# Patient Record
Sex: Male | Born: 1965 | Race: White | Hispanic: No | Marital: Married | Smoking: Former smoker
Health system: Southern US, Community
[De-identification: ages and names within clinical notes are randomized; demographics above are authoritative.]

## PROBLEM LIST (undated history)

## (undated) DIAGNOSIS — I209 Angina pectoris, unspecified: Secondary | ICD-10-CM

## (undated) DIAGNOSIS — I219 Acute myocardial infarction, unspecified: Secondary | ICD-10-CM

## (undated) DIAGNOSIS — I420 Dilated cardiomyopathy: Secondary | ICD-10-CM

## (undated) DIAGNOSIS — E785 Hyperlipidemia, unspecified: Secondary | ICD-10-CM

## (undated) DIAGNOSIS — I10 Essential (primary) hypertension: Secondary | ICD-10-CM

## (undated) DIAGNOSIS — I25119 Atherosclerotic heart disease of native coronary artery with unspecified angina pectoris: Secondary | ICD-10-CM

## (undated) DIAGNOSIS — I2121 ST elevation (STEMI) myocardial infarction involving left circumflex coronary artery: Secondary | ICD-10-CM

## (undated) DIAGNOSIS — Z95810 Presence of automatic (implantable) cardiac defibrillator: Secondary | ICD-10-CM

## (undated) DIAGNOSIS — I739 Peripheral vascular disease, unspecified: Secondary | ICD-10-CM

## (undated) DIAGNOSIS — I469 Cardiac arrest, cause unspecified: Secondary | ICD-10-CM

## (undated) DIAGNOSIS — Z955 Presence of coronary angioplasty implant and graft: Secondary | ICD-10-CM

## (undated) DIAGNOSIS — F419 Anxiety disorder, unspecified: Secondary | ICD-10-CM

## (undated) DIAGNOSIS — M7750 Other enthesopathy of unspecified foot: Secondary | ICD-10-CM

## (undated) DIAGNOSIS — I5042 Chronic combined systolic (congestive) and diastolic (congestive) heart failure: Secondary | ICD-10-CM

## (undated) DIAGNOSIS — M1990 Unspecified osteoarthritis, unspecified site: Secondary | ICD-10-CM

## (undated) HISTORY — PX: CARDIAC CATHETERIZATION: SHX172

## (undated) HISTORY — PX: APPENDECTOMY: SHX54

---

## 1994-05-05 DIAGNOSIS — I219 Acute myocardial infarction, unspecified: Secondary | ICD-10-CM

## 1994-05-05 HISTORY — DX: Acute myocardial infarction, unspecified: I21.9

## 2003-11-11 ENCOUNTER — Emergency Department (HOSPITAL_COMMUNITY): Admission: EM | Admit: 2003-11-11 | Discharge: 2003-11-12 | Payer: Self-pay | Admitting: Emergency Medicine

## 2008-06-15 ENCOUNTER — Inpatient Hospital Stay (HOSPITAL_COMMUNITY): Admission: EM | Admit: 2008-06-15 | Discharge: 2008-06-20 | Payer: Self-pay | Admitting: Emergency Medicine

## 2008-06-16 ENCOUNTER — Encounter (INDEPENDENT_AMBULATORY_CARE_PROVIDER_SITE_OTHER): Payer: Self-pay | Admitting: Emergency Medicine

## 2008-06-22 ENCOUNTER — Encounter: Admission: RE | Admit: 2008-06-22 | Discharge: 2008-06-22 | Payer: Self-pay | Admitting: Internal Medicine

## 2008-06-29 ENCOUNTER — Encounter (HOSPITAL_COMMUNITY): Admission: RE | Admit: 2008-06-29 | Discharge: 2008-07-18 | Payer: Self-pay | Admitting: Cardiology

## 2008-08-16 ENCOUNTER — Encounter: Admission: RE | Admit: 2008-08-16 | Discharge: 2008-08-16 | Payer: Self-pay | Admitting: Cardiology

## 2008-08-23 ENCOUNTER — Ambulatory Visit (HOSPITAL_COMMUNITY): Admission: RE | Admit: 2008-08-23 | Discharge: 2008-08-23 | Payer: Self-pay | Admitting: Cardiology

## 2008-09-02 HISTORY — PX: CAROTID ENDARTERECTOMY: SUR193

## 2008-09-05 ENCOUNTER — Ambulatory Visit: Payer: Self-pay | Admitting: Vascular Surgery

## 2008-09-08 ENCOUNTER — Ambulatory Visit: Payer: Self-pay | Admitting: Vascular Surgery

## 2008-09-08 ENCOUNTER — Inpatient Hospital Stay (HOSPITAL_COMMUNITY): Admission: RE | Admit: 2008-09-08 | Discharge: 2008-09-09 | Payer: Self-pay | Admitting: Vascular Surgery

## 2008-09-08 ENCOUNTER — Encounter: Payer: Self-pay | Admitting: Vascular Surgery

## 2008-10-03 ENCOUNTER — Ambulatory Visit: Payer: Self-pay | Admitting: Vascular Surgery

## 2010-08-13 LAB — COMPREHENSIVE METABOLIC PANEL
Albumin: 4.4 g/dL (ref 3.5–5.2)
BUN: 15 mg/dL (ref 6–23)
CO2: 27 mEq/L (ref 19–32)
Calcium: 9.2 mg/dL (ref 8.4–10.5)
Creatinine, Ser: 0.8 mg/dL (ref 0.4–1.5)
GFR calc Af Amer: >60 mL/min (ref >60)
GFR calc non Af Amer: >60 mL/min (ref >60)
Glucose, Bld: 99 mg/dL (ref 70–99)
Total Bilirubin: 0.4 mg/dL (ref 0.3–1.2)

## 2010-08-13 LAB — CBC
HCT: 45.3 % (ref 39.0–52.0)
Hemoglobin: 15.6 g/dL (ref 13.0–17.0)
MCHC: 34.4 g/dL (ref 30.0–36.0)
MCV: 87.4 fL (ref 78.0–100.0)
RBC: 4.25 MIL/uL (ref 4.22–5.81)
RBC: 5.18 MIL/uL (ref 4.22–5.81)
WBC: 9.5 10*3/uL (ref 4.0–10.5)

## 2010-08-13 LAB — CROSSMATCH: ABO/RH(D): AB POS

## 2010-08-13 LAB — GLUCOSE, CAPILLARY
Glucose-Capillary: 153 mg/dL — ABNORMAL HIGH (ref 70–99)
Glucose-Capillary: 162 mg/dL — ABNORMAL HIGH (ref 70–99)
Glucose-Capillary: 165 mg/dL — ABNORMAL HIGH (ref 70–99)
Glucose-Capillary: 169 mg/dL — ABNORMAL HIGH (ref 70–99)
Glucose-Capillary: 170 mg/dL — ABNORMAL HIGH (ref 70–99)
Glucose-Capillary: 175 mg/dL — ABNORMAL HIGH (ref 70–99)

## 2010-08-13 LAB — URINE MICROSCOPIC-ADD ON

## 2010-08-13 LAB — BASIC METABOLIC PANEL
BUN: 8 mg/dL (ref 6–23)
Calcium: 8.5 mg/dL (ref 8.4–10.5)
GFR calc non Af Amer: >60 mL/min (ref >60)
Glucose, Bld: 189 mg/dL — ABNORMAL HIGH (ref 70–99)
Potassium: 4.1 mEq/L (ref 3.5–5.1)
Sodium: 138 mEq/L (ref 135–145)

## 2010-08-13 LAB — URINALYSIS, ROUTINE W REFLEX MICROSCOPIC
Hgb urine dipstick: NEGATIVE
Ketones, ur: NEGATIVE mg/dL
Nitrite: NEGATIVE
Protein, ur: NEGATIVE mg/dL
Urobilinogen, UA: 1 mg/dL (ref 0.0–1.0)

## 2010-08-13 LAB — ABO/RH: ABO/RH(D): AB POS

## 2010-08-14 LAB — GLUCOSE, CAPILLARY: Glucose-Capillary: 150 mg/dL — ABNORMAL HIGH (ref 70–99)

## 2010-08-15 LAB — GLUCOSE, CAPILLARY
Glucose-Capillary: 162 mg/dL — ABNORMAL HIGH (ref 70–99)
Glucose-Capillary: 166 mg/dL — ABNORMAL HIGH (ref 70–99)
Glucose-Capillary: 173 mg/dL — ABNORMAL HIGH (ref 70–99)

## 2010-08-20 LAB — CBC
HCT: 47.3 % (ref 39.0–52.0)
HCT: 38.9 % — ABNORMAL LOW (ref 39.0–52.0)
Hemoglobin: 16.6 g/dL (ref 13.0–17.0)
Hemoglobin: 13.6 g/dL (ref 13.0–17.0)
MCHC: 35.1 g/dL (ref 30.0–36.0)
MCHC: 35.4 g/dL (ref 30.0–36.0)
MCV: 84.4 fL (ref 78.0–100.0)
MCV: 84.8 fL (ref 78.0–100.0)
MCV: 85 fL (ref 78.0–100.0)
Platelets: 133 10*3/uL — ABNORMAL LOW (ref 150–400)
Platelets: 142 10*3/uL — ABNORMAL LOW (ref 150–400)
Platelets: 160 K/uL (ref 150–400)
RBC: 4.51 MIL/uL (ref 4.22–5.81)
RBC: 4.67 MIL/uL (ref 4.22–5.81)
RBC: 4.78 MIL/uL (ref 4.22–5.81)
RBC: 5.58 MIL/uL (ref 4.22–5.81)
RDW: 12.6 % (ref 11.5–15.5)
RDW: 12.2 % (ref 11.5–15.5)
WBC: 10.9 10*3/uL — ABNORMAL HIGH (ref 4.0–10.5)
WBC: 8.1 10*3/uL (ref 4.0–10.5)
WBC: 9.2 K/uL (ref 4.0–10.5)

## 2010-08-20 LAB — GLUCOSE, CAPILLARY
Glucose-Capillary: 140 mg/dL — ABNORMAL HIGH (ref 70–99)
Glucose-Capillary: 160 mg/dL — ABNORMAL HIGH (ref 70–99)
Glucose-Capillary: 181 mg/dL — ABNORMAL HIGH (ref 70–99)
Glucose-Capillary: 185 mg/dL — ABNORMAL HIGH (ref 70–99)
Glucose-Capillary: 193 mg/dL — ABNORMAL HIGH (ref 70–99)
Glucose-Capillary: 257 mg/dL — ABNORMAL HIGH (ref 70–99)
Glucose-Capillary: 258 mg/dL — ABNORMAL HIGH (ref 70–99)
Glucose-Capillary: 283 mg/dL — ABNORMAL HIGH (ref 70–99)
Glucose-Capillary: 287 mg/dL — ABNORMAL HIGH (ref 70–99)
Glucose-Capillary: 301 mg/dL — ABNORMAL HIGH (ref 70–99)
Glucose-Capillary: 304 mg/dL — ABNORMAL HIGH (ref 70–99)
Glucose-Capillary: 348 mg/dL — ABNORMAL HIGH (ref 70–99)
Glucose-Capillary: 239 mg/dL — ABNORMAL HIGH (ref 70–99)

## 2010-08-20 LAB — POCT I-STAT, CHEM 8
Glucose, Bld: 414 mg/dL — ABNORMAL HIGH (ref 70–99)
HCT: 52 % (ref 39.0–52.0)
Hemoglobin: 17.7 g/dL — ABNORMAL HIGH (ref 13.0–17.0)
Potassium: 4.1 mEq/L (ref 3.5–5.1)
Sodium: 137 mEq/L (ref 135–145)

## 2010-08-20 LAB — COMPREHENSIVE METABOLIC PANEL
ALT: 21 U/L (ref 0–53)
ALT: 28 U/L (ref 0–53)
AST: 14 U/L (ref 0–37)
AST: 17 U/L (ref 0–37)
Albumin: 3 g/dL — ABNORMAL LOW (ref 3.5–5.2)
Alkaline Phosphatase: 55 U/L (ref 39–117)
Alkaline Phosphatase: 49 U/L (ref 39–117)
BUN: 11 mg/dL (ref 6–23)
BUN: 10 mg/dL (ref 6–23)
CO2: 24 mEq/L (ref 19–32)
CO2: 25 mEq/L (ref 19–32)
CO2: 27 mEq/L (ref 19–32)
CO2: 30 mEq/L (ref 19–32)
Calcium: 8.1 mg/dL — ABNORMAL LOW (ref 8.4–10.5)
Chloride: 103 mEq/L (ref 96–112)
Chloride: 104 mEq/L (ref 96–112)
Chloride: 108 mEq/L (ref 96–112)
Chloride: 103 mEq/L (ref 96–112)
Creatinine, Ser: 0.62 mg/dL (ref 0.4–1.5)
GFR calc Af Amer: >60 mL/min (ref >60)
GFR calc Af Amer: >60 mL/min (ref >60)
GFR calc non Af Amer: >60 mL/min (ref >60)
GFR calc non Af Amer: >60 mL/min (ref >60)
GFR calc non Af Amer: >60 mL/min (ref >60)
GFR calc non Af Amer: >60 mL/min (ref >60)
Glucose, Bld: 227 mg/dL — ABNORMAL HIGH (ref 70–99)
Potassium: 4 mEq/L (ref 3.5–5.1)
Sodium: 134 mEq/L — ABNORMAL LOW (ref 135–145)
Sodium: 136 mEq/L (ref 135–145)
Total Bilirubin: 0.4 mg/dL (ref 0.3–1.2)
Total Bilirubin: 0.5 mg/dL (ref 0.3–1.2)
Total Bilirubin: 0.5 mg/dL (ref 0.3–1.2)
Total Bilirubin: 0.6 mg/dL (ref 0.3–1.2)
Total Protein: 5.5 g/dL — ABNORMAL LOW (ref 6.0–8.3)

## 2010-08-20 LAB — BASIC METABOLIC PANEL
BUN: 9 mg/dL (ref 6–23)
CO2: 26 mEq/L (ref 19–32)
Chloride: 105 mEq/L (ref 96–112)
Creatinine, Ser: 0.74 mg/dL (ref 0.4–1.5)
Glucose, Bld: 282 mg/dL — ABNORMAL HIGH (ref 70–99)

## 2010-08-20 LAB — LIPID PANEL
Cholesterol: 282 mg/dL — ABNORMAL HIGH (ref 0–200)
Total CHOL/HDL Ratio: 8.5 RATIO

## 2010-08-20 LAB — HEMOGLOBIN A1C: Hgb A1c MFr Bld: 13.1 % — ABNORMAL HIGH (ref 4.6–6.1)

## 2010-08-20 LAB — D-DIMER, QUANTITATIVE: D-Dimer, Quant: 0.26 ug/mL-FEU (ref 0.00–0.48)

## 2010-08-20 LAB — BRAIN NATRIURETIC PEPTIDE: Pro B Natriuretic peptide (BNP): <30 pg/mL (ref 0.0–100.0)

## 2010-08-20 LAB — CARDIAC PANEL(CRET KIN+CKTOT+MB+TROPI)
CK, MB: 0.8 ng/mL (ref 0.3–4.0)
Relative Index: INVALID (ref 0.0–2.5)
Relative Index: INVALID (ref 0.0–2.5)
Total CK: 59 U/L (ref 7–232)
Total CK: 87 U/L (ref 7–232)

## 2010-08-20 LAB — PROTIME-INR: Prothrombin Time: 12.7 seconds (ref 11.6–15.2)

## 2010-08-20 LAB — RAPID URINE DRUG SCREEN, HOSP PERFORMED
Benzodiazepines: NOT DETECTED
Cocaine: NOT DETECTED
Tetrahydrocannabinol: NOT DETECTED

## 2010-08-20 LAB — POCT CARDIAC MARKERS: CKMB, poc: <1 ng/mL — ABNORMAL LOW (ref 1.0–8.0)

## 2010-09-17 NOTE — Cardiovascular Report (Signed)
Douglas Carr, Douglas Carr           ACCOUNT NO.:  [id number]   MEDICAL RECORD NO.:  [id number]          PATIENT TYPE:  AMB   LOCATION:  SDS                          FACILITY:  [facility]   PHYSICIAN:  Vonna Kotyk R. Jacinto Halim, MD       DATE OF BIRTH:  04-18-1966   DATE OF PROCEDURE:  DATE OF DISCHARGE:  08/23/2008                            CARDIAC CATHETERIZATION   PROCEDURE PERFORMED:  1. Arch aortogram.  2. Selective internal carotid arteriogram, both intracranial and      extracranial.  3. Right vertebral arteriogram and left subclavian arteriogram.   Procedure performed by Dr. Yates Decamp, assisted by Dr. Nanetta Batty.   INDICATIONS:  Douglas Carr is a 45 year old gentleman with  known coronary artery disease.  He has triple severe small vessel  coronary artery disease which are diabetic vessels by cardiac  catheterization in February 2010 for unstable angina.  He is on medical  therapy only.  He continues to have mild exertional angina pectoris, and  he is on aggressive medical therapy.  Because of carotid bruit, he  underwent carotid Dopplers which had revealed a high-grade stenosis of  the right internal carotid artery and a moderate-sized left internal  carotid artery and an occluded left vertebral artery.  He is now brought  to the catheterization lab for cerebral arteriography to confirm the  severity of stenoses.   Arch aortogram:  Arch aortogram revealed a type A arch.   Right carotid artery:  The right external carotid artery was widely  patent with mild luminal irregularity.  The right internal carotid  artery showed a focal 95% high-grade stenoses.   Left carotid artery:  The left common carotid artery was widely patent  with mild luminal irregularity.  Left internal carotid artery at its  origin was widely patent with mild luminal irregularity; however, above  the angle of the jaw there was a napkin-ring-like 70% stenoses.   Intracerebral arteriogram of the carotid  artery.  The intracerebral  arteriogram of the carotid artery showed no evidence of any thrombotic  occlusions or aneurysms.  All the vessels appeared normal.   Right vertebral arteriogram:  Right vertebral arteriogram revealed  widely patent right vertebral artery.   The intracranial portion in the posterior circulation was not adequately  visualized because of nonselective engagement of the right vertebral  artery and hence cannot be completely evaluated.   Left vertebral arteriogram:  Left vertebral artery is occluded.  Left  subclavian arteriogram revealed retrograde filling of the left vertebral  artery.   IMPRESSION:  1. High-grade stenosis of the right internal carotid artery.  2. Napkin-ring-like 70% or higher percent stenosis in the left      internal carotid artery above the angle of the jaw, not amenable      for surgical revascularization.  3. Severe small vessel coronary artery disease in a diabetic patient.   RECOMMENDATIONS:  Perform outpatient stress Myoview.  If he has got  significant lateral wall ischemia because of inability to revascularize  his coronary because of small vessels, he will be considered a high risk  for carotid endarterectomy and may  be a carotid stenting candidate into  the right.  I will evaluate the cerebral anatomy with a Dr. Nanetta Batty and also Dr. Liliane Bade.  The left internal carotid artery may  need to be stented because of high-grade stenosis although albeit he is  asymptomatic.   As far as the vertebral artery is concerned, it appears to be widely  patent.  The right vertebral artery is widely patent.  Left vertebral  artery is occluded.  Medical therapy only is advised, and he is  presently asymptomatic from that aspect.   Continued aggressive risk modification is indicated.   TECHNIQUE OF PROCEDURE:  Under usual sterile precautions using a 5-  French right femoral artery access, a 5-French pigtail catheter was  advanced to  the arch of the aorta and arch aortogram was performed in  the LAO projection.   Using [employer] as a wire, I was able to advance a JB-1 catheter to  perform selective 4-vessel cerebral arteriography.  The catheter was  then pulled out of the body in the usual fashion.  The patient tolerated  the procedure.  No immediate complications were noted.      Cristy Hilts. Jacinto Halim, MD  Electronically Signed     Cristy Hilts. Jacinto Halim, MD  Electronically Signed    JRG/MEDQ  D:  08/23/2008  T:  08/24/2008  Job:  [id number]   cc:   [facility].

## 2010-09-17 NOTE — Consult Note (Signed)
NAMEALBERTA, Douglas Carr           ACCOUNT NO.:  [id number]   MEDICAL RECORD NO.:  [id number]          PATIENT TYPE:  AMB   LOCATION:  SDS                          FACILITY:  [facility]   PHYSICIAN:  Vonna Kotyk R. Jacinto Halim, MD       DATE OF BIRTH:  16-Mar-1966   DATE OF CONSULTATION:  08/22/2008  DATE OF DISCHARGE:                                 CONSULTATION   REASON FOR OFFICE VISIT:  Follow up of coronary artery disease and  follow-up of carotid duplex.   HISTORY:  Douglas Carr is a pleasant 45 year old gentleman with history  of posterior wall myocardial infarction in July 1996.  He has recently  undergone cardiac catheterization for unstable angina on June 15, 2008 and was found to have diffuse severe coronary artery disease.  He  was recommended for medical therapy only given severe diffuse nature of  his coronary disease.   He now denies any chest pain, shortness of breath, paroxysmal nocturnal  dyspnea or orthopnea.   REVIEW OF SYSTEMS:  He has diabetes which is uncontrolled but getting  better.  He does have erectile dysfunction.  He has no significant bowel  or bladder dysfunction.  There is no neurologic weakness.  No TIA or  other symptoms.   PRESENT MEDICATIONS:  Lantus 30 units subcu q.h.s., NovoLog  7 units  with meals, Imdur 60 mg p.o. daily, Crestor 20 mg p.o. daily, Ranexa  1000 grams p.o. b.i.d., aspirin 325 mg p.o. daily, metoprolol 25 mg p.o.  b.i.d., metformin 500 mg 2 tabs p.o. q.h.s., Zoloft 20 mg p.o. daily,  lisinopril 20 mg p.o. daily.   ALLERGIES:  NO KNOWN DRUG ALLERGIES.   PHYSICAL EXAMINATION:  He is well-built and obese.  He appears to be in  no acute distress.  His weight is 224 pounds with a BMI of 32.5, heart  rate is 72 beats per minute, respirations 14, blood pressure is 120/74  mmHg.  CARDIAC:  S1-S2 was normal without any gallops or murmur.  CHEST:  Clear.  ABDOMEN:  Soft.  EXTREMITIES:  No edema.  PERIPHERAL ARTERIAL EXAM:  He has 3+ carotids  with a very prominent  right carotid bruit more than left, 3+ femoral pulses, popliteal and  DP/PT were 2+ bilaterally.   Carotid duplex done on July 18, 2008 revealed a high-grade 70-99%  stenosis across the right internal carotid artery.  Mild disease of the  left system.  The peak velocity was 277 with a diastolic velocity of 117  cm/sec.   IMPRESSION:  1. Asymptomatic a high-grade right carotid artery stenosis by duplex      evaluation.  2. Coronary artery disease with diffuse disease which are diabetic      vessels.  He has a long segment 80% diagonal 1, 70% diagonal 2, 80%      obtuse marginal 1, and a 60% long obtuse marginal 2 disease with      mild disease in the right coronary artery with normal ejection      fraction.  3. Hyperlipidemia, controlled.  4. Erectile dysfunction.  5. Diabetes mellitus type 2, uncontrolled with  his getting better.   RECOMMENDATIONS:  I had a lengthy discussion with the patient.  Given  the fact that he has high-grade stenosis of the right internal carotid  artery, to confirm the severity of the stenosis, I have recommended that  we proceed with four-vessel cerebral angiography.  He understand there  is a percent risk of embolic complications, especially stroke with this  but not limited to these.  Infection, bleeding, and death have also been  explained to the patient.   The alternatives of CT angiography were discussed.  As the high grade  stenosis could old or underestimated, we have decided to proceed with  peripheral angiography directly.  This will be set up in a few days and  further recommendations to follow.  At this time, no changes in  medications have been done.      Douglas Hilts. Jacinto Halim, MD  Electronically Signed     JRG/MEDQ  D:  08/22/2008  T:  08/22/2008  Job:  [id number]

## 2010-09-17 NOTE — Cardiovascular Report (Signed)
Douglas Carr, HIPPE NO.:  [id number]   MEDICAL RECORD NO.:  [id number]          PATIENT TYPE:  INP   LOCATION:  2003                         FACILITY:  [facility]   PHYSICIAN:  Antonieta Iba, MD   DATE OF BIRTH:  1965/10/11   DATE OF PROCEDURE:  06/19/2008  DATE OF DISCHARGE:                            CARDIAC CATHETERIZATION   REASON FOR PROCEDURE:  Mr. Fedie is a very pleasant 45 year old  gentleman with a history of poorly controlled diabetes, long history of  smoking, hypertension, coronary artery disease with remote history of  myocardial infarction at age 41 (details uncertain), who presents with  stuttering chest pain.  It has been getting worse over the past several  weeks.  He is brought to the Cardiac Catheterization Lab for further  evaluation.   PROCEDURE IN DETAILS:  risks and benefits of the procedure were  discussed with Mr. Advincula and consent was obtained.  He was brought to  the Cardiac Catheterization Lab and prepped and draped in the usual  sterile fashion.  The modified Seldinger technique was used to engage  the right femoral artery.  A 6-French Judkins left #4 and Judkins right  #4 catheter were used to engage the left main and the RCA ostium  respectively.  Hand injections were used and cinematography recorded in  various positions.  The pigtail catheter was used to cross the aortic  valve into the LV and LV gram was recorded.  Pullback recorded gradients  cross the aortic valve.  The sheath was removed at the end of the case  and manual pressure given and hemostasis obtained.  No complications  were reported.   CORONARY ANATOMY:  Left main; Left main is a moderate-to-large size  vessel with no significant disease.  It bifurcates into the LAD and left  circumflex.   LAD; the left anterior descending is a moderate-to-large size vessel  that extends distally to the apex.  There are mild luminal  irregularities noted.  There are 3-4  small diagonal vessels noted.  Each  diagonal has moderate-to-severe disease diffusely, most notably in the  D1 and D2 vessel.   Left circumflex; the left circumflex is a moderate-sized vessel with at  least 2 obtuse marginal branches.  The OM-1 and OM-2 have moderate-to-  severe disease throughout the proximal to mid region that is diffuse in  nature.  Otherwise, the left circumflex has no notable focal significant  lesions.   Right coronary artery; it has mild disease in its proximal to mid region  estimated at 20-30%.  There is also some small disease, estimated at 40-  50% in its distal PD branches where it tapers.   LV gram shows normal LV systolic function, estimated greater than 55%.  No MR and no significant AS.   FINAL IMPRESSION:  There is severe diffuse disease of the marginal  branches off the circumflex as well as the diagonal vessels.  The  marginals and diagonals are small in caliber, likely secondary to long  history of poorly controlled diabetes.  Medical management is  recommended given that there is no 1 focal lesion to  fix.  His chest  pain is likely secondary to severe small vessel disease.  We have  strongly recommended him that he work diligently controlling his  diabetes, stay strictly on his medical regimens including a cholesterol  medication and follow up with a primary care physician as well as a  cardiologist and possibly an endocrinologist.  He will be started on  isosorbide mononitrate 60 mg daily in the hospital and be given  nitroglycerins p.r.n. to take for chest pain.  Other treatment options  include advancing his beta-blockers, even starting Ranexa though he has  commented that many of these medications are expensive to him and  difficult to afford.  He should work with his cardiologist to see if he  can  obtain some co-pay assistance cards, so he can stay on his medications.  He will follow up with one of the cardiologists at [facility] in [place] or in [place] in the next week for a  groin check following his catheterization.      Antonieta Iba, MD  Electronically Signed     TJG/MEDQ  D:  06/19/2008  T:  06/20/2008  Job:  [id number]

## 2010-09-17 NOTE — H&P (Signed)
Douglas Carr, Douglas NO.:  [id number]   MEDICAL RECORD NO.:  [id number]          PATIENT TYPE:  INP   LOCATION:  2003                         FACILITY:  [facility]   PHYSICIAN:  Maryla Morrow, MD        DATE OF BIRTH:  [birth year]   DATE OF ADMISSION:  06/15/2008  DATE OF DISCHARGE:                              HISTORY & PHYSICAL   PRIMARY CARE PHYSICIAN:  Unassigned.   CHIEF COMPLAINT:  Chest pain.   HISTORY OF PRESENT ILLNESS:  [name] is a 45 year old very pleasant  gentleman with remote history of myocardial infarction, CAD,  hypertension, diabetes, and medical noncompliance with tobacco abuse,  who presents to the ED today with sudden onset of chest pain before  suppertime while at rest.  The patient described the pain as substernal  in origin around 10/10 in intensity.  It is localized to the area  without any radiation and associated diaphoresis, but no nausea.  This  prompted him to come to the ED for further evaluation as the pain eased  up after taking his mother-in-law's nitroglycerin, aspirin.  The patient  otherwise claims that he denies any shortness of breath, cough,  congestion, lower extremity edema.  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  He currently  is not taking any medication and fortunately has quit smoking since  May 05, 2008.   CURRENT MEDICATIONS:  None.   ALLERGIES:  NO KNOWN DRUG ALLERGIES.   PAST MEDICAL HISTORY:  Significant for hypertension, diabetes in mother  side, and heart problems in the fathers side.   SOCIAL HISTORY:  The patient is a former smoker.  Quit May 05, 2008.  Denies any tobacco, alcohol, drug abuse.  He is married.  Lives with his  wife.  He is currently a Armed forces operational officer for [employer].   REVIEW OF SYSTEMS:  All pertinent positive and negative as noted in the  HPI, rest was negative for patient, complete 12-point review of systems  performed.   PAST SURGICAL HISTORY:  Negative.   FAMILY MEDICAL HISTORY:  negative for CAD or SCD.   PHYSICAL EXAMINATION:  VITAL SIGNS:  Temperature 98.6, blood pressure  206/119 which is down to 137/71.  Pulse is 97 per minute.  Respirations  16 per minute.  Blood pressure saturation 100% on room air.  HEENT:  Pupils equally round and reactive to light.  Extraocular  movements intact.  NECK:  No JVD.  No lymphadenopathy.  HEAD:  Atraumatic, normocephalic.  Oropharyngeal is clear.  CHEST:  Clear to auscultation bilaterally.  Equal expansion bilaterally.  Bilateral, there is evidence of clear lung sounds, unlabored breathing.  HEART:  Regular rate and rhythm.  S1 and S2 normal.  No murmur or  gallop.  ABDOMEN:  Soft and nontender, nondistended.  EXTREMITIES:  No edema, cyanosis, or clubbing.  Peripheral pulses are 2+  bilaterally.  Strength is 5/5 throughout.  Sensation is intact.  Speech  is normal.  SKIN:  No evidence of rash, or ulceration.  PSYCHIATRY:  Mood and affect normal.  LABORATORY DATA:  Pertinent labs include negative urine tox screen as  well as unremarkable CBC, negative cardiac enzymes, and chest x-ray  reveal lung nodules particularly in the right base.  If possible get a  scan, consider CT chest for further assessment.  A 12-EKG was normal  sinus rhythm with normal PR, QT intervals, as well as no evidence of any  ST-T changes.   ASSESSMENT AND PLAN:  1. Unstable angina with history of myocardial infarction and coronary      artery disease in the past.  2. Uncontrolled hypertension.  This is uncontrolled.  3. Insulin dependent diabetes mellitus.  4. History of tobacco abuse.  5. Possible metabolic syndrome.  6. Lung nodules on chest x-ray.   RECOMMENDATION:  We will cycle cardiac enzymes x2 sets and place the  patient on full-dose Lovenox, beta-blocker, ACE inhibitor, aspirin and  nitroglycerin.  We will also do 2D echocardiogram morning and place the   patient on IV insulin, start statin.  We will consider counseling  Cardiology in the a.m. for left heart cath, as the patient is at risk  for worsening coronary artery disease.  The patient may also need a CT  chest for full workup of his lung nodules, should diagnose on chest x-  ray.      Maryla Morrow, MD  Electronically Signed     AP/MEDQ  D:  06/16/2008  T:  06/16/2008  Job:  [id number]

## 2010-09-17 NOTE — Op Note (Signed)
NAMEMARQUAN, VOKES NO.:  [id number]   MEDICAL RECORD NO.:  [id number]          PATIENT TYPE:  INP   LOCATION:  3303                         FACILITY:  [facility]   PHYSICIAN:  Quita Skye. Hart Rochester, M.D.  DATE OF BIRTH:  10-28-1965   DATE OF PROCEDURE:  09/08/2008  DATE OF DISCHARGE:                               OPERATIVE REPORT   PREOPERATIVE DIAGNOSIS:  Severe right internal carotid stenosis -  asymptomatic.   POSTOPERATIVE DIAGNOSIS:  Severe right internal carotid stenosis -  asymptomatic.   OPERATION:  Right carotid endarterectomy with Dacron patch angioplasty.   SURGEON:  Quita Skye. Hart Rochester, MD   FIRST ASSISTANT:  Nurse.   ANESTHESIA:  General endotracheal.   BRIEF HISTORY:  This patient with severe coronary artery disease having  suffered a myocardial infarction 12 years ago now presents with evidence  of severe right carotid occlusive disease confirmed by duplex scanning.  He has mild carotid disease on the contralateral left side scheduled for  a right carotid endarterectomy for this is asymptomatic stenosis.  He is  not an operative candidate for his coronary artery disease but has good  ventricular function.   PROCEDURE IN DETAIL:  The patient was taken to the operating room and  placed in a supine position at which time satisfactory general  endotracheal anesthesia was administered.  Right neck was prepped with  Betadine scrub and solution and draped in routine sterile manner.  Incision was made along the anterior border of the sternocleidomastoid  muscle and carried down through subcutaneous tissue and platysma using  Bovie.  Common facial vein and external jugular veins were ligated with  3-0 silk ties and divided exposing the common internal and external  carotid arteries.  Care was taken not to injure the vagus or hypoglossal  nerves both of which were exposed.  There was a calcified  atherosclerotic plaque at the carotid bifurcation extending up  the  internal carotid artery about 3 cm and distal vessel appeared normal.  A  #10 shunt was prepared and the patient was heparinized.  Carotid vessels  were occluded with vascular clamps.  A longitudinal opening made in the  common carotid with #15 blade and extended up the internal carotid with  Potts scissors to a point distal to the disease.  The plaque was at  least 90-95% stenotic in severity and distal vessel appeared normal.  A  #10 shunt was inserted without difficulty reestablishing the flow in  about 2 minutes.  Standard endarterectomy was then performed using the  elevator and Potts scissors with eversion endarterectomy of the external  carotid.  Plaque feathered off distal internal carotid artery nicely not  requiring any tacking sutures.  The lumen was thoroughly irrigated with  heparin saline.  All loose debris was carefully removed and arteriotomy  was closed with a patch using continuous 6-0 Prolene.  Prior to  completion of the closure, shunt was removed after about 30 minutes  shunt time following antegrade and retrograde flushing.  Closure was  completed reestablishing the flow initially up external and up the  internal branch.  Carotid was occluded for  less  than 2 minutes for removal of shunt.  Protamine was then given to  reverse the heparin.  Following adequate hemostasis, the wound was  irrigated with saline and closed in layers with Vicryl in subcuticular  fashion.  Sterile dressing was applied.  The patient was taken to the  recovery room in satisfactory condition.      Quita Skye Hart Rochester, M.D.  Electronically Signed     JDL/MEDQ  D:  09/08/2008  T:  09/09/2008  Job:  [id number]

## 2010-09-17 NOTE — Assessment & Plan Note (Signed)
OFFICE VISIT   Douglas Carr, Douglas Carr  DOB:  January 10, 1966                                       10/03/2008  BJYNW#:[id number]   The patient is status post right carotid endarterectomy on May 7 for a  severe but asymptomatic right internal carotid stenosis.  The patient  also has significant coronary artery disease and is not an operative  candidate for that but has good left ventricular function.  He has done  well since his carotid surgery with no neurologic complications or  specific complaints.  He is swallowing well and has had no hoarseness.  Denies any hemispheric or nonhemispheric TIAs.  He is taking his aspirin  daily (325 mg).   On exam today blood pressure is 132/66, heart rate is 96, respirations  14.  Right neck incision has healed nicely.  Carotid pulses are 3+ with  no audible bruits.  Neurological:  Normal.   I think he is doing quite well and will return in 6 months for a  followup carotid duplex exam.  He has a 50% left internal carotid  stenosis which we will follow.  If he has any neurologic symptoms in the  interim he will be in touch with [place].   Quita Skye Hart Rochester, M.D.  Electronically Signed   JDL/MEDQ  D:  10/03/2008  T:  10/04/2008  Job:  [id number]

## 2010-09-17 NOTE — Discharge Summary (Signed)
Douglas Carr, Douglas Carr NO.:  [id number]   MEDICAL RECORD NO.:  [id number]          PATIENT TYPE:  INP   LOCATION:  3303                         FACILITY:  [facility]   PHYSICIAN:  Quita Skye. Hart Rochester, M.D.  DATE OF BIRTH:  07/01/1965   DATE OF ADMISSION:  09/08/2008  DATE OF DISCHARGE:  09/09/2008                               DISCHARGE SUMMARY   FINAL DISCHARGE DIAGNOSES:  1. Severe right internal carotid artery stenosis, asymptomatic.  2. Noninsulin-dependent diabetes mellitus.  3. Dyslipidemia.  4. Hypertension.   PROCEDURE PERFORMED:  On Sep 08, 2008, right carotid endarterectomy with  Dacron patch angioplasty closure by Dr. Hart Rochester.   COMPLICATIONS:  None.   CONDITION AT DISCHARGE:  Stable, improving.   DISCHARGE MEDICATIONS:  He is instructed to resume all previous  prescribed medications consisting of:  1. Ranexa 1000 mg p.o. b.i.d.  2. Metformin 500 mg p.o. at bedtime.  3. Metoprolol 25 mg p.o. b.i.d.  4. Crestor 20 mg p.o. daily.  5. Lisinopril 20 mg p.o. daily.  6. Zoloft 100 mg p.o. daily.  7. Aspirin 325 mg p.o. daily.  8. Lantus insulin 30 units subcu at bedtime.  9. Novolin insulin 7 units subcu with meals.  10.He is given a prescription for Percocet 5/325 one p.o. q.4 h.      p.r.n. pain, total #20 were given.   DISPOSITION:  He is being discharged home in stable condition with his  wounds healing well.  He is given careful instructions regarding the  care of his wounds and activity level.  He is given a return appointment  with Dr. Hart Rochester in 2 weeks for continuing followup.   BRIEF IDENTIFYING STATEMENT:  For complete details, please refer the  typed history and physical.  Briefly, this very pleasant 45 year old  gentleman was referred to Dr. Hart Rochester for asymptomatic narrowing of his  right internal carotid artery.  Upon Dr. Candie Chroman evaluation, determined  this to be a rather severe narrowing and he recommended right carotid  endarterectomy for  his stroke prevention.  Mr. Noll was informed of  the risks and benefits of the procedure and after careful consideration,  he elected to proceed with surgery.   HOSPITAL COURSE:  Preoperative workup was completed as an outpatient.  He was brought in through same-day surgery and underwent the  aforementioned right carotid endarterectomy.  For complete details,  please refer the typed operative report.  The procedure was without  complications.  He was returned to the Post Anesthesia Care Unit  extubated.  Following stabilization, he was transferred to a bed on a  surgical step-down unit.  He was observed  overnight.  The following morning, he was found to be stable.  His  tongue was midline.  He was neurologically intact.  His wounds were  healing well.  He was desirous of discharge home and he was discharged  in stable condition.      Wilmon Arms, PA      Quita Skye Hart Rochester, M.D.  Electronically Signed    KEL/MEDQ  D:  09/09/2008  T:  09/10/2008  Job:  [id number]  cc:   Quita Skye. Hart Rochester, M.D.

## 2010-09-17 NOTE — H&P (Signed)
HISTORY AND PHYSICAL EXAMINATION   Sep 05, 2008   Re:  Douglas Carr, Douglas Carr                 DOB:  12/29/1965   CHIEF COMPLAINT:  Severe right internal carotid stenosis - asymptomatic.   HISTORY OF PRESENT ILLNESS:  This 45 year old male patient with a long  history of coronary artery disease suffered a myocardial infarction in  1996.  He underwent angiography and apparently was found to have diffuse  small vessel disease.  He was relatively stable for the next 12-14 years  but developed some chest pain in February of 2010 and was again admitted  for unstable angina pectoris.  Cardiac cath revealed severe distal  disease not amenable to intervention.  He apparently had a Cardiolite  study performed 2 weeks ago which revealed good ventricular function and  no evidence of ischemia.  He has stable angina pectoris.  He was also  found to have significant carotid occlusive disease and a right carotid  angiogram revealed 95+ percent focal right internal carotid stenosis.  He has no history stroke, TIAs, amaurosis fugax, diplopia, blurred  vision or syncope.   PAST MEDICAL HISTORY:  1. Diabetes now insulin dependent since February of 2010.  2. Hypertension.  3. Hyperlipidemia.  4. Coronary artery disease.  Previous myocardial infarction now      stable.  5. Negative for stroke.   PAST SURGICAL HISTORY:  Appendectomy.   FAMILY HISTORY:  Positive for diabetes in his mother.  Negative for  coronary artery disease except in grandparents.  Negative for stroke.   SOCIAL HISTORY:  He is married, has three children and is a Sales promotion account executive.  Smokes a pack of cigarettes per day and has done so for 25+  years.  Does not use alcohol.   REVIEW OF SYSTEMS:  Positive for occasional chest discomfort, dyspnea on  exertion, lower extremity discomfort with walking, headaches, muscle  pain.   ALLERGIES:  None known.   MEDICATIONS:  1. Ranexa 1000 mg one in the morning and one  at night.  2. Metformin 500 mg two at night.  3. Metoprolol 25 mg one in the morning and one at night.  4. Crestor 20 mg one in the morning.  5. Lisinopril 20 mg one in the morning.  6. Zoloft 100 mg one in the morning.  7. Aspirin 325 mg one in the morning.  8. Lantus 30 units at night.  9. Novolin 7 units with meals.   PHYSICAL EXAM:  Vital signs:  Blood pressure is 129/82, heart rate is  88, respirations 14.  General:  He is a middle-aged male in no apparent  distress, alert and oriented x3.  Neck:  Supple.  3+ carotid pulses.  Soft bruit on the right.  Neurologic:  Normal.  No palpable adenopathy  in the neck.  Chest:  Clear to auscultation.  Cardiovascular:  Regular  rhythm.  No murmurs.  Abdomen:  Soft, nontender with no masses.  He has  3+ femoral and popliteal pulses bilaterally and well-perfused lower  extremities.   IMPRESSION:  1. Severe 99% right internal carotid stenosis - asymptomatic.  2. Coronary artery disease with small distal disease not amenable to      intervention currently stable with negative Cardiolite.   PLAN:  Is to admit the patient on May 7 for an elective right carotid  endarterectomy.  Risks and benefits have been thoroughly discussed with  the patient and he would  like to proceed.   Quita Skye Hart Rochester, M.D.  Electronically Signed   JDL/MEDQ  D:  09/05/2008  T:  09/06/2008  Job:  [id number]   cc:   Cristy Hilts. Jacinto Halim, MD  Ernestina Penna, M.D.

## 2010-09-17 NOTE — Procedures (Signed)
CAROTID DUPLEX EXAM   INDICATION:  Follow up carotid artery disease.   HISTORY:  Diabetes:  Yes.  Cardiac:  MI.  Hypertension:  Yes.  Smoking:  Yes.  Previous Surgery:  No.  CV History:  No.  Amaurosis Fugax No, Paresthesias No, Hemiparesis No.                                       RIGHT             LEFT  Brachial systolic pressure:         138               132  Brachial Doppler waveforms:         WNL               WNL  Vertebral direction of flow:        Possible occlusion  DUPLEX VELOCITIES (cm/sec)  CCA peak systolic                   127  ECA peak systolic                   306  ICA peak systolic                   505  ICA end diastolic                   208  PLAQUE MORPHOLOGY:                  Mixed  PLAQUE AMOUNT:                      Severe  PLAQUE LOCATION:                    ICA/ECA   IMPRESSION:  1. Limited right side only study.  2. Right ICA shows evidence of 80% to 99% stenosis.  3. Right ECA stenosis.  4. Right vertebral artery possible occlusion.   ___________________________________________  Quita Skye Hart Rochester, M.D.   AS/MEDQ  D:  09/05/2008  T:  09/05/2008  Job:  [id number]

## 2010-09-17 NOTE — Discharge Summary (Signed)
[name], Douglas Carr NO.:  [id number]   MEDICAL RECORD NO.:  [id number]          PATIENT TYPE:  INP   LOCATION:  2003                         FACILITY:  [facility]   PHYSICIAN:  Richarda Overlie, MD       DATE OF BIRTH:  [birth year]   DATE OF ADMISSION:  06/15/2008  DATE OF DISCHARGE:  06/20/2008                               DISCHARGE SUMMARY   DISCHARGE DIAGNOSES:  1. Coronary artery disease with severe diffuse disease.  2. Uncontrolled diabetes.  3. Hypertension.  4. Dyslipidemia.  5. Pulmonary nodules, likely granulomatous lesions.   PROCEDURES:  1. Cardiac catheterization on June 19, 2008, that showed severe      diffuse disease of the marginal branches of the circumflex, as well      as the diagonal vessels.  The marginals and the diagonals are small      in caliber, likely secondary to long history of poorly controlled      diabetes.  Medical management recommended as there is no one focal      fixed lesion.  2. CT scan of the chest with contrast shows old granulomatous disease      with calcified right middle lobe nodules and calcified splenic      granulomas, segmental atelectasis in the lower lung zones.  3. Chest x-ray showed lung nodules particularly at the right lung      base.  4. CT scan of the head without contrast showed no evidence of any      intracranial hemorrhage or brain edema.   SUBJECTIVE:  This is a 45 year old male with a remote history of  myocardial infarction, coronary artery disease, hypertension, diabetes,  medical noncompliance and tobacco abuse who presented to the ER with a  chief complaint of chest pain that developed at rest.  The pain is  substernal in origin, 10/10 in intensity.  It was localized to the  substernal area without radiation, but associated with diaphoresis.  The  patient's pain eased up after taking sublingual nitroglycerin and  aspirin.  Because of the patient's multiple risk factors, the patient  was  admitted for possible unstable angina, uncontrolled hypertension and  insulin-dependent diabetes.  A 2-D echocardiogram was done that showed  an ejection fraction of normal systolic function, no wall motion  abnormalities were seen, and the patient was found to moderate  concentric hypertrophy.  Because of the patient's multiple risk factors,  [facility] was consulted and the patient was  thought to be a candidate for cardiac cath.  The patient's cardiac cath  showed severe diffuse disease, not amendable to intervention, but strong  medical management was recommended.   Risk factor stratification was done and the patient's lipid panel was  checked that showed a hemoglobin. A lipid panel was checked that showed  an HDL of 33, triglycerides of 358, LDL of 177.  The patient's  hemoglobin A1c was found to be 13.1.  He was initiated on Lantus and  regular insulin.  The patient will be enrolled in our drug assistance  program and case management was help him secure his  diabetic  medications.  He is to follow up with [facility] in 5-7 days.  Follow-  up concerns; repeat CMET, lipid panel and CK in 8-12 weeks.   DISCHARGE MEDICATIONS:  1. Metoprolol 25 p.o. q.12 h.  2. Aspirin 325 p.o. daily.  3. Lantus 15 units at bedtime, regular 7 units subcu q.a.c.  4. Imdur 60 mg p.o. daily.  5. Lisinopril 10 mg p.o. daily.  6. Zocor 80 mg p.o. daily.      Richarda Overlie, MD  Electronically Signed     NA/MEDQ  D:  06/20/2008  T:  06/20/2008  Job:  [id number]

## 2011-05-06 HISTORY — PX: LUNG BIOPSY: SHX232

## 2011-05-06 HISTORY — PX: OTHER SURGICAL HISTORY: SHX169

## 2011-08-19 ENCOUNTER — Encounter (HOSPITAL_COMMUNITY): Payer: Self-pay | Admitting: Emergency Medicine

## 2011-08-19 ENCOUNTER — Emergency Department (HOSPITAL_COMMUNITY)
Admission: EM | Admit: 2011-08-19 | Discharge: 2011-08-19 | Disposition: A | Discharge status: Home / Self Care | Payer: BC Managed Care – PPO | Source: Home / Self Care | Attending: Family Medicine | Admitting: Family Medicine

## 2011-08-19 DIAGNOSIS — E119 Type 2 diabetes mellitus without complications: Secondary | ICD-10-CM

## 2011-08-19 DIAGNOSIS — G609 Hereditary and idiopathic neuropathy, unspecified: Secondary | ICD-10-CM

## 2011-08-19 DIAGNOSIS — G629 Polyneuropathy, unspecified: Secondary | ICD-10-CM

## 2011-08-19 HISTORY — DX: Essential (primary) hypertension: I10

## 2011-08-19 LAB — POCT URINALYSIS DIP (DEVICE)
Glucose, UA: 500 mg/dL — AB
Leukocytes, UA: NEGATIVE
Nitrite: NEGATIVE
Urobilinogen, UA: 0.2 mg/dL (ref 0.0–1.0)

## 2011-08-19 LAB — POCT I-STAT, CHEM 8
HCT: 45 % (ref 39.0–52.0)
Hemoglobin: 15.3 g/dL (ref 13.0–17.0)
Potassium: 3.8 mEq/L (ref 3.5–5.1)
Sodium: 136 mEq/L (ref 135–145)
TCO2: 25 mmol/L (ref 0–100)

## 2011-08-19 MED ORDER — HYDROCODONE-ACETAMINOPHEN 5-500 MG PO TABS: 1.0000 | ORAL_TABLET | Freq: Four times a day (QID) | ORAL | Status: AC | PRN

## 2011-08-19 NOTE — ED Notes (Signed)
Pt. Stated, I started having both my legs hurting 3 days ago when I got up, denies any injury.

## 2011-08-19 NOTE — Discharge Instructions (Signed)
This is not well-controlled. You need to restart your 70/30 insulin today. Can use 10 units in the morning and your regular 30 units in the evening until you see your primary care provider. My impression is that the pain in your legs is also related to poorly controlled diabetes causing what is called peripheral neuropathy. Take the prescribed medications for pain on until to discuss further chronic pain management with your primary care provider. Be aware that Vicodin can cause drowsiness and she should not take before driving or operating machinery. Your blood sugar is high above 300 today. You need to restart her insulin tonight and will need to go to the hospital emergency department if dizziness, headache, nausea, vomiting or any new symptoms.  Followup with your primary care provider tomorrow as planned.

## 2011-08-20 NOTE — ED Provider Notes (Signed)
History     CSN: [id number]  Arrival date & time 08/19/11  1918   First MD Initiated Contact with Patient 08/19/11 1923      Chief Complaint  Patient presents with  . Leg Pain    (Consider location/radiation/quality/duration/timing/severity/associated sxs/prior treatment) HPI Comments: 46 y/o smoker male with h/o poorly controlled diabetes, HTN, CAD and poor medication compliance here c/o dull bilateral lower leg pain for about 1 week worse in last 3 days. Works in [employer] but denies any injury or falls. Has had discomfort like tingling and numbness in both hands as well. His PCP recently changed his diabetes regime to 70/30 insuline once a day 30 at evening and he is not using novolog to cover his meals. Patient no checking his sugar at home admits to not taking/using any of his medications including insuline in last 3 days. Reports he is "tired of injecting every day" and that "stopped taking everything as was not feeling well". Denies chest pain, shortness of breath, dizziness, abdominal pain, nausea, vomiting or diarrhea.    Past Medical History  Diagnosis Date  . Diabetes mellitus   . Hypertension   . Coronary artery disease     History reviewed. No pertinent past surgical history.  No family history on file.  History  Substance Use Topics  . Smoking status: Current Everyday Smoker -- 2.0 packs/day    Types: Cigarettes  . Smokeless tobacco: Not on file  . Alcohol Use: 0.6 oz/week    1 Cans of beer per week      Review of Systems  Constitutional: Negative for fever, chills, diaphoresis and appetite change.  HENT: Negative for congestion and sore throat.   Respiratory: Negative for cough and shortness of breath.   Cardiovascular: Negative for chest pain and palpitations.       Ankle swelling intermitently  Gastrointestinal: Negative for nausea, vomiting, abdominal pain, diarrhea and abdominal distention.  Musculoskeletal: Positive for arthralgias.  Skin:  Negative for rash.  Neurological: Negative for dizziness and headaches.  Psychiatric/Behavioral: Negative for suicidal ideas.    Allergies  Review of patient's allergies indicates no known allergies.  Home Medications   Current Outpatient Rx  Name Route Sig Dispense Refill  . ASPIRIN 325 MG PO TABS Oral Take 325 mg by mouth daily.    Marland Kitchen CLOPIDOGREL BISULFATE 75 MG PO TABS Oral Take 25 mg by mouth daily.    Marland Kitchen ESCITALOPRAM OXALATE 20 MG PO TABS Oral Take 20 mg by mouth daily.    . INSULIN ASPART 100 UNIT/ML [place] SOLN Subcutaneous Inject 30 Units into the skin 3 (three) times daily before meals.    Marland Kitchen LISINOPRIL 20 MG PO TABS Oral Take 20 mg by mouth daily.    Marland Kitchen METOPROLOL TARTRATE 25 MG PO TABS Oral Take 25 mg by mouth 2 (two) times daily.    Marland Kitchen RANOLAZINE ER 500 MG PO TB12 Oral Take 1,000 mg by mouth 2 (two) times daily.    Marland Kitchen ROSUVASTATIN CALCIUM 20 MG PO TABS Oral Take 20 mg by mouth daily.    Marland Kitchen HYDROCODONE-ACETAMINOPHEN 5-500 MG PO TABS Oral Take 1 tablet by mouth every 6 (six) hours as needed for pain. 15 tablet 0    BP 134/81  Pulse 94  Temp(Src) 98.6 F (37 C) (Oral)  Resp 20  SpO2 99%  Physical Exam  Nursing note and vitals reviewed. Constitutional: He is oriented to person, place, and time. He appears well-developed and well-nourished. No distress.  HENT:  Head: Normocephalic  and atraumatic.  Mouth/Throat: No oropharyngeal exudate.  Eyes: Conjunctivae are normal. Pupils are equal, round, and reactive to light. No scleral icterus.  Neck: Neck supple. No JVD present.  Cardiovascular: Normal rate, regular rhythm and normal heart sounds.  Exam reveals no gallop and no friction rub.   No murmur heard.      Trace bilateral ankle swelling, more right than left. Decreased but present dorsal pedial and tibial posterior pulses bilaterally. No distal cyanosis.  Pulmonary/Chest: Breath sounds normal. No respiratory distress. He has no wheezes. He exhibits no tenderness.  Abdominal:  Bowel sounds are normal. He exhibits no distension. There is no tenderness.  Musculoskeletal:       Lower extremities: diffuse tenderness. No bruising. There is mild swelling of right ankle more than left. No focal peri malleolar tenderness bilaterally. Patient is weight bearing. No calf tenderness and negative Homans sign bilaterally. Gross superficial sensation is concerved bilateral.   Lymphadenopathy:    He has no cervical adenopathy.  Neurological: He is alert and oriented to person, place, and time. He has normal reflexes.  Skin: No rash noted.  Psychiatric:       Flat affect, impress depressed mood. Here with wife. Denies suicidal ideation.    ED Course  Procedures (including critical care time)  Labs Reviewed  POCT URINALYSIS DIP (DEVICE) - Abnormal; Notable for the following:    Glucose, UA 500 (*)    Ketones, ur TRACE (*)    Hgb urine dipstick TRACE (*)    All other components within normal limits  POCT I-STAT, CHEM 8 - Abnormal; Notable for the following:    Glucose, Bld 367 (*)    All other components within normal limits  LAB REPORT - SCANNED   No results found.   1. Peripheral neuropathy   2. Diabetes mellitus       MDM  Poorly controlled DM, smoker. Impress neuropathic pain peripherally, likely related to diabetes. Hyperglycemic and non compliant with medication regime. electrolytes and vital signs stable. No vomiting or diarrhea. reccommended hydration with non sugary drinks and to restart his medications including 70/30 insuline 30 units this evening and add a 10 units in am until gets to be seen by his primary doctor. Wife states she will call PCP office in am for same day appointment. Prescribed Vicodin for pain.  Asked to go to the emergency department if new or worsening symptoms.         Sharin Grave, MD 08/20/11 [phone]

## 2011-10-07 ENCOUNTER — Encounter (HOSPITAL_COMMUNITY): Payer: Self-pay | Admitting: Pharmacy Technician

## 2011-10-09 ENCOUNTER — Other Ambulatory Visit: Payer: Self-pay | Admitting: Orthopedic Surgery

## 2011-10-13 ENCOUNTER — Encounter (HOSPITAL_COMMUNITY): Payer: Self-pay

## 2011-10-13 ENCOUNTER — Encounter (HOSPITAL_COMMUNITY)
Admission: RE | Admit: 2011-10-13 | Discharge: 2011-10-13 | Disposition: A | Discharge status: Home / Self Care | Payer: BC Managed Care – PPO | Source: Ambulatory Visit | Attending: Orthopedic Surgery | Admitting: Orthopedic Surgery

## 2011-10-13 ENCOUNTER — Ambulatory Visit (HOSPITAL_COMMUNITY)
Admission: RE | Admit: 2011-10-13 | Discharge: 2011-10-13 | Disposition: A | Discharge status: Home / Self Care | Payer: BC Managed Care – PPO | Source: Ambulatory Visit | Attending: Orthopedic Surgery | Admitting: Orthopedic Surgery

## 2011-10-13 DIAGNOSIS — Z01812 Encounter for preprocedural laboratory examination: Secondary | ICD-10-CM | POA: Insufficient documentation

## 2011-10-13 DIAGNOSIS — Z01810 Encounter for preprocedural cardiovascular examination: Secondary | ICD-10-CM | POA: Insufficient documentation

## 2011-10-13 DIAGNOSIS — J8410 Pulmonary fibrosis, unspecified: Secondary | ICD-10-CM | POA: Insufficient documentation

## 2011-10-13 DIAGNOSIS — R911 Solitary pulmonary nodule: Secondary | ICD-10-CM | POA: Insufficient documentation

## 2011-10-13 HISTORY — DX: Anxiety disorder, unspecified: F41.9

## 2011-10-13 HISTORY — DX: Unspecified osteoarthritis, unspecified site: M19.90

## 2011-10-13 HISTORY — DX: Angina pectoris, unspecified: I20.9

## 2011-10-13 HISTORY — DX: Peripheral vascular disease, unspecified: I73.9

## 2011-10-13 HISTORY — DX: Hyperlipidemia, unspecified: E78.5

## 2011-10-13 HISTORY — DX: Acute myocardial infarction, unspecified: I21.9

## 2011-10-13 LAB — BASIC METABOLIC PANEL
Chloride: 102 mEq/L (ref 96–112)
Creatinine, Ser: 0.67 mg/dL (ref 0.50–1.35)
GFR calc Af Amer: >90 mL/min (ref >90)
GFR calc non Af Amer: >90 mL/min (ref >90)
Potassium: 4.6 mEq/L (ref 3.5–5.1)

## 2011-10-13 LAB — CBC
MCHC: 33.6 g/dL (ref 30.0–36.0)
MCV: 83.9 fL (ref 78.0–100.0)
Platelets: 173 10*3/uL (ref 150–400)
RDW: 12.8 % (ref 11.5–15.5)
WBC: 8.5 10*3/uL (ref 4.0–10.5)

## 2011-10-13 LAB — SURGICAL PCR SCREEN: Staphylococcus aureus: NEGATIVE

## 2011-10-13 NOTE — Patient Instructions (Signed)
YOUR SURGERY IS SCHEDULED ON:  WED  6/12  AT 1:00 PM  REPORT TO [facility] AT  11:00 AM      PHONE # FOR SHORT STAY IS [phone]  DO NOT EAT ANYTHING AFTER MIDNIGHT THE NIGHT BEFORE YOUR SURGERY.   NO FOOD, NO CHEWING GUM, NO MINTS, NO CANDIES, NO CHEWING TOBACCO.  YOU MAY HAVE CLEAR LIQUIDS TO DRINK FROM MIDNIGHT UNTIL 7:00 AM THE DAY OF YOUR SURGERY--WATER, SODA.  NOTHING AFTER 7:00 AM.  PLEASE TAKE THE FOLLOWING MEDICATIONS THE AM OF YOUR SURGERY WITH A FEW SIPS OF WATER:  LEXAPRO, METOPROLOL, RANEXA    IF YOU USE INHALERS--USE YOUR INHALERS THE AM OF YOUR SURGERY AND BRING INHALERS TO THE HOSPITAL -TAKE TO SURGERY.    IF YOU ARE DIABETIC:  DO NOT TAKE ANY DIABETIC MEDICATIONS THE AM OF YOUR SURGERY.  IF YOU TAKE INSULIN IN THE EVENINGS--PLEASE ONLY TAKE 1/2 NORMAL EVENING DOSE THE NIGHT BEFORE YOUR SURGERY.  NO INSULIN THE AM OF YOUR SURGERY.  IF YOU HAVE SLEEP APNEA AND USE CPAP OR BIPAP--PLEASE BRING THE MASK --NOT THE MACHINE-NOT THE TUBING   -JUST THE MASK. DO NOT BRING VALUABLES, MONEY, CREDIT CARDS.  CONTACT LENS, DENTURES / PARTIALS, GLASSES SHOULD NOT BE WORN TO SURGERY AND IN MOST CASES-HEARING AIDS WILL NEED TO BE REMOVED.  BRING YOUR GLASSES CASE, ANY EQUIPMENT NEEDED FOR YOUR CONTACT LENS. FOR PATIENTS ADMITTED TO THE HOSPITAL--CHECK OUT TIME THE DAY OF DISCHARGE IS 11:00 AM.  ALL INPATIENT ROOMS ARE PRIVATE - WITH BATHROOM, TELEPHONE, TELEVISION AND WIFI INTERNET. IF YOU ARE BEING DISCHARGED THE SAME DAY OF YOUR SURGERY--YOU CAN NOT DRIVE YOURSELF HOME--AND SHOULD NOT GO HOME ALONE BY TAXI OR BUS.  NO DRIVING OR OPERATING MACHINERY FOR 24 HOURS FOLLOWING ANESTHESIA / PAIN MEDICATIONS.                            SPECIAL INSTRUCTIONS:  CHLORHEXIDINE SOAP SHOWER (other brand names are Betasept and Hibiclens ) PLEASE SHOWER WITH CHLORHEXIDINE THE NIGHT BEFORE YOUR SURGERY AND THE AM OF YOUR SURGERY. DO NOT USE CHLORHEXIDINE ON YOUR FACE OR PRIVATE  AREAS--YOU MAY USE YOUR NORMAL SOAP THOSE AREAS AND YOUR NORMAL SHAMPOO.  WOMEN SHOULD AVOID SHAVING UNDER ARMS AND SHAVING LEGS 48 HOURS BEFORE USING CHLORHEXIDINE TO AVOID SKIN IRRITATION.  DO NOT USE IF ALLERGIC TO CHLORHEXIDINE.  PLEASE READ OVER ANY  FACT SHEETS THAT YOU WERE GIVEN: MRSA INFORMATION

## 2011-10-13 NOTE — Pre-Procedure Instructions (Signed)
PT HAS CARDIAC CLEARANCE FROM DR. J. BERRY- NOTE OF CLEARANCE ON PT'S CHART -ALONG WITH PT'S LAST CARDIOLOGIST OFFICE VISIT 05/21/10, EKG, LAST ECHO REPORT 06/16/08, LAST NUCLEAR STRESS TEST REPORT 08/29/2008. EKG, CXR, CBC, BMET WERE DONE TODAY - PREOP - AT [facility]. PT STATES HE STOPPED HIS PLAVIX ABOUT 2 MONTHS AGO- PRESCRIPTION RAN OUT.  PT ENCOURAGED TO CHECK ON RESUMING PLAVIX AT SOME POINT AFTER HIS SURGERY.

## 2011-10-13 NOTE — Pre-Procedure Instructions (Signed)
PT'S PREOP CXR REPORT ABNORMAL - RADIOLOGY TO CALL RESULTS TO DR. APLINGTON'S OFFICE.  I FAXED A COPY OF THE CXR REPORT TO DR. APLINGTON'S OFFICE.

## 2011-10-14 ENCOUNTER — Ambulatory Visit
Admission: RE | Admit: 2011-10-14 | Discharge: 2011-10-14 | Disposition: A | Discharge status: Home / Self Care | Payer: BC Managed Care – PPO | Source: Ambulatory Visit | Attending: Orthopedic Surgery | Admitting: Orthopedic Surgery

## 2011-10-14 ENCOUNTER — Other Ambulatory Visit: Payer: Self-pay | Admitting: Orthopedic Surgery

## 2011-10-14 DIAGNOSIS — IMO0002 Reserved for concepts with insufficient information to code with codable children: Secondary | ICD-10-CM

## 2011-10-14 MED ORDER — IOHEXOL 300 MG/ML  SOLN
75.0000 mL | Freq: Once | INTRAMUSCULAR | Status: AC | PRN
  Administered 2011-10-14: 75 mL via INTRAVENOUS

## 2011-10-15 ENCOUNTER — Ambulatory Visit (HOSPITAL_COMMUNITY)
Admission: RE | Admit: 2011-10-15 | Payer: BC Managed Care – PPO | Source: Ambulatory Visit | Admitting: Orthopedic Surgery

## 2011-10-15 ENCOUNTER — Other Ambulatory Visit: Payer: BC Managed Care – PPO

## 2011-10-15 ENCOUNTER — Encounter (HOSPITAL_COMMUNITY): Admission: RE | Payer: Self-pay | Source: Ambulatory Visit

## 2011-10-15 SURGERY — ARTHROTOMY
Anesthesia: General | Site: Ankle | Laterality: Right

## 2011-10-16 ENCOUNTER — Encounter: Payer: Self-pay | Admitting: Emergency Medicine

## 2011-10-16 ENCOUNTER — Ambulatory Visit (INDEPENDENT_AMBULATORY_CARE_PROVIDER_SITE_OTHER): Payer: BC Managed Care – PPO | Admitting: Emergency Medicine

## 2011-10-16 VITALS — BP 128/88 | HR 97 | Temp 97.6°F | Ht 69.0 in | Wt 206.6 lb

## 2011-10-16 DIAGNOSIS — R918 Other nonspecific abnormal finding of lung field: Secondary | ICD-10-CM

## 2011-10-16 NOTE — Assessment & Plan Note (Signed)
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 The ground glass areas are less clear to me. Could be sarcoid, atypical infxn, unusual presentation of malignancy. I believe he needs PPD, EBUS + FOB (possibly w ENB) - will place PPD - will arrange for EBUS/ENB asap

## 2011-10-16 NOTE — Progress Notes (Signed)
Subjective:    Patient ID: Douglas Carr, male    DOB: 03/01/1966, 46 y.o.   MRN: [id number]  HPI 46 yo smoker with hx HTN, CAD, DM1. Referred by Dr  Appleton when a pre-op CXR showed pulm nodules. A CT scan was done that shows stable old calcified nodules RUL, but also B scattered ground glass opacities. No symptoms. Occasional CP but no cough, no fevers, chills, etc   Marines, travel to [place] and [place], [place] No black mold, no birds.   Past Medical History  Diagnosis Date  . Hypertension   . Coronary artery disease   . Diabetes mellitus     ON ORAL MEDICATION AND INSULIN  . Hyperlipidemia   . Myocardial infarction 1996  . Anginal pain   . Peripheral vascular disease     HAS LEFT CAROTID ARTERY STENOSIS   AND IS S/P RIGHT CAROTID ENDARTERECTOMY 2010  . Anxiety   . Arthritis     BIL KNEE PAIN AND BIL ANKLE PAIN     No family history on file.   History   Social History  . Marital Status: Married    Spouse Name: N/A    Number of Children: N/A  . Years of Education: N/A   Occupational History  . Not on file.   Social History Main Topics  . Smoking status: Current Everyday Smoker -- 2.0 packs/day for 22 years    Types: Cigarettes  . Smokeless tobacco: Never Used  . Alcohol Use: 0.0 oz/week    2-3 Cans of beer per week  . Drug Use: No  . Sexually Active:    Other Topics Concern  . Not on file   Social History Narrative  . No narrative on file     No Known Allergies   Outpatient Prescriptions Prior to Visit  Medication Sig Dispense Refill  . ALPRAZolam (XANAX PO) Take by mouth. PT TAKES 1/2 PILL UP TO 4 TIMES A DAY IF NEED FOR ANXIETY--BUT ALWAYS TAKES 1/2 PILL AT BEDTIME 0.5 MG PER [facility]      . aspirin 325 MG tablet Take 325 mg by mouth daily.      . escitalopram (LEXAPRO) 20 MG tablet Take 20 mg by mouth daily. IN AM FOR ANXIETY      . HYDROcodone-acetaminophen (NORCO) 5-325 MG per tablet Take 1 tablet by mouth every 6 (six) hours as needed.  pain      . ibuprofen (ADVIL,MOTRIN) 200 MG tablet Take 400 mg by mouth every 6 (six) hours as needed. pain      . insulin glargine (LANTUS) 100 UNIT/ML injection Inject 20-25 Units into the skin 2 (two) times daily. Takes 20 units in the morning and 25 units at night      . lisinopril (PRINIVIL,ZESTRIL) 20 MG tablet Take 20 mg by mouth daily. IN AM      . meloxicam (MOBIC) 15 MG tablet Take 15 mg by mouth daily. IN AM      . metFORMIN (GLUCOPHAGE) 1000 MG tablet Take 1,000 mg by mouth 2 (two) times daily with a meal.      . metoprolol tartrate (LOPRESSOR) 25 MG tablet Take 25 mg by mouth 2 (two) times daily.      . ranolazine (RANEXA) 1000 MG SR tablet Take 1,000 mg by mouth 2 (two) times daily.      . rosuvastatin (CRESTOR) 40 MG tablet Take 40 mg by mouth daily. AT BEDTIME      . clopidogrel (PLAVIX) 75 MG   tablet Take 75 mg by mouth daily. PRESCRIPTION RAN OUT ABOUT 2 MONTHS AGO  --? April 2013 AND PT DID NOT REFILL PRESCRIPTION          Review of Systems  Constitutional: Negative for fever and unexpected weight change.  HENT: Negative for ear pain, nosebleeds, congestion, sore throat, rhinorrhea, sneezing, trouble swallowing, dental problem, postnasal drip and sinus pressure.   Eyes: Negative for redness and itching.  Respiratory: Positive for chest tightness and shortness of breath. Negative for cough and wheezing.   Cardiovascular: Negative for palpitations and leg swelling.  Gastrointestinal: Negative for nausea and vomiting.  Genitourinary: Negative for dysuria.  Musculoskeletal: Negative for joint swelling.  Skin: Negative for rash.  Neurological: Negative for headaches.  Hematological: Does not bruise/bleed easily.  Psychiatric/Behavioral: Positive for dysphoric mood. The patient is nervous/anxious.        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 The ground glass areas are less clear to me. Could be sarcoid, atypical infxn, unusual presentation of malignancy. I believe he needs PPD, EBUS + FOB (possibly w ENB) - will place PPD - will arrange for EBUS/ENB asap    

## 2011-10-16 NOTE — Patient Instructions (Addendum)
We will place a TB test tomorrow, to be read on Monday We will arrange for a bronchoscopy at [facility] to biopsy your lymph nodes and pulmonary nodules.

## 2011-10-17 ENCOUNTER — Other Ambulatory Visit: Payer: Self-pay | Admitting: Emergency Medicine

## 2011-10-17 ENCOUNTER — Encounter (HOSPITAL_COMMUNITY): Payer: Self-pay | Admitting: Pharmacy Technician

## 2011-10-22 ENCOUNTER — Encounter (HOSPITAL_COMMUNITY): Payer: Self-pay | Admitting: *Deleted

## 2011-10-22 MED ORDER — POVIDONE-IODINE 7.5 % EX SOLN
Freq: Once | CUTANEOUS | Status: DC
  Filled 2011-10-22: qty 118

## 2011-10-23 ENCOUNTER — Encounter (HOSPITAL_COMMUNITY): Payer: Self-pay | Admitting: Emergency Medicine

## 2011-10-23 ENCOUNTER — Encounter (HOSPITAL_COMMUNITY): Payer: Self-pay | Admitting: Certified Registered Nurse Anesthetist

## 2011-10-23 ENCOUNTER — Ambulatory Visit (HOSPITAL_COMMUNITY): Payer: BC Managed Care – PPO

## 2011-10-23 ENCOUNTER — Encounter (HOSPITAL_COMMUNITY)
Admission: RE | Disposition: A | Discharge status: Home / Self Care | Payer: Self-pay | Source: Ambulatory Visit | Attending: Emergency Medicine

## 2011-10-23 ENCOUNTER — Ambulatory Visit (HOSPITAL_COMMUNITY)
Admission: RE | Admit: 2011-10-23 | Discharge: 2011-10-23 | Disposition: A | Discharge status: Home / Self Care | Payer: BC Managed Care – PPO | Source: Ambulatory Visit | Attending: Emergency Medicine | Admitting: Emergency Medicine

## 2011-10-23 ENCOUNTER — Ambulatory Visit (HOSPITAL_COMMUNITY): Payer: BC Managed Care – PPO | Admitting: Certified Registered Nurse Anesthetist

## 2011-10-23 ENCOUNTER — Encounter (HOSPITAL_COMMUNITY): Payer: Self-pay

## 2011-10-23 DIAGNOSIS — R599 Enlarged lymph nodes, unspecified: Secondary | ICD-10-CM

## 2011-10-23 DIAGNOSIS — E119 Type 2 diabetes mellitus without complications: Secondary | ICD-10-CM | POA: Insufficient documentation

## 2011-10-23 DIAGNOSIS — I252 Old myocardial infarction: Secondary | ICD-10-CM | POA: Insufficient documentation

## 2011-10-23 DIAGNOSIS — I2510 Atherosclerotic heart disease of native coronary artery without angina pectoris: Secondary | ICD-10-CM | POA: Insufficient documentation

## 2011-10-23 DIAGNOSIS — R918 Other nonspecific abnormal finding of lung field: Secondary | ICD-10-CM

## 2011-10-23 DIAGNOSIS — F411 Generalized anxiety disorder: Secondary | ICD-10-CM | POA: Insufficient documentation

## 2011-10-23 DIAGNOSIS — I10 Essential (primary) hypertension: Secondary | ICD-10-CM | POA: Insufficient documentation

## 2011-10-23 LAB — COMPREHENSIVE METABOLIC PANEL
AST: 12 U/L (ref 0–37)
Albumin: 3.8 g/dL (ref 3.5–5.2)
Alkaline Phosphatase: 74 U/L (ref 39–117)
BUN: 18 mg/dL (ref 6–23)
Chloride: 101 mEq/L (ref 96–112)
Potassium: 4.7 mEq/L (ref 3.5–5.1)
Sodium: 138 mEq/L (ref 135–145)
Total Bilirubin: 0.2 mg/dL — ABNORMAL LOW (ref 0.3–1.2)
Total Protein: 7 g/dL (ref 6.0–8.3)

## 2011-10-23 LAB — APTT: aPTT: 26 seconds (ref 24–37)

## 2011-10-23 LAB — CBC
HCT: 43.7 % (ref 39.0–52.0)
MCHC: 34.1 g/dL (ref 30.0–36.0)
Platelets: 162 10*3/uL (ref 150–400)
RDW: 12.9 % (ref 11.5–15.5)
WBC: 8.9 10*3/uL (ref 4.0–10.5)

## 2011-10-23 LAB — PROTIME-INR: Prothrombin Time: 12.7 seconds (ref 11.6–15.2)

## 2011-10-23 LAB — GLUCOSE, CAPILLARY: Glucose-Capillary: 200 mg/dL — ABNORMAL HIGH (ref 70–99)

## 2011-10-23 SURGERY — BRONCHOSCOPY, WITH EBUS
Anesthesia: General | Site: Mouth | Laterality: Bilateral | Wound class: Clean Contaminated

## 2011-10-23 SURGERY — ELECTROMAGNETIC NAVIGATION BRONCHOSCOPY
Anesthesia: General | Laterality: Bilateral

## 2011-10-23 MED ORDER — LACTATED RINGERS IV SOLN
INTRAVENOUS | Status: DC | PRN
  Administered 2011-10-23 (×2): via INTRAVENOUS

## 2011-10-23 MED ORDER — METOPROLOL TARTRATE 12.5 MG HALF TABLET
ORAL_TABLET | ORAL | Status: AC
  Filled 2011-10-23: qty 2

## 2011-10-23 MED ORDER — VECURONIUM BROMIDE 10 MG IV SOLR
INTRAVENOUS | Status: DC | PRN
  Administered 2011-10-23: 1 mg via INTRAVENOUS

## 2011-10-23 MED ORDER — ROCURONIUM BROMIDE 100 MG/10ML IV SOLN
INTRAVENOUS | Status: DC | PRN
  Administered 2011-10-23: 50 mg via INTRAVENOUS

## 2011-10-23 MED ORDER — MIDAZOLAM HCL 5 MG/5ML IJ SOLN
INTRAMUSCULAR | Status: DC | PRN
  Administered 2011-10-23: 2 mg via INTRAVENOUS

## 2011-10-23 MED ORDER — PROPOFOL 10 MG/ML IV EMUL
INTRAVENOUS | Status: DC | PRN
  Administered 2011-10-23: 200 mg via INTRAVENOUS

## 2011-10-23 MED ORDER — FENTANYL CITRATE 0.05 MG/ML IJ SOLN
INTRAMUSCULAR | Status: DC | PRN
  Administered 2011-10-23: 50 ug via INTRAVENOUS
  Administered 2011-10-23: 25 ug via INTRAVENOUS

## 2011-10-23 MED ORDER — 0.9 % SODIUM CHLORIDE (POUR BTL) OPTIME
TOPICAL | Status: DC | PRN
  Administered 2011-10-23: 1000 mL

## 2011-10-23 MED ORDER — METOPROLOL TARTRATE 25 MG PO TABS
25.0000 mg | ORAL_TABLET | Freq: Once | ORAL | Status: AC
  Administered 2011-10-23: 25 mg via ORAL

## 2011-10-23 MED ORDER — LIDOCAINE HCL (CARDIAC) 20 MG/ML IV SOLN
INTRAVENOUS | Status: DC | PRN
  Administered 2011-10-23: 100 mg via INTRAVENOUS

## 2011-10-23 MED ORDER — NEOSTIGMINE METHYLSULFATE 1 MG/ML IJ SOLN
INTRAMUSCULAR | Status: DC | PRN
  Administered 2011-10-23: 5 mg via INTRAVENOUS

## 2011-10-23 MED ORDER — EPHEDRINE SULFATE 50 MG/ML IJ SOLN
INTRAMUSCULAR | Status: DC | PRN
  Administered 2011-10-23: 5 mg via INTRAVENOUS
  Administered 2011-10-23: 10 mg via INTRAVENOUS
  Administered 2011-10-23 (×4): 5 mg via INTRAVENOUS

## 2011-10-23 MED ORDER — HYDROMORPHONE HCL PF 1 MG/ML IJ SOLN: 0.2500 mg | INTRAMUSCULAR | Status: DC | PRN

## 2011-10-23 MED ORDER — MIDAZOLAM HCL 2 MG/2ML IJ SOLN: 1.0000 mg | INTRAMUSCULAR | Status: DC | PRN

## 2011-10-23 MED ORDER — FENTANYL CITRATE 0.05 MG/ML IJ SOLN: 50.0000 ug | INTRAMUSCULAR | Status: DC | PRN

## 2011-10-23 MED ORDER — LORAZEPAM 2 MG/ML IJ SOLN: 1.0000 mg | Freq: Once | INTRAMUSCULAR | Status: DC | PRN

## 2011-10-23 MED ORDER — ONDANSETRON HCL 4 MG/2ML IJ SOLN
INTRAMUSCULAR | Status: DC | PRN
  Administered 2011-10-23: 4 mg via INTRAVENOUS

## 2011-10-23 MED ORDER — GLYCOPYRROLATE 0.2 MG/ML IJ SOLN
INTRAMUSCULAR | Status: DC | PRN
  Administered 2011-10-23: 0.1 mg via INTRAVENOUS
  Administered 2011-10-23: .9 mg via INTRAVENOUS
  Administered 2011-10-23 (×2): 0.1 mg via INTRAVENOUS

## 2011-10-23 MED ORDER — PHENYLEPHRINE HCL 10 MG/ML IJ SOLN
INTRAMUSCULAR | Status: DC | PRN
  Administered 2011-10-23 (×2): 40 ug via INTRAVENOUS

## 2011-10-23 SURGICAL SUPPLY — 37 items
BRUSH CYTOL CELLEBRITY 1.5X140 (MISCELLANEOUS) IMPLANT
BRUSH SUPERTRAX BIOPSY (INSTRUMENTS) ×1 IMPLANT
BRUSH SUPERTRAX NDL-TIP CYTO (INSTRUMENTS) ×1 IMPLANT
CANISTER SUCTION 2500CC (MISCELLANEOUS) ×2 IMPLANT
CHANNEL WORK EXTEND EDGE 180 (KITS) IMPLANT
CHANNEL WORK EXTEND EDGE 45 (KITS) IMPLANT
CHANNEL WORK EXTEND EDGE 90 (KITS) IMPLANT
CLOTH BEACON ORANGE TIMEOUT ST (SAFETY) ×2 IMPLANT
CONT SPEC 4OZ CLIKSEAL STRL BL (MISCELLANEOUS) ×5 IMPLANT
COVER TABLE BACK 60X90 (DRAPES) ×2 IMPLANT
FILTER STRAW FLUID ASPIR (MISCELLANEOUS) IMPLANT
FORCEPS BIOP RJ4 1.8 (CUTTING FORCEPS) IMPLANT
GLOVE BIO SURGEON STRL SZ7 (GLOVE) ×4 IMPLANT
GLOVE SURG SIGNA 7.5 PF LTX (GLOVE) ×2 IMPLANT
KIT LOCATABLE GUIDE (CANNULA) IMPLANT
KIT MARKER FIDUCIAL DELIVERY (KITS) IMPLANT
KIT PROCEDURE EDGE 180 (KITS) IMPLANT
KIT PROCEDURE EDGE 45 (KITS) IMPLANT
KIT PROCEDURE EDGE 90 (KITS) ×1 IMPLANT
KIT ROOM TURNOVER OR (KITS) ×2 IMPLANT
MARKER SKIN DUAL TIP RULER LAB (MISCELLANEOUS) ×2 IMPLANT
NDL BIOPSY TRANSBRONCH 21G (NEEDLE) IMPLANT
NDL SUPERTRX PREMARK BIOPSY (NEEDLE) IMPLANT
NEEDLE BIOPSY TRANSBRONCH 21G (NEEDLE) IMPLANT
NEEDLE SUPERTRX PREMARK BIOPSY (NEEDLE) ×2 IMPLANT
NEEDLE SYS SONOTIP II EBUSTBNA (NEEDLE) ×2 IMPLANT
NS IRRIG 1000ML POUR BTL (IV SOLUTION) ×2 IMPLANT
OIL SILICONE PENTAX (PARTS (SERVICE/REPAIRS)) ×2 IMPLANT
PAD ARMBOARD 7.5X6 YLW CONV (MISCELLANEOUS) ×4 IMPLANT
PATCHES PATIENT (LABEL) ×2 IMPLANT
SPONGE GAUZE 4X4 12PLY (GAUZE/BANDAGES/DRESSINGS) ×2 IMPLANT
SYR 20CC LL (SYRINGE) ×2 IMPLANT
SYR 20ML ECCENTRIC (SYRINGE) ×4 IMPLANT
SYR 5ML LUER SLIP (SYRINGE) ×2 IMPLANT
TOWEL OR 17X24 6PK STRL BLUE (TOWEL DISPOSABLE) ×2 IMPLANT
TRAP SPECIMEN MUCOUS 40CC (MISCELLANEOUS) ×2 IMPLANT
TUBE CONNECTING 12X1/4 (SUCTIONS) ×2 IMPLANT

## 2011-10-23 NOTE — Anesthesia Postprocedure Evaluation (Signed)
  Anesthesia Post-op Note  Patient: Douglas Carr  Procedure(s) Performed: Procedure(s) (LRB): VIDEO BRONCHOSCOPY WITH ENDOBRONCHIAL ULTRASOUND (Bilateral) VIDEO BRONCHOSCOPY WITH ENDOBRONCHIAL NAVIGATION (Bilateral)  Patient Location: PACU  Anesthesia Type: General  Level of Consciousness: awake and alert   Airway and Oxygen Therapy: Patient Spontanous Breathing  Post-op Pain: none  Post-op Assessment: Post-op Vital signs reviewed, Patient's Cardiovascular Status Stable, Respiratory Function Stable, Patent Airway, No signs of Nausea or vomiting and Pain level controlled  Post-op Vital Signs: stable  Complications: No apparent anesthesia complications

## 2011-10-23 NOTE — Interval H&P Note (Signed)
PCCM Interval Hx:   No clinical changes since consultation. He has been asymptomatic, CT scan abnormalities found by chance. Filed Vitals:   10/23/11 0630  BP: 131/77  Pulse: 70  Temp: 97.8 F (36.6 C)  Resp: 18   Plan:  Will proceed with FOB, nodal bx's, TBBx's and brushings with attention to possible fungal dz, possible early HSP (although CT scan appearance atypical).   Levy Pupa, MD, PhD 10/23/2011, 7:39 AM [facility] [phone] or if no answer [phone]

## 2011-10-23 NOTE — Anesthesia Procedure Notes (Signed)
Procedure Name: Intubation Date/Time: 10/23/2011 7:57 AM Performed by: Margaree Mackintosh Pre-anesthesia Checklist: Patient identified, Timeout performed, Emergency Drugs available, Suction available and Patient being monitored Patient Re-evaluated:Patient Re-evaluated prior to inductionOxygen Delivery Method: Circle system utilized Preoxygenation: Pre-oxygenation with 100% oxygen Intubation Type: IV induction Ventilation: Mask ventilation without difficulty Laryngoscope Size: Mac and 4 Tube type: Oral Tube size: 8.5 mm Number of attempts: 1 Airway Equipment and Method: Stylet and LTA kit utilized Placement Confirmation: ETT inserted through vocal cords under direct vision,  CO2 detector and breath sounds checked- equal and bilateral Secured at: 22 cm Tube secured with: Tape Dental Injury: Teeth and Oropharynx as per pre-operative assessment

## 2011-10-23 NOTE — H&P (View-Only) (Signed)
Subjective:    Patient ID: Douglas Carr, male    DOB: Jun 01, 1965, 46 y.o.   MRN: [id number]  HPI 46 yo smoker with hx HTN, CAD, DM1. Referred by Dr  Sedonia Small when a pre-op CXR showed pulm nodules. A CT scan was done that shows stable old calcified nodules RUL, but also B scattered ground glass opacities. No symptoms. 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   Past Medical History  Diagnosis Date  . Hypertension   . Coronary artery disease   . Diabetes mellitus     ON ORAL MEDICATION AND INSULIN  . Hyperlipidemia   . Myocardial infarction 1996  . Anginal pain   . Peripheral vascular disease     HAS LEFT CAROTID ARTERY STENOSIS   AND IS S/P RIGHT CAROTID ENDARTERECTOMY 2010  . Anxiety   . Arthritis     BIL KNEE PAIN AND BIL ANKLE PAIN     No family history on file.   History   Social History  . Marital Status: Married    Spouse Name: N/A    Number of Children: N/A  . Years of Education: N/A   Occupational History  . Not on file.   Social History Main Topics  . Smoking status: Current Everyday Smoker -- 2.0 packs/day for 22 years    Types: Cigarettes  . Smokeless tobacco: Never Used  . Alcohol Use: 0.0 oz/week    2-3 Cans of beer per week  . Drug Use: No  . Sexually Active:    Other Topics Concern  . Not on file   Social History Narrative  . No narrative on file     No Known Allergies   Outpatient Prescriptions Prior to Visit  Medication Sig Dispense Refill  . ALPRAZolam (XANAX PO) Take by mouth. PT TAKES 1/2 PILL UP TO 4 TIMES A DAY IF NEED FOR ANXIETY--BUT ALWAYS TAKES 1/2 PILL AT BEDTIME 0.5 MG PER [facility]      . aspirin 325 MG tablet Take 325 mg by mouth daily.      Marland Kitchen escitalopram (LEXAPRO) 20 MG tablet Take 20 mg by mouth daily. IN AM FOR ANXIETY      . HYDROcodone-acetaminophen (NORCO) 5-325 MG per tablet Take 1 tablet by mouth every 6 (six) hours as needed.  pain      . ibuprofen (ADVIL,MOTRIN) 200 MG tablet Take 400 mg by mouth every 6 (six) hours as needed. pain      . insulin glargine (LANTUS) 100 UNIT/ML injection Inject 20-25 Units into the skin 2 (two) times daily. Takes 20 units in the morning and 25 units at night      . lisinopril (PRINIVIL,ZESTRIL) 20 MG tablet Take 20 mg by mouth daily. IN AM      . meloxicam (MOBIC) 15 MG tablet Take 15 mg by mouth daily. IN AM      . metFORMIN (GLUCOPHAGE) 1000 MG tablet Take 1,000 mg by mouth 2 (two) times daily with a meal.      . metoprolol tartrate (LOPRESSOR) 25 MG tablet Take 25 mg by mouth 2 (two) times daily.      . ranolazine (RANEXA) 1000 MG SR tablet Take 1,000 mg by mouth 2 (two) times daily.      . rosuvastatin (CRESTOR) 40 MG tablet Take 40 mg by mouth daily. AT BEDTIME      . clopidogrel (PLAVIX) 75 MG  tablet Take 75 mg by mouth daily. PRESCRIPTION RAN OUT ABOUT 2 MONTHS AGO  --? April 2013 AND PT DID NOT REFILL PRESCRIPTION          Review of Systems  Constitutional: Negative for fever and unexpected weight change.  HENT: Negative for ear pain, nosebleeds, congestion, sore throat, rhinorrhea, sneezing, trouble swallowing, dental problem, postnasal drip and sinus pressure.   Eyes: Negative for redness and itching.  Respiratory: Positive for chest tightness and shortness of breath. Negative for cough and wheezing.   Cardiovascular: Negative for palpitations and leg swelling.  Gastrointestinal: Negative for nausea and vomiting.  Genitourinary: Negative for dysuria.  Musculoskeletal: Negative for joint swelling.  Skin: Negative for rash.  Neurological: Negative for headaches.  Hematological: Does not bruise/bleed easily.  Psychiatric/Behavioral: Positive for dysphoric mood. The patient is nervous/anxious.        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 The ground glass areas are less clear to me. Could be sarcoid, atypical infxn, unusual presentation of malignancy. I believe he needs PPD, EBUS + FOB (possibly w ENB) - will place PPD - will arrange for EBUS/ENB asap

## 2011-10-23 NOTE — Op Note (Signed)
Video Bronchoscopy with Endobronchial Ultrasound and Electromagnetic Navigation Procedure Note  Date of Operation: 10/23/2011  Pre-op Diagnosis: lymphadenopathy and bilateral ground glass infiltrates  Post-op Diagnosis: same, suspect granulomatous inflammation  Surgeon: Levy Pupa  Assistants: none  Anesthesia: General endotracheal anesthesia  Operation: Flexible video fiberoptic bronchoscopy with endobronchial ultrasound and biopsies.  Estimated Blood Loss: Minimal  Complications: none apparent  Indications and History: Douglas Carr is a 46 y.o. male under evaluation for an abnormal CT scan of the chest. He underwent screening chest x-ray prior to an orthopedic procedure that showed possible pulmonary nodules. He then underwent CT scan of the chest that confirmed mediastinal lymphadenopathy and scattered nodular groundglass infiltrates. The decision was made to pursue bronchoscopy to achieve a diagnosis.  The risks, benefits, complications, treatment options and expected outcomes were discussed with the patient.  The possibilities of pneumothorax, pneumonia, reaction to medication, pulmonary aspiration, perforation of a viscus, bleeding, failure to diagnose a condition and creating a complication requiring transfusion or operation were discussed with the patient who freely signed the consent.    Description of Procedure: The patient was examined in the preoperative area and history and data from the preprocedure consultation were reviewed. It was deemed appropriate to proceed.  The patient was taken to OR10, identified as Douglas Carr and the procedure verified as Flexible Video Fiberoptic Bronchoscopy.  A Time Out was held and the above information confirmed. After being taken to the operating room general anesthesia was initiated and the patient  was orally intubated. The video fiberoptic bronchoscope was introduced via the endotracheal tube and a general inspection was  performed which showed a normal airway exam. The standard scope was then withdrawn and the endobronchial ultrasound was used to identify and characterize the peritracheal, hilar and bronchial lymph nodes. Inspection showed a large subcarinal node as well as a pathologically enlarged 10R node. Using real-time ultrasound guidance Wang needle biopsies were take from Station 7 and 10R nodes and were sent for cytology. One of the station 7 needle biopsies was put on a clear slide to be sent for fungal smear.    Prior to the date of the procedure a high-resolution CT scan of the chest was performed. Utilizing superDimension software a virtual tracheobronchial tree was generated to allow the creation of distinct navigation pathways to the patient's parenchymal abnormalities. The standard video fiberoptic bronchoscope was re-introduced via the endotracheal tube. The extendable working channel and locator guide were introduced into the bronchoscope. The distinct navigation pathways prepared prior to this procedure were then utilized to navigate to within 1-1.5cm  of patient's RUL lesions and LLL lesion identified on CT scan. The extendable working channel was secured into place and the locator guide was withdrawn. Under fluoroscopic guidance transbronchial brushings and transbronchial forceps biopsies were performed to be sent for cytology and pathology. Finally, a bronchioalveolar lavage was performed in the RUL and sent for cytology and microbiology (bacterial, fungal, AFB smears and cultures).  The patient tolerated the procedure well without apparent complications. There was no significant blood loss. The bronchoscope was withdrawn. A post-procedural CXR is pending. Anesthesia was reversed and the patient was taken to the PACU for recovery.   Samples: 1. Wang needle biopsies from 7 node for cytology 2. Wang needle biopsies from 7 node for fungal smear 3. Wang needle biopsies from 10R node for cytology 4.  Transbronchial brushings from RUL anterior segment 5. Transbronchial brushings from RUL posterior segment 6. Transbronchial brushings from LLL superior segment 7. Transbronchial  forceps biopsies from RUL anterior segment 8. Transbronchial forceps biopsies from RUL posterior segment 9. Transbronchial forceps biopsies from LLL superior segment 10. BAL from the RUL  Plans:  The patient will be discharged from the PACU to home following CXR review and when recovered from anesthesia. We will review the cytology, pathology and microbiology results with the patient when they become available. Outpatient followup will be with Dr Delton Coombes.   Levy Pupa, MD, PhD 10/23/2011, 10:11 AM [facility] [phone] or if no answer [phone]

## 2011-10-23 NOTE — Transfer of Care (Signed)
Immediate Anesthesia Transfer of Care Note  Patient: Douglas Carr  Procedure(s) Performed: Procedure(s) (LRB): VIDEO BRONCHOSCOPY WITH ENDOBRONCHIAL ULTRASOUND (Bilateral) VIDEO BRONCHOSCOPY WITH ENDOBRONCHIAL NAVIGATION (Bilateral)  Patient Location: PACU  Anesthesia Type: General  Level of Consciousness: awake, alert  and oriented  Airway & Oxygen Therapy: Patient Spontanous Breathing and Patient connected to nasal cannula oxygen  Post-op Assessment: Report given to PACU RN and Post -op Vital signs reviewed and stable  Post vital signs: Reviewed and stable  Complications: No apparent anesthesia complications

## 2011-10-23 NOTE — Preoperative (Signed)
Beta Blockers   Reason not to administer Beta Blockers:Not Applicable, took Metorprolol this am

## 2011-10-23 NOTE — Discharge Instructions (Signed)
Bronchoscopy Care After These instructions give you information on caring for yourself after your procedure. Your doctor may also give you specific instructions. Call your doctor if you have any problems or questions after your procedure. HOME CARE  Do not eat or drink anything for 2 hours after your test. Your nose and throat was numbed by medicine. If you try to eat or drink before the medicine wears off, food or drink could go into your lungs.   For the rest of the first day, eat soft food and drink liquids slowly.   On the day after the test, you can go back to eating your usual food.   Do not drive or sign important papers the day of the test.   Take it easy for the next 2 days. Do not do any heavy work, exercise, or activities.   Only take medicine as told by your doctor. Do not take aspirin.   You may be drowsy for the next 24 hours.   You may see traces of blood in your spit for 1 to 2 days.  Finding out the results of your test Ask when your test results will be ready. Make sure you get your test results. GET HELP RIGHT AWAY IF:  You have breathing problems.   You have a bad sore throat for more than 1 week.   You see traces of blood in your spit for more than 3 days.   You start coughing up blood.   You have a temperature of 102 F (38.9 C) or higher.  MAKE SURE YOU:  Understand these instructions.   Will watch your condition.   Will get help right away if you are not doing well or get worse.  Document Released: 02/16/2009 Document Revised: 04/10/2011 Document Reviewed: 02/16/2009 Exit[employer] Patient Information 2012 [employer]. 

## 2011-10-23 NOTE — Anesthesia Preprocedure Evaluation (Signed)
Anesthesia Evaluation  Patient identified by MRN, date of birth, ID band Patient awake    Reviewed: Allergy & Precautions, H&P , NPO status , Patient's Chart, lab work & pertinent test results  Airway Mallampati: I TM Distance: >3 FB Neck ROM: Full    Dental   Pulmonary    Pulmonary exam normal       Cardiovascular hypertension, + CAD and + Past MI     Neuro/Psych Anxiety    GI/Hepatic   Endo/Other  Diabetes mellitus-  Renal/GU      Musculoskeletal   Abdominal (+) + obese,   Peds  Hematology   Anesthesia Other Findings   Reproductive/Obstetrics                           Anesthesia Physical Anesthesia Plan  ASA: III  Anesthesia Plan: General   Post-op Pain Management:    Induction: Intravenous  Airway Management Planned: Oral ETT  Additional Equipment:   Intra-op Plan:   Post-operative Plan: Extubation in OR  Informed Consent: I have reviewed the patients History and Physical, chart, labs and discussed the procedure including the risks, benefits and alternatives for the proposed anesthesia with the patient or authorized representative who has indicated his/her understanding and acceptance.     Plan Discussed with: CRNA and Surgeon  Anesthesia Plan Comments:         Anesthesia Quick Evaluation

## 2011-10-24 LAB — AFB STAIN: Acid Fast Smear: NONE SEEN

## 2011-10-24 LAB — FUNGAL STAIN
Fungal Smear: NONE SEEN
Fungal Smear: NONE SEEN

## 2011-10-26 LAB — CULTURE, RESPIRATORY W GRAM STAIN: Culture: NO GROWTH

## 2011-10-28 ENCOUNTER — Telehealth: Payer: Self-pay | Admitting: Emergency Medicine

## 2011-10-28 NOTE — Telephone Encounter (Signed)
Called, spoke with pt's spouse, Erskine Squibb.  Pt had bx done last Thursday.  Per Erskine Squibb, RB advised results would be available yesterday or today.  Would like to know if they are available yet.  Pls advise.  Thank you.

## 2011-10-29 NOTE — Telephone Encounter (Signed)
Called Dr Aplington's office, spoke with Toniann Fail and informed her that RB okayed pt for ankle surgery.  Phone note to be faxed to [place] at [phone], no cover sheet needed.    Called spoke with pt's wife Erskine Squibb, and informed her that Dr Aplington's office has been contacted with the surgery clearance.  Also, 1 month follow up with RB scheduled for 7.22.13 @ 0900.  Pt's wife okay with this date and time.   Phone note faxed to the number above.  Will sign and route to RB as FYI.

## 2011-10-29 NOTE — Telephone Encounter (Signed)
RB, please advise on bx results, thanks

## 2011-10-29 NOTE — Telephone Encounter (Signed)
Pt's wife called back wanting results Douglas Carr

## 2011-10-29 NOTE — Telephone Encounter (Signed)
Reviewed the results with jane by phone. There is granulomatous inflamation on Wang needle bx's in LAD, the distal bx's are all negative. The cx's are all pending, smears were negative. Etiology of his scattered GGI not yet cclear to me - I will wait for the final cx data to decide whether I should treat him with empiric steroids or possibly repeat CT or get open lung bx.   In meantime, he is cleared to get his ankle surgery with Dr Simonne Come at [facility]. Please call Dr Aplington's office and send a copy of this note so that he can get his SGY done (I routed to his office also).   Also, please make pt an OV with me in 1 month. Thanks.

## 2011-10-30 ENCOUNTER — Encounter (HOSPITAL_COMMUNITY): Payer: Self-pay | Admitting: *Deleted

## 2011-10-30 NOTE — Discharge Instructions (Signed)
20 Douglas Carr  10/30/2011   Your procedure is scheduled on:  11/05/11 AT 10:30AM  Report to SHORT STAY DEPT  at 8:00 AM.  Call this number if you have problems the morning of surgery: [phone]   Remember:   Do not eat food or drink liquids AFTER MIDNIGHT    Take these medicines the morning of surgery with A SIP OF WATER: XANAX/LEXAPRO/NORCO/METOPROLOL/RAMEXA/ THAKE 1/2 DOSE OF LANTUS INSULIN THE NIGHT BEFORE SURG   Do not wear jewelry, make-up or nail polish.  Do not wear lotions, powders, or perfumes.   Do not shave legs or underarms 48 hrs. before surgery (men may shave face)  Do not bring valuables to the hospital.  Contacts, dentures or bridgework may not be worn into surgery.  Leave suitcase in the car. After surgery it may be brought to your room.  For patients admitted to the hospital, checkout time is 11:00 AM the day of discharge.   Patients discharged the day of surgery will not be allowed to drive home.    Special Instructions:   Please read over the following fact sheets that you were given: MRSA  Information               SHOWER WITH BETASEPT THE NIGHT BEFORE SURGERY AND THE MORNING OF SURGERY

## 2011-10-31 ENCOUNTER — Encounter (HOSPITAL_COMMUNITY): Payer: Self-pay | Admitting: *Deleted

## 2011-11-03 ENCOUNTER — Other Ambulatory Visit: Payer: Self-pay | Admitting: Orthopedic Surgery

## 2011-11-03 ENCOUNTER — Encounter (HOSPITAL_COMMUNITY): Payer: Self-pay | Admitting: Pharmacy Technician

## 2011-11-05 ENCOUNTER — Encounter (HOSPITAL_COMMUNITY): Payer: Self-pay | Admitting: Anesthesiology

## 2011-11-05 ENCOUNTER — Encounter (HOSPITAL_COMMUNITY): Payer: Self-pay | Admitting: *Deleted

## 2011-11-05 ENCOUNTER — Encounter (HOSPITAL_COMMUNITY)
Admission: RE | Disposition: A | Discharge status: Home / Self Care | Payer: Self-pay | Source: Ambulatory Visit | Attending: Orthopedic Surgery

## 2011-11-05 ENCOUNTER — Ambulatory Visit (HOSPITAL_COMMUNITY): Payer: BC Managed Care – PPO

## 2011-11-05 ENCOUNTER — Ambulatory Visit (HOSPITAL_COMMUNITY): Payer: BC Managed Care – PPO | Admitting: Anesthesiology

## 2011-11-05 ENCOUNTER — Ambulatory Visit (HOSPITAL_COMMUNITY)
Admission: RE | Admit: 2011-11-05 | Discharge: 2011-11-05 | Disposition: A | Discharge status: Home / Self Care | Payer: BC Managed Care – PPO | Source: Ambulatory Visit | Attending: Orthopedic Surgery | Admitting: Orthopedic Surgery

## 2011-11-05 DIAGNOSIS — Z87891 Personal history of nicotine dependence: Secondary | ICD-10-CM | POA: Insufficient documentation

## 2011-11-05 DIAGNOSIS — E119 Type 2 diabetes mellitus without complications: Secondary | ICD-10-CM | POA: Insufficient documentation

## 2011-11-05 DIAGNOSIS — I2510 Atherosclerotic heart disease of native coronary artery without angina pectoris: Secondary | ICD-10-CM | POA: Insufficient documentation

## 2011-11-05 DIAGNOSIS — Z794 Long term (current) use of insulin: Secondary | ICD-10-CM | POA: Insufficient documentation

## 2011-11-05 DIAGNOSIS — Z7982 Long term (current) use of aspirin: Secondary | ICD-10-CM | POA: Insufficient documentation

## 2011-11-05 DIAGNOSIS — M898X9 Other specified disorders of bone, unspecified site: Secondary | ICD-10-CM | POA: Insufficient documentation

## 2011-11-05 DIAGNOSIS — Z79899 Other long term (current) drug therapy: Secondary | ICD-10-CM | POA: Insufficient documentation

## 2011-11-05 DIAGNOSIS — I10 Essential (primary) hypertension: Secondary | ICD-10-CM | POA: Insufficient documentation

## 2011-11-05 DIAGNOSIS — I252 Old myocardial infarction: Secondary | ICD-10-CM | POA: Insufficient documentation

## 2011-11-05 DIAGNOSIS — E785 Hyperlipidemia, unspecified: Secondary | ICD-10-CM | POA: Insufficient documentation

## 2011-11-05 DIAGNOSIS — I739 Peripheral vascular disease, unspecified: Secondary | ICD-10-CM | POA: Insufficient documentation

## 2011-11-05 LAB — GLUCOSE, CAPILLARY: Glucose-Capillary: 199 mg/dL — ABNORMAL HIGH (ref 70–99)

## 2011-11-05 LAB — SURGICAL PCR SCREEN: Staphylococcus aureus: NEGATIVE

## 2011-11-05 SURGERY — ANKLE FUSION
Anesthesia: General | Site: Ankle | Laterality: Right

## 2011-11-05 MED ORDER — ONDANSETRON HCL 4 MG PO TABS
4.0000 mg | ORAL_TABLET | Freq: Four times a day (QID) | ORAL | Status: DC | PRN
  Filled 2011-11-05: qty 1

## 2011-11-05 MED ORDER — CEFAZOLIN SODIUM-DEXTROSE 2-3 GM-% IV SOLR
INTRAVENOUS | Status: AC
  Filled 2011-11-05: qty 50

## 2011-11-05 MED ORDER — HYDROMORPHONE HCL PF 1 MG/ML IJ SOLN: 0.5000 mg | INTRAMUSCULAR | Status: DC | PRN

## 2011-11-05 MED ORDER — MEPERIDINE HCL 50 MG/ML IJ SOLN: 6.2500 mg | INTRAMUSCULAR | Status: DC | PRN

## 2011-11-05 MED ORDER — ROCURONIUM BROMIDE 100 MG/10ML IV SOLN
INTRAVENOUS | Status: DC | PRN
  Administered 2011-11-05: 20 mg via INTRAVENOUS
  Administered 2011-11-05: 10 mg via INTRAVENOUS

## 2011-11-05 MED ORDER — FENTANYL CITRATE 0.05 MG/ML IJ SOLN
INTRAMUSCULAR | Status: AC
  Filled 2011-11-05: qty 2

## 2011-11-05 MED ORDER — SODIUM CHLORIDE 0.9 % IV SOLN: INTRAVENOUS | Status: DC

## 2011-11-05 MED ORDER — ONDANSETRON HCL 4 MG/2ML IJ SOLN
INTRAMUSCULAR | Status: DC | PRN
  Administered 2011-11-05: 4 mg via INTRAVENOUS

## 2011-11-05 MED ORDER — BUPIVACAINE HCL (PF) 0.5 % IJ SOLN
INTRAMUSCULAR | Status: DC | PRN
  Administered 2011-11-05: 5 mL

## 2011-11-05 MED ORDER — NEOSTIGMINE METHYLSULFATE 1 MG/ML IJ SOLN
INTRAMUSCULAR | Status: DC | PRN
  Administered 2011-11-05: 4 mg via INTRAVENOUS

## 2011-11-05 MED ORDER — FENTANYL CITRATE 0.05 MG/ML IJ SOLN
INTRAMUSCULAR | Status: DC | PRN
Start: 2011-11-05 — End: 2011-11-05
  Administered 2011-11-05: 25 ug via INTRAVENOUS
  Administered 2011-11-05: 50 ug via INTRAVENOUS
  Administered 2011-11-05: 25 ug via INTRAVENOUS

## 2011-11-05 MED ORDER — ONDANSETRON HCL 4 MG/2ML IJ SOLN: 4.0000 mg | Freq: Four times a day (QID) | INTRAMUSCULAR | Status: DC | PRN

## 2011-11-05 MED ORDER — OXYCODONE-ACETAMINOPHEN 5-325 MG PO TABS
ORAL_TABLET | ORAL | Status: AC
  Filled 2011-11-05: qty 1

## 2011-11-05 MED ORDER — METHOCARBAMOL 100 MG/ML IJ SOLN
500.0000 mg | Freq: Four times a day (QID) | INTRAVENOUS | Status: DC | PRN
  Administered 2011-11-05: 500 mg via INTRAVENOUS
  Filled 2011-11-05: qty 5

## 2011-11-05 MED ORDER — METHOCARBAMOL 500 MG PO TABS: 500.0000 mg | ORAL_TABLET | Freq: Four times a day (QID) | ORAL | Status: AC | PRN

## 2011-11-05 MED ORDER — MUPIROCIN 2 % EX OINT
TOPICAL_OINTMENT | CUTANEOUS | Status: AC
  Filled 2011-11-05: qty 22

## 2011-11-05 MED ORDER — POVIDONE-IODINE 7.5 % EX SOLN: Freq: Once | CUTANEOUS | Status: DC

## 2011-11-05 MED ORDER — PROPOFOL 10 MG/ML IV EMUL
INTRAVENOUS | Status: DC | PRN
  Administered 2011-11-05: 200 mg via INTRAVENOUS

## 2011-11-05 MED ORDER — OXYCODONE-ACETAMINOPHEN 5-325 MG PO TABS
1.0000 | ORAL_TABLET | ORAL | Status: DC | PRN
  Administered 2011-11-05: 1 via ORAL

## 2011-11-05 MED ORDER — SUCCINYLCHOLINE CHLORIDE 20 MG/ML IJ SOLN
INTRAMUSCULAR | Status: DC | PRN
  Administered 2011-11-05: 100 mg via INTRAVENOUS

## 2011-11-05 MED ORDER — OXYCODONE-ACETAMINOPHEN 5-325 MG PO TABS: 1.0000 | ORAL_TABLET | ORAL | Status: AC | PRN

## 2011-11-05 MED ORDER — FENTANYL CITRATE 0.05 MG/ML IJ SOLN
25.0000 ug | INTRAMUSCULAR | Status: DC | PRN
  Administered 2011-11-05: 25 ug via INTRAVENOUS
  Administered 2011-11-05: 50 ug via INTRAVENOUS

## 2011-11-05 MED ORDER — METOCLOPRAMIDE HCL 5 MG PO TABS
5.0000 mg | ORAL_TABLET | Freq: Three times a day (TID) | ORAL | Status: DC | PRN
  Filled 2011-11-05: qty 2

## 2011-11-05 MED ORDER — CEFAZOLIN SODIUM-DEXTROSE 2-3 GM-% IV SOLR
2.0000 g | INTRAVENOUS | Status: AC
  Administered 2011-11-05: 2 g via INTRAVENOUS

## 2011-11-05 MED ORDER — ACETAMINOPHEN 10 MG/ML IV SOLN
INTRAVENOUS | Status: DC | PRN
  Administered 2011-11-05: 1000 mg via INTRAVENOUS

## 2011-11-05 MED ORDER — LACTATED RINGERS IV SOLN
INTRAVENOUS | Status: DC
  Administered 2011-11-05: 1000 mL via INTRAVENOUS
  Administered 2011-11-05: 11:00:00 via INTRAVENOUS

## 2011-11-05 MED ORDER — BUPIVACAINE HCL (PF) 0.5 % IJ SOLN
INTRAMUSCULAR | Status: AC
  Filled 2011-11-05: qty 30

## 2011-11-05 MED ORDER — LACTATED RINGERS IV SOLN: INTRAVENOUS | Status: DC

## 2011-11-05 MED ORDER — METOCLOPRAMIDE HCL 5 MG/ML IJ SOLN: 5.0000 mg | Freq: Three times a day (TID) | INTRAMUSCULAR | Status: DC | PRN

## 2011-11-05 MED ORDER — METHOCARBAMOL 500 MG PO TABS: 500.0000 mg | ORAL_TABLET | Freq: Four times a day (QID) | ORAL | Status: DC | PRN

## 2011-11-05 MED ORDER — LIDOCAINE HCL (CARDIAC) 20 MG/ML IV SOLN
INTRAVENOUS | Status: DC | PRN
  Administered 2011-11-05: 80 mg via INTRAVENOUS

## 2011-11-05 MED ORDER — HYDROCODONE-ACETAMINOPHEN 5-325 MG PO TABS: 1.0000 | ORAL_TABLET | Freq: Four times a day (QID) | ORAL | Status: DC | PRN

## 2011-11-05 MED ORDER — GLYCOPYRROLATE 0.2 MG/ML IJ SOLN
INTRAMUSCULAR | Status: DC | PRN
  Administered 2011-11-05: .6 mg via INTRAVENOUS

## 2011-11-05 MED ORDER — MIDAZOLAM HCL 5 MG/5ML IJ SOLN
INTRAMUSCULAR | Status: DC | PRN
  Administered 2011-11-05: 2 mg via INTRAVENOUS

## 2011-11-05 MED ORDER — ACETAMINOPHEN 10 MG/ML IV SOLN
INTRAVENOUS | Status: AC
  Filled 2011-11-05: qty 100

## 2011-11-05 MED ORDER — PROMETHAZINE HCL 25 MG/ML IJ SOLN: 6.2500 mg | INTRAMUSCULAR | Status: DC | PRN

## 2011-11-05 MED ORDER — EPHEDRINE SULFATE 50 MG/ML IJ SOLN
INTRAMUSCULAR | Status: DC | PRN
  Administered 2011-11-05: 5 mg via INTRAVENOUS

## 2011-11-05 MED ORDER — 0.9 % SODIUM CHLORIDE (POUR BTL) OPTIME
TOPICAL | Status: DC | PRN
  Administered 2011-11-05: 1000 mL

## 2011-11-05 SURGICAL SUPPLY — 31 items
BAG SPEC THK2 15X12 ZIP CLS (MISCELLANEOUS) ×1
BAG ZIPLOCK 12X15 (MISCELLANEOUS) ×2 IMPLANT
BANDAGE ELASTIC 6 VELCRO ST LF (GAUZE/BANDAGES/DRESSINGS) ×1 IMPLANT
CLOTH BEACON ORANGE TIMEOUT ST (SAFETY) ×2 IMPLANT
COVER SURGICAL LIGHT HANDLE (MISCELLANEOUS) ×2 IMPLANT
CUFF TOURN SGL QUICK 34 (TOURNIQUET CUFF) ×2
CUFF TRNQT CYL 34X4X40X1 (TOURNIQUET CUFF) ×1 IMPLANT
DECANTER SPIKE VIAL GLASS SM (MISCELLANEOUS) ×1 IMPLANT
DRAPE C-ARM 42X72 X-RAY (DRAPES) ×2 IMPLANT
DRAPE U-SHAPE 47X51 STRL (DRAPES) ×2 IMPLANT
DRSG ADAPTIC 3X8 NADH LF (GAUZE/BANDAGES/DRESSINGS) ×2 IMPLANT
DRSG PAD ABDOMINAL 8X10 ST (GAUZE/BANDAGES/DRESSINGS) ×2 IMPLANT
ELECT REM PT RETURN 9FT ADLT (ELECTROSURGICAL) ×2
ELECTRODE REM PT RTRN 9FT ADLT (ELECTROSURGICAL) ×1 IMPLANT
FACESHIELD LNG OPTICON STERILE (SAFETY) ×1 IMPLANT
GLOVE BIO SURGEON STRL SZ7.5 (GLOVE) ×2 IMPLANT
GOWN STRL REIN XL XLG (GOWN DISPOSABLE) ×2 IMPLANT
NEEDLE HYPO 22GX1.5 SAFETY (NEEDLE) ×2 IMPLANT
PACK LOWER EXTREMITY WL (CUSTOM PROCEDURE TRAY) ×2 IMPLANT
PAD CAST 4YDX4 CTTN HI CHSV (CAST SUPPLIES) ×2 IMPLANT
PADDING CAST COTTON 4X4 STRL (CAST SUPPLIES) ×8
POSITIONER SURGICAL ARM (MISCELLANEOUS) ×2 IMPLANT
SPLINT PLASTER CAST XFAST 5X30 (CAST SUPPLIES) IMPLANT
SPLINT PLASTER XFAST SET 5X30 (CAST SUPPLIES) ×2
SPONGE GAUZE 4X4 12PLY (GAUZE/BANDAGES/DRESSINGS) ×2 IMPLANT
SUT ETHILON 3 0 PS 1 (SUTURE) ×1 IMPLANT
SUT ETHILON 4 0 PS 2 18 (SUTURE) ×2 IMPLANT
SUT VIC AB 2-0 CT1 27 (SUTURE) ×4
SUT VIC AB 2-0 CT1 TAPERPNT 27 (SUTURE) ×1 IMPLANT
SYR CONTROL 10ML LL (SYRINGE) ×2 IMPLANT
TOWEL OR 17X26 10 PK STRL BLUE (TOWEL DISPOSABLE) ×4 IMPLANT

## 2011-11-05 NOTE — Anesthesia Preprocedure Evaluation (Addendum)
Anesthesia Evaluation  Patient identified by MRN, date of birth, ID band Patient awake    Reviewed: Allergy & Precautions, H&P , NPO status , Patient's Chart, lab work & pertinent test results  Airway Mallampati: I TM Distance: >3 FB Neck ROM: Full    Dental   Pulmonary Current Smoker,    Pulmonary exam normal       Cardiovascular hypertension, Pt. on medications + angina with exertion + CAD and + Past MI     Neuro/Psych Anxiety    GI/Hepatic   Endo/Other  Diabetes mellitus-, Type 2, Insulin Dependent and Oral Hypoglycemic Agents  Renal/GU      Musculoskeletal   Abdominal (+) + obese,   Peds  Hematology   Anesthesia Other Findings   Reproductive/Obstetrics                           Anesthesia Physical  Anesthesia Plan  ASA: III  Anesthesia Plan: General   Post-op Pain Management:    Induction: Intravenous  Airway Management Planned: Oral ETT  Additional Equipment:   Intra-op Plan:   Post-operative Plan: Extubation in OR  Informed Consent: I have reviewed the patients History and Physical, chart, labs and discussed the procedure including the risks, benefits and alternatives for the proposed anesthesia with the patient or authorized representative who has indicated his/her understanding and acceptance.   Dental advisory given  Plan Discussed with: CRNA and Surgeon  Anesthesia Plan Comments:         Anesthesia Quick Evaluation

## 2011-11-05 NOTE — Preoperative (Signed)
Beta Blockers   Reason not to administer Beta Blockers:Metoprolol 11-05-11 at 0700

## 2011-11-05 NOTE — H&P (Signed)
Douglas Carr is an 46 y.o. male.   Chief Complaintpainful rt ankle QMV:HQION demonstrate kissing osteophytes of talus and tibia  Past Medical History  Diagnosis Date  . Hypertension   . Coronary artery disease   . Diabetes mellitus     ON ORAL MEDICATION AND INSULIN  . Hyperlipidemia   . Myocardial infarction 1996  . Anginal pain   . Peripheral vascular disease     HAS LEFT CAROTID ARTERY STENOSIS   AND IS S/P RIGHT CAROTID ENDARTERECTOMY 2010  . Anxiety   . Arthritis     BIL KNEE PAIN AND BIL ANKLE PAIN    Past Surgical History  Procedure Date  . Vascular surgery 09/11/2008    RIGHT CAROTID ENDARTERECTOMY  . Cardiac catheterization     FEB 2010  . Appendectomy   . Carotid endarterectomy 2010  . Lung biopsy 2013    History reviewed. No pertinent family history. Social History:  reports that he quit smoking about 1 weeks ago. His smoking use included Cigarettes. He has a 44 pack-year smoking history. He has never used smokeless tobacco. He reports that he drinks alcohol. He reports that he does not use illicit drugs.  Allergies: No Known Allergies  Medications Prior to Admission  Medication Sig Dispense Refill  . ALPRAZolam (XANAX) 0.5 MG tablet Take 0.25-0.5 mg by mouth 4 (four) times daily as needed. For anxiety.  Take  tablet up to 3 times daily as needed, and take 1 tablet every night at bedtime.      Marland Kitchen escitalopram (LEXAPRO) 20 MG tablet Take 20 mg by mouth every morning.      Marland Kitchen HYDROcodone-acetaminophen (NORCO) 5-325 MG per tablet Take 1 tablet by mouth every 6 (six) hours as needed. For pain.      Marland Kitchen insulin glargine (LANTUS) 100 UNIT/ML injection Inject 20-25 Units into the skin 2 (two) times daily. Inject 20 units every morning and inject 25 units every night at bedtime.Took 12 units 11/04/11      . lisinopril (PRINIVIL,ZESTRIL) 20 MG tablet Take 20 mg by mouth every morning.       . metoprolol tartrate (LOPRESSOR) 25 MG tablet Take 25 mg by mouth 2 (two) times  daily.      . ranolazine (RANEXA) 1000 MG SR tablet Take 1,000 mg by mouth 2 (two) times daily.      Marland Kitchen aspirin 325 MG tablet Take 325 mg by mouth daily.      Marland Kitchen ibuprofen (ADVIL,MOTRIN) 200 MG tablet Take 400 mg by mouth every 6 (six) hours as needed. For pain.      . meloxicam (MOBIC) 15 MG tablet Take 15 mg by mouth every morning.       . metFORMIN (GLUCOPHAGE) 1000 MG tablet Take 1,000 mg by mouth 2 (two) times daily with a meal.      . naproxen (NAPROSYN) 500 MG tablet Take 500 mg by mouth daily.       . rosuvastatin (CRESTOR) 40 MG tablet Take 40 mg by mouth at bedtime.         Results for orders placed during the hospital encounter of 11/05/11 (from the past 48 hour(s))  GLUCOSE, CAPILLARY     Status: Abnormal   Collection Time   11/05/11  8:27 AM      Component Value Range Comment   Glucose-Capillary 199 (*) 70 - 99 mg/dL    No results found.  ROS  Blood pressure 121/75, pulse 84, temperature 97.5 F (36.4 C), temperature  source Oral, resp. rate 18, height 5\' 9"  (1.753 m), weight 91.23 kg (201 lb 2 oz), SpO2 100.00%. Physical Exam  Constitutional: He is oriented to person, place, and time. He appears well-developed and well-nourished.  HENT:  Head: Normocephalic and atraumatic.  Right Ear: External ear normal.  Left Ear: External ear normal.  Eyes: Conjunctivae and EOM are normal. Pupils are equal, round, and reactive to light.  Neck: Normal range of motion. Neck supple.  Cardiovascular: Normal rate, regular rhythm, normal heart sounds and intact distal pulses.   Respiratory: Effort normal and breath sounds normal.  GI: Soft. Bowel sounds are normal.  Musculoskeletal: He exhibits tenderness.       Tender anterior rt ankle with decreased rom  Neurological: He is alert and oriented to person, place, and time. He has normal reflexes.  Skin: Skin is warm and dry.  Psychiatric: He has a normal mood and affect. His behavior is normal. Judgment and thought content normal.      Assessment/Plan Painful rt ankle due to kissing osteophytes of tibia and talus Excision osteophytes of rt tibia and talus  Kizzy Olafson P 11/05/2011, 9:53 AM

## 2011-11-05 NOTE — Anesthesia Postprocedure Evaluation (Signed)
  Anesthesia Post-op Note  Patient: Douglas Carr  Procedure(s) Performed: Procedure(s) (LRB): ANKLE FUSION (Right)  Patient Location: PACU  Anesthesia Type: General  Level of Consciousness: awake and alert   Airway and Oxygen Therapy: Patient Spontanous Breathing  Post-op Pain: mild  Post-op Assessment: Post-op Vital signs reviewed, Patient's Cardiovascular Status Stable, Respiratory Function Stable, Patent Airway and No signs of Nausea or vomiting  Post-op Vital Signs: stable  Complications: No apparent anesthesia complications

## 2011-11-05 NOTE — Transfer of Care (Signed)
Immediate Anesthesia Transfer of Care Note  Patient: Douglas Carr  Procedure(s) Performed: Procedure(s) (LRB): ANKLE FUSION (Right)  Patient Location: PACU  Anesthesia Type: General  Level of Consciousness: awake, alert , oriented and patient cooperative  Airway & Oxygen Therapy: Patient Spontanous Breathing and Patient connected to face mask oxygen  Post-op Assessment: Report given to PACU RN, Post -op Vital signs reviewed and stable and Patient moving all extremities  Post vital signs: Reviewed and stable  Complications: No apparent anesthesia complications

## 2011-11-05 NOTE — Brief Op Note (Signed)
11/05/2011  11:32 AM  PATIENT:  [name]  46 y.o. male  PRE-OPERATIVE DIAGNOSIS:  right ankle painful osteophyte off tibia and talus  POST-OPERATIVE DIAGNOSIS:  right ankle painful osteophyte off tibia and talus  PROCEDURE:  Procedure(s) (LRB): Arthrotomy right ankle with excision osteophytes off tibia and talus  SURGEON:  Surgeon(s) and Role:    * Drucilla Schmidt, MD - Primary  PHYSICIAN ASSISTANT:  Mr Idolina Primer [facility]  ASSISTANTSnurse   ANESTHESIA:   general  EBL:  Total I/O In: 1200 [I.V.:1200] Out: -   BLOOD ADMINISTERED:none  DRAINS: none   LOCAL MEDICATIONS USED:  MARCAINE     SPECIMEN:  No Specimen  DISPOSITION OF SPECIMEN:  N/A  COUNTS:  YES  TOURNIQUET:   Total Tourniquet Time Documented: Thigh (Right) - 161096 minutes  DICTATION: .Other Dictation: Dictation Number [id number]  PLAN OF CARE: Admit for overnight observation  PATIENT DISPOSITION:  PACU - hemodynamically stable.   Delay start of Pharmacological VTE agent (>24hrs) due to surgical blood loss or risk of bleeding: yes

## 2011-11-06 NOTE — Op Note (Signed)
NAMEDEMETRIC, DUNNAWAY NO.:  [id number]  MEDICAL RECORD NO.:  [id number]  LOCATION:  [facility]                         FACILITY:  [facility]  PHYSICIAN:  Marlowe Kays, M.D.  DATE OF BIRTH:  01-04-1966  DATE OF PROCEDURE:  11/05/2011 DATE OF DISCHARGE:  11/05/2011                              OPERATIVE REPORT   PREOPERATIVE DIAGNOSIS:  Painful right ankle secondary to abutting osteophytes of the anterior tibia and talus.  POSTOPERATIVE DIAGNOSIS:  Painful right ankle secondary to abutting osteophytes of the anterior tibia and talus.  OPERATION:  Right ankle arthrotomy with excision of large osteophytes of the dorsum of the talus and trimming of osteophytes off the anterior tibial surface.  SURGEON:  Marlowe Kays, M.D..  ASSIST:  Druscilla Brownie. Cherlynn June.  ANESTHESIA:  General.  PATHOLOGY AND JUSTIFICATION FOR PROCEDURE:  Stated in diagnosis. Lateral x-ray in the office demonstrated abutment of these 2 areas.  Mr. Angie Fava assistance was essential for retraction, helping to manage holding the leg, and assistance with instrumentation.  PROCEDURE:  Prophylactic antibiotics.  Satisfactory general anesthesia, pneumatic tourniquet, right leg was Esmarch out, nonsterilely and tourniquet inflated to 300 mmHg.  The right leg was then prepped with DuraPrep from midcalf to toes and draped in sterile field.  Time-out performed.  Vertical midline incision carefully dissecting out the anterior tibial tendon medially and neurovascular bundle laterally working all the way down to the anterior joint where the osteophyte was easily palpable.  I opened the joint with a knife blade and then exposed both the osteophyte and the anterior tibia with Mr. Idolina Primer retracting to the side to expose the anterior tibial surface.  With osteotome rongeur and rasp, I removed a large osteophyte off the patella.  Then, with the combination of instruments including quarter-inch and  half-inch curved osteotome, small rongeur and curette, I removed the anterior abutting portions of the tibia.  I tried to preserve as much of the tibial articular surface as possible.  I took two lateral x-rays with C- arm to confirm that all offending bone had been removed.  The wound was then irrigated with sterile saline.  Soft tissue was infiltrated with 0.5% plain Marcaine.  I closed the synovium with interrupted 2-0 Vicryl as well as the thin fascia over the tendinous structures.  Skin and subcutaneous tissue were then closed with interrupted 4 nylon mattress sutures.  Betadine, Adaptic, dry sterile dressing which is well-padded and then short-leg splint cast was applied.  Tourniquet was released.  He tolerated the procedure well.  At the time of this dictation, he is on his way to the recovery room in satisfactory condition with no known complications.          ______________________________ Marlowe Kays, M.D.     JA/MEDQ  D:  11/05/2011  T:  11/05/2011  Job:  [id number]

## 2011-11-20 LAB — FUNGUS CULTURE W SMEAR: Fungal Smear: NONE SEEN

## 2011-11-24 ENCOUNTER — Ambulatory Visit: Payer: BC Managed Care – PPO | Admitting: Emergency Medicine

## 2011-11-26 ENCOUNTER — Other Ambulatory Visit: Payer: Self-pay | Admitting: Orthopedic Surgery

## 2011-11-28 ENCOUNTER — Encounter (HOSPITAL_COMMUNITY): Payer: Self-pay | Admitting: Pharmacy Technician

## 2011-12-05 LAB — AFB CULTURE WITH SMEAR (NOT AT ARMC)

## 2011-12-10 ENCOUNTER — Encounter: Payer: Self-pay | Admitting: Vascular Surgery

## 2011-12-15 ENCOUNTER — Encounter (HOSPITAL_COMMUNITY): Payer: Self-pay

## 2011-12-15 ENCOUNTER — Encounter (HOSPITAL_COMMUNITY)
Admission: RE | Admit: 2011-12-15 | Discharge: 2011-12-15 | Disposition: A | Discharge status: Home / Self Care | Payer: BC Managed Care – PPO | Source: Ambulatory Visit | Attending: Orthopedic Surgery | Admitting: Orthopedic Surgery

## 2011-12-15 HISTORY — DX: Other enthesopathy of unspecified foot and ankle: M77.50

## 2011-12-15 LAB — BASIC METABOLIC PANEL
GFR calc Af Amer: >90 mL/min (ref >90)
GFR calc non Af Amer: >90 mL/min (ref >90)
Glucose, Bld: 382 mg/dL — ABNORMAL HIGH (ref 70–99)
Potassium: 4.8 mEq/L (ref 3.5–5.1)
Sodium: 133 mEq/L — ABNORMAL LOW (ref 135–145)

## 2011-12-15 LAB — CBC
Hemoglobin: 15.4 g/dL (ref 13.0–17.0)
MCHC: 34.5 g/dL (ref 30.0–36.0)
WBC: 6.4 10*3/uL (ref 4.0–10.5)

## 2011-12-15 LAB — SURGICAL PCR SCREEN
MRSA, PCR: NEGATIVE
Staphylococcus aureus: NEGATIVE

## 2011-12-15 NOTE — Patient Instructions (Addendum)
[address]  12/15/2011   Your procedure is scheduled on:  12/19/11 AT 12:30 PM  Report to SHORT STAY DEPT  at 10:00 AM.  Call this number if you have problems the morning of surgery: [phone]   Remember:   Do not eat food  AFTER MIDNIGHT  May have clear liquids UNTIL 6 HOURS BEFORE SURGERY(6:30 AM)  Clear liquids include soda, tea, black coffee, apple or grape juice, broth.  Take these medicines the morning of surgery with A SIP OF WATER: LEXAPRO / METOPROLOL / RENEXA / ALPRAZOLAM IF NEEDED / TAKE 1/2 DOSE OF LANTUS INSULIN THE NIGHT BEFORE SURGERY   Do not wear jewelry, make-up or nail polish.  Do not wear lotions, powders, or perfumes.   Do not shave legs or underarms 48 hrs. before surgery (men may shave face)  Do not bring valuables to the hospital.  Contacts, dentures or bridgework may not be worn into surgery.  Leave suitcase in the car. After surgery it may be brought to your room.  For patients admitted to the hospital, checkout time is 11:00 AM the day of discharge.   Patients discharged the day of surgery will not be allowed to drive home.    Special Instructions:   Please read over the following fact sheets that you were given: MRSA  Information               SHOWER WITH BETASEPT THE NIGHT BEFORE SURGERY AND THE MORNING OF SURGERY

## 2011-12-19 ENCOUNTER — Encounter (HOSPITAL_COMMUNITY): Payer: Self-pay | Admitting: *Deleted

## 2011-12-19 ENCOUNTER — Encounter (HOSPITAL_COMMUNITY): Payer: Self-pay | Admitting: Anesthesiology

## 2011-12-19 ENCOUNTER — Ambulatory Visit (HOSPITAL_COMMUNITY)
Admission: RE | Admit: 2011-12-19 | Discharge: 2011-12-19 | Disposition: A | Discharge status: Home / Self Care | Payer: BC Managed Care – PPO | Source: Ambulatory Visit | Attending: Orthopedic Surgery | Admitting: Orthopedic Surgery

## 2011-12-19 ENCOUNTER — Encounter (HOSPITAL_COMMUNITY)
Admission: RE | Disposition: A | Discharge status: Home / Self Care | Payer: Self-pay | Source: Ambulatory Visit | Attending: Orthopedic Surgery

## 2011-12-19 ENCOUNTER — Ambulatory Visit (HOSPITAL_COMMUNITY): Payer: BC Managed Care – PPO | Admitting: Anesthesiology

## 2011-12-19 ENCOUNTER — Ambulatory Visit (HOSPITAL_COMMUNITY): Payer: BC Managed Care – PPO

## 2011-12-19 DIAGNOSIS — M898X9 Other specified disorders of bone, unspecified site: Secondary | ICD-10-CM | POA: Insufficient documentation

## 2011-12-19 DIAGNOSIS — I739 Peripheral vascular disease, unspecified: Secondary | ICD-10-CM | POA: Insufficient documentation

## 2011-12-19 DIAGNOSIS — E119 Type 2 diabetes mellitus without complications: Secondary | ICD-10-CM | POA: Insufficient documentation

## 2011-12-19 DIAGNOSIS — M259 Joint disorder, unspecified: Secondary | ICD-10-CM

## 2011-12-19 DIAGNOSIS — E785 Hyperlipidemia, unspecified: Secondary | ICD-10-CM | POA: Insufficient documentation

## 2011-12-19 DIAGNOSIS — I10 Essential (primary) hypertension: Secondary | ICD-10-CM | POA: Insufficient documentation

## 2011-12-19 DIAGNOSIS — Z01812 Encounter for preprocedural laboratory examination: Secondary | ICD-10-CM | POA: Insufficient documentation

## 2011-12-19 DIAGNOSIS — Z79899 Other long term (current) drug therapy: Secondary | ICD-10-CM | POA: Insufficient documentation

## 2011-12-19 DIAGNOSIS — M25579 Pain in unspecified ankle and joints of unspecified foot: Secondary | ICD-10-CM | POA: Insufficient documentation

## 2011-12-19 DIAGNOSIS — I252 Old myocardial infarction: Secondary | ICD-10-CM | POA: Insufficient documentation

## 2011-12-19 DIAGNOSIS — I2510 Atherosclerotic heart disease of native coronary artery without angina pectoris: Secondary | ICD-10-CM | POA: Insufficient documentation

## 2011-12-19 LAB — GLUCOSE, CAPILLARY
Glucose-Capillary: 299 mg/dL — ABNORMAL HIGH (ref 70–99)
Glucose-Capillary: 170 mg/dL — ABNORMAL HIGH (ref 70–99)
Glucose-Capillary: 121 mg/dL — ABNORMAL HIGH (ref 70–99)

## 2011-12-19 SURGERY — ANKLE FUSION
Anesthesia: General | Site: Ankle | Laterality: Left

## 2011-12-19 MED ORDER — BUPIVACAINE HCL (PF) 0.5 % IJ SOLN
INTRAMUSCULAR | Status: DC | PRN
  Administered 2011-12-19: 10 mL

## 2011-12-19 MED ORDER — METHOCARBAMOL 500 MG PO TABS: 500.0000 mg | ORAL_TABLET | Freq: Four times a day (QID) | ORAL | Status: AC

## 2011-12-19 MED ORDER — INSULIN ASPART 100 UNIT/ML ~~LOC~~ SOLN
SUBCUTANEOUS | Status: AC
  Filled 2011-12-19: qty 1

## 2011-12-19 MED ORDER — PROPOFOL 10 MG/ML IV BOLUS
INTRAVENOUS | Status: DC | PRN
  Administered 2011-12-19: 180 mg via INTRAVENOUS

## 2011-12-19 MED ORDER — HYDROMORPHONE HCL PF 1 MG/ML IJ SOLN
INTRAMUSCULAR | Status: AC
  Filled 2011-12-19: qty 1

## 2011-12-19 MED ORDER — POVIDONE-IODINE 7.5 % EX SOLN: Freq: Once | CUTANEOUS | Status: DC

## 2011-12-19 MED ORDER — CEFAZOLIN SODIUM-DEXTROSE 2-3 GM-% IV SOLR
2.0000 g | INTRAVENOUS | Status: AC
  Administered 2011-12-19: 2 g via INTRAVENOUS

## 2011-12-19 MED ORDER — PROMETHAZINE HCL 25 MG/ML IJ SOLN
6.2500 mg | INTRAMUSCULAR | Status: DC | PRN
Start: 2011-12-19 — End: 2011-12-19

## 2011-12-19 MED ORDER — LIDOCAINE HCL (CARDIAC) 20 MG/ML IV SOLN
INTRAVENOUS | Status: DC | PRN
  Administered 2011-12-19: 50 mg via INTRAVENOUS

## 2011-12-19 MED ORDER — SODIUM CHLORIDE 0.9 % IR SOLN
Status: DC | PRN
  Administered 2011-12-19: 14:00:00

## 2011-12-19 MED ORDER — BUPIVACAINE HCL (PF) 0.5 % IJ SOLN
INTRAMUSCULAR | Status: AC
  Filled 2011-12-19: qty 30

## 2011-12-19 MED ORDER — ACETAMINOPHEN 10 MG/ML IV SOLN
INTRAVENOUS | Status: DC | PRN
  Administered 2011-12-19: 1000 mg via INTRAVENOUS

## 2011-12-19 MED ORDER — ONDANSETRON HCL 4 MG/2ML IJ SOLN
INTRAMUSCULAR | Status: DC | PRN
  Administered 2011-12-19: 4 mg via INTRAVENOUS

## 2011-12-19 MED ORDER — MIDAZOLAM HCL 5 MG/5ML IJ SOLN
INTRAMUSCULAR | Status: DC | PRN
  Administered 2011-12-19: 2 mg via INTRAVENOUS

## 2011-12-19 MED ORDER — FENTANYL CITRATE 0.05 MG/ML IJ SOLN
INTRAMUSCULAR | Status: DC | PRN
  Administered 2011-12-19 (×4): 25 ug via INTRAVENOUS

## 2011-12-19 MED ORDER — HYDROMORPHONE HCL PF 1 MG/ML IJ SOLN
0.2500 mg | INTRAMUSCULAR | Status: DC | PRN
  Administered 2011-12-19 (×2): 0.5 mg via INTRAVENOUS

## 2011-12-19 MED ORDER — LACTATED RINGERS IV SOLN
INTRAVENOUS | Status: DC
  Administered 2011-12-19: 1000 mL via INTRAVENOUS
  Administered 2011-12-19: 15:00:00 via INTRAVENOUS

## 2011-12-19 MED ORDER — INSULIN ASPART 100 UNIT/ML ~~LOC~~ SOLN
5.0000 [IU] | Freq: Once | SUBCUTANEOUS | Status: AC
  Administered 2011-12-19: 5 [IU] via SUBCUTANEOUS

## 2011-12-19 MED ORDER — ACETAMINOPHEN 10 MG/ML IV SOLN
INTRAVENOUS | Status: AC
  Filled 2011-12-19: qty 100

## 2011-12-19 MED ORDER — INSULIN ASPART 100 UNIT/ML ~~LOC~~ SOLN
10.0000 [IU] | Freq: Once | SUBCUTANEOUS | Status: AC
  Administered 2011-12-19: 10 [IU] via SUBCUTANEOUS
  Filled 2011-12-19: qty 1

## 2011-12-19 MED ORDER — HYDROMORPHONE HCL 4 MG PO TABS: 4.0000 mg | ORAL_TABLET | ORAL | Status: AC | PRN

## 2011-12-19 MED ORDER — KETOROLAC TROMETHAMINE 30 MG/ML IJ SOLN: 15.0000 mg | Freq: Once | INTRAMUSCULAR | Status: DC | PRN

## 2011-12-19 MED ORDER — LACTATED RINGERS IV SOLN
INTRAVENOUS | Status: DC | PRN
  Administered 2011-12-19: 13:00:00 via INTRAVENOUS

## 2011-12-19 MED ORDER — INSULIN REGULAR HUMAN 100 UNIT/ML IJ SOLN
5.0000 [IU] | Freq: Once | INTRAMUSCULAR | Status: DC
  Filled 2011-12-19: qty 0.05

## 2011-12-19 MED ORDER — KETOROLAC TROMETHAMINE 30 MG/ML IJ SOLN
INTRAMUSCULAR | Status: DC | PRN
  Administered 2011-12-19: 30 mg via INTRAVENOUS

## 2011-12-19 MED ORDER — METOPROLOL TARTRATE 25 MG PO TABS
25.0000 mg | ORAL_TABLET | ORAL | Status: AC
  Administered 2011-12-19: 25 mg via ORAL
  Filled 2011-12-19: qty 1

## 2011-12-19 MED ORDER — CEFAZOLIN SODIUM-DEXTROSE 2-3 GM-% IV SOLR
INTRAVENOUS | Status: AC
  Filled 2011-12-19: qty 50

## 2011-12-19 MED ORDER — LACTATED RINGERS IV SOLN: INTRAVENOUS | Status: DC

## 2011-12-19 SURGICAL SUPPLY — 31 items
BAG SPEC THK2 15X12 ZIP CLS (MISCELLANEOUS) ×1
BAG ZIPLOCK 12X15 (MISCELLANEOUS) ×2 IMPLANT
BANDAGE ELASTIC 6 VELCRO ST LF (GAUZE/BANDAGES/DRESSINGS) ×1 IMPLANT
CLOTH BEACON ORANGE TIMEOUT ST (SAFETY) ×2 IMPLANT
COVER SURGICAL LIGHT HANDLE (MISCELLANEOUS) ×2 IMPLANT
CUFF TOURN SGL QUICK 34 (TOURNIQUET CUFF) ×2
CUFF TRNQT CYL 34X4X40X1 (TOURNIQUET CUFF) ×1 IMPLANT
DECANTER SPIKE VIAL GLASS SM (MISCELLANEOUS) ×2 IMPLANT
DRAPE C-ARM 42X72 X-RAY (DRAPES) ×2 IMPLANT
DRAPE U-SHAPE 47X51 STRL (DRAPES) ×2 IMPLANT
DRSG ADAPTIC 3X8 NADH LF (GAUZE/BANDAGES/DRESSINGS) ×2 IMPLANT
DRSG PAD ABDOMINAL 8X10 ST (GAUZE/BANDAGES/DRESSINGS) ×2 IMPLANT
ELECT REM PT RETURN 9FT ADLT (ELECTROSURGICAL) ×2
ELECTRODE REM PT RTRN 9FT ADLT (ELECTROSURGICAL) ×1 IMPLANT
GLOVE BIO SURGEON STRL SZ7.5 (GLOVE) ×3 IMPLANT
GOWN STRL REIN XL XLG (GOWN DISPOSABLE) ×3 IMPLANT
NEEDLE HYPO 22GX1.5 SAFETY (NEEDLE) ×2 IMPLANT
PACK LOWER EXTREMITY WL (CUSTOM PROCEDURE TRAY) ×2 IMPLANT
PAD CAST 4YDX4 CTTN HI CHSV (CAST SUPPLIES) ×2 IMPLANT
PADDING CAST COTTON 4X4 STRL (CAST SUPPLIES) ×4
PADDING CAST COTTON 6X4 STRL (CAST SUPPLIES) ×1 IMPLANT
POSITIONER SURGICAL ARM (MISCELLANEOUS) ×2 IMPLANT
SPLINT PLASTER CAST XFAST 5X30 (CAST SUPPLIES) IMPLANT
SPLINT PLASTER XFAST SET 5X30 (CAST SUPPLIES) ×1
SPONGE GAUZE 4X4 12PLY (GAUZE/BANDAGES/DRESSINGS) ×2 IMPLANT
SUT BONE WAX W31G (SUTURE) ×1 IMPLANT
SUT ETHILON 4 0 PS 2 18 (SUTURE) ×4 IMPLANT
SUT VIC AB 2-0 CT1 27 (SUTURE) ×2
SUT VIC AB 2-0 CT1 TAPERPNT 27 (SUTURE) ×1 IMPLANT
SYR CONTROL 10ML LL (SYRINGE) ×2 IMPLANT
TOWEL OR 17X26 10 PK STRL BLUE (TOWEL DISPOSABLE) ×4 IMPLANT

## 2011-12-19 NOTE — Progress Notes (Signed)
Novolog insulin given for CBG 299mg /dl per order.  Wrong Order entered prior to administration and accepted prior to administration.  Correct order entered and accepted.  Pharmacy notified, comment made on regular insulin order.

## 2011-12-19 NOTE — Anesthesia Postprocedure Evaluation (Signed)
  Anesthesia Post-op Note  Patient: Douglas Carr  Procedure(s) Performed: Procedure(s) (LRB): ANKLE FUSION (Left)  Patient Location: PACU  Anesthesia Type: General  Level of Consciousness: awake and alert   Airway and Oxygen Therapy: Patient Spontanous Breathing  Post-op Pain: mild  Post-op Assessment: Post-op Vital signs reviewed, Patient's Cardiovascular Status Stable, Respiratory Function Stable, Patent Airway and No signs of Nausea or vomiting  Post-op Vital Signs: stable  Complications: No apparent anesthesia complications

## 2011-12-19 NOTE — Brief Op Note (Signed)
12/19/2011  2:44 PM  PATIENT:  [name]  46 y.o. male  PRE-OPERATIVE DIAGNOSIS:  Pain osteophyte of the left ankle  POST-OPERATIVE DIAGNOSIS:  Pain osteophyte of the left ankle  PROCEDURE:  Procedure(s) (LRB): Excision of osteophytes distal lt tibia and anterior talus  SURGEON:  Surgeon(s) and Role:    * Drucilla Schmidt, MD - Primary  PHYSICIAN ASSISTANT:   ASSISTANTS:nurse  ANESTHESIA:   local and general  EBL:     BLOOD ADMINISTERED:none  DRAINS: none   LOCAL MEDICATIONS USED:  MARCAINE     SPECIMEN:  No Specimen  DISPOSITION OF SPECIMEN:  N/A  COUNTS:  YES  TOURNIQUET:  * Missing tourniquet times found for documented tourniquets in log:  16109 *  DICTATION: .Other Dictation: Dictation Number [id number]  PLAN OF CARE: Discharge to home after PACU  PATIENT DISPOSITION:  PACU - hemodynamically stable.   Delay start of Pharmacological VTE agent (>24hrs) due to surgical blood loss or risk of bleeding: yes

## 2011-12-19 NOTE — Transfer of Care (Signed)
Immediate Anesthesia Transfer of Care Note  Patient: Douglas Carr  Procedure(s) Performed: Procedure(s) (LRB): ANKLE FUSION (Left)  Patient Location: PACU  Anesthesia Type: General  Level of Consciousness: sedated, patient cooperative and responds to stimulaton  Airway & Oxygen Therapy: Patient Spontanous Breathing and Patient connected to face mask oxgen  Post-op Assessment: Report given to PACU RN and Post -op Vital signs reviewed and stable  Post vital signs: Reviewed and stable  Complications: No apparent anesthesia complications

## 2011-12-19 NOTE — H&P (Signed)
Douglas Carr is an 46 y.o. male.   Chief Complaint: painful lt ankle HPI:S/P excision osteophytes rt talus and tibia with significant pain relief.  Has similar problem in lt ankle  Past Medical History  Diagnosis Date  . Hypertension   . Coronary artery disease   . Diabetes mellitus     ON ORAL MEDICATION AND INSULIN  . Hyperlipidemia   . Myocardial infarction 1996  . Anginal pain   . Peripheral vascular disease     HAS LEFT CAROTID ARTERY STENOSIS   AND IS S/P RIGHT CAROTID ENDARTERECTOMY 2010  . Anxiety   . Arthritis     BIL KNEE PAIN AND BIL ANKLE PAIN  . Bone spur of ankle     Past Surgical History  Procedure Date  . Vascular surgery 09/11/2008    RIGHT CAROTID ENDARTERECTOMY  . Cardiac catheterization     FEB 2010  . Appendectomy   . Carotid endarterectomy 2010  . Lung biopsy 2013  . Rt ankle  2013    History reviewed. No pertinent family history. Social History:  reports that he quit smoking about 8 weeks ago. His smoking use included Cigarettes. He has a 44 pack-year smoking history. He has never used smokeless tobacco. He reports that he drinks alcohol. He reports that he does not use illicit drugs.  Allergies: No Known Allergies  Medications Prior to Admission  Medication Sig Dispense Refill  . ALPRAZolam (XANAX) 0.5 MG tablet Take 0.25-0.5 mg by mouth 4 (four) times daily as needed. For anxiety.  Take  tablet up to 3 times daily as needed, and take 1 tablet every night at bedtime.      Marland Kitchen HYDROcodone-acetaminophen (NORCO) 5-325 MG per tablet Take 1 tablet by mouth every 6 (six) hours as needed. For pain.      Marland Kitchen ibuprofen (ADVIL,MOTRIN) 200 MG tablet Take 400 mg by mouth every 6 (six) hours as needed. For pain.      Marland Kitchen insulin glargine (LANTUS) 100 UNIT/ML injection Inject 20-25 Units into the skin 2 (two) times daily. Inject 20 units every morning and inject 25 units every night at bedtime.Took 12 units 11/04/11      . lisinopril (PRINIVIL,ZESTRIL) 20 MG  tablet Take 20 mg by mouth every morning.       . meloxicam (MOBIC) 15 MG tablet Take 15 mg by mouth every morning.       . metFORMIN (GLUCOPHAGE) 1000 MG tablet Take 1,000 mg by mouth 2 (two) times daily with a meal.      . metoprolol tartrate (LOPRESSOR) 25 MG tablet Take 25 mg by mouth 2 (two) times daily.      . naproxen (NAPROSYN) 500 MG tablet Take 500 mg by mouth daily.       . ranolazine (RANEXA) 1000 MG SR tablet Take 1,000 mg by mouth 2 (two) times daily.      Marland Kitchen aspirin 325 MG tablet Take 325 mg by mouth daily.      Marland Kitchen escitalopram (LEXAPRO) 20 MG tablet Take 20 mg by mouth every morning.      . rosuvastatin (CRESTOR) 40 MG tablet Take 40 mg by mouth at bedtime.         Results for orders placed during the hospital encounter of 12/19/11 (from the past 48 hour(s))  GLUCOSE, CAPILLARY     Status: Abnormal   Collection Time   12/19/11 10:30 AM      Component Value Range Comment   Glucose-Capillary 311 (*) 70 -  99 mg/dL    Comment 1 Documented in Chart      No results found.  ROS  Blood pressure 142/79, pulse 90, temperature 97.6 F (36.4 C), temperature source Oral, resp. rate 18, SpO2 100.00%. Physical Exam  Constitutional: He is oriented to person, place, and time. He appears well-developed and well-nourished.  HENT:  Head: Normocephalic and atraumatic.  Right Ear: External ear normal.  Left Ear: External ear normal.  Nose: Nose normal.  Mouth/Throat: Oropharynx is clear and moist.  Eyes: Conjunctivae and EOM are normal. Pupils are equal, round, and reactive to light.  Neck: Normal range of motion. Neck supple.  Cardiovascular: Normal rate, regular rhythm, normal heart sounds and intact distal pulses.   Respiratory: Effort normal and breath sounds normal.  GI: Soft. Bowel sounds are normal.  Musculoskeletal: Normal range of motion. He exhibits tenderness.       Tender anterior lt ankle  Neurological: He is alert and oriented to person, place, and time. He has normal  reflexes.  Skin: Skin is warm and dry.  Psychiatric: He has a normal mood and affect. His behavior is normal. Judgment and thought content normal.     Assessment/Plan Painful osteophytes lt talus and tibia Lt ankle arthrotomy with excision osteophytes  Abigaelle Verley P 12/19/2011, 12:37 PM

## 2011-12-19 NOTE — Anesthesia Preprocedure Evaluation (Addendum)
Anesthesia Evaluation  Patient identified by MRN, date of birth, ID band Patient awake    Reviewed: Allergy & Precautions, H&P , NPO status , Patient's Chart, lab work & pertinent test results  Airway Mallampati: III TM Distance: <3 FB Neck ROM: Full    Dental No notable dental hx.    Pulmonary Current Smoker,  breath sounds clear to auscultation  Pulmonary exam normal       Cardiovascular hypertension, + angina with exertion + Past MI Rhythm:Regular Rate:Normal     Neuro/Psych negative neurological ROS  negative psych ROS   GI/Hepatic negative GI ROS, Neg liver ROS,   Endo/Other  Insulin Dependent  Renal/GU negative Renal ROS  negative genitourinary   Musculoskeletal negative musculoskeletal ROS (+)   Abdominal   Peds negative pediatric ROS (+)  Hematology negative hematology ROS (+)   Anesthesia Other Findings   Reproductive/Obstetrics negative OB ROS                          Anesthesia Physical Anesthesia Plan  ASA: III  Anesthesia Plan: General   Post-op Pain Management:    Induction: Intravenous  Airway Management Planned: LMA and Oral ETT  Additional Equipment:   Intra-op Plan:   Post-operative Plan: Extubation in OR  Informed Consent: I have reviewed the patients History and Physical, chart, labs and discussed the procedure including the risks, benefits and alternatives for the proposed anesthesia with the patient or authorized representative who has indicated his/her understanding and acceptance.   Dental advisory given  Plan Discussed with: CRNA and Surgeon  Anesthesia Plan Comments:         Anesthesia Quick Evaluation

## 2011-12-20 NOTE — Op Note (Signed)
NAMECUSTER, PIMENTA NO.:  [id number]  MEDICAL RECORD NO.:  [id number]  LOCATION:  [facility]                         FACILITY:  [facility]  PHYSICIAN:  Marlowe Kays, M.D.  DATE OF BIRTH:  27-Nov-1965  DATE OF PROCEDURE:  12/19/2011 DATE OF DISCHARGE:  12/19/2011                              OPERATIVE REPORT   PREOPERATIVE DIAGNOSIS:  Painful left ankle secondary due to impinging osteophytes from distal tibia and talus at ankle joint.  POSTOPERATIVE DIAGNOSIS:  Painful left ankle secondary due to impinging osteophytes from distal tibia and talus at ankle joint.  OPERATION:  Arthrotomy, left ankle with removal of osteophytes from distal tibia and anterior talus.  SURGEON:  Marlowe Kays, M.D.  ASSISTANT:  Nurse.  ANESTHESIA:  General.  PATHOLOGY AND JUSTIFICATION FOR PROCEDURE:  I performed a similar procedure on his right ankle several months ago, and he is very pleased with the results and consequently is here today to have his left ankle done.  PROCEDURE IN DETAIL:  Prophylactic antibiotics, satisfied general anesthesia, pneumatic tourniquet.  Left leg was Esmarch out nonsterilely and tourniquet inflated to 325 mmHg.  Left leg was then prepped with DuraPrep from midcalf to toes and draped in sterile field.  Time-out performed.  Vertical midline incision.  Anteriorly protecting the tendons and neurovascular structures, I carefully worked away down to the ankle joint, which was opened.  He had some small osteophytes off the superior portion as the anterior portion of the talus.  He had more in the way of osteophytic response on the anterior tibia at the ankle joint.  With the rongeur and osteotome, I removed the small osteophytes off the superior portion of the talus, and then with the same I removed the distal portion of the tibia at the ankle joint until there was no impingement.  I took a preoperative x-ray with the C-arm and also final x-ray documented  the removal.  I placed a bone wax over the raw bone and irrigated the ankle with sterile saline.  I then infiltrated soft tissues with 0.5% plain Marcaine.  He was given 30 mg of Toradol IV. The wound was closed in layers with interrupted #1 Vicryl and the capsule and the retinaculum over the extensor tendons, subcu tissue with 2- 0 Vicryl, staples in the skin.  Betadine, Adaptic, dry sterile dressing and well-padded short-leg splint cast were applied.  He tolerated the procedure well, was taken to the recovery room in satisfactory condition with no known complications.          ______________________________ Marlowe Kays, M.D.     JA/MEDQ  D:  12/19/2011  T:  12/20/2011  Job:  [id number]

## 2012-01-06 ENCOUNTER — Ambulatory Visit (INDEPENDENT_AMBULATORY_CARE_PROVIDER_SITE_OTHER): Payer: BC Managed Care – PPO | Admitting: Emergency Medicine

## 2012-01-06 ENCOUNTER — Encounter: Payer: Self-pay | Admitting: Emergency Medicine

## 2012-01-06 ENCOUNTER — Ambulatory Visit (INDEPENDENT_AMBULATORY_CARE_PROVIDER_SITE_OTHER)
Admission: RE | Admit: 2012-01-06 | Discharge: 2012-01-06 | Disposition: A | Discharge status: Home / Self Care | Payer: BC Managed Care – PPO | Source: Ambulatory Visit | Attending: Emergency Medicine | Admitting: Emergency Medicine

## 2012-01-06 VITALS — BP 158/72 | HR 92 | Temp 97.7°F | Ht 69.0 in | Wt 204.8 lb

## 2012-01-06 DIAGNOSIS — R918 Other nonspecific abnormal finding of lung field: Secondary | ICD-10-CM

## 2012-01-06 NOTE — Addendum Note (Signed)
Addended by: Orma Flaming D on: 01/06/2012 10:05 AM   Modules accepted: Orders

## 2012-01-06 NOTE — Progress Notes (Signed)
Quick Note:  Called and spoke with patient informed him of results/recs as listed below per Dr. Delton Coombes. Patient verbalized understanding and nothing further needed at this time. ______

## 2012-01-06 NOTE — Patient Instructions (Addendum)
We will perform a CXR today We will repeat your CT scan of the chest in 12/13.  If you develop symptoms, we may decide to continue the work-up sooner. Please call us if anything changes Follow in December or January, after the CT scan is done

## 2012-01-06 NOTE — Assessment & Plan Note (Signed)
In a micronodular, bronchovascular pattern. Bx's suggest granulomatous dz, micro is negative. He is asymptomatic.  - CXR today - repeat CT scan in 12/13 and decide at that time whether to treat with empiric steroids.  - if any increase in clinical suspicion, may decide to check PET or refer to [facility] for bx.

## 2012-01-06 NOTE — Progress Notes (Signed)
  Subjective:    Patient ID: Douglas Carr, male    DOB: [birth year], 46 y.o.   MRN: [id number]  HPI 46 yo smoker with hx HTN, CAD, DM1. Referred by Dr  Sedonia Small when a pre-op CXR showed pulm nodules. A CT scan was done that shows stable old calcified nodules RUL, but also B scattered ground glass opacities. No symptoms. 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   ROV 01/06/12 -- follows up for LAD and GGI's found on screening CXR and then CT scan. He is s/p EBUS/Bx's 10/23/11 >> no malignancy, granulomatous inflammation, all cx's now negative.      Objective:   Physical Exam Filed Vitals:   01/06/12 0919  BP: 158/72  Pulse: 92  Temp: 97.7 F (36.5 C)   Gen: Pleasant, well-nourished, in no distress,  normal affect  ENT: No lesions,  mouth clear,  oropharynx clear, no postnasal drip  Neck: No JVD, no TMG, no carotid bruits  Lungs: No use of accessory muscles, no dullness to percussion, clear without rales or rhonchi  Cardiovascular: RRR, heart sounds normal, no murmur or gallops, no peripheral edema  Musculoskeletal: No deformities, no cyanosis or clubbing  Neuro: alert, non focal  Skin: Warm, no lesions or rashes     Assessment & Plan:  Pulmonary nodules In a micronodular, bronchovascular pattern. Bx's suggest granulomatous dz, micro is negative. He is asymptomatic.  - CXR today - repeat CT scan in 12/13 and decide at that time whether to treat with empiric steroids.  - if any increase in clinical suspicion, may decide to check PET or refer to [facility] for bx.

## 2012-04-09 ENCOUNTER — Telehealth: Payer: Self-pay | Admitting: Emergency Medicine

## 2012-04-09 DIAGNOSIS — R918 Other nonspecific abnormal finding of lung field: Secondary | ICD-10-CM

## 2012-04-09 NOTE — Telephone Encounter (Signed)
Bmp placed as stat order for ct

## 2012-04-12 ENCOUNTER — Other Ambulatory Visit (INDEPENDENT_AMBULATORY_CARE_PROVIDER_SITE_OTHER): Payer: BC Managed Care – PPO

## 2012-04-12 ENCOUNTER — Ambulatory Visit (INDEPENDENT_AMBULATORY_CARE_PROVIDER_SITE_OTHER)
Admission: RE | Admit: 2012-04-12 | Discharge: 2012-04-12 | Disposition: A | Discharge status: Home / Self Care | Payer: BC Managed Care – PPO | Source: Ambulatory Visit | Attending: Emergency Medicine | Admitting: Emergency Medicine

## 2012-04-12 DIAGNOSIS — R918 Other nonspecific abnormal finding of lung field: Secondary | ICD-10-CM

## 2012-04-12 LAB — BASIC METABOLIC PANEL
BUN: 14 mg/dL (ref 6–23)
Creatinine, Ser: 0.9 mg/dL (ref 0.4–1.5)
GFR: 100.57 mL/min (ref >60.00)
Potassium: 4.4 mEq/L (ref 3.5–5.1)

## 2012-04-12 MED ORDER — IOHEXOL 300 MG/ML  SOLN
80.0000 mL | Freq: Once | INTRAMUSCULAR | Status: AC | PRN
  Administered 2012-04-12: 80 mL via INTRAVENOUS

## 2012-04-15 ENCOUNTER — Telehealth: Payer: Self-pay | Admitting: *Deleted

## 2012-04-15 NOTE — Telephone Encounter (Signed)
Spoke with Tonya and pt glucose was [phone]. I called the pt and advised him of the level and advised that he follow-up with his PCP to address this. Pt states he already has spoken to his PCP. Nothing further needed.Carron Curie, CMA

## 2012-04-26 ENCOUNTER — Encounter: Payer: Self-pay | Admitting: Emergency Medicine

## 2012-04-26 ENCOUNTER — Ambulatory Visit (INDEPENDENT_AMBULATORY_CARE_PROVIDER_SITE_OTHER): Payer: BC Managed Care – PPO | Admitting: Emergency Medicine

## 2012-04-26 VITALS — BP 138/70 | HR 86 | Temp 98.2°F | Ht 69.0 in | Wt 213.0 lb

## 2012-04-26 DIAGNOSIS — R918 Other nonspecific abnormal finding of lung field: Secondary | ICD-10-CM

## 2012-04-26 NOTE — Assessment & Plan Note (Signed)
Full resolution on CT scan 12/17. Suspect some inflammatory, possibly infectious process. Etiology unclear. Granulomatous disease a possibility  - for example sarcoidosis. At this point would recommend serial CXR surveillance. He will get this with his PCP Dr Modesto Charon, see me if something changes.

## 2012-04-26 NOTE — Progress Notes (Signed)
  Subjective:    Patient ID: Douglas Carr, male    DOB: March 20, 1966, 46 y.o.   MRN: [id number]  HPI 46 yo smoker with hx HTN, CAD, DM1. Referred by Dr  Sedonia Small when a pre-op CXR showed pulm nodules. A CT scan was done that shows stable old calcified nodules RUL, but also B scattered ground glass opacities. No symptoms. 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   ROV 01/06/12 -- follows up for LAD and GGI's found on screening CXR and then CT scan. He is s/p EBUS/Bx's 10/23/11 >> no malignancy, granulomatous inflammation, all cx's now negative.   ROV 04/26/12 -- returns to f/u CT scan for surveillance LAD and GGI as above (bx negative).  His scan shows full resolution of his GGI, decrease in size of LAD. His calcified granulomas are unchanged.      Objective:   Physical Exam Filed Vitals:   04/26/12 1637  BP: 138/70  Pulse: 86  Temp: 98.2 F (36.8 C)   Gen: Pleasant, well-nourished, in no distress,  normal affect  ENT: No lesions,  mouth clear,  oropharynx clear, no postnasal drip  Neck: No JVD, no TMG, no carotid bruits  Lungs: No use of accessory muscles, no dullness to percussion, clear without rales or rhonchi  Cardiovascular: RRR, heart sounds normal, no murmur or gallops, no peripheral edema  Musculoskeletal: No deformities, no cyanosis or clubbing  Neuro: alert, non focal  Skin: Warm, no lesions or rashes  Comparison: 10/14/2011   CT scan 04/12/12 --  Findings: Lungs/pleura: There is no pleural effusion identified. Large calcified granulomas are again noted in the right middle lobe.  Multifocal areas of clustered nodularity and ground-glass attenuation identified on the previous examination have resolved. These were likely the sequela of atypical infection. No new pulmonary abnormalities identified.  Heart/Mediastinum: The heart size is normal. No pericardial effusion. Resolution  of the previous mediastinal and hilar adenopathy. Calcified right hilar lymph nodes are identified.  Upper abdomen: Multiple calcified splenic granulomas are identified.  Bones/Musculoskeletal: Mild multilevel thoracic spondylosis. No worrisome bone lesions identified.  IMPRESSION:  1. No active cardiopulmonary abnormalities. Resolution of previously noted mediastinal and hilar adenopathy. There has also been resolution of multiple ground-glass and sub solid densities. 2. Prior granulomatous disease.       Assessment & Plan:  Pulmonary nodules Full resolution on CT scan 12/17. Suspect some inflammatory, possibly infectious process. Etiology unclear. Granulomatous disease a possibility  - for example sarcoidosis. At this point would recommend serial CXR surveillance. He will get this with his PCP Dr Modesto Charon, see me if something changes.

## 2012-04-26 NOTE — Patient Instructions (Addendum)
Your CT scan shows that your prior changes have resolved - these were probably inflammatory. You should probably have a CXR every 1-2 years to insure that nothing changes.

## 2012-07-01 ENCOUNTER — Encounter: Payer: Self-pay | Admitting: Cardiology

## 2012-07-29 ENCOUNTER — Encounter: Payer: Self-pay | Admitting: Cardiovascular Disease

## 2012-12-24 ENCOUNTER — Other Ambulatory Visit (HOSPITAL_COMMUNITY): Payer: Self-pay | Admitting: Cardiovascular Disease

## 2012-12-24 DIAGNOSIS — I739 Peripheral vascular disease, unspecified: Secondary | ICD-10-CM

## 2013-01-13 ENCOUNTER — Ambulatory Visit (HOSPITAL_COMMUNITY)
Admission: RE | Admit: 2013-01-13 | Discharge: 2013-01-13 | Disposition: A | Discharge status: Home / Self Care | Payer: BC Managed Care – PPO | Source: Ambulatory Visit | Attending: Cardiovascular Disease | Admitting: Cardiovascular Disease

## 2013-01-13 DIAGNOSIS — I6529 Occlusion and stenosis of unspecified carotid artery: Secondary | ICD-10-CM

## 2013-01-13 DIAGNOSIS — I739 Peripheral vascular disease, unspecified: Secondary | ICD-10-CM

## 2013-01-13 NOTE — Progress Notes (Signed)
Carotid Duplex Completed. °Brianna L Mazza,RVT °

## 2013-01-24 ENCOUNTER — Telehealth: Payer: Self-pay | Admitting: Family Medicine

## 2013-01-24 ENCOUNTER — Ambulatory Visit (INDEPENDENT_AMBULATORY_CARE_PROVIDER_SITE_OTHER): Payer: BC Managed Care – PPO | Admitting: Family Medicine

## 2013-01-24 ENCOUNTER — Encounter: Payer: Self-pay | Admitting: Family Medicine

## 2013-01-24 VITALS — BP 141/81 | HR 97 | Temp 99.3°F | Ht 70.0 in | Wt 215.4 lb

## 2013-01-24 DIAGNOSIS — K047 Periapical abscess without sinus: Secondary | ICD-10-CM

## 2013-01-24 DIAGNOSIS — K044 Acute apical periodontitis of pulpal origin: Secondary | ICD-10-CM

## 2013-01-24 MED ORDER — AMOXICILLIN 875 MG PO TABS: 875.0000 mg | ORAL_TABLET | Freq: Two times a day (BID) | ORAL | Status: DC

## 2013-01-24 NOTE — Progress Notes (Signed)
Patient ID: Douglas Carr, male   DOB: Dec 20, 1965, 47 y.o.   MRN: [id number] SUBJECTIVE: CC: Chief Complaint  Patient presents with  . 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     HPI: This came up over the last 2 days. painful gum swelling.right sided roof of the mouth. No fever. Used to chew tobacco and smoked but not for many years.  ROS: As above in the HPI. All other systems are stable or negative.  OBJECTIVE: APPEARANCE:  Patient in no acute distress.The patient appeared well nourished and normally developed. Acyanotic. Waist: VITAL SIGNS:BP 141/81  Pulse 97  Temp(Src) 99.3 F (37.4 C) (Oral)  Ht 5\' 10"  (1.778 m)  Wt 215 lb 6.4 oz (97.705 kg)  BMI 30.91 kg/m2 WM obese.  SKIN: warm and  Dry without overt rashes, tattoos and scars  HEAD and Neck: without JVD, Head and scalp: normal Eyes:No scleral icterus. Fundi normal, eye movements normal. Ears: Auricle normal, canal normal, Tympanic membranes normal, insufflation normal. Nose: normal Throat: swelling right side of the roof of the mouth.with a yellow and black spot.? Etiology. Tender to touch. No pus. Adjacent teeth are fine. Neck & thyroid: normal  CHEST & LUNGS: Chest wall: normal Lungs: Clear  CVS: Reveals the PMI to be normally located. Regular rhythm, First and Second Heart sounds are normal,  absence of murmurs, rubs or gallops. Peripheral vasculature: Radial pulses: normal Dorsal pedis pulses: normal Posterior pulses: normal  ABDOMEN:  Appearance: normal Benign, no organomegaly, no masses, no Abdominal Aortic enlargement. No Guarding , no rebound. No Bruits. Bowel sounds: normal  RECTAL: N/A GU: N/A  EXTREMETIES: nonedematous.  MUSCULOSKELETAL:  Spine: normal Joints: intact  NEUROLOGIC: oriented to time,place and person; nonfocal. Strength is normal Sensory is normal Reflexes are normal Cranial Nerves are  normal.  ASSESSMENT: Dental infection - Plan: amoxicillin (AMOXIL) 875 MG tablet  PLAN: Meds ordered this encounter  Medications  . amoxicillin (AMOXIL) 875 MG tablet    Sig: Take 1 tablet (875 mg total) by mouth 2 (two) times daily.    Dispense:  20 tablet    Refill:  0  Discussed with patient that if not resolved this will need further evaluation.  Return in about 11 days (around 02/04/2013).  Nitzia Perren P. Modesto Charon, M.D.

## 2013-01-24 NOTE — Telephone Encounter (Signed)
Appt given for today 

## 2013-01-25 ENCOUNTER — Telehealth: Payer: Self-pay | Admitting: *Deleted

## 2013-01-25 ENCOUNTER — Encounter: Payer: Self-pay | Admitting: *Deleted

## 2013-01-25 DIAGNOSIS — I6529 Occlusion and stenosis of unspecified carotid artery: Secondary | ICD-10-CM

## 2013-01-25 NOTE — Telephone Encounter (Signed)
Message copied by Marella Bile on Tue Jan 25, 2013  9:02 AM ------      Message from: Runell Gess      Created: Sun Jan 23, 2013 11:22 AM       No change from prior study. Repeat in 12 months. ------

## 2013-01-25 NOTE — Telephone Encounter (Signed)
Order placed for repeat carotid doppler in 1 year 

## 2013-02-04 ENCOUNTER — Ambulatory Visit: Payer: BC Managed Care – PPO | Admitting: Family Medicine

## 2013-06-16 ENCOUNTER — Telehealth: Payer: Self-pay | Admitting: Family Medicine

## 2013-06-16 MED ORDER — OSELTAMIVIR PHOSPHATE 75 MG PO CAPS: 75.0000 mg | ORAL_CAPSULE | Freq: Two times a day (BID) | ORAL | Status: DC

## 2013-06-16 NOTE — Telephone Encounter (Signed)
rX sent to pharmacy

## 2013-06-16 NOTE — Telephone Encounter (Signed)
Now he is having signs of the flu you told them to call back if someone had the symptoms please [facility]

## 2013-09-21 ENCOUNTER — Ambulatory Visit (INDEPENDENT_AMBULATORY_CARE_PROVIDER_SITE_OTHER): Payer: BC Managed Care – PPO | Admitting: Family

## 2013-09-21 ENCOUNTER — Encounter: Payer: Self-pay | Admitting: Family

## 2013-09-21 VITALS — BP 150/75 | HR 93 | Temp 98.1°F | Ht 70.0 in | Wt 215.8 lb

## 2013-09-21 DIAGNOSIS — E1159 Type 2 diabetes mellitus with other circulatory complications: Secondary | ICD-10-CM | POA: Insufficient documentation

## 2013-09-21 DIAGNOSIS — E785 Hyperlipidemia, unspecified: Secondary | ICD-10-CM

## 2013-09-21 DIAGNOSIS — E1169 Type 2 diabetes mellitus with other specified complication: Secondary | ICD-10-CM | POA: Insufficient documentation

## 2013-09-21 DIAGNOSIS — E782 Mixed hyperlipidemia: Secondary | ICD-10-CM

## 2013-09-21 DIAGNOSIS — E559 Vitamin D deficiency, unspecified: Secondary | ICD-10-CM

## 2013-09-21 DIAGNOSIS — E119 Type 2 diabetes mellitus without complications: Secondary | ICD-10-CM

## 2013-09-21 DIAGNOSIS — I10 Essential (primary) hypertension: Secondary | ICD-10-CM

## 2013-09-21 LAB — GLUCOSE, POCT (MANUAL RESULT ENTRY)
POC Glucose: 315 mg/dl — AB (ref 70–99)
POC Glucose: 280 mg/dl — AB (ref 70–99)

## 2013-09-21 LAB — POCT GLYCOSYLATED HEMOGLOBIN (HGB A1C): Hemoglobin A1C: >14

## 2013-09-21 MED ORDER — LISINOPRIL 20 MG PO TABS: 20.0000 mg | ORAL_TABLET | Freq: Every morning | ORAL | Status: DC

## 2013-09-21 MED ORDER — GLUCOSE BLOOD VI STRP: ORAL_STRIP | Status: DC

## 2013-09-21 MED ORDER — SITAGLIPTIN PHOS-METFORMIN HCL 50-1000 MG PO TABS: 1.0000 | ORAL_TABLET | Freq: Two times a day (BID) | ORAL | Status: DC

## 2013-09-21 NOTE — Patient Instructions (Signed)
Diabetes Meal Planning Guide The diabetes meal planning guide is a tool to help you plan your meals and snacks. It is important for people with diabetes to manage their blood glucose (sugar) levels. Choosing the right foods and the right amounts throughout your day will help control your blood glucose. Eating right can even help you improve your blood pressure and reach or maintain a healthy weight. CARBOHYDRATE COUNTING MADE EASY When you eat carbohydrates, they turn to sugar. This raises your blood glucose level. Counting carbohydrates can help you control this level so you feel better. When you plan your meals by counting carbohydrates, you can have more flexibility in what you eat and balance your medicine with your food intake. Carbohydrate counting simply means adding up the total amount of carbohydrate grams in your meals and snacks. Try to eat about the same amount at each meal. Foods with carbohydrates are listed below. Each portion below is 1 carbohydrate serving or 15 grams of carbohydrates. Ask your dietician how many grams of carbohydrates you should eat at each meal or snack. Grains and Starches  1 slice bread.   English muffin or hotdog/hamburger bun.   cup cold cereal (unsweetened).   cup cooked pasta or rice.   cup starchy vegetables (corn, potatoes, peas, beans, winter squash).  1 tortilla (6 inches).   bagel.  1 waffle or pancake (size of a CD).   cup cooked cereal.  4 to 6 small crackers. *Whole grain is recommended. Fruit  1 cup fresh unsweetened berries, melon, papaya, pineapple.  1 small fresh fruit.   banana or mango.   cup fruit juice (4 oz unsweetened).   cup canned fruit in natural juice or water.  2 tbs dried fruit.  12 to 15 grapes or cherries. Milk and Yogurt  1 cup fat-free or 1% milk.  1 cup soy milk.  6 oz light yogurt with sugar-free sweetener.  6 oz low-fat soy yogurt.  6 oz plain yogurt. Vegetables  1 cup raw or  cup  cooked is counted as 0 carbohydrates or a "free" food.  If you eat 3 or more servings at 1 meal, count them as 1 carbohydrate serving. Other Carbohydrates   oz chips or pretzels.   cup ice cream or frozen yogurt.   cup sherbet or sorbet.  2 inch square cake, no frosting.  1 tbs honey, sugar, jam, jelly, or syrup.  2 small cookies.  3 squares of graham crackers.  3 cups popcorn.  6 crackers.  1 cup broth-based soup.  Count 1 cup casserole or other mixed foods as 2 carbohydrate servings.  Foods with less than 20 calories in a serving may be counted as 0 carbohydrates or a "free" food. You may want to purchase a book or computer software that lists the carbohydrate gram counts of different foods. In addition, the nutrition facts panel on the labels of the foods you eat are a good source of this information. The label will tell you how big the serving size is and the total number of carbohydrate grams you will be eating per serving. Divide this number by 15 to obtain the number of carbohydrate servings in a portion. Remember, 1 carbohydrate serving equals 15 grams of carbohydrate. SERVING SIZES Measuring foods and serving sizes helps you make sure you are getting the right amount of food. The list below tells how big or small some common serving sizes are.  1 oz.........4 stacked dice.  3 oz.........Deck of cards.  1 tsp........Tip   of little finger.  1 tbs......Marland Kitchen.Marland Kitchen.Thumb.  2 tbs.......Marland Kitchen.Golf ball.   cup......Marland Kitchen.Half of a fist.  1 cup.......Marland Kitchen.A fist. SAMPLE DIABETES MEAL PLAN Below is a sample meal plan that includes foods from the grain and starches, dairy, vegetable, fruit, and meat groups. A dietician can individualize a meal plan to fit your calorie needs and tell you the number of servings needed from each food group. However, controlling the total amount of carbohydrates in your meal or snack is more important than making sure you include all of the food groups at every  meal. You may interchange carbohydrate containing foods (dairy, starches, and fruits). The meal plan below is an example of a 2000 calorie diet using carbohydrate counting. This meal plan has 17 carbohydrate servings. Breakfast  1 cup oatmeal (2 carb servings).   cup light yogurt (1 carb serving).  1 cup blueberries (1 carb serving).   cup almonds. Snack  1 large apple (2 carb servings).  1 low-fat string cheese stick. Lunch  Chicken breast salad.  1 cup spinach.   cup chopped tomatoes.  2 oz chicken breast, sliced.  2 tbs low-fat [place] dressing.  12 whole-wheat crackers (2 carb servings).  12 to 15 grapes (1 carb serving).  1 cup low-fat milk (1 carb serving). Snack  1 cup carrots.   cup hummus (1 carb serving). Dinner  3 oz broiled salmon.  1 cup brown rice (3 carb servings). Snack  1  cups steamed broccoli (1 carb serving) drizzled with 1 tsp olive oil and lemon juice.  1 cup light pudding (2 carb servings). DIABETES MEAL PLANNING WORKSHEET Your dietician can use this worksheet to help you decide how many servings of foods and what types of foods are right for you.  BREAKFAST Food Group and Servings / Carb Servings Grain/Starches __________________________________ Dairy __________________________________________ Vegetable ______________________________________ Fruit ___________________________________________ Meat __________________________________________ Fat ____________________________________________ LUNCH Food Group and Servings / Carb Servings Grain/Starches ___________________________________ Dairy ___________________________________________ Fruit ____________________________________________ Meat ___________________________________________ Fat _____________________________________________ Laural GoldenINNER Food Group and Servings / Carb Servings Grain/Starches ___________________________________ Dairy  ___________________________________________ Fruit ____________________________________________ Meat ___________________________________________ Fat _____________________________________________ SNACKS Food Group and Servings / Carb Servings Grain/Starches ___________________________________ Dairy ___________________________________________ Vegetable _______________________________________ Fruit ____________________________________________ Meat ___________________________________________ Fat _____________________________________________ DAILY TOTALS Starches _________________________ Vegetable ________________________ Fruit ____________________________ Dairy ____________________________ Meat ____________________________ Fat ______________________________ Document Released: 01/16/2005 Document Revised: 07/14/2011 Document Reviewed: 11/27/2008 [employer] Patient Information 2014 [employer]. Health Maintenance, Males A healthy lifestyle and preventative care can promote health and wellness.  Maintain regular health, dental, and eye exams.  Eat a healthy diet. Foods like vegetables, fruits, whole grains, low-fat dairy products, and lean protein foods contain the nutrients you need and are low in calories. Decrease your intake of foods high in solid fats, added sugars, and salt. Get information about a proper diet from your health care provider, if necessary.  Regular physical exercise is one of the most important things you can do for your health. Most adults should get at least 150 minutes of moderate-intensity exercise (any activity that increases your heart rate and causes you to sweat) each week. In addition, most adults need muscle-strengthening exercises on 2 or more days a week.   Maintain a healthy weight. The body mass index (BMI) is a screening tool to identify possible weight problems. It provides an estimate of body fat based on height and weight. Your health care provider  can find your BMI and can help you achieve or maintain a healthy weight. For males 20 years and older:  A BMI below  18.5 is considered underweight.  A BMI of 18.5 to 24.9 is normal.  A BMI of 25 to 29.9 is considered overweight.  A BMI of 30 and above is considered obese.  Maintain normal blood lipids and cholesterol by exercising and minimizing your intake of saturated fat. Eat a balanced diet with plenty of fruits and vegetables. Blood tests for lipids and cholesterol should begin at age 47 and be repeated every 5 years. If your lipid or cholesterol levels are high, you are over 50, or you are at high risk for heart disease, you may need your cholesterol levels checked more frequently.Ongoing high lipid and cholesterol levels should be treated with medicines, if diet and exercise are not working.  If you smoke, find out from your health care provider how to quit. If you do not use tobacco, do not start.  Lung cancer screening is recommended for adults aged 47 80 years who are at high risk for developing lung cancer because of a history of smoking. A yearly low-dose CT scan of the lungs is recommended for people who have at least a 30-pack-year history of smoking and are a current smoker or have quit within the past 15 years. A pack year of smoking is smoking an average of 1 pack of cigarettes a day for 1 year (for example, a 30-pack-year history of smoking could mean smoking 1 pack a day for 30 years or 2 packs a day for 15 years). Yearly screening should continue until the smoker has stopped smoking for at least 15 years. Yearly screening should be stopped for people who develop a health problem that would prevent them from having lung cancer treatment.  If you choose to drink alcohol, do not have more than 2 drinks per day. One drink is considered to be 12 oz (360 mL) of beer, 5 oz (150 mL) of wine, or 1.5 oz (45 mL) of liquor.  Avoid use of street drugs. Do not share needles with anyone. Ask  for help if you need support or instructions about stopping the use of drugs.  High blood pressure causes heart disease and increases the risk of stroke. Blood pressure should be checked at least every 1 2 years. Ongoing high blood pressure should be treated with medicines if weight loss and exercise are not effective.  If you are 2345 47 years old, ask your health care provider if you should take aspirin to prevent heart disease.  Diabetes screening involves taking a blood sample to check your fasting blood sugar level. This should be done once every 3 years after age 47, if you are at a normal weight and without risk factors for diabetes. Testing should be considered at a younger age or be carried out more frequently if you are overweight and have at least 1 risk factor for diabetes.  Colorectal cancer can be detected and often prevented. Most routine colorectal cancer screening begins at the age of 47 and continues through age 47. However, your health care provider may recommend screening at an earlier age if you have risk factors for colon cancer. On a yearly basis, your health care provider may provide home test kits to check for hidden blood in the stool. A small camera at the end of a tube may be used to directly examine the colon (sigmoidoscopy or colonoscopy) to detect the earliest forms of colorectal cancer. Talk to your health care provider about this at age 47, when routine screening begins. A direct exam of the colon  should be repeated every 5 10 years through age 77, unless early forms of pre-cancerous polyps or small growths are found.  People who are at an increased risk for hepatitis B should be screened for this virus. You are considered at high risk for hepatitis B if:  You were born in a country where hepatitis B occurs often. Talk with your health care provider about which countries are considered high-risk.  Your parents were born in a high-risk country and you have not received a  shot to protect against hepatitis B (hepatitis B vaccine).  You have HIV or AIDS.  You use needles to inject street drugs.  You live with, or have sex with, someone who has hepatitis B.  You are a man who has sex with other men (MSM).  You get hemodialysis treatment.  You take certain medicines for conditions like cancer, organ transplantation, and autoimmune conditions.  Hepatitis C blood testing is recommended for all people born from [birth year] through 1964 and any individual with known risk factors for hepatitis C.  Healthy men should no longer receive prostate-specific antigen (PSA) blood tests as part of routine cancer screening. Talk to your health care provider about prostate cancer screening.  Testicular cancer screening is not recommended for adolescents or adult males who have no symptoms. Screening includes self-exam, a health care provider exam, and other screening tests. Consult with your health care provider about any symptoms you have or any concerns you have about testicular cancer.  Practice safe sex. Use condoms and avoid high-risk sexual practices to reduce the spread of sexually transmitted infections (STIs).  Use sunscreen. Apply sunscreen liberally and repeatedly throughout the day. You should seek shade when your shadow is shorter than you. Protect yourself by wearing long sleeves, pants, a wide-brimmed hat, and sunglasses year round, whenever you are outdoors.  Tell your health care provider of new moles or changes in moles, especially if there is a change in shape or color. Also tell your provider if a mole is larger than the size of a pencil eraser.  A one-time screening for abdominal aortic aneurysm (AAA) and surgical repair of large AAAs by ultrasound is recommended for men aged 17 75 years who are current or former smokers.  Stay current with your vaccines (immunizations). Document Released: 10/18/2007 Document Revised: 02/09/2013 Document Reviewed:  09/16/2010 [facility] Patient Information 2014 [place], [place].

## 2013-09-21 NOTE — Progress Notes (Signed)
Subjective:    Patient ID: Douglas Carr, male    DOB: March 15, 1966, 48 y.o.   MRN: [id number]  Diabetes He presents for his follow-up diabetic visit. He has type 2 diabetes mellitus. His disease course has been stable. There are no hypoglycemic associated symptoms. Pertinent negatives for hypoglycemia include no confusion or dizziness. Pertinent negatives for diabetes include no blurred vision, no chest pain, no foot paresthesias, no foot ulcerations and no visual change. There are no hypoglycemic complications. Pertinent negatives for hypoglycemia complications include no blackouts and no hospitalization. Symptoms are stable. Risk factors for coronary artery disease include diabetes mellitus, dyslipidemia, hypertension, male sex and family history. Current diabetic treatment includes diet (Pt states he "ran out medications over a yar ago and has not taken any"). He is compliant with treatment none of the time. He is following a generally healthy diet. He rarely participates in exercise.  Hypertension This is a chronic problem. The current episode started more than 1 year ago. The problem has been waxing and waning since onset. The problem is uncontrolled. Pertinent negatives include no blurred vision or chest pain. There are no associated agents to hypertension. Risk factors for coronary artery disease include diabetes mellitus, dyslipidemia, family history and male gender. Past treatments include nothing ("ran out a year ago"). Compliance problems include exercise and diet.  There is no history of kidney disease or CAD/MI.  Hyperlipidemia This is a chronic problem. The current episode started more than 1 year ago. The problem is uncontrolled. Recent lipid tests were reviewed and are high. Exacerbating diseases include diabetes. Pertinent negatives include no chest pain or leg pain. The current treatment provides no improvement of lipids. Compliance problems include adherence to diet and adherence  to exercise.  Risk factors for coronary artery disease include diabetes mellitus, dyslipidemia, family history, male sex and hypertension.      Review of Systems  HENT: Negative.   Eyes: Negative for blurred vision.  Respiratory: Negative.   Cardiovascular: Negative for chest pain.  Genitourinary: Negative.   Musculoskeletal: Negative.   Neurological: Negative for dizziness.  Psychiatric/Behavioral: Negative for confusion.  All other systems reviewed and are negative.      Objective:   Physical Exam  Vitals reviewed. Constitutional: He is oriented to person, place, and time. He appears well-developed and well-nourished. No distress.  HENT:  Head: Normocephalic.  Right Ear: External ear normal.  Left Ear: External ear normal.  Mouth/Throat: Oropharynx is clear and moist.  Eyes: Pupils are equal, round, and reactive to light. Right eye exhibits no discharge. Left eye exhibits no discharge.  Cardiovascular: Normal rate, regular rhythm, normal heart sounds and intact distal pulses.   No murmur heard. Pulmonary/Chest: Effort normal and breath sounds normal. No respiratory distress. He has no wheezes.  Abdominal: Soft. Bowel sounds are normal. He exhibits no distension. There is no tenderness.  Musculoskeletal: Normal range of motion. He exhibits no edema and no tenderness.  Neurological: He is alert and oriented to person, place, and time. He has normal reflexes. No cranial nerve deficit.  Skin: Skin is warm and dry. No rash noted. No erythema.  Psychiatric: He has a normal mood and affect. His behavior is normal. Judgment and thought content normal.    Results for orders placed in visit on 09/21/13  POCT GLYCOSYLATED HEMOGLOBIN (HGB A1C)      Result Value Ref Range   Hemoglobin A1C >14.0    GLUCOSE, POCT (MANUAL RESULT ENTRY)      Result Value  Ref Range   POC Glucose 315 (*) 70 - 99 mg/dl     BP 150/75  Pulse 93  Temp(Src) 98.1 F (36.7 C) (Oral)  Ht 5' 10"  (1.778 m)   Wt 215 lb 12.8 oz (97.886 kg)  BMI 30.96 kg/m2     Assessment & Plan:  1. Hypertension - CMP14+EGFR  2. Diabetes mellitus -Need appointment with Tammy -Pt educated on importance of checking blood sugars and taking medications as prescribed - POCT glycosylated hemoglobin (Hb A1C) - POCT glucose (manual entry)  3. Hyperlipidemia -Pt noncompliant with meds-Has not taken any in last year -If cholesterol levels are high will restart crestor  - Lipid panel  4. Unspecified vitamin D deficiency - Vit D  25 hydroxy (rtn osteoporosis monitoring)  Meds ordered this encounter  Medications  . lisinopril (PRINIVIL,ZESTRIL) 20 MG tablet    Sig: Take 1 tablet (20 mg total) by mouth every morning.    Dispense:  30 tablet    Refill:  3    Order Specific Question:  Supervising Provider    Answer:  Chipper Herb [[id number]]  . sitaGLIPtin-metformin (JANUMET) 50-1000 MG per tablet    Sig: Take 1 tablet by mouth 2 (two) times daily with a meal.    Dispense:  30 tablet    Refill:  3    Order Specific Question:  Supervising Provider    Answer:  Chipper Herb [[id number]]  . glucose blood (ONETOUCH VERIO) test strip    Sig: Use as instructed    Dispense:  100 each    Refill:  12    Order Specific Question:  Supervising Provider    Answer:  Chipper Herb [[id number]]   Continue all meds Labs pending Health Maintenance reviewed Diet and exercise encouraged RTO to see Tammy asap  Evelina Dun, FNP

## 2013-09-22 LAB — CMP14+EGFR
ALBUMIN: 4.8 g/dL (ref 3.5–5.5)
ALT: 33 IU/L (ref 0–44)
AST: 17 IU/L (ref 0–40)
Albumin/Globulin Ratio: 1.9 (ref 1.1–2.5)
Alkaline Phosphatase: 69 IU/L (ref 39–117)
BUN/Creatinine Ratio: 16 (ref 9–20)
BUN: 15 mg/dL (ref 6–24)
CALCIUM: 9.8 mg/dL (ref 8.7–10.2)
CHLORIDE: 98 mmol/L (ref 97–108)
CO2: 23 mmol/L (ref 18–29)
Creatinine, Ser: 0.93 mg/dL (ref 0.76–1.27)
GFR calc non Af Amer: 97 mL/min/{1.73_m2} (ref >59)
GFR, EST AFRICAN AMERICAN: 113 mL/min/{1.73_m2} (ref >59)
GLUCOSE: 314 mg/dL — AB (ref 65–99)
Globulin, Total: 2.5 g/dL (ref 1.5–4.5)
Potassium: 5 mmol/L (ref 3.5–5.2)
Sodium: 137 mmol/L (ref 134–144)
TOTAL PROTEIN: 7.3 g/dL (ref 6.0–8.5)
Total Bilirubin: 0.3 mg/dL (ref 0.0–1.2)

## 2013-09-22 LAB — LIPID PANEL
CHOLESTEROL TOTAL: 305 mg/dL — AB (ref 100–199)
Chol/HDL Ratio: 10.2 ratio units — ABNORMAL HIGH (ref 0.0–5.0)
HDL: 30 mg/dL — AB (ref >39)
Triglycerides: 522 mg/dL — ABNORMAL HIGH (ref 0–149)

## 2013-09-22 LAB — VITAMIN D 25 HYDROXY (VIT D DEFICIENCY, FRACTURES): Vit D, 25-Hydroxy: 11.5 ng/mL — ABNORMAL LOW (ref 30.0–100.0)

## 2013-09-23 ENCOUNTER — Other Ambulatory Visit: Payer: Self-pay | Admitting: Family

## 2013-09-23 MED ORDER — ROSUVASTATIN CALCIUM 40 MG PO TABS: 40.0000 mg | ORAL_TABLET | Freq: Every day | ORAL | Status: DC

## 2013-10-10 ENCOUNTER — Ambulatory Visit: Payer: BC Managed Care – PPO

## 2013-10-19 ENCOUNTER — Telehealth: Payer: Self-pay | Admitting: Pharmacist

## 2013-10-19 ENCOUNTER — Ambulatory Visit (INDEPENDENT_AMBULATORY_CARE_PROVIDER_SITE_OTHER): Payer: BC Managed Care – PPO | Admitting: Pharmacist

## 2013-10-19 ENCOUNTER — Encounter: Payer: Self-pay | Admitting: Pharmacist

## 2013-10-19 VITALS — BP 158/90 | HR 80 | Ht 70.0 in | Wt 213.0 lb

## 2013-10-19 DIAGNOSIS — E785 Hyperlipidemia, unspecified: Secondary | ICD-10-CM

## 2013-10-19 DIAGNOSIS — E119 Type 2 diabetes mellitus without complications: Secondary | ICD-10-CM

## 2013-10-19 DIAGNOSIS — I10 Essential (primary) hypertension: Secondary | ICD-10-CM

## 2013-10-19 MED ORDER — INSULIN PEN NEEDLE 32G X 4 MM MISC: Status: DC

## 2013-10-19 MED ORDER — BUSPIRONE HCL 10 MG PO TABS: 10.0000 mg | ORAL_TABLET | Freq: Two times a day (BID) | ORAL | Status: DC

## 2013-10-19 MED ORDER — INSULIN DETEMIR 100 UNIT/ML FLEXPEN: PEN_INJECTOR | SUBCUTANEOUS | Status: DC

## 2013-10-19 NOTE — Patient Instructions (Signed)
Start Levemir 20 units daily in morning. Check blood glucose each morning.  If blood glucose if over 120 in the morning increase Levemir by 2 units (every 2 days).  Goal is to have blood glucose less than 120 in the morning.  Do not go over 60 units.  If you have low blood glucose (less than 70) then back down to the previous day dose.   Diabetes and Standards of Medical Care  Diabetes is complicated. You may find that your diabetes team includes a dietitian, nurse, diabetes educator, eye doctor, and more. To help everyone know what is going on and to help you get the care you deserve, the following schedule of care was developed to help keep you on track. Below are the tests, exams, vaccines, medicines, education, and plans you will need.  Blood Glucose Goals Prior to meals = 80 - 130 Within 2 hours of the start of a meal = less than 180  HbA1c test (goal is less than 7.0%) This test shows how well you have controlled your glucose over the past 2 3 months. It is used to see if your diabetes management plan needs to be adjusted.   It is performed at least 2 times a year if you are meeting treatment goals.  It is performed 4 times a year if therapy has changed or if you are not meeting treatment goals.   Blood pressure test  This test is performed at every routine medical visit. The goal is less than 140/90 mmHg for most people, but 130/80 mmHg in some cases. Ask your health care provider about your goal. Dental exam  Follow up with the dentist regularly. Eye exam  If you are diagnosed with type 1 diabetes as a child, get an exam upon reaching the age of 31 years or older and have had diabetes for 3 5 years. Yearly eye exams are recommended after that initial eye exam.  If you are diagnosed with type 1 diabetes as an adult, get an exam within 5 years of diagnosis and then yearly.  If you are diagnosed with type 2 diabetes, get an exam as soon as possible after the diagnosis and then  yearly. Foot care exam  Visual foot exams are performed at every routine medical visit. The exams check for cuts, injuries, or other problems with the feet.  A comprehensive foot exam should be done yearly. This includes visual inspection as well as assessing foot pulses and testing for loss of sensation.  Check your feet nightly for cuts, injuries, or other problems with your feet. Tell your health care provider if anything is not healing. Kidney function test (urine microalbumin)  This test is performed once a year.  Type 1 diabetes: The first test is performed 5 years after diagnosis.  Type 2 diabetes: The first test is performed at the time of diagnosis.  A serum creatinine and estimated glomerular filtration rate (eGFR) test is done once a year to assess the level of chronic kidney disease (CKD), if present. Lipid profile (cholesterol, HDL, LDL, triglycerides)  Performed every 5 years for most people.  The goal for LDL is less than 100 mg/dL. If you are at high risk, the goal is less than 70 mg/dL.  The goal for HDL is 40 mg/dL 50 mg/dL for men and 50 mg/dL 60 mg/dL for women. An HDL cholesterol of 60 mg/dL or higher gives some protection against heart disease.  The goal for triglycerides is less than 150 mg/dL. Influenza  vaccine, pneumococcal vaccine, and hepatitis B vaccine  The influenza vaccine is recommended yearly.  The pneumococcal vaccine is generally given once in a lifetime. However, there are some instances when another vaccination is recommended. Check with your health care provider.  The hepatitis B vaccine is also recommended for adults with diabetes. Diabetes self-management education  Education is recommended at diagnosis and ongoing as needed. Treatment plan  Your treatment plan is reviewed at every medical visit.

## 2013-10-19 NOTE — Telephone Encounter (Signed)
Patient can come in today at 2:30pm - will see him then.  Wife notified of appt

## 2013-10-19 NOTE — Progress Notes (Signed)
Subjective:    Douglas Carr is a 48 y.o. male who presents for an initial evaluation of Type 2 diabetes mellitus.  Current symptoms/problems include hyperglycemia and polyuria and have been worsening. Symptoms have been present for 3 months. Patient was initially diagnosed with type 2 DM in his 6030's.    He is currently taking Janumet 50/1000mg  1 tablet twice a day.  He has Novolog on his medication profile but he states he has not taken in over 1 month IN the past he has taken Lantus 20 units qam and 25 units qpm though he has not had any Lantus since late 2014.  Known diabetic complications: nephropathy, impotence and cardiovascular disease Cardiovascular risk factors: diabetes mellitus, dyslipidemia, hypertension and male gender  Eye exam current (within one year): no Weight trend: stable Prior visit with dietician: no Current diet: in general, an "unhealthy" diet Current exercise: none  Current monitoring regimen: home blood tests - does check but not consistantly - has not checked in last 3 days per patient Home blood sugar records: 200's to 400's Any episodes of hypoglycemia? no  Is He on ACE inhibitor or angiotensin II receptor blocker?  Yes  lisinopril (Prinivil)      Patient also states that he is "trying to decide if he should restart xanax."  He is having increased anxiety and mood swings at work.  He states that the xanax helped with anxiety but made him more moody.  He has not tried anything else for anxiety.  Objective:    BP 158/90  Pulse 80  Ht 5\' 10"  (1.778 m)  Wt 213 lb (96.616 kg)  BMI 30.56 kg/m2  Lab Review Glucose (mg/dL)  Date Value  4/09/81195/20/2015 314*     Glucose, Bld (mg/dL)  Date Value  14/7/829512/01/2012 513*  12/15/2011 382*  10/23/2011 240*     CO2 (mmol/L)  Date Value  09/21/2013 23   04/12/2012 26   12/15/2011 25      BUN (mg/dL)  Date Value  6/21/30865/20/2015 15   04/12/2012 14   12/15/2011 12   10/23/2011 18      Creatinine, Ser (mg/dL)  Date  Value  5/78/46965/20/2015 0.93   04/12/2012 0.9   12/15/2011 0.64    Assessment:    Diabetes Mellitus type II, under poor control Hypertension Hyperlipidemia - specifically elevated Tg related to uncontrolled DM Anxiety.    Plan:    1.  Rx changes: start Levemir 20 units daily - patient to increase by 2 units every 2 day until reaches fasting goal of less than 120.  (or if he has hypoglycemia)  Discussed anxiety with Jannifer Rodneyhristy Hawks and decided to start Buspar 10mg  1 tablet BID - called to [facility].  2.  Education: Reviewed 'ABCs' of diabetes management (respective goals in parentheses):  A1C (<7), blood pressure (<130/80), and cholesterol (LDL <100). 3.  Compliance at present is estimated to be poor. Efforts to improve compliance (if necessary) will be directed at dietary modifications: limiting high CHO containing foods to recommended serving sizes, regular blood sugar monitoring: three times daily and increased compliance with medication (gave discount cards to assist with cost of medication). 4. Follow up: 1 month   Total time spent with patient = 60 minutes.  Douglas Carr, PharmD, CPP

## 2013-10-27 ENCOUNTER — Telehealth: Payer: Self-pay | Admitting: Pharmacist

## 2013-10-27 MED ORDER — INSULIN DETEMIR 100 UNIT/ML FLEXPEN: PEN_INJECTOR | SUBCUTANEOUS | Status: DC

## 2013-10-27 NOTE — Telephone Encounter (Signed)
done

## 2013-11-14 ENCOUNTER — Ambulatory Visit (INDEPENDENT_AMBULATORY_CARE_PROVIDER_SITE_OTHER): Payer: BC Managed Care – PPO | Admitting: Family Medicine

## 2013-11-14 ENCOUNTER — Encounter: Payer: Self-pay | Admitting: Family Medicine

## 2013-11-14 VITALS — BP 170/78 | HR 97 | Temp 98.9°F | Ht 70.0 in | Wt 220.0 lb

## 2013-11-14 DIAGNOSIS — L723 Sebaceous cyst: Secondary | ICD-10-CM

## 2013-11-14 DIAGNOSIS — Z23 Encounter for immunization: Secondary | ICD-10-CM

## 2013-11-14 MED ORDER — HYDROCODONE-ACETAMINOPHEN 5-325 MG PO TABS: 1.0000 | ORAL_TABLET | Freq: Four times a day (QID) | ORAL | Status: DC | PRN

## 2013-11-14 MED ORDER — AMOXICILLIN 875 MG PO TABS: 875.0000 mg | ORAL_TABLET | Freq: Two times a day (BID) | ORAL | Status: DC

## 2013-11-14 NOTE — Progress Notes (Signed)
   Subjective:    Patient ID: Douglas Carr, male    DOB: October 31, 1965, 48 y.o.   MRN: [id number]  HPI  This 48 y.o. male presents for evaluation of cyst on back that is starting to hurt.  Review of Systems C/o cyst on back   No chest pain, SOB, HA, dizziness, vision change, N/V, diarrhea, constipation, dysuria, urinary urgency or frequency, myalgias, arthralgias or rash.  Objective:   Physical Exam  Vital signs noted  Well developed well nourished male.  HEENT - Head atraumatic Normocephalic                Eyes  Respiratory - Lungs CTA bilateral Cardiac - RRR S1 and S2 without murmur GI - Abdomen soft Nontender and bowel sounds active x 4 Extremities - No edema. Neuro - Grossly intact. Skin - Sebaceous cyst with erythema on upper back    procedure - Sebaceous cyst cleaned with betadine and then ETOH and then anesthetized with 5 cc's of lidocaine w/o epinephrine.  Once adequated anesthesia a horizontal incision is made and White waxy debrid and cyst removed along with serous sanguin drainage.  The wound is further explored for loculations with curved hemostats and then packing and dressing applied. Assessment & Plan:  Sebaceous cyst - Plan: amoxicillin (AMOXIL) 875 MG tablet, Tdap vaccine greater than or equal to 7yo IM, HYDROcodone-acetaminophen (NORCO/VICODIN) 5-325 MG per tablet  Follow up in 1 day for wound check  Deatra CanterWilliam J Oxford FNP

## 2013-11-15 ENCOUNTER — Encounter: Payer: Self-pay | Admitting: Family Medicine

## 2013-11-15 ENCOUNTER — Ambulatory Visit (INDEPENDENT_AMBULATORY_CARE_PROVIDER_SITE_OTHER): Payer: BC Managed Care – PPO | Admitting: Family Medicine

## 2013-11-15 VITALS — BP 135/71 | HR 82 | Temp 98.6°F | Ht 70.0 in | Wt 215.8 lb

## 2013-11-15 DIAGNOSIS — L723 Sebaceous cyst: Secondary | ICD-10-CM

## 2013-11-15 NOTE — Progress Notes (Signed)
   Subjective:    Patient ID: Margarita SermonsClifford M Pulliam, male    DOB: 1965/06/23, 48 y.o.   MRN: [id number]  HPI  Patient here for wound check.  He is taking abx's.  He is doing better and not hurting.  The wound was packed.  Review of Systems No chest pain, SOB, HA, dizziness, vision change, N/V, diarrhea, constipation, dysuria, urinary urgency or frequency, myalgias, arthralgias or rash.     Objective:   Physical Exam  Incision and drainage wound with intact packing.  Packing removed and the surrounding area around the wound is pressed and only serous sanguin drainage is removed.  Dressing is applied.        Assessment & Plan:  Sebaceous cyst Packing removed and bulky 4x4 dressing applied, continue abx's Follow up prn  Deatra CanterWilliam J Aadan Chenier FNP

## 2013-12-05 ENCOUNTER — Ambulatory Visit (INDEPENDENT_AMBULATORY_CARE_PROVIDER_SITE_OTHER): Payer: BC Managed Care – PPO | Admitting: Pharmacist

## 2013-12-05 ENCOUNTER — Encounter: Payer: Self-pay | Admitting: Pharmacist

## 2013-12-05 VITALS — BP 148/80 | HR 78 | Ht 70.0 in | Wt 216.0 lb

## 2013-12-05 DIAGNOSIS — E1165 Type 2 diabetes mellitus with hyperglycemia: Secondary | ICD-10-CM

## 2013-12-05 DIAGNOSIS — E119 Type 2 diabetes mellitus without complications: Secondary | ICD-10-CM

## 2013-12-05 DIAGNOSIS — E785 Hyperlipidemia, unspecified: Secondary | ICD-10-CM

## 2013-12-05 DIAGNOSIS — I10 Essential (primary) hypertension: Secondary | ICD-10-CM

## 2013-12-05 MED ORDER — INSULIN ASPART PROT & ASPART (70-30 MIX) 100 UNIT/ML ~~LOC~~ SUSP: SUBCUTANEOUS | Status: DC

## 2013-12-05 NOTE — Progress Notes (Signed)
Subjective:    Douglas Carr is a 48 y.o. male who presents for reevaluation of Type 2 diabetes mellitus.  Current symptoms/problems include hyperglycemia and polyuria and have been Improving over the last month. Patient was initially diagnosed with type 2 DM in his 5230's.    He is currently taking Janumet 50/1000mg  1 tablet twice a day and Lantus 60 units qam.  Known diabetic complications: nephropathy, impotence and cardiovascular disease Cardiovascular risk factors: diabetes mellitus, dyslipidemia, hypertension and male gender  Eye exam current (within one year): no Weight trend: stable Prior visit with dietician: no Current diet: in general, a "healthy" diet  , on average, 3 meals per day, trying to limit serving sizes of high CHO foods Current exercise: none  Current monitoring regimen: home blood tests - 2 times daily Home blood sugar records: fasting range: 240 to 250 and usually 300's at bedtime.  Although these readings are not at goal they are about 100 points better than last visit in June 2015. Any episodes of hypoglycemia? no  Is He on ACE inhibitor or angiotensin II receptor blocker?  Yes  lisinopril (Prinivil) 20mg  1 tablet daily     Regarding anxiety - patient states that buspurione started in June 2015 has worked well.  Decreased mood swings and anxiousness and he is sleeping better.  Objective:    BP 148/80  Pulse 78  Ht 5\' 10"  (1.778 m)  Wt 216 lb (97.977 kg)  BMI 30.99 kg/m2  Lab Review Glucose (mg/dL)  Date Value  9/52/84135/20/2015 314*     Glucose, Bld (mg/dL)  Date Value  24/4/010212/01/2012 513*  12/15/2011 382*  10/23/2011 240*     CO2 (mmol/L)  Date Value  09/21/2013 23   04/12/2012 26   12/15/2011 25      BUN (mg/dL)  Date Value  7/25/36645/20/2015 15   04/12/2012 14   12/15/2011 12   10/23/2011 18      Creatinine, Ser (mg/dL)  Date Value  4/03/47425/20/2015 0.93   04/12/2012 0.9   12/15/2011 0.64    Assessment:    Diabetes Mellitus type II, under poor  control Hypertension - elevated today but was at goal last visit when he saw Douglas Carr Hyperlipidemia - specifically elevated Tg related to uncontrolled DM Anxiety - improved.    Plan:    1.  Rx changes: Discontinue Lantus.  Start Novolog Mix 70/30 35 units BID prior to breakfast and supper  We discussed the pro and cons of Mix insulin (hypoglycemia, importance of meal scheduling, etc)  Due to patient's work (works for [employer]) he would not be able to keep insulin pen at room temp.  Continue Buspar 10mg  1 tablet BID  2.  Education: Reviewed 'ABCs' of diabetes management (respective goals in parentheses):  A1C (<7), blood pressure (<130/80), and cholesterol (LDL <100). 3.  Compliance at present is improving. Efforts to improve compliance (if necessary) will be directed at dietary modifications: limiting high CHO containing foods to recommended serving sizes, regular blood sugar monitoring: three times daily and continued increased compliance with medication 4. Follow up: 1 month - to see Douglas Carr to have labs checked  Total time spent with patient = 40 minutes.  Douglas Carr, PharmD, CPP, CDE

## 2013-12-05 NOTE — Patient Instructions (Signed)
Stop Lantus  Start Novolog Mix 70/30 - Inject 35 units each morning before breakfast and each evening before supper.     Hypoglycemia Hypoglycemia occurs when the glucose in your blood is too low. Glucose is a type of sugar that is your body's main energy source. Hormones, such as insulin and glucagon, control the level of glucose in the blood. Insulin lowers blood glucose and glucagon increases blood glucose. Having too much insulin in your blood stream, or not eating enough food containing sugar, can result in hypoglycemia. Hypoglycemia can happen to people with or without diabetes. It can develop quickly and can be a medical emergency.  CAUSES   Missing or delaying meals.  Not eating enough carbohydrates at meals.  Taking too much diabetes medicine.  Not timing your oral diabetes medicine or insulin doses with meals, snacks, and exercise.  Nausea and vomiting.  Certain medicines.  Severe illnesses, such as hepatitis, kidney disorders, and certain eating disorders.  Increased activity or exercise without eating something extra or adjusting medicines.  Drinking too much alcohol.  A nerve disorder that affects body functions like your heart rate, blood pressure, and digestion (autonomic neuropathy).  A condition where the stomach muscles do not function properly (gastroparesis). Therefore, medicines and food may not absorb properly.  Rarely, a tumor of the pancreas can produce too much insulin. SYMPTOMS   Hunger.  Sweating (diaphoresis).  Change in body temperature.  Shakiness.  Headache.  Anxiety.  Lightheadedness.  Irritability.  Difficulty concentrating.  Dry mouth.  Tingling or numbness in the hands or feet.  Restless sleep or sleep disturbances.  Altered speech and coordination.  Change in mental status.  Seizures or prolonged convulsions.  Combativeness.  Drowsiness (lethargic).  Weakness.  Increased heart rate or  palpitations.  Confusion.  Pale, gray skin color.  Blurred or double vision.  Fainting. DIAGNOSIS  A physical exam and medical history will be performed. Your caregiver may make a diagnosis based on your symptoms. Blood tests and other lab tests may be performed to confirm a diagnosis. Once the diagnosis is made, your caregiver will see if your signs and symptoms go away once your blood glucose is raised.  TREATMENT  Usually, you can easily treat your hypoglycemia when you notice symptoms.  Check your blood glucose. If it is less than 70 mg/dl, take one of the following:   3-4 glucose tablets.    cup juice.    cup regular soda.   1 cup skim milk.   -1 tube of glucose gel.   5-6 hard candies.   Avoid high-fat drinks or food that may delay a rise in blood glucose levels.  Do not take more than the recommended amount of sugary foods, drinks, gel, or tablets. Doing so will cause your blood glucose to go too high.   Wait 10-15 minutes and recheck your blood glucose. If it is still less than 70 mg/dl or below your target range, repeat treatment.   Eat a snack if it is more than 1 hour until your next meal.  There may be a time when your blood glucose may go so low that you are unable to treat yourself at home when you start to notice symptoms. You may need someone to help you. You may even faint or be unable to swallow. If you cannot treat yourself, someone will need to bring you to the hospital.  HOME CARE INSTRUCTIONS  If you have diabetes, follow your diabetes management plan by:  Taking your  medicines as directed.  Following your exercise plan.  Following your meal plan. Do not skip meals. Eat on time.  Testing your blood glucose regularly. Check your blood glucose before and after exercise. If you exercise longer or different than usual, be sure to check blood glucose more frequently.  Wearing your medical alert jewelry that says you have  diabetes.  Identify the cause of your hypoglycemia. Then, develop ways to prevent the recurrence of hypoglycemia.  Do not take a hot bath or shower right after an insulin shot.  Always carry treatment with you. Glucose tablets are the easiest to carry.  If you are going to drink alcohol, drink it only with meals.  Tell friends or family members ways to keep you safe during a seizure. This may include removing hard or sharp objects from the area or turning you on your side.  Maintain a healthy weight. SEEK MEDICAL CARE IF:   You are having problems keeping your blood glucose in your target range.  You are having frequent episodes of hypoglycemia.  You feel you might be having side effects from your medicines.  You are not sure why your blood glucose is dropping so low.  You notice a change in vision or a new problem with your vision. SEEK IMMEDIATE MEDICAL CARE IF:   Confusion develops.  A change in mental status occurs.  The inability to swallow develops.  Fainting occurs. Document Released: 04/21/2005 Document Revised: 04/26/2013 Document Reviewed: 08/18/2011 [facility] Patient Information 2015 [place], [place]. This information is not intended to replace advice given to you by your health care provider. Make sure you discuss any questions you have with your health care provider.

## 2013-12-12 ENCOUNTER — Other Ambulatory Visit: Payer: Self-pay | Admitting: Family

## 2014-01-03 ENCOUNTER — Ambulatory Visit: Payer: Self-pay | Admitting: Family

## 2014-03-27 ENCOUNTER — Other Ambulatory Visit: Payer: Self-pay | Admitting: Family

## 2014-03-27 NOTE — Telephone Encounter (Signed)
Refill one time; note needs to be seen in December, last visit was in Aug by Henrene Pastorammy Eckard.

## 2014-04-11 ENCOUNTER — Telehealth: Payer: Self-pay | Admitting: Family Medicine

## 2014-04-11 ENCOUNTER — Ambulatory Visit (INDEPENDENT_AMBULATORY_CARE_PROVIDER_SITE_OTHER): Payer: BC Managed Care – PPO | Admitting: Family Medicine

## 2014-04-11 ENCOUNTER — Encounter: Payer: Self-pay | Admitting: Family Medicine

## 2014-04-11 ENCOUNTER — Ambulatory Visit (INDEPENDENT_AMBULATORY_CARE_PROVIDER_SITE_OTHER): Payer: BC Managed Care – PPO | Admitting: *Deleted

## 2014-04-11 VITALS — BP 151/79 | HR 104 | Temp 97.4°F | Ht 70.0 in | Wt 220.2 lb

## 2014-04-11 DIAGNOSIS — E1136 Type 2 diabetes mellitus with diabetic cataract: Secondary | ICD-10-CM

## 2014-04-11 DIAGNOSIS — Z23 Encounter for immunization: Secondary | ICD-10-CM

## 2014-04-11 DIAGNOSIS — H269 Unspecified cataract: Secondary | ICD-10-CM

## 2014-04-11 MED ORDER — INSULIN ASPART PROT & ASPART (70-30 MIX) 100 UNIT/ML ~~LOC~~ SUSP: SUBCUTANEOUS | Status: DC

## 2014-04-11 NOTE — Progress Notes (Signed)
   Subjective:    Patient ID: Douglas Carr, male    DOB: 1965-09-04, [age] y.o.   MRN: [id number]  HPI 48 year old man with recent history of decreased vision in his left eye. He is a diabetic whose sugars are not well-controlled and I thought that perhaps his decreased vision is related but it is unilateral in the left eye only. There is been no history of trauma. He does work out in the sun a lot    Review of Systems  Constitutional: Negative.   HENT: Negative.   Eyes: Positive for visual disturbance.  Respiratory: Negative.  Negative for shortness of breath.   Cardiovascular: Negative.  Negative for chest pain and leg swelling.  Gastrointestinal: Negative.   Genitourinary: Negative.   Musculoskeletal: Negative.   Skin: Negative.   Neurological: Negative.   Psychiatric/Behavioral: Negative.   All other systems reviewed and are negative.      Objective:   Physical Exam  Eyes:  Funduscopic exam of the left eye reveals a cataract in the central complaint of vision and I suspect this is the cause for his change in vision    BP 151/79 mmHg  Pulse 104  Temp(Src) 97.4 F (36.3 C) (Oral)  Ht 5\' 10"  (1.778 m)  Wt 220 lb 3.2 oz (99.882 kg)  BMI 31.60 kg/m2      Assessment & Plan:  1. Cataract - Ambulatory referral to Ophthalmology   Frederica KusterStephen M Miller MD 2. Cataract associated with type 2 diabetes mellitus

## 2014-04-11 NOTE — Telephone Encounter (Signed)
Appointment given for today with Douglas Carr.

## 2014-04-11 NOTE — Patient Instructions (Signed)
Cataract °A cataract is a clouding of the lens of the eye. When a lens becomes cloudy, vision is reduced based on the degree and nature of the clouding. Many cataracts reduce vision to some degree. Some cataracts make people more near-sighted as they develop. Other cataracts increase glare. Cataracts that are ignored and become worse can sometimes look white. The white color can be seen through the pupil. °CAUSES  °· Aging. However, cataracts may occur at any age, even in newborns. °· Certain drugs. °· Trauma to the eye. °· Certain diseases such as diabetes. °· Specific eye diseases such as chronic inflammation inside the eye or a sudden attack of a rare form of glaucoma. °· Inherited or acquired medical problems. °SYMPTOMS  °· Gradual, progressive drop in vision in the affected eye. °· Severe, rapid visual loss. This most often happens when trauma is the cause. °DIAGNOSIS  °To detect a cataract, an eye doctor examines the lens. Cataracts are best diagnosed with an exam of the eyes with the pupils enlarged (dilated) by drops.  °TREATMENT  °For an early cataract, vision may improve by using different eyeglasses or stronger lighting. If that does not help your vision, surgery is the only effective treatment. A cataract needs to be surgically removed when vision loss interferes with your everyday activities, such as driving, reading, or watching TV. A cataract may also have to be removed if it prevents examination or treatment of another eye problem. Surgery removes the cloudy lens and usually replaces it with a substitute lens (intraocular lens, IOL).  °At a time when both you and your doctor agree, the cataract will be surgically removed. If you have cataracts in both eyes, only one is usually removed at a time. This allows the operated eye to heal and be out of danger from any possible problems after surgery (such as infection or poor wound healing). In rare cases, a cataract may be doing damage to your eye. In  these cases, your caregiver may advise surgical removal right away. The vast majority of people who have cataract surgery have better vision afterward. °HOME CARE INSTRUCTIONS  °If you are not planning surgery, you may be asked to do the following: °· Use different eyeglasses. °· Use stronger or brighter lighting. °· Ask your eye doctor about reducing your medicine dose or changing medicines if it is thought that a medicine caused your cataract. Changing medicines does not make the cataract go away on its own. °· Become familiar with your surroundings. Poor vision can lead to injury. Avoid bumping into things on the affected side. You are at a higher risk for tripping or falling. °· Exercise extreme care when driving or operating machinery. °· Wear sunglasses if you are sensitive to bright light or experiencing problems with glare. °SEEK IMMEDIATE MEDICAL CARE IF:  °· You have a worsening or sudden vision loss. °· You notice redness, swelling, or increasing pain in the eye. °· You have a fever. °Document Released: 04/21/2005 Document Revised: 07/14/2011 Document Reviewed: 12/13/2010 °ExitCare® Patient Information ©2015 [employer]. This information is not intended to replace advice given to you by your health care provider. Make sure you discuss any questions you have with your health care provider. ° °

## 2014-04-17 ENCOUNTER — Encounter: Payer: Self-pay | Admitting: Pharmacist

## 2014-04-17 ENCOUNTER — Ambulatory Visit (INDEPENDENT_AMBULATORY_CARE_PROVIDER_SITE_OTHER): Payer: BC Managed Care – PPO | Admitting: Pharmacist

## 2014-04-17 VITALS — BP 130/80 | HR 88 | Ht 70.0 in | Wt 220.0 lb

## 2014-04-17 DIAGNOSIS — E1169 Type 2 diabetes mellitus with other specified complication: Secondary | ICD-10-CM

## 2014-04-17 DIAGNOSIS — I10 Essential (primary) hypertension: Secondary | ICD-10-CM

## 2014-04-17 DIAGNOSIS — E785 Hyperlipidemia, unspecified: Secondary | ICD-10-CM

## 2014-04-17 DIAGNOSIS — Z794 Long term (current) use of insulin: Secondary | ICD-10-CM

## 2014-04-17 DIAGNOSIS — E119 Type 2 diabetes mellitus without complications: Secondary | ICD-10-CM

## 2014-04-17 MED ORDER — INSULIN DETEMIR 100 UNIT/ML FLEXPEN: 60.0000 [IU] | PEN_INJECTOR | Freq: Every day | SUBCUTANEOUS | Status: DC

## 2014-04-17 MED ORDER — ROSUVASTATIN CALCIUM 40 MG PO TABS: 40.0000 mg | ORAL_TABLET | Freq: Every day | ORAL | Status: DC

## 2014-04-17 MED ORDER — INSULIN ASPART 100 UNIT/ML FLEXPEN: PEN_INJECTOR | SUBCUTANEOUS | Status: DC

## 2014-04-17 MED ORDER — BUSPIRONE HCL 15 MG PO TABS: 15.0000 mg | ORAL_TABLET | Freq: Three times a day (TID) | ORAL | Status: DC

## 2014-04-17 NOTE — Progress Notes (Signed)
Subjective:    Douglas Carr is a 48 y.o. male who presents for reevaluation of Type 2 diabetes mellitus.  Current symptoms/problems include hyperglycemia and polyuria and have been worsening over the last month. Patient was initially diagnosed with type 2 DM in his 2330's.    He is currently taking Levemir 60 units qam.  He was suppose to take novolog 70/30 but patient states that he ran out and that it was too difficult to remember to take on time.   He also has been out of Janumet - he feels that this did not decrease BG.  Known diabetic complications: nephropathy, impotence and cardiovascular disease Cardiovascular risk factors: diabetes mellitus, dyslipidemia, hypertension and male gender  Eye exam current (within one year): no Weight trend: stable Prior visit with dietician: no Current diet: in general, a "healthy" diet  , on average, 3 meals per day, trying to limit serving sizes of high CHO foods Current exercise: none  Current monitoring regimen: home blood tests - 2 times daily Home blood sugar records: fasting range: 240 to 250 and usually 300's at bedtime.  Although these readings are not at goal lowest BG reading = 220;  Highest BG reading = 523 Any episodes of hypoglycemia? no  Is He on ACE inhibitor or angiotensin II receptor blocker?  Yes  lisinopril (Prinivil) 20mg  1 tablet daily     Regarding anxiety - patient states that buspurione started in June 2015 has worked ok but effects have been waning over last few months - has begun to have more sleeplessness nights.   Objective:    There were no vitals taken for this visit.  Lab Review GLUCOSE (mg/dL)  Date Value  47/82/956205/20/2015 314*   GLUCOSE, BLD (mg/dL)  Date Value  13/08/657812/01/2012 513*  12/15/2011 382*  10/23/2011 240*   CO2  Date Value  09/21/2013 23 mmol/L  04/12/2012 26 mEq/L  12/15/2011 25 mEq/L   BUN (mg/dL)  Date Value  46/96/295205/20/2015 15  04/12/2012 14  12/15/2011 12  10/23/2011 18   CREATININE,  SER (mg/dL)  Date Value  84/13/244005/20/2015 0.93  04/12/2012 0.9  12/15/2011 0.64   Assessment:    Diabetes Mellitus type II, under poor control Hypertension - elevated today but was at goal last visit when he saw Nils PyleWilliam [place] Hyperlipidemia - specifically elevated Tg related to uncontrolled DM Anxiety - increasing  Plan:    1.  Rx changes:   Continue Levemir 60 units once daily - can increase by 2 units every 2 days until blood glucose when check in the  morning is 120 or less.   Add Novolog 7 units prior to each meal.  May also use as needed for blood glucose over 200 to 300 = 7 units or if over   Increase Buspar to 15mg  1 tablet up to tid 2.  Education: Reviewed 'ABCs' of diabetes management (respective goals in parentheses):  A1C (<7), blood pressure (<130/80), and cholesterol (LDL <100). 3.  Compliance is poor - discussed need to follow up with providers regularly and to take medications as prescribed. 4.   dietary modifications: limiting high CHO containing foods to recommended serving sizes 5.  regular blood sugar monitoring: three times daily  6. Follow up: 1 month  with myself  Total time spent with patient = 40 minutes.  Henrene Pastorammy Drew Herman, PharmD, CPP, CDE

## 2014-04-17 NOTE — Patient Instructions (Signed)
Continue Levemir 60 units once daily - can increase by 2 units every 2 days until blood glucose when check in the morning is 120 or less.   Add Novolog 7 units prior to each meal.  May also use as needed for blood glucose over 200 to 300 = 7 units or if over 300 = 10 units.   Diabetes and Standards of Medical Care   Diabetes is complicated. You may find that your diabetes team includes a dietitian, nurse, diabetes educator, eye doctor, and more. To help everyone know what is going on and to help you get the care you deserve, the following schedule of care was developed to help keep you on track. Below are the tests, exams, vaccines, medicines, education, and plans you will need.  Blood Glucose Goals Prior to meals = 80 - 130 Within 2 hours of the start of a meal = less than 180  HbA1c test (goal is less than 7.0% - your last value was %) This test shows how well you have controlled your glucose over the past 2 to 3 months. It is used to see if your diabetes management plan needs to be adjusted.   It is performed at least 2 times a year if you are meeting treatment goals.  It is performed 4 times a year if therapy has changed or if you are not meeting treatment goals.  Blood pressure test  This test is performed at every routine medical visit. The goal is less than 140/90 mmHg for most people, but 130/80 mmHg in some cases. Ask your health care provider about your goal.  Dental exam  Follow up with the dentist regularly.  Eye exam  If you are diagnosed with type 1 diabetes as a child, get an exam upon reaching the age of 85 years or older and have had diabetes for 3 to 5 years. Yearly eye exams are recommended after that initial eye exam.  If you are diagnosed with type 1 diabetes as an adult, get an exam within 5 years of diagnosis and then yearly.  If you are diagnosed with type 2 diabetes, get an exam as soon as possible after the diagnosis and then yearly.  Foot care  exam  Visual foot exams are performed at every routine medical visit. The exams check for cuts, injuries, or other problems with the feet.  A comprehensive foot exam should be done yearly. This includes visual inspection as well as assessing foot pulses and testing for loss of sensation.  Check your feet nightly for cuts, injuries, or other problems with your feet. Tell your health care provider if anything is not healing.  Kidney function test (urine microalbumin)  This test is performed once a year.  Type 1 diabetes: The first test is performed 5 years after diagnosis.  Type 2 diabetes: The first test is performed at the time of diagnosis.  A serum creatinine and estimated glomerular filtration rate (eGFR) test is done once a year to assess the level of chronic kidney disease (CKD), if present.  Lipid profile (cholesterol, HDL, LDL, triglycerides)  Performed every 5 years for most people.  The goal for LDL is less than 100 mg/dL. If you are at high risk, the goal is less than 70 mg/dL.  The goal for HDL is 40 mg/dL to 50 mg/dL for men and 50 mg/dL to 60 mg/dL for women. An HDL cholesterol of 60 mg/dL or higher gives some protection against heart disease.  The goal for  triglycerides is less than 150 mg/dL.  Influenza vaccine, pneumococcal vaccine, and hepatitis B vaccine  The influenza vaccine is recommended yearly.  The pneumococcal vaccine is generally given once in a lifetime. However, there are some instances when another vaccination is recommended. Check with your health care provider.  The hepatitis B vaccine is also recommended for adults with diabetes.  Diabetes self-management education  Education is recommended at diagnosis and ongoing as needed.  Treatment plan  Your treatment plan is reviewed at every medical visit.  Document Released: 02/16/2009 Document Revised: 12/22/2012 Document Reviewed: 09/21/2012 [facility] Patient Information 2014 [place].

## 2014-05-07 ENCOUNTER — Other Ambulatory Visit: Payer: Self-pay | Admitting: Family

## 2014-05-19 ENCOUNTER — Ambulatory Visit: Payer: Self-pay

## 2014-07-01 ENCOUNTER — Other Ambulatory Visit: Payer: Self-pay | Admitting: Family

## 2014-07-01 ENCOUNTER — Telehealth: Payer: Self-pay | Admitting: Family Medicine

## 2014-07-01 MED ORDER — INSULIN DETEMIR 100 UNIT/ML FLEXPEN: 130.0000 [IU] | PEN_INJECTOR | Freq: Every day | SUBCUTANEOUS | Status: DC

## 2014-07-01 NOTE — Telephone Encounter (Signed)
Refill sent to pharmacy. Patient needs to follow up with clinical pharmacist about high insulin usage.

## 2014-07-03 NOTE — Telephone Encounter (Signed)
Refilled 1 mos will can make f/u appt w/ TBE in March

## 2014-07-26 ENCOUNTER — Other Ambulatory Visit: Payer: Self-pay | Admitting: *Deleted

## 2014-07-26 MED ORDER — BUSPIRONE HCL 15 MG PO TABS: 15.0000 mg | ORAL_TABLET | Freq: Three times a day (TID) | ORAL | Status: DC

## 2014-07-26 NOTE — Telephone Encounter (Signed)
Last seen 04/11/14  Dr Miller 

## 2014-07-27 ENCOUNTER — Other Ambulatory Visit: Payer: Self-pay | Admitting: Pharmacist

## 2014-08-03 ENCOUNTER — Other Ambulatory Visit: Payer: Self-pay | Admitting: Family Medicine

## 2014-09-14 ENCOUNTER — Other Ambulatory Visit: Payer: Self-pay | Admitting: Family Medicine

## 2014-10-05 ENCOUNTER — Ambulatory Visit: Payer: Self-pay | Admitting: Family Medicine

## 2014-10-05 ENCOUNTER — Encounter: Payer: Self-pay | Admitting: Family Medicine

## 2014-10-11 ENCOUNTER — Other Ambulatory Visit: Payer: Self-pay | Admitting: Family

## 2014-10-12 NOTE — Telephone Encounter (Signed)
Last seen 04/11/14 DR Hyacinth Meeker

## 2014-10-19 ENCOUNTER — Telehealth: Payer: Self-pay | Admitting: Family Medicine

## 2014-10-19 ENCOUNTER — Other Ambulatory Visit: Payer: Self-pay

## 2014-10-19 MED ORDER — INSULIN DETEMIR 100 UNIT/ML FLEXPEN: 130.0000 [IU] | PEN_INJECTOR | Freq: Every day | SUBCUTANEOUS | Status: DC

## 2014-10-20 ENCOUNTER — Ambulatory Visit (INDEPENDENT_AMBULATORY_CARE_PROVIDER_SITE_OTHER): Payer: BC Managed Care – PPO | Admitting: Family Medicine

## 2014-10-20 ENCOUNTER — Encounter: Payer: Self-pay | Admitting: Family Medicine

## 2014-10-20 VITALS — BP 177/85 | HR 86 | Temp 97.3°F | Ht 70.0 in | Wt 231.0 lb

## 2014-10-20 DIAGNOSIS — E1165 Type 2 diabetes mellitus with hyperglycemia: Secondary | ICD-10-CM

## 2014-10-20 DIAGNOSIS — E782 Mixed hyperlipidemia: Secondary | ICD-10-CM

## 2014-10-20 DIAGNOSIS — E1169 Type 2 diabetes mellitus with other specified complication: Secondary | ICD-10-CM

## 2014-10-20 DIAGNOSIS — I10 Essential (primary) hypertension: Secondary | ICD-10-CM

## 2014-10-20 LAB — POCT UA - MICROALBUMIN: MICROALBUMIN (UR) POC: NEGATIVE mg/L

## 2014-10-20 LAB — POCT GLYCOSYLATED HEMOGLOBIN (HGB A1C): Hemoglobin A1C: 8.8

## 2014-10-20 MED ORDER — GABAPENTIN 300 MG PO CAPS: 300.0000 mg | ORAL_CAPSULE | Freq: Three times a day (TID) | ORAL | Status: DC

## 2014-10-20 MED ORDER — ROSUVASTATIN CALCIUM 40 MG PO TABS: 40.0000 mg | ORAL_TABLET | Freq: Every day | ORAL | Status: DC

## 2014-10-20 MED ORDER — BUSPIRONE HCL 15 MG PO TABS: 15.0000 mg | ORAL_TABLET | Freq: Three times a day (TID) | ORAL | Status: DC

## 2014-10-20 MED ORDER — INSULIN ASPART 100 UNIT/ML FLEXPEN: PEN_INJECTOR | SUBCUTANEOUS | Status: DC

## 2014-10-20 MED ORDER — LISINOPRIL 20 MG PO TABS: ORAL_TABLET | ORAL | Status: DC

## 2014-10-20 NOTE — Progress Notes (Signed)
Subjective:    Patient ID: Douglas Carr, male    DOB: [birth year], 49 y.o.   MRN: [id number]  HPI 49 year old gentleman who doesn't come to the doctor very much but has insulin-dependent diabetes, hypertension, and hyperlipidemia, as well as some anxiety. The latter is controlled with BuSpar effectively. He takes both long and short acting insulin. He takes Crestor for lipids but I get the impression he does not take that on a regular basis. Lisinopril is given for blood pressure and renal protection. When he has monitored his pressure in the past is generally been in the 384Y systolic also today it's 659. A new complaint or symptom is pain in his legs and feet that he notices more at nighttime.  Patient Active Problem List   Diagnosis Date Noted  . Hypertension 09/21/2013  . Diabetes mellitus 09/21/2013  . Combined hyperlipidemia associated with type 2 diabetes mellitus 09/21/2013  . Pulmonary nodules 10/16/2011   Outpatient Encounter Prescriptions as of 10/20/2014  Medication Sig  . aspirin 325 MG tablet Take 325 mg by mouth daily.  . busPIRone (BUSPAR) 15 MG tablet Take 1 tablet (15 mg total) by mouth 3 (three) times daily.  Marland Kitchen glucose blood (ONETOUCH VERIO) test strip Test twice daily  . Insulin Detemir (LEVEMIR FLEXTOUCH) 100 UNIT/ML Pen Inject 130 Units into the skin daily at 10 pm. (Patient taking differently: Inject 140 Units into the skin daily at 10 pm. )  . Insulin Pen Needle (RELION PEN NEEDLES) 32G X 4 MM MISC Use to inject insulin once daily  . lisinopril (PRINIVIL,ZESTRIL) 20 MG tablet TAKE 1 TABLET (20 MG TOTAL) BY MOUTH EVERY MORNING.  Marland Kitchen NOVOLOG FLEXPEN 100 UNIT/ML FlexPen INJECT 7 TO 10 UNITS 3 TIMES A DAY PRIOR TO EACH MEAL AND AS NEEDED FOR BG GREATER THAN 200.  Marland Kitchen rosuvastatin (CRESTOR) 40 MG tablet Take 1 tablet (40 mg total) by mouth at bedtime.  . [DISCONTINUED] lisinopril (PRINIVIL,ZESTRIL) 20 MG tablet TAKE 1 TABLET (20 MG TOTAL) BY MOUTH EVERY MORNING.   No  facility-administered encounter medications on file as of 10/20/2014.      Review of Systems  Constitutional: Negative.   Respiratory: Negative.   Cardiovascular: Negative.   Neurological: Negative.   Psychiatric/Behavioral: Negative.        Objective:   Physical Exam  Constitutional: He is oriented to person, place, and time. He appears well-developed and well-nourished.  Cardiovascular: Normal rate, regular rhythm and intact distal pulses.   Pulmonary/Chest: Effort normal and breath sounds normal.  Neurological: He is alert and oriented to person, place, and time.  Psychiatric: He has a normal mood and affect.     BP 177/85 mmHg  Pulse 86  Temp(Src) 97.3 F (36.3 C) (Oral)  Ht _0  (1.778 m)  Wt 231 lb (104.781 kg)  BMI 33.15 kg/m2      Assessment & Plan:  1. Essential hypertension AP elevated today. Have asked patient to follow along and if is consistently above 135/85 would probably need to add another medicine to his lisinopril  2. Type 2 diabetes mellitus with hyperglycemia A1c has improved since last tested. It is still not at goal. I think patient probably needs to take his diabetes little more seriously. He admitted to some dietary indiscretion - POCT glycosylated hemoglobin (Hb A1C) - POCT UA - Microalbumin  3. Combined hyperlipidemia associated with type 2 diabetes mellitus Taking Crestor without side effects. Yearly monitoring today  Wardell Honour MD - CMP14+EGFR - Lipid panel

## 2014-10-21 LAB — CMP14+EGFR
A/G RATIO: 1.8 (ref 1.1–2.5)
ALT: 25 IU/L (ref 0–44)
AST: 12 IU/L (ref 0–40)
Albumin: 4.4 g/dL (ref 3.5–5.5)
Alkaline Phosphatase: 55 IU/L (ref 39–117)
BUN/Creatinine Ratio: 18 (ref 9–20)
BUN: 14 mg/dL (ref 6–24)
CO2: 27 mmol/L (ref 18–29)
Calcium: 9.6 mg/dL (ref 8.7–10.2)
Chloride: 99 mmol/L (ref 97–108)
Creatinine, Ser: 0.77 mg/dL (ref 0.76–1.27)
GFR calc Af Amer: 124 mL/min/{1.73_m2} (ref >59)
GFR, EST NON AFRICAN AMERICAN: 107 mL/min/{1.73_m2} (ref >59)
GLUCOSE: 214 mg/dL — AB (ref 65–99)
Globulin, Total: 2.4 g/dL (ref 1.5–4.5)
POTASSIUM: 4.8 mmol/L (ref 3.5–5.2)
SODIUM: 140 mmol/L (ref 134–144)
TOTAL PROTEIN: 6.8 g/dL (ref 6.0–8.5)

## 2014-10-21 LAB — LIPID PANEL
Chol/HDL Ratio: 9.4 ratio units — ABNORMAL HIGH (ref 0.0–5.0)
Cholesterol, Total: 253 mg/dL — ABNORMAL HIGH (ref 100–199)
HDL: 27 mg/dL — AB (ref >39)
LDL Calculated: 154 mg/dL — ABNORMAL HIGH (ref 0–99)
TRIGLYCERIDES: 362 mg/dL — AB (ref 0–149)
VLDL Cholesterol Cal: 72 mg/dL — ABNORMAL HIGH (ref 5–40)

## 2014-11-08 ENCOUNTER — Other Ambulatory Visit: Payer: Self-pay | Admitting: Pharmacist

## 2014-12-01 ENCOUNTER — Other Ambulatory Visit: Payer: Self-pay | Admitting: *Deleted

## 2014-12-01 MED ORDER — INSULIN DETEMIR 100 UNIT/ML FLEXPEN: 140.0000 [IU] | PEN_INJECTOR | Freq: Every day | SUBCUTANEOUS | Status: DC

## 2014-12-01 NOTE — Telephone Encounter (Signed)
Patient uses 140 units at bedtime. Last time it was ordered for 60 units. Prescription changed and sent into [facility] pharmacy per patient and wife's request.

## 2014-12-20 ENCOUNTER — Ambulatory Visit: Payer: BC Managed Care – PPO | Admitting: Family Medicine

## 2014-12-21 ENCOUNTER — Encounter: Payer: Self-pay | Admitting: Family Medicine

## 2015-02-21 ENCOUNTER — Other Ambulatory Visit: Payer: Self-pay | Admitting: Family Medicine

## 2015-03-07 ENCOUNTER — Telehealth: Payer: Self-pay | Admitting: Family Medicine

## 2015-03-21 ENCOUNTER — Other Ambulatory Visit: Payer: Self-pay | Admitting: Family Medicine

## 2015-04-26 ENCOUNTER — Other Ambulatory Visit: Payer: Self-pay | Admitting: Family Medicine

## 2015-04-26 NOTE — Telephone Encounter (Signed)
Last seen 10/20/14  Dr. Hyacinth MeekerMiller

## 2015-05-17 ENCOUNTER — Other Ambulatory Visit: Payer: Self-pay | Admitting: Pharmacist

## 2015-05-23 ENCOUNTER — Telehealth: Payer: Self-pay | Admitting: Family Medicine

## 2015-06-05 NOTE — Telephone Encounter (Signed)
Will call back.

## 2015-06-21 ENCOUNTER — Other Ambulatory Visit: Payer: Self-pay | Admitting: Family Medicine

## 2015-08-10 ENCOUNTER — Other Ambulatory Visit: Payer: Self-pay | Admitting: Family Medicine

## 2015-08-14 ENCOUNTER — Telehealth: Payer: Self-pay | Admitting: Family Medicine

## 2015-11-14 ENCOUNTER — Encounter: Payer: Self-pay | Admitting: Family Medicine

## 2015-11-14 ENCOUNTER — Ambulatory Visit (INDEPENDENT_AMBULATORY_CARE_PROVIDER_SITE_OTHER): Payer: BC Managed Care – PPO | Admitting: Family Medicine

## 2015-11-14 VITALS — BP 154/80 | HR 84 | Temp 97.2°F | Ht 70.0 in | Wt 226.0 lb

## 2015-11-14 DIAGNOSIS — E1165 Type 2 diabetes mellitus with hyperglycemia: Secondary | ICD-10-CM

## 2015-11-14 DIAGNOSIS — I10 Essential (primary) hypertension: Secondary | ICD-10-CM | POA: Diagnosis not present

## 2015-11-14 DIAGNOSIS — Z794 Long term (current) use of insulin: Secondary | ICD-10-CM

## 2015-11-14 MED ORDER — INSULIN DEGLUDEC 200 UNIT/ML ~~LOC~~ SOPN: 160.0000 [IU] | PEN_INJECTOR | Freq: Every day | SUBCUTANEOUS | Status: DC

## 2015-11-14 MED ORDER — ATORVASTATIN CALCIUM 20 MG PO TABS: ORAL_TABLET | ORAL | Status: DC

## 2015-11-14 MED ORDER — LISINOPRIL 20 MG PO TABS: ORAL_TABLET | ORAL | Status: DC

## 2015-11-14 MED ORDER — INSULIN ASPART 100 UNIT/ML FLEXPEN: PEN_INJECTOR | SUBCUTANEOUS | Status: DC

## 2015-11-14 NOTE — Progress Notes (Signed)
Subjective:    Patient ID: Douglas Carr, male    DOB: [birth year], 50 y.o.   MRN: [id number]  HPI  Patient is here today for a follow up on his hypertension and diabetes. 50 year old who doesn't come in very often but probably should given the fact that he had an heart attack at age 50 and has insulin-dependent diabetes, hypertension, hyperlipidemia. He is out of all medicines now He last took basal insulin, Levemir, 160 units daily as well as NovoLog 10 units in the morning but not other meals. Last A1c was 8.8 one year ago  Review of Systems  Constitutional: Negative.   HENT: Negative.   Eyes: Negative.   Respiratory: Negative.   Cardiovascular: Negative.   Gastrointestinal: Negative.   Endocrine: Negative.   Genitourinary: Negative.   Musculoskeletal: Negative.   Skin: Negative.   Allergic/Immunologic: Negative.   Neurological: Negative.   Hematological: Negative.   Psychiatric/Behavioral: Negative.    [facility] 11/14/2015 10/20/2014 11/14/2013  Decreased Interest 0 0 0  Down, Depressed, Hopeless 0 0 0  PHQ - 2 Score 0 0 0         Patient Active Problem List   Diagnosis Date Noted  . Hypertension 09/21/2013  . Diabetes mellitus (HCC) 09/21/2013  . Combined hyperlipidemia associated with type 2 diabetes mellitus 09/21/2013  . Pulmonary nodules 10/16/2011   Outpatient Encounter Prescriptions as of 11/14/2015  Medication Sig  . aspirin 325 MG tablet Take 325 mg by mouth daily.  . BD PEN NEEDLE NANO U/F 32G X 4 MM MISC USE TO INJECT INSULIN DAILY  . insulin aspart (NOVOLOG FLEXPEN) 100 UNIT/ML FlexPen Inject 7 to 10 units 3 times a day prior to each meal and as needed for blood sugar greater than 200  . lisinopril (PRINIVIL,ZESTRIL) 20 MG tablet TAKE 1 TABLET (20 MG TOTAL) BY MOUTH EVERY MORNING.  Letta Pate. ONETOUCH VERIO test strip TEST TWICE DAILY E11.9  . LEVEMIR FLEXTOUCH 100 UNIT/ML Pen INJECT 140 UNITS INTO THE SKIN DAILY AT 10 PM. (Patient taking  differently: INJECT 160 UNITS INTO THE SKIN DAILY AT 10 PM.)  . [DISCONTINUED] busPIRone (BUSPAR) 15 MG tablet Take 1 tablet (15 mg total) by mouth 3 (three) times daily. (Patient not taking: Reported on 11/14/2015)  . [DISCONTINUED] gabapentin (NEURONTIN) 300 MG capsule Take 1 capsule (300 mg total) by mouth 3 (three) times daily.  . [DISCONTINUED] LEVEMIR FLEXTOUCH 100 UNIT/ML Pen INJECT 140 UNITS INTO THE SKIN DAILY AT 10 PM.  . [DISCONTINUED] rosuvastatin (CRESTOR) 40 MG tablet Take 1 tablet (40 mg total) by mouth at bedtime.   No facility-administered encounter medications on file as of 11/14/2015.        Objective:   Physical Exam  Constitutional: He is oriented to person, place, and time. He appears well-developed and well-nourished.  Cardiovascular: Normal rate and regular rhythm.   Pulmonary/Chest: Effort normal and breath sounds normal.  Neurological: He is alert and oriented to person, place, and time.  Psychiatric: He has a normal mood and affect. His behavior is normal.    BP 154/80 mmHg  Pulse 84  Temp(Src) 97.2 F (36.2 C) (Oral)  Ht 5\' 10"  (1.778 m)  Wt 226 lb (102.513 kg)  BMI 32.43 kg/m2       Assessment & Plan:  1. Essential hypertension Blood pressure is fair today but he has been off lisinopril for a while. Will restart at 20 mg  2. Type 2 diabetes mellitus with hyperglycemia, with long-term current  use of insulin (HCC) Will not check A1c today since he has been off medicines. Blood sugar this morning was 320. I think that he probably does not take diabetes seriously tried to stress importance of good management at today's visit. Will restart insulins with proceed by and NovoLog and recheck in 3 months  [name] MD

## 2015-11-15 ENCOUNTER — Other Ambulatory Visit: Payer: Self-pay | Admitting: Pharmacist

## 2015-11-15 MED ORDER — INSULIN DEGLUDEC 200 UNIT/ML ~~LOC~~ SOPN: 160.0000 [IU] | PEN_INJECTOR | Freq: Every day | SUBCUTANEOUS | Status: DC

## 2015-11-20 ENCOUNTER — Other Ambulatory Visit: Payer: Self-pay | Admitting: Family Medicine

## 2016-02-19 ENCOUNTER — Ambulatory Visit (INDEPENDENT_AMBULATORY_CARE_PROVIDER_SITE_OTHER): Payer: BC Managed Care – PPO | Admitting: Family Medicine

## 2016-02-19 ENCOUNTER — Encounter: Payer: Self-pay | Admitting: Family Medicine

## 2016-02-19 VITALS — BP 150/79 | HR 89 | Temp 98.2°F | Ht 70.0 in | Wt 243.0 lb

## 2016-02-19 DIAGNOSIS — I10 Essential (primary) hypertension: Secondary | ICD-10-CM | POA: Diagnosis not present

## 2016-02-19 DIAGNOSIS — Z23 Encounter for immunization: Secondary | ICD-10-CM

## 2016-02-19 DIAGNOSIS — E782 Mixed hyperlipidemia: Secondary | ICD-10-CM | POA: Diagnosis not present

## 2016-02-19 DIAGNOSIS — E1169 Type 2 diabetes mellitus with other specified complication: Secondary | ICD-10-CM

## 2016-02-19 DIAGNOSIS — Z794 Long term (current) use of insulin: Secondary | ICD-10-CM | POA: Diagnosis not present

## 2016-02-19 DIAGNOSIS — E1165 Type 2 diabetes mellitus with hyperglycemia: Secondary | ICD-10-CM | POA: Diagnosis not present

## 2016-02-19 DIAGNOSIS — E785 Hyperlipidemia, unspecified: Secondary | ICD-10-CM

## 2016-02-19 LAB — BAYER DCA HB A1C WAIVED: HB A1C: 8.3 % — AB (ref <7.0)

## 2016-02-19 MED ORDER — LOSARTAN POTASSIUM-HCTZ 100-25 MG PO TABS: 1.0000 | ORAL_TABLET | Freq: Every day | ORAL | 3 refills | Status: DC

## 2016-02-19 NOTE — Progress Notes (Signed)
   Subjective:    Patient ID: Douglas Carr, male    DOB: 05-08-1965, 50 y.o.   MRN: [id number]  HPI Pt here for follow up and management of chronic medical problems which are diabetes, hyperlipidemia, and hypertension.  Patient has gotten back on his insulin and sugars are generally 120 over lower in the mornings on fasting specimen. He has gained significant weight since last visit here in July going from 226 pounds to 243 pounds. He denies any symptoms related to neuropathy. Blood pressure is a little elevated today at 150/79 on lisinopril. He does also complain of some dry cough  Patient Active Problem List   Diagnosis Date Noted  . Hypertension 09/21/2013  . Diabetes mellitus ([place]) 09/21/2013  . Combined hyperlipidemia associated with type 2 diabetes mellitus 09/21/2013  . Pulmonary nodules 10/16/2011   Outpatient Encounter Prescriptions as of 02/19/2016  Medication Sig  . aspirin 325 MG tablet Take 325 mg by mouth daily.  Marland Kitchen atorvastatin (LIPITOR) 20 MG tablet Take 1 tablet every other day  . B-D ULTRAFINE III SHORT PEN 31G X 8 MM MISC USE TO INJECT INSULIN DAILY  . insulin aspart (NOVOLOG FLEXPEN) 100 UNIT/ML FlexPen Inject 6 to 10 units 3 times a day prior to each meal and as needed for blood sugar greater than 200  . Insulin Degludec (TRESIBA FLEXTOUCH) 200 UNIT/ML SOPN Inject 160 Units into the skin daily.  Marland Kitchen lisinopril (PRINIVIL,ZESTRIL) 20 MG tablet TAKE 1 TABLET (20 MG TOTAL) BY MOUTH EVERY MORNING.  Glory Rosebush VERIO test strip TEST TWICE DAILY   No facility-administered encounter medications on file as of 02/19/2016.       Review of Systems  Constitutional: Negative.   HENT: Negative.   Eyes: Negative.   Respiratory: Negative.   Cardiovascular: Negative.   Gastrointestinal: Negative.   Endocrine: Negative.   Genitourinary: Negative.   Musculoskeletal: Negative.   Skin: Negative.   Allergic/Immunologic: Negative.   Neurological: Negative.   Hematological:  Negative.   Psychiatric/Behavioral: Negative.        Objective:   Physical Exam  Constitutional: He is oriented to person, place, and time. He appears well-developed and well-nourished.  Cardiovascular: Normal rate and regular rhythm.   Pulmonary/Chest: Effort normal and breath sounds normal.  Neurological: He is alert and oriented to person, place, and time.  Psychiatric: He has a normal mood and affect. His behavior is normal.     BP (!) 150/79 (BP Location: Left Arm)   Pulse 89   Temp 98.2 F (36.8 C) (Oral)   Ht '5\' 10"'$  (1.778 m)   Wt 243 lb (110.2 kg)   BMI 34.87 kg/m       Assessment & Plan:  1. Type 2 diabetes mellitus with hyperglycemia, with long-term current use of insulin (HCC) Last A1c was 8.8 that was over a year ago. Would expect it to be lower now. Is not likely that insulin is causing his weight gain since it should be weight neutral. Stressed importance of watching diet especially car - Bayer DCA Hb A1c Waived  2. Essential hypertension Discontinue lisinopril and begin losartan 100/25 - CMP14+EGFR  3. Hyperlipidemia associated with type 2 diabetes mellitus ([place]) Last risk assessment was 12%. Depending on lipid levels today may need to increase statin and an effort to get the risk below 7-1/2% - Lipid panel  Wardell Honour MD  4. Encounter for immunization  - Flu Vaccine QUAD 36+ mos IM

## 2016-02-19 NOTE — Patient Instructions (Signed)
Continue current medications. Continue good therapeutic lifestyle changes which include good diet and exercise. Fall precautions discussed with patient. If an FOBT was given today- please return it to our front desk. If you are over 50 years old - you may need Prevnar 13 or the adult Pneumonia vaccine.  **Flu shots are available--- please call and schedule a FLU-CLINIC appointment**  After your visit with us today you will receive a survey in the mail or online from [employer] regarding your care with us. Please take a moment to fill this out. Your feedback is very important to us as you can help us better understand your patient needs as well as improve your experience and satisfaction. WE CARE ABOUT YOU!!!    

## 2016-05-06 ENCOUNTER — Encounter (HOSPITAL_COMMUNITY): Payer: Self-pay | Admitting: Emergency Medicine

## 2016-05-06 DIAGNOSIS — Z6836 Body mass index (BMI) 36.0-36.9, adult: Secondary | ICD-10-CM

## 2016-05-06 DIAGNOSIS — E1151 Type 2 diabetes mellitus with diabetic peripheral angiopathy without gangrene: Secondary | ICD-10-CM | POA: Diagnosis present

## 2016-05-06 DIAGNOSIS — G4730 Sleep apnea, unspecified: Secondary | ICD-10-CM | POA: Diagnosis present

## 2016-05-06 DIAGNOSIS — I2582 Chronic total occlusion of coronary artery: Secondary | ICD-10-CM | POA: Diagnosis present

## 2016-05-06 DIAGNOSIS — I5023 Acute on chronic systolic (congestive) heart failure: Secondary | ICD-10-CM | POA: Diagnosis present

## 2016-05-06 DIAGNOSIS — E876 Hypokalemia: Secondary | ICD-10-CM | POA: Diagnosis not present

## 2016-05-06 DIAGNOSIS — I2129 ST elevation (STEMI) myocardial infarction involving other sites: Principal | ICD-10-CM | POA: Diagnosis present

## 2016-05-06 DIAGNOSIS — Z87891 Personal history of nicotine dependence: Secondary | ICD-10-CM

## 2016-05-06 DIAGNOSIS — R079 Chest pain, unspecified: Secondary | ICD-10-CM | POA: Diagnosis not present

## 2016-05-06 DIAGNOSIS — Z79899 Other long term (current) drug therapy: Secondary | ICD-10-CM

## 2016-05-06 DIAGNOSIS — Z794 Long term (current) use of insulin: Secondary | ICD-10-CM

## 2016-05-06 DIAGNOSIS — Z7982 Long term (current) use of aspirin: Secondary | ICD-10-CM

## 2016-05-06 DIAGNOSIS — E1165 Type 2 diabetes mellitus with hyperglycemia: Secondary | ICD-10-CM | POA: Diagnosis present

## 2016-05-06 DIAGNOSIS — E782 Mixed hyperlipidemia: Secondary | ICD-10-CM | POA: Diagnosis present

## 2016-05-06 DIAGNOSIS — I252 Old myocardial infarction: Secondary | ICD-10-CM

## 2016-05-06 DIAGNOSIS — I2510 Atherosclerotic heart disease of native coronary artery without angina pectoris: Secondary | ICD-10-CM | POA: Diagnosis present

## 2016-05-06 DIAGNOSIS — I110 Hypertensive heart disease with heart failure: Secondary | ICD-10-CM | POA: Diagnosis present

## 2016-05-06 DIAGNOSIS — Z9119 Patient's noncompliance with other medical treatment and regimen: Secondary | ICD-10-CM

## 2016-05-06 DIAGNOSIS — E669 Obesity, unspecified: Secondary | ICD-10-CM | POA: Diagnosis present

## 2016-05-06 DIAGNOSIS — Z8249 Family history of ischemic heart disease and other diseases of the circulatory system: Secondary | ICD-10-CM

## 2016-05-06 NOTE — ED Triage Notes (Signed)
Patient reports intermittent central chest pain with SOB and dry cough onset 2 days ago , denies nausea or diaphoresis .

## 2016-05-06 NOTE — ED Notes (Signed)
Patient stated his CP and SOB has been going on for 2 days.  NAD noted while getting registered

## 2016-05-07 ENCOUNTER — Encounter (HOSPITAL_COMMUNITY)
Admission: EM | Disposition: A | Discharge status: Home / Self Care | Payer: Self-pay | Source: Home / Self Care | Attending: Cardiology

## 2016-05-07 ENCOUNTER — Inpatient Hospital Stay (HOSPITAL_COMMUNITY)
Admission: EM | Admit: 2016-05-07 | Discharge: 2016-05-11 | DRG: 246 | Disposition: A | Discharge status: Home / Self Care | Payer: BC Managed Care – PPO | Attending: Cardiology | Admitting: Cardiology

## 2016-05-07 ENCOUNTER — Encounter (HOSPITAL_COMMUNITY): Payer: Self-pay | Admitting: Cardiology

## 2016-05-07 ENCOUNTER — Emergency Department (HOSPITAL_COMMUNITY): Payer: BC Managed Care – PPO

## 2016-05-07 DIAGNOSIS — E119 Type 2 diabetes mellitus without complications: Secondary | ICD-10-CM

## 2016-05-07 DIAGNOSIS — E669 Obesity, unspecified: Secondary | ICD-10-CM | POA: Diagnosis present

## 2016-05-07 DIAGNOSIS — I213 ST elevation (STEMI) myocardial infarction of unspecified site: Secondary | ICD-10-CM

## 2016-05-07 DIAGNOSIS — Z7982 Long term (current) use of aspirin: Secondary | ICD-10-CM | POA: Diagnosis not present

## 2016-05-07 DIAGNOSIS — E1151 Type 2 diabetes mellitus with diabetic peripheral angiopathy without gangrene: Secondary | ICD-10-CM | POA: Diagnosis present

## 2016-05-07 DIAGNOSIS — Z6836 Body mass index (BMI) 36.0-36.9, adult: Secondary | ICD-10-CM | POA: Diagnosis not present

## 2016-05-07 DIAGNOSIS — I10 Essential (primary) hypertension: Secondary | ICD-10-CM | POA: Diagnosis not present

## 2016-05-07 DIAGNOSIS — I2121 ST elevation (STEMI) myocardial infarction involving left circumflex coronary artery: Secondary | ICD-10-CM | POA: Diagnosis not present

## 2016-05-07 DIAGNOSIS — Z955 Presence of coronary angioplasty implant and graft: Secondary | ICD-10-CM

## 2016-05-07 DIAGNOSIS — E1159 Type 2 diabetes mellitus with other circulatory complications: Secondary | ICD-10-CM | POA: Diagnosis not present

## 2016-05-07 DIAGNOSIS — Z9119 Patient's noncompliance with other medical treatment and regimen: Secondary | ICD-10-CM | POA: Diagnosis not present

## 2016-05-07 DIAGNOSIS — E1165 Type 2 diabetes mellitus with hyperglycemia: Secondary | ICD-10-CM | POA: Diagnosis present

## 2016-05-07 DIAGNOSIS — Z794 Long term (current) use of insulin: Secondary | ICD-10-CM | POA: Diagnosis not present

## 2016-05-07 DIAGNOSIS — E782 Mixed hyperlipidemia: Secondary | ICD-10-CM | POA: Diagnosis present

## 2016-05-07 DIAGNOSIS — Z87891 Personal history of nicotine dependence: Secondary | ICD-10-CM | POA: Diagnosis not present

## 2016-05-07 DIAGNOSIS — E876 Hypokalemia: Secondary | ICD-10-CM | POA: Diagnosis not present

## 2016-05-07 DIAGNOSIS — I110 Hypertensive heart disease with heart failure: Secondary | ICD-10-CM | POA: Diagnosis present

## 2016-05-07 DIAGNOSIS — Z79899 Other long term (current) drug therapy: Secondary | ICD-10-CM | POA: Diagnosis not present

## 2016-05-07 DIAGNOSIS — E1169 Type 2 diabetes mellitus with other specified complication: Secondary | ICD-10-CM | POA: Diagnosis not present

## 2016-05-07 DIAGNOSIS — I5023 Acute on chronic systolic (congestive) heart failure: Secondary | ICD-10-CM

## 2016-05-07 DIAGNOSIS — I2510 Atherosclerotic heart disease of native coronary artery without angina pectoris: Secondary | ICD-10-CM | POA: Diagnosis present

## 2016-05-07 DIAGNOSIS — I2582 Chronic total occlusion of coronary artery: Secondary | ICD-10-CM | POA: Diagnosis present

## 2016-05-07 DIAGNOSIS — R079 Chest pain, unspecified: Secondary | ICD-10-CM | POA: Diagnosis present

## 2016-05-07 DIAGNOSIS — I2129 ST elevation (STEMI) myocardial infarction involving other sites: Secondary | ICD-10-CM | POA: Diagnosis present

## 2016-05-07 DIAGNOSIS — I252 Old myocardial infarction: Secondary | ICD-10-CM | POA: Diagnosis not present

## 2016-05-07 DIAGNOSIS — Z8249 Family history of ischemic heart disease and other diseases of the circulatory system: Secondary | ICD-10-CM | POA: Diagnosis not present

## 2016-05-07 DIAGNOSIS — I429 Cardiomyopathy, unspecified: Secondary | ICD-10-CM | POA: Diagnosis not present

## 2016-05-07 DIAGNOSIS — G4730 Sleep apnea, unspecified: Secondary | ICD-10-CM | POA: Diagnosis present

## 2016-05-07 HISTORY — PX: CARDIAC CATHETERIZATION: SHX172

## 2016-05-07 HISTORY — DX: ST elevation (STEMI) myocardial infarction involving left circumflex coronary artery: I21.21

## 2016-05-07 LAB — COMPREHENSIVE METABOLIC PANEL
ALBUMIN: 3.2 g/dL — AB (ref 3.5–5.0)
ALK PHOS: 48 U/L (ref 38–126)
ALT: 43 U/L (ref 17–63)
ALT: 44 U/L (ref 17–63)
ANION GAP: 13 (ref 5–15)
AST: 74 U/L — ABNORMAL HIGH (ref 15–41)
AST: 69 U/L — ABNORMAL HIGH (ref 15–41)
Albumin: 3.3 g/dL — ABNORMAL LOW (ref 3.5–5.0)
Alkaline Phosphatase: 42 U/L (ref 38–126)
Anion gap: 11 (ref 5–15)
BILIRUBIN TOTAL: 0.5 mg/dL (ref 0.3–1.2)
BUN: 29 mg/dL — ABNORMAL HIGH (ref 6–20)
BUN: 28 mg/dL — AB (ref 6–20)
CALCIUM: 8.9 mg/dL (ref 8.9–10.3)
CHLORIDE: 103 mmol/L (ref 101–111)
CO2: 23 mmol/L (ref 22–32)
CO2: 21 mmol/L — ABNORMAL LOW (ref 22–32)
CREATININE: 1.29 mg/dL — AB (ref 0.61–1.24)
Calcium: 8.7 mg/dL — ABNORMAL LOW (ref 8.9–10.3)
Chloride: 100 mmol/L — ABNORMAL LOW (ref 101–111)
Creatinine, Ser: 1.17 mg/dL (ref 0.61–1.24)
GFR calc Af Amer: >60 mL/min (ref >60)
GFR calc non Af Amer: >60 mL/min (ref >60)
GFR calc non Af Amer: >60 mL/min (ref >60)
GLUCOSE: 287 mg/dL — AB (ref 65–99)
GLUCOSE: 359 mg/dL — AB (ref 65–99)
Potassium: 4.5 mmol/L (ref 3.5–5.1)
Potassium: 4.2 mmol/L (ref 3.5–5.1)
Sodium: 136 mmol/L (ref 135–145)
Sodium: 135 mmol/L (ref 135–145)
TOTAL PROTEIN: 6.8 g/dL (ref 6.5–8.1)
Total Bilirubin: 0.7 mg/dL (ref 0.3–1.2)
Total Protein: 6.6 g/dL (ref 6.5–8.1)

## 2016-05-07 LAB — LIPID PANEL
CHOL/HDL RATIO: 6.6 ratio
Cholesterol: 244 mg/dL — ABNORMAL HIGH (ref 0–200)
Cholesterol: 245 mg/dL — ABNORMAL HIGH (ref 0–200)
HDL: 37 mg/dL — ABNORMAL LOW (ref >40)
HDL: 37 mg/dL — AB (ref >40)
LDL CALC: 170 mg/dL — AB (ref 0–99)
LDL Cholesterol: 171 mg/dL — ABNORMAL HIGH (ref 0–99)
TRIGLYCERIDES: 183 mg/dL — AB (ref <150)
TRIGLYCERIDES: 185 mg/dL — AB (ref <150)
Total CHOL/HDL Ratio: 6.6 RATIO
VLDL: 37 mg/dL (ref 0–40)
VLDL: 37 mg/dL (ref 0–40)

## 2016-05-07 LAB — TROPONIN I
TROPONIN I: 14.16 ng/mL — AB (ref <0.03)
TROPONIN I: 11.53 ng/mL — AB (ref <0.03)
Troponin I: 10.31 ng/mL (ref <0.03)
Troponin I: 12.53 ng/mL (ref <0.03)
Troponin I: 11.9 ng/mL (ref <0.03)

## 2016-05-07 LAB — DIFFERENTIAL
Basophils Absolute: 0 10*3/uL (ref 0.0–0.1)
Basophils Relative: 0 %
EOS PCT: 0 %
Eosinophils Absolute: 0 10*3/uL (ref 0.0–0.7)
Lymphocytes Relative: 23 %
Lymphs Abs: 3.1 10*3/uL (ref 0.7–4.0)
MONO ABS: 1.6 10*3/uL — AB (ref 0.1–1.0)
Monocytes Relative: 12 %
Neutro Abs: 9 10*3/uL — ABNORMAL HIGH (ref 1.7–7.7)
Neutrophils Relative %: 65 %

## 2016-05-07 LAB — APTT: aPTT: 28 seconds (ref 24–36)

## 2016-05-07 LAB — GLUCOSE, CAPILLARY
Glucose-Capillary: 294 mg/dL — ABNORMAL HIGH (ref 65–99)
Glucose-Capillary: 284 mg/dL — ABNORMAL HIGH (ref 65–99)
Glucose-Capillary: 230 mg/dL — ABNORMAL HIGH (ref 65–99)
Glucose-Capillary: 186 mg/dL — ABNORMAL HIGH (ref 65–99)

## 2016-05-07 LAB — CBC
HCT: 42 % (ref 39.0–52.0)
HCT: 42.1 % (ref 39.0–52.0)
Hemoglobin: 14.3 g/dL (ref 13.0–17.0)
Hemoglobin: 14.6 g/dL (ref 13.0–17.0)
MCH: 28.1 pg (ref 26.0–34.0)
MCH: 28.9 pg (ref 26.0–34.0)
MCHC: 34 g/dL (ref 30.0–36.0)
MCHC: 34.7 g/dL (ref 30.0–36.0)
MCV: 82.5 fL (ref 78.0–100.0)
MCV: 83.2 fL (ref 78.0–100.0)
PLATELETS: 157 10*3/uL (ref 150–400)
Platelets: 156 10*3/uL (ref 150–400)
RBC: 5.06 MIL/uL (ref 4.22–5.81)
RBC: 5.09 MIL/uL (ref 4.22–5.81)
RDW: 12.9 % (ref 11.5–15.5)
RDW: 13 % (ref 11.5–15.5)
WBC: 13.7 10*3/uL — ABNORMAL HIGH (ref 4.0–10.5)
WBC: 14.3 10*3/uL — AB (ref 4.0–10.5)

## 2016-05-07 LAB — PROTIME-INR
INR: 1.07
PROTHROMBIN TIME: 14 s (ref 11.4–15.2)

## 2016-05-07 LAB — I-STAT TROPONIN, ED: TROPONIN I, POC: 11.52 ng/mL — AB (ref 0.00–0.08)

## 2016-05-07 LAB — MRSA PCR SCREENING: MRSA BY PCR: NEGATIVE

## 2016-05-07 LAB — MAGNESIUM: Magnesium: 2.3 mg/dL (ref 1.7–2.4)

## 2016-05-07 LAB — TSH: TSH: 3.503 u[IU]/mL (ref 0.350–4.500)

## 2016-05-07 LAB — BRAIN NATRIURETIC PEPTIDE: B Natriuretic Peptide: 811.3 pg/mL — ABNORMAL HIGH (ref 0.0–100.0)

## 2016-05-07 SURGERY — LEFT HEART CATH AND CORONARY ANGIOGRAPHY

## 2016-05-07 MED ORDER — FENTANYL CITRATE (PF) 100 MCG/2ML IJ SOLN
INTRAMUSCULAR | Status: DC | PRN
  Administered 2016-05-07: 25 ug via INTRAVENOUS

## 2016-05-07 MED ORDER — FUROSEMIDE 10 MG/ML IJ SOLN
40.0000 mg | Freq: Two times a day (BID) | INTRAMUSCULAR | Status: DC
  Administered 2016-05-07 – 2016-05-08 (×3): 40 mg via INTRAVENOUS
  Filled 2016-05-07 (×3): qty 4

## 2016-05-07 MED ORDER — SODIUM CHLORIDE 0.9% FLUSH: 3.0000 mL | INTRAVENOUS | Status: DC | PRN

## 2016-05-07 MED ORDER — INSULIN ASPART 100 UNIT/ML ~~LOC~~ SOLN
0.0000 [IU] | Freq: Three times a day (TID) | SUBCUTANEOUS | Status: DC
  Administered 2016-05-07: 11 [IU] via SUBCUTANEOUS
  Administered 2016-05-07: 7 [IU] via SUBCUTANEOUS
  Administered 2016-05-07: 11 [IU] via SUBCUTANEOUS
  Administered 2016-05-08: 3 [IU] via SUBCUTANEOUS
  Administered 2016-05-08: 4 [IU] via SUBCUTANEOUS
  Administered 2016-05-08 – 2016-05-09 (×2): 11 [IU] via SUBCUTANEOUS
  Administered 2016-05-10: 15 [IU] via SUBCUTANEOUS
  Administered 2016-05-10 – 2016-05-11 (×2): 3 [IU] via SUBCUTANEOUS
  Administered 2016-05-11: 4 [IU] via SUBCUTANEOUS

## 2016-05-07 MED ORDER — SODIUM CHLORIDE 0.9 % IV SOLN
INTRAVENOUS | Status: DC | PRN
  Administered 2016-05-07: 1.75 mg/kg/h via INTRAVENOUS

## 2016-05-07 MED ORDER — GUAIFENESIN-DM 100-10 MG/5ML PO SYRP
5.0000 mL | ORAL_SOLUTION | ORAL | Status: DC | PRN
Start: 2016-05-07 — End: 2016-05-11
  Administered 2016-05-07 – 2016-05-11 (×5): 5 mL via ORAL
  Filled 2016-05-07 (×5): qty 5

## 2016-05-07 MED ORDER — IOPAMIDOL (ISOVUE-370) INJECTION 76%
INTRAVENOUS | Status: DC | PRN
  Administered 2016-05-07: 155 mL via INTRA_ARTERIAL

## 2016-05-07 MED ORDER — VERAPAMIL HCL 2.5 MG/ML IV SOLN
INTRAVENOUS | Status: AC
  Filled 2016-05-07: qty 2

## 2016-05-07 MED ORDER — NITROGLYCERIN 0.4 MG SL SUBL: 0.4000 mg | SUBLINGUAL_TABLET | SUBLINGUAL | Status: DC | PRN

## 2016-05-07 MED ORDER — ATROPINE SULFATE 1 MG/10ML IJ SOSY
PREFILLED_SYRINGE | INTRAMUSCULAR | Status: AC
  Filled 2016-05-07: qty 10

## 2016-05-07 MED ORDER — NITROGLYCERIN 0.4 MG SL SUBL
0.4000 mg | SUBLINGUAL_TABLET | SUBLINGUAL | Status: DC | PRN
  Administered 2016-05-07 (×2): 0.4 mg via SUBLINGUAL

## 2016-05-07 MED ORDER — SODIUM CHLORIDE 0.9% FLUSH
3.0000 mL | Freq: Two times a day (BID) | INTRAVENOUS | Status: DC
Start: 2016-05-07 — End: 2016-05-11
  Administered 2016-05-07 – 2016-05-11 (×8): 3 mL via INTRAVENOUS

## 2016-05-07 MED ORDER — CARVEDILOL 3.125 MG PO TABS
3.1250 mg | ORAL_TABLET | Freq: Two times a day (BID) | ORAL | Status: DC
  Administered 2016-05-07 – 2016-05-09 (×6): 3.125 mg via ORAL
  Filled 2016-05-07 (×6): qty 1

## 2016-05-07 MED ORDER — INSULIN GLARGINE 100 UNIT/ML ~~LOC~~ SOLN
80.0000 [IU] | Freq: Every day | SUBCUTANEOUS | Status: DC
  Administered 2016-05-07 – 2016-05-11 (×5): 80 [IU] via SUBCUTANEOUS
  Filled 2016-05-07 (×5): qty 0.8

## 2016-05-07 MED ORDER — ONDANSETRON HCL 4 MG/2ML IJ SOLN: 4.0000 mg | Freq: Four times a day (QID) | INTRAMUSCULAR | Status: DC | PRN

## 2016-05-07 MED ORDER — TICAGRELOR 90 MG PO TABS
ORAL_TABLET | ORAL | Status: AC
  Filled 2016-05-07: qty 2

## 2016-05-07 MED ORDER — LOSARTAN POTASSIUM 50 MG PO TABS
50.0000 mg | ORAL_TABLET | Freq: Every day | ORAL | Status: DC
  Administered 2016-05-07 – 2016-05-09 (×3): 50 mg via ORAL
  Filled 2016-05-07 (×3): qty 1

## 2016-05-07 MED ORDER — TICAGRELOR 90 MG PO TABS
ORAL_TABLET | ORAL | Status: DC | PRN
  Administered 2016-05-07: 180 mg via ORAL

## 2016-05-07 MED ORDER — MIDAZOLAM HCL 2 MG/2ML IJ SOLN
INTRAMUSCULAR | Status: AC
  Filled 2016-05-07: qty 2

## 2016-05-07 MED ORDER — SODIUM CHLORIDE 0.9% FLUSH: 3.0000 mL | Freq: Two times a day (BID) | INTRAVENOUS | Status: DC

## 2016-05-07 MED ORDER — SODIUM CHLORIDE 0.9 % IV SOLN: 250.0000 mL | INTRAVENOUS | Status: DC | PRN

## 2016-05-07 MED ORDER — IOPAMIDOL (ISOVUE-370) INJECTION 76%
INTRAVENOUS | Status: AC
  Filled 2016-05-07: qty 100

## 2016-05-07 MED ORDER — LIDOCAINE HCL (PF) 1 % IJ SOLN
INTRAMUSCULAR | Status: DC | PRN
  Administered 2016-05-07: 2 mL
  Administered 2016-05-07: 16 mL

## 2016-05-07 MED ORDER — HEPARIN (PORCINE) IN NACL 2-0.9 UNIT/ML-% IJ SOLN
INTRAMUSCULAR | Status: AC
  Filled 2016-05-07: qty 1000

## 2016-05-07 MED ORDER — HEPARIN SODIUM (PORCINE) 5000 UNIT/ML IJ SOLN: 5000.0000 [IU] | Freq: Three times a day (TID) | INTRAMUSCULAR | Status: DC

## 2016-05-07 MED ORDER — ATORVASTATIN CALCIUM 80 MG PO TABS
80.0000 mg | ORAL_TABLET | Freq: Every day | ORAL | Status: DC
  Administered 2016-05-07 – 2016-05-10 (×4): 80 mg via ORAL
  Filled 2016-05-07 (×4): qty 1

## 2016-05-07 MED ORDER — SODIUM CHLORIDE 0.9 % IV SOLN
10.0000 mL/h | INTRAVENOUS | Status: DC
  Administered 2016-05-07: 10 mL/h via INTRAVENOUS

## 2016-05-07 MED ORDER — HEPARIN SODIUM (PORCINE) 5000 UNIT/ML IJ SOLN
5000.0000 [IU] | Freq: Three times a day (TID) | INTRAMUSCULAR | Status: DC
  Administered 2016-05-07 – 2016-05-11 (×10): 5000 [IU] via SUBCUTANEOUS
  Filled 2016-05-07 (×10): qty 1

## 2016-05-07 MED ORDER — BIVALIRUDIN BOLUS VIA INFUSION - CUPID
INTRAVENOUS | Status: DC | PRN
Start: 2016-05-07 — End: 2016-05-07
  Administered 2016-05-07: 83.325 mg via INTRAVENOUS

## 2016-05-07 MED ORDER — FENTANYL CITRATE (PF) 100 MCG/2ML IJ SOLN
INTRAMUSCULAR | Status: AC
  Filled 2016-05-07: qty 2

## 2016-05-07 MED ORDER — ASPIRIN 81 MG PO CHEW
81.0000 mg | CHEWABLE_TABLET | Freq: Every day | ORAL | Status: DC
  Administered 2016-05-07 – 2016-05-11 (×5): 81 mg via ORAL
  Filled 2016-05-07 (×5): qty 1

## 2016-05-07 MED ORDER — NITROGLYCERIN 1 MG/10 ML FOR IR/CATH LAB
INTRA_ARTERIAL | Status: DC | PRN
  Administered 2016-05-07: 200 ug via INTRACORONARY

## 2016-05-07 MED ORDER — MIDAZOLAM HCL 2 MG/2ML IJ SOLN
INTRAMUSCULAR | Status: DC | PRN
  Administered 2016-05-07: 1 mg via INTRAVENOUS

## 2016-05-07 MED ORDER — LIDOCAINE HCL (PF) 1 % IJ SOLN
INTRAMUSCULAR | Status: AC
  Filled 2016-05-07: qty 30

## 2016-05-07 MED ORDER — ACETAMINOPHEN 325 MG PO TABS: 650.0000 mg | ORAL_TABLET | ORAL | Status: DC | PRN

## 2016-05-07 MED ORDER — ASPIRIN 81 MG PO CHEW: 324.0000 mg | CHEWABLE_TABLET | Freq: Once | ORAL | Status: DC

## 2016-05-07 MED ORDER — ASPIRIN 81 MG PO CHEW
324.0000 mg | CHEWABLE_TABLET | Freq: Once | ORAL | Status: AC
  Administered 2016-05-07: 324 mg via ORAL

## 2016-05-07 MED ORDER — NITROGLYCERIN 1 MG/10 ML FOR IR/CATH LAB
INTRA_ARTERIAL | Status: AC
  Filled 2016-05-07: qty 10

## 2016-05-07 MED ORDER — IOPAMIDOL (ISOVUE-370) INJECTION 76%
INTRAVENOUS | Status: AC
  Filled 2016-05-07: qty 125

## 2016-05-07 MED ORDER — HEPARIN (PORCINE) IN NACL 2-0.9 UNIT/ML-% IJ SOLN
INTRAMUSCULAR | Status: DC | PRN
  Administered 2016-05-07: 1000 mL

## 2016-05-07 MED ORDER — INSULIN GLARGINE 100 UNIT/ML ~~LOC~~ SOLN: 160.0000 [IU] | Freq: Every day | SUBCUTANEOUS | Status: DC

## 2016-05-07 MED ORDER — BIVALIRUDIN 250 MG IV SOLR
INTRAVENOUS | Status: AC
  Filled 2016-05-07: qty 250

## 2016-05-07 MED ORDER — ALPRAZOLAM 0.25 MG PO TABS
0.2500 mg | ORAL_TABLET | Freq: Every evening | ORAL | Status: DC | PRN
  Administered 2016-05-07 – 2016-05-10 (×4): 0.25 mg via ORAL
  Filled 2016-05-07 (×4): qty 1

## 2016-05-07 MED ORDER — HEPARIN SODIUM (PORCINE) 5000 UNIT/ML IJ SOLN
4000.0000 [IU] | INTRAMUSCULAR | Status: AC
  Administered 2016-05-07: 4000 [IU] via INTRAVENOUS

## 2016-05-07 MED ORDER — TICAGRELOR 90 MG PO TABS
90.0000 mg | ORAL_TABLET | Freq: Two times a day (BID) | ORAL | Status: DC
  Administered 2016-05-07 – 2016-05-11 (×9): 90 mg via ORAL
  Filled 2016-05-07 (×9): qty 1

## 2016-05-07 MED ORDER — SODIUM CHLORIDE 0.9% FLUSH
3.0000 mL | Freq: Two times a day (BID) | INTRAVENOUS | Status: DC
  Administered 2016-05-07 (×2): 3 mL via INTRAVENOUS

## 2016-05-07 SURGICAL SUPPLY — 17 items
BALLN MOZEC 2.0X12 (BALLOONS) ×2
BALLN ~~LOC~~ MOZEC 2.5X18 (BALLOONS) ×2
BALLOON MOZEC 2.0X12 (BALLOONS) IMPLANT
BALLOON ~~LOC~~ MOZEC 2.5X18 (BALLOONS) IMPLANT
CATH INFINITI 5FR MULTPACK ANG (CATHETERS) ×1 IMPLANT
CATH VISTA GUIDE 6FR JR4 (CATHETERS) ×1 IMPLANT
GUIDE CATH RUNWAY 6FR CLS4 (CATHETERS) ×1 IMPLANT
KIT ENCORE 26 ADVANTAGE (KITS) ×1 IMPLANT
KIT HEART LEFT (KITS) ×2 IMPLANT
PACK CARDIAC CATHETERIZATION (CUSTOM PROCEDURE TRAY) ×2 IMPLANT
SHEATH PINNACLE 6F 10CM (SHEATH) ×1 IMPLANT
STENT PROMUS PREM MR 2.5X24 (Permanent Stent) ×1 IMPLANT
SYR MEDRAD MARK V 150ML (SYRINGE) ×2 IMPLANT
TRANSDUCER W/STOPCOCK (MISCELLANEOUS) ×2 IMPLANT
TUBING CIL FLEX 10 FLL-RA (TUBING) ×2 IMPLANT
WIRE ASAHI PROWATER 180CM (WIRE) ×1 IMPLANT
WIRE EMERALD 3MM-J .035X150CM (WIRE) ×1 IMPLANT

## 2016-05-07 NOTE — Care Management Note (Addendum)
Case Management Note  Patient Details  Name: Margarita SermonsClifford M Tamblyn MRN: [id number] Date of Birth: [birth year]  Subjective/Objective:        Adm w mi            Action/Plan:lives w fam, bcbs ins   Expected Discharge Date:                  Expected Discharge Plan:  Home/Self Care  In-House Referral:     Discharge planning Services  CM Consult, Medication Assistance  Post Acute Care Choice:    Choice offered to:     DME Arranged:    DME Agency:     HH Arranged:    HH Agency:     Status of Service:  Completed, signed off  If discussed at [employer] Length of Stay Meetings, dates discussed:    Additional Comments: gave pt 30day free brilinta card and copay card.S/W JONATHON @ [phone]   BRILINTA 90 MG BID 30 /60 TAB   COVER- YES  CO-PAY- $ 30.00  TIER- 2 DRUG  PRIOR APPROVAL- NO  PHARMACY : [facility]  Hanley HaysDowell, Kaira Stringfield T, RN 05/07/2016, 10:53 AM

## 2016-05-07 NOTE — ED Provider Notes (Signed)
[facility]-EMERGENCY DEPT Provider Note   CSN: [id number] Arrival date & time: 05/06/16  2341  By signing my name below, I, Alyssa Grove, attest that this documentation has been prepared under the direction and in the presence of Shon Baton, MD. Electronically Signed: Alyssa Grove, ED Scribe. 05/07/16. 12:38 AM.   History   Chief Complaint Chief Complaint  Patient presents with  . Chest Pain   LEVEL 5 CAVEAT: HPI and ROS limited due to CODE STEMI  The history is provided by the patient. No language interpreter was used.   HPI Comments: Douglas Carr is a 51 y.o. male with PMHx of CAD, HTN, HLD and MI who presents to the Emergency Department complaining of gradual onset, intermittent 3/10 central chest pain for 2 days. At onset, pt describes pain as "12/10 and crushing" 2-3 days ago. Pt took nitroglycerin with moderate relief to pain. Pain is now 2/10. He reports associated shortness of breath on exertion and non productive cough. Last cardiac catheterization was 5 years ago. He is not currently taking any blood thinners. Denies recent travel. He denies nausea, diaphoresis, blood in stool, leg swelling. NKDA.  Last catherization 2012 with diffuse disease.  Past Medical History:  Diagnosis Date  . Anginal pain (HCC)    secondary to sm. vessel disease  . Anxiety   . Arthritis    BIL KNEE PAIN AND BIL ANKLE PAIN  . Bone spur of ankle   . Coronary artery disease    Remote MI at 51 years of age, Last cath 2010-diffuse non-obstructive disease; last echo 06/16/08 -normal LV function, moderate concentric hypertrophy; Last nuc 08/2008 no ischemia;  medical therapy  . Diabetes mellitus    ON ORAL MEDICATION AND INSULIN  . Hyperlipidemia   . Hypertension   . Myocardial infarction 1996   Post MI  . Peripheral vascular disease (HCC)    HAS LEFT CAROTID ARTERY STENOSIS   AND IS S/P RIGHT CAROTID ENDARTERECTOMY 2010 Last carotid dopplers 01/08/2012 wth patent endarterectomy site     Patient Active Problem List   Diagnosis Date Noted  . Hypertension 09/21/2013  . Diabetes mellitus (HCC) 09/21/2013  . Combined hyperlipidemia associated with type 2 diabetes mellitus 09/21/2013  . Pulmonary nodules 10/16/2011    Past Surgical History:  Procedure Laterality Date  . APPENDECTOMY    . CARDIAC CATHETERIZATION     FEB 2010, significant branch vessel disease wth diag, marginal, PDA & PLA, nml. LV function  . CAROTID ENDARTERECTOMY  09/2008   Rt CEA  . LUNG BIOPSY  2013   Bx's suggest granulomatous dz.  . RT ANKLE   2013    Home Medications    Prior to Admission medications   Medication Sig Start Date End Date Taking? Authorizing Provider  aspirin 325 MG tablet Take 325 mg by mouth daily.    Historical Provider, MD  atorvastatin (LIPITOR) 20 MG tablet Take 1 tablet every other day 11/14/15   [name], MD  B-D ULTRAFINE III SHORT PEN 31G X 8 MM MISC USE TO INJECT INSULIN DAILY 11/21/15   [name], MD  insulin aspart (NOVOLOG FLEXPEN) 100 UNIT/ML FlexPen Inject 6 to 10 units 3 times a day prior to each meal and as needed for blood sugar greater than 200 11/14/15   [name], MD  Insulin Degludec (TRESIBA FLEXTOUCH) 200 UNIT/ML SOPN Inject 160 Units into the skin daily. 11/15/15   [name], MD  losartan-hydrochlorothiazide (HYZAAR) 100-25 MG tablet Take 1  tablet by mouth daily. 02/19/16   [name], MD  [facility] VERIO test strip TEST TWICE DAILY 11/21/15   [name], MD    Family History Family History  Problem Relation Age of Onset  . Hypertension Mother   . Diabetes Mother   . Hypertension Maternal Grandfather   . Heart attack Paternal Grandfather     Social History Social History  Substance Use Topics  . Smoking status: Former Smoker    Packs/day: 2.00    Years: 22.00    Types: Cigarettes    Quit date: 10/23/2011  . Smokeless tobacco: Never Used  . Alcohol use 0.0 oz/week    2 - 3 Cans of beer per week      Comment: occasional     Allergies   Patient has no known allergies.   Review of Systems Review of Systems  Unable to perform ROS: Acuity of condition  Constitutional: Negative for diaphoresis.  Respiratory: Positive for cough and shortness of breath.   Cardiovascular: Positive for chest pain. Negative for leg swelling.  Gastrointestinal: Negative for blood in stool and nausea.  All other systems reviewed and are negative.   Physical Exam Updated Vital Signs BP (!) 141/101 (BP Location: Right Arm)   Pulse 118   Temp 98.4 F (36.9 C) (Oral)   Resp 18   Ht 5\' 9"  (1.753 m)   Wt 245 lb (111.1 kg)   SpO2 98%   BMI 36.18 kg/m   Physical Exam  Constitutional: He is oriented to person, place, and time. He appears well-developed and well-nourished. No distress.  Overweight  HENT:  Head: Normocephalic and atraumatic.  Cardiovascular: Normal rate, regular rhythm, normal heart sounds and intact distal pulses.   No murmur heard. Pulmonary/Chest: Effort normal and breath sounds normal. No respiratory distress. He has no wheezes.  Abdominal: Soft. There is no tenderness.  Musculoskeletal:  Trace lower extremity edema  Neurological: He is alert and oriented to person, place, and time.  Skin: Skin is warm and dry.  Psychiatric: He has a normal mood and affect.  Nursing note and vitals reviewed.    ED Treatments / Results  DIAGNOSTIC STUDIES: Oxygen Saturation is 98% on RA, normal by my interpretation.    COORDINATION OF CARE: 12:28 AM Discussed treatment plan with pt at bedside which includes Nitroglycerin and Aspirin and pt agreed to plan.  Labs (all labs ordered are listed, but only abnormal results are displayed) Labs Reviewed  I-STAT TROPOININ, ED - Abnormal; Notable for the following:       Result Value   Troponin i, poc 11.52 (*)    All other components within normal limits  CBC  DIFFERENTIAL  PROTIME-INR  APTT  COMPREHENSIVE METABOLIC PANEL  TROPONIN I  LIPID  PANEL    EKG  EKG Interpretation  Date/Time:  Wednesday May 07 2016 00:07:33 EST Ventricular Rate:  116 PR Interval:  164 QRS Duration: 92 QT Interval:  310 QTC Calculation: 430 R Axis:   18 Text Interpretation:  Sinus tachycardia Anteroseptal infarct , age undetermined Marked ST abnormality, possible inferolateral subendocardial injury Abnormal ECG Markedly abnormal when compared to prior Concerning for ischemia; STEMI Reconfirmed by HORTON  MD, COURTNEY ([id number]) on 05/07/2016 12:38:02 AM       Radiology Dg Chest 2 View  Result Date: 05/07/2016 CLINICAL DATA:  Chest pain and dyspnea for 2 days. EXAM: CHEST  2 VIEW COMPARISON:  01/06/2012, 04/12/2012. FINDINGS: Calcified granulomatous changes are again evident, stable. There is diffuse  interstitial thickening which may represent interstitial edema. This is new. No confluent airspace consolidation. No large effusions. Hilar and mediastinal contours are unremarkable and unchanged. IMPRESSION: New interstitial thickening. This may represent interstitial edema although a component of fibrosis is not excluded. Electronically Signed   By: Ellery Plunkaniel R Mitchell M.D.   On: 05/07/2016 00:24    Procedures Procedures (including critical care time)  CRITICAL CARE Performed by: Shon BatonHORTON, COURTNEY F   Total critical care time: 25 minutes  Critical care time was exclusive of separately billable procedures and treating other patients.  Critical care was necessary to treat or prevent imminent or life-threatening deterioration.  Critical care was time spent personally by me on the following activities: development of treatment plan with patient and/or surrogate as well as nursing, discussions with consultants, evaluation of patient's response to treatment, examination of patient, obtaining history from patient or surrogate, ordering and performing treatments and interventions, ordering and review of laboratory studies, ordering and review of  radiographic studies, pulse oximetry and re-evaluation of patient's condition.   Medications Ordered in ED Medications  nitroGLYCERIN (NITROSTAT) SL tablet 0.4 mg (0.4 mg Sublingual Given 05/07/16 0036)  0.9 %  sodium chloride infusion (10 mL/hr Intravenous New Bag/Given 05/07/16 0032)  aspirin chewable tablet 324 mg (0 mg Oral Hold 05/07/16 0031)  aspirin chewable tablet 324 mg (324 mg Oral Given 05/07/16 0027)  heparin injection 4,000 Units (4,000 Units Intravenous Given 05/07/16 0027)   Initial Impression / Assessment and Plan / ED Course  I have reviewed the triage vital signs and the nursing notes.  Pertinent labs & imaging results that were available during my care of the patient were reviewed by me and considered in my medical decision making (see chart for details).  Clinical Course     Patient presents with chest pain. I was brought EKG from triage obtained at 2351. Baseline wander but markedly different than prior and showed ischemic ST depressions. Repeat EKG requested. Persistent markedly abnormal depressions in the inferior leads. 1-1.5 mm ST elevation in V1 and V2.  Cardiology is in the department. Decision made to call code STEMI. Patient given heparin and aspirin. Will go to the cardiac cath lab emergently.    Final Clinical Impressions(s) / ED Diagnoses   Final diagnoses:  ST elevation myocardial infarction (STEMI), unspecified artery (HCC)    New Prescriptions New Prescriptions   No medications on file   I personally performed the services described in this documentation, which was scribed in my presence. The recorded information has been reviewed and is accurate.    Shon Batonourtney F Horton, MD 05/07/16 [phone]

## 2016-05-07 NOTE — Progress Notes (Addendum)
Inpatient Diabetes Program Recommendations  AACE/ADA: New Consensus Statement on Inpatient Glycemic Control (2015)  Target Ranges:  Prepandial:   less than 140 mg/dL      Peak postprandial:   less than 180 mg/dL (1-2 hours)      Critically ill patients:  140 - 180 mg/dL   Lab Results  Component Value Date   GLUCAP 294 (H) 05/07/2016   HGBA1C 8.8 10/20/2014    Review of Glycemic Control:  Results for Douglas Carr, Sandra M (MRN [id number]) as of 05/07/2016 11:02  Ref. Range 05/07/2016 08:35  Glucose-Capillary Latest Ref Range: 65 - 99 mg/dL 213294 (H)   Y8MA1C pending Diabetes history: Type 2 diabetes Outpatient Diabetes medications: Tresiba 160 units daily, Novolog 6-10 units tid with meals prn CBG>200 mg/dL Current orders for Inpatient glycemic control:  Lantus 160 units q HS, Novolog resistant tid with meals  Inpatient Diabetes Program Recommendations:   Please consider reduction of Lantus to 80 units daily. Called and discussed with PA.  Orders received.    Thanks, Beryl MeagerJenny Yeraldi Fidler, RN, BC-ADM Inpatient Diabetes Coordinator Pager [phone] (8a-5p)

## 2016-05-07 NOTE — H&P (Signed)
Physician History and Physical    Patient ID: Douglas SermonsClifford Carr Mallis MRN: [id number] DOB/AGE: Jun 28, 1965 51 y.o. Admit date: 05/07/2016  Primary Care Physician: Douglas KusterMILLER, Douglas M, MD Primary Cardiologist: Douglas BattyJonathan Berry MD  HPI: 51 yo WM with history of vasculopathy. Known diffuse CAD by cardiac cath in 2010. S/p right CEA in 2010. Presents to ED tonight with worsening chest pain and SOB. States pain began 2 days ago. Described as mid sternal chest pain radiating into his back. Associated with SOB. Last night also had diaphoresis. Tonight dyspnea became worse and he presented to ED. Ecg showed evidence of lateral STEMI. No prior history of MI. History of poorly controlled DM on insulin. History of HTN, mixed hyperlipidemia and family history of CAD.   Review of systems complete and found to be negative unless listed above  Past Medical History:  Diagnosis Date  . Anginal pain (HCC)    secondary to sm. vessel disease  . Anxiety   . Arthritis    BIL KNEE PAIN AND BIL ANKLE PAIN  . Bone spur of ankle   . Coronary artery disease    Remote MI at 51 years of age, Last cath 2010-diffuse non-obstructive disease; last echo 06/16/08 -normal LV function, moderate concentric hypertrophy; Last nuc 08/2008 no ischemia;  medical therapy  . Diabetes mellitus    ON ORAL MEDICATION AND INSULIN  . Hyperlipidemia   . Hypertension   . Myocardial infarction 1996   Post MI  . Peripheral vascular disease (HCC)    HAS LEFT CAROTID ARTERY STENOSIS   AND IS S/P RIGHT CAROTID ENDARTERECTOMY 2010 Last carotid dopplers 01/08/2012 wth patent endarterectomy site    Family History  Problem Relation Age of Onset  . Hypertension Mother   . Diabetes Mother   . Hypertension Maternal Grandfather   . Heart attack Paternal Grandfather   . Heart attack Father [id number]    Social History   Social History  . Marital status: Married    Spouse name: Douglas Carr  . Number of children: 3  . Years of education: N/A   Occupational History    . inspector for [employer]    Social History Main Topics  . Smoking status: Former Smoker    Packs/day: 2.00    Years: 22.00    Types: Cigarettes    Quit date: 10/23/2011  . Smokeless tobacco: Never Used  . Alcohol use 0.0 oz/week    2 - 3 Cans of beer per week     Comment: occasional  . Drug use: No  . Sexual activity: Not on file   Other Topics Concern  . Not on file   Social History Narrative  . No narrative on file    Past Surgical History:  Procedure Laterality Date  . APPENDECTOMY    . CARDIAC CATHETERIZATION     FEB 2010, significant branch vessel disease wth diag, marginal, PDA & PLA, nml. LV function  . CAROTID ENDARTERECTOMY  09/2008   Rt CEA  . LUNG BIOPSY  2013   Bx's suggest granulomatous dz.  . RT ANKLE   2013     Prescriptions Prior to Admission  Medication Sig Dispense Refill Last Dose  . aspirin 325 MG tablet Take 325 mg by mouth daily.   Taking  . atorvastatin (LIPITOR) 20 MG tablet Take 1 tablet every other day 90 tablet 3 Taking  . B-D ULTRAFINE III SHORT PEN 31G X 8 MM MISC USE TO INJECT INSULIN DAILY 100 each 11 Taking  .  insulin aspart (NOVOLOG FLEXPEN) 100 UNIT/ML FlexPen Inject 6 to 10 units 3 times a day prior to each meal and as needed for blood sugar greater than 200 15 pen 3 Taking  . Insulin Degludec (TRESIBA FLEXTOUCH) 200 UNIT/ML SOPN Inject 160 Units into the skin daily. 27 mL 5 Taking  . losartan-hydrochlorothiazide (HYZAAR) 100-25 MG tablet Take 1 tablet by mouth daily. 90 tablet 3   . ONETOUCH VERIO test strip TEST TWICE DAILY 100 each 11 Taking    Physical Exam: Blood pressure 139/84, pulse (!) 105, temperature 98.4 F (36.9 C), temperature source Oral, resp. rate 15, height 5\' 9"  (1.753 Carr), weight 245 lb (111.1 kg), SpO2 97 %.  Current Weight  05/06/16 245 lb (111.1 kg)  02/19/16 243 lb (110.2 kg)  11/14/15 226 lb (102.5 kg)  GENERAL:  Obese WM in mild distress.  HEENT:  PERRL, EOMI, sclera are clear. Oropharynx is  clear. NECK:  No jugular venous distention- difficult to assess with thick neck, carotid upstroke brisk and symmetric, no bruits, no thyromegaly or adenopathy. Old right CEA scar. LUNGS:  Clear to auscultation bilaterally CHEST:  Unremarkable HEART:  RRR,  PMI not displaced or sustained,S1 and S2 within normal limits, no S3, no S4: no clicks, no rubs, no murmurs ABD:  Soft, nontender. BS +, no masses or bruits. No hepatomegaly, no splenomegaly EXT:  2 + DP and PT pulses.absent right radial pulse, no edema, no cyanosis no clubbing SKIN:  Warm and dry.  No rashes NEURO:  Alert and oriented x 3. Cranial nerves II through XII intact. PSYCH:  Cognitively intact    Labs:   Lab Results  Component Value Date   WBC 13.7 (H) 05/07/2016   HGB 14.6 05/07/2016   HCT 42.1 05/07/2016   MCV 83.2 05/07/2016   PLT 157 05/07/2016     Recent Labs Lab 05/07/16 0022  NA 136  K 4.5  CL 100*  CO2 23  BUN 29*  CREATININE 1.29*  CALCIUM 8.9  PROT 6.8  BILITOT 0.5  ALKPHOS 48  ALT 44  AST 74*  GLUCOSE 359*   Lab Results  Component Value Date   CKMB 0.9 06/16/2008   CKMB 0.8 06/16/2008   CKMB 0.9 06/16/2008   TROPONINI 10.31 (HH) 05/07/2016   TROPONINI <0.01        NO INDICATION OF MYOCARDIAL INJURY. 06/16/2008   TROPONINI 0.01        NO INDICATION OF MYOCARDIAL INJURY. 06/16/2008    Lab Results  Component Value Date   CHOL 244 (H) 05/07/2016   CHOL 253 (H) 10/20/2014   CHOL 305 (H) 09/21/2013   Lab Results  Component Value Date   HDL 37 (L) 05/07/2016   HDL 27 (L) 10/20/2014   HDL 30 (L) 09/21/2013   Lab Results  Component Value Date   LDLCALC 170 (H) 05/07/2016   LDLCALC 154 (H) 10/20/2014   LDLCALC Comment 09/21/2013   Lab Results  Component Value Date   TRIG 185 (H) 05/07/2016   TRIG 362 (H) 10/20/2014   TRIG 522 (H) 09/21/2013   Lab Results  Component Value Date   CHOLHDL 6.6 05/07/2016   CHOLHDL 9.4 (H) 10/20/2014   CHOLHDL 10.2 (H) 09/21/2013   No  results found for: LDLDIRECT  Lab Results  Component Value Date   PROBNP <30.0 06/16/2008   Lab Results  Component Value Date   TSH 1.737 Test methodology is 3rd generation TSH 06/16/2008   Lab Results  Component Value Date  HGBA1C 8.8 10/20/2014    Radiology: Dg Chest 2 View  Result Date: 05/07/2016 CLINICAL DATA:  Chest pain and dyspnea for 2 days. EXAM: CHEST  2 VIEW COMPARISON:  01/06/2012, 04/12/2012. FINDINGS: Calcified granulomatous changes are again evident, stable. There is diffuse interstitial thickening which may represent interstitial edema. This is new. No confluent airspace consolidation. No large effusions. Hilar and mediastinal contours are unremarkable and unchanged. IMPRESSION: New interstitial thickening. This may represent interstitial edema although a component of fibrosis is not excluded. Electronically Signed   By: Ellery Plunk Carr.D.   On: 05/07/2016 00:24    EKG: sinus tachy with ST elevation in leads V1-3 and avl. Reciprocal ST depression in the inferior leads. Q waves in anterior precordial leads.   ASSESSMENT AND PLAN:  1. Acute lateral STEMI. Loaded with ASA and given IV heparin in ED. Plan to proceed with emergent cardiac cath and PCI if indicated. 2. Acute CHF. LV function is unknown. IV diuresis. Assess LV function by Echo 3. 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 Poorly controlled on very high dose insulin. Continue basal insulin with SSI. 4. Mixed hyperlipidemia. Start high dose statin. If lipids not at goal may need to consdier PCSK9 inhibitor. 5. HTN. Continue losartan. Add beta blocker.  6. Carotid arterial disease s/p right CEA. Follow up doppler studies.   Signed: Kamie Korber [place], MDFACC  05/07/2016, 2:23 AM

## 2016-05-07 NOTE — ED Notes (Signed)
Called [facility] for stemi

## 2016-05-07 NOTE — ED Notes (Signed)
Elevated I-stat trop of 11.52 reported to Dr. Wilkie AyeHorton

## 2016-05-07 NOTE — Progress Notes (Signed)
   05/07/16 0025  Clinical Encounter Type  Visited With Patient;Family  Visit Type ED  Spiritual Encounters  Spiritual Needs Emotional  Stress Factors  Patient Stress Factors Not reviewed  Family Stress Factors Family relationships  In ED during page. Escorted family to consult B for doctor to explain situation. Pt to Cath Lab and escorted family to Cath Lab waiting area.

## 2016-05-07 NOTE — ED Notes (Signed)
Escorted patient to cath lab with Dr. SwazilandJordan, bedside report given to cath lab team.

## 2016-05-07 NOTE — Progress Notes (Signed)
   Seen earlier today by Dr. SwazilandJordan within the history and physical was performed.  Digital images were reviewed. He has severe diffuse diabetic coronary disease.  Successful stenting of a subtotally occluded second obtuse marginal. No recurrence of angina.  Presenting symptoms that led to hospital was dyspnea not chest discomfort.  LVEDP was extremely elevated. 2-D Doppler echocardiogram to be performed. Medical therapy will be adjusted based upon findings.  Current therapy includes IV Lasix 40 mg every 12 hours, newly added carvedilol 3.125 mg twice a day. Losartan HCT is on hold.

## 2016-05-07 NOTE — Progress Notes (Signed)
CARDIAC REHAB PHASE I   PRE:  Rate/Rhythm103 ST  BP:  Sitting: 109/77        SaO2: 100 RA  MODE:  Ambulation: 470 ft   POST:  Rate/Rhythm: 113 ST  BP:  Sitting: 120/79         SaO2: 99 RA  Pt ambulated 470 ft on RA, handheld assist, steady gait, tolerated well with no complaints, other than general fatigue. Completed MI/stent education with pt and wife at bedside.  Reviewed risk factors, MI book, anti-platelet therapy, stent card, activity restrictions, ntg, exercise, heart healthy diet,and phase 2 cardiac rehab. Left diabetes diet handout for pt to review, will discuss in greater detail tomorrow as pt states he is tired. Pt verbalized understanding, receptive to education. Pt agrees to phase 2 cardiac rehab, [facility]. Pt to recliner after walk, call bell within reach. Will follow.  [phone] Joylene GrapesEmily C Jrue Yambao, RN, BSN 05/07/2016 2:46 PM

## 2016-05-07 NOTE — Progress Notes (Signed)
Right groin arterial sheath was removed at 0427 and pressure held for 17 minutes until hemostasis was achieved. Patient was educated about pre and post sheath pull complications. Patient knows when to call nurse. Patient's vital signs were stable during sheath pull . Palpable distal pulses post sheath pull.

## 2016-05-08 ENCOUNTER — Inpatient Hospital Stay (HOSPITAL_COMMUNITY): Payer: BC Managed Care – PPO

## 2016-05-08 DIAGNOSIS — I5023 Acute on chronic systolic (congestive) heart failure: Secondary | ICD-10-CM

## 2016-05-08 DIAGNOSIS — I429 Cardiomyopathy, unspecified: Secondary | ICD-10-CM

## 2016-05-08 DIAGNOSIS — I2129 ST elevation (STEMI) myocardial infarction involving other sites: Principal | ICD-10-CM

## 2016-05-08 DIAGNOSIS — Z955 Presence of coronary angioplasty implant and graft: Secondary | ICD-10-CM

## 2016-05-08 LAB — POCT I-STAT, CHEM 8
BUN: 31 mg/dL — ABNORMAL HIGH (ref 6–20)
CHLORIDE: 100 mmol/L — AB (ref 101–111)
Calcium, Ion: 1.21 mmol/L (ref 1.15–1.40)
Creatinine, Ser: 1 mg/dL (ref 0.61–1.24)
GLUCOSE: 340 mg/dL — AB (ref 65–99)
HEMATOCRIT: 39 % (ref 39.0–52.0)
Hemoglobin: 13.3 g/dL (ref 13.0–17.0)
POTASSIUM: 4.1 mmol/L (ref 3.5–5.1)
Sodium: 135 mmol/L (ref 135–145)
TCO2: 22 mmol/L (ref 0–100)

## 2016-05-08 LAB — GLUCOSE, CAPILLARY
GLUCOSE-CAPILLARY: 128 mg/dL — AB (ref 65–99)
Glucose-Capillary: 171 mg/dL — ABNORMAL HIGH (ref 65–99)
Glucose-Capillary: 281 mg/dL — ABNORMAL HIGH (ref 65–99)
Glucose-Capillary: 170 mg/dL — ABNORMAL HIGH (ref 65–99)

## 2016-05-08 LAB — ECHOCARDIOGRAM COMPLETE
Height: 69 in
Weight: 3777.6 oz

## 2016-05-08 LAB — TROPONIN I
TROPONIN I: 10.83 ng/mL — AB (ref <0.03)
TROPONIN I: 8.65 ng/mL — AB (ref <0.03)
Troponin I: 13.06 ng/mL (ref <0.03)
Troponin I: 8.1 ng/mL (ref <0.03)

## 2016-05-08 LAB — BRAIN NATRIURETIC PEPTIDE: B Natriuretic Peptide: 483.1 pg/mL — ABNORMAL HIGH (ref 0.0–100.0)

## 2016-05-08 LAB — HEMOGLOBIN A1C
HEMOGLOBIN A1C: 9.3 % — AB (ref 4.8–5.6)
MEAN PLASMA GLUCOSE: 220 mg/dL

## 2016-05-08 LAB — POCT ACTIVATED CLOTTING TIME: ACTIVATED CLOTTING TIME: 637 s

## 2016-05-08 MED ORDER — SPIRONOLACTONE 25 MG PO TABS
12.5000 mg | ORAL_TABLET | Freq: Every day | ORAL | Status: DC
  Administered 2016-05-08 – 2016-05-11 (×4): 12.5 mg via ORAL
  Filled 2016-05-08 (×4): qty 1

## 2016-05-08 MED ORDER — ISOSORBIDE MONONITRATE ER 30 MG PO TB24
30.0000 mg | ORAL_TABLET | Freq: Every day | ORAL | Status: DC
  Administered 2016-05-08 – 2016-05-11 (×4): 30 mg via ORAL
  Filled 2016-05-08 (×4): qty 1

## 2016-05-08 MED ORDER — FUROSEMIDE 40 MG PO TABS
40.0000 mg | ORAL_TABLET | Freq: Every day | ORAL | Status: DC
  Administered 2016-05-08 – 2016-05-09 (×2): 40 mg via ORAL
  Filled 2016-05-08 (×2): qty 1

## 2016-05-08 NOTE — Progress Notes (Signed)
CARDIAC REHAB PHASE I   PRE:  Rate/Rhythm: 101 ST  BP:  Supine:   Sitting: 102/58  Standing:    SaO2: 97%RA  MODE:  Ambulation: 940 ft   POST:  Rate/Rhythm: 119 ST  BP:  Supine:   Sitting: 143/82  Standing:    SaO2: 94%RA 1000-1022 Pt walked 940 ft on RA with steady gait. No c/o CP until sitting on side of bed after walk. Stated a tightness about 2 on scale on 1-10. "like you have been in the cold and your chest burns a little". Hit call bell to see if RN wanted to do EKG and started to put oxygen on when pt stated it was already gone. May have lasted 1 to 2 minutes. Reviewed carb counting with pt. He stated he had been to classes before. Has diabetic diet.   Luetta Nuttingharlene Ethelbert Thain, RN BSN  05/08/2016 10:19 AM

## 2016-05-08 NOTE — Progress Notes (Signed)
Inpatient Diabetes Program Recommendations  AACE/ADA: New Consensus Statement on Inpatient Glycemic Control (2015)  Target Ranges:  Prepandial:   less than 140 mg/dL      Peak postprandial:   less than 180 mg/dL (1-2 hours)      Critically ill patients:  140 - 180 mg/dL   Lab Results  Component Value Date   GLUCAP 171 (H) 05/08/2016   HGBA1C 9.3 (H) 05/07/2016    Review of Glycemic Control:  Results for Douglas Carr, Douglas M (MRN [id number]) as of 05/08/2016 11:21  Ref. Range 05/07/2016 08:35 05/07/2016 11:35 05/07/2016 15:29 05/07/2016 21:51 05/08/2016 08:41  Glucose-Capillary Latest Ref Range: 65 - 99 mg/dL 409294 (H) 811284 (H) 914230 (H) 186 (H) 171 (H)   Inpatient Diabetes Program Recommendations:    Blood sugars improved this morning.  May consider adding Novolog meal coverage 8 units tid with meals (hold if patient eats less than 50%).  Thanks, Beryl MeagerJenny Lashina Milles, RN, BC-ADM Inpatient Diabetes Coordinator Pager [phone] (8a-5p)

## 2016-05-08 NOTE — Progress Notes (Signed)
Patient Name: Douglas SermonsClifford M Klare Date of Encounter: 05/08/2016  Primary Cardiologist: Peter SwazilandJordan, M.D.  Hospital Problem List     Principal Problem:   Acute ST elevation myocardial infarction (STEMI) involving left circumflex coronary artery (HCC) Active Problems:   Hypertension   Diabetes mellitus (HCC)   Combined hyperlipidemia associated with type 2 diabetes mellitus   STEMI involving left circumflex coronary artery (HCC)     Subjective   Long-standing history of exertional dyspnea greater than chest discomfort. Both dyspnea and chest discomfort led to admission. He presented with "STEMI" and was treated with DES to the second obtuse marginal-oversized for the severely and diffusely diseased vessel  Had post ambulation chest burning/discomfort this morning. Breathing is somewhat improved compared to admission.  Inpatient Medications    Scheduled Meds: . aspirin  81 mg Oral Daily  . atorvastatin  80 mg Oral q1800  . carvedilol  3.125 mg Oral BID WC  . furosemide  40 mg Intravenous Q12H  . heparin  5,000 Units Subcutaneous Q8H  . insulin aspart  0-20 Units Subcutaneous TID WC  . insulin glargine  80 Units Subcutaneous Daily  . isosorbide mononitrate  30 mg Oral Daily  . losartan  50 mg Oral Daily  . sodium chloride flush  3 mL Intravenous Q12H  . sodium chloride flush  3 mL Intravenous Q12H  . ticagrelor  90 mg Oral BID   Continuous Infusions:  PRN Meds: sodium chloride, sodium chloride, acetaminophen, ALPRAZolam, guaiFENesin-dextromethorphan, nitroGLYCERIN, ondansetron (ZOFRAN) IV, sodium chloride flush, sodium chloride flush   Vital Signs    Vitals:   05/08/16 0840 05/08/16 0900 05/08/16 1000 05/08/16 1100  BP:   (!) 102/58   Pulse:  99 97 (!) 101  Resp:  19 (!) 22 (!) 22  Temp: 98.4 F (36.9 C)     TempSrc: Oral     SpO2:  98% 97% 100%  Weight:      Height:        Intake/Output Summary (Last 24 hours) at 05/08/16 1152 Last data filed at 05/08/16  0800  Gross per 24 hour  Intake              720 ml  Output             1215 ml  Net             -495 ml   Net intake and output less than 500 cc negative since admission.  Filed Weights   05/06/16 2359 05/07/16 0226 05/08/16 0300  Weight: 245 lb (111.1 kg) 238 lb 1.6 oz (108 kg) 236 lb 1.6 oz (107.1 kg)    Physical Exam  Obese, in no acute distress. GEN: Well nourished, well developed, in no acute distress.  HEENT: Grossly normal.  Neck: Supple, no JVD, carotid bruits, or masses. Cardiac: RRR, no murmurs, rubs, or gallops. No clubbing, cyanosis, edema.  Radials/DP/PT 2+ and equal bilaterally.  Respiratory:  Respirations regular and unlabored, clear to auscultation bilaterally. GI: Soft, nontender, nondistended, BS + x 4. MS: no deformity or atrophy. Skin: warm and dry, no rash. Neuro:  Strength and sensation are intact. Psych: AAOx3.  Normal affect.  Labs    CBC  Recent Labs  05/07/16 0022 05/07/16 0315  WBC 13.7* 14.3*  NEUTROABS 9.0*  --   HGB 14.6 14.3  HCT 42.1 42.0  MCV 83.2 82.5  PLT 157 156   Basic Metabolic Panel  Recent Labs  05/07/16 0022 05/07/16 0315  NA 136  135  K 4.5 4.2  CL 100* 103  CO2 23 21*  GLUCOSE 359* 287*  BUN 29* 28*  CREATININE 1.29* 1.17  CALCIUM 8.9 8.7*  MG  --  2.3   Liver Function Tests  Recent Labs  05/07/16 0022 05/07/16 0315  AST 74* 69*  ALT 44 43  ALKPHOS 48 42  BILITOT 0.5 0.7  PROT 6.8 6.6  ALBUMIN 3.3* 3.2*   No results for input(s): LIPASE, AMYLASE in the last 72 hours. Cardiac Enzymes  Recent Labs  05/07/16 2034 05/08/16 0208 05/08/16 0850  TROPONINI 11.90* 13.06* 10.83*   BNP Invalid input(s): POCBNP D-Dimer No results for input(s): DDIMER in the last 72 hours. Hemoglobin A1C  Recent Labs  05/07/16 0315  HGBA1C 9.3*   Fasting Lipid Panel  Recent Labs  05/07/16 0315  CHOL 245*  HDL 37*  LDLCALC 171*  TRIG 183*  CHOLHDL 6.6   Thyroid Function Tests  Recent Labs   05/07/16 0315  TSH 3.503    Telemetry    Normal sinus rhythm - Personally Reviewed  ECG    NSR with LAA and old anteroseptal MI with diffuse ST abnormality.- Personally Reviewed  Radiology    Dg Chest 2 View  Result Date: 05/07/2016 CLINICAL DATA:  Chest pain and dyspnea for 2 days. EXAM: CHEST  2 VIEW COMPARISON:  01/06/2012, 04/12/2012. FINDINGS: Calcified granulomatous changes are again evident, stable. There is diffuse interstitial thickening which may represent interstitial edema. This is new. No confluent airspace consolidation. No large effusions. Hilar and mediastinal contours are unremarkable and unchanged. IMPRESSION: New interstitial thickening. This may represent interstitial edema although a component of fibrosis is not excluded. Electronically Signed   By: Ellery Plunk M.D.   On: 05/07/2016 00:24    Cardiac Studies   Cardiac Catheterization and PCI results: 05/07/16 Diagnostic Diagram     Post-Intervention Diagram      LVEDP was 36 mmHg during the acute procedure.  Patient Profile     51 year old gentleman with a vasculopathic history, known diffuse coronary disease by cath 2010, right carotid endarterectomy 2010, diabetes type 2 poorly controlled, and who presented with "STEMI" on 05/07/16. Major complaint was dyspnea with some background mild chest discomfort. Other problems include hypertension, hyperlipidemia and significant family history of CAD. LVEF by echo 2010 was greater than 55%.  Underwent urgent cath with stenting of the second obtuse marginal. Severe diffuse native disease involving the proximal circumflex, LAD, and native right coronary. Targets may not be graftable.  Assessment & Plan    1. Native coronary artery disease, severe and diffuse in nature related to poorly controlled diabetes and genetic predisposition. 2. Acute coronary syndrome/STEMI treated with DES to the second obtuse marginal. Significant residual coronary disease remains. 3.  Suspect acute on chronic systolic heart failure with associated dyspnea on exertion, responding to diuretic therapy. Awaiting results of updated echocardiogram. Continue aggressive diuresis. Optimize therapy based upon LV function. Assumption is that beta blocker therapy and angiotensin receptor blockade will be needed/continued. Overall poor prognosis.  4. Obesity  5. Non-insulin-dependent diabetes mellitus, poorly compliant. Insulin-dependent  5. Suspected sleep apnea  Signed, Lesleigh Noe, MD  05/08/2016, 11:52 AM

## 2016-05-09 LAB — TROPONIN I
TROPONIN I: 10.17 ng/mL — AB (ref <0.03)
TROPONIN I: 9.06 ng/mL — AB (ref <0.03)
Troponin I: 7.82 ng/mL (ref <0.03)
Troponin I: 7.29 ng/mL (ref <0.03)

## 2016-05-09 LAB — GLUCOSE, CAPILLARY
GLUCOSE-CAPILLARY: 263 mg/dL — AB (ref 65–99)
Glucose-Capillary: 115 mg/dL — ABNORMAL HIGH (ref 65–99)
Glucose-Capillary: 108 mg/dL — ABNORMAL HIGH (ref 65–99)
Glucose-Capillary: 109 mg/dL — ABNORMAL HIGH (ref 65–99)
Glucose-Capillary: 120 mg/dL — ABNORMAL HIGH (ref 65–99)

## 2016-05-09 LAB — BASIC METABOLIC PANEL
Anion gap: 9 (ref 5–15)
BUN: 41 mg/dL — AB (ref 6–20)
CO2: 25 mmol/L (ref 22–32)
CREATININE: 1.31 mg/dL — AB (ref 0.61–1.24)
Calcium: 8.2 mg/dL — ABNORMAL LOW (ref 8.9–10.3)
Chloride: 99 mmol/L — ABNORMAL LOW (ref 101–111)
GFR calc Af Amer: >60 mL/min (ref >60)
Glucose, Bld: 116 mg/dL — ABNORMAL HIGH (ref 65–99)
Potassium: 3.6 mmol/L (ref 3.5–5.1)
SODIUM: 133 mmol/L — AB (ref 135–145)

## 2016-05-09 MED ORDER — FUROSEMIDE 80 MG PO TABS
80.0000 mg | ORAL_TABLET | Freq: Two times a day (BID) | ORAL | Status: AC
  Administered 2016-05-09 – 2016-05-10 (×2): 80 mg via ORAL
  Filled 2016-05-09 (×2): qty 1

## 2016-05-09 MED ORDER — LOSARTAN POTASSIUM 50 MG PO TABS
100.0000 mg | ORAL_TABLET | Freq: Every day | ORAL | Status: DC
  Administered 2016-05-10 – 2016-05-11 (×2): 100 mg via ORAL
  Filled 2016-05-09 (×2): qty 2

## 2016-05-09 MED ORDER — CARVEDILOL 3.125 MG PO TABS
4.6875 mg | ORAL_TABLET | Freq: Two times a day (BID) | ORAL | Status: DC
  Administered 2016-05-10: 4.6875 mg via ORAL
  Filled 2016-05-09: qty 2

## 2016-05-09 NOTE — Progress Notes (Addendum)
Patient Name: Douglas Carr Date of Encounter: 05/09/2016  Primary Cardiologist: Peter Swaziland, M.D.  Hospital Problem List     Principal Problem:   Acute ST elevation myocardial infarction (STEMI) involving left circumflex coronary artery (HCC) Active Problems:   Hypertension   Diabetes mellitus (HCC)   Combined hyperlipidemia associated with type 2 diabetes mellitus   Status post coronary artery stent placement   Acute on chronic systolic heart failure (HCC)   Acute on chronic systolic heart failure (HCC)     Subjective   Long-standing history of exertional dyspnea greater than chest discomfort. Both dyspnea and chest discomfort led to admission. He presented with "STEMI" and was treated with DES to the second obtuse marginal-oversized for the severely and diffusely diseased vessel  Had post ambulation chest burning/discomfort this morning. Breathing is somewhat improved compared to admission.  Inpatient Medications    Scheduled Meds: . aspirin  81 mg Oral Daily  . atorvastatin  80 mg Oral q1800  . carvedilol  3.125 mg Oral BID WC  . furosemide  40 mg Oral Daily  . heparin  5,000 Units Subcutaneous Q8H  . insulin aspart  0-20 Units Subcutaneous TID WC  . insulin glargine  80 Units Subcutaneous Daily  . isosorbide mononitrate  30 mg Oral Daily  . losartan  50 mg Oral Daily  . sodium chloride flush  3 mL Intravenous Q12H  . spironolactone  12.5 mg Oral Daily  . ticagrelor  90 mg Oral BID   Continuous Infusions:  PRN Meds: sodium chloride, acetaminophen, ALPRAZolam, guaiFENesin-dextromethorphan, nitroGLYCERIN, ondansetron (ZOFRAN) IV, sodium chloride flush   Vital Signs    Vitals:   05/08/16 2045 05/09/16 0522 05/09/16 0828 05/09/16 1354  BP: 113/63 (!) 131/57 126/68 126/73  Pulse: (!) 103 92 (!) 101 92  Resp: 20 18 20 20   Temp: 98.4 F (36.9 C) 97.8 F (36.6 C) 98.4 F (36.9 C) 98 F (36.7 C)  TempSrc: Oral Oral Oral Oral  SpO2: 96% 97% 98% 100%    Weight:  239 lb 12.8 oz (108.8 kg)    Height:        Intake/Output Summary (Last 24 hours) at 05/09/16 1453 Last data filed at 05/09/16 1230  Gross per 24 hour  Intake              480 ml  Output                0 ml  Net              480 ml   Net intake and output less than 500 cc negative since admission.  Filed Weights   05/07/16 0226 05/08/16 0300 05/09/16 0522  Weight: 238 lb 1.6 oz (108 kg) 236 lb 1.6 oz (107.1 kg) 239 lb 12.8 oz (108.8 kg)    Physical Exam  Obese, in no acute distress. GEN: Well nourished, well developed, in no acute distress.  HEENT: Grossly normal.  Neck: Supple, no JVD, carotid bruits, or masses. Cardiac: RRR, no murmurs, rubs, or gallops. No clubbing, cyanosis, edema.  Radials/DP/PT 2+ and equal bilaterally.  Respiratory:  Respirations regular and unlabored, clear to auscultation bilaterally. GI: Soft, nontender, nondistended, BS + x 4. MS: no deformity or atrophy. Skin: warm and dry, no rash. Neuro:  Strength and sensation are intact. Psych: AAOx3.  Normal affect.  Labs    CBC  Recent Labs  05/07/16 0022 05/07/16 0106 05/07/16 0315  WBC 13.7*  --  14.3*  NEUTROABS 9.0*  --   --  HGB 14.6 13.3 14.3  HCT 42.1 39.0 42.0  MCV 83.2  --  82.5  PLT 157  --  156   Basic Metabolic Panel  Recent Labs  05/07/16 0315 05/09/16 0221  NA 135 133*  K 4.2 3.6  CL 103 99*  CO2 21* 25  GLUCOSE 287* 116*  BUN 28* 41*  CREATININE 1.17 1.31*  CALCIUM 8.7* 8.2*  MG 2.3  --    Liver Function Tests  Recent Labs  05/07/16 0022 05/07/16 0315  AST 74* 69*  ALT 44 43  ALKPHOS 48 42  BILITOT 0.5 0.7  PROT 6.8 6.6  ALBUMIN 3.3* 3.2*   No results for input(s): LIPASE, AMYLASE in the last 72 hours. Cardiac Enzymes  Recent Labs  05/09/16 0221 05/09/16 0807 05/09/16 1345  TROPONINI 10.17* 9.06* 7.82*   BNP Invalid input(s): POCBNP D-Dimer No results for input(s): DDIMER in the last 72 hours. Hemoglobin A1C  Recent Labs   05/07/16 0315  HGBA1C 9.3*   Fasting Lipid Panel  Recent Labs  05/07/16 0315  CHOL 245*  HDL 37*  LDLCALC 171*  TRIG 183*  CHOLHDL 6.6   Thyroid Function Tests  Recent Labs  05/07/16 0315  TSH 3.503    Telemetry    Normal sinus rhythm - Personally Reviewed  ECG    NSR with LAA and old anteroseptal MI with diffuse ST abnormality.- Personally Reviewed  Radiology    No results found.  Cardiac Studies   Cardiac Catheterization and PCI results: 05/07/16 Diagnostic Diagram     Post-Intervention Diagram      LVEDP was 36 mmHg during the acute procedure.  ECHOCARDIOGRAM 05/08/16: Study Conclusions  - Left ventricle: The cavity size was normal. Wall thickness was   increased in a pattern of mild LVH. Systolic function was   moderately to severely reduced. The estimated ejection fraction   was in the range of 30% to 35%. Anterior, mid to distal   anteroseptal, apical and inferoapical akinesis suggestive of   ischemia/infarct. The study is not technically sufficient to   allow evaluation of LV diastolic function. Ejection fraction   (MOD, 2-plane): 31%. - Aortic valve: Sclerosis without stenosis. There was no   regurgitation. - Mitral valve: Mildly thickened leaflets . There was trivial   regurgitation. - Left atrium: The atrium was normal in size. - Right atrium: The atrium was normal in size. - Atrial septum: Aneurysmal IAS - cannot exclude a small PFO. - Inferior vena cava: The vessel was dilated. The respirophasic   diameter changes were blunted (< 50%), consistent with elevated   central venous pressure.  Impressions:  - LVEF 30-35%, anterior, mid to distal anteroseptal, apical and   inferoapical akinesis suggestive of ischemia/infarct, mild LVH,   normal biatrial size, aneurysmal interatrial septum - small PFO   cannot be excluded, dilated IVC.    Patient Profile     51 year old gentleman with a vasculopathic history, known diffuse coronary  disease by cath 2010, right carotid endarterectomy 2010, diabetes type 2 poorly controlled, and who presented with "STEMI" on 05/07/16. Major complaint was dyspnea with some background mild chest discomfort. Other problems include hypertension, hyperlipidemia and significant family history of CAD. LVEF by echo 2010 was greater than 55%.Repeat echo on this admission reveals an EF of 30-35% with anterior wall and lateral wall severe hypokinesis.  Underwent urgent cath with stenting of the second obtuse marginal. Severe diffuse native disease involving the proximal circumflex, LAD, and native right coronary. Targets may not  be graftable.  Assessment & Plan    1. Native coronary artery disease, severe and diffuse in nature related to poorly controlled diabetes and genetic predisposition.Severe diffuse disease in branches of the circumflex is probably non-graftable and not good targets for intervention. Moderate diffuse disease in the RCA, probably not critical at this time and currently probably no role for PCI or surgery. Distal left main/ostial LAD linear dissection that is non-flow-limiting.. Plan up titrate long-acting nitrates. 2. Acute coronary syndrome/STEMI treated with DES to the second obtuse marginal. Significant residual coronary disease remains. 3. Suspect acute on chronic systolic heart failure as anticipated, the left ventricular systolic function is severely impaired with EF 30-35%. Plan up titrate ARB therapy (losartan to 100 mg per day). Will slightly increased beta blocker therapy today (carvedilol 4.5 mg twice a day). Also need to be more aggressive with diuresis (will try and 80 mg dose of Lasix by mouth this p.m. and in a.m.). Kidney function needs to be monitored. Discussed the need for aggressive heart failure therapy and consideration for AICD if LV function does not improve  4. Obesity  5. Non-insulin-dependent diabetes mellitus, poorly compliant.  Insulin-dependent    Signed, Lesleigh Noe, MD  05/09/2016, 2:53 PM

## 2016-05-09 NOTE — Progress Notes (Signed)
CARDIAC REHAB PHASE I   PRE:  Rate/Rhythm: 100 ST  BP:  Supine:   Sitting: 128/74  Standing:    SaO2:   MODE:  Ambulation: 890 ft   POST:  Rate/Rhythm: 113 ST  BP:  Supine:   Sitting: 144/79  Standing:    SaO2: 100%RA 1030-1057 Pt walked 890 ft with steady gait and no CP. Tolerated well. With low EF, pt and wife educated on signs/symptoms of CHF. CHF booklet given, 2000 mg sodium restriction and 2LFR. Gave low sodium diets. Encouraged pt to weigh daily and reviewed when to call MD. Understanding voiced.   Luetta Nuttingharlene Tomi Grandpre, RN BSN  05/09/2016 10:53 AM

## 2016-05-09 NOTE — Progress Notes (Signed)
Confirmed with pt and wife that they have the Brilinta 30 day free card and copay assist card- info given on copay of $30/mo.

## 2016-05-10 LAB — BASIC METABOLIC PANEL
ANION GAP: 9 (ref 5–15)
BUN: 30 mg/dL — AB (ref 6–20)
CHLORIDE: 102 mmol/L (ref 101–111)
CO2: 26 mmol/L (ref 22–32)
Calcium: 8.2 mg/dL — ABNORMAL LOW (ref 8.9–10.3)
Creatinine, Ser: 1.22 mg/dL (ref 0.61–1.24)
GFR calc Af Amer: >60 mL/min (ref >60)
GFR calc non Af Amer: >60 mL/min (ref >60)
Glucose, Bld: 132 mg/dL — ABNORMAL HIGH (ref 65–99)
POTASSIUM: 3.4 mmol/L — AB (ref 3.5–5.1)
Sodium: 137 mmol/L (ref 135–145)

## 2016-05-10 LAB — GLUCOSE, CAPILLARY
GLUCOSE-CAPILLARY: 110 mg/dL — AB (ref 65–99)
GLUCOSE-CAPILLARY: 314 mg/dL — AB (ref 65–99)
Glucose-Capillary: 127 mg/dL — ABNORMAL HIGH (ref 65–99)
Glucose-Capillary: 158 mg/dL — ABNORMAL HIGH (ref 65–99)

## 2016-05-10 LAB — TROPONIN I
TROPONIN I: 7.06 ng/mL — AB (ref <0.03)
TROPONIN I: 5.93 ng/mL — AB (ref <0.03)
TROPONIN I: 5.73 ng/mL — AB (ref <0.03)
Troponin I: 6.28 ng/mL (ref <0.03)

## 2016-05-10 MED ORDER — CARVEDILOL 6.25 MG PO TABS
6.2500 mg | ORAL_TABLET | Freq: Two times a day (BID) | ORAL | Status: DC
  Administered 2016-05-10 – 2016-05-11 (×2): 6.25 mg via ORAL
  Filled 2016-05-10 (×2): qty 1

## 2016-05-10 MED ORDER — POTASSIUM CHLORIDE CRYS ER 20 MEQ PO TBCR
20.0000 meq | EXTENDED_RELEASE_TABLET | Freq: Every day | ORAL | Status: DC
  Administered 2016-05-10 – 2016-05-11 (×2): 20 meq via ORAL
  Filled 2016-05-10 (×2): qty 1

## 2016-05-10 NOTE — Progress Notes (Addendum)
Patient Name: Douglas Carr Date of Encounter: 05/10/2016  Primary Cardiologist: Peter Swaziland, M.D.  Hospital Problem List     Principal Problem:   Acute ST elevation myocardial infarction (STEMI) involving left circumflex coronary artery (HCC) Active Problems:   Hypertension   Diabetes mellitus (HCC)   Combined hyperlipidemia associated with type 2 diabetes mellitus   Status post coronary artery stent placement   Acute on chronic systolic heart failure (HCC)     Subjective   Long-standing history of exertional dyspnea greater than chest discomfort. Both dyspnea and chest discomfort led to admission. He presented with "STEMI" and was treated with DES to the second obtuse marginal-oversized for the severely and diffusely diseased vessel   Inpatient Medications    Scheduled Meds: . aspirin  81 mg Oral Daily  . atorvastatin  80 mg Oral q1800  . carvedilol  4.6875 mg Oral BID WC  . heparin  5,000 Units Subcutaneous Q8H  . insulin aspart  0-20 Units Subcutaneous TID WC  . insulin glargine  80 Units Subcutaneous Daily  . isosorbide mononitrate  30 mg Oral Daily  . losartan  100 mg Oral Daily  . sodium chloride flush  3 mL Intravenous Q12H  . spironolactone  12.5 mg Oral Daily  . ticagrelor  90 mg Oral BID   Continuous Infusions:  PRN Meds: sodium chloride, acetaminophen, ALPRAZolam, guaiFENesin-dextromethorphan, nitroGLYCERIN, ondansetron (ZOFRAN) IV, sodium chloride flush   Vital Signs    Vitals:   05/09/16 1354 05/09/16 1745 05/09/16 2031 05/10/16 0621  BP: 126/73 136/74 115/70 118/66  Pulse: 92 (!) 113 (!) 102 93  Resp: 20  18 18   Temp: 98 F (36.7 C)  98.1 F (36.7 C) 97.7 F (36.5 C)  TempSrc: Oral  Oral Oral  SpO2: 100%  96% 93%  Weight:    239 lb 9.6 oz (108.7 kg)  Height:        Intake/Output Summary (Last 24 hours) at 05/10/16 0953 Last data filed at 05/09/16 1800  Gross per 24 hour  Intake              600 ml  Output              100 ml    Net              500 ml   Net intake and output less than 500 cc negative since admission.  Filed Weights   05/08/16 0300 05/09/16 0522 05/10/16 0621  Weight: 236 lb 1.6 oz (107.1 kg) 239 lb 12.8 oz (108.8 kg) 239 lb 9.6 oz (108.7 kg)    Physical Exam  Obese, in no acute distress. GEN: Well nourished, well developed, in no acute distress.  HEENT: Grossly normal.  Neck: Supple, no JVD, carotid bruits, or masses. Cardiac: RRR, no murmurs, rubs, or gallops. No clubbing, cyanosis, edema.  Radials/DP/PT 2+ and equal bilaterally.  Respiratory:  Respirations regular and unlabored, clear to auscultation bilaterally. GI: Soft, nontender, nondistended, BS + x 4. MS: no deformity or atrophy. Skin: warm and dry, no rash. Neuro:  Strength and sensation are intact. Psych: AAOx3.  Normal affect.  Labs    CBC No results for input(s): WBC, NEUTROABS, HGB, HCT, MCV, PLT in the last 72 hours. Basic Metabolic Panel  Recent Labs  05/09/16 0221 05/10/16 0159  NA 133* 137  K 3.6 3.4*  CL 99* 102  CO2 25 26  GLUCOSE 116* 132*  BUN 41* 30*  CREATININE 1.31* 1.22  CALCIUM 8.2*  8.2*   Liver Function Tests No results for input(s): AST, ALT, ALKPHOS, BILITOT, PROT, ALBUMIN in the last 72 hours. No results for input(s): LIPASE, AMYLASE in the last 72 hours. Cardiac Enzymes  Recent Labs  05/09/16 1345 05/09/16 2031 05/10/16 0159  TROPONINI 7.82* 7.29* 7.06*   BNP Invalid input(s): POCBNP D-Dimer No results for input(s): DDIMER in the last 72 hours. Hemoglobin A1C No results for input(s): HGBA1C in the last 72 hours. Fasting Lipid Panel No results for input(s): CHOL, HDL, LDLCALC, TRIG, CHOLHDL, LDLDIRECT in the last 72 hours. Thyroid Function Tests No results for input(s): TSH, T4TOTAL, T3FREE, THYROIDAB in the last 72 hours.  Invalid input(s): FREET3  Telemetry    Normal sinus rhythm - Personally Reviewed  ECG    NSR with LAA and old anteroseptal MI with diffuse ST  abnormality.- Personally Reviewed  Radiology    No results found.  Cardiac Studies   Cardiac Catheterization and PCI results: 05/07/16 Diagnostic Diagram     Post-Intervention Diagram      LVEDP was 36 mmHg during the acute procedure.  ECHOCARDIOGRAM 05/08/16: Study Conclusions  - Left ventricle: The cavity size was normal. Wall thickness was   increased in a pattern of mild LVH. Systolic function was   moderately to severely reduced. The estimated ejection fraction   was in the range of 30% to 35%. Anterior, mid to distal   anteroseptal, apical and inferoapical akinesis suggestive of   ischemia/infarct. The study is not technically sufficient to   allow evaluation of LV diastolic function. Ejection fraction   (MOD, 2-plane): 31%. - Aortic valve: Sclerosis without stenosis. There was no   regurgitation. - Mitral valve: Mildly thickened leaflets . There was trivial   regurgitation. - Left atrium: The atrium was normal in size. - Right atrium: The atrium was normal in size. - Atrial septum: Aneurysmal IAS - cannot exclude a small PFO. - Inferior vena cava: The vessel was dilated. The respirophasic   diameter changes were blunted (< 50%), consistent with elevated   central venous pressure.  Impressions:  - LVEF 30-35%, anterior, mid to distal anteroseptal, apical and   inferoapical akinesis suggestive of ischemia/infarct, mild LVH,   normal biatrial size, aneurysmal interatrial septum - small PFO   cannot be excluded, dilated IVC.    Patient Profile     51 year old gentleman with a vasculopathic history, known diffuse coronary disease by cath 2010, right carotid endarterectomy 2010, diabetes type 2 poorly controlled, and who presented with "STEMI" on 05/07/16. Major complaint was dyspnea with some background mild chest discomfort. Other problems include hypertension, hyperlipidemia and significant family history of CAD. LVEF by echo 2010 was greater than 55%.Repeat echo  on this admission reveals an EF of 30-35% with anterior wall and lateral wall severe hypokinesis.  Underwent urgent cath with stenting of the second obtuse marginal. Severe diffuse native disease involving the proximal circumflex, LAD, and native right coronary. Targets may not be graftable.  Assessment & Plan    1. Native coronary artery disease, severe and diffuse in nature related to poorly controlled diabetes and genetic predisposition.Severe diffuse disease in branches of the circumflex is probably non-graftable and not good targets for intervention. Moderate diffuse disease in the RCA, probably not critical at this time and currently probably no role for PCI or surgery. Distal left main/ostial LAD linear dissection that is non-flow-limiting. Plan up titrate long-acting nitrates.  Continue ASA/statin/BB/Imdur and Brilinta.  2. Acute coronary syndrome/STEMI treated with DES to the second obtuse  marginal. Significant residual coronary disease remains.  3. Suspect acute on chronic systolic heart failure as anticipated, the left ventricular systolic function is severely impaired with EF 30-35%. Weight down 6 lbs (245>>239 lbs). I&O's appear inaccurate.  Continue Lasix 80mg  BID PO. Continue losartan to 100 mg daily. Tolerating carvedilol 4.5 mg twice a day so will increase to 6.25mg  BID.  Renal function and BP stable.  Discussed the need for aggressive heart failure therapy and consideration for AICD if LV function does not improve. Consider change to [facility] as outpt. Continue aldactone.  After he has been on [facility] for CHF for 2 months repeat EF and if still depressed will need AICD.  4. Obesity  5. Non-insulin-dependent diabetes mellitus, poorly compliant. Insulin-dependent  6.  Hypokalemia - replete  Probable d/c home in am   Signed, Armanda Magic, MD  05/10/2016, 9:53 AM

## 2016-05-10 NOTE — Progress Notes (Signed)
CARDIAC REHAB PHASE I   PRE:  Rate/Rhythm: 94  BP:  Sitting: 124/74     SaO2: 96  MODE:  Ambulation: 1000 ft   POST:  Rate/Rhythm: 109  BP:  Sitting: 152/87     SaO2: 98%  9:54am-10:10am Patient ambulated independently at a steady pace. Asked if had any questions regarding education-patient stated no. Placed in chair with telephone and call bell in reach. Friend at bedside.   Barnabas ListerMolly M Genevieve Ritzel, MS 05/10/2016 10:05 AM

## 2016-05-11 LAB — BASIC METABOLIC PANEL
Anion gap: 8 (ref 5–15)
BUN: 29 mg/dL — ABNORMAL HIGH (ref 6–20)
CO2: 26 mmol/L (ref 22–32)
CREATININE: 1.22 mg/dL (ref 0.61–1.24)
Calcium: 8.3 mg/dL — ABNORMAL LOW (ref 8.9–10.3)
Chloride: 99 mmol/L — ABNORMAL LOW (ref 101–111)
GFR calc Af Amer: >60 mL/min (ref >60)
Glucose, Bld: 237 mg/dL — ABNORMAL HIGH (ref 65–99)
Potassium: 3.8 mmol/L (ref 3.5–5.1)
Sodium: 133 mmol/L — ABNORMAL LOW (ref 135–145)

## 2016-05-11 LAB — GLUCOSE, CAPILLARY
Glucose-Capillary: 171 mg/dL — ABNORMAL HIGH (ref 65–99)
Glucose-Capillary: 139 mg/dL — ABNORMAL HIGH (ref 65–99)

## 2016-05-11 LAB — TROPONIN I
TROPONIN I: 4.93 ng/mL — AB (ref <0.03)
TROPONIN I: 3.52 ng/mL — AB (ref <0.03)

## 2016-05-11 MED ORDER — FUROSEMIDE 80 MG PO TABS: 80.0000 mg | ORAL_TABLET | Freq: Two times a day (BID) | ORAL | Status: DC

## 2016-05-11 MED ORDER — ISOSORBIDE MONONITRATE ER 30 MG PO TB24: 30.0000 mg | ORAL_TABLET | Freq: Every day | ORAL | 3 refills | Status: DC

## 2016-05-11 MED ORDER — FUROSEMIDE 40 MG PO TABS: 40.0000 mg | ORAL_TABLET | Freq: Two times a day (BID) | ORAL | 3 refills | Status: DC

## 2016-05-11 MED ORDER — TICAGRELOR 90 MG PO TABS: 90.0000 mg | ORAL_TABLET | Freq: Two times a day (BID) | ORAL | 3 refills | Status: DC

## 2016-05-11 MED ORDER — CARVEDILOL 6.25 MG PO TABS: 6.2500 mg | ORAL_TABLET | Freq: Two times a day (BID) | ORAL | 3 refills | Status: DC

## 2016-05-11 MED ORDER — FUROSEMIDE 40 MG PO TABS
40.0000 mg | ORAL_TABLET | Freq: Two times a day (BID) | ORAL | Status: DC
Start: 2016-05-11 — End: 2016-05-11
  Administered 2016-05-11: 40 mg via ORAL
  Filled 2016-05-11: qty 1

## 2016-05-11 MED ORDER — ATORVASTATIN CALCIUM 80 MG PO TABS: 80.0000 mg | ORAL_TABLET | Freq: Every day | ORAL | 3 refills | Status: DC

## 2016-05-11 MED ORDER — ASPIRIN 81 MG PO CHEW: 81.0000 mg | CHEWABLE_TABLET | Freq: Every day | ORAL | Status: DC

## 2016-05-11 MED ORDER — POTASSIUM CHLORIDE CRYS ER 20 MEQ PO TBCR: 20.0000 meq | EXTENDED_RELEASE_TABLET | Freq: Every day | ORAL | 3 refills | Status: DC

## 2016-05-11 MED ORDER — NITROGLYCERIN 0.4 MG SL SUBL: 0.4000 mg | SUBLINGUAL_TABLET | SUBLINGUAL | 2 refills | Status: DC | PRN

## 2016-05-11 MED ORDER — LOSARTAN POTASSIUM 100 MG PO TABS: 100.0000 mg | ORAL_TABLET | Freq: Every day | ORAL | 3 refills | Status: DC

## 2016-05-11 MED ORDER — SPIRONOLACTONE 25 MG PO TABS: 12.5000 mg | ORAL_TABLET | Freq: Every day | ORAL | 3 refills | Status: DC

## 2016-05-11 MED ORDER — ACETAMINOPHEN 325 MG PO TABS: 650.0000 mg | ORAL_TABLET | ORAL | Status: DC | PRN

## 2016-05-11 NOTE — Progress Notes (Addendum)
Patient Name: Margarita SermonsClifford M Shad Date of Encounter: 05/11/2016  Primary Cardiologist: Peter SwazilandJordan, M.D.  Hospital Problem List     Principal Problem:   Acute ST elevation myocardial infarction (STEMI) involving left circumflex coronary artery (HCC) Active Problems:   Hypertension   Diabetes mellitus (HCC)   Combined hyperlipidemia associated with type 2 diabetes mellitus   Status post coronary artery stent placement   Acute on chronic systolic heart failure (HCC)     Subjective   Long-standing history of exertional dyspnea greater than chest discomfort. Both dyspnea and chest discomfort led to admission. He presented with "STEMI" and was treated with DES to the second obtuse marginal-oversized for the severely and diffusely diseased vessel   Inpatient Medications    Scheduled Meds: . aspirin  81 mg Oral Daily  . atorvastatin  80 mg Oral q1800  . carvedilol  6.25 mg Oral BID WC  . heparin  5,000 Units Subcutaneous Q8H  . insulin aspart  0-20 Units Subcutaneous TID WC  . insulin glargine  80 Units Subcutaneous Daily  . isosorbide mononitrate  30 mg Oral Daily  . losartan  100 mg Oral Daily  . potassium chloride  20 mEq Oral Daily  . sodium chloride flush  3 mL Intravenous Q12H  . spironolactone  12.5 mg Oral Daily  . ticagrelor  90 mg Oral BID   Continuous Infusions:  PRN Meds: sodium chloride, acetaminophen, ALPRAZolam, guaiFENesin-dextromethorphan, nitroGLYCERIN, ondansetron (ZOFRAN) IV, sodium chloride flush   Vital Signs    Vitals:   05/10/16 0621 05/10/16 1255 05/10/16 2033 05/11/16 0524  BP: 118/66 113/61 117/66 121/70  Pulse: 93 79 93 92  Resp: 18 20 20 20   Temp: 97.7 F (36.5 C) 97.8 F (36.6 C) 98.1 F (36.7 C) 97.5 F (36.4 C)  TempSrc: Oral Oral Oral Oral  SpO2: 93% 96% 96% 98%  Weight: 239 lb 9.6 oz (108.7 kg)   239 lb 12.8 oz (108.8 kg)  Height:        Intake/Output Summary (Last 24 hours) at 05/11/16 0937 Last data filed at 05/10/16  1800  Gross per 24 hour  Intake              360 ml  Output                0 ml  Net              360 ml   Net intake and output less than 500 cc negative since admission.  Filed Weights   05/09/16 0522 05/10/16 0621 05/11/16 0524  Weight: 239 lb 12.8 oz (108.8 kg) 239 lb 9.6 oz (108.7 kg) 239 lb 12.8 oz (108.8 kg)    Physical Exam  Obese, in no acute distress. GEN: Well nourished, well developed, in no acute distress.  HEENT: Grossly normal.  Neck: Supple, no JVD, carotid bruits, or masses. Cardiac: RRR, no murmurs, rubs, or gallops. No clubbing, cyanosis, edema.  Radials/DP/PT 2+ and equal bilaterally.  Respiratory:  Respirations regular and unlabored, clear to auscultation bilaterally. GI: Soft, nontender, nondistended, BS + x 4. MS: no deformity or atrophy. Skin: warm and dry, no rash. Neuro:  Strength and sensation are intact. Psych: AAOx3.  Normal affect.  Labs    CBC No results for input(s): WBC, NEUTROABS, HGB, HCT, MCV, PLT in the last 72 hours. Basic Metabolic Panel  Recent Labs  05/10/16 0159 05/11/16 0210  NA 137 133*  K 3.4* 3.8  CL 102 99*  CO2 26  26  GLUCOSE 132* 237*  BUN 30* 29*  CREATININE 1.22 1.22  CALCIUM 8.2* 8.3*   Liver Function Tests No results for input(s): AST, ALT, ALKPHOS, BILITOT, PROT, ALBUMIN in the last 72 hours. No results for input(s): LIPASE, AMYLASE in the last 72 hours. Cardiac Enzymes  Recent Labs  05/10/16 1426 05/10/16 2000 05/11/16 0210  TROPONINI 5.93* 5.73* 4.93*   BNP Invalid input(s): POCBNP D-Dimer No results for input(s): DDIMER in the last 72 hours. Hemoglobin A1C No results for input(s): HGBA1C in the last 72 hours. Fasting Lipid Panel No results for input(s): CHOL, HDL, LDLCALC, TRIG, CHOLHDL, LDLDIRECT in the last 72 hours. Thyroid Function Tests No results for input(s): TSH, T4TOTAL, T3FREE, THYROIDAB in the last 72 hours.  Invalid input(s): FREET3  Telemetry    Normal sinus rhythm -  Personally Reviewed  ECG    NSR with LAA and old anteroseptal MI with diffuse ST abnormality.- Personally Reviewed  Radiology    No results found.  Cardiac Studies   Cardiac Catheterization and PCI results: 05/07/16 Diagnostic Diagram     Post-Intervention Diagram      LVEDP was 36 mmHg during the acute procedure.  ECHOCARDIOGRAM 05/08/16: Study Conclusions  - Left ventricle: The cavity size was normal. Wall thickness was   increased in a pattern of mild LVH. Systolic function was   moderately to severely reduced. The estimated ejection fraction   was in the range of 30% to 35%. Anterior, mid to distal   anteroseptal, apical and inferoapical akinesis suggestive of   ischemia/infarct. The study is not technically sufficient to   allow evaluation of LV diastolic function. Ejection fraction   (MOD, 2-plane): 31%. - Aortic valve: Sclerosis without stenosis. There was no   regurgitation. - Mitral valve: Mildly thickened leaflets . There was trivial   regurgitation. - Left atrium: The atrium was normal in size. - Right atrium: The atrium was normal in size. - Atrial septum: Aneurysmal IAS - cannot exclude a small PFO. - Inferior vena cava: The vessel was dilated. The respirophasic   diameter changes were blunted (< 50%), consistent with elevated   central venous pressure.  Impressions:  - LVEF 30-35%, anterior, mid to distal anteroseptal, apical and   inferoapical akinesis suggestive of ischemia/infarct, mild LVH,   normal biatrial size, aneurysmal interatrial septum - small PFO   cannot be excluded, dilated IVC.    Patient Profile     51 year old gentleman with a vasculopathic history, known diffuse coronary disease by cath 2010, right carotid endarterectomy 2010, diabetes type 2 poorly controlled, and who presented with "STEMI" on 05/07/16. Major complaint was dyspnea with some background mild chest discomfort. Other problems include hypertension, hyperlipidemia and  significant family history of CAD. LVEF by echo 2010 was greater than 55%.Repeat echo on this admission reveals an EF of 30-35% with anterior wall and lateral wall severe hypokinesis.  Underwent urgent cath with stenting of the second obtuse marginal. Severe diffuse native disease involving the proximal circumflex, LAD, and native right coronary. Targets may not be graftable.  Assessment & Plan    1. Native coronary artery disease, severe and diffuse in nature related to poorly controlled diabetes and genetic predisposition.Severe diffuse disease in branches of the circumflex is probably non-graftable and not good targets for intervention. Moderate diffuse disease in the RCA, probably not critical at this time and currently probably no role for PCI or surgery. Distal left main/ostial LAD linear dissection that is non-flow-limiting. Plan up titrate long-acting nitrates.  Continue ASA/statin/BB/Imdur and Brilinta.  2. Acute coronary syndrome/STEMI treated with DES to the second obtuse marginal. Significant residual coronary disease remains.  3. Suspect acute on chronic systolic heart failure as anticipated, the left ventricular systolic function is severely impaired with EF 30-35%. Weight down 6 lbs (245>>239 lbs). I&O's appear inaccurate.  Change Lasix to 40mg  BID PO. Continue losartan to 100 mg daily. Continue Coreg 6.25mg  BID and Aldactone.  Renal function and BP stable.  Discussed the need for aggressive heart failure therapy and consideration for AICD if LV function does not improve. Consider change to [facility] as outpt. After he has been on max medical Rx for CHF for 2 months repeat EF and if still depressed will need AICD.  4. Obesity  5. Non-insulin-dependent diabetes mellitus, poorly compliant. Insulin-dependent  6.  Hypokalemia - repleted  He is stable from a cardiac standpoint for discharge today.  Will need TOC OV late next week.  Repeat echo in 2 months.  Will send home on Lasix 40mg  BID  and needs BMET on Tuesday.   Signed, Armanda Magic, MD  05/11/2016, 9:37 AM

## 2016-05-11 NOTE — Discharge Instructions (Signed)
Heart Attack °A heart attack (myocardial infarction, MI) causes damage to your heart that cannot be fixed. A heart attack can happen when a heart (coronary) artery becomes blocked or narrowed. This cuts off the blood supply and oxygen to your heart. °When one or more of your coronary arteries becomes blocked, that area of your heart begins to die. This causes the pain you feel during a heart attack. Heart attack pain can also occur in one part of the body but be felt in another part of the body (referred pain). You may feel referred heart attack pain in your left arm, neck, or jaw. Pain may even be felt in the right arm. °What are the causes? °Many conditions can cause a heart attack. These include: °· Atherosclerosis. This is when a fatty substance (plaque) gradually builds up in the arteries. This buildup can block or reduce the blood supply to one or more of the heart arteries. °· A blood clot. A blood clot can develop suddenly when plaque breaks up (ruptures) within a heart artery. A blood clot can block the heart artery, which prevents blood flow to the heart. °· Severe tightening (spasm) of the heart artery. This cuts off blood flow through the artery. °What increases the risk? °  °People at risk for heart attack usually have one or more of the following risk factors: °· High blood pressure (hypertension). °· High cholesterol. °· Smoking. °· Being male. °· Being overweight or obese. °· Older aged. °· A family history of heart disease. °· Lack of exercise. °· Diabetes. °· Stress. °· Drinking too much alcohol. °· Using illegal street drugs, such as cocaine and methamphetamines. °What are the signs or symptoms? °Heart attack symptoms can vary from person to person. Symptoms depend on factors like gender and age. °· In both men and women, heart attack symptoms can include the following: °¨ Chest pain. This may feel like crushing, squeezing, or a feeling of pressure. °¨ Shortness of breath. °¨ Heartburn or  indigestion with or without vomiting, shortness of breath, or sweating. °¨ Sudden cold sweats. °¨ Sudden light-headedness. °¨ Upper back pain. °· Women can have unique heart attack symptoms, such as: °¨ Unexplained feelings of nervousness or anxiety. °¨ Discomfort between the shoulder blades or upper back. °¨ Tingling in the hands and arms. °· Older people (of both genders) can have subtle heart attack symptoms, such as: °¨ Sweating. °¨ Shortness of breath. °¨ General tiredness or not feeling well. °How is this diagnosed? °Diagnosing a heart attack involves several tests. They include: °· An assessment of your vital signs. This includes checking your: °¨ Heart rhythm. °¨ Blood pressure. °¨ Breathing rate. °¨ Oxygen level. °· An ECG (electrocardiogram) to measure the electrical activity of your heart. °· Blood tests called cardiac markers. In these tests, blood is drawn at scheduled times to check for the specific proteins or enzymes released by damaged heart muscle. °· A chest X-ray. °· An echocardiogram to evaluate heart motion and blood flow. °· Coronary angiography to look at the heart arteries. °How is this treated? °Treatment for a heart attack may include: °· Medicine that breaks up or dissolves blood clots in the heart artery. °· Angioplasty. °· Cardiac stent placement. °· Intra-aortic balloon pump therapy (IABP). °· Open heart surgery (coronary artery bypass graft, CABG). °Follow these instructions at home: °· Take medicines only as directed by your health care provider. You may need to take medicine after a heart attack to: °¨ Keep your blood from clotting   too easily. °¨ Control your blood pressure. °¨ Lower your cholesterol. °¨ Control abnormal heart rhythms. °· Do not take the following medicines unless your health care provider approves: °¨ Nonsteroidal anti-inflammatory drugs (NSAIDs), such as ibuprofen, naproxen, or celecoxib. °¨ Vitamin supplements that contain vitamin A, vitamin E, or  both. °¨ Hormone replacement therapy that contains estrogen with or without progestin. °· Make lifestyle changes as directed by your health care provider. These may include: °¨ Quitting smoking, if you smoke. °¨ Getting regular exercise. Ask your health care provider to suggest some activities that are safe for you. °¨ Eating a heart-healthy diet. A registered dietitian can help you learn healthy eating options. °¨ Maintaining a healthy weight. °¨ Managing diabetes, if necessary. °¨ Reducing stress. °¨ Limiting how much alcohol you drink. °Get help right away if: °· You have sudden, unexplained chest discomfort. °· You have sudden, unexplained discomfort in your arms, back, neck, or jaw. °· You have shortness of breath at any time. °· You suddenly start to sweat or your skin gets clammy. °· You feel nauseous or vomit. °· You suddenly feel light-headed or dizzy. °· Your heart begins to beat fast or feels like it is skipping beats. °These symptoms may represent a serious problem that is an emergency. Do not wait to see if the symptoms will go away. Get medical help right away. Call your local emergency services (911 in the [place] Do not drive yourself to the hospital.  °This information is not intended to replace advice given to you by your health care provider. Make sure you discuss any questions you have with your health care provider. °Document Released: 04/21/2005 Document Revised: 09/27/2015 Document Reviewed: 06/24/2013 °Elsevier Interactive Patient Education © 2017 [employer] °Coronary Angiogram With Stent, Care After °This sheet gives you information about how to care for yourself after your procedure. Your health care provider may also give you more specific instructions. If you have problems or questions, contact your health care provider. °What can I expect after the procedure? °After your procedure, it is common to have: °· Bruising in the area where a small, thin tube (catheter) was inserted. This  usually fades within 1-2 weeks. °· Blood collecting in the tissue (hematoma) that may be painful to the touch. It should usually decrease in size and tenderness within 1-2 weeks. °Follow these instructions at home: °Insertion area care °· Do not take baths, swim, or use a hot tub until your health care provider approves. °· You may shower 24-48 hours after the procedure or as directed by your health care provider. °· Follow instructions from your health care provider about how to take care of your incision. Make sure you: °¨ Wash your hands with soap and water before you change your bandage (dressing). If soap and water are not available, use hand sanitizer. °¨ Change your dressing as told by your health care provider. °¨ Leave stitches (sutures), skin glue, or adhesive strips in place. These skin closures may need to stay in place for 2 weeks or longer. If adhesive strip edges start to loosen and curl up, you may trim the loose edges. Do not remove adhesive strips completely unless your health care provider tells you to do that. °· Remove the bandage (dressing) and gently wash the catheter insertion site with plain soap and water. °· Pat the area dry with a clean towel. Do not rub the area, because that may cause bleeding. °· Do not apply powder or lotion to the incision area. °·   Check your incision area every day for signs of infection. Check for: °¨ More redness, swelling, or pain. °¨ More fluid or blood. °¨ Warmth. °¨ Pus or a bad smell. °Activity °· Do not drive for 24 hours if you were given a medicine to help you relax (sedative). °· Do not lift anything that is heavier than 10 lb (4.5 kg) for 5 days after your procedure or as directed by your health care provider. °· Ask your health care provider when it is okay for you: °¨ To return to work or school. °¨ To resume usual physical activities or sports. °¨ To resume sexual activity. °Eating and drinking °· Eat a heart-healthy diet. This should include plenty  of fresh fruits and vegetables. °· Avoid the following types of food: °¨ Food that is high in salt. °¨ Canned or highly processed food. °¨ Food that is high in saturated fat or sugar. °¨ Fried food. °· Limit alcohol intake to no more than 1 drink a day for non-pregnant women and 2 drinks a day for men. One drink equals 12 oz of beer, 5 oz of wine, or 1½ oz of hard liquor. °Lifestyle °· Do not use any products that contain nicotine or tobacco, such as cigarettes and e-cigarettes. If you need help quitting, ask your health care provider. °· Take steps to manage and control your weight. °· Get regular exercise. °· Manage your blood pressure. °· Manage other health problems, such as diabetes. °General instructions °· Take over-the-counter and prescription medicines only as told by your health care provider. Blood thinners may be prescribed after your procedure to improve blood flow through the stent. °· If you need an MRI after your heart stent has been placed, be sure to tell the health care provider who orders the MRI that you have a heart stent. °· Keep all follow-up visits as directed by your health care provider. This is important. °Contact a health care provider if: °· You have a fever. °· You have chills. °· You have increased bleeding from the catheter insertion area. Hold pressure on the area. °Get help right away if: °· You develop chest pain or shortness of breath. °· You feel faint or you pass out. °· You have unusual pain at the catheter insertion area. °· You have redness, warmth, or swelling at the catheter insertion area. °· You have drainage (other than a small amount of blood on the dressing) from the catheter insertion area. °· The catheter insertion area is bleeding, and the bleeding does not stop after 30 minutes of holding steady pressure on the area. °· You develop bleeding from any other place, such as from your rectum. There may be bright red blood in your urine or stool, or it may appear as  black, tarry stool. °This information is not intended to replace advice given to you by your health care provider. Make sure you discuss any questions you have with your health care provider. °Document Released: 11/08/2004 Document Revised: 01/17/2016 Document Reviewed: 01/17/2016 °Elsevier Interactive Patient Education © 2017 [employer] ° °

## 2016-05-11 NOTE — Discharge Summary (Signed)
Patient ID: Douglas SermonsClifford M Carr,  MRN: [id number], DOB/AGE: March 02, 1966 51 y.o.  Admit date: 05/07/2016 Discharge date: 05/11/2016  Primary Care Provider: Frederica KusterMILLER, Douglas M, MD Primary Cardiologist: Douglas Carr  Discharge Diagnoses Principal Problem:   Acute ST elevation myocardial infarction (STEMI) involving left circumflex coronary artery [facility](HCC) Active Problems:   Hypertension   Diabetes mellitus (HCC)   Combined hyperlipidemia associated with type 2 diabetes mellitus   Status post coronary artery stent placement   Acute on chronic systolic heart failure [facility](HCC)    Procedures: Urgent cath/ PCI 05/07/16   Hospital Course:  51 year old gentleman followed by Douglas Carr with a vasculopathic history with known diffuse coronary disease by cath 2010, right carotid endarterectomy 2010, diabetes type 2 (poorly controlled), who presented with "STEMI" on 05/07/16. Major complaint on admission was dyspnea with some background mild chest discomfort. Other problems include hypertension, hyperlipidemia and significant family history of CAD.   The pt underwent urgent cath 05/07/16 with stenting of the second obtuse marginal. He had residual severe diffuse native disease involving the proximal circumflex, LAD, and native right coronary, to be treated medically for now. His course was complicated by acute CHF. EF by echo was 30-35%. He was diuresed from 245 lbs- to 239 lbs. He'll need a repeat echo in 2-3 months on aggressive medical Rx to see if he may need an ICD. Consider adding Entresto as an OP.   The pt was seen by Douglas Carr the morning of the 7 th and felt to be stable for discharge with plans for an early TOC f/u next week with a BMP then.   Discharge Vitals:  Blood pressure 121/70, pulse 92, temperature 97.5 F (36.4 C), temperature source Oral, resp. rate 20, height 5\' 9"  (1.753 m), weight 239 lb 12.8 oz (108.8 kg), SpO2 98 %.   Cardiac Studies   Cardiac Catheterization and PCI results:  05/07/16 Diagnostic Diagram     Post-Intervention Diagram      LVEDP was 36 mmHg during the acute procedure.  ECHOCARDIOGRAM 05/08/16: Study Conclusions  - Left ventricle: The cavity size was normal. Wall thickness was increased in a pattern of mild LVH. Systolic function was moderately to severely reduced. The estimated ejection fraction was in the range of 30% to 35%. Anterior, mid to distal anteroseptal, apical and inferoapical akinesis suggestive of ischemia/infarct. The study is not technically sufficient to allow evaluation of LV diastolic function. Ejection fraction (MOD, 2-plane): 31%. - Aortic valve: Sclerosis without stenosis. There was no regurgitation. - Mitral valve: Mildly thickened leaflets . There was trivial regurgitation. - Left atrium: The atrium was normal in size. - Right atrium: The atrium was normal in size. - Atrial septum: Aneurysmal IAS - cannot exclude a small PFO. - Inferior vena cava: The vessel was dilated. The respirophasic diameter changes were blunted (<50%), consistent with elevated central venous pressure.  Impressions:  - LVEF 30-35%, anterior, mid to distal anteroseptal, apical and inferoapical akinesis suggestive of ischemia/infarct, mild LVH, normal biatrial size, aneurysmal interatrial septum - small PFO cannot be excluded, dilated IVC.    Labs: Results for orders placed or performed during the hospital encounter of 05/07/16 (from the past 24 hour(s))  Troponin I (serum)     Status: Abnormal   Collection Time: 05/10/16  2:26 PM  Result Value Ref Range   Troponin I 5.93 (HH) <0.03 ng/mL  Glucose, capillary     Status: Abnormal   Collection Time: 05/10/16  4:30 PM  Result Value Ref Range  Glucose-Capillary 127 (H) 65 - 99 mg/dL   Comment 1 Notify RN    Comment 2 Document in Chart   Troponin I (serum)     Status: Abnormal   Collection Time: 05/10/16  8:00 PM  Result Value Ref Range    Troponin I 5.73 (HH) <0.03 ng/mL  Glucose, capillary     Status: Abnormal   Collection Time: 05/10/16  8:55 PM  Result Value Ref Range   Glucose-Capillary 158 (H) 65 - 99 mg/dL  Troponin I (serum)     Status: Abnormal   Collection Time: 05/11/16  2:10 AM  Result Value Ref Range   Troponin I 4.93 (HH) <0.03 ng/mL  Basic metabolic panel     Status: Abnormal   Collection Time: 05/11/16  2:10 AM  Result Value Ref Range   Sodium 133 (L) 135 - 145 mmol/L   Potassium 3.8 3.5 - 5.1 mmol/L   Chloride 99 (L) 101 - 111 mmol/L   CO2 26 22 - 32 mmol/L   Glucose, Bld 237 (H) 65 - 99 mg/dL   BUN 29 (H) 6 - 20 mg/dL   Creatinine, Ser 1.61 0.61 - 1.24 mg/dL   Calcium 8.3 (L) 8.9 - 10.3 mg/dL   GFR calc non Af Amer >60 >60 mL/min   GFR calc Af Amer >60 >60 mL/min   Anion gap 8 5 - 15  Glucose, capillary     Status: Abnormal   Collection Time: 05/11/16  6:22 AM  Result Value Ref Range   Glucose-Capillary 171 (H) 65 - 99 mg/dL  Troponin I (serum)     Status: Abnormal   Collection Time: 05/11/16 10:46 AM  Result Value Ref Range   Troponin I 3.52 (HH) <0.03 ng/mL  Glucose, capillary     Status: Abnormal   Collection Time: 05/11/16 11:43 AM  Result Value Ref Range   Glucose-Capillary 139 (H) 65 - 99 mg/dL   Comment 1 Notify RN    Comment 2 Document in Chart     Disposition:  Follow-up Information    Nanetta Batty, MD Follow up.   Specialties:  Cardiology, Radiology Why:  Office will contact you next week for an appointment Contact information: [address] [place] [address] [phone]           Discharge Medications:  Allergies as of 05/11/2016   No Known Allergies     Medication List    STOP taking these medications   aspirin 325 MG tablet Replaced by:  aspirin 81 MG chewable tablet   B-D ULTRAFINE III SHORT PEN 31G X 8 MM Misc Generic drug:  Insulin Pen Needle   ibuprofen 200 MG tablet Commonly known as:  ADVIL,MOTRIN    losartan-hydrochlorothiazide 100-25 MG tablet Commonly known as:  HYZAAR   ONETOUCH VERIO test strip Generic drug:  glucose blood     TAKE these medications   acetaminophen 325 MG tablet Commonly known as:  TYLENOL Take 2 tablets (650 mg total) by mouth every 4 (four) hours as needed for headache or mild pain.   aspirin 81 MG chewable tablet Chew 1 tablet (81 mg total) by mouth daily. Start taking on:  05/12/2016 Replaces:  aspirin 325 MG tablet   atorvastatin 80 MG tablet Commonly known as:  LIPITOR Take 1 tablet (80 mg total) by mouth daily at 6 PM. What changed:  medication strength  how much to take  how to take this  when to take this  additional instructions   carvedilol  6.25 MG tablet Commonly known as:  COREG Take 1 tablet (6.25 mg total) by mouth 2 (two) times daily with a meal.   furosemide 40 MG tablet Commonly known as:  LASIX Take 1 tablet (40 mg total) by mouth 2 (two) times daily.   insulin aspart 100 UNIT/ML FlexPen Commonly known as:  NOVOLOG FLEXPEN Inject 6 to 10 units 3 times a day prior to each meal and as needed for blood sugar greater than 200   Insulin Degludec 200 UNIT/ML Sopn Commonly known as:  TRESIBA FLEXTOUCH Inject 160 Units into the skin daily.   isosorbide mononitrate 30 MG 24 hr tablet Commonly known as:  IMDUR Take 1 tablet (30 mg total) by mouth daily. Start taking on:  05/12/2016   losartan 100 MG tablet Commonly known as:  COZAAR Take 1 tablet (100 mg total) by mouth daily. Start taking on:  05/12/2016   nitroGLYCERIN 0.4 MG SL tablet Commonly known as:  NITROSTAT Place 1 tablet (0.4 mg total) under the tongue every 5 (five) minutes x 3 doses as needed for chest pain.   potassium chloride SA 20 MEQ tablet Commonly known as:  K-DUR,KLOR-CON Take 1 tablet (20 mEq total) by mouth daily. Start taking on:  05/12/2016   spironolactone 25 MG tablet Commonly known as:  ALDACTONE Take 0.5 tablets (12.5 mg total) by mouth  daily. Start taking on:  05/12/2016   ticagrelor 90 MG Tabs tablet Commonly known as:  BRILINTA Take 1 tablet (90 mg total) by mouth 2 (two) times daily.        Duration of Discharge Encounter: Greater than 30 minutes including physician time.  Jolene Provost PA-C 05/11/2016 11:53 AM

## 2016-05-12 ENCOUNTER — Inpatient Hospital Stay (HOSPITAL_COMMUNITY)
Admission: EM | Admit: 2016-05-12 | Discharge: 2016-05-15 | DRG: 293 | Disposition: A | Discharge status: Home Health | Payer: BC Managed Care – PPO | Attending: Internal Medicine | Admitting: Internal Medicine

## 2016-05-12 ENCOUNTER — Telehealth: Payer: Self-pay | Admitting: Cardiology

## 2016-05-12 ENCOUNTER — Emergency Department (HOSPITAL_COMMUNITY): Payer: BC Managed Care – PPO

## 2016-05-12 ENCOUNTER — Encounter (HOSPITAL_COMMUNITY): Payer: Self-pay | Admitting: *Deleted

## 2016-05-12 DIAGNOSIS — I2510 Atherosclerotic heart disease of native coronary artery without angina pectoris: Secondary | ICD-10-CM | POA: Diagnosis present

## 2016-05-12 DIAGNOSIS — Z79899 Other long term (current) drug therapy: Secondary | ICD-10-CM

## 2016-05-12 DIAGNOSIS — R0602 Shortness of breath: Secondary | ICD-10-CM | POA: Diagnosis not present

## 2016-05-12 DIAGNOSIS — Z87891 Personal history of nicotine dependence: Secondary | ICD-10-CM

## 2016-05-12 DIAGNOSIS — Z7902 Long term (current) use of antithrombotics/antiplatelets: Secondary | ICD-10-CM

## 2016-05-12 DIAGNOSIS — F419 Anxiety disorder, unspecified: Secondary | ICD-10-CM | POA: Diagnosis present

## 2016-05-12 DIAGNOSIS — I255 Ischemic cardiomyopathy: Secondary | ICD-10-CM | POA: Diagnosis present

## 2016-05-12 DIAGNOSIS — Z955 Presence of coronary angioplasty implant and graft: Secondary | ICD-10-CM

## 2016-05-12 DIAGNOSIS — I5043 Acute on chronic combined systolic (congestive) and diastolic (congestive) heart failure: Secondary | ICD-10-CM | POA: Diagnosis present

## 2016-05-12 DIAGNOSIS — I5042 Chronic combined systolic (congestive) and diastolic (congestive) heart failure: Secondary | ICD-10-CM | POA: Diagnosis present

## 2016-05-12 DIAGNOSIS — E1169 Type 2 diabetes mellitus with other specified complication: Secondary | ICD-10-CM | POA: Diagnosis present

## 2016-05-12 DIAGNOSIS — Z7982 Long term (current) use of aspirin: Secondary | ICD-10-CM

## 2016-05-12 DIAGNOSIS — Z794 Long term (current) use of insulin: Secondary | ICD-10-CM

## 2016-05-12 DIAGNOSIS — I5041 Acute combined systolic (congestive) and diastolic (congestive) heart failure: Secondary | ICD-10-CM

## 2016-05-12 DIAGNOSIS — I110 Hypertensive heart disease with heart failure: Secondary | ICD-10-CM | POA: Diagnosis not present

## 2016-05-12 DIAGNOSIS — E1151 Type 2 diabetes mellitus with diabetic peripheral angiopathy without gangrene: Secondary | ICD-10-CM | POA: Diagnosis present

## 2016-05-12 DIAGNOSIS — Z8249 Family history of ischemic heart disease and other diseases of the circulatory system: Secondary | ICD-10-CM

## 2016-05-12 DIAGNOSIS — E782 Mixed hyperlipidemia: Secondary | ICD-10-CM | POA: Diagnosis present

## 2016-05-12 DIAGNOSIS — Z833 Family history of diabetes mellitus: Secondary | ICD-10-CM

## 2016-05-12 DIAGNOSIS — I252 Old myocardial infarction: Secondary | ICD-10-CM

## 2016-05-12 LAB — BASIC METABOLIC PANEL
ANION GAP: 7 (ref 5–15)
BUN: 22 mg/dL — ABNORMAL HIGH (ref 6–20)
CALCIUM: 8.7 mg/dL — AB (ref 8.9–10.3)
CO2: 27 mmol/L (ref 22–32)
Chloride: 101 mmol/L (ref 101–111)
Creatinine, Ser: 1.11 mg/dL (ref 0.61–1.24)
GLUCOSE: 196 mg/dL — AB (ref 65–99)
Potassium: 4.8 mmol/L (ref 3.5–5.1)
Sodium: 135 mmol/L (ref 135–145)

## 2016-05-12 LAB — CBC
HCT: 36.4 % — ABNORMAL LOW (ref 39.0–52.0)
HEMOGLOBIN: 12.3 g/dL — AB (ref 13.0–17.0)
MCH: 28.5 pg (ref 26.0–34.0)
MCHC: 33.8 g/dL (ref 30.0–36.0)
MCV: 84.3 fL (ref 78.0–100.0)
Platelets: 212 10*3/uL (ref 150–400)
RBC: 4.32 MIL/uL (ref 4.22–5.81)
RDW: 12.6 % (ref 11.5–15.5)
WBC: 9.4 10*3/uL (ref 4.0–10.5)

## 2016-05-12 LAB — I-STAT TROPONIN, ED: TROPONIN I, POC: 2.25 ng/mL — AB (ref 0.00–0.08)

## 2016-05-12 LAB — BRAIN NATRIURETIC PEPTIDE: B Natriuretic Peptide: 610.1 pg/mL — ABNORMAL HIGH (ref 0.0–100.0)

## 2016-05-12 NOTE — ED Triage Notes (Addendum)
Pt recently discharged after being admitted for MI. Pt was told at discharge to return if unable to urinate or weight gain. Pt reports a 3lb weight gain with worsening shob. Pt denies chest pain, concerned for fluid overload. Pt's cardiologist is Dr.Berry, cards office referred pt to ED

## 2016-05-12 NOTE — Telephone Encounter (Signed)
Pt wife called reporting that he has had a weight gain of 8lbs over the past 24 hours. Also feels increasingly dyspneic with minimal activity. States he has not had much UOP at all day. EF was noted at 30-35% during recent admission. Concern for fluid overload. Advised that he to the ED for evaluation given progression of symptoms and little UOP.

## 2016-05-12 NOTE — ED Notes (Signed)
Brittany-RN [facility] and David-CN made aware of pt's elevated troponin

## 2016-05-13 ENCOUNTER — Encounter (HOSPITAL_COMMUNITY): Payer: Self-pay | Admitting: Nurse Practitioner

## 2016-05-13 ENCOUNTER — Telehealth: Payer: Self-pay | Admitting: Cardiology

## 2016-05-13 DIAGNOSIS — Z833 Family history of diabetes mellitus: Secondary | ICD-10-CM | POA: Diagnosis not present

## 2016-05-13 DIAGNOSIS — Z8249 Family history of ischemic heart disease and other diseases of the circulatory system: Secondary | ICD-10-CM | POA: Diagnosis not present

## 2016-05-13 DIAGNOSIS — I252 Old myocardial infarction: Secondary | ICD-10-CM | POA: Diagnosis not present

## 2016-05-13 DIAGNOSIS — Z7902 Long term (current) use of antithrombotics/antiplatelets: Secondary | ICD-10-CM | POA: Diagnosis not present

## 2016-05-13 DIAGNOSIS — F419 Anxiety disorder, unspecified: Secondary | ICD-10-CM | POA: Diagnosis present

## 2016-05-13 DIAGNOSIS — I110 Hypertensive heart disease with heart failure: Secondary | ICD-10-CM | POA: Diagnosis present

## 2016-05-13 DIAGNOSIS — I429 Cardiomyopathy, unspecified: Secondary | ICD-10-CM | POA: Diagnosis not present

## 2016-05-13 DIAGNOSIS — R0602 Shortness of breath: Secondary | ICD-10-CM | POA: Diagnosis present

## 2016-05-13 DIAGNOSIS — E782 Mixed hyperlipidemia: Secondary | ICD-10-CM | POA: Diagnosis present

## 2016-05-13 DIAGNOSIS — I255 Ischemic cardiomyopathy: Secondary | ICD-10-CM | POA: Diagnosis present

## 2016-05-13 DIAGNOSIS — I5042 Chronic combined systolic (congestive) and diastolic (congestive) heart failure: Secondary | ICD-10-CM | POA: Diagnosis present

## 2016-05-13 DIAGNOSIS — E1169 Type 2 diabetes mellitus with other specified complication: Secondary | ICD-10-CM | POA: Diagnosis present

## 2016-05-13 DIAGNOSIS — Z79899 Other long term (current) drug therapy: Secondary | ICD-10-CM | POA: Diagnosis not present

## 2016-05-13 DIAGNOSIS — Z87891 Personal history of nicotine dependence: Secondary | ICD-10-CM | POA: Diagnosis not present

## 2016-05-13 DIAGNOSIS — I5043 Acute on chronic combined systolic (congestive) and diastolic (congestive) heart failure: Secondary | ICD-10-CM

## 2016-05-13 DIAGNOSIS — Z955 Presence of coronary angioplasty implant and graft: Secondary | ICD-10-CM | POA: Diagnosis not present

## 2016-05-13 DIAGNOSIS — Z794 Long term (current) use of insulin: Secondary | ICD-10-CM | POA: Diagnosis not present

## 2016-05-13 DIAGNOSIS — E1151 Type 2 diabetes mellitus with diabetic peripheral angiopathy without gangrene: Secondary | ICD-10-CM | POA: Diagnosis present

## 2016-05-13 DIAGNOSIS — Z7982 Long term (current) use of aspirin: Secondary | ICD-10-CM | POA: Diagnosis not present

## 2016-05-13 DIAGNOSIS — I2510 Atherosclerotic heart disease of native coronary artery without angina pectoris: Secondary | ICD-10-CM | POA: Diagnosis not present

## 2016-05-13 HISTORY — DX: Chronic combined systolic (congestive) and diastolic (congestive) heart failure: I50.42

## 2016-05-13 LAB — COMPREHENSIVE METABOLIC PANEL
ALBUMIN: 2.9 g/dL — AB (ref 3.5–5.0)
ALT: 41 U/L (ref 17–63)
AST: 24 U/L (ref 15–41)
Alkaline Phosphatase: 46 U/L (ref 38–126)
Anion gap: 10 (ref 5–15)
BILIRUBIN TOTAL: 0.2 mg/dL — AB (ref 0.3–1.2)
BUN: 25 mg/dL — AB (ref 6–20)
CHLORIDE: 103 mmol/L (ref 101–111)
CO2: 23 mmol/L (ref 22–32)
CREATININE: 1.03 mg/dL (ref 0.61–1.24)
Calcium: 8.6 mg/dL — ABNORMAL LOW (ref 8.9–10.3)
GFR calc Af Amer: >60 mL/min (ref >60)
GFR calc non Af Amer: >60 mL/min (ref >60)
GLUCOSE: 176 mg/dL — AB (ref 65–99)
Potassium: 4.3 mmol/L (ref 3.5–5.1)
Sodium: 136 mmol/L (ref 135–145)
TOTAL PROTEIN: 6.2 g/dL — AB (ref 6.5–8.1)

## 2016-05-13 LAB — CBG MONITORING, ED
GLUCOSE-CAPILLARY: 176 mg/dL — AB (ref 65–99)
Glucose-Capillary: 156 mg/dL — ABNORMAL HIGH (ref 65–99)

## 2016-05-13 LAB — GLUCOSE, CAPILLARY
GLUCOSE-CAPILLARY: 175 mg/dL — AB (ref 65–99)
Glucose-Capillary: 189 mg/dL — ABNORMAL HIGH (ref 65–99)

## 2016-05-13 LAB — TROPONIN I
TROPONIN I: 2.22 ng/mL — AB (ref <0.03)
Troponin I: 2.43 ng/mL (ref <0.03)
Troponin I: 1.78 ng/mL (ref <0.03)

## 2016-05-13 LAB — I-STAT TROPONIN, ED: TROPONIN I, POC: 2.28 ng/mL — AB (ref 0.00–0.08)

## 2016-05-13 LAB — MAGNESIUM: Magnesium: 2 mg/dL (ref 1.7–2.4)

## 2016-05-13 MED ORDER — INSULIN DEGLUDEC 200 UNIT/ML ~~LOC~~ SOPN
160.0000 [IU] | PEN_INJECTOR | Freq: Every day | SUBCUTANEOUS | Status: DC
  Administered 2016-05-13 – 2016-05-15 (×3): 160 [IU] via SUBCUTANEOUS
  Filled 2016-05-13: qty 3

## 2016-05-13 MED ORDER — NITROGLYCERIN 0.4 MG SL SUBL: 0.4000 mg | SUBLINGUAL_TABLET | SUBLINGUAL | Status: DC | PRN

## 2016-05-13 MED ORDER — FUROSEMIDE 10 MG/ML IJ SOLN
40.0000 mg | Freq: Two times a day (BID) | INTRAMUSCULAR | Status: DC
  Administered 2016-05-13 – 2016-05-15 (×5): 40 mg via INTRAVENOUS
  Filled 2016-05-13 (×5): qty 4

## 2016-05-13 MED ORDER — INSULIN ASPART 100 UNIT/ML ~~LOC~~ SOLN
0.0000 [IU] | Freq: Three times a day (TID) | SUBCUTANEOUS | Status: DC
  Administered 2016-05-13 – 2016-05-14 (×4): 3 [IU] via SUBCUTANEOUS
  Administered 2016-05-14: 5 [IU] via SUBCUTANEOUS
  Administered 2016-05-14: 2 [IU] via SUBCUTANEOUS
  Filled 2016-05-13 (×2): qty 1

## 2016-05-13 MED ORDER — SODIUM CHLORIDE 0.9% FLUSH: 3.0000 mL | INTRAVENOUS | Status: DC | PRN

## 2016-05-13 MED ORDER — INSULIN ASPART 100 UNIT/ML FLEXPEN
0.0000 [IU] | PEN_INJECTOR | Freq: Three times a day (TID) | SUBCUTANEOUS | Status: DC | PRN
Start: 2016-05-13 — End: 2016-05-13

## 2016-05-13 MED ORDER — CARVEDILOL 6.25 MG PO TABS
6.2500 mg | ORAL_TABLET | Freq: Two times a day (BID) | ORAL | Status: DC
  Administered 2016-05-13 – 2016-05-15 (×5): 6.25 mg via ORAL
  Filled 2016-05-13 (×5): qty 1

## 2016-05-13 MED ORDER — SODIUM CHLORIDE 0.9% FLUSH
3.0000 mL | Freq: Two times a day (BID) | INTRAVENOUS | Status: DC
  Administered 2016-05-13 – 2016-05-15 (×5): 3 mL via INTRAVENOUS

## 2016-05-13 MED ORDER — ATORVASTATIN CALCIUM 80 MG PO TABS
80.0000 mg | ORAL_TABLET | Freq: Every day | ORAL | Status: DC
  Administered 2016-05-13 – 2016-05-14 (×2): 80 mg via ORAL
  Filled 2016-05-13 (×3): qty 1

## 2016-05-13 MED ORDER — GUAIFENESIN-DM 100-10 MG/5ML PO SYRP
5.0000 mL | ORAL_SOLUTION | ORAL | Status: DC | PRN
  Administered 2016-05-13 – 2016-05-14 (×3): 5 mL via ORAL
  Filled 2016-05-13 (×3): qty 5

## 2016-05-13 MED ORDER — SODIUM CHLORIDE 0.9 % IV SOLN: 250.0000 mL | INTRAVENOUS | Status: DC | PRN

## 2016-05-13 MED ORDER — ACETAMINOPHEN 325 MG PO TABS: 650.0000 mg | ORAL_TABLET | ORAL | Status: DC | PRN

## 2016-05-13 MED ORDER — ONDANSETRON HCL 4 MG/2ML IJ SOLN: 4.0000 mg | Freq: Four times a day (QID) | INTRAMUSCULAR | Status: DC | PRN

## 2016-05-13 MED ORDER — ASPIRIN 81 MG PO CHEW
81.0000 mg | CHEWABLE_TABLET | Freq: Every day | ORAL | Status: DC
  Administered 2016-05-13 – 2016-05-15 (×3): 81 mg via ORAL
  Filled 2016-05-13 (×3): qty 1

## 2016-05-13 MED ORDER — ISOSORBIDE MONONITRATE ER 30 MG PO TB24
30.0000 mg | ORAL_TABLET | Freq: Every day | ORAL | Status: DC
  Administered 2016-05-13 – 2016-05-15 (×3): 30 mg via ORAL
  Filled 2016-05-13 (×3): qty 1

## 2016-05-13 MED ORDER — TICAGRELOR 90 MG PO TABS
90.0000 mg | ORAL_TABLET | Freq: Two times a day (BID) | ORAL | Status: DC
  Administered 2016-05-13 – 2016-05-15 (×5): 90 mg via ORAL
  Filled 2016-05-13 (×5): qty 1

## 2016-05-13 MED ORDER — FUROSEMIDE 10 MG/ML IJ SOLN
40.0000 mg | INTRAMUSCULAR | Status: AC
  Administered 2016-05-13: 40 mg via INTRAVENOUS
  Filled 2016-05-13: qty 4

## 2016-05-13 MED ORDER — SPIRONOLACTONE 25 MG PO TABS
12.5000 mg | ORAL_TABLET | Freq: Every day | ORAL | Status: DC
  Administered 2016-05-13 – 2016-05-15 (×3): 12.5 mg via ORAL
  Filled 2016-05-13 (×3): qty 1

## 2016-05-13 MED ORDER — LOSARTAN POTASSIUM 50 MG PO TABS
100.0000 mg | ORAL_TABLET | Freq: Every day | ORAL | Status: DC
  Administered 2016-05-13 – 2016-05-15 (×3): 100 mg via ORAL
  Filled 2016-05-13 (×4): qty 2

## 2016-05-13 MED ORDER — POTASSIUM CHLORIDE CRYS ER 20 MEQ PO TBCR
20.0000 meq | EXTENDED_RELEASE_TABLET | Freq: Every day | ORAL | Status: DC
  Administered 2016-05-13 – 2016-05-15 (×3): 20 meq via ORAL
  Filled 2016-05-13 (×3): qty 1

## 2016-05-13 NOTE — ED Notes (Signed)
Low Sodium Diet was ordered for Lunch.

## 2016-05-13 NOTE — Care Management Note (Signed)
Case Management Note  Patient Details  Name: Douglas SermonsClifford M Carr MRN: [id number] Date of Birth: March 02, 1966  Subjective/Objective:                  From home with spouse. /50 yo pt recently discharged after being admitted for MI. Pt was told at discharge to return if unable to urinate or weight gain. Pt reports a 3lb weight gain with worsening shob. Pt denies chest pain, concerned for fluid overload. Pt's cardiologist is Dr.Berry, cards office referred pt to ED   Action/Plan: Follow for disposition needs. /Admit to OBSERVATION (acute on chronic systolic/diastolic) ; anticipate discharge HOME WITH HOME HEALTH.    Expected Discharge Date:  05/15/16               Expected Discharge Plan:  Home w Home Health Services  In-House Referral:  NA  Discharge planning Services  CM Consult  Post Acute Care Choice:    Choice offered to:     DME Arranged:    DME Agency:     HH Arranged:    HH Agency:     Status of Service:  In process, will continue to follow  If discussed at Long Length of Stay Meetings, dates discussed:    Additional Comments:  Oletta CohnWood, Deontray Hunnicutt, RN 05/13/2016, 9:36 AM

## 2016-05-13 NOTE — Progress Notes (Signed)
Patient Name: Douglas Carr Date of Encounter: 05/13/2016  Primary Cardiologist: Dr. Crista CurbBerry   Hospital Problem List     Active Problems:   Acute on chronic combined systolic and diastolic CHF (congestive heart failure) [facility](HCC)    Patient Profile     51 year old man with diffuse CAD (recently admitted from 1/3-1/7 for MI, had PCI of Om2), HFrEF (EF 35%), DMII (on insulin), HLD and carotid disease who presents with 8lb weight gain, worsening dyspnea and decreased UOP, despite full compliance with outpatient diuretics. Being admitted for acute on chronic systolic HF.   Subjective   Feels better. Breathing improved. His abdomen feels less distended. No chest pain.   Inpatient Medications    Scheduled Meds: . aspirin  81 mg Oral Daily  . atorvastatin  80 mg Oral q1800  . carvedilol  6.25 mg Oral BID WC  . insulin aspart  0-15 Units Subcutaneous TID WC  . Insulin Degludec  160 Units Subcutaneous Daily  . isosorbide mononitrate  30 mg Oral Daily  . losartan  100 mg Oral Daily  . potassium chloride SA  20 mEq Oral Daily  . sodium chloride flush  3 mL Intravenous Q12H  . spironolactone  12.5 mg Oral Daily  . ticagrelor  90 mg Oral BID   Continuous Infusions: . sodium chloride     PRN Meds: sodium chloride, acetaminophen, nitroGLYCERIN, ondansetron (ZOFRAN) IV, sodium chloride flush   Vital Signs    Vitals:   05/13/16 0500 05/13/16 0530 05/13/16 0657 05/13/16 0827  BP: 107/71 116/55 (!) 117/51 115/66  Pulse: 90 86 86 92  Resp: 18 15 13 15   Temp:    99.1 F (37.3 C)  TempSrc:    Oral  SpO2: 92% 97% 94% 98%  Weight:      Height:        Intake/Output Summary (Last 24 hours) at 05/13/16 1012 Last data filed at 05/13/16 0557  Gross per 24 hour  Intake                0 ml  Output             1275 ml  Net            -1275 ml   Filed Weights   05/12/16 2002  Weight: 246 lb (111.6 kg)    Physical Exam   GEN: Well nourished, well developed, in no acute distress.    HEENT: Grossly normal.  Neck: Supple, no JVD, carotid bruits, or masses. Cardiac: RRR, no murmurs, rubs, or gallops. No clubbing, cyanosis, edema.  Radials/DP/PT 2+ and equal bilaterally.  Respiratory:  Respirations regular and unlabored, clear to auscultation bilaterally. GI: Soft, nontender, nondistended, BS + x 4. MS: no deformity or atrophy. Skin: warm and dry, no rash. Neuro:  Strength and sensation are intact. Psych: AAOx3.  Normal affect.  Labs    CBC  Recent Labs  05/12/16 2003  WBC 9.4  HGB 12.3*  HCT 36.4*  MCV 84.3  PLT 212   Basic Metabolic Panel  Recent Labs  05/12/16 2003 05/13/16 0237  NA 135 136  K 4.8 4.3  CL 101 103  CO2 27 23  GLUCOSE 196* 176*  BUN 22* 25*  CREATININE 1.11 1.03  CALCIUM 8.7* 8.6*  MG  --  2.0   Liver Function Tests  Recent Labs  05/13/16 0237  AST 24  ALT 41  ALKPHOS 46  BILITOT 0.2*  PROT 6.2*  ALBUMIN 2.9*   No  results for input(s): LIPASE, AMYLASE in the last 72 hours. Cardiac Enzymes  Recent Labs  05/11/16 1046 05/13/16 0237 05/13/16 0906  TROPONINI 3.52* 2.43* 1.78*   BNP Invalid input(s): POCBNP D-Dimer No results for input(s): DDIMER in the last 72 hours. Hemoglobin A1C No results for input(s): HGBA1C in the last 72 hours. Fasting Lipid Panel No results for input(s): CHOL, HDL, LDLCALC, TRIG, CHOLHDL, LDLDIRECT in the last 72 hours. Thyroid Function Tests No results for input(s): TSH, T4TOTAL, T3FREE, THYROIDAB in the last 72 hours.  Invalid input(s): FREET3  Telemetry    NSR - Personally Reviewed  Radiology    Dg Chest 2 View  Result Date: 05/12/2016 CLINICAL DATA:  Recent myocardial infarction. 3 pound weight gain and worsening shortness of breath. Assess for fluid overload. EXAM: CHEST  2 VIEW COMPARISON:  Chest radiograph May 07, 2016 FINDINGS: Cardiomediastinal silhouette is normal. Diffuse interstitial prominence. Small pleural effusions and bibasilar strandy densities/atelectasis.  No pneumothorax. Calcified granulomas projecting RIGHT mid and lower lung zone. Soft tissue planes and included osseous structures are nonsuspicious. Calcifications superior to the bilateral humeral heads associated with calcific tendinopathy. IMPRESSION: Diffuse interstitial prominence which can be secondary to atypical infection or fluid overload with new small pleural effusions. Electronically Signed   By: Awilda Metro M.D.   On: 05/12/2016 20:32    Cardiac Studies   BNP    Component Value Date/Time   BNP 610.1 (H) 05/12/2016 2007    ProBNP    Component Value Date/Time   PROBNP <30.0 06/16/2008 0640   Cardiac Panel (last 3 results)  Recent Labs  05/11/16 1046 05/13/16 0237 05/13/16 0906  TROPONINI 3.52* 2.43* 1.78*   2D Echo - pending   Patient Profile     51 year old man with diffuse CAD (recently admitted from 1/3-1/7 for MI, had PCI of Om2), HFrEF (EF 35%), DMII (on insulin), HLD and carotid disease who presents with 8lb weight gain, worsening dyspnea and decreased UOP, despite full compliance with outpatient diuretics. Being admitted for acute on chronic systolic HF.   Assessment & Plan    1. acute on chronic systolic/diastolic: complaint with medication, no dietary changes or new chest pain.  - BNP on admit 610 (483 just 5 days ago). - Weight is up from 239 lb (recent discharge weight) to 246 lb today - Good UOP with initial dose of IV Lasix, -1.28 L out since fist dose of IV lasix early this am - SCr, K and BP all stable today - Continue IV Lasix for further diuresis back to previous dry weight of 239 lb. Continue 40 mg IV BID - Continue strict I/O and daily weights - low sodium diet - once euvolemic, consider changing PO diuretic from Lasix to Torsemide for better absorption (pt notes increase abdominal girth/distention with reduced response to PO Lasix at home). - continue BB as no evidence of cardiogenic shock/severe decompensate HF - repeat echo pending to  reassess LVEF  2.  CAD: s/p PCI to Om2. Residual severe diffuse native disease involving the proximal circumflex, LAD, and native right coronary. No chest pain. Troponin is abnormal at 1.78 but no increase from previous hospitalization. Suspect residual from recent MI. Trend: 4.9>>3.5>>2.43>>1.7 Do not suspect acute ISR. - continue Brilanta + ASA - continue Coreg 6.25mg  BID  - continue Spiro 12.5mg  and Losartan 100mg  and Imdur 30mg  - continue BB/ACEI/statin - f/u EKG  3.  DMII - SSI ordered  4. HLD  - on statin, Lipitor 80 mg nightly - recent lipid  panel 05/07/16 showed elevated LDL of 171 mg/dL. Goal given CAD and recent MI is < 70 mg/dL. - continue high intensity statin and plan on repeat FLP and HFTs in 6 weeks.   Signed, Robbie Lis, PA-C  05/13/2016, 10:12 AM   History and all data above reviewed.  Patient examined.  I agree with the findings as above. Breathing better.  No acute distress.   The patient exam reveals COR:RRR  ,  Lungs: Clear  ,  Abd: Positive bowel sounds, no rebound no guarding, Ext No edema.  All available labs, radiology testing, previous records reviewed. Agree with documented assessment and plan. Observe overnight for diuresis.  Probably home in AM.   Rollene Rotunda  12:23 PM  05/13/2016

## 2016-05-13 NOTE — Telephone Encounter (Signed)
TOC Phone call . Appt is on 05/15/16 w/ Corine ShelterLuke Kilroy . Thanks

## 2016-05-13 NOTE — ED Notes (Signed)
Breakfast tray ordered 

## 2016-05-13 NOTE — ED Notes (Signed)
Pt ambulating around nurses station. Steady gait.

## 2016-05-13 NOTE — ED Provider Notes (Signed)
[facility]-EMER[facility]ENCY DEPT Provider Note   CSN: [id number] Arrival date & time: 05/12/16  1949    History   Chief Complaint Chief Complaint  Patient presents with  . Shortness of Breath    HPI Douglas Carr is a 51 y.o. male.  The patient is a 51 y/o male with hx of CAD by cardiac cath in 2010. S/p right CEA in 2010. He is also s/p STEMI on 05/08/15 involving left circumflex coronary artery. He underwent stenting of the second obtuse marginal and had residual severe diffuse native disease involving the proximal circumflex, LAD, and native right coronary. This is currently being treated medically. LVEF during admission was 30-35%. During admission, patient mostly with c/o dyspnea. He presents tonight for the same. He reports decreased urinary output despite 40mg  Lasix BID. He correlates this with a 6-8 pound weight gain in 24 hours. He feels as though his dyspnea has worsened since discharge on 05/11/16. He has not noted any fevers or new/worsening leg swelling. No c/o chest pain. No abdominal pain, N/V. He was told at hospital discharge to monitor his weight and to be cautious for any gain over 3 pounds.  Cardiologist - Dr. Allyson SabalBerry. He was primarily followed by Dr. Katrinka BlazingSmith during his recent admission.   The history is provided by the patient.  Shortness of Breath     Past Medical History:  Diagnosis Date  . Anginal pain (HCC)    secondary to sm. vessel disease  . Anxiety   . Arthritis    BIL KNEE PAIN AND BIL ANKLE PAIN  . Bone spur of ankle   . Coronary artery disease    Remote MI at 51 years of age, Last cath 2010-diffuse non-obstructive disease; last echo 06/16/08 -normal LV function, moderate concentric hypertrophy; Last nuc 08/2008 no ischemia;  medical therapy  . Diabetes mellitus    ON ORAL MEDICATION AND INSULIN  . Hyperlipidemia   . Hypertension   . Myocardial infarction 1996   Post MI  . Peripheral vascular disease (HCC)    HAS LEFT CAROTID ARTERY STENOSIS   AND IS S/P RIGHT  CAROTID ENDARTERECTOMY 2010 Last carotid dopplers 01/08/2012 wth patent endarterectomy site    Patient Active Problem List   Diagnosis Date Noted  . Status post coronary artery stent placement   . Acute on chronic systolic heart failure (HCC)   . Acute ST elevation myocardial infarction (STEMI) involving left circumflex coronary artery (HCC) 05/07/2016  . Hypertension 09/21/2013  . Diabetes mellitus (HCC) 09/21/2013  . Combined hyperlipidemia associated with type 2 diabetes mellitus 09/21/2013  . Pulmonary nodules 10/16/2011    Past Surgical History:  Procedure Laterality Date  . APPENDECTOMY    . CARDIAC CATHETERIZATION     FEB 2010, significant branch vessel disease wth diag, marginal, PDA & PLA, nml. LV function  . CARDIAC CATHETERIZATION N/A 05/07/2016   Procedure: Left Heart Cath and Coronary Angiography;  Surgeon: Peter M SwazilandJordan, MD;  [facility];  Service: Cardiovascular;  Laterality: N/A;  . CARDIAC CATHETERIZATION N/A 05/07/2016   Procedure: Coronary Stent Intervention;  Surgeon: Peter M SwazilandJordan, MD;  [facility];  Service: Cardiovascular;  Laterality: N/A;  . CAROTID ENDARTERECTOMY  09/2008   Rt CEA  . LUNG BIOPSY  2013   Bx's suggest granulomatous dz.  . RT ANKLE   2013       Home Medications    Prior to Admission medications   Medication Sig Start Date End Date Taking? Authorizing  Provider  acetaminophen (TYLENOL) 325 MG tablet Take 2 tablets (650 mg total) by mouth every 4 (four) hours as needed for headache or mild pain. 05/11/16   Abelino Derrick, PA-C  aspirin 81 MG chewable tablet Chew 1 tablet (81 mg total) by mouth daily. 05/12/16   Abelino Derrick, PA-C  atorvastatin (LIPITOR) 80 MG tablet Take 1 tablet (80 mg total) by mouth daily at 6 PM. 05/11/16   Abelino Derrick, PA-C  carvedilol (COREG) 6.25 MG tablet Take 1 tablet (6.25 mg total) by mouth 2 (two) times daily with a meal. 05/11/16   Abelino Derrick, PA-C  furosemide (LASIX) 40 MG tablet  Take 1 tablet (40 mg total) by mouth 2 (two) times daily. 05/11/16   Abelino Derrick, PA-C  insulin aspart (NOVOLOG FLEXPEN) 100 UNIT/ML FlexPen Inject 6 to 10 units 3 times a day prior to each meal and as needed for blood sugar greater than 200 11/14/15   [name], MD  Insulin Degludec (TRESIBA FLEXTOUCH) 200 UNIT/ML SOPN Inject 160 Units into the skin daily. 11/15/15   [name], MD  isosorbide mononitrate (IMDUR) 30 MG 24 hr tablet Take 1 tablet (30 mg total) by mouth daily. 05/12/16   Abelino Derrick, PA-C  losartan (COZAAR) 100 MG tablet Take 1 tablet (100 mg total) by mouth daily. 05/12/16   Abelino Derrick, PA-C  nitroGLYCERIN (NITROSTAT) 0.4 MG SL tablet Place 1 tablet (0.4 mg total) under the tongue every 5 (five) minutes x 3 doses as needed for chest pain. 05/11/16   Abelino Derrick, PA-C  potassium chloride SA (K-DUR,KLOR-CON) 20 MEQ tablet Take 1 tablet (20 mEq total) by mouth daily. 05/12/16   Abelino Derrick, PA-C  spironolactone (ALDACTONE) 25 MG tablet Take 0.5 tablets (12.5 mg total) by mouth daily. 05/12/16   Abelino Derrick, PA-C  ticagrelor (BRILINTA) 90 MG TABS tablet Take 1 tablet (90 mg total) by mouth 2 (two) times daily. 05/11/16   Abelino Derrick, PA-C    Family History Family History  Problem Relation Age of Onset  . Hypertension Mother   . Diabetes Mother   . Hypertension Maternal Grandfather   . Heart attack Paternal Grandfather   . Heart attack Father 33    Social History Social History  Substance Use Topics  . Smoking status: Former Smoker    Packs/day: 2.00    Years: 22.00    Types: Cigarettes    Quit date: 10/23/2011  . Smokeless tobacco: Never Used  . Alcohol use 0.0 oz/week    2 - 3 Cans of beer per week     Comment: occasional     Allergies   Patient has no known allergies.   Review of Systems Review of Systems  Respiratory: Positive for shortness of breath.   Ten systems reviewed and are negative for acute change, except as noted in the HPI.      Physical Exam Updated Vital Signs BP 117/69   Pulse 96   Temp 98.3 F (36.8 C)   Resp (!) 27   Ht 5\' 9"  (1.753 m)   Wt 111.6 kg   SpO2 96%   BMI 36.33 kg/m   Physical Exam  Constitutional: He is oriented to person, place, and time. He appears well-developed and well-nourished. No distress.  Nontoxic and in NAD  HENT:  Head: Normocephalic and atraumatic.  Eyes: Conjunctivae and EOM are normal. No scleral icterus.  Neck: Normal range of motion.  Cardiovascular:  Normal rate, regular rhythm and intact distal pulses.   Pulmonary/Chest: Effort normal. No respiratory distress. He has no wheezes. He has no rales.  Decreased BS in b/l bases. No tachypnea or dyspnea on exam. Chest expansion symmetric.  Abdominal: Soft. He exhibits no mass. There is no tenderness. There is no guarding.  Soft, obese, nontender abdomen.  Musculoskeletal: Normal range of motion.  No BLE pitting edema.  Neurological: He is alert and oriented to person, place, and time. He exhibits normal muscle tone. Coordination normal.  GCS 15. Patient moving all extremities.  Skin: Skin is warm and dry. No rash noted. He is not diaphoretic. No erythema. No pallor.  Psychiatric: He has a normal mood and affect. His behavior is normal.  Nursing note and vitals reviewed.    ED Treatments / Results  Labs (all labs ordered are listed, but only abnormal results are displayed) Labs Reviewed  BASIC METABOLIC PANEL - Abnormal; Notable for the following:       Result Value   Glucose, Bld 196 (*)    BUN 22 (*)    Calcium 8.7 (*)    All other components within normal limits  CBC - Abnormal; Notable for the following:    Hemoglobin 12.3 (*)    HCT 36.4 (*)    All other components within normal limits  BRAIN NATRIURETIC PEPTIDE - Abnormal; Notable for the following:    B Natriuretic Peptide 610.1 (*)    All other components within normal limits  I-STAT TROPOININ, ED - Abnormal; Notable for the following:     Troponin i, poc 2.25 (*)    All other components within normal limits  I-STAT TROPOININ, ED    EKG  EKG Interpretation  Date/Time:  Monday May 12 2016 20:01:02 EST Ventricular Rate:  93 PR Interval:  172 QRS Duration: 104 QT Interval:  344 QTC Calculation: 427 R Axis:   60 Text Interpretation:  Normal sinus rhythm Septal infarct , age undetermined Marked ST abnormality, possible inferior subendocardial injury Abnormal ECG similar ST changes, more pronounced in inferior leads Confirmed by [facility] MD, JASON [id number]) on 05/12/2016 8:07:17 PM       Radiology Dg Chest 2 View  Result Date: 05/12/2016 CLINICAL DATA:  Recent myocardial infarction. 3 pound weight gain and worsening shortness of breath. Assess for fluid overload. EXAM: CHEST  2 VIEW COMPARISON:  Chest radiograph May 07, 2016 FINDINGS: Cardiomediastinal silhouette is normal. Diffuse interstitial prominence. Small pleural effusions and bibasilar strandy densities/atelectasis. No pneumothorax. Calcified granulomas projecting RIGHT mid and lower lung zone. Soft tissue planes and included osseous structures are nonsuspicious. Calcifications superior to the bilateral humeral heads associated with calcific tendinopathy. IMPRESSION: Diffuse interstitial prominence which can be secondary to atypical infection or fluid overload with new small pleural effusions. Electronically Signed   By: Awilda Metro M.D.   On: 05/12/2016 20:32    Procedures Procedures (including critical care time)  Medications Ordered in ED Medications  furosemide (LASIX) injection 40 mg (not administered)     Initial Impression / Assessment and Plan / ED Course  I have reviewed the triage vital signs and the nursing notes.  Pertinent labs & imaging results that were available during my care of the patient were reviewed by me and considered in my medical decision making (see chart for details).  Clinical Course     1:10 AM Case discussed with  cardiology who will evaluate the patient in the ED. Anticipate admission to the cardiology service.   Final Clinical  Impressions(s) / ED Diagnoses   Final diagnoses:  Acute combined systolic and diastolic congestive heart failure [facility])    New Prescriptions New Prescriptions   No medications on file     Antony Madura, PA-C 05/13/16 1610    Gilda Crease, MD 05/13/16 [phone]

## 2016-05-13 NOTE — H&P (Signed)
Patient ID: Douglas Carr MRN: [id number], DOB/AGE: 08/10/1965   Admit date: 05/12/2016   Primary Physician: [name], MD Primary Cardiologist: Dr. Allyson Sabal  Pt. Profile: 51 year old man with diffuse CAD (recently admitted from 1/3-1/7 for MI, had PCI of Om2), HFrEF (EF 35%), DMII (on insulin), HLD and carotid disease who presents with 8lb weight gain and worsening dyspnea.   Since discharge, he reports compliance with all medications including DAPT and diuretics (lasix/spiro). However, over the last day he has noticed increasing abdominal swelling, some increasing dyspnea and weight gain with decreased UOP.   Denies any chest pain. No sick contacts or dietary indiscretions.  Problem List  Past Medical History:  Diagnosis Date  . Anginal pain (HCC)    secondary to sm. vessel disease  . Anxiety   . Arthritis    BIL KNEE PAIN AND BIL ANKLE PAIN  . Bone spur of ankle   . Coronary artery disease    Remote MI at 51 years of age, Last cath 2010-diffuse non-obstructive disease; last echo 06/16/08 -normal LV function, moderate concentric hypertrophy; Last nuc 08/2008 no ischemia;  medical therapy  . Diabetes mellitus    ON ORAL MEDICATION AND INSULIN  . Hyperlipidemia   . Hypertension   . Myocardial infarction 1996   Post MI  . Peripheral vascular disease (HCC)    HAS LEFT CAROTID ARTERY STENOSIS   AND IS S/P RIGHT CAROTID ENDARTERECTOMY 2010 Last carotid dopplers 01/08/2012 wth patent endarterectomy site    Past Surgical History:  Procedure Laterality Date  . APPENDECTOMY    . CARDIAC CATHETERIZATION     FEB 2010, significant branch vessel disease wth diag, marginal, PDA & PLA, nml. LV function  . CARDIAC CATHETERIZATION N/A 05/07/2016   Procedure: Left Heart Cath and Coronary Angiography;  Surgeon: Peter M Swaziland, MD;  Location: [facility] INVASIVE CV LAB;  Service: Cardiovascular;  Laterality: N/A;  . CARDIAC CATHETERIZATION N/A 05/07/2016   Procedure: Coronary Stent  Intervention;  Surgeon: Peter M Swaziland, MD;  Location: [facility] INVASIVE CV LAB;  Service: Cardiovascular;  Laterality: N/A;  . CAROTID ENDARTERECTOMY  09/2008   Rt CEA  . LUNG BIOPSY  2013   Bx's suggest granulomatous dz.  . RT ANKLE   2013     Allergies  No Known Allergies  Home Medications  Prior to Admission medications   Medication Sig Start Date End Date Taking? Authorizing Provider  acetaminophen (TYLENOL) 325 MG tablet Take 2 tablets (650 mg total) by mouth every 4 (four) hours as needed for headache or mild pain. 05/11/16   Abelino Derrick, PA-C  aspirin 81 MG chewable tablet Chew 1 tablet (81 mg total) by mouth daily. 05/12/16   Abelino Derrick, PA-C  atorvastatin (LIPITOR) 80 MG tablet Take 1 tablet (80 mg total) by mouth daily at 6 PM. 05/11/16   Abelino Derrick, PA-C  carvedilol (COREG) 6.25 MG tablet Take 1 tablet (6.25 mg total) by mouth 2 (two) times daily with a meal. 05/11/16   Abelino Derrick, PA-C  furosemide (LASIX) 40 MG tablet Take 1 tablet (40 mg total) by mouth 2 (two) times daily. 05/11/16   Abelino Derrick, PA-C  insulin aspart (NOVOLOG FLEXPEN) 100 UNIT/ML FlexPen Inject 6 to 10 units 3 times a day prior to each meal and as needed for blood sugar greater than 200 11/14/15   [name], MD  Insulin Degludec (TRESIBA FLEXTOUCH) 200 UNIT/ML SOPN Inject 160 Units into the skin daily. 11/15/15  Frederica KusterStephen M Miller, MD  isosorbide mononitrate (IMDUR) 30 MG 24 hr tablet Take 1 tablet (30 mg total) by mouth daily. 05/12/16   Abelino DerrickLuke K Kilroy, PA-C  losartan (COZAAR) 100 MG tablet Take 1 tablet (100 mg total) by mouth daily. 05/12/16   Abelino DerrickLuke K Kilroy, PA-C  nitroGLYCERIN (NITROSTAT) 0.4 MG SL tablet Place 1 tablet (0.4 mg total) under the tongue every 5 (five) minutes x 3 doses as needed for chest pain. 05/11/16   Abelino DerrickLuke K Kilroy, PA-C  potassium chloride SA (K-DUR,KLOR-CON) 20 MEQ tablet Take 1 tablet (20 mEq total) by mouth daily. 05/12/16   Abelino DerrickLuke K Kilroy, PA-C  spironolactone (ALDACTONE) 25 MG tablet Take  0.5 tablets (12.5 mg total) by mouth daily. 05/12/16   Abelino DerrickLuke K Kilroy, PA-C  ticagrelor (BRILINTA) 90 MG TABS tablet Take 1 tablet (90 mg total) by mouth 2 (two) times daily. 05/11/16   Abelino DerrickLuke K Kilroy, PA-C    Family History  Family History  Problem Relation Age of Onset  . Hypertension Mother   . Diabetes Mother   . Hypertension Maternal Grandfather   . Heart attack Paternal Grandfather   . Heart attack Father [id number]    Social History  Social History   Social History  . Marital status: Married    Spouse name: [name]  . Number of children: 3  . Years of education: N/A   Occupational History  . 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 Smoking status: Former Smoker    Packs/day: 2.00    Years: 22.00    Types: Cigarettes    Quit date: 10/23/2011  . Smokeless tobacco: Never Used  . Alcohol use 0.0 oz/week    2 - 3 Cans of beer per week     Comment: occasional  . Drug use: No  . Sexual activity: Not on file   Other Topics Concern  . Not on file   Social History Narrative  . No narrative on file     Review of Systems General:  No chills, fever, night sweats or weight changes.  Cardiovascular:  No chest pain, +dyspnea on exertion, no edema, orthopnea, palpitations, paroxysmal nocturnal dyspnea. Dermatological: No rash, lesions/masses Respiratory: No cough, +dyspnea Urologic: No hematuria, dysuria Abdominal:   No nausea, vomiting, diarrhea, bright red blood per rectum, melena, or hematemesis Neurologic:  No visual changes, wkns, changes in mental status. All other systems reviewed and are otherwise negative except as noted above.  Physical Exam  Blood pressure 119/69, pulse 96, temperature 98.3 F (36.8 C), resp. rate 20, height 5\' 9"  (1.753 m), weight 111.6 kg (246 lb), SpO2 96 %.  General: Pleasant, NAD Psych: Normal affect. Neuro: Alert and oriented X 3. Moves all extremities spontaneously. HEENT: Normal  Neck: JVP to midneck Lungs:  Resp regular and  unlabored, CTA. Heart: RRR no s3, s4, or murmurs. Abdomen: Soft, non-tender, non-distended, BS + x 4.  Extremities: No clubbing, cyanosis or edema. DP/PT/Radials 2+ and equal bilaterally.  Labs  Troponin [facility](Point of Care Test)  Recent Labs  05/12/16 2035  TROPIPOC 2.25*    Recent Labs  05/10/16 1426 05/10/16 2000 05/11/16 0210 05/11/16 1046  TROPONINI 5.93* 5.73* 4.93* 3.52*   Lab Results  Component Value Date   WBC 9.4 05/12/2016   HGB 12.3 (L) 05/12/2016   HCT 36.4 (L) 05/12/2016   MCV 84.3 05/12/2016   PLT 212 05/12/2016    Recent Labs Lab 05/07/16 0315  05/12/16 2003  NA  135  < > 135  K 4.2  < > 4.8  CL 103  < > 101  CO2 21*  < > 27  BUN 28*  < > 22*  CREATININE 1.17  < > 1.11  CALCIUM 8.7*  < > 8.7*  PROT 6.6  --   --   BILITOT 0.7  --   --   ALKPHOS 42  --   --   ALT 43  --   --   AST 69*  --   --   GLUCOSE 287*  < > 196*  < > = values in this interval not displayed. Lab Results  Component Value Date   CHOL 245 (H) 05/07/2016   HDL 37 (L) 05/07/2016   LDLCALC 171 (H) 05/07/2016   TRIG 183 (H) 05/07/2016   Lab Results  Component Value Date   DDIMER  06/16/2008    0.26        AT THE INHOUSE ESTABLISHED CUTOFF VALUE OF 0.48 ug/mL FEU, THIS ASSAY HAS BEEN DOCUMENTED IN THE LITERATURE TO HAVE A SENSITIVITY AND NEGATIVE PREDICTIVE VALUE OF AT LEAST 98 TO 99%.  THE TEST RESULT SHOULD BE CORRELATED WITH AN ASSESSMENT OF THE CLINICAL PROBABILITY OF DVT / VTE.     Radiology/Studies  Dg Chest 2 View  Result Date: 05/12/2016 CLINICAL DATA:  Recent myocardial infarction. 3 pound weight gain and worsening shortness of breath. Assess for fluid overload. EXAM: CHEST  2 VIEW COMPARISON:  Chest radiograph May 07, 2016 FINDINGS: Cardiomediastinal silhouette is normal. Diffuse interstitial prominence. Small pleural effusions and bibasilar strandy densities/atelectasis. No pneumothorax. Calcified granulomas projecting RIGHT mid and lower lung zone. Soft  tissue planes and included osseous structures are nonsuspicious. Calcifications superior to the bilateral humeral heads associated with calcific tendinopathy. IMPRESSION: Diffuse interstitial prominence which can be secondary to atypical infection or fluid overload with new small pleural effusions. Electronically Signed   By: Awilda Metro M.D.   On: 05/12/2016 20:32   Dg Chest 2 View  Result Date: 05/07/2016 CLINICAL DATA:  Chest pain and dyspnea for 2 days. EXAM: CHEST  2 VIEW COMPARISON:  01/06/2012, 04/12/2012. FINDINGS: Calcified granulomatous changes are again evident, stable. There is diffuse interstitial thickening which may represent interstitial edema. This is new. No confluent airspace consolidation. No large effusions. Hilar and mediastinal contours are unremarkable and unchanged. IMPRESSION: New interstitial thickening. This may represent interstitial edema although a component of fibrosis is not excluded. Electronically Signed   By: Ellery Plunk M.D.   On: 05/07/2016 00:24    ECG Pending  Echocardiogram  05/08/2016 - LVEF 30-35%, anterior, mid to distal anteroseptal, apical and   inferoapical akinesis suggestive of ischemia/infarct, mild LVH,   normal biatrial size, aneurysmal interatrial septum - small PFO   cannot be excluded, dilated IVC.    =========================================== ASSESSMENT AND PLAN 51 year old man with diffuse CAD (recently admitted from 1/3-1/7 for MI, had PCI of Om2), HFrEF (EF 35%), DMII (on insulin), HLD and carotid disease who presents with 8lb weight gain and worsening dyspnea.    # acute on chronic systolic/diastolic: complaint with medication, no dietary changes or new chest pain.  - lasix 40mg  IV x 1 - -assess response, and likely will need 1-2 more IV doses - repeat TTE to assess EF - continue BB as no evidence of cardiogenic shock/severe decompensate HF - likely will need uptitration of home diuretics doses or if echo shows new WMA  perhaps may need repeat LHC especially if develops chest pain  #  CAD: s/p PCI to Om2. Residual severe diffuse native disease involving the proximal circumflex, LAD, and native right coronary. No chest pain and unlikely for a instent thrombosis. - continue Brilanta + ASA - continue Coreg 6.25mg  BID  - continue Spiro 12.5mg  and Losartan 100mg  and Imdur 30mg  - continue BB/ACEI/statin - f/u EKG  # DMII - ISS and home insulin  # HLD  - statin  FULL CODE  Signed, Yehuda [name], MD

## 2016-05-13 NOTE — Telephone Encounter (Signed)
TOC-[facility]

## 2016-05-14 ENCOUNTER — Inpatient Hospital Stay (HOSPITAL_COMMUNITY): Payer: BC Managed Care – PPO

## 2016-05-14 LAB — GLUCOSE, CAPILLARY
GLUCOSE-CAPILLARY: 243 mg/dL — AB (ref 65–99)
Glucose-Capillary: 141 mg/dL — ABNORMAL HIGH (ref 65–99)
Glucose-Capillary: 199 mg/dL — ABNORMAL HIGH (ref 65–99)
Glucose-Capillary: 238 mg/dL — ABNORMAL HIGH (ref 65–99)

## 2016-05-14 MED ORDER — ALPRAZOLAM 0.5 MG PO TABS
0.5000 mg | ORAL_TABLET | Freq: Every evening | ORAL | Status: DC | PRN
  Administered 2016-05-14: 0.5 mg via ORAL
  Filled 2016-05-14: qty 1

## 2016-05-14 MED ORDER — INSULIN ASPART 100 UNIT/ML ~~LOC~~ SOLN
0.0000 [IU] | Freq: Three times a day (TID) | SUBCUTANEOUS | Status: DC
  Administered 2016-05-14: 5 [IU] via SUBCUTANEOUS
  Administered 2016-05-15 (×2): 2 [IU] via SUBCUTANEOUS

## 2016-05-14 NOTE — Progress Notes (Signed)
Patient Name: Douglas Carr Date of Encounter: 05/14/2016  Primary Cardiologist: Dr. Crista Curb Problem List     Active Problems:   Acute on chronic combined systolic and diastolic CHF (congestive heart failure) Douglas Carr)     Subjective   Douglas Carr is not feeling much better than on admission. He felt better yesterday morning but reverted to his previous complaints. He denies chest pain but continues to have DOE, abdominal fullness, orthopnea and coughing while lying down. He does not think that he is ready to go back home.  Inpatient Medications    Scheduled Meds: . aspirin  81 mg Oral Daily  . atorvastatin  80 mg Oral q1800  . carvedilol  6.25 mg Oral BID WC  . furosemide  40 mg Intravenous BID  . insulin aspart  0-15 Units Subcutaneous TID WC  . Insulin Degludec  160 Units Subcutaneous Daily  . isosorbide mononitrate  30 mg Oral Daily  . losartan  100 mg Oral Daily  . potassium chloride SA  20 mEq Oral Daily  . sodium chloride flush  3 mL Intravenous Q12H  . spironolactone  12.5 mg Oral Daily  . ticagrelor  90 mg Oral BID   Continuous Infusions:  PRN Meds: sodium chloride, acetaminophen, guaiFENesin-dextromethorphan, nitroGLYCERIN, ondansetron (ZOFRAN) IV, sodium chloride flush   Vital Signs    Vitals:   05/13/16 2030 05/13/16 2050 05/14/16 0008 05/14/16 0537  BP:  (!) 107/52 121/61 107/64  Pulse: 91 91 86 88  Resp:  18 18 18   Temp:  97.7 F (36.5 C) 97.8 F (36.6 C) 97.9 F (36.6 C)  TempSrc:  Oral Oral Oral  SpO2: 97% 96% 96% 96%  Weight:    239 lb 4.8 oz (108.5 kg)  Height:        Intake/Output Summary (Last 24 hours) at 05/14/16 1158 Last data filed at 05/14/16 0539  Gross per 24 hour  Intake              363 ml  Output             1650 ml  Net            -1287 ml   Filed Weights   05/12/16 2002 05/13/16 1728 05/14/16 0537  Weight: 246 lb (111.6 kg) 241 lb 12.8 oz (109.7 kg) 239 lb 4.8 oz (108.5 kg)    Physical Exam    GEN:  Pleasant caucasian male, in no acute distress.  HEENT: Grossly normal.  Neck: Supple, no JVD, carotid bruits, or masses. Cardiac: RRR, no murmurs, rubs, or gallops. No clubbing, cyanosis, edema.  Radials/DP/PT 2+ and equal bilaterally.  Respiratory:  Respirations regular and unlabored, clear to auscultation bilaterally. GI: Soft, nontender, round, BS + x 4. MS: no deformity or atrophy. Skin: warm and dry, no rash. Neuro:  Strength and sensation are intact. Psych: AAOx3.  Normal affect.  Labs    CBC  Recent Labs  05/12/16 2003  WBC 9.4  HGB 12.3*  HCT 36.4*  MCV 84.3  PLT 212   Basic Metabolic Panel  Recent Labs  05/12/16 2003 05/13/16 0237  NA 135 136  K 4.8 4.3  CL 101 103  CO2 27 23  GLUCOSE 196* 176*  BUN 22* 25*  CREATININE 1.11 1.03  CALCIUM 8.7* 8.6*  MG  --  2.0   Liver Function Tests  Recent Labs  05/13/16 0237  AST 24  ALT 41  ALKPHOS 46  BILITOT 0.2*  PROT 6.2*  ALBUMIN 2.9*   No results for input(s): LIPASE, AMYLASE in the last 72 hours. Cardiac Enzymes  Recent Labs  05/13/16 0237 05/13/16 0906 05/13/16 1544  TROPONINI 2.43* 1.78* 2.22*   BNP Invalid input(s): POCBNP D-Dimer No results for input(s): DDIMER in the last 72 hours. Hemoglobin A1C No results for input(s): HGBA1C in the last 72 hours. Fasting Lipid Panel No results for input(s): CHOL, HDL, LDLCALC, TRIG, CHOLHDL, LDLDIRECT in the last 72 hours. Thyroid Function Tests No results for input(s): TSH, T4TOTAL, T3FREE, THYROIDAB in the last 72 hours.  Invalid input(s): FREET3  Telemetry    Sinus rhythm with rates in the 80s to 90s - Personally Reviewed  ECG    No new tracings  Radiology    Dg Chest 2 View  Result Date: 05/12/2016 CLINICAL DATA:  Recent myocardial infarction. 3 pound weight gain and worsening shortness of breath. Assess for fluid overload. EXAM: CHEST  2 VIEW COMPARISON:  Chest radiograph May 07, 2016 FINDINGS: Cardiomediastinal silhouette is  normal. Diffuse interstitial prominence. Small pleural effusions and bibasilar strandy densities/atelectasis. No pneumothorax. Calcified granulomas projecting RIGHT mid and lower lung zone. Soft tissue planes and included osseous structures are nonsuspicious. Calcifications superior to the bilateral humeral heads associated with calcific tendinopathy. IMPRESSION: Diffuse interstitial prominence which can be secondary to atypical infection or fluid overload with new small pleural effusions. Electronically Signed   By: Awilda Metro M.D.   On: 05/12/2016 20:32    Cardiac Studies   Echocardiogram pending  Patient Profile     51 year old man with diffuse CAD (recently admitted from 1/3-1/41for MI, had PCI of Om2), HFrEF (EF 35%), DMII (on insulin), HLD and carotid disease who presents with 8lb weight gain, worsening dyspnea and decreased UOP, despite full compliance with outpatient diuretics. Being admitted for acute on chronic systolic HF.   Assessment & Plan    1. Acute on chronic combined systolic and diastolic congestive heart failure -BNP on admit 610 (483 just 5 days ago). -Weight was up from 239 lb (recent discharge weight) to 246 lb at presentation 1/8 pm and back to 239 lbs today -Chest x-ray indicated diffuse interstitial prominence which can be secondary to atypical infection or fluid overload with new small pleural effusions - Good UOP with IV Lasix, complete output not documented -SCr, K and BP all stable on admission, will check BMP in a.m. -Home Lasix dosing was 40 mg by mouth twice a day has been receiving Lasix 40 mg IV twice a day and plan to continue for another day as patient still has symptoms of dyspnea on exertion, orthopnea and abdominal fullness. Continue spironolactone. -Blood pressure is stable with readings 106-121/60s -Restricted I/O and daily weights -Reviewed low-sodium diet with patient and his wife -Once euvolemic, Lantus discharge patient home on torsemide  instead of furosemide for better absorption. Discussed with patient and his wife. -Continue beta blocker is no evidence of cardiogenic shock/severe decompensated heart failure -Repeat echocardiogram is pending to reassess LV function   2. CAD -Status post PCI to OM 2. Residual severe diffuse native disease involving the proximal circumflex, LAD and right coronary artery. No chest pain. Troponin is abnormal at 1.78 but no increase from previous hospitalization suspect residual from recent MI- 4.9>>3.5>>2.43>>1.7.  Current trend 2.25, 2.28, 2.43, 1.78, 2.22 -Continue Brilinta and aspirin -continue Coreg 6.25 mg twice a day -continue spironolactone 12.5 mg and losartan 100 mg and Imdur 30 mg  3. Diabetes type 2  -CBGs and SSI ordered  4. Hyperlipidemia -On  statin, Lipitor 80 mg nightly -Recent lipid panel on 05/07/2016 showed elevated LDL of 1 71 mg/dL. Goal given CAD and recent MI is less than 70 mg/dL -Continue high intensity statin and plan on repeat fasting lipid panel and HFT's in 6 weeks   Signed, Berton BonJanine Hammond, NP  05/14/2016, 11:58 AM  Pager:  [phone]  History and all data above reviewed.  Patient examined.  I agree with the findings as above.  He has dyspnea that predates the admission on the 3rd (before Brilinta) .  Still not breathing at baseline.   The patient exam reveals COR:RRR  ,  Lungs: Clear  ,  Abd: Positive bowel sounds, no rebound no guarding, Ext:  Trace edema  .  All available labs, radiology testing, previous records reviewed. Agree with documented assessment and plan. Will give IV diuresis again tonight and change to Torsemide in the AM.   Rollene RotundaJames Tige Meas  3:38 PM  05/14/2016 +

## 2016-05-15 ENCOUNTER — Inpatient Hospital Stay (HOSPITAL_COMMUNITY): Payer: BC Managed Care – PPO

## 2016-05-15 ENCOUNTER — Ambulatory Visit: Payer: BC Managed Care – PPO | Admitting: Cardiology

## 2016-05-15 DIAGNOSIS — I429 Cardiomyopathy, unspecified: Secondary | ICD-10-CM

## 2016-05-15 LAB — BASIC METABOLIC PANEL
Anion gap: 9 (ref 5–15)
BUN: 23 mg/dL — AB (ref 6–20)
CHLORIDE: 101 mmol/L (ref 101–111)
CO2: 27 mmol/L (ref 22–32)
CREATININE: 1.07 mg/dL (ref 0.61–1.24)
Calcium: 8.5 mg/dL — ABNORMAL LOW (ref 8.9–10.3)
GFR calc Af Amer: >60 mL/min (ref >60)
GLUCOSE: 162 mg/dL — AB (ref 65–99)
Potassium: 4.1 mmol/L (ref 3.5–5.1)
Sodium: 137 mmol/L (ref 135–145)

## 2016-05-15 LAB — ECHOCARDIOGRAM COMPLETE
AOASC: 25 cm
CHL CUP DOP CALC LVOT VTI: 10.9 cm
CHL CUP STROKE VOLUME: 58 mL
E decel time: 204 msec
EERAT: 12.9
FS: 19 % — AB (ref 28–44)
Height: 69 in
IV/PV OW: 1.07
LA ID, A-P, ES: 40 mm
LA diam index: 1.71 cm/m2
LA vol A4C: 51.1 ml
LAVOL: 53.4 mL
LAVOLIN: 22.8 mL/m2
LDCA: 3.8 cm2
LEFT ATRIUM END SYS DIAM: 40 mm
LV E/e'average: 12.9
LV PW d: 10.5 mm — AB (ref 0.6–1.1)
LV SIMPSON'S DISK: 32
LV dias vol: 181 mL — AB (ref 62–150)
LV e' LATERAL: 6.31 cm/s
LV sys vol index: 52 mL/m2
LVDIAVOLIN: 77 mL/m2
LVEEMED: 12.9
LVOT diameter: 22 mm
LVOTPV: 63.7 cm/s
LVOTSV: 41 mL
LVSYSVOL: 123 mL — AB (ref 21–61)
MV Dec: 204
MV pk A vel: 44.7 m/s
MV pk E vel: 81.4 m/s
MVPG: 3 mmHg
RV LATERAL S' VELOCITY: 12.6 cm/s
TAPSE: 18.1 mm
TDI e' lateral: 6.31
TDI e' medial: 7.4
Weight: 3840 oz

## 2016-05-15 LAB — GLUCOSE, CAPILLARY
GLUCOSE-CAPILLARY: 132 mg/dL — AB (ref 65–99)
GLUCOSE-CAPILLARY: 156 mg/dL — AB (ref 65–99)

## 2016-05-15 MED ORDER — TORSEMIDE 20 MG PO TABS: 20.0000 mg | ORAL_TABLET | Freq: Two times a day (BID) | ORAL | 3 refills | Status: DC

## 2016-05-15 NOTE — Progress Notes (Signed)
Patient discharged with belongings and insulin pen from home.

## 2016-05-15 NOTE — Progress Notes (Signed)
Patient with no complaints or concerns during 7pm - 7am shift.  Yoshie Kosel, RN 

## 2016-05-15 NOTE — Discharge Summary (Signed)
Discharge Summary    Patient ID: Douglas Carr,  MRN: [id number], DOB/AGE: [birth year] 51 y.o.  Admit date: 05/12/2016 Discharge date: 05/15/2016  Primary Care Provider: Frederica Carr Primary Cardiologist: Dr. Allyson Carr  Discharge Diagnoses    Active Problems:   Acute on chronic combined systolic and diastolic CHF (congestive heart failure) (HCC)   Allergies No Known Allergies  Diagnostic Studies/Procedures    Echo done 05/15/16 pending results. _____________   History of Present Illness     51 year old man with diffuse CAD (recently admitted from 1/3-1/47for MI, had PCI of Om2), HFrEF (EF 35%), DMII (on insulin), HLD and carotid disease who presents with 8lb weight gain, worsening dyspnea and decreased UOP, despite full compliance with outpatient diuretics. Admitted for acute on chronic systolic HF.    Hospital Course     Consultants: None  Douglas Carr did not have good response to oral lasix upon discharge on 05/11/16 S/P STEMI and PCI with EF 30-35%. He presented with 8 lb weight gain, DOE, orthopnea and abdominal fullness. He was given lasix 40 mg IV bid with good diuresis X2 days and symptoms have greatly improved.   He will be sent home on Torsemide 20 mg bid as this may be better absorbed than lasix and pt has long standing diabetes and may have some gastric dysfunction.  Continue Losartan, aldactone, carvedilol, imdur, aspirin, Brilinta, potassium and lipitor.  The patient was last seen by Dr. Antoine Carr and deemed stable for discharge. He has been given a TOC follow up appt with Douglas Carr for early next week. BMP should be checked at that time. _____________  Discharge Vitals Blood pressure 103/62, pulse 83, temperature 98.1 F (36.7 C), temperature source Oral, resp. rate 18, height 5\' 9"  (1.753 m), weight 240 lb (108.9 kg), SpO2 99 %.  Filed Weights   05/13/16 1728 05/14/16 0537 05/15/16 0640  Weight: 241 lb 12.8 oz (109.7 kg) 239 lb 4.8 oz (108.5  kg) 240 lb (108.9 kg)    Labs & Radiologic Studies    CBC  Recent Labs  05/12/16 2003  WBC 9.4  HGB 12.3*  HCT 36.4*  MCV 84.3  PLT 212   Basic Metabolic Panel  Recent Labs  05/13/16 0237 05/15/16 0333  NA 136 137  K 4.3 4.1  CL 103 101  CO2 23 27  GLUCOSE 176* 162*  BUN 25* 23*  CREATININE 1.03 1.07  CALCIUM 8.6* 8.5*  MG 2.0  --    Liver Function Tests  Recent Labs  05/13/16 0237  AST 24  ALT 41  ALKPHOS 46  BILITOT 0.2*  PROT 6.2*  ALBUMIN 2.9*   No results for input(s): LIPASE, AMYLASE in the last 72 hours. Cardiac Enzymes  Recent Labs  05/13/16 0237 05/13/16 0906 05/13/16 1544  TROPONINI 2.43* 1.78* 2.22*   BNP Invalid input(s): POCBNP D-Dimer No results for input(s): DDIMER in the last 72 hours. Hemoglobin A1C No results for input(s): HGBA1C in the last 72 hours. Fasting Lipid Panel No results for input(s): CHOL, HDL, LDLCALC, TRIG, CHOLHDL, LDLDIRECT in the last 72 hours. Thyroid Function Tests No results for input(s): TSH, T4TOTAL, T3FREE, THYROIDAB in the last 72 hours.  Invalid input(s): FREET3 _____________  Dg Chest 2 View  Result Date: 05/12/2016 CLINICAL DATA:  Recent myocardial infarction. 3 pound weight gain and worsening shortness of breath. Assess for fluid overload. EXAM: CHEST  2 VIEW COMPARISON:  Chest radiograph May 07, 2016 FINDINGS: Cardiomediastinal silhouette is normal. Diffuse interstitial prominence.  Small pleural effusions and bibasilar strandy densities/atelectasis. No pneumothorax. Calcified granulomas projecting RIGHT mid and lower lung zone. Soft tissue planes and included osseous structures are nonsuspicious. Calcifications superior to the bilateral humeral heads associated with calcific tendinopathy. IMPRESSION: Diffuse interstitial prominence which can be secondary to atypical infection or fluid overload with new small pleural effusions. Electronically Signed   By: Douglas Metroourtnay  Carr M.D.   On: 05/12/2016 20:32     Dg Chest 2 View  Result Date: 05/07/2016 CLINICAL DATA:  Chest pain and dyspnea for 2 days. EXAM: CHEST  2 VIEW COMPARISON:  01/06/2012, 04/12/2012. FINDINGS: Calcified granulomatous changes are again evident, stable. There is diffuse interstitial thickening which may represent interstitial edema. This is new. No confluent airspace consolidation. No large effusions. Hilar and mediastinal contours are unremarkable and unchanged. IMPRESSION: New interstitial thickening. This may represent interstitial edema although a component of fibrosis is not excluded. Electronically Signed   By: Douglas Carr M.D.   On: 05/07/2016 00:24   Disposition   Pt is being discharged home today in good condition.  Follow-up Plans & Appointments    Follow-up Information    Barrett, Bjorn LoserRhonda, PA-C Follow up on 05/19/2016.   Specialties:  Cardiology, Radiology Why:  at 9:00 Contact information: [address] [place] [address] [phone]            Discharge Medications   Current Discharge Medication List    START taking these medications   Details  torsemide (DEMADEX) 20 MG tablet Take 1 tablet (20 mg total) by mouth 2 (two) times daily. Qty: 60 tablet, Refills: 3      CONTINUE these medications which have NOT CHANGED   Details  acetaminophen (TYLENOL) 325 MG tablet Take 2 tablets (650 mg total) by mouth every 4 (four) hours as needed for headache or mild pain.    aspirin 81 MG chewable tablet Chew 1 tablet (81 mg total) by mouth daily.    atorvastatin (LIPITOR) 80 MG tablet Take 1 tablet (80 mg total) by mouth daily at 6 PM. Qty: 90 tablet, Refills: 3    carvedilol (COREG) 6.25 MG tablet Take 1 tablet (6.25 mg total) by mouth 2 (two) times daily with a meal. Qty: 180 tablet, Refills: 3    insulin aspart (NOVOLOG FLEXPEN) 100 UNIT/ML FlexPen Inject 6 to 10 units 3 times a day prior to each meal and as needed for blood sugar greater than 200 Qty: 15 pen, Refills: 3     Insulin Degludec (TRESIBA FLEXTOUCH) 200 UNIT/ML SOPN Inject 160 Units into the skin daily. Qty: 27 mL, Refills: 5    isosorbide mononitrate (IMDUR) 30 MG 24 hr tablet Take 1 tablet (30 mg total) by mouth daily. Qty: 180 tablet, Refills: 3    losartan (COZAAR) 100 MG tablet Take 1 tablet (100 mg total) by mouth daily. Qty: 90 tablet, Refills: 3    nitroGLYCERIN (NITROSTAT) 0.4 MG SL tablet Place 1 tablet (0.4 mg total) under the tongue every 5 (five) minutes x 3 doses as needed for chest pain. Qty: 25 tablet, Refills: 2    potassium chloride SA (K-DUR,KLOR-CON) 20 MEQ tablet Take 1 tablet (20 mEq total) by mouth daily. Qty: 90 tablet, Refills: 3    spironolactone (ALDACTONE) 25 MG tablet Take 0.5 tablets (12.5 mg total) by mouth daily. Qty: 90 tablet, Refills: 3    ticagrelor (BRILINTA) 90 MG TABS tablet Take 1 tablet (90 mg total) by mouth 2 (two) times daily. Qty: 180 tablet, Refills: 3  STOP taking these medications     furosemide (LASIX) 40 MG tablet            Outstanding Labs/Studies   2D echo done 05/15/16- pending results  Check BMP at follow up.  Duration of Discharge Encounter   Greater than 30 minutes including physician time.  Signed, Berton Bon NP 05/15/2016, 12:11 PM Pager: [phone]  Patient seen and examined.  Plan as discussed in my rounding note for today and outlined above. Fayrene Fearing [facility]  05/15/2016  12:46 PM

## 2016-05-15 NOTE — Progress Notes (Addendum)
Inpatient Diabetes Program Recommendations  AACE/ADA: New Consensus Statement on Inpatient Glycemic Control (2015)  Target Ranges:  Prepandial:   less than 140 mg/dL      Peak postprandial:   less than 180 mg/dL (1-2 hours)      Critically ill patients:  140 - 180 mg/dL   Lab Results  Component Value Date   GLUCAP 156 (H) 05/15/2016   HGBA1C 9.3 (H) 05/07/2016   Results for Douglas Carr, Douglas Carr (MRN [id number]) as of 05/15/2016 13:43  Ref. Range 05/14/2016 12:05 05/14/2016 16:50 05/14/2016 20:53 05/15/2016 06:31 05/15/2016 12:02  Glucose-Capillary Latest Ref Range: 65 - 99 mg/dL 409199 (H) 811238 (H) 914243 (H) 132 (H) 156 (H)   Review of Glycemic Control  Diabetes history: DM2, obesity Outpatient Diabetes medications: Novolog 0-10 units TIDAC, Tresiba 160 units daily Current orders for Inpatient glycemic control: Novolog 0-15 units TIDACHS, Tresiba 160 units daily  Inpatient Diabetes Program Recommendations: Based on recommendations of DM coordinator Beryl Meager(Jenny Simpson, RN, MSN on 05/09/2015):     Please consider meal coverage Novolog 4 units TIDAC if patient eats > 50% of meal;      Decreasing Novolog correction to sensitive (Novolog 0-9 units TIDAC and 0-5 units QHS).  Thank you,  Kristine LineaKaren Harsha Yusko, RN, MSN Diabetes Coordinator Inpatient Diabetes Program [phone] (Team Pager)

## 2016-05-15 NOTE — Telephone Encounter (Signed)
[facility].  Patient understands to follow up with provider Theodore Demarkhonda Barrett [facility]. Patient understands discharge instructions? Yes* Patient understands medications and regimen? Yes Patient understands to bring all medications to this visit? Yes  Called patient Have discussed upcoming post hosp visit.- he is actually still at hospital.  He's already received information regarding plan to be discharged this afternoon. Aware that if he has questions regarding his discharge or medications, follow up instructions, etc - to call our office once he leaves hospital. Pt was thankful for discussion and assistance.

## 2016-05-15 NOTE — Progress Notes (Signed)
Patient Name: Douglas Carr Date of Encounter: 05/15/2016  Primary Cardiologist: Dr. Crista Curb Problem List     Active Problems:   Acute on chronic combined systolic and diastolic CHF (congestive heart failure) (HCC)    Subjective   Patient is feeling much better this morning. No chest pain or shortness of breath. He feels that his abdomen is less full, breathing is better and he is ready to go home.  Inpatient Medications    Scheduled Meds: . aspirin  81 mg Oral Daily  . atorvastatin  80 mg Oral q1800  . carvedilol  6.25 mg Oral BID WC  . furosemide  40 mg Intravenous BID  . insulin aspart  0-15 Units Subcutaneous TID AC & HS  . Insulin Degludec  160 Units Subcutaneous Daily  . isosorbide mononitrate  30 mg Oral Daily  . losartan  100 mg Oral Daily  . potassium chloride SA  20 mEq Oral Daily  . sodium chloride flush  3 mL Intravenous Q12H  . spironolactone  12.5 mg Oral Daily  . ticagrelor  90 mg Oral BID   Continuous Infusions:  PRN Meds: sodium chloride, acetaminophen, ALPRAZolam, guaiFENesin-dextromethorphan, nitroGLYCERIN, ondansetron (ZOFRAN) IV, sodium chloride flush   Vital Signs    Vitals:   05/14/16 0537 05/14/16 1200 05/14/16 2053 05/15/16 0640  BP: 107/64 (!) 106/56 (!) 117/57 (!) 111/56  Pulse: 88 87 91 88  Resp: 18 18  18   Temp: 97.9 F (36.6 C) 97.5 F (36.4 C) 97.9 F (36.6 C) 98 F (36.7 C)  TempSrc: Oral Oral Oral Oral  SpO2: 96% 98% 97% 97%  Weight: 239 lb 4.8 oz (108.5 kg)   240 lb (108.9 kg)  Height:        Intake/Output Summary (Last 24 hours) at 05/15/16 0729 Last data filed at 05/15/16 0640  Gross per 24 hour  Intake              940 ml  Output             1950 ml  Net            -1010 ml   Filed Weights   05/13/16 1728 05/14/16 0537 05/15/16 0640  Weight: 241 lb 12.8 oz (109.7 kg) 239 lb 4.8 oz (108.5 kg) 240 lb (108.9 kg)    Physical Exam    GEN: Pleasant caucasian male, in no acute distress.  HEENT:  Grossly normal.  Neck: Supple, no JVD, carotid bruits, or masses. Cardiac: RRR, no murmurs, rubs, or gallops. No clubbing, cyanosis, edema.  Radials/DP/PT 2+ and equal bilaterally.  Respiratory:  Respirations regular and unlabored, clear to auscultation bilaterally. GI: Soft, nontender, round, BS + x 4. MS: no deformity or atrophy. Skin: warm and dry, no rash. Neuro:  Strength and sensation are intact. Psych: AAOx3.  Normal affect.  Labs    CBC  Recent Labs  05/12/16 2003  WBC 9.4  HGB 12.3*  HCT 36.4*  MCV 84.3  PLT 212   Basic Metabolic Panel  Recent Labs  05/13/16 0237 05/15/16 0333  NA 136 137  K 4.3 4.1  CL 103 101  CO2 23 27  GLUCOSE 176* 162*  BUN 25* 23*  CREATININE 1.03 1.07  CALCIUM 8.6* 8.5*  MG 2.0  --    Liver Function Tests  Recent Labs  05/13/16 0237  AST 24  ALT 41  ALKPHOS 46  BILITOT 0.2*  PROT 6.2*  ALBUMIN 2.9*   No results  for input(s): LIPASE, AMYLASE in the last 72 hours. Cardiac Enzymes  Recent Labs  05/13/16 0237 05/13/16 0906 05/13/16 1544  TROPONINI 2.43* 1.78* 2.22*   BNP Invalid input(s): POCBNP D-Dimer No results for input(s): DDIMER in the last 72 hours. Hemoglobin A1C No results for input(s): HGBA1C in the last 72 hours. Fasting Lipid Panel No results for input(s): CHOL, HDL, LDLCALC, TRIG, CHOLHDL, LDLDIRECT in the last 72 hours. Thyroid Function Tests No results for input(s): TSH, T4TOTAL, T3FREE, THYROIDAB in the last 72 hours.  Invalid input(s): FREET3  Telemetry    Sinus rhythm with rates in the 80s to 90s - Personally Reviewed  ECG    No new tracings  Radiology    No results found.  Cardiac Studies   Echocardiogram pending  Patient Profile     51 year old man with diffuse CAD (recently admitted from 1/3-1/28for MI, had PCI of Om2), HFrEF (EF 35%), DMII (on insulin), HLD and carotid disease who presents with 8lb weight gain, worsening dyspnea and decreased UOP, despite full compliance with  outpatient diuretics. Being admitted for acute on chronic systolic HF.   Assessment & Plan    1. Acute on chronic combined systolic and diastolic congestive heart failure -BNP on admit 610 (483 just 5 days ago). -Weight was up from 239 lb (recent discharge weight) to 246 lb at presentation  After bieng home for less thatn a day after previous hosptialization for NSTEMI. 1/8 pm and back to 239 lbs (1/10), 240 (1/11) -Chest x-ray indicated diffuse interstitial prominence which can be secondary to atypical infection or fluid overload with new small pleural effusions - Good UOP with IV Lasix, I&O -1010 for 24h, -2297 since admit -SCr, K and BP all stable -Home Lasix dosing was 40 mg by mouth twice a day and has been receiving Lasix 40 mg IV twice a day and symptoms are improved today. Continue spironolactone. -Blood pressure is stable with readings 106-121/60s -Strict I/O and daily weights -Reviewed low-sodium diet with patient and his wife -Pt breathing better this morning, plan to discharge patient home on torsemide instead of furosemide for better absorption. Discussed with patient and his wife. -Continue beta blocker is no evidence of cardiogenic shock/severe decompensated heart failure  -Follow up appt has been scheduled for Monday. Instructions for daily weights and when to notify  2. CAD -Status post PCI to OM 2. Residual severe diffuse native disease involving the proximal circumflex, LAD and right coronary artery. No chest pain. Troponin is abnormal at 1.78 but no increase from previous hospitalization suspect residual from recent MI- 4.9>>3.5>>2.43>>1.7.  Current trend 2.25, 2.28, 2.43, 1.78, 2.22 -Continue Brilinta and aspirin -continue Coreg 6.25 mg twice a day -continue spironolactone 12.5 mg and losartan 100 mg and Imdur 30 mg  3. Diabetes type 2  -CBGs and SSI ordered  4. Hyperlipidemia -On statin, Lipitor 80 mg nightly -Recent lipid panel on 05/07/2016 showed elevated LDL of  171 mg/dL. Goal given CAD and recent MI is less than 70 mg/dL -Continue high intensity statin and plan on repeat fasting lipid panel and LFT's in 6 weeks   Signed, Berton Bon, NP  05/15/2016, 7:29 AM  Pager:  [phone]  History and all data above reviewed.  Patient examined.  I agree with the findings as above.  He is breathing better and wants to go home.  No pain.  The patient exam reveals COR:RRR  ,  Lungs: Clear  ,  Abd: Positive bowel sounds, no rebound no guarding, Ext No edema  .  All available labs, radiology testing, previous records reviewed. Agree with documented assessment and plan. Ischemic CM:  Acute on chronic systolic and diastolic HF.  I reviewed his echo and his EF remains about the same/low at 30%.  Discuss salt/fluid restriction.  He will change to Torsemide.  Needs TOC appt within 7 days.  Home on Torsemide 20 mg bid and extra 20 mg PRN weight gain.    Fayrene FearingJames Park Beck  12:34 PM  05/15/2016

## 2016-05-15 NOTE — Progress Notes (Signed)
Echocardiogram 2D Echocardiogram has been performed.  Douglas Carr 05/15/2016, 11:25 AM

## 2016-05-16 ENCOUNTER — Telehealth (HOSPITAL_COMMUNITY): Payer: Self-pay | Admitting: Family Medicine

## 2016-05-16 NOTE — Telephone Encounter (Signed)
S/w Douglas Carr with [employer], verified insurance, Deductible $1250.00 with 20% Co-Insurance, Out of Pocket $4,350.00, no limits Reference # [id number] ... KJ

## 2016-05-19 ENCOUNTER — Ambulatory Visit (INDEPENDENT_AMBULATORY_CARE_PROVIDER_SITE_OTHER): Payer: BC Managed Care – PPO | Admitting: Physician Assistant

## 2016-05-19 ENCOUNTER — Encounter: Payer: Self-pay | Admitting: Physician Assistant

## 2016-05-19 VITALS — BP 117/79 | HR 86 | Ht 69.0 in | Wt 237.2 lb

## 2016-05-19 DIAGNOSIS — I779 Disorder of arteries and arterioles, unspecified: Secondary | ICD-10-CM

## 2016-05-19 DIAGNOSIS — I5022 Chronic systolic (congestive) heart failure: Secondary | ICD-10-CM

## 2016-05-19 DIAGNOSIS — I255 Ischemic cardiomyopathy: Secondary | ICD-10-CM

## 2016-05-19 DIAGNOSIS — E1169 Type 2 diabetes mellitus with other specified complication: Secondary | ICD-10-CM | POA: Diagnosis not present

## 2016-05-19 DIAGNOSIS — E782 Mixed hyperlipidemia: Secondary | ICD-10-CM

## 2016-05-19 DIAGNOSIS — I213 ST elevation (STEMI) myocardial infarction of unspecified site: Secondary | ICD-10-CM

## 2016-05-19 DIAGNOSIS — I739 Peripheral vascular disease, unspecified: Secondary | ICD-10-CM

## 2016-05-19 NOTE — Patient Instructions (Signed)
Medication Instructions:  Your physician recommends that you continue on your current medications as directed. Please refer to the Current Medication list given to you today.  Labwork: Your physician recommends that you return for lab work in: TODAY--CMET and Lipid  Testing/Procedures: Your physician has requested that you have an echocardiogram. Echocardiography is a painless test that uses sound waves to create images of your heart. It provides your doctor with information about the size and shape of your heart and how well your heart's chambers and valves are working. This procedure takes approximately one hour. There are no restrictions for this procedure.  Your physician has requested that you have a carotid duplex. This test is an ultrasound of the carotid arteries in your neck. It looks at blood flow through these arteries that supply the brain with blood. Allow one hour for this exam. There are no restrictions or special instructions.  Follow-Up: Your physician recommends that you schedule a follow-up appointment in: 3 MONTHS WITH DR Allyson SabalBERRY.  Any Other Special Instructions Will Be Listed Below (If Applicable).     If you need a refill on your cardiac medications before your next appointment, please call your pharmacy.

## 2016-05-19 NOTE — Progress Notes (Signed)
Cardiology Office Note   Date:  05/19/2016   ID:  Douglas Carr, DOB [birth year], MRN [id number]  PCP:  [name], MD  Cardiologist:  Dr Allyson Sabal  VVS: Dr Christinia Gully, PA-C   Chief Complaint  Patient presents with  . Follow-up    post stent.  . Shortness of Breath    has decreased    History of Present Illness: Douglas Carr is a 51 y.o. male with a history of S-D-CHF, HLD R-CEA 2010, DM2, STEMI w/ DES OM2 05/07/2016  Admit 01/08-01/03/2017 w/ CHF d/c wt 240 lbs  Douglas Carr presents for post-hospital f/u.  They are doing better at eating a low-sodium DM diet. His legs have not been swelling. He has no orthopnea or PND. He is tracking his weight. It has been stable, trending down a little bit.   He has been contacted by cardiac rehab and will follow up with them.   He has not had chest pain. He has been increasing his activity level and not having any problems with that. His cath site has healed in well. His job with the [employer] is strenuous at times, he has not been back to work. He has papers with him to fill out.    Past Medical History:  Diagnosis Date  . Anginal pain (HCC)    secondary to sm. vessel disease  . Anxiety   . Arthritis    BIL KNEE PAIN AND BIL ANKLE PAIN  . Bone spur of ankle   . Coronary artery disease    Remote MI at 51 years of age, Last cath 2010-diffuse non-obstructive disease; last echo 06/16/08 -normal LV function, moderate concentric hypertrophy; Last nuc 08/2008 no ischemia;  medical therapy  . Diabetes mellitus    ON ORAL MEDICATION AND INSULIN  . Hyperlipidemia   . Hypertension   . Myocardial infarction 1996   Post MI  . Peripheral vascular disease (HCC)    HAS LEFT CAROTID ARTERY STENOSIS   AND IS S/P RIGHT CAROTID ENDARTERECTOMY 2010 Last carotid dopplers 01/08/2012 wth patent endarterectomy site    Past Surgical History:  Procedure Laterality Date  . APPENDECTOMY    . CARDIAC CATHETERIZATION     FEB 2010, significant branch vessel disease wth diag, marginal, PDA & PLA, nml. LV function  . CARDIAC CATHETERIZATION N/A 05/07/2016   Procedure: Left Heart Cath and Coronary Angiography;  Surgeon: Peter M Swaziland, MD;  Location: [facility] INVASIVE CV LAB;  Service: Cardiovascular;  Laterality: N/A;  . CARDIAC CATHETERIZATION N/A 05/07/2016   Procedure: Coronary Stent Intervention;  Surgeon: Peter M Swaziland, MD;  Location: [facility] INVASIVE CV LAB;  Service: Cardiovascular;  Laterality: N/A;  . CAROTID ENDARTERECTOMY  09/2008   Rt CEA  . LUNG BIOPSY  2013   Bx's suggest granulomatous dz.  . RT ANKLE   2013    Current Outpatient Prescriptions  Medication Sig Dispense Refill  . acetaminophen (TYLENOL) 325 MG tablet Take 2 tablets (650 mg total) by mouth every 4 (four) hours as needed for headache or mild pain.    Marland Kitchen aspirin 81 MG chewable tablet Chew 1 tablet (81 mg total) by mouth daily.    Marland Kitchen atorvastatin (LIPITOR) 80 MG tablet Take 1 tablet (80 mg total) by mouth daily at 6 PM. 90 tablet 3  . carvedilol (COREG) 6.25 MG tablet Take 1 tablet (6.25 mg total) by mouth 2 (two) times daily with a meal. 180 tablet 3  . insulin aspart (NOVOLOG FLEXPEN)  100 UNIT/ML FlexPen Inject 6 to 10 units 3 times a day prior to each meal and as needed for blood sugar greater than 200 (Patient taking differently: Inject 0-10 Units into the skin 3 (three) times daily as needed for high blood sugar. as needed for blood sugar greater than 200) 15 pen 3  . Insulin Degludec (TRESIBA FLEXTOUCH) 200 UNIT/ML SOPN Inject 160 Units into the skin daily. 27 mL 5  . isosorbide mononitrate (IMDUR) 30 MG 24 hr tablet Take 1 tablet (30 mg total) by mouth daily. 180 tablet 3  . losartan (COZAAR) 100 MG tablet Take 1 tablet (100 mg total) by mouth daily. 90 tablet 3  . nitroGLYCERIN (NITROSTAT) 0.4 MG SL tablet Place 1 tablet (0.4 mg total) under the tongue every 5 (five) minutes x 3 doses as needed for chest pain. 25 tablet 2  . potassium chloride SA  (K-DUR,KLOR-CON) 20 MEQ tablet Take 1 tablet (20 mEq total) by mouth daily. 90 tablet 3  . spironolactone (ALDACTONE) 25 MG tablet Take 0.5 tablets (12.5 mg total) by mouth daily. 90 tablet 3  . ticagrelor (BRILINTA) 90 MG TABS tablet Take 1 tablet (90 mg total) by mouth 2 (two) times daily. 180 tablet 3  . torsemide (DEMADEX) 20 MG tablet Take 1 tablet (20 mg total) by mouth 2 (two) times daily. 60 tablet 3   No current facility-administered medications for this visit.     Allergies:   Patient has no known allergies.    Social History:  The patient  reports that he quit smoking about 4 years ago. His smoking use included Cigarettes. He has a 44.00 pack-year smoking history. He has never used smokeless tobacco. He reports that he drinks alcohol. He reports that he does not use drugs.   Family History:  The patient's family history includes Diabetes in his mother; Heart attack in his paternal grandfather; Heart attack (age of onset: 82) in his father; Hypertension in his maternal grandfather and mother.    ROS:  Please see the history of present illness. All other systems are reviewed and negative.    PHYSICAL EXAM: VS:  BP 117/79   Pulse 86   Ht 5\' 9"  (1.753 m)   Wt 237 lb 3.2 oz (107.6 kg)   BMI 35.03 kg/m  , BMI Body mass index is 35.03 kg/m. GEN: Well nourished, well developed, male in no acute distress  HEENT: normal for age  Neck: minimal JVD, no carotid bruit, no masses Cardiac: RRR; no murmur, no rubs, or gallops Respiratory: decreased BS bases bilaterally, normal work of breathing GI: soft, nontender, nondistended, + BS MS: no deformity or atrophy; no edema; distal pulses are 2+ in all 4 extremities   Skin: warm and dry, no rash Neuro:  Strength and sensation are intact Psych: euthymic mood, full affect   EKG:  EKG is ordered today. The ekg ordered today demonstrates SR, Lateral T-wave inversions consistent with recent MI  ECHO: 05/15/2016 - Left ventricle: The  cavity size was moderately dilated. Wall   thickness was normal. Systolic function was severely reduced. The   estimated ejection fraction was in the range of 25% to 30%.   Akinesis and scarring of the anteroseptal and anterior   myocardium; consistent with infarction in the distribution of the   left anterior descending coronary artery. Dyskinesis of the   apicalanterior myocardium. Features are consistent with a   pseudonormal left ventricular filling pattern, with concomitant   abnormal relaxation and increased filling pressure (  grade 2   diastolic dysfunction). No evidence of thrombus. - Left atrium: The atrium was mildly dilated. - Atrial septum: No defect or patent foramen ovale was identified.  Recent Labs: 05/07/2016: TSH 3.503 05/12/2016: B Natriuretic Peptide 610.1; Hemoglobin 12.3; Platelets 212 05/13/2016: ALT 41; Magnesium 2.0 05/15/2016: BUN 23; Creatinine, Ser 1.07; Potassium 4.1; Sodium 137    Lipid Panel    Component Value Date/Time   CHOL 245 (H) 05/07/2016 0315   CHOL 253 (H) 10/20/2014 1630   TRIG 183 (H) 05/07/2016 0315   HDL 37 (L) 05/07/2016 0315   HDL 27 (L) 10/20/2014 1630   CHOLHDL 6.6 05/07/2016 0315   VLDL 37 05/07/2016 0315   LDLCALC 171 (H) 05/07/2016 0315   LDLCALC 154 (H) 10/20/2014 1630     Wt Readings from Last 3 Encounters:  05/19/16 237 lb 3.2 oz (107.6 kg)  05/15/16 240 lb (108.9 kg)  05/11/16 239 lb 12.8 oz (108.8 kg)     Other studies Reviewed: Additional studies/ records that were reviewed today include: Office notes, hospital records and testing.  ASSESSMENT AND PLAN:  1.  Chronic systolic CHF: Douglas Carr is doing much better with diet and fluid compliance. He is encouraged to continue this. He was given parameters for calling us. He is on torsemide, Aldactone, and potassium. He is on a beta blocker and an ARB. He needs a BMET in a week or so. If his EF does not improve, or he has additional admissions for CHF, at [facility], [facility].  2. Recent  STEMI: He is doing well and is increasing his activity. He is to follow-up with cardiac rehabilitation. He is on aspirin, high-dose statin, beta blocker, ARB and Imdur.  3. Ischemic cardiomyopathy: He is on Coreg 6.25 mg twice a day, losartan 100 mg a day and spironolactone 12.5. His blood pressure is at target. If his EF does not improve, he will need to be considered for an ICD.  4. Carotid artery disease: He has had a right CEA in the past. He has not had carotid Dopplers since 2014. We will obtain these. Dr. Hart RochesterLawson did his previous surgery.  5. Hyperlipidemia: Recheck CMET and lipids.   Current medicines are reviewed at length with the patient today.  The patient does not have concerns regarding medicines.  The following changes have been made:  no change  Labs/ tests ordered today include:   Orders Placed This Encounter  Procedures  . Comprehensive Metabolic Panel (CMET)  . Lipid panel  . EKG 12-Lead  . ECHOCARDIOGRAM COMPLETE     Disposition:   FU with Dr. Allyson SabalBerry  Signed, Theodore DemarkBarrett, Rhonda, PA-C  05/19/2016 1:17 PM    [facility] Phone: [phone]; Fax: [phone]) [phone]  This note was written with the assistance of speech recognition software. Please excuse any transcriptional errors.

## 2016-05-23 ENCOUNTER — Ambulatory Visit (INDEPENDENT_AMBULATORY_CARE_PROVIDER_SITE_OTHER): Payer: BC Managed Care – PPO | Admitting: Family Medicine

## 2016-05-23 ENCOUNTER — Encounter: Payer: Self-pay | Admitting: Family Medicine

## 2016-05-23 VITALS — BP 120/78 | HR 80 | Temp 98.0°F | Ht 69.0 in | Wt 234.0 lb

## 2016-05-23 DIAGNOSIS — E1169 Type 2 diabetes mellitus with other specified complication: Secondary | ICD-10-CM

## 2016-05-23 DIAGNOSIS — I10 Essential (primary) hypertension: Secondary | ICD-10-CM | POA: Diagnosis not present

## 2016-05-23 DIAGNOSIS — Z09 Encounter for follow-up examination after completed treatment for conditions other than malignant neoplasm: Secondary | ICD-10-CM

## 2016-05-23 DIAGNOSIS — E785 Hyperlipidemia, unspecified: Secondary | ICD-10-CM

## 2016-05-23 DIAGNOSIS — Z794 Long term (current) use of insulin: Secondary | ICD-10-CM

## 2016-05-23 DIAGNOSIS — I5023 Acute on chronic systolic (congestive) heart failure: Secondary | ICD-10-CM | POA: Diagnosis not present

## 2016-05-23 DIAGNOSIS — E1165 Type 2 diabetes mellitus with hyperglycemia: Secondary | ICD-10-CM | POA: Diagnosis not present

## 2016-05-23 NOTE — Patient Instructions (Signed)
Continue current medications. Continue good therapeutic lifestyle changes which include good diet and exercise. Fall precautions discussed with patient. If an FOBT was given today- please return it to our front desk. If you are over 50 years old - you may need Prevnar 13 or the adult Pneumonia vaccine.  **Flu shots are available--- please call and schedule a FLU-CLINIC appointment**  After your visit with us today you will receive a survey in the mail or online from [employer] regarding your care with us. Please take a moment to fill this out. Your feedback is very important to us as you can help us better understand your patient needs as well as improve your experience and satisfaction. WE CARE ABOUT YOU!!!    

## 2016-05-23 NOTE — Progress Notes (Signed)
Subjective:    Patient ID: Douglas Carr, male    DOB: Mar 26, 1966, 51 y.o.   MRN: [id number]  HPI Pt here for follow up and management of chronic medical problems which includes diabetes, hyperlipidemia, and hypertension. He was also recently admitted to [facility] for a heart attack. Had a subsequent admission for heart failure and was diuresed. Since being discharged to feels well. There is no orthopnea dyspnea or dependent edema. He has no chest pains. He is here today for follow-up per cardiology discharge    Patient Active Problem List   Diagnosis Date Noted  . Acute on chronic combined systolic and diastolic CHF (congestive heart failure) ([place]) 05/13/2016  . Status post coronary artery stent placement   . Acute on chronic systolic heart failure ([place])   . Acute ST elevation myocardial infarction (STEMI) involving left circumflex coronary artery ([place]) 05/07/2016  . Hypertension 09/21/2013  . Diabetes mellitus ([place]) 09/21/2013  . Combined hyperlipidemia associated with type 2 diabetes mellitus 09/21/2013  . Pulmonary nodules 10/16/2011   Outpatient Encounter Prescriptions as of 05/23/2016  Medication Sig  . acetaminophen (TYLENOL) 325 MG tablet Take 2 tablets (650 mg total) by mouth every 4 (four) hours as needed for headache or mild pain.  Marland Kitchen aspirin 81 MG chewable tablet Chew 1 tablet (81 mg total) by mouth daily.  Marland Kitchen atorvastatin (LIPITOR) 80 MG tablet Take 1 tablet (80 mg total) by mouth daily at 6 PM.  . carvedilol (COREG) 6.25 MG tablet Take 1 tablet (6.25 mg total) by mouth 2 (two) times daily with a meal.  . insulin aspart (NOVOLOG FLEXPEN) 100 UNIT/ML FlexPen Inject 6 to 10 units 3 times a day prior to each meal and as needed for blood sugar greater than 200 (Patient taking differently: Inject 0-10 Units into the skin 3 (three) times daily as needed for high blood sugar. as needed for blood sugar greater than 200)  . Insulin Degludec (TRESIBA FLEXTOUCH) 200 UNIT/ML SOPN Inject  160 Units into the skin daily.  . isosorbide mononitrate (IMDUR) 30 MG 24 hr tablet Take 1 tablet (30 mg total) by mouth daily.  Marland Kitchen losartan (COZAAR) 100 MG tablet Take 1 tablet (100 mg total) by mouth daily.  . nitroGLYCERIN (NITROSTAT) 0.4 MG SL tablet Place 1 tablet (0.4 mg total) under the tongue every 5 (five) minutes x 3 doses as needed for chest pain.  . potassium chloride SA (K-DUR,KLOR-CON) 20 MEQ tablet Take 1 tablet (20 mEq total) by mouth daily.  Marland Kitchen spironolactone (ALDACTONE) 25 MG tablet Take 0.5 tablets (12.5 mg total) by mouth daily.  . ticagrelor (BRILINTA) 90 MG TABS tablet Take 1 tablet (90 mg total) by mouth 2 (two) times daily.  Marland Kitchen torsemide (DEMADEX) 20 MG tablet Take 1 tablet (20 mg total) by mouth 2 (two) times daily.   No facility-administered encounter medications on file as of 05/23/2016.      Review of Systems  Constitutional: Negative.   HENT: Negative.   Eyes: Negative.   Respiratory: Negative.   Cardiovascular: Negative.   Gastrointestinal: Negative.   Endocrine: Negative.   Genitourinary: Negative.   Musculoskeletal: Negative.   Skin: Negative.   Allergic/Immunologic: Negative.   Neurological: Negative.   Hematological: Negative.   Psychiatric/Behavioral: Negative.        Objective:   Physical Exam  Constitutional: He is oriented to person, place, and time. He appears well-developed and well-nourished.  Cardiovascular: Normal rate, regular rhythm and normal heart sounds.   Pulmonary/Chest: Effort normal and  breath sounds normal.  Neurological: He is alert and oriented to person, place, and time.  Psychiatric: He has a normal mood and affect.   BP 120/78 (BP Location: Left Arm)   Pulse 80   Temp 98 F (36.7 C) (Oral)   Ht 5' 9"  (1.753 m)   Wt 234 lb (106.1 kg)   BMI 34.56 kg/m         Assessment & Plan:  1. Hospital discharge follow-up Doing well since discharge. Patient will concentrate more on diabetes - CMP14+EGFR; Future - Lipid  panel; Future  2. Type 2 diabetes mellitus with hyperglycemia, with long-term current use of insulin [facility]) Patient now realizes importance of diabetic control and I think he will do better going forward. Had A1c done in the hospital which was 9.3  3. Essential hypertension Blood pressure well controlled today 120/78 - CMP14+EGFR; Future  4. Hyperlipidemia associated with type 2 diabetes mellitus ([place]) Unsure why we are doing lipids since he just started on a high-dose atorvastatin. I would personally give this about 2 months before a recheck - Lipid panel; Future  5. Acute on chronic systolic heart failure (HCC) No symptoms or signs on physical exam of heart failure. Lungs are clear there is no dependent edema and no JVD  Wardell Honour MD

## 2016-05-24 ENCOUNTER — Inpatient Hospital Stay (HOSPITAL_COMMUNITY): Payer: BC Managed Care – PPO

## 2016-05-24 ENCOUNTER — Encounter (HOSPITAL_COMMUNITY)
Admission: EM | Disposition: A | Discharge status: Skilled Nursing Facility | Payer: Self-pay | Source: Home / Self Care | Attending: Cardiology

## 2016-05-24 ENCOUNTER — Emergency Department (HOSPITAL_COMMUNITY): Payer: BC Managed Care – PPO

## 2016-05-24 ENCOUNTER — Other Ambulatory Visit: Payer: BC Managed Care – PPO

## 2016-05-24 ENCOUNTER — Inpatient Hospital Stay (HOSPITAL_COMMUNITY)
Admission: EM | Admit: 2016-05-24 | Discharge: 2016-06-17 | DRG: 246 | Disposition: A | Discharge status: Skilled Nursing Facility | Payer: BC Managed Care – PPO | Attending: Internal Medicine | Admitting: Internal Medicine

## 2016-05-24 ENCOUNTER — Other Ambulatory Visit (HOSPITAL_COMMUNITY): Payer: Self-pay | Admitting: Radiology

## 2016-05-24 ENCOUNTER — Encounter (HOSPITAL_COMMUNITY): Payer: Self-pay | Admitting: Emergency Medicine

## 2016-05-24 DIAGNOSIS — I4901 Ventricular fibrillation: Secondary | ICD-10-CM | POA: Diagnosis present

## 2016-05-24 DIAGNOSIS — I2129 ST elevation (STEMI) myocardial infarction involving other sites: Secondary | ICD-10-CM | POA: Diagnosis present

## 2016-05-24 DIAGNOSIS — I4891 Unspecified atrial fibrillation: Secondary | ICD-10-CM | POA: Diagnosis present

## 2016-05-24 DIAGNOSIS — Z79899 Other long term (current) drug therapy: Secondary | ICD-10-CM

## 2016-05-24 DIAGNOSIS — E1169 Type 2 diabetes mellitus with other specified complication: Secondary | ICD-10-CM

## 2016-05-24 DIAGNOSIS — I10 Essential (primary) hypertension: Secondary | ICD-10-CM | POA: Diagnosis not present

## 2016-05-24 DIAGNOSIS — I5043 Acute on chronic combined systolic (congestive) and diastolic (congestive) heart failure: Secondary | ICD-10-CM | POA: Diagnosis not present

## 2016-05-24 DIAGNOSIS — Z7189 Other specified counseling: Secondary | ICD-10-CM | POA: Diagnosis not present

## 2016-05-24 DIAGNOSIS — Z794 Long term (current) use of insulin: Secondary | ICD-10-CM

## 2016-05-24 DIAGNOSIS — E870 Hyperosmolality and hypernatremia: Secondary | ICD-10-CM

## 2016-05-24 DIAGNOSIS — E871 Hypo-osmolality and hyponatremia: Secondary | ICD-10-CM | POA: Diagnosis not present

## 2016-05-24 DIAGNOSIS — R509 Fever, unspecified: Secondary | ICD-10-CM

## 2016-05-24 DIAGNOSIS — I639 Cerebral infarction, unspecified: Secondary | ICD-10-CM

## 2016-05-24 DIAGNOSIS — E876 Hypokalemia: Secondary | ICD-10-CM | POA: Diagnosis not present

## 2016-05-24 DIAGNOSIS — Z6834 Body mass index (BMI) 34.0-34.9, adult: Secondary | ICD-10-CM

## 2016-05-24 DIAGNOSIS — G931 Anoxic brain damage, not elsewhere classified: Secondary | ICD-10-CM | POA: Diagnosis present

## 2016-05-24 DIAGNOSIS — Z931 Gastrostomy status: Secondary | ICD-10-CM

## 2016-05-24 DIAGNOSIS — I255 Ischemic cardiomyopathy: Secondary | ICD-10-CM | POA: Diagnosis present

## 2016-05-24 DIAGNOSIS — Z833 Family history of diabetes mellitus: Secondary | ICD-10-CM | POA: Diagnosis not present

## 2016-05-24 DIAGNOSIS — I472 Ventricular tachycardia: Secondary | ICD-10-CM | POA: Diagnosis present

## 2016-05-24 DIAGNOSIS — E118 Type 2 diabetes mellitus with unspecified complications: Secondary | ICD-10-CM

## 2016-05-24 DIAGNOSIS — Z8249 Family history of ischemic heart disease and other diseases of the circulatory system: Secondary | ICD-10-CM

## 2016-05-24 DIAGNOSIS — I110 Hypertensive heart disease with heart failure: Secondary | ICD-10-CM | POA: Diagnosis present

## 2016-05-24 DIAGNOSIS — E1151 Type 2 diabetes mellitus with diabetic peripheral angiopathy without gangrene: Secondary | ICD-10-CM | POA: Diagnosis present

## 2016-05-24 DIAGNOSIS — Z7982 Long term (current) use of aspirin: Secondary | ICD-10-CM

## 2016-05-24 DIAGNOSIS — Z978 Presence of other specified devices: Secondary | ICD-10-CM

## 2016-05-24 DIAGNOSIS — E1165 Type 2 diabetes mellitus with hyperglycemia: Secondary | ICD-10-CM | POA: Diagnosis present

## 2016-05-24 DIAGNOSIS — I6521 Occlusion and stenosis of right carotid artery: Secondary | ICD-10-CM | POA: Diagnosis not present

## 2016-05-24 DIAGNOSIS — I25119 Atherosclerotic heart disease of native coronary artery with unspecified angina pectoris: Secondary | ICD-10-CM | POA: Diagnosis present

## 2016-05-24 DIAGNOSIS — R1310 Dysphagia, unspecified: Secondary | ICD-10-CM

## 2016-05-24 DIAGNOSIS — E1159 Type 2 diabetes mellitus with other circulatory complications: Secondary | ICD-10-CM | POA: Diagnosis present

## 2016-05-24 DIAGNOSIS — Z789 Other specified health status: Secondary | ICD-10-CM | POA: Diagnosis not present

## 2016-05-24 DIAGNOSIS — I213 ST elevation (STEMI) myocardial infarction of unspecified site: Secondary | ICD-10-CM

## 2016-05-24 DIAGNOSIS — Z9289 Personal history of other medical treatment: Secondary | ICD-10-CM

## 2016-05-24 DIAGNOSIS — I469 Cardiac arrest, cause unspecified: Secondary | ICD-10-CM | POA: Diagnosis not present

## 2016-05-24 DIAGNOSIS — I2121 ST elevation (STEMI) myocardial infarction involving left circumflex coronary artery: Secondary | ICD-10-CM | POA: Diagnosis not present

## 2016-05-24 DIAGNOSIS — G253 Myoclonus: Secondary | ICD-10-CM

## 2016-05-24 DIAGNOSIS — R680 Hypothermia, not associated with low environmental temperature: Secondary | ICD-10-CM | POA: Diagnosis present

## 2016-05-24 DIAGNOSIS — E669 Obesity, unspecified: Secondary | ICD-10-CM

## 2016-05-24 DIAGNOSIS — R339 Retention of urine, unspecified: Secondary | ICD-10-CM | POA: Diagnosis not present

## 2016-05-24 DIAGNOSIS — R570 Cardiogenic shock: Secondary | ICD-10-CM | POA: Diagnosis present

## 2016-05-24 DIAGNOSIS — R569 Unspecified convulsions: Secondary | ICD-10-CM

## 2016-05-24 DIAGNOSIS — K567 Ileus, unspecified: Secondary | ICD-10-CM

## 2016-05-24 DIAGNOSIS — Z0189 Encounter for other specified special examinations: Secondary | ICD-10-CM

## 2016-05-24 DIAGNOSIS — N179 Acute kidney failure, unspecified: Secondary | ICD-10-CM | POA: Diagnosis present

## 2016-05-24 DIAGNOSIS — R0902 Hypoxemia: Secondary | ICD-10-CM | POA: Diagnosis not present

## 2016-05-24 DIAGNOSIS — I420 Dilated cardiomyopathy: Secondary | ICD-10-CM | POA: Diagnosis not present

## 2016-05-24 DIAGNOSIS — Z955 Presence of coronary angioplasty implant and graft: Secondary | ICD-10-CM | POA: Diagnosis not present

## 2016-05-24 DIAGNOSIS — G9340 Encephalopathy, unspecified: Secondary | ICD-10-CM | POA: Diagnosis not present

## 2016-05-24 DIAGNOSIS — D72829 Elevated white blood cell count, unspecified: Secondary | ICD-10-CM

## 2016-05-24 DIAGNOSIS — J384 Edema of larynx: Secondary | ICD-10-CM | POA: Diagnosis not present

## 2016-05-24 DIAGNOSIS — Z4659 Encounter for fitting and adjustment of other gastrointestinal appliance and device: Secondary | ICD-10-CM

## 2016-05-24 DIAGNOSIS — Z95828 Presence of other vascular implants and grafts: Secondary | ICD-10-CM

## 2016-05-24 DIAGNOSIS — T85598A Other mechanical complication of other gastrointestinal prosthetic devices, implants and grafts, initial encounter: Secondary | ICD-10-CM

## 2016-05-24 DIAGNOSIS — G9341 Metabolic encephalopathy: Secondary | ICD-10-CM | POA: Diagnosis present

## 2016-05-24 DIAGNOSIS — F419 Anxiety disorder, unspecified: Secondary | ICD-10-CM | POA: Diagnosis present

## 2016-05-24 DIAGNOSIS — A4901 Methicillin susceptible Staphylococcus aureus infection, unspecified site: Secondary | ICD-10-CM | POA: Diagnosis not present

## 2016-05-24 DIAGNOSIS — J9690 Respiratory failure, unspecified, unspecified whether with hypoxia or hypercapnia: Secondary | ICD-10-CM

## 2016-05-24 DIAGNOSIS — B9561 Methicillin susceptible Staphylococcus aureus infection as the cause of diseases classified elsewhere: Secondary | ICD-10-CM | POA: Diagnosis present

## 2016-05-24 DIAGNOSIS — Z515 Encounter for palliative care: Secondary | ICD-10-CM | POA: Diagnosis not present

## 2016-05-24 DIAGNOSIS — D62 Acute posthemorrhagic anemia: Secondary | ICD-10-CM | POA: Diagnosis not present

## 2016-05-24 DIAGNOSIS — E785 Hyperlipidemia, unspecified: Secondary | ICD-10-CM

## 2016-05-24 DIAGNOSIS — R197 Diarrhea, unspecified: Secondary | ICD-10-CM | POA: Diagnosis not present

## 2016-05-24 DIAGNOSIS — Z87891 Personal history of nicotine dependence: Secondary | ICD-10-CM | POA: Diagnosis not present

## 2016-05-24 DIAGNOSIS — R911 Solitary pulmonary nodule: Secondary | ICD-10-CM | POA: Diagnosis present

## 2016-05-24 DIAGNOSIS — E782 Mixed hyperlipidemia: Secondary | ICD-10-CM | POA: Diagnosis present

## 2016-05-24 DIAGNOSIS — J9601 Acute respiratory failure with hypoxia: Secondary | ICD-10-CM | POA: Diagnosis present

## 2016-05-24 DIAGNOSIS — Z9911 Dependence on respirator [ventilator] status: Secondary | ICD-10-CM | POA: Diagnosis not present

## 2016-05-24 DIAGNOSIS — J690 Pneumonitis due to inhalation of food and vomit: Secondary | ICD-10-CM | POA: Diagnosis not present

## 2016-05-24 DIAGNOSIS — Z09 Encounter for follow-up examination after completed treatment for conditions other than malignant neoplasm: Secondary | ICD-10-CM

## 2016-05-24 HISTORY — DX: ST elevation (STEMI) myocardial infarction involving left circumflex coronary artery: I21.21

## 2016-05-24 HISTORY — DX: Dilated cardiomyopathy: I42.0

## 2016-05-24 HISTORY — DX: Atherosclerotic heart disease of native coronary artery with unspecified angina pectoris: I25.119

## 2016-05-24 HISTORY — PX: CARDIAC CATHETERIZATION: SHX172

## 2016-05-24 HISTORY — DX: Presence of coronary angioplasty implant and graft: Z95.5

## 2016-05-24 HISTORY — DX: Cardiac arrest, cause unspecified: I46.9

## 2016-05-24 HISTORY — DX: Chronic combined systolic (congestive) and diastolic (congestive) heart failure: I50.42

## 2016-05-24 LAB — URINALYSIS, ROUTINE W REFLEX MICROSCOPIC
BILIRUBIN URINE: NEGATIVE
Glucose, UA: >=500 mg/dL — AB
HGB URINE DIPSTICK: NEGATIVE
Ketones, ur: NEGATIVE mg/dL
LEUKOCYTES UA: NEGATIVE
Nitrite: NEGATIVE
PROTEIN: 100 mg/dL — AB
SQUAMOUS EPITHELIAL / LPF: NONE SEEN
Specific Gravity, Urine: 1.01 (ref 1.005–1.030)
pH: 7 (ref 5.0–8.0)

## 2016-05-24 LAB — GLUCOSE, CAPILLARY
Glucose-Capillary: 204 mg/dL — ABNORMAL HIGH (ref 65–99)
Glucose-Capillary: 207 mg/dL — ABNORMAL HIGH (ref 65–99)

## 2016-05-24 LAB — COMPREHENSIVE METABOLIC PANEL
ALT: 166 U/L — ABNORMAL HIGH (ref 17–63)
AST: 115 U/L — AB (ref 15–41)
Albumin: 3.5 g/dL (ref 3.5–5.0)
Alkaline Phosphatase: 53 U/L (ref 38–126)
Anion gap: 11 (ref 5–15)
BUN: 30 mg/dL — AB (ref 6–20)
CHLORIDE: 105 mmol/L (ref 101–111)
CO2: 22 mmol/L (ref 22–32)
Calcium: 8.8 mg/dL — ABNORMAL LOW (ref 8.9–10.3)
Creatinine, Ser: 1.43 mg/dL — ABNORMAL HIGH (ref 0.61–1.24)
GFR, EST NON AFRICAN AMERICAN: 56 mL/min — AB (ref >60)
Glucose, Bld: 277 mg/dL — ABNORMAL HIGH (ref 65–99)
POTASSIUM: 5 mmol/L (ref 3.5–5.1)
Sodium: 138 mmol/L (ref 135–145)
Total Bilirubin: 0.6 mg/dL (ref 0.3–1.2)
Total Protein: 6.3 g/dL — ABNORMAL LOW (ref 6.5–8.1)

## 2016-05-24 LAB — CBC
HEMATOCRIT: 38.8 % — AB (ref 39.0–52.0)
HEMOGLOBIN: 12.9 g/dL — AB (ref 13.0–17.0)
MCH: 28.2 pg (ref 26.0–34.0)
MCHC: 33.2 g/dL (ref 30.0–36.0)
MCV: 84.9 fL (ref 78.0–100.0)
Platelets: 248 10*3/uL (ref 150–400)
RBC: 4.57 MIL/uL (ref 4.22–5.81)
RDW: 12.9 % (ref 11.5–15.5)
WBC: 10.4 10*3/uL (ref 4.0–10.5)

## 2016-05-24 LAB — BLOOD GAS, ARTERIAL
Acid-base deficit: 0.4 mmol/L (ref 0.0–2.0)
BICARBONATE: 25.3 mmol/L (ref 20.0–28.0)
Drawn by: 43707
FIO2: 100
LHR: 18 {breaths}/min
O2 Saturation: 99.3 %
PATIENT TEMPERATURE: 96.4
PCO2 ART: 49.9 mmHg — AB (ref 32.0–48.0)
PEEP: 5 cmH2O
PO2 ART: 481 mmHg — AB (ref 83.0–108.0)
VT: 570 mL
pH, Arterial: 7.317 — ABNORMAL LOW (ref 7.350–7.450)

## 2016-05-24 LAB — DIFFERENTIAL
BASOS ABS: 0 10*3/uL (ref 0.0–0.1)
Basophils Relative: 0 %
EOS PCT: 2 %
Eosinophils Absolute: 0.2 10*3/uL (ref 0.0–0.7)
LYMPHS ABS: 3.9 10*3/uL (ref 0.7–4.0)
Lymphocytes Relative: 37 %
MONO ABS: 0.5 10*3/uL (ref 0.1–1.0)
MONOS PCT: 5 %
Neutro Abs: 5.8 10*3/uL (ref 1.7–7.7)
Neutrophils Relative %: 56 %

## 2016-05-24 LAB — CREATININE, SERUM
CREATININE: 1.1 mg/dL (ref 0.61–1.24)
GFR calc Af Amer: >60 mL/min (ref >60)
GFR calc non Af Amer: >60 mL/min (ref >60)

## 2016-05-24 LAB — PROTIME-INR
INR: 1.07
Prothrombin Time: 13.9 seconds (ref 11.4–15.2)

## 2016-05-24 LAB — LIPID PANEL
CHOL/HDL RATIO: 4.7 ratio
Cholesterol: 109 mg/dL (ref 0–200)
HDL: 23 mg/dL — AB (ref >40)
LDL Cholesterol: 58 mg/dL (ref 0–99)
TRIGLYCERIDES: 139 mg/dL (ref <150)
VLDL: 28 mg/dL (ref 0–40)

## 2016-05-24 LAB — I-STAT CHEM 8, ED
BUN: 37 mg/dL — AB (ref 6–20)
CREATININE: 1.5 mg/dL — AB (ref 0.61–1.24)
Calcium, Ion: 1.08 mmol/L — ABNORMAL LOW (ref 1.15–1.40)
Chloride: 104 mmol/L (ref 101–111)
GLUCOSE: 274 mg/dL — AB (ref 65–99)
HCT: 37 % — ABNORMAL LOW (ref 39.0–52.0)
Hemoglobin: 12.6 g/dL — ABNORMAL LOW (ref 13.0–17.0)
Potassium: 4.9 mmol/L (ref 3.5–5.1)
Sodium: 140 mmol/L (ref 135–145)
TCO2: 27 mmol/L (ref 0–100)

## 2016-05-24 LAB — I-STAT TROPONIN, ED: Troponin i, poc: 0.25 ng/mL (ref 0.00–0.08)

## 2016-05-24 LAB — APTT: APTT: 26 s (ref 24–36)

## 2016-05-24 LAB — TROPONIN I
TROPONIN I: 0.26 ng/mL — AB (ref <0.03)
TROPONIN I: 0.44 ng/mL — AB (ref <0.03)

## 2016-05-24 SURGERY — LEFT HEART CATH AND CORONARY ANGIOGRAPHY
Anesthesia: LOCAL

## 2016-05-24 MED ORDER — AMIODARONE HCL IN DEXTROSE 360-4.14 MG/200ML-% IV SOLN
INTRAVENOUS | Status: AC
  Administered 2016-05-24: 60 mg/h
  Filled 2016-05-24: qty 200

## 2016-05-24 MED ORDER — SODIUM CHLORIDE 0.9 % IV SOLN
250.0000 mL | INTRAVENOUS | Status: DC | PRN
Start: 2016-05-24 — End: 2016-05-30

## 2016-05-24 MED ORDER — SODIUM CHLORIDE 0.9 % IV SOLN
INTRAVENOUS | Status: AC | PRN
  Administered 2016-05-24: 1000 mL via INTRAVENOUS

## 2016-05-24 MED ORDER — SODIUM CHLORIDE 0.9 % IV SOLN
1000.0000 mg | Freq: Once | INTRAVENOUS | Status: AC
  Administered 2016-05-24: 1000 mg via INTRAVENOUS
  Filled 2016-05-24: qty 10

## 2016-05-24 MED ORDER — CARVEDILOL 6.25 MG PO TABS
6.2500 mg | ORAL_TABLET | Freq: Two times a day (BID) | ORAL | Status: DC
  Administered 2016-05-26 – 2016-05-29 (×7): 6.25 mg
  Filled 2016-05-24 (×7): qty 1

## 2016-05-24 MED ORDER — BIVALIRUDIN 250 MG IV SOLR
INTRAVENOUS | Status: AC
  Filled 2016-05-24: qty 250

## 2016-05-24 MED ORDER — FENTANYL CITRATE (PF) 100 MCG/2ML IJ SOLN: 50.0000 ug | Freq: Once | INTRAMUSCULAR | Status: DC

## 2016-05-24 MED ORDER — TICAGRELOR 90 MG PO TABS
ORAL_TABLET | ORAL | Status: DC | PRN
  Administered 2016-05-24: 180 mg

## 2016-05-24 MED ORDER — PROPOFOL 1000 MG/100ML IV EMUL
5.0000 ug/kg/min | INTRAVENOUS | Status: DC
  Administered 2016-05-24: 5 ug/kg/min via INTRAVENOUS
  Administered 2016-05-24 – 2016-05-26 (×8): 40 ug/kg/min via INTRAVENOUS
  Administered 2016-05-29: 5 ug/kg/min via INTRAVENOUS
  Administered 2016-05-29: 12 ug/kg/min via INTRAVENOUS
  Administered 2016-05-30: 15 ug/kg/min via INTRAVENOUS
  Administered 2016-05-30: 5 ug/kg/min via INTRAVENOUS
  Administered 2016-05-31 – 2016-06-01 (×2): 15 ug/kg/min via INTRAVENOUS
  Filled 2016-05-24 (×4): qty 100
  Filled 2016-05-24: qty 200
  Filled 2016-05-24 (×2): qty 100
  Filled 2016-05-24: qty 200
  Filled 2016-05-24 (×5): qty 100

## 2016-05-24 MED ORDER — ETOMIDATE 2 MG/ML IV SOLN
INTRAVENOUS | Status: AC | PRN
  Administered 2016-05-24: 20 mg via INTRAVENOUS

## 2016-05-24 MED ORDER — FAMOTIDINE IN NACL 20-0.9 MG/50ML-% IV SOLN: 20.0000 mg | INTRAVENOUS | Status: DC

## 2016-05-24 MED ORDER — SODIUM CHLORIDE 0.9 % IV SOLN
INTRAVENOUS | Status: DC | PRN
  Administered 2016-05-24: 100 mL/h via INTRAVENOUS

## 2016-05-24 MED ORDER — SODIUM CHLORIDE 0.9 % IV SOLN: INTRAVENOUS | Status: DC | PRN

## 2016-05-24 MED ORDER — SODIUM BICARBONATE 8.4 % IV SOLN
INTRAVENOUS | Status: AC
  Filled 2016-05-24: qty 50

## 2016-05-24 MED ORDER — SODIUM CHLORIDE 0.9 % IV SOLN
25.0000 ug/h | INTRAVENOUS | Status: DC
  Administered 2016-05-24 (×3): 50 ug/h via INTRAVENOUS
  Administered 2016-05-26 – 2016-05-27 (×2): 150 ug/h via INTRAVENOUS
  Administered 2016-05-28: 200 ug/h via INTRAVENOUS
  Filled 2016-05-24 (×6): qty 50

## 2016-05-24 MED ORDER — HEPARIN (PORCINE) IN NACL 2-0.9 UNIT/ML-% IJ SOLN
INTRAMUSCULAR | Status: AC
  Filled 2016-05-24: qty 1000

## 2016-05-24 MED ORDER — TICAGRELOR 90 MG PO TABS
ORAL_TABLET | ORAL | Status: AC
  Filled 2016-05-24: qty 2

## 2016-05-24 MED ORDER — FENTANYL CITRATE (PF) 100 MCG/2ML IJ SOLN
100.0000 ug | INTRAMUSCULAR | Status: DC | PRN
  Administered 2016-05-30: 100 ug via INTRAVENOUS
  Filled 2016-05-24 (×2): qty 2

## 2016-05-24 MED ORDER — BIVALIRUDIN BOLUS VIA INFUSION - CUPID
INTRAVENOUS | Status: DC | PRN
  Administered 2016-05-24: 79.575 mg via INTRAVENOUS

## 2016-05-24 MED ORDER — DEXTROSE 5 % IV SOLN
INTRAVENOUS | Status: AC | PRN
  Administered 2016-05-24: 150 mg via INTRAVENOUS

## 2016-05-24 MED ORDER — MIDAZOLAM HCL 2 MG/2ML IJ SOLN
2.0000 mg | INTRAMUSCULAR | Status: DC | PRN
  Administered 2016-05-24 – 2016-05-31 (×7): 2 mg via INTRAVENOUS
  Filled 2016-05-24 (×8): qty 2

## 2016-05-24 MED ORDER — CHLORHEXIDINE GLUCONATE 0.12% ORAL RINSE (MEDLINE KIT)
15.0000 mL | Freq: Two times a day (BID) | OROMUCOSAL | Status: DC
  Administered 2016-05-24 – 2016-06-01 (×16): 15 mL via OROMUCOSAL

## 2016-05-24 MED ORDER — METOPROLOL TARTRATE 5 MG/5ML IV SOLN
INTRAVENOUS | Status: DC | PRN
  Administered 2016-05-24: 5 mg via INTRAVENOUS

## 2016-05-24 MED ORDER — LABETALOL HCL 5 MG/ML IV SOLN: 10.0000 mg | INTRAVENOUS | Status: AC | PRN

## 2016-05-24 MED ORDER — FUROSEMIDE 10 MG/ML IJ SOLN
40.0000 mg | Freq: Two times a day (BID) | INTRAMUSCULAR | Status: DC
  Administered 2016-05-25 – 2016-05-26 (×3): 40 mg via INTRAVENOUS
  Filled 2016-05-24 (×3): qty 4

## 2016-05-24 MED ORDER — ATORVASTATIN CALCIUM 80 MG PO TABS
80.0000 mg | ORAL_TABLET | Freq: Every day | ORAL | Status: DC
  Administered 2016-05-25 – 2016-06-11 (×15): 80 mg
  Filled 2016-05-24 (×16): qty 1

## 2016-05-24 MED ORDER — SODIUM CHLORIDE 0.9% FLUSH
3.0000 mL | INTRAVENOUS | Status: DC | PRN
Start: 2016-05-24 — End: 2016-05-30

## 2016-05-24 MED ORDER — SODIUM CHLORIDE 0.9% FLUSH
3.0000 mL | Freq: Two times a day (BID) | INTRAVENOUS | Status: DC
  Administered 2016-05-24 – 2016-05-30 (×10): 3 mL via INTRAVENOUS

## 2016-05-24 MED ORDER — IOPAMIDOL (ISOVUE-370) INJECTION 76%
INTRAVENOUS | Status: AC
  Filled 2016-05-24: qty 100

## 2016-05-24 MED ORDER — HEPARIN SODIUM (PORCINE) 5000 UNIT/ML IJ SOLN
5000.0000 [IU] | Freq: Three times a day (TID) | INTRAMUSCULAR | Status: DC
  Administered 2016-05-24 – 2016-06-17 (×69): 5000 [IU] via SUBCUTANEOUS
  Filled 2016-05-24 (×69): qty 1

## 2016-05-24 MED ORDER — SODIUM CHLORIDE 0.9 % IV SOLN: INTRAVENOUS | Status: AC

## 2016-05-24 MED ORDER — ORAL CARE MOUTH RINSE
15.0000 mL | OROMUCOSAL | Status: DC
  Administered 2016-05-24 – 2016-06-01 (×76): 15 mL via OROMUCOSAL

## 2016-05-24 MED ORDER — ONDANSETRON HCL 4 MG/2ML IJ SOLN
4.0000 mg | Freq: Four times a day (QID) | INTRAMUSCULAR | Status: DC | PRN
  Administered 2016-05-27: 4 mg via INTRAVENOUS
  Filled 2016-05-24: qty 2

## 2016-05-24 MED ORDER — HEPARIN (PORCINE) IN NACL 2-0.9 UNIT/ML-% IJ SOLN
INTRAMUSCULAR | Status: DC | PRN
  Administered 2016-05-24: 1000 mL

## 2016-05-24 MED ORDER — ASPIRIN 300 MG RE SUPP
300.0000 mg | Freq: Once | RECTAL | Status: AC
  Administered 2016-05-24: 300 mg via RECTAL
  Filled 2016-05-24: qty 1

## 2016-05-24 MED ORDER — NOREPINEPHRINE BITARTRATE 1 MG/ML IV SOLN: 0.0000 ug/min | INTRAVENOUS | Status: DC

## 2016-05-24 MED ORDER — FUROSEMIDE 10 MG/ML IJ SOLN
INTRAMUSCULAR | Status: DC | PRN
  Administered 2016-05-24: 40 mg via INTRAVENOUS

## 2016-05-24 MED ORDER — AMIODARONE HCL IN DEXTROSE 360-4.14 MG/200ML-% IV SOLN
INTRAVENOUS | Status: AC
  Administered 2016-05-24: 19:00:00
  Filled 2016-05-24: qty 200

## 2016-05-24 MED ORDER — SODIUM CHLORIDE 0.9 % IV SOLN
500.0000 mg | Freq: Two times a day (BID) | INTRAVENOUS | Status: DC
  Administered 2016-05-25 – 2016-05-26 (×3): 500 mg via INTRAVENOUS
  Filled 2016-05-24 (×3): qty 5

## 2016-05-24 MED ORDER — ASPIRIN 81 MG PO CHEW
81.0000 mg | CHEWABLE_TABLET | Freq: Every day | ORAL | Status: DC
  Administered 2016-05-25 – 2016-06-10 (×15): 81 mg
  Filled 2016-05-24 (×15): qty 1

## 2016-05-24 MED ORDER — ACETAMINOPHEN 325 MG PO TABS
650.0000 mg | ORAL_TABLET | ORAL | Status: DC | PRN
  Administered 2016-05-29 – 2016-06-03 (×6): 650 mg via ORAL
  Filled 2016-05-24 (×7): qty 2

## 2016-05-24 MED ORDER — IOPAMIDOL (ISOVUE-370) INJECTION 76%
INTRAVENOUS | Status: DC | PRN
  Administered 2016-05-24: 135 mL via INTRA_ARTERIAL

## 2016-05-24 MED ORDER — BIVALIRUDIN 250 MG IV SOLR
INTRAVENOUS | Status: DC | PRN
  Administered 2016-05-24: 1.75 mg/kg/h via INTRAVENOUS

## 2016-05-24 MED ORDER — SODIUM CHLORIDE 0.9 % IV SOLN: INTRAVENOUS | Status: DC

## 2016-05-24 MED ORDER — FUROSEMIDE 10 MG/ML IJ SOLN
INTRAMUSCULAR | Status: AC
  Filled 2016-05-24: qty 4

## 2016-05-24 MED ORDER — LIDOCAINE HCL (PF) 1 % IJ SOLN
INTRAMUSCULAR | Status: AC
  Filled 2016-05-24: qty 30

## 2016-05-24 MED ORDER — ASPIRIN 300 MG RE SUPP: 300.0000 mg | RECTAL | Status: AC

## 2016-05-24 MED ORDER — HYDRALAZINE HCL 20 MG/ML IJ SOLN: 5.0000 mg | INTRAMUSCULAR | Status: AC | PRN

## 2016-05-24 MED ORDER — MIDAZOLAM HCL 2 MG/2ML IJ SOLN
INTRAMUSCULAR | Status: AC
  Filled 2016-05-24: qty 2

## 2016-05-24 MED ORDER — SODIUM CHLORIDE 0.9 % IV SOLN
INTRAVENOUS | Status: AC
  Administered 2016-05-24: 21:00:00 via INTRAVENOUS

## 2016-05-24 MED ORDER — SODIUM BICARBONATE 8.4 % IV SOLN
INTRAVENOUS | Status: DC | PRN
  Administered 2016-05-24: 50 meq via INTRAVENOUS

## 2016-05-24 MED ORDER — LORAZEPAM 2 MG/ML IJ SOLN
INTRAMUSCULAR | Status: AC
  Administered 2016-05-24: 2 mg
  Filled 2016-05-24: qty 1

## 2016-05-24 MED ORDER — ROCURONIUM BROMIDE 50 MG/5ML IV SOLN
INTRAVENOUS | Status: AC | PRN
  Administered 2016-05-24: 100 mg via INTRAVENOUS

## 2016-05-24 MED ORDER — LIDOCAINE HCL (PF) 1 % IJ SOLN
INTRAMUSCULAR | Status: DC | PRN
  Administered 2016-05-24: 20 mL

## 2016-05-24 MED ORDER — NOREPINEPHRINE BITARTRATE 1 MG/ML IV SOLN
0.0000 ug/min | INTRAVENOUS | Status: DC
  Administered 2016-05-24: 5 ug/min via INTRAVENOUS
  Administered 2016-05-25: 3 ug/min via INTRAVENOUS
  Administered 2016-05-25: 5 ug/min via INTRAVENOUS
  Filled 2016-05-24 (×3): qty 4

## 2016-05-24 MED ORDER — IOPAMIDOL (ISOVUE-370) INJECTION 76%
INTRAVENOUS | Status: AC
  Filled 2016-05-24: qty 125

## 2016-05-24 MED ORDER — FENTANYL CITRATE (PF) 100 MCG/2ML IJ SOLN: 100.0000 ug | INTRAMUSCULAR | Status: DC | PRN

## 2016-05-24 MED ORDER — POTASSIUM CHLORIDE CRYS ER 20 MEQ PO TBCR: 20.0000 meq | EXTENDED_RELEASE_TABLET | Freq: Every day | ORAL | Status: DC

## 2016-05-24 MED ORDER — MIDAZOLAM HCL 2 MG/2ML IJ SOLN
INTRAMUSCULAR | Status: DC | PRN
  Administered 2016-05-24 (×2): 2 mg via INTRAVENOUS

## 2016-05-24 MED ORDER — FENTANYL BOLUS VIA INFUSION
50.0000 ug | INTRAVENOUS | Status: DC | PRN
  Filled 2016-05-24: qty 50

## 2016-05-24 MED ORDER — NITROGLYCERIN 1 MG/10 ML FOR IR/CATH LAB
INTRA_ARTERIAL | Status: DC | PRN
Start: 2016-05-24 — End: 2016-05-24
  Administered 2016-05-24: 200 ug via INTRACORONARY

## 2016-05-24 MED ORDER — TICAGRELOR 90 MG PO TABS
90.0000 mg | ORAL_TABLET | Freq: Two times a day (BID) | ORAL | Status: DC
  Administered 2016-05-25 – 2016-06-06 (×25): 90 mg via ORAL
  Filled 2016-05-24 (×26): qty 1

## 2016-05-24 MED ORDER — HEPARIN SODIUM (PORCINE) 5000 UNIT/ML IJ SOLN
4000.0000 [IU] | Freq: Once | INTRAMUSCULAR | Status: AC
  Administered 2016-05-24: 4000 [IU] via INTRAVENOUS
  Filled 2016-05-24: qty 1

## 2016-05-24 SURGICAL SUPPLY — 15 items
BALLN EMERGE MR 2.0X15 (BALLOONS) ×2
BALLOON EMERGE MR 2.0X15 (BALLOONS) IMPLANT
CATH INFINITI 5FR MULTPACK ANG (CATHETERS) ×1 IMPLANT
CATH SITESEER 5F NTR (CATHETERS) ×1 IMPLANT
CATH VISTA GUIDE 6FR XB3.5 (CATHETERS) ×1 IMPLANT
KIT ENCORE 26 ADVANTAGE (KITS) ×1 IMPLANT
KIT HEART LEFT (KITS) ×2 IMPLANT
PACK CARDIAC CATHETERIZATION (CUSTOM PROCEDURE TRAY) ×2 IMPLANT
SHEATH PINNACLE 6F 10CM (SHEATH) ×1 IMPLANT
STENT PROMUS PREM MR 2.5X20 (Permanent Stent) ×1 IMPLANT
TRANSDUCER W/STOPCOCK (MISCELLANEOUS) ×2 IMPLANT
TUBING CIL FLEX 10 FLL-RA (TUBING) ×2 IMPLANT
WIRE ASAHI PROWATER 180CM (WIRE) ×1 IMPLANT
WIRE EMERALD 3MM-J .035X150CM (WIRE) ×1 IMPLANT
WIRE LUGE 182CM (WIRE) ×1 IMPLANT

## 2016-05-24 NOTE — ED Provider Notes (Signed)
[facility]-EMERGENCY DEPT Provider Note   CSN: [id number] Arrival date & time: 05/24/16  1529     History   Chief Complaint Chief Complaint  Patient presents with  . Cardiac Arrest  . Code STEMI    HPI Douglas Carr is a 51 y.o. male.  HPI 50yoM with hx of CAD s/p STEMI on 05/07/16 in the L circumflex coronary artery that had stent placed in the second obtuse marginal with diffuse residual disease presenting as a post arrest. Per EMS and discussion with the family, He Is shortness of breath and shaking however he was talking during this event. They sent him on and then his eyes rolled back in his mouth is wide open and was unresponsive. The family pulled him off the chair onto the floor and started chest compressions. First responder arrived with an AED which delivered one shock. He regained pulses after 1 defib. He is maintaining his own airway and pulses in route. He is still unresponsive and his respirations are assisted with [facility].  Past Medical History:  Diagnosis Date  . Acute ST elevation myocardial infarction (STEMI) involving left circumflex coronary artery (HCC) 05/07/2016   PCI to Cx-OM  . Anginal pain (HCC)    secondary to sm. vessel disease  . Anxiety   . Arthritis    BIL KNEE PAIN AND BIL ANKLE PAIN  . Bone spur of ankle   . 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 Coronary artery disease involving native coronary artery of native heart with angina pectoris (HCC) 05/24/2016   Remote MI at 51 years of age, Last cath 2010-diffuse non-obstructive disease; last echo 06/16/08 -normal LV function, moderate concentric hypertrophy; nuc 08/2008 no ischemia;  medical therapy; STEMI May 07 2016 - PCI to Cx-OM  . Diabetes mellitus    ON ORAL MEDICATION AND INSULIN  . Dilated cardiomyopathy (HCC) 05/24/2016   EF 325-30% by Echo post STEMI (previously 30-35%)   . Hyperlipidemia   . Hypertension   . Myocardial infarction 1996    Post MI  . Peripheral vascular disease (HCC)    HAS LEFT CAROTID ARTERY STENOSIS   AND IS S/P RIGHT CAROTID ENDARTERECTOMY 2010 Last carotid dopplers 01/08/2012 wth patent endarterectomy site  . Status post coronary artery stent placement     Patient Active Problem List   Diagnosis Date Noted  . Cardiac arrest with ventricular fibrillation (HCC) 05/24/2016  . Dilated cardiomyopathy (HCC) 05/24/2016  . Coronary artery disease involving native coronary artery of native heart with angina pectoris (HCC) 05/24/2016  . 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 Status post coronary artery stent placement   . Acute on chronic systolic heart failure (HCC)   . Acute ST elevation myocardial infarction (STEMI) involving left circumflex coronary artery (HCC) 05/07/2016  . Essential hypertension 09/21/2013  . Diabetes mellitus (HCC) 09/21/2013  . Combined hyperlipidemia associated with type 2 diabetes mellitus 09/21/2013  . Pulmonary nodules 10/16/2011    Past Surgical History:  Procedure Laterality Date  . APPENDECTOMY    . CARDIAC CATHETERIZATION     FEB 2010, significant branch vessel disease wth diag, marginal, PDA & PLA, nml. LV function  . CARDIAC CATHETERIZATION N/A 05/07/2016   Procedure: Left Heart Cath and Coronary Angiography;  Surgeon: Peter M Swaziland, MD;  Location: [facility] INVASIVE CV LAB;  Service: Cardiovascular;  Laterality: N/A;  . CARDIAC CATHETERIZATION N/A 05/07/2016  Procedure: Coronary Stent Intervention;  Surgeon: Peter M Swaziland, MD;  Location: [facility] INVASIVE CV LAB;  Service: Cardiovascular;  Laterality: N/A;  . CAROTID ENDARTERECTOMY  09/2008   Rt CEA  . LUNG BIOPSY  2013   Bx's suggest granulomatous dz.  . RT ANKLE   2013       Home Medications    Prior to Admission medications   Medication Sig Start Date End Date Taking? Authorizing Provider  acetaminophen (TYLENOL) 325 MG tablet Take 2 tablets (650 mg total) by  mouth every 4 (four) hours as needed for headache or mild pain. 05/11/16   Abelino Derrick, PA-C  aspirin 81 MG chewable tablet Chew 1 tablet (81 mg total) by mouth daily. 05/12/16   Abelino Derrick, PA-C  atorvastatin (LIPITOR) 80 MG tablet Take 1 tablet (80 mg total) by mouth daily at 6 PM. 05/11/16   Abelino Derrick, PA-C  carvedilol (COREG) 6.25 MG tablet Take 1 tablet (6.25 mg total) by mouth 2 (two) times daily with a meal. 05/11/16   Abelino Derrick, PA-C  insulin aspart (NOVOLOG FLEXPEN) 100 UNIT/ML FlexPen Inject 6 to 10 units 3 times a day prior to each meal and as needed for blood sugar greater than 200 Patient taking differently: Inject 0-10 Units into the skin 3 (three) times daily as needed for high blood sugar. as needed for blood sugar greater than 200 11/14/15   [name], MD  Insulin Degludec (TRESIBA FLEXTOUCH) 200 UNIT/ML SOPN Inject 160 Units into the skin daily. 11/15/15   [name], MD  isosorbide mononitrate (IMDUR) 30 MG 24 hr tablet Take 1 tablet (30 mg total) by mouth daily. 05/12/16   Abelino Derrick, PA-C  losartan (COZAAR) 100 MG tablet Take 1 tablet (100 mg total) by mouth daily. 05/12/16   Abelino Derrick, PA-C  nitroGLYCERIN (NITROSTAT) 0.4 MG SL tablet Place 1 tablet (0.4 mg total) under the tongue every 5 (five) minutes x 3 doses as needed for chest pain. 05/11/16   Abelino Derrick, PA-C  potassium chloride SA (K-DUR,KLOR-CON) 20 MEQ tablet Take 1 tablet (20 mEq total) by mouth daily. 05/12/16   Abelino Derrick, PA-C  spironolactone (ALDACTONE) 25 MG tablet Take 0.5 tablets (12.5 mg total) by mouth daily. 05/12/16   Abelino Derrick, PA-C  ticagrelor (BRILINTA) 90 MG TABS tablet Take 1 tablet (90 mg total) by mouth 2 (two) times daily. 05/11/16   Abelino Derrick, PA-C  torsemide (DEMADEX) 20 MG tablet Take 1 tablet (20 mg total) by mouth 2 (two) times daily. 05/15/16   Berton Bon, NP    Family History Family History  Problem Relation Age of Onset  . Hypertension Mother   . Diabetes  Mother   . Hypertension Maternal Grandfather   . Heart attack Paternal Grandfather   . Heart attack Father 96    Social History Social History  Substance Use Topics  . Smoking status: Former Smoker    Packs/day: 2.00    Years: 22.00    Types: Cigarettes    Quit date: 10/23/2011  . Smokeless tobacco: Never Used  . Alcohol use 0.0 oz/week    2 - 3 Cans of beer per week     Comment: occasional     Allergies   Patient has no known allergies.   Review of Systems Review of Systems  Unable to perform ROS: Acuity of condition     Physical Exam Updated Vital Signs BP 132/90  Pulse 79   Temp 98.1 F (36.7 C) (Rectal)   Resp 15   Ht 5\' 9"  (1.753 m)   Wt 106.1 kg   SpO2 100%   BMI 34.56 kg/m   Physical Exam  HENT:  Head: Normocephalic and atraumatic.  Mouth/Throat: Oropharynx is clear and moist and mucous membranes are normal.  Eyes: Conjunctivae are normal. Pupils are equal, round, and reactive to light.  Neck: Neck supple. No tracheal deviation present.  Cardiovascular: Regular rhythm, S1 normal, S2 normal, normal heart sounds and intact distal pulses.  Tachycardia present.  Exam reveals no friction rub.   No murmur heard. Pulses:      Carotid pulses are 1+ on the right side, and 1+ on the left side.      Radial pulses are 1+ on the right side, and 1+ on the left side.  Pulmonary/Chest: Accessory muscle usage present. Tachypnea noted. He has no decreased breath sounds. He has no rhonchi.  Abdominal: Soft. He exhibits no distension.  Musculoskeletal: He exhibits no deformity.  Neurological: He is unresponsive.  Skin: Skin is warm and dry. He is not diaphoretic.  Nursing note and vitals reviewed.    ED Treatments / Results  Labs (all labs ordered are listed, but only abnormal results are displayed) Labs Reviewed  CBC - Abnormal; Notable for the following:       Result Value   Hemoglobin 12.9 (*)    HCT 38.8 (*)    All other components within normal limits    COMPREHENSIVE METABOLIC PANEL - Abnormal; Notable for the following:    Glucose, Bld 277 (*)    BUN 30 (*)    Creatinine, Ser 1.43 (*)    Calcium 8.8 (*)    Total Protein 6.3 (*)    AST 115 (*)    ALT 166 (*)    GFR calc non Af Amer 56 (*)    All other components within normal limits  TROPONIN I - Abnormal; Notable for the following:    Troponin I 0.26 (*)    All other components within normal limits  LIPID PANEL - Abnormal; Notable for the following:    HDL 23 (*)    All other components within normal limits  URINALYSIS, ROUTINE W REFLEX MICROSCOPIC - Abnormal; Notable for the following:    Glucose, UA >=500 (*)    Protein, ur 100 (*)    Bacteria, UA RARE (*)    All other components within normal limits  I-STAT CHEM 8, ED - Abnormal; Notable for the following:    BUN 37 (*)    Creatinine, Ser 1.50 (*)    Glucose, Bld 274 (*)    Calcium, Ion 1.08 (*)    Hemoglobin 12.6 (*)    HCT 37.0 (*)    All other components within normal limits  I-STAT TROPOININ, ED - Abnormal; Notable for the following:    Troponin i, poc 0.25 (*)    All other components within normal limits  DIFFERENTIAL  PROTIME-INR  APTT  BLOOD GAS, ARTERIAL  BLOOD GAS, ARTERIAL    EKG  EKG Interpretation None       Radiology Dg Chest Portable 1 View  Result Date: 05/24/2016 CLINICAL DATA:  Status post intubation. EXAM: PORTABLE CHEST 1 VIEW COMPARISON:  05/12/2016 FINDINGS: Endotracheal tube has been placed and terminates approximately 4 cm above the carina. Enteric tube courses into the left upper abdomen with tip not imaged. Cardiac silhouette is accentuated by portable AP technique and low lung  volumes. Diffuse interstitial densities bilaterally have mildly increased. No large pleural effusion or pneumothorax is identified. Calcified granulomas are again seen in the right lung. IMPRESSION: 1. Endotracheal tube in satisfactory position. 2. Worsening bilateral interstitial opacities which may reflect  edema or infection. Electronically Signed   By: Sebastian Ache M.D.   On: 05/24/2016 16:44    Procedures Procedure Name: Intubation Date/Time: 05/24/2016 5:52 PM Performed by: Odus Clasby [place] Pre-anesthesia Checklist: Patient identified, Emergency Drugs available, Suction available, Patient being monitored and Timeout performed Oxygen Delivery Method: Non-rebreather mask Preoxygenation: Pre-oxygenation with 100% oxygen Intubation Type: Rapid sequence Laryngoscope Size: Glidescope Grade View: Grade III Tube size: 8.0 mm Number of attempts: 1 Airway Equipment and Method: Stylet,  Rigid stylet and Video-laryngoscopy Placement Confirmation: ETT inserted through vocal cords under direct vision,  CO2 detector and Breath sounds checked- equal and bilateral Secured at: 26 cm Tube secured with: ETT holder      (including critical care time)  Medications Ordered in ED Medications  fentaNYL (SUBLIMAZE) injection 100 mcg ( Intravenous MAR Hold 05/24/16 1620)  fentaNYL (SUBLIMAZE) 2,500 mcg in sodium chloride 0.9 % 250 mL (10 mcg/mL) infusion (100 mcg/hr Intravenous Rate/Dose Change 05/24/16 1719)  fentaNYL (SUBLIMAZE) bolus via infusion 50 mcg ( Intravenous MAR Hold 05/24/16 1620)  amiodarone (NEXTERONE PREMIX) 360-4.14 MG/200ML-% (1.8 mg/mL) IV infusion (not administered)  aspirin suppository 300 mg ( Rectal Automatically Held 05/24/16 1630)  0.9 %  sodium chloride infusion (not administered)  midazolam (VERSED) injection 2 mg (not administered)  heparin infusion 2 units/mL in 0.9 % sodium chloride (1,000 mLs Other New Bag/Given 05/24/16 1639)  lidocaine (PF) (XYLOCAINE) 1 % injection (20 mLs Other Given 05/24/16 1640)  metoprolol (LOPRESSOR) injection (5 mg Intravenous Given 05/24/16 1644)  sodium bicarbonate injection (50 mEq Intravenous Given 05/24/16 1649)  bivalirudin (ANGIOMAX) BOLUS via infusion (79.575 mg Intravenous Given 05/24/16 1654)  bivalirudin (ANGIOMAX) 250 mg in sodium chloride 0.9  % 50 mL (5 mg/mL) infusion (1.75 mg/kg/hr  106.1 kg Intravenous New Bag/Given 05/24/16 1656)  nitroGLYCERIN 1 mg/10 ml (100 mcg/ml) - IR/CATH LAB (200 mcg Intracoronary Given 05/24/16 1704)  0.9 %  sodium chloride infusion ( Intravenous Stopped 05/24/16 1729)  furosemide (LASIX) injection (40 mg Intravenous Given 05/24/16 1725)  iopamidol (ISOVUE-370) 76 % injection (135 mLs Intra-arterial Given 05/24/16 1726)  midazolam (VERSED) injection (2 mg Intravenous Given 05/24/16 1655)  ticagrelor (BRILINTA) tablet (180 mg Per Tube Given 05/24/16 1736)  heparin injection 4,000 Units (4,000 Units Intravenous Given 05/24/16 1543)  aspirin suppository 300 mg (300 mg Rectal Given 05/24/16 1542)  etomidate (AMIDATE) injection ( Intravenous MAR Hold 05/24/16 1620)  rocuronium (ZEMURON) injection ( Intravenous MAR Hold 05/24/16 1620)  0.9 %  sodium chloride infusion (1,000 mLs Intravenous New Bag/Given 05/24/16 1548)  amiodarone (CORDARONE) 150 mg in dextrose 5 % 100 mL bolus (150 mg Intravenous New Bag/Given 05/24/16 1614)     Initial Impression / Assessment and Plan / ED Course  I have reviewed the triage vital signs and the nursing notes.  Pertinent labs & imaging results that were available during my care of the patient were reviewed by me and considered in my medical decision making (see chart for details).     50yoM with hx of CAD with recent STEMI presenting as post arrest. Pt unresponsive however HDS with b/l radial palpable pulses. Agonal breathing noted, but O2 sat 100%. Pt intubated for airway protection due to agonal breathing and unresponsiveness. EKG noted with STEMI in anterior leads with  recirpocal changes in inferior leads. CODE STEMI called. Rectal ASA and IV heparin given.  Cardiology at bedside. Pt noted to be in Afib, given bolus of amio. Will be taken to cath lab then to ICU for further management.   Patient care discussed and supervised by my attending, Dr. Jodi Mourning. Azalia Bilis, MD   Final  Clinical Impressions(s) / ED Diagnoses   Final diagnoses:  Cardiac arrest [facility])    New Prescriptions Current Discharge Medication List       Nakiyah Beverley [place] Abigale Dorow, MD 05/24/16 1757    Blane Ohara, MD 05/26/16 [phone]

## 2016-05-24 NOTE — Progress Notes (Signed)
Pt transported on vent from cath lab to 4N20. Vitals remained stable throughout.

## 2016-05-24 NOTE — Code Documentation (Signed)
Artic sun pads and machine taken with patient to the cath lab.

## 2016-05-24 NOTE — Interval H&P Note (Signed)
History and Physical Interval Note:  05/24/2016 5:36 PM  Douglas Carr  has presented today for surgery, with the diagnosis of acute non-STEMI/cardiac arrest with VT.  The various methods of treatment have been discussed with the patient and family. After consideration of risks, benefits and other options for treatment, the patient has consented to  Procedure(s) with comments: Left Heart Cath and Coronary Angiography (N/A) Coronary Stent Intervention (N/A) as a surgical intervention .  The patient's history has been reviewed, patient examined, no change in status, stable for surgery.  I have reviewed the patient's chart and labs.  Questions were answered to the patient's satisfaction.     [name]

## 2016-05-24 NOTE — Progress Notes (Signed)
STAT LTM started 

## 2016-05-24 NOTE — Consult Note (Signed)
Neurology Consultation Reason for Consult: Concern for seizures Referring Physician: Pasty Arch  CC: Seizures  History is obtained from: Medical record, nursing  HPI: Douglas Carr is a 51 y.o. male who presented with cardiac arrest on 1/20. He was treated with normothermia protocol. He was found unresponsive, and pulseless. CPR was started by family member AED was placed and was given a shock with return of spontaneous circulation. He was taken for a cath for is an STEMI. His been admitted to the cardiac intensive care unit.   After arrival to the intensive care unit he was seen to have a seizure lasting less than 5 minutes. The nurse describes bilateral stiff hands with shaking activity. This was persistent, with eyes open, rather than intermittent as would be expected with myoclonus. He was given Versed with cessation of seizure activity.  He has remained comatose.  ROS: A 14 point ROS was performed and is negative except as noted in the HPI.   Past Medical History:  Diagnosis Date  . Acute ST elevation myocardial infarction (STEMI) involving left circumflex coronary artery (HCC) 05/07/2016   PCI to Cx-OM  . Anginal pain (HCC)    secondary to sm. vessel disease  . Anxiety   . Arthritis    BIL KNEE PAIN AND BIL ANKLE PAIN  . Bone spur of ankle   . 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 Coronary artery disease involving native coronary artery of native heart with angina pectoris (HCC) 05/24/2016   Remote MI at 50 years of age, Last cath 2010-diffuse non-obstructive disease; last echo 06/16/08 -normal LV function, moderate concentric hypertrophy; nuc 08/2008 no ischemia;  medical therapy; STEMI May 07 2016 - PCI to Cx-OM  . Diabetes mellitus    ON ORAL MEDICATION AND INSULIN  . Dilated cardiomyopathy (HCC) 05/24/2016   EF 325-30% by Echo post STEMI (previously 30-35%)   . Hyperlipidemia   . Hypertension   . Myocardial  infarction 1996   Post MI  . Peripheral vascular disease (HCC)    HAS LEFT CAROTID ARTERY STENOSIS   AND IS S/P RIGHT CAROTID ENDARTERECTOMY 2010 Last carotid dopplers 01/08/2012 wth patent endarterectomy site  . Status post coronary artery stent placement      Family History  Problem Relation Age of Onset  . Hypertension Mother   . Diabetes Mother   . Hypertension Maternal Grandfather   . Heart attack Paternal Grandfather   . Heart attack Father 43     Social History:  reports that he quit smoking about 4 years ago. His smoking use included Cigarettes. He has a 44.00 pack-year smoking history. He has never used smokeless tobacco. He reports that he drinks alcohol. He reports that he does not use drugs.   Exam: Current vital signs: BP (!) 92/43   Pulse (!) 59   Temp (!) 96.6 F (35.9 C) (Core (Comment))   Resp 18   Ht 5\' 9"  (1.753 m)   Wt 106.2 kg (234 lb 2.1 oz)   SpO2 95%   BMI 34.57 kg/m  Vital signs in last 24 hours: Temp:  [95.9 F (35.5 C)-98.1 F (36.7 C)] 96.6 F (35.9 C) (01/20 2014) Pulse Rate:  [0-140] 59 (01/20 2300) Resp:  [0-45] 18 (01/20 2300) BP: (92-217)/(43-138) 92/43 (01/20 2014) SpO2:  [0 %-100 %] 95 % (01/20 2300) Arterial Line BP: (85-171)/(37-101) 85/37 (01/20 2300) FiO2 (%):  [50 %-100 %] 50 % (01/20 2014) Weight:  [  106.1 kg (234 lb)-106.2 kg (234 lb 2.1 oz)] 106.2 kg (234 lb 2.1 oz) (01/20 1806)   Physical Exam  Constitutional: Appears well-developed and well-nourished.  Psych: Unresponsive Eyes: No scleral injection HENT: ET tube in place Head: Normocephalic.  Cardiovascular: Normal rate and regular rhythm.  Respiratory: Ventilated GI: Soft.  No distension. There is no tenderness.  Skin: WDI  Neuro: Mental Status: Patient is unresponsive, does not open eyes to noxious stimuli or follow commands. Cranial Nerves: II: Does not blink to threat Pupils are equal, round, and reactive to light.   III,IV, VI: Doll's eye negative V: VII:  Corneals intact on left, none seen on right. Motor: He has no response to noxious stimulation in the upper extremities, minimal flexion in lower extremities Sensory: As above Cerebellar: Does not perform  I have reviewed labs in epic and the results pertinent to this consultation are: CMP-creatinine 1.4, mildly elevated liver enzymes  I have reviewed the images obtained: CT head-no acute findings  Impression: 51 year old male with episode concern for seizure status post cardiac arrest. He has been loaded with Keppra and started on this as a maintenance. I asked for the EEG tech commenting connect the patient, no active ongoing seizures.  Recommendations: 1) continue Keppra 500 mg twice a day 2) continue EEG for now 3) neurology will continue to follow   Ritta SlotMcNeill Lyrika Souders, MD [facility] [phone]  If 7pm- 7am, please page neurology on call as listed in AMION.

## 2016-05-24 NOTE — H&P (View-Only) (Signed)
Cardiology Consult    Patient ID: Douglas Carr MRN: [id number], DOB/AGE: [birth year]   Admit date: 05/24/2016 Date of Consult: 05/24/2016  Primary Physician: [name], MD Primary Cardiologist: Dr. Allyson Sabal Requesting Provider: Dr. Jodi Mourning Reason for Consultation: Cardiac Arrest  Patient Profile    51 yo male with PMH of chronic combined HF (25-30%), HLD R-CEA 2010, DM2, STEMI w/ DES OM2 05/07/2016 who presented to the ED as a post arrest.   Past Medical History   Past Medical History:  Diagnosis Date  . Acute ST elevation myocardial infarction (STEMI) involving left circumflex coronary artery (HCC) 05/07/2016   PCI to Cx-OM  . Anginal pain (HCC)    secondary to sm. vessel disease  . Anxiety   . Arthritis    BIL KNEE PAIN AND BIL ANKLE PAIN  . Bone spur of ankle   . 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 Coronary artery disease involving native coronary artery of native heart with angina pectoris (HCC) 05/24/2016   Remote MI at 51 years of age, Last cath 2010-diffuse non-obstructive disease; last echo 06/16/08 -normal LV function, moderate concentric hypertrophy; nuc 08/2008 no ischemia;  medical therapy; STEMI May 07 2016 - PCI to Cx-OM  . Diabetes mellitus    ON ORAL MEDICATION AND INSULIN  . Dilated cardiomyopathy (HCC) 05/24/2016   EF 325-30% by Echo post STEMI (previously 30-35%)   . Hyperlipidemia   . Hypertension   . Myocardial infarction 1996   Post MI  . Peripheral vascular disease (HCC)    HAS LEFT CAROTID ARTERY STENOSIS   AND IS S/P RIGHT CAROTID ENDARTERECTOMY 2010 Last carotid dopplers 01/08/2012 wth patent endarterectomy site  . Status post coronary artery stent placement     Past Surgical History:  Procedure Laterality Date  . APPENDECTOMY    . CARDIAC CATHETERIZATION     FEB 2010, significant branch vessel disease wth diag, marginal, PDA & PLA, nml. LV function  . CARDIAC CATHETERIZATION  N/A 05/07/2016   Procedure: Left Heart Cath and Coronary Angiography;  Surgeon: Peter M Swaziland, MD;  Location: [facility] INVASIVE CV LAB;  Service: Cardiovascular;  Laterality: N/A;  . CARDIAC CATHETERIZATION N/A 05/07/2016   Procedure: Coronary Stent Intervention;  Surgeon: Peter M Swaziland, MD;  Location: [facility] INVASIVE CV LAB;  Service: Cardiovascular;  Laterality: N/A;  . CAROTID ENDARTERECTOMY  09/2008   Rt CEA  . LUNG BIOPSY  2013   Bx's suggest granulomatous dz.  . RT ANKLE   2013     Allergies  No Known Allergies  History of Present Illness    Mr. Mcelhiney is a 51 yo male with PMH of CAD(recently admitted from 1/3-1/59for MI, had PCI of Om2), HFrEF (EF 35%), DMII (on insulin), HLD and carotid disease. He was discharged home from that admission, but returned shortly there after with 8lb weight gain, worsening dyspnea and decreased UOP despite full compliance with his outpatient diuretic. His BNP was 610 and weight was increased from 239-246lbs.    During that admission he was given lasix 40 mg IV bid with good diuresis X2 days and symptoms greatly improved. Echo that admission showed a further reduced EF from 30 to 25%. He was transitioned to torsemide 20mg  BID with instructions to give extra 20mg  PRN weight gain.   He was seenn back in the office on 05/19/16 by Theodore Demark and reported feeling better. No chest pain, and had been increasing his activity level  without any complications. He was continued on hid home medications without any change.   Was in his usual state of health up until this morning. He was found sitting in a chair shaking by his family. Noted pulseless at 1441. CPR was started by family member, placed on AED and given 1 shock. Pulses returned. No medications given. EKG showed ST with LVH and repol abnormality. Code STEMI was activated. He was intubated in the ED.   Inpatient Medications    . amiodarone      . [MAR Hold] aspirin  300 mg Rectal NOW    Family History      Family History  Problem Relation Age of Onset  . Hypertension Mother   . Diabetes Mother   . Hypertension Maternal Grandfather   . Heart attack Paternal Grandfather   . Heart attack Father 69    Social History    Social History   Social History  . Marital status: Married    Spouse name: Erskine Squibb  . Number of children: 3  . Years of education: N/A   Occupational History  . inspector for [employer]    Social History Main Topics  . Smoking status: Former Smoker    Packs/day: 2.00    Years: 22.00    Types: Cigarettes    Quit date: 10/23/2011  . Smokeless tobacco: Never Used  . Alcohol use 0.0 oz/week    2 - 3 Cans of beer per week     Comment: occasional  . Drug use: No  . Sexual activity: Not on file   Other Topics Concern  . Not on file   Social History Narrative   Pt lives with family in [place], [place].     Review of Systems   Obtained from family and ED provider:   General:  No chills, fever, night sweats or weight changes.  Cardiovascular:  No chest pain, dyspnea on exertion, edema, orthopnea, palpitations, paroxysmal nocturnal dyspnea. Dermatological: No rash, lesions/masses Respiratory: No cough, dyspnea Urologic: No hematuria, dysuria Abdominal:   No nausea, vomiting, diarrhea, bright red blood per rectum, melena, or hematemesis Neurologic:  No visual changes, wkns, changes in mental status. All other systems reviewed and are otherwise negative except as noted above.  Physical Exam    Blood pressure (!) 166/106, pulse (!) 104, temperature 98.1 F (36.7 C), temperature source Rectal, resp. rate 18, height 5\' 9"  (1.753 m), weight 234 lb (106.1 kg), SpO2 100 %.  General: Intubated Neuro: Intubated, sedated HEENT: Normal  Neck: Supple without bruits or JVD. Lungs:  Resp regular and unlabored, CTA. Heart: RRR no s3, s4, or murmurs. Abdomen: Soft, non-tender, non-distended, BS + x 4.  Extremities: No clubbing, cyanosis or edema. DP/PT/Radials 2+ and equal  bilaterally.  Labs    Troponin [facility] Test)  Recent Labs  05/24/16 1543  TROPIPOC 0.25*   No results for input(s): CKTOTAL, CKMB, TROPONINI in the last 72 hours. Lab Results  Component Value Date   WBC 10.4 05/24/2016   HGB 12.6 (L) 05/24/2016   HCT 37.0 (L) 05/24/2016   MCV 84.9 05/24/2016   PLT 248 05/24/2016    Recent Labs Lab 05/24/16 1544  NA 140  K 4.9  CL 104  BUN 37*  CREATININE 1.50*  GLUCOSE 274*   Lab Results  Component Value Date   CHOL 109 05/24/2016   HDL 23 (L) 05/24/2016   LDLCALC 58 05/24/2016   TRIG 139 05/24/2016   Lab Results  Component Value  Date   DDIMER  06/16/2008    0.26        AT THE INHOUSE ESTABLISHED CUTOFF VALUE OF 0.48 ug/mL FEU, THIS ASSAY HAS BEEN DOCUMENTED IN THE LITERATURE TO HAVE A SENSITIVITY AND NEGATIVE PREDICTIVE VALUE OF AT LEAST 98 TO 99%.  THE TEST RESULT SHOULD BE CORRELATED WITH AN ASSESSMENT OF THE CLINICAL PROBABILITY OF DVT / VTE.     Radiology Studies    Dg Chest 2 View  Result Date: 05/12/2016 CLINICAL DATA:  Recent myocardial infarction. 3 pound weight gain and worsening shortness of breath. Assess for fluid overload. EXAM: CHEST  2 VIEW COMPARISON:  Chest radiograph May 07, 2016 FINDINGS: Cardiomediastinal silhouette is normal. Diffuse interstitial prominence. Small pleural effusions and bibasilar strandy densities/atelectasis. No pneumothorax. Calcified granulomas projecting RIGHT mid and lower lung zone. Soft tissue planes and included osseous structures are nonsuspicious. Calcifications superior to the bilateral humeral heads associated with calcific tendinopathy. IMPRESSION: Diffuse interstitial prominence which can be secondary to atypical infection or fluid overload with new small pleural effusions. Electronically Signed   By: Awilda Metro M.D.   On: 05/12/2016 20:32   Dg Chest 2 View  Result Date: 05/07/2016 CLINICAL DATA:  Chest pain and dyspnea for 2 days. EXAM: CHEST  2 VIEW  COMPARISON:  01/06/2012, 04/12/2012. FINDINGS: Calcified granulomatous changes are again evident, stable. There is diffuse interstitial thickening which may represent interstitial edema. This is new. No confluent airspace consolidation. No large effusions. Hilar and mediastinal contours are unremarkable and unchanged. IMPRESSION: New interstitial thickening. This may represent interstitial edema although a component of fibrosis is not excluded. Electronically Signed   By: Ellery Plunk M.D.   On: 05/07/2016 00:24    ECG & Cardiac Imaging    EKG: ST with LVH and repol abnormality   Echo: 05/15/16  Study Conclusions  - Left ventricle: The cavity size was moderately dilated. Wall   thickness was normal. Systolic function was severely reduced. The   estimated ejection fraction was in the range of 25% to 30%.   Akinesis and scarring of the anteroseptal and anterior   myocardium; consistent with infarction in the distribution of the   left anterior descending coronary artery. Dyskinesis of the   apicalanterior myocardium. Features are consistent with a   pseudonormal left ventricular filling pattern, with concomitant   abnormal relaxation and increased filling pressure (grade 2   diastolic dysfunction). No evidence of thrombus. - Left atrium: The atrium was mildly dilated. - Atrial septum: No defect or patent foramen ovale was identified.  Cardiac Cath: 05/07/16    LV end diastolic pressure is severely elevated.  Ost LAD to Prox LAD lesion, 30 %stenosed.  Ost 2nd Diag to 2nd Diag lesion, 50 %stenosed.  Ost 1st Diag to 1st Diag lesion, 70 %stenosed.  Ost Cx to Prox Cx lesion, 50 %stenosed.  Ost 1st Mrg to 1st Mrg lesion, 35 %stenosed.  2nd Mrg lesion, 90 %stenosed.  A STENT PROMUS PREM MR 2.5X24 drug eluting stent was successfully placed.  Post intervention, there is a 0% residual stenosis.  Prox RCA to Mid RCA lesion, 50 %stenosed.  RPDA lesion, 100 %stenosed.   1.  Diffuse coronary artery disease with relatively small diabetic vessels.  2. Severe segmental stenosis in the second OM. Lesion is hazy and ulcerative. This is the culprit lesion 3. Severely elevated LVEDP 4. Successful stenting of the second OM with DES.  Plan: DAPT for one year. Aggressive risk factor modification. Will treat with IV lasix  for elevated LVEDP. Add ARB, beta blocker as tolerated. Assess LV function with Echo. Trend serial troponins and ECG.      Assessment & Plan    51 yo male with PMH of chronic combined HF (25-30%), HLD R-CEA 2010, DM2, STEMI w/ DES OM2 05/07/2016 who presented to the ED as a post arrest.  1. Cardiac Arrest with likely VTach (rhythm registered on AED): Found down by family, sitting in a chair "shaking". Given a single shock by AED, no medications given. Pulses returned. Intubated. EKG on arrival showed ST with LVH, Code STEMI called and patient taken to the lab with Dr. Herbie BaltimoreHarding. Further recommendations to follow.   2. CAD s/p DES to 2nd Marg: Noted to have diffuse residual disease. Currently undergoing relook cath with Dr. Herbie BaltimoreHarding.   Janice CoffinSigned, Lindsay Roberts, NP-C Pager [phone] 05/24/2016, 4:29 PM   ATTENDING ATTESTATION:  I have seen, examined and evaluated the patient this PM along with  Ms. Su Hiltoberts, NP in the ER.  After reviewing all the available data and chart, we discussed the patients laboratory, study & physical findings as well as symptoms in detail. I agree with her findings, examination as well as impression recommendations as per our discussion.    51 year old gentleman with recent inferior STEMI status post PCI to circumflex along with known Dilated CM who suddenly had an admission for acute on chronic combined heart failure. He was then seen on the 15th by Theodore Demarkhonda Barrett, PA and was increasing activity, doing relatively well.  He was resting in his chair today and then began having movements that look like seizure activity witnessed by  his wife. His wife and daughter-in-law then laid on the floor when he became unresponsive and started CPR. First responders arrived with an AED then identified and rhythm and shocked him. Really had relatively quick return of spontaneous circulation. (The family estimates maybe 10 minutes with time he started having a "seizure" activity).  Upon evaluation in the ER he was intubated, sedated and paralyzed but was in A. fib RVR rates in the 160s and blood pressures in the 180/60 range. We treated him with IV amiodarone and his rate came down to 110s. A code STEMI was called by the ER physician based on an abnormal EKG. On my review this did not meet criteria for "STEMI ", but postarrest following recent STEMI and known cardiomyopathy I felt it was prudent to perform diagnostic catheterization to evaluate his recently placed stent and PCI if necessary.  Working diagnosis would be acute non-STEMI/cardiac arrest.    He was started on cooling protocol during the ER with ice packs. Arctic Wynelle LinkSun was started [facility].  Depending how well he does miss outcome, I would consider that he may very well be a candidate for AICD either this admission or to be discharged with a LifeVest.  Cardiology will follow along in consultation with PCCM managing hypothermia.   Bryan Lemmaavid Silena Wyss, M.D., M.S. Interventional Cardiologist   Pager # [phone] Phone # [phone] [address] [place], [place] [address]

## 2016-05-24 NOTE — Progress Notes (Signed)
   Met w/ family in consult B.  Will follow, as needed.  - Rev. Boothwyn MDiv ThM

## 2016-05-24 NOTE — Progress Notes (Signed)
eLink Physician-Brief Progress Note Patient Name: Margarita SermonsClifford M Leven DOB: 06-21-1965 MRN: [id number]   Date of Service  05/24/2016  HPI/Events of Note  Seizure activity noted by nurse.  Resolved with versed.  eICU Interventions  Propofol drip ordered. EEG     Intervention Category Major Interventions: Seizures - evaluation and management  Henry RusselSMITH, Leenah Seidner, P 05/24/2016, 7:57 PM

## 2016-05-24 NOTE — ED Notes (Signed)
Preparing for intubation

## 2016-05-24 NOTE — Code Documentation (Signed)
Dr. Jodi MourningZavitz and Dr. Shanon RosserPage intubating at this time.

## 2016-05-24 NOTE — Progress Notes (Signed)
Patient transported from CT and back without complications. RT will continue to monitor.

## 2016-05-24 NOTE — Code Documentation (Signed)
Per [facility], pt was found shaking by family at home, was down at 1441, CPR started by family and pulses, 1 shock given by AED, pulses returned 1447 before EMS arrival. PT HR 120 by ems, BP 105/80. Pt hx of cardiac issues, MI, cold fluids started by ems. No drugs given. TWO IVs in place.

## 2016-05-24 NOTE — Code Documentation (Signed)
ICE BAGS PLACED IN GROINS AND AXILLARY

## 2016-05-24 NOTE — Consult Note (Signed)
PULMONARY / CRITICAL CARE MEDICINE   Name: Douglas Carr MRN: [id number] DOB: [birth year]    ADMISSION DATE:  05/24/2016 CONSULTATION DATE:  05/24/2016  REFERRING MD :  Dr. Herbie Baltimore  CHIEF COMPLAINT:  STEMI/Post arrest   HISTORY OF PRESENT ILLNESS:   51 yo male with PMH of CAD(recently admitted from 1/3-1/76for MI, had PCI of Om2), HFrEF (EF 35%), DMII (on insulin), HLD and carotid disease.  Had intermittent fluid retention at home with one additional admission for this but today was in his usual state of health up until this morning. He was found sitting in a chair shaking by his family. Noted pulseless at 1441. CPR was started by family member, placed on AED and given 1 shock. Pulses returned. No medications given. EKG showed ST with LVH and repol abnormality. Code STEMI was activated. He was intubated in the ED.   He was taken to the cath lab and DES placed in Circumflex.  PAST MEDICAL HISTORY :   has a past medical history of Acute ST elevation myocardial infarction (STEMI) involving left circumflex coronary artery (HCC) (05/07/2016); Anginal pain (HCC); Anxiety; Arthritis; Bone spur of ankle; Chronic combined systolic and diastolic CHF, NYHA class 2 and ACA/AHA stage C (HCC) (05/13/2016); Coronary artery disease involving native coronary artery of native heart with angina pectoris (HCC) (05/24/2016); Diabetes mellitus; Dilated cardiomyopathy (HCC) (05/24/2016); Hyperlipidemia; Hypertension; Myocardial infarction (1996); Peripheral vascular disease (HCC); and Status post coronary artery stent placement.  has a past surgical history that includes Cardiac catheterization; Appendectomy; Carotid endarterectomy (09/2008); Lung biopsy (2013); RT ANKLE  (2013); Cardiac catheterization (N/A, 05/07/2016); and Cardiac catheterization (N/A, 05/07/2016). Prior to Admission medications   Medication Sig Start Date End Date Taking? Authorizing Provider  acetaminophen (TYLENOL) 325 MG tablet Take 2 tablets  (650 mg total) by mouth every 4 (four) hours as needed for headache or mild pain. 05/11/16   Abelino Derrick, PA-C  aspirin 81 MG chewable tablet Chew 1 tablet (81 mg total) by mouth daily. 05/12/16   Abelino Derrick, PA-C  atorvastatin (LIPITOR) 80 MG tablet Take 1 tablet (80 mg total) by mouth daily at 6 PM. 05/11/16   Abelino Derrick, PA-C  carvedilol (COREG) 6.25 MG tablet Take 1 tablet (6.25 mg total) by mouth 2 (two) times daily with a meal. 05/11/16   Abelino Derrick, PA-C  insulin aspart (NOVOLOG FLEXPEN) 100 UNIT/ML FlexPen Inject 6 to 10 units 3 times a day prior to each meal and as needed for blood sugar greater than 200 Patient taking differently: Inject 0-10 Units into the skin 3 (three) times daily as needed for high blood sugar. as needed for blood sugar greater than 200 11/14/15   [name], MD  Insulin Degludec (TRESIBA FLEXTOUCH) 200 UNIT/ML SOPN Inject 160 Units into the skin daily. 11/15/15   [name], MD  isosorbide mononitrate (IMDUR) 30 MG 24 hr tablet Take 1 tablet (30 mg total) by mouth daily. 05/12/16   Abelino Derrick, PA-C  losartan (COZAAR) 100 MG tablet Take 1 tablet (100 mg total) by mouth daily. 05/12/16   Abelino Derrick, PA-C  nitroGLYCERIN (NITROSTAT) 0.4 MG SL tablet Place 1 tablet (0.4 mg total) under the tongue every 5 (five) minutes x 3 doses as needed for chest pain. 05/11/16   Abelino Derrick, PA-C  potassium chloride SA (K-DUR,KLOR-CON) 20 MEQ tablet Take 1 tablet (20 mEq total) by mouth daily. 05/12/16   Abelino Derrick, PA-C  spironolactone (ALDACTONE) 25 MG  tablet Take 0.5 tablets (12.5 mg total) by mouth daily. 05/12/16   Abelino DerrickLuke K Kilroy, PA-C  ticagrelor (BRILINTA) 90 MG TABS tablet Take 1 tablet (90 mg total) by mouth 2 (two) times daily. 05/11/16   Abelino DerrickLuke K Kilroy, PA-C  torsemide (DEMADEX) 20 MG tablet Take 1 tablet (20 mg total) by mouth 2 (two) times daily. 05/15/16   Berton BonJanine Hammond, NP   No Known Allergies  FAMILY HISTORY:  indicated that his mother is alive. He indicated  that his father is deceased. He indicated that his brother is alive. He indicated that his maternal grandmother is deceased. He indicated that his maternal grandfather is deceased. He indicated that his paternal grandmother is deceased. He indicated that his paternal grandfather is deceased.   SOCIAL HISTORY:  reports that he quit smoking about 4 years ago. His smoking use included Cigarettes. He has a 44.00 pack-year smoking history. He has never used smokeless tobacco. He reports that he drinks alcohol. He reports that he does not use drugs.  REVIEW OF SYSTEMS:   Unable to obtain  SUBJECTIVE:   VITAL SIGNS: Temp:  [95.9 F (35.5 C)-98.1 F (36.7 C)] 97.7 F (36.5 C) (01/20 1830) Pulse Rate:  [0-140] 61 (01/20 1900) Resp:  [0-45] 18 (01/20 1830) BP: (94-217)/(59-138) 114/71 (01/20 1830) SpO2:  [0 %-100 %] 100 % (01/20 1900) Arterial Line BP: (112-131)/(67-77) 112/67 (01/20 1900) FiO2 (%):  [70 %-100 %] 100 % (01/20 1806) Weight:  [106.1 kg (234 lb)-106.2 kg (234 lb 2.1 oz)] 106.2 kg (234 lb 2.1 oz) (01/20 1806) HEMODYNAMICS:   VENTILATOR SETTINGS: Vent Mode: PRVC FiO2 (%):  [70 %-100 %] 100 % Set Rate:  [14 bmp-18 bmp] 18 bmp Vt Set:  [570 mL-590 mL] 570 mL PEEP:  [5 cmH20] 5 cmH20 Plateau Pressure:  [22 cmH20-24 cmH20] 24 cmH20 INTAKE / OUTPUT:  Intake/Output Summary (Last 24 hours) at 05/24/16 1913 Last data filed at 05/24/16 1900  Gross per 24 hour  Intake           758.05 ml  Output             1000 ml  Net          -241.95 ml    PHYSICAL EXAMINATION: General:  Sedated Neuro:  Sedtated HEENT:  ETT in place, NCAT Cardiovascular:  Bradycardic, no m/r/g, no JVD Lungs:  CTA b/l no w/r/r Abdomen:  Soft, non distended, no bowel sounds Musculoskeletal:  Normal bulk and tone Skin:  Cool, no c/c/e, Right femoral groin Sheath- no hematoma  LABS:  CBC  Recent Labs Lab 05/24/16 1537 05/24/16 1544  WBC 10.4  --   HGB 12.9* 12.6*  HCT 38.8* 37.0*  PLT 248  --     Coag's  Recent Labs Lab 05/24/16 1537  APTT 26  INR 1.07   BMET  Recent Labs Lab 05/24/16 1537 05/24/16 1544  NA 138 140  K 5.0 4.9  CL 105 104  CO2 22  --   BUN 30* 37*  CREATININE 1.43* 1.50*  GLUCOSE 277* 274*   Electrolytes  Recent Labs Lab 05/24/16 1537  CALCIUM 8.8*   Sepsis Markers No results for input(s): LATICACIDVEN, PROCALCITON, O2SATVEN in the last 168 hours. ABG No results for input(s): PHART, PCO2ART, PO2ART in the last 168 hours. Liver Enzymes  Recent Labs Lab 05/24/16 1537  AST 115*  ALT 166*  ALKPHOS 53  BILITOT 0.6  ALBUMIN 3.5   Cardiac Enzymes  Recent Labs Lab 05/24/16 1537  TROPONINI  0.26*   Glucose No results for input(s): GLUCAP in the last 168 hours.  Imaging Dg Chest Portable 1 View  Result Date: 05/24/2016 CLINICAL DATA:  Status post intubation. EXAM: PORTABLE CHEST 1 VIEW COMPARISON:  05/12/2016 FINDINGS: Endotracheal tube has been placed and terminates approximately 4 cm above the carina. Enteric tube courses into the left upper abdomen with tip not imaged. Cardiac silhouette is accentuated by portable AP technique and low lung volumes. Diffuse interstitial densities bilaterally have mildly increased. No large pleural effusion or pneumothorax is identified. Calcified granulomas are again seen in the right lung. IMPRESSION: 1. Endotracheal tube in satisfactory position. 2. Worsening bilateral interstitial opacities which may reflect edema or infection. Electronically Signed   By: Sebastian Ache M.D.   On: 05/24/2016 16:44     ASSESSMENT / PLAN:  PULMONARY  A: Intubated for airway protection Pulmonary edema Pulmonary nodule x3 right sided  P:   - Ventilator management bundle - Holding diuresis in the setting of shock - PRVC 600/20/0.5/5 - ETT in appropriate location - OK to use CVC - Current vent settings appropriate  CARDIOVASCULAR A:  Cardiogenic shock NSVT ACS  HFrEF  P:  - on levophed now - titrate  for MAP >65 - CVC inserted - checking electrolytes including mag and phose - Trending lactates - will check CVP - s/p DES - ticagralor load and dosing per cardiology - Right sheath removal per protocol - Atorvastatin - maintain carvedilol at low dose now given shock, if lactate trends up, will d/c  RENAL A:   AKI P:   - improving - checking electrolytes for repletion - maintain foley cath   GASTROINTESTINAL A:   Transamonitis - likely 2/2 ACS P:   - Trend LFTS - PPI ppx  HEMATOLOGIC A:   Not active P:    INFECTIOUS A:   Not active P:    ENDOCRINE A:   Hyperglycemia   DM-2 P:   - basal bolus insulin  NEUROLOGIC A:   Sedated P:   RASS goal: 0  -Propofol gtt   Total critical care time: 30 min  Critical care time was exclusive of separately billable procedures and treating other patients.  Critical care was necessary to treat or prevent imminent or life-threatening deterioration.  Critical care was time spent personally by me on the following activities: development of treatment plan with patient and/or surrogate as well as nursing, discussions with consultants, evaluation of patient's response to treatment, examination of patient, obtaining history from patient or surrogate, ordering and performing treatments and interventions, ordering and review of laboratory studies, ordering and review of radiographic studies, pulse oximetry and re-evaluation of patient's condition.   Galvin Proffer, DO., MS [facility]     Pulmonary and Critical Care Medicine [facility] Pager: [phone]  05/24/2016, 7:13 PM

## 2016-05-24 NOTE — Procedures (Signed)
ABG results called to Dr. Katrinka BlazingSmith. Vent settings changed per MD order.  Will get ABG X1hr.

## 2016-05-24 NOTE — Progress Notes (Signed)
eLink Physician-Brief Progress Note Patient Name: Douglas SermonsClifford M Bigos DOB: 11/02/1965 MRN: [id number]   Date of Service  05/24/2016  HPI/Events of Note  Spoke to neurology about seizures  eICU Interventions  EEG being considered     Intervention Category Major Interventions: Seizures - evaluation and management  Henry RusselSMITH, Nieshia Larmon, P 05/24/2016, 8:06 PM

## 2016-05-24 NOTE — ED Notes (Signed)
[facility] contacted to activate Code Stemi 

## 2016-05-24 NOTE — Consult Note (Signed)
Cardiology Consult    Patient ID: Douglas Carr MRN: [id number], DOB/AGE: [birth year]   Admit date: 05/24/2016 Date of Consult: 05/24/2016  Primary Physician: [name], MD Primary Cardiologist: Dr. Allyson Sabal Requesting Provider: Dr. Jodi Mourning Reason for Consultation: Cardiac Arrest  Patient Profile    51 yo male with PMH of chronic combined HF (25-30%), HLD R-CEA 2010, DM2, STEMI w/ DES OM2 05/07/2016 who presented to the ED as a post arrest.   Past Medical History   Past Medical History:  Diagnosis Date  . Acute ST elevation myocardial infarction (STEMI) involving left circumflex coronary artery (HCC) 05/07/2016   PCI to Cx-OM  . Anginal pain (HCC)    secondary to sm. vessel disease  . Anxiety   . Arthritis    BIL KNEE PAIN AND BIL ANKLE PAIN  . Bone spur of ankle   . 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 Coronary artery disease involving native coronary artery of native heart with angina pectoris (HCC) 05/24/2016   Remote MI at 51 years of age, Last cath 2010-diffuse non-obstructive disease; last echo 06/16/08 -normal LV function, moderate concentric hypertrophy; nuc 08/2008 no ischemia;  medical therapy; STEMI May 07 2016 - PCI to Cx-OM  . Diabetes mellitus    ON ORAL MEDICATION AND INSULIN  . Dilated cardiomyopathy (HCC) 05/24/2016   EF 325-30% by Echo post STEMI (previously 30-35%)   . Hyperlipidemia   . Hypertension   . Myocardial infarction 1996   Post MI  . Peripheral vascular disease (HCC)    HAS LEFT CAROTID ARTERY STENOSIS   AND IS S/P RIGHT CAROTID ENDARTERECTOMY 2010 Last carotid dopplers 01/08/2012 wth patent endarterectomy site  . Status post coronary artery stent placement     Past Surgical History:  Procedure Laterality Date  . APPENDECTOMY    . CARDIAC CATHETERIZATION     FEB 2010, significant branch vessel disease wth diag, marginal, PDA & PLA, nml. LV function  . CARDIAC CATHETERIZATION  N/A 05/07/2016   Procedure: Left Heart Cath and Coronary Angiography;  Surgeon: Peter M Swaziland, MD;  Location: [facility] INVASIVE CV LAB;  Service: Cardiovascular;  Laterality: N/A;  . CARDIAC CATHETERIZATION N/A 05/07/2016   Procedure: Coronary Stent Intervention;  Surgeon: Peter M Swaziland, MD;  Location: [facility] INVASIVE CV LAB;  Service: Cardiovascular;  Laterality: N/A;  . CAROTID ENDARTERECTOMY  09/2008   Rt CEA  . LUNG BIOPSY  2013   Bx's suggest granulomatous dz.  . RT ANKLE   2013     Allergies  No Known Allergies  History of Present Illness    Douglas Carr is a 51 yo male with PMH of CAD(recently admitted from 1/3-1/[birth year]for MI, had PCI of Om2), HFrEF (EF 35%), DMII (on insulin), HLD and carotid disease. He was discharged home from that admission, but returned shortly there after with 8lb weight gain, worsening dyspnea and decreased UOP despite full compliance with his outpatient diuretic. His BNP was 610 and weight was increased from 239-246lbs.    During that admission he was given lasix 40 mg IV bid with good diuresis X2 days and symptoms greatly improved. Echo that admission showed a further reduced EF from 30 to 25%. He was transitioned to torsemide 20mg  BID with instructions to give extra 20mg  PRN weight gain.   He was seenn back in the office on 05/19/16 by Theodore Demark and reported feeling better. No chest pain, and had been increasing his activity level  without any complications. He was continued on hid home medications without any change.   Was in his usual state of health up until this morning. He was found sitting in a chair shaking by his family. Noted pulseless at 1441. CPR was started by family member, placed on AED and given 1 shock. Pulses returned. No medications given. EKG showed ST with LVH and repol abnormality. Code STEMI was activated. He was intubated in the ED.   Inpatient Medications    . amiodarone      . [MAR Hold] aspirin  300 mg Rectal NOW    Family History      Family History  Problem Relation Age of Onset  . Hypertension Mother   . Diabetes Mother   . Hypertension Maternal Grandfather   . Heart attack Paternal Grandfather   . Heart attack Father 69    Social History    Social History   Social History  . Marital status: Married    Spouse name: Erskine Squibb  . Number of children: 3  . Years of education: N/A   Occupational History  . inspector for [employer]    Social History Main Topics  . Smoking status: Former Smoker    Packs/day: 2.00    Years: 22.00    Types: Cigarettes    Quit date: 10/23/2011  . Smokeless tobacco: Never Used  . Alcohol use 0.0 oz/week    2 - 3 Cans of beer per week     Comment: occasional  . Drug use: No  . Sexual activity: Not on file   Other Topics Concern  . Not on file   Social History Narrative   Pt lives with family in [place], [place].     Review of Systems   Obtained from family and ED provider:   General:  No chills, fever, night sweats or weight changes.  Cardiovascular:  No chest pain, dyspnea on exertion, edema, orthopnea, palpitations, paroxysmal nocturnal dyspnea. Dermatological: No rash, lesions/masses Respiratory: No cough, dyspnea Urologic: No hematuria, dysuria Abdominal:   No nausea, vomiting, diarrhea, bright red blood per rectum, melena, or hematemesis Neurologic:  No visual changes, wkns, changes in mental status. All other systems reviewed and are otherwise negative except as noted above.  Physical Exam    Blood pressure (!) 166/106, pulse (!) 104, temperature 98.1 F (36.7 C), temperature source Rectal, resp. rate 18, height 5\' 9"  (1.753 m), weight 234 lb (106.1 kg), SpO2 100 %.  General: Intubated Neuro: Intubated, sedated HEENT: Normal  Neck: Supple without bruits or JVD. Lungs:  Resp regular and unlabored, CTA. Heart: RRR no s3, s4, or murmurs. Abdomen: Soft, non-tender, non-distended, BS + x 4.  Extremities: No clubbing, cyanosis or edema. DP/PT/Radials 2+ and equal  bilaterally.  Labs    Troponin [facility] Test)  Recent Labs  05/24/16 1543  TROPIPOC 0.25*   No results for input(s): CKTOTAL, CKMB, TROPONINI in the last 72 hours. Lab Results  Component Value Date   WBC 10.4 05/24/2016   HGB 12.6 (L) 05/24/2016   HCT 37.0 (L) 05/24/2016   MCV 84.9 05/24/2016   PLT 248 05/24/2016    Recent Labs Lab 05/24/16 1544  NA 140  K 4.9  CL 104  BUN 37*  CREATININE 1.50*  GLUCOSE 274*   Lab Results  Component Value Date   CHOL 109 05/24/2016   HDL 23 (L) 05/24/2016   LDLCALC 58 05/24/2016   TRIG 139 05/24/2016   Lab Results  Component Value  Date   DDIMER  06/16/2008    0.26        AT THE INHOUSE ESTABLISHED CUTOFF VALUE OF 0.48 ug/mL FEU, THIS ASSAY HAS BEEN DOCUMENTED IN THE LITERATURE TO HAVE A SENSITIVITY AND NEGATIVE PREDICTIVE VALUE OF AT LEAST 98 TO 99%.  THE TEST RESULT SHOULD BE CORRELATED WITH AN ASSESSMENT OF THE CLINICAL PROBABILITY OF DVT / VTE.     Radiology Studies    Dg Chest 2 View  Result Date: 05/12/2016 CLINICAL DATA:  Recent myocardial infarction. 3 pound weight gain and worsening shortness of breath. Assess for fluid overload. EXAM: CHEST  2 VIEW COMPARISON:  Chest radiograph May 07, 2016 FINDINGS: Cardiomediastinal silhouette is normal. Diffuse interstitial prominence. Small pleural effusions and bibasilar strandy densities/atelectasis. No pneumothorax. Calcified granulomas projecting RIGHT mid and lower lung zone. Soft tissue planes and included osseous structures are nonsuspicious. Calcifications superior to the bilateral humeral heads associated with calcific tendinopathy. IMPRESSION: Diffuse interstitial prominence which can be secondary to atypical infection or fluid overload with new small pleural effusions. Electronically Signed   By: Awilda Metro M.D.   On: 05/12/2016 20:32   Dg Chest 2 View  Result Date: 05/07/2016 CLINICAL DATA:  Chest pain and dyspnea for 2 days. EXAM: CHEST  2 VIEW  COMPARISON:  01/06/2012, 04/12/2012. FINDINGS: Calcified granulomatous changes are again evident, stable. There is diffuse interstitial thickening which may represent interstitial edema. This is new. No confluent airspace consolidation. No large effusions. Hilar and mediastinal contours are unremarkable and unchanged. IMPRESSION: New interstitial thickening. This may represent interstitial edema although a component of fibrosis is not excluded. Electronically Signed   By: Ellery Plunk M.D.   On: 05/07/2016 00:24    ECG & Cardiac Imaging    EKG: ST with LVH and repol abnormality   Echo: 05/15/16  Study Conclusions  - Left ventricle: The cavity size was moderately dilated. Wall   thickness was normal. Systolic function was severely reduced. The   estimated ejection fraction was in the range of 25% to 30%.   Akinesis and scarring of the anteroseptal and anterior   myocardium; consistent with infarction in the distribution of the   left anterior descending coronary artery. Dyskinesis of the   apicalanterior myocardium. Features are consistent with a   pseudonormal left ventricular filling pattern, with concomitant   abnormal relaxation and increased filling pressure (grade 2   diastolic dysfunction). No evidence of thrombus. - Left atrium: The atrium was mildly dilated. - Atrial septum: No defect or patent foramen ovale was identified.  Cardiac Cath: 05/07/16    LV end diastolic pressure is severely elevated.  Ost LAD to Prox LAD lesion, 30 %stenosed.  Ost 2nd Diag to 2nd Diag lesion, 50 %stenosed.  Ost 1st Diag to 1st Diag lesion, 70 %stenosed.  Ost Cx to Prox Cx lesion, 50 %stenosed.  Ost 1st Mrg to 1st Mrg lesion, 35 %stenosed.  2nd Mrg lesion, 90 %stenosed.  A STENT PROMUS PREM MR 2.5X24 drug eluting stent was successfully placed.  Post intervention, there is a 0% residual stenosis.  Prox RCA to Mid RCA lesion, 50 %stenosed.  RPDA lesion, 100 %stenosed.   1.  Diffuse coronary artery disease with relatively small diabetic vessels.  2. Severe segmental stenosis in the second OM. Lesion is hazy and ulcerative. This is the culprit lesion 3. Severely elevated LVEDP 4. Successful stenting of the second OM with DES.  Plan: DAPT for one year. Aggressive risk factor modification. Will treat with IV lasix  for elevated LVEDP. Add ARB, beta blocker as tolerated. Assess LV function with Echo. Trend serial troponins and ECG.      Assessment & Plan    51 yo male with PMH of chronic combined HF (25-30%), HLD R-CEA 2010, DM2, STEMI w/ DES OM2 05/07/2016 who presented to the ED as a post arrest.  1. Cardiac Arrest with likely VTach (rhythm registered on AED): Found down by family, sitting in a chair "shaking". Given a single shock by AED, no medications given. Pulses returned. Intubated. EKG on arrival showed ST with LVH, Code STEMI called and patient taken to the lab with Dr. Herbie BaltimoreHarding. Further recommendations to follow.   2. CAD s/p DES to 2nd Marg: Noted to have diffuse residual disease. Currently undergoing relook cath with Dr. Herbie BaltimoreHarding.   Janice CoffinSigned, Lindsay Roberts, NP-C Pager [phone] 05/24/2016, 4:29 PM   ATTENDING ATTESTATION:  I have seen, examined and evaluated the patient this PM along with  Ms. Su Hiltoberts, NP in the ER.  After reviewing all the available data and chart, we discussed the patients laboratory, study & physical findings as well as symptoms in detail. I agree with her findings, examination as well as impression recommendations as per our discussion.    51 year old gentleman with recent inferior STEMI status post PCI to circumflex along with known Dilated CM who suddenly had an admission for acute on chronic combined heart failure. He was then seen on the 15th by Theodore Demarkhonda Barrett, PA and was increasing activity, doing relatively well.  He was resting in his chair today and then began having movements that look like seizure activity witnessed by  his wife. His wife and daughter-in-law then laid on the floor when he became unresponsive and started CPR. First responders arrived with an AED then identified and rhythm and shocked him. Really had relatively quick return of spontaneous circulation. (The family estimates maybe 10 minutes with time he started having a "seizure" activity).  Upon evaluation in the ER he was intubated, sedated and paralyzed but was in A. fib RVR rates in the 160s and blood pressures in the 180/60 range. We treated him with IV amiodarone and his rate came down to 110s. A code STEMI was called by the ER physician based on an abnormal EKG. On my review this did not meet criteria for "STEMI ", but postarrest following recent STEMI and known cardiomyopathy I felt it was prudent to perform diagnostic catheterization to evaluate his recently placed stent and PCI if necessary.  Working diagnosis would be acute non-STEMI/cardiac arrest.    He was started on cooling protocol during the ER with ice packs. Arctic Wynelle LinkSun was [facility].  Depending how well he does miss outcome, I would consider that he may very well be a candidate for AICD either this admission or to be discharged with a LifeVest.  Cardiology will follow along in consultation with PCCM managing hypothermia.   [name], M.D., M.S. Interventional Cardiologist   Pager # [phone] Phone # [phone] [address] [place], [place] [address]

## 2016-05-24 NOTE — Progress Notes (Signed)
eLink Physician-Brief Progress Note Patient Name: Douglas SermonsClifford M Weaber DOB: Jan 21, 1966 MRN: [id number]   Date of Service  05/24/2016  HPI/Events of Note  Hypotension.   eICU Interventions   Fluid bolus of 500 cc NS and if no response levophed drip. Will need cvc     Intervention Category Intermediate Interventions: Hypotension - evaluation and management  Henry RusselSMITH, Prima Rayner, P 05/24/2016, 8:26 PM

## 2016-05-25 DIAGNOSIS — G9340 Encephalopathy, unspecified: Secondary | ICD-10-CM

## 2016-05-25 DIAGNOSIS — I4901 Ventricular fibrillation: Principal | ICD-10-CM

## 2016-05-25 DIAGNOSIS — I469 Cardiac arrest, cause unspecified: Secondary | ICD-10-CM

## 2016-05-25 LAB — CBC
HCT: 36.2 % — ABNORMAL LOW (ref 39.0–52.0)
Hemoglobin: 12.2 g/dL — ABNORMAL LOW (ref 13.0–17.0)
MCH: 28.6 pg (ref 26.0–34.0)
MCHC: 33.7 g/dL (ref 30.0–36.0)
MCV: 85 fL (ref 78.0–100.0)
Platelets: 287 K/uL (ref 150–400)
RBC: 4.26 MIL/uL (ref 4.22–5.81)
RDW: 12.9 % (ref 11.5–15.5)
WBC: 19.7 K/uL — ABNORMAL HIGH (ref 4.0–10.5)

## 2016-05-25 LAB — CMP14+EGFR
ALBUMIN: 4.1 g/dL (ref 3.5–5.5)
ALK PHOS: 65 IU/L (ref 39–117)
ALT: 28 IU/L (ref 0–44)
AST: 18 IU/L (ref 0–40)
Albumin/Globulin Ratio: 1.5 (ref 1.2–2.2)
BILIRUBIN TOTAL: 0.2 mg/dL (ref 0.0–1.2)
BUN / CREAT RATIO: 24 — AB (ref 9–20)
BUN: 29 mg/dL — AB (ref 6–24)
CHLORIDE: 100 mmol/L (ref 96–106)
CO2: 27 mmol/L (ref 18–29)
Calcium: 9.4 mg/dL (ref 8.7–10.2)
Creatinine, Ser: 1.19 mg/dL (ref 0.76–1.27)
GFR calc Af Amer: 82 mL/min/{1.73_m2} (ref >59)
GFR calc non Af Amer: 71 mL/min/{1.73_m2} (ref >59)
GLUCOSE: 120 mg/dL — AB (ref 65–99)
Globulin, Total: 2.8 g/dL (ref 1.5–4.5)
Potassium: 4.6 mmol/L (ref 3.5–5.2)
Sodium: 145 mmol/L — ABNORMAL HIGH (ref 134–144)
Total Protein: 6.9 g/dL (ref 6.0–8.5)

## 2016-05-25 LAB — GLUCOSE, CAPILLARY
Glucose-Capillary: 197 mg/dL — ABNORMAL HIGH (ref 65–99)
Glucose-Capillary: 242 mg/dL — ABNORMAL HIGH (ref 65–99)
Glucose-Capillary: 220 mg/dL — ABNORMAL HIGH (ref 65–99)
Glucose-Capillary: 183 mg/dL — ABNORMAL HIGH (ref 65–99)
Glucose-Capillary: 83 mg/dL (ref 65–99)

## 2016-05-25 LAB — BASIC METABOLIC PANEL
ANION GAP: 8 (ref 5–15)
BUN: 30 mg/dL — ABNORMAL HIGH (ref 6–20)
CALCIUM: 7.8 mg/dL — AB (ref 8.9–10.3)
CHLORIDE: 107 mmol/L (ref 101–111)
CO2: 24 mmol/L (ref 22–32)
Creatinine, Ser: 1.26 mg/dL — ABNORMAL HIGH (ref 0.61–1.24)
GFR calc non Af Amer: >60 mL/min (ref >60)
Glucose, Bld: 281 mg/dL — ABNORMAL HIGH (ref 65–99)
Potassium: 4.8 mmol/L (ref 3.5–5.1)
Sodium: 139 mmol/L (ref 135–145)

## 2016-05-25 LAB — PROTIME-INR
INR: 1.13
PROTHROMBIN TIME: 14.6 s (ref 11.4–15.2)

## 2016-05-25 LAB — LIPID PANEL
CHOLESTEROL TOTAL: 117 mg/dL (ref 100–199)
Chol/HDL Ratio: 4.3 ratio units (ref 0.0–5.0)
HDL: 27 mg/dL — ABNORMAL LOW (ref >39)
LDL Calculated: 65 mg/dL (ref 0–99)
Triglycerides: 127 mg/dL (ref 0–149)
VLDL CHOLESTEROL CAL: 25 mg/dL (ref 5–40)

## 2016-05-25 LAB — PHOSPHORUS: Phosphorus: 3.7 mg/dL (ref 2.5–4.6)

## 2016-05-25 LAB — MAGNESIUM: Magnesium: 1.9 mg/dL (ref 1.7–2.4)

## 2016-05-25 LAB — TROPONIN I
TROPONIN I: 0.19 ng/mL — AB (ref <0.03)
Troponin I: 0.48 ng/mL

## 2016-05-25 LAB — APTT: APTT: 31 s (ref 24–36)

## 2016-05-25 LAB — MRSA PCR SCREENING: MRSA BY PCR: NEGATIVE

## 2016-05-25 MED ORDER — INSULIN ASPART 100 UNIT/ML ~~LOC~~ SOLN
0.0000 [IU] | SUBCUTANEOUS | Status: DC
  Administered 2016-05-25 (×2): 5 [IU] via SUBCUTANEOUS
  Administered 2016-05-25: 3 [IU] via SUBCUTANEOUS
  Administered 2016-05-25 – 2016-05-26 (×2): 5 [IU] via SUBCUTANEOUS
  Administered 2016-05-26 (×3): 2 [IU] via SUBCUTANEOUS
  Administered 2016-05-26: 5 [IU] via SUBCUTANEOUS
  Administered 2016-05-26 – 2016-05-27 (×2): 3 [IU] via SUBCUTANEOUS
  Administered 2016-05-27: 2 [IU] via SUBCUTANEOUS
  Administered 2016-05-27: 3 [IU] via SUBCUTANEOUS
  Administered 2016-05-27: 5 [IU] via SUBCUTANEOUS
  Administered 2016-05-27 (×2): 3 [IU] via SUBCUTANEOUS
  Administered 2016-05-28 – 2016-05-29 (×3): 2 [IU] via SUBCUTANEOUS
  Administered 2016-05-29: 3 [IU] via SUBCUTANEOUS
  Administered 2016-05-29: 2 [IU] via SUBCUTANEOUS
  Administered 2016-05-29 (×2): 3 [IU] via SUBCUTANEOUS
  Administered 2016-05-30: 2 [IU] via SUBCUTANEOUS
  Administered 2016-05-30: 5 [IU] via SUBCUTANEOUS
  Administered 2016-05-30: 2 [IU] via SUBCUTANEOUS
  Administered 2016-05-30: 3 [IU] via SUBCUTANEOUS
  Administered 2016-05-30: 2 [IU] via SUBCUTANEOUS
  Administered 2016-05-31: 5 [IU] via SUBCUTANEOUS
  Administered 2016-05-31 (×2): 3 [IU] via SUBCUTANEOUS
  Administered 2016-05-31: 2 [IU] via SUBCUTANEOUS
  Administered 2016-05-31: 5 [IU] via SUBCUTANEOUS
  Administered 2016-05-31: 3 [IU] via SUBCUTANEOUS
  Administered 2016-06-01: 8 [IU] via SUBCUTANEOUS
  Administered 2016-06-01 (×2): 3 [IU] via SUBCUTANEOUS
  Administered 2016-06-01 – 2016-06-02 (×3): 2 [IU] via SUBCUTANEOUS
  Administered 2016-06-02: 3 [IU] via SUBCUTANEOUS
  Administered 2016-06-02: 2 [IU] via SUBCUTANEOUS
  Administered 2016-06-02: 3 [IU] via SUBCUTANEOUS
  Administered 2016-06-02 (×3): 2 [IU] via SUBCUTANEOUS
  Administered 2016-06-03 (×2): 3 [IU] via SUBCUTANEOUS
  Administered 2016-06-03: 2 [IU] via SUBCUTANEOUS
  Administered 2016-06-03 (×2): 3 [IU] via SUBCUTANEOUS
  Administered 2016-06-04 – 2016-06-05 (×8): 2 [IU] via SUBCUTANEOUS

## 2016-05-25 MED ORDER — AMIODARONE HCL IN DEXTROSE 360-4.14 MG/200ML-% IV SOLN
INTRAVENOUS | Status: AC
  Administered 2016-05-25: 30 mg/h
  Filled 2016-05-25: qty 200

## 2016-05-25 MED ORDER — AMIODARONE HCL IN DEXTROSE 360-4.14 MG/200ML-% IV SOLN
30.0000 mg/h | INTRAVENOUS | Status: DC
  Administered 2016-05-25 – 2016-05-31 (×15): 30 mg/h via INTRAVENOUS
  Filled 2016-05-25 (×12): qty 200

## 2016-05-25 MED ORDER — SODIUM CHLORIDE 0.9% FLUSH
10.0000 mL | Freq: Two times a day (BID) | INTRAVENOUS | Status: DC
  Administered 2016-05-25: 30 mL
  Administered 2016-05-27 – 2016-05-28 (×3): 10 mL
  Administered 2016-05-29: 30 mL
  Administered 2016-05-29 – 2016-05-30 (×2): 10 mL
  Administered 2016-05-30: 20 mL
  Administered 2016-05-31 – 2016-06-01 (×3): 10 mL

## 2016-05-25 MED ORDER — POTASSIUM CHLORIDE 20 MEQ/15ML (10%) PO SOLN
20.0000 meq | Freq: Every day | ORAL | Status: DC
  Administered 2016-05-25: 20 meq via ORAL
  Filled 2016-05-25: qty 15

## 2016-05-25 MED ORDER — MAGNESIUM SULFATE 2 GM/50ML IV SOLN
2.0000 g | Freq: Once | INTRAVENOUS | Status: AC
  Administered 2016-05-25: 2 g via INTRAVENOUS
  Filled 2016-05-25: qty 50

## 2016-05-25 MED ORDER — SODIUM CHLORIDE 0.9 % IV SOLN
3.0000 g | Freq: Four times a day (QID) | INTRAVENOUS | Status: DC
  Administered 2016-05-25 – 2016-05-27 (×8): 3 g via INTRAVENOUS
  Filled 2016-05-25 (×9): qty 3

## 2016-05-25 MED ORDER — SODIUM CHLORIDE 0.9% FLUSH
10.0000 mL | INTRAVENOUS | Status: DC | PRN
Start: 2016-05-25 — End: 2016-05-30
  Administered 2016-05-28: 10 mL
  Filled 2016-05-25: qty 40

## 2016-05-25 MED ORDER — SODIUM CHLORIDE 0.9 % IV SOLN: INTRAVENOUS | Status: DC | PRN

## 2016-05-25 MED ORDER — FAMOTIDINE 40 MG/5ML PO SUSR
20.0000 mg | Freq: Every day | ORAL | Status: DC
  Administered 2016-05-25 – 2016-06-17 (×19): 20 mg via ORAL
  Filled 2016-05-25 (×25): qty 2.5

## 2016-05-25 NOTE — Progress Notes (Signed)
Subjective: Interval History: No clinical seizures noted since yesterday.    Objective: Vital signs in last 24 hours: Temp:  [95.7 F (35.4 C)-98.1 F (36.7 C)] 95.7 F (35.4 C) (01/21 0346) Pulse Rate:  [0-140] 73 (01/21 0815) Resp:  [0-45] 18 (01/21 0815) BP: (92-217)/(43-138) 124/47 (01/21 0815) SpO2:  [0 %-100 %] 99 % (01/21 0815) Arterial Line BP: (85-175)/(37-101) 159/61 (01/21 0235) FiO2 (%):  [50 %-100 %] 50 % (01/21 0815) Weight:  [106.1 kg (234 lb)-111.2 kg (245 lb 2.4 oz)] 111.2 kg (245 lb 2.4 oz) (01/21 0346)  Intake/Output from previous day: 01/20 0701 - 01/21 0700 In: 2483 [I.V.:2343; NG/GT:30; IV Piggyback:110] Out: 1950 [Urine:1950] Intake/Output this shift: No intake/output data recorded. Nutritional status:    Neurologic Exam:   Intubated on Propofol and Fentanyl.  I stopped both gtt to examine him.  No eye opening to verbal or tactile stimuli. Pupils small and unreactive. No corneal reflexes bilaterally. No oculocephalic reflex bilaterally. No gag reflex. No withdrawal or grimace to pain in any extremity.    Bedside EEG- moderate to severe generalized slowing.  No epileptiform discharges or seizures.  Improved some relative to burst suppression pattern seen yesterday.    Lab Results:  Recent Labs  05/24/16 1537 05/24/16 1544 05/24/16 1900 05/25/16 0444  WBC 10.4  --   --  19.7*  HGB 12.9* 12.6*  --  12.2*  HCT 38.8* 37.0*  --  36.2*  PLT 248  --   --  287  NA 138 140  --  139  K 5.0 4.9  --  4.8  CL 105 104  --  107  CO2 22  --   --  24  GLUCOSE 277* 274*  --  281*  BUN 30* 37*  --  30*  CREATININE 1.43* 1.50* 1.10 1.26*  CALCIUM 8.8*  --   --  7.8*   Lipid Panel  Recent Labs  05/24/16 1537  CHOL 109  TRIG 139  HDL 23*  CHOLHDL 4.7  VLDL 28  LDLCALC 58    Studies/Results: Ct Head Wo Contrast  Result Date: 05/24/2016 CLINICAL DATA:  51 y/o  M; seizures. EXAM: CT HEAD WITHOUT CONTRAST TECHNIQUE: Contiguous axial images were  obtained from the base of the skull through the vertex without intravenous contrast. COMPARISON:  11/11/2003 CT head. FINDINGS: Brain: No evidence of acute infarction, hemorrhage, hydrocephalus, extra-axial collection or mass lesion/mass effect. Vascular: Minimal calcific atherosclerosis of cavernous internal carotid arteries. Choose Skull: Normal. Negative for fracture or focal lesion. Sinuses/Orbits: No acute finding. Other: Debris within the nasopharynx likely due to intubation. IMPRESSION: No acute intracranial abnormality identified. No significant interval change. Electronically Signed   By: Kristine Garbe M.D.   On: 05/24/2016 22:15   Dg Chest Port 1 View  Result Date: 05/25/2016 CLINICAL DATA:  Central line placement. EXAM: PORTABLE CHEST 1 VIEW COMPARISON:  Earlier this day at 73 hour FINDINGS: Tip of the left internal jugular central venous catheter projects over the proximal SVC. No pneumothorax. Endotracheal tube 5 cm from the carina. Enteric tube in place. Stable cardiomegaly allowing for differences in technique. Developing hazy opacity at the right lung base suspicious for worsening pleural effusion. Worsening diffuse interstitial opacities throughout both lungs. IMPRESSION: 1. Tip of the left central line in the proximal SVC. No pneumothorax. 2. Developing hazy opacity at the right lung base, likely pleural fluid. Worsening interstitial opacities may be increasing pulmonary edema, pneumonia, or ARDS. Electronically Signed   By: Jeb Levering  M.D.   On: 05/25/2016 00:53   Dg Chest Portable 1 View  Result Date: 05/24/2016 CLINICAL DATA:  Status post intubation. EXAM: PORTABLE CHEST 1 VIEW COMPARISON:  05/12/2016 FINDINGS: Endotracheal tube has been placed and terminates approximately 4 cm above the carina. Enteric tube courses into the left upper abdomen with tip not imaged. Cardiac silhouette is accentuated by portable AP technique and low lung volumes. Diffuse interstitial  densities bilaterally have mildly increased. No large pleural effusion or pneumothorax is identified. Calcified granulomas are again seen in the right lung. IMPRESSION: 1. Endotracheal tube in satisfactory position. 2. Worsening bilateral interstitial opacities which may reflect edema or infection. Electronically Signed   By: Logan Bores M.D.   On: 05/24/2016 16:44    Medications:  Scheduled: . aspirin  81 mg Per Tube Daily  . aspirin  300 mg Rectal NOW  . atorvastatin  80 mg Per Tube q1800  . carvedilol  6.25 mg Per Tube BID WC  . chlorhexidine gluconate (MEDLINE KIT)  15 mL Mouth Rinse BID  . famotidine (PEPCID) IV  20 mg Intravenous Q24H  . furosemide  40 mg Intravenous BID  . heparin  5,000 Units Subcutaneous Q8H  . insulin aspart  0-15 Units Subcutaneous Q4H  . levETIRAcetam  500 mg Intravenous Q12H  . mouth rinse  15 mL Mouth Rinse 10 times per day  . potassium chloride SA  20 mEq Oral Daily  . sodium chloride flush  3 mL Intravenous Q12H  . ticagrelor  90 mg Oral BID    Assessment/Plan:  Cardiac arrest with anoxic brain injury.  His neurological exam shows no evidence of brainstem reflexes.  Prognosis is very poor for meaningful recovery.  We will continue to follow for any possible changes.  Cont AED, but no active seizures on EEG.     LOS: 1 day   Rogue Jury, MD 05/25/2016  9:11 AM

## 2016-05-25 NOTE — Procedures (Signed)
CENTRAL VENOUS CATHETER INSERTION   Indication: Shock Consent: yes Time out: yes Appropriate position: yes Hand washing: yes Patient Sterilized and Draped: yes Location: Left IJ # of Attempts: 1 Ultrasound Guidance: yes Wire Confirmed with US: yes Insertion depth: 20 cm All Ports Draw and flush: yes CXR:   Pneumothorax: no  Line position appropriate: yes Line sutured in place: yes EBL: 5 cc Complications: no Patient Tolerated Procedure Well: yes     Douglas Patras, DO., MS  Pulmonary and Critical Care Medicine  

## 2016-05-25 NOTE — Progress Notes (Signed)
Progress Note  Patient Name: Douglas Carr Date of Encounter: 05/25/2016  Primary Cardiologist: Dr. Gwenlyn Found  Subjective   Sedate on vent  Inpatient Medications    Scheduled Meds: . ampicillin-sulbactam (UNASYN) IV  3 g Intravenous Q6H  . aspirin  81 mg Per Tube Daily  . aspirin  300 mg Rectal NOW  . atorvastatin  80 mg Per Tube q1800  . carvedilol  6.25 mg Per Tube BID WC  . chlorhexidine gluconate (MEDLINE KIT)  15 mL Mouth Rinse BID  . famotidine  20 mg Oral Daily  . furosemide  40 mg Intravenous BID  . heparin  5,000 Units Subcutaneous Q8H  . insulin aspart  0-15 Units Subcutaneous Q4H  . levETIRAcetam  500 mg Intravenous Q12H  . magnesium sulfate 1 - 4 g bolus IVPB  2 g Intravenous Once  . mouth rinse  15 mL Mouth Rinse 10 times per day  . potassium chloride  20 mEq Oral Daily  . sodium chloride flush  3 mL Intravenous Q12H  . ticagrelor  90 mg Oral BID   Continuous Infusions: . amiodarone 30 mg/hr (05/25/16 0800)  . fentaNYL infusion INTRAVENOUS 150 mcg/hr (05/24/16 2000)  . norepinephrine (LEVOPHED) Adult infusion 5 mcg/min (05/25/16 0959)  . propofol (DIPRIVAN) infusion 40 mcg/kg/min (05/25/16 1309)   PRN Meds: sodium chloride, Place/Maintain arterial line **AND** sodium chloride, acetaminophen, fentaNYL, fentaNYL (SUBLIMAZE) injection, midazolam, ondansetron (ZOFRAN) IV, sodium chloride flush   Vital Signs    Vitals:   05/25/16 1130 05/25/16 1200 05/25/16 1205 05/25/16 1230  BP: (!) 111/22 (!) 105/56 (!) 105/56 (!) 87/73  Pulse: (!) 57 (!) 58 69 (!) 59  Resp: 18 18 18 18   Temp: 97.6 F (36.4 C)     TempSrc: Oral     SpO2: 99% 99% 99% 100%  Weight:      Height:        Intake/Output Summary (Last 24 hours) at 05/25/16 1334 Last data filed at 05/25/16 1100  Gross per 24 hour  Intake          3017.03 ml  Output             2620 ml  Net           397.03 ml   Filed Weights   05/24/16 1535 05/24/16 1806 05/25/16 0346  Weight: 234 lb (106.1 kg)  234 lb 2.1 oz (106.2 kg) 245 lb 2.4 oz (111.2 kg)    Telemetry    Multiple short bursts of NSVT (3beats) - Personally Reviewed  ECG    NSR, ant ST elevations, TWI - Personally Reviewed  Physical Exam   GEN: Ill appearing on vent.  Neck: No JVD Cardiac: RRR, no murmurs, rubs, or gallops.  Respiratory: Clear to auscultation bilaterally. Vent noise GI: Soft, nontender, non-distended  MS: No edema; No deformity. Neuro:  sedate Psych:sedate  Labs    Chemistry Recent Labs Lab 05/24/16 8185 05/24/16 1537 05/24/16 1544 05/24/16 1900 05/25/16 0444  NA 145* 138 140  --  139  K 4.6 5.0 4.9  --  4.8  CL 100 105 104  --  107  CO2 27 22  --   --  24  GLUCOSE 120* 277* 274*  --  281*  BUN 29* 30* 37*  --  30*  CREATININE 1.19 1.43* 1.50* 1.10 1.26*  CALCIUM 9.4 8.8*  --   --  7.8*  PROT 6.9 6.3*  --   --   --   ALBUMIN  4.1 3.5  --   --   --   AST 18 115*  --   --   --   ALT 28 166*  --   --   --   ALKPHOS 65 53  --   --   --   BILITOT 0.2 0.6  --   --   --   GFRNONAA 71 56*  --  >60 >60  GFRAA 82 >60  --  >60 >60  ANIONGAP  --  11  --   --  8     Hematology Recent Labs Lab 05/24/16 1537 05/24/16 1544 05/25/16 0444  WBC 10.4  --  19.7*  RBC 4.57  --  4.26  HGB 12.9* 12.6* 12.2*  HCT 38.8* 37.0* 36.2*  MCV 84.9  --  85.0  MCH 28.2  --  28.6  MCHC 33.2  --  33.7  RDW 12.9  --  12.9  PLT 248  --  287    Cardiac Enzymes Recent Labs Lab 05/24/16 1537 05/24/16 1900 05/25/16 0444  TROPONINI 0.26* 0.44* 0.48*    Recent Labs Lab 05/24/16 1543  TROPIPOC 0.25*     BNPNo results for input(s): BNP, PROBNP in the last 168 hours.   DDimer No results for input(s): DDIMER in the last 168 hours.   Radiology    Ct Head Wo Contrast  Result Date: 05/24/2016 CLINICAL DATA:  51 y/o  M; seizures. EXAM: CT HEAD WITHOUT CONTRAST TECHNIQUE: Contiguous axial images were obtained from the base of the skull through the vertex without intravenous contrast. COMPARISON:   11/11/2003 CT head. FINDINGS: Brain: No evidence of acute infarction, hemorrhage, hydrocephalus, extra-axial collection or mass lesion/mass effect. Vascular: Minimal calcific atherosclerosis of cavernous internal carotid arteries. Choose Skull: Normal. Negative for fracture or focal lesion. Sinuses/Orbits: No acute finding. Other: Debris within the nasopharynx likely due to intubation. IMPRESSION: No acute intracranial abnormality identified. No significant interval change. Electronically Signed   By: Kristine Garbe M.D.   On: 05/24/2016 22:15   Dg Chest Port 1 View  Result Date: 05/25/2016 CLINICAL DATA:  Central line placement. EXAM: PORTABLE CHEST 1 VIEW COMPARISON:  Earlier this day at 60 hour FINDINGS: Tip of the left internal jugular central venous catheter projects over the proximal SVC. No pneumothorax. Endotracheal tube 5 cm from the carina. Enteric tube in place. Stable cardiomegaly allowing for differences in technique. Developing hazy opacity at the right lung base suspicious for worsening pleural effusion. Worsening diffuse interstitial opacities throughout both lungs. IMPRESSION: 1. Tip of the left central line in the proximal SVC. No pneumothorax. 2. Developing hazy opacity at the right lung base, likely pleural fluid. Worsening interstitial opacities may be increasing pulmonary edema, pneumonia, or ARDS. Electronically Signed   By: Jeb Levering M.D.   On: 05/25/2016 00:53   Dg Chest Portable 1 View  Result Date: 05/24/2016 CLINICAL DATA:  Status post intubation. EXAM: PORTABLE CHEST 1 VIEW COMPARISON:  05/12/2016 FINDINGS: Endotracheal tube has been placed and terminates approximately 4 cm above the carina. Enteric tube courses into the left upper abdomen with tip not imaged. Cardiac silhouette is accentuated by portable AP technique and low lung volumes. Diffuse interstitial densities bilaterally have mildly increased. No large pleural effusion or pneumothorax is identified.  Calcified granulomas are again seen in the right lung. IMPRESSION: 1. Endotracheal tube in satisfactory position. 2. Worsening bilateral interstitial opacities which may reflect edema or infection. Electronically Signed   By: Logan Bores M.D.   On:  05/24/2016 16:44    Cardiac Studies   Cath 05/24/16  Ost Cx to Mid Cx lesion, 70 %stenosed leading into OM2 as the main trunk of the Circumflex.  Ost 2nd Mrg to 2nd Mrg recent Promus DES 2.5 x 24 stent, - focal 90 %stenosed with what appears to be proximal edge dissection with thrombus  A STENT PROMUS PREM MR 2.5X20 drug eluting stent was successfully placed from Ostium of Circumflex into OM2, and overlaps previously placed stent.  Post intervention, there is a 0% residual stenosis.  ____________________________________________________  Colon Flattery LAD to Prox LAD lesion, 30 %stenosed.  Ost 1st Diag to 1st Diag lesion, 70 %stenosed.  Ost 1st Mrg to 1st Mrg lesion, 35 %stenosed. Ost 2nd Diag to 2nd Diag lesion, 50 %stenosed.  Prox RCA to Mid RCA lesion, 65 %stenosed - lesion appears similar to prior cath, with mild progression.  Very distal RPDA lesion, 100 %stenosed.  LV end diastolic pressure is severely elevated.  There is no aortic valve stenosis.    Status post what is most likely VF or VT arrest 2 weeks post STEMI with PCI to the circumflex.  His EKG itself did not show signs of ST elevation, however given his recent STEMI, known cardiac myopathy and presentation with cardiac arrest, I felt it prudent taken to the Cath Lab.  Angiographically there appeared to be a possible proximal edge dissection with thrombus in this lesion, however there is TIMI 2 flow. This lesion was treated with an overlapping proximal stent up to the ostium of the circumflex and OM 2. Angiographically there was good result. It is quite possible at least 1 and strut is into the left main, but non flow-limiting.  Plan:  He'll be admitted to the CCU on hypothermia  protocol per PCCM  Continue amiodarone overnight as he appears to have converted into sinus rhythm  Hold ARB and Imdur for now. We'll try to start beta blocker.  Cardiology will follow along while on cooling protocol.   Glenetta Hew, M.D., M.S. Interventional Cardiologist   ECHO 05/15/16 - Left ventricle: The cavity size was moderately dilated. Wall   thickness was normal. Systolic function was severely reduced. The   estimated ejection fraction was in the range of 25% to 30%.   Akinesis and scarring of the anteroseptal and anterior   myocardium; consistent with infarction in the distribution of the   left anterior descending coronary artery. Dyskinesis of the   apicalanterior myocardium. Features are consistent with a   pseudonormal left ventricular filling pattern, with concomitant   abnormal relaxation and increased filling pressure (grade 2   diastolic dysfunction). No evidence of thrombus. - Left atrium: The atrium was mildly dilated. - Atrial septum: No defect or patent foramen ovale was identified.  Patient Profile     51 y.o. male post cardiac arrest with anoxic brain injury  Assessment & Plan    Cardiac arrest  - found by family pulseless, shaking. CPR. AED, shock.   - DES placed in circ at site of possible edge dissection  Cardiogenic shock  - Levophed  - CCM  - Lasix 40 BID  Chronic systolic HF/ischemic cardiomyopathy  - shock.  - No Bb, or ACE currently  - mild lasix.   NSVT  - amio gtt IV  - short bursts of NSVT (3 beats)  Anoxic brain injury  - per neuro "His neurological exam shows no evidence of brainstem reflexes.  Prognosis is very poor for meaningful recovery. "  I have consulted  palliative care team. Wife would like to discuss if her 45 year old daughter should visit and see him in this condition. She did witness him at home agonal breathing. Appreciate expert advice on this situation.    Signed, Candee Furbish, MD  05/25/2016, 1:34 PM

## 2016-05-25 NOTE — Progress Notes (Signed)
Started rewarming process at 1700. Propofol and Fentanyl turned off as instructed previously by Dr. Lucy Chrisios. At 1735 patient had a seizure. Restarted Fentanyl and Propofol. Versed 2 mg IV given. Sinus rhythm with bigeminy of PVCs during seizure. BP 144/102.

## 2016-05-25 NOTE — Progress Notes (Signed)
Right femoral arterial sheath pulled. Manual pressure held for 20 minutes until hemostasis occurred, patient tolerated well, vital signs stable. Site is a level 0: site is absent of hematoma, bleeding, and bruising. Will continue to monitor per protocol.

## 2016-05-25 NOTE — Procedures (Signed)
  Electroencephalogram report- LTM    Data acquisition: 10-20 electrode placement.  Additional T1, T2, and EKG electrodes; 26 channel digital referential acquisition reformatted to 18 channel/7 channel coronal bipolar      Beginning time: 05/24/16 at 19 23 12   Ending time: 05/25/16 at 09 23 12 am   Day of study: day 1    This 13 hours of intensive EEG monitoring with simultaneous video monitoring was performed for this patient with unresponsiveness after cardiac arrest.  This EEG was obtained to rule out nonconvulsive status epilepticus.  Patient is sedated.  There was no pushbutton activations events during this recording.   During first half of the recording background activities marked by bursts and suppression pattern were burst of mixed frequencies lasting between 2-3 seconds alternating with 3 seconds of attenuated background activities.  As recording progresses during second half of the recording background activities become more continuous with faster frequencies emerging.  At that time background activities marked by 0.5-2 cps attenuated broadly distributed delta slowing with superimposed faster frequencies in the theta range at times reaching 6 cps.  Appear to be slightly more prominent slowing across left hemisphere however findings are very mild.  There was no epileptiform discharges clinical subclinical seizures present.  Clinical interpretation: This 13  hours of intensive EEG monitoring with simultaneous monitoring did not record any clinical or subclinical seizures.  Background activities were marked by initially burst suppression pattern followed by more continuous background activities as discussed above.  There is no evidence for nonconvulsive status epilepticus.  However these  findings are suggestive of severe encephalopathy of nonspecific etiologies including toxic metabolic pharmacological multifocal degenerative etiologies.  Sedation status and or cooling protocol may  contribute to above findings.  Clinical correlation is advised

## 2016-05-25 NOTE — Progress Notes (Signed)
PULMONARY / CRITICAL CARE MEDICINE   ADMISSION DATE:  05/24/2016 CONSULTATION DATE:  05/24/2016  REFERRING MD :  Dr. Herbie Baltimore  CHIEF COMPLAINT:  STEMI/Post arrest  HISTORY OF PRESENT ILLNESS:   51 yo male with PMH of CAD(recently admitted from 1/3-1/57for MI, had PCI of Om2), HFrEF (EF 35%), DMII (on insulin), HLD and carotid disease.  Had intermittent fluid retention at home with one additional admission for this but today was in his usual state of health up until this morning. He was found sitting in a chair shaking by his family. Noted pulseless at 1441. CPR was started by family member, placed on AED and given 1 shock. Pulses returned. No medications given. EKG showed ST with LVH and repol abnormality. Code STEMI was activated. He was intubated in the ED.   He was taken to the cath lab and DES placed in Circumflex.  SUBJECTIVE: No acute events overnight. No recurrent clinical seizure activity.   VITAL SIGNS: Temp:  [95.7 F (35.4 C)-98.1 F (36.7 C)] 95.7 F (35.4 C) (01/21 0346) Pulse Rate:  [0-140] 56 (01/21 0900) Resp:  [0-45] 18 (01/21 0900) BP: (92-217)/(43-138) 129/47 (01/21 0900) SpO2:  [0 %-100 %] 100 % (01/21 0900) Arterial Line BP: (85-175)/(37-101) 159/61 (01/21 0235) FiO2 (%):  [50 %-100 %] 50 % (01/21 0815) Weight:  [234 lb (106.1 kg)-245 lb 2.4 oz (111.2 kg)] 245 lb 2.4 oz (111.2 kg) (01/21 0346) HEMODYNAMICS:   VENTILATOR SETTINGS: Vent Mode: PRVC FiO2 (%):  [50 %-100 %] 50 % Set Rate:  [14 bmp-18 bmp] 18 bmp Vt Set:  [570 mL-590 mL] 570 mL PEEP:  [5 cmH20] 5 cmH20 Plateau Pressure:  [19 cmH20-24 cmH20] 20 cmH20 INTAKE / OUTPUT:  Intake/Output Summary (Last 24 hours) at 05/25/16 0950 Last data filed at 05/25/16 0700  Gross per 24 hour  Intake          2483.03 ml  Output             1950 ml  Net           533.03 ml    PHYSICAL EXAMINATION: General:  Sedated, ill appearing HEENT:  ETT in place, pupils weakly reactive bilaterally Cardiovascular:  RRR, no  m/r/g, no JVD Lungs:  Mild inspiratory crackles in lower lung fields Abdomen:  Soft, non distended, hypoactive bowel sounds Musculoskeletal:  Normal bulk and tone Skin:  Cool, no rashes Neuro: Mental Status: Patient does not respond to verbal stimuli.  Does not respond to deep sternal rub.  Does not follow commands.  No verbalizations noted.  Cranial Nerves: II: patient does not respond confrontation bilaterally, pupils right 3 mm, left 3 mm,and weakly reactive bilaterally III,IV,VI: doll's response absent bilaterally.  V,VII: corneal reflex absent bilaterally  VIII: patient does not respond to verbal stimuli IX,X: gag reflex absent, XI: trapezius strength unable to test bilaterally XII: tongue strength unable to test Motor: Extremities flaccid throughout.  No spontaneous movement noted.  No purposeful movements noted. Sensory: Does not respond to noxious stimuli in any extremity. Deep Tendon Reflexes:  Positive DTRs Plantars: absent bilaterally Cerebellar: Unable to perform  LABS:  CBC  Recent Labs Lab 05/24/16 1537 05/24/16 1544 05/25/16 0444  WBC 10.4  --  19.7*  HGB 12.9* 12.6* 12.2*  HCT 38.8* 37.0* 36.2*  PLT 248  --  287   Coag's  Recent Labs Lab 05/24/16 1537 05/25/16 0444  APTT 26 31  INR 1.07 1.13   BMET  Recent Labs Lab 05/24/16 0822 05/24/16 1537  05/24/16 1544 05/24/16 1900 05/25/16 0444  NA 145* 138 140  --  139  K 4.6 5.0 4.9  --  4.8  CL 100 105 104  --  107  CO2 27 22  --   --  24  BUN 29* 30* 37*  --  30*  CREATININE 1.19 1.43* 1.50* 1.10 1.26*  GLUCOSE 120* 277* 274*  --  281*   Electrolytes  Recent Labs Lab 05/24/16 0822 05/24/16 1537 05/25/16 0444 05/25/16 0703  CALCIUM 9.4 8.8* 7.8*  --   MG  --   --   --  1.9  PHOS  --   --   --  3.7   Sepsis Markers No results for input(s): LATICACIDVEN, PROCALCITON, O2SATVEN in the last 168 hours. ABG  Recent Labs Lab 05/24/16 1950  PHART 7.317*  PCO2ART 49.9*  PO2ART 481*    Liver Enzymes  Recent Labs Lab 05/24/16 0822 05/24/16 1537  AST 18 115*  ALT 28 166*  ALKPHOS 65 53  BILITOT 0.2 0.6  ALBUMIN 4.1 3.5   Cardiac Enzymes  Recent Labs Lab 05/24/16 1537 05/24/16 1900 05/25/16 0444  TROPONINI 0.26* 0.44* 0.48*   Glucose  Recent Labs Lab 05/24/16 1917 05/24/16 2017 05/25/16 0002 05/25/16 0811  GLUCAP 197* 207* 204* 242*    Imaging Ct Head Wo Contrast  Result Date: 05/24/2016 CLINICAL DATA:  51 y/o  M; seizures. EXAM: CT HEAD WITHOUT CONTRAST TECHNIQUE: Contiguous axial images were obtained from the base of the skull through the vertex without intravenous contrast. COMPARISON:  11/11/2003 CT head. FINDINGS: Brain: No evidence of acute infarction, hemorrhage, hydrocephalus, extra-axial collection or mass lesion/mass effect. Vascular: Minimal calcific atherosclerosis of cavernous internal carotid arteries. Choose Skull: Normal. Negative for fracture or focal lesion. Sinuses/Orbits: No acute finding. Other: Debris within the nasopharynx likely due to intubation. IMPRESSION: No acute intracranial abnormality identified. No significant interval change. Electronically Signed   By: Mitzi Hansen M.D.   On: 05/24/2016 22:15   Dg Chest Port 1 View  Result Date: 05/25/2016 CLINICAL DATA:  Central line placement. EXAM: PORTABLE CHEST 1 VIEW COMPARISON:  Earlier this day at 1548 hour FINDINGS: Tip of the left internal jugular central venous catheter projects over the proximal SVC. No pneumothorax. Endotracheal tube 5 cm from the carina. Enteric tube in place. Stable cardiomegaly allowing for differences in technique. Developing hazy opacity at the right lung base suspicious for worsening pleural effusion. Worsening diffuse interstitial opacities throughout both lungs. IMPRESSION: 1. Tip of the left central line in the proximal SVC. No pneumothorax. 2. Developing hazy opacity at the right lung base, likely pleural fluid. Worsening interstitial  opacities may be increasing pulmonary edema, pneumonia, or ARDS. Electronically Signed   By: Rubye Oaks M.D.   On: 05/25/2016 00:53   Dg Chest Portable 1 View  Result Date: 05/24/2016 CLINICAL DATA:  Status post intubation. EXAM: PORTABLE CHEST 1 VIEW COMPARISON:  05/12/2016 FINDINGS: Endotracheal tube has been placed and terminates approximately 4 cm above the carina. Enteric tube courses into the left upper abdomen with tip not imaged. Cardiac silhouette is accentuated by portable AP technique and low lung volumes. Diffuse interstitial densities bilaterally have mildly increased. No large pleural effusion or pneumothorax is identified. Calcified granulomas are again seen in the right lung. IMPRESSION: 1. Endotracheal tube in satisfactory position. 2. Worsening bilateral interstitial opacities which may reflect edema or infection. Electronically Signed   By: Sebastian Ache M.D.   On: 05/24/2016 16:44   Lines/Tubes: ETT 1/20 >>  L IJ CVL 1/20 >>  Cultures: None  Antibiotics: None  Studies: CT Head 1/20 >> negative CXR 1/20 >> Endotracheal tube in satisfactory position. Worsening bilateral interstitial opacities which may reflect edema or infection. CXR 1/21 >> Tip of the left central line in the proximal SVC. No Pneumothorax. Developing hazy opacity at the right lung base, likely pleural fluid. Worsening interstitial opacities may be increasing pulmonary edema, pneumonia, or ARDS. EEG 1/20 >> moderate to severe generalized slowing.  No epileptiform discharges or seizures.  Improved some relative to burst suppression pattern seen yesterday.   ASSESSMENT / PLAN:  PULMONARY A: Intubated for airway protection Pulmonary edema Pulmonary nodule x3 right sided P:   - Ventilator management bundle - PRVC 570/18/0.5/5 - Lasix 40 mg BID  - On Fentanyl and propofol  CARDIOVASCULAR A:  Cardiogenic shock on Levophed NSVT STEMI s/p DES to Cx HFrEF P:  - on levophed now - titrate for MAP  >65 - amiodarone gtt - ASA 81 mg QD, atorvastatin 80 mg QD, Coreg 6.25 mg BID - ticagralor 90 mg BID  RENAL A:   AKI Pulmonary edema P:   - improving, Cr 1.2 this am - checking electrolytes for repletion - maintain foley cath - monitor UOP - lasix 40 mg BID - KCl 20 mEq QD  GASTROINTESTINAL A:   Transaminitis - likely 2/2 ACS P:   - Trend LFTS - Famotidine for ppx - NPO  HEMATOLOGIC A:   Not active P:  Heparin TID SQ   INFECTIOUS A:   Leukocytosis: trended up from 10.4 to 19.7, possibly reactionary versus 2/2 PNA P:   Monitor WBC and Fever Curve Consider starting Unasyn to cover aspiration pneumonia  ENDOCRINE A:   Hyperglycemia   DM-2 P:   - basal bolus insulin  NEUROLOGIC A:   Sedated Anoxic Brain Injury New Onset Seizures: EEG- moderate to severe generalized slowing.  No epileptiform discharges or seizures.  Improved some relative to burst suppression pattern seen yesterday.  P:   RASS goal: 0 -Propofol gtt and fentanyl gtt -Versed prn  -Keppra 500 mg BID  DVT ppx: Heparin TID FEN: NPO Peptic Ulcer ppx: Famotidine  Total critical care time: 30 min  Karlene Lineman, DO PGY-3 Internal Medicine Resident Pager # [phone] 05/25/2016 9:50 AM   ATTENDING NOTE / ATTESTATION NOTE :   I have discussed the case with the resident/APP  Dr. Karlene Lineman.   I agree with the resident/APP's  history, physical examination, assessment, and plans.    I have edited the above note and modified it according to our agreed history, physical examination, assessment and plan.   Briefly, patient admitted after a witnessed arrest. He was a STEMI. He had stent. Intubated for airway protection, possible aspiration pneumonia. Currently on hypothermia protocol. He is a diabetic.  Pt examined. Hypothermic. Blood pressure 110 systolic, on Levophed drip. Heart rate 110. Respiratory rate 20. Sats more than 90% on 40% FiO2. Comfortable. Sedated. ET tube in place. Obese. Good air  entry, crackles at bases. Good S1 and S2. Abdomen was soft, diminished bowel sounds. Trace edema. Cool extremities.  Assessment/Plan : STEMI. Cardiac arrest. S/P DES in circumflex. Cardiology involved. Continue hypothermia protocol, plan to be rewarmed this pm. Cont cardiac meds.   Acute hypoxemic respiratory failure secondary to above, concern for aspiration pneumonia. Mental status and hypothermia are barriers to weaning. Continue Unasyn for now.  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 Assess neurologic status once rewarmed. Neurology following. On continuous  EEG.  DM. Cont sliding scale.    I spent  31  minutes of Critical Care time with this patient today. This is my time spent independent of the APP or resident.   Family :Family updated at length today.  I discussed the case with patient's wife, son, mother. Told them about overall prognosis. See his neurologic status once he is rewarmed.  Pollie MeyerJ. Angelo A de Dios, MD 05/25/2016, 1:19 PM [facility] Pager [phone] After 3 pm or if no answer, call [phone]

## 2016-05-25 NOTE — Progress Notes (Signed)
Unable to dopple a pulse in the right radial wrist, and no collateral circulation in the left radial, contacted Dr. Vassie LollAlva and received orders to hold off on arterial line if cuff correlates with femoral sheath pressure. RT will continue to monitor.

## 2016-05-26 ENCOUNTER — Encounter (HOSPITAL_COMMUNITY): Payer: Self-pay | Admitting: Cardiology

## 2016-05-26 DIAGNOSIS — Z515 Encounter for palliative care: Secondary | ICD-10-CM

## 2016-05-26 DIAGNOSIS — Z95828 Presence of other vascular implants and grafts: Secondary | ICD-10-CM

## 2016-05-26 DIAGNOSIS — G931 Anoxic brain damage, not elsewhere classified: Secondary | ICD-10-CM

## 2016-05-26 DIAGNOSIS — Z789 Other specified health status: Secondary | ICD-10-CM

## 2016-05-26 DIAGNOSIS — I213 ST elevation (STEMI) myocardial infarction of unspecified site: Secondary | ICD-10-CM

## 2016-05-26 DIAGNOSIS — Z7189 Other specified counseling: Secondary | ICD-10-CM

## 2016-05-26 DIAGNOSIS — R569 Unspecified convulsions: Secondary | ICD-10-CM

## 2016-05-26 LAB — POCT I-STAT 3, ART BLOOD GAS (G3+)
Acid-base deficit: 7 mmol/L — ABNORMAL HIGH (ref 0.0–2.0)
BICARBONATE: 21.1 mmol/L (ref 20.0–28.0)
O2 Saturation: 97 %
PH ART: 7.215 — AB (ref 7.350–7.450)
TCO2: 23 mmol/L (ref 0–100)
pCO2 arterial: 52.1 mmHg — ABNORMAL HIGH (ref 32.0–48.0)
pO2, Arterial: 109 mmHg — ABNORMAL HIGH (ref 83.0–108.0)

## 2016-05-26 LAB — PHOSPHORUS
Phosphorus: 2.8 mg/dL (ref 2.5–4.6)
Phosphorus: 2.9 mg/dL (ref 2.5–4.6)

## 2016-05-26 LAB — BASIC METABOLIC PANEL
Anion gap: 8 (ref 5–15)
BUN: 16 mg/dL (ref 6–20)
CO2: 27 mmol/L (ref 22–32)
Calcium: 8.2 mg/dL — ABNORMAL LOW (ref 8.9–10.3)
Chloride: 108 mmol/L (ref 101–111)
Creatinine, Ser: 0.94 mg/dL (ref 0.61–1.24)
GFR calc Af Amer: >60 mL/min (ref >60)
GFR calc non Af Amer: >60 mL/min (ref >60)
Glucose, Bld: 152 mg/dL — ABNORMAL HIGH (ref 65–99)
POTASSIUM: 3.7 mmol/L (ref 3.5–5.1)
Sodium: 143 mmol/L (ref 135–145)

## 2016-05-26 LAB — MAGNESIUM
Magnesium: 2.2 mg/dL (ref 1.7–2.4)
Magnesium: 2.1 mg/dL (ref 1.7–2.4)

## 2016-05-26 LAB — GLUCOSE, CAPILLARY
GLUCOSE-CAPILLARY: 138 mg/dL — AB (ref 65–99)
GLUCOSE-CAPILLARY: 158 mg/dL — AB (ref 65–99)
Glucose-Capillary: 141 mg/dL — ABNORMAL HIGH (ref 65–99)
Glucose-Capillary: 144 mg/dL — ABNORMAL HIGH (ref 65–99)
Glucose-Capillary: 204 mg/dL — ABNORMAL HIGH (ref 65–99)

## 2016-05-26 LAB — CBC
HCT: 37.4 % — ABNORMAL LOW (ref 39.0–52.0)
HEMOGLOBIN: 12.1 g/dL — AB (ref 13.0–17.0)
MCH: 27.8 pg (ref 26.0–34.0)
MCHC: 32.4 g/dL (ref 30.0–36.0)
MCV: 86 fL (ref 78.0–100.0)
Platelets: 227 10*3/uL (ref 150–400)
RBC: 4.35 MIL/uL (ref 4.22–5.81)
RDW: 12.9 % (ref 11.5–15.5)
WBC: 15.9 10*3/uL — AB (ref 4.0–10.5)

## 2016-05-26 LAB — PROCALCITONIN: Procalcitonin: <0.1 ng/mL

## 2016-05-26 LAB — POCT ACTIVATED CLOTTING TIME
Activated Clotting Time: 175 seconds
Activated Clotting Time: 411 seconds

## 2016-05-26 MED ORDER — PRO-STAT SUGAR FREE PO LIQD
30.0000 mL | Freq: Two times a day (BID) | ORAL | Status: DC
  Administered 2016-05-26: 30 mL
  Filled 2016-05-26: qty 30

## 2016-05-26 MED ORDER — FUROSEMIDE 10 MG/ML IJ SOLN
20.0000 mg | Freq: Once | INTRAMUSCULAR | Status: AC
  Administered 2016-05-26: 20 mg via INTRAVENOUS
  Filled 2016-05-26: qty 2

## 2016-05-26 MED ORDER — ADULT MULTIVITAMIN LIQUID CH
15.0000 mL | Freq: Every day | ORAL | Status: DC
  Administered 2016-05-26 – 2016-05-29 (×4): 15 mL
  Filled 2016-05-26 (×5): qty 15

## 2016-05-26 MED ORDER — POTASSIUM CHLORIDE 20 MEQ/15ML (10%) PO SOLN
40.0000 meq | Freq: Once | ORAL | Status: AC
  Administered 2016-05-26: 40 meq via ORAL
  Filled 2016-05-26: qty 30

## 2016-05-26 MED ORDER — FUROSEMIDE 10 MG/ML IJ SOLN
60.0000 mg | Freq: Two times a day (BID) | INTRAMUSCULAR | Status: DC
Start: 2016-05-26 — End: 2016-05-28
  Administered 2016-05-26 – 2016-05-28 (×4): 60 mg via INTRAVENOUS
  Filled 2016-05-26 (×4): qty 6

## 2016-05-26 MED ORDER — VITAL HIGH PROTEIN PO LIQD
1000.0000 mL | ORAL | Status: DC
  Administered 2016-05-26 – 2016-05-29 (×2): 1000 mL
  Filled 2016-05-26 (×5): qty 1000

## 2016-05-26 MED ORDER — SODIUM CHLORIDE 0.9 % IV SOLN
500.0000 mg | Freq: Once | INTRAVENOUS | Status: AC
  Administered 2016-05-26: 500 mg via INTRAVENOUS
  Filled 2016-05-26: qty 5

## 2016-05-26 MED ORDER — PRO-STAT SUGAR FREE PO LIQD
60.0000 mL | Freq: Two times a day (BID) | ORAL | Status: DC
  Administered 2016-05-26 – 2016-05-29 (×6): 60 mL
  Filled 2016-05-26 (×6): qty 60

## 2016-05-26 MED ORDER — VITAL HIGH PROTEIN PO LIQD
1000.0000 mL | ORAL | Status: DC
  Administered 2016-05-26: 1000 mL
  Filled 2016-05-26 (×2): qty 1000

## 2016-05-26 MED ORDER — LEVETIRACETAM 500 MG/5ML IV SOLN
1000.0000 mg | Freq: Two times a day (BID) | INTRAVENOUS | Status: DC
  Administered 2016-05-26 – 2016-05-29 (×7): 1000 mg via INTRAVENOUS
  Filled 2016-05-26 (×8): qty 10

## 2016-05-26 NOTE — Progress Notes (Signed)
Neurology Progress Note  I have reviewed the patient's chart at length. In brief, this is a 23-yo man who was admitted on 05/24/16 following an OOH cardiac arrest. He was found by family sitting in a chair and shaking. They noted that he was pulseless and initiated CPR. EMS arrived and he was shocked once with return of pulses reported after six minutes. EMS transported that patient to the ED where CODE STEMI was activated after EKG showed ST elevations. Cooling protocol was initiated and he was taken emergently for cardiac cath where DES was placed in the circumflex artery. He was unresponsive on arrival to the ED with pupils noted to be reactive to light. He was admitted to the ICU where he was noted to have seizure activity described by RN as stiffening of both hands with shaking movements. This activity was continuous for five minutes and resolved after he was given Versed. Neurology consultation was obtained and he was started on Keppra 500 mg BID. Continuous EEG was initiated on 1/20 with initial burst suppression pattern that has since improved to a pattern of diffuse generalized slowing. He had another seizure witnessed by his RN at 1735 on 1/21 while off propofol and fentanyl--there were restarted after this episode.   Current Meds:   Current Facility-Administered Medications:  .  0.9 %  sodium chloride infusion, 250 mL, Intravenous, PRN, [name], MD .  Place/Maintain arterial line, , , Until Discontinued **AND** 0.9 %  sodium chloride infusion, , Intra-arterial, PRN, [name], MD .  acetaminophen (TYLENOL) tablet 650 mg, 650 mg, Oral, Q4H PRN, [name], MD .  amiodarone (NEXTERONE PREMIX) 360-4.14 MG/200ML-% (1.8 mg/mL) IV infusion, 30 mg/hr, Intravenous, Continuous, [name], MD, Last Rate: 16.7 mL/hr at 05/25/16 2230, 30 mg/hr at 05/25/16 2230 .  Ampicillin-Sulbactam (UNASYN) 3 g in sodium chloride 0.9 % 100 mL IVPB, 3 g, Intravenous, Q6H, [name], RPH, 3 g at  05/26/16 9201 .  aspirin chewable tablet 81 mg, 81 mg, Per Tube, Daily, [name], MD, 81 mg at 05/26/16 1000 .  atorvastatin (LIPITOR) tablet 80 mg, 80 mg, Per Tube, q1800, [name], MD, 80 mg at 05/25/16 1804 .  carvedilol (COREG) tablet 6.25 mg, 6.25 mg, Per Tube, BID WC, [name], MD, 6.25 mg at 05/26/16 0745 .  chlorhexidine gluconate (MEDLINE KIT) (PERIDEX) 0.12 % solution 15 mL, 15 mL, Mouth Rinse, BID, [name], MD, 15 mL at 05/26/16 0746 .  famotidine (PEPCID) 40 MG/5ML suspension 20 mg, 20 mg, Oral, Daily, [name], MD, 20 mg at 05/26/16 0908 .  feeding supplement (PRO-STAT SUGAR FREE 64) liquid 30 mL, 30 mL, Per Tube, BID, [name], MD, 30 mL at 05/26/16 0908 .  feeding supplement (VITAL HIGH PROTEIN) liquid 1,000 mL, 1,000 mL, Per Tube, Q24H, [name], MD, 1,000 mL at 05/26/16 1101 .  fentaNYL (SUBLIMAZE) 2,500 mcg in sodium chloride 0.9 % 250 mL (10 mcg/mL) infusion, 25-400 mcg/hr, Intravenous, Continuous, [name], MD, Stopped at 05/26/16 1000 .  fentaNYL (SUBLIMAZE) bolus via infusion 50 mcg, 50 mcg, Intravenous, Q1H PRN, [name], MD .  fentaNYL (SUBLIMAZE) injection 100 mcg, 100 mcg, Intravenous, Q15 min PRN, [name], MD .  furosemide (LASIX) injection 60 mg, 60 mg, Intravenous, BID, [name], MD .  heparin injection 5,000 Units, 5,000 Units, Subcutaneous, Q8H, [name], MD, 5,000 Units at 05/26/16 [phone] .  insulin aspart (novoLOG) injection 0-15 Units,  0-15 Units, Subcutaneous, Q4H, [name], MD, 2 Units at 05/26/16 [phone] .  levETIRAcetam (KEPPRA) 500 mg in sodium chloride 0.9 % 100 mL IVPB, 500 mg, Intravenous, Q12H, [name], MD, 500 mg at 05/26/16 0908 .  MEDLINE mouth rinse, 15 mL, Mouth Rinse, 10 times per day, [name], MD, 15 mL at 05/26/16 1000 .  midazolam (VERSED) injection 2 mg, 2 mg, Intravenous, Q2H PRN, [name], MD, 2 mg at 05/25/16 1748 .   norepinephrine (LEVOPHED) 4 mg in dextrose 5 % 250 mL (0.016 mg/mL) infusion, 0-40 mcg/min, Intravenous, Titrated, [name], MD, Stopped at 05/26/16 1000 .  ondansetron [facility]) injection 4 mg, 4 mg, Intravenous, Q6H PRN, [name], MD .  propofol (DIPRIVAN) 1000 MG/100ML infusion, 5-40 mcg/kg/min, Intravenous, Titrated, [name], MD, Stopped at 05/26/16 0915 .  sodium chloride flush (NS) 0.9 % injection 10-40 mL, 10-40 mL, Intracatheter, Q12H, [name], MD, 30 mL at 05/25/16 2233 .  sodium chloride flush (NS) 0.9 % injection 10-40 mL, 10-40 mL, Intracatheter, PRN, [name], MD .  sodium chloride flush (NS) 0.9 % injection 3 mL, 3 mL, Intravenous, Q12H, [name], MD, 3 mL at 05/25/16 2233 .  sodium chloride flush (NS) 0.9 % injection 3 mL, 3 mL, Intravenous, PRN, [name], MD .  ticagrelor [facility]) tablet 90 mg, 90 mg, Oral, BID, [name], MD, 90 mg at 05/26/16 1000  Objective:  Temp:  [97.2 F (36.2 C)-100 F (37.8 C)] 100 F (37.8 C) (01/22 1100) Pulse Rate:  [58-105] 94 (01/22 1100) Resp:  [0-29] 16 (01/22 1100) BP: (87-167)/(26-102) 95/77 (01/22 1100) SpO2:  [90 %-100 %] 95 % (01/22 1100) FiO2 (%):  [40 %-50 %] 40 % (01/22 0848)  General: WDWN Caucasian man lying in ICU bed. He is presently not on any sedation. His eyes are closed and he has no response to verbal, tactile, or noxious stimulation. He does not follow any commands. HEENT: Neck is supple without lymphadenopathy. EEG electrodes in place over scalp. ETT and OGT in place. Sclerae are anicteric. There is slight conjunctival injection. L IJ TLC in place.  CV: Regular, no murmur. Carotid pulses are 2+ and symmetric with no bruits. Distal pulses 2+ and symmetric.  Lungs: CTAB on anterior exam. Extremities: No C/C/E. Cooling pads in place.  Neuro: MS: As noted above.  CN: Pupils are equal and reactive from 2-->1 mm bilaterally. He does not blink to visual threat. His  eyes are intermittently dysconjugate. Oculocephalics are sluggish. He has brisk corneals bilaterally. His face is grossly symmetric but is partly obscured by tubes and tape. No gag with ETT manipulation. The remainder of his cranial nerves cannot be accurately assessed as he is unable to participate with the exam.  Motor: Normal bulk. Tone is reduced throughout. No spontaneous movement. No tremor or other abnormal movements are observed.  Sensation: He has triple flexion top nailbed pressure in BLE. No response to supraorbital pressure, UE nailbed pressure, or proximal pain.  DTRs and gait:Unable to assess as the patient is unable to participate with the exam.   Labs: Lab Results  Component Value Date   WBC 15.9 (H) 05/26/2016   HGB 12.1 (L) 05/26/2016   HCT 37.4 (L) 05/26/2016   PLT 227 05/26/2016   GLUCOSE 152 (H) 05/26/2016   CHOL 109 05/24/2016   TRIG 139 05/24/2016   HDL 23 (L) 05/24/2016   LDLCALC 58 05/24/2016  ALT 166 (H) 05/24/2016   AST 115 (H) 05/24/2016   NA 143 05/26/2016   K 3.7 05/26/2016   CL 108 05/26/2016   CREATININE 0.94 05/26/2016   BUN 16 05/26/2016   CO2 27 05/26/2016   TSH 3.503 05/07/2016   INR 1.13 05/25/2016   HGBA1C 9.3 (H) 05/07/2016   MICROALBUR neg 10/20/2014   CBC Latest Ref Rng & Units 05/26/2016 05/25/2016 05/24/2016  WBC 4.0 - 10.5 K/uL 15.9(H) 19.7(H) -  Hemoglobin 13.0 - 17.0 g/dL 12.1(L) 12.2(L) 12.6(L)  Hematocrit 39.0 - 52.0 % 37.4(L) 36.2(L) 37.0(L)  Platelets 150 - 400 K/uL 227 287 -    Lab Results  Component Value Date   HGBA1C 9.3 (H) 05/07/2016   Lab Results  Component Value Date   ALT 166 (H) 05/24/2016   AST 115 (H) 05/24/2016   ALKPHOS 53 05/24/2016   BILITOT 0.6 05/24/2016    Radiology:  I have personally and independently reviewed the [facility] without contrast from 05/24/16. There is no obvious acute abnormality.   Other diagnostic studies:  Continuous EEG was briefly reviewed at the bedside. This shows moderate diffuse  generalized slowing that appears more pronounced over the left hemisphere. No seizures.   A/P:   1. Anoxic brain injury: This is due to OOH cardiac arrest, presumed 2/2 ventricular arrhythmia in setting of STEMI. Per notes, family estimated his downtime could have been up to ten minutes. Treatment at this time is supportive. He has completed the mild hypothermia protocol and has been normothermic since 1/21.   2. 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 The current examination shows intact brainstem reflexes and triple flexion to pain in BLE . Continue with supportive care. Avoid hypoxemia and hypotension for even brief intervals as these are associated with worse neurologic outcomes. Fever and hyperglycemia must be aggressively treated for the same reason. We will continue to follow the exam to aid with neurologic prognosis.   3. Seizures: Last reported seizure was 1/21. I will increase Keppra to 1000 mg q12. Continue EEG recording for today, may consider stopping tomorrow if no further seizures. Seizure precautions.   No family present at the time of my visit.   This patient is critically ill and at significant risk of neurological worsening, death and care requires constant monitoring of vital signs, hemodynamics,respiratory and cardiac monitoring, neurological assessment, discussion with family, other specialists and medical decision making of high complexity. A total of 40 minutes of critical care time was spent on this case.   Melba Coon, MD [facility]

## 2016-05-26 NOTE — Progress Notes (Signed)
PULMONARY / CRITICAL CARE MEDICINE   ADMISSION DATE:  05/24/2016 CONSULTATION DATE:  05/24/2016  REFERRING MD :  Dr. Herbie Baltimore  CHIEF COMPLAINT:  STEMI/Post arrest  HISTORY OF PRESENT ILLNESS:   51 yo male with PMH of CAD(recently admitted from 1/3-1/46for MI, had PCI of Om2), HFrEF (EF 35%), DMII (on insulin), HLD and carotid disease.  Had intermittent fluid retention at home with one additional admission for this but today was in his usual state of health up until this morning. He was found sitting in a chair shaking by his family. Noted pulseless at 1441. CPR was started by family member, placed on AED and given 1 shock. Pulses returned. No medications given. EKG showed ST with LVH and repol abnormality. Code STEMI was activated. He was intubated in the ED.   He was taken to the cath lab and DES placed in Circumflex.  SUBJECTIVE: not following commands, on normothermia  VITAL SIGNS: Temp:  [97.2 F (36.2 C)-99 F (37.2 C)] 99 F (37.2 C) (01/22 0400) Pulse Rate:  [48-97] 97 (01/22 0837) Resp:  [18-29] 29 (01/22 0837) BP: (87-167)/(22-102) 167/73 (01/22 0837) SpO2:  [92 %-100 %] 94 % (01/22 0837) FiO2 (%):  [40 %-50 %] 40 % (01/22 0837) HEMODYNAMICS:   VENTILATOR SETTINGS: Vent Mode: PRVC FiO2 (%):  [40 %-50 %] 40 % Set Rate:  [18 bmp] 18 bmp Vt Set:  [570 mL] 570 mL PEEP:  [5 cmH20] 5 cmH20 Plateau Pressure:  [21 cmH20-23 cmH20] 21 cmH20 INTAKE / OUTPUT:  Intake/Output Summary (Last 24 hours) at 05/26/16 0843 Last data filed at 05/26/16 1610  Gross per 24 hour  Intake          2531.36 ml  Output             2370 ml  Net           161.36 ml    PHYSICAL EXAMINATION: General:  Sedated, ill appearing HEENT:  ETT in place,  Line left IJ WNL Cardiovascular:  RRR, no m/r/g, no JVD Lungs:  Reduced, a tr clear Abdomen:  Soft, non distended, hypoactive bowel sounds Musculoskeletal:  Normal bulk and tone Skin:  Cool, no rashes Neuro: perrl, prop rass -1, not moving ext as of  now   LABS:  CBC  Recent Labs Lab 05/24/16 1537 05/24/16 1544 05/25/16 0444 05/26/16 0425  WBC 10.4  --  19.7* 15.9*  HGB 12.9* 12.6* 12.2* 12.1*  HCT 38.8* 37.0* 36.2* 37.4*  PLT 248  --  287 227   Coag's  Recent Labs Lab 05/24/16 1537 05/25/16 0444  APTT 26 31  INR 1.07 1.13   BMET  Recent Labs Lab 05/24/16 1537 05/24/16 1544 05/24/16 1900 05/25/16 0444 05/26/16 0425  NA 138 140  --  139 143  K 5.0 4.9  --  4.8 3.7  CL 105 104  --  107 108  CO2 22  --   --  24 27  BUN 30* 37*  --  30* 16  CREATININE 1.43* 1.50* 1.10 1.26* 0.94  GLUCOSE 277* 274*  --  281* 152*   Electrolytes  Recent Labs Lab 05/24/16 1537 05/25/16 0444 05/25/16 0703 05/26/16 0425  CALCIUM 8.8* 7.8*  --  8.2*  MG  --   --  1.9  --   PHOS  --   --  3.7  --    Sepsis Markers No results for input(s): LATICACIDVEN, PROCALCITON, O2SATVEN in the last 168 hours. ABG  Recent Labs Lab 05/24/16  1950  PHART 7.317*  PCO2ART 49.9*  PO2ART 481*   Liver Enzymes  Recent Labs Lab 05/24/16 0822 05/24/16 1537  AST 18 115*  ALT 28 166*  ALKPHOS 65 53  BILITOT 0.2 0.6  ALBUMIN 4.1 3.5   Cardiac Enzymes  Recent Labs Lab 05/24/16 1900 05/25/16 0444 05/25/16 1222  TROPONINI 0.44* 0.48* 0.19*   Glucose  Recent Labs Lab 05/25/16 0811 05/25/16 1249 05/25/16 1627 05/25/16 2042 05/26/16 0025 05/26/16 0414  GLUCAP 242* 220* 183* 83 158* 141*    Imaging No results found. Lines/Tubes: ETT 1/20 >> L IJ CVL 1/20 >>  Cultures: None  Antibiotics: Unasyn 1/21>>>  Studies: CT Head 1/20 >> negative CXR 1/20 >> Endotracheal tube in satisfactory position. Worsening bilateral interstitial opacities which may reflect edema or infection. CXR 1/21 >> Tip of the left central line in the proximal SVC. No Pneumothorax. Developing hazy opacity at the right lung base, likely pleural fluid. Worsening interstitial opacities may be increasing pulmonary edema, pneumonia, or ARDS. EEG  1/20 >> moderate to severe generalized slowing.  No epileptiform discharges or seizures.  Improved some relative to burst suppression pattern seen yesterday.   ASSESSMENT / PLAN:  PULMONARY A: Intubated for airway protection Pulmonary edema Pulmonary nodule x3 right sided Patchy asp PNA likely P:   - SBT planned today, cpap 5 ps 5-10 -await neuro imrpoveemnt - Lasix 40 mg BID, despite had pos 148 cc still, consider escalation - On Fentanyl and propofol- for wua -pcxr to am again  CARDIOVASCULAR A:  Cardiogenic shock on Levophed NSVT STEMI s/p DES to Cx HFrEF P:  - on levophed 5 mics, BP is sys 163, this will be dc'ed - amiodarone gtt - ASA 81 mg QD, atorvastatin 80 mg QD, Coreg 6.25 mg BID - ticagralor 90 mg BID _MAP 65 on normo  RENAL A:   AKI Pulmonary edema Pos baalnce P:   -dc daily k, supp as needed - k todfay as lasix increase -bmet in am  kvo -lecvo will go away now  GASTROINTESTINAL A:   Transaminitis - likely 2/2 ACS P:   - Trend LFTS - Famotidine for ppx - NPO -start feeds  HEMATOLOGIC A:   Not active P:  Heparin TID SQ   INFECTIOUS A:   Concern Asp PNA P:   maintain unasyn Assess pct to limit possibly ABX  ENDOCRINE A:   Hyperglycemia  - more controlled DM-2 P:   -SSI -TF start , low 83, follow trend prior to lantus  NEUROLOGIC A:   Sedated Anoxic Brain Injury New Onset Seizures: EEG- moderate to severe generalized slowing.  No epileptiform discharges or seizures.  Improved some relative to burst suppression pattern seen yesterday.  P:   RASS goal: 0 -Propofol gtt and fentanyl gtt -Versed prn  -Keppra 500 mg BID -WUA today on prop  DVT ppx: Heparin TID FEN: NPO Peptic Ulcer ppx: Famotidine  Total critical care time: 30 min  Mcarthur Rossettianiel J. Tyson AliasFeinstein, MD, FACP Pgr: [phone] [facility]

## 2016-05-26 NOTE — Progress Notes (Signed)
This note also relates to the following rows which could not be included: SpO2 - Cannot attach notes to unvalidated device data  Pt placed back on full vent support due to low volumes.

## 2016-05-26 NOTE — Consult Note (Signed)
Consultation Note Date: 05/26/2016   Patient Name: Douglas Carr  DOB: 1966/04/02  MRN: [id number]  Age / Sex: 51 y.o., male  PCP: Wardell Honour, MD Referring Physician: [name], MD  Reason for Consultation: Establishing goals of care  HPI/Patient Profile: 51 y.o. male  with past medical history of CAD, CHF (57 - 30%), DM, recent MI 05/07/2016 who was admitted on 05/24/2016 with cardiac arrest. He was intubated.  A code STEMI was called and the patient was taken to the cath lab.  A drug eluding stent was placed in his circumflex artery. He has been on hypothermic protocol.  Despite being comatose, [name] suffered a seizure.  Neurology is on board and [name] has been started on Keppra.  Clinical Assessment and Goals of Care: After examining the patient, reviewing the records and taking report from CCM, Carole Binning and I met with the patient's wife in the conference room to discuss diagnosis prognosis, Mutual, EOL wishes, disposition and options.  Seeing her husband is just too upsetting for Douglas Carr.  I introduced Palliative Medicine as specialized medical care for people living with serious illness. It focuses on providing relief from the symptoms and stress of a serious illness. The goal is to improve quality of life for both the patient and the family.  We discussed a brief life review of the patient.  He and Douglas Carr have 3 children - 2 sons (41 and 60) and a 54 yo daughter named [name].   [name] is the light of [name]'s eye.  He enjoys nothing more than attending her travel softball and volleyball games.   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  His wife Douglas Carr is a 3rd grade Education officer, museum.  Douglas Carr understands that her husband suffered a devastating cardiac event.  In her words "it will be a miracle if he lives"  I attempted to elicit values and goals of care important to the  patient.  Douglas Carr felt strongly that her husband would not want artificial life support.  If he did improve he would not want to live in a facility.  If he can not walk and function on his own he would prefer to be let go peacefully.  The difference between aggressive medical intervention and comfort care was considered in light of the patient's goals of care.  Per Dr. Janit Pagan recommendation it is best to observe [name] for another 24 - 48 hours for improvement.  If there is no improvement it makes sense to start comfort measures.  The family was encouraged to call with questions or concerns.  PMT will continue to support holistically.   Primary Decision Maker:  NEXT OF KIN  Wife Douglas Carr.    SUMMARY OF RECOMMENDATIONS    If he can not walk and function on his own he would prefer to be let go peacefully.   Code Status/Advance Care Planning:  Full code    Symptom Management:   Per primary team.  Additional Recommendations (Limitations, Scope, Preferences):  Full Scope Treatment  Psycho-social/Spiritual:   Desire for further Chaplaincy support: yes.  Requested.  Prognosis:   Unable to determine  Discharge Planning: To Be Determined      Primary Diagnoses: Present on Admission: . Essential hypertension . Cardiac arrest with ventricular fibrillation ([place]) . Dilated cardiomyopathy ([place]) . Coronary artery disease involving native coronary artery of native heart with angina pectoris ([place]) . Cardiac arrest [facility])   I have reviewed the medical record, interviewed the patient and family, and examined the patient. The following aspects are pertinent.  Past Medical History:  Diagnosis Date  . Acute ST elevation myocardial infarction (STEMI) involving left circumflex coronary artery ([place]) 05/07/2016   PCI to Cx-OM  . Anginal pain ([place])    secondary to sm. vessel disease  . Anxiety   . Arthritis    BIL KNEE PAIN AND BIL ANKLE PAIN  . Bone spur of ankle   . 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 Coronary artery disease involving native coronary artery of native heart with angina pectoris ([place]) 05/24/2016   Remote MI at 51 years of age, Last cath 2010-diffuse non-obstructive disease; last echo 06/16/08 -normal LV function, moderate concentric hypertrophy; nuc 08/2008 no ischemia;  medical therapy; STEMI May 07 2016 - PCI to Cx-OM  . Diabetes mellitus    ON ORAL MEDICATION AND INSULIN  . Dilated cardiomyopathy ([place]) 05/24/2016   EF 325-30% by Echo post STEMI (previously 30-35%)   . Hyperlipidemia   . Hypertension   . Myocardial infarction 1996   Post MI  . Peripheral vascular disease ([place])    HAS LEFT CAROTID ARTERY STENOSIS   AND IS S/P RIGHT CAROTID ENDARTERECTOMY 2010 Last carotid dopplers 01/08/2012 wth patent endarterectomy site  . Status post coronary artery stent placement    Social History   Social History  . Marital status: Married    Spouse name: Douglas Carr  . Number of children: 3  . Years of education: N/A   Occupational History  . inspector for [employer]    Social History Main Topics  . Smoking status: Former Smoker    Packs/day: 2.00    Years: 22.00    Types: Cigarettes    Quit date: 10/23/2011  . Smokeless tobacco: Never Used  . Alcohol use 0.0 oz/week    2 - 3 Cans of beer per week     Comment: occasional  . Drug use: No  . Sexual activity: Not Asked   Other Topics Concern  . None   Social History Narrative   Pt lives with family in [place], [place].   Family History  Problem Relation Age of Onset  . Hypertension Mother   . Diabetes Mother   . Hypertension Maternal Grandfather   . Heart attack Paternal Grandfather   . Heart attack Father 29   Scheduled Meds: . ampicillin-sulbactam (UNASYN) IV  3 g Intravenous Q6H  . aspirin  81 mg Per Tube Daily  . atorvastatin  80 mg Per Tube q1800  . carvedilol  6.25 mg Per Tube BID WC  . chlorhexidine gluconate (MEDLINE KIT)  15  mL Mouth Rinse BID  . famotidine  20 mg Oral Daily  . feeding supplement (PRO-STAT SUGAR FREE 64)  60 mL Per Tube BID  . feeding supplement (VITAL HIGH PROTEIN)  1,000 mL Per Tube Q24H  . furosemide  60 mg Intravenous BID  . heparin  5,000 Units Subcutaneous Q8H  .  insulin aspart  0-15 Units Subcutaneous Q4H  . levETIRAcetam  1,000 mg Intravenous Q12H  . mouth rinse  15 mL Mouth Rinse 10 times per day  . multivitamin  15 mL Per Tube Daily  . sodium chloride flush  10-40 mL Intracatheter Q12H  . sodium chloride flush  3 mL Intravenous Q12H  . ticagrelor  90 mg Oral BID   Continuous Infusions: . amiodarone 30 mg/hr (05/26/16 1419)  . fentaNYL infusion INTRAVENOUS Stopped (05/26/16 1000)  . norepinephrine (LEVOPHED) Adult infusion Stopped (05/26/16 1000)  . propofol (DIPRIVAN) infusion Stopped (05/26/16 0915)   PRN Meds:.sodium chloride, Place/Maintain arterial line **AND** sodium chloride, acetaminophen, fentaNYL, fentaNYL (SUBLIMAZE) injection, midazolam, ondansetron (ZOFRAN) IV, sodium chloride flush, sodium chloride flush No Known Allergies Review of Systems patient intubated  Physical Exam  Well developed Well Nourished AT/[place] CV rrr Skin warm to touch despite cooling protocol.  Vital Signs: BP (!) 161/64   Pulse 99   Temp 99.3 F (37.4 C) (Core (Comment))   Resp 18   Ht 5' 9"  (1.753 m)   Wt 111.2 kg (245 lb 2.4 oz)   SpO2 93%   BMI 36.20 kg/m  Pain Assessment: CPOT       SpO2: SpO2: 93 % O2 Device:SpO2: 93 % O2 Flow Rate: .   IO: Intake/output summary:  Intake/Output Summary (Last 24 hours) at 05/26/16 1438 Last data filed at 05/26/16 1400  Gross per 24 hour  Intake          2432.63 ml  Output             2500 ml  Net           -67.37 ml    LBM: Last BM Date:  (PTA) Baseline Weight: Weight: 106.1 kg (234 lb) Most recent weight: Weight: 111.2 kg (245 lb 2.4 oz)     Palliative Assessment/Data:   Flowsheet Rows   Flowsheet Row Most Recent Value  Intake  Tab  Referral Department  Cardiology  Unit at Time of Referral  ICU  Palliative Care Primary Diagnosis  Cardiac  Date Notified  05/25/16  Palliative Care Type  Return patient Palliative Care  Reason for referral  Clarify Goals of Care  Date of Admission  05/24/16  Date first seen by Palliative Care  05/26/16  # of days Palliative referral response time  1 Day(s)  # of days IP prior to Palliative referral  1  Clinical Assessment  Palliative Performance Scale Score  10%  Psychosocial & Spiritual Assessment  Palliative Care Outcomes  Patient/Family meeting held?  Yes  Who was at the meeting?  wife  Palliative Care Outcomes  Clarified goals of care       Time Total: 70 min. Greater than 50%  of this time was spent counseling and coordinating care related to the above assessment and plan.  Signed by: Imogene Burn, PA-C Palliative Medicine Pager: [phone]  Please contact Palliative Medicine Team phone at [phone] for questions and concerns.  For individual provider: See Shea Evans

## 2016-05-26 NOTE — Progress Notes (Signed)
Chaplain visite with nurse to find the needs of the Pt. She said the Pallative team had not met with the Pt family therefore Chaplain waited until the pallative team meet with family.   05/26/16 2300  Clinical Encounter Type  Visited With Health care provider  Visit Type Follow-up  Referral From Care management  Spiritual Encounters  Spiritual Needs Emotional

## 2016-05-26 NOTE — Procedures (Signed)
  Electroencephalogram report- LTM    Data acquisition: 10-20 electrode placement.  Additional T1, T2, and EKG electrodes; 26 channel digital referential acquisition reformatted to 18 channel/7 channel coronal bipolar      Beginning time: 05/24/16 at 19 23 12   Ending time: 05/26/16 at 08 12 23 am   Day of study: day 1 , day 2    This  intensive EEG monitoring with simultaneous video monitoring was performed for this patient with unresponsiveness after cardiac arrest.  This EEG was obtained to rule out nonconvulsive status epilepticus.  Patient is sedated.  Day 1: There was no pushbutton activations events during this recording.   During first half of the recording background activities marked by bursts and suppression pattern were burst of mixed frequencies lasting between 2-3 seconds alternating with 3 seconds of attenuated background activities.  As recording progresses during second half of the recording background activities become more continuous with faster frequencies emerging.  At that time background activities marked by 0.5-2 cps attenuated broadly distributed delta slowing with superimposed faster frequencies in the theta range at times reaching 6 cps.  Appear to be slightly more prominent slowing across left hemisphere however findings are very mild.  There was no epileptiform discharges clinical subclinical seizures present.  Day 2: Background activities are marked by continuous  delta slowing with superimposed  6-7  Cps theta .   There was no epileptiform discharges, no  Clinical or  subclinical seizures present.   Clinical interpretation: This  intensive EEG monitoring with simultaneous monitoring did not record any clinical or subclinical seizures.  Background activities were marked by reactive background activities slowing is suggestive of moderate encephalopathy  of non specific etiologies. No clinical or subclinical seizures.   Clinical correlation is advised

## 2016-05-26 NOTE — Progress Notes (Signed)
Patient Name: Douglas Carr Date of Encounter: 05/26/2016  Primary Cardiologist: Dr. Serena Croissant Problem List     Principal Problem:   Cardiac arrest with ventricular fibrillation [facility]) Active Problems:   Essential hypertension   Status post coronary artery stent placement   Dilated cardiomyopathy ([place])   Coronary artery disease involving native coronary artery of native heart with angina pectoris ([place])   ST elevation myocardial infarction (STEMI) involving left circumflex coronary artery in recovery phase ([facility])   Acute encephalopathy   Cardiac arrest ([place])     Subjective   Intubated and sedated  Inpatient Medications    Scheduled Meds: . ampicillin-sulbactam (UNASYN) IV  3 g Intravenous Q6H  . aspirin  81 mg Per Tube Daily  . atorvastatin  80 mg Per Tube q1800  . carvedilol  6.25 mg Per Tube BID WC  . chlorhexidine gluconate (MEDLINE KIT)  15 mL Mouth Rinse BID  . famotidine  20 mg Oral Daily  . furosemide  40 mg Intravenous BID  . heparin  5,000 Units Subcutaneous Q8H  . insulin aspart  0-15 Units Subcutaneous Q4H  . levETIRAcetam  500 mg Intravenous Q12H  . mouth rinse  15 mL Mouth Rinse 10 times per day  . potassium chloride  20 mEq Oral Daily  . sodium chloride flush  10-40 mL Intracatheter Q12H  . sodium chloride flush  3 mL Intravenous Q12H  . ticagrelor  90 mg Oral BID   Continuous Infusions: . amiodarone 30 mg/hr (05/25/16 2230)  . fentaNYL infusion INTRAVENOUS 150 mcg/hr (05/26/16 0410)  . norepinephrine (LEVOPHED) Adult infusion 5 mcg/min (05/26/16 0000)  . propofol (DIPRIVAN) infusion 40 mcg/kg/min (05/26/16 0621)   PRN Meds: sodium chloride, Place/Maintain arterial line **AND** sodium chloride, acetaminophen, fentaNYL, fentaNYL (SUBLIMAZE) injection, midazolam, ondansetron (ZOFRAN) IV, sodium chloride flush, sodium chloride flush   Vital Signs    Vitals:   05/26/16 0300 05/26/16 0400 05/26/16 0500 05/26/16 0600  BP: 123/80 132/60 (!)  113/47 (!) 121/51  Pulse: 73 72 70 75  Resp: 18 18 18 18   Temp:  99 F (37.2 C)    TempSrc:  Core (Comment)    SpO2: 96% 92% 94% 93%  Weight:      Height:        Intake/Output Summary (Last 24 hours) at 05/26/16 0757 Last data filed at 05/26/16 6415  Gross per 24 hour  Intake          2617.36 ml  Output             2470 ml  Net           147.36 ml   Filed Weights   05/24/16 1535 05/24/16 1806 05/25/16 0346  Weight: 234 lb (106.1 kg) 234 lb 2.1 oz (106.2 kg) 245 lb 2.4 oz (111.2 kg)    Physical Exam     GEN:Ill appearing on vent.  Neck:No JVD Cardiac:RRR, no murmurs, rubs, or gallops.  Respiratory:Clear to auscultation bilaterally. Vent noise AX:ENMM, nontender, non-distended  MS:No edema; No deformity. Neuro:sedate Psych:sedate   Labs    CBC  Recent Labs  05/24/16 1537  05/25/16 0444 05/26/16 0425  WBC 10.4  --  19.7* 15.9*  NEUTROABS 5.8  --   --   --   HGB 12.9*  < > 12.2* 12.1*  HCT 38.8*  < > 36.2* 37.4*  MCV 84.9  --  85.0 86.0  PLT 248  --  287 227  < > = values in this  interval not displayed. Basic Metabolic Panel  Recent Labs  05/25/16 0444 05/25/16 0703 05/26/16 0425  NA 139  --  143  K 4.8  --  3.7  CL 107  --  108  CO2 24  --  27  GLUCOSE 281*  --  152*  BUN 30*  --  16  CREATININE 1.26*  --  0.94  CALCIUM 7.8*  --  8.2*  MG  --  1.9  --   PHOS  --  3.7  --    Liver Function Tests  Recent Labs  05/24/16 0822 05/24/16 1537  AST 18 115*  ALT 28 166*  ALKPHOS 65 53  BILITOT 0.2 0.6  PROT 6.9 6.3*  ALBUMIN 4.1 3.5   No results for input(s): LIPASE, AMYLASE in the last 72 hours. Cardiac Enzymes  Recent Labs  05/24/16 1900 05/25/16 0444 05/25/16 1222  TROPONINI 0.44* 0.48* 0.19*   BNP Invalid input(s): POCBNP D-Dimer No results for input(s): DDIMER in the last 72 hours. Hemoglobin A1C No results for input(s): HGBA1C in the last 72 hours. Fasting Lipid Panel  Recent Labs  05/24/16 1537  CHOL 109  HDL  23*  LDLCALC 58  TRIG 139  CHOLHDL 4.7   Thyroid Function Tests No results for input(s): TSH, T4TOTAL, T3FREE, THYROIDAB in the last 72 hours.  Invalid input(s): FREET3  Telemetry    NSR with freq PVCs and NSVT- Personally Reviewed  ECG    NSR, ant ST elevations, TWI - Personally Reviewed  Radiology    Ct Head Wo Contrast  Result Date: 05/24/2016 CLINICAL DATA:  51 y/o  M; seizures. EXAM: CT HEAD WITHOUT CONTRAST TECHNIQUE: Contiguous axial images were obtained from the base of the skull through the vertex without intravenous contrast. COMPARISON:  11/11/2003 CT head. FINDINGS: Brain: No evidence of acute infarction, hemorrhage, hydrocephalus, extra-axial collection or mass lesion/mass effect. Vascular: Minimal calcific atherosclerosis of cavernous internal carotid arteries. Choose Skull: Normal. Negative for fracture or focal lesion. Sinuses/Orbits: No acute finding. Other: Debris within the nasopharynx likely due to intubation. IMPRESSION: No acute intracranial abnormality identified. No significant interval change. Electronically Signed   By: Kristine Garbe M.D.   On: 05/24/2016 22:15   Dg Chest Port 1 View  Result Date: 05/25/2016 CLINICAL DATA:  Central line placement. EXAM: PORTABLE CHEST 1 VIEW COMPARISON:  Earlier this day at 41 hour FINDINGS: Tip of the left internal jugular central venous catheter projects over the proximal SVC. No pneumothorax. Endotracheal tube 5 cm from the carina. Enteric tube in place. Stable cardiomegaly allowing for differences in technique. Developing hazy opacity at the right lung base suspicious for worsening pleural effusion. Worsening diffuse interstitial opacities throughout both lungs. IMPRESSION: 1. Tip of the left central line in the proximal SVC. No pneumothorax. 2. Developing hazy opacity at the right lung base, likely pleural fluid. Worsening interstitial opacities may be increasing pulmonary edema, pneumonia, or ARDS. Electronically  Signed   By: Jeb Levering M.D.   On: 05/25/2016 00:53   Dg Chest Portable 1 View  Result Date: 05/24/2016 CLINICAL DATA:  Status post intubation. EXAM: PORTABLE CHEST 1 VIEW COMPARISON:  05/12/2016 FINDINGS: Endotracheal tube has been placed and terminates approximately 4 cm above the carina. Enteric tube courses into the left upper abdomen with tip not imaged. Cardiac silhouette is accentuated by portable AP technique and low lung volumes. Diffuse interstitial densities bilaterally have mildly increased. No large pleural effusion or pneumothorax is identified. Calcified granulomas are again seen in the  right lung. IMPRESSION: 1. Endotracheal tube in satisfactory position. 2. Worsening bilateral interstitial opacities which may reflect edema or infection. Electronically Signed   By: Sebastian Ache M.D.   On: 05/24/2016 16:44    Cardiac Studies   Cardiac Cath: 05/07/16   LV end diastolic pressure is severely elevated.  Ost LAD to Prox LAD lesion, 30 %stenosed.  Ost 2nd Diag to 2nd Diag lesion, 50 %stenosed.  Ost 1st Diag to 1st Diag lesion, 70 %stenosed.  Ost Cx to Prox Cx lesion, 50 %stenosed.  Ost 1st Mrg to 1st Mrg lesion, 35 %stenosed.  2nd Mrg lesion, 90 %stenosed.  A STENT PROMUS PREM MR 2.5X24 drug eluting stent was successfully placed.  Post intervention, there is a 0% residual stenosis.  Prox RCA to Mid RCA lesion, 50 %stenosed.  RPDA lesion, 100 %stenosed.  1. Diffuse coronary artery disease with relatively small diabetic vessels.  2. Severe segmental stenosis in the second OM. Lesion is hazy and ulcerative. This is the culprit lesion 3. Severely elevated LVEDP 4. Successful stenting of the second OM with DES.  Plan: DAPT for one year. Aggressive risk factor modification. Will treat with IV lasix for elevated LVEDP. Add ARB, beta blocker as tolerated. Assess LV function with Echo. Trend serial troponins and ECG.      Cath 05/24/16  Ost Cx to Mid Cx lesion, 70  %stenosed leading into OM2 as the main trunk of the Circumflex.  Ost 2nd Mrg to 2nd Mrg recent Promus DES 2.5 x 24 stent, - focal 90 %stenosed with what appears to be proximal edge dissection with thrombus  A STENT PROMUS PREM MR 2.5X20 drug eluting stent was successfully placed from Ostium of Circumflex into OM2, and overlaps previously placed stent.  Post intervention, there is a 0% residual stenosis.  ____________________________________________________  Suezanne Jacquet LAD to Prox LAD lesion, 30 %stenosed.  Ost 1st Diag to 1st Diag lesion, 70 %stenosed.  Ost 1st Mrg to 1st Mrg lesion, 35 %stenosed. Ost 2nd Diag to 2nd Diag lesion, 50 %stenosed.  Prox RCA to Mid RCA lesion, 65 %stenosed - lesion appears similar to prior cath, with mild progression.  Very distal RPDA lesion, 100 %stenosed.  LV end diastolic pressure is severely elevated.  There is no aortic valve stenosis.   Status post what is most likely VF or VT arrest 2 weeks post STEMI with PCI to the circumflex.  His EKG itself did not show signs of ST elevation, however given his recent STEMI, known cardiac myopathy and presentation with cardiac arrest, I felt it prudent taken to the Cath Lab.  Angiographically there appeared to be a possible proximal edge dissection with thrombus in this lesion, however there is TIMI 2 flow. This lesion was treated with an overlapping proximal stent up to the ostium of the circumflex and OM 2. Angiographically there was good result. It is quite possible at least 1 and strut is into the left main, but non flow-limiting.  Plan:  He'll be admitted to the CCU on hypothermia protocol per PCCM  Continue amiodarone overnight as he appears to have converted into sinus rhythm  Hold ARB and Imdur for now. We'll try to start beta blocker.  Cardiology will follow along while on cooling protocol.   ECHO 05/15/16 - Left ventricle: The cavity size was moderately dilated. Wall thickness was normal.  Systolic function was severely reduced. The estimated ejection fraction was in the range of 25% to 30%. Akinesis and scarring of the anteroseptal and anterior myocardium; consistent  with infarction in the distribution of the left anterior descending coronary artery. Dyskinesis of the apicalanterior myocardium. Features are consistent with a pseudonormal left ventricular filling pattern, with concomitant abnormal relaxation and increased filling pressure (grade 2 diastolic dysfunction). No evidence of thrombus. - Left atrium: The atrium was mildly dilated. - Atrial septum: No defect or patent foramen ovale was identified.  Patient Profile     51 y.o. 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 Felt 2/2 to VT/VF arrest, shocked by AED on the field. With recent STEMI, Dilated CM and presentation with cardiac arrest, he was taken back for cath. This showed possible edge dissection in circ, treated with an overlapping DES. He was placed on cooling protocol in the cath lab. Started rewarming process yesterday and had a seizure so fentanyl and propofol restarted.  -- Continue ASA/brilinta and statin   Anoxic brain injury: neurology consulted yesterday and commented: "His neurological exam shows no evidence of brainstem reflexes. Prognosis is very poor for meaningful recovery. " No active seizures seen on EEG. Palliative care has been consulted.   Cardiogenic shock: on pressors and intubated/sedated.   Chronic systolic HF/ischemic cardiomyopathy: No Bb, or ACE currently. Does not appear volume overloaded   NSVT: short bursts of NSVT (3 beats). Currently on amio gtt     Signed, Angelena Form, PA-C  05/26/2016, 7:57 AM   Patient examined and chart reviewed. Agree with  above Sedated on vent no murmur agree with stopping  Levophed as BP fine Continue keppra for seizures Palliative care and sedation/vent per CCM  [employer]

## 2016-05-26 NOTE — Progress Notes (Signed)
Initial Nutrition Assessment  DOCUMENTATION CODES:   Obesity unspecified  INTERVENTION:   Increase Vital High Protein to 45 ml/hr (1080 ml/day) Increase Prostat to 60 ml BID Provides: 1480 kcal, 154 grams protein, and 902 ml H2O.   MVI daily  NUTRITION DIAGNOSIS:   Inadequate oral intake related to inability to eat as evidenced by NPO status.  GOAL:   Provide needs based on ASPEN/SCCM guidelines  MONITOR:   TF tolerance, I & O's, Vent status  REASON FOR ASSESSMENT:   Consult Enteral/tube feeding initiation and management  ASSESSMENT:   51 yo male with PMH of CAD(recently admitted from 1/3-1/107for MI, had PCI of Om2), HFrEF (EF 35%), DMII (on insulin), HLD and carotid disease. Pt admitted with OOH arrest, now with onset seizures.   RN at bedside, no family present.  Patient is currently intubated on ventilator support Propofol: off Nutrition-Focused physical exam completed. Findings are no fat depletion, no muscle depletion, and mild edema.   Diet Order:    NPO  Skin:  Reviewed, no issues  Last BM:  unknown  Height:   Ht Readings from Last 1 Encounters:  05/24/16 5\' 9"  (1.753 m)    Weight:   Wt Readings from Last 1 Encounters:  05/25/16 245 lb 2.4 oz (111.2 kg)    Ideal Body Weight:  72.7 kg  BMI:  Body mass index is 36.2 kg/m.  Estimated Nutritional Needs:   Kcal:  [id number]  Protein:  >145 grams  Fluid:  > 1.5 L/day  EDUCATION NEEDS:   No education needs identified at this time  Kendell BaneHeather Hiyab Nhem RD, LDN, CNSC [phone] Pager [phone] After Hours Pager

## 2016-05-27 ENCOUNTER — Encounter (HOSPITAL_COMMUNITY): Payer: Self-pay

## 2016-05-27 ENCOUNTER — Inpatient Hospital Stay (HOSPITAL_COMMUNITY): Payer: BC Managed Care – PPO

## 2016-05-27 DIAGNOSIS — Z7189 Other specified counseling: Secondary | ICD-10-CM

## 2016-05-27 LAB — POCT I-STAT 3, ART BLOOD GAS (G3+)
Acid-Base Excess: 6 mmol/L — ABNORMAL HIGH (ref 0.0–2.0)
BICARBONATE: 31.3 mmol/L — AB (ref 20.0–28.0)
O2 SAT: 98 %
PCO2 ART: 50.8 mmHg — AB (ref 32.0–48.0)
TCO2: 33 mmol/L (ref 0–100)
pH, Arterial: 7.397 (ref 7.350–7.450)
pO2, Arterial: 107 mmHg (ref 83.0–108.0)

## 2016-05-27 LAB — BASIC METABOLIC PANEL
Anion gap: 9 (ref 5–15)
BUN: 28 mg/dL — ABNORMAL HIGH (ref 6–20)
CALCIUM: 8.4 mg/dL — AB (ref 8.9–10.3)
CO2: 27 mmol/L (ref 22–32)
CREATININE: 0.92 mg/dL (ref 0.61–1.24)
Chloride: 104 mmol/L (ref 101–111)
GFR calc Af Amer: >60 mL/min (ref >60)
Glucose, Bld: 168 mg/dL — ABNORMAL HIGH (ref 65–99)
POTASSIUM: 4 mmol/L (ref 3.5–5.1)
SODIUM: 140 mmol/L (ref 135–145)

## 2016-05-27 LAB — MAGNESIUM
MAGNESIUM: 2.3 mg/dL (ref 1.7–2.4)
MAGNESIUM: 2.4 mg/dL (ref 1.7–2.4)

## 2016-05-27 LAB — HEPATIC FUNCTION PANEL
ALK PHOS: 72 U/L (ref 38–126)
ALT: 84 U/L — ABNORMAL HIGH (ref 17–63)
AST: 33 U/L (ref 15–41)
Albumin: 2.8 g/dL — ABNORMAL LOW (ref 3.5–5.0)
BILIRUBIN TOTAL: 0.8 mg/dL (ref 0.3–1.2)
Total Protein: 6.1 g/dL — ABNORMAL LOW (ref 6.5–8.1)

## 2016-05-27 LAB — CBC
HEMATOCRIT: 37.1 % — AB (ref 39.0–52.0)
Hemoglobin: 12.1 g/dL — ABNORMAL LOW (ref 13.0–17.0)
MCH: 28.2 pg (ref 26.0–34.0)
MCHC: 32.6 g/dL (ref 30.0–36.0)
MCV: 86.5 fL (ref 78.0–100.0)
PLATELETS: 160 10*3/uL (ref 150–400)
RBC: 4.29 MIL/uL (ref 4.22–5.81)
RDW: 13.3 % (ref 11.5–15.5)
WBC: 15.3 10*3/uL — AB (ref 4.0–10.5)

## 2016-05-27 LAB — PHOSPHORUS
PHOSPHORUS: 3 mg/dL (ref 2.5–4.6)
Phosphorus: 2.9 mg/dL (ref 2.5–4.6)

## 2016-05-27 LAB — GLUCOSE, CAPILLARY
GLUCOSE-CAPILLARY: 203 mg/dL — AB (ref 65–99)
GLUCOSE-CAPILLARY: 166 mg/dL — AB (ref 65–99)
GLUCOSE-CAPILLARY: 211 mg/dL — AB (ref 65–99)
Glucose-Capillary: 184 mg/dL — ABNORMAL HIGH (ref 65–99)
Glucose-Capillary: 193 mg/dL — ABNORMAL HIGH (ref 65–99)
Glucose-Capillary: 161 mg/dL — ABNORMAL HIGH (ref 65–99)
Glucose-Capillary: 131 mg/dL — ABNORMAL HIGH (ref 65–99)

## 2016-05-27 LAB — TRIGLYCERIDES: TRIGLYCERIDES: 109 mg/dL (ref <150)

## 2016-05-27 LAB — PROCALCITONIN

## 2016-05-27 MED ORDER — POTASSIUM CHLORIDE 20 MEQ/15ML (10%) PO SOLN
20.0000 meq | Freq: Once | ORAL | Status: AC
  Administered 2016-05-27: 20 meq via ORAL
  Filled 2016-05-27: qty 15

## 2016-05-27 NOTE — Progress Notes (Signed)
   05/27/16 1015  Clinical Encounter Type  Visited With Patient and family together  Visit Type Follow-up  Referral From Chaplain  Consult/Referral To Chaplain  Recommendations (follow up after pallative)  Stress Factors  Family Stress Factors None identified (no family as of 10:22)  Pt. Comatose, no family present at the moment.  Chaplain will follow up as suggested by previous Chaplain.  Ministry of presence.  Chaplain Shaletha Humble A. Kennedy BuckerLunsford,  MA-PC , BA-REL/PHIL  [phone]

## 2016-05-27 NOTE — Progress Notes (Addendum)
Patient Name: Douglas Carr Date of Encounter: 05/27/2016  Primary Cardiologist: Dr. Serena Croissant Problem List     Principal Problem:   Cardiac arrest with ventricular fibrillation [facility]) Active Problems:   Essential hypertension   Status post coronary artery stent placement   Dilated cardiomyopathy ([place])   Coronary artery disease involving native coronary artery of native heart with angina pectoris ([place])   ST elevation myocardial infarction (STEMI) ([place])   Acute encephalopathy   Cardiac arrest ([place])   Anoxic brain injury ([place])   Anoxic encephalopathy ([place])   Seizures ([place])   Goals of care, counseling/discussion   Palliative care encounter   Central venous catheter in place     Subjective   Intubated and sedated. Family at bedside. Pt is calm and appears comfortable.   Inpatient Medications    Scheduled Meds: . ampicillin-sulbactam (UNASYN) IV  3 g Intravenous Q6H  . aspirin  81 mg Per Tube Daily  . atorvastatin  80 mg Per Tube q1800  . carvedilol  6.25 mg Per Tube BID WC  . chlorhexidine gluconate (MEDLINE KIT)  15 mL Mouth Rinse BID  . famotidine  20 mg Oral Daily  . feeding supplement (PRO-STAT SUGAR FREE 64)  60 mL Per Tube BID  . feeding supplement (VITAL HIGH PROTEIN)  1,000 mL Per Tube Q24H  . furosemide  60 mg Intravenous BID  . heparin  5,000 Units Subcutaneous Q8H  . insulin aspart  0-15 Units Subcutaneous Q4H  . levETIRAcetam  1,000 mg Intravenous Q12H  . mouth rinse  15 mL Mouth Rinse 10 times per day  . multivitamin  15 mL Per Tube Daily  . potassium chloride  20 mEq Oral Once  . sodium chloride flush  10-40 mL Intracatheter Q12H  . sodium chloride flush  3 mL Intravenous Q12H  . ticagrelor  90 mg Oral BID   Continuous Infusions: . amiodarone 30 mg/hr (05/27/16 0145)  . fentaNYL infusion INTRAVENOUS 150 mcg/hr (05/27/16 0300)  . norepinephrine (LEVOPHED) Adult infusion Stopped (05/26/16 1000)  . propofol (DIPRIVAN) infusion Stopped  (05/26/16 0915)   PRN Meds: sodium chloride, Place/Maintain arterial line **AND** sodium chloride, acetaminophen, fentaNYL, fentaNYL (SUBLIMAZE) injection, midazolam, ondansetron (ZOFRAN) IV, sodium chloride flush, sodium chloride flush   Vital Signs    Vitals:   05/27/16 0500 05/27/16 0600 05/27/16 0758 05/27/16 0812  BP: 136/64 (!) 141/75 138/68   Pulse: 81 82 80   Resp: 10 (!) 9    Temp: 98.1 F (36.7 C) 97.9 F (36.6 C)  98.1 F (36.7 C)  TempSrc:    Core (Comment)  SpO2: 96% 97% 99%   Weight:      Height:        Intake/Output Summary (Last 24 hours) at 05/27/16 0906 Last data filed at 05/27/16 0900  Gross per 24 hour  Intake          2344.84 ml  Output             2460 ml  Net          -115.16 ml   Filed Weights   05/24/16 1806 05/25/16 0346 05/27/16 0400  Weight: 234 lb 2.1 oz (106.2 kg) 245 lb 2.4 oz (111.2 kg) 245 lb 13 oz (111.5 kg)    Physical Exam     GEN:Ill appearing on vent.  Neck:No JVD Cardiac:RRR, no murmurs, rubs, or gallops.  Respiratory:Clear to auscultation bilaterally. Vent noise HD:QQIW, nontender, non-distended  MS:No edema; No deformity. Neuro:sedate. Opens eye  to pain and voice. Does not follow any commands Psych:sedate   Labs    CBC  Recent Labs  05/24/16 1537  05/26/16 0425 05/27/16 0331  WBC 10.4  < > 15.9* 15.3*  NEUTROABS 5.8  --   --   --   HGB 12.9*  < > 12.1* 12.1*  HCT 38.8*  < > 37.4* 37.1*  MCV 84.9  < > 86.0 86.5  PLT 248  < > 227 160  < > = values in this interval not displayed. Basic Metabolic Panel  Recent Labs  05/26/16 0425  05/26/16 1727 05/27/16 0331  NA 143  --   --  140  K 3.7  --   --  4.0  CL 108  --   --  104  CO2 27  --   --  27  GLUCOSE 152*  --   --  168*  BUN 16  --   --  28*  CREATININE 0.94  --   --  0.92  CALCIUM 8.2*  --   --  8.4*  MG  --   < > 2.1 2.3  PHOS  --   < > 2.9 3.0  < > = values in this interval not displayed. Liver Function Tests  Recent Labs   05/24/16 1537  AST 115*  ALT 166*  ALKPHOS 53  BILITOT 0.6  PROT 6.3*  ALBUMIN 3.5   No results for input(s): LIPASE, AMYLASE in the last 72 hours. Cardiac Enzymes  Recent Labs  05/24/16 1900 05/25/16 0444 05/25/16 1222  TROPONINI 0.44* 0.48* 0.19*   BNP Invalid input(s): POCBNP D-Dimer No results for input(s): DDIMER in the last 72 hours. Hemoglobin A1C No results for input(s): HGBA1C in the last 72 hours. Fasting Lipid Panel  Recent Labs  05/24/16 1537  CHOL 109  HDL 23*  LDLCALC 58  TRIG 139  CHOLHDL 4.7   Thyroid Function Tests No results for input(s): TSH, T4TOTAL, T3FREE, THYROIDAB in the last 72 hours.  Invalid input(s): FREET3  Telemetry    NSR with rare PVCs - Personally Reviewed  ECG    No new tracings  Radiology    No results found.  Cardiac Studies   Cardiac Cath: 06/08/21   LV end diastolic pressure is severely elevated.  Ost LAD to Prox LAD lesion, 30 %stenosed.  Ost 2nd Diag to 2nd Diag lesion, 50 %stenosed.  Ost 1st Diag to 1st Diag lesion, 70 %stenosed.  Ost Cx to Prox Cx lesion, 50 %stenosed.  Ost 1st Mrg to 1st Mrg lesion, 35 %stenosed.  2nd Mrg lesion, 90 %stenosed.  A STENT PROMUS PREM MR 2.5X24 drug eluting stent was successfully placed.  Post intervention, there is a 0% residual stenosis.  Prox RCA to Mid RCA lesion, 50 %stenosed.  RPDA lesion, 100 %stenosed.  1. Diffuse coronary artery disease with relatively small diabetic vessels.  2. Severe segmental stenosis in the second OM. Lesion is hazy and ulcerative. This is the culprit lesion 3. Severely elevated LVEDP 4. Successful stenting of the second OM with DES.  Plan: DAPT for one year. Aggressive risk factor modification. Will treat with IV lasix for elevated LVEDP. Add ARB, beta blocker as tolerated. Assess LV function with Echo. Trend serial troponins and ECG.      Cath 05/24/16  Ost Cx to Mid Cx lesion, 70 %stenosed leading into OM2 as the main  trunk of the Circumflex.  Ost 2nd Mrg to 2nd Mrg recent Promus DES 2.5 x 24 stent, -  focal 90 %stenosed with what appears to be proximal edge dissection with thrombus  A STENT PROMUS PREM MR 2.5X20 drug eluting stent was successfully placed from Ostium of Circumflex into OM2, and overlaps previously placed stent.  Post intervention, there is a 0% residual stenosis.  ____________________________________________________  Colon Flattery LAD to Prox LAD lesion, 30 %stenosed.  Ost 1st Diag to 1st Diag lesion, 70 %stenosed.  Ost 1st Mrg to 1st Mrg lesion, 35 %stenosed. Ost 2nd Diag to 2nd Diag lesion, 50 %stenosed.  Prox RCA to Mid RCA lesion, 65 %stenosed - lesion appears similar to prior cath, with mild progression.  Very distal RPDA lesion, 100 %stenosed.  LV end diastolic pressure is severely elevated.  There is no aortic valve stenosis.   Status post what is most likely VF or VT arrest 2 weeks post STEMI with PCI to the circumflex.  His EKG itself did not show signs of ST elevation, however given his recent STEMI, known cardiac myopathy and presentation with cardiac arrest, I felt it prudent taken to the Cath Lab.  Angiographically there appeared to be a possible proximal edge dissection with thrombus in this lesion, however there is TIMI 2 flow. This lesion was treated with an overlapping proximal stent up to the ostium of the circumflex and OM 2. Angiographically there was good result. It is quite possible at least 1 and strut is into the left main, but non flow-limiting.  Plan:  He'll be admitted to the CCU on hypothermia protocol per PCCM  Continue amiodarone overnight as he appears to have converted into sinus rhythm  Hold ARB and Imdur for now. We'll try to start beta blocker.  Cardiology will follow along while on cooling protocol.   ECHO 05/15/16 - Left ventricle: The cavity size was moderately dilated. Wall thickness was normal. Systolic function was severely reduced.  The estimated ejection fraction was in the range of 25% to 30%. Akinesis and scarring of the anteroseptal and anterior myocardium; consistent with infarction in the distribution of the left anterior descending coronary artery. Dyskinesis of the apicalanterior myocardium. Features are consistent with a pseudonormal left ventricular filling pattern, with concomitant abnormal relaxation and increased filling pressure (grade 2 diastolic dysfunction). No evidence of thrombus. - Left atrium: The atrium was mildly dilated. - Atrial septum: No defect or patent foramen ovale was identified.  Patient Profile     51 y.o. 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 Felt 2/2 to VT/VF arrest, shocked by AED on the field. With recent STEMI, Dilated CM and presentation with cardiac arrest, he was taken back for cath. This showed possible edge dissection in circ, treated with an overlapping DES. He was placed on cooling protocol in the cath lab. Rewarming is now complete. -- Continue ASA/brilinta and statin   Anoxic brain injury: neurology consulted and commented: "His neurological exam shows no evidence of brainstem reflexes. Prognosis is very poor for meaningful recovery. " No active seizures seen on EEG. Palliative care has been consulted. Today he is opening his eyes to pain and voice, but does not follow any commands.  Cardiogenic shock:  intubated/sedated. Is off pressors and maintaining blood pressure and has good urine output.   Chronic systolic HF/ischemic cardiomyopathy: No Bb, or ACE currently. Does not appear volume overloaded  NSVT: short bursts of NSVT (3 beats). Currently on amio gtt. No recent NSVT.   SignedDaune Perch, NP  05/27/2016, 9:06 AM  Pager:  [phone]  Patient examined chart reviewed. No murmur on vent no meaningful response Rhythm stable this  Am on amiodarone. Suspect he will be comfort care with terminal wean.   Jenkins Rouge

## 2016-05-27 NOTE — Progress Notes (Signed)
LTM EEG D/C'd per Dr Roxy Mannsster, no skin breakdown noted

## 2016-05-27 NOTE — Progress Notes (Addendum)
PULMONARY / CRITICAL CARE MEDICINE   ADMISSION DATE:  05/24/2016 CONSULTATION DATE:  05/24/2016  REFERRING MD :  Dr. Herbie BaltimoreHarding  CHIEF COMPLAINT:  STEMI/Post arrest  HISTORY OF PRESENT ILLNESS:   51 yo male with PMH of CAD(recently admitted from 1/3-1/427for MI, had PCI of Om2), HFrEF (EF 35%), DMII (on insulin), HLD and carotid disease.  Had intermittent fluid retention at home with one additional admission for this but today was in his usual state of health up until this morning. He was found sitting in a chair shaking by his family. Noted pulseless at 1441. CPR was started by family member, placed on AED and given 1 shock. Pulses returned. No medications given. EKG showed ST with LVH and repol abnormality. Code STEMI was activated. He was intubated in the ED.   He was taken to the cath lab and DES placed in Circumflex.  SUBJECTIVE: Vomiting overnight per nurse. Tolerated PS 5/5 well. Currently on SIMV 40%/5/5. Opened eyes to voice but does not follow other commands.  VITAL SIGNS: Temp:  [97.9 F (36.6 C)-100 F (37.8 C)] 97.9 F (36.6 C) (01/23 0600) Pulse Rate:  [81-105] 82 (01/23 0600) Resp:  [9-29] 9 (01/23 0600) BP: (95-167)/(43-82) 141/75 (01/23 0600) SpO2:  [87 %-100 %] 97 % (01/23 0600) FiO2 (%):  [40 %-60 %] 40 % (01/23 0412) Weight:  [245 lb 13 oz (111.5 kg)] 245 lb 13 oz (111.5 kg) (01/23 0400) HEMODYNAMICS:   VENTILATOR SETTINGS: Vent Mode: SIMV;Volume support;PSV FiO2 (%):  [40 %-60 %] 40 % Set Rate:  [5 bmp-18 bmp] 5 bmp Vt Set:  [570 mL-580 mL] 580 mL PEEP:  [5 cmH20] 5 cmH20 Pressure Support:  [5 cmH20-8 cmH20] 8 cmH20 Plateau Pressure:  [12 cmH20] 12 cmH20 INTAKE / OUTPUT:  Intake/Output Summary (Last 24 hours) at 05/27/16 16100711 Last data filed at 05/27/16 0600  Gross per 24 hour  Intake          2470.25 ml  Output             2650 ml  Net          -179.75 ml    PHYSICAL EXAMINATION: General:  Sedated, ill appearing man HEENT:  ETT in place,  Line left IJ  clean Cardiovascular:  RRR, no m/r/g Lungs: CTA bilaterally, reduced  Abdomen: Hypoactive BS, soft, non distended Musculoskeletal:  Normal bulk and tone Skin:  Cool, no rashes Neuro: sedated, not moving extremities, would open eyes to voice   LABS:  CBC  Recent Labs Lab 05/25/16 0444 05/26/16 0425 05/27/16 0331  WBC 19.7* 15.9* 15.3*  HGB 12.2* 12.1* 12.1*  HCT 36.2* 37.4* 37.1*  PLT 287 227 160   Coag's  Recent Labs Lab 05/24/16 1537 05/25/16 0444  APTT 26 31  INR 1.07 1.13   BMET  Recent Labs Lab 05/25/16 0444 05/26/16 0425 05/27/16 0331  NA 139 143 140  K 4.8 3.7 4.0  CL 107 108 104  CO2 24 27 27   BUN 30* 16 28*  CREATININE 1.26* 0.94 0.92  GLUCOSE 281* 152* 168*   Electrolytes  Recent Labs Lab 05/25/16 0444  05/26/16 0425 05/26/16 0852 05/26/16 1727 05/27/16 0331  CALCIUM 7.8*  --  8.2*  --   --  8.4*  MG  --   < >  --  2.2 2.1 2.3  PHOS  --   < >  --  2.8 2.9 3.0  < > = values in this interval not displayed. Sepsis Markers  Recent  Labs Lab 05/26/16 0852 05/27/16 0331  PROCALCITON <0.10 <0.10   ABG  Recent Labs Lab 05/24/16 1644 05/24/16 1950  PHART 7.215* 7.317*  PCO2ART 52.1* 49.9*  PO2ART 109.0* 481*   Liver Enzymes  Recent Labs Lab 05/24/16 0822 05/24/16 1537  AST 18 115*  ALT 28 166*  ALKPHOS 65 53  BILITOT 0.2 0.6  ALBUMIN 4.1 3.5   Cardiac Enzymes  Recent Labs Lab 05/24/16 1900 05/25/16 0444 05/25/16 1222  TROPONINI 0.44* 0.48* 0.19*   Glucose  Recent Labs Lab 05/26/16 0851 05/26/16 1154 05/26/16 1657 05/26/16 2029 05/27/16 0014 05/27/16 0425  GLUCAP 144* 138* 204* 203* 193* 184*    Imaging No results found. Lines/Tubes: ETT 1/20 >> L IJ CVL 1/20 >>  Cultures: None  Antibiotics: Unasyn 1/21>>>  Studies: CT Head 1/20 >> negative CXR 1/20 >> Endotracheal tube in satisfactory position. Worsening bilateral interstitial opacities which may reflect edema or infection. CXR 1/21 >> Tip of  the left central line in the proximal SVC. No Pneumothorax. Developing hazy opacity at the right lung base, likely pleural fluid. Worsening interstitial opacities may be increasing pulmonary edema, pneumonia, or ARDS. EEG 1/20 >> moderate to severe generalized slowing.  No epileptiform discharges or seizures.  Improved some relative to burst suppression pattern seen yesterday.   ASSESSMENT / PLAN:  PULMONARY A: Intubated for airway protection Pulmonary edema Pulmonary nodule x3 right sided Patchy asp PNA likely P:   Continue SBT Await neuro improvement Lasix 40 mg BID, responded well Negative balance  On Fentanyl and propofol- for wua pcxr in AM  CARDIOVASCULAR A:  Cardiogenic shock on Levophed NSVT STEMI s/p DES to Cx HFrEF P:  Off pressors  Amiodarone gtt ASA 81 mg QD, atorvastatin 80 mg QD, Coreg 6.25 mg BID Ticagralor 90 mg BID  RENAL A:   AKI- resolved Pulmonary edema Pos baalnce P:   Replace electrolytes as needed 20 mEq K today with Lasix  bmet in am  kvo  GASTROINTESTINAL A:   Transaminitis - likely 2/2 ACS Nutrition GI PPx P:   Repeat LFTs - wnl Famotidine for ppx NPO TFs  HEMATOLOGIC A:   DVT Ppx P:  Heparin TID SQ   INFECTIOUS A:   Concern Asp PNA P:   Unasyn, consider dc Assess pct to limit possibly ABX- PCT low x 2 Repeat CXR  ENDOCRINE A:   Hyperglycemia  - more controlled DM-2 P:   SSI TF   NEUROLOGIC A:   Sedated Anoxic Brain Injury New Onset Seizures: EEG- moderate to severe generalized slowing.  No epileptiform discharges or seizures.  Improved some relative to burst suppression pattern seen yesterday.  P:   RASS goal: 0 Propofol gtt and fentanyl gtt Versed prn  Keppra 500 mg BID WUA  DVT ppx: Heparin TID FEN: NPO, TFs Peptic Ulcer ppx: Famotidine  Rich Number, MD, MPH Internal Medicine Resident, PGY-III Pager: [phone]   STAFF NOTE: Cindi Carbon, MD FACP have personally reviewed patient's  available data, including medical history, events of note, physical examination and test results as part of my evaluation. I have discussed with resident/NP and other care providers such as pharmacist, RN and RRT. In addition, I personally evaluated patient and elicited key findings of: not awake, does open eyes, not following commands, not tracking eyes, not moving arms / legs, has gag, lungs cta anterior, edema min, maintain current MV, SIMV used now as was stacking, get abg on current settings, , after abg will consider sbt, lasix was 200  neg, maintain , chem in am , pcxr in am for edema, off pressors, amio remains, antiplat on board, role repeat echo?, feeding to goal unable as vomited, get kub, ngt to suction int, I updated wife at bedside, appears we need some longer prognostication an d MRI, likely poor neuro recovery, min secretions, pct neg x 2, dc unasyn observe, keppra The patient is critically ill with multiple organ systems failure and requires high complexity decision making for assessment and support, frequent evaluation and titration of therapies, application of advanced monitoring technologies and extensive interpretation of multiple databases.   Critical Care Time devoted to patient care services described in this note is 30 Minutes. This time reflects time of care of this signee: Rory Percy, MD FACP. This critical care time does not reflect procedure time, or teaching time or supervisory time of PA/NP/Med student/Med Resident etc but could involve care discussion time. Rest per NP/medical resident whose note is outlined above and that I agree with   Mcarthur Rossetti. Tyson Alias, MD, FACP Pgr: [phone] [facility] 05/27/2016 10:45 AM

## 2016-05-27 NOTE — Progress Notes (Signed)
eLink Physician-Brief Progress Note Patient Name: Margarita SermonsClifford M Bonnet DOB: 28-Jun-1965 MRN: [id number]   Date of Service  05/27/2016  HPI/Events of Note  Vent alarms - double clutching etc  eICU Interventions  Tolerates PS 5/5 well with excellent Tv Change to SIV/PS -back up RR 5     Intervention Category Major Interventions: Respiratory failure - evaluation and management  ALVA,RAKESH V. 05/27/2016, 12:02 AM

## 2016-05-27 NOTE — Progress Notes (Signed)
Daily Progress Note   Patient Name: Douglas Carr       Date: 05/27/2016 DOB: [birth year]  Age: 51 y.o. MRN#: [id number] Attending Physician: [name], MD Primary Care Physician: Wardell Honour, MD Admit Date: 05/24/2016  Reason for Consultation/Follow-up: Establishing goals of care and Psychosocial/spiritual support  Subjective: Patient intubated.  Spoke with son Douglas Carr) and Wife Douglas Carr) in the conference room.    Family reiterates that if he will be unable to walk and function they would like to let him go peacefully.  They are very thankful for the excellent care [facility] and the family are receiving.  We talked about EEG results.  We discussed the worst care scenario (acute decline) as well as comfort care and passing peacefully.  I advised Douglas Carr to prepare her children for the likely possibility that Douglas Carr's time is very short.  She has a lot of concerns about Douglas Carr.    Messages have been circulated on social media that Douglas Carr is improving and this is very upsetting to the family.  We discussed [facility] - in order to support their daughter Overton Mam.  Family was very firm that they would want a strong level of comfort medications if Douglas Carr was not going to make it.  They did not want him to wake up and suffer even for 1 second.   Patient Profile/HPI: 51 y.o. male  with past medical history of CAD, CHF (5 - 30%), DM, recent MI 05/07/2016 who was admitted on 05/24/2016 with cardiac arrest. He was intubated.  A code STEMI was called and the patient was taken to the cath lab.  A drug eluding stent was placed in his circumflex artery. He has been on hypothermic protocol.  Despite being comatose, [name] suffered a seizure.  Neurology is on board and [name] has been started on  Keppra.   Length of Stay: 3  Current Medications: Scheduled Meds:  . aspirin  81 mg Per Tube Daily  . atorvastatin  80 mg Per Tube q1800  . carvedilol  6.25 mg Per Tube BID WC  . chlorhexidine gluconate (MEDLINE KIT)  15 mL Mouth Rinse BID  . famotidine  20 mg Oral Daily  . feeding supplement (PRO-STAT SUGAR FREE 64)  60 mL Per Tube BID  . feeding supplement (VITAL HIGH PROTEIN)  1,000 mL Per Tube Q24H  . furosemide  60 mg Intravenous BID  . heparin  5,000 Units Subcutaneous Q8H  . insulin aspart  0-15 Units Subcutaneous Q4H  . levETIRAcetam  1,000 mg Intravenous Q12H  . mouth rinse  15 mL Mouth Rinse 10 times per day  . multivitamin  15 mL Per Tube Daily  . sodium chloride flush  10-40 mL Intracatheter Q12H  . sodium chloride flush  3 mL Intravenous Q12H  . ticagrelor  90 mg Oral BID    Continuous Infusions: . amiodarone 30 mg/hr (05/27/16 1300)  . fentaNYL infusion INTRAVENOUS 150 mcg/hr (05/27/16 1300)  . norepinephrine (LEVOPHED) Adult infusion Stopped (05/26/16 1000)  . propofol (DIPRIVAN) infusion Stopped (05/26/16 0915)    PRN Meds: sodium chloride, Place/Maintain arterial line **AND** sodium chloride, acetaminophen, fentaNYL, fentaNYL (SUBLIMAZE) injection, midazolam, ondansetron (ZOFRAN) IV, sodium chloride flush, sodium chloride flush  Physical Exam        Wd wn male, intubated.  Does not respond to my voice or light touch. CV rrr Resp intubated  Vital Signs: BP 119/71   Pulse 80   Temp 98.6 F (37 C) (Core (Comment))   Resp 10   Ht _0  (1.753 m)   Wt 111.5 kg (245 lb 13 oz) Comment: with pads  SpO2 96%   BMI 36.30 kg/m  SpO2: SpO2: 96 % O2 Device: O2 Device: Ventilator O2 Flow Rate:    Intake/output summary:  Intake/Output Summary (Last 24 hours) at 05/27/16 1406 Last data filed at 05/27/16 1300  Gross per 24 hour  Intake          2099.88 ml  Output             1875 ml  Net           224.88 ml   LBM: Last BM Date:  (pta) Baseline Weight:  Weight: 106.1 kg (234 lb) Most recent weight: Weight: 111.5 kg (245 lb 13 oz) (with pads)       Palliative Assessment/Data:    Flowsheet Rows   Flowsheet Row Most Recent Value  Intake Tab  Referral Department  Cardiology  Unit at Time of Referral  ICU  Palliative Care Primary Diagnosis  Cardiac  Date Notified  05/25/16  Palliative Care Type  Return patient Palliative Care  Reason for referral  Clarify Goals of Care  Date of Admission  05/24/16  Date first seen by Palliative Care  05/26/16  # of days Palliative referral response time  1 Day(s)  # of days IP prior to Palliative referral  1  Clinical Assessment  Palliative Performance Scale Score  10%  Psychosocial & Spiritual Assessment  Palliative Care Outcomes  Patient/Family meeting held?  Yes  Who was at the meeting?  wife  Palliative Care Outcomes  Clarified goals of care      Patient Active Problem List   Diagnosis Date Noted  . Anoxic brain injury ([place])   . Anoxic encephalopathy ([place])   . Seizures ([place])   . Goals of care, counseling/discussion   . Palliative care encounter   . Central venous catheter in place   . Cardiac arrest ([place]) 05/25/2016  . Acute encephalopathy   . Cardiac arrest with ventricular fibrillation ([place]) 05/24/2016  . Dilated cardiomyopathy ([place]) 05/24/2016  . Coronary artery disease involving native coronary artery of native heart with angina pectoris ([place]) 05/24/2016  . ST elevation myocardial infarction (STEMI) ([place])   . 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 Status post coronary artery stent placement   . Acute on chronic systolic heart failure ([place])   . Acute ST elevation myocardial infarction (STEMI) involving left circumflex coronary artery ([place]) 05/07/2016  . Essential hypertension 09/21/2013  . Diabetes mellitus ([place]) 09/21/2013  . Combined hyperlipidemia associated with type 2 diabetes mellitus 09/21/2013  . Pulmonary  nodules 10/16/2011    Palliative Care Plan    Recommendations/Plan:  PMT will continue to support.  If it becomes clear that he will not recover we will assist with withdraw of care as needed  Family very firm that if Douglas Carr is going to pass they want zero suffering.  They would want him to sleep thru it.  Goals of Care and Additional Recommendations:  Limitations on Scope of Treatment: Full Scope Treatment  Code Status:  Full code  Prognosis:   Unable to determine   Discharge Planning:  To Be Determined  Care plan was discussed with Wife, Son, CCM MD  Thank you for allowing the Palliative Medicine Team to assist in the care of this patient.  Total time spent:  35 min.     Greater than 50%  of this time was spent counseling and coordinating care related to the above assessment and plan.  Imogene Burn, PA-C Palliative Medicine  Please contact Palliative MedicineTeam phone at [phone] for questions and concerns between 7 am - 7 pm.   Please see AMION for individual provider pager numbers.

## 2016-05-27 NOTE — Procedures (Signed)
Electroencephalogram report- LTM    Beginning date or time: 05/27/2015 7:30AM Ending date or time: 05/28/2015 7:30AM  Day of study: day 1  Medications include: Per EMR  MENTAL STATUS (per technician's notes): Lethargic.  Confused  HISTORY: This 24 hours of intensive EEG monitoring with simultaneous video monitoring was performed for this patient with cardiac arrest and altered mental status. This EEG was requested to rule out subclinical electrographic seizures.  TECHNICAL DESCRIPTION:  The study consists of a continuous 16-channel multi-montage digital video EEG recording with twenty-one electrodes placed according to the International 10-20 System. Additional leads included eye leads, true temporal leads (T1, T2), and an EKG lead. Activation procedures were not done due to mental status.  REPORT: The background activity in this tracing consisted of polymorphic delta and theta background, with best of  5 Hz. The activity seemed to be somewhat reactive to tactile stimuli. No focal slowing or epileptiform activity was identified. There was mild amplitude asymmetry with higher amplitudes seen in the left hemispheric region  IMPRESSION: This is an abnormal EEG due to diffuse slowing.  CLINICAL CORRELATION: This EEG is consistent with diffuse non-specific cerebral dysfunction. No electrographic seizures were seen.

## 2016-05-27 NOTE — Progress Notes (Signed)
Neurology Progress Note  Subjective: He had some vomiting overnight, otherwise no major issues. He has had some eye opening but has not shown any purposeful activity thus far. No further seizure-like activity has been observed. He is not able to participate with the exam due to his encephalopathy.   Current Meds:   Current Facility-Administered Medications:  .  0.9 %  sodium chloride infusion, 250 mL, Intravenous, PRN, [name], MD, Last Rate: 10 mL/hr at 05/27/16 1100, 250 mL at 05/27/16 1100 .  Place/Maintain arterial line, , , Until Discontinued **AND** 0.9 %  sodium chloride infusion, , Intra-arterial, PRN, [name], MD .  acetaminophen (TYLENOL) tablet 650 mg, 650 mg, Oral, Q4H PRN, [name], MD .  amiodarone (NEXTERONE PREMIX) 360-4.14 MG/200ML-% (1.8 mg/mL) IV infusion, 30 mg/hr, Intravenous, Continuous, [name], MD, Last Rate: 16.7 mL/hr at 05/27/16 1100, 30 mg/hr at 05/27/16 1100 .  aspirin chewable tablet 81 mg, 81 mg, Per Tube, Daily, [name], MD, 81 mg at 05/27/16 1156 .  atorvastatin (LIPITOR) tablet 80 mg, 80 mg, Per Tube, q1800, [name], MD, 80 mg at 05/26/16 1703 .  carvedilol (COREG) tablet 6.25 mg, 6.25 mg, Per Tube, BID WC, [name], MD, 6.25 mg at 05/27/16 0908 .  chlorhexidine gluconate (MEDLINE KIT) (PERIDEX) 0.12 % solution 15 mL, 15 mL, Mouth Rinse, BID, [name], MD, 15 mL at 05/27/16 0909 .  famotidine (PEPCID) 40 MG/5ML suspension 20 mg, 20 mg, Oral, Daily, [name], MD, 20 mg at 05/27/16 1155 .  feeding supplement (PRO-STAT SUGAR FREE 64) liquid 60 mL, 60 mL, Per Tube, BID, [name], MD, 60 mL at 05/27/16 1154 .  feeding supplement (VITAL HIGH PROTEIN) liquid 1,000 mL, 1,000 mL, Per Tube, Q24H, [name], MD, Stopped at 05/27/16 0315 .  fentaNYL (SUBLIMAZE) 2,500 mcg in sodium chloride 0.9 % 250 mL (10 mcg/mL) infusion, 25-400 mcg/hr, Intravenous, Continuous, [name], MD, Last Rate:  15 mL/hr at 05/27/16 1100, 150 mcg/hr at 05/27/16 1100 .  fentaNYL (SUBLIMAZE) bolus via infusion 50 mcg, 50 mcg, Intravenous, Q1H PRN, [name], MD .  fentaNYL (SUBLIMAZE) injection 100 mcg, 100 mcg, Intravenous, Q15 min PRN, [name], MD .  furosemide (LASIX) injection 60 mg, 60 mg, Intravenous, BID, [name], MD, 60 mg at 05/27/16 0909 .  heparin injection 5,000 Units, 5,000 Units, Subcutaneous, Q8H, [name], MD, 5,000 Units at 05/27/16 0600 .  insulin aspart (novoLOG) injection 0-15 Units, 0-15 Units, Subcutaneous, Q4H, [name], MD, 3 Units at 05/27/16 [phone] .  levETIRAcetam (KEPPRA) 1,000 mg in sodium chloride 0.9 % 100 mL IVPB, 1,000 mg, Intravenous, Q12H, Darrel Reach, MD, 1,000 mg at 05/27/16 1200 .  MEDLINE mouth rinse, 15 mL, Mouth Rinse, 10 times per day, [name], MD, 15 mL at 05/27/16 1211 .  midazolam (VERSED) injection 2 mg, 2 mg, Intravenous, Q2H PRN, [name], MD, 2 mg at 05/25/16 1748 .  multivitamin liquid 15 mL, 15 mL, Per Tube, Daily, [name], MD, 15 mL at 05/27/16 1154 .  norepinephrine (LEVOPHED) 4 mg in dextrose 5 % 250 mL (0.016 mg/mL) infusion, 0-40 mcg/min, Intravenous, Titrated, [name], MD, Stopped at 05/26/16 1000 .  ondansetron [facility]) injection 4 mg, 4 mg, Intravenous, Q6H PRN, [name], MD, 4 mg at 05/27/16 0345 .  propofol (DIPRIVAN) 1000 MG/100ML infusion, 5-40 mcg/kg/min, Intravenous, Titrated, [name], MD, Stopped at 05/26/16  0915 .  sodium chloride flush (NS) 0.9 % injection 10-40 mL, 10-40 mL, Intracatheter, Q12H, Jose Angelo A de [place], MD, 10 mL at 05/27/16 1158 .  sodium chloride flush (NS) 0.9 % injection 10-40 mL, 10-40 mL, Intracatheter, PRN, Jose Angelo A de [place], MD .  sodium chloride flush (NS) 0.9 % injection 3 mL, 3 mL, Intravenous, Q12H, [name], MD, 3 mL at 05/27/16 1158 .  sodium chloride flush (NS) 0.9 % injection 3 mL, 3 mL, Intravenous, PRN, [name], MD .  ticagrelor [facility]) tablet 90 mg, 90 mg, Oral, BID, [name], MD, 90 mg at 05/27/16 1157  Objective:  Temp:  [97.9 F (36.6 C)-99.5 F (37.5 C)] 98.1 F (36.7 C) (01/23 0812) Pulse Rate:  [79-103] 81 (01/23 1210) Resp:  [9-24] 10 (01/23 1210) BP: (113-161)/(43-81) 126/68 (01/23 1210) SpO2:  [87 %-100 %] 96 % (01/23 1210) FiO2 (%):  [40 %-60 %] 40 % (01/23 1210) Weight:  [111.5 kg (245 lb 13 oz)] 111.5 kg (245 lb 13 oz) (01/23 0400)  General: WDWN Caucasian man lying in ICU bed. He is currently on fentanyl 150 mcg/hr. His eyes open spontaneously and with stimulation. He does not fix or track. He does not follow any commands.  HEENT: Neck is supple without lymphadenopathy. ETT and OGT in place. Sclerae are anicteric. There is mild conjunctival injection. L IJ TLC in place.  CV: Regular, no murmur. Carotid pulses are 2+ and symmetric with no bruits. Distal pulses 2+ and symmetric.  Lungs: CTAB on anterior exam. Ventilated. Extremities: No C/C/E. Cooling pads in place.  Neuro: MS: As noted above.  CN: Pupils are equal and reactive from 3-->2 mm bilaterally. He does not blink to visual threat. His eyes are intermittently dysconjugate. He has occasional roving horizontal eye movements. Oculocephalics are sluggish. He has brisk corneals bilaterally. His face is grossly symmetric but is partly obscured by tubes and tape. No gag with ETT manipulation. The remainder of his cranial nerves cannot be accurately assessed as he is unable to participate with the exam.  Motor: Normal bulk. Tone is reduced throughout. No spontaneous movement. No tremor or other abnormal movements are observed.  Sensation: He has triple flexion to nailbed pressure and Babinski in BLE. No response to supraorbital pressure, UE nailbed pressure, or proximal pain.  DTRs and gait:Unable to assess as the patient is unable to participate with the exam.   Labs: Lab Results  Component Value Date   WBC 15.3  (H) 05/27/2016   HGB 12.1 (L) 05/27/2016   HCT 37.1 (L) 05/27/2016   PLT 160 05/27/2016   GLUCOSE 168 (H) 05/27/2016   CHOL 109 05/24/2016   TRIG 139 05/24/2016   HDL 23 (L) 05/24/2016   LDLCALC 58 05/24/2016   ALT 84 (H) 05/27/2016   AST 33 05/27/2016   NA 140 05/27/2016   K 4.0 05/27/2016   CL 104 05/27/2016   CREATININE 0.92 05/27/2016   BUN 28 (H) 05/27/2016   CO2 27 05/27/2016   TSH 3.503 05/07/2016   INR 1.13 05/25/2016   HGBA1C 9.3 (H) 05/07/2016   MICROALBUR neg 10/20/2014   CBC Latest Ref Rng & Units 05/27/2016 05/26/2016 05/25/2016  WBC 4.0 - 10.5 K/uL 15.3(H) 15.9(H) 19.7(H)  Hemoglobin 13.0 - 17.0 g/dL 12.1(L) 12.1(L) 12.2(L)  Hematocrit 39.0 - 52.0 % 37.1(L) 37.4(L) 36.2(L)  Platelets 150 - 400 K/uL 160 227 287    Lab Results  Component Value Date   HGBA1C 9.3 (H)  05/07/2016   Lab Results  Component Value Date   ALT 84 (H) 05/27/2016   AST 33 05/27/2016   ALKPHOS 72 05/27/2016   BILITOT 0.8 05/27/2016    Radiology:  No new neuroimaging.   Other diagnostic studies:  Continuous EEG showed reactive background slowing with no evidence of seizure activity or epileptiform discharges. Slowing was felt to be slightly more pronounced in the left cerebral hemisphere.   A/P:   1. Anoxic brain injury: This is due to OOH cardiac arrest, presumed 2/2 ventricular arrhythmia in setting of STEMI. Per notes, family estimated his downtime could have been up to ten minutes. Treatment at this time is supportive. He has completed the mild hypothermia protocol and has been normothermic since 1/21. He is nearing 72 hours post-arrest this afternoon and has now been normothermic for over 48 hours.  2. 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 The current examination shows intact brainstem reflexes and triple flexion to pain in BLE . He has spontaneous eye opening but no blink to threat and no fixing/tracking. Continue with supportive  care. Avoid hypoxemia and hypotension for even brief intervals as these are associated with worse neurologic outcomes. Fever and hyperglycemia must be aggressively treated for the same reason. We will continue to follow the exam to aid with neurologic prognosis.   3. Seizures: Last reported clinical seizure was 1/21. However, this did not show any electrical correlate on EEG so this was likely some other hyperkinetic movement such as myoclonus. I will continue Keppra to 1000 mg q12 for now to cover against possible myoclonus. Continuous EEG has been discontinued.   No family present at the time of my visit.   This patient is critically ill and at significant risk of neurological worsening, death and care requires constant monitoring of vital signs, hemodynamics,respiratory and cardiac monitoring, neurological assessment, discussion with family, other specialists and medical decision making of high complexity. A total of 30 minutes of critical care time was spent on this case.   Melba Coon, MD [facility]

## 2016-05-28 ENCOUNTER — Telehealth: Payer: Self-pay | Admitting: Cardiovascular Disease

## 2016-05-28 ENCOUNTER — Inpatient Hospital Stay (HOSPITAL_COMMUNITY): Payer: BC Managed Care – PPO

## 2016-05-28 LAB — CBC
HEMATOCRIT: 36.2 % — AB (ref 39.0–52.0)
HEMOGLOBIN: 11.2 g/dL — AB (ref 13.0–17.0)
MCH: 27.6 pg (ref 26.0–34.0)
MCHC: 30.9 g/dL (ref 30.0–36.0)
MCV: 89.2 fL (ref 78.0–100.0)
PLATELETS: 190 10*3/uL (ref 150–400)
RBC: 4.06 MIL/uL — AB (ref 4.22–5.81)
RDW: 13.4 % (ref 11.5–15.5)
WBC: 13.2 10*3/uL — ABNORMAL HIGH (ref 4.0–10.5)

## 2016-05-28 LAB — PROCALCITONIN: Procalcitonin: <0.1 ng/mL

## 2016-05-28 LAB — BASIC METABOLIC PANEL
Anion gap: 11 (ref 5–15)
BUN: 42 mg/dL — ABNORMAL HIGH (ref 6–20)
CHLORIDE: 105 mmol/L (ref 101–111)
CO2: 30 mmol/L (ref 22–32)
Calcium: 8.7 mg/dL — ABNORMAL LOW (ref 8.9–10.3)
Creatinine, Ser: 1.2 mg/dL (ref 0.61–1.24)
GFR calc Af Amer: >60 mL/min (ref >60)
GFR calc non Af Amer: >60 mL/min (ref >60)
Glucose, Bld: 107 mg/dL — ABNORMAL HIGH (ref 65–99)
POTASSIUM: 4.1 mmol/L (ref 3.5–5.1)
Sodium: 146 mmol/L — ABNORMAL HIGH (ref 135–145)

## 2016-05-28 LAB — GLUCOSE, CAPILLARY
GLUCOSE-CAPILLARY: 95 mg/dL (ref 65–99)
GLUCOSE-CAPILLARY: 99 mg/dL (ref 65–99)
GLUCOSE-CAPILLARY: 127 mg/dL — AB (ref 65–99)
GLUCOSE-CAPILLARY: 131 mg/dL — AB (ref 65–99)
GLUCOSE-CAPILLARY: 76 mg/dL (ref 65–99)
Glucose-Capillary: 114 mg/dL — ABNORMAL HIGH (ref 65–99)

## 2016-05-28 MED ORDER — FUROSEMIDE 10 MG/ML IJ SOLN
60.0000 mg | Freq: Every day | INTRAMUSCULAR | Status: DC
  Administered 2016-05-29: 60 mg via INTRAVENOUS
  Filled 2016-05-28: qty 6

## 2016-05-28 MED ORDER — SENNOSIDES 8.8 MG/5ML PO SYRP
5.0000 mL | ORAL_SOLUTION | Freq: Two times a day (BID) | ORAL | Status: DC
  Administered 2016-05-28 – 2016-06-05 (×12): 5 mL
  Filled 2016-05-28 (×16): qty 5

## 2016-05-28 MED ORDER — DOCUSATE SODIUM 50 MG/5ML PO LIQD
100.0000 mg | Freq: Two times a day (BID) | ORAL | Status: DC
  Administered 2016-05-28 – 2016-06-15 (×22): 100 mg
  Filled 2016-05-28 (×36): qty 10

## 2016-05-28 NOTE — Progress Notes (Signed)
Patient Name: Douglas Carr Date of Encounter: 05/28/2016  Primary Cardiologist: Dr. Serena Croissant Problem List     Principal Problem:   Cardiac arrest with ventricular fibrillation [facility]) Active Problems:   Essential hypertension   Status post coronary artery stent placement   Dilated cardiomyopathy ([place])   Coronary artery disease involving native coronary artery of native heart with angina pectoris ([place])   ST elevation myocardial infarction (STEMI) ([place])   Acute encephalopathy   Cardiac arrest ([place])   Anoxic brain injury ([place])   Anoxic encephalopathy ([place])   Seizures ([place])   Goals of care, counseling/discussion   Palliative care encounter   Central venous catheter in place   Encounter for hospice care discussion     Subjective   Intubated and sedated. Pt is calm and appears comfortable. Fentanyl infusing.   Inpatient Medications    Scheduled Meds: . aspirin  81 mg Per Tube Daily  . atorvastatin  80 mg Per Tube q1800  . carvedilol  6.25 mg Per Tube BID WC  . chlorhexidine gluconate (MEDLINE KIT)  15 mL Mouth Rinse BID  . famotidine  20 mg Oral Daily  . feeding supplement (PRO-STAT SUGAR FREE 64)  60 mL Per Tube BID  . feeding supplement (VITAL HIGH PROTEIN)  1,000 mL Per Tube Q24H  . furosemide  60 mg Intravenous BID  . heparin  5,000 Units Subcutaneous Q8H  . insulin aspart  0-15 Units Subcutaneous Q4H  . levETIRAcetam  1,000 mg Intravenous Q12H  . mouth rinse  15 mL Mouth Rinse 10 times per day  . multivitamin  15 mL Per Tube Daily  . sodium chloride flush  10-40 mL Intracatheter Q12H  . sodium chloride flush  3 mL Intravenous Q12H  . ticagrelor  90 mg Oral BID   Continuous Infusions: . amiodarone 30 mg/hr (05/28/16 0900)  . fentaNYL infusion INTRAVENOUS 200 mcg/hr (05/28/16 0900)  . norepinephrine (LEVOPHED) Adult infusion Stopped (05/26/16 1000)  . propofol (DIPRIVAN) infusion Stopped (05/26/16 0915)   PRN Meds: sodium chloride, Place/Maintain  arterial line **AND** sodium chloride, acetaminophen, fentaNYL, fentaNYL (SUBLIMAZE) injection, midazolam, ondansetron (ZOFRAN) IV, sodium chloride flush, sodium chloride flush   Vital Signs    Vitals:   05/28/16 0800 05/28/16 0823 05/28/16 0848 05/28/16 0900  BP: 138/66   (!) 155/80  Pulse: 79   86  Resp: 10   10  Temp:  98.8 F (37.1 C)    TempSrc:  Oral    SpO2: 97%  100% 99%  Weight:      Height:        Intake/Output Summary (Last 24 hours) at 05/28/16 0932 Last data filed at 05/28/16 0904  Gross per 24 hour  Intake          1488.97 ml  Output             1725 ml  Net          -236.03 ml   Filed Weights   05/25/16 0346 05/27/16 0400 05/28/16 0400  Weight: 245 lb 2.4 oz (111.2 kg) 245 lb 13 oz (111.5 kg) 233 lb 7.5 oz (105.9 kg)    Physical Exam     GEN:Ill appearing on vent.  Neck:No JVD Cardiac:RRR, no murmurs, rubs, or gallops.  Respiratory:Clear to auscultation bilaterally. Vent noise UX:NATF, nontender, non-distended  MS:No edema; No deformity. Neuro:sedate. Opens eye to pain and voice. Does not follow any commands Psych:sedate   Labs    CBC  Recent Labs  05/27/16 0331 05/28/16 0430  WBC 15.3* 13.2*  HGB 12.1* 11.2*  HCT 37.1* 36.2*  MCV 86.5 89.2  PLT 160 161   Basic Metabolic Panel  Recent Labs  05/27/16 0331 05/27/16 1701 05/28/16 0430  NA 140  --  146*  K 4.0  --  4.1  CL 104  --  105  CO2 27  --  30  GLUCOSE 168*  --  107*  BUN 28*  --  42*  CREATININE 0.92  --  1.20  CALCIUM 8.4*  --  8.7*  MG 2.3 2.4  --   PHOS 3.0 2.9  --    Liver Function Tests  Recent Labs  05/27/16 0331  AST 33  ALT 84*  ALKPHOS 72  BILITOT 0.8  PROT 6.1*  ALBUMIN 2.8*   No results for input(s): LIPASE, AMYLASE in the last 72 hours. Cardiac Enzymes  Recent Labs  05/25/16 1222  TROPONINI 0.19*   BNP Invalid input(s): POCBNP D-Dimer No results for input(s): DDIMER in the last 72 hours. Hemoglobin A1C No results for input(s):  HGBA1C in the last 72 hours. Fasting Lipid Panel  Recent Labs  05/27/16 2030  TRIG 109   Thyroid Function Tests No results for input(s): TSH, T4TOTAL, T3FREE, THYROIDAB in the last 72 hours.  Invalid input(s): FREET3  Telemetry    NSR with rates in the 80's - Personally Reviewed  ECG    No new tracings  Radiology    Dg Chest Port 1 View  Result Date: 05/28/2016 CLINICAL DATA:  Shortness of breath. EXAM: PORTABLE CHEST 1 VIEW COMPARISON:  05/24/2016 .  05/07/2016.  CT 04/12/2012 . FINDINGS: Endotracheal tube, left IJ line, NG tube in stable position. Cardiomegaly. Diffuse interstitial prominence with bilateral pleural effusions noted. These findings are consistent with congestive heart failure. Low lung volumes. Calcified nodular densities noted on the right consistent with granulomas. No acute bony abnormality . IMPRESSION: 1. Lines and tubes in stable position. 2. Cardiomegaly with bilateral interstitial prominence suggesting congestive heart failure. Pneumonitis cannot be excluded. Small left pleural effusion. Electronically Signed   By: Marcello Moores  Register   On: 05/28/2016 07:55   Dg Abd Portable 1v  Result Date: 05/27/2016 CLINICAL DATA:  Orogastric tube placement EXAM: PORTABLE ABDOMEN - 1 VIEW COMPARISON:  None. FINDINGS: Orogastric tube tip and side port in stomach. There is diffuse stool throughout the colon. No bowel dilatation or air-fluid level suggesting bowel obstruction. No free air. IMPRESSION: Orogastric tube tip and side port in stomach. Stool throughout colon. No bowel obstruction or free air evident. Electronically Signed   By: Lowella Grip III M.D.   On: 05/27/2016 11:23    Cardiac Studies   Cardiac Cath: 0/9/60   LV end diastolic pressure is severely elevated.  Ost LAD to Prox LAD lesion, 30 %stenosed.  Ost 2nd Diag to 2nd Diag lesion, 50 %stenosed.  Ost 1st Diag to 1st Diag lesion, 70 %stenosed.  Ost Cx to Prox Cx lesion, 50 %stenosed.  Ost 1st  Mrg to 1st Mrg lesion, 35 %stenosed.  2nd Mrg lesion, 90 %stenosed.  A STENT PROMUS PREM MR 2.5X24 drug eluting stent was successfully placed.  Post intervention, there is a 0% residual stenosis.  Prox RCA to Mid RCA lesion, 50 %stenosed.  RPDA lesion, 100 %stenosed.  1. Diffuse coronary artery disease with relatively small diabetic vessels.  2. Severe segmental stenosis in the second OM. Lesion is hazy and ulcerative. This is the culprit lesion 3. Severely elevated LVEDP 4.  Successful stenting of the second OM with DES.  Plan: DAPT for one year. Aggressive risk factor modification. Will treat with IV lasix for elevated LVEDP. Add ARB, beta blocker as tolerated. Assess LV function with Echo. Trend serial troponins and ECG.      Cath 05/24/16  Ost Cx to Mid Cx lesion, 70 %stenosed leading into OM2 as the main trunk of the Circumflex.  Ost 2nd Mrg to 2nd Mrg recent Promus DES 2.5 x 24 stent, - focal 90 %stenosed with what appears to be proximal edge dissection with thrombus  A STENT PROMUS PREM MR 2.5X20 drug eluting stent was successfully placed from Ostium of Circumflex into OM2, and overlaps previously placed stent.  Post intervention, there is a 0% residual stenosis.  ____________________________________________________  Colon Flattery LAD to Prox LAD lesion, 30 %stenosed.  Ost 1st Diag to 1st Diag lesion, 70 %stenosed.  Ost 1st Mrg to 1st Mrg lesion, 35 %stenosed. Ost 2nd Diag to 2nd Diag lesion, 50 %stenosed.  Prox RCA to Mid RCA lesion, 65 %stenosed - lesion appears similar to prior cath, with mild progression.  Very distal RPDA lesion, 100 %stenosed.  LV end diastolic pressure is severely elevated.  There is no aortic valve stenosis.   Status post what is most likely VF or VT arrest 2 weeks post STEMI with PCI to the circumflex.  His EKG itself did not show signs of ST elevation, however given his recent STEMI, known cardiac myopathy and presentation with cardiac  arrest, I felt it prudent taken to the Cath Lab.  Angiographically there appeared to be a possible proximal edge dissection with thrombus in this lesion, however there is TIMI 2 flow. This lesion was treated with an overlapping proximal stent up to the ostium of the circumflex and OM 2. Angiographically there was good result. It is quite possible at least 1 and strut is into the left main, but non flow-limiting.  Plan:  He'll be admitted to the CCU on hypothermia protocol per PCCM  Continue amiodarone overnight as he appears to have converted into sinus rhythm  Hold ARB and Imdur for now. We'll try to start beta blocker.  Cardiology will follow along while on cooling protocol.   ECHO 05/15/16 - Left ventricle: The cavity size was moderately dilated. Wall thickness was normal. Systolic function was severely reduced. The estimated ejection fraction was in the range of 25% to 30%. Akinesis and scarring of the anteroseptal and anterior myocardium; consistent with infarction in the distribution of the left anterior descending coronary artery. Dyskinesis of the apicalanterior myocardium. Features are consistent with a pseudonormal left ventricular filling pattern, with concomitant abnormal relaxation and increased filling pressure (grade 2 diastolic dysfunction). No evidence of thrombus. - Left atrium: The atrium was mildly dilated. - Atrial septum: No defect or patent foramen ovale was identified.  Patient Profile     51 y.o. 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 Felt 2/2 to VT/VF arrest, shocked by AED on the field. With recent STEMI, Dilated CM and presentation with cardiac arrest, he was taken back for cath. This showed possible edge dissection in circ,  treated with an overlapping DES. He was placed on cooling protocol in the cath lab. Rewarming is now complete. -- Continue ASA/brilinta and statin   Anoxic  brain injury: neurology consulted and commented: "His neurological exam shows no evidence of brainstem reflexes. Prognosis is very poor for meaningful recovery. " No active seizures seen on EEG. Palliative care has been consulted. He is opening his eyes partially to pain and voice, but does not make eye contact or follow any commands.  Cardiogenic shock:  intubated/sedated. Is off pressors and maintaining blood pressure and has good urine output. Creatinine stable.  Chronic systolic HF/ischemic cardiomyopathy: No Bb, or ACE currently. Does not appear volume overloaded   NSVT: short bursts of NSVT (3 beats). Currently on amio gtt. No recent NSVT.   Jenkins Rouge

## 2016-05-28 NOTE — Progress Notes (Signed)
Patient transported on ventilator to MRI and back with no complications. Vitals stable.

## 2016-05-28 NOTE — Telephone Encounter (Signed)
Received Attending Physicians Statement Cor[facility] Gubler[facility]--Forms given to [place] for Dr Allyson SabalBerry to review, complete and sign.lp

## 2016-05-28 NOTE — Progress Notes (Signed)
Neurology Progress Note  Subjective: No significant overnight events. He has eye opening but no purposeful activity. He remains intubated and is sedated with fentanyl. He is not able to participate with the exam due to his encephalopathy.   Pertinent meds:  Fentanyl drip 200 mcg/hr Propofol drip stopped 05/26/16 at 0915  Current Meds:   Current Facility-Administered Medications:  .  0.9 %  sodium chloride infusion, 250 mL, Intravenous, PRN, [name], MD, Last Rate: 10 mL/hr at 05/27/16 1800, 250 mL at 05/27/16 1800 .  Place/Maintain arterial line, , , Until Discontinued **AND** 0.9 %  sodium chloride infusion, , Intra-arterial, PRN, [name], MD .  acetaminophen (TYLENOL) tablet 650 mg, 650 mg, Oral, Q4H PRN, [name], MD .  amiodarone (NEXTERONE PREMIX) 360-4.14 MG/200ML-% (1.8 mg/mL) IV infusion, 30 mg/hr, Intravenous, Continuous, [name], MD, Last Rate: 16.7 mL/hr at 05/28/16 0115, 30 mg/hr at 05/28/16 0115 .  aspirin chewable tablet 81 mg, 81 mg, Per Tube, Daily, [name], MD, 81 mg at 05/27/16 1156 .  atorvastatin (LIPITOR) tablet 80 mg, 80 mg, Per Tube, q1800, [name], MD, 80 mg at 05/27/16 1823 .  carvedilol (COREG) tablet 6.25 mg, 6.25 mg, Per Tube, BID WC, [name], MD, 6.25 mg at 05/27/16 1701 .  chlorhexidine gluconate (MEDLINE KIT) (PERIDEX) 0.12 % solution 15 mL, 15 mL, Mouth Rinse, BID, [name], MD, 15 mL at 05/27/16 2054 .  famotidine (PEPCID) 40 MG/5ML suspension 20 mg, 20 mg, Oral, Daily, [name], MD, 20 mg at 05/27/16 1155 .  feeding supplement (PRO-STAT SUGAR FREE 64) liquid 60 mL, 60 mL, Per Tube, BID, [name], MD, 60 mL at 05/27/16 2210 .  feeding supplement (VITAL HIGH PROTEIN) liquid 1,000 mL, 1,000 mL, Per Tube, Q24H, [name], MD, Stopped at 05/27/16 0315 .  fentaNYL (SUBLIMAZE) 2,500 mcg in sodium chloride 0.9 % 250 mL (10 mcg/mL) infusion, 25-400 mcg/hr, Intravenous, Continuous,  [name], MD, Last Rate: 20 mL/hr at 05/28/16 0636, 200 mcg/hr at 05/28/16 0636 .  fentaNYL (SUBLIMAZE) bolus via infusion 50 mcg, 50 mcg, Intravenous, Q1H PRN, [name], MD .  fentaNYL (SUBLIMAZE) injection 100 mcg, 100 mcg, Intravenous, Q15 min PRN, [name], MD .  furosemide (LASIX) injection 60 mg, 60 mg, Intravenous, BID, [name], MD, 60 mg at 05/27/16 1823 .  heparin injection 5,000 Units, 5,000 Units, Subcutaneous, Q8H, [name], MD, 5,000 Units at 05/28/16 [phone] .  insulin aspart (novoLOG) injection 0-15 Units, 0-15 Units, Subcutaneous, Q4H, [name], MD, 2 Units at 05/27/16 2055 .  levETIRAcetam (KEPPRA) 1,000 mg in sodium chloride 0.9 % 100 mL IVPB, 1,000 mg, Intravenous, Q12H, Darrel Reach, MD, 1,000 mg at 05/27/16 2211 .  MEDLINE mouth rinse, 15 mL, Mouth Rinse, 10 times per day, [name], MD, 15 mL at 05/28/16 [phone] .  midazolam (VERSED) injection 2 mg, 2 mg, Intravenous, Q2H PRN, [name], MD, 2 mg at 05/25/16 1748 .  multivitamin liquid 15 mL, 15 mL, Per Tube, Daily, [name], MD, 15 mL at 05/27/16 1154 .  norepinephrine (LEVOPHED) 4 mg in dextrose 5 % 250 mL (0.016 mg/mL) infusion, 0-40 mcg/min, Intravenous, Titrated, [name], MD, Stopped at 05/26/16 1000 .  ondansetron [facility]) injection 4 mg, 4 mg, Intravenous, Q6H PRN, [name], MD, 4 mg at 05/27/16 0345 .  propofol (DIPRIVAN) 1000 MG/100ML infusion, 5-40 mcg/kg/min, Intravenous, Titrated, [name], MD,  Stopped at 05/26/16 0915 .  sodium chloride flush (NS) 0.9 % injection 10-40 mL, 10-40 mL, Intracatheter, Q12H, Jose Angelo A de Culver, MD, 10 mL at 05/27/16 2200 .  sodium chloride flush (NS) 0.9 % injection 10-40 mL, 10-40 mL, Intracatheter, PRN, Jose Angelo A de Carlton, MD .  sodium chloride flush (NS) 0.9 % injection 3 mL, 3 mL, Intravenous, Q12H, [name], MD, 3 mL at 05/27/16 2211 .  sodium chloride flush (NS) 0.9 % injection 3 mL, 3 mL,  Intravenous, PRN, [name], MD .  ticagrelor [facility]) tablet 90 mg, 90 mg, Oral, BID, [name], MD, 90 mg at 05/27/16 2210  Objective:  Temp:  [97 F (36.1 C)-98.8 F (37.1 C)] 98.1 F (36.7 C) (01/24 0400) Pulse Rate:  [73-87] 78 (01/24 0700) Resp:  [10-22] 10 (01/24 0700) BP: (114-142)/(54-81) 135/69 (01/24 0700) SpO2:  [93 %-100 %] 97 % (01/24 0700) FiO2 (%):  [40 %] 40 % (01/24 0412) Weight:  [105.9 kg (233 lb 7.5 oz)] 105.9 kg (233 lb 7.5 oz) (01/24 0400)  General: WDWN Caucasian man lying in ICU bed. He is currently on fentanyl 200 mcg/hr. His eyes open spontaneously to verbal and tactile stimulation. He does not fix or track. He does not follow any commands.  HEENT: Neck is supple without lymphadenopathy. ETT and OGT in place. Sclerae are anicteric. There is mild conjunctival injection. L IJ TLC in place.  CV: Regular, no murmur. Carotid pulses are 2+ and symmetric with no bruits. Distal pulses 2+ and symmetric.  Lungs: CTAB on anterior exam. Ventilated. Extremities: No C/C/E.  Neuro: MS: As noted above.  CN: Pupils are equal and reactive from 3-->2 mm bilaterally. He does not blink to visual threat. His eyes are conjugate. Oculocephalics are sluggish. He has brisk corneals bilaterally. His face is grossly symmetric but is partly obscured by tubes and tape. No gag with ETT manipulation. The remainder of his cranial nerves cannot be accurately assessed as he is unable to participate with the exam.  Motor: Normal bulk. Tone is reduced throughout. No spontaneous movement. No tremor or other abnormal movements are observed.  Sensation: He has triple flexion to nailbed pressure and Babinski in BLE. No response to supraorbital pressure, UE nailbed pressure, or proximal pain.  DTRs and gait:Unable to assess as the patient is unable to participate with the exam.   Labs: Lab Results  Component Value Date   WBC 13.2 (H) 05/28/2016   HGB 11.2 (L) 05/28/2016   HCT 36.2 (L)  05/28/2016   PLT 190 05/28/2016   GLUCOSE 107 (H) 05/28/2016   CHOL 109 05/24/2016   TRIG 109 05/27/2016   HDL 23 (L) 05/24/2016   LDLCALC 58 05/24/2016   ALT 84 (H) 05/27/2016   AST 33 05/27/2016   NA 146 (H) 05/28/2016   K 4.1 05/28/2016   CL 105 05/28/2016   CREATININE 1.20 05/28/2016   BUN 42 (H) 05/28/2016   CO2 30 05/28/2016   TSH 3.503 05/07/2016   INR 1.13 05/25/2016   HGBA1C 9.3 (H) 05/07/2016   MICROALBUR neg 10/20/2014   CBC Latest Ref Rng & Units 05/28/2016 05/27/2016 05/26/2016  WBC 4.0 - 10.5 K/uL 13.2(H) 15.3(H) 15.9(H)  Hemoglobin 13.0 - 17.0 g/dL 11.2(L) 12.1(L) 12.1(L)  Hematocrit 39.0 - 52.0 % 36.2(L) 37.1(L) 37.4(L)  Platelets 150 - 400 K/uL 190 160 227    Lab Results  Component Value Date   HGBA1C 9.3 (H) 05/07/2016   Lab Results  Component  Value Date   ALT 84 (H) 05/27/2016   AST 33 05/27/2016   ALKPHOS 72 05/27/2016   BILITOT 0.8 05/27/2016    Radiology:  No new neuroimaging.    A/P:   1. Anoxic brain injury: This is due to OOH cardiac arrest, presumed 2/2 ventricular arrhythmia in setting of STEMI. Per notes, family estimated his downtime could have been up to ten minutes. Treatment at this time is supportive. He has completed the mild hypothermia protocol and has been normothermic since 1/21. Today is day #4 post-arrest this afternoon and he has been normothermic for over 3 days.  2. 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 The current examination shows intact brainstem reflexes and triple flexion to pain in BLE; this is unchanged from yesterday. 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 Continue with supportive care. Avoid hypoxemia and hypotension for even brief intervals as these are associated with worse neurologic outcomes. Fever and hyperglycemia must be aggressively treated for the same reason.  We will continue to follow the exam to aid with neurologic prognosis, which is guarded at this time. MRI brain has been ordered by primary team and I will review this upon completion.   3. Possible myoclonus: He was reported to have clinical seizure activity but this did not have an electrical correlate on EEG, essentially excluding epileptic seizure. Myoclonus is possible, however. I will continue Keppra to 1000 mg q12 for now.  No family present at the time of my visit.   This patient is critically ill and at significant risk of neurological worsening, death and care requires constant monitoring of vital signs, hemodynamics,respiratory and cardiac monitoring, neurological assessment, discussion with family, other specialists and medical decision making of high complexity. A total of 30 minutes of critical care time was spent on this case.   Melba Coon, MD [facility]

## 2016-05-28 NOTE — Progress Notes (Addendum)
Daily Progress Note   Patient Name: Douglas Carr       Date: 05/28/2016 DOB: 11-Nov-1965  Age: 51 y.o. MRN#: [id number] Attending Physician: [name], MD Primary Care Physician: Wardell Honour, MD Admit Date: 05/24/2016  Reason for Consultation/Follow-up: Establishing goals of care and Psychosocial/spiritual support  Subjective: Patient intubated.  Family meeting held primarily to bring Douglas Carr into the loop of information and help her to begin to accept what is happening.  The meeting was attended by Douglas Carr (son), Douglas Carr (Carr), and Douglas Carr (close family friend).  Current status was given.  Each attending expressed their feelings.  The group united on hoping for the best but preparing for the worst.  It did become clear that this family does not want to have to make the decision to "pull the plug".   In the worse case scenario, they need the team (MDs and this palliative PA) to make a clear recommendation and tell them what is going to happen next.  They are unable to continue to shoulder the responsibility of making life and death decisions for Douglas Carr.   Patient Profile/HPI: 51 y.o. male  with past medical history of CAD, CHF (20 - 30%), DM, recent MI 05/07/2016 who was admitted on 05/24/2016 with cardiac arrest. He was intubated.  A code STEMI was called and the patient was taken to the cath lab.  A drug eluding stent was placed in his circumflex artery. He has been on hypothermic protocol.  Despite being comatose, Douglas Carr suffered a seizure.  Neurology is on board and Douglas Carr has been started on Keppra.   Length of Stay: 4  Current Medications: Scheduled Meds:  . aspirin  81 mg Per Tube Daily  . atorvastatin  80 mg Per Tube q1800  . carvedilol   6.25 mg Per Tube BID WC  . chlorhexidine gluconate (MEDLINE KIT)  15 mL Mouth Rinse BID  . famotidine  20 mg Oral Daily  . feeding supplement (PRO-STAT SUGAR FREE 64)  60 mL Per Tube BID  . feeding supplement (VITAL HIGH PROTEIN)  1,000 mL Per Tube Q24H  . furosemide  60 mg Intravenous BID  . heparin  5,000 Units Subcutaneous Q8H  . insulin aspart  0-15 Units Subcutaneous Q4H  . levETIRAcetam  1,000 mg Intravenous Q12H  .  mouth rinse  15 mL Mouth Rinse 10 times per day  . multivitamin  15 mL Per Tube Daily  . sodium chloride flush  10-40 mL Intracatheter Q12H  . sodium chloride flush  3 mL Intravenous Q12H  . ticagrelor  90 mg Oral BID    Continuous Infusions: . amiodarone 30 mg/hr (05/28/16 1000)  . fentaNYL infusion INTRAVENOUS 200 mcg/hr (05/28/16 1000)  . norepinephrine (LEVOPHED) Adult infusion Stopped (05/26/16 1000)  . propofol (DIPRIVAN) infusion Stopped (05/26/16 0915)    PRN Meds: sodium chloride, Place/Maintain arterial line **AND** sodium chloride, acetaminophen, fentaNYL, fentaNYL (SUBLIMAZE) injection, midazolam, ondansetron (ZOFRAN) IV, sodium chloride flush, sodium chloride flush  Physical Exam        Wd wn male, intubated.  Does not respond to my voice or light touch. CV rrr.  Distant heart sounds Resp intubated Abdomen  - area of firmness (stool mass?) found in right lower abdomen.  Vital Signs: BP 125/72   Pulse 79   Temp 98.8 F (37.1 C) (Oral)   Resp 10   Ht _0  (1.753 m)   Wt 105.9 kg (233 lb 7.5 oz)   SpO2 99%   BMI 34.48 kg/m  SpO2: SpO2: 99 % O2 Device: O2 Device: Ventilator O2 Flow Rate:    Intake/output summary:   Intake/Output Summary (Last 24 hours) at 05/28/16 1126 Last data filed at 05/28/16 1000  Gross per 24 hour  Intake          1452.27 ml  Output             1575 ml  Net          -122.73 ml   LBM: Last BM Date:  (pta) Baseline Weight: Weight: 106.1 kg (234 lb) Most recent weight: Weight: 105.9 kg (233 lb 7.5 oz)         Palliative Assessment/Data:    Flowsheet Rows   Flowsheet Row Most Recent Value  Intake Tab  Referral Department  Cardiology  Unit at Time of Referral  ICU  Palliative Care Primary Diagnosis  Cardiac  Date Notified  05/25/16  Palliative Care Type  Return patient Palliative Care  Reason for referral  Clarify Goals of Care  Date of Admission  05/24/16  Date first seen by Palliative Care  05/26/16  # of days Palliative referral response time  1 Day(s)  # of days IP prior to Palliative referral  1  Clinical Assessment  Palliative Performance Scale Score  10%  Psychosocial & Spiritual Assessment  Palliative Care Outcomes  Patient/Family meeting held?  Yes  Who was at the meeting?  wife  Palliative Care Outcomes  Clarified goals of care      Patient Active Problem List   Diagnosis Date Noted  . Encounter for hospice care discussion   . Anoxic brain injury ([place])   . Anoxic encephalopathy ([place])   . Seizures ([place])   . Goals of care, counseling/discussion   . Palliative care encounter   . Central venous catheter in place   . Cardiac arrest ([place]) 05/25/2016  . Acute encephalopathy   . Cardiac arrest with ventricular fibrillation ([name]) 05/24/2016  . Dilated cardiomyopathy ([place]) 05/24/2016  . Coronary artery disease involving native coronary artery of native heart with angina pectoris ([place]) 05/24/2016  . ST elevation myocardial infarction (STEMI) ([place])   . 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 Status post coronary artery stent placement   . Acute on  chronic systolic heart failure ([place])   . Acute ST elevation myocardial infarction (STEMI) involving left circumflex coronary artery ([place]) 05/07/2016  . Essential hypertension 09/21/2013  . Diabetes mellitus ([place]) 09/21/2013  . Combined hyperlipidemia associated with type 2 diabetes mellitus 09/21/2013  . Pulmonary nodules 10/16/2011    Palliative Care Plan     Recommendations/Plan:  PMT will continue to support and guide the family.  If it becomes clear that he will not recover we will assist with withdraw of care as needed  Family very firm that if Douglas Carr is going to pass they want zero suffering.  They would want him to sleep thru it.  IF he is not able to speak and be function they want to let him pass  The family needs clear direction from the medical team - rather than offering choices about continued artificial life support.  Consider a suppository or bowel regimen  Goals of Care and Additional Recommendations:  Limitations on Scope of Treatment: Full Scope Treatment  Code Status:  Full code  Prognosis:   Unable to determine   Discharge Planning:  To Be Determined  Care plan was discussed with Wife, Son, extended family, bed side RNs, CCM MD  Thank you for allowing the Palliative Medicine Team to assist in the care of this patient.  Total time spent:  60  min.     Greater than 50%  of this time was spent counseling and coordinating care related to the above assessment and plan.  Imogene Burn, PA-C Palliative Medicine  Please contact Palliative MedicineTeam phone at [phone] for questions and concerns between 7 am - 7 pm.   Please see AMION for individual provider pager numbers.

## 2016-05-28 NOTE — Progress Notes (Signed)
PULMONARY / CRITICAL CARE MEDICINE   ADMISSION DATE:  05/24/2016 CONSULTATION DATE:  05/24/2016  REFERRING MD :  Dr. Ellyn Hack  CHIEF COMPLAINT:  STEMI/Post arrest  HISTORY OF PRESENT ILLNESS:   51 yo male with PMH of CAD(recently admitted from 1/3-1/64fr MI, had PCI of Om2), HFrEF (EF 35%), DMII (on insulin), HLD and carotid disease.  Had intermittent fluid retention at home with one additional admission for this but today was in his usual state of health up until this morning. He was found sitting in a chair shaking by his family. Noted pulseless at 1441. CPR was started by family member, placed on AED and given 1 shock. Pulses returned. No medications given. EKG showed ST with LVH and repol abnormality. Code STEMI was activated. He was intubated in the ED.   He was taken to the cath lab and DES placed in Circumflex.  SUBJECTIVE:  Per nursing, he withdrew his feet/legs when being washed. Opens eyes to voice, but does not follow other commands, does not track. Palliative care met with family yesterday and family is very firm that they want comfort measures if COzzie Hoyleis not going to be functional.   VITAL SIGNS: Temp:  [97 F (36.1 C)-98.8 F (37.1 C)] 98.1 F (36.7 C) (01/24 0400) Pulse Rate:  [73-87] 78 (01/24 0700) Resp:  [10-22] 10 (01/24 0700) BP: (114-142)/(54-81) 135/69 (01/24 0700) SpO2:  [93 %-100 %] 97 % (01/24 0700) FiO2 (%):  [40 %] 40 % (01/24 0412) Weight:  [233 lb 7.5 oz (105.9 kg)] 233 lb 7.5 oz (105.9 kg) (01/24 0400) HEMODYNAMICS:   VENTILATOR SETTINGS: Vent Mode: PRVC FiO2 (%):  [40 %] 40 % Set Rate:  [10 bmp] 10 bmp Vt Set:  [580 mL] 580 mL PEEP:  [5 cmH20] 5 cmH20 Pressure Support:  [8 cmH20] 8 cmH20 Plateau Pressure:  [17 cmH20] 17 cmH20 INTAKE / OUTPUT:  Intake/Output Summary (Last 24 hours) at 05/28/16 0724 Last data filed at 05/28/16 0700  Gross per 24 hour  Intake          1407.67 ml  Output             1535 ml  Net          -127.33 ml    PHYSICAL  EXAMINATION: General:  Sedated, ill appearing man on vent HEENT:  ETT in place,  Line left IJ clean Cardiovascular:  RRR, no m/r/g Lungs: CTA bilaterally, reduced  Abdomen: Hypoactive BS, soft, distended Musculoskeletal:  Normal bulk and tone Skin:  Cool, no rashes  Neuro: sedated, not moving extremities, opens eyes to voice   LABS:  CBC  Recent Labs Lab 05/26/16 0425 05/27/16 0331 05/28/16 0430  WBC 15.9* 15.3* 13.2*  HGB 12.1* 12.1* 11.2*  HCT 37.4* 37.1* 36.2*  PLT 227 160 190   Coag's  Recent Labs Lab 05/24/16 1537 05/25/16 0444  APTT 26 31  INR 1.07 1.13   BMET  Recent Labs Lab 05/26/16 0425 05/27/16 0331 05/28/16 0430  NA 143 140 146*  K 3.7 4.0 4.1  CL 108 104 105  CO2 _0 BUN 16 28* 42*  CREATININE 0.94 0.92 1.20  GLUCOSE 152* 168* 107*   Electrolytes  Recent Labs Lab 05/26/16 0425  05/26/16 1727 05/27/16 0331 05/27/16 1701 05/28/16 0430  CALCIUM 8.2*  --   --  8.4*  --  8.7*  MG  --   < > 2.1 2.3 2.4  --   PHOS  --   < >  2.9 3.0 2.9  --   < > = values in this interval not displayed. Sepsis Markers  Recent Labs Lab 05/26/16 0852 05/27/16 0331 05/28/16 0430  PROCALCITON <0.10 <0.10 <0.10   ABG  Recent Labs Lab 05/24/16 1644 05/24/16 1950 05/27/16 1354  PHART 7.215* 7.317* 7.397  PCO2ART 52.1* 49.9* 50.8*  PO2ART 109.0* 481* 107.0   Liver Enzymes  Recent Labs Lab 05/24/16 0822 05/24/16 1537 05/27/16 0331  AST 18 115* 33  ALT 28 166* 84*  ALKPHOS 65 53 72  BILITOT 0.2 0.6 0.8  ALBUMIN 4.1 3.5 2.8*   Cardiac Enzymes  Recent Labs Lab 05/24/16 1900 05/25/16 0444 05/25/16 1222  TROPONINI 0.44* 0.48* 0.19*   Glucose  Recent Labs Lab 05/27/16 0816 05/27/16 1314 05/27/16 1639 05/27/16 2029 05/28/16 0109 05/28/16 0437  GLUCAP 166* 211* 161* 131* 114* 95    Imaging Dg Abd Portable 1v  Result Date: 05/27/2016 CLINICAL DATA:  Orogastric tube placement EXAM: PORTABLE ABDOMEN - 1 VIEW COMPARISON:   None. FINDINGS: Orogastric tube tip and side port in stomach. There is diffuse stool throughout the colon. No bowel dilatation or air-fluid level suggesting bowel obstruction. No free air. IMPRESSION: Orogastric tube tip and side port in stomach. Stool throughout colon. No bowel obstruction or free air evident. Electronically Signed   By: Bretta Bang III M.D.   On: 05/27/2016 11:23   Lines/Tubes: ETT 1/20 >> L IJ CVL 1/20 >>  Cultures: None  Antibiotics: Unasyn 1/21>>>1/23  Studies: CT Head 1/20 >> negative CXR 1/20 >> Endotracheal tube in satisfactory position. Worsening bilateral interstitial opacities which may reflect edema or infection. CXR 1/21 >> Tip of the left central line in the proximal SVC. No Pneumothorax. Developing hazy opacity at the right lung base, likely pleural fluid. Worsening interstitial opacities may be increasing pulmonary edema, pneumonia, or ARDS. EEG 1/20 >> moderate to severe generalized slowing.  No epileptiform discharges or seizures.  Improved some relative to burst suppression pattern seen yesterday.   ASSESSMENT / PLAN:  PULMONARY A: Intubated for airway protection Pulmonary edema Pulmonary nodule x3 right sided Patchy asp PNA likely P:   Continue SBT Await neuro improvement Continue Lasix 60 mg BID, responded well Aim for negative balance  On Fentanyl and propofol- for wua pcxr - edema improved after lasix  CARDIOVASCULAR A:  Cardiogenic shock on Levophed- resolved NSVT STEMI s/p DES to Cx HFrEF P:  Off pressors  Amiodarone gtt ASA 81 mg QD, atorvastatin 80 mg QD, Coreg 6.25 mg BID Ticagralor 90 mg BID  RENAL A:   AKI- resolved Pulmonary edema Pos balance P:   Replace electrolytes as needed bmet in am  kvo  GASTROINTESTINAL A:   Transaminitis - likely 2/2 ACS> resolved Nutrition GI PPx Abd distention/emesis- KUB with lrg stool burden, no SBO 1/23 P:   Famotidine for ppx NPO TFs held Need BM  HEMATOLOGIC A:    DVT Ppx P:  Heparin TID SQ   INFECTIOUS A:   Concern Asp PNA P:   Unasyn d/c'd 1/23 Repeat CXR- improved edema   ENDOCRINE A:   Hyperglycemia  - more controlled DM-2 P:   SSI TFs held  NEUROLOGIC A:   Sedated Anoxic Brain Injury New Onset Seizures: EEG- moderate to severe generalized slowing.  No epileptiform discharges or seizures.  Improved some relative to burst suppression pattern seen.  P:   RASS goal: 0 Fentanyl gtt Versed prn  Keppra 500 mg BID WUA  Rich Number, MD, MPH Internal Medicine Resident,  PGY-III Pager: [phone]   STAFF NOTE: Linwood Dibbles, MD FACP have personally reviewed patient's available data, including medical history, events of note, physical examination and test results as part of my evaluation. I have discussed with resident/NP and other care providers such as pharmacist, RN and RRT. In addition, I personally evaluated patient and elicited key findings of: opens eyes, not fc, not posturing, not tracking, lungs less coarse, does move legs to pain, pcxr improved, less edema, was neg 400 cc, no sbo likely mild ileus, add enema, weaning cpap 5 ps 5-10 if able, lasix reduction with some crt rise, MRI brain now, then assess prognosis with family, may start trickle feeds in am with reglan The patient is critically ill with multiple organ systems failure and requires high complexity decision making for assessment and support, frequent evaluation and titration of therapies, application of advanced monitoring technologies and extensive interpretation of multiple databases.   Critical Care Time devoted to patient care services described in this note is 30 Minutes. This time reflects time of care of this signee: Merrie Roof, MD FACP. This critical care time does not reflect procedure time, or teaching time or supervisory time of PA/NP/Med student/Med Resident etc but could involve care discussion time. Rest per NP/medical resident whose note is  outlined above and that I agree with   Lavon Paganini. Titus Mould, MD, [place] Pgr: [facility] 05/28/2016 11:46 AM

## 2016-05-29 ENCOUNTER — Inpatient Hospital Stay (HOSPITAL_COMMUNITY): Payer: BC Managed Care – PPO

## 2016-05-29 ENCOUNTER — Inpatient Hospital Stay (HOSPITAL_COMMUNITY): Admission: RE | Admit: 2016-05-29 | Payer: BC Managed Care – PPO | Source: Ambulatory Visit

## 2016-05-29 DIAGNOSIS — I639 Cerebral infarction, unspecified: Secondary | ICD-10-CM

## 2016-05-29 DIAGNOSIS — Z515 Encounter for palliative care: Secondary | ICD-10-CM

## 2016-05-29 LAB — CBC
HEMATOCRIT: 35.1 % — AB (ref 39.0–52.0)
HEMOGLOBIN: 10.8 g/dL — AB (ref 13.0–17.0)
MCH: 27.8 pg (ref 26.0–34.0)
MCHC: 30.8 g/dL (ref 30.0–36.0)
MCV: 90.2 fL (ref 78.0–100.0)
Platelets: 168 10*3/uL (ref 150–400)
RBC: 3.89 MIL/uL — ABNORMAL LOW (ref 4.22–5.81)
RDW: 13.5 % (ref 11.5–15.5)
WBC: 11.5 10*3/uL — ABNORMAL HIGH (ref 4.0–10.5)

## 2016-05-29 LAB — BASIC METABOLIC PANEL
Anion gap: 10 (ref 5–15)
Anion gap: 13 (ref 5–15)
BUN: 34 mg/dL — AB (ref 6–20)
BUN: 36 mg/dL — AB (ref 6–20)
CALCIUM: 8.8 mg/dL — AB (ref 8.9–10.3)
CHLORIDE: 109 mmol/L (ref 101–111)
CO2: 30 mmol/L (ref 22–32)
CO2: 29 mmol/L (ref 22–32)
CREATININE: 1.16 mg/dL (ref 0.61–1.24)
Calcium: 8.2 mg/dL — ABNORMAL LOW (ref 8.9–10.3)
Chloride: 113 mmol/L — ABNORMAL HIGH (ref 101–111)
Creatinine, Ser: 1.05 mg/dL (ref 0.61–1.24)
GFR calc Af Amer: >60 mL/min (ref >60)
GFR calc Af Amer: >60 mL/min (ref >60)
GFR calc non Af Amer: >60 mL/min (ref >60)
GLUCOSE: 80 mg/dL (ref 65–99)
Glucose, Bld: 151 mg/dL — ABNORMAL HIGH (ref 65–99)
Potassium: 3.5 mmol/L (ref 3.5–5.1)
Potassium: 3.4 mmol/L — ABNORMAL LOW (ref 3.5–5.1)
SODIUM: 151 mmol/L — AB (ref 135–145)
Sodium: 153 mmol/L — ABNORMAL HIGH (ref 135–145)

## 2016-05-29 LAB — GLUCOSE, CAPILLARY
GLUCOSE-CAPILLARY: 91 mg/dL (ref 65–99)
GLUCOSE-CAPILLARY: 81 mg/dL (ref 65–99)
Glucose-Capillary: 121 mg/dL — ABNORMAL HIGH (ref 65–99)
Glucose-Capillary: 121 mg/dL — ABNORMAL HIGH (ref 65–99)
Glucose-Capillary: 149 mg/dL — ABNORMAL HIGH (ref 65–99)
Glucose-Capillary: 163 mg/dL — ABNORMAL HIGH (ref 65–99)
Glucose-Capillary: 196 mg/dL — ABNORMAL HIGH (ref 65–99)
Glucose-Capillary: 246 mg/dL — ABNORMAL HIGH (ref 65–99)

## 2016-05-29 MED ORDER — PRO-STAT SUGAR FREE PO LIQD
60.0000 mL | Freq: Four times a day (QID) | ORAL | Status: DC
  Administered 2016-05-29 – 2016-06-01 (×11): 60 mL
  Filled 2016-05-29 (×10): qty 60

## 2016-05-29 MED ORDER — BISACODYL 10 MG RE SUPP
10.0000 mg | Freq: Every day | RECTAL | Status: DC | PRN
  Administered 2016-05-29: 10 mg via RECTAL
  Filled 2016-05-29: qty 1

## 2016-05-29 MED ORDER — VITAL HIGH PROTEIN PO LIQD
1000.0000 mL | ORAL | Status: DC
  Filled 2016-05-29: qty 1000

## 2016-05-29 MED ORDER — DEXTROSE 5 % IV SOLN
INTRAVENOUS | Status: DC
  Administered 2016-05-29 – 2016-05-31 (×3): via INTRAVENOUS

## 2016-05-29 MED ORDER — CARVEDILOL 12.5 MG PO TABS
12.5000 mg | ORAL_TABLET | Freq: Two times a day (BID) | ORAL | Status: DC
  Administered 2016-05-29 – 2016-06-02 (×7): 12.5 mg
  Filled 2016-05-29 (×7): qty 1

## 2016-05-29 MED ORDER — VITAL HIGH PROTEIN PO LIQD
1000.0000 mL | ORAL | Status: DC
  Administered 2016-05-29 – 2016-05-31 (×3): 1000 mL
  Filled 2016-05-29 (×4): qty 1000

## 2016-05-29 MED ORDER — LABETALOL HCL 5 MG/ML IV SOLN
10.0000 mg | Freq: Four times a day (QID) | INTRAVENOUS | Status: DC | PRN
  Administered 2016-05-29 – 2016-06-05 (×2): 10 mg via INTRAVENOUS
  Filled 2016-05-29 (×2): qty 4

## 2016-05-29 NOTE — Progress Notes (Signed)
                                                                                                                                                                                                         Daily Progress Note   Patient Name: Douglas Carr       Date: 05/29/2016 DOB: 02/11/1966  Age: 50 y.o. MRN#: [id number] Attending Physician: David W Harding, MD Primary Care Physician: MILLER, STEPHEN M, MD Admit Date: 05/24/2016  Reason for Consultation/Follow-up: Establishing goals of care and Psychosocial/spiritual support  Subjective: Patient intubated.  Not speaking.  We examined the patient this am and spoke with the CCM team.  Then met with wife ([name]) and sons (Zach and Corey) at 2:00 pm.  The purpose of the meeting was for the sons to express their feelings about being in a "grey area" with unknown outcome.   We also discussed weaning from the vent and the potential need to "re-decide" about trach/peg next Tues/Wed/Thurs.  Hopefully their father will wean on his own but if not the family would appreciate as much information as possible about recovery before committing Douglas Carr to a temporary or permanent trach/peg.    We discussed his cardiac and PVD history and his current heart failure.  We discussed the MRI results.  I relayed that the Neurologist offered to meet with the family personally. We discussed being aware of the holistic picture of Douglas Carr's heath.  The family reiterated that if Douglas Carr is not able to be up and about and communicate with his family they would prefer to let him pass peacefully.  Patient Profile/HPI: 50 y.o. male  with past medical history of CAD, carotid stenosis (s/p CEA) CHF (25 - 30%), DM, recent MI 05/07/2016 who was admitted on 05/24/2016 with cardiac arrest. He was intubated.  A code STEMI was called and the patient was taken to the cath lab and stented.  The stent he received in early January had clogged and thrombosed. He completed hypothermic protocol.   Despite being comatose, Douglas Carr suffered a seizure.  Neurology is on board and Douglas Carr has been started on Keppra.  His most recent MRI showed several abnormalities but no "global anoxia" and no definite cause for his current neurological state.  Today (1/25) the patient remains intubated with primarily only reflexive responses.  Trickle feeds were started.  He is not on pressors.  The family feels he is beginning to show signs of interacting with them (holding onto his sons hand).  The family is cautiously optimistic.   Length of   Stay: 5  Current Medications: Scheduled Meds:  . aspirin  81 mg Per Tube Daily  . atorvastatin  80 mg Per Tube q1800  . carvedilol  12.5 mg Per Tube BID WC  . chlorhexidine gluconate (MEDLINE KIT)  15 mL Mouth Rinse BID  . docusate  100 mg Per Tube BID  . famotidine  20 mg Oral Daily  . feeding supplement (PRO-STAT SUGAR FREE 64)  60 mL Per Tube BID  . feeding supplement (VITAL HIGH PROTEIN)  1,000 mL Per Tube Q24H  . heparin  5,000 Units Subcutaneous Q8H  . insulin aspart  0-15 Units Subcutaneous Q4H  . levETIRAcetam  1,000 mg Intravenous Q12H  . mouth rinse  15 mL Mouth Rinse 10 times per day  . multivitamin  15 mL Per Tube Daily  . sennosides  5 mL Per Tube BID  . sodium chloride flush  10-40 mL Intracatheter Q12H  . sodium chloride flush  3 mL Intravenous Q12H  . ticagrelor  90 mg Oral BID    Continuous Infusions: . amiodarone 30 mg/hr (05/29/16 1106)  . dextrose 40 mL/hr at 05/29/16 1038  . fentaNYL infusion INTRAVENOUS Stopped (05/29/16 1000)  . propofol (DIPRIVAN) infusion 8 mcg/kg/min (05/29/16 1105)    PRN Meds: sodium chloride, Place/Maintain arterial line **AND** sodium chloride, acetaminophen, bisacodyl, fentaNYL, fentaNYL (SUBLIMAZE) injection, labetalol, midazolam, ondansetron (ZOFRAN) IV, sodium chloride flush, sodium chloride flush  Physical Exam        Wd wn male, intubated.  He opens eyes to sound Pupils equal and  reactive.  No tracking.  Appeared to be able to focus somewhat. CV rrr.  Distant heart sounds Resp intubated.  Lungs coarse Abdomen  Nt, mildly distended, decreased bowel sounds.  Vital Signs: BP 126/68   Pulse 89   Temp 97.9 F (36.6 C) (Oral)   Resp 17   Ht 5' 9" (1.753 m)   Wt 103.3 kg (227 lb 11.8 oz)   SpO2 100%   BMI 33.63 kg/m  SpO2: SpO2: 100 % O2 Device: O2 Device: Ventilator O2 Flow Rate:    Intake/output summary:   Intake/Output Summary (Last 24 hours) at 05/29/16 1207 Last data filed at 05/29/16 1100  Gross per 24 hour  Intake          1025.97 ml  Output             2490 ml  Net         -1464.03 ml   LBM: Last BM Date:  (pta) Baseline Weight: Weight: 106.1 kg (234 lb) Most recent weight: Weight: 103.3 kg (227 lb 11.8 oz)       Palliative Assessment/Data:    Flowsheet Rows   Flowsheet Row Most Recent Value  Intake Tab  Referral Department  Cardiology  Unit at Time of Referral  ICU  Palliative Care Primary Diagnosis  Cardiac  Date Notified  05/25/16  Palliative Care Type  Return patient Palliative Care  Reason for referral  Clarify Goals of Care  Date of Admission  05/24/16  Date first seen by Palliative Care  05/26/16  # of days Palliative referral response time  1 Day(s)  # of days IP prior to Palliative referral  1  Clinical Assessment  Palliative Performance Scale Score  10%  Psychosocial & Spiritual Assessment  Palliative Care Outcomes  Patient/Family meeting held?  Yes  Who was at the meeting?  wife  Palliative Care Outcomes  Clarified goals of care      Patient Active Problem   List   Diagnosis Date Noted  . Encounter for hospice care discussion   . Anoxic brain injury ([place])   . Anoxic encephalopathy ([place])   . Seizures ([place])   . Goals of care, counseling/discussion   . Palliative care encounter   . Central venous catheter in place   . Cardiac arrest ([place]) 05/25/2016  . Acute encephalopathy   . Cardiac arrest with ventricular  fibrillation ([place]) 05/24/2016  . Dilated cardiomyopathy ([place]) 05/24/2016  . Coronary artery disease involving native coronary artery of native heart with angina pectoris ([place]) 05/24/2016  . ST elevation myocardial infarction (STEMI) ([place])   . 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 Status post coronary artery stent placement   . Acute on chronic systolic heart failure ([place])   . Acute ST elevation myocardial infarction (STEMI) involving left circumflex coronary artery ([place]) 05/07/2016  . Essential hypertension 09/21/2013  . Diabetes mellitus ([place]) 09/21/2013  . Combined hyperlipidemia associated with type 2 diabetes mellitus 09/21/2013  . Pulmonary nodules 10/16/2011    Palliative Care Plan    Recommendations/Plan:  PMT will continue to provide psycho social support to the family and be available to the medical team.  If he is not able to speak and be function they want to let him pass  The family needs clear direction from the medical team rather than being offered choices.  Goals of Care and Additional Recommendations:  Limitations on Scope of Treatment: Full Scope Treatment  Code Status:  Full code  Prognosis:   Unable to determine   Discharge Planning:  To Be Determined  Care plan was discussed with CCM team, wife, sons  Thank you for allowing the Palliative Medicine Team to assist in the care of this patient.  Total time spent:  55  min.     Greater than 50%  of this time was spent counseling and coordinating care related to the above assessment and plan.  Imogene Burn, PA-C Palliative Medicine  Please contact Palliative MedicineTeam phone at [phone] for questions and concerns between 7 am - 7 pm.   Please see AMION for individual provider pager numbers.

## 2016-05-29 NOTE — Progress Notes (Signed)
Nutrition Follow-up  DOCUMENTATION CODES:   Obesity unspecified  INTERVENTION:   Restart TF: Vital High Protein @ 20 ml/hr (480 ml/day) 60 ml Prostat QID Provides: 1280 kcal, 162 grams protein, and 401 ml H2O. TF regimen and propofol at current rate providing 1533 total kcal/day   NUTRITION DIAGNOSIS:   Inadequate oral intake related to inability to eat as evidenced by NPO status. Ongoing.   GOAL:   Provide needs based on ASPEN/SCCM guidelines Met.   MONITOR:   TF tolerance, I & O's, Vent status  ASSESSMENT:   51 yo male with PMH of CAD(recently admitted from 1/3-1/7for MI, had PCI of Om2), HFrEF (EF 35%), DMII (on insulin), HLD and carotid disease. Pt admitted with OOH arrest, now with onset seizures.   Spoke with RN, propofol added TF held 1/23 d/t emesis, no bm noted. Xray on 1/23 noted stool throughout colon.   Per RN multiple medications given to assist with BM. MD wants to resume TF today.  Propofol: 9.6 ml/hr provides: 253 kcal per day from lipid   Diet Order:    NPO  Skin:  Reviewed, no issues  Last BM:  unknown  Height:   Ht Readings from Last 1 Encounters:  05/24/16 5' 9" (1.753 m)    Weight:   Wt Readings from Last 1 Encounters:  05/29/16 227 lb 11.8 oz (103.3 kg)    Ideal Body Weight:  72.7 kg  BMI:  Body mass index is 33.63 kg/m.  Estimated Nutritional Needs:   Kcal:  1223-1556  Protein:  >145 grams  Fluid:  > 1.5 L/day   EDUCATION NEEDS:   No education needs identified at this time    RD, LDN, CNSC [phone] Pager [phone] After Hours Pager  

## 2016-05-29 NOTE — Progress Notes (Signed)
Neurology Progress Note  Subjective: No significant overnight events. There has not been any major change in his neurologic status. He remains intubated and is sedated with fentanyl. He is not able to participate with the exam due to his encephalopathy.   Pertinent meds:  Fentanyl drip 100 mcg/hr Propofol drip OFF 05/26/16 at 0915 Amiodarone drip 30 mg/hr Norepinephrine drip OFF 05/26/16 1000 Keppra 1000 mg q12h  Current Meds:   Current Facility-Administered Medications:  .  0.9 %  sodium chloride infusion, 250 mL, Intravenous, PRN, [name], MD, Last Rate: 10 mL/hr at 05/28/16 2000, 250 mL at 05/28/16 2000 .  Place/Maintain arterial line, , , Until Discontinued **AND** 0.9 %  sodium chloride infusion, , Intra-arterial, PRN, [name], MD .  acetaminophen (TYLENOL) tablet 650 mg, 650 mg, Oral, Q4H PRN, [name], MD .  amiodarone (NEXTERONE PREMIX) 360-4.14 MG/200ML-% (1.8 mg/mL) IV infusion, 30 mg/hr, Intravenous, Continuous, [name], MD, Last Rate: 16.7 mL/hr at 05/29/16 0152, 30 mg/hr at 05/29/16 0152 .  aspirin chewable tablet 81 mg, 81 mg, Per Tube, Daily, [name], MD, 81 mg at 05/28/16 0901 .  atorvastatin (LIPITOR) tablet 80 mg, 80 mg, Per Tube, q1800, [name], MD, 80 mg at 05/28/16 1638 .  carvedilol (COREG) tablet 6.25 mg, 6.25 mg, Per Tube, BID WC, [name], MD, 6.25 mg at 05/28/16 1636 .  chlorhexidine gluconate (MEDLINE KIT) (PERIDEX) 0.12 % solution 15 mL, 15 mL, Mouth Rinse, BID, [name], MD, 15 mL at 05/28/16 2000 .  docusate (COLACE) 50 MG/5ML liquid 100 mg, 100 mg, Per Tube, BID, [name], MD, 100 mg at 05/28/16 2127 .  famotidine (PEPCID) 40 MG/5ML suspension 20 mg, 20 mg, Oral, Daily, [name], MD, 20 mg at 05/28/16 0904 .  feeding supplement (PRO-STAT SUGAR FREE 64) liquid 60 mL, 60 mL, Per Tube, BID, [name], MD, 60 mL at 05/28/16 2127 .  feeding supplement (VITAL HIGH PROTEIN) liquid 1,000  mL, 1,000 mL, Per Tube, Q24H, [name], MD, Stopped at 05/27/16 0315 .  fentaNYL (SUBLIMAZE) 2,500 mcg in sodium chloride 0.9 % 250 mL (10 mcg/mL) infusion, 25-400 mcg/hr, Intravenous, Continuous, [name], MD, Last Rate: 10 mL/hr at 05/29/16 0600, 100 mcg/hr at 05/29/16 0600 .  fentaNYL (SUBLIMAZE) bolus via infusion 50 mcg, 50 mcg, Intravenous, Q1H PRN, [name], MD .  fentaNYL (SUBLIMAZE) injection 100 mcg, 100 mcg, Intravenous, Q15 min PRN, [name], MD .  furosemide (LASIX) injection 60 mg, 60 mg, Intravenous, Daily, [name], MD .  heparin injection 5,000 Units, 5,000 Units, Subcutaneous, Q8H, [name], MD, 5,000 Units at 05/29/16 [phone] .  insulin aspart (novoLOG) injection 0-15 Units, 0-15 Units, Subcutaneous, Q4H, [name], MD, 2 Units at 05/29/16 0047 .  levETIRAcetam (KEPPRA) 1,000 mg in sodium chloride 0.9 % 100 mL IVPB, 1,000 mg, Intravenous, Q12H, Darrel Reach, MD, 1,000 mg at 05/28/16 2127 .  MEDLINE mouth rinse, 15 mL, Mouth Rinse, 10 times per day, [name], MD, 15 mL at 05/29/16 3295 .  midazolam (VERSED) injection 2 mg, 2 mg, Intravenous, Q2H PRN, [name], MD, 2 mg at 05/29/16 0416 .  multivitamin liquid 15 mL, 15 mL, Per Tube, Daily, [name], MD, 15 mL at 05/28/16 0900 .  norepinephrine (LEVOPHED) 4 mg in dextrose 5 % 250 mL (0.016 mg/mL) infusion, 0-40 mcg/min, Intravenous, Titrated, [name], MD, Stopped at 05/26/16 1000 .  ondansetron [employer]) injection 4 mg, 4 mg, Intravenous, Q6H PRN, [name], MD, 4 mg at 05/27/16 0345 .  propofol (DIPRIVAN) 1000 MG/100ML infusion, 5-40 mcg/kg/min, Intravenous, Titrated, [name], MD, Stopped at 05/26/16 0915 .  sennosides (SENOKOT) 8.8 MG/5ML syrup 5 mL, 5 mL, Per Tube, BID, [name], MD, 5 mL at 05/28/16 2127 .  sodium chloride flush (NS) 0.9 % injection 10-40 mL, 10-40 mL, Intracatheter, Q12H, Jose Angelo A de Alton, MD, 10 mL at 05/28/16  2222 .  sodium chloride flush (NS) 0.9 % injection 10-40 mL, 10-40 mL, Intracatheter, PRN, Jose Angelo A Corrie Dandy, MD, 10 mL at 05/28/16 2220 .  sodium chloride flush (NS) 0.9 % injection 3 mL, 3 mL, Intravenous, Q12H, [name], MD, 3 mL at 05/28/16 2200 .  sodium chloride flush (NS) 0.9 % injection 3 mL, 3 mL, Intravenous, PRN, [name], MD .  ticagrelor [facility]) tablet 90 mg, 90 mg, Oral, BID, [name], MD, 90 mg at 05/28/16 2127  Objective:  Temp:  [98.5 F (36.9 C)-99.9 F (37.7 C)] 98.6 F (37 C) (01/25 0409) Pulse Rate:  [75-99] 87 (01/25 0700) Resp:  [10-20] 10 (01/25 0700) BP: (115-171)/(56-86) 145/86 (01/25 0700) SpO2:  [97 %-100 %] 100 % (01/25 0700) FiO2 (%):  [40 %] 40 % (01/25 0409) Weight:  [103.3 kg (227 lb 11.8 oz)] 103.3 kg (227 lb 11.8 oz) (01/25 0500)  General: WDWN Caucasian man lying in ICU bed. He is currently on fentanyl 100 mcg/hr. His eyes open to verbal and tactile stimulation. He does not fix or track. He does not follow any commands.  HEENT: Neck is supple without lymphadenopathy. ETT and OGT in place. Sclerae are anicteric. There is mild conjunctival injection. L IJ TLC in place.  CV: Regular, no murmur. Carotid pulses are 2+ and symmetric with no bruits. Distal pulses 2+ and symmetric.  Lungs: CTAB on anterior exam. Ventilated. Extremities: No C/C/E.  Neuro: MS: As noted above.  CN: Pupils are equal and reactive from 3-->2 mm bilaterally. He does not blink to visual threat. His eyes are conjugate. Oculocephalics are sluggish. He has brisk corneals bilaterally. His face is grossly symmetric but is partly obscured by tubes and tape. No gag with ETT manipulation. The remainder of his cranial nerves cannot be accurately assessed as he is unable to participate with the exam.  Motor: Normal bulk. Tone is reduced throughout. No spontaneous movement. No tremor or other abnormal movements are observed.  Sensation: He has triple flexion to nailbed  pressure and Babinski in BLE. No response to supraorbital pressure, UE nailbed pressure, or proximal pain.  DTRs: 3+, symmteric, upgoing toes bilaterally. Coordination and gait: Unable to assess as the patient is unable to participate with the exam.   Labs: Lab Results  Component Value Date   WBC 13.2 (H) 05/28/2016   HGB 11.2 (L) 05/28/2016   HCT 36.2 (L) 05/28/2016   PLT 190 05/28/2016   GLUCOSE 107 (H) 05/28/2016   CHOL 109 05/24/2016   TRIG 109 05/27/2016   HDL 23 (L) 05/24/2016   LDLCALC 58 05/24/2016   ALT 84 (H) 05/27/2016   AST 33 05/27/2016   NA 146 (H) 05/28/2016   K 4.1 05/28/2016   CL 105 05/28/2016   CREATININE 1.20 05/28/2016   BUN 42 (H) 05/28/2016   CO2 30 05/28/2016   TSH 3.503 05/07/2016   INR 1.13 05/25/2016   HGBA1C 9.3 (H) 05/07/2016   MICROALBUR neg 10/20/2014  CBC Latest Ref Rng & Units 05/28/2016 05/27/2016 05/26/2016  WBC 4.0 - 10.5 K/uL 13.2(H) 15.3(H) 15.9(H)  Hemoglobin 13.0 - 17.0 g/dL 11.2(L) 12.1(L) 12.1(L)  Hematocrit 39.0 - 52.0 % 36.2(L) 37.1(L) 37.4(L)  Platelets 150 - 400 K/uL 190 160 227    Lab Results  Component Value Date   HGBA1C 9.3 (H) 05/07/2016   Lab Results  Component Value Date   ALT 84 (H) 05/27/2016   AST 33 05/27/2016   ALKPHOS 72 05/27/2016   BILITOT 0.8 05/27/2016    Radiology:  I have personally and independently reviewed the MRI brain without contrast from 05/28/16.  There is a punctate area of restricted diffusion involving the inferior aspect of the left thalamus that is suggestive of acute ischemia. In addition, there appears to be diffusion abnormality with corresponding FLAIR hyperintensity involving the cerebellum diffusely, consistent with hypoxic-ischemic injury. Scattered areas of T2/FLAIR hyperintensity are noted in the bihemispheric white matter consistent with chronic small vessel ischemic changes.    A/P:   1. Anoxic brain injury: This is due to OOH cardiac arrest, presumed 2/2 ventricular arrhythmia  in setting of STEMI. Per notes, family estimated his downtime could have been up to ten minutes. Treatment at this time is supportive. He has completed the mild hypothermia protocol and has been normothermic since 1/21. Today is day #5 post-arrest this afternoon and he has been normothermic for over 4 days.  2. 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 The current examination shows intact brainstem reflexes and triple flexion to pain in BLE; this is unchanged from yesterday. He has eye opening with verbal and tactile stimulation but still no evidence of higher cortical function. He seems to have transitioned out of coma into more of a vegetative state. MRI shows a punctate infarct in the L thalamus that is likely of little clinical significance at this time. There also appears to be evidence of hypoxic-ischemic injury to the cerebellum, which is not uncommon in the setting of anoxia. This is not likely to keep him from waking up. Continue with supportive care. Avoid hypoxemia and hypotension for even brief intervals as these are associated with worse neurologic outcomes. Fever and hyperglycemia must be aggressively treated for the same reason. We will continue to follow the exam to aid with neurologic prognosis, which is guarded at this time. However, his youth is in his favor and given that he is no longer comatose I would favor ongoing support to allow adequate time for recovery if this is possible. I have personally seen young patients with similar exams and courses do quite well.   3. Possible myoclonus: He was reported to have clinical seizure activity but this did not have an electrical correlate on EEG, essentially excluding epileptic seizure. Myoclonus is possible, however. I will continue Keppra to 1000 mg q12 for now.  4. Acute ischemic stroke: MRI shows a tiny infarct in the inferior L thalamus. This is likely of little clinical significance at this time.  Continue aspirin and statin. If he is able to recover can complete stroke workup at that time with vascular imaging.   No family present at the time of my visit.   This patient is critically ill and at significant risk of neurological worsening, death and care requires constant monitoring of vital signs, hemodynamics,respiratory and cardiac monitoring, neurological assessment, discussion with family, other specialists and medical decision making of high complexity. A total of 30 minutes of critical care time was  spent on this case.   Melba Coon, MD [facility]

## 2016-05-29 NOTE — Progress Notes (Signed)
PULMONARY / CRITICAL CARE MEDICINE   ADMISSION DATE:  05/24/2016 CONSULTATION DATE:  05/24/2016  REFERRING MD :  Dr. Herbie Baltimore  CHIEF COMPLAINT:  STEMI/Post arrest  HISTORY OF PRESENT ILLNESS:   51 yo male with PMH of CAD(recently admitted from 1/3-1/39for MI, had PCI of Om2), HFrEF (EF 35%), DMII (on insulin), HLD and carotid disease.  Had intermittent fluid retention at home with one additional admission for this but today was in his usual state of health up until this morning. He was found sitting in a chair shaking by his family. Noted pulseless at 1441. CPR was started by family member, placed on AED and given 1 shock. Pulses returned. No medications given. EKG showed ST with LVH and repol abnormality. Code STEMI was activated. He was intubated in the ED.   He was taken to the cath lab and DES placed in Circumflex.  SUBJECTIVE:  MRI done, d/w neuro and family, maintain support and re assess Monday Some mucous plugs per rt this am  No BM  VITAL SIGNS: Temp:  [98.5 F (36.9 C)-99.9 F (37.7 C)] 98.7 F (37.1 C) (01/25 0811) Pulse Rate:  [75-99] 93 (01/25 0843) Resp:  [10-20] 18 (01/25 0843) BP: (115-171)/(56-86) 157/84 (01/25 0843) SpO2:  [99 %-100 %] 100 % (01/25 0843) FiO2 (%):  [40 %] 40 % (01/25 0843) Weight:  [103.3 kg (227 lb 11.8 oz)] 103.3 kg (227 lb 11.8 oz) (01/25 0500) HEMODYNAMICS:   VENTILATOR SETTINGS: Vent Mode: SIMV;Volume support;PSV FiO2 (%):  [40 %] 40 % Set Rate:  [10 bmp] 10 bmp Vt Set:  [580 mL] 580 mL PEEP:  [5 cmH20] 5 cmH20 Pressure Support:  [8 cmH20] 8 cmH20 Plateau Pressure:  [12 cmH20-14 cmH20] 14 cmH20 INTAKE / OUTPUT:  Intake/Output Summary (Last 24 hours) at 05/29/16 0853 Last data filed at 05/29/16 0800  Gross per 24 hour  Intake          1225.67 ml  Output             2195 ml  Net          -969.33 ml    PHYSICAL EXAMINATION: General:  Opens eyes to pain HEENT:  ETT in place,  Line left IJ clean Cardiovascular:  RRR, no  m/r/g Lungs: CTA  Abdomen: Hypoactive BS about the same, soft, distended Musculoskeletal:  Normal bulk and tone Skin:  Cool, no rashes  Neuro: perr 4, moves lowers to pain, rass -1, opens eyes to pain   LABS:  CBC  Recent Labs Lab 05/26/16 0425 05/27/16 0331 05/28/16 0430  WBC 15.9* 15.3* 13.2*  HGB 12.1* 12.1* 11.2*  HCT 37.4* 37.1* 36.2*  PLT 227 160 190   Coag's  Recent Labs Lab 05/24/16 1537 05/25/16 0444  APTT 26 31  INR 1.07 1.13   BMET  Recent Labs Lab 05/26/16 0425 05/27/16 0331 05/28/16 0430  NA 143 140 146*  K 3.7 4.0 4.1  CL 108 104 105  CO2 27 27 30   BUN 16 28* 42*  CREATININE 0.94 0.92 1.20  GLUCOSE 152* 168* 107*   Electrolytes  Recent Labs Lab 05/26/16 0425  05/26/16 1727 05/27/16 0331 05/27/16 1701 05/28/16 0430  CALCIUM 8.2*  --   --  8.4*  --  8.7*  MG  --   < > 2.1 2.3 2.4  --   PHOS  --   < > 2.9 3.0 2.9  --   < > = values in this interval not displayed. Sepsis Markers  Recent Labs Lab 05/26/16 0852 05/27/16 0331 05/28/16 0430  PROCALCITON <0.10 <0.10 <0.10   ABG  Recent Labs Lab 05/24/16 1644 05/24/16 1950 05/27/16 1354  PHART 7.215* 7.317* 7.397  PCO2ART 52.1* 49.9* 50.8*  PO2ART 109.0* 481* 107.0   Liver Enzymes  Recent Labs Lab 05/24/16 0822 05/24/16 1537 05/27/16 0331  AST 18 115* 33  ALT 28 166* 84*  ALKPHOS 65 53 72  BILITOT 0.2 0.6 0.8  ALBUMIN 4.1 3.5 2.8*   Cardiac Enzymes  Recent Labs Lab 05/24/16 1900 05/25/16 0444 05/25/16 1222  TROPONINI 0.44* 0.48* 0.19*   Glucose  Recent Labs Lab 05/28/16 1317 05/28/16 1609 05/28/16 2046 05/29/16 0035 05/29/16 0440 05/29/16 0813  GLUCAP 127* 131* 76 121* 91 81    Imaging Mr Brain Wo Contrast  Result Date: 05/28/2016 CLINICAL DATA:  Cardiac arrest with CPR.  Anoxia. EXAM: MRI HEAD WITHOUT CONTRAST TECHNIQUE: Multiplanar, multiecho pulse sequences of the brain and surrounding structures were obtained without intravenous contrast.  COMPARISON:  CT 05/24/2016 FINDINGS: Brain: Ventricle size normal.  Cerebral volume normal. 4 mm area of diffusion hyperintensity in the left lower thalamus. This is only seen on axial diffusion and not coronal diffusion. Cannot confirm this is restricted diffusion on ADC map. Possible acute infarct. No other areas of restricted diffusion. Chronic ischemia over the convexity bilaterally right greater than left. Negative for hemorrhage or mass. Vascular: Normal arterial flow voids. Skull and upper cervical spine: Negative Sinuses/Orbits: Negative Other: None IMPRESSION: Possible 4 mm acute infarct left thalamus.  No other acute infarct. Mild chronic ischemia of the convexity bilaterally right greater than left. Electronically Signed   By: Marlan Palau M.D.   On: 05/28/2016 12:39   Lines/Tubes: ETT 1/20 >> L IJ CVL 1/20 >>  Cultures: None  Antibiotics: Unasyn 1/21>>>1/23  Studies: CT Head 1/20 >> negative CXR 1/20 >> Endotracheal tube in satisfactory position. Worsening bilateral interstitial opacities which may reflect edema or infection. CXR 1/21 >> Tip of the left central line in the proximal SVC. No Pneumothorax. Developing hazy opacity at the right lung base, likely pleural fluid. Worsening interstitial opacities may be increasing pulmonary edema, pneumonia, or ARDS. EEG 1/20 >> moderate to severe generalized slowing.  No epileptiform discharges or seizures.  Improved some relative to burst suppression pattern seen yesterday.   ASSESSMENT / PLAN:  PULMONARY A: Intubated for airway protection Pulmonary edema Pulmonary nodule x3 right sided Unclear asp  P:   Clear lungs , suciton easily some secretions, no fevers -remains off abx pcxr in am  Weaning this am PS 10 required Janina Mayo is NOT an option per family  CARDIOVASCULAR A:  Cardiogenic shock on Levophed- resolved NSVT STEMI s/p DES to Cx HFrEF HTn remains P:  Amiodarone gtt ASA 81 mg QD, atorvastatin 80 mg QD Coreg 6.25  mg BID- increase  Ticagralor 90 mg BID  RENAL A:   AKI- resolved Pulmonary edema Successful neg balance on lasix P:   Replace electrolytes as needed bmet in am and now kvo  GASTROINTESTINAL A:   Transaminitis - likely 2/2 ACS> resolved Nutrition GI PPx Abd distention/emesis- KUB with lrg stool burden, no SBO 1/23 P:   Famotidine for ppx NPO TFs held Need BM- add dulc supp, may need enema  HEMATOLOGIC A:   DVT Ppx P:  Heparin TID SQ   INFECTIOUS A:   Concern Asp PNA P:   If spike, add ceftaz  ENDOCRINE A:   Hyperglycemia  - more controlled DM-2 P:  SSI TFs held  NEUROLOGIC A:   Sedated Anoxic Brain Injury- unlcear to what degree New Onset Seizures: EEG- moderate to severe generalized slowing.  No epileptiform discharges or seizures.  Improved some relative to burst suppression pattern seen.  P:   RASS goal: 0 Fentanyl gtt- reduce to goal off Versed prn  Keppra 500 mg BID WUA MRI, CT , eeg and neuro involved - re assess Monday, if no progress then comfort care  Ccm time 30 min   Mcarthur Rossettianiel J. Tyson AliasFeinstein, MD, FACP Pgr: [phone] [facility] 05/29/2016 8:53 AM

## 2016-05-29 NOTE — Progress Notes (Signed)
EEG completed, results pending. 

## 2016-05-29 NOTE — Progress Notes (Signed)
1037 Douglas Carr was agitated, biting down on tube. Restarted sedation at this time with propofol. While in the room noted rhythmic twitches in the right hand lasting for 10 sec and then stopped. After a min the twitches started again and also noted in the right toes lasting 10 sec. Douglas Carr has continued to intermittently do this lasting roughly 10 sec or less each time. Neuro MD paged and orders to get EEG placed and hold off on prn push of medication at this time to attempt visual of the activity on EEG to determine true seizure. Will continue the propofol gtt at this time to keep agitation at a minimum. Will continue to monitor

## 2016-05-29 NOTE — Procedures (Signed)
Electroencephalogram (EEG) Report  Date of study: 05/29/16  Requesting clinician: Melba Coon M.D.  Reason for study: Evaluate for seizure  Brief clinical history: This is a 51 year old man with anoxic brain injury following cardiac arrest. Today nursing noted some rhythmic movements of his right hand and foot. EEG is not being performed to exclude the possibility of seizure.  Medications:  Current Facility-Administered Medications:  .  0.9 %  sodium chloride infusion, 250 mL, Intravenous, PRN, [name], MD, Last Rate: 10 mL/hr at 05/28/16 2000, 250 mL at 05/28/16 2000 .  Place/Maintain arterial line, , , Until Discontinued **AND** 0.9 %  sodium chloride infusion, , Intra-arterial, PRN, [name], MD .  acetaminophen (TYLENOL) tablet 650 mg, 650 mg, Oral, Q4H PRN, [name], MD .  amiodarone (NEXTERONE PREMIX) 360-4.14 MG/200ML-% (1.8 mg/mL) IV infusion, 30 mg/hr, Intravenous, Continuous, [name], MD, Last Rate: 16.7 mL/hr at 05/29/16 1106, 30 mg/hr at 05/29/16 1106 .  aspirin chewable tablet 81 mg, 81 mg, Per Tube, Daily, [name], MD, 81 mg at 05/29/16 4481 .  atorvastatin (LIPITOR) tablet 80 mg, 80 mg, Per Tube, q1800, [name], MD, 80 mg at 05/28/16 1638 .  bisacodyl (DULCOLAX) suppository 10 mg, 10 mg, Rectal, Daily PRN, Juliet Rude, MD, 10 mg at 05/29/16 8563 .  carvedilol (COREG) tablet 12.5 mg, 12.5 mg, Per Tube, BID WC, Carly J Rivet, MD .  chlorhexidine gluconate (MEDLINE KIT) (PERIDEX) 0.12 % solution 15 mL, 15 mL, Mouth Rinse, BID, [name], MD, 15 mL at 05/29/16 0804 .  dextrose 5 % solution, , Intravenous, Continuous, [name], MD, Last Rate: 40 mL/hr at 05/29/16 1038 .  docusate (COLACE) 50 MG/5ML liquid 100 mg, 100 mg, Per Tube, BID, [name], MD, 100 mg at 05/29/16 1497 .  famotidine (PEPCID) 40 MG/5ML suspension 20 mg, 20 mg, Oral, Daily, [name], MD, 20 mg at 05/29/16 0263 .  feeding supplement  (PRO-STAT SUGAR FREE 64) liquid 60 mL, 60 mL, Per Tube, QID, [name], MD .  Derrill Memo ON 05/30/2016] feeding supplement (VITAL HIGH PROTEIN) liquid 1,000 mL, 1,000 mL, Per Tube, Q24H, [name], MD .  fentaNYL (SUBLIMAZE) 2,500 mcg in sodium chloride 0.9 % 250 mL (10 mcg/mL) infusion, 25-400 mcg/hr, Intravenous, Continuous, [name], MD, Stopped at 05/29/16 1000 .  fentaNYL (SUBLIMAZE) bolus via infusion 50 mcg, 50 mcg, Intravenous, Q1H PRN, [name], MD .  fentaNYL (SUBLIMAZE) injection 100 mcg, 100 mcg, Intravenous, Q15 min PRN, [name], MD .  heparin injection 5,000 Units, 5,000 Units, Subcutaneous, Q8H, [name], MD, 5,000 Units at 05/29/16 1404 .  insulin aspart (novoLOG) injection 0-15 Units, 0-15 Units, Subcutaneous, Q4H, [name], MD, 3 Units at 05/29/16 1146 .  labetalol (NORMODYNE,TRANDATE) injection 10 mg, 10 mg, Intravenous, Q6H PRN, Juliet Rude, MD, 10 mg at 05/29/16 0923 .  levETIRAcetam (KEPPRA) 1,000 mg in sodium chloride 0.9 % 100 mL IVPB, 1,000 mg, Intravenous, Q12H, Darrel Reach, MD, 1,000 mg at 05/29/16 7858 .  MEDLINE mouth rinse, 15 mL, Mouth Rinse, 10 times per day, [name], MD, 15 mL at 05/29/16 1628 .  midazolam (VERSED) injection 2 mg, 2 mg, Intravenous, Q2H PRN, [name], MD, 2 mg at 05/29/16 0416 .  multivitamin liquid 15 mL, 15 mL, Per Tube, Daily, [name], MD, 15 mL at 05/29/16 8502 .  ondansetron (ZOFRAN) injection 4 mg, 4 mg, Intravenous, Q6H  PRN, [name], MD, 4 mg at 05/27/16 0345 .  propofol (DIPRIVAN) 1000 MG/100ML infusion, 5-40 mcg/kg/min, Intravenous, Titrated, [name], MD, Last Rate: 9.6 mL/hr at 05/29/16 1445, 15 mcg/kg/min at 05/29/16 1445 .  sennosides (SENOKOT) 8.8 MG/5ML syrup 5 mL, 5 mL, Per Tube, BID, [name], MD, 5 mL at 05/29/16 1561 .  sodium chloride flush (NS) 0.9 % injection 10-40 mL, 10-40 mL, Intracatheter, Q12H, Jose Angelo A Corrie Dandy, MD, 30 mL at  05/29/16 [phone] .  sodium chloride flush (NS) 0.9 % injection 10-40 mL, 10-40 mL, Intracatheter, PRN, Jose Angelo A Corrie Dandy, MD, 10 mL at 05/28/16 2220 .  sodium chloride flush (NS) 0.9 % injection 3 mL, 3 mL, Intravenous, Q12H, [name], MD, 3 mL at 05/28/16 2200 .  sodium chloride flush (NS) 0.9 % injection 3 mL, 3 mL, Intravenous, PRN, [name], MD .  ticagrelor [facility]) tablet 90 mg, 90 mg, Oral, BID, [name], MD, 90 mg at 05/29/16 4327  Description: This is a routine EEG performed using standard international 10-20 electrode placement. A total of 18 channels are recorded, including one for the EKG. this study is performed in the intensive care unit with the patient intubated and unresponsive.   Activating Maneuvers: None  Findings:  The EKG channel demonstrates a regular rhythm with a rate of 80-90 beats per minute.   The background consists of accommodation of delta and theta frequencies. These average from 3-6 hertz. He was initially on propofol but this was stopped about halfway through the recording. Within a few minutes of stopping his propofol, there was an increase in the amount of theta activity with increased amplitudes as well. He demonstrated some reactivity, more pronounced after sedation was discontinued.   There are no focal asymmetries. No epileptiform discharges are present. No seizures are recorded.   The technologist noted episodes of twitching involving the right toes, head, and right hand at various points through the study. None of these showed any electrical seizure activity on the tracing.   Impression:  This is an abnormal EEG due to mild to moderate diffuse generalized slowing that improved somewhat with discontinuation of sedation. This tracing shows some slight spontaneous reactivity. Episodes of twitching on the right side were recorded but did not demonstrate any seizure activity on the EEG.  Clinical correlation: This EEG pattern is  consistent with a global encephalopathic process, in this case the history of anoxic brain injury. Episodes of twitching had been noticed by the nurse and these were recorded on the study with no seizure correlate on the EEG. These may represent myoclonic activity versus other hyperkinetic movement in the setting of anoxic injury to the nervous system but do not appear to have a cortical origin.   Melba Coon, MD [facility]

## 2016-05-30 ENCOUNTER — Inpatient Hospital Stay (HOSPITAL_COMMUNITY): Payer: BC Managed Care – PPO

## 2016-05-30 DIAGNOSIS — Z9911 Dependence on respirator [ventilator] status: Secondary | ICD-10-CM

## 2016-05-30 DIAGNOSIS — G9340 Encephalopathy, unspecified: Secondary | ICD-10-CM

## 2016-05-30 DIAGNOSIS — J9601 Acute respiratory failure with hypoxia: Secondary | ICD-10-CM

## 2016-05-30 LAB — CBC
HCT: 34.8 % — ABNORMAL LOW (ref 39.0–52.0)
Hemoglobin: 10.8 g/dL — ABNORMAL LOW (ref 13.0–17.0)
MCH: 27.7 pg (ref 26.0–34.0)
MCHC: 31 g/dL (ref 30.0–36.0)
MCV: 89.2 fL (ref 78.0–100.0)
Platelets: 171 10*3/uL (ref 150–400)
RBC: 3.9 MIL/uL — ABNORMAL LOW (ref 4.22–5.81)
RDW: 13.2 % (ref 11.5–15.5)
WBC: 10.6 10*3/uL — ABNORMAL HIGH (ref 4.0–10.5)

## 2016-05-30 LAB — GLUCOSE, CAPILLARY
GLUCOSE-CAPILLARY: 239 mg/dL — AB (ref 65–99)
GLUCOSE-CAPILLARY: 172 mg/dL — AB (ref 65–99)
Glucose-Capillary: 133 mg/dL — ABNORMAL HIGH (ref 65–99)
Glucose-Capillary: 149 mg/dL — ABNORMAL HIGH (ref 65–99)
Glucose-Capillary: 147 mg/dL — ABNORMAL HIGH (ref 65–99)

## 2016-05-30 LAB — BASIC METABOLIC PANEL
Anion gap: 8 (ref 5–15)
BUN: 37 mg/dL — AB (ref 6–20)
CALCIUM: 8.6 mg/dL — AB (ref 8.9–10.3)
CO2: 32 mmol/L (ref 22–32)
Chloride: 111 mmol/L (ref 101–111)
Creatinine, Ser: 1.12 mg/dL (ref 0.61–1.24)
GFR calc Af Amer: >60 mL/min (ref >60)
GLUCOSE: 155 mg/dL — AB (ref 65–99)
Potassium: 3.1 mmol/L — ABNORMAL LOW (ref 3.5–5.1)
Sodium: 151 mmol/L — ABNORMAL HIGH (ref 135–145)

## 2016-05-30 LAB — TRIGLYCERIDES: TRIGLYCERIDES: 112 mg/dL (ref <150)

## 2016-05-30 MED ORDER — ACETAMINOPHEN 650 MG RE SUPP
650.0000 mg | Freq: Four times a day (QID) | RECTAL | Status: DC | PRN
  Administered 2016-05-31: 650 mg via RECTAL
  Filled 2016-05-30: qty 1

## 2016-05-30 MED ORDER — LEVETIRACETAM 100 MG/ML PO SOLN
1000.0000 mg | Freq: Two times a day (BID) | ORAL | Status: DC
  Administered 2016-05-30 – 2016-06-01 (×5): 1000 mg
  Filled 2016-05-30 (×6): qty 10

## 2016-05-30 MED ORDER — POTASSIUM CHLORIDE 20 MEQ/15ML (10%) PO SOLN
30.0000 meq | ORAL | Status: AC
  Administered 2016-05-30 (×2): 30 meq
  Filled 2016-05-30 (×2): qty 30

## 2016-05-30 MED ORDER — ADULT MULTIVITAMIN W/MINERALS CH
1.0000 | ORAL_TABLET | Freq: Every day | ORAL | Status: DC
  Administered 2016-05-30 – 2016-06-17 (×16): 1 via ORAL
  Filled 2016-05-30 (×17): qty 1

## 2016-05-30 NOTE — Progress Notes (Signed)
[facility]AC[facility] ONLY  The [facility] does [facility] based on the criteria listed below:   1. Is GFR >/= 40 ml/min? Yes.    Patient's GFR today is >60 2. Is urine output >/= 0.5 ml/kg/hr for the last 6 hours? Yes.   Patient's UOP is 0.68 ml/kg/hr 3. Is BUN < 60 mg/dL? Yes.    Patient's BUN today is 37 4. Abnormal electrolyte(s): Potassium 3.1 5. Ordered repletion with: Potassium per protocol  Drystan Reader P 05/30/2016 6:50 AM

## 2016-05-30 NOTE — Progress Notes (Signed)
Neurology Progress Note  Subjective: No significant overnight events. Overall his neuro exam is unchanged. His friend is at the bedside and reports that the patient lifted his L arm off the bed twice this morning. He continues to open his eyes spontaneously and turns his head left to right but is not clearly tracking and has not shown overt purposeful activity. He remains intubated and is sedated with propofol. He is not able to participate with the exam due to his encephalopathy.   Pertinent meds:  Fentanyl drip OFF 05/29/16 at 1000 Propofol drip 5 mcg/kg/min Amiodarone drip 30 mg/hr Norepinephrine drip OFF 05/26/16 1000 Keppra 1000 mg q12h  Current Meds:   Current Facility-Administered Medications:  .  0.9 %  sodium chloride infusion, 250 mL, Intravenous, PRN, [name], MD, Last Rate: 10 mL/hr at 05/28/16 2000, 250 mL at 05/28/16 2000 .  Place/Maintain arterial line, , , Until Discontinued **AND** 0.9 %  sodium chloride infusion, , Intra-arterial, PRN, [name], MD .  acetaminophen (TYLENOL) tablet 650 mg, 650 mg, Oral, Q4H PRN, [name], MD, 650 mg at 05/29/16 2024 .  amiodarone (NEXTERONE PREMIX) 360-4.14 MG/200ML-% (1.8 mg/mL) IV infusion, 30 mg/hr, Intravenous, Continuous, [name], MD, Last Rate: 16.7 mL/hr at 05/30/16 0900, 30 mg/hr at 05/30/16 0900 .  aspirin chewable tablet 81 mg, 81 mg, Per Tube, Daily, [name], MD, 81 mg at 05/30/16 8546 .  atorvastatin (LIPITOR) tablet 80 mg, 80 mg, Per Tube, q1800, [name], MD, 80 mg at 05/29/16 1652 .  bisacodyl (DULCOLAX) suppository 10 mg, 10 mg, Rectal, Daily PRN, Juliet Rude, MD, 10 mg at 05/29/16 2703 .  carvedilol (COREG) tablet 12.5 mg, 12.5 mg, Per Tube, BID WC, Carly J Rivet, MD, 12.5 mg at 05/30/16 0849 .  chlorhexidine gluconate (MEDLINE KIT) (PERIDEX) 0.12 % solution 15 mL, 15 mL, Mouth Rinse, BID, [name], MD, 15 mL at 05/30/16 0849 .  dextrose 5 % solution, , Intravenous,  Continuous, Juliet Rude, MD, Last Rate: 60 mL/hr at 05/30/16 0849 .  docusate (COLACE) 50 MG/5ML liquid 100 mg, 100 mg, Per Tube, BID, [name], MD, 100 mg at 05/30/16 0903 .  famotidine (PEPCID) 40 MG/5ML suspension 20 mg, 20 mg, Oral, Daily, [name], MD, 20 mg at 05/29/16 5009 .  feeding supplement (PRO-STAT SUGAR FREE 64) liquid 60 mL, 60 mL, Per Tube, QID, [name], MD, 60 mL at 05/30/16 0903 .  feeding supplement (VITAL HIGH PROTEIN) liquid 1,000 mL, 1,000 mL, Per Tube, Q24H, [name], MD, 1,000 mL at 05/29/16 1736 .  fentaNYL (SUBLIMAZE) 2,500 mcg in sodium chloride 0.9 % 250 mL (10 mcg/mL) infusion, 25-400 mcg/hr, Intravenous, Continuous, [name], MD, Stopped at 05/29/16 1000 .  fentaNYL (SUBLIMAZE) bolus via infusion 50 mcg, 50 mcg, Intravenous, Q1H PRN, [name], MD .  fentaNYL (SUBLIMAZE) injection 100 mcg, 100 mcg, Intravenous, Q15 min PRN, [name], MD, 100 mcg at 05/30/16 0302 .  heparin injection 5,000 Units, 5,000 Units, Subcutaneous, Q8H, [name], MD, 5,000 Units at 05/30/16 [phone] .  insulin aspart (novoLOG) injection 0-15 Units, 0-15 Units, Subcutaneous, Q4H, [name], MD, 2 Units at 05/30/16 [phone]  labetalol (NORMODYNE,TRANDATE) injection 10 mg, 10 mg, Intravenous, Q6H PRN, Juliet Rude, MD, 10 mg at 05/29/16 0923 .  levETIRAcetam (KEPPRA) 100 MG/ML solution 1,000 mg, 1,000 mg, Per Tube, BID, [name], MD .  MEDLINE mouth rinse, 15 mL,  Mouth Rinse, 10 times per day, [name], MD, 15 mL at 05/30/16 0904 .  midazolam (VERSED) injection 2 mg, 2 mg, Intravenous, Q2H PRN, [name], MD, 2 mg at 05/30/16 0302 .  multivitamin with minerals tablet 1 tablet, 1 tablet, Oral, Daily, [name], MD, 1 tablet at 05/30/16 [phone] .  ondansetron [facility]) injection 4 mg, 4 mg, Intravenous, Q6H PRN, [name], MD, 4 mg at 05/27/16 0345 .  potassium chloride 20 MEQ/15ML (10%) solution 30 mEq, 30 mEq, Per  Tube, Q4H, Colbert Coyer, MD, 30 mEq at 05/30/16 0849 .  propofol (DIPRIVAN) 1000 MG/100ML infusion, 5-40 mcg/kg/min, Intravenous, Titrated, [name], MD, Last Rate: 3.2 mL/hr at 05/30/16 0900, 5 mcg/kg/min at 05/30/16 0900 .  sennosides (SENOKOT) 8.8 MG/5ML syrup 5 mL, 5 mL, Per Tube, BID, [name], MD, 5 mL at 05/30/16 0904 .  sodium chloride flush (NS) 0.9 % injection 10-40 mL, 10-40 mL, Intracatheter, Q12H, Jose Angelo A Corrie Dandy, MD, 20 mL at 05/30/16 2951 .  sodium chloride flush (NS) 0.9 % injection 10-40 mL, 10-40 mL, Intracatheter, PRN, Jose Angelo A Corrie Dandy, MD, 10 mL at 05/28/16 2220 .  sodium chloride flush (NS) 0.9 % injection 3 mL, 3 mL, Intravenous, Q12H, [name], MD, 3 mL at 05/30/16 0907 .  sodium chloride flush (NS) 0.9 % injection 3 mL, 3 mL, Intravenous, PRN, [name], MD .  ticagrelor [facility]) tablet 90 mg, 90 mg, Oral, BID, [name], MD, 90 mg at 05/30/16 8841  Objective:  Temp:  [97.9 F (36.6 C)-101.3 F (38.5 C)] 100.1 F (37.8 C) (01/26 0500) Pulse Rate:  [70-100] 80 (01/26 0925) Resp:  [8-29] 21 (01/26 0925) BP: (104-149)/(58-82) 108/61 (01/26 0900) SpO2:  [97 %-100 %] 100 % (01/26 0925) FiO2 (%):  [40 %] 40 % (01/26 0925) Weight:  [102.4 kg (225 lb 12 oz)] 102.4 kg (225 lb 12 oz) (01/26 0300)  General: WDWN Caucasian man lying in ICU bed. He is currently on propofol 64mg/kg/hr. His eyes open spontaneously and with verbal and tactile stimulation. He will turn his head from side to side though is not clearly fixing or tracking, at least not consistently--at times his eyes will settle on mine and he will occasionally turn his head to the side I am calling him from but this is not consistent. He also appears to track briefly. He does not follow any commands.  HEENT: Neck is supple without lymphadenopathy. ETT and OGT in place. Sclerae are anicteric. There is mild conjunctival injection. L IJ TLC in place.  CV: Regular, no  murmur. Carotid pulses are 2+ and symmetric with no bruits. Distal pulses 2+ and symmetric.  Lungs: CTAB on anterior exam. Ventilated. Extremities: No C/C/E.  Neuro: MS: As noted above.  CN: Pupils are equal and reactive from 3-->2 mm bilaterally. He blinks inconsistently to visual threat. His eyes are conjugate. Oculocephalics are sluggish. He has brisk corneals bilaterally. His face is grossly symmetric but is partly obscured by tubes and tape. He coughs spontaneously. The remainder of his cranial nerves cannot be accurately assessed as he is unable to participate with the exam.  Motor: Normal bulk. Tone is reduced throughout. He moved the L arm and R leg spontaneously. No tremor or other abnormal movements are observed.  Sensation: He has triple flexion to nailbed pressure and Babinski in BLE. No response to supraorbital pressure, UE nailbed pressure, or proximal pain.  DTRs: 2+, symmteric, mute toes  bilaterally. Coordination and gait: Unable to assess as the patient is unable to participate with the exam.   Labs: Lab Results  Component Value Date   WBC 10.6 (H) 05/30/2016   HGB 10.8 (L) 05/30/2016   HCT 34.8 (L) 05/30/2016   PLT 171 05/30/2016   GLUCOSE 155 (H) 05/30/2016   CHOL 109 05/24/2016   TRIG 109 05/27/2016   HDL 23 (L) 05/24/2016   LDLCALC 58 05/24/2016   ALT 84 (H) 05/27/2016   AST 33 05/27/2016   NA 151 (H) 05/30/2016   K 3.1 (L) 05/30/2016   CL 111 05/30/2016   CREATININE 1.12 05/30/2016   BUN 37 (H) 05/30/2016   CO2 32 05/30/2016   TSH 3.503 05/07/2016   INR 1.13 05/25/2016   HGBA1C 9.3 (H) 05/07/2016   MICROALBUR neg 10/20/2014   CBC Latest Ref Rng & Units 05/30/2016 05/29/2016 05/28/2016  WBC 4.0 - 10.5 K/uL 10.6(H) 11.5(H) 13.2(H)  Hemoglobin 13.0 - 17.0 g/dL 10.8(L) 10.8(L) 11.2(L)  Hematocrit 39.0 - 52.0 % 34.8(L) 35.1(L) 36.2(L)  Platelets 150 - 400 K/uL 171 168 190    Lab Results  Component Value Date   HGBA1C 9.3 (H) 05/07/2016   Lab Results   Component Value Date   ALT 84 (H) 05/27/2016   AST 33 05/27/2016   ALKPHOS 72 05/27/2016   BILITOT 0.8 05/27/2016    Radiology:  There is no new neuroimaging for review.   EEG from 05/29/16: Mild to moderate diffuse generalized slowing with some reactivity. No epileptiform discharges and no seizure.    A/P:   1. Anoxic brain injury: This is due to OOH cardiac arrest, presumed 2/2 ventricular arrhythmia in setting of STEMI. Per notes, family estimated his downtime could have been up to ten minutes. Treatment at this time is supportive. Today is day #6 post-arrest this afternoon and he has been normothermic for over 5 days.  2. 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 He appears to turn his head towards me when I speak to him, better on the L than the R. He also appears to briefly track my as I move about the bed. These were observed several times during the assessment. MRI did not show any evidence od cerebral cortical ischemia. Continue with supportive care. Avoid hypoxemia and hypotension for even brief intervals as these are associated with worse neurologic outcomes. Fever and hyperglycemia must be aggressively treated for the same reason. We will continue to follow the exam to aid with neurologic prognosis, which is guarded at this time. However, I am cautiously optimistic based on today's exam as he does appear to at least intermittently fix and track. I would continue supportive care. I would also consider a trial of a stimulant if he does not show any further improvement over the next day or two. Amantadine would be reasonable as it has little in the way of cardiac stimulation and could be started at 50 mg BID.   3. 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 Myoclonus is possible, however. I will continue Keppra to 1000 mg q12 for now.  4. Acute ischemic stroke: MRI shows a tiny infarct in the inferior L thalamus. This is likely of little clinical significance at this time. Continue aspirin and statin. If he is able to recover can  complete stroke workup at that time with vascular imaging.   I spoke with the patient's wife at the time of my visit. She was updated on my impression and plan. She was given the opportunity to ask any questions and these were addressed to her satisfaction.   This patient is critically ill and at significant risk of neurological worsening, death and care requires constant monitoring of vital signs, hemodynamics,respiratory and cardiac monitoring, neurological assessment, discussion with family, other specialists and medical decision making of high complexity. A total of 30 minutes of critical care time was spent on this case.   Melba Coon, MD [facility]

## 2016-05-30 NOTE — Progress Notes (Signed)
PULMONARY / CRITICAL CARE MEDICINE   ADMISSION DATE:  05/24/2016 CONSULTATION DATE:  05/24/2016  REFERRING MD :  Dr. Herbie BaltimoreHarding  CHIEF COMPLAINT:  STEMI/Post arrest  HISTORY OF PRESENT ILLNESS:   51 yo male with PMH of CAD(recently admitted from 1/3-1/197for MI, had PCI of Om2), HFrEF (EF 35%), DMII (on insulin), HLD and carotid disease.  Had intermittent fluid retention at home with one additional admission for this but today was in his usual state of health up until this morning. He was found sitting in a chair shaking by his family. Noted pulseless at 1441. CPR was started by family member, placed on AED and given 1 shock. Pulses returned. No medications given. EKG showed ST with LVH and repol abnormality. Code STEMI was activated. He was intubated in the ED.   He was taken to the cath lab and DES placed in Circumflex.  SUBJECTIVE:  Fever 101.3 overnight, 100.1 this AM. Opens eyes to voice, not following commands.   VITAL SIGNS: Temp:  [97.9 F (36.6 C)-101.3 F (38.5 C)] 98.5 F (36.9 C) (01/26 0000) Pulse Rate:  [70-101] 80 (01/26 0700) Resp:  [7-29] 15 (01/26 0700) BP: (104-169)/(56-105) 114/66 (01/26 0700) SpO2:  [97 %-100 %] 99 % (01/26 0700) FiO2 (%):  [40 %] 40 % (01/26 0700) Weight:  [225 lb 12 oz (102.4 kg)] 225 lb 12 oz (102.4 kg) (01/26 0300) HEMODYNAMICS:   VENTILATOR SETTINGS: Vent Mode: SIMV;PSV;Volume support FiO2 (%):  [40 %] 40 % Set Rate:  [10 bmp] 10 bmp Vt Set:  [580 mL] 580 mL PEEP:  [5 cmH20] 5 cmH20 Pressure Support:  [8 cmH20] 8 cmH20 Plateau Pressure:  [12 cmH20-20 cmH20] 18 cmH20 INTAKE / OUTPUT:  Intake/Output Summary (Last 24 hours) at 05/30/16 0743 Last data filed at 05/30/16 0700  Gross per 24 hour  Intake          1859.18 ml  Output             2675 ml  Net          -815.82 ml    PHYSICAL EXAMINATION: General: Middle aged man sedated on vent, opens eyes to voice HEENT:  ETT in place, line left IJ clean Cardiovascular:  RRR, no  m/r/g Lungs: CTA bilaterally Abdomen: Hypoactive BS about the same, soft, distended Musculoskeletal:  Normal bulk and tone Skin:  Cool, no rashes  Neuro: perr 4, moves lowers to pain, rass -1, opens eyes to voice   LABS:  CBC  Recent Labs Lab 05/28/16 0430 05/29/16 0925 05/30/16 0503  WBC 13.2* 11.5* 10.6*  HGB 11.2* 10.8* 10.8*  HCT 36.2* 35.1* 34.8*  PLT 190 168 171   Coag's  Recent Labs Lab 05/24/16 1537 05/25/16 0444  APTT 26 31  INR 1.07 1.13   BMET  Recent Labs Lab 05/29/16 0925 05/29/16 1751 05/30/16 0503  NA 153* 151* 151*  K 3.5 3.4* 3.1*  CL 113* 109 111  CO2 30 29 32  BUN 34* 36* 37*  CREATININE 1.05 1.16 1.12  GLUCOSE 80 151* 155*   Electrolytes  Recent Labs Lab 05/26/16 1727 05/27/16 0331 05/27/16 1701  05/29/16 0925 05/29/16 1751 05/30/16 0503  CALCIUM  --  8.4*  --   < > 8.2* 8.8* 8.6*  MG 2.1 2.3 2.4  --   --   --   --   PHOS 2.9 3.0 2.9  --   --   --   --   < > = values in this interval  not displayed. Sepsis Markers  Recent Labs Lab 05/26/16 0852 05/27/16 0331 05/28/16 0430  PROCALCITON <0.10 <0.10 <0.10   ABG  Recent Labs Lab 05/24/16 1644 05/24/16 1950 05/27/16 1354  PHART 7.215* 7.317* 7.397  PCO2ART 52.1* 49.9* 50.8*  PO2ART 109.0* 481* 107.0   Liver Enzymes  Recent Labs Lab 05/24/16 0822 05/24/16 1537 05/27/16 0331  AST 18 115* 33  ALT 28 166* 84*  ALKPHOS 65 53 72  BILITOT 0.2 0.6 0.8  ALBUMIN 4.1 3.5 2.8*   Cardiac Enzymes  Recent Labs Lab 05/24/16 1900 05/25/16 0444 05/25/16 1222  TROPONINI 0.44* 0.48* 0.19*   Glucose  Recent Labs Lab 05/29/16 1139 05/29/16 1642 05/29/16 2003 05/29/16 2347 05/29/16 2350 05/30/16 0527  GLUCAP 163* 121* 149* 246* 196* 133*    Imaging No results found. Lines/Tubes: ETT 1/20 >> L IJ CVL 1/20 >>  Cultures: None  Antibiotics: Unasyn 1/21>>>1/23  Studies: CT Head 1/20 >> negative CXR 1/20 >> Endotracheal tube in satisfactory position.  Worsening bilateral interstitial opacities which may reflect edema or infection. CXR 1/21 >> Tip of the left central line in the proximal SVC. No Pneumothorax. Developing hazy opacity at the right lung base, likely pleural fluid. Worsening interstitial opacities may be increasing pulmonary edema, pneumonia, or ARDS. EEG 1/20 >> moderate to severe generalized slowing.  No epileptiform discharges or seizures.  Improved some relative to burst suppression pattern seen yesterday.  MRI 1/24>> Possible 4 mm acute infarct left thalamus. No other acute infarct. Mild chronic ischemia of the convexity bilaterally right greater than left. EEG 1/25>>mild to moderate diffuse generalized slowing, improved somewhat with sedation discontinued. Episodes of twitching on right side recorded but did not show seizure activity.   ASSESSMENT / PLAN:  PULMONARY A: Intubated for airway protection Pulmonary edema Pulmonary nodule x3 right sided Unclear asp P:   Remains off antibiotics pcxr- improved aeration  Weaning this am PS 10 required Janina Mayo is NOT an option per family  CARDIOVASCULAR A:  Cardiogenic shock on Levophed- resolved NSVT STEMI s/p DES to Cx HFrEF HTn remains P:  Amiodarone gtt ASA 81 mg QD, atorvastatin 80 mg QD Coreg 12.5 mg BID Ticagralor 90 mg BID  RENAL A:   AKI- resolved Pulmonary edema Successful neg balance on lasix Hypokalemia Hypernatremia P:   Replace K Continue D5W, increase from 40 to 60 Repeat bmet this afternoon and AM kvo  GASTROINTESTINAL A:   Transaminitis - likely 2/2 ACS> resolved Nutrition GI PPx Abd distention/emesis- KUB with lrg stool burden, no SBO 1/23 P:   Famotidine for ppx NPO TFs trickle Senokot + dulcolax- had BM  HEMATOLOGIC A:   DVT Ppx P:  Heparin TID SQ   INFECTIOUS A:   Concern Asp PNA Fevers P:   Consider checking trach asp, UA  ENDOCRINE A:   Hyperglycemia  - more controlled DM-2 P:   SSI TFs  NEUROLOGIC A:    Sedated Anoxic Brain Injury- unlcear to what degree New Onset Seizures: EEG 1/20- moderate to severe generalized slowing.  No epileptiform discharges or seizures.  Improved some relative to burst suppression pattern seen. Twitching 1/25> EEG 1/25- generalized slowing, no seizure activity with twitching activity P:   RASS goal: 0 Fentanyl gtt off Propofol gtt Versed prn  Keppra 500 mg BID WUA Reassess neurostatus Monday, if no progress then comfort care   Rich Number, MD, MPH Internal Medicine Resident, PGY-III Pager: [phone] 05/30/2016 7:43 AM

## 2016-05-30 NOTE — Care Management Note (Addendum)
Case Management Note  Patient Details  Name: Douglas Carr MRN: [id number] Date of Birth: [birth year]  Subjective/Objective:      Adm w cardiac arrest, on vent              Action/Plan: lives w wife pta.    Expected Discharge Date:                  Expected Discharge Plan:     In-House Referral:  Hospice / Palliative Care  Discharge planning Services  CM Consult, Medication Assistance  Post Acute Care Choice:  NA Choice offered to:  NA  DME Arranged:  N/A DME Agency:  NA  HH Arranged:  NA HH Agency:  NA  Status of Service:  In process, will continue to follow  If discussed at Long Length of Stay Meetings, dates discussed:    Additional Comments: left pt brilinta 30day free and copay card for brilinta. S/W TERESA @ [facility] # [phone]   BRILINTA 90 MG BID   COVER- YES  CO-PAY- $ 30.00  PRIOR APPROVAL- NO  PHARMACY : [facility]   Hanley HaysDowell, Joane Postel T, RN 05/30/2016, 2:31 PM

## 2016-05-31 ENCOUNTER — Inpatient Hospital Stay (HOSPITAL_COMMUNITY): Payer: BC Managed Care – PPO

## 2016-05-31 DIAGNOSIS — J9601 Acute respiratory failure with hypoxia: Secondary | ICD-10-CM

## 2016-05-31 LAB — PHOSPHORUS: PHOSPHORUS: 4.3 mg/dL (ref 2.5–4.6)

## 2016-05-31 LAB — GLUCOSE, CAPILLARY
GLUCOSE-CAPILLARY: 207 mg/dL — AB (ref 65–99)
Glucose-Capillary: 144 mg/dL — ABNORMAL HIGH (ref 65–99)
Glucose-Capillary: 160 mg/dL — ABNORMAL HIGH (ref 65–99)
Glucose-Capillary: 173 mg/dL — ABNORMAL HIGH (ref 65–99)
Glucose-Capillary: 178 mg/dL — ABNORMAL HIGH (ref 65–99)
Glucose-Capillary: 201 mg/dL — ABNORMAL HIGH (ref 65–99)

## 2016-05-31 LAB — BASIC METABOLIC PANEL
Anion gap: 11 (ref 5–15)
Anion gap: 8 (ref 5–15)
BUN: 34 mg/dL — AB (ref 6–20)
BUN: 34 mg/dL — AB (ref 6–20)
CALCIUM: 8.5 mg/dL — AB (ref 8.9–10.3)
CHLORIDE: 115 mmol/L — AB (ref 101–111)
CO2: 31 mmol/L (ref 22–32)
CO2: 28 mmol/L (ref 22–32)
CREATININE: 1.08 mg/dL (ref 0.61–1.24)
Calcium: 8.7 mg/dL — ABNORMAL LOW (ref 8.9–10.3)
Chloride: 110 mmol/L (ref 101–111)
Creatinine, Ser: 0.94 mg/dL (ref 0.61–1.24)
GFR calc Af Amer: >60 mL/min (ref >60)
GFR calc non Af Amer: >60 mL/min (ref >60)
Glucose, Bld: 175 mg/dL — ABNORMAL HIGH (ref 65–99)
Glucose, Bld: 212 mg/dL — ABNORMAL HIGH (ref 65–99)
POTASSIUM: 3.7 mmol/L (ref 3.5–5.1)
Potassium: 3.4 mmol/L — ABNORMAL LOW (ref 3.5–5.1)
SODIUM: 152 mmol/L — AB (ref 135–145)
Sodium: 151 mmol/L — ABNORMAL HIGH (ref 135–145)

## 2016-05-31 LAB — MAGNESIUM: Magnesium: 2.7 mg/dL — ABNORMAL HIGH (ref 1.7–2.4)

## 2016-05-31 MED ORDER — FREE WATER
200.0000 mL | Status: DC
Start: 2016-05-31 — End: 2016-06-01
  Administered 2016-05-31 – 2016-06-01 (×7): 200 mL

## 2016-05-31 MED ORDER — SODIUM CHLORIDE 0.9 % IV SOLN
INTRAVENOUS | Status: DC
  Administered 2016-05-31: 17:00:00 via INTRAVENOUS

## 2016-05-31 MED ORDER — POTASSIUM CHLORIDE 20 MEQ/15ML (10%) PO SOLN
40.0000 meq | Freq: Once | ORAL | Status: AC
  Administered 2016-05-31: 40 meq
  Filled 2016-05-31: qty 30

## 2016-05-31 NOTE — Progress Notes (Signed)
Neurology Progress Note  Subjective: No significant overnight events apart from fever, Tmax 101.52F. RN reports some intermittent tracking overnight. He remains intubated and is sedated with propofol. He is not able to participate with the exam due to his encephalopathy.   Pertinent meds:  Fentanyl drip OFF 05/29/16 at 1000 Propofol drip 15 mcg/kg/min Amiodarone drip 30 mg/hr Norepinephrine drip OFF 05/26/16 1000 Keppra 1000 mg q12h  Current Meds:   Current Facility-Administered Medications:  .  Place/Maintain arterial line, , , Until Discontinued **AND** 0.9 %  sodium chloride infusion, , Intra-arterial, PRN, [name], MD .  acetaminophen (TYLENOL) suppository 650 mg, 650 mg, Rectal, Q6H PRN, Colbert Coyer, MD .  acetaminophen (TYLENOL) tablet 650 mg, 650 mg, Oral, Q4H PRN, [name], MD, 650 mg at 05/30/16 2114 .  amiodarone (NEXTERONE PREMIX) 360-4.14 MG/200ML-% (1.8 mg/mL) IV infusion, 30 mg/hr, Intravenous, Continuous, [name], MD, Last Rate: 16.7 mL/hr at 05/31/16 0037, 30 mg/hr at 05/31/16 0037 .  aspirin chewable tablet 81 mg, 81 mg, Per Tube, Daily, [name], MD, 81 mg at 05/30/16 3354 .  atorvastatin (LIPITOR) tablet 80 mg, 80 mg, Per Tube, q1800, [name], MD, 80 mg at 05/30/16 1811 .  bisacodyl (DULCOLAX) suppository 10 mg, 10 mg, Rectal, Daily PRN, Juliet Rude, MD, 10 mg at 05/29/16 5625 .  carvedilol (COREG) tablet 12.5 mg, 12.5 mg, Per Tube, BID WC, Carly J Rivet, MD, 12.5 mg at 05/30/16 1730 .  chlorhexidine gluconate (MEDLINE KIT) (PERIDEX) 0.12 % solution 15 mL, 15 mL, Mouth Rinse, BID, [name], MD, 15 mL at 05/30/16 2028 .  dextrose 5 % solution, , Intravenous, Continuous, Juliet Rude, MD, Last Rate: 60 mL/hr at 05/31/16 0429 .  docusate (COLACE) 50 MG/5ML liquid 100 mg, 100 mg, Per Tube, BID, [name], MD, 100 mg at 05/30/16 2208 .  famotidine (PEPCID) 40 MG/5ML suspension 20 mg, 20 mg, Oral, Daily, [name], MD, 20 mg at 05/30/16 1024 .  feeding supplement (PRO-STAT SUGAR FREE 64) liquid 60 mL, 60 mL, Per Tube, QID, [name], MD, 60 mL at 05/30/16 2208 .  feeding supplement (VITAL HIGH PROTEIN) liquid 1,000 mL, 1,000 mL, Per Tube, Q24H, [name], MD, 1,000 mL at 05/30/16 1655 .  fentaNYL (SUBLIMAZE) bolus via infusion 50 mcg, 50 mcg, Intravenous, Q1H PRN, [name], MD .  fentaNYL (SUBLIMAZE) injection 100 mcg, 100 mcg, Intravenous, Q15 min PRN, [name], MD, 100 mcg at 05/30/16 0302 .  heparin injection 5,000 Units, 5,000 Units, Subcutaneous, Q8H, [name], MD, 5,000 Units at 05/31/16 [phone] .  insulin aspart (novoLOG) injection 0-15 Units, 0-15 Units, Subcutaneous, Q4H, [name], MD, 3 Units at 05/31/16 [phone] .  labetalol (NORMODYNE,TRANDATE) injection 10 mg, 10 mg, Intravenous, Q6H PRN, Juliet Rude, MD, 10 mg at 05/29/16 0923 .  levETIRAcetam (KEPPRA) 100 MG/ML solution 1,000 mg, 1,000 mg, Per Tube, BID, [name], MD, 1,000 mg at 05/30/16 2350 .  MEDLINE mouth rinse, 15 mL, Mouth Rinse, 10 times per day, [name], MD, 15 mL at 05/31/16 0517 .  midazolam (VERSED) injection 2 mg, 2 mg, Intravenous, Q2H PRN, [name], MD, 2 mg at 05/30/16 0302 .  multivitamin with minerals tablet 1 tablet, 1 tablet, Oral, Daily, [name], MD, 1 tablet at 05/30/16 [phone] .  ondansetron [facility] injection 4 mg, 4 mg, Intravenous, Q6H PRN, [name], MD, 4 mg at 05/27/16  0345 .  propofol (DIPRIVAN) 1000 MG/100ML infusion, 5-40 mcg/kg/min, Intravenous, Titrated, [name], MD, Last Rate: 9.6 mL/hr at 05/31/16 0520, 15 mcg/kg/min at 05/31/16 0520 .  sennosides (SENOKOT) 8.8 MG/5ML syrup 5 mL, 5 mL, Per Tube, BID, [name], MD, 5 mL at 05/30/16 2208 .  sodium chloride flush (NS) 0.9 % injection 10-40 mL, 10-40 mL, Intracatheter, Q12H, Jose Angelo A de Rosebud, MD, 10 mL at 05/30/16 2209 .  ticagrelor (BRILINTA) tablet 90 mg, 90 mg, Oral,  BID, [name], MD, 90 mg at 05/30/16 2209  Objective:  Temp:  [98.7 F (37.1 C)-101.9 F (38.8 C)] 99.5 F (37.5 C) (01/27 0700) Pulse Rate:  [67-87] 72 (01/27 0700) Resp:  [0-32] 20 (01/27 0700) BP: (106-137)/(52-80) 121/64 (01/27 0700) SpO2:  [98 %-100 %] 99 % (01/27 0700) FiO2 (%):  [40 %] 40 % (01/27 0320) Weight:  [103.6 kg (228 lb 6.3 oz)] 103.6 kg (228 lb 6.3 oz) (01/27 0416)  General: WDWN Caucasian man lying in ICU bed. He is currently on propofol 30mg/kg/hr. This was held for the exam. His eyes open spontaneously and with verbal and tactile stimulation. He is much more responsive this morning, turning towards me when I call his name from either side. He fixes on me when I speak to him. He tracks me as I move around his bed and examine him. He also turns towards people passing in the hallway outside his door. He is able to stick out his tongue and open/close his eyes to command. HEENT: Neck is supple without lymphadenopathy. ETT and OGT in place. Sclerae are anicteric. There is mild conjunctival injection. L IJ TLC in place.  CV: Regular, no murmur. Carotid pulses are 2+ and symmetric with no bruits. Distal pulses 2+ and symmetric.  Lungs: CTAB on anterior exam. Ventilated. Extremities: No C/C/E.  Neuro: MS: As noted above.  CN: Pupils are equal and reactive from 3-->2 mm bilaterally. He blinks to visual threat. His eyes are conjugate. He has brisk corneals bilaterally. His face is grossly symmetric but is partly obscured by tubes and tape. He coughs spontaneously. The remainder of his cranial nerves cannot be accurately assessed as he is unable to participate with the exam.  Motor: Normal bulk. Tone is reduced throughout. He moves both arms spontaneously. No tremor or other abnormal movements are observed.  Sensation: He withdraws and grimaces to nailbed pressure x4.   DTRs: 2+, symmteric, mute toes bilaterally. Coordination and gait: Unable to assess as the patient is  unable to participate with the exam.   Labs: Lab Results  Component Value Date   WBC 10.6 (H) 05/30/2016   HGB 10.8 (L) 05/30/2016   HCT 34.8 (L) 05/30/2016   PLT 171 05/30/2016   GLUCOSE 175 (H) 05/31/2016   CHOL 109 05/24/2016   TRIG 112 05/30/2016   HDL 23 (L) 05/24/2016   LDLCALC 58 05/24/2016   ALT 84 (H) 05/27/2016   AST 33 05/27/2016   NA 152 (H) 05/31/2016   K 3.4 (L) 05/31/2016   CL 110 05/31/2016   CREATININE 1.08 05/31/2016   BUN 34 (H) 05/31/2016   CO2 31 05/31/2016   TSH 3.503 05/07/2016   INR 1.13 05/25/2016   HGBA1C 9.3 (H) 05/07/2016   MICROALBUR neg 10/20/2014   CBC Latest Ref Rng & Units 05/30/2016 05/29/2016 05/28/2016  WBC 4.0 - 10.5 K/uL 10.6(H) 11.5(H) 13.2(H)  Hemoglobin 13.0 - 17.0 g/dL 10.8(L) 10.8(L) 11.2(L)  Hematocrit 39.0 - 52.0 % 34.8(L) 35.1(L) 36.2(L)  Platelets 150 - 400 K/uL 171 168 190    Lab Results  Component Value Date   HGBA1C 9.3 (H) 05/07/2016   Lab Results  Component Value Date   ALT 84 (H) 05/27/2016   AST 33 05/27/2016   ALKPHOS 72 05/27/2016   BILITOT 0.8 05/27/2016    Radiology:  There is no new neuroimaging for review.   EEG from 05/29/16: Mild to moderate diffuse generalized slowing with some reactivity. No epileptiform discharges and no seizure.    A/P:   1. Anoxic brain injury: This is due to OOH cardiac arrest, presumed 2/2 ventricular arrhythmia in setting of STEMI. Per notes, family estimated his downtime could have been up to ten minutes. Treatment at this time is supportive. Today is day #7 post-arrest this afternoon and he has been normothermic for over 6 days.  2. 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 His exam is much better today--he is consistently fixing and tracking and will follow axial commands!!  MRI did not show any evidence od cerebral cortical ischemia. Continue with supportive care. Avoid hypoxemia and hypotension for even brief intervals as these are  associated with worse neurologic outcomes. Fever and hyperglycemia must be aggressively treated for the same reason. Given significant improvement today, prognosis for meaningful neurologic recovery is good. Continue supportive care. Defer neurostimulants for now as he is progressing well on his own.   3. Possible myoclonus: He was reported to have clinical seizure activity but this did not have an electrical correlate on EEG, essentially excluding epileptic seizure. Myoclonus is possible, however. I will continue Keppra to 1000 mg q12 for now.  4. Acute ischemic stroke: MRI shows a tiny infarct in the inferior L thalamus. This is likely of little clinical significance at this time. Continue aspirin and statin. If he is able to recover can complete stroke workup at that time with vascular imaging.   No family was present at the time of my visit today.   This patient is critically ill and at significant risk of neurological worsening, death and care requires constant monitoring of vital signs, hemodynamics,respiratory and cardiac monitoring, neurological assessment, discussion with family, other specialists and medical decision making of high complexity. A total of 30 minutes of critical care time was spent on this case.   Melba Coon, MD [facility]

## 2016-05-31 NOTE — Progress Notes (Signed)
eLink Physician-Brief Progress Note Patient Name: Douglas SermonsClifford M Carr DOB: Sep 15, 1965 MRN: [id number]   Date of Service  05/31/2016  HPI/Events of Note  Hypokalemia  eICU Interventions  Potassium replaced     Intervention Category Minor Interventions: Electrolytes abnormality - evaluation and management  DETERDING,ELIZABETH 05/31/2016, 5:29 AM

## 2016-05-31 NOTE — Progress Notes (Signed)
Patient Name: DORIN STOOKSBURY Date of Encounter: 05/31/2016  Primary Cardiologist: Dr. Serena Croissant Problem List     Principal Problem:   Cardiac arrest with ventricular fibrillation [facility]) Active Problems:   Essential hypertension   Status post coronary artery stent placement   Dilated cardiomyopathy ([place])   Coronary artery disease involving native coronary artery of native heart with angina pectoris ([place])   ST elevation myocardial infarction (STEMI) ([place])   Acute encephalopathy   Cardiac arrest ([place])   Anoxic brain injury ([place])   Anoxic encephalopathy ([place])   Seizures ([place])   Goals of care, counseling/discussion   Palliative care encounter   Central venous catheter in place   Encounter for hospice care discussion   Palliative care by specialist   Ventilator dependent ([place])   Acute respiratory failure with hypoxia ([place])     Subjective   Intubated; eyes opened; no purposeful movements but tracks  Inpatient Medications    Scheduled Meds: . aspirin  81 mg Per Tube Daily  . atorvastatin  80 mg Per Tube q1800  . carvedilol  12.5 mg Per Tube BID WC  . chlorhexidine gluconate (MEDLINE KIT)  15 mL Mouth Rinse BID  . docusate  100 mg Per Tube BID  . famotidine  20 mg Oral Daily  . feeding supplement (PRO-STAT SUGAR FREE 64)  60 mL Per Tube QID  . feeding supplement (VITAL HIGH PROTEIN)  1,000 mL Per Tube Q24H  . heparin  5,000 Units Subcutaneous Q8H  . insulin aspart  0-15 Units Subcutaneous Q4H  . levETIRAcetam  1,000 mg Per Tube BID  . mouth rinse  15 mL Mouth Rinse 10 times per day  . multivitamin with minerals  1 tablet Oral Daily  . sennosides  5 mL Per Tube BID  . sodium chloride flush  10-40 mL Intracatheter Q12H  . ticagrelor  90 mg Oral BID   Continuous Infusions: . amiodarone 30 mg/hr (05/31/16 0700)  . dextrose 60 mL/hr at 05/31/16 0429  . propofol (DIPRIVAN) infusion Stopped (05/31/16 0720)   PRN Meds: Place/Maintain arterial line **AND**  sodium chloride, acetaminophen, acetaminophen, bisacodyl, fentaNYL, fentaNYL (SUBLIMAZE) injection, labetalol, midazolam, ondansetron (ZOFRAN) IV   Vital Signs    Vitals:   05/31/16 0320 05/31/16 0416 05/31/16 0700 05/31/16 0730  BP:  (!) 112/59 121/64   Pulse: 68 71 72 71  Resp: 20 (!) 9 20 (!) 8  Temp: 99.9 F (37.7 C) 99.9 F (37.7 C) 99.5 F (37.5 C) 99.7 F (37.6 C)  TempSrc:  Core (Comment)    SpO2: 100% 99% 99% 100%  Weight:  228 lb 6.3 oz (103.6 kg)    Height:        Intake/Output Summary (Last 24 hours) at 05/31/16 0759 Last data filed at 05/31/16 0720  Gross per 24 hour  Intake          2813.42 ml  Output             1810 ml  Net          1003.42 ml   Filed Weights   05/29/16 0500 05/30/16 0300 05/31/16 0416  Weight: 227 lb 11.8 oz (103.3 kg) 225 lb 12 oz (102.4 kg) 228 lb 6.3 oz (103.6 kg)    Physical Exam     GEN:WD on vent; opens eyes HEENT: normal Neck: supple Cardiac:RRR Respiratory:CTA JQ:ZESP, non-distended  MS:No edema; No deformity. Neuro:sedate. Opens eye to voice. Does not follow commands    Labs  CBC  Recent Labs  05/29/16 0925 05/30/16 0503  WBC 11.5* 10.6*  HGB 10.8* 10.8*  HCT 35.1* 34.8*  MCV 90.2 89.2  PLT 168 300   Basic Metabolic Panel  Recent Labs  05/30/16 0503 05/31/16 0443  NA 151* 152*  K 3.1* 3.4*  CL 111 110  CO2 32 31  GLUCOSE 155* 175*  BUN 37* 34*  CREATININE 1.12 1.08  CALCIUM 8.6* 8.5*  MG  --  2.7*  PHOS  --  4.3   Fasting Lipid Panel  Recent Labs  05/30/16 1910  TRIG 112     Telemetry    NSR - Personally Reviewed  Radiology    Dg Chest Port 1 View  Result Date: 05/30/2016 CLINICAL DATA:  Check endotracheal tube placement EXAM: PORTABLE CHEST 1 VIEW COMPARISON:  05/28/2016 FINDINGS: Cardiac shadow is stable. Endotracheal tube, nasogastric catheter and left jugular central line are again seen and stable. Calcified granulomas are noted. Improving aeration in the left base is  noted. Some persistent atelectasis is seen. IMPRESSION: Improved aeration in the left base. Mild persistent left basilar atelectasis is noted. Tubes and lines as described. Electronically Signed   By: Inez Catalina M.D.   On: 05/30/2016 08:53    Cardiac Studies   Cardiac Cath: 01/05/32   LV end diastolic pressure is severely elevated.  Ost LAD to Prox LAD lesion, 30 %stenosed.  Ost 2nd Diag to 2nd Diag lesion, 50 %stenosed.  Ost 1st Diag to 1st Diag lesion, 70 %stenosed.  Ost Cx to Prox Cx lesion, 50 %stenosed.  Ost 1st Mrg to 1st Mrg lesion, 35 %stenosed.  2nd Mrg lesion, 90 %stenosed.  A STENT PROMUS PREM MR 2.5X24 drug eluting stent was successfully placed.  Post intervention, there is a 0% residual stenosis.  Prox RCA to Mid RCA lesion, 50 %stenosed.  RPDA lesion, 100 %stenosed.  1. Diffuse coronary artery disease with relatively small diabetic vessels.  2. Severe segmental stenosis in the second OM. Lesion is hazy and ulcerative. This is the culprit lesion 3. Severely elevated LVEDP 4. Successful stenting of the second OM with DES.  Plan: DAPT for one year. Aggressive risk factor modification. Will treat with IV lasix for elevated LVEDP. Add ARB, beta blocker as tolerated. Assess LV function with Echo. Trend serial troponins and ECG.      Cath 05/24/16  Ost Cx to Mid Cx lesion, 70 %stenosed leading into OM2 as the main trunk of the Circumflex.  Ost 2nd Mrg to 2nd Mrg recent Promus DES 2.5 x 24 stent, - focal 90 %stenosed with what appears to be proximal edge dissection with thrombus  A STENT PROMUS PREM MR 2.5X20 drug eluting stent was successfully placed from Ostium of Circumflex into OM2, and overlaps previously placed stent.  Post intervention, there is a 0% residual stenosis.  ____________________________________________________  Colon Flattery LAD to Prox LAD lesion, 30 %stenosed.  Ost 1st Diag to 1st Diag lesion, 70 %stenosed.  Ost 1st Mrg to 1st Mrg lesion, 35  %stenosed. Ost 2nd Diag to 2nd Diag lesion, 50 %stenosed.  Prox RCA to Mid RCA lesion, 65 %stenosed - lesion appears similar to prior cath, with mild progression.  Very distal RPDA lesion, 100 %stenosed.  LV end diastolic pressure is severely elevated.  There is no aortic valve stenosis.   Status post what is most likely VF or VT arrest 2 weeks post STEMI with PCI to the circumflex.  His EKG itself did not show signs of ST elevation, however given  his recent STEMI, known cardiac myopathy and presentation with cardiac arrest, I felt it prudent taken to the Cath Lab.  Angiographically there appeared to be a possible proximal edge dissection with thrombus in this lesion, however there is TIMI 2 flow. This lesion was treated with an overlapping proximal stent up to the ostium of the circumflex and OM 2. Angiographically there was good result. It is quite possible at least 1 and strut is into the left main, but non flow-limiting.  Plan:  He'll be admitted to the CCU on hypothermia protocol per PCCM  Continue amiodarone overnight as he appears to have converted into sinus rhythm  Hold ARB and Imdur for now. We'll try to start beta blocker.  Cardiology will follow along while on cooling protocol.   ECHO 05/15/16 - Left ventricle: The cavity size was moderately dilated. Wall thickness was normal. Systolic function was severely reduced. The estimated ejection fraction was in the range of 25% to 30%. Akinesis and scarring of the anteroseptal and anterior myocardium; consistent with infarction in the distribution of the left anterior descending coronary artery. Dyskinesis of the apicalanterior myocardium. Features are consistent with a pseudonormal left ventricular filling pattern, with concomitant abnormal relaxation and increased filling pressure (grade 2 diastolic dysfunction). No evidence of thrombus. - Left atrium: The atrium was mildly dilated. - Atrial septum:  No defect or patent foramen ovale was identified.  Patient Profile     51 y.o. 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 Felt 2/2 to VT/VF arrest, shocked by AED on the field. With recent STEMI, Dilated CM and presentation with cardiac arrest, he was taken back for cath. This showed possible edge dissection in circ, treated with an overlapping DES. He was placed on cooling protocol in the cath lab. Will DC amiodarone as arrest appears to have been ischemia mediated; follow for recurrent arrhythmias.  CAD: Status post PCI of circumflex. Continue aspirin, statin and brilinta.  Anoxic brain injury: neurology following; some improvement today. Neurology now feels his prognosis is better.  Chronic systolic HF/ischemic cardiomyopathy: Continue coreg; add lisinopril when extubated; will need life vest at DC and reassessment of LV function 3 months later; at that point if EF < 35 would need ICD.    NSVT: No recent NSVT.  Hyponatremia: on D5W. Follow   Kirk Ruths

## 2016-05-31 NOTE — Progress Notes (Signed)
PULMONARY / CRITICAL CARE MEDICINE   ADMISSION DATE:  05/24/2016 CONSULTATION DATE:  05/24/2016  REFERRING MD :  Dr. Herbie Baltimore  CHIEF COMPLAINT:  STEMI/Post arrest  BRIEF:  51 y/o male with known CAP and systolic CHF admitted on 1/20 with STEMI, cardiac arrest.  Has remained intubated since admission.  PCI placed to circ on admission.   SUBJECTIVE:  Following commands this morning Fever again overnight  VITAL SIGNS: Temp:  [98.7 F (37.1 C)-101.9 F (38.8 C)] 99.7 F (37.6 C) (01/27 0800) Pulse Rate:  [67-87] 75 (01/27 0800) Resp:  [0-32] 19 (01/27 0800) BP: (106-137)/(52-84) 119/84 (01/27 0800) SpO2:  [98 %-100 %] 100 % (01/27 0800) FiO2 (%):  [40 %] 40 % (01/27 0320) Weight:  [228 lb 6.3 oz (103.6 kg)] 228 lb 6.3 oz (103.6 kg) (01/27 0416) HEMODYNAMICS:   VENTILATOR SETTINGS: Vent Mode: SIMV;PRVC;PSV FiO2 (%):  [40 %] 40 % Set Rate:  [10 bmp] 10 bmp Vt Set:  [580 mL] 580 mL PEEP:  [5 cmH20] 5 cmH20 Pressure Support:  [8 cmH20-10 cmH20] 8 cmH20 Plateau Pressure:  [12 cmH20-18 cmH20] 14 cmH20 INTAKE / OUTPUT:  Intake/Output Summary (Last 24 hours) at 05/31/16 0851 Last data filed at 05/31/16 0835  Gross per 24 hour  Intake          2855.56 ml  Output             1660 ml  Net          1195.56 ml    PHYSICAL EXAMINATION: General: awake on vent HENT: NCAT ETT in place PULM: CTA B, vent supported breathing CV: RRR, no mgr GI: BS+, soft, nontender MSK: normal bulk and tone Neuro: awake, follows commands (lift head from pillow) consistently, flicker movement left arm   LABS:  CBC  Recent Labs Lab 05/28/16 0430 05/29/16 0925 05/30/16 0503  WBC 13.2* 11.5* 10.6*  HGB 11.2* 10.8* 10.8*  HCT 36.2* 35.1* 34.8*  PLT 190 168 171   Coag's  Recent Labs Lab 05/24/16 1537 05/25/16 0444  APTT 26 31  INR 1.07 1.13   BMET  Recent Labs Lab 05/29/16 1751 05/30/16 0503 05/31/16 0443  NA 151* 151* 152*  K 3.4* 3.1* 3.4*  CL 109 111 110  CO2 29 32 31   BUN 36* 37* 34*  CREATININE 1.16 1.12 1.08  GLUCOSE 151* 155* 175*   Electrolytes  Recent Labs Lab 05/27/16 0331 05/27/16 1701  05/29/16 1751 05/30/16 0503 05/31/16 0443  CALCIUM 8.4*  --   < > 8.8* 8.6* 8.5*  MG 2.3 2.4  --   --   --  2.7*  PHOS 3.0 2.9  --   --   --  4.3  < > = values in this interval not displayed. Sepsis Markers  Recent Labs Lab 05/26/16 0852 05/27/16 0331 05/28/16 0430  PROCALCITON <0.10 <0.10 <0.10   ABG  Recent Labs Lab 05/24/16 1644 05/24/16 1950 05/27/16 1354  PHART 7.215* 7.317* 7.397  PCO2ART 52.1* 49.9* 50.8*  PO2ART 109.0* 481* 107.0   Liver Enzymes  Recent Labs Lab 05/24/16 1537 05/27/16 0331  AST 115* 33  ALT 166* 84*  ALKPHOS 53 72  BILITOT 0.6 0.8  ALBUMIN 3.5 2.8*   Cardiac Enzymes  Recent Labs Lab 05/24/16 1900 05/25/16 0444 05/25/16 1222  TROPONINI 0.44* 0.48* 0.19*   Glucose  Recent Labs Lab 05/30/16 0903 05/30/16 1235 05/30/16 1648 05/30/16 2017 05/31/16 0022 05/31/16 0422  GLUCAP 149* 239* 172* 147* 201* 160*  Imaging Dg Chest Port 1 View  Result Date: 05/31/2016 CLINICAL DATA:  Ventilator dependent EXAM: PORTABLE CHEST 1 VIEW COMPARISON:  05/30/2016 FINDINGS: Calcified granulomas in the right lung. Low lung volumes. Heart is borderline in size. No confluent airspace opacities or effusion. Support devices are unchanged. IMPRESSION: Low lung volumes.  No confluent opacities. Electronically Signed   By: Charlett NoseKevin  Dover M.D.   On: 05/31/2016 08:05   Lines/Tubes: ETT 1/20 >> L IJ CVL 1/20 >>  Cultures: None  Antibiotics: Unasyn 1/21>>>1/23  Studies: CT Head 1/20 >> negative CXR 1/20 >> Endotracheal tube in satisfactory position. Worsening bilateral interstitial opacities which may reflect edema or infection. CXR 1/21 >> Tip of the left central line in the proximal SVC. No Pneumothorax. Developing hazy opacity at the right lung base, likely pleural fluid. Worsening interstitial opacities may  be increasing pulmonary edema, pneumonia, or ARDS. EEG 1/20 >> moderate to severe generalized slowing.  No epileptiform discharges or seizures.  Improved some relative to burst suppression pattern seen yesterday.  MRI 1/24>> Possible 4 mm acute infarct left thalamus. No other acute infarct. Mild chronic ischemia of the convexity bilaterally right greater than left. EEG 1/25>>mild to moderate diffuse generalized slowing, improved somewhat with sedation discontinued. Episodes of twitching on right side recorded but did not show seizure activity.   ASSESSMENT / PLAN:  PULMONARY A: Acute respiratory failure with hypoxemia Chronic R side pulmonary nodule P:   VAP prevention  Pressure support vent today as long as tolerated Pulmonary toilette measures Extubate when more awake  CARDIOVASCULAR A:  Cardiac arrest STEMI, s/p PCI to circumflex Systolic CHF Hypertension NSVT this admission P:  Amiodarone, anti-platelets, statin, B-blocker per cardiology Tele  RENAL A:   AKI resolved Hypokalemia Hypernatremia P:   Replace K Stop D5 Start free water via tube Repeat BMET later today Monitor BMET and UOP Replace electrolytes as needed  GASTROINTESTINAL A:   Nutrition needs Stress ulcer prophylaxsi P:   Tube feedings Continue famotidine  HEMATOLOGIC A:   No acute issues P:  Continue sub q heparin   INFECTIOUS A:   Fever 1/27, no clear source P:   Remove CVL Pan culture Continue APAP to treat fever aggressively  ENDOCRINE A:   DM2  hyperglycemia P:   Stop D5 Continue SSI  NEUROLOGIC A:   Acute encephalopathy> slow improvement, following commands 1/27 Seizures 1/20 P:   RASS goal 0 Wean off sedation Continue Keppra Control blood sugar, keep normothermic as able   My cc time 34 minutes  Attempted to call wife twice 1/27 AM, updated friend  Heber CarolinaBrent September Mormile, MD [facility] Pager: [phone] Cell: [phone] After 3pm or if no response, call  [phone]   05/31/2016 8:51 AM

## 2016-05-31 NOTE — Progress Notes (Signed)
Patient bagged and lavaged, obtaining a moderate amount of yellowish/tan secretions. Vitals stable.

## 2016-06-01 ENCOUNTER — Inpatient Hospital Stay (HOSPITAL_COMMUNITY): Payer: BC Managed Care – PPO

## 2016-06-01 DIAGNOSIS — G253 Myoclonus: Secondary | ICD-10-CM

## 2016-06-01 LAB — URINALYSIS, ROUTINE W REFLEX MICROSCOPIC
BILIRUBIN URINE: NEGATIVE
Glucose, UA: NEGATIVE mg/dL
KETONES UR: NEGATIVE mg/dL
Nitrite: NEGATIVE
Protein, ur: 30 mg/dL — AB
SPECIFIC GRAVITY, URINE: 1.023 (ref 1.005–1.030)
pH: 5 (ref 5.0–8.0)

## 2016-06-01 LAB — BASIC METABOLIC PANEL
ANION GAP: 9 (ref 5–15)
BUN: 37 mg/dL — ABNORMAL HIGH (ref 6–20)
CALCIUM: 8.5 mg/dL — AB (ref 8.9–10.3)
CHLORIDE: 114 mmol/L — AB (ref 101–111)
CO2: 28 mmol/L (ref 22–32)
Creatinine, Ser: 0.94 mg/dL (ref 0.61–1.24)
GFR calc non Af Amer: >60 mL/min (ref >60)
Glucose, Bld: 219 mg/dL — ABNORMAL HIGH (ref 65–99)
POTASSIUM: 3.6 mmol/L (ref 3.5–5.1)
Sodium: 151 mmol/L — ABNORMAL HIGH (ref 135–145)

## 2016-06-01 LAB — CBC
HEMATOCRIT: 35.2 % — AB (ref 39.0–52.0)
HEMOGLOBIN: 10.9 g/dL — AB (ref 13.0–17.0)
MCH: 27.9 pg (ref 26.0–34.0)
MCHC: 31 g/dL (ref 30.0–36.0)
MCV: 90.3 fL (ref 78.0–100.0)
Platelets: 167 10*3/uL (ref 150–400)
RBC: 3.9 MIL/uL — AB (ref 4.22–5.81)
RDW: 13.3 % (ref 11.5–15.5)
WBC: 12.8 10*3/uL — ABNORMAL HIGH (ref 4.0–10.5)

## 2016-06-01 LAB — GLUCOSE, CAPILLARY
GLUCOSE-CAPILLARY: 117 mg/dL — AB (ref 65–99)
GLUCOSE-CAPILLARY: 127 mg/dL — AB (ref 65–99)
GLUCOSE-CAPILLARY: 155 mg/dL — AB (ref 65–99)
GLUCOSE-CAPILLARY: 227 mg/dL — AB (ref 65–99)
Glucose-Capillary: 181 mg/dL — ABNORMAL HIGH (ref 65–99)
Glucose-Capillary: 192 mg/dL — ABNORMAL HIGH (ref 65–99)

## 2016-06-01 MED ORDER — ORAL CARE MOUTH RINSE
15.0000 mL | Freq: Two times a day (BID) | OROMUCOSAL | Status: DC
  Administered 2016-06-01 – 2016-06-02 (×5): 15 mL via OROMUCOSAL

## 2016-06-01 MED ORDER — CHLORHEXIDINE GLUCONATE 0.12 % MT SOLN
15.0000 mL | Freq: Two times a day (BID) | OROMUCOSAL | Status: DC
  Administered 2016-06-01 – 2016-06-02 (×3): 15 mL via OROMUCOSAL
  Filled 2016-06-01 (×2): qty 15

## 2016-06-01 MED ORDER — FENTANYL CITRATE (PF) 100 MCG/2ML IJ SOLN: 12.5000 ug | INTRAMUSCULAR | Status: DC | PRN

## 2016-06-01 MED ORDER — SODIUM CHLORIDE 0.9 % IV SOLN
1000.0000 mg | Freq: Two times a day (BID) | INTRAVENOUS | Status: DC
  Administered 2016-06-01 – 2016-06-12 (×23): 1000 mg via INTRAVENOUS
  Filled 2016-06-01 (×24): qty 10

## 2016-06-01 MED ORDER — SODIUM CHLORIDE 0.45 % IV SOLN
INTRAVENOUS | Status: DC
  Administered 2016-06-01: 17:00:00 via INTRAVENOUS

## 2016-06-01 MED ORDER — LISINOPRIL 2.5 MG PO TABS
2.5000 mg | ORAL_TABLET | Freq: Every day | ORAL | Status: DC
  Administered 2016-06-01: 2.5 mg via ORAL
  Filled 2016-06-01 (×2): qty 1

## 2016-06-01 NOTE — Progress Notes (Signed)
Patient Name: Douglas Carr Date of Encounter: 06/01/2016  Primary Cardiologist: Dr. Serena Croissant Problem List     Principal Problem:   Cardiac arrest with ventricular fibrillation [facility]) Active Problems:   Essential hypertension   Status post coronary artery stent placement   Dilated cardiomyopathy ([place])   Coronary artery disease involving native coronary artery of native heart with angina pectoris ([place])   ST elevation myocardial infarction (STEMI) ([place])   Acute encephalopathy   Cardiac arrest ([place])   Anoxic brain injury ([place])   Anoxic encephalopathy ([place])   Seizures ([place])   Goals of care, counseling/discussion   Palliative care encounter   Central venous catheter in place   Encounter for hospice care discussion   Palliative care by specialist   Ventilator dependent ([place])   Acute respiratory failure with hypoxia ([place])     Subjective   Intubated; tracks to voice and commands  Inpatient Medications    Scheduled Meds: . aspirin  81 mg Per Tube Daily  . atorvastatin  80 mg Per Tube q1800  . carvedilol  12.5 mg Per Tube BID WC  . chlorhexidine gluconate (MEDLINE KIT)  15 mL Mouth Rinse BID  . docusate  100 mg Per Tube BID  . famotidine  20 mg Oral Daily  . feeding supplement (PRO-STAT SUGAR FREE 64)  60 mL Per Tube QID  . feeding supplement (VITAL HIGH PROTEIN)  1,000 mL Per Tube Q24H  . free water  200 mL Per Tube Q4H  . heparin  5,000 Units Subcutaneous Q8H  . insulin aspart  0-15 Units Subcutaneous Q4H  . levETIRAcetam  1,000 mg Per Tube BID  . mouth rinse  15 mL Mouth Rinse 10 times per day  . multivitamin with minerals  1 tablet Oral Daily  . sennosides  5 mL Per Tube BID  . sodium chloride flush  10-40 mL Intracatheter Q12H  . ticagrelor  90 mg Oral BID   Continuous Infusions: . sodium chloride 10 mL/hr at 05/31/16 1715  . propofol (DIPRIVAN) infusion 5 mcg/kg/min (06/01/16 0724)   PRN Meds: Place/Maintain arterial line **AND** sodium chloride,  acetaminophen, acetaminophen, bisacodyl, fentaNYL, fentaNYL (SUBLIMAZE) injection, labetalol, midazolam, ondansetron (ZOFRAN) IV   Vital Signs    Vitals:   06/01/16 0500 06/01/16 0600 06/01/16 0700 06/01/16 0730  BP: 126/69 119/68 117/85   Pulse: 81 78 76 77  Resp: (!) 21 16 19 20   Temp:  99.9 F (37.7 C)  99.3 F (37.4 C)  TempSrc:  Rectal  Oral  SpO2: 100% 100% 100% 100%  Weight:      Height:        Intake/Output Summary (Last 24 hours) at 06/01/16 0753 Last data filed at 06/01/16 0700  Gross per 24 hour  Intake          1338.41 ml  Output             1880 ml  Net          -541.59 ml   Filed Weights   05/30/16 0300 05/31/16 0416 06/01/16 0327  Weight: 225 lb 12 oz (102.4 kg) 228 lb 6.3 oz (103.6 kg) 227 lb 15.3 oz (103.4 kg)    Physical Exam     GEN:WD on vent; opens eyes and tracks HEENT: normal Neck: supple Cardiac:RRR Respiratory:CTA WU:XLKG, non-distended  MS:No edema; No deformity. Neuro: Opens eye to voice and tracks    Labs    CBC  Recent Labs  05/30/16 0503 06/01/16 4010  WBC 10.6* 12.8*  HGB 10.8* 10.9*  HCT 34.8* 35.2*  MCV 89.2 90.3  PLT 171 048   Basic Metabolic Panel  Recent Labs  05/31/16 0443 05/31/16 1652 06/01/16 0219  NA 152* 151* 151*  K 3.4* 3.7 3.6  CL 110 115* 114*  CO2 31 28 28   GLUCOSE 175* 212* 219*  BUN 34* 34* 37*  CREATININE 1.08 0.94 0.94  CALCIUM 8.5* 8.7* 8.5*  MG 2.7*  --   --   PHOS 4.3  --   --    Fasting Lipid Panel  Recent Labs  05/30/16 1910  TRIG 112     Telemetry    NSR with occasional PVC - Personally Reviewed  Radiology    Dg Chest Port 1 View  Result Date: 05/31/2016 CLINICAL DATA:  Ventilator dependent EXAM: PORTABLE CHEST 1 VIEW COMPARISON:  05/30/2016 FINDINGS: Calcified granulomas in the right lung. Low lung volumes. Heart is borderline in size. No confluent airspace opacities or effusion. Support devices are unchanged. IMPRESSION: Low lung volumes.  No confluent  opacities. Electronically Signed   By: Rolm Baptise M.D.   On: 05/31/2016 08:05    Cardiac Studies   Cardiac Cath: 12/10/89   LV end diastolic pressure is severely elevated.  Ost LAD to Prox LAD lesion, 30 %stenosed.  Ost 2nd Diag to 2nd Diag lesion, 50 %stenosed.  Ost 1st Diag to 1st Diag lesion, 70 %stenosed.  Ost Cx to Prox Cx lesion, 50 %stenosed.  Ost 1st Mrg to 1st Mrg lesion, 35 %stenosed.  2nd Mrg lesion, 90 %stenosed.  A STENT PROMUS PREM MR 2.5X24 drug eluting stent was successfully placed.  Post intervention, there is a 0% residual stenosis.  Prox RCA to Mid RCA lesion, 50 %stenosed.  RPDA lesion, 100 %stenosed.  1. Diffuse coronary artery disease with relatively small diabetic vessels.  2. Severe segmental stenosis in the second OM. Lesion is hazy and ulcerative. This is the culprit lesion 3. Severely elevated LVEDP 4. Successful stenting of the second OM with DES.  Plan: DAPT for one year. Aggressive risk factor modification. Will treat with IV lasix for elevated LVEDP. Add ARB, beta blocker as tolerated. Assess LV function with Echo. Trend serial troponins and ECG.      Cath 05/24/16  Ost Cx to Mid Cx lesion, 70 %stenosed leading into OM2 as the main trunk of the Circumflex.  Ost 2nd Mrg to 2nd Mrg recent Promus DES 2.5 x 24 stent, - focal 90 %stenosed with what appears to be proximal edge dissection with thrombus  A STENT PROMUS PREM MR 2.5X20 drug eluting stent was successfully placed from [place] of Circumflex into OM2, and overlaps previously placed stent.  Post intervention, there is a 0% residual stenosis.  ____________________________________________________  Colon [place] LAD to Prox LAD lesion, 30 %stenosed.  Ost 1st Diag to 1st Diag lesion, 70 %stenosed.  Ost 1st Mrg to 1st Mrg lesion, 35 %stenosed. Ost 2nd Diag to 2nd Diag lesion, 50 %stenosed.  Prox RCA to Mid RCA lesion, 65 %stenosed - lesion appears similar to prior cath, with mild  progression.  Very distal RPDA lesion, 100 %stenosed.  LV end diastolic pressure is severely elevated.  There is no aortic valve stenosis.   Status post what is most likely VF or VT arrest 2 weeks post STEMI with PCI to the circumflex.  His EKG itself did not show signs of ST elevation, however given his recent STEMI, known cardiac myopathy and presentation with cardiac arrest, I felt it prudent  taken to the Cath Lab.  Angiographically there appeared to be a possible proximal edge dissection with thrombus in this lesion, however there is TIMI 2 flow. This lesion was treated with an overlapping proximal stent up to the ostium of the circumflex and OM 2. Angiographically there was good result. It is quite possible at least 1 and strut is into the left main, but non flow-limiting.  Plan:  He'll be admitted to the CCU on hypothermia protocol per PCCM  Continue amiodarone overnight as he appears to have converted into sinus rhythm  Hold ARB and Imdur for now. We'll try to start beta blocker.  Cardiology will follow along while on cooling protocol.   ECHO 05/15/16 - Left ventricle: The cavity size was moderately dilated. Wall thickness was normal. Systolic function was severely reduced. The estimated ejection fraction was in the range of 25% to 30%. Akinesis and scarring of the anteroseptal and anterior myocardium; consistent with infarction in the distribution of the left anterior descending coronary artery. Dyskinesis of the apicalanterior myocardium. Features are consistent with a pseudonormal left ventricular filling pattern, with concomitant abnormal relaxation and increased filling pressure (grade 2 diastolic dysfunction). No evidence of thrombus. - Left atrium: The atrium was mildly dilated. - Atrial septum: No defect or patent foramen ovale was identified.  Patient Profile     51 y.o. 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 Felt 2/2 to VT/VF arrest, shocked by AED on the field. With recent STEMI, Dilated CM and presentation with cardiac arrest, he was taken back for cath. This showed possible edge dissection in circ, treated with an overlapping DES. He was placed on cooling protocol in the cath lab. Amiodarone DCed and no significant arrhythmias in last 24 hrs.  CAD: Status post PCI of circumflex. Continue aspirin, statin and brilinta.  Anoxic brain injury: neurology following; some improvement today. Neurology now feels his prognosis is better.  Chronic systolic HF/ischemic cardiomyopathy: Continue coreg; add lisinopril 2.5 mg daily; will need life vest at DC and reassessment of LV function 3 months later; at that point if EF < 35 would need ICD.    NSVT: No recent NSVT.  Hyponatremia: on D5W. Follow   Kirk Ruths

## 2016-06-01 NOTE — Progress Notes (Signed)
PULMONARY / CRITICAL CARE MEDICINE   ADMISSION DATE:  05/24/2016 CONSULTATION DATE:  05/24/2016  REFERRING MD :  Dr. Herbie Baltimore  CHIEF COMPLAINT:  STEMI/Post arrest  BRIEF:  51 y/o male with known CAP and systolic CHF admitted on 1/20 with STEMI, cardiac arrest.  Has remained intubated since admission.  PCI placed to circ on admission.   SUBJECTIVE:  Weaned well yesterday. Becomes agitated when sedation decreased. Awake on vent, tracks. Following commands last night for nursing, but not for me this morning. Fever 101.5 this morning.  VITAL SIGNS: Temp:  [99.7 F (37.6 C)-101.8 F (38.8 C)] 99.9 F (37.7 C) (01/28 0600) Pulse Rate:  [71-83] 78 (01/28 0600) Resp:  [0-28] 16 (01/28 0600) BP: (95-143)/(37-118) 119/68 (01/28 0600) SpO2:  [88 %-100 %] 100 % (01/28 0600) FiO2 (%):  [40 %] 40 % (01/28 0400) Weight:  [227 lb 15.3 oz (103.4 kg)] 227 lb 15.3 oz (103.4 kg) (01/28 0327) HEMODYNAMICS:   VENTILATOR SETTINGS: Vent Mode: PRVC FiO2 (%):  [40 %] 40 % Set Rate:  [14 bmp] 14 bmp Vt Set:  [580 mL] 580 mL PEEP:  [5 cmH20] 5 cmH20 Pressure Support:  [5 cmH20-10 cmH20] 10 cmH20 Plateau Pressure:  [12 cmH20-18 cmH20] 18 cmH20 INTAKE / OUTPUT:  Intake/Output Summary (Last 24 hours) at 06/01/16 0705 Last data filed at 06/01/16 0600  Gross per 24 hour  Intake          1045.21 ml  Output             1880 ml  Net          -834.79 ml    PHYSICAL EXAMINATION: General: awake on vent HENT: [place]/AT, ETT in place PULM: CTA bilaterally, vent supported breathing CV: RRR, no m/g/r GI: BS+, soft, nontender MSK: moves all extremities  Neuro: awake, not following commands for me this morning, shaking of right arm   LABS:  CBC  Recent Labs Lab 05/29/16 0925 05/30/16 0503 06/01/16 0219  WBC 11.5* 10.6* 12.8*  HGB 10.8* 10.8* 10.9*  HCT 35.1* 34.8* 35.2*  PLT 168 171 167   Coag's No results for input(s): APTT, INR in the last 168 hours. BMET  Recent Labs Lab 05/31/16 0443  05/31/16 1652 06/01/16 0219  NA 152* 151* 151*  K 3.4* 3.7 3.6  CL 110 115* 114*  CO2 31 28 28   BUN 34* 34* 37*  CREATININE 1.08 0.94 0.94  GLUCOSE 175* 212* 219*   Electrolytes  Recent Labs Lab 05/27/16 0331 05/27/16 1701  05/31/16 0443 05/31/16 1652 06/01/16 0219  CALCIUM 8.4*  --   < > 8.5* 8.7* 8.5*  MG 2.3 2.4  --  2.7*  --   --   PHOS 3.0 2.9  --  4.3  --   --   < > = values in this interval not displayed. Sepsis Markers  Recent Labs Lab 05/26/16 0852 05/27/16 0331 05/28/16 0430  PROCALCITON <0.10 <0.10 <0.10   ABG  Recent Labs Lab 05/27/16 1354  PHART 7.397  PCO2ART 50.8*  PO2ART 107.0   Liver Enzymes  Recent Labs Lab 05/27/16 0331  AST 33  ALT 84*  ALKPHOS 72  BILITOT 0.8  ALBUMIN 2.8*   Cardiac Enzymes  Recent Labs Lab 05/25/16 1222  TROPONINI 0.19*   Glucose  Recent Labs Lab 05/31/16 0806 05/31/16 1207 05/31/16 1617 05/31/16 1950 06/01/16 0005 06/01/16 0403  GLUCAP 173* 178* 207* 144* 227* 181*    Imaging No results found. Lines/Tubes: ETT 1/20 >> L  IJ CVL 1/20 >>  Cultures: None  Antibiotics: Unasyn 1/21>>>1/23  Studies: CT Head 1/20 >> negative CXR 1/20 >> Endotracheal tube in satisfactory position. Worsening bilateral interstitial opacities which may reflect edema or infection. CXR 1/21 >> Tip of the left central line in the proximal SVC. No Pneumothorax. Developing hazy opacity at the right lung base, likely pleural fluid. Worsening interstitial opacities may be increasing pulmonary edema, pneumonia, or ARDS. EEG 1/20 >> moderate to severe generalized slowing.  No epileptiform discharges or seizures.  Improved some relative to burst suppression pattern seen yesterday.  MRI 1/24>> Possible 4 mm acute infarct left thalamus. No other acute infarct. Mild chronic ischemia of the convexity bilaterally right greater than left. EEG 1/25>>mild to moderate diffuse generalized slowing, improved somewhat with sedation  discontinued. Episodes of twitching on right side recorded but did not show seizure activity.   ASSESSMENT / PLAN:  PULMONARY A: Acute respiratory failure with hypoxemia Chronic R side pulmonary nodule P:   VAP prevention  Pressure support vent today as long as tolerated Pulmonary toilette measures Extubate when more awake, following commands  CARDIOVASCULAR A:  Cardiac arrest STEMI, s/p PCI to circumflex Systolic CHF Hypertension NSVT this admission P:  Amiodarone d/c per cards Anti-platelets, statin, B-blocker per cardiology Tele  RENAL A:   AKI resolved Hypokalemia- resolved Hypernatremia P:   Free water via tube Repeat BMET later today Monitor BMET and UOP Replace electrolytes as needed  GASTROINTESTINAL A:   Nutrition needs Stress ulcer prophylaxis P:   Tube feedings Continue famotidine  HEMATOLOGIC A:   No acute issues P:  Continue sub q heparin   INFECTIOUS A:   Fever 1/27, no clear source P:   CVL removed 1/27 Follow cultures Monitor fever curve Check UA Continue APAP to treat fever aggressively  ENDOCRINE A:   DM2  hyperglycemia P:   Continue SSI  NEUROLOGIC A:   Acute encephalopathy> slow improvement, following commands 1/27 Seizures 1/20 P:   RASS goal 0 Wean off sedation Continue Keppra Control blood sugar, keep normothermic as able   Rich Numberarly Kerianna Rawlinson, MD, MPH Internal Medicine Resident, PGY-III Pager: [phone]  06/01/2016 7:05 AM

## 2016-06-01 NOTE — Progress Notes (Addendum)
Daily Progress Note   Patient Name: Douglas Carr       Date: 06/01/2016 DOB: 1965/12/08  Age: 51 y.o. MRN#: [id number] Attending Physician: [name], MD Primary Care Physician: Wardell Honour, MD Admit Date: 05/24/2016  Reason for Consultation/Follow-up: Establishing goals of care and Psychosocial/spiritual support  Subjective: Douglas Carr is extubated.  He does not speak to me.  He does look at me but does not communicate.  He is having multiple involuntary movements of his head and left arm.  He is trying to cough up secretions.  I met with [name] in the conference room.  We talked about code status.  He is still a firm FULL CODE.  We discussed next steps (1) speech eval and the determination about whether or not we need to consider PEG;  (2) rehab - skilled nursing vs [facility]; (3) Life Vest and ICD.  [name] appreciates PMT support and asks that we continue to check in to help with support and decision making.  Patient Profile/HPI: 51 y.o. male  with past medical history of CAD, carotid stenosis (s/p CEA) CHF (25 - 30%), DM, recent MI 05/07/2016 who was admitted on 05/24/2016 with cardiac arrest. He was intubated.  A code STEMI was called and the patient was taken to the cath lab and stented.  The stent he received in early January had clogged and thrombosed. He completed hypothermic protocol.  Despite being comatose, Douglas Carr suffered a seizure.  Neurology is on board and Douglas Carr has been started on Keppra.  His most recent MRI showed several abnormalities but no "global anoxia" and no definite cause for his current neurological state.  He is not on pressors.  The family feels he is beginning to show signs of interacting with them (holding onto his sons hand).  1/28 Douglas Carr is extubated.   He will need SLP, PT, OT etc.  Cardiology has indicated that he will need a life vest at discharge and perhaps an ICD 3 months after discharge.  Length of Stay: 8  Current Medications: Scheduled Meds:  . aspirin  81 mg Per Tube Daily  . atorvastatin  80 mg Per Tube q1800  . carvedilol  12.5 mg Per Tube BID WC  . chlorhexidine  15 mL Mouth Rinse BID  . docusate  100 mg Per Tube  BID  . famotidine  20 mg Oral Daily  . free water  200 mL Per Tube Q4H  . heparin  5,000 Units Subcutaneous Q8H  . insulin aspart  0-15 Units Subcutaneous Q4H  . levETIRAcetam  1,000 mg Per Tube BID  . lisinopril  2.5 mg Oral Daily  . mouth rinse  15 mL Mouth Rinse q12n4p  . multivitamin with minerals  1 tablet Oral Daily  . sennosides  5 mL Per Tube BID  . ticagrelor  90 mg Oral BID    Continuous Infusions: . sodium chloride 10 mL/hr at 05/31/16 1715  . propofol (DIPRIVAN) infusion Stopped (06/01/16 0800)    PRN Meds: Place/Maintain arterial line **AND** sodium chloride, acetaminophen, acetaminophen, bisacodyl, fentaNYL, fentaNYL (SUBLIMAZE) injection, labetalol, midazolam, ondansetron (ZOFRAN) IV  Physical Exam        Wd wn extubated.  Does not speak.  Difficulty with being able to focus.  Involuntary movements of head and arms. Pupils equal and reactive.  CV rrr.  Distant heart sounds Resp Lungs coarse Abdomen  Nt, mildly distended, decreased bowel sounds.  Vital Signs: BP (!) 137/109 (BP Location: Right Arm)   Pulse 83   Temp 97.8 F (36.6 C) (Oral)   Resp (!) 24   Ht 5\' 9"  (1.753 m)   Wt 103.4 kg (227 lb 15.3 oz)   SpO2 97%   BMI 33.66 kg/m  SpO2: SpO2: 97 % O2 Device: O2 Device: Nasal Cannula O2 Flow Rate: O2 Flow Rate (L/min): 4 L/min  Intake/output summary:   Intake/Output Summary (Last 24 hours) at 06/01/16 1255 Last data filed at 06/01/16 1000  Gross per 24 hour  Intake           886.45 ml  Output             1875 ml  Net          -988.55 ml   LBM: Last BM Date:  06/01/16 Baseline Weight: Weight: 106.1 kg (234 lb) Most recent weight: Weight: 103.4 kg (227 lb 15.3 oz)       Palliative Assessment/Data:    Flowsheet Rows   Flowsheet Row Most Recent Value  Intake Tab  Referral Department  Cardiology  Unit at Time of Referral  ICU  Palliative Care Primary Diagnosis  Cardiac  Date Notified  05/25/16  Palliative Care Type  Return patient Palliative Care  Reason for referral  Clarify Goals of Care  Date of Admission  05/24/16  Date first seen by Palliative Care  05/26/16  # of days Palliative referral response time  1 Day(s)  # of days IP prior to Palliative referral  1  Clinical Assessment  Palliative Performance Scale Score  30%  Psychosocial & Spiritual Assessment  Palliative Care Outcomes  Patient/Family meeting held?  Yes  Who was at the meeting?  wife  Palliative Care Outcomes  Clarified goals of care      Patient Active Problem List   Diagnosis Date Noted  . Myoclonus   . Acute respiratory failure with hypoxia (HCC) 05/30/2016  . Ventilator dependent (HCC)   . Palliative care by specialist   . Encounter for hospice care discussion   . Anoxic brain injury (HCC)   . Anoxic encephalopathy (HCC)   . Seizures (HCC)   . Goals of care, counseling/discussion   . Palliative care encounter   . Central venous catheter in place   . Cardiac arrest (HCC) 05/25/2016  . Acute encephalopathy   . Cardiac arrest with ventricular  fibrillation ([place]) 05/24/2016  . Dilated cardiomyopathy ([place]) 05/24/2016  . Coronary artery disease involving native coronary artery of native heart with angina pectoris ([place]) 05/24/2016  . ST elevation myocardial infarction (STEMI) ([place])   . Chronic combined systolic and diastolic CHF, NYHA class 2 and ACA/AHA stage C ([place]) 05/13/2016  . Status post coronary artery stent placement   . Acute on chronic systolic heart failure ([place])   . Acute ST elevation myocardial infarction (STEMI) involving left circumflex coronary  artery ([place]) 05/07/2016  . Essential hypertension 09/21/2013  . Diabetes mellitus ([place]) 09/21/2013  . Combined hyperlipidemia associated with type 2 diabetes mellitus 09/21/2013  . Pulmonary nodules 10/16/2011    Palliative Care Plan    Recommendations/Plan:  PMT will continue to provide psycho social support to the family and be available to the medical team.  If he is not able to speak and function they want to let him pass  Need to consider code status with family.  Await speech therapy evaluation to determine if he will be able to eat / swallow safely.  I wonder if CIR MD would do a consultation to help the family assess how much functionality he may regain.  Goals of Care and Additional Recommendations:  Limitations on Scope of Treatment: Full Scope Treatment  Code Status:  Full code  Prognosis:   Unable to determine   Discharge Planning:  To Be Determined  Care plan was discussed with bedside RN and wife.  Thank you for allowing the Palliative Medicine Team to assist in the care of this patient.  Total time spent:  25     Greater than 50%  of this time was spent counseling and coordinating care related to the above assessment and plan.  Imogene Burn, PA-C Palliative Medicine  Please contact Palliative MedicineTeam phone at [phone] for questions and concerns between 7 am - 7 pm.   Please see AMION for individual provider pager numbers.

## 2016-06-01 NOTE — Progress Notes (Signed)
Neurology Progress Note  Subjective: Febrile again, Tmax 101.30F. He was off propofol for about 10 hours yesterday and remained very alert per RN. He had some agitation last night and was put back on propofol which was turned off again this morning. He remains intubated. He is not able to participate with the exam due to his encephalopathy.   Pertinent meds:  Fentanyl drip OFF 05/29/16 at 1000 Propofol drip 5 mcg/kg/min--OFF this morning for assessement  Keppra 1000 mg q12h  Current Meds:   Current Facility-Administered Medications:  .  Place/Maintain arterial line, , , Until Discontinued **AND** 0.9 %  sodium chloride infusion, , Intra-arterial, PRN, [name], MD .  0.9 %  sodium chloride infusion, , Intravenous, Continuous, Juanito Doom, MD, Last Rate: 10 mL/hr at 05/31/16 1715 .  acetaminophen (TYLENOL) suppository 650 mg, 650 mg, Rectal, Q6H PRN, Colbert Coyer, MD, 650 mg at 05/31/16 1225 .  acetaminophen (TYLENOL) tablet 650 mg, 650 mg, Oral, Q4H PRN, [name], MD, 650 mg at 06/01/16 0026 .  aspirin chewable tablet 81 mg, 81 mg, Per Tube, Daily, [name], MD, 81 mg at 05/31/16 1011 .  atorvastatin (LIPITOR) tablet 80 mg, 80 mg, Per Tube, q1800, [name], MD, 80 mg at 05/31/16 1623 .  bisacodyl (DULCOLAX) suppository 10 mg, 10 mg, Rectal, Daily PRN, Juliet Rude, MD, 10 mg at 05/29/16 1610 .  carvedilol (COREG) tablet 12.5 mg, 12.5 mg, Per Tube, BID WC, Carly J Rivet, MD, 12.5 mg at 05/31/16 1622 .  chlorhexidine gluconate (MEDLINE KIT) (PERIDEX) 0.12 % solution 15 mL, 15 mL, Mouth Rinse, BID, [name], MD, 15 mL at 05/31/16 1956 .  docusate (COLACE) 50 MG/5ML liquid 100 mg, 100 mg, Per Tube, BID, [name], MD, Stopped at 06/01/16 1000 .  famotidine (PEPCID) 40 MG/5ML suspension 20 mg, 20 mg, Oral, Daily, [name], MD, 20 mg at 05/31/16 1011 .  feeding supplement (PRO-STAT SUGAR FREE 64) liquid 60 mL, 60 mL, Per Tube, QID,  [name], MD, 60 mL at 05/31/16 2139 .  feeding supplement (VITAL HIGH PROTEIN) liquid 1,000 mL, 1,000 mL, Per Tube, Q24H, [name], MD, 1,000 mL at 05/31/16 1623 .  fentaNYL (SUBLIMAZE) bolus via infusion 50 mcg, 50 mcg, Intravenous, Q1H PRN, [name], MD .  fentaNYL (SUBLIMAZE) injection 100 mcg, 100 mcg, Intravenous, Q15 min PRN, [name], MD, 100 mcg at 05/30/16 0302 .  free water 200 mL, 200 mL, Per Tube, Q4H, Juanito Doom, MD, 200 mL at 06/01/16 0500 .  heparin injection 5,000 Units, 5,000 Units, Subcutaneous, Q8H, [name], MD, 5,000 Units at 06/01/16 [phone] .  insulin aspart (novoLOG) injection 0-15 Units, 0-15 Units, Subcutaneous, Q4H, [name], MD, 8 Units at 06/01/16 0418 .  labetalol (NORMODYNE,TRANDATE) injection 10 mg, 10 mg, Intravenous, Q6H PRN, Juliet Rude, MD, 10 mg at 05/29/16 0923 .  levETIRAcetam (KEPPRA) 100 MG/ML solution 1,000 mg, 1,000 mg, Per Tube, BID, [name], MD, 1,000 mg at 05/31/16 2139 .  lisinopril (PRINIVIL,ZESTRIL) tablet 2.5 mg, 2.5 mg, Oral, Daily, Lelon Perla, MD .  MEDLINE mouth rinse, 15 mL, Mouth Rinse, 10 times per day, [name], MD, 15 mL at 06/01/16 0650 .  midazolam (VERSED) injection 2 mg, 2 mg, Intravenous, Q2H PRN, [name], MD, 2 mg at 05/31/16 1624 .  multivitamin with minerals tablet 1 tablet, 1 tablet, Oral, Daily, [name], MD, 1  tablet at 05/31/16 1011 .  ondansetron [facility]) injection 4 mg, 4 mg, Intravenous, Q6H PRN, [name], MD, 4 mg at 05/27/16 0345 .  propofol (DIPRIVAN) 1000 MG/100ML infusion, 5-40 mcg/kg/min, Intravenous, Titrated, [name], MD, Last Rate: 3.2 mL/hr at 06/01/16 0724, 5 mcg/kg/min at 06/01/16 0724 .  sennosides (SENOKOT) 8.8 MG/5ML syrup 5 mL, 5 mL, Per Tube, BID, [name], MD, Stopped at 06/01/16 1000 .  sodium chloride flush (NS) 0.9 % injection 10-40 mL, 10-40 mL, Intracatheter, Q12H, Jose Angelo A de Hospers, MD, 10 mL at  05/31/16 2143 .  ticagrelor (BRILINTA) tablet 90 mg, 90 mg, Oral, BID, [name], MD, 90 mg at 05/31/16 2139  Objective:  Temp:  [99.3 F (37.4 C)-101.8 F (38.8 C)] 99.3 F (37.4 C) (01/28 0730) Pulse Rate:  [73-83] 77 (01/28 0730) Resp:  [0-28] 20 (01/28 0730) BP: (95-143)/(37-118) 117/85 (01/28 0700) SpO2:  [88 %-100 %] 100 % (01/28 0730) FiO2 (%):  [40 %] 40 % (01/28 0400) Weight:  [103.4 kg (227 lb 15.3 oz)] 103.4 kg (227 lb 15.3 oz) (01/28 0327)  General: WDWN Caucasian man lying in ICU bed. He is intubated, off propofol for about 5 minutes prior to my assessment. He noticed me walking into his room and readily fixes and tracks. He opens/closes eyes, sticks out tongue, lifts L arm, and wiggles toes to command.  HEENT: Neck is supple without lymphadenopathy. ETT and OGT in place. Sclerae are anicteric. There is mild conjunctival injection. L IJ TLC has been removed.   CV: Regular, no murmur. Carotid pulses are 2+ and symmetric with no bruits. Distal pulses 2+ and symmetric.  Lungs: Coarse upper airway sounds on anterior exam. Ventilated. Extremities: No C/C/E.  Neuro: MS: As noted above.  CN: Pupils are equal and reactive from 3-->2 mm bilaterally. He blinks to visual threat. His eyes are conjugate. EOMI with some breakup of smooth pursuits. He has brisk corneals bilaterally. His face is grossly symmetric but is partly obscured by tubes and tape. He has a symmteric frown and symmetric eye closure. He coughs spontaneously. The remainder of his cranial nerves cannot be accurately assessed as he is unable to participate with the exam.  Motor: Normal bulk. Tone is reduced throughout. He moves both arms spontaneously with at least 4-/5 strength. Some fine myoclonus is noted in the R face.  Sensation: He withdraws and grimaces to minimal noxious stimuli x4.   DTRs: 2+, symmteric, mute toes bilaterally. Coordination and gait: Unable to assess as the patient is unable to participate with  the exam.   Labs: Lab Results  Component Value Date   WBC 12.8 (H) 06/01/2016   HGB 10.9 (L) 06/01/2016   HCT 35.2 (L) 06/01/2016   PLT 167 06/01/2016   GLUCOSE 219 (H) 06/01/2016   CHOL 109 05/24/2016   TRIG 112 05/30/2016   HDL 23 (L) 05/24/2016   LDLCALC 58 05/24/2016   ALT 84 (H) 05/27/2016   AST 33 05/27/2016   NA 151 (H) 06/01/2016   K 3.6 06/01/2016   CL 114 (H) 06/01/2016   CREATININE 0.94 06/01/2016   BUN 37 (H) 06/01/2016   CO2 28 06/01/2016   TSH 3.503 05/07/2016   INR 1.13 05/25/2016   HGBA1C 9.3 (H) 05/07/2016   MICROALBUR neg 10/20/2014   CBC Latest Ref Rng & Units 06/01/2016 05/30/2016 05/29/2016  WBC 4.0 - 10.5 K/uL 12.8(H) 10.6(H) 11.5(H)  Hemoglobin 13.0 - 17.0 g/dL 10.9(L) 10.8(L) 10.8(L)  Hematocrit 39.0 - 52.0 %  35.2(L) 34.8(L) 35.1(L)  Platelets 150 - 400 K/uL 167 171 168    Lab Results  Component Value Date   HGBA1C 9.3 (H) 05/07/2016   Lab Results  Component Value Date   ALT 84 (H) 05/27/2016   AST 33 05/27/2016   ALKPHOS 72 05/27/2016   BILITOT 0.8 05/27/2016    Radiology:  There is no new neuroimaging for review.   A/P:   1. Anoxic brain injury: This is due to OOH cardiac arrest, presumed 2/2 ventricular arrhythmia in setting of STEMI. Per notes, family estimated his downtime could have been up to ten minutes. Treatment at this time is supportive. Today is day #8 post-arrest this afternoon and he has been normothermic for over 7 days.  2. 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 He continues to show gradual improvements in his neurologic status, now following appendicular commands. Continue with supportive care. Avoid hypoxemia and hypotension for even brief intervals as these are associated with worse neurologic outcomes. Fever and hyperglycemia must be aggressively treated for the same reason. Prognosis for meaningful neurologic recovery is good. Continue supportive care.   3. 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 He has some mild intermittent myoclonus. I will continue Keppra to 1000 mg q12 for now.  4. Acute ischemic stroke: MRI shows a tiny infarct in the inferior L thalamus. This is likely of little clinical significance at this time. Continue aspirin and statin. If he is able to recover can complete stroke workup at that time with vascular imaging.   I spoke with his friend at the bedside and updated him. He was given the chance to ask questions and these were addressed to his satisfaction.    This patient is critically ill and at significant risk of neurological worsening, death and care requires constant monitoring of vital signs, hemodynamics,respiratory and cardiac monitoring, neurological assessment, discussion with family, other specialists and medical decision making of high complexity. A total of 30 minutes of critical care time was spent on this case.   Melba Coon, MD [facility]

## 2016-06-01 NOTE — Progress Notes (Signed)
eLink Physician-Brief Progress Note Patient Name: Douglas SermonsClifford M Carr DOB: 01-06-1966 MRN: [id number]   Date of Service  06/01/2016  HPI/Events of Note  No longer getting sterile water for hypernatremia since ext and not able to take in po yet  Intake/Output Summary (Last 24 hours) at 06/01/16 1609 Last data filed at 06/01/16 1200  Gross per 24 hour  Intake           756.45 ml  Output              475 ml  Net           281.45 ml    Note bun/creat up and pt on acei/diabetic  eICU Interventions  One half NS at 75 cc /h     Intervention Category Major Interventions: Electrolyte abnormality - evaluation and management  Sandrea HughsMichael Wert 06/01/2016, 4:07 PM

## 2016-06-01 NOTE — Procedures (Signed)
Extubation Procedure Note  Patient Details:   Name: Margarita SermonsClifford M Yadao DOB: [birth year] MRN: [id number]   Airway Documentation:     Evaluation  O2 sats: stable throughout Complications: No apparent complications Patient did tolerate procedure well. Bilateral Breath Sounds: Rhonchi   Yes   Patient extubated to 4lnc. Vital signs stable at this time. No complications. RN at bedside.  RT will continue to monitor.  Ave Filterdkins, Dietra Stokely Williams 06/01/2016, 11:53 AM

## 2016-06-02 ENCOUNTER — Inpatient Hospital Stay (HOSPITAL_COMMUNITY): Payer: BC Managed Care – PPO

## 2016-06-02 DIAGNOSIS — E669 Obesity, unspecified: Secondary | ICD-10-CM

## 2016-06-02 DIAGNOSIS — I5043 Acute on chronic combined systolic (congestive) and diastolic (congestive) heart failure: Secondary | ICD-10-CM

## 2016-06-02 DIAGNOSIS — I10 Essential (primary) hypertension: Secondary | ICD-10-CM

## 2016-06-02 DIAGNOSIS — D72829 Elevated white blood cell count, unspecified: Secondary | ICD-10-CM

## 2016-06-02 DIAGNOSIS — R509 Fever, unspecified: Secondary | ICD-10-CM

## 2016-06-02 DIAGNOSIS — E1169 Type 2 diabetes mellitus with other specified complication: Secondary | ICD-10-CM

## 2016-06-02 DIAGNOSIS — Z955 Presence of coronary angioplasty implant and graft: Secondary | ICD-10-CM

## 2016-06-02 DIAGNOSIS — I420 Dilated cardiomyopathy: Secondary | ICD-10-CM

## 2016-06-02 DIAGNOSIS — I2121 ST elevation (STEMI) myocardial infarction involving left circumflex coronary artery: Secondary | ICD-10-CM

## 2016-06-02 DIAGNOSIS — G931 Anoxic brain damage, not elsewhere classified: Secondary | ICD-10-CM

## 2016-06-02 DIAGNOSIS — D62 Acute posthemorrhagic anemia: Secondary | ICD-10-CM

## 2016-06-02 DIAGNOSIS — J690 Pneumonitis due to inhalation of food and vomit: Secondary | ICD-10-CM

## 2016-06-02 DIAGNOSIS — R0902 Hypoxemia: Secondary | ICD-10-CM

## 2016-06-02 DIAGNOSIS — E870 Hyperosmolality and hypernatremia: Secondary | ICD-10-CM

## 2016-06-02 DIAGNOSIS — I25119 Atherosclerotic heart disease of native coronary artery with unspecified angina pectoris: Secondary | ICD-10-CM

## 2016-06-02 LAB — BASIC METABOLIC PANEL
ANION GAP: 9 (ref 5–15)
BUN: 32 mg/dL — ABNORMAL HIGH (ref 6–20)
CHLORIDE: 115 mmol/L — AB (ref 101–111)
CO2: 26 mmol/L (ref 22–32)
Calcium: 8.5 mg/dL — ABNORMAL LOW (ref 8.9–10.3)
Creatinine, Ser: 1.05 mg/dL (ref 0.61–1.24)
GFR calc Af Amer: >60 mL/min (ref >60)
GLUCOSE: 142 mg/dL — AB (ref 65–99)
POTASSIUM: 3.7 mmol/L (ref 3.5–5.1)
Sodium: 150 mmol/L — ABNORMAL HIGH (ref 135–145)

## 2016-06-02 LAB — CBC
HEMATOCRIT: 35.1 % — AB (ref 39.0–52.0)
HEMOGLOBIN: 10.8 g/dL — AB (ref 13.0–17.0)
MCH: 27.7 pg (ref 26.0–34.0)
MCHC: 30.8 g/dL (ref 30.0–36.0)
MCV: 90 fL (ref 78.0–100.0)
PLATELETS: 164 10*3/uL (ref 150–400)
RBC: 3.9 MIL/uL — AB (ref 4.22–5.81)
RDW: 13.4 % (ref 11.5–15.5)
WBC: 12.2 10*3/uL — AB (ref 4.0–10.5)

## 2016-06-02 LAB — GLUCOSE, CAPILLARY
GLUCOSE-CAPILLARY: 133 mg/dL — AB (ref 65–99)
GLUCOSE-CAPILLARY: 133 mg/dL — AB (ref 65–99)
Glucose-Capillary: 138 mg/dL — ABNORMAL HIGH (ref 65–99)
Glucose-Capillary: 131 mg/dL — ABNORMAL HIGH (ref 65–99)
Glucose-Capillary: 145 mg/dL — ABNORMAL HIGH (ref 65–99)

## 2016-06-02 LAB — TRIGLYCERIDES: Triglycerides: 120 mg/dL (ref <150)

## 2016-06-02 MED ORDER — MIDAZOLAM HCL 2 MG/2ML IJ SOLN
INTRAMUSCULAR | Status: AC
  Administered 2016-06-02: 2 mg
  Filled 2016-06-02: qty 2

## 2016-06-02 MED ORDER — SODIUM CHLORIDE 0.9 % IV SOLN
25.0000 ug/h | INTRAVENOUS | Status: DC
  Filled 2016-06-02 (×2): qty 50

## 2016-06-02 MED ORDER — ROCURONIUM BROMIDE 50 MG/5ML IV SOLN
50.0000 mg | Freq: Once | INTRAVENOUS | Status: AC
Start: 2016-06-02 — End: 2016-06-02
  Administered 2016-06-02: 50 mg via INTRAVENOUS
  Filled 2016-06-02: qty 5

## 2016-06-02 MED ORDER — MIDAZOLAM HCL 2 MG/2ML IJ SOLN: 2.0000 mg | Freq: Once | INTRAMUSCULAR | Status: AC

## 2016-06-02 MED ORDER — NOREPINEPHRINE BITARTRATE 1 MG/ML IV SOLN
0.0000 ug/min | INTRAVENOUS | Status: DC
  Administered 2016-06-02: 5 ug/min via INTRAVENOUS
  Filled 2016-06-02 (×2): qty 4

## 2016-06-02 MED ORDER — ETOMIDATE 2 MG/ML IV SOLN
20.0000 mg | Freq: Once | INTRAVENOUS | Status: AC
  Administered 2016-06-02: 20 mg via INTRAVENOUS

## 2016-06-02 MED ORDER — PROPOFOL 1000 MG/100ML IV EMUL
5.0000 ug/kg/min | INTRAVENOUS | Status: DC
  Administered 2016-06-02: 10 ug/kg/min via INTRAVENOUS
  Filled 2016-06-02: qty 100
  Filled 2016-06-02: qty 200

## 2016-06-02 MED ORDER — DEXMEDETOMIDINE HCL IN NACL 200 MCG/50ML IV SOLN
0.4000 ug/kg/h | INTRAVENOUS | Status: DC
  Administered 2016-06-02 (×2): 0.7 ug/kg/h via INTRAVENOUS
  Administered 2016-06-03: 0.3 ug/kg/h via INTRAVENOUS
  Administered 2016-06-03: 0.5 ug/kg/h via INTRAVENOUS
  Administered 2016-06-03 (×2): 0.7 ug/kg/h via INTRAVENOUS
  Filled 2016-06-02 (×7): qty 50
  Filled 2016-06-02: qty 100

## 2016-06-02 MED ORDER — ORAL CARE MOUTH RINSE
15.0000 mL | OROMUCOSAL | Status: DC
  Administered 2016-06-02 – 2016-06-05 (×25): 15 mL via OROMUCOSAL

## 2016-06-02 MED ORDER — FENTANYL CITRATE (PF) 100 MCG/2ML IJ SOLN
INTRAMUSCULAR | Status: AC
  Administered 2016-06-02: 100 ug
  Filled 2016-06-02: qty 2

## 2016-06-02 MED ORDER — FENTANYL CITRATE (PF) 100 MCG/2ML IJ SOLN: 100.0000 ug | Freq: Once | INTRAMUSCULAR | Status: DC

## 2016-06-02 MED ORDER — SODIUM CHLORIDE 0.9 % IV BOLUS (SEPSIS): 500.0000 mL | Freq: Once | INTRAVENOUS | Status: DC

## 2016-06-02 MED ORDER — VANCOMYCIN HCL IN DEXTROSE 1-5 GM/200ML-% IV SOLN
1000.0000 mg | Freq: Three times a day (TID) | INTRAVENOUS | Status: DC
  Administered 2016-06-02 – 2016-06-04 (×6): 1000 mg via INTRAVENOUS
  Filled 2016-06-02 (×7): qty 200

## 2016-06-02 MED ORDER — PIPERACILLIN-TAZOBACTAM 3.375 G IVPB
3.3750 g | Freq: Three times a day (TID) | INTRAVENOUS | Status: DC
  Administered 2016-06-02 – 2016-06-04 (×6): 3.375 g via INTRAVENOUS
  Filled 2016-06-02 (×7): qty 50

## 2016-06-02 MED ORDER — CHLORHEXIDINE GLUCONATE 0.12% ORAL RINSE (MEDLINE KIT)
15.0000 mL | Freq: Two times a day (BID) | OROMUCOSAL | Status: DC
  Administered 2016-06-03 – 2016-06-05 (×5): 15 mL via OROMUCOSAL

## 2016-06-02 NOTE — Progress Notes (Signed)
Noted reintubation. Please refer to Dr. Eliane DecreePatel's consultation today. We will follow at a distance. Please call with any questions. [phone]

## 2016-06-02 NOTE — Progress Notes (Signed)
Noted Palliative consult cancelled over the weekend.   Please reorder consult if needed.   Ocie BobKasie Mivaan Corbitt, AGNP-C Palliative Medicine  Please call Palliative Medicine team phone with any questions [phone]. For individual providers please see AMION.

## 2016-06-02 NOTE — Procedures (Signed)
Intubation Procedure Note KONNER SAIZ [id number] 1965-07-04  Procedure: Intubation Indications: Airway protection and maintenance  Procedure Details Consent: Risks of procedure as well as the alternatives and risks of each were explained to the (patient/caregiver).  Consent for procedure obtained. Time Out: Verified patient identification, verified procedure, site/side was marked, verified correct patient position, special equipment/implants available, medications/allergies/relevent history reviewed, required imaging and test results available.  Performed  Maximum sterile technique was used including gloves, hand hygiene and mask.  MAC    Evaluation Hemodynamic Status: BP stable throughout; O2 sats: stable throughout Patient's Current Condition: stable Complications: No apparent complications Patient did tolerate procedure well. Chest X-ray ordered to verify placement.  CXR: pending.   Jennet Maduro 06/02/2016

## 2016-06-02 NOTE — Progress Notes (Signed)
Subjective: Person is currently extubated but has a nasal cannula on. He is encephalopathic. He follows minimal commands such as sticking his tongue out, tracking my movements in the room, wiggling his toes. He does not follow any complex commands.  Exam: Vitals:   06/02/16 0800 06/02/16 0900  BP: (!) 127/106 (!) 128/98  Pulse: 81 85  Resp: (!) 23 (!) 24  Temp:          Gen: In bed, NAD MS: As stated above he follows very simple commands and with a delayed reaction. The commands he can do is stick out his tongue, smile, wiggle his toes, tracked me in the room. CN: April the ground reactive to light and accommodating at approximately 3 mm equally. Extraocular movements are intact. Corneals intact. Doll's intact. Minimal blink to threat Motor: Bilateral upper and lower extremity weakness with approximately 2/5 strength. No increased tone noted. Sensory: Sensation is intact DTR: Deep tendon reflexes are depressed  Pertinent Labs/Diagnostics: Sodium 150 Calcium 8.5 Cell count 12.2 UA shows hazy, rare bacteria, moderate leukocytes.  MRI brain that was obtained on 05/28/2016 showed a possible 4 mm acute infarct in the left thalamus  Felicie MornDavid Marnette Perkins PA-C [facility] [phone]  Impression:   1. Anoxic brain injury: This is due to OOH cardiac arrest, presumed 2/2 ventricular arrhythmia in setting of STEMI. Per notes, family estimated his downtime could have been up to ten minutes. Treatment at this time is supportive. Today is day #9 post-arrest this afternoon and he has been normothermic for over 8 days.  2. 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 He continues to show gradual improvements in his neurologic status, now following appendicular commands. Continue with supportive care. Avoid hypoxemia and hypotension for even brief intervals as these are associated with worse neurologic outcomes. Fever and hyperglycemia must be  aggressively treated for the same reason.    3. 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 He has some mild intermittent myoclonus. I will continue Keppra to 1000 mg q12 for now.  4. Acute ischemic stroke: MRI shows a tiny infarct in the inferior L thalamus. This is likely of little clinical significance at this time. Continue aspirin and statin. If he is able to recover can complete stroke workup at that time with vascular imaging.   Recommend: 1) we'll obtain MRI of brain without contrast to evaluate if there is any changes. 2) patient appears more sedated today than previously thus will obtain a routine EEG to see if there is any abnormalities or changes.  Dr. Amada JupiterKirkpatrick to addend this note  06/02/2016, 10:12 AM

## 2016-06-02 NOTE — Progress Notes (Signed)
PULMONARY / CRITICAL CARE MEDICINE   ADMISSION DATE:  05/24/2016 CONSULTATION DATE:  05/24/2016  REFERRING MD :  Dr. Herbie Baltimore  CHIEF COMPLAINT:  STEMI/Post arrest  BRIEF:  51 y/o male with known CAP and systolic CHF admitted on 1/20 with STEMI, cardiac arrest.  Has remained intubated since admission.  PCI placed to circ on admission.   SUBJECTIVE:  Extubated yesterday. Noted to have brown/dark tan, foul-smelling secretions last night. Maintaining sats. Will look at me and follow commands, has not verbalized yet. Fever 100.7 overnight.  VITAL SIGNS: Temp:  [97.8 F (36.6 C)-100.7 F (38.2 C)] 98.6 F (37 C) (01/29 0429) Pulse Rate:  [68-87] 82 (01/29 0700) Resp:  [19-33] 23 (01/29 0700) BP: (70-137)/(56-117) 130/105 (01/29 0700) SpO2:  [94 %-100 %] 97 % (01/29 0700) FiO2 (%):  [40 %] 40 % (01/28 1129) Weight:  [219 lb 12.8 oz (99.7 kg)] 219 lb 12.8 oz (99.7 kg) (01/29 0429) HEMODYNAMICS:   VENTILATOR SETTINGS: Vent Mode: PSV;CPAP FiO2 (%):  [40 %] 40 % PEEP:  [5 cmH20] 5 cmH20 Pressure Support:  [5 cmH20] 5 cmH20 INTAKE / OUTPUT:  Intake/Output Summary (Last 24 hours) at 06/02/16 0744 Last data filed at 06/02/16 0600  Gross per 24 hour  Intake          1201.98 ml  Output             1500 ml  Net          -298.02 ml    PHYSICAL EXAMINATION: General: Middle aged man resting in bed HENT: Superior/AT, EOMI, PERRL PULM: CTA bilaterally CV: RRR, no m/g/r GI: BS+, soft, nontender MSK: moves all extremities  Neuro: awake, follows commands    LABS:  CBC  Recent Labs Lab 05/30/16 0503 06/01/16 0219 06/02/16 0227  WBC 10.6* 12.8* 12.2*  HGB 10.8* 10.9* 10.8*  HCT 34.8* 35.2* 35.1*  PLT 171 167 164   Coag's No results for input(s): APTT, INR in the last 168 hours. BMET  Recent Labs Lab 05/31/16 1652 06/01/16 0219 06/02/16 0227  NA 151* 151* 150*  K 3.7 3.6 3.7  CL 115* 114* 115*  CO2 28 28 26   BUN 34* 37* 32*  CREATININE 0.94 0.94 1.05  GLUCOSE 212* 219*  142*   Electrolytes  Recent Labs Lab 05/27/16 0331 05/27/16 1701  05/31/16 0443 05/31/16 1652 06/01/16 0219 06/02/16 0227  CALCIUM 8.4*  --   < > 8.5* 8.7* 8.5* 8.5*  MG 2.3 2.4  --  2.7*  --   --   --   PHOS 3.0 2.9  --  4.3  --   --   --   < > = values in this interval not displayed. Sepsis Markers  Recent Labs Lab 05/26/16 0852 05/27/16 0331 05/28/16 0430  PROCALCITON <0.10 <0.10 <0.10   ABG  Recent Labs Lab 05/27/16 1354  PHART 7.397  PCO2ART 50.8*  PO2ART 107.0   Liver Enzymes  Recent Labs Lab 05/27/16 0331  AST 33  ALT 84*  ALKPHOS 72  BILITOT 0.8  ALBUMIN 2.8*   Cardiac Enzymes No results for input(s): TROPONINI, PROBNP in the last 168 hours. Glucose  Recent Labs Lab 06/01/16 0723 06/01/16 1159 06/01/16 1526 06/01/16 2100 06/02/16 0006 06/02/16 0432  GLUCAP 155* 192* 127* 117* 133* 133*    Imaging Dg Abd Portable 1v  Result Date: 06/01/2016 CLINICAL DATA:  Nasogastric tube was advanced. EXAM: PORTABLE ABDOMEN - 1 VIEW COMPARISON:  Earlier this day at 2252 hours FINDINGS: Tip of  the enteric tube in the right upper quadrant of the abdomen, possible stomach or proximal duodenum. Side port of the enteric tube in the stomach. Nonobstructive bowel gas pattern in the visualized upper abdomen. IMPRESSION: Enteric tube in the stomach, tip either in the distal stomach or proximal duodenum. Electronically Signed   By: Rubye OaksMelanie  Ehinger M.D.   On: 06/01/2016 23:32   Dg Abd Portable 1v  Result Date: 06/01/2016 CLINICAL DATA:  Initial evaluation for NG tube placement. EXAM: PORTABLE ABDOMEN - 1 VIEW COMPARISON:  Prior radiograph from 05/27/2016. FINDINGS: NG tube in place. Tip appears to lie at or just above the GE junction, with side hole likely an knee esophagus. Advancement by approximately 7 cm is suggested. Visualized bowel gas pattern within normal limits. IMPRESSION: Tip of NG tube at or just above the GE junction. Advancement by approximately 7 cm is  suggested. Electronically Signed   By: Rise MuBenjamin  McClintock M.D.   On: 06/01/2016 23:17   Lines/Tubes: ETT 1/20 >>1/28 L IJ CVL 1/20 >>1/28  Cultures: Resp 1/28>>  Blood 1/27>>  Antibiotics: Unasyn 1/21>>>1/23  Studies: CT Head 1/20 >> negative CXR 1/20 >> Endotracheal tube in satisfactory position. Worsening bilateral interstitial opacities which may reflect edema or infection. CXR 1/21 >> Tip of the left central line in the proximal SVC. No Pneumothorax. Developing hazy opacity at the right lung base, likely pleural fluid. Worsening interstitial opacities may be increasing pulmonary edema, pneumonia, or ARDS. EEG 1/20 >> moderate to severe generalized slowing.  No epileptiform discharges or seizures.  Improved some relative to burst suppression pattern seen yesterday.  MRI 1/24>> Possible 4 mm acute infarct left thalamus. No other acute infarct. Mild chronic ischemia of the convexity bilaterally right greater than left. EEG 1/25>>mild to moderate diffuse generalized slowing, improved somewhat with sedation discontinued. Episodes of twitching on right side recorded but did not show seizure activity.   ASSESSMENT / PLAN:  PULMONARY A: Acute respiratory failure with hypoxemia Chronic R side pulmonary nodule Extubated 1/28 P:   Supp oxygen as needed to maintain O2 sats >92% Suctioning as needed for secretions  CARDIOVASCULAR A:  Cardiac arrest STEMI, s/p PCI to circumflex Systolic CHF Hypertension NSVT this admission P:  Anti-platelets, statin, B-blocker per cardiology Tele Lisinopril  RENAL A:   AKI resolved Hypokalemia- resolved Hypernatremia P:   1/2 NS at 75 ml/hr Monitor BMET and UOP Replace electrolytes as needed  GASTROINTESTINAL A:   Nutrition needs Stress ulcer prophylaxis P:   SLP eval NPO for now Continue famotidine  HEMATOLOGIC A:   No acute issues P:  Continue sub q heparin   INFECTIOUS A:   Continued mild fevers- no clear source P:    CVL removed 1/27 Follow cultures Monitor fever curve Check UA- neg  Continue APAP to treat fever aggressively  ENDOCRINE A:   DM2  hyperglycemia P:   Continue SSI  NEUROLOGIC A:   Acute encephalopathy> slow improvement, following commands Seizures 1/20  P:   Continue Keppra Control blood sugar, keep normothermic as able   Rich Numberarly Rivet, MD, MPH Internal Medicine Resident, PGY-III Pager: [phone]: [phone]  06/02/2016 7:44 AM  Alyson ReedyWesam G. Elber Galyean, M.D. [facility] Pulmonary/Critical Care Medicine. Pager: [phone]. After hours pager: [phone].

## 2016-06-02 NOTE — Progress Notes (Signed)
Patient Name: Margarita SermonsClifford M Bushart Date of Encounter: 06/02/2016  Primary Cardiologist: Dr. Crista CurbBerry  Hospital Problem List     Principal Problem:   Cardiac arrest with ventricular fibrillation [facility](HCC) Active Problems:   Essential hypertension   Status post coronary artery stent placement   Dilated cardiomyopathy (HCC)   Coronary artery disease involving native coronary artery of native heart with angina pectoris (HCC)   ST elevation myocardial infarction (STEMI) (HCC)   Acute encephalopathy   Cardiac arrest (HCC)   Anoxic brain injury (HCC)   Anoxic encephalopathy (HCC)   Seizures (HCC)   Goals of care, counseling/discussion   Palliative care encounter   Central venous catheter in place   Encounter for hospice care discussion   Palliative care by specialist   Ventilator dependent (HCC)   Acute respiratory failure with hypoxia (HCC)   Myoclonus     Subjective   IThe patient had to be re-intubated this morning, since extubation he would make only minimal neurologic progress.   Inpatient Medications    Scheduled Meds: . aspirin  81 mg Per Tube Daily  . atorvastatin  80 mg Per Tube q1800  . carvedilol  12.5 mg Per Tube BID WC  . chlorhexidine  15 mL Mouth Rinse BID  . docusate  100 mg Per Tube BID  . famotidine  20 mg Oral Daily  . heparin  5,000 Units Subcutaneous Q8H  . insulin aspart  0-15 Units Subcutaneous Q4H  . levETIRAcetam  1,000 mg Intravenous Q12H  . lisinopril  2.5 mg Oral Daily  . mouth rinse  15 mL Mouth Rinse q12n4p  . multivitamin with minerals  1 tablet Oral Daily  . piperacillin-tazobactam (ZOSYN)  IV  3.375 g Intravenous Q8H  . sennosides  5 mL Per Tube BID  . ticagrelor  90 mg Oral BID  . vancomycin  1,000 mg Intravenous Q8H   Continuous Infusions: . dexmedetomidine    . fentaNYL infusion INTRAVENOUS     PRN Meds: acetaminophen, acetaminophen, bisacodyl, labetalol, ondansetron (ZOFRAN) IV   Vital Signs    Vitals:   06/02/16 0700 06/02/16  0757 06/02/16 0800 06/02/16 0900  BP: (!) 130/105  (!) 127/106 (!) 128/98  Pulse: 82  81 85  Resp: (!) 23  (!) 23 (!) 24  Temp:  98.4 F (36.9 C)    TempSrc:  Oral    SpO2: 97%  100% (!) 86%  Weight:      Height:        Intake/Output Summary (Last 24 hours) at 06/02/16 1110 Last data filed at 06/02/16 0600  Gross per 24 hour  Intake           1077.5 ml  Output             1500 ml  Net           -422.5 ml   Filed Weights   05/31/16 0416 06/01/16 0327 06/02/16 0429  Weight: 228 lb 6.3 oz (103.6 kg) 227 lb 15.3 oz (103.4 kg) 219 lb 12.8 oz (99.7 kg)    Physical Exam     GEN:WD on vent; opens eyes and tracks HEENT: normal Neck: supple Cardiac:RRR Respiratory:CTA RU:EAVWGI:Soft, non-distended  MS:No edema; No deformity. Neuro: Opens eye to voice and tracks    Labs    CBC  Recent Labs  06/01/16 0219 06/02/16 0227  WBC 12.8* 12.2*  HGB 10.9* 10.8*  HCT 35.2* 35.1*  MCV 90.3 90.0  PLT 167 164   Basic Metabolic Panel  Recent Labs  05/31/16 0443  06/01/16 0219 06/02/16 0227  NA 152*  < > 151* 150*  K 3.4*  < > 3.6 3.7  CL 110  < > 114* 115*  CO2 31  < > 28 26  GLUCOSE 175*  < > 219* 142*  BUN 34*  < > 37* 32*  CREATININE 1.08  < > 0.94 1.05  CALCIUM 8.5*  < > 8.5* 8.5*  MG 2.7*  --   --   --   PHOS 4.3  --   --   --   < > = values in this interval not displayed. Fasting Lipid Panel  Recent Labs  05/30/16 1910  TRIG 112     Telemetry    NSR with occasional PVC - Personally Reviewed  Radiology    Dg Abd Portable 1v  Result Date: 06/01/2016 CLINICAL DATA:  Nasogastric tube was advanced. EXAM: PORTABLE ABDOMEN - 1 VIEW COMPARISON:  Earlier this day at 2252 hours FINDINGS: Tip of the enteric tube in the right upper quadrant of the abdomen, possible stomach or proximal duodenum. Side port of the enteric tube in the stomach. Nonobstructive bowel gas pattern in the visualized upper abdomen. IMPRESSION: Enteric tube in the stomach, tip either in  the distal stomach or proximal duodenum. Electronically Signed   By: Rubye Oaks M.D.   On: 06/01/2016 23:32   Dg Abd Portable 1v  Result Date: 06/01/2016 CLINICAL DATA:  Initial evaluation for NG tube placement. EXAM: PORTABLE ABDOMEN - 1 VIEW COMPARISON:  Prior radiograph from 05/27/2016. FINDINGS: NG tube in place. Tip appears to lie at or just above the GE junction, with side hole likely an knee esophagus. Advancement by approximately 7 cm is suggested. Visualized bowel gas pattern within normal limits. IMPRESSION: Tip of NG tube at or just above the GE junction. Advancement by approximately 7 cm is suggested. Electronically Signed   By: Rise Mu M.D.   On: 06/01/2016 23:17    Cardiac Studies   Cardiac Cath: 05/07/16   LV end diastolic pressure is severely elevated.  Ost LAD to Prox LAD lesion, 30 %stenosed.  Ost 2nd Diag to 2nd Diag lesion, 50 %stenosed.  Ost 1st Diag to 1st Diag lesion, 70 %stenosed.  Ost Cx to Prox Cx lesion, 50 %stenosed.  Ost 1st Mrg to 1st Mrg lesion, 35 %stenosed.  2nd Mrg lesion, 90 %stenosed.  A STENT PROMUS PREM MR 2.5X24 drug eluting stent was successfully placed.  Post intervention, there is a 0% residual stenosis.  Prox RCA to Mid RCA lesion, 50 %stenosed.  RPDA lesion, 100 %stenosed.  1. Diffuse coronary artery disease with relatively small diabetic vessels.  2. Severe segmental stenosis in the second OM. Lesion is hazy and ulcerative. This is the culprit lesion 3. Severely elevated LVEDP 4. Successful stenting of the second OM with DES.  Plan: DAPT for one year. Aggressive risk factor modification. Will treat with IV lasix for elevated LVEDP. Add ARB, beta blocker as tolerated. Assess LV function with Echo. Trend serial troponins and ECG.      Cath 05/24/16  Ost Cx to Mid Cx lesion, 70 %stenosed leading into OM2 as the main trunk of the Circumflex.  Ost 2nd Mrg to 2nd Mrg recent Promus DES 2.5 x 24 stent, - focal 90  %stenosed with what appears to be proximal edge dissection with thrombus  A STENT PROMUS PREM MR 2.5X20 drug eluting stent was successfully placed from Ostium of Circumflex into OM2, and overlaps previously  placed stent.  Post intervention, there is a 0% residual stenosis.  ____________________________________________________  Suezanne Jacquet LAD to Prox LAD lesion, 30 %stenosed.  Ost 1st Diag to 1st Diag lesion, 70 %stenosed.  Ost 1st Mrg to 1st Mrg lesion, 35 %stenosed. Ost 2nd Diag to 2nd Diag lesion, 50 %stenosed.  Prox RCA to Mid RCA lesion, 65 %stenosed - lesion appears similar to prior cath, with mild progression.  Very distal RPDA lesion, 100 %stenosed.  LV end diastolic pressure is severely elevated.  There is no aortic valve stenosis.   Status post what is most likely VF or VT arrest 2 weeks post STEMI with PCI to the circumflex.  His EKG itself did not show signs of ST elevation, however given his recent STEMI, known cardiac myopathy and presentation with cardiac arrest, I felt it prudent taken to the Cath Lab.  Angiographically there appeared to be a possible proximal edge dissection with thrombus in this lesion, however there is TIMI 2 flow. This lesion was treated with an overlapping proximal stent up to the ostium of the circumflex and OM 2. Angiographically there was good result. It is quite possible at least 1 and strut is into the left main, but non flow-limiting.  Plan:  He'll be admitted to the CCU on hypothermia protocol per PCCM  Continue amiodarone overnight as he appears to have converted into sinus rhythm  Hold ARB and Imdur for now. We'll try to start beta blocker.  Cardiology will follow along while on cooling protocol.   ECHO 05/15/16 - Left ventricle: The cavity size was moderately dilated. Wall thickness was normal. Systolic function was severely reduced. The estimated ejection fraction was in the range of 25% to 30%. Akinesis and scarring of the  anteroseptal and anterior myocardium; consistent with infarction in the distribution of the left anterior descending coronary artery. Dyskinesis of the apicalanterior myocardium. Features are consistent with a pseudonormal left ventricular filling pattern, with concomitant abnormal relaxation and increased filling pressure (grade 2 diastolic dysfunction). No evidence of thrombus. - Left atrium: The atrium was mildly dilated. - Atrial septum: No defect or patent foramen ovale was identified.    Patient Profile     51 y.o. 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 Re-intubated today, minimal neurologic recovery.   Assessment & Plan    Cardiac arrest: found by family pulseless, shaking. Felt 2/2 to VT/VF arrest, shocked by AED on the field. With recent STEMI, Dilated CM and presentation with cardiac arrest, he was taken back for cath. This showed possible edge dissection in circ, treated with an overlapping DES. He was placed on cooling protocol in the cath lab. Amiodarone DCed and no significant arrhythmias in last 48 hrs. I would continue coreq only.   CAD: Status post PCI of circumflex. Continue aspirin, statin and brilinta.  Anoxic brain injury: neurology following; some improvement today. Neurology now feels his prognosis is better.  Chronic systolic HF/ischemic cardiomyopathy: Continue coreg; held lisinopril 2.5 mg daily with reintubation, as he is hypotensive. will need life vest at DC and reassessment of LV function 3 months later; at that point if EF < 35 would need ICD.    NSVT: No recent NSVT.  Hyponatremia: on D5W. Follow   Tobias Alexander, MD 06/02/2016

## 2016-06-02 NOTE — Progress Notes (Signed)
SLP Cancellation Note  Patient Details Name: Douglas Carr MRN: [id number] DOB: Apr 16, 1966   Cancelled treatment:       Reason Eval/Treat Not Completed: Patient not medically ready. No alert, has NG. Will f/u.    [name], [name] 06/02/2016, 9:44 AM

## 2016-06-02 NOTE — Progress Notes (Signed)
Pt having difficultly clearing thick, mucus secretions. RN suctioning pt frequently to maintain pt airway. ELink MD notified and MD at beside. Pt maintaining O2 stat 96% and RR 20, RN concerned that pt airway will need to be addressed by morning rounding team if airway secretions continue.

## 2016-06-02 NOTE — Progress Notes (Signed)
Patient transported on vent from MRI to 4N-21 without complication.

## 2016-06-02 NOTE — Progress Notes (Signed)
EEG completed, results pending. 

## 2016-06-02 NOTE — Progress Notes (Signed)
Nutrition Follow-up  DOCUMENTATION CODES:   Obesity unspecified  INTERVENTION:   Recommend:  Vital High Protein @ 20 ml/hr (480 ml/day) 60 ml Prostat QID Provides: 1280 kcal, 162 grams protein, and 401 ml H2O.   NUTRITION DIAGNOSIS:   Inadequate oral intake related to inability to eat as evidenced by NPO status. Ongoing.   GOAL:   Provide needs based on ASPEN/SCCM guidelines Not met.   MONITOR:   TF tolerance, I & O's, Vent status  REASON FOR ASSESSMENT:   Consult Enteral/tube feeding initiation and management  ASSESSMENT:   51 yo male with PMH of CAD(recently admitted from 1/3-1/38fr MI, had PCI of Om2), HFrEF (EF 35%), DMII (on insulin), HLD and carotid disease. Pt admitted with OOH arrest, now with onset seizures.   1/28 extubated 1/29 re-intubated however no plan for trach/PEG/SNF  Na 150   Diet Order:    NPO  Skin:  Reviewed, no issues  Last BM:  1/29  Height:   Ht Readings from Last 1 Encounters:  05/24/16 5' 9"  (1.753 m)    Weight:   Wt Readings from Last 1 Encounters:  06/02/16 219 lb 12.8 oz (99.7 kg)    Ideal Body Weight:  72.7 kg  BMI:  Body mass index is 32.46 kg/m.  Estimated Nutritional Needs:   Kcal:  17255-0016 Protein:  >145 grams  Fluid:  > 1.5 L/day  EDUCATION NEEDS:   No education needs identified at this time  [place] CLakotaPager [phone] Hours Pager

## 2016-06-02 NOTE — Progress Notes (Signed)
Pt intubated by MD.  ET tube secured with ETAD at 27 @ lip.  Equal BBS and positive CO2 color change.  Pt placed on vent with settings per MD.  RT will continue to monitor.

## 2016-06-02 NOTE — Procedures (Signed)
ELECTROENCEPHALOGRAM REPORT  Date of Study: 06/02/2016  Patient's Name: [name] MRN: [id number] Date of Birth: 04-Nov-1965  Referring Provider: Dr. Ritta SlotMcNeill Kirkpatrick  Clinical History: This is a 51 year old man s/p cardiac arrest, reintubated today.  Medications: levETIRAcetam (KEPPRA) 1,000 mg in sodium chloride 0.9 % 100 mL IVPB  dexmedetomidine (PRECEDEX) 200 MCG/50ML (4 mcg/mL) infusion  fentaNYL (SUBLIMAZE) 2,500 mcg in sodium chloride 0.9 % 250 mL (10 mcg/mL) infusion  acetaminophen (TYLENOL) tablet 650 mg  aspirin chewable tablet 81 mg  atorvastatin (LIPITOR) tablet 80 mg  bisacodyl (DULCOLAX) suppository 10 mg  carvedilol (COREG) tablet 12.5 mg  docusate (COLACE) 50 MG/5ML liquid 100 mg  famotidine (PEPCID) 40 MG/5ML suspension 20 mg  heparin injection 5,000 Units  insulin aspart (novoLOG) injection 0-15 Units  labetalol (NORMODYNE,TRANDATE) injection 10 mg  lisinopril (PRINIVIL,ZESTRIL) tablet 2.5 mg  norepinephrine (LEVOPHED) 4 mg in dextrose 5 % 250 mL (0.016 mg/mL) infusion  ondansetron (ZOFRAN) injection 4 mg  piperacillin-tazobactam (ZOSYN) IVPB 3.375 g  sennosides (SENOKOT) 8.8 MG/5ML syrup 5 mL  ticagrelor (BRILINTA) tablet 90 mg  vancomycin (VANCOCIN) IVPB 1000 mg/200 mL premix   Technical Summary: A multichannel digital EEG recording measured by the international 10-20 system with electrodes applied with paste and impedances below 5000 ohms performed as portable with EKG monitoring in an intubated and unresponsive patient. Patient received a bolus of Fentanyl and increase in Precedex due to agitation with stimulation.  Hyperventilation and photic stimulation were not performed.  The digital EEG was referentially recorded, reformatted, and digitally filtered in a variety of bipolar and referential montages for optimal display.   Description: The patient is intubated and sedated during the recording. There is no clear posterior dominant rhythm. The  background consists of a large amount of diffuse low voltage beta activity intermixed with diffuse theta and delta slowing. Normal sleep architecture is not seen. Patient not stimulation due to agitation. There were no epileptiform discharges or electrographic seizures seen.    EKG lead was unremarkable.  Impression: This sedated EEG is abnormal due to moderate diffuse slowing of the background.  Clinical Correlation of the above findings indicates diffuse cerebral dysfunction that is non-specific in etiology and can be seen with hypoxic/ischemic injury, toxic/metabolic encephalopathies,  or medication effect from Precedex and Fentanyl.  There were no electrographic seizures seen in this study.  Clinical correlation is advised.   Patrcia DollyKaren Ameri Cahoon, M.D.

## 2016-06-02 NOTE — Progress Notes (Signed)
Pharmacy Antibiotic Note  Douglas SermonsClifford M Carr is a 51 y.o. male admitted on 05/24/2016 with STEMI. Pt was febrile overnight without a clear source and d/t length of stay plus intubation vancomycin and zosyn have been started to empirically treat for HAP.  Pharmacy has been consulted for vancomycin and zosyn dosing.  Plan: Vancomycin 1000 mg IV x1 Vancomycin 1000 mg q8h Zosyn 3.375 g IV q8h Monitor renal function and s/sx clinical imporvement VT as appropriate De-escalate abx as appropriate  Height: 5\' 9"  (175.3 cm) Weight: 219 lb 12.8 oz (99.7 kg) IBW/kg (Calculated) : 70.7  Temp (24hrs), Avg:99.2 F (37.3 C), Min:97.8 F (36.6 C), Max:100.7 F (38.2 C)   Recent Labs Lab 05/28/16 0430 05/29/16 0925  05/30/16 0503 05/31/16 0443 05/31/16 1652 06/01/16 0219 06/02/16 0227  WBC 13.2* 11.5*  --  10.6*  --   --  12.8* 12.2*  CREATININE 1.20 1.05  < > 1.12 1.08 0.94 0.94 1.05  < > = values in this interval not displayed.  Estimated Creatinine Clearance: 99.1 mL/min (by C-G formula based on SCr of 1.05 mg/dL).  Clearance stable  No Known Allergies  Antimicrobials this admission: Unasyn 1/21 >> 1/23 Vancomycin 1/29 >> Zosyn 1/29 >>  Dose adjustments this admission: N/A  Microbiology results: 1/27 BCx: NGTD 1/28 Sputum: Pending 1/20 MRSA PCR: Neg  Thank you for allowing pharmacy to be a part of this patient's care.  Douglas CureJustin R Walid Carr, PharmD Candidate 06/02/2016 11:02 AM

## 2016-06-02 NOTE — Consult Note (Signed)
Physical Medicine and Rehabilitation Consult    Reason for Consult: Anoxic brain injury Referring Physician: Dr. Herbie Baltimore   HPI: Douglas Carr is a 51 y.o. male with history of T2DM,  diffuse CAD s/p PCI to circumflex on 05/07/2016, acute on chronic combined CHF who was admitted on 05/24/16 due to cardiac arrest. CPR initiated by family and pulses returned with one shock by AED. He was intubated in ED and EKG with STEMI and A fib with RVR. He was started on amiodarone and started on Artic sun protocol.  He underwent cardiac cath revealing proximal edge dissection with thrombus treated with overlapping stent with good results. Patient unresponsive and noted to have seizures therefore loaded with Keppra and started on AEDs. EEG with moderate to severe generalized slowing and no epileptiform activity. Question of myoclonus type activity BLE and keppra increased to 1000 mg bid. with MRI brain done 01/24 revealing possible acute infarct left thalamus and mild chronic ischemic changes.  Neurology recommends avoiding hypotension and aggressive treatment of fever/hyperglycemia to help with recovery.   He did have episode of emesis with fever and was started on IV antibiotics for possible aspiration PNA.  Mentation improving and he was extubated yesterday but continues to have difficulty clearing thick foul smelling secretions. Lack of gag reflex on suctioning per RT. He required reintubation today.    Patient with poor tolerance of extubation with increased WOB and agitation with suctioning attempts.    Review of Systems  Unable to perform ROS: Intubated      Past Medical History:  Diagnosis Date  . Acute ST elevation myocardial infarction (STEMI) involving left circumflex coronary artery (HCC) 05/07/2016   PCI to Cx-OM  . Anginal pain (HCC)    secondary to sm. vessel disease  . Anxiety   . Arthritis    BIL KNEE PAIN AND BIL ANKLE PAIN  . Bone spur of ankle   . Chronic combined  systolic and diastolic CHF, NYHA class 2 and ACA/AHA stage C (HCC) 05/13/2016  . Coronary artery disease involving native coronary artery of native heart with angina pectoris (HCC) 05/24/2016   Remote MI at 51 years of age, Last cath 2010-diffuse non-obstructive disease; last echo 06/16/08 -normal LV function, moderate concentric hypertrophy; nuc 08/2008 no ischemia;  medical therapy; STEMI May 07 2016 - PCI to Cx-OM  . Diabetes mellitus    ON ORAL MEDICATION AND INSULIN  . Dilated cardiomyopathy (HCC) 05/24/2016   EF 325-30% by Echo post STEMI (previously 30-35%)   . Hyperlipidemia   . Hypertension   . Myocardial infarction 1996   Post MI  . Peripheral vascular disease (HCC)    HAS LEFT CAROTID ARTERY STENOSIS   AND IS S/P RIGHT CAROTID ENDARTERECTOMY 2010 Last carotid dopplers 01/08/2012 wth patent endarterectomy site  . Status post coronary artery stent placement     Past Surgical History:  Procedure Laterality Date  . APPENDECTOMY    . CARDIAC CATHETERIZATION     FEB 2010, significant branch vessel disease wth diag, marginal, PDA & PLA, nml. LV function  . CARDIAC CATHETERIZATION N/A 05/07/2016   Procedure: Left Heart Cath and Coronary Angiography;  Surgeon: Peter M Swaziland, MD;  Location[facility] INVASIVE CV LAB;  Service: Cardiovascular;  Laterality: N/A;  . CARDIAC CATHETERIZATION N/A 05/07/2016   Procedure: Coronary Stent Intervention;  Surgeon: Peter M Swaziland, MD;  Location: [facility] INVASIVE CV LAB;  Service: Cardiovascular;  Laterality: N/A;  . CARDIAC CATHETERIZATION N/A 05/24/2016   Procedure:  Left Heart Cath and Coronary Angiography;  Surgeon: Marykay Lex, MD;  Location: [facility] INVASIVE CV LAB;  Service: Cardiovascular;  Laterality: N/A;  . CARDIAC CATHETERIZATION N/A 05/24/2016   Procedure: Coronary Stent Intervention;  Surgeon: Marykay Lex, MD;  Location: [facility] INVASIVE CV LAB;  Service: Cardiovascular;  Laterality: N/A;  2.5x20 Promus to Ostial/proximal circumflex  . CAROTID ENDARTERECTOMY   09/2008   Rt CEA  . LUNG BIOPSY  2013   Bx's suggest granulomatous dz.  . RT ANKLE   2013    Family History  Problem Relation Age of Onset  . Hypertension Mother   . Diabetes Mother   . Hypertension Maternal Grandfather   . Heart attack Paternal Grandfather   . Heart attack Father 61    Social History:  Married. Per reports that he quit smoking about 4 years ago. His smoking use included Cigarettes. He has a 44.00 pack-year smoking history. He has never used smokeless tobacco. Per reports that he drinks alcohol. Per reports that he does not use drugs.    Allergies: No Known Allergies    Medications Prior to Admission  Medication Sig Dispense Refill  . aspirin 81 MG chewable tablet Chew 1 tablet (81 mg total) by mouth daily.    Marland Kitchen atorvastatin (LIPITOR) 80 MG tablet Take 1 tablet (80 mg total) by mouth daily at 6 PM. 90 tablet 3  . carvedilol (COREG) 6.25 MG tablet Take 1 tablet (6.25 mg total) by mouth 2 (two) times daily with a meal. 180 tablet 3  . insulin aspart (NOVOLOG FLEXPEN) 100 UNIT/ML FlexPen Inject 6 to 10 units 3 times a day prior to each meal and as needed for blood sugar greater than 200 (Patient taking differently: Inject 0-10 Units into the skin See admin instructions. Three times a day prior to each meal as needed for blood sugar greater than 200) 15 pen 3  . Insulin Degludec (TRESIBA FLEXTOUCH) 200 UNIT/ML SOPN Inject 160 Units into the skin daily. 27 mL 5  . isosorbide mononitrate (IMDUR) 30 MG 24 hr tablet Take 1 tablet (30 mg total) by mouth daily. 180 tablet 3  . losartan (COZAAR) 100 MG tablet Take 1 tablet (100 mg total) by mouth daily. 90 tablet 3  . nitroGLYCERIN (NITROSTAT) 0.4 MG SL tablet Place 1 tablet (0.4 mg total) under the tongue every 5 (five) minutes x 3 doses as needed for chest pain. 25 tablet 2  . potassium chloride SA (K-DUR,KLOR-CON) 20 MEQ tablet Take 1 tablet (20 mEq total) by mouth daily. 90 tablet 3  . spironolactone (ALDACTONE) 25 MG tablet  Take 0.5 tablets (12.5 mg total) by mouth daily. 90 tablet 3  . ticagrelor (BRILINTA) 90 MG TABS tablet Take 1 tablet (90 mg total) by mouth 2 (two) times daily. 180 tablet 3  . torsemide (DEMADEX) 20 MG tablet Take 1 tablet (20 mg total) by mouth 2 (two) times daily. 60 tablet 3  . acetaminophen (TYLENOL) 325 MG tablet Take 2 tablets (650 mg total) by mouth every 4 (four) hours as needed for headache or mild pain. (Patient not taking: Reported on 05/24/2016)      Home:    Functional History:   Functional Status:  Mobility:          ADL:    Cognition: Cognition Orientation Level: Oriented to person    Blood pressure (!) 130/105, pulse 82, temperature 98.4 F (36.9 C), temperature source Oral, resp. rate (!) 23, height 5\' 9"  (1.753 m), weight 99.7  kg (219 lb 12.8 oz), SpO2 97 %. Physical Exam  Vitals reviewed. Constitutional: He appears well-developed.  Obese  HENT:  Head: Normocephalic and atraumatic.  Eyes: Conjunctivae are normal. Right eye exhibits no discharge. Left eye exhibits no discharge.  Neck:  Intubated on Vent  Cardiovascular: Regular rhythm.   Respiratory:  +Vent +Stridor  GI: Soft. There is no tenderness.  Musculoskeletal: He exhibits edema.  Does not appear to have tenderness  Neurological: He is alert.  Unable to assess MMT and sensation due to sedation DTRs symmetric  Skin: Skin is warm and dry.  Psychiatric:  Unable to assess due to sedation    Results for orders placed or performed during the hospital encounter of 05/24/16 (from the past 24 hour(s))  Glucose, capillary     Status: Abnormal   Collection Time: 06/01/16 11:59 AM  Result Value Ref Range   Glucose-Capillary 192 (H) 65 - 99 mg/dL  Glucose, capillary     Status: Abnormal   Collection Time: 06/01/16  3:26 PM  Result Value Ref Range   Glucose-Capillary 127 (H) 65 - 99 mg/dL  Urinalysis, Routine w reflex microscopic     Status: Abnormal   Collection Time: 06/01/16  5:15 PM  Result  Value Ref Range   Color, Urine YELLOW YELLOW   APPearance HAZY (A) CLEAR   Specific Gravity, Urine 1.023 1.005 - 1.030   pH 5.0 5.0 - 8.0   Glucose, UA NEGATIVE NEGATIVE mg/dL   Hgb urine dipstick LARGE (A) NEGATIVE   Bilirubin Urine NEGATIVE NEGATIVE   Ketones, ur NEGATIVE NEGATIVE mg/dL   Protein, ur 30 (A) NEGATIVE mg/dL   Nitrite NEGATIVE NEGATIVE   Leukocytes, UA MODERATE (A) NEGATIVE   RBC / HPF TOO NUMEROUS TO COUNT 0 - 5 RBC/hpf   WBC, UA 6-30 0 - 5 WBC/hpf   Bacteria, UA RARE (A) NONE SEEN   Squamous Epithelial / LPF 0-5 (A) NONE SEEN   WBC Clumps PRESENT    Mucous PRESENT   Glucose, capillary     Status: Abnormal   Collection Time: 06/01/16  9:00 PM  Result Value Ref Range   Glucose-Capillary 117 (H) 65 - 99 mg/dL  Glucose, capillary     Status: Abnormal   Collection Time: 06/02/16 12:06 AM  Result Value Ref Range   Glucose-Capillary 133 (H) 65 - 99 mg/dL  CBC     Status: Abnormal   Collection Time: 06/02/16  2:27 AM  Result Value Ref Range   WBC 12.2 (H) 4.0 - 10.5 K/uL   RBC 3.90 (L) 4.22 - 5.81 MIL/uL   Hemoglobin 10.8 (L) 13.0 - 17.0 g/dL   HCT 16.1 (L) 09.6 - 04.5 %   MCV 90.0 78.0 - 100.0 fL   MCH 27.7 26.0 - 34.0 pg   MCHC 30.8 30.0 - 36.0 g/dL   RDW 40.9 81.1 - 91.4 %   Platelets 164 150 - 400 K/uL  Basic metabolic panel     Status: Abnormal   Collection Time: 06/02/16  2:27 AM  Result Value Ref Range   Sodium 150 (H) 135 - 145 mmol/L   Potassium 3.7 3.5 - 5.1 mmol/L   Chloride 115 (H) 101 - 111 mmol/L   CO2 26 22 - 32 mmol/L   Glucose, Bld 142 (H) 65 - 99 mg/dL   BUN 32 (H) 6 - 20 mg/dL   Creatinine, Ser 7.82 0.61 - 1.24 mg/dL   Calcium 8.5 (L) 8.9 - 10.3 mg/dL   GFR calc non  Af Amer >60 >60 mL/min   GFR calc Af Amer >60 >60 mL/min   Anion gap 9 5 - 15  Glucose, capillary     Status: Abnormal   Collection Time: 06/02/16  4:32 AM  Result Value Ref Range   Glucose-Capillary 133 (H) 65 - 99 mg/dL  Glucose, capillary     Status: Abnormal    Collection Time: 06/02/16  7:59 AM  Result Value Ref Range   Glucose-Capillary 138 (H) 65 - 99 mg/dL   Dg Abd Portable 1v  Result Date: 06/01/2016 CLINICAL DATA:  Nasogastric tube was advanced. EXAM: PORTABLE ABDOMEN - 1 VIEW COMPARISON:  Earlier this day at 2252 hours FINDINGS: Tip of the enteric tube in the right upper quadrant of the abdomen, possible stomach or proximal duodenum. Side port of the enteric tube in the stomach. Nonobstructive bowel gas pattern in the visualized upper abdomen. IMPRESSION: Enteric tube in the stomach, tip either in the distal stomach or proximal duodenum. Electronically Signed   By: Rubye Oaks M.D.   On: 06/01/2016 23:32   Dg Abd Portable 1v  Result Date: 06/01/2016 CLINICAL DATA:  Initial evaluation for NG tube placement. EXAM: PORTABLE ABDOMEN - 1 VIEW COMPARISON:  Prior radiograph from 05/27/2016. FINDINGS: NG tube in place. Tip appears to lie at or just above the GE junction, with side hole likely an knee esophagus. Advancement by approximately 7 cm is suggested. Visualized bowel gas pattern within normal limits. IMPRESSION: Tip of NG tube at or just above the GE junction. Advancement by approximately 7 cm is suggested. Electronically Signed   By: Rise Mu M.D.   On: 06/01/2016 23:17    Assessment/Plan: Diagnosis: Anoxic brain injury Labs and images independently reviewed.  Records reviewed and summated above.  1. Does the need for close, 24 hr/day medical supervision in concert with the patient's rehab needs make it unreasonable for this patient to be served in a less intensive setting? Potentially  2. Co-Morbidities requiring supervision/potential complications: T2 DM (Monitor in accordance with exercise and adjust meds as necessary),  diffuse CAD with STEMI s/p PCI to circumflex on 05/07/2016 (cont meds), acute on chronic combined CHF (Monitor in accordance with increased physical activity and avoid UE resistance excercises), seizures (cont  meds), A fib with RVR (monitor HR with increased activity), likely acute infarct left thalamus, aspiration PNA (cont IV Vanc, narrow as appropriate), Pyrexia (cont to monitor for signs and symptoms of infection, further workup if indicated), hypernatremia (cont to monitor, treat if necessary), leukocytosis (cont to monitor for signs and symptoms of infection, further workup if indicated), ABLA (transfuse if necessary to ensure appropriate perfusion for increased activity tolerance) 3. Due to bladder management, bowel management, safety, skin/wound care, disease management, medication administration, pain management and patient education, does the patient require 24 hr/day rehab nursing? Yes 4. Does the patient require coordinated care of a physician, rehab nurse, PT (1-2 hrs/day, 5 days/week), OT (1-2 hrs/day, 5 days/week) and SLP (1-2 hrs/day, 5 days/week) to address physical and functional deficits in the context of the above medical diagnosis(es)? Potentially Addressing deficits in the following areas: balance, endurance, locomotion, strength, transferring, bowel/bladder control, bathing, dressing, feeding, grooming, toileting, cognition, speech, language, swallowing and psychosocial support 5. Can the patient actively participate in an intensive therapy program of at least 3 hrs of therapy per day at least 5 days per week? No 6. The potential for patient to make measurable gains while on inpatient rehab is fair 7. Anticipated functional outcomes upon discharge  from inpatient rehab are TBD  with PT, TBD with OT, TBD with SLP. 8. Estimated rehab length of stay to reach the above functional goals is: TBD 9. Does the patient have adequate social supports and living environment to accommodate these discharge functional goals? Potentially 10. Anticipated D/C setting: TBD 11. Anticipated post D/C treatments: TBD 12. Overall Rehab/Functional Prognosis: fair and poor  RECOMMENDATIONS: This patient's  condition is appropriate for continued rehabilitative care in the following setting: Pt unable to tolerate 3 hours of therapy at present and not medically stable.  Difficult to assess potential functional gains due to the fact that pt is sedated and has not participated in therapies.  Once sedation weaned and evaluated, would have a better idea. Patient has agreed to participate in recommended program. Potentially Note that insurance prior authorization may be required for reimbursement for recommended care.  Comment: Rehab Admissions Coordinator to follow up.  Maryla MorrowAnkit Patel, MD, 9988 Heritage DriveFAAPMR Love, Pamela S, [place] JerseyPA-C 06/02/2016

## 2016-06-02 NOTE — Progress Notes (Signed)
Pt was orally suctioned. Pt is unable to protect his airway. Pt has NO GAG REFLEX. Got back Copious amount of brown/dark tan secretions. Pt is maintaining his SATs. Pt secretions are extremely Fetid and foul-smelling. RN aware.

## 2016-06-02 NOTE — Progress Notes (Signed)
PULMONARY / CRITICAL CARE MEDICINE   ADMISSION DATE:  05/24/2016 CONSULTATION DATE:  05/24/2016  REFERRING MD :  Dr. Herbie Baltimore  CHIEF COMPLAINT:  STEMI/Post arrest  BRIEF:  51 y/o male with known CAP and systolic CHF admitted on 1/20 with STEMI, cardiac arrest.  Has remained intubated since admission.  PCI placed to circ on admission.   SUBJECTIVE:  Extubated yesterday. Noted to have brown/dark tan, foul-smelling secretions last night. Maintaining sats. Will look at me and follow commands, has not verbalized yet. Fever 100.7 overnight.  VITAL SIGNS: Temp:  [97.8 F (36.6 C)-100.7 F (38.2 C)] 98.4 F (36.9 C) (01/29 0757) Pulse Rate:  [76-87] 85 (01/29 0900) Resp:  [19-33] 24 (01/29 0900) BP: (70-137)/(56-117) 128/98 (01/29 0900) SpO2:  [86 %-100 %] 86 % (01/29 0900) FiO2 (%):  [40 %] 40 % (01/28 1129) Weight:  [99.7 kg (219 lb 12.8 oz)] 99.7 kg (219 lb 12.8 oz) (01/29 0429) HEMODYNAMICS:   VENTILATOR SETTINGS: Vent Mode: PSV;CPAP FiO2 (%):  [40 %] 40 % PEEP:  [5 cmH20] 5 cmH20 Pressure Support:  [5 cmH20] 5 cmH20 INTAKE / OUTPUT:  Intake/Output Summary (Last 24 hours) at 06/02/16 1016 Last data filed at 06/02/16 0600  Gross per 24 hour  Intake           1107.5 ml  Output             1500 ml  Net           -392.5 ml    PHYSICAL EXAMINATION: General: Middle aged man resting in bed HENT: /AT, EOMI, PERRL PULM: CTA bilaterally CV: RRR, no m/g/r GI: BS+, soft, nontender MSK: moves all extremities  Neuro: awake, follows commands    LABS:  CBC  Recent Labs Lab 05/30/16 0503 06/01/16 0219 06/02/16 0227  WBC 10.6* 12.8* 12.2*  HGB 10.8* 10.9* 10.8*  HCT 34.8* 35.2* 35.1*  PLT 171 167 164   Coag's No results for input(s): APTT, INR in the last 168 hours. BMET  Recent Labs Lab 05/31/16 1652 06/01/16 0219 06/02/16 0227  NA 151* 151* 150*  K 3.7 3.6 3.7  CL 115* 114* 115*  CO2 28 28 26   BUN 34* 37* 32*  CREATININE 0.94 0.94 1.05  GLUCOSE 212* 219*  142*   Electrolytes  Recent Labs Lab 05/27/16 0331 05/27/16 1701  05/31/16 0443 05/31/16 1652 06/01/16 0219 06/02/16 0227  CALCIUM 8.4*  --   < > 8.5* 8.7* 8.5* 8.5*  MG 2.3 2.4  --  2.7*  --   --   --   PHOS 3.0 2.9  --  4.3  --   --   --   < > = values in this interval not displayed. Sepsis Markers  Recent Labs Lab 05/27/16 0331 05/28/16 0430  PROCALCITON <0.10 <0.10   ABG  Recent Labs Lab 05/27/16 1354  PHART 7.397  PCO2ART 50.8*  PO2ART 107.0   Liver Enzymes  Recent Labs Lab 05/27/16 0331  AST 33  ALT 84*  ALKPHOS 72  BILITOT 0.8  ALBUMIN 2.8*   Cardiac Enzymes No results for input(s): TROPONINI, PROBNP in the last 168 hours. Glucose  Recent Labs Lab 06/01/16 1159 06/01/16 1526 06/01/16 2100 06/02/16 0006 06/02/16 0432 06/02/16 0759  GLUCAP 192* 127* 117* 133* 133* 138*    Imaging Dg Abd Portable 1v  Result Date: 06/01/2016 CLINICAL DATA:  Nasogastric tube was advanced. EXAM: PORTABLE ABDOMEN - 1 VIEW COMPARISON:  Earlier this day at 2252 hours FINDINGS: Tip of the  enteric tube in the right upper quadrant of the abdomen, possible stomach or proximal duodenum. Side port of the enteric tube in the stomach. Nonobstructive bowel gas pattern in the visualized upper abdomen. IMPRESSION: Enteric tube in the stomach, tip either in the distal stomach or proximal duodenum. Electronically Signed   By: Rubye OaksMelanie  Ehinger M.D.   On: 06/01/2016 23:32   Dg Abd Portable 1v  Result Date: 06/01/2016 CLINICAL DATA:  Initial evaluation for NG tube placement. EXAM: PORTABLE ABDOMEN - 1 VIEW COMPARISON:  Prior radiograph from 05/27/2016. FINDINGS: NG tube in place. Tip appears to lie at or just above the GE junction, with side hole likely an knee esophagus. Advancement by approximately 7 cm is suggested. Visualized bowel gas pattern within normal limits. IMPRESSION: Tip of NG tube at or just above the GE junction. Advancement by approximately 7 cm is suggested.  Electronically Signed   By: Rise MuBenjamin  McClintock M.D.   On: 06/01/2016 23:17   Lines/Tubes: ETT 1/20 >>1/28 L IJ CVL 1/20 >>1/28  Cultures: Resp 1/28>>  Blood 1/27>>  Antibiotics: Unasyn 1/21>>>1/23 Vanc 1/29>>> Zosyn 1/29>>>  Studies: CT Head 1/20 >> negative CXR 1/20 >> Endotracheal tube in satisfactory position. Worsening bilateral interstitial opacities which may reflect edema or infection. CXR 1/21 >> Tip of the left central line in the proximal SVC. No Pneumothorax. Developing hazy opacity at the right lung base, likely pleural fluid. Worsening interstitial opacities may be increasing pulmonary edema, pneumonia, or ARDS. EEG 1/20 >> moderate to severe generalized slowing.  No epileptiform discharges or seizures.  Improved some relative to burst suppression pattern seen yesterday.  MRI 1/24>> Possible 4 mm acute infarct left thalamus. No other acute infarct. Mild chronic ischemia of the convexity bilaterally right greater than left. EEG 1/25>>mild to moderate diffuse generalized slowing, improved somewhat with sedation discontinued. Episodes of twitching on right side recorded but did not show seizure activity.   ASSESSMENT / PLAN:  PULMONARY A: Acute respiratory failure with hypoxemia Chronic R side pulmonary nodule Extubated 1/28 P:   Reintubate No trach/peg/SNF Titrate O2 for sat of 88-92%  CARDIOVASCULAR A:  Cardiac arrest STEMI, s/p PCI to circumflex Systolic CHF Hypertension NSVT this admission P:  Anti-platelets, statin, B-blocker per cardiology Tele Lisinopril  RENAL A:   AKI resolved Hypokalemia- resolved Hypernatremia P:   KVO IVF Monitor BMET and UOP Replace electrolytes as needed  GASTROINTESTINAL A:   Nutrition needs Stress ulcer prophylaxis P:   Resume TF NPO for now Continue famotidine  HEMATOLOGIC A:   No acute issues P:  Continue sub q heparin   INFECTIOUS A:   Continued mild fevers- no clear source P:   CVL removed  1/27 Follow cultures Monitor fever curve Vanc/zosyn per pharmacy  ENDOCRINE A:   DM2  hyperglycemia P:   Continue SSI  NEUROLOGIC A:   Acute encephalopathy> slow improvement, following commands Seizures 1/20  P:   Continue Keppra Control blood sugar, keep normothermic as able  Spoke with wife and son, will reintubate but no trach/peg/SNF, full code status for now, allow for a couple of days then one way extubation is more likely than not.  The patient is critically ill with multiple organ systems failure and requires high complexity decision making for assessment and support, frequent evaluation and titration of therapies, application of advanced monitoring technologies and extensive interpretation of multiple databases.   Critical Care Time devoted to patient care services described in this note is  35  Minutes. This time reflects time of  care of this signee Dr Jennet Maduro. This critical care time does not reflect procedure time, or teaching time or supervisory time of PA/NP/Med student/Med Resident etc but could involve care discussion time.  Rush Farmer, M.D. [facility] Pulmonary/Critical Care Medicine. Pager: [phone]. After hours pager: [phone].

## 2016-06-03 ENCOUNTER — Inpatient Hospital Stay (HOSPITAL_COMMUNITY): Payer: BC Managed Care – PPO

## 2016-06-03 LAB — POCT I-STAT 3, ART BLOOD GAS (G3+)
Acid-Base Excess: 4 mmol/L — ABNORMAL HIGH (ref 0.0–2.0)
BICARBONATE: 27.3 mmol/L (ref 20.0–28.0)
O2 SAT: 85 %
PH ART: 7.476 — AB (ref 7.350–7.450)
TCO2: 28 mmol/L (ref 0–100)
pCO2 arterial: 37.4 mmHg (ref 32.0–48.0)
pO2, Arterial: 50 mmHg — ABNORMAL LOW (ref 83.0–108.0)

## 2016-06-03 LAB — CBC
HCT: 35.2 % — ABNORMAL LOW (ref 39.0–52.0)
HEMOGLOBIN: 10.7 g/dL — AB (ref 13.0–17.0)
MCH: 27.5 pg (ref 26.0–34.0)
MCHC: 30.4 g/dL (ref 30.0–36.0)
MCV: 90.5 fL (ref 78.0–100.0)
Platelets: 194 10*3/uL (ref 150–400)
RBC: 3.89 MIL/uL — ABNORMAL LOW (ref 4.22–5.81)
RDW: 13.4 % (ref 11.5–15.5)
WBC: 13.9 10*3/uL — ABNORMAL HIGH (ref 4.0–10.5)

## 2016-06-03 LAB — GLUCOSE, CAPILLARY
GLUCOSE-CAPILLARY: 143 mg/dL — AB (ref 65–99)
GLUCOSE-CAPILLARY: 146 mg/dL — AB (ref 65–99)
GLUCOSE-CAPILLARY: 159 mg/dL — AB (ref 65–99)
GLUCOSE-CAPILLARY: 166 mg/dL — AB (ref 65–99)
Glucose-Capillary: 152 mg/dL — ABNORMAL HIGH (ref 65–99)
Glucose-Capillary: 155 mg/dL — ABNORMAL HIGH (ref 65–99)
Glucose-Capillary: 162 mg/dL — ABNORMAL HIGH (ref 65–99)
Glucose-Capillary: 164 mg/dL — ABNORMAL HIGH (ref 65–99)

## 2016-06-03 LAB — CULTURE, RESPIRATORY

## 2016-06-03 LAB — BASIC METABOLIC PANEL
ANION GAP: 7 (ref 5–15)
BUN: 31 mg/dL — AB (ref 6–20)
CALCIUM: 8.4 mg/dL — AB (ref 8.9–10.3)
CO2: 28 mmol/L (ref 22–32)
CREATININE: 1.02 mg/dL (ref 0.61–1.24)
Chloride: 117 mmol/L — ABNORMAL HIGH (ref 101–111)
GFR calc Af Amer: >60 mL/min (ref >60)
GFR calc non Af Amer: >60 mL/min (ref >60)
GLUCOSE: 173 mg/dL — AB (ref 65–99)
Potassium: 4 mmol/L (ref 3.5–5.1)
Sodium: 152 mmol/L — ABNORMAL HIGH (ref 135–145)

## 2016-06-03 LAB — MAGNESIUM: MAGNESIUM: 2.8 mg/dL — AB (ref 1.7–2.4)

## 2016-06-03 LAB — CULTURE, RESPIRATORY W GRAM STAIN

## 2016-06-03 LAB — PHOSPHORUS: Phosphorus: 3.5 mg/dL (ref 2.5–4.6)

## 2016-06-03 MED ORDER — FUROSEMIDE 10 MG/ML IJ SOLN
20.0000 mg | Freq: Two times a day (BID) | INTRAMUSCULAR | Status: DC
  Administered 2016-06-03 – 2016-06-05 (×5): 20 mg via INTRAVENOUS
  Filled 2016-06-03 (×5): qty 2

## 2016-06-03 MED ORDER — FREE WATER: 200.0000 mL | Freq: Three times a day (TID) | Status: DC

## 2016-06-03 MED ORDER — FREE WATER
250.0000 mL | Freq: Four times a day (QID) | Status: DC
  Administered 2016-06-03 – 2016-06-04 (×4): 250 mL

## 2016-06-03 NOTE — Progress Notes (Signed)
eLink Physician-Brief Progress Note Patient Name: Douglas SermonsClifford M Hershman DOB: 1965-06-08 MRN: [id number]   Date of Service  06/03/2016  HPI/Events of Note  Patient with bradycardia into 40's BP ok  eICU Interventions  Plan to wean off precedex as tolerated and increased fentanyl is needed        Erin FullingKurian Tennile Styles 06/03/2016, 9:54 PM

## 2016-06-03 NOTE — Progress Notes (Signed)
PULMONARY / CRITICAL CARE MEDICINE   ADMISSION DATE:  05/24/2016 CONSULTATION DATE:  05/24/2016  REFERRING MD :  Dr. Herbie BaltimoreHarding  CHIEF COMPLAINT:  STEMI/Post arrest  BRIEF:  51 y/o male with known CAP and systolic CHF admitted on 1/20 with STEMI, cardiac arrest.  Has remained intubated since admission.  PCI placed to circ on admission.   SUBJECTIVE:  Reintubated yesterday. MRI without changes. Repeat EEG without seizure activity.   Opens eyes to voice. Following most commands (will lift L arm and wiggles toes.)  VITAL SIGNS: Temp:  [98.6 F (37 C)-102.1 F (38.9 C)] 98.6 F (37 C) (01/30 0700) Pulse Rate:  [58-85] 62 (01/30 0721) Resp:  [18-24] 24 (01/30 0721) BP: (80-156)/(41-98) 156/50 (01/30 0721) SpO2:  [86 %-100 %] 100 % (01/30 0721) FiO2 (%):  [50 %-100 %] 60 % (01/30 0721) HEMODYNAMICS:   VENTILATOR SETTINGS: Vent Mode: PRVC FiO2 (%):  [50 %-100 %] 60 % Set Rate:  [18 bmp] 18 bmp Vt Set:  [560 mL] 560 mL PEEP:  [5 cmH20] 5 cmH20 Plateau Pressure:  [16 cmH20-19 cmH20] 16 cmH20 INTAKE / OUTPUT:  Intake/Output Summary (Last 24 hours) at 06/03/16 0802 Last data filed at 06/03/16 0700  Gross per 24 hour  Intake          1679.86 ml  Output             1500 ml  Net           179.86 ml   PHYSICAL EXAMINATION: General: Middle aged man resting in bed, on vent HENT: [place]/AT, EOMI, PERRL, ETT in place PULM: Coarse breath sounds bilaterally CV: RRR, no m/g/r GI: BS+, soft, nontender MSK: moves all extremities spontaneously, no edema Neuro: awakens to voice, follows most commands   LABS:  CBC  Recent Labs Lab 06/01/16 0219 06/02/16 0227 06/03/16 0158  WBC 12.8* 12.2* 13.9*  HGB 10.9* 10.8* 10.7*  HCT 35.2* 35.1* 35.2*  PLT 167 164 194   Coag's No results for input(s): APTT, INR in the last 168 hours. BMET  Recent Labs Lab 06/01/16 0219 06/02/16 0227 06/03/16 0158  NA 151* 150* 152*  K 3.6 3.7 4.0  CL 114* 115* 117*  CO2 28 26 28   BUN 37* 32* 31*   CREATININE 0.94 1.05 1.02  GLUCOSE 219* 142* 173*   Electrolytes  Recent Labs Lab 05/27/16 1701  05/31/16 0443  06/01/16 0219 06/02/16 0227 06/03/16 0158  CALCIUM  --   < > 8.5*  < > 8.5* 8.5* 8.4*  MG 2.4  --  2.7*  --   --   --  2.8*  PHOS 2.9  --  4.3  --   --   --  3.5  < > = values in this interval not displayed. Sepsis Markers  Recent Labs Lab 05/28/16 0430  PROCALCITON <0.10   ABG  Recent Labs Lab 05/27/16 1354 06/03/16 0322  PHART 7.397 7.476*  PCO2ART 50.8* 37.4  PO2ART 107.0 50.0*   Liver Enzymes No results for input(s): AST, ALT, ALKPHOS, BILITOT, ALBUMIN in the last 168 hours. Cardiac Enzymes No results for input(s): TROPONINI, PROBNP in the last 168 hours. Glucose  Recent Labs Lab 06/02/16 1150 06/02/16 1713 06/02/16 2037 06/02/16 2342 06/03/16 0403 06/03/16 0756  GLUCAP 131* 145* 152* 164* 155* 162*   Imaging Mr Brain Wo Contrast  Result Date: 06/02/2016 CLINICAL DATA:  51 y/o M; cardiac arrest: 05/24/2016, seizures, and possible left thalamus acute infarct. EXAM: MRI HEAD WITHOUT CONTRAST TECHNIQUE: Multiplanar,  multiecho pulse sequences of the brain and surrounding structures were obtained without intravenous contrast. COMPARISON:  05/28/2016 MRI of the head. FINDINGS: Brain: Unchanged small acute infarct in the left thalamus. No new diffusion signal abnormality. Foci of cortical T2 FLAIR hyperintensity scattered over convexities bilaterally likely representing small chronic infarctions. Mild diffuse brain parenchymal volume loss. No abnormal susceptibility hypointensity to indicate intracranial hemorrhage. The no focal mass effect. No hydrocephalus. No extra-axial collection. No brain structural abnormality or heterotopia. Hippocampi by are symmetric in size and signal. Vascular: Normal flow voids. Skull and upper cervical spine: Normal marrow signal. Sinuses/Orbits: Paranasal sinus mucosal thickening and trace mastoid effusions is likely due to  intubation. Other: None. IMPRESSION: 1. Stable left thalamus 4 mm acute infarction. No new acute infarct or acute intracranial hemorrhage. 2. Stable few scattered chronic cortical infarcts over the convexities bilaterally. 3. No structural cause of seizure identified. Electronically Signed   By: Mitzi Hansen M.D.   On: 06/02/2016 19:28   Dg Chest Port 1 View  Result Date: 06/02/2016 CLINICAL DATA:  51 year old male with endotracheal tube placement. Subsequent encounter. EXAM: PORTABLE CHEST 1 VIEW COMPARISON:  05/31/2016. FINDINGS: Endotracheal tube tip 2.7 cm above the carina. This will need to be monitored closely or retracted slightly to avoid mainstem bronchus intubation with change in patient neck position. Nasogastric tube courses below the diaphragm. Tip is not included on the present exam. Granulomas right lung. Poor inspiration with basilar atelectasis greater on left. Heart size top-normal. IMPRESSION: Endotracheal tube tip 2.7 cm above the carina. This will need to be monitored closely or retracted slightly to avoid mainstem bronchus intubation with change in patient neck position. Poor inspiration with basilar atelectasis greater on left. Electronically Signed   By: Lacy Duverney M.D.   On: 06/02/2016 12:39   Lines/Tubes: ETT 1/20 >>1/28 L IJ CVL 1/20 >>1/28 ETT 1/29>>  Cultures: Resp 1/28>> abundant staph aureus  Blood 1/27>>  Antibiotics: Unasyn 1/21>>>1/23 Vanc 1/29>> Zosyn 1/29>>  Studies: CT Head 1/20 >> negative CXR 1/20 >> Endotracheal tube in satisfactory position. Worsening bilateral interstitial opacities which may reflect edema or infection. CXR 1/21 >> Tip of the left central line in the proximal SVC. No Pneumothorax. Developing hazy opacity at the right lung base, likely pleural fluid. Worsening interstitial opacities may be increasing pulmonary edema, pneumonia, or ARDS. EEG 1/20 >> moderate to severe generalized slowing.  No epileptiform discharges or  seizures.  Improved some relative to burst suppression pattern seen yesterday.  MRI 1/24>> Possible 4 mm acute infarct left thalamus. No other acute infarct. Mild chronic ischemia of the convexity bilaterally right greater than left. EEG 1/25>>mild to moderate diffuse generalized slowing, improved somewhat with sedation discontinued. Episodes of twitching on right side recorded but did not show seizure activity.  MRI 1/29>> Stable left thalamus 4 mm infarct. No new acute infarcts. EEG 1/29>> diffuse slowing, diffuse cerebral dysfunction that is non-specific   ASSESSMENT / PLAN:  PULMONARY A: Acute respiratory failure with hypoxemia Chronic R side pulmonary nodule Extubated 1/28 Reintubated 1/29 due to not protecting airway Staph aureus in trach aspirate  P:   PS as tolerated once sedation is decreased Continue Vanc and zosyn pCXR in AM  CARDIOVASCULAR A:  Cardiac arrest STEMI, s/p PCI to circumflex Systolic CHF Hypertension NSVT this admission P:  Anti-platelets, statin Tele D/C Lisinopril and beta blockers  RENAL A:   AKI resolved Hypokalemia- resolved Hypernatremia P:   Add free water at 250 ml q6 Monitor BMET and UOP Replace  electrolytes as needed  GASTROINTESTINAL A:   Nutrition needs Stress ulcer prophylaxis P:   NPO  Restart TFs Continue famotidine  HEMATOLOGIC A:   Leukocytosis Normocytic anemia- likely from acute illness P:  Continue sub q heparin  Monitor CBC intermittently   INFECTIOUS A:   Continued fevers and leukocytosis Staph aureus in trach aspirate P:   Antibiotics as above Await trach asp sensitivities  Follow cultures  Continue APAP to treat fever aggressively  ENDOCRINE A:   DM2  hyperglycemia P:   Continue SSI  NEUROLOGIC A:   Acute encephalopathy> slow improvement Seizures 1/20  P:   Continue Keppra Control blood sugar, keep normothermic as able   Rich Number, MD, MPH Internal Medicine Resident, PGY-III Pager:  [phone]  Attending Note:  51 year old male s/p cardiac arrest who lost ability to protect his airway.  He is able to follow commands on exam and lungs were clear.  CXR that I reviewed myself with ETT in good position.  Will increase free water to 250 ml q6 and f/u labs.  D/C fentanyl drip and half the precedex then attempt weaning.  Hope is that we decrease sedation and be off pressors.  Will need to speak with family towards the end of the week for a one way extubation.  The patient is critically ill with multiple organ systems failure and requires high complexity decision making for assessment and support, frequent evaluation and titration of therapies, application of advanced monitoring technologies and extensive interpretation of multiple databases.   Critical Care Time devoted to patient care services described in this note is  35  Minutes. This time reflects time of care of this signee Dr Koren Bound. This critical care time does not reflect procedure time, or teaching time or supervisory time of PA/NP/Med student/Med Resident etc but could involve care discussion time.  Alyson Reedy, M.D. [facility] Pulmonary/Critical Care Medicine. Pager: [phone]. After hours pager: [phone].  06/03/2016 8:02 AM

## 2016-06-03 NOTE — Progress Notes (Signed)
Patient Name: Douglas Carr Date of Encounter: 06/03/2016  Primary Cardiologist: Dr. Serena Croissant Problem List     Principal Problem:   Cardiac arrest with ventricular fibrillation [facility]) Active Problems:   Essential hypertension   Status post coronary artery stent placement   Dilated cardiomyopathy ([place])   Coronary artery disease involving native coronary artery of native heart with angina pectoris ([place])   ST elevation myocardial infarction (STEMI) ([place])   Acute encephalopathy   Cardiac arrest ([place])   Anoxic brain injury ([place])   Anoxic encephalopathy ([place])   Seizures ([place])   Goals of care, counseling/discussion   Palliative care encounter   Central venous catheter in place   Encounter for hospice care discussion   Palliative care by specialist   Ventilator dependent ([place])   Acute respiratory failure with hypoxia ([place])   Myoclonus   Anoxia   Diabetes mellitus type 2 in obese ([place])   Aspiration pneumonia ([place])   Acute on chronic combined systolic and diastolic CHF (congestive heart failure) ([facility])   Fever   Hypernatremia   Leukocytosis   Acute blood loss anemia     Subjective   The patient had to be re-intubated yesterday, no clinical improvement, follows minimal commands. No seizures on EEG.   Inpatient Medications    Scheduled Meds: . aspirin  81 mg Per Tube Daily  . atorvastatin  80 mg Per Tube q1800  . carvedilol  12.5 mg Per Tube BID WC  . chlorhexidine gluconate (MEDLINE KIT)  15 mL Mouth Rinse BID  . docusate  100 mg Per Tube BID  . famotidine  20 mg Oral Daily  . free water  200 mL Per Tube Q8H  . heparin  5,000 Units Subcutaneous Q8H  . insulin aspart  0-15 Units Subcutaneous Q4H  . levETIRAcetam  1,000 mg Intravenous Q12H  . lisinopril  2.5 mg Oral Daily  . mouth rinse  15 mL Mouth Rinse 10 times per day  . multivitamin with minerals  1 tablet Oral Daily  . piperacillin-tazobactam (ZOSYN)  IV  3.375 g Intravenous Q8H  . sennosides  5 mL  Per Tube BID  . ticagrelor  90 mg Oral BID  . vancomycin  1,000 mg Intravenous Q8H   Continuous Infusions: . dexmedetomidine 0.5 mcg/kg/hr (06/03/16 0824)  . fentaNYL infusion INTRAVENOUS 50 mcg/hr (06/03/16 0700)  . norepinephrine (LEVOPHED) Adult infusion 2 mcg/min (06/03/16 0700)  . propofol (DIPRIVAN) infusion Stopped (06/02/16 2001)   PRN Meds: acetaminophen, acetaminophen, bisacodyl, labetalol, ondansetron (ZOFRAN) IV   Vital Signs    Vitals:   06/03/16 0600 06/03/16 0700 06/03/16 0721 06/03/16 0800  BP: (!) 146/63 (!) 145/51 (!) 156/50 (!) 113/55  Pulse: (!) 59 (!) 58 62 (!) 56  Resp: 18 18 (!) 24 18  Temp:  98.6 F (37 C)    TempSrc:  Rectal    SpO2: 100% 100% 100% 100%  Weight:      Height:        Intake/Output Summary (Last 24 hours) at 06/03/16 0938 Last data filed at 06/03/16 0824  Gross per 24 hour  Intake          1602.86 ml  Output             1500 ml  Net           102.86 ml   Filed Weights   05/31/16 0416 06/01/16 0327 06/02/16 0429  Weight: 228 lb 6.3 oz (103.6 kg) 227 lb 15.3 oz (103.4  kg) 219 lb 12.8 oz (99.7 kg)    Physical Exam     GEN:WD on vent; opens eyes and tracks HEENT: normal Neck: supple Cardiac:RRR Respiratory:CTA WU:JWJX, non-distended  MS:No edema; No deformity. Neuro: Opens eye to voice and tracks    Labs    CBC  Recent Labs  06/02/16 0227 06/03/16 0158  WBC 12.2* 13.9*  HGB 10.8* 10.7*  HCT 35.1* 35.2*  MCV 90.0 90.5  PLT 164 914   Basic Metabolic Panel  Recent Labs  06/02/16 0227 06/03/16 0158  NA 150* 152*  K 3.7 4.0  CL 115* 117*  CO2 26 28  GLUCOSE 142* 173*  BUN 32* 31*  CREATININE 1.05 1.02  CALCIUM 8.5* 8.4*  MG  --  2.8*  PHOS  --  3.5   Fasting Lipid Panel  Recent Labs  06/02/16 1511  TRIG 120     Telemetry    NSR with occasional PVC - Personally Reviewed  Radiology    Mr Brain Wo Contrast  Result Date: 06/02/2016 CLINICAL DATA:  51 y/o M; cardiac arrest:  05/24/2016, seizures, and possible left thalamus acute infarct. EXAM: MRI HEAD WITHOUT CONTRAST TECHNIQUE: Multiplanar, multiecho pulse sequences of the brain and surrounding structures were obtained without intravenous contrast. COMPARISON:  05/28/2016 MRI of the head. FINDINGS: Brain: Unchanged small acute infarct in the left thalamus. No new diffusion signal abnormality. Foci of cortical T2 FLAIR hyperintensity scattered over convexities bilaterally likely representing small chronic infarctions. Mild diffuse brain parenchymal volume loss. No abnormal susceptibility hypointensity to indicate intracranial hemorrhage. The no focal mass effect. No hydrocephalus. No extra-axial collection. No brain structural abnormality or heterotopia. Hippocampi by are symmetric in size and signal. Vascular: Normal flow voids. Skull and upper cervical spine: Normal marrow signal. Sinuses/Orbits: Paranasal sinus mucosal thickening and trace mastoid effusions is likely due to intubation. Other: None. IMPRESSION: 1. Stable left thalamus 4 mm acute infarction. No new acute infarct or acute intracranial hemorrhage. 2. Stable few scattered chronic cortical infarcts over the convexities bilaterally. 3. No structural cause of seizure identified. Electronically Signed   By: Kristine Garbe M.D.   On: 06/02/2016 19:28   Dg Chest Port 1 View  Result Date: 06/03/2016 CLINICAL DATA:  Status post cardiac arrest.  Intubated patient. EXAM: PORTABLE CHEST 1 VIEW COMPARISON:  Single-view of the chest 06/02/2016 and 05/31/2016. FINDINGS: Endotracheal tube and NG tube remain in place. Bibasilar atelectasis is again seen and shows some increase on the right since yesterday's study. No pneumothorax. Heart size is upper normal. Aortic atherosclerosis noted. No pneumothorax. IMPRESSION: Bibasilar subsegmental atelectasis has increased on the right. Atherosclerosis. Electronically Signed   By: Inge Rise M.D.   On: 06/03/2016 08:03   Dg  Chest Port 1 View  Result Date: 06/02/2016 CLINICAL DATA:  51 year old male with endotracheal tube placement. Subsequent encounter. EXAM: PORTABLE CHEST 1 VIEW COMPARISON:  05/31/2016. FINDINGS: Endotracheal tube tip 2.7 cm above the carina. This will need to be monitored closely or retracted slightly to avoid mainstem bronchus intubation with change in patient neck position. Nasogastric tube courses below the diaphragm. Tip is not included on the present exam. Granulomas right lung. Poor inspiration with basilar atelectasis greater on left. Heart size top-normal. IMPRESSION: Endotracheal tube tip 2.7 cm above the carina. This will need to be monitored closely or retracted slightly to avoid mainstem bronchus intubation with change in patient neck position. Poor inspiration with basilar atelectasis greater on left. Electronically Signed   By: Alcide Evener.D.  On: 06/02/2016 12:39   Dg Abd Portable 1v  Result Date: 06/01/2016 CLINICAL DATA:  Nasogastric tube was advanced. EXAM: PORTABLE ABDOMEN - 1 VIEW COMPARISON:  Earlier this day at 2252 hours FINDINGS: Tip of the enteric tube in the right upper quadrant of the abdomen, possible stomach or proximal duodenum. Side port of the enteric tube in the stomach. Nonobstructive bowel gas pattern in the visualized upper abdomen. IMPRESSION: Enteric tube in the stomach, tip either in the distal stomach or proximal duodenum. Electronically Signed   By: Jeb Levering M.D.   On: 06/01/2016 23:32   Dg Abd Portable 1v  Result Date: 06/01/2016 CLINICAL DATA:  Initial evaluation for NG tube placement. EXAM: PORTABLE ABDOMEN - 1 VIEW COMPARISON:  Prior radiograph from 05/27/2016. FINDINGS: NG tube in place. Tip appears to lie at or just above the GE junction, with side hole likely an knee esophagus. Advancement by approximately 7 cm is suggested. Visualized bowel gas pattern within normal limits. IMPRESSION: Tip of NG tube at or just above the GE junction. Advancement  by approximately 7 cm is suggested. Electronically Signed   By: Jeannine Boga M.D.   On: 06/01/2016 23:17    Cardiac Studies   Cardiac Cath: 08/09/78   LV end diastolic pressure is severely elevated.  Ost LAD to Prox LAD lesion, 30 %stenosed.  Ost 2nd Diag to 2nd Diag lesion, 50 %stenosed.  Ost 1st Diag to 1st Diag lesion, 70 %stenosed.  Ost Cx to Prox Cx lesion, 50 %stenosed.  Ost 1st Mrg to 1st Mrg lesion, 35 %stenosed.  2nd Mrg lesion, 90 %stenosed.  A STENT PROMUS PREM MR 2.5X24 drug eluting stent was successfully placed.  Post intervention, there is a 0% residual stenosis.  Prox RCA to Mid RCA lesion, 50 %stenosed.  RPDA lesion, 100 %stenosed.  1. Diffuse coronary artery disease with relatively small diabetic vessels.  2. Severe segmental stenosis in the second OM. Lesion is hazy and ulcerative. This is the culprit lesion 3. Severely elevated LVEDP 4. Successful stenting of the second OM with DES.  Plan: DAPT for one year. Aggressive risk factor modification. Will treat with IV lasix for elevated LVEDP. Add ARB, beta blocker as tolerated. Assess LV function with Echo. Trend serial troponins and ECG.      Cath 05/24/16  Ost Cx to Mid Cx lesion, 70 %stenosed leading into OM2 as the main trunk of the Circumflex.  Ost 2nd Mrg to 2nd Mrg recent Promus DES 2.5 x 24 stent, - focal 90 %stenosed with what appears to be proximal edge dissection with thrombus  A STENT PROMUS PREM MR 2.5X20 drug eluting stent was successfully placed from [place] of Circumflex into OM2, and overlaps previously placed stent.  Post intervention, there is a 0% residual stenosis.  ____________________________________________________  Colon [place] LAD to Prox LAD lesion, 30 %stenosed.  Ost 1st Diag to 1st Diag lesion, 70 %stenosed.  Ost 1st Mrg to 1st Mrg lesion, 35 %stenosed. Ost 2nd Diag to 2nd Diag lesion, 50 %stenosed.  Prox RCA to Mid RCA lesion, 65 %stenosed - lesion appears similar to  prior cath, with mild progression.  Very distal RPDA lesion, 100 %stenosed.  LV end diastolic pressure is severely elevated.  There is no aortic valve stenosis.   Status post what is most likely VF or VT arrest 2 weeks post STEMI with PCI to the circumflex.  His EKG itself did not show signs of ST elevation, however given his recent STEMI, known cardiac myopathy and presentation with  cardiac arrest, I felt it prudent taken to the Cath Lab.  Angiographically there appeared to be a possible proximal edge dissection with thrombus in this lesion, however there is TIMI 2 flow. This lesion was treated with an overlapping proximal stent up to the ostium of the circumflex and OM 2. Angiographically there was good result. It is quite possible at least 1 and strut is into the left main, but non flow-limiting.  Plan:  He'll be admitted to the CCU on hypothermia protocol per PCCM  Continue amiodarone overnight as he appears to have converted into sinus rhythm  Hold ARB and Imdur for now. We'll try to start beta blocker.  Cardiology will follow along while on cooling protocol.   ECHO 05/15/16 - Left ventricle: The cavity size was moderately dilated. Wall thickness was normal. Systolic function was severely reduced. The estimated ejection fraction was in the range of 25% to 30%. Akinesis and scarring of the anteroseptal and anterior myocardium; consistent with infarction in the distribution of the left anterior descending coronary artery. Dyskinesis of the apicalanterior myocardium. Features are consistent with a pseudonormal left ventricular filling pattern, with concomitant abnormal relaxation and increased filling pressure (grade 2 diastolic dysfunction). No evidence of thrombus. - Left atrium: The atrium was mildly dilated. - Atrial septum: No defect or patent foramen ovale was identified.    Patient Profile     51 y.o. male with PMH of chronic combined HF  (25-30%), HLD R-CEA 2010, DM2, STEMI w/ DES OM2 05/07/2016 who presented to the ED on 05/24/16 as a post arrest with anoxic brain injury. Re-intubated today, minimal neurologic recovery.   Assessment & Plan    Cardiac arrest: found by family pulseless, shaking. Felt 2/2 to VT/VF arrest, shocked by AED on the field. With recent STEMI, Dilated CM and presentation with cardiac arrest, he was taken back for cath. This showed possible edge dissection in circ, treated with an overlapping DES. He was placed on cooling protocol in the cath lab. Amiodarone DCed and no significant arrhythmias in last 48 hrs. I would continue coreq only.   CAD: Status post PCI of circumflex. Continue aspirin, statin and brilinta.  Anoxic brain injury: neurology following; some improvement today. Neurology now feels his prognosis is better.  Chronic systolic HF/ischemic cardiomyopathy:  Coreg and lisinopril held with reintubation, as he is hypotensive. On low dose Levophed 2 mcg/kg/min, start lasix 20 mg iv q12h.   NSVT: No recent NSVT.  Hyponatremia: on D5W. Follow  Overall prognosis poor with minimal neurologic recovery.   Ena Dawley, MD 06/03/2016

## 2016-06-03 NOTE — Progress Notes (Signed)
Subjective: Intubated yesterday, but actually is following commands better today than before.  Exam: Vitals:   06/03/16 1600 06/03/16 1601  BP: 124/64 124/64  Pulse: (!) 55 (!) 57  Resp: 18 17  Temp:     Gen: In bed, Intubated Resp: Ventilated Abd: soft, nt  Neuro: MS: Awake, alert, engages with examiner, follows commands to lift up his arms and will toes readily. CN: Pupils equal and reactive, place to threat bilaterally Motor: Moves all extremities well Sensory: Intact to light touch.  Pertinent Labs: LDL 58  Impression: 51 year old male with anoxic brain injury which is showing improvement. I suspect that his worsening yesterday was more medically driven as opposed to neurologically driven. I think that he does have a significant chance of recovery would favor continued supportive treatment.  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 It appears improved today. Medications for this that can be helpful for this include Depakote, Keppra, clonazepam. In his case I think clonazepam may be helpful but would also likely be sedating and therefore I would favor just continuing Keppra for now.  I think that the stroke is likely small vessel disease, versus small area that was more sensitive to hypoxia than other areas due to possible tiny vessel stenosis.  I do not think any further workup would be likely to benefit him at the current time. His LDL is already well-controlled of 58 and is on dual antiplatelet therapy.  Recommendations: 1) continue Keppra 1 g twice a day 2) if myoclonus continues, could consider clonazepam 0.25 mg twice a day titrated up slowly 3) alternative would be to start Depakote 300 mg 3 times a day 4) continue supportive care 5) no further recommendations from neurology at this time, please call with further questions or concerns. Neurology was signed off.  Ritta SlotMcNeill Kirkpatrick, MD [facility] [phone]  If 7pm- 7am,  please page neurology on call as listed in AMION.

## 2016-06-03 NOTE — Progress Notes (Signed)
Patient had an episode of bradycardia. HR decreased into the 40's. Titrated precedex and fentanyl down to see if it would help HR increase.   Bradycardia continued; called [facility] to notify about HR.  Received orders to wean off precedex as tolerated by patient and increase fentanyl if patient becomes agitated.   Also asked MD about re-starting tube feeds. Per [facility] MD tube feeds will be restarted in the AM.   Yajahira Tison E Mylinda LatinaNino Combs, RN

## 2016-06-04 ENCOUNTER — Inpatient Hospital Stay (HOSPITAL_COMMUNITY): Payer: BC Managed Care – PPO

## 2016-06-04 ENCOUNTER — Other Ambulatory Visit (HOSPITAL_COMMUNITY): Payer: BC Managed Care – PPO

## 2016-06-04 LAB — BASIC METABOLIC PANEL
Anion gap: 11 (ref 5–15)
BUN: 11 mg/dL (ref 6–20)
CALCIUM: 8.3 mg/dL — AB (ref 8.9–10.3)
CO2: 24 mmol/L (ref 22–32)
CREATININE: 0.74 mg/dL (ref 0.61–1.24)
Chloride: 106 mmol/L (ref 101–111)
GFR calc non Af Amer: >60 mL/min (ref >60)
Glucose, Bld: 135 mg/dL — ABNORMAL HIGH (ref 65–99)
Potassium: 3.6 mmol/L (ref 3.5–5.1)
SODIUM: 141 mmol/L (ref 135–145)

## 2016-06-04 LAB — GLUCOSE, CAPILLARY
GLUCOSE-CAPILLARY: 136 mg/dL — AB (ref 65–99)
GLUCOSE-CAPILLARY: 149 mg/dL — AB (ref 65–99)
Glucose-Capillary: 146 mg/dL — ABNORMAL HIGH (ref 65–99)
Glucose-Capillary: 119 mg/dL — ABNORMAL HIGH (ref 65–99)
Glucose-Capillary: 145 mg/dL — ABNORMAL HIGH (ref 65–99)

## 2016-06-04 MED ORDER — POTASSIUM CHLORIDE 20 MEQ/15ML (10%) PO SOLN
40.0000 meq | Freq: Three times a day (TID) | ORAL | Status: AC
  Administered 2016-06-04 (×2): 40 meq
  Filled 2016-06-04 (×2): qty 30

## 2016-06-04 MED ORDER — CARVEDILOL 3.125 MG PO TABS
3.1250 mg | ORAL_TABLET | Freq: Two times a day (BID) | ORAL | Status: DC
  Administered 2016-06-04 – 2016-06-07 (×8): 3.125 mg via ORAL
  Filled 2016-06-04 (×8): qty 1

## 2016-06-04 MED ORDER — SODIUM CHLORIDE 0.9 % IV SOLN: INTRAVENOUS | Status: DC | PRN

## 2016-06-04 MED ORDER — CEFAZOLIN SODIUM-DEXTROSE 2-4 GM/100ML-% IV SOLN
2.0000 g | Freq: Three times a day (TID) | INTRAVENOUS | Status: DC
  Administered 2016-06-04 – 2016-06-06 (×6): 2 g via INTRAVENOUS
  Filled 2016-06-04 (×8): qty 100

## 2016-06-04 NOTE — Progress Notes (Signed)
PULMONARY / CRITICAL CARE MEDICINE   ADMISSION DATE:  05/24/2016 CONSULTATION DATE:  05/24/2016  REFERRING MD :  Dr. Herbie Carr  CHIEF COMPLAINT:  STEMI/Post arrest  BRIEF:  51 y/o male with known CAP and systolic CHF admitted on 1/20 with STEMI, cardiac arrest.  Has remained intubated since admission.  PCI placed to circ on admission.   SUBJECTIVE:  Had bradycardia in 40s overnight. Precedex weaned down, HR now in 60s. Following commands this morning, raises both arms and wiggles toes.   VITAL SIGNS: Temp:  [98.4 F (36.9 C)-99.1 F (37.3 C)] 98.6 F (37 C) (01/31 0753) Pulse Rate:  [54-65] 62 (01/31 0600) Resp:  [17-30] 18 (01/31 0600) BP: (109-158)/(32-120) 142/73 (01/31 0600) SpO2:  [99 %-100 %] 99 % (01/31 0600) FiO2 (%):  [50 %-60 %] 50 % (01/31 0400) HEMODYNAMICS:   VENTILATOR SETTINGS: Vent Mode: PRVC FiO2 (%):  [50 %-60 %] 50 % Set Rate:  [18 bmp] 18 bmp Vt Set:  [560 mL] 560 mL PEEP:  [5 cmH20] 5 cmH20 Plateau Pressure:  [10 cmH20-18 cmH20] 16 cmH20 INTAKE / OUTPUT:  Intake/Output Summary (Last 24 hours) at 06/04/16 0759 Last data filed at 06/04/16 0600  Gross per 24 hour  Intake          1391.97 ml  Output             2000 ml  Net          -608.03 ml   PHYSICAL EXAMINATION: General: Middle aged man resting in bed, on vent HENT: [place]/AT, EOMI, PERRL, ETT in place PULM: CTA bilaterally CV: RRR, no m/g/r GI: BS+, soft, nontender MSK: moves all extremities spontaneously, no edema Neuro: awakens to voice, follows commands   LABS:  CBC  Recent Labs Lab 06/01/16 0219 06/02/16 0227 06/03/16 0158  WBC 12.8* 12.2* 13.9*  HGB 10.9* 10.8* 10.7*  HCT 35.2* 35.1* 35.2*  PLT 167 164 194   Coag's No results for input(s): APTT, INR in the last 168 hours. BMET  Recent Labs Lab 06/02/16 0227 06/03/16 0158 06/04/16 0458  NA 150* 152* 141  K 3.7 4.0 3.6  CL 115* 117* 106  CO2 26 28 24   BUN 32* 31* 11  CREATININE 1.05 1.02 0.74  GLUCOSE 142* 173* 135*    Electrolytes  Recent Labs Lab 05/31/16 0443  06/02/16 0227 06/03/16 0158 06/04/16 0458  CALCIUM 8.5*  < > 8.5* 8.4* 8.3*  MG 2.7*  --   --  2.8*  --   PHOS 4.3  --   --  3.5  --   < > = values in this interval not displayed. Sepsis Markers No results for input(s): LATICACIDVEN, PROCALCITON, O2SATVEN in the last 168 hours. ABG  Recent Labs Lab 06/03/16 0322  PHART 7.476*  PCO2ART 37.4  PO2ART 50.0*   Liver Enzymes No results for input(s): AST, ALT, ALKPHOS, BILITOT, ALBUMIN in the last 168 hours. Cardiac Enzymes No results for input(s): TROPONINI, PROBNP in the last 168 hours. Glucose  Recent Labs Lab 06/03/16 1134 06/03/16 1617 06/03/16 2029 06/03/16 2334 06/04/16 0423 06/04/16 0746  GLUCAP 166* 159* 146* 143* 136* 149*   Imaging No results found. Lines/Tubes: ETT 1/20 >>1/28 L IJ CVL 1/20 >>1/28 ETT 1/29>>  Cultures: Resp 1/28>> abundant staph aureus (MSSA), pansensitive  Blood 1/27>>  Antibiotics: Unasyn 1/21>>>1/23 Vanc 1/29>>1/31 Zosyn 1/29>>1/31 Ancef 1/31>>>  Studies: CT Head 1/20 >> negative CXR 1/20 >> Endotracheal tube in satisfactory position. Worsening bilateral interstitial opacities which may reflect edema  or infection. CXR 1/21 >> Tip of the left central line in the proximal SVC. No Pneumothorax. Developing hazy opacity at the right lung base, likely pleural fluid. Worsening interstitial opacities may be increasing pulmonary edema, pneumonia, or ARDS. EEG 1/20 >> moderate to severe generalized slowing.  No epileptiform discharges or seizures.  Improved some relative to burst suppression pattern seen yesterday.  MRI 1/24>> Possible 4 mm acute infarct left thalamus. No other acute infarct. Mild chronic ischemia of the convexity bilaterally right greater than left. EEG 1/25>>mild to moderate diffuse generalized slowing, improved somewhat with sedation discontinued. Episodes of twitching on right side recorded but did not show seizure  activity.  MRI 1/29>> Stable left thalamus 4 mm infarct. No new acute infarcts. EEG 1/29>> diffuse slowing, diffuse cerebral dysfunction that is non-specific   ASSESSMENT / PLAN:  PULMONARY A: Acute respiratory failure with hypoxemia Chronic R side pulmonary nodule Extubated 1/28 Reintubated 1/29 due to not protecting airway MSSA in trach aspirate  P:   PS as tolerated  Stop Vanc/Zosyn, switch to ancef  Wife to arrive today, will discuss one way extubation as she states patient would not want trach/peg, will also need to discuss code status.  CARDIOVASCULAR A:  Cardiac arrest STEMI, s/p PCI to circumflex Systolic CHF Hypertension NSVT this admission P:  Levophed off Anti-platelets, statin Tele Restart lisinopril if continues to be hypertensive Lasix 20 q12h  RENAL A:   AKI resolved Hypokalemia- resolved Hypernatremia- resolved P:   Stop free water Monitor BMET and UOP Replace electrolytes as needed  GASTROINTESTINAL A:   Nutrition needs Stress ulcer prophylaxis P:   NPO  Restart TFs Continue famotidine  HEMATOLOGIC A:   Leukocytosis Normocytic anemia- likely from acute illness P:  Continue sub q heparin  Monitor CBC intermittently   INFECTIOUS A:   Continued fevers and leukocytosis> resolving  MSSA in trach aspirate P:   Deescalate abx - Nafcillin  Follow cultures  Continue APAP to treat fever aggressively  ENDOCRINE A:   DM2  hyperglycemia P:   Continue SSI  NEUROLOGIC A:   Acute encephalopathy> slow improvement Seizures 1/20  P:   Continue Keppra Control blood sugar, keep normothermic as able   Douglas Numberarly Rivet, MD, MPH Internal Medicine Resident, PGY-III Pager: [phone]  Attending Note:  51 year old male s/p cardiac arrest, intubated for inability to protect airway.  Now weaning well on exam with clear lungs.  I reviewed CXR myself, mild pulmonary edema.  Will continue weaning.  Wife is to arrive today, will need to discuss one  way extubation and DNR status.  D/C vanc/zosyn.  Start ancef.  D/C free water.  Replace K.  Hold in the ICU for now.  May consider palliative care if family does not wish for trach/peg.  The patient is critically ill with multiple organ systems failure and requires high complexity decision making for assessment and support, frequent evaluation and titration of therapies, application of advanced monitoring technologies and extensive interpretation of multiple databases.   Critical Care Time devoted to patient care services described in this note is  35  Minutes. This time reflects time of care of this signee Dr Koren BoundWesam Danyael Alipio. This critical care time does not reflect procedure time, or teaching time or supervisory time of PA/NP/Med student/Med Resident etc but could involve care discussion time.  Alyson ReedyWesam G. Federick Levene, M.D. [facility]. Pager: [phone]. After hours pager: [phone].  06/04/2016 7:59 AM

## 2016-06-04 NOTE — Progress Notes (Signed)
50mL of Fentanyl gtt wasted in sink, witnessed x2 RN (Olevia PerchesH. Ulanda Tackett, RN and Adline MangoH. Padgett, RN)

## 2016-06-04 NOTE — Evaluation (Signed)
Clinical/Bedside Swallow Evaluation Patient Details  Name: Douglas Carr MRN: [id number] Date of Birth: February 23, 1966  Today's Date: 06/04/2016 Time: SLP Start Time (ACUTE ONLY): 1429 SLP Stop Time (ACUTE ONLY): 1450 SLP Time Calculation (min) (ACUTE ONLY): 21 min  Past Medical History:  Past Medical History:  Diagnosis Date  . Acute ST elevation myocardial infarction (STEMI) involving left circumflex coronary artery (HCC) 05/07/2016   PCI to Cx-OM  . Anginal pain (HCC)    secondary to sm. vessel disease  . Anxiety   . Arthritis    BIL KNEE PAIN AND BIL ANKLE PAIN  . Bone spur of ankle   . 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 Coronary artery disease involving native coronary artery of native heart with angina pectoris (HCC) 05/24/2016   Remote MI at 51 years of age, Last cath 2010-diffuse non-obstructive disease; last echo 06/16/08 -normal LV function, moderate concentric hypertrophy; nuc 08/2008 no ischemia;  medical therapy; STEMI May 07 2016 - PCI to Cx-OM  . Diabetes mellitus    ON ORAL MEDICATION AND INSULIN  . Dilated cardiomyopathy (HCC) 05/24/2016   EF 325-30% by Echo post STEMI (previously 30-35%)   . Hyperlipidemia   . Hypertension   . Myocardial infarction 1996   Post MI  . Peripheral vascular disease (HCC)    HAS LEFT CAROTID ARTERY STENOSIS   AND IS S/P RIGHT CAROTID ENDARTERECTOMY 2010 Last carotid dopplers 01/08/2012 wth patent endarterectomy site  . Status post coronary artery stent placement    Past Surgical History:  Past Surgical History:  Procedure Laterality Date  . APPENDECTOMY    . CARDIAC CATHETERIZATION     FEB 2010, significant branch vessel disease wth diag, marginal, PDA & PLA, nml. LV function  . CARDIAC CATHETERIZATION N/A 05/07/2016   Procedure: Left Heart Cath and Coronary Angiography;  Surgeon: Peter M Swaziland, MD;  Location[facility] INVASIVE CV LAB;  Service: Cardiovascular;  Laterality: N/A;   . CARDIAC CATHETERIZATION N/A 05/07/2016   Procedure: Coronary Stent Intervention;  Surgeon: Peter M Swazil[place], MD;  Location: [facility] INVASIVE CV LAB;  Service: Cardiovascular;  Laterality: N/A;  . CARDIAC CATHETERIZATION N/A 05/24/2016   Procedure: Left Heart Cath and Coronary Angiography;  Surgeon: Marykay Lex, MD;  Location: [facility] INVASIVE CV LAB;  Service: Cardiovascular;  Laterality: N/A;  . CARDIAC CATHETERIZATION N/A 05/24/2016   Procedure: Coronary Stent Intervention;  Surgeon: Marykay Lex, MD;  Location: [facility] INVASIVE CV LAB;  Service: Cardiovascular;  Laterality: N/A;  2.5x20 Promus to Ostial/proximal circumflex  . CAROTID ENDARTERECTOMY  09/2008   Rt CEA  . LUNG BIOPSY  2013   Bx's suggest granulomatous dz.  . RT ANKLE   2013   HPI:  Douglas Carr is a 51 yo male with PMH of CAD(recently admitted from 1/3-1/7 for MI, had PCI of Om2), HFrEF (EF 35%), DMII (on insulin), HLD and carotid disease. He was discharged home from that admission, but returned shortly there after with 8lb weight gain, worsening dyspnea and decreased UOP despite full compliance with his outpatient diuretic. His BNP was 610 and weight was increased from 239-246lbs; intubated from 06/02/16-06/04/16; multiple intubations in past; 06/02/16 MRI head indicated stable left thalamus 4 mm acute infarction; no new acute infarct or intracranial hemorrhage; CXR 06/04/16: slightly improved aeration with persistent bibasilar atelectasis.   Assessment / Plan / Recommendation Clinical Impression   Pt with immediate cough with ice chips; 1/2 tsp of thin liquid; pt  is able to initiate a swallow, but expelled saliva/phlegm continuously from oral cavity during BSE; pt repeatedly requested "I want something to drink" via mouthing words d/t aphonia during assessment with education provided by SLP re: severe risk of aspiration; no family present and doubtful pt comprehended severity of deficits; Recommend NPO with ST f/u for po readiness.     Aspiration Risk  Severe aspiration risk    Diet Recommendation   NPO  Medication Administration: Via alternative means    Other  Recommendations Oral Care Recommendations: Oral care QID   Follow up Recommendations Other (comment) (TBD)      Frequency and Duration min 2x/week  1 week       Prognosis Prognosis for Safe Diet Advancement: Fair Barriers to Reach Goals: Cognitive deficits;Severity of deficits      Swallow Study   General Date of Onset: 05/24/16 HPI: [name] is a 51 yo male with PMH of CAD(recently admitted from 1/3-1/7 for MI, had PCI of Om2), HFrEF (EF 35%), DMII (on insulin), HLD and carotid disease. He was discharged home from that admission, but returned shortly there after with 8lb weight gain, worsening dyspnea and decreased UOP despite full compliance with his outpatient diuretic. His BNP was 610 and weight was increased from 239-246lbs.   Type of Study: Bedside Swallow Evaluation Diet Prior to this Study: NPO Temperature Spikes Noted: No Respiratory Status: Nasal cannula History of Recent Intubation: Yes Length of Intubations (days):  (2) Date extubated: 06/04/16 Behavior/Cognition: Alert;Requires cueing;Confused Oral Cavity Assessment: Excessive secretions Oral Care Completed by SLP: Yes Oral Cavity - Dentition: Adequate natural dentition;Missing dentition Self-Feeding Abilities: Needs assist Patient Positioning: Upright in bed Baseline Vocal Quality: Aphonic Volitional Cough: Weak;Congested;Wet Volitional Swallow: Able to elicit    Oral/Motor/Sensory Function Overall Oral Motor/Sensory Function: Other (comment) (difficult to assess; d/t pt compliance; generalized weakness)   Ice Chips Ice chips: Impaired Presentation: Spoon Pharyngeal Phase Impairments: Cough - Immediate   Thin Liquid Thin Liquid: Impaired Presentation: Spoon Pharyngeal  Phase Impairments: Cough - Immediate    Nectar Thick Nectar Thick Liquid: Not tested   Honey Thick  Honey Thick Liquid: Not tested   Puree Puree: Not tested   Solid      Solid: Not tested    Functional Assessment Tool Used: NOMS Functional Limitations: Swallowing Swallow Current Status ([id number]([id number]): At least 80 percent but less than 100 percent impaired, limited or restricted Swallow Goal Status [phone]([id number]): At least 60 percent but less than 80 percent impaired, limited or restricted   ADAMS,PAT, M.S, CCC-SLP 06/04/2016,3:38 PM

## 2016-06-04 NOTE — Procedures (Signed)
Extubation Procedure Note  Patient Details:   Name: Douglas Carr DOB: 11-08-1965 MRN: [id number]   Airway Documentation:  Airway 8 mm (Active)  Secured at (cm) 27 cm 06/04/2016  8:11 AM  Measured From Lips 06/04/2016  8:11 AM  Secured Location Right 06/04/2016  8:11 AM  Secured By [employer] Tube Holder 06/04/2016  8:11 AM  Tube Holder Repositioned Yes 06/04/2016  8:11 AM  Cuff Pressure (cm H2O) 26 cm H2O 06/03/2016  4:01 PM  Site Condition Dry 06/04/2016  3:16 AM    Evaluation  O2 sats: stable throughout Complications: No apparent complications Patient did tolerate procedure well. Bilateral Breath Sounds: Clear, Diminished   No  Suzan GaribaldiCraddock, Sheretha Shadd Ann 06/04/2016, 10:11 AM

## 2016-06-04 NOTE — Progress Notes (Signed)
Patient Name: Douglas Carr Date of Encounter: 06/04/2016  Primary Cardiologist: Dr. Serena Croissant Problem List     Principal Problem:   Cardiac arrest with ventricular fibrillation [facility]) Active Problems:   Essential hypertension   Status post coronary artery stent placement   Dilated cardiomyopathy ([place])   Coronary artery disease involving native coronary artery of native heart with angina pectoris ([place])   ST elevation myocardial infarction (STEMI) ([place])   Acute encephalopathy   Cardiac arrest ([place])   Anoxic brain injury ([place])   Anoxic encephalopathy ([place])   Seizures ([place])   Goals of care, counseling/discussion   Palliative care encounter   Central venous catheter in place   Encounter for hospice care discussion   Palliative care by specialist   Ventilator dependent ([place])   Acute respiratory failure with hypoxia ([place])   Myoclonus   Anoxia   Diabetes mellitus type 2 in obese ([place])   Aspiration pneumonia ([place])   Acute on chronic combined systolic and diastolic CHF (congestive heart failure) ([facility])   Fever   Hypernatremia   Leukocytosis   Acute blood loss anemia     Subjective   The patient had to be re-intubated yesterday, no clinical improvement, follows minimal commands. No seizures on EEG.   Inpatient Medications    Scheduled Meds: . aspirin  81 mg Per Tube Daily  . atorvastatin  80 mg Per Tube q1800  .  ceFAZolin (ANCEF) IV  2 g Intravenous Q8H  . chlorhexidine gluconate (MEDLINE KIT)  15 mL Mouth Rinse BID  . docusate  100 mg Per Tube BID  . famotidine  20 mg Oral Daily  . furosemide  20 mg Intravenous Q12H  . heparin  5,000 Units Subcutaneous Q8H  . insulin aspart  0-15 Units Subcutaneous Q4H  . levETIRAcetam  1,000 mg Intravenous Q12H  . mouth rinse  15 mL Mouth Rinse 10 times per day  . multivitamin with minerals  1 tablet Oral Daily  . sennosides  5 mL Per Tube BID  . ticagrelor  90 mg Oral BID   Continuous Infusions: . dexmedetomidine  Stopped (06/03/16 2206)  . fentaNYL infusion INTRAVENOUS Stopped (06/04/16 0730)  . norepinephrine (LEVOPHED) Adult infusion Stopped (06/04/16 0730)  . propofol (DIPRIVAN) infusion Stopped (06/02/16 2001)   PRN Meds: acetaminophen, acetaminophen, bisacodyl, labetalol, ondansetron (ZOFRAN) IV   Vital Signs    Vitals:   06/04/16 0800 06/04/16 0811 06/04/16 0830 06/04/16 0900  BP: (!) 158/57  (!) 152/57 (!) 151/64  Pulse: 72  73 74  Resp: _0 Temp:      TempSrc:      SpO2: 100% 100% 100% 100%  Weight:      Height:        Intake/Output Summary (Last 24 hours) at 06/04/16 0938 Last data filed at 06/04/16 0800  Gross per 24 hour  Intake          1368.42 ml  Output             2150 ml  Net          -781.58 ml   Filed Weights   05/31/16 0416 06/01/16 0327 06/02/16 0429  Weight: 228 lb 6.3 oz (103.6 kg) 227 lb 15.3 oz (103.4 kg) 219 lb 12.8 oz (99.7 kg)    Physical Exam     GEN:WD on vent; opens eyes and tracks HEENT: normal Neck: supple Cardiac:RRR Respiratory:CTA LK:GMWN, non-distended  MS:No edema; No deformity. Neuro: Opens eye to  voice and tracks    Labs    CBC  Recent Labs  06/02/16 0227 06/03/16 0158  WBC 12.2* 13.9*  HGB 10.8* 10.7*  HCT 35.1* 35.2*  MCV 90.0 90.5  PLT 164 300   Basic Metabolic Panel  Recent Labs  06/03/16 0158 06/04/16 0458  NA 152* 141  K 4.0 3.6  CL 117* 106  CO2 28 24  GLUCOSE 173* 135*  BUN 31* 11  CREATININE 1.02 0.74  CALCIUM 8.4* 8.3*  MG 2.8*  --   PHOS 3.5  --    Fasting Lipid Panel  Recent Labs  06/02/16 1511  TRIG 120     Telemetry    NSR with occasional PVC - Personally Reviewed  Radiology    Mr Brain Wo Contrast  Result Date: 06/02/2016 CLINICAL DATA:  51 y/o M; cardiac arrest: 05/24/2016, seizures, and possible left thalamus acute infarct. EXAM: MRI HEAD WITHOUT CONTRAST TECHNIQUE: Multiplanar, multiecho pulse sequences of the brain and surrounding structures were obtained  without intravenous contrast. COMPARISON:  05/28/2016 MRI of the head. FINDINGS: Brain: Unchanged small acute infarct in the left thalamus. No new diffusion signal abnormality. Foci of cortical T2 FLAIR hyperintensity scattered over convexities bilaterally likely representing small chronic infarctions. Mild diffuse brain parenchymal volume loss. No abnormal susceptibility hypointensity to indicate intracranial hemorrhage. The no focal mass effect. No hydrocephalus. No extra-axial collection. No brain structural abnormality or heterotopia. Hippocampi by are symmetric in size and signal. Vascular: Normal flow voids. Skull and upper cervical spine: Normal marrow signal. Sinuses/Orbits: Paranasal sinus mucosal thickening and trace mastoid effusions is likely due to intubation. Other: None. IMPRESSION: 1. Stable left thalamus 4 mm acute infarction. No new acute infarct or acute intracranial hemorrhage. 2. Stable few scattered chronic cortical infarcts over the convexities bilaterally. 3. No structural cause of seizure identified. Electronically Signed   By: Kristine Garbe M.D.   On: 06/02/2016 19:28   Dg Chest Port 1 View  Result Date: 06/03/2016 CLINICAL DATA:  Status post cardiac arrest.  Intubated patient. EXAM: PORTABLE CHEST 1 VIEW COMPARISON:  Single-view of the chest 06/02/2016 and 05/31/2016. FINDINGS: Endotracheal tube and NG tube remain in place. Bibasilar atelectasis is again seen and shows some increase on the right since yesterday's study. No pneumothorax. Heart size is upper normal. Aortic atherosclerosis noted. No pneumothorax. IMPRESSION: Bibasilar subsegmental atelectasis has increased on the right. Atherosclerosis. Electronically Signed   By: Inge Rise M.D.   On: 06/03/2016 08:03   Dg Chest Port 1 View  Result Date: 06/02/2016 CLINICAL DATA:  51 year old male with endotracheal tube placement. Subsequent encounter. EXAM: PORTABLE CHEST 1 VIEW COMPARISON:  05/31/2016. FINDINGS:  Endotracheal tube tip 2.7 cm above the carina. This will need to be monitored closely or retracted slightly to avoid mainstem bronchus intubation with change in patient neck position. Nasogastric tube courses below the diaphragm. Tip is not included on the present exam. Granulomas right lung. Poor inspiration with basilar atelectasis greater on left. Heart size top-normal. IMPRESSION: Endotracheal tube tip 2.7 cm above the carina. This will need to be monitored closely or retracted slightly to avoid mainstem bronchus intubation with change in patient neck position. Poor inspiration with basilar atelectasis greater on left. Electronically Signed   By: Genia Del M.D.   On: 06/02/2016 12:39    Cardiac Studies   Cardiac Cath: 01/05/32   LV end diastolic pressure is severely elevated.  Ost LAD to Prox LAD lesion, 30 %stenosed.  Ost 2nd Diag to 2nd Diag lesion,  50 %stenosed.  Ost 1st Diag to 1st Diag lesion, 70 %stenosed.  Ost Cx to Prox Cx lesion, 50 %stenosed.  Ost 1st Mrg to 1st Mrg lesion, 35 %stenosed.  2nd Mrg lesion, 90 %stenosed.  A STENT PROMUS PREM MR 2.5X24 drug eluting stent was successfully placed.  Post intervention, there is a 0% residual stenosis.  Prox RCA to Mid RCA lesion, 50 %stenosed.  RPDA lesion, 100 %stenosed.  1. Diffuse coronary artery disease with relatively small diabetic vessels.  2. Severe segmental stenosis in the second OM. Lesion is hazy and ulcerative. This is the culprit lesion 3. Severely elevated LVEDP 4. Successful stenting of the second OM with DES.  Plan: DAPT for one year. Aggressive risk factor modification. Will treat with IV lasix for elevated LVEDP. Add ARB, beta blocker as tolerated. Assess LV function with Echo. Trend serial troponins and ECG.      Cath 05/24/16  Ost Cx to Mid Cx lesion, 70 %stenosed leading into OM2 as the main trunk of the Circumflex.  Ost 2nd Mrg to 2nd Mrg recent Promus DES 2.5 x 24 stent, - focal 90  %stenosed with what appears to be proximal edge dissection with thrombus  A STENT PROMUS PREM MR 2.5X20 drug eluting stent was successfully placed from [place] of Circumflex into OM2, and overlaps previously placed stent.  Post intervention, there is a 0% residual stenosis.  ____________________________________________________  [place] LAD to Prox LAD lesion, 30 %stenosed.  Ost 1st Diag to 1st Diag lesion, 70 %stenosed.  Ost 1st Mrg to 1st Mrg lesion, 35 %stenosed. Ost 2nd Diag to 2nd Diag lesion, 50 %stenosed.  Prox RCA to Mid RCA lesion, 65 %stenosed - lesion appears similar to prior cath, with mild progression.  Very distal RPDA lesion, 100 %stenosed.  LV end diastolic pressure is severely elevated.  There is no aortic valve stenosis.   Status post what is most likely VF or VT arrest 2 weeks post STEMI with PCI to the circumflex.  His EKG itself did not show signs of ST elevation, however given his recent STEMI, known cardiac myopathy and presentation with cardiac arrest, I felt it prudent taken to the Cath Lab.  Angiographically there appeared to be a possible proximal edge dissection with thrombus in this lesion, however there is TIMI 2 flow. This lesion was treated with an overlapping proximal stent up to the ostium of the circumflex and OM 2. Angiographically there was good result. It is quite possible at least 1 and strut is into the left main, but non flow-limiting.  Plan:  He'll be admitted to the CCU on hypothermia protocol per PCCM  Continue amiodarone overnight as he appears to have converted into sinus rhythm  Hold ARB and Imdur for now. We'll try to start beta blocker.  Cardiology will follow along while on cooling protocol.   ECHO 05/15/16 - Left ventricle: The cavity size was moderately dilated. Wall thickness was normal. Systolic function was severely reduced. The estimated ejection fraction was in the range of 25% to 30%. Akinesis and scarring of the  anteroseptal and anterior myocardium; consistent with infarction in the distribution of the left anterior descending coronary artery. Dyskinesis of the apicalanterior myocardium. Features are consistent with a pseudonormal left ventricular filling pattern, with concomitant abnormal relaxation and increased filling pressure (grade 2 diastolic dysfunction). No evidence of thrombus. - Left atrium: The atrium was mildly dilated. - Atrial septum: No defect or patent foramen ovale was identified.    Patient Profile  52 y.o. 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 Re-intubated today, minimal neurologic recovery.   Assessment & Plan    Cardiac arrest: found by family pulseless, shaking. Felt 2/2 to VT/VF arrest, shocked by AED on the field. With recent STEMI, Dilated CM and presentation with cardiac arrest, he was taken back for cath. This showed possible edge dissection in circ, treated with an overlapping DES. He was placed on cooling protocol in the cath lab.   CAD: Status post PCI of circumflex. Continue aspirin, statin and brilinta. Restart low dose carvedilol.  Anoxic brain injury: neurology following; some improvement today. Neurology now feels his prognosis is better. They will try to extubate today again.    Chronic systolic HF/ischemic cardiomyopathy:  Levophed was just discontinued, he is hypertensive, I will restart low dose carvedilol 3.125 mg po BID. Continue low dose lasix 20 mg iv BID. Repeat CXR.   NSVT: No recent NSVT.  Ena Dawley, MD 06/04/2016

## 2016-06-05 ENCOUNTER — Inpatient Hospital Stay (HOSPITAL_COMMUNITY): Payer: BC Managed Care – PPO

## 2016-06-05 LAB — BASIC METABOLIC PANEL
ANION GAP: 9 (ref 5–15)
BUN: 23 mg/dL — ABNORMAL HIGH (ref 6–20)
CHLORIDE: 113 mmol/L — AB (ref 101–111)
CO2: 27 mmol/L (ref 22–32)
Calcium: 8.2 mg/dL — ABNORMAL LOW (ref 8.9–10.3)
Creatinine, Ser: 1 mg/dL (ref 0.61–1.24)
GFR calc Af Amer: >60 mL/min (ref >60)
GFR calc non Af Amer: >60 mL/min (ref >60)
Glucose, Bld: 136 mg/dL — ABNORMAL HIGH (ref 65–99)
POTASSIUM: 3.4 mmol/L — AB (ref 3.5–5.1)
Sodium: 149 mmol/L — ABNORMAL HIGH (ref 135–145)

## 2016-06-05 LAB — CULTURE, BLOOD (ROUTINE X 2)
CULTURE: NO GROWTH
CULTURE: NO GROWTH

## 2016-06-05 LAB — CBC
HCT: 36.3 % — ABNORMAL LOW (ref 39.0–52.0)
Hemoglobin: 11.3 g/dL — ABNORMAL LOW (ref 13.0–17.0)
MCH: 27.6 pg (ref 26.0–34.0)
MCHC: 31.1 g/dL (ref 30.0–36.0)
MCV: 88.8 fL (ref 78.0–100.0)
Platelets: 178 K/uL (ref 150–400)
RBC: 4.09 MIL/uL — ABNORMAL LOW (ref 4.22–5.81)
RDW: 13.3 % (ref 11.5–15.5)
WBC: 9.3 K/uL (ref 4.0–10.5)

## 2016-06-05 LAB — GLUCOSE, CAPILLARY
GLUCOSE-CAPILLARY: 136 mg/dL — AB (ref 65–99)
GLUCOSE-CAPILLARY: 133 mg/dL — AB (ref 65–99)
Glucose-Capillary: 132 mg/dL — ABNORMAL HIGH (ref 65–99)
Glucose-Capillary: 132 mg/dL — ABNORMAL HIGH (ref 65–99)
Glucose-Capillary: 139 mg/dL — ABNORMAL HIGH (ref 65–99)

## 2016-06-05 LAB — PHOSPHORUS: Phosphorus: 2.7 mg/dL (ref 2.5–4.6)

## 2016-06-05 LAB — MAGNESIUM: Magnesium: 2.4 mg/dL (ref 1.7–2.4)

## 2016-06-05 MED ORDER — ORAL CARE MOUTH RINSE
15.0000 mL | Freq: Two times a day (BID) | OROMUCOSAL | Status: DC
  Administered 2016-06-05 – 2016-06-17 (×22): 15 mL via OROMUCOSAL

## 2016-06-05 MED ORDER — JEVITY 1.2 CAL PO LIQD
1000.0000 mL | ORAL | Status: DC
  Administered 2016-06-05: 22:00:00
  Filled 2016-06-05 (×3): qty 1000

## 2016-06-05 MED ORDER — PNEUMOCOCCAL VAC POLYVALENT 25 MCG/0.5ML IJ INJ: 0.5000 mL | INJECTION | INTRAMUSCULAR | Status: DC | PRN

## 2016-06-05 MED ORDER — CHLORHEXIDINE GLUCONATE 0.12 % MT SOLN
15.0000 mL | Freq: Two times a day (BID) | OROMUCOSAL | Status: DC
  Administered 2016-06-05 – 2016-06-17 (×23): 15 mL via OROMUCOSAL
  Filled 2016-06-05 (×23): qty 15

## 2016-06-05 MED ORDER — LISINOPRIL 5 MG PO TABS
5.0000 mg | ORAL_TABLET | Freq: Every day | ORAL | Status: DC
  Administered 2016-06-05 – 2016-06-17 (×10): 5 mg via ORAL
  Filled 2016-06-05 (×11): qty 1

## 2016-06-05 MED ORDER — POTASSIUM CHLORIDE 20 MEQ/15ML (10%) PO SOLN
40.0000 meq | Freq: Three times a day (TID) | ORAL | Status: AC
  Administered 2016-06-05 (×2): 40 meq
  Filled 2016-06-05 (×2): qty 30

## 2016-06-05 NOTE — Progress Notes (Signed)
Pt got another NG tube placed, xray was obtained at 6pm. When placed it was at 95cm now it is at 97cm. Paged cardiology to see if it was able to be used. Given orders to use it.

## 2016-06-05 NOTE — Progress Notes (Signed)
Cortrak tube placed to 95 cm in pts right nare. Pt tolerated procedure well. Xray ordered, tube added to assessment. Please contact cortrak team with questions or concerns.

## 2016-06-05 NOTE — Progress Notes (Addendum)
PULMONARY / CRITICAL CARE MEDICINE   ADMISSION DATE:  05/24/2016 CONSULTATION DATE:  05/24/2016  REFERRING MD :  Dr. Herbie BaltimoreHarding  CHIEF COMPLAINT:  STEMI/Post arrest  BRIEF:  51 y/o male with known CAP and systolic CHF admitted on 1/20 with STEMI, cardiac arrest.  Has remained intubated since admission.  PCI placed to circ on admission.   SUBJECTIVE:  Extubated and doing well, wants water, no events overnight  VITAL SIGNS: Temp:  [98 F (36.7 C)-98.6 F (37 C)] 98 F (36.7 C) (02/01 0805) Pulse Rate:  [73-87] 82 (02/01 0800) Resp:  [11-28] 28 (02/01 0800) BP: (95-169)/(47-112) 166/73 (02/01 0800) SpO2:  [95 %-100 %] 97 % (02/01 0800) Weight:  [103.3 kg (227 lb 11.8 oz)] 103.3 kg (227 lb 11.8 oz) (02/01 0500) HEMODYNAMICS:   VENTILATOR SETTINGS:   INTAKE / OUTPUT:  Intake/Output Summary (Last 24 hours) at 06/05/16 1012 Last data filed at 06/05/16 0700  Gross per 24 hour  Intake              760 ml  Output             1300 ml  Net             -540 ml   PHYSICAL EXAMINATION: General: Well appearing, sitting up in chair HENT: [place]/AT, EOMI, PERRL PULM: CTA bilaterally CV: RRR, no m/g/r GI: BS+, soft, nontender MSK: moves all extremities spontaneously, no edema Neuro: Awake and interactive, moving all ext to command  LABS:  CBC  Recent Labs Lab 06/02/16 0227 06/03/16 0158 06/05/16 0216  WBC 12.2* 13.9* 9.3  HGB 10.8* 10.7* 11.3*  HCT 35.1* 35.2* 36.3*  PLT 164 194 178   Coag's No results for input(s): APTT, INR in the last 168 hours. BMET  Recent Labs Lab 06/03/16 0158 06/04/16 0458 06/05/16 0216  NA 152* 141 149*  K 4.0 3.6 3.4*  CL 117* 106 113*  CO2 28 24 27   BUN 31* 11 23*  CREATININE 1.02 0.74 1.00  GLUCOSE 173* 135* 136*   Electrolytes  Recent Labs Lab 05/31/16 0443  06/03/16 0158 06/04/16 0458 06/05/16 0216  CALCIUM 8.5*  < > 8.4* 8.3* 8.2*  MG 2.7*  --  2.8*  --  2.4  PHOS 4.3  --  3.5  --  2.7  < > = values in this interval not  displayed. Sepsis Markers No results for input(s): LATICACIDVEN, PROCALCITON, O2SATVEN in the last 168 hours. ABG  Recent Labs Lab 06/03/16 0322  PHART 7.476*  PCO2ART 37.4  PO2ART 50.0*   Liver Enzymes No results for input(s): AST, ALT, ALKPHOS, BILITOT, ALBUMIN in the last 168 hours. Cardiac Enzymes No results for input(s): TROPONINI, PROBNP in the last 168 hours. Glucose  Recent Labs Lab 06/04/16 1159 06/04/16 1558 06/04/16 2032 06/05/16 0030 06/05/16 0337 06/05/16 0803  GLUCAP 146* 119* 145* 139* 132* 132*   Imaging Dg Chest Port 1 View  Result Date: 06/04/2016 CLINICAL DATA:  History respiratory failure, followup EXAM: PORTABLE CHEST 1 VIEW COMPARISON:  Chest x-ray of 06/03/2016 and 06/02/2006, and CT chest of 04/12/2012 FINDINGS: The endotracheal tube has been removed. Aeration has improved slightly. Persistent bibasilar atelectasis is noted. Nodular opacities at the right lung base are due to calcified granulomas when compared to prior CT of the chest. Mediastinal and hilar contours are unremarkable. The heart is mildly enlarged. NG tube extends below the hemidiaphragm. IMPRESSION: 1. Slightly improved aeration with persistent bibasilar atelectasis. 2. Endotracheal tube removed. Electronically Signed  By: Dwyane Dee M.D.   On: 06/04/2016 13:02   Dg Abd Portable 1v  Result Date: 06/05/2016 CLINICAL DATA:  Impaired function of the esophagogastric tube. EXAM: PORTABLE ABDOMEN - 1 VIEW COMPARISON:  Abdominal radiograph of June 01, 2016 FINDINGS: The orogastric tube tip in proximal port appear to lie in the proximal jejunum. No gastric distention is observed. There is patchy density at the left lung base. IMPRESSION: Orogastric tube tip and proximal port lie in the expected location of the proximal jejunum. Clinical correlation as to the appropriateness of this positioning is needed. Withdrawal by 20 cm would place the tip of the tube in the second portion of the duodenum.  Electronically Signed   By: David  [place] M.D.   On: 06/05/2016 07:15   Lines/Tubes: ETT 1/20 >>1/28 L IJ CVL 1/20 >>1/28 ETT 1/29>>1/31  Cultures: Resp 1/28>> abundant staph aureus (MSSA), pansensitive  Blood 1/27>>  Antibiotics: Unasyn 1/21>>>1/23 Vanc 1/29>>1/31 Zosyn 1/29>>1/31 Ancef 1/31>>>  Studies: CT Head 1/20 >> negative CXR 1/20 >> Endotracheal tube in satisfactory position. Worsening bilateral interstitial opacities which may reflect edema or infection. CXR 1/21 >> Tip of the left central line in the proximal SVC. No Pneumothorax. Developing hazy opacity at the right lung base, likely pleural fluid. Worsening interstitial opacities may be increasing pulmonary edema, pneumonia, or ARDS. EEG 1/20 >> moderate to severe generalized slowing.  No epileptiform discharges or seizures.  Improved some relative to burst suppression pattern seen yesterday.  MRI 1/24>> Possible 4 mm acute infarct left thalamus. No other acute infarct. Mild chronic ischemia of the convexity bilaterally right greater than left. EEG 1/25>>mild to moderate diffuse generalized slowing, improved somewhat with sedation discontinued. Episodes of twitching on right side recorded but did not show seizure activity.  MRI 1/29>> Stable left thalamus 4 mm infarct. No new acute infarcts. EEG 1/29>> diffuse slowing, diffuse cerebral dysfunction that is non-specific   I reviewed AXR myself, NGT in good position.  ASSESSMENT / PLAN:  PULMONARY A: Acute respiratory failure with hypoxemia Chronic R side pulmonary nodule Extubated 1/28 Reintubated 1/29 due to not protecting airway MSSA in trach aspirate  P:   Titrate O2 for sat of 88-92% Stopped Vanc/Zosyn, switched to ancef 1/31 Monitor for airway protection  CARDIOVASCULAR A:  Cardiac arrest STEMI, s/p PCI to circumflex Systolic CHF Hypertension NSVT this admission P:  D/C levophed Anti-platelets, statin Tele Add low dose lisinopril, 5 mg daily D/C  lasix  RENAL A:   AKI resolved Hypokalemia- resolved Hypernatremia- resolved P:   Stopped free water Monitor BMET and UOP Replace electrolytes as needed  GASTROINTESTINAL A:   Nutrition needs Stress ulcer prophylaxis P:   NPO  SLP today Continue famotidine  HEMATOLOGIC A:   Leukocytosis Normocytic anemia- likely from acute illness P:  Continue sub q heparin  Monitor CBC intermittently   INFECTIOUS A:   Continued fevers and leukocytosis> resolving  MSSA in trach aspirate P:   Deescalate abx - Ancef, need a total of 8 days of abx Follow cultures  Continue APAP to treat fever aggressively  ENDOCRINE A:   DM2  hyperglycemia P:   Continue SSI  NEUROLOGIC A:   Acute encephalopathy> slow improvement Seizures 1/20  P:   Continue Keppra D/C other sedation  Discussed with PCCM-NP and TRH-MD, transfer to SDU and to [facility] service with PCCM off 2/2.  Alyson Reedy, M.D. [facility] Pulmonary/Critical Care Medicine. Pager: [phone]. After hours pager: [phone].  06/05/2016 10:12 AM

## 2016-06-05 NOTE — Evaluation (Signed)
Physical Therapy Evaluation Patient Details Name: Douglas Carr MRN: [id number] DOB: 02-14-1966 Today's Date: 06/05/2016   History of Present Illness  Pt is a 51 y/o male admitted secondary to STEMI at home with seizures. He is s/p PCI to circumflex. Pt also with an anoxic brain injury. Of note, pt recently admitted on 05/07/16-05/11/16 for an MI. PMH including but not limited to CHF, HTN, CAD, DM and PVD.  Clinical Impression  Pt presented supine in bed with HOB elevated, awake and willing to participate in therapy session. Prior to admission, pt's spouse stated that pt was independent with all functional mobility and ADLs. Pt currently requires max A for bed mobility, mod A x2 for sit-to-stand and total A x2 for pivotal movements to recliner. Pt with very slow processing throughout and other cognitive deficits (see below). All VSS throughout. Pt would continue to benefit from skilled physical therapy services at this time while admitted and after d/c to address his below listed limitations in order to improve his overall safety and independence with functional mobility.     Follow Up Recommendations SNF;Supervision/Assistance - 24 hour    Equipment Recommendations  None recommended by PT;Other (comment) (defer to next venue)    Recommendations for Other Services       Precautions / Restrictions Precautions Precautions: Fall Restrictions Weight Bearing Restrictions: No      Mobility  Bed Mobility Overal bed mobility: Needs Assistance Bed Mobility: Supine to Sit     Supine to sit: Max assist;HOB elevated     General bed mobility comments: pt required increased time, veral and tactile cues for sequencing and mod A with bilateral LEs and max A at trunk to achieve sitting EOB with use of bed pads to position hips.  Transfers Overall transfer level: Needs assistance Equipment used: 2 person hand held assist Transfers: Sit to/from [employer] Sit to Stand: Mod  assist;+2 physical assistance Stand pivot transfers: Total assist;+2 physical assistance       General transfer comment: pt required increased time, verbal and tactile cues for bilateral hand placement, mod A x2 to power up into standing and total A x2 for pivotal movements to recliner.  Ambulation/Gait                Stairs            Wheelchair Mobility    Modified Rankin (Stroke Patients Only)       Balance Overall balance assessment: Needs assistance Sitting-balance support: Feet supported;Bilateral upper extremity supported Sitting balance-Leahy Scale: Poor Sitting balance - Comments: pt reliant on bilateral UE supports and min A   Standing balance support: During functional activity Standing balance-Leahy Scale: Poor Standing balance comment: pt required mod A x2 to maintain upright standing                             Pertinent Vitals/Pain Pain Assessment: Faces Pain Score: 0-No pain Pain Intervention(s): Monitored during session    Home Living Family/patient expects to be discharged to:: Skilled nursing facility                 Additional Comments: pt lives at home with wife. His wife is expecting that he will need some short term rehab prior to returning home.    Prior Function Level of Independence: Independent               Hand Dominance  Extremity/Trunk Assessment   Upper Extremity Assessment Upper Extremity Assessment: RUE deficits/detail RUE Deficits / Details: pt with decreased strength in R UE as compared to L. Pt with 2/5 for shoulder flexion and elbow flexion. Pt with very weak grip. Sensation intact.    Lower Extremity Assessment Lower Extremity Assessment: Generalized weakness       Communication   Communication: No difficulties  Cognition Arousal/Alertness: Awake/alert Behavior During Therapy: WFL for tasks assessed/performed Overall Cognitive Status: Impaired/Different from baseline Area  of Impairment: Attention;Memory;Following commands;Safety/judgement;Awareness;Problem solving   Current Attention Level: Sustained Memory: Decreased short-term memory Following Commands: Follows one step commands with increased time Safety/Judgement: Decreased awareness of safety;Decreased awareness of deficits   Problem Solving: Slow processing;Decreased initiation;Difficulty sequencing;Requires verbal cues;Requires tactile cues      General Comments      Exercises     Assessment/Plan    PT Assessment Patient needs continued PT services  PT Problem List Decreased strength;Decreased range of motion;Decreased activity tolerance;Decreased balance;Decreased mobility;Decreased coordination;Decreased cognition;Decreased knowledge of use of DME;Decreased safety awareness;Cardiopulmonary status limiting activity          PT Treatment Interventions DME instruction;Gait training;Stair training;Functional mobility training;Therapeutic activities;Therapeutic exercise;Balance training;Neuromuscular re-education;Cognitive remediation;Patient/family education    PT Goals (Current goals can be found in the Care Plan section)  Acute Rehab PT Goals Patient Stated Goal: return home PT Goal Formulation: With patient/family Time For Goal Achievement: 06/19/16 Potential to Achieve Goals: Fair    Frequency Min 3X/week   Barriers to discharge        Co-evaluation               End of Session Equipment Utilized During Treatment: Gait belt Activity Tolerance: Patient tolerated treatment well Patient left: in chair;with call bell/phone within reach;with chair alarm set;with family/visitor present Nurse Communication: Mobility status         Time: [id number] PT Time Calculation (min) (ACUTE ONLY): 24 min   Charges:   PT Evaluation $PT Eval Moderate Complexity: 1 Procedure PT Treatments $Therapeutic Activity: 8-22 mins   PT G CodesAlessandra Bevels:        Cedrik Heindl M Jary Louvier 06/05/2016, 10:38  AM Deborah ChalkJennifer Docie Abramovich, PT, DPT [phone]

## 2016-06-05 NOTE — Progress Notes (Signed)
Patient Name: VIN YONKE Date of Encounter: 06/05/2016  Primary Cardiologist: Dr. Serena Croissant Problem List     Principal Problem:   Cardiac arrest with ventricular fibrillation [facility]) Active Problems:   Essential hypertension   Status post coronary artery stent placement   Dilated cardiomyopathy ([place])   Coronary artery disease involving native coronary artery of native heart with angina pectoris ([place])   ST elevation myocardial infarction (STEMI) ([place])   Acute encephalopathy   Cardiac arrest ([place])   Anoxic brain injury ([place])   Anoxic encephalopathy ([place])   Seizures ([place])   Goals of care, counseling/discussion   Palliative care encounter   Central venous catheter in place   Encounter for hospice care discussion   Palliative care by specialist   Ventilator dependent ([place])   Acute respiratory failure with hypoxia ([place])   Myoclonus   Anoxia   Diabetes mellitus type 2 in obese ([place])   Aspiration pneumonia ([place])   Acute on chronic combined systolic and diastolic CHF (congestive heart failure) ([facility])   Fever   Hypernatremia   Leukocytosis   Acute blood loss anemia     Subjective   The patient was extubated yesterday, shows some neurologic recovery, follows commands.    Inpatient Medications    Scheduled Meds: . aspirin  81 mg Per Tube Daily  . atorvastatin  80 mg Per Tube q1800  . carvedilol  3.125 mg Oral BID WC  .  ceFAZolin (ANCEF) IV  2 g Intravenous Q8H  . chlorhexidine gluconate (MEDLINE KIT)  15 mL Mouth Rinse BID  . docusate  100 mg Per Tube BID  . famotidine  20 mg Oral Daily  . heparin  5,000 Units Subcutaneous Q8H  . levETIRAcetam  1,000 mg Intravenous Q12H  . lisinopril  5 mg Oral Daily  . mouth rinse  15 mL Mouth Rinse 10 times per day  . multivitamin with minerals  1 tablet Oral Daily  . potassium chloride  40 mEq Per Tube TID  . sennosides  5 mL Per Tube BID  . ticagrelor  90 mg Oral BID   Continuous Infusions:  PRN Meds: sodium  chloride, acetaminophen, acetaminophen, bisacodyl, ondansetron (ZOFRAN) IV   Vital Signs    Vitals:   06/05/16 0800 06/05/16 0805 06/05/16 0900 06/05/16 1000  BP: (!) 166/73  (!) 164/75 107/82  Pulse: 82  81 87  Resp: (!) 28  18 18   Temp:  98 F (36.7 C)    TempSrc:  Axillary    SpO2: 97%  99% 97%  Weight:      Height:        Intake/Output Summary (Last 24 hours) at 06/05/16 1042 Last data filed at 06/05/16 1000  Gross per 24 hour  Intake              790 ml  Output             1300 ml  Net             -510 ml   Filed Weights   06/01/16 0327 06/02/16 0429 06/05/16 0500  Weight: 227 lb 15.3 oz (103.4 kg) 219 lb 12.8 oz (99.7 kg) 227 lb 11.8 oz (103.3 kg)   Physical Exam    GEN:WD on vent; opens eyes and tracks HEENT: normal Neck: supple Cardiac:RRR Respiratory:CTA VO:HYWV, non-distended  MS:No edema; No deformity. Neuro: Opens eye to voice and tracks  Labs    CBC  Recent Labs  06/03/16 0158 06/05/16 0216  WBC 13.9* 9.3  HGB 10.7* 11.3*  HCT 35.2* 36.3*  MCV 90.5 88.8  PLT 194 845   Basic Metabolic Panel  Recent Labs  06/03/16 0158 06/04/16 0458 06/05/16 0216  NA 152* 141 149*  K 4.0 3.6 3.4*  CL 117* 106 113*  CO2 28 24 27   GLUCOSE 173* 135* 136*  BUN 31* 11 23*  CREATININE 1.02 0.74 1.00  CALCIUM 8.4* 8.3* 8.2*  MG 2.8*  --  2.4  PHOS 3.5  --  2.7   Fasting Lipid Panel  Recent Labs  06/02/16 1511  TRIG 120   Telemetry    NSR with occasional PVC - Personally Reviewed  Radiology    Dg Chest Port 1 View  Result Date: 06/04/2016 CLINICAL DATA:  History respiratory failure, followup EXAM: PORTABLE CHEST 1 VIEW COMPARISON:  Chest x-ray of 06/03/2016 and 06/02/2006, and CT chest of 04/12/2012 FINDINGS: The endotracheal tube has been removed. Aeration has improved slightly. Persistent bibasilar atelectasis is noted. Nodular opacities at the right lung base are due to calcified granulomas when compared to prior CT of the chest.  Mediastinal and hilar contours are unremarkable. The heart is mildly enlarged. NG tube extends below the hemidiaphragm. IMPRESSION: 1. Slightly improved aeration with persistent bibasilar atelectasis. 2. Endotracheal tube removed. Electronically Signed   By: Ivar Drape M.D.   On: 06/04/2016 13:02   Dg Abd Portable 1v  Result Date: 06/05/2016 CLINICAL DATA:  Impaired function of the esophagogastric tube. EXAM: PORTABLE ABDOMEN - 1 VIEW COMPARISON:  Abdominal radiograph of June 01, 2016 FINDINGS: The orogastric tube tip in proximal port appear to lie in the proximal jejunum. No gastric distention is observed. There is patchy density at the left lung base. IMPRESSION: Orogastric tube tip and proximal port lie in the expected location of the proximal jejunum. Clinical correlation as to the appropriateness of this positioning is needed. Withdrawal by 20 cm would place the tip of the tube in the second portion of the duodenum. Electronically Signed   By: David  Martinique M.D.   On: 06/05/2016 07:15    Cardiac Studies   Cardiac Cath: 07/08/44   LV end diastolic pressure is severely elevated.  Ost LAD to Prox LAD lesion, 30 %stenosed.  Ost 2nd Diag to 2nd Diag lesion, 50 %stenosed.  Ost 1st Diag to 1st Diag lesion, 70 %stenosed.  Ost Cx to Prox Cx lesion, 50 %stenosed.  Ost 1st Mrg to 1st Mrg lesion, 35 %stenosed.  2nd Mrg lesion, 90 %stenosed.  A STENT PROMUS PREM MR 2.5X24 drug eluting stent was successfully placed.  Post intervention, there is a 0% residual stenosis.  Prox RCA to Mid RCA lesion, 50 %stenosed.  RPDA lesion, 100 %stenosed.  1. Diffuse coronary artery disease with relatively small diabetic vessels.  2. Severe segmental stenosis in the second OM. Lesion is hazy and ulcerative. This is the culprit lesion 3. Severely elevated LVEDP 4. Successful stenting of the second OM with DES.  Plan: DAPT for one year. Aggressive risk factor modification. Will treat with IV lasix for  elevated LVEDP. Add ARB, beta blocker as tolerated. Assess LV function with Echo. Trend serial troponins and ECG.      Cath 05/24/16  Ost Cx to Mid Cx lesion, 70 %stenosed leading into OM2 as the main trunk of the Circumflex.  Ost 2nd Mrg to 2nd Mrg recent Promus DES 2.5 x 24 stent, - focal 90 %stenosed with what appears to be proximal edge dissection with thrombus  A STENT  PROMUS PREM MR 2.5X20 drug eluting stent was successfully placed from Ostium of Circumflex into OM2, and overlaps previously placed stent.  Post intervention, there is a 0% residual stenosis.  ____________________________________________________  Colon Flattery LAD to Prox LAD lesion, 30 %stenosed.  Ost 1st Diag to 1st Diag lesion, 70 %stenosed.  Ost 1st Mrg to 1st Mrg lesion, 35 %stenosed. Ost 2nd Diag to 2nd Diag lesion, 50 %stenosed.  Prox RCA to Mid RCA lesion, 65 %stenosed - lesion appears similar to prior cath, with mild progression.  Very distal RPDA lesion, 100 %stenosed.  LV end diastolic pressure is severely elevated.  There is no aortic valve stenosis.   Status post what is most likely VF or VT arrest 2 weeks post STEMI with PCI to the circumflex.  His EKG itself did not show signs of ST elevation, however given his recent STEMI, known cardiac myopathy and presentation with cardiac arrest, I felt it prudent taken to the Cath Lab.  Angiographically there appeared to be a possible proximal edge dissection with thrombus in this lesion, however there is TIMI 2 flow. This lesion was treated with an overlapping proximal stent up to the ostium of the circumflex and OM 2. Angiographically there was good result. It is quite possible at least 1 and strut is into the left main, but non flow-limiting.  Plan:  He'll be admitted to the CCU on hypothermia protocol per PCCM  Continue amiodarone overnight as he appears to have converted into sinus rhythm  Hold ARB and Imdur for now. We'll try to start beta  blocker.  Cardiology will follow along while on cooling protocol.   ECHO 05/15/16 - Left ventricle: The cavity size was moderately dilated. Wall thickness was normal. Systolic function was severely reduced. The estimated ejection fraction was in the range of 25% to 30%. Akinesis and scarring of the anteroseptal and anterior myocardium; consistent with infarction in the distribution of the left anterior descending coronary artery. Dyskinesis of the apicalanterior myocardium. Features are consistent with a pseudonormal left ventricular filling pattern, with concomitant abnormal relaxation and increased filling pressure (grade 2 diastolic dysfunction). No evidence of thrombus. - Left atrium: The atrium was mildly dilated. - Atrial septum: No defect or patent foramen ovale was identified.    Patient Profile     51 y.o. 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 Re-intubated today, minimal neurologic recovery.   Assessment & Plan    Cardiac arrest: found by family pulseless. Felt 2/2 to VT/VF arrest, shocked by AED on the field. With recent STEMI, Dilated CM and presentation with cardiac arrest, he was taken back for cath. This showed possible edge dissection in circ, treated with an overlapping DES. He was placed on cooling protocol in the cath lab. Now extubated, with some neurologic recovery, up in the chair today.  CAD: Status post PCI of circumflex. Continue aspirin, statin and brilinta. Restarted low dose carvedilol. In SR, no arhhythmias.  Anoxic brain injury: neurology following; some improvement today. Neurology now feels his prognosis is better. They will try to extubate today again.    Chronic systolic HF/ischemic cardiomyopathy:  Levophed was just discontinued, he is hypertensive, On carvedilol 3.125 mg po BID. Continue low dose  lasix 20 mg iv BID. Replace K.   NSVT: No recent NSVT.  Ena Dawley, MD 06/05/2016

## 2016-06-05 NOTE — Progress Notes (Signed)
Speech Language Pathology Treatment: Dysphagia  Patient Details Name: Margarita SermonsClifford M Verge MRN: [id number] DOB: Oct 17, 1965 Today's Date: 06/05/2016 Time: [id number] SLP Time Calculation (min) (ACUTE ONLY): 12 min  Assessment / Plan / Recommendation Clinical Impression  Skilled observation with ice chips after aggressive oral care provided; pt with delayed/immediate cough following 3 trials of ice chips requiring oral suctioning; pt needed suctioning prior to ice chip presentation d/t oral secretions present (requested suctioning); discussed in depth risk of aspiration despite pt eagerness to eat by mouth; will continue to monitor for po readiness; Cortrak placed on 06/04/16; continue NPO   HPI HPI: [name] is a 51 yo male with PMH of CAD(recently admitted from 1/3-1/7 for MI, had PCI of Om2), HFrEF (EF 35%), DMII (on insulin), HLD and carotid disease. He was discharged home from that admission, but returned shortly there after with 8lb weight gain, worsening dyspnea and decreased UOP despite full compliance with his outpatient diuretic. His BNP was 610 and weight was increased from 239-246lbs.        SLP Plan  Continue with current plan of care     Recommendations  Diet recommendations: NPO Medication Administration: Via alternative means                Oral Care Recommendations: Oral care QID Follow up Recommendations: Other (comment) Plan: Continue with current plan of care                       Saron Tweed,PAT, M.S., CCC-SLP 06/05/2016, 5:08 PM

## 2016-06-05 NOTE — Progress Notes (Signed)
Nutrition Follow-up  DOCUMENTATION CODES:   Obesity unspecified  INTERVENTION:   If pt remains NPO recommend: Jevity 1.2 @ 70 ml/hr (1680 ml/day) 30 ml Prostat daily Provides: 2116 kcal, 108 grams protein, and 1362 ml H2O.   As diet is advanced will supplement as needed.   NUTRITION DIAGNOSIS:   Inadequate oral intake related to inability to eat as evidenced by NPO status. Ongoing.   GOAL:   Provide needs based on ASPEN/SCCM guidelines Not met.   MONITOR:   TF tolerance, I & O's, Vent status  ASSESSMENT:   51 yo male with PMH of CAD(recently admitted from 1/3-1/23fr MI, had PCI of Om2), HFrEF (EF 35%), DMII (on insulin), HLD and carotid disease. Pt admitted with OOH arrest, now with onset seizures.   1/31 extubated, failed swallow eval Awaiting repeat swallow eval. Spoke with pt who is ready to eat and drink. Pt does have a NG tube in place if needed. Spoke with RN, TF recommendations available as needed.    Diet Order:    NPO  Skin:  Reviewed, no issues  Last BM:  1/31  Height:   Ht Readings from Last 1 Encounters:  05/24/16 5' 9"  (1.753 m)    Weight:   Wt Readings from Last 1 Encounters:  06/05/16 227 lb 11.8 oz (103.3 kg)    Ideal Body Weight:  72.7 kg  BMI:  Body mass index is 33.63 kg/m.  Estimated Nutritional Needs:   Kcal:  2000-2200  Protein:  105-115 grams  Fluid:  2 L/day  EDUCATION NEEDS:   No education needs identified at this time  HGaithersburg LLindenhurst CEast TawakoniPager [phone] Hours Pager

## 2016-06-06 ENCOUNTER — Encounter (HOSPITAL_COMMUNITY): Payer: Self-pay | Admitting: General Practice

## 2016-06-06 ENCOUNTER — Inpatient Hospital Stay (HOSPITAL_COMMUNITY): Payer: BC Managed Care – PPO

## 2016-06-06 LAB — CBC
HCT: 38 % — ABNORMAL LOW (ref 39.0–52.0)
HEMOGLOBIN: 11.9 g/dL — AB (ref 13.0–17.0)
MCH: 27.5 pg (ref 26.0–34.0)
MCHC: 31.3 g/dL (ref 30.0–36.0)
MCV: 88 fL (ref 78.0–100.0)
PLATELETS: 226 10*3/uL (ref 150–400)
RBC: 4.32 MIL/uL (ref 4.22–5.81)
RDW: 12.9 % (ref 11.5–15.5)
WBC: 10.5 10*3/uL (ref 4.0–10.5)

## 2016-06-06 LAB — GLUCOSE, CAPILLARY
GLUCOSE-CAPILLARY: 160 mg/dL — AB (ref 65–99)
GLUCOSE-CAPILLARY: 180 mg/dL — AB (ref 65–99)
GLUCOSE-CAPILLARY: 243 mg/dL — AB (ref 65–99)
Glucose-Capillary: 207 mg/dL — ABNORMAL HIGH (ref 65–99)
Glucose-Capillary: 228 mg/dL — ABNORMAL HIGH (ref 65–99)
Glucose-Capillary: 191 mg/dL — ABNORMAL HIGH (ref 65–99)

## 2016-06-06 LAB — BASIC METABOLIC PANEL
ANION GAP: 12 (ref 5–15)
BUN: 21 mg/dL — AB (ref 6–20)
CHLORIDE: 114 mmol/L — AB (ref 101–111)
CO2: 24 mmol/L (ref 22–32)
Calcium: 8.3 mg/dL — ABNORMAL LOW (ref 8.9–10.3)
Creatinine, Ser: 0.97 mg/dL (ref 0.61–1.24)
GFR calc Af Amer: >60 mL/min (ref >60)
Glucose, Bld: 209 mg/dL — ABNORMAL HIGH (ref 65–99)
POTASSIUM: 3.7 mmol/L (ref 3.5–5.1)
SODIUM: 150 mmol/L — AB (ref 135–145)

## 2016-06-06 LAB — MAGNESIUM: MAGNESIUM: 2.4 mg/dL (ref 1.7–2.4)

## 2016-06-06 LAB — PHOSPHORUS: PHOSPHORUS: 2.8 mg/dL (ref 2.5–4.6)

## 2016-06-06 MED ORDER — FREE WATER
125.0000 mL | Status: DC
  Administered 2016-06-06 – 2016-06-07 (×5): 125 mL

## 2016-06-06 MED ORDER — OSMOLITE 1.2 CAL PO LIQD
1000.0000 mL | ORAL | Status: DC
Start: 2016-06-06 — End: 2016-06-13
  Administered 2016-06-06 – 2016-06-10 (×2): 1000 mL
  Filled 2016-06-06 (×14): qty 1000

## 2016-06-06 MED ORDER — INSULIN ASPART 100 UNIT/ML ~~LOC~~ SOLN
0.0000 [IU] | SUBCUTANEOUS | Status: DC
  Administered 2016-06-06: 3 [IU] via SUBCUTANEOUS
  Administered 2016-06-07 (×2): 5 [IU] via SUBCUTANEOUS
  Administered 2016-06-07: 3 [IU] via SUBCUTANEOUS
  Administered 2016-06-07 (×2): 8 [IU] via SUBCUTANEOUS
  Administered 2016-06-07: 5 [IU] via SUBCUTANEOUS
  Administered 2016-06-09: 3 [IU] via SUBCUTANEOUS
  Administered 2016-06-10 (×3): 5 [IU] via SUBCUTANEOUS
  Administered 2016-06-10 (×3): 8 [IU] via SUBCUTANEOUS
  Administered 2016-06-11: 5 [IU] via SUBCUTANEOUS
  Administered 2016-06-11: 8 [IU] via SUBCUTANEOUS
  Administered 2016-06-12: 3 [IU] via SUBCUTANEOUS
  Administered 2016-06-12: 5 [IU] via SUBCUTANEOUS
  Administered 2016-06-12: 3 [IU] via SUBCUTANEOUS
  Administered 2016-06-12: 5 [IU] via SUBCUTANEOUS
  Administered 2016-06-13 (×3): 2 [IU] via SUBCUTANEOUS
  Administered 2016-06-13 – 2016-06-14 (×2): 3 [IU] via SUBCUTANEOUS
  Administered 2016-06-14: 5 [IU] via SUBCUTANEOUS
  Administered 2016-06-14 – 2016-06-15 (×5): 3 [IU] via SUBCUTANEOUS
  Administered 2016-06-15: 5 [IU] via SUBCUTANEOUS
  Administered 2016-06-15 (×2): 3 [IU] via SUBCUTANEOUS
  Administered 2016-06-15: 5 [IU] via SUBCUTANEOUS
  Administered 2016-06-16 (×2): 3 [IU] via SUBCUTANEOUS
  Administered 2016-06-16: 5 [IU] via SUBCUTANEOUS
  Administered 2016-06-16: 3 [IU] via SUBCUTANEOUS
  Administered 2016-06-16 (×2): 5 [IU] via SUBCUTANEOUS
  Administered 2016-06-17: 2 [IU] via SUBCUTANEOUS
  Administered 2016-06-17: 5 [IU] via SUBCUTANEOUS
  Administered 2016-06-17: 3 [IU] via SUBCUTANEOUS

## 2016-06-06 MED ORDER — INSULIN ASPART 100 UNIT/ML ~~LOC~~ SOLN
0.0000 [IU] | Freq: Three times a day (TID) | SUBCUTANEOUS | Status: DC
  Administered 2016-06-06: 5 [IU] via SUBCUTANEOUS

## 2016-06-06 MED ORDER — JEVITY 1.2 CAL PO LIQD
1000.0000 mL | ORAL | Status: DC
  Filled 2016-06-06 (×3): qty 1000

## 2016-06-06 MED ORDER — INSULIN ASPART 100 UNIT/ML ~~LOC~~ SOLN: 0.0000 [IU] | Freq: Every day | SUBCUTANEOUS | Status: DC

## 2016-06-06 MED ORDER — CEFAZOLIN SODIUM-DEXTROSE 2-4 GM/100ML-% IV SOLN
2.0000 g | Freq: Three times a day (TID) | INTRAVENOUS | Status: AC
Start: 2016-06-06 — End: 2016-06-10
  Administered 2016-06-06 – 2016-06-10 (×10): 2 g via INTRAVENOUS
  Filled 2016-06-06 (×11): qty 100

## 2016-06-06 MED ORDER — PRO-STAT SUGAR FREE PO LIQD
30.0000 mL | Freq: Every day | ORAL | Status: DC
  Administered 2016-06-06 – 2016-06-10 (×3): 30 mL
  Filled 2016-06-06 (×3): qty 30

## 2016-06-06 NOTE — Progress Notes (Addendum)
Inpatient Diabetes Program Recommendations  AACE/ADA: New Consensus Statement on Inpatient Glycemic Control (2015)  Target Ranges:  Prepandial:   less than 140 mg/dL      Peak postprandial:   less than 180 mg/dL (1-2 hours)      Critically ill patients:  140 - 180 mg/dL   Lab Results  Component Value Date   GLUCAP 243 (H) 06/06/2016   HGBA1C 9.3 (H) 05/07/2016   Results for Douglas Carr, Douglas M (MRN [id number]) as of 06/06/2016 14:27  Ref. Range 06/05/2016 08:03 06/05/2016 16:41 06/05/2016 20:15 06/06/2016 00:53 06/06/2016 04:05 06/06/2016 07:46 06/06/2016 11:47  Glucose-Capillary Latest Ref Range: 65 - 99 mg/dL 086132 (H) 578136 (H) 469133 (H) 160 (H) 180 (H)  TF Initiated 207 (H) 243 (H)   Review of Glycemic Control  Current orders for Inpatient glycemic control:     Initiated TF    Moderate correction scale Novolog 0-15 units TIDAC and 0-5 units QHS  Inpatient Diabetes Program Recommendations:    Please consider TF coverage Novolog 4 units Q4H while receiving tube feedings (hold if TF held).    While receiving TF, please consider changing Moderate correction scale frequency to Novolog 0-15 units Q4H.  Thank you,  Kristine LineaKaren Jafar Poffenberger, RN, MSN Diabetes Coordinator Inpatient Diabetes Program [phone] (Team Pager)

## 2016-06-06 NOTE — Progress Notes (Signed)
Progress Note  Patient Name: Douglas Carr Date of Encounter: 06/06/2016  Primary Cardiologist: Dr. Allyson SabalBerry  Subjective   Started r sided muscle spasm since yesterday. NF tube placed. Laying in bed. Friend at bed side.  Inpatient Medications    Scheduled Meds: . aspirin  81 mg Per Tube Daily  . atorvastatin  80 mg Per Tube q1800  . carvedilol  3.125 mg Oral BID WC  .  ceFAZolin (ANCEF) IV  2 g Intravenous Q8H  . chlorhexidine  15 mL Mouth Rinse BID  . docusate  100 mg Per Tube BID  . famotidine  20 mg Oral Daily  . heparin  5,000 Units Subcutaneous Q8H  . levETIRAcetam  1,000 mg Intravenous Q12H  . lisinopril  5 mg Oral Daily  . mouth rinse  15 mL Mouth Rinse q12n4p  . multivitamin with minerals  1 tablet Oral Daily  . sennosides  5 mL Per Tube BID  . ticagrelor  90 mg Oral BID   Continuous Infusions: . feeding supplement (JEVITY 1.2 CAL) 1,000 mL (06/06/16 0400)   PRN Meds: sodium chloride, acetaminophen, acetaminophen, bisacodyl, ondansetron (ZOFRAN) IV, pneumococcal 23 valent vaccine   Vital Signs    Vitals:   06/05/16 1816 06/05/16 2201 06/06/16 0055 06/06/16 0401  BP: (!) 169/92 (!) 166/79 (!) 122/54 (!) 142/70  Pulse:  80 78 83  Resp:   17 17  Temp: 98.7 F (37.1 C)  97.6 F (36.4 C) 98.2 F (36.8 C)  TempSrc: Oral  Axillary Axillary  SpO2: 94%  94% 95%  Weight:    205 lb (93 kg)  Height:        Intake/Output Summary (Last 24 hours) at 06/06/16 0909 Last data filed at 06/06/16 0600  Gross per 24 hour  Intake           697.67 ml  Output             1050 ml  Net          -352.33 ml   Filed Weights   06/02/16 0429 06/05/16 0500 06/06/16 0401  Weight: 219 lb 12.8 oz (99.7 kg) 227 lb 11.8 oz (103.3 kg) 205 lb (93 kg)    Telemetry    NSr - Personally Reviewed  ECG    N/A  Physical Exam   GEN: No acute distress.  Laying in bed with noted myoclonus.  Neck: No JVD Cardiac: RRR, no murmurs, rubs, or gallops.  Respiratory: Clear to  auscultation bilaterally. GI: Soft, nontender, non-distended  MS: No edema; No deformity. Neuro:  Nonfocal  Psych: Normal affect   Labs    Chemistry Recent Labs Lab 06/04/16 0458 06/05/16 0216 06/06/16 0544  NA 141 149* 150*  K 3.6 3.4* 3.7  CL 106 113* 114*  CO2 24 27 24   GLUCOSE 135* 136* 209*  BUN 11 23* 21*  CREATININE 0.74 1.00 0.97  CALCIUM 8.3* 8.2* 8.3*  GFRNONAA >60 >60 >60  GFRAA >60 >60 >60  ANIONGAP 11 9 12      Hematology Recent Labs Lab 06/03/16 0158 06/05/16 0216 06/06/16 0544  WBC 13.9* 9.3 10.5  RBC 3.89* 4.09* 4.32  HGB 10.7* 11.3* 11.9*  HCT 35.2* 36.3* 38.0*  MCV 90.5 88.8 88.0  MCH 27.5 27.6 27.5  MCHC 30.4 31.1 31.3  RDW 13.4 13.3 12.9  PLT 194 178 226    Cardiac EnzymesNo results for input(s): TROPONINI in the last 168 hours. No results for input(s): TROPIPOC in the last 168 hours.  BNPNo results for input(s): BNP, PROBNP in the last 168 hours.   DDimer No results for input(s): DDIMER in the last 168 hours.   Radiology    Dg Chest Port 1 View  Result Date: 06/04/2016 CLINICAL DATA:  History respiratory failure, followup EXAM: PORTABLE CHEST 1 VIEW COMPARISON:  Chest x-ray of 06/03/2016 and 06/02/2006, and CT chest of 04/12/2012 FINDINGS: The endotracheal tube has been removed. Aeration has improved slightly. Persistent bibasilar atelectasis is noted. Nodular opacities at the right lung base are due to calcified granulomas when compared to prior CT of the chest. Mediastinal and hilar contours are unremarkable. The heart is mildly enlarged. NG tube extends below the hemidiaphragm. IMPRESSION: 1. Slightly improved aeration with persistent bibasilar atelectasis. 2. Endotracheal tube removed. Electronically Signed   By: Dwyane Dee M.D.   On: 06/04/2016 13:02   Dg Abd Portable 1v  Result Date: 06/05/2016 CLINICAL DATA:  Feeding tube placement EXAM: PORTABLE ABDOMEN - 1 VIEW COMPARISON:  06/05/2016 head 04:48 FINDINGS: The enteric tube extends  through the stomach and duodenum, with tip in the region of the proximal jejunum. Visible portions of the abdominal gas pattern are unremarkable. IMPRESSION: Enteric tube extends to the proximal jejunum. Electronically Signed   By: Ellery Plunk M.D.   On: 06/05/2016 19:21   Dg Abd Portable 1v  Result Date: 06/05/2016 CLINICAL DATA:  Impaired function of the esophagogastric tube. EXAM: PORTABLE ABDOMEN - 1 VIEW COMPARISON:  Abdominal radiograph of June 01, 2016 FINDINGS: The orogastric tube tip in proximal port appear to lie in the proximal jejunum. No gastric distention is observed. There is patchy density at the left lung base. IMPRESSION: Orogastric tube tip and proximal port lie in the expected location of the proximal jejunum. Clinical correlation as to the appropriateness of this positioning is needed. Withdrawal by 20 cm would place the tip of the tube in the second portion of the duodenum. Electronically Signed   By: David  [place] M.D.   On: 06/05/2016 07:15    Cardiac Studies   Cardiac Cath: 05/07/16   LV end diastolic pressure is severely elevated.  Ost LAD to Prox LAD lesion, 30 %stenosed.  Ost 2nd Diag to 2nd Diag lesion, 50 %stenosed.  Ost 1st Diag to 1st Diag lesion, 70 %stenosed.  Ost Cx to Prox Cx lesion, 50 %stenosed.  Ost 1st Mrg to 1st Mrg lesion, 35 %stenosed.  2nd Mrg lesion, 90 %stenosed.  A STENT PROMUS PREM MR 2.5X24 drug eluting stent was successfully placed.  Post intervention, there is a 0% residual stenosis.  Prox RCA to Mid RCA lesion, 50 %stenosed.  RPDA lesion, 100 %stenosed.  1. Diffuse coronary artery disease with relatively small diabetic vessels.  2. Severe segmental stenosis in the second OM. Lesion is hazy and ulcerative. This is the culprit lesion 3. Severely elevated LVEDP 4. Successful stenting of the second OM with DES.  Plan: DAPT for one year. Aggressive risk factor modification. Will treat with IV lasix for elevated LVEDP. Add  ARB, beta blocker as tolerated. Assess LV function with Echo. Trend serial troponins and ECG.      Cath 05/24/16  Ost Cx to Mid Cx lesion, 70 %stenosed leading into OM2 as the main trunk of the Circumflex.  Ost 2nd Mrg to 2nd Mrg recent Promus DES 2.5 x 24 stent, - focal 90 %stenosed with what appears to be proximal edge dissection with thrombus  A STENT PROMUS PREM MR 2.5X20 drug eluting stent was successfully placed from Ostium of  Circumflex into OM2, and overlaps previously placed stent.  Post intervention, there is a 0% residual stenosis.  ____________________________________________________  Suezanne Jacquet LAD to Prox LAD lesion, 30 %stenosed.  Ost 1st Diag to 1st Diag lesion, 70 %stenosed.  Ost 1st Mrg to 1st Mrg lesion, 35 %stenosed. Ost 2nd Diag to 2nd Diag lesion, 50 %stenosed.  Prox RCA to Mid RCA lesion, 65 %stenosed - lesion appears similar to prior cath, with mild progression.  Very distal RPDA lesion, 100 %stenosed.  LV end diastolic pressure is severely elevated.  There is no aortic valve stenosis.   Status post what is most likely VF or VT arrest 2 weeks post STEMI with PCI to the circumflex.  His EKG itself did not show signs of ST elevation, however given his recent STEMI, known cardiac myopathy and presentation with cardiac arrest, I felt it prudent taken to the Cath Lab.  Angiographically there appeared to be a possible proximal edge dissection with thrombus in this lesion, however there is TIMI 2 flow. This lesion was treated with an overlapping proximal stent up to the ostium of the circumflex and OM 2. Angiographically there was good result. It is quite possible at least 1 and strut is into the left main, but non flow-limiting.  Plan:  He'll be admitted to the CCU on hypothermia protocol per PCCM  Continue amiodarone overnight as he appears to have converted into sinus rhythm  Hold ARB and Imdur for now. We'll try to start beta blocker.  Cardiology will  follow along while on cooling protocol.  ECHO 05/15/16 - Left ventricle: The cavity size was moderately dilated. Wall thickness was normal. Systolic function was severely reduced. The estimated ejection fraction was in the range of 25% to 30%. Akinesis and scarring of the anteroseptal and anterior myocardium; consistent with infarction in the distribution of the left anterior descending coronary artery. Dyskinesis of the apicalanterior myocardium. Features are consistent with a pseudonormal left ventricular filling pattern, with concomitant abnormal relaxation and increased filling pressure (grade 2 diastolic dysfunction). No evidence of thrombus. - Left atrium: The atrium was mildly dilated. - Atrial septum: No defect or patent foramen ovale was identified.  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 Re-intubated today, minimal neurologic recovery.   Assessment & Plan    Cardiac arrest: found by family pulseless. Felt 2/2 to VT/VF arrest, shocked by AED on the field. With recent STEMI, Dilated CM and presentation with cardiac arrest, he was taken back for cath. This showed possible edge dissection in circ, treated with an overlapping DES. He was placed on cooling protocol in the cath lab. Now extubated, with some neurologic recovery. Open eyes to voice and follows it.   CAD: Status post PCI of circumflex. Continue aspirin, statin and brilinta. Restarted low dose carvedilol. Maintaining SR.   Anoxic brain injury: Neurology signed off 1/30, last recommendation as below. Neurology now feels his prognosis is better. Slowly improving. Hasn't passed swallowing test yet.   Recommendations: 1) continue Keppra 1 g twice a day 2) if myoclonus continues, could consider clonazepam 0.25 mg twice a day titrated up slowly 3)  alternative would be to start Depakote 300 mg 3 times a day  Friend at  Bedside noted twitching on R sided. Noted during my conversation as well. Add meds as above recommendation. MD to decide.  Chronic systolic HF/ischemic cardiomyopathy:  Levophed discontinued. Now on carvedilol 3.125 mg po BID and lisinopril 5mg  qd. BP elevated at times. Net I & O negative 2.3L. Off diuretics currently. Volume status ok.   NSVT: No recent NSVT. Continue BB. K normal   Signed, [place], PA  06/06/2016, 9:09 AM    The patient was seen, examined and discussed with Bhagat,Bhavinkumar PA-C and I agree with the above.   The patient continues to recover and was transferred from CCU to telemetry yesterday.  He is very fatigued and sleeping most of the time. He sat in chair yesterday, PT&OT haven't seen him yet. The family states that he is making progress, able to recognize people.  We will call case management, he will require intense inpatient rehabilitation. The family is very concerned about premature discharge as he has been readmitted 3x in the last months and ultimately resulting in cardiac arrest.  Also he has had no urine output today, Crea 0.97, bladder US showed 1 L of urine, we will order a Foley catheter. No signs of fluid overload. He developed diarrhea, we will order C diff testing. He failed swallow study, we will place a nasal canula for now.  Tobias Alexander, MD 06/06/2016

## 2016-06-06 NOTE — Progress Notes (Signed)
Speech Language Pathology Treatment:    Patient Details Name: Douglas Carr MRN: [id number] DOB: 1966/04/26 Today's Date: 06/06/2016 Time: [id number] SLP Time Calculation (min) (ACUTE ONLY): 18 min  Assessment / Plan / Recommendation Clinical Impression  Patient seen for swallowing treatment, with wife/family at bedside. Patient's voice remains aphonic. After performing oral care, presented with ice chip via teaspoon. Patient with immediate cough response, suggestive of reduced airway protection, aspiration. Following PO trial, pt presented with delayed, productive cough requiring oral suction. Provided education regarding strict oral care, aspiration precautions and maintaining NPO status with cortrak in place. SLP will continue to follow, assess readiness for PO intake/ instrumental exam.   HPI HPI: Douglas Carr is a 51 yo male with PMH of CAD(recently admitted from 1/3-1/7 for MI, had PCI of Om2), HFrEF (EF 35%), DMII (on insulin), HLD and carotid disease. He was discharged home from that admission, but returned shortly there after with 8lb weight gain, worsening dyspnea and decreased UOP despite full compliance with his outpatient diuretic. His BNP was 610 and weight was increased from 239-246lbs.        SLP Plan  Continue with current plan of care     Recommendations  Diet recommendations: NPO Medication Administration: Via alternative means                Oral Care Recommendations: Oral care QID Follow up Recommendations: Other (comment) (TBD) Plan: Continue with current plan of care   Douglas BatonMary Beth Kyonna Frier, MS CF-SLP Speech-Language Pathologist [phone]   GO                Douglas Carr 06/06/2016, 11:05 AM

## 2016-06-06 NOTE — Progress Notes (Signed)
Pt pulled at NG tube. Was at 97cm and when assessed now was at 92cm. Paged cardiology fellow to see if xray was needed to confirm placement. New orders to get an xray to confirm. Pt was ordered flexiseal during the day and it came out. Also asked if it was okay to reinsert. MD stated that it was okay.

## 2016-06-06 NOTE — Progress Notes (Addendum)
Nutrition Consult / Follow-up  DOCUMENTATION CODES:   Obesity unspecified  INTERVENTION:   Change to Osmolite 1.2 @ 70 ml/hr (1680 ml/day) 30 ml Prostat daily Free water flushes 125 ml every 4 hours Provides: 2116 kcal, 108 grams protein, and 2112 ml free water   As diet is advanced will supplement as needed.   NUTRITION DIAGNOSIS:   Inadequate oral intake related to inability to eat as evidenced by NPO status. Ongoing.   GOAL:   Patient will meet greater than or equal to 90% of their needs Not met.   MONITOR:   TF tolerance, I & O's  ASSESSMENT:   51 yo male with PMH of CAD(recently admitted from 1/3-1/76fr MI, had PCI of Om2), HFrEF (EF 35%), DMII (on insulin), HLD and carotid disease. Pt admitted with OOH arrest, now with onset seizures.   Failed repeat swallow evaluation 2/1. Remains NPO. Cortrak tube in place, tip in the proximal jejunum. Currently receiving Jevity 1.2 at 40 ml/h, not yet at goal rate. Received MD Consult for TF management. RN reports patient with a lot of diarrhea since TF started yesterday. Laxatives have been held. Will try Osmolite 1.2 formula (lower osmolality with no fiber). Labs and medications reviewed. Sodium 150.  Diet Order:    NPO  Skin:  Reviewed, no issues  Last BM:  2/1  Height:   Ht Readings from Last 1 Encounters:  05/24/16 5' 9"  (1.753 m)    Weight:   Wt Readings from Last 1 Encounters:  06/06/16 205 lb (93 kg)    Ideal Body Weight:  72.7 kg  BMI:  Body mass index is 30.27 kg/m.  Estimated Nutritional Needs:   Kcal:  2000-2200  Protein:  105-115 grams  Fluid:  2 L/day  EDUCATION NEEDS:   No education needs identified at this time  KMolli Barrows RGreeley Hill LNorthlake CBig FallsPager [phone] Hours Pager [phone]

## 2016-06-06 NOTE — Care Management (Addendum)
1641 06-06-16 Tomi BambergerBrenda Graves-Bigelow, RN,BSN [phone] CM received LTAC Referral in regards to disposition needs. CM did speak with Physician Advisor. Pt is not meeting LTAC at this time. CM will monitor next week. Plan will be for SNF once stable at this time.

## 2016-06-06 NOTE — Addendum Note (Signed)
Addended by: Frederica KusterMILLER, Selita Staiger M on: 06/06/2016 12:10 PM   Modules accepted: Level of Service

## 2016-06-07 LAB — GLUCOSE, CAPILLARY
GLUCOSE-CAPILLARY: 162 mg/dL — AB (ref 65–99)
GLUCOSE-CAPILLARY: 220 mg/dL — AB (ref 65–99)
GLUCOSE-CAPILLARY: 284 mg/dL — AB (ref 65–99)
Glucose-Capillary: 216 mg/dL — ABNORMAL HIGH (ref 65–99)
Glucose-Capillary: 235 mg/dL — ABNORMAL HIGH (ref 65–99)
Glucose-Capillary: 284 mg/dL — ABNORMAL HIGH (ref 65–99)

## 2016-06-07 MED ORDER — FREE WATER
200.0000 mL | Freq: Four times a day (QID) | Status: DC
  Administered 2016-06-07 – 2016-06-16 (×17): 200 mL

## 2016-06-07 NOTE — Progress Notes (Signed)
Speech Language Pathology Treatment:    Patient Details Name: Douglas Carr MRN: [id number] DOB: [birth year] Today's Date: 06/07/2016 Time: [id number] SLP Time Calculation (min) (ACUTE ONLY): 17 min  Assessment / Plan / Recommendation Clinical Impression  Patient seen for dysphagia treatment, family friends present at bedside. Oral care performed; dried secretions removed from palate. Patient's vocal quality has improved; voicing is clear and strong. Administered ice chips x5, thin water via teaspoon x3, pureed solids x2 with no overt signs of aspiration. Provided education to patient regarding instrumental assessment; recommend MBS as scheduling allows (2/4 or 2/5). Continue NPO with cortrak pending instrumental assessment.    HPI HPI: Douglas Carr is a 51 yo male with PMH of CAD(recently admitted from 1/3-1/7 for MI, had PCI of Om2), HFrEF (EF 35%), DMII (on insulin), HLD and carotid disease. He was discharged home from that admission, but returned shortly there after with 8lb weight gain, worsening dyspnea and decreased UOP despite full compliance with his outpatient diuretic. His BNP was 610 and weight was increased from 239-246lbs.        SLP Plan  MBS;Continue with current plan of care     Recommendations  Diet recommendations: NPO Medication Administration: Via alternative means                Oral Care Recommendations: Oral care QID Follow up Recommendations: Other (comment) Plan: MBS;Continue with current plan of care       GO               Douglas BatonMary Beth Wendelin Bradt, MS CF-SLP Speech-Language Pathologist [phone]  Douglas Carr 06/07/2016, 5:56 PM

## 2016-06-07 NOTE — Progress Notes (Signed)
Progress Note  Patient Name: Douglas Carr Date of Encounter: 06/07/2016  Primary Cardiologist: Allyson SabalBerry  Subjective   He seems to be making slow but definite neurological progress following his cardiac arrest. He clearly comprehends the conversation and is capable of following commands and gutters a few words and short sentences that do make sense. Unfortunately he failed his swallow study. He has fairly severe ataxia but is able to lift both arms against gravity, with purposeful but poorly coordinated movements. No arrhythmia detected on telemetry. Denies angina or dyspnea at rest. Complains of feeling very hot but has normal temperature.  Inpatient Medications    Scheduled Meds: . aspirin  81 mg Per Tube Daily  . atorvastatin  80 mg Per Tube q1800  . carvedilol  3.125 mg Oral BID WC  .  ceFAZolin (ANCEF) IV  2 g Intravenous Q8H  . chlorhexidine  15 mL Mouth Rinse BID  . docusate  100 mg Per Tube BID  . famotidine  20 mg Oral Daily  . feeding supplement (PRO-STAT SUGAR FREE 64)  30 mL Per Tube Daily  . free water  125 mL Per Tube Q4H  . heparin  5,000 Units Subcutaneous Q8H  . insulin aspart  0-15 Units Subcutaneous Q4H  . levETIRAcetam  1,000 mg Intravenous Q12H  . lisinopril  5 mg Oral Daily  . mouth rinse  15 mL Mouth Rinse q12n4p  . multivitamin with minerals  1 tablet Oral Daily   Continuous Infusions: . feeding supplement (OSMOLITE 1.2 CAL) 1,000 mL (06/07/16 1100)   PRN Meds: sodium chloride, acetaminophen, acetaminophen, ondansetron (ZOFRAN) IV, pneumococcal 23 valent vaccine   Vital Signs    Vitals:   06/07/16 0300 06/07/16 0400 06/07/16 0700 06/07/16 1100  BP:  (!) 166/80 101/76   Pulse:  80 81 85  Resp:  18 18 (!) 27  Temp:  97.8 F (36.6 C) 98.6 F (37 C) 97.5 F (36.4 C)  TempSrc:  Oral Axillary Oral  SpO2:  98% 98%   Weight: 94.3 kg (208 lb)     Height:        Intake/Output Summary (Last 24 hours) at 06/07/16 1226 Last data filed at  06/07/16 1100  Gross per 24 hour  Intake             1712 ml  Output             2650 ml  Net             -938 ml   Filed Weights   06/05/16 0500 06/06/16 0401 06/07/16 0300  Weight: 103.3 kg (227 lb 11.8 oz) 93 kg (205 lb) 94.3 kg (208 lb)    Telemetry    NSR - Personally Reviewed  ECG    05/25/2016, NSR, 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 GEN: No acute distress.   Neck: No JVD Cardiac: RRR, no murmurs, rubs, or gallops.  Respiratory: Clear to auscultation bilaterally. GI: Soft, nontender, non-distended  MS: No edema; No deformity. Neuro:   able to lift all 4 extremities against gravity but appears very weak and ataxic.  Psych: Normal affect   Labs    Chemistry Recent Labs Lab 06/04/16 0458 06/05/16 0216 06/06/16 0544  NA 141 149* 150*  K 3.6 3.4* 3.7  CL 106 113* 114*  CO2 24  27 24  GLUCOSE 135* 136* 209*  BUN 11 23* 21*  CREATININE 0.74 1.00 0.97  CALCIUM 8.3* 8.2* 8.3*  GFRNONAA >60 >60 >60  GFRAA >60 >60 >60  ANIONGAP 11 9 12      Hematology Recent Labs Lab 06/03/16 0158 06/05/16 0216 06/06/16 0544  WBC 13.9* 9.3 10.5  RBC 3.89* 4.09* 4.32  HGB 10.7* 11.3* 11.9*  HCT 35.2* 36.3* 38.0*  MCV 90.5 88.8 88.0  MCH 27.5 27.6 27.5  MCHC 30.4 31.1 31.3  RDW 13.4 13.3 12.9  PLT 194 178 226      Radiology    Dg Abd Portable 1v  Result Date: 06/06/2016 CLINICAL DATA:  Nasogastric tube placement.  Initial encounter. EXAM: PORTABLE ABDOMEN - 1 VIEW COMPARISON:  Abdominal radiograph performed 06/05/2016 FINDINGS: The patient's enteric tube is seen ending about the proximal jejunum. The visualized bowel gas pattern is grossly unremarkable. No free intra-abdominal air is seen, though evaluation for free air is limited on a single supine view. No acute osseous abnormalities are  identified. IMPRESSION: Enteric tube noted ending about the proximal jejunum. Electronically Signed   By: Roanna Raider M.D.   On: 06/06/2016 23:57   Dg Abd Portable 1v  Result Date: 06/05/2016 CLINICAL DATA:  Feeding tube placement EXAM: PORTABLE ABDOMEN - 1 VIEW COMPARISON:  06/05/2016 head 04:48 FINDINGS: The enteric tube extends through the stomach and duodenum, with tip in the region of the proximal jejunum. Visible portions of the abdominal gas pattern are unremarkable. IMPRESSION: Enteric tube extends to the proximal jejunum. Electronically Signed   By: Ellery Plunk M.D.   On: 06/05/2016 19:21    Cardiac Studies   Cardiac Cath: 05/07/16   LV end diastolic pressure is severely elevated.  Ost LAD to Prox LAD lesion, 30 %stenosed.  Ost 2nd Diag to 2nd Diag lesion, 50 %stenosed.  Ost 1st Diag to 1st Diag lesion, 70 %stenosed.  Ost Cx to Prox Cx lesion, 50 %stenosed.  Ost 1st Mrg to 1st Mrg lesion, 35 %stenosed.  2nd Mrg lesion, 90 %stenosed.  A STENT PROMUS PREM MR 2.5X24 drug eluting stent was successfully placed.  Post intervention, there is a 0% residual stenosis.  Prox RCA to Mid RCA lesion, 50 %stenosed.  RPDA lesion, 100 %stenosed.  1. Diffuse coronary artery disease with relatively small diabetic vessels.  2. Severe segmental stenosis in the second OM. Lesion is hazy and ulcerative. This is the culprit lesion 3. Severely elevated LVEDP 4. Successful stenting of the second OM with DES.  Plan: DAPT for one year. Aggressive risk factor modification. Will treat with IV lasix for elevated LVEDP. Add ARB, beta blocker as tolerated. Assess LV function with Echo. Trend serial troponins and ECG.      Cath 05/24/16  Ost Cx to Mid Cx lesion, 70 %stenosed leading into OM2 as the main trunk of the Circumflex.  Ost 2nd Mrg to 2nd Mrg recent Promus DES 2.5 x 24 stent, - focal 90 %stenosed with what appears to be proximal edge dissection with thrombus  A STENT  PROMUS PREM MR 2.5X20 drug eluting stent was successfully placed from Ostium of Circumflex into OM2, and overlaps previously placed stent.  Post intervention, there is a 0% residual stenosis.  ____________________________________________________  Suezanne Jacquet LAD to Prox LAD lesion, 30 %stenosed.  Ost 1st Diag to 1st Diag lesion, 70 %stenosed.  Ost 1st Mrg to 1st Mrg lesion, 35 %stenosed. Ost 2nd Diag to 2nd Diag lesion, 50 %stenosed.  Prox RCA to Mid RCA lesion,  65 %stenosed - lesion appears similar to prior cath, with mild progression.  Very distal RPDA lesion, 100 %stenosed.  LV end diastolic pressure is severely elevated.  There is no aortic valve stenosis.   Status post what is most likely VF or VT arrest 2 weeks post STEMI with PCI to the circumflex.  His EKG itself did not show signs of ST elevation, however given his recent STEMI, known cardiac myopathy and presentation with cardiac arrest, I felt it prudent taken to the Cath Lab.  Angiographically there appeared to be a possible proximal edge dissection with thrombus in this lesion, however there is TIMI 2 flow. This lesion was treated with an overlapping proximal stent up to the ostium of the circumflex and OM 2. Angiographically there was good result. It is quite possible at least 1 and strut is into the left main, but non flow-limiting.  Plan:  He'll be admitted to the CCU on hypothermia protocol per PCCM  Continue amiodarone overnight as he appears to have converted into sinus rhythm  Hold ARB and Imdur for now. We'll try to start beta blocker.  Cardiology will follow along while on cooling protocol.  ECHO 05/15/16 - Left ventricle: The cavity size was moderately dilated. Wall thickness was normal. Systolic function was severely reduced. The estimated ejection fraction was in the range of 25% to 30%. Akinesis and scarring of the anteroseptal and anterior myocardium; consistent with infarction in the  distribution of the left anterior descending coronary artery. Dyskinesis of the apicalanterior myocardium. Features are consistent with a pseudonormal left ventricular filling pattern, with concomitant abnormal relaxation and increased filling pressure (grade 2 diastolic dysfunction). No evidence of thrombus. - Left atrium: The atrium was mildly dilated. - Atrial septum: No defect or patent foramen ovale was identified.    Patient Profile     51 y.o. male very early onset coronary artery disease and severe ischemic cardiomyopathy, presenting with quickly resuscitated VT/VF cardiac arrest and requiring repeat PCI to left circumflex roughly 2 weeks after initial presentation with STEMI.  CPR was started within 1 minute of loss of consciousness. He received hypothermia protocol. He appears to have fairly severe neurological sequelae, but is improving on a daily basis.  Assessment & Plan    1. Cardiac arrest: Seems to be making progress neurologically , but he has a long way to go. Although he had a minor increase in cardiac enzymes, it does not appear that he had another true acute myocardial infarction. She did receive revascularization, but I believe that he has a secondary prevention indication for a defibrillator. Therefore I don't think we need to wait for 90 days following his revascularization to implant a defibrillator. On the other hand, due to his neurological problems he is not yet ready to undergo defibrillator placement. He will need a LifeVest until a permanent ICD can be implanted. No NSVT seen on monitor last 24 hours. 2. Anoxic encephalopathy/ injury: Hard to say to what degree his deficits are due to ischemic stunning versus permanent. CPR was started very quickly following his cardiac arrest and he underwent hypothermia protocol. Together with his youth, hopefully this portends a better than average prognosis. He will require intensive rehabilitation with physical  therapy/occupational therapy/speech therapy. I don't think he will be able to work again and he should be on disability. 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 May have to call them back for further recommendations since the family thinks the twitching is worse. 3. CAD: As in patient  with myocardial infarction at age 33, managed medically due to collateral formation; severe ischemic cardiomyopathy due to diffuse coronary disease, STEMI of left circumflex coronary artery in January, with recurrent revascularization following the cardiac arrest. Would keep on lifelong dual antiplatelet therapy 4. CHF, systolic: Volume assessment is a little challenging but his blood pressure is high he does not appear to be 2. Will probably need to restart diuretics. We'll try to titrate up his ACE inhibitor. At this point cerebral perfusion pressure should be less of an issue and we need to focus on lowering cardiac workload. 5. Hypernatremia: We'll increase free water via tube feeding. Unfortunately he cannot self regulate the intake of free water until he passes the swallow study. 6. Urinary retention: He had 1 L of bladder content yesterday and a Foley catheter was placed. May need "bladder training".   Signed, Thurmon Fair, MD  06/07/2016, 12:26 PM

## 2016-06-07 NOTE — Care Management Note (Signed)
Case Management Note  Patient Details  Name: Margarita SermonsClifford M Latour MRN: [id number] Date of Birth: 1965-09-18  Subjective/Objective: Received call from Damien FusiGerald Simmons , Representative with [employer] to request information from EMR.. Copied results from Cardiac Cath, Echo, H&P and most recent CARDS note for Insurance approval of LifeVest. Rep may be reached at [phone]                  Action/Plan:    Yvone NeuGenese M Adia Crammer, [facility] CenterRNCM [phone]   Expected Discharge Date:                  Expected Discharge Plan:     In-House Referral:  Hospice / Palliative Care  Discharge planning Services  CM Consult, Medication Assistance  Post Acute Care Choice:  NA Choice offered to:  NA  DME Arranged:  N/A DME Agency:  NA  HH Arranged:  NA HH Agency:  NA  Status of Service:  In process, will continue to follow  If discussed at Long Length of Stay Meetings, dates discussed:    Additional Comments:  Yvone NeuCrutchfield, Lillard Bailon M, RN 06/07/2016, 2:20 PM

## 2016-06-08 ENCOUNTER — Inpatient Hospital Stay (HOSPITAL_COMMUNITY): Payer: BC Managed Care – PPO

## 2016-06-08 DIAGNOSIS — G253 Myoclonus: Secondary | ICD-10-CM

## 2016-06-08 LAB — GLUCOSE, CAPILLARY
GLUCOSE-CAPILLARY: 202 mg/dL — AB (ref 65–99)
GLUCOSE-CAPILLARY: 187 mg/dL — AB (ref 65–99)
Glucose-Capillary: 224 mg/dL — ABNORMAL HIGH (ref 65–99)
Glucose-Capillary: 211 mg/dL — ABNORMAL HIGH (ref 65–99)
Glucose-Capillary: 201 mg/dL — ABNORMAL HIGH (ref 65–99)
Glucose-Capillary: 188 mg/dL — ABNORMAL HIGH (ref 65–99)

## 2016-06-08 LAB — BASIC METABOLIC PANEL
Anion gap: 12 (ref 5–15)
BUN: 12 mg/dL (ref 6–20)
CHLORIDE: 109 mmol/L (ref 101–111)
CO2: 25 mmol/L (ref 22–32)
Calcium: 7.8 mg/dL — ABNORMAL LOW (ref 8.9–10.3)
Creatinine, Ser: 0.8 mg/dL (ref 0.61–1.24)
GFR calc Af Amer: >60 mL/min (ref >60)
GFR calc non Af Amer: >60 mL/min (ref >60)
GLUCOSE: 216 mg/dL — AB (ref 65–99)
POTASSIUM: 3.5 mmol/L (ref 3.5–5.1)
Sodium: 146 mmol/L — ABNORMAL HIGH (ref 135–145)

## 2016-06-08 LAB — TRIGLYCERIDES: TRIGLYCERIDES: 184 mg/dL — AB (ref <150)

## 2016-06-08 MED ORDER — CARVEDILOL 6.25 MG PO TABS
6.2500 mg | ORAL_TABLET | Freq: Two times a day (BID) | ORAL | Status: DC
  Administered 2016-06-10 – 2016-06-17 (×13): 6.25 mg via ORAL
  Filled 2016-06-08 (×13): qty 1

## 2016-06-08 MED ORDER — TORSEMIDE 20 MG PO TABS
20.0000 mg | ORAL_TABLET | Freq: Every day | ORAL | Status: DC
  Administered 2016-06-10 – 2016-06-17 (×7): 20 mg via ORAL
  Filled 2016-06-08 (×8): qty 1

## 2016-06-08 NOTE — Progress Notes (Signed)
Rehab Admissions Coordinator Note:  Patient was screened by Trish MageLogue, Levi Crass M for appropriateness for an Inpatient Acute Rehab Consult.  At this time, we are recommending Skilled Nursing Facility.  However, if patient improves and begins to participate and tolerate therapies more fully, then may want to consider inpatient rehab consult.  Trish MageLogue, Jodette Wik M 06/08/2016, 10:40 PM  I can be reached at [phone].

## 2016-06-08 NOTE — Progress Notes (Signed)
Modified Barium Swallow Progress Note  Patient Details  Name: Douglas Carr MRN: [id number] Date of Birth: 02/26/1966  Today's Date: 06/08/2016  Modified Barium Swallow completed.  Full report located under Chart Review in the Imaging Section.  Brief recommendations include the following:  Clinical Impression  Patient presents with severe pharyngeal dysphagia with significant silent aspiration of thin liquids (~1/2 tsp), moderate aspiration of honey-thick liquids with cough response, and moderate silent aspiration of pureed. Oral stage of swallowing appeared within functional limits. Swallow initation delayed to the level of the pyriform sinuses with thin liquids. Hyolaryngeal elevation significantly reduced, leading to incomplete epiglottic deflection, reduced amplitude and duration of UES opening, obstruction of bolus flow which spills into the laryngeal vestibule and is silently aspirated during the swallow. Moderate amount of residue remains in the valleculae/ post-cricoid segment with all consistencies trialed (thin, honey, puree), leading to penetration and ultimately aspiration after the swallow. Chin tuck was trialed x1 with thin liquids but is ineffective in preventing aspiration. Based on the above deficits, diet initiation is not advised at this time. Patient may benefit from exercises to address physiologic impairments as well as training in compensatory techniques to improve swallow function. Recommend patient remain NPO; will likely need temporary source of alternative nutrition. SLP will continue to follow.    Swallow Evaluation Recommendations       SLP Diet Recommendations: NPO;Alternative means - temporary       Medication Administration: Via alternative means               Oral Care Recommendations: Oral care QID     [name], [place] CF-SLP Speech-Language Pathologist [phone]   Arlana LindauMary E Shaterica Carr 06/08/2016,2:35 PM

## 2016-06-08 NOTE — Progress Notes (Signed)
Pt pulled Cor trak out. Tube feeding stopped. Dr. Mayford Knifeurner notified.

## 2016-06-08 NOTE — Progress Notes (Signed)
Progress Note  Patient Name: Douglas Carr Date of Encounter: 06/08/2016  Primary Cardiologist: Allyson Sabal  Subjective   Alert, able to report his own name and those of his family and friends. Unable to name his location or the month. Reports pain in his chest and he is tender to palpation over the sternum, consistent with post-CPR injury. He pulled his feeding tube out last night. His stool is becoming more solid. Hypernatremia has improved  Inpatient Medications    Scheduled Meds: . aspirin  81 mg Per Tube Daily  . atorvastatin  80 mg Per Tube q1800  . carvedilol  3.125 mg Oral BID WC  .  ceFAZolin (ANCEF) IV  2 g Intravenous Q8H  . chlorhexidine  15 mL Mouth Rinse BID  . docusate  100 mg Per Tube BID  . famotidine  20 mg Oral Daily  . feeding supplement (PRO-STAT SUGAR FREE 64)  30 mL Per Tube Daily  . free water  200 mL Per Tube QID  . heparin  5,000 Units Subcutaneous Q8H  . insulin aspart  0-15 Units Subcutaneous Q4H  . levETIRAcetam  1,000 mg Intravenous Q12H  . lisinopril  5 mg Oral Daily  . mouth rinse  15 mL Mouth Rinse q12n4p  . multivitamin with minerals  1 tablet Oral Daily   Continuous Infusions: . feeding supplement (OSMOLITE 1.2 CAL) 1,000 mL (06/07/16 1500)   PRN Meds: sodium chloride, acetaminophen, acetaminophen, ondansetron (ZOFRAN) IV, pneumococcal 23 valent vaccine   Vital Signs    Vitals:   06/08/16 0323 06/08/16 0356 06/08/16 0523 06/08/16 0741  BP: (!) 150/99  (!) 129/52 138/80  Pulse: 75  86 79  Resp: 19  20 19   Temp: 97 F (36.1 C)   97.8 F (36.6 C)  TempSrc: Axillary   Oral  SpO2: 97%  95% 96%  Weight:  94.3 kg (208 lb)    Height:        Intake/Output Summary (Last 24 hours) at 06/08/16 0944 Last data filed at 06/08/16 4098  Gross per 24 hour  Intake             1465 ml  Output              825 ml  Net              640 ml   Filed Weights   06/06/16 0401 06/07/16 0300 06/08/16 0356  Weight: 93 kg (205 lb) 94.3 kg (208 lb)  94.3 kg (208 lb)    Telemetry    NSR - Personally Reviewed  ECG    05/25/2016, NSR, extensive old anterior MI, lateral ST-T changes - Personally Reviewed  Physical Exam  Alert, only able to speak in a hoarse voice, very short usually monosyllabic utterances, but these appear to be appropriate and he follows commands GEN: No acute distress.   Neck: No JVD Cardiac: RRR, no murmurs, rubs, or gallops.  Respiratory: Clear to auscultation bilaterally. GI: Soft, nontender, non-distended  MS: No edema; No deformity. Neuro:   muscle weakness and ataxia Psych: Normal affect   Labs    Chemistry Recent Labs Lab 06/05/16 0216 06/06/16 0544 06/08/16 0846  NA 149* 150* 146*  K 3.4* 3.7 3.5  CL 113* 114* 109  CO2 27 24 25   GLUCOSE 136* 209* 216*  BUN 23* 21* 12  CREATININE 1.00 0.97 0.80  CALCIUM 8.2* 8.3* 7.8*  GFRNONAA >60 >60 >60  GFRAA >60 >60 >60  ANIONGAP 9 12 12  Hematology Recent Labs Lab 06/03/16 0158 06/05/16 0216 06/06/16 0544  WBC 13.9* 9.3 10.5  RBC 3.89* 4.09* 4.32  HGB 10.7* 11.3* 11.9*  HCT 35.2* 36.3* 38.0*  MCV 90.5 88.8 88.0  MCH 27.5 27.6 27.5  MCHC 30.4 31.1 31.3  RDW 13.4 13.3 12.9  PLT 194 178 226     Radiology    Dg Abd Portable 1v  Result Date: 06/06/2016 CLINICAL DATA:  Nasogastric tube placement.  Initial encounter. EXAM: PORTABLE ABDOMEN - 1 VIEW COMPARISON:  Abdominal radiograph performed 06/05/2016 FINDINGS: The patient's enteric tube is seen ending about the proximal jejunum. The visualized bowel gas pattern is grossly unremarkable. No free intra-abdominal air is seen, though evaluation for free air is limited on a single supine view. No acute osseous abnormalities are identified. IMPRESSION: Enteric tube noted ending about the proximal jejunum. Electronically Signed   By: Roanna Raider M.D.   On: 06/06/2016 23:57    Cardiac Studies   Cardiac Cath: 05/07/16   LV end diastolic pressure is severely elevated.  Ost LAD to Prox LAD  lesion, 30 %stenosed.  Ost 2nd Diag to 2nd Diag lesion, 50 %stenosed.  Ost 1st Diag to 1st Diag lesion, 70 %stenosed.  Ost Cx to Prox Cx lesion, 50 %stenosed.  Ost 1st Mrg to 1st Mrg lesion, 35 %stenosed.  2nd Mrg lesion, 90 %stenosed.  A STENT PROMUS PREM MR 2.5X24 drug eluting stent was successfully placed.  Post intervention, there is a 0% residual stenosis.  Prox RCA to Mid RCA lesion, 50 %stenosed.  RPDA lesion, 100 %stenosed.  1. Diffuse coronary artery disease with relatively small diabetic vessels.  2. Severe segmental stenosis in the second OM. Lesion is hazy and ulcerative. This is the culprit lesion 3. Severely elevated LVEDP 4. Successful stenting of the second OM with DES.  Plan: DAPT for one year. Aggressive risk factor modification. Will treat with IV lasix for elevated LVEDP. Add ARB, beta blocker as tolerated. Assess LV function with Echo. Trend serial troponins and ECG.      Cath 05/24/16  Ost Cx to Mid Cx lesion, 70 %stenosed leading into OM2 as the main trunk of the Circumflex.  Ost 2nd Mrg to 2nd Mrg recent Promus DES 2.5 x 24 stent, - focal 90 %stenosed with what appears to be proximal edge dissection with thrombus  A STENT PROMUS PREM MR 2.5X20 drug eluting stent was successfully placed from Ostium of Circumflex into OM2, and overlaps previously placed stent.  Post intervention, there is a 0% residual stenosis.  ____________________________________________________  Suezanne Jacquet LAD to Prox LAD lesion, 30 %stenosed.  Ost 1st Diag to 1st Diag lesion, 70 %stenosed.  Ost 1st Mrg to 1st Mrg lesion, 35 %stenosed. Ost 2nd Diag to 2nd Diag lesion, 50 %stenosed.  Prox RCA to Mid RCA lesion, 65 %stenosed - lesion appears similar to prior cath, with mild progression.  Very distal RPDA lesion, 100 %stenosed.  LV end diastolic pressure is severely elevated.  There is no aortic valve stenosis.   Status post what is most likely VF or VT arrest 2 weeks post  STEMI with PCI to the circumflex.  His EKG itself did not show signs of ST elevation, however given his recent STEMI, known cardiac myopathy and presentation with cardiac arrest, I felt it prudent taken to the Cath Lab.  Angiographically there appeared to be a possible proximal edge dissection with thrombus in this lesion, however there is TIMI 2 flow. This lesion was treated with an overlapping proximal stent  up to the ostium of the circumflex and OM 2. Angiographically there was good result. It is quite possible at least 1 and strut is into the left main, but non flow-limiting.  Plan:  He'll be admitted to the CCU on hypothermia protocol per PCCM  Continue amiodarone overnight as he appears to have converted into sinus rhythm  Hold ARB and Imdur for now. We'll try to start beta blocker.  Cardiology will follow along while on cooling protocol.  ECHO 05/15/16 - Left ventricle: The cavity size was moderately dilated. Wall thickness was normal. Systolic function was severely reduced. The estimated ejection fraction was in the range of 25% to 30%. Akinesis and scarring of the anteroseptal and anterior myocardium; consistent with infarction in the distribution of the left anterior descending coronary artery. Dyskinesis of the apicalanterior myocardium. Features are consistent with a pseudonormal left ventricular filling pattern, with concomitant abnormal relaxation and increased filling pressure (grade 2 diastolic dysfunction). No evidence of thrombus. - Left atrium: The atrium was mildly dilated. - Atrial septum: No defect or patent foramen ovale was identified.    Patient Profile     51 y.o. male very early onset coronary artery disease and severe ischemic cardiomyopathy, presenting with quickly resuscitated VT/VF cardiac arrest and requiring repeat PCI to left circumflex roughly 2 weeks after initial presentation with STEMI.  CPR was started within 1 minute of  loss of consciousness. He received hypothermia protocol. He appears to have fairly severe neurological sequelae, but is improving on a daily basis.  Assessment & Plan    1. Cardiac arrest: Seems to be making progress neurologically , but he has a long way to go. Although he had a minor increase in cardiac enzymes, it does not appear that he had another true acute myocardial infarction. He did receive revascularization, but I believe that he has a secondary prevention indication for a defibrillator. Therefore I don't think we need to wait for 90 days following his revascularization to implant a defibrillator. On the other hand, due to his neurological problems he is not yet ready to undergo defibrillator placement. I have started the procedure to place a LifeVest until a permanent ICD can be implanted. No NSVT seen on monitor this weekend. 2. Anoxic encephalopathy/ injury: Hard to say to what degree his deficits are due to ischemic stunning versus permanent. CPR was started very quickly following his cardiac arrest and he underwent hypothermia protocol. Together with his youth, hopefully this portends a better than average prognosis. He will require intensive rehabilitation with physical therapy/occupational therapy/speech therapy. I don't think he will be able to work again and he should be on disability. Since he pulled his feeding tube out today, we'll try to do another swallow study and see whether we can progress to normal swallowing. 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 On my exam yesterday and today actually see very little twitching 3. CAD: Early onset with myocardial infarction at age 51 (1986), managed medically due to collateral formation; severe ischemic cardiomyopathy due to diffuse coronary disease, then STEMI of left circumflex coronary artery in January 2018, with recurrent revascularization following the cardiac arrest. Would keep on lifelong dual antiplatelet  therapy 4. CHF, systolic: Volume assessment is a little challenging. He is lying completely flat in bed without any respiratory difficulty. Sodium is improving with additional free water. Will probably need to restart diuretics tomorrow. We'll try to titrate up his beta blocker today. At this point cerebral perfusion pressure should be less of an  issue and we need to focus on lowering cardiac workload. 5. Hypernatremia: Increased free water via tube feeding, Na improving. Unfortunately he cannot self regulate the intake of free water until he passes the swallow study. 6. Urinary retention: He had 1 L of bladder content yesterday and a Foley catheter was placed. May need "bladder training".  Signed, Thurmon Fair, MD  06/08/2016, 9:44 AM

## 2016-06-09 ENCOUNTER — Inpatient Hospital Stay (HOSPITAL_COMMUNITY): Payer: BC Managed Care – PPO

## 2016-06-09 ENCOUNTER — Encounter (HOSPITAL_COMMUNITY): Payer: Self-pay | Admitting: Radiology

## 2016-06-09 ENCOUNTER — Telehealth: Payer: Self-pay | Admitting: Cardiovascular Disease

## 2016-06-09 DIAGNOSIS — J9601 Acute respiratory failure with hypoxia: Secondary | ICD-10-CM

## 2016-06-09 DIAGNOSIS — I639 Cerebral infarction, unspecified: Secondary | ICD-10-CM

## 2016-06-09 DIAGNOSIS — E118 Type 2 diabetes mellitus with unspecified complications: Secondary | ICD-10-CM

## 2016-06-09 DIAGNOSIS — I469 Cardiac arrest, cause unspecified: Secondary | ICD-10-CM

## 2016-06-09 DIAGNOSIS — I6521 Occlusion and stenosis of right carotid artery: Secondary | ICD-10-CM

## 2016-06-09 LAB — GLUCOSE, CAPILLARY
GLUCOSE-CAPILLARY: 156 mg/dL — AB (ref 65–99)
GLUCOSE-CAPILLARY: 171 mg/dL — AB (ref 65–99)
GLUCOSE-CAPILLARY: 191 mg/dL — AB (ref 65–99)
GLUCOSE-CAPILLARY: 194 mg/dL — AB (ref 65–99)
Glucose-Capillary: 166 mg/dL — ABNORMAL HIGH (ref 65–99)
Glucose-Capillary: 169 mg/dL — ABNORMAL HIGH (ref 65–99)
Glucose-Capillary: 171 mg/dL — ABNORMAL HIGH (ref 65–99)

## 2016-06-09 LAB — VAS US CAROTID
LCCAPDIAS: 28 cm/s
LEFT ECA DIAS: -38 cm/s
LICAPDIAS: -56 cm/s
Left CCA dist dias: -40 cm/s
Left CCA dist sys: -128 cm/s
Left CCA prox sys: 101 cm/s
Left ICA dist dias: -22 cm/s
Left ICA dist sys: -125 cm/s
Left ICA prox sys: -144 cm/s
RCCAPDIAS: 2 cm/s
RIGHT VERTEBRAL DIAS: -27 cm/s
Right CCA prox sys: 30 cm/s

## 2016-06-09 MED ORDER — DEXTROSE-NACL 5-0.45 % IV SOLN
INTRAVENOUS | Status: DC
  Administered 2016-06-09: 1000 mL via INTRAVENOUS
  Administered 2016-06-10: 22:00:00 via INTRAVENOUS

## 2016-06-09 MED ORDER — IOPAMIDOL (ISOVUE-370) INJECTION 76%
INTRAVENOUS | Status: AC
  Administered 2016-06-09: 13:00:00
  Filled 2016-06-09: qty 50

## 2016-06-09 NOTE — Progress Notes (Signed)
    Discussed patient's care plan with his wife, [name], who is his power of attorney. She is aware of the findings as of 06/09/2016. She has reiterated that patient's friend, Channing MuttersRoy, can be made aware of care plan, but she remains the decision maker. [name] has also asked that patient's mother be given very limited information due to her own memory problems.  Shelly Flattenavid Caley Ciaramitaro, MD [facility] 06/09/2016, 5:32 PM

## 2016-06-09 NOTE — Telephone Encounter (Signed)
Coralee NorthNina [facility] from [facility] is calling to find out if anyone has arranged for a life vest to be sent home with patient. She also stated that Dr. Royann Shiversroitoru stated that he would arrange it. Please call with follow-up, Thanks.

## 2016-06-09 NOTE — Evaluation (Signed)
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 He is s/p PCI to circumflex. Pt also with an anoxic brain injury. Of note, pt recently admitted on 05/07/16-05/11/16 for an MI. PMH including but not limited to CHF, HTN, CAD, DM and PVD.   Clinical Impression   This 51 yo male admitted with above presents to acute OT with deficits below (see OT problem list) thus affecting his PLOF to being totally independent with basic ADLs, IADLs,and working full time. He will benefit from acute OT with follow up OT on CIR to get to a S level to return home with family.    Follow Up Recommendations  CIR;Supervision/Assistance - 24 hour    Equipment Recommendations  Other (comment) (TBD at next venue)    Recommendations for Other Services Rehab consult     Precautions / Restrictions Precautions Precautions: Fall Restrictions Weight Bearing Restrictions: No      Mobility Bed Mobility   Bed Mobility: Supine to Sit     Supine to sit: Max assist;HOB elevated     General bed mobility comments: pt required increased time, veral and tactile cues for sequencing and mod A with bilateral LEs and max A at trunk to achieve sitting EOB with use of bed pads to position hips.  Transfers Overall transfer level: Needs assistance Equipment used: 2 person hand held assist Transfers: Sit to/from [employer]Stand;Stand Pivot Transfers Sit to Stand: Mod assist;+2 physical assistance Stand pivot transfers: Max assist;+2 physical assistance       General transfer comment: pt required increased time, verbal and tactile cues for bilateral hand placement, mod A +2 to power up into standing (decreased intiation) and max A +2 for pivotal movements to recliner.    Balance Overall balance assessment: Needs assistance Sitting-balance support:  Single extremity supported;Feet supported Sitting balance-Leahy Scale: Poor Sitting balance - Comments: pt reliant on one UE support   Standing balance support: Bilateral upper extremity supported;During functional activity Standing balance-Leahy Scale: Poor Standing balance comment: pt required mod A x2 to maintain upright standing with cues to stay standing tall                            ADL Overall ADL's : Needs assistance/impaired Eating/Feeding: NPO   Grooming: Wash/dry face;Sitting Grooming Details (indicate cue type and reason): placed washcloth in his right hand to have him wash his face; he attempted with his right hand, but was difficult so he moved it to his left hand and then was able to wash his face Upper Body Bathing: Moderate assistance;Sitting   Lower Body Bathing: Maximal assistance (Mod A+2 sit<>stand)   Upper Body Dressing : Maximal assistance;Sitting   Lower Body Dressing: Maximal assistance (Mod A+2 sit<>stand)   Toilet Transfer: Maximal assistance;+2 for physical assistance;Stand-pivot Toilet Transfer Details (indicate cue type and reason): L knee buckling  Toileting- Clothing Manipulation and Hygiene: Total assistance (Mod A+2 sit<>stand)               Vision Additional Comments: Continue to assess during sessions--nothing apparent as this time          Pertinent Vitals/Pain Pain Assessment: Faces Faces Pain Scale: No hurt     Hand Dominance Right   Extremity/Trunk Assessment Upper Extremity Assessment Upper Extremity Assessment: Generalized weakness RUE Deficits /  Details: pt with decreased strength in R UE as compared to L. In supine he can lift RUE fully up over his head; however in stting he can barely give me a low "5". Pt with very weak grip and losses hold if not paying attention. hand appears minimally swollen as well   Lower Extremity Assessment Lower Extremity Assessment: Defer to PT evaluation       Communication  Communication Communication:  (speaks in whispers)   Cognition Arousal/Alertness: Awake/alert Behavior During Therapy: Flat affect Overall Cognitive Status: Impaired/Different from baseline Area of Impairment: Orientation;Attention;Memory;Following commands;Safety/judgement;Problem solving Orientation Level: Disoriented to;Time (did not know year (x2); did not know month x2 ( first time he needed cue that it was the second month; second time he did say Jan and was able to state Feb once I said it is the next month)) Current Attention Level: Sustained Memory:  (could not remember one of his dogs names) Following Commands: Follows one step commands with increased time Safety/Judgement: Decreased awareness of safety;Decreased awareness of deficits   Problem Solving: Slow processing;Decreased initiation;Difficulty sequencing;Requires verbal cues;Requires tactile cues        Exercises   Other Exercises Other Exercises: Educated pt's mom on Bil UE exercies she can do with pt        Home Living Family/patient expects to be discharged to:: Private residence Living Arrangements: Spouse/significant other;Children;Parent Available Help at Discharge: Family;Available 24 hours/day (mother says she can stay with him)                             Additional Comments: pt lives at home with wife. His wife is expecting that he will need some short term rehab prior to returning home.      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        OT Problem List: Decreased strength;Decreased range of motion;Impaired balance (sitting and/or standing);Decreased cognition;Decreased safety awareness;Decreased knowledge of use of DME or AE;Obesity;Impaired UE functional use   OT Treatment/Interventions: Self-care/ADL training;Therapeutic activities;Therapeutic exercise;Neuromuscular education;Cognitive  remediation/compensation;Patient/family education;DME and/or AE instruction;Balance training    OT Goals(Current goals can be found in the care plan section) Acute Rehab OT Goals Patient Stated Goal: wanting to get out of the bed OT Goal Formulation: With patient/family Time For Goal Achievement: 06/23/16 Potential to Achieve Goals: Good  OT Frequency: Min 3X/week           Co-evaluation PT/OT/SLP Co-Evaluation/Treatment: Yes Reason for Co-Treatment: Complexity of the patient's impairments (multi-system involvement);For patient/therapist safety   OT goals addressed during session: ADL's and self-care;Strengthening/ROM      End of Session Equipment Utilized During Treatment: Gait belt Nurse Communication: Mobility status;Need for lift equipment (NT made aware as well)  Activity Tolerance: Patient tolerated treatment well Patient left: in chair;with call bell/phone within reach;with family/visitor present   Time: 1020-1050 OT Time Calculation (min): 30 min Charges:  OT General Charges $OT Visit: 1 Procedure OT Evaluation $OT Eval Moderate Complexity: 1 Procedure  Evette Georges [phone] 06/09/2016, 11:58 AM

## 2016-06-09 NOTE — Telephone Encounter (Signed)
Returned call. Douglas Carr informs me she's got this arranged and set up w [facility].

## 2016-06-09 NOTE — Progress Notes (Signed)
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 He is s/p PCI to circumflex. Pt also with an anoxic brain injury. Of note, pt recently admitted on 05/07/16-05/11/16 for an MI. PMH including but not limited to CHF, HTN, CAD, DM and PVD.    PT Comments    Patient progressing well towards PT goals. Pt with impaired initiation, problem solving, and memory issues. Tolerated performing ADLs tasks sitting EOB and standing with assist of 2. Pt with left knee instability. Follows commands and motivated to get OOB. Pt with dizziness during transitions today with drop in BP. See below. Sitting BP 125/103 Sitting BP post standing 79/65 Sitting BP post transfer 93/77 Sitting BP after 2 mins 106/73 Discharge recommendation updated to CIR as pt eager to maximize independence.   Follow Up Recommendations  CIR     Equipment Recommendations  None recommended by PT    Recommendations for Other Services Rehab consult     Precautions / Restrictions Precautions Precautions: Fall Precaution Comments: watch BP Restrictions Weight Bearing Restrictions: No    Mobility  Bed Mobility Overal bed mobility: Needs Assistance Bed Mobility: Supine to Sit     Supine to sit: Max assist;HOB elevated     General bed mobility comments: pt required increased time, veral and tactile cues for sequencing and mod A with bilateral LEs and max A at trunk to achieve sitting EOB with use of bed pads to position hips. Dizziness present.  Transfers Overall transfer level: Needs assistance Equipment used: 2 person hand held assist Transfers: Sit to/from [employer] Sit to Stand: Mod assist;+2 physical assistance Stand pivot transfers: Max assist;+2 physical assistance       General transfer comment: pt required increased  time, verbal and tactile cues for bilateral hand placement, mod A +2 to power up into standing (decreased intiation) and max A +2 for pivotal movements to recliner. Left knee buckling during transfer. Dizziness present.  Ambulation/Gait                 Stairs            Wheelchair Mobility    Modified Rankin (Stroke Patients Only)       Balance Overall balance assessment: Needs assistance Sitting-balance support: Feet supported;Single extremity supported Sitting balance-Leahy Scale: Poor Sitting balance - Comments: pt reliant on one UE support, able to sit unsupported for a few seconds.   Standing balance support: Bilateral upper extremity supported;During functional activity Standing balance-Leahy Scale: Poor Standing balance comment: pt required mod A x2 to maintain upright standing with cues to stay standing tall; left knee instability                    Cognition Arousal/Alertness: Awake/alert Behavior During Therapy: Flat affect Overall Cognitive Status: Impaired/Different from baseline Area of Impairment: Orientation;Attention;Following commands;Problem solving;Safety/judgement Orientation Level: Disoriented to;Time Current Attention Level: Sustained Memory:  (could not remember one of his dogs names) Following Commands: Follows one step commands with increased time Safety/Judgement: Decreased awareness of safety;Decreased awareness of deficits   Problem Solving: Slow processing;Decreased initiation;Difficulty sequencing;Requires verbal cues;Requires tactile cues      Exercises Other Exercises Other Exercises: Educated pt's mom on Bil UE exercies she can do with pt    General Comments General comments (skin integrity, edema, etc.): hypotensive during transitions. See assessment for  details.      Pertinent Vitals/Pain Pain Assessment: Faces Faces Pain Scale: No hurt    Home Living Family/patient expects to be discharged to:: Private  residence Living Arrangements: Spouse/significant other;Children;Parent Available Help at Discharge: Family;Available 24 hours/day (mother says she can stay with him)           Additional Comments: pt lives at home with wife. His wife is expecting that he will need some short term rehab prior to returning home.    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   PT Goals (current goals can now be found in the care plan section) Acute Rehab PT Goals Patient Stated Goal: wanting to get out of the bed Progress towards PT goals: Progressing toward goals    Frequency    Min 3X/week      PT Plan Discharge plan needs to be updated    Co-evaluation PT/OT/SLP Co-Evaluation/Treatment: Yes Reason for Co-Treatment: Necessary to address cognition/behavior during functional activity;For patient/therapist safety;To address functional/ADL transfers PT goals addressed during session: Mobility/safety with mobility;Balance OT goals addressed during session: ADL's and self-care;Strengthening/ROM     End of Session Equipment Utilized During Treatment: Gait belt Activity Tolerance: Patient tolerated treatment well;Treatment limited secondary to medical complications (Comment) (hypotensive ) Patient left: in chair;with call bell/phone within reach;with chair alarm set;with family/visitor present     Time: 1018-1050 PT Time Calculation (min) (ACUTE ONLY): 32 min  Charges:  $Therapeutic Activity: 8-22 mins                    G Codes:      Douglas Carr 06/09/2016, 12:22 PM Mylo RedShauna Giannamarie Carr, PT, DPT [phone]

## 2016-06-09 NOTE — Progress Notes (Signed)
Progress Note  Patient Name: Douglas Carr Date of Encounter: 06/09/2016  Primary Cardiologist: Allyson Sabal  Subjective   Alert, able to report his own name and those of his family and friends. Is aware of his situation and Reports pain in his chest and he is tender to palpation over the sternum, consistent with post-CPR injury. He pulled his feeding tube over the weekend and failed swallow eval. His stool is becoming more solid. Hypernatremia has improved  Inpatient Medications    Scheduled Meds: . aspirin  81 mg Per Tube Daily  . atorvastatin  80 mg Per Tube q1800  . carvedilol  6.25 mg Oral BID WC  .  ceFAZolin (ANCEF) IV  2 g Intravenous Q8H  . chlorhexidine  15 mL Mouth Rinse BID  . docusate  100 mg Per Tube BID  . famotidine  20 mg Oral Daily  . feeding supplement (PRO-STAT SUGAR FREE 64)  30 mL Per Tube Daily  . free water  200 mL Per Tube QID  . heparin  5,000 Units Subcutaneous Q8H  . insulin aspart  0-15 Units Subcutaneous Q4H  . levETIRAcetam  1,000 mg Intravenous Q12H  . lisinopril  5 mg Oral Daily  . mouth rinse  15 mL Mouth Rinse q12n4p  . multivitamin with minerals  1 tablet Oral Daily  . torsemide  20 mg Oral Daily   Continuous Infusions: . feeding supplement (OSMOLITE 1.2 CAL) 1,000 mL (06/07/16 1500)   PRN Meds: sodium chloride, acetaminophen, acetaminophen, ondansetron (ZOFRAN) IV, pneumococcal 23 valent vaccine   Vital Signs    Vitals:   06/09/16 0435 06/09/16 0438 06/09/16 0440 06/09/16 0446  BP: (!) 141/81 (!) 141/81  122/72  Pulse: 80 74  76  Resp:  18  19  Temp: 98.6 F (37 C)     TempSrc: Oral     SpO2:  94%  94%  Weight:   206 lb (93.4 kg)   Height:        Intake/Output Summary (Last 24 hours) at 06/09/16 0831 Last data filed at 06/09/16 0201  Gross per 24 hour  Intake              520 ml  Output              275 ml  Net              245 ml   Filed Weights   06/07/16 0300 06/08/16 0356 06/09/16 0440  Weight: 208 lb (94.3 kg)  208 lb (94.3 kg) 206 lb (93.4 kg)    Telemetry    NSR with rates in the 70's and 80's- Personally Reviewed  ECG    05/25/2016, NSR, extensive old anterior MI, lateral ST-T changes - Personally Reviewed  Physical Exam  Alert, only able to speak in a hoarse voice, very short usually monosyllabic utterances, but these appear to be appropriate and he follows commands GEN: No acute distress.   Neck: No JVD Cardiac: RRR, no murmurs, rubs, or gallops.  Respiratory: Clear to auscultation bilaterally. GI: Soft, nontender, non-distended  MS: No edema; No deformity. Neuro:   muscle weakness and ataxia Psych: Normal affect   Labs    Chemistry  Recent Labs Lab 06/05/16 0216 06/06/16 0544 06/08/16 0846  NA 149* 150* 146*  K 3.4* 3.7 3.5  CL 113* 114* 109  CO2 27 24 25   GLUCOSE 136* 209* 216*  BUN 23* 21* 12  CREATININE 1.00 0.97 0.80  CALCIUM 8.2* 8.3* 7.8*  GFRNONAA >60 >60 >60  GFRAA >60 >60 >60  ANIONGAP 9 12 12      Hematology  Recent Labs Lab 06/03/16 0158 06/05/16 0216 06/06/16 0544  WBC 13.9* 9.3 10.5  RBC 3.89* 4.09* 4.32  HGB 10.7* 11.3* 11.9*  HCT 35.2* 36.3* 38.0*  MCV 90.5 88.8 88.0  MCH 27.5 27.6 27.5  MCHC 30.4 31.1 31.3  RDW 13.4 13.3 12.9  PLT 194 178 226     Radiology    Dg Swallowing Func-speech Pathology  Result Date: 06/08/2016 Objective Swallowing Evaluation: Type of Study: MBS-Modified Barium Swallow Study Patient Details Name: Douglas Carr MRN: [id number] Date of Birth: Jun 06, 1965 Today's Date: 06/08/2016 Time: SLP Start Time (ACUTE ONLY): 1214-SLP Stop Time (ACUTE ONLY): 1231 SLP Time Calculation (min) (ACUTE ONLY): 17 min Past Medical History: Past Medical History: Diagnosis Date . Acute ST elevation myocardial infarction (STEMI) involving left circumflex coronary artery (HCC) 05/07/2016  PCI to Cx-OM . Anginal pain (HCC)   secondary to sm. vessel disease . Anxiety  . Arthritis   BIL KNEE PAIN AND BIL ANKLE PAIN . Bone spur of ankle  .  Cardiac arrest (HCC) 05/24/2016  with v fib . 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 Coronary artery disease involving native coronary artery of native heart with angina pectoris (HCC) 05/24/2016  Remote MI at 51 years of age, Last cath 2010-diffuse non-obstructive disease; last echo 06/16/08 -normal LV function, moderate concentric hypertrophy; nuc 08/2008 no ischemia;  medical therapy; STEMI May 07 2016 - PCI to Cx-OM . Diabetes mellitus   ON ORAL MEDICATION AND INSULIN . Dilated cardiomyopathy (HCC) 05/24/2016  EF 325-30% by Echo post STEMI (previously 30-35%)  . Hyperlipidemia  . Hypertension  . Myocardial infarction 1996  Post MI . Peripheral vascular disease (HCC)   HAS LEFT CAROTID ARTERY STENOSIS   AND IS S/P RIGHT CAROTID ENDARTERECTOMY 2010 Last carotid dopplers 01/08/2012 wth patent endarterectomy site . Status post coronary artery stent placement  Past Surgical History: Past Surgical History: Procedure Laterality Date . APPENDECTOMY   . CARDIAC CATHETERIZATION    FEB 2010, significant branch vessel disease wth diag, marginal, PDA & PLA, nml. LV function . CARDIAC CATHETERIZATION N/A 05/07/2016  Procedure: Left Heart Cath and Coronary Angiography;  Surgeon: Peter M Swaziland, MD;  Location: [facility] INVASIVE CV LAB;  Service: Cardiovascular;  Laterality: N/A; . CARDIAC CATHETERIZATION N/A 05/07/2016  Procedure: Coronary Stent Intervention;  Surgeon: Peter M Swaziland, MD;  Location: [facility] INVASIVE CV LAB;  Service: Cardiovascular;  Laterality: N/A; . CARDIAC CATHETERIZATION N/A 05/24/2016  Procedure: Left Heart Cath and Coronary Angiography;  Surgeon: Marykay Lex, MD;  Location: [facility] INVASIVE CV LAB;  Service: Cardiovascular;  Laterality: N/A; . CARDIAC CATHETERIZATION N/A 05/24/2016  Procedure: Coronary Stent Intervention;  Surgeon: Marykay Lex, MD;  Location: [facility] INVASIVE CV LAB;  Service: Cardiovascular;  Laterality: N/A;  2.5x20 Promus to Ostial/proximal circumflex .  CAROTID ENDARTERECTOMY  09/2008  Rt CEA . LUNG BIOPSY  2013  Bx's suggest granulomatous dz. . RT ANKLE   2013 HPI: Mr. Kerwin is a 51 yo male with PMH of CAD(recently admitted from 1/3-1/7 for MI, had PCI of Om2), HFrEF (EF 35%), DMII (on insulin), HLD and carotid disease. He was discharged home from that admission, but returned shortly there after with 8lb weight gain, worsening dyspnea and decreased UOP despite full compliance with his outpatient diuretic. His BNP was 610 and weight was increased from 239-246lbs. Admitted with  severe ischemic cardiomyopathy, presenting with quickly resuscitated VT/VF cardiac arrest and requiring repeat PCI to left circumflex roughly 2 weeks after initial presentation with STEMI. Anoxic brain injury: MRI 06/02/16 showed Stable left thalamus 4 mm acute infarction. Stable few scattered chronic cortical infarcts over the convexities bilaterally. Subjective: Alert, eager to eat Assessment / Plan / Recommendation CHL IP CLINICAL IMPRESSIONS 06/08/2016 Therapy Diagnosis Severe pharyngeal phase dysphagia Clinical Impression Patient presents with severe pharyngeal dysphagia with significant silent aspiration of thin liquids (~1/2 tsp), moderate aspiration of honey-thick liquids with cough response, and moderate silent aspiration of pureed. Oral stage of swallowing appeared within functional limits. Swallow initation delayed to the level of the pyriform sinuses with thin liquids. Hyolaryngeal elevation significantly reduced, leading to incomplete epiglottic deflection, reduced amplitude and duration of UES opening, obstruction of bolus flow which spills into the laryngeal vestibule and is silently aspirated during the swallow. Moderate amount of residue remains in the valleculae/ post-cricoid segment with all consistencies trialed (thin, honey, puree), leading to penetration and ultimately aspiration after the swallow. Chin tuck was trialed x1 with thin liquids but is ineffective in  preventing aspiration. Based on the above deficits, diet initiation is not advised at this time. Patient may benefit from exercises to address physiologic impairments as well as training in compensatory techniques to improve swallow function. Recommend patient remain NPO; will likely need temporary source of alternative nutrition. SLP will continue to follow.  Impact on safety and function Severe aspiration risk   CHL IP TREATMENT RECOMMENDATION 06/08/2016 Treatment Recommendations Therapy as outlined in treatment plan below   Prognosis 06/08/2016 Prognosis for Safe Diet Advancement Fair Barriers to Reach Goals Cognitive deficits;Severity of deficits Barriers/Prognosis Comment -- CHL IP DIET RECOMMENDATION 06/08/2016 SLP Diet Recommendations NPO;Alternative means - temporary Liquid Administration via -- Medication Administration Via alternative means Compensations -- Postural Changes --   CHL IP OTHER RECOMMENDATIONS 06/08/2016 Recommended Consults -- Oral Care Recommendations Oral care QID Other Recommendations --   CHL IP FOLLOW UP RECOMMENDATIONS 06/08/2016 Follow up Recommendations Inpatient Rehab   CHL IP FREQUENCY AND DURATION 06/08/2016 Speech Therapy Frequency (ACUTE ONLY) min 2x/week Treatment Duration 2 weeks      CHL IP ORAL PHASE 06/08/2016 Oral Phase WFL Oral - Pudding Teaspoon -- Oral - Pudding Cup -- Oral - Honey Teaspoon -- Oral - Honey Cup -- Oral - Nectar Teaspoon -- Oral - Nectar Cup -- Oral - Nectar Straw -- Oral - Thin Teaspoon -- Oral - Thin Cup -- Oral - Thin Straw -- Oral - Puree -- Oral - Mech Soft -- Oral - Regular -- Oral - Multi-Consistency -- Oral - Pill -- Oral Phase - Comment --  CHL IP PHARYNGEAL PHASE 06/08/2016 Pharyngeal Phase Impaired Pharyngeal- Pudding Teaspoon -- Pharyngeal -- Pharyngeal- Pudding Cup -- Pharyngeal -- Pharyngeal- Honey Teaspoon Reduced airway/laryngeal closure;Reduced laryngeal elevation;Reduced epiglottic inversion;Penetration/Apiration after swallow;Pharyngeal residue -  valleculae;Pharyngeal residue - cp segment;Moderate aspiration Pharyngeal Material enters airway, passes BELOW cords and not ejected out despite cough attempt by patient Pharyngeal- Honey Cup -- Pharyngeal -- Pharyngeal- Nectar Teaspoon -- Pharyngeal -- Pharyngeal- Nectar Cup -- Pharyngeal -- Pharyngeal- Nectar Straw -- Pharyngeal -- Pharyngeal- Thin Teaspoon Delayed swallow initiation-pyriform sinuses;Reduced laryngeal elevation;Reduced airway/laryngeal closure;Penetration/Aspiration during swallow;Penetration/Apiration after swallow;Pharyngeal residue - cp segment;Pharyngeal residue - valleculae;Significant aspiration (Amount) Pharyngeal Material enters airway, passes BELOW cords without attempt by patient to eject out (silent aspiration) Pharyngeal- Thin Cup -- Pharyngeal -- Pharyngeal- Thin Straw -- Pharyngeal -- Pharyngeal- Puree Reduced epiglottic inversion;Reduced laryngeal elevation;Moderate aspiration;Penetration/Apiration after  swallow;Pharyngeal residue - valleculae;Pharyngeal residue - cp segment;Reduced airway/laryngeal closure Pharyngeal Material enters airway, passes BELOW cords without attempt by patient to eject out (silent aspiration) Pharyngeal- Mechanical Soft -- Pharyngeal -- Pharyngeal- Regular -- Pharyngeal -- Pharyngeal- Multi-consistency -- Pharyngeal -- Pharyngeal- Pill -- Pharyngeal -- Pharyngeal Comment --  CHL IP CERVICAL ESOPHAGEAL PHASE 06/08/2016 Cervical Esophageal Phase Impaired Pudding Teaspoon -- Pudding Cup -- Honey Teaspoon Reduced cricopharyngeal relaxation Honey Cup -- Nectar Teaspoon -- Nectar Cup -- Nectar Straw -- Thin Teaspoon Reduced cricopharyngeal relaxation Thin Cup -- Thin Straw -- Puree Reduced cricopharyngeal relaxation Mechanical Soft -- Regular -- Multi-consistency -- Pill -- Cervical Esophageal Comment appears 2/2 reduced laryngeal elevation CHL IP GO 06/04/2016 Functional Assessment Tool Used NOMS Functional Limitations Swallowing Swallow Current Status (W0981) CM  Swallow Goal Status (X9147) CL Swallow Discharge Status (W2956) (None) Motor Speech Current Status (O1308) (None) Motor Speech Goal Status (M5784) (None) Motor Speech Goal Status (O9629) (None) Spoken Language Comprehension Current Status (B2841) (None) Spoken Language Comprehension Goal Status (L2440) (None) Spoken Language Comprehension Discharge Status (N0272) (None) Spoken Language Expression Current Status (Z3664) (None) Spoken Language Expression Goal Status (Q0347) (None) Spoken Language Expression Discharge Status (Q2595) (None) Attention Current Status (G3875) (None) Attention Goal Status (I4332) (None) Attention Discharge Status (R5188) (None) Memory Current Status (C1660) (None) Memory Goal Status (Y3016) (None) Memory Discharge Status (W1093) (None) Voice Current Status (A3557) (None) Voice Goal Status (D2202) (None) Voice Discharge Status (R4270) (None) Other Speech-Language Pathology Functional Limitation (W2376) (None) Other Speech-Language Pathology Functional Limitation Goal Status (E8315) (None) Other Speech-Language Pathology Functional Limitation Discharge Status [phone] (None) Arlana Lindau 06/08/2016, 2:37 PM Rondel Baton, MS CF-SLP Speech-Language Pathologist [phone]              Cardiac Studies   Cardiac Cath: 05/07/16   LV end diastolic pressure is severely elevated.  Ost LAD to Prox LAD lesion, 30 %stenosed.  Ost 2nd Diag to 2nd Diag lesion, 50 %stenosed.  Ost 1st Diag to 1st Diag lesion, 70 %stenosed.  Ost Cx to Prox Cx lesion, 50 %stenosed.  Ost 1st Mrg to 1st Mrg lesion, 35 %stenosed.  2nd Mrg lesion, 90 %stenosed.  A STENT PROMUS PREM MR 2.5X24 drug eluting stent was successfully placed.  Post intervention, there is a 0% residual stenosis.  Prox RCA to Mid RCA lesion, 50 %stenosed.  RPDA lesion, 100 %stenosed.  1. Diffuse coronary artery disease with relatively small diabetic vessels.  2. Severe segmental stenosis in the second OM. Lesion is hazy and  ulcerative. This is the culprit lesion 3. Severely elevated LVEDP 4. Successful stenting of the second OM with DES.  Plan: DAPT for one year. Aggressive risk factor modification. Will treat with IV lasix for elevated LVEDP. Add ARB, beta blocker as tolerated. Assess LV function with Echo. Trend serial troponins and ECG.      Cath 05/24/16  Ost Cx to Mid Cx lesion, 70 %stenosed leading into OM2 as the main trunk of the Circumflex.  Ost 2nd Mrg to 2nd Mrg recent Promus DES 2.5 x 24 stent, - focal 90 %stenosed with what appears to be proximal edge dissection with thrombus  A STENT PROMUS PREM MR 2.5X20 drug eluting stent was successfully placed from Ostium of Circumflex into OM2, and overlaps previously placed stent.  Post intervention, there is a 0% residual stenosis.  ____________________________________________________  Suezanne Jacquet LAD to Prox LAD lesion, 30 %stenosed.  Ost 1st Diag to 1st Diag lesion, 70 %stenosed.  Ost 1st Mrg to 1st Mrg  lesion, 35 %stenosed. Ost 2nd Diag to 2nd Diag lesion, 50 %stenosed.  Prox RCA to Mid RCA lesion, 65 %stenosed - lesion appears similar to prior cath, with mild progression.  Very distal RPDA lesion, 100 %stenosed.  LV end diastolic pressure is severely elevated.  There is no aortic valve stenosis.   Status post what is most likely VF or VT arrest 2 weeks post STEMI with PCI to the circumflex.  His EKG itself did not show signs of ST elevation, however given his recent STEMI, known cardiac myopathy and presentation with cardiac arrest, I felt it prudent taken to the Cath Lab.  Angiographically there appeared to be a possible proximal edge dissection with thrombus in this lesion, however there is TIMI 2 flow. This lesion was treated with an overlapping proximal stent up to the ostium of the circumflex and OM 2. Angiographically there was good result. It is quite possible at least 1 and strut is into the left main, but non  flow-limiting.  Plan:  He'll be admitted to the CCU on hypothermia protocol per PCCM  Continue amiodarone overnight as he appears to have converted into sinus rhythm  Hold ARB and Imdur for now. We'll try to start beta blocker.  Cardiology will follow along while on cooling protocol.  ECHO 05/15/16 - Left ventricle: The cavity size was moderately dilated. Wall thickness was normal. Systolic function was severely reduced. The estimated ejection fraction was in the range of 25% to 30%. Akinesis and scarring of the anteroseptal and anterior myocardium; consistent with infarction in the distribution of the left anterior descending coronary artery. Dyskinesis of the apicalanterior myocardium. Features are consistent with a pseudonormal left ventricular filling pattern, with concomitant abnormal relaxation and increased filling pressure (grade 2 diastolic dysfunction). No evidence of thrombus. - Left atrium: The atrium was mildly dilated. - Atrial septum: No defect or patent foramen ovale was identified.    Patient Profile     51 y.o. male very early onset coronary artery disease and severe ischemic cardiomyopathy, presenting with quickly resuscitated VT/VF cardiac arrest and requiring repeat PCI to left circumflex roughly 2 weeks after initial presentation with STEMI.  CPR was started within 1 minute of loss of consciousness. He received hypothermia protocol. He appears to have fairly severe neurological sequelae, but is improving on a daily basis.  Assessment & Plan    1. Cardiac arrest: Seems to be making progress neurologically , but he has a long way to go. Although he had a minor increase in cardiac enzymes, it does not appear that he had another true acute myocardial infarction. He did receive revascularization, but I believe that he has a secondary prevention indication for a defibrillator. Therefore Dr Royann Shiversroitoru did not think we need to wait for 90 days  following his revascularization to implant a defibrillator. On the other hand, due to his neurological problems he is not yet ready to undergo defibrillator placement.  Procedure to place a LifeVest until a permanent ICD can be implanted has been initiated. No NSVT seen on monitor this weekend.  2. Anoxic encephalopathy/ injury: Hard to say to what degree his deficits are due to ischemic stunning versus permanent. CPR was started very quickly following his cardiac arrest and he underwent hypothermia protocol. Together with his youth, hopefully this portends a better than average prognosis. He will require intensive rehabilitation with physical therapy/occupational therapy/speech therapy. I don't think he will be able to work again and he should be on disability. Since he pulled his  feeding tube out today, we'll try to do another swallow study and see whether we can progress to normal swallowing. 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 On my exam yesterday and today actually see very little twitching He seems to be makin good progress from a neuro standpoit .  3. CAD: Early onset with myocardial infarction at age 80 (1986), managed medically due to collateral formation; severe ischemic cardiomyopathy due to diffuse coronary disease, then STEMI of left circumflex coronary artery in January 2018, with recurrent revascularization following the cardiac arrest. Would keep on lifelong dual antiplatelet therapy  4. CHF, systolic: Volume assessment is a little challenging. He is lying completely flat in bed without any respiratory difficulty. Sodium is improving with additional free water. Will probably need to restart diuretics tomorrow. Carvedilol titrated but has not received due to being NPO and no access. At this point cerebral perfusion pressure should be less of an issue and we need to focus on lowering cardiac workload.  5. Hypernatremia: Increased free water via tube feeding, Na  improving. Unfortunately he cannot self regulate the intake of free water until he passes the swallow study.  6. Urinary retention: He had 1 L of bladder content yesterday and a Foley catheter was placed. May need "bladder training".      Signed, Berton Bon, NP  06/09/2016, 8:31 AM    Attending Note:   The patient was seen and examined.  Agree with assessment and plan as noted above.  Changes made to the above note as needed.  Patient seen and independently examined with Lizabeth Leyden , NP .   We discussed all aspects of the encounter. I agree with the assessment and plan as stated above.  1.  Coronary artery disease: Patient status post PCI. As a long history of premature coronary artery disease. He's not having any angina present.  2. Status post cardiac arrest. He seems to be making progress. Neurologically he has improved over the past several weeks. I agree that he will probably need a LifeVest. He will need a repeat swallow evaluation either today or tomorrow. He'll need a feeding tube in the meantime.     I have spent a total of 40 minutes with patient reviewing hospital  notes , telemetry, EKGs, labs and examining patient as well as establishing an assessment and plan that was discussed with the patient. > 50% of time was spent in direct patient care.   Vesta Mixer, Montez Hageman., MD, [facility] 06/09/2016, 9:06 AM [address],  [address] Office [phone] Pager [phone]

## 2016-06-09 NOTE — Progress Notes (Signed)
VASCULAR LAB PRELIMINARY  PRELIMINARY  PRELIMINARY  PRELIMINARY  Carotid duplex completed.    Preliminary report:  There is acute thrombus beginning in the right proximal CCA throughout the visualized ICA.  There appears to be significant plaque vs. Thrombus noted at the proximal ICA and ECA.   Called critical results to Dr. Tomie ChinaMerrell  Douglas Carr, [facility] HospitalCANDACE, RVT 06/09/2016, 1:57 PM

## 2016-06-09 NOTE — Progress Notes (Signed)
Speech Language Pathology Treatment: Dysphagia  Patient Details Name: Douglas Carr MRN: [id number] DOB: 1966/03/06 Today's Date: 06/09/2016 Time: 1430-1450 SLP Time Calculation (min) (ACUTE ONLY): 20 min  Assessment / Plan / Recommendation Clinical Impression  Treatment focused on family education. Wife and son present. Educated on MBS results, plan, and prognosis as well as recommendations for ice chips after oral care. Wife able to verbalize understanding of oral care technique following demonstration as well as verbalize all aspiration precautions/compensatory strategies for ice chip intake. Discussed possibility of patients mother providing oral care/ice chips and at this time, decision make to limit this to wife and staff. Will continue to f/u.    HPI HPI: Douglas Carr is a 51 yo male with PMH of CAD(recently admitted from 1/3-1/7 for MI, had PCI of Om2), HFrEF (EF 35%), DMII (on insulin), HLD and carotid disease. He was discharged home from that admission, but returned shortly there after with 8lb weight gain, worsening dyspnea and decreased UOP despite full compliance with his outpatient diuretic. His BNP was 610 and weight was increased from 239-246lbs. Admitted with severe ischemic cardiomyopathy, presenting with quickly resuscitated VT/VF cardiac arrest and requiring repeat PCI to left circumflex roughly 2 weeks after initial presentation with STEMI. Anoxic brain injury: MRI 06/02/16 showed Stable left thalamus 4 mm acute infarction. Stable few scattered chronic cortical infarcts over the convexities bilaterally.      SLP Plan  Continue with current plan of care     Recommendations  Diet recommendations: NPO (except ice chips after oral care) Medication Administration: Via alternative means                General recommendations: Rehab consult Oral Care Recommendations: Oral care QID;Oral care prior to ice chip/H20 Follow up Recommendations: Inpatient Rehab Plan: Continue  with current plan of care       GO             [facility] Steve Youngberg MA, CCC-SLP [phone]    Douglas Carr 06/09/2016, 4:08 PM

## 2016-06-09 NOTE — Consult Note (Signed)
Medical Consultation   Douglas Carr  ZOX:[id number]  DOB: [birth year]  DOA: 05/24/2016  PCP: [name], MD   Outpatient Specialists:  Cardiology, Roma Schanz   Requesting physician: Dr Elease Hashimoto - Cardiology  Reason for consultation: Persistent neurological deficits and nutrition  History of Present Illness: Douglas Carr is an 51 y.o. male with a past medical history significant for CAD/ST EMI on 05/07/2016, cardiac arrest secondary to V. fib on 05/24/2016, chronic combined systolic and diastolic congestive heart failure, DM, HLD, HTN, PVD. Patient with an extensive cardiac history including his first MI at age 72. Patient was admitted to [facility] on 05/24/2016 after V. fib leading to cardiac arrest. At time of presentation to the ED patient was in A. fib with RVR and was taken emergently to the cath lab for evaluation of recent stent placement at previous admission. Patient was down for an unknown period of time prior to being brought to the emergency room. Since admission patient has had a very difficult and slow recovery. At time of consultation patient's only complaint is generalized achy chest pain. No radiation of pain. Constant. Worse with deep respirations or certain movements putting stress on the chest. Denies any palpitations, shortness of breath, diaphoresis, nausea, abdominal pain, back pain, neck stiffness, headache.   Review of Systems:  ROS As per HPI otherwise 10 point review of systems negative.    Past Medical History: Past Medical History:  Diagnosis Date  . Acute ST elevation myocardial infarction (STEMI) involving left circumflex coronary artery (HCC) 05/07/2016   PCI to Cx-OM  . Anginal pain (HCC)    secondary to sm. vessel disease  . Anxiety   . Arthritis    BIL KNEE PAIN AND BIL ANKLE PAIN  . Bone spur of ankle   . Cardiac arrest (HCC) 05/24/2016   with v fib  . Chronic combined systolic and diastolic CHF,  NYHA class 2 and ACA/AHA stage C (HCC) 05/13/2016  . Coronary artery disease involving native coronary artery of native heart with angina pectoris (HCC) 05/24/2016   Remote MI at 51 years of age, Last cath 2010-diffuse non-obstructive disease; last echo 06/16/08 -normal LV function, moderate concentric hypertrophy; nuc 08/2008 no ischemia;  medical therapy; STEMI May 07 2016 - PCI to Cx-OM  . Diabetes mellitus    ON ORAL MEDICATION AND INSULIN  . Dilated cardiomyopathy (HCC) 05/24/2016   EF 325-30% by Echo post STEMI (previously 30-35%)   . Hyperlipidemia   . Hypertension   . Myocardial infarction 1996   Post MI  . Peripheral vascular disease (HCC)    HAS LEFT CAROTID ARTERY STENOSIS   AND IS S/P RIGHT CAROTID ENDARTERECTOMY 2010 Last carotid dopplers 01/08/2012 wth patent endarterectomy site  . Status post coronary artery stent placement     Past Surgical History: Past Surgical History:  Procedure Laterality Date  . APPENDECTOMY    . CARDIAC CATHETERIZATION     FEB 2010, significant branch vessel disease wth diag, marginal, PDA & PLA, nml. LV function  . CARDIAC CATHETERIZATION N/A 05/07/2016   Procedure: Left Heart Cath and Coronary Angiography;  Surgeon: Peter M Swaziland, MD;  Location: [facility] INVASIVE CV LAB;  Service: Cardiovascular;  Laterality: N/A;  . CARDIAC CATHETERIZATION N/A 05/07/2016   Procedure: Coronary Stent Intervention;  Surgeon: Peter M Swaziland, MD;  Location: [facility] INVASIVE CV LAB;  Service: Cardiovascular;  Laterality: N/A;  . CARDIAC CATHETERIZATION  N/A 05/24/2016   Procedure: Left Heart Cath and Coronary Angiography;  Surgeon: Marykay Lex, MD;  Location: [facility] INVASIVE CV LAB;  Service: Cardiovascular;  Laterality: N/A;  . CARDIAC CATHETERIZATION N/A 05/24/2016   Procedure: Coronary Stent Intervention;  Surgeon: Marykay Lex, MD;  Location: [facility] INVASIVE CV LAB;  Service: Cardiovascular;  Laterality: N/A;  2.5x20 Promus to Ostial/proximal circumflex  . CAROTID ENDARTERECTOMY  09/2008     Rt CEA  . LUNG BIOPSY  2013   Bx's suggest granulomatous dz.  . RT ANKLE   2013     Allergies:  No Known Allergies   Social History:  reports that he quit smoking about 4 years ago. His smoking use included Cigarettes. He has a 44.00 pack-year smoking history. He has never used smokeless tobacco. He reports that he drinks alcohol. He reports that he does not use drugs.   Family History: Family History  Problem Relation Age of Onset  . Hypertension Mother   . Diabetes Mother   . Hypertension Maternal Grandfather   . Heart attack Paternal Grandfather   . Heart attack Father 34     Physical Exam: Vitals:   06/09/16 0435 06/09/16 0438 06/09/16 0440 06/09/16 0446  BP: (!) 141/81 (!) 141/81  122/72  Pulse: 80 74  76  Resp:  18  19  Temp: 98.6 F (37 C)     TempSrc: Oral     SpO2:  94%  94%  Weight:   93.4 kg (206 lb)   Height:        General:  Appears calm and comfortable Eyes:  PERRL, EOMI, normal lids, iris ENT: Poor dentition, dry mm Neck:  no LAD, masses or thyromegaly Cardiovascular:  RRR, II/VI systolic murmur. No LE edema.  Respiratory:  CTA bilaterally, no w/r/r. Normal respiratory effort. Abdomen:  soft, ntnd, NABS Skin:  no rash or induration seen on limited exam Musculoskeletal:  grossly normal tone BUE/BLE, good ROM, no bony abnormality Psychiatric: Answers questions appropriately. Follows basic commands. Slow in verbal response to questions. Neurologic:  CN 2-12 grossly intact, moves all extremities in coordinated fashion, sensation intact  Data reviewed:  I have personally reviewed following labs and imaging studies Labs:  CBC:  Recent Labs Lab 06/03/16 0158 06/05/16 0216 06/06/16 0544  WBC 13.9* 9.3 10.5  HGB 10.7* 11.3* 11.9*  HCT 35.2* 36.3* 38.0*  MCV 90.5 88.8 88.0  PLT 194 178 226    Basic Metabolic Panel:  Recent Labs Lab 06/03/16 0158 06/04/16 0458 06/05/16 0216 06/06/16 0544 06/08/16 0846  NA 152* 141 149* 150* 146*  K  4.0 3.6 3.4* 3.7 3.5  CL 117* 106 113* 114* 109  CO2 28 24 27 24 25   GLUCOSE 173* 135* 136* 209* 216*  BUN 31* 11 23* 21* 12  CREATININE 1.02 0.74 1.00 0.97 0.80  CALCIUM 8.4* 8.3* 8.2* 8.3* 7.8*  MG 2.8*  --  2.4 2.4  --   PHOS 3.5  --  2.7 2.8  --    GFR Estimated Creatinine Clearance: 126.1 mL/min (by C-G formula based on SCr of 0.8 mg/dL). Liver Function Tests: No results for input(s): AST, ALT, ALKPHOS, BILITOT, PROT, ALBUMIN in the last 168 hours. No results for input(s): LIPASE, AMYLASE in the last 168 hours. No results for input(s): AMMONIA in the last 168 hours. Coagulation profile No results for input(s): INR, PROTIME in the last 168 hours.  Cardiac Enzymes: No results for input(s): CKTOTAL, CKMB, CKMBINDEX, TROPONINI in the last 168 hours.  BNP: Invalid input(s): POCBNP CBG:  Recent Labs Lab 06/08/16 2109 06/08/16 2358 06/09/16 0017 06/09/16 0444 06/09/16 0752  GLUCAP 187* 171* 169* 166* 156*   D-Dimer No results for input(s): DDIMER in the last 72 hours. Hgb A1c No results for input(s): HGBA1C in the last 72 hours. Lipid Profile  Recent Labs  06/08/16 1434  TRIG 184*   Thyroid function studies No results for input(s): TSH, T4TOTAL, T3FREE, THYROIDAB in the last 72 hours.  Invalid input(s): FREET3 Anemia work up No results for input(s): VITAMINB12, FOLATE, FERRITIN, TIBC, IRON, RETICCTPCT in the last 72 hours. Urinalysis    Component Value Date/Time   COLORURINE YELLOW 06/01/2016 1715   APPEARANCEUR HAZY (A) 06/01/2016 1715   LABSPEC 1.023 06/01/2016 1715   PHURINE 5.0 06/01/2016 1715   GLUCOSEU NEGATIVE 06/01/2016 1715   HGBUR LARGE (A) 06/01/2016 1715   BILIRUBINUR NEGATIVE 06/01/2016 1715   KETONESUR NEGATIVE 06/01/2016 1715   PROTEINUR 30 (A) 06/01/2016 1715   UROBILINOGEN 0.2 08/19/2011 2051   NITRITE NEGATIVE 06/01/2016 1715   LEUKOCYTESUR MODERATE (A) 06/01/2016 1715     Microbiology Recent Results (from the past 240 hour(s))    Culture, blood (Routine X 2) w Reflex to ID Panel     Status: None   Collection Time: 05/31/16 11:11 AM  Result Value Ref Range Status   Specimen Description BLOOD RIGHT HAND  Final   Special Requests IN PEDIATRIC BOTTLE 3CC  Final   Culture NO GROWTH 5 DAYS  Final   Report Status 06/05/2016 FINAL  Final  Culture, blood (Routine X 2) w Reflex to ID Panel     Status: None   Collection Time: 05/31/16 11:22 AM  Result Value Ref Range Status   Specimen Description BLOOD LEFT ANTECUBITAL  Final   Special Requests BOTTLES DRAWN AEROBIC ONLY 5CC  Final   Culture NO GROWTH 5 DAYS  Final   Report Status 06/05/2016 FINAL  Final  Culture, respiratory (NON-Expectorated)     Status: None   Collection Time: 06/01/16  3:45 AM  Result Value Ref Range Status   Specimen Description TRACHEAL ASPIRATE  Final   Special Requests NONE  Final   Gram Stain   Final    MODERATE WBC PRESENT,BOTH PMN AND MONONUCLEAR ABUNDANT GRAM POSITIVE COCCI FEW GRAM NEGATIVE RODS    Culture ABUNDANT STAPHYLOCOCCUS AUREUS  Final   Report Status 06/03/2016 FINAL  Final   Organism ID, Bacteria STAPHYLOCOCCUS AUREUS  Final      Susceptibility   Staphylococcus aureus - MIC*    CIPROFLOXACIN <=0.5 SENSITIVE Sensitive     ERYTHROMYCIN <=0.25 SENSITIVE Sensitive     GENTAMICIN <=0.5 SENSITIVE Sensitive     OXACILLIN <=0.25 SENSITIVE Sensitive     TETRACYCLINE <=1 SENSITIVE Sensitive     VANCOMYCIN <=0.5 SENSITIVE Sensitive     TRIMETH/SULFA <=10 SENSITIVE Sensitive     CLINDAMYCIN <=0.25 SENSITIVE Sensitive     RIFAMPIN <=0.5 SENSITIVE Sensitive     Inducible Clindamycin NEGATIVE Sensitive     * ABUNDANT STAPHYLOCOCCUS AUREUS       Inpatient Medications:   Scheduled Meds: . aspirin  81 mg Per Tube Daily  . atorvastatin  80 mg Per Tube q1800  . carvedilol  6.25 mg Oral BID WC  .  ceFAZolin (ANCEF) IV  2 g Intravenous Q8H  . chlorhexidine  15 mL Mouth Rinse BID  . docusate  100 mg Per Tube BID  . famotidine  20  mg Oral Daily  . feeding supplement (  PRO-STAT SUGAR FREE 64)  30 mL Per Tube Daily  . free water  200 mL Per Tube QID  . heparin  5,000 Units Subcutaneous Q8H  . insulin aspart  0-15 Units Subcutaneous Q4H  . levETIRAcetam  1,000 mg Intravenous Q12H  . lisinopril  5 mg Oral Daily  . mouth rinse  15 mL Mouth Rinse q12n4p  . multivitamin with minerals  1 tablet Oral Daily  . torsemide  20 mg Oral Daily   Continuous Infusions: . feeding supplement (OSMOLITE 1.2 CAL) 1,000 mL (06/07/16 1500)     Radiological Exams on Admission: Dg Swallowing Func-speech Pathology  Result Date: 06/08/2016 Objective Swallowing Evaluation: Type of Study: MBS-Modified Barium Swallow Study Patient Details Name: CHUCK CABAN MRN: [id number] Date of Birth: 10/07/1965 Today's Date: 06/08/2016 Time: SLP Start Time (ACUTE ONLY): 1214-SLP Stop Time (ACUTE ONLY): 1231 SLP Time Calculation (min) (ACUTE ONLY): 17 min Past Medical History: Past Medical History: Diagnosis Date . Acute ST elevation myocardial infarction (STEMI) involving left circumflex coronary artery (HCC) 05/07/2016  PCI to Cx-OM . Anginal pain (HCC)   secondary to sm. vessel disease . Anxiety  . Arthritis   BIL KNEE PAIN AND BIL ANKLE PAIN . Bone spur of ankle  . Cardiac arrest (HCC) 05/24/2016  with v fib . 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 Coronary artery disease involving native coronary artery of native heart with angina pectoris (HCC) 05/24/2016  Remote MI at 51 years of age, Last cath 2010-diffuse non-obstructive disease; last echo 06/16/08 -normal LV function, moderate concentric hypertrophy; nuc 08/2008 no ischemia;  medical therapy; STEMI May 07 2016 - PCI to Cx-OM . Diabetes mellitus   ON ORAL MEDICATION AND INSULIN . Dilated cardiomyopathy (HCC) 05/24/2016  EF 325-30% by Echo post STEMI (previously 30-35%)  . Hyperlipidemia  . Hypertension  . Myocardial infarction 1996  Post MI . Peripheral  vascular disease (HCC)   HAS LEFT CAROTID ARTERY STENOSIS   AND IS S/P RIGHT CAROTID ENDARTERECTOMY 2010 Last carotid dopplers 01/08/2012 wth patent endarterectomy site . Status post coronary artery stent placement  Past Surgical History: Past Surgical History: Procedure Laterality Date . APPENDECTOMY   . CARDIAC CATHETERIZATION    FEB 2010, significant branch vessel disease wth diag, marginal, PDA & PLA, nml. LV function . CARDIAC CATHETERIZATION N/A 05/07/2016  Procedure: Left Heart Cath and Coronary Angiography;  Surgeon: Peter M Swaziland, MD;  Location: [facility] INVASIVE CV LAB;  Service: Cardiovascular;  Laterality: N/A; . CARDIAC CATHETERIZATION N/A 05/07/2016  Procedure: Coronary Stent Intervention;  Surgeon: Peter M Swaziland, MD;  Location: [facility] INVASIVE CV LAB;  Service: Cardiovascular;  Laterality: N/A; . CARDIAC CATHETERIZATION N/A 05/24/2016  Procedure: Left Heart Cath and Coronary Angiography;  Surgeon: Marykay Lex, MD;  Location: [facility] INVASIVE CV LAB;  Service: Cardiovascular;  Laterality: N/A; . CARDIAC CATHETERIZATION N/A 05/24/2016  Procedure: Coronary Stent Intervention;  Surgeon: Marykay Lex, MD;  Location: [facility] INVASIVE CV LAB;  Service: Cardiovascular;  Laterality: N/A;  2.5x20 Promus to Ostial/proximal circumflex . CAROTID ENDARTERECTOMY  09/2008  Rt CEA . LUNG BIOPSY  2013  Bx's suggest granulomatous dz. . RT ANKLE   2013 HPI: Mr. Daniel is a 51 yo male with PMH of CAD(recently admitted from 1/3-1/7 for MI, had PCI of Om2), HFrEF (EF 35%), DMII (on insulin), HLD and carotid disease. He was discharged home from that admission, but returned shortly there after with 8lb weight gain, worsening dyspnea and decreased  UOP despite full compliance with his outpatient diuretic. His BNP was 610 and weight was increased from 239-246lbs. Admitted with severe ischemic cardiomyopathy, presenting with quickly resuscitated VT/VF cardiac arrest and requiring repeat PCI to left circumflex roughly 2 weeks after initial  presentation with STEMI. Anoxic brain injury: MRI 06/02/16 showed Stable left thalamus 4 mm acute infarction. Stable few scattered chronic cortical infarcts over the convexities bilaterally. Subjective: Alert, eager to eat Assessment / Plan / Recommendation CHL IP CLINICAL IMPRESSIONS 06/08/2016 Therapy Diagnosis Severe pharyngeal phase dysphagia Clinical Impression Patient presents with severe pharyngeal dysphagia with significant silent aspiration of thin liquids (~1/2 tsp), moderate aspiration of honey-thick liquids with cough response, and moderate silent aspiration of pureed. Oral stage of swallowing appeared within functional limits. Swallow initation delayed to the level of the pyriform sinuses with thin liquids. Hyolaryngeal elevation significantly reduced, leading to incomplete epiglottic deflection, reduced amplitude and duration of UES opening, obstruction of bolus flow which spills into the laryngeal vestibule and is silently aspirated during the swallow. Moderate amount of residue remains in the valleculae/ post-cricoid segment with all consistencies trialed (thin, honey, puree), leading to penetration and ultimately aspiration after the swallow. Chin tuck was trialed x1 with thin liquids but is ineffective in preventing aspiration. Based on the above deficits, diet initiation is not advised at this time. Patient may benefit from exercises to address physiologic impairments as well as training in compensatory techniques to improve swallow function. Recommend patient remain NPO; will likely need temporary source of alternative nutrition. SLP will continue to follow.  Impact on safety and function Severe aspiration risk   CHL IP TREATMENT RECOMMENDATION 06/08/2016 Treatment Recommendations Therapy as outlined in treatment plan below   Prognosis 06/08/2016 Prognosis for Safe Diet Advancement Fair Barriers to Reach Goals Cognitive deficits;Severity of deficits Barriers/Prognosis Comment -- CHL IP DIET  RECOMMENDATION 06/08/2016 SLP Diet Recommendations NPO;Alternative means - temporary Liquid Administration via -- Medication Administration Via alternative means Compensations -- Postural Changes --   CHL IP OTHER RECOMMENDATIONS 06/08/2016 Recommended Consults -- Oral Care Recommendations Oral care QID Other Recommendations --   CHL IP FOLLOW UP RECOMMENDATIONS 06/08/2016 Follow up Recommendations Inpatient Rehab   CHL IP FREQUENCY AND DURATION 06/08/2016 Speech Therapy Frequency (ACUTE ONLY) min 2x/week Treatment Duration 2 weeks      CHL IP ORAL PHASE 06/08/2016 Oral Phase WFL Oral - Pudding Teaspoon -- Oral - Pudding Cup -- Oral - Honey Teaspoon -- Oral - Honey Cup -- Oral - Nectar Teaspoon -- Oral - Nectar Cup -- Oral - Nectar Straw -- Oral - Thin Teaspoon -- Oral - Thin Cup -- Oral - Thin Straw -- Oral - Puree -- Oral - Mech Soft -- Oral - Regular -- Oral - Multi-Consistency -- Oral - Pill -- Oral Phase - Comment --  CHL IP PHARYNGEAL PHASE 06/08/2016 Pharyngeal Phase Impaired Pharyngeal- Pudding Teaspoon -- Pharyngeal -- Pharyngeal- Pudding Cup -- Pharyngeal -- Pharyngeal- Honey Teaspoon Reduced airway/laryngeal closure;Reduced laryngeal elevation;Reduced epiglottic inversion;Penetration/Apiration after swallow;Pharyngeal residue - valleculae;Pharyngeal residue - cp segment;Moderate aspiration Pharyngeal Material enters airway, passes BELOW cords and not ejected out despite cough attempt by patient Pharyngeal- Honey Cup -- Pharyngeal -- Pharyngeal- Nectar Teaspoon -- Pharyngeal -- Pharyngeal- Nectar Cup -- Pharyngeal -- Pharyngeal- Nectar Straw -- Pharyngeal -- Pharyngeal- Thin Teaspoon Delayed swallow initiation-pyriform sinuses;Reduced laryngeal elevation;Reduced airway/laryngeal closure;Penetration/Aspiration during swallow;Penetration/Apiration after swallow;Pharyngeal residue - cp segment;Pharyngeal residue - valleculae;Significant aspiration (Amount) Pharyngeal Material enters airway, passes BELOW cords without  attempt by patient to eject out (silent aspiration)  Pharyngeal- Thin Cup -- Pharyngeal -- Pharyngeal- Thin Straw -- Pharyngeal -- Pharyngeal- Puree Reduced epiglottic inversion;Reduced laryngeal elevation;Moderate aspiration;Penetration/Apiration after swallow;Pharyngeal residue - valleculae;Pharyngeal residue - cp segment;Reduced airway/laryngeal closure Pharyngeal Material enters airway, passes BELOW cords without attempt by patient to eject out (silent aspiration) Pharyngeal- Mechanical Soft -- Pharyngeal -- Pharyngeal- Regular -- Pharyngeal -- Pharyngeal- Multi-consistency -- Pharyngeal -- Pharyngeal- Pill -- Pharyngeal -- Pharyngeal Comment --  CHL IP CERVICAL ESOPHAGEAL PHASE 06/08/2016 Cervical Esophageal Phase Impaired Pudding Teaspoon -- Pudding Cup -- Honey Teaspoon Reduced cricopharyngeal relaxation Honey Cup -- Nectar Teaspoon -- Nectar Cup -- Nectar Straw -- Thin Teaspoon Reduced cricopharyngeal relaxation Thin Cup -- Thin Straw -- Puree Reduced cricopharyngeal relaxation Mechanical Soft -- Regular -- Multi-consistency -- Pill -- Cervical Esophageal Comment appears 2/2 reduced laryngeal elevation CHL IP GO 06/04/2016 Functional Assessment Tool Used NOMS Functional Limitations Swallowing Swallow Current Status (Z6109) CM Swallow Goal Status (U0454) CL Swallow Discharge Status (U9811) (None) Motor Speech Current Status (B1478) (None) Motor Speech Goal Status (G9562) (None) Motor Speech Goal Status (Z3086) (None) Spoken Language Comprehension Current Status (V7846) (None) Spoken Language Comprehension Goal Status (N6295) (None) Spoken Language Comprehension Discharge Status (M8413) (None) Spoken Language Expression Current Status (K4401) (None) Spoken Language Expression Goal Status (U2725) (None) Spoken Language Expression Discharge Status [phone]) (None) Attention Current Status (I3474) (None) Attention Goal Status (Q5956) (None) Attention Discharge Status (L8756) (None) Memory Current Status (E3329) (None)  Memory Goal Status (J1884) (None) Memory Discharge Status (Z6606) (None) Voice Current Status (T0160) (None) Voice Goal Status (F0932) (None) Voice Discharge Status (T5573) (None) Other Speech-Language Pathology Functional Limitation (U2025) (None) Other Speech-Language Pathology Functional Limitation Goal Status (K2706) (None) Other Speech-Language Pathology Functional Limitation Discharge Status [phone]) (None) Arlana Lindau 06/08/2016, 2:37 PM Rondel Baton, MS CF-SLP Speech-Language Pathologist [phone]              Impression/Recommendations Principal Problem:   Cardiac arrest with ventricular fibrillation [facility]) Active Problems:   Essential hypertension   Status post coronary artery stent placement   Dilated cardiomyopathy (HCC)   Coronary artery disease involving native coronary artery of native heart with angina pectoris (HCC)   ST elevation myocardial infarction (STEMI) (HCC)   Acute encephalopathy   Cardiac arrest (HCC)   Anoxic brain injury (HCC)   Anoxic encephalopathy (HCC)   Seizures (HCC)   Goals of care, counseling/discussion   Palliative care encounter   Central venous catheter in place   Encounter for hospice care discussion   Palliative care by specialist   Ventilator dependent (HCC)   Acute respiratory failure with hypoxia (HCC)   Myoclonus   Anoxia   Diabetes mellitus type 2 in obese (HCC)   Aspiration pneumonia (HCC)   Acute on chronic combined systolic and diastolic CHF (congestive heart failure) (HCC)   Fever   Hypernatremia   Leukocytosis   Acute blood loss anemia   Cardiac arrest: pt primary presenting concern. Admitted on 05/24/16 after cardiac arrest. Complicated cardiac history including MI at age 78, combined diastolic and systolic CHF w/ EF 25% and Grade 2 dysfunction, STEMI w/ cath and stent placement on 05/07/16. Currently admitted to cardiology service. Currently w/ LifeVest and awaiting permanent ICD.  - Continue mgt per Cardiology  Encephalopathy:  likely secondary to anoxic injury from cardiac arrest resulting in L thalamic ischemic stroke. Slow improvement per family. No focal deficits other than dysphagia (prolonged intubation). Questionable reports of seizure activity related to Mycoclonus from [facility]. Nuero signed off on 06/03/16 -  carotid dopplers, CTA head - Continue Keppra (Clonazepam and depakote if needed)  Acute respiratory failure: Intubated on 05/24/2016 due to cardiac arrest and failure to preserve airway. Filled extubation on 06/01/2016. Successful extubation on 06/04/2016. Started on Ancef on 1/31 after finding pansensitive MSSA on trach aspirate (DC Vanc/Zos).  - continue Ancef until 06/11/16 - O2 PRN  Aspiration/Dysphagia: Severe. Ongoing since extubation. Susptect due to combination of thalamic stroke and being intubated for 11 days. SLP continues to follow.  - Swallow exercises per SLP - insertion ofCortrak tube  - Nutrition consult - May need to consider PEG tube if not improving  Physical deconditioning: severe. Due to cardiac events and thalamic stroke and prolonged intubation and sedation. Not a candidate for CIR due to innability to participate in rehab - PT/OT.  - Likely to need LTAC - Air overlay - Continue Rectal tube until more mobile and/or diarrhea and stooling improves.   Urinary retention: Foley inserted on 06/07/16 due to >1L in bladder.  - Continue Foley until 06/14/16 then consider voiding trial  Hypernatremia: 146 on 06/08/16. Pt pulled NG tube. IV dependent due to aspiration and likely volume contracted.  - Change from NS to  D5 1/2 NS.  - BMT in am  DM: - continue SSI  Thank you for this consultation.  Our [facility] hospitalist team will follow the patient with you.   Time Spent: >60 min  Vinton Layson J M.D. [facility] 06/09/2016, 9:35 AM

## 2016-06-09 NOTE — Progress Notes (Signed)
Speech Language Pathology Treatment: Dysphagia  Patient Details Name: Margarita SermonsClifford M Ernandez MRN: [id number] DOB: 05-16-1965 Today's Date: 06/09/2016 Time: [id number] SLP Time Calculation (min) (ACUTE ONLY): 34 min  Assessment / Plan / Recommendation Clinical Impression  Patient seen for swallow treatment this am. Mother and two best friends present and supportive. Patient continues to present with evidence of a severe dysphagia with suspect intubation and neuro related origins. Vocal quality remains significantly hoarse although improving per family/friends. Oral care provided and patient able to self feed ice chips with min HOH assist, delayed oral transit of bolus, suspected delayed swallow initiation, and moderate s/s of aspiration characterized by wet vocal quality, cough post swallow. Moderate verbal and visual cueing provided for use of effortful swallow during trials to facilitate improved laryngeal/pharyngeal strength. Additionally, SLP introduced chin tuck again resistance as another means of improving laryngeal strength. Patient able to return demonstration of exercise with moderate verbal and visual cueing. Recommend completion of these exercises 2-3 set of 10 2-3 times per day to facilitate improved function as well as allowance of ice chips after oral care. Education complete on rationale for oral care prior to ice chip trials with mother and friends. Mother verbalized desire to assist with completion of oral care and provide ice chips. Outside of room, family friends expressed concern over mothers competency to do so. At this time, will f/u with wife for decision making. Will return later today.    HPI HPI: [name] is a 51 yo male with PMH of CAD(recently admitted from 1/3-1/7 for MI, had PCI of Om2), HFrEF (EF 35%), DMII (on insulin), HLD and carotid disease. He was discharged home from that admission, but returned shortly there after with 8lb weight gain, worsening dyspnea and decreased  UOP despite full compliance with his outpatient diuretic. His BNP was 610 and weight was increased from 239-246lbs. Admitted with severe ischemic cardiomyopathy, presenting with quickly resuscitated VT/VF cardiac arrest and requiring repeat PCI to left circumflex roughly 2 weeks after initial presentation with STEMI. Anoxic brain injury: MRI 06/02/16 showed Stable left thalamus 4 mm acute infarction. Stable few scattered chronic cortical infarcts over the convexities bilaterally.      SLP Plan  Continue with current plan of care     Recommendations  Diet recommendations: NPO (except ice chips after oral care) Medication Administration: Via alternative means                General recommendations: Rehab consult Oral Care Recommendations: Oral care QID;Oral care prior to ice chip/H20 Follow up Recommendations: Inpatient Rehab Plan: Continue with current plan of care       GO             [facility] Latrece Nitta MA, CCC-SLP [phone]    Ferdinand LangoMcCoy Estephani Popper Meryl 06/09/2016, 4:03 PM

## 2016-06-09 NOTE — Progress Notes (Signed)
Dr. Toya SmothersMerell called and updated that CTA  Has resulted. MD called back and indicated he has read the report.

## 2016-06-09 NOTE — Progress Notes (Signed)
   Extensive occlusive diesease noted on CTA head and carotid dopplers. Likely to need further vascular studies given the fact that the extent of the thrombus has not been fully visualized. Consider CTA neck and chest in am when renal function evaluated again. Vascular consulted - Dr Randie Heinzain.   Awaiting return of wife to pts bedside to update on condition. RN to page when arrives. Will also discuss medical decision making as there is a mother and two friends who all seem to want to be involved in pts overall care but have differing opinions.    Shelly Flattenavid Shianna Bally, MD [facility] 06/09/2016, 2:03 PM

## 2016-06-09 NOTE — Consult Note (Signed)
Hospital Consult    Reason for Consult:  Occluded Right carotid artery Referring Physician:  Dr. Konrad DoloresMerrell MRN #:  [id number]  History of Present Illness: This is a 51 y.o. male with history of R CEA several years ago now admitted following cardiac arrest and undergoing hypothermia protocol. He has had some neuro deficits for which carotid dopplers were checked as well as CTA of the head that demonstrated an occluded right ICA. Per his most recent MRI there were bilateral cortical infarcts. Per the family he is progressing well from a neurologic standpoint and has recognized them consistently. He continues to move all of his extremities with strength deficit only. He does not have new complaints today.  Past Medical History:  Diagnosis Date  . Acute ST elevation myocardial infarction (STEMI) involving left circumflex coronary artery (HCC) 05/07/2016   PCI to Cx-OM  . Anginal pain (HCC)    secondary to sm. vessel disease  . Anxiety   . Arthritis    BIL KNEE PAIN AND BIL ANKLE PAIN  . Bone spur of ankle   . Cardiac arrest (HCC) 05/24/2016   with v fib  . 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 Coronary artery disease involving native coronary artery of native heart with angina pectoris (HCC) 05/24/2016   Remote MI at 51 years of age, Last cath 2010-diffuse non-obstructive disease; last echo 06/16/08 -normal LV function, moderate concentric hypertrophy; nuc 08/2008 no ischemia;  medical therapy; STEMI May 07 2016 - PCI to Cx-OM  . Diabetes mellitus    ON ORAL MEDICATION AND INSULIN  . Dilated cardiomyopathy (HCC) 05/24/2016   EF 325-30% by Echo post STEMI (previously 30-35%)   . Hyperlipidemia   . Hypertension   . Myocardial infarction 1996   Post MI  . Peripheral vascular disease (HCC)    HAS LEFT CAROTID ARTERY STENOSIS   AND IS S/P RIGHT CAROTID ENDARTERECTOMY 2010 Last carotid dopplers 01/08/2012 wth patent endarterectomy site  .  Status post coronary artery stent placement     Past Surgical History:  Procedure Laterality Date  . APPENDECTOMY    . CARDIAC CATHETERIZATION     FEB 2010, significant branch vessel disease wth diag, marginal, PDA & PLA, nml. LV function  . CARDIAC CATHETERIZATION N/A 05/07/2016   Procedure: Left Heart Cath and Coronary Angiography;  Surgeon: Peter M SwazilandJordan, MD;  [facility];  Service: Cardiovascular;  Laterality: N/A;  . CARDIAC CATHETERIZATION N/A 05/07/2016   Procedure: Coronary Stent Intervention;  Surgeon: Peter M SwazilandJordan, MD;  [facility];  Service: Cardiovascular;  Laterality: N/A;  . CARDIAC CATHETERIZATION N/A 05/24/2016   Procedure: Left Heart Cath and Coronary Angiography;  Surgeon: Marykay Lexavid W Harding, MD;  [facility];  Service: Cardiovascular;  Laterality: N/A;  . CARDIAC CATHETERIZATION N/A 05/24/2016   Procedure: Coronary Stent Intervention;  Surgeon: Marykay Lexavid W Harding, MD;  [facility];  Service: Cardiovascular;  Laterality: N/A;  2.5x20 Promus to Ostial/proximal circumflex  . CAROTID ENDARTERECTOMY  09/2008   Rt CEA  . LUNG BIOPSY  2013   Bx's suggest granulomatous dz.  . RT ANKLE   2013    No Known Allergies  Prior to Admission medications   Medication Sig Start Date End Date Taking? Authorizing Provider  aspirin 81 MG chewable tablet Chew 1 tablet (81 mg total) by mouth daily. 05/12/16  Yes Luke K Kilroy, PA-C  atorvastatin (LIPITOR)  80 MG tablet Take 1 tablet (80 mg total) by mouth daily at 6 PM. 05/11/16  Yes Abelino Derrick, PA-C  carvedilol (COREG) 6.25 MG tablet Take 1 tablet (6.25 mg total) by mouth 2 (two) times daily with a meal. 05/11/16  Yes Eda Paschal Kilroy, PA-C  insulin aspart (NOVOLOG FLEXPEN) 100 UNIT/ML FlexPen Inject 6 to 10 units 3 times a day prior to each meal and as needed for blood sugar greater than 200 Patient taking differently: Inject 0-10 Units into the skin See admin instructions. Three times a  day prior to each meal as needed for blood sugar greater than 200 11/14/15  Yes [name], MD  Insulin Degludec (TRESIBA FLEXTOUCH) 200 UNIT/ML SOPN Inject 160 Units into the skin daily. 11/15/15  Yes [name], MD  isosorbide mononitrate (IMDUR) 30 MG 24 hr tablet Take 1 tablet (30 mg total) by mouth daily. 05/12/16  Yes Luke K Kilroy, PA-C  losartan (COZAAR) 100 MG tablet Take 1 tablet (100 mg total) by mouth daily. 05/12/16  Yes Luke K Kilroy, PA-C  nitroGLYCERIN (NITROSTAT) 0.4 MG SL tablet Place 1 tablet (0.4 mg total) under the tongue every 5 (five) minutes x 3 doses as needed for chest pain. 05/11/16  Yes Luke K Kilroy, PA-C  potassium chloride SA (K-DUR,KLOR-CON) 20 MEQ tablet Take 1 tablet (20 mEq total) by mouth daily. 05/12/16  Yes Luke K Kilroy, PA-C  spironolactone (ALDACTONE) 25 MG tablet Take 0.5 tablets (12.5 mg total) by mouth daily. 05/12/16  Yes Abelino Derrick, PA-C  ticagrelor (BRILINTA) 90 MG TABS tablet Take 1 tablet (90 mg total) by mouth 2 (two) times daily. 05/11/16  Yes Luke K Kilroy, PA-C  torsemide (DEMADEX) 20 MG tablet Take 1 tablet (20 mg total) by mouth 2 (two) times daily. 05/15/16  Yes Berton Bon, NP  acetaminophen (TYLENOL) 325 MG tablet Take 2 tablets (650 mg total) by mouth every 4 (four) hours as needed for headache or mild pain. Patient not taking: Reported on 05/24/2016 05/11/16   Abelino Derrick, PA-C    Social History   Social History  . Marital status: Married    Spouse name: Erskine Squibb  . Number of children: 3  . Years of education: N/A   Occupational History  . inspector for [employer]    Social History Main Topics  . Smoking status: Former Smoker    Packs/day: 2.00    Years: 22.00    Types: Cigarettes    Quit date: 10/23/2011  . Smokeless tobacco: Never Used  . Alcohol use 0.0 oz/week    2 - 3 Cans of beer per week     Comment: occasional  . Drug use: No  . Sexual activity: Not on file   Other Topics Concern  . Not on file   Social History  Narrative   Pt lives with family in [place], [place].     Family History  Problem Relation Age of Onset  . Hypertension Mother   . Diabetes Mother   . Hypertension Maternal Grandfather   . Heart attack Paternal Grandfather   . Heart attack Father 56    ROS: [x]  Positive   [ ]  Negative   [ ]  All sytems reviewed and are negative  Cardiovascular: []  chest pain/pressure []  palpitations []  SOB lying flat []  DOE []  pain in legs while walking []  pain in legs at rest []  pain in legs at night []  non-healing ulcers []  hx of DVT []  swelling in  legs  Pulmonary: []  productive cough []  asthma/wheezing []  home O2  Neurologic: [x]  weakness in []  arms []  legs []  numbness in []  arms []  legs []  hx of CVA []  mini stroke [] difficulty speaking or slurred speech []  temporary loss of vision in one eye []  dizziness  Hematologic: []  hx of cancer []  bleeding problems []  problems with blood clotting easily  Endocrine:   []  diabetes []  thyroid disease  GI []  vomiting blood []  blood in stool  GU: []  CKD/renal failure []  HD--[]  M/W/F or []  T/T/S []  burning with urination []  blood in urine  Psychiatric: []  anxiety []  depression  Musculoskeletal: []  arthritis []  joint pain  Integumentary: []  rashes []  ulcers  Constitutional: []  fever []  chills   Physical Examination  Vitals:   06/09/16 0438 06/09/16 0446  BP: (!) 141/81 122/72  Pulse: 74 76  Resp: 18 19  Temp:     Body mass index is 30.42 kg/m.  General:  WDWN in NAD Gait: Not observed HENT: WNL, normocephalic Pulmonary: normal non-labored breathing Cardiac: palpable radial pulses Abdomen: soft, NT/ND, no masses Extremities: without ischemic changes, without Gangrene , without cellulitis; without open wounds;  Musculoskeletal: no muscle wasting or atrophy  Neurologic: oriented x 3, moving all 4 extremities   CBC    Component Value Date/Time   WBC 10.5 06/06/2016 0544   RBC 4.32 06/06/2016 0544   HGB 11.9  (L) 06/06/2016 0544   HCT 38.0 (L) 06/06/2016 0544   PLT 226 06/06/2016 0544   MCV 88.0 06/06/2016 0544   MCH 27.5 06/06/2016 0544   MCHC 31.3 06/06/2016 0544   RDW 12.9 06/06/2016 0544   LYMPHSABS 3.9 05/24/2016 1537   MONOABS 0.5 05/24/2016 1537   EOSABS 0.2 05/24/2016 1537   BASOSABS 0.0 05/24/2016 1537    BMET    Component Value Date/Time   NA 146 (H) 06/08/2016 0846   NA 145 (H) 05/24/2016 0822   K 3.5 06/08/2016 0846   CL 109 06/08/2016 0846   CO2 25 06/08/2016 0846   GLUCOSE 216 (H) 06/08/2016 0846   BUN 12 06/08/2016 0846   BUN 29 (H) 05/24/2016 0822   CREATININE 0.80 06/08/2016 0846   CALCIUM 7.8 (L) 06/08/2016 0846   GFRNONAA >60 06/08/2016 0846   GFRAA >60 06/08/2016 0846    COAGS: Lab Results  Component Value Date   INR 1.13 05/25/2016   INR 1.07 05/24/2016   INR 1.07 05/07/2016     Non-Invasive Vascular Imaging:   Summary: There is acute thrombus from origin right CCA throughout the visualized portion of the ICA. Left: significant plaque vs. thrombus noted proximal ICA. 60-79% stenosis.  MRI brain IMPRESSION: 1. Stable left thalamus 4 mm acute infarction. No new acute infarct or acute intracranial hemorrhage. 2. Stable few scattered chronic cortical infarcts over the convexities bilaterally. 3. No structural cause of seizure identified.  IMPRESSION: 1. 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 The occlusion is favored to be subacute, given the retrospectively asymmetric appearance of the internal carotid flow voids on the prior MRI studies dating back to 05/24/2016. CTA of the neck could be performed to determine the most proximal extent of the occlusion. 2. Otherwise normal CTA of the brain. 3. Faint hypoattenuation at the site of previously demonstrated small left thalamic  infarct.  ASSESSMENT/PLAN: This is a 51 y.o. male with history of R  CEA now s/p cardiac arrest and found to have occluded his CEA site without significant sequela. As such he would not benefit from intervention at this time. Will need f/u in office for left sided stenosis on duplex and get that scheduled for 6 months. Otherwise medical management unless his course changes. I have discussed with wife and son and questions were answered.   Brandon C. Randie Heinz, MD [facility] Office: [phone] Pager: [phone]

## 2016-06-09 NOTE — Progress Notes (Addendum)
Nutrition Follow-up  DOCUMENTATION CODES:   Obesity unspecified  INTERVENTION:    Once Cortrak placement confirmed, resume Osmolite 1.2 @ 70 ml/hr (1680 ml/day)  30 ml Prostat daily  Free water flushes 125 ml every 4 hours  Provides: 2116 kcal, 108 grams protein, and 2112 ml free water   NUTRITION DIAGNOSIS:   Inadequate oral intake related to inability to eat as evidenced by NPO status.  Ongoing  GOAL:   Patient will meet greater than or equal to 90% of their needs  Met with TF  MONITOR:   TF tolerance, I & O's  REASON FOR ASSESSMENT:   Consult Enteral/tube feeding initiation and management  ASSESSMENT:   51 yo male with PMH of CAD(recently admitted from 1/3-1/10fr MI, had PCI of Om2), HFrEF (EF 35%), DMII (on insulin), HLD and carotid disease. Pt admitted with OOH arrest, now with onset seizures.   S/P MBS 2/4; SLP recommends continuing NPO status with short term alternate means of feeding due to ongoing silent aspiration. Hopeful for diet advancement soon, but may require PEG if unable to safely advance diet. Cortrak feeding tube was pulled out by patient on 2/4, replaced today. Awaiting abd xray to confirm placement. RN reports no feeding issues relayed from previous RTherapist, sports Stools remain liquid consistency. Rectal tube in place.  Diet Order:   NPO  Skin:  Reviewed, no issues  Last BM:  2/5 (flexiseal)  Height:   Ht Readings from Last 1 Encounters:  05/24/16 5' 9"  (1.753 m)    Weight:   Wt Readings from Last 1 Encounters:  06/09/16 206 lb (93.4 kg)   06/05/16 227 lb 11.8 oz (103.3 kg)    Ideal Body Weight:  72.7 kg  BMI:  Body mass index is 30.42 kg/m.  Estimated Nutritional Needs:   Kcal:  2000-2200  Protein:  105-115 grams  Fluid:  2 L/day  EDUCATION NEEDS:   No education needs identified at this time  KMolli Barrows RMurrysville LFrench Camp CStoney PointPager [phone] Hours Pager [phone]

## 2016-06-10 ENCOUNTER — Telehealth: Payer: Self-pay | Admitting: Vascular Surgery

## 2016-06-10 DIAGNOSIS — A4901 Methicillin susceptible Staphylococcus aureus infection, unspecified site: Secondary | ICD-10-CM

## 2016-06-10 LAB — BASIC METABOLIC PANEL
Anion gap: 11 (ref 5–15)
BUN: 18 mg/dL (ref 6–20)
CO2: 23 mmol/L (ref 22–32)
CREATININE: 0.96 mg/dL (ref 0.61–1.24)
Calcium: 8 mg/dL — ABNORMAL LOW (ref 8.9–10.3)
Chloride: 109 mmol/L (ref 101–111)
GFR calc Af Amer: >60 mL/min (ref >60)
GFR calc non Af Amer: >60 mL/min (ref >60)
GLUCOSE: 284 mg/dL — AB (ref 65–99)
POTASSIUM: 4.1 mmol/L (ref 3.5–5.1)
Sodium: 143 mmol/L (ref 135–145)

## 2016-06-10 LAB — GLUCOSE, CAPILLARY
GLUCOSE-CAPILLARY: 283 mg/dL — AB (ref 65–99)
Glucose-Capillary: 216 mg/dL — ABNORMAL HIGH (ref 65–99)
Glucose-Capillary: 234 mg/dL — ABNORMAL HIGH (ref 65–99)
Glucose-Capillary: 245 mg/dL — ABNORMAL HIGH (ref 65–99)
Glucose-Capillary: 263 mg/dL — ABNORMAL HIGH (ref 65–99)
Glucose-Capillary: 269 mg/dL — ABNORMAL HIGH (ref 65–99)

## 2016-06-10 LAB — CBC
HEMATOCRIT: 36.2 % — AB (ref 39.0–52.0)
Hemoglobin: 11 g/dL — ABNORMAL LOW (ref 13.0–17.0)
MCH: 26.6 pg (ref 26.0–34.0)
MCHC: 30.4 g/dL (ref 30.0–36.0)
MCV: 87.7 fL (ref 78.0–100.0)
PLATELETS: 300 10*3/uL (ref 150–400)
RBC: 4.13 MIL/uL — ABNORMAL LOW (ref 4.22–5.81)
RDW: 13.2 % (ref 11.5–15.5)
WBC: 9.3 10*3/uL (ref 4.0–10.5)

## 2016-06-10 MED ORDER — INSULIN GLARGINE 100 UNIT/ML ~~LOC~~ SOLN
15.0000 [IU] | Freq: Every day | SUBCUTANEOUS | Status: DC
  Administered 2016-06-10: 15 [IU] via SUBCUTANEOUS
  Filled 2016-06-10: qty 0.15

## 2016-06-10 MED ORDER — INSULIN ASPART 100 UNIT/ML ~~LOC~~ SOLN
4.0000 [IU] | Freq: Three times a day (TID) | SUBCUTANEOUS | Status: DC
  Administered 2016-06-14 – 2016-06-17 (×9): 4 [IU] via SUBCUTANEOUS

## 2016-06-10 NOTE — Progress Notes (Addendum)
I met with pt and his wife at bedside to discuss his progress since original consult on 06/02/16 with Dr. Posey Pronto. He was not working with therapies at that time and was re intubated soon after that original consult. I discussed rehab venue options pending caregiver support needed after an extensive hospitalization, as well as [employer] [place] insurance coverage. [employer] will not cover both inpt rehab as well as SNF after inpt rehab stay. Spouse states she is working half days and can not provide 24/7 min assist which will be required at d/c. Therefore we are recommending SNF rehab at this time. If wife can arrange 24/7 min physical assist after a short rehab stay, please contact me to discuss further.  I have discussed with RN CM and SW. We will sign off. Please call me with any questions. [phone]

## 2016-06-10 NOTE — Progress Notes (Signed)
Progress Note  Patient Name: Douglas Carr Date of Encounter: 06/10/2016  Primary Cardiologist: Allyson SabalBerry  Subjective   Alert, able to report his own name and those of his family and friends. Is aware of his situation. He jokes that he wants a big fat steak.   Inpatient Medications    Scheduled Meds: . aspirin  81 mg Per Tube Daily  . atorvastatin  80 mg Per Tube q1800  . carvedilol  6.25 mg Oral BID WC  .  ceFAZolin (ANCEF) IV  2 g Intravenous Q8H  . chlorhexidine  15 mL Mouth Rinse BID  . docusate  100 mg Per Tube BID  . famotidine  20 mg Oral Daily  . feeding supplement (PRO-STAT SUGAR FREE 64)  30 mL Per Tube Daily  . free water  200 mL Per Tube QID  . heparin  5,000 Units Subcutaneous Q8H  . insulin aspart  0-15 Units Subcutaneous Q4H  . levETIRAcetam  1,000 mg Intravenous Q12H  . lisinopril  5 mg Oral Daily  . mouth rinse  15 mL Mouth Rinse q12n4p  . multivitamin with minerals  1 tablet Oral Daily  . torsemide  20 mg Oral Daily   Continuous Infusions: . dextrose 5 % and 0.45% NaCl 1,000 mL (06/09/16 1145)  . feeding supplement (OSMOLITE 1.2 CAL) 1,000 mL (06/07/16 1500)   PRN Meds: acetaminophen, acetaminophen, ondansetron (ZOFRAN) IV, pneumococcal 23 valent vaccine   Vital Signs    Vitals:   06/09/16 1900 06/10/16 0007 06/10/16 0447 06/10/16 0449  BP: 103/67 102/62 109/63 109/63  Pulse: 81 79 78 77  Resp: 19 18    Temp: 98.5 F (36.9 C) 97.7 F (36.5 C) 99.1 F (37.3 C)   TempSrc: Oral Oral Oral   SpO2: 97% 95% 96% 97%  Weight:   216 lb 11.2 oz (98.3 kg)   Height:        Intake/Output Summary (Last 24 hours) at 06/10/16 0841 Last data filed at 06/10/16 [id number]  Gross per 24 hour  Intake             1855 ml  Output                0 ml  Net             1855 ml   Filed Weights   06/08/16 0356 06/09/16 0440 06/10/16 0447  Weight: 208 lb (94.3 kg) 206 lb (93.4 kg) 216 lb 11.2 oz (98.3 kg)    Telemetry    NSR with rates in the 70's and 80's-  Personally Reviewed  ECG    05/25/2016, NSR, extensive old anterior MI, lateral ST-T changes - Personally Reviewed  Physical Exam  Alert, only able to speak in a hoarse voice, very short usually monosyllabic utterances, but these appear to be appropriate and he follows commands GEN: No acute distress.   Neck: No JVD Cardiac: RRR, no murmurs, rubs, or gallops.  Respiratory: Clear to auscultation bilaterally. GI: Soft, nontender, non-distended, feeding tube in place via nose MS: No edema; No deformity. Neuro:   muscle weakness and ataxia Psych: Normal affect   Labs    Chemistry  Recent Labs Lab 06/06/16 0544 06/08/16 0846 06/10/16 0423  NA 150* 146* 143  K 3.7 3.5 4.1  CL 114* 109 109  CO2 24 25 23   GLUCOSE 209* 216* 284*  BUN 21* 12 18  CREATININE 0.97 0.80 0.96  CALCIUM 8.3* 7.8* 8.0*  GFRNONAA >60 >60 >60  GFRAA >  60 >60 >60  ANIONGAP 12 12 11      Hematology  Recent Labs Lab 06/05/16 0216 06/06/16 0544 06/10/16 0423  WBC 9.3 10.5 9.3  RBC 4.09* 4.32 4.13*  HGB 11.3* 11.9* 11.0*  HCT 36.3* 38.0* 36.2*  MCV 88.8 88.0 87.7  MCH 27.6 27.5 26.6  MCHC 31.1 31.3 30.4  RDW 13.3 12.9 13.2  PLT 178 226 300     Radiology    Ct Angio Head W Or Wo Contrast  Result Date: 06/09/2016 CLINICAL DATA:  Stroke. Cardiac arrest on January 20th. Anoxic brain injury. EXAM: CT ANGIOGRAPHY HEAD TECHNIQUE: Multidetector CT imaging of the head was performed using the standard protocol during bolus administration of intravenous contrast. Multiplanar CT image reconstructions and MIPs were obtained to evaluate the vascular anatomy. CONTRAST:  50 mL Isovue 370 IV COMPARISON:  Brain MRI 06/02/2016, 05/28/2016 FINDINGS: CT HEAD Brain: No mass lesion, intraparenchymal hemorrhage or extra-axial collection. No evidence of acute cortical infarct. Faint hypoattenuation at the left thalamus and cerebral peduncle at the site of recently demonstrated acute infarct. No other focal parenchymal  abnormality. Vascular: No hyperdense vessel or unexpected calcification. Skull: Normal visualized skull base, calvarium and extracranial soft tissues. Sinuses/Orbits: No sinus fluid levels or advanced mucosal thickening. No mastoid effusion. Normal orbits. CTA HEAD Anterior circulation: --Intracranial internal carotid arteries: The visualized portion of the right internal carotid artery, beginning just inferior to the skull base, is occluded. The left ICA is normal. --Anterior cerebral arteries: Normal. --Middle cerebral arteries: Normal. --Posterior communicating arteries: Absent bilaterally. Posterior circulation: --Posterior cerebral arteries: Normal. --Superior cerebellar arteries: Normal. --Basilar artery: Normal. --Anterior inferior cerebellar arteries: Not clearly visualized, which is not uncommon. --Posterior inferior cerebellar arteries: Normal. Venous sinuses: As permitted by contrast timing, patent. Anatomic variants: None. Delayed phase: No abnormal parenchymal enhancement. IMPRESSION: 1. 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 The occlusion is favored to be subacute, given the retrospectively asymmetric appearance of the internal carotid flow voids on the prior MRI studies dating back to 05/24/2016. CTA of the neck could be performed to determine the most proximal extent of the occlusion. 2. Otherwise normal CTA of the brain. 3. Faint hypoattenuation at the site of previously demonstrated small left thalamic infarct. These results will be called to the ordering clinician or representative by the Radiologist Assistant, and communication documented in the PACS or zVision Dashboard. Electronically Signed   By: Deatra Robinson M.D.   On: 06/09/2016 13:46   Dg Abd Portable 1v  Result Date: 06/09/2016 CLINICAL DATA:  Feeding tube placement.  EXAM: PORTABLE ABDOMEN - 1 VIEW COMPARISON:  None. FINDINGS: Feeding tube tip is in the third portion of the duodenum in excellent position. Bowel gas pattern is normal. No acute bone abnormality. Contrast in the renal collecting systems from recent CT scan. IMPRESSION: Feeding tube tip in the third portion of the duodenum in good position. Electronically Signed   By: Francene Boyers M.D.   On: 06/09/2016 16:05   Dg Swallowing Func-speech Pathology  Result Date: 06/08/2016 Objective Swallowing Evaluation: Type of Study: MBS-Modified Barium Swallow Study Patient Details Name: THARUN CAPPELLA MRN: [id number] Date of Birth: 08-01-1965 Today's Date: 06/08/2016 Time: SLP Start Time (ACUTE ONLY): 1214-SLP Stop Time (ACUTE ONLY): 1231 SLP Time Calculation (min) (ACUTE ONLY): 17 min Past Medical History: Past Medical History: Diagnosis Date . Acute ST elevation myocardial infarction (  STEMI) involving left circumflex coronary artery (HCC) 05/07/2016  PCI to Cx-OM . Anginal pain (HCC)   secondary to sm. vessel disease . Anxiety  . Arthritis   BIL KNEE PAIN AND BIL ANKLE PAIN . Bone spur of ankle  . Cardiac arrest (HCC) 05/24/2016  with v fib . 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 Coronary artery disease involving native coronary artery of native heart with angina pectoris (HCC) 05/24/2016  Remote MI at 51 years of age, Last cath 2010-diffuse non-obstructive disease; last echo 06/16/08 -normal LV function, moderate concentric hypertrophy; nuc 08/2008 no ischemia;  medical therapy; STEMI May 07 2016 - PCI to Cx-OM . Diabetes mellitus   ON ORAL MEDICATION AND INSULIN . Dilated cardiomyopathy (HCC) 05/24/2016  EF 325-30% by Echo post STEMI (previously 30-35%)  . Hyperlipidemia  . Hypertension  . Myocardial infarction 1996  Post MI . Peripheral vascular disease (HCC)   HAS LEFT CAROTID ARTERY STENOSIS   AND IS S/P RIGHT CAROTID ENDARTERECTOMY 2010 Last carotid dopplers  01/08/2012 wth patent endarterectomy site . Status post coronary artery stent placement  Past Surgical History: Past Surgical History: Procedure Laterality Date . APPENDECTOMY   . CARDIAC CATHETERIZATION    FEB 2010, significant branch vessel disease wth diag, marginal, PDA & PLA, nml. LV function . CARDIAC CATHETERIZATION N/A 05/07/2016  Procedure: Left Heart Cath and Coronary Angiography;  Surgeon: Peter M Swaziland, MD;  Location: [facility] INVASIVE CV LAB;  Service: Cardiovascular;  Laterality: N/A; . CARDIAC CATHETERIZATION N/A 05/07/2016  Procedure: Coronary Stent Intervention;  Surgeon: Peter M Swaziland, MD;  Location: [facility] INVASIVE CV LAB;  Service: Cardiovascular;  Laterality: N/A; . CARDIAC CATHETERIZATION N/A 05/24/2016  Procedure: Left Heart Cath and Coronary Angiography;  Surgeon: Marykay Lex, MD;  Location: [facility] INVASIVE CV LAB;  Service: Cardiovascular;  Laterality: N/A; . CARDIAC CATHETERIZATION N/A 05/24/2016  Procedure: Coronary Stent Intervention;  Surgeon: Marykay Lex, MD;  Location: [facility] INVASIVE CV LAB;  Service: Cardiovascular;  Laterality: N/A;  2.5x20 Promus to Ostial/proximal circumflex . CAROTID ENDARTERECTOMY  09/2008  Rt CEA . LUNG BIOPSY  2013  Bx's suggest granulomatous dz. . RT ANKLE   2013 HPI: Mr. Hoon is a 51 yo male with PMH of CAD(recently admitted from 1/3-1/7 for MI, had PCI of Om2), HFrEF (EF 35%), DMII (on insulin), HLD and carotid disease. He was discharged home from that admission, but returned shortly there after with 8lb weight gain, worsening dyspnea and decreased UOP despite full compliance with his outpatient diuretic. His BNP was 610 and weight was increased from 239-246lbs. Admitted with severe ischemic cardiomyopathy, presenting with quickly resuscitated VT/VF cardiac arrest and requiring repeat PCI to left circumflex roughly 2 weeks after initial presentation with STEMI. Anoxic brain injury: MRI 06/02/16 showed Stable left thalamus 4 mm acute infarction. Stable few scattered  chronic cortical infarcts over the convexities bilaterally. Subjective: Alert, eager to eat Assessment / Plan / Recommendation CHL IP CLINICAL IMPRESSIONS 06/08/2016 Therapy Diagnosis Severe pharyngeal phase dysphagia Clinical Impression Patient presents with severe pharyngeal dysphagia with significant silent aspiration of thin liquids (~1/2 tsp), moderate aspiration of honey-thick liquids with cough response, and moderate silent aspiration of pureed. Oral stage of swallowing appeared within functional limits. Swallow initation delayed to the level of the pyriform sinuses with thin liquids. Hyolaryngeal elevation significantly reduced, leading to incomplete epiglottic deflection, reduced amplitude and duration of UES opening, obstruction of bolus flow which spills into the laryngeal vestibule and is  silently aspirated during the swallow. Moderate amount of residue remains in the valleculae/ post-cricoid segment with all consistencies trialed (thin, honey, puree), leading to penetration and ultimately aspiration after the swallow. Chin tuck was trialed x1 with thin liquids but is ineffective in preventing aspiration. Based on the above deficits, diet initiation is not advised at this time. Patient may benefit from exercises to address physiologic impairments as well as training in compensatory techniques to improve swallow function. Recommend patient remain NPO; will likely need temporary source of alternative nutrition. SLP will continue to follow.  Impact on safety and function Severe aspiration risk   CHL IP TREATMENT RECOMMENDATION 06/08/2016 Treatment Recommendations Therapy as outlined in treatment plan below   Prognosis 06/08/2016 Prognosis for Safe Diet Advancement Fair Barriers to Reach Goals Cognitive deficits;Severity of deficits Barriers/Prognosis Comment -- CHL IP DIET RECOMMENDATION 06/08/2016 SLP Diet Recommendations NPO;Alternative means - temporary Liquid Administration via -- Medication Administration Via  alternative means Compensations -- Postural Changes --   CHL IP OTHER RECOMMENDATIONS 06/08/2016 Recommended Consults -- Oral Care Recommendations Oral care QID Other Recommendations --   CHL IP FOLLOW UP RECOMMENDATIONS 06/08/2016 Follow up Recommendations Inpatient Rehab   CHL IP FREQUENCY AND DURATION 06/08/2016 Speech Therapy Frequency (ACUTE ONLY) min 2x/week Treatment Duration 2 weeks      CHL IP ORAL PHASE 06/08/2016 Oral Phase WFL Oral - Pudding Teaspoon -- Oral - Pudding Cup -- Oral - Honey Teaspoon -- Oral - Honey Cup -- Oral - Nectar Teaspoon -- Oral - Nectar Cup -- Oral - Nectar Straw -- Oral - Thin Teaspoon -- Oral - Thin Cup -- Oral - Thin Straw -- Oral - Puree -- Oral - Mech Soft -- Oral - Regular -- Oral - Multi-Consistency -- Oral - Pill -- Oral Phase - Comment --  CHL IP PHARYNGEAL PHASE 06/08/2016 Pharyngeal Phase Impaired Pharyngeal- Pudding Teaspoon -- Pharyngeal -- Pharyngeal- Pudding Cup -- Pharyngeal -- Pharyngeal- Honey Teaspoon Reduced airway/laryngeal closure;Reduced laryngeal elevation;Reduced epiglottic inversion;Penetration/Apiration after swallow;Pharyngeal residue - valleculae;Pharyngeal residue - cp segment;Moderate aspiration Pharyngeal Material enters airway, passes BELOW cords and not ejected out despite cough attempt by patient Pharyngeal- Honey Cup -- Pharyngeal -- Pharyngeal- Nectar Teaspoon -- Pharyngeal -- Pharyngeal- Nectar Cup -- Pharyngeal -- Pharyngeal- Nectar Straw -- Pharyngeal -- Pharyngeal- Thin Teaspoon Delayed swallow initiation-pyriform sinuses;Reduced laryngeal elevation;Reduced airway/laryngeal closure;Penetration/Aspiration during swallow;Penetration/Apiration after swallow;Pharyngeal residue - cp segment;Pharyngeal residue - valleculae;Significant aspiration (Amount) Pharyngeal Material enters airway, passes BELOW cords without attempt by patient to eject out (silent aspiration) Pharyngeal- Thin Cup -- Pharyngeal -- Pharyngeal- Thin Straw -- Pharyngeal -- Pharyngeal-  Puree Reduced epiglottic inversion;Reduced laryngeal elevation;Moderate aspiration;Penetration/Apiration after swallow;Pharyngeal residue - valleculae;Pharyngeal residue - cp segment;Reduced airway/laryngeal closure Pharyngeal Material enters airway, passes BELOW cords without attempt by patient to eject out (silent aspiration) Pharyngeal- Mechanical Soft -- Pharyngeal -- Pharyngeal- Regular -- Pharyngeal -- Pharyngeal- Multi-consistency -- Pharyngeal -- Pharyngeal- Pill -- Pharyngeal -- Pharyngeal Comment --  CHL IP CERVICAL ESOPHAGEAL PHASE 06/08/2016 Cervical Esophageal Phase Impaired Pudding Teaspoon -- Pudding Cup -- Honey Teaspoon Reduced cricopharyngeal relaxation Honey Cup -- Nectar Teaspoon -- Nectar Cup -- Nectar Straw -- Thin Teaspoon Reduced cricopharyngeal relaxation Thin Cup -- Thin Straw -- Puree Reduced cricopharyngeal relaxation Mechanical Soft -- Regular -- Multi-consistency -- Pill -- Cervical Esophageal Comment appears 2/2 reduced laryngeal elevation CHL IP GO 06/04/2016 Functional Assessment Tool Used NOMS Functional Limitations Swallowing Swallow Current Status (Z6109) CM Swallow Goal Status (U0454) CL Swallow Discharge Status (U9811) (None) Motor Speech Current Status (B1478) (None)  Motor Speech Goal Status [phone]) (None) Motor Speech Goal Status [phone]) (None) Spoken Language Comprehension Current Status [phone]) (None) Spoken Language Comprehension Goal Status (B1478) (None) Spoken Language Comprehension Discharge Status [phone]) (None) Spoken Language Expression Current Status [phone]) (None) Spoken Language Expression Goal Status [phone]) (None) Spoken Language Expression Discharge Status [phone]) (None) Attention Current Status (M8413) (None) Attention Goal Status (K4401) (None) Attention Discharge Status [phone]) (None) Memory Current Status (D6644) (None) Memory Goal Status (I3474) (None) Memory Discharge Status (Q5956) (None) Voice Current Status (L8756) (None) Voice Goal Status (E3329) (None)  Voice Discharge Status (J1884) (None) Other Speech-Language Pathology Functional Limitation [phone]) (None) Other Speech-Language Pathology Functional Limitation Goal Status (T0160) (None) Other Speech-Language Pathology Functional Limitation Discharge Status [phone]) (None) Arlana Lindau 06/08/2016, 2:37 PM Rondel Baton, MS CF-SLP Speech-Language Pathologist [phone]              Cardiac Studies   Cardiac Cath: 05/07/16   LV end diastolic pressure is severely elevated.  Ost LAD to Prox LAD lesion, 30 %stenosed.  Ost 2nd Diag to 2nd Diag lesion, 50 %stenosed.  Ost 1st Diag to 1st Diag lesion, 70 %stenosed.  Ost Cx to Prox Cx lesion, 50 %stenosed.  Ost 1st Mrg to 1st Mrg lesion, 35 %stenosed.  2nd Mrg lesion, 90 %stenosed.  A STENT PROMUS PREM MR 2.5X24 drug eluting stent was successfully placed.  Post intervention, there is a 0% residual stenosis.  Prox RCA to Mid RCA lesion, 50 %stenosed.  RPDA lesion, 100 %stenosed.  1. Diffuse coronary artery disease with relatively small diabetic vessels.  2. Severe segmental stenosis in the second OM. Lesion is hazy and ulcerative. This is the culprit lesion 3. Severely elevated LVEDP 4. Successful stenting of the second OM with DES.  Plan: DAPT for one year. Aggressive risk factor modification. Will treat with IV lasix for elevated LVEDP. Add ARB, beta blocker as tolerated. Assess LV function with Echo. Trend serial troponins and ECG.      Cath 05/24/16  Ost Cx to Mid Cx lesion, 70 %stenosed leading into OM2 as the main trunk of the Circumflex.  Ost 2nd Mrg to 2nd Mrg recent Promus DES 2.5 x 24 stent, - focal 90 %stenosed with what appears to be proximal edge dissection with thrombus  A STENT PROMUS PREM MR 2.5X20 drug eluting stent was successfully placed from Ostium of Circumflex into OM2, and overlaps previously placed stent.  Post intervention, there is a 0% residual  stenosis.  ____________________________________________________  Suezanne Jacquet LAD to Prox LAD lesion, 30 %stenosed.  Ost 1st Diag to 1st Diag lesion, 70 %stenosed.  Ost 1st Mrg to 1st Mrg lesion, 35 %stenosed. Ost 2nd Diag to 2nd Diag lesion, 50 %stenosed.  Prox RCA to Mid RCA lesion, 65 %stenosed - lesion appears similar to prior cath, with mild progression.  Very distal RPDA lesion, 100 %stenosed.  LV end diastolic pressure is severely elevated.  There is no aortic valve stenosis.   Status post what is most likely VF or VT arrest 2 weeks post STEMI with PCI to the circumflex.  His EKG itself did not show signs of ST elevation, however given his recent STEMI, known cardiac myopathy and presentation with cardiac arrest, I felt it prudent taken to the Cath Lab.  Angiographically there appeared to be a possible proximal edge dissection with thrombus in this lesion, however there is TIMI 2 flow. This lesion was treated with an overlapping proximal stent up to the ostium of the circumflex and OM 2. Angiographically  there was good result. It is quite possible at least 1 and strut is into the left main, but non flow-limiting.  Plan:  He'll be admitted to the CCU on hypothermia protocol per PCCM  Continue amiodarone overnight as he appears to have converted into sinus rhythm  Hold ARB and Imdur for now. We'll try to start beta blocker.  Cardiology will follow along while on cooling protocol.  ECHO 05/15/16 - Left ventricle: The cavity size was moderately dilated. Wall thickness was normal. Systolic function was severely reduced. The estimated ejection fraction was in the range of 25% to 30%. Akinesis and scarring of the anteroseptal and anterior myocardium; consistent with infarction in the distribution of the left anterior descending coronary artery. Dyskinesis of the apicalanterior myocardium. Features are consistent with a pseudonormal left ventricular filling pattern,  with concomitant abnormal relaxation and increased filling pressure (grade 2 diastolic dysfunction). No evidence of thrombus. - Left atrium: The atrium was mildly dilated. - Atrial septum: No defect or patent foramen ovale was identified.    Patient Profile     51 y.o. male very early onset coronary artery disease and severe ischemic cardiomyopathy, presenting with quickly resuscitated VT/VF cardiac arrest and requiring repeat PCI to left circumflex roughly 2 weeks after initial presentation with STEMI.  CPR was started within 1 minute of loss of consciousness. He received hypothermia protocol. He appears to have fairly severe neurological sequelae, but is improving on a daily basis.  Assessment & Plan    1. Cardiac arrest:  -S/P STEMI with DES to OM2 05/07/2016 -on 05/24/16 Found by family pulseless and CPR inititated wothin about a minute. CPR for about 10 minutes. AED shock in the field. Cardiac arrest was thought to VT/VF. Pt had recent STEMI and dilated CM. He was taken back tot he cath lab and found to have possible edge dissection with thrombus in the circ, treated with overlapping DES. He was placed on cooling protocol.  -No ekg STEMI, Troponin mas 0.48 -It is felt that the arrest was more related to VT/VF than to subsequent MI and a Life Vest has been planned. The patient is a candidate for defibrillator implant without waiting the 90 days post event, however, with his neurologic status, he is not yet ready to undergo defibrillator placement.  -He has had neurologic deficits, now making progress. Still not able to swallow safely. Is working with PT/ST/OT. Tube feedings in place -No NSVT seen on monitor, No anginal symptoms  2. Anoxic encephalopathy/ injury:  -Hard to say to what degree his deficits are due to ischemic stunning versus permanent. CPR was started very quickly following his cardiac arrest and he underwent hypothermia protocol. Together with his youth, hopefully this  portends a better than average prognosis. He will require intensive rehabilitation with physical therapy/occupational therapy/speech therapy.  -MRI showed bilateral cortical infarcts.  -Neuro status improving. Pt is talking, interactive, knows his family and friends. Moving arms and legs, but unable to walk yet. Still unable to swallow safely. Temporary tube feeding in place. Will monitor progress and may need PEG in the future. Continue PT/OT/ST. -Hospitalist consulted yesterday to assist with management of neurologic status and management of nutrition.  -Carotids checked per IM and found occluded right ICA, has history of right CEA. Vascular surgery was consulted and felt that this occlusion was not acute and no intervention at this time.  -Has myoclonus twitching that has lessened.  -Plan for SNF placement unless he makes considerable progress and may then qualify for  inpatient rehab.  3. CAD:  -Early onset with myocardial infarction at age 28 (1986), managed medically due to collateral formation; severe ischemic cardiomyopathy due to diffuse coronary disease, then STEMI of left circumflex coronary artery in January 2018, with recurrent revascularization following the cardiac arrest. Would keep on lifelong dual antiplatelet therapy  4. CHF, systolic:  -EF 25-30% per echo on 05/15/2016 -Volume assessment is a little challenging. He is lying completely flat in bed without any respiratory difficulty.  -Diuretic has been restarted.    5. Hypernatremia:  -Na  143 today -Increased free water via tube feeding, Na improving. Unfortunately he cannot self regulate the intake of free water until he passes the swallow study.  6. Urinary retention:  -He had 1 L of bladder content yesterday and a Foley catheter was placed. May need "bladder training".    Signed, Berton Bon, NP  06/10/2016, 8:41 AM    Attending Note:   The patient was seen and examined.  Agree with assessment and plan as noted  above.  Changes made to the above note as needed.  Patient seen and independently examined with Hazle Coca.   We discussed all aspects of the encounter. I agree with the assessment and plan as stated above.  1. Cardiac arrest:   Slow progress Getting PT .   2. CAD - no angina  3. Chronic systolic CHF:   Getting coreg 1.61 BID , lisinopril 5 mg a day , Demadex 20 mg a day    I have spent a total of 40 minutes with patient reviewing hospital  notes , telemetry, EKGs, labs and examining patient as well as establishing an assessment and plan that was discussed with the patient. > 50% of time was spent in direct patient care.    Vesta Mixer, Montez Hageman., MD, [facility] [facility] 06/10/2016, 9:53 AM [address],  [address] Office [phone] Pager [phone]

## 2016-06-10 NOTE — Telephone Encounter (Signed)
Sched appt 12/12/16; lab at 11:00 and MD at 11:45. Mailed appt letter, new pt form, and directions to inform pt of appt.

## 2016-06-10 NOTE — Progress Notes (Signed)
Patient ID: Douglas Carr, male   DOB: [birth year], 51 y.o.   MRN: [id number]  PROGRESS NOTE    Douglas Carr  WUJ:[id number] DOB: 1966-01-17 DOA: 05/24/2016  PCP: [name], MD   Brief Narrative:  51 y.o. male with a past medical history significant for CAD/ST EMI on 05/07/2016, cardiac arrest secondary to V. fib on 05/24/2016, chronic combined systolic and diastolic congestive heart failure, DM, HLD, HTN, PVD. Patient has an extensive cardiac history including the first MI at age 44. Patient was admitted to [facility] on 05/24/2016 after V. fib leading to cardiac arrest. Patient was down for an unknown period of time prior to being brought to the emergency room. He was taken emergently to the cath lab for evaluation of recent stent placement during previous admission. Since admission patient has had a very difficult and slow recovery.  Cardio consulted [facility] for nutrition and physical deconditioning.  Seen by PT and recommendation is for CIR.   Assessment & Plan:   Principal Problem:   Cardiac arrest with ventricular fibrillation (HCC) - Admitted on 05/24/16 after cardiac arrest. Complicated cardiac history including MI at age 58, combined diastolic and systolic CHF w/ EF 25% and Grade 2 dysfunction, STEMI w/ cath and stent placement on 05/07/16.  - Currently w/ LifeVest and awaiting permanent ICD.   Active Problems:   Acute metabolic encephalopathy - Likely anoxic brain injury from cardiac arrest resulting in left thalamic stroke - Slow improvement - Per PT - CIR recommended, per rehab MD recommended SNF rehab  - No focal deficits other than dysphagia and he has nasogastric tube for feeding - Per neurology there was a concern for seizure after cardiac arrest so pt placed on Keppra - Has nasogastric tube for feeing; will consult IR to see if PEG can be placed instead as SNF will not take pt with NG tube     Acute respiratory failure with hypoxia - Intubate due to  cardiac arrest - Extubated 1/31 - Stable resp status since     MSSA on trach aspirate - Was on vanco and zosyn - Continue ancef through 2/7    Right CEA - S/p cardiac arrest - Carotid doppler 2/5 -  acute thrombus in the right proximal CCA, significant plaque versus thrombus at the proximal ICA and ECA - Seen by vascular surgery 2/5 - s/p cardiac arrest and found to have occluded his CEA site without significant sequela. Per vascular surgery he would not benefit from intervention at this time. Will need f/u in their office for left sided stenosis on duplex and get that scheduled for 6 months. Otherwise medical management unless his course changes.     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   Objective: Vitals:   06/10/16 0917 06/10/16 1400 06/10/16 1647 06/10/16 1703  BP: (!) 118/54 117/64 (!) 101/57 (!) 111/53  Pulse: 84 87  78  Resp:  20    Temp:  98.6 F (37 C) 98.2 F (36.8 C)   TempSrc:  Oral Axillary   SpO2:  100% 97%   Weight:      Height:        Intake/Output Summary (Last 24 hours) at 06/10/16 1720 Last data filed at 06/10/16 1431  Gross per 24 hour  Intake              2679 ml  Output             1500 ml  Net             1179 ml   Filed Weights   06/08/16 0356 06/09/16 0440 06/10/16 0447  Weight: 94.3 kg (208 lb) 93.4 kg (206 lb) 98.3 kg (216 lb 11.2 oz)    Examination:  General exam: Appears calm and comfortable  Respiratory system: Clear to auscultation. Respiratory effort normal. Cardiovascular system: S1 & S2 heard, Rate controlled; has SEM 2/6   Gastrointestinal system: Has nasogastric tube for feedings, appreciate bowel sounds  Central nervous system: Alert and oriented. No focal neurological deficits. Extremities: Symmetric 5 x 5 power. Skin: No rashes, lesions or ulcers Psychiatry: Judgement and insight appear normal. Mood & affect appropriate.   Data Reviewed: I have personally reviewed following labs and imaging studies  CBC:  Recent Labs Lab 06/05/16 0216 06/06/16 0544 06/10/16 0423  WBC 9.3 10.5 9.3  HGB 11.3* 11.9* 11.0*  HCT 36.3* 38.0* 36.2*  MCV 88.8 88.0 87.7  PLT 178 226 300   Basic Metabolic Panel:  Recent Labs Lab 06/04/16 0458 06/05/16 0216 06/06/16 0544 06/08/16 0846 06/10/16 0423  NA 141 149* 150* 146* 143  K 3.6 3.4* 3.7 3.5 4.1  CL 106 113* 114* 109 109  CO2 24 27 24 25 23   GLUCOSE 135* 136* 209* 216* 284*  BUN 11 23* 21* 12 18  CREATININE 0.74 1.00 0.97 0.80 0.96  CALCIUM 8.3* 8.2* 8.3* 7.8* 8.0*  MG  --  2.4 2.4  --   --   PHOS  --  2.7 2.8  --   --    GFR: Estimated Creatinine Clearance: 107.6 mL/min (by C-G formula based on SCr of 0.96 mg/dL). Liver Function Tests: No results for input(s): AST, ALT, ALKPHOS, BILITOT, PROT, ALBUMIN in the last 168 hours. No results for input(s): LIPASE, AMYLASE in the last 168 hours. No results for input(s): AMMONIA in the last 168 hours. Coagulation Profile: No results for input(s): INR, PROTIME in the last 168 hours. Cardiac Enzymes: No results for input(s): CKTOTAL, CKMB, CKMBINDEX, TROPONINI in the last 168 hours. BNP (last 3 results) No results for  input(s): PROBNP in the last 8760 hours. HbA1C: No results for input(s): HGBA1C in the last 72 hours. CBG:  Recent Labs Lab 06/10/16 0005 06/10/16 0451 06/10/16 0750 06/10/16 1141 06/10/16 1621  GLUCAP 234* 263* 245* 269* 216*   Lipid Profile:  Recent Labs  06/08/16 1434  TRIG 184*   Thyroid Function Tests: No results for input(s): TSH, T4TOTAL, FREET4, T3FREE, THYROIDAB in the last 72 hours. Anemia Panel: No results for input(s): VITAMINB12, FOLATE, FERRITIN, TIBC, IRON, RETICCTPCT in the last 72 hours. Urine analysis:    Component  Value Date/Time   COLORURINE YELLOW 06/01/2016 1715   APPEARANCEUR HAZY (A) 06/01/2016 1715   LABSPEC 1.023 06/01/2016 1715   PHURINE 5.0 06/01/2016 1715   GLUCOSEU NEGATIVE 06/01/2016 1715   HGBUR LARGE (A) 06/01/2016 1715   BILIRUBINUR NEGATIVE 06/01/2016 1715   KETONESUR NEGATIVE 06/01/2016 1715   PROTEINUR 30 (A) 06/01/2016 1715   UROBILINOGEN 0.2 08/19/2011 2051   NITRITE NEGATIVE 06/01/2016 1715   LEUKOCYTESUR MODERATE (A) 06/01/2016 1715   Sepsis Labs: @LABRCNTIP (procalcitonin:4,lacticidven:4)   ) Recent Results (from the past 240 hour(s))  Culture, respiratory (NON-Expectorated)     Status: None   Collection Time: 06/01/16  3:45 AM  Result Value Ref Range Status   Specimen Description TRACHEAL ASPIRATE  Final   Special Requests NONE  Final   Gram Stain   Final    MODERATE WBC PRESENT,BOTH PMN AND MONONUCLEAR ABUNDANT GRAM POSITIVE COCCI FEW GRAM NEGATIVE RODS    Culture ABUNDANT STAPHYLOCOCCUS AUREUS  Final   Report Status 06/03/2016 FINAL  Final   Organism ID, Bacteria STAPHYLOCOCCUS AUREUS  Final      Susceptibility   Staphylococcus aureus - MIC*    CIPROFLOXACIN <=0.5 SENSITIVE Sensitive     ERYTHROMYCIN <=0.25 SENSITIVE Sensitive     GENTAMICIN <=0.5 SENSITIVE Sensitive     OXACILLIN <=0.25 SENSITIVE Sensitive     TETRACYCLINE <=1 SENSITIVE Sensitive     VANCOMYCIN <=0.5 SENSITIVE Sensitive     TRIMETH/SULFA  <=10 SENSITIVE Sensitive     CLINDAMYCIN <=0.25 SENSITIVE Sensitive     RIFAMPIN <=0.5 SENSITIVE Sensitive     Inducible Clindamycin NEGATIVE Sensitive     * ABUNDANT STAPHYLOCOCCUS AUREUS      Radiology Studies: Ct Angio Head W Or Wo Contrast  Result Date: 06/09/2016 CLINICAL DATA:  Stroke. Cardiac arrest on January 20th. Anoxic brain injury. EXAM: CT ANGIOGRAPHY HEAD TECHNIQUE: Multidetector CT imaging of the head was performed using the standard protocol during bolus administration of intravenous contrast. Multiplanar CT image reconstructions and MIPs were obtained to evaluate the vascular anatomy. CONTRAST:  50 mL Isovue 370 IV COMPARISON:  Brain MRI 06/02/2016, 05/28/2016 FINDINGS: CT HEAD Brain: No mass lesion, intraparenchymal hemorrhage or extra-axial collection. No evidence of acute cortical infarct. Faint hypoattenuation at the left thalamus and cerebral peduncle at the site of recently demonstrated acute infarct. No other focal parenchymal abnormality. Vascular: No hyperdense vessel or unexpected calcification. Skull: Normal visualized skull base, calvarium and extracranial soft tissues. Sinuses/Orbits: No sinus fluid levels or advanced mucosal thickening. No mastoid effusion. Normal orbits. CTA HEAD Anterior circulation: --Intracranial internal carotid arteries: The visualized portion of the right internal carotid artery, beginning just inferior to the skull base, is occluded. The left ICA is normal. --Anterior cerebral arteries: Normal. --Middle cerebral arteries: Normal. --Posterior communicating arteries: Absent bilaterally. Posterior circulation: --Posterior cerebral arteries: Normal. --Superior cerebellar arteries: Normal. --Basilar artery: Normal. --Anterior inferior cerebellar arteries: Not clearly visualized, which is not uncommon. --Posterior inferior cerebellar arteries: Normal. Venous sinuses: As permitted by contrast timing, patent. Anatomic variants: None. Delayed phase: No  abnormal parenchymal enhancement. IMPRESSION: 1. 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 The occlusion is favored to be subacute, given the retrospectively asymmetric appearance of the internal carotid flow voids on the prior MRI studies dating back to 05/24/2016. CTA of the neck could be performed to determine the most proximal extent of the occlusion. 2. Otherwise  normal CTA of the brain. 3. Faint hypoattenuation at the site of previously demonstrated small left thalamic infarct. These results will be called to the ordering clinician or representative by the Radiologist Assistant, and communication documented in the PACS or zVision Dashboard. Electronically Signed   By: Deatra Robinson M.D.   On: 06/09/2016 13:46   Dg Abd Portable 1v  Result Date: 06/09/2016 CLINICAL DATA:  Feeding tube placement. EXAM: PORTABLE ABDOMEN - 1 VIEW COMPARISON:  None. FINDINGS: Feeding tube tip is in the third portion of the duodenum in excellent position. Bowel gas pattern is normal. No acute bone abnormality. Contrast in the renal collecting systems from recent CT scan. IMPRESSION: Feeding tube tip in the third portion of the duodenum in good position. Electronically Signed   By: Francene Boyers M.D.   On: 06/09/2016 16:05   Dg Abd Portable 1v  Result Date: 06/06/2016 CLINICAL DATA:  Nasogastric tube placement.  Initial encounter. EXAM: PORTABLE ABDOMEN - 1 VIEW COMPARISON:  Abdominal radiograph performed 06/05/2016 FINDINGS: The patient's enteric tube is seen ending about the proximal jejunum. The visualized bowel gas pattern is grossly unremarkable. No free intra-abdominal air is seen, though evaluation for free air is limited on a single supine view. No acute osseous abnormalities are identified. IMPRESSION: Enteric tube noted ending about the proximal jejunum.  Electronically Signed   By: Roanna Raider M.D.   On: 06/06/2016 23:57   Dg Swallowing Func-speech Pathology  Result Date: 06/08/2016 Objective Swallowing Evaluation: Type of Study: MBS-Modified Barium Swallow Study Patient Details Name: JOVE BEYL MRN: [id number] Date of Birth: 04-12-1966 Today's Date: 06/08/2016 Time: SLP Start Time (ACUTE ONLY): 1214-SLP Stop Time (ACUTE ONLY): 1231 SLP Time Calculation (min) (ACUTE ONLY): 17 min Past Medical History: Past Medical History: Diagnosis Date . Acute ST elevation myocardial infarction (STEMI) involving left circumflex coronary artery (HCC) 05/07/2016  PCI to Cx-OM . Anginal pain (HCC)   secondary to sm. vessel disease . Anxiety  . Arthritis   BIL KNEE PAIN AND BIL ANKLE PAIN . Bone spur of ankle  . Cardiac arrest (HCC) 05/24/2016  with v fib . 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 Coronary artery disease involving native coronary artery of native heart with angina pectoris (HCC) 05/24/2016  Remote MI at 51 years of age, Last cath 2010-diffuse non-obstructive disease; last echo 06/16/08 -normal LV function, moderate concentric hypertrophy; nuc 08/2008 no ischemia;  medical therapy; STEMI May 07 2016 - PCI to Cx-OM . Diabetes mellitus   ON ORAL MEDICATION AND INSULIN . Dilated cardiomyopathy (HCC) 05/24/2016  EF 325-30% by Echo post STEMI (previously 30-35%)  . Hyperlipidemia  . Hypertension  . Myocardial infarction 1996  Post MI . Peripheral vascular disease (HCC)   HAS LEFT CAROTID ARTERY STENOSIS   AND IS S/P RIGHT CAROTID ENDARTERECTOMY 2010 Last carotid dopplers 01/08/2012 wth patent endarterectomy site . Status post coronary artery stent placement  Past Surgical History: Past Surgical History: Procedure Laterality Date . APPENDECTOMY   . CARDIAC CATHETERIZATION    FEB 2010, significant branch vessel disease wth diag, marginal, PDA & PLA, nml. LV function . CARDIAC CATHETERIZATION N/A 05/07/2016  Procedure:  Left Heart Cath and Coronary Angiography;  Surgeon: Peter M Swaziland, MD;  Location[facility] INVASIVE CV LAB;  Service: Cardiovascular;  Laterality: N/A; . CARDIAC CATHETERIZATION N/A 05/07/2016  Procedure: Coronary Stent Intervention;  Surgeon: Peter M Swaziland, MD;  Location: [facility] INVASIVE CV LAB;  Service: Cardiovascular;  Laterality:  N/A; . CARDIAC CATHETERIZATION N/A 05/24/2016  Procedure: Left Heart Cath and Coronary Angiography;  Surgeon: Marykay Lexavid W Harding, MD;  [facility];  Service: Cardiovascular;  Laterality: N/A; . CARDIAC CATHETERIZATION N/A 05/24/2016  Procedure: Coronary Stent Intervention;  Surgeon: Marykay Lexavid W Harding, MD;  [facility];  Service: Cardiovascular;  Laterality: N/A;  2.5x20 Promus to Ostial/proximal circumflex . CAROTID ENDARTERECTOMY  09/2008  Rt CEA . LUNG BIOPSY  2013  Bx's suggest granulomatous dz. . RT ANKLE   2013 HPI: [name] is a 51 yo male with PMH of CAD(recently admitted from 1/3-1/7 for MI, had PCI of Om2), HFrEF (EF 35%), DMII (on insulin), HLD and carotid disease. He was discharged home from that admission, but returned shortly there after with 8lb weight gain, worsening dyspnea and decreased UOP despite full compliance with his outpatient diuretic. His BNP was 610 and weight was increased from 239-246lbs. Admitted with severe ischemic cardiomyopathy, presenting with quickly resuscitated VT/VF cardiac arrest and requiring repeat PCI to left circumflex roughly 2 weeks after initial presentation with STEMI. Anoxic brain injury: MRI 06/02/16 showed Stable left thalamus 4 mm acute infarction. Stable few scattered chronic cortical infarcts over the convexities bilaterally. Subjective: Alert, eager to eat Assessment / Plan / Recommendation CHL IP CLINICAL IMPRESSIONS 06/08/2016 Therapy Diagnosis Severe pharyngeal phase dysphagia Clinical Impression Patient presents with severe pharyngeal dysphagia with significant silent aspiration of thin liquids (~1/2 tsp),  moderate aspiration of honey-thick liquids with cough response, and moderate silent aspiration of pureed. Oral stage of swallowing appeared within functional limits. Swallow initation delayed to the level of the pyriform sinuses with thin liquids. Hyolaryngeal elevation significantly reduced, leading to incomplete epiglottic deflection, reduced amplitude and duration of UES opening, obstruction of bolus flow which spills into the laryngeal vestibule and is silently aspirated during the swallow. Moderate amount of residue remains in the valleculae/ post-cricoid segment with all consistencies trialed (thin, honey, puree), leading to penetration and ultimately aspiration after the swallow. Chin tuck was trialed x1 with thin liquids but is ineffective in preventing aspiration. Based on the above deficits, diet initiation is not advised at this time. Patient may benefit from exercises to address physiologic impairments as well as training in compensatory techniques to improve swallow function. Recommend patient remain NPO; will likely need temporary source of alternative nutrition. SLP will continue to follow.  Impact on safety and function Severe aspiration risk   CHL IP TREATMENT RECOMMENDATION 06/08/2016 Treatment Recommendations Therapy as outlined in treatment plan below   Prognosis 06/08/2016 Prognosis for Safe Diet Advancement Fair Barriers to Reach Goals Cognitive deficits;Severity of deficits Barriers/Prognosis Comment -- CHL IP DIET RECOMMENDATION 06/08/2016 SLP Diet Recommendations NPO;Alternative means - temporary Liquid Administration via -- Medication Administration Via alternative means Compensations -- Postural Changes --   CHL IP OTHER RECOMMENDATIONS 06/08/2016 Recommended Consults -- Oral Care Recommendations Oral care QID Other Recommendations --   CHL IP FOLLOW UP RECOMMENDATIONS 06/08/2016 Follow up Recommendations Inpatient Rehab   CHL IP FREQUENCY AND DURATION 06/08/2016 Speech Therapy Frequency (ACUTE ONLY)  min 2x/week Treatment Duration 2 weeks      CHL IP ORAL PHASE 06/08/2016 Oral Phase WFL Oral - Pudding Teaspoon -- Oral - Pudding Cup -- Oral - Honey Teaspoon -- Oral - Honey Cup -- Oral - Nectar Teaspoon -- Oral - Nectar Cup -- Oral - Nectar Straw -- Oral - Thin Teaspoon -- Oral - Thin Cup -- Oral - Thin Straw -- Oral - Puree -- Oral - Mech  Soft -- Oral - Regular -- Oral - Multi-Consistency -- Oral - Pill -- Oral Phase - Comment --  CHL IP PHARYNGEAL PHASE 06/08/2016 Pharyngeal Phase Impaired Pharyngeal- Pudding Teaspoon -- Pharyngeal -- Pharyngeal- Pudding Cup -- Pharyngeal -- Pharyngeal- Honey Teaspoon Reduced airway/laryngeal closure;Reduced laryngeal elevation;Reduced epiglottic inversion;Penetration/Apiration after swallow;Pharyngeal residue - valleculae;Pharyngeal residue - cp segment;Moderate aspiration Pharyngeal Material enters airway, passes BELOW cords and not ejected out despite cough attempt by patient Pharyngeal- Honey Cup -- Pharyngeal -- Pharyngeal- Nectar Teaspoon -- Pharyngeal -- Pharyngeal- Nectar Cup -- Pharyngeal -- Pharyngeal- Nectar Straw -- Pharyngeal -- Pharyngeal- Thin Teaspoon Delayed swallow initiation-pyriform sinuses;Reduced laryngeal elevation;Reduced airway/laryngeal closure;Penetration/Aspiration during swallow;Penetration/Apiration after swallow;Pharyngeal residue - cp segment;Pharyngeal residue - valleculae;Significant aspiration (Amount) Pharyngeal Material enters airway, passes BELOW cords without attempt by patient to eject out (silent aspiration) Pharyngeal- Thin Cup -- Pharyngeal -- Pharyngeal- Thin Straw -- Pharyngeal -- Pharyngeal- Puree Reduced epiglottic inversion;Reduced laryngeal elevation;Moderate aspiration;Penetration/Apiration after swallow;Pharyngeal residue - valleculae;Pharyngeal residue - cp segment;Reduced airway/laryngeal closure Pharyngeal Material enters airway, passes BELOW cords without attempt by patient to eject out (silent aspiration) Pharyngeal- Mechanical  Soft -- Pharyngeal -- Pharyngeal- Regular -- Pharyngeal -- Pharyngeal- Multi-consistency -- Pharyngeal -- Pharyngeal- Pill -- Pharyngeal -- Pharyngeal Comment --  CHL IP CERVICAL ESOPHAGEAL PHASE 06/08/2016 Cervical Esophageal Phase Impaired Pudding Teaspoon -- Pudding Cup -- Honey Teaspoon Reduced cricopharyngeal relaxation Honey Cup -- Nectar Teaspoon -- Nectar Cup -- Nectar Straw -- Thin Teaspoon Reduced cricopharyngeal relaxation Thin Cup -- Thin Straw -- Puree Reduced cricopharyngeal relaxation Mechanical Soft -- Regular -- Multi-consistency -- Pill -- Cervical Esophageal Comment appears 2/2 reduced laryngeal elevation CHL IP GO 06/04/2016 Functional Assessment Tool Used NOMS Functional Limitations Swallowing Swallow Current Status (W0981) CM Swallow Goal Status (X9147) CL Swallow Discharge Status (W2956) (None) Motor Speech Current Status (O1308) (None) Motor Speech Goal Status (M5784) (None) Motor Speech Goal Status (O9629) (None) Spoken Language Comprehension Current Status (B2841) (None) Spoken Language Comprehension Goal Status (L2440) (None) Spoken Language Comprehension Discharge Status (N0272) (None) Spoken Language Expression Current Status (Z3664) (None) Spoken Language Expression Goal Status (Q0347) (None) Spoken Language Expression Discharge Status (Q2595) (None) Attention Current Status (G3875) (None) Attention Goal Status (I4332) (None) Attention Discharge Status (R5188) (None) Memory Current Status (C1660) (None) Memory Goal Status (Y3016) (None) Memory Discharge Status (W1093) (None) Voice Current Status (A3557) (None) Voice Goal Status (D2202) (None) Voice Discharge Status (R4270) (None) Other Speech-Language Pathology Functional Limitation (W2376) (None) Other Speech-Language Pathology Functional Limitation Goal Status (E8315) (None) Other Speech-Language Pathology Functional Limitation Discharge Status [phone]) (None) Arlana Lindau 06/08/2016, 2:37 PM Rondel Baton, MS CF-SLP Speech-Language  Pathologist [phone]                Scheduled Meds: . aspirin  81 mg Per Tube Daily  . atorvastatin  80 mg Per Tube q1800  . carvedilol  6.25 mg Oral BID WC  . chlorhexidine  15 mL Mouth Rinse BID  . docusate  100 mg Per Tube BID  . famotidine  20 mg Oral Daily  . feeding supplement (PRO-STAT SUGAR FREE 64)  30 mL Per Tube Daily  . free water  200 mL Per Tube QID  . heparin  5,000 Units Subcutaneous Q8H  . insulin aspart  0-15 Units Subcutaneous Q4H  . levETIRAcetam  1,000 mg Intravenous Q12H  . lisinopril  5 mg Oral Daily  . mouth rinse  15 mL Mouth Rinse q12n4p  . multivitamin with minerals  1 tablet Oral Daily  . torsemide  20 mg Oral Daily   Continuous Infusions: . dextrose 5 % and 0.45% NaCl 1,000 mL (06/09/16 1145)  . feeding supplement (OSMOLITE 1.2 CAL) 1,000 mL (06/10/16 1414)     LOS: 17 days    Time spent: 25 minutes  Greater than 50% of the time spent on counseling and coordinating the care.   Manson Passey, MD [facility] Pager [phone]  If 7PM-7AM, please contact night-coverage [email or web address] Password [email or web address] 06/10/2016, 5:20 PM

## 2016-06-10 NOTE — Progress Notes (Signed)
Speech Language Pathology Treatment: Dysphagia  Patient Details Name: Douglas Carr MRN: [id number] DOB: 06/07/1965 Today's Date: 06/10/2016 Time: [id number] SLP Time Calculation (min) (ACUTE ONLY): 19 min  Assessment / Plan / Recommendation Clinical Impression  Pt seen for f/u dysphagia tx with focus on pharyngeal strengthening exercises. Pt performed exercises introduced on previous date with Min cues. Pt performed 10+ swallows per exercise with one delayed cough noted after last ice chip was consumed. His voice remains hoarse, but he and his wife believe that it has mildly improved overall. Recommend to continue with current plan of care.  MD, please consider ordering SLP cognitive-linguistic evaluation.    HPI HPI: [name] is a 51 yo male with PMH of CAD(recently admitted from 1/3-1/7 for MI, had PCI of Om2), HFrEF (EF 35%), DMII (on insulin), HLD and carotid disease. He was discharged home from that admission, but returned shortly there after with 8lb weight gain, worsening dyspnea and decreased UOP despite full compliance with his outpatient diuretic. His BNP was 610 and weight was increased from 239-246lbs. Admitted with severe ischemic cardiomyopathy, presenting with quickly resuscitated VT/VF cardiac arrest and requiring repeat PCI to left circumflex roughly 2 weeks after initial presentation with STEMI. Anoxic brain injury: MRI 06/02/16 showed Stable left thalamus 4 mm acute infarction. Stable few scattered chronic cortical infarcts over the convexities bilaterally.      SLP Plan  Continue with current plan of care     Recommendations  Diet recommendations: NPO;Other(comment) (except ice chips after oral care) Medication Administration: Via alternative means                Oral Care Recommendations: Oral care QID;Oral care prior to ice chip/H20 Follow up Recommendations: Inpatient Rehab Plan: Continue with current plan of care       GO                 [name], [name] 06/10/2016, 3:29 PM  [name], M.A. CCC-SLP [phone]

## 2016-06-10 NOTE — Progress Notes (Signed)
Inpatient Diabetes Program Recommendations  AACE/ADA: New Consensus Statement on Inpatient Glycemic Control (2015)  Target Ranges:  Prepandial:   less than 140 mg/dL      Peak postprandial:   less than 180 mg/dL (1-2 hours)      Critically ill patients:  140 - 180 mg/dL   Lab Results  Component Value Date   GLUCAP 269 (H) 06/10/2016   HGBA1C 9.3 (H) 05/07/2016    Review of Glycemic Control  Blood sugars > goal of 180 mg/dL. Needs basal insulin and TF coverage.  Inpatient Diabetes Program Recommendations:    Add Lantus 15 units QHS Add Novolog 4 units Q4H for TF coverage. (Do not give if TF is d/ced.)  Will continue to follow. Thank you. Ailene Ardshonda Inigo Lantigua, RD, LDN, CDE Inpatient Diabetes Coordinator [phone]

## 2016-06-10 NOTE — Care Management Note (Addendum)
Case Management Note  Patient Details  Name: Douglas Carr MRN: [id number] Date of Birth: 1965/07/29  Subjective/Objective:  Pt presented as a transfer from [facility]N. Pt in for cardiac arrest secondary to v fib. Anoxic Encephalopathy- neuro status is improving. CIR was consulted & at this time will not be able to take due to patient will not have 24 hour supervision once d/c from CIR. Pt was previously at home with wife and wife works.              Action/Plan: CM will continue to monitor. CSW is aware and following for SNF placement.   Expected Discharge Date:                  Expected Discharge Plan:  Skilled Nursing Facility  In-House Referral:  Clinical Social Work, Software engineerChaplain  Discharge planning Services  CM Consult  Post Acute Care Choice:  NA Choice offered to:  NA  DME Arranged:  N/A DME Agency:  NA  HH Arranged:  NA HH Agency:  NA  Status of Service:  Completed, signed off  If discussed at Long Length of Stay Meetings, dates discussed:  06-17-16  Additional Comments: 1118 06-17-16 Tomi BambergerBrenda Graves-Bigelow, RN,BSN [phone] [facility] Kelly to fit with wife at bedside once wife gets here. No further needs from CM at this time.   06-13-16 [address] Tomi BambergerBrenda Graves-Bigelow, [place],BSN [phone] [facility] did call CM in regards to disposition needs. Shon HaleMary Beth Myslo @ [phone]. Plan helps with transition of care if the plan will be home once stable. CM will continue to monitor.    1224 06-12-16 Pt will need to be fit for [facility] prior to d/c to SNF. CSW is following for SNF that will accept [facility]. Unsure if pt will need Cortrak replaced. CM will continue to monitor.  Gala LewandowskyGraves-Bigelow, Doylene Splinter Kaye, RN 06/10/2016, 4:06 PM

## 2016-06-10 NOTE — Telephone Encounter (Signed)
-----   Message from Sharee PimpleMarilyn K McChesney, RN sent at 06/10/2016  9:48 AM EST ----- Regarding: schedule   ----- Message ----- From: Douglas HarmanBrandon Christopher Cain, MD Sent: 06/09/2016   5:33 PM To: [facility] [address]  [place] Orlean BradfordM Cardon [phone] 1965/08/09  Inpatient level 3 consult  Dx: occluded right ica  F/u in 6 months with carotid duplex

## 2016-06-11 ENCOUNTER — Inpatient Hospital Stay (HOSPITAL_COMMUNITY): Payer: BC Managed Care – PPO

## 2016-06-11 LAB — GLUCOSE, CAPILLARY
GLUCOSE-CAPILLARY: 193 mg/dL — AB (ref 65–99)
GLUCOSE-CAPILLARY: 216 mg/dL — AB (ref 65–99)
GLUCOSE-CAPILLARY: 219 mg/dL — AB (ref 65–99)
Glucose-Capillary: 160 mg/dL — ABNORMAL HIGH (ref 65–99)
Glucose-Capillary: 210 mg/dL — ABNORMAL HIGH (ref 65–99)
Glucose-Capillary: 252 mg/dL — ABNORMAL HIGH (ref 65–99)
Glucose-Capillary: 265 mg/dL — ABNORMAL HIGH (ref 65–99)

## 2016-06-11 MED ORDER — INSULIN GLARGINE 100 UNIT/ML ~~LOC~~ SOLN
20.0000 [IU] | Freq: Every day | SUBCUTANEOUS | Status: DC
  Administered 2016-06-11 – 2016-06-16 (×6): 20 [IU] via SUBCUTANEOUS
  Filled 2016-06-11 (×6): qty 0.2

## 2016-06-11 NOTE — NC FL2 (Signed)
[place] MEDICAID FL2 LEVEL OF CARE SCREENING TOOL     IDENTIFICATION  Patient Name: Douglas Carr Birthdate: 04-02-1966 Sex: male Admission Date (Current Location): 05/24/2016  [facility] [place]   Facility and Address:  [facility] [place], [address], [place], [place] [address]      Provider Number: [id number]  Attending Physician Name and Address:  Marykay Lexavid W Harding, MD  Relative Name and Phone Number:       Current Level of Care: Hospital Recommended Level of Care: Skilled Nursing Facility Prior Approval Number:    Date Approved/Denied:   PASRR Number:    Discharge Plan: SNF    Current Diagnoses: Patient Active Problem List   Diagnosis Date Noted  . MSSA (methicillin susceptible Staphylococcus aureus) infection   . Diabetes mellitus with complication (HCC)   . Anoxia   . Diabetes mellitus type 2 in obese (HCC)   . Aspiration pneumonia (HCC)   . Acute on chronic combined systolic and diastolic CHF (congestive heart failure) (HCC)   . Fever   . Hypernatremia   . Leukocytosis   . Acute blood loss anemia   . Myoclonus   . Acute respiratory failure with hypoxia (HCC) 05/30/2016  . Ventilator dependent (HCC)   . Palliative care by specialist   . Encounter for hospice care discussion   . Anoxic brain injury (HCC)   . Anoxic encephalopathy (HCC)   . Seizures (HCC)   . Goals of care, counseling/discussion   . Palliative care encounter   . Central venous catheter in place   . Cardiac arrest (HCC) 05/25/2016  . Acute encephalopathy   . Cardiac arrest with ventricular fibrillation (HCC) 05/24/2016  . Dilated cardiomyopathy (HCC) 05/24/2016  . Coronary artery disease involving native coronary artery of native heart with angina pectoris (HCC) 05/24/2016  . ST elevation myocardial infarction (STEMI) (HCC)   . 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 Status post  coronary artery stent placement   . Acute on chronic systolic heart failure (HCC)   . Acute ST elevation myocardial infarction (STEMI) involving left circumflex coronary artery (HCC) 05/07/2016  . Essential hypertension 09/21/2013  . Diabetes mellitus (HCC) 09/21/2013  . Combined hyperlipidemia associated with type 2 diabetes mellitus 09/21/2013  . Pulmonary nodules 10/16/2011    Orientation RESPIRATION BLADDER Height & Weight     Self  Normal Continent Weight: 205 lb (93 kg) Height:  5\' 9"  (175.3 cm)  BEHAVIORAL SYMPTOMS/MOOD NEUROLOGICAL BOWEL NUTRITION STATUS      Incontinent Diet, NG/panda (NPO)  AMBULATORY STATUS COMMUNICATION OF NEEDS Skin   Extensive Assist Verbally Normal                       Personal Care Assistance Level of Assistance  Bathing, Dressing, Feeding Bathing Assistance: Maximum assistance Feeding assistance: Maximum assistance Dressing Assistance: Maximum assistance     Functional Limitations Info  Sight, Hearing Sight Info: Adequate Hearing Info: Adequate Speech Info: Impaired    SPECIAL CARE FACTORS FREQUENCY  PT (By licensed PT), OT (By licensed OT), Speech therapy     PT Frequency: 5 OT Frequency: 5     Speech Therapy Frequency: 5      Contractures Contractures Info: Not present    Additional Factors Info  Code Status, Allergies, Insulin Sliding Scale Code Status Info: Full Code Allergies Info: No Known Allergies   Insulin Sliding Scale Info:  4x/day       Current Medications (06/11/2016):  This is the current hospital active medication list Current Facility-Administered Medications  Medication Dose Route Frequency Provider Last Rate Last Dose  . acetaminophen (TYLENOL) suppository 650 mg  650 mg Rectal Q6H PRN Zigmund Gottron, MD   650 mg at 05/31/16 1225  . acetaminophen (TYLENOL) tablet 650 mg  650 mg Oral Q4H PRN Marykay Lex, MD   650 mg at 06/03/16 1744  . aspirin chewable tablet 81 mg  81 mg Per Tube Daily Marykay Lex, MD   81 mg at 06/10/16 4782  . atorvastatin (LIPITOR) tablet 80 mg  80 mg Per Tube q1800 Marykay Lex, MD   80 mg at 06/10/16 1703  . carvedilol (COREG) tablet 6.25 mg  6.25 mg Oral BID WC Mihai Croitoru, MD   6.25 mg at 06/10/16 1703  . chlorhexidine (PERIDEX) 0.12 % solution 15 mL  15 mL Mouth Rinse BID Alyson Reedy, MD   15 mL at 06/10/16 2138  . dextrose 5 %-0.45 % sodium chloride infusion   Intravenous Continuous Ozella Rocks, MD 50 mL/hr at 06/10/16 2148    . docusate (COLACE) 50 MG/5ML liquid 100 mg  100 mg Per Tube BID Nelda Bucks, MD   100 mg at 06/10/16 0919  . famotidine (PEPCID) 40 MG/5ML suspension 20 mg  20 mg Oral Daily Marykay Lex, MD   20 mg at 06/10/16 0919  . feeding supplement (OSMOLITE 1.2 CAL) liquid 1,000 mL  1,000 mL Per Tube Continuous Marykay Lex, MD 70 mL/hr at 06/10/16 1414 1,000 mL at 06/10/16 1414  . feeding supplement (PRO-STAT SUGAR FREE 64) liquid 30 mL  30 mL Per Tube Daily Marykay Lex, MD   30 mL at 06/10/16 0919  . free water 200 mL  200 mL Per Tube QID Thurmon Fair, MD   200 mL at 06/10/16 2139  . heparin injection 5,000 Units  5,000 Units Subcutaneous Q8H Marykay Lex, MD   5,000 Units at 06/11/16 0502  . insulin aspart (novoLOG) injection 0-15 Units  0-15 Units Subcutaneous Q4H Arty Baumgartner, NP   5 Units at 06/11/16 0457  . insulin aspart (novoLOG) injection 4 Units  4 Units Subcutaneous TID WC Alison Murray, MD      . insulin glargine (LANTUS) injection 15 Units  15 Units Subcutaneous QHS Alison Murray, MD   15 Units at 06/10/16 2137  . levETIRAcetam (KEPPRA) 1,000 mg in sodium chloride 0.9 % 100 mL IVPB  1,000 mg Intravenous Q12H Marykay Lex, MD   1,000 mg at 06/11/16 1015  . lisinopril (PRINIVIL,ZESTRIL) tablet 5 mg  5 mg Oral Daily Alyson Reedy, MD   5 mg at 06/10/16 9562  . MEDLINE mouth rinse  15 mL Mouth Rinse q12n4p Alyson Reedy, MD   15 mL at 06/11/16 1000  . multivitamin with minerals tablet 1  tablet  1 tablet Oral Daily Marykay Lex, MD   1 tablet at 06/10/16 [phone]  . ondansetron (ZOFRAN) injection 4 mg  4 mg Intravenous Q6H PRN Marykay Lex, MD   4 mg at 05/27/16 0345  . pneumococcal 23 valent vaccine (PNU-IMMUNE) injection 0.5 mL  0.5 mL Intramuscular Prior to discharge Marykay Lex, MD      . torsemide [facility]) tablet 20 mg  20 mg Oral Daily Thurmon Fair, MD   20 mg at 06/10/16 [phone]  Discharge Medications: Please see discharge summary for a list of discharge medications.  Relevant Imaging Results:  Relevant Lab Results:   Additional Information SSN:  [id number]  Dede Query, LCSW

## 2016-06-11 NOTE — Progress Notes (Signed)
Progress Note  Patient Name: Douglas Carr Date of Encounter: 06/11/2016  Primary Cardiologist: Allyson Sabal  Subjective   Pt is alert and frustrated with not being able to eat. He pulled out his feeding tube saying that it was not doing anything and he is starving. I tried to explain that he will choke if he tries to eat and he does not believe me. He is upset that he did not get his 2 hours of chair time yesterday.   Inpatient Medications    Scheduled Meds: . aspirin  81 mg Per Tube Daily  . atorvastatin  80 mg Per Tube q1800  . carvedilol  6.25 mg Oral BID WC  . chlorhexidine  15 mL Mouth Rinse BID  . docusate  100 mg Per Tube BID  . famotidine  20 mg Oral Daily  . feeding supplement (PRO-STAT SUGAR FREE 64)  30 mL Per Tube Daily  . free water  200 mL Per Tube QID  . heparin  5,000 Units Subcutaneous Q8H  . insulin aspart  0-15 Units Subcutaneous Q4H  . insulin aspart  4 Units Subcutaneous TID WC  . insulin glargine  15 Units Subcutaneous QHS  . levETIRAcetam  1,000 mg Intravenous Q12H  . lisinopril  5 mg Oral Daily  . mouth rinse  15 mL Mouth Rinse q12n4p  . multivitamin with minerals  1 tablet Oral Daily  . torsemide  20 mg Oral Daily   Continuous Infusions: . dextrose 5 % and 0.45% NaCl 50 mL/hr at 06/10/16 2148  . feeding supplement (OSMOLITE 1.2 CAL) 1,000 mL (06/10/16 1414)   PRN Meds: acetaminophen, acetaminophen, ondansetron (ZOFRAN) IV, pneumococcal 23 valent vaccine   Vital Signs    Vitals:   06/10/16 1932 06/11/16 0017 06/11/16 0458 06/11/16 0502  BP: (!) 117/50 117/73  (!) 138/55  Pulse: 77 76  71  Resp: 19 13  13   Temp: 98.9 F (37.2 C) 98.5 F (36.9 C)  98.6 F (37 C)  TempSrc: Oral Axillary  Oral  SpO2: 95% 99%  98%  Weight:   205 lb (93 kg)   Height:        Intake/Output Summary (Last 24 hours) at 06/11/16 0759 Last data filed at 06/11/16 7106  Gross per 24 hour  Intake             2906 ml  Output             2450 ml  Net               456 ml   Filed Weights   06/09/16 0440 06/10/16 0447 06/11/16 0458  Weight: 206 lb (93.4 kg) 216 lb 11.2 oz (98.3 kg) 205 lb (93 kg)    Telemetry    NSR with rates in the 80's, no ectopy- Personally Reviewed  ECG    05/25/2016, NSR, extensive old anterior MI, lateral ST-T changes - Personally Reviewed  Physical Exam  Alert, only able to speak in a hoarse voice, very short usually monosyllabic utterances, but these appear to be appropriate and he follows commands GEN: No acute distress.   Neck: No JVD Cardiac: RRR, no murmurs, rubs, or gallops.  Respiratory: Clear to auscultation bilaterally except for scattered rhonchi, cough noted GI: Soft, nontender, non-distended, feeding tube in place via nose MS: No edema; No deformity. Neuro:   muscle weakness and ataxia Psych: Normal affect   Labs    Chemistry  Recent Labs Lab 06/06/16 2694 06/08/16 8546 06/10/16 0423  NA 150* 146* 143  K 3.7 3.5 4.1  CL 114* 109 109  CO2 24 25 23   GLUCOSE 209* 216* 284*  BUN 21* 12 18  CREATININE 0.97 0.80 0.96  CALCIUM 8.3* 7.8* 8.0*  GFRNONAA >60 >60 >60  GFRAA >60 >60 >60  ANIONGAP 12 12 11      Hematology  Recent Labs Lab 06/05/16 0216 06/06/16 0544 06/10/16 0423  WBC 9.3 10.5 9.3  RBC 4.09* 4.32 4.13*  HGB 11.3* 11.9* 11.0*  HCT 36.3* 38.0* 36.2*  MCV 88.8 88.0 87.7  MCH 27.6 27.5 26.6  MCHC 31.1 31.3 30.4  RDW 13.3 12.9 13.2  PLT 178 226 300     Radiology    Ct Angio Head W Or Wo Contrast  Result Date: 06/09/2016 CLINICAL DATA:  Stroke. Cardiac arrest on January 20th. Anoxic brain injury. EXAM: CT ANGIOGRAPHY HEAD TECHNIQUE: Multidetector CT imaging of the head was performed using the standard protocol during bolus administration of intravenous contrast. Multiplanar CT image reconstructions and MIPs were obtained to evaluate the vascular anatomy. CONTRAST:  50 mL Isovue 370 IV COMPARISON:  Brain MRI 06/02/2016, 05/28/2016 FINDINGS: CT HEAD Brain: No mass lesion,  intraparenchymal hemorrhage or extra-axial collection. No evidence of acute cortical infarct. Faint hypoattenuation at the left thalamus and cerebral peduncle at the site of recently demonstrated acute infarct. No other focal parenchymal abnormality. Vascular: No hyperdense vessel or unexpected calcification. Skull: Normal visualized skull base, calvarium and extracranial soft tissues. Sinuses/Orbits: No sinus fluid levels or advanced mucosal thickening. No mastoid effusion. Normal orbits. CTA HEAD Anterior circulation: --Intracranial internal carotid arteries: The visualized portion of the right internal carotid artery, beginning just inferior to the skull base, is occluded. The left ICA is normal. --Anterior cerebral arteries: Normal. --Middle cerebral arteries: Normal. --Posterior communicating arteries: Absent bilaterally. Posterior circulation: --Posterior cerebral arteries: Normal. --Superior cerebellar arteries: Normal. --Basilar artery: Normal. --Anterior inferior cerebellar arteries: Not clearly visualized, which is not uncommon. --Posterior inferior cerebellar arteries: Normal. Venous sinuses: As permitted by contrast timing, patent. Anatomic variants: None. Delayed phase: No abnormal parenchymal enhancement. IMPRESSION: 1. 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 The occlusion is favored to be subacute, given the retrospectively asymmetric appearance of the internal carotid flow voids on the prior MRI studies dating back to 05/24/2016. CTA of the neck could be performed to determine the most proximal extent of the occlusion. 2. Otherwise normal CTA of the brain. 3. Faint hypoattenuation at the site of previously demonstrated small left thalamic infarct. These results will be called to the ordering clinician or representative by the  Radiologist Assistant, and communication documented in the PACS or zVision Dashboard. Electronically Signed   By: Deatra Robinson M.D.   On: 06/09/2016 13:46   Dg Abd Portable 1v  Result Date: 06/09/2016 CLINICAL DATA:  Feeding tube placement. EXAM: PORTABLE ABDOMEN - 1 VIEW COMPARISON:  None. FINDINGS: Feeding tube tip is in the third portion of the duodenum in excellent position. Bowel gas pattern is normal. No acute bone abnormality. Contrast in the renal collecting systems from recent CT scan. IMPRESSION: Feeding tube tip in the third portion of the duodenum in good position. Electronically Signed   By: Francene Boyers M.D.   On: 06/09/2016 16:05    Cardiac Studies   Cardiac Cath: 05/07/16   LV end diastolic pressure is severely elevated.  Ost LAD to  Prox LAD lesion, 30 %stenosed.  Ost 2nd Diag to 2nd Diag lesion, 50 %stenosed.  Ost 1st Diag to 1st Diag lesion, 70 %stenosed.  Ost Cx to Prox Cx lesion, 50 %stenosed.  Ost 1st Mrg to 1st Mrg lesion, 35 %stenosed.  2nd Mrg lesion, 90 %stenosed.  A STENT PROMUS PREM MR 2.5X24 drug eluting stent was successfully placed.  Post intervention, there is a 0% residual stenosis.  Prox RCA to Mid RCA lesion, 50 %stenosed.  RPDA lesion, 100 %stenosed.  1. Diffuse coronary artery disease with relatively small diabetic vessels.  2. Severe segmental stenosis in the second OM. Lesion is hazy and ulcerative. This is the culprit lesion 3. Severely elevated LVEDP 4. Successful stenting of the second OM with DES.  Plan: DAPT for one year. Aggressive risk factor modification. Will treat with IV lasix for elevated LVEDP. Add ARB, beta blocker as tolerated. Assess LV function with Echo. Trend serial troponins and ECG.      Cath 05/24/16  Ost Cx to Mid Cx lesion, 70 %stenosed leading into OM2 as the main trunk of the Circumflex.  Ost 2nd Mrg to 2nd Mrg recent Promus DES 2.5 x 24 stent, - focal 90 %stenosed with what appears to be proximal edge  dissection with thrombus  A STENT PROMUS PREM MR 2.5X20 drug eluting stent was successfully placed from Ostium of Circumflex into OM2, and overlaps previously placed stent.  Post intervention, there is a 0% residual stenosis.  ____________________________________________________  Suezanne Jacquetst LAD to Prox LAD lesion, 30 %stenosed.  Ost 1st Diag to 1st Diag lesion, 70 %stenosed.  Ost 1st Mrg to 1st Mrg lesion, 35 %stenosed. Ost 2nd Diag to 2nd Diag lesion, 50 %stenosed.  Prox RCA to Mid RCA lesion, 65 %stenosed - lesion appears similar to prior cath, with mild progression.  Very distal RPDA lesion, 100 %stenosed.  LV end diastolic pressure is severely elevated.  There is no aortic valve stenosis.   Status post what is most likely VF or VT arrest 2 weeks post STEMI with PCI to the circumflex.  His EKG itself did not show signs of ST elevation, however given his recent STEMI, known cardiac myopathy and presentation with cardiac arrest, I felt it prudent taken to the Cath Lab.  Angiographically there appeared to be a possible proximal edge dissection with thrombus in this lesion, however there is TIMI 2 flow. This lesion was treated with an overlapping proximal stent up to the ostium of the circumflex and OM 2. Angiographically there was good result. It is quite possible at least 1 and strut is into the left main, but non flow-limiting.  Plan:  He'll be admitted to the CCU on hypothermia protocol per PCCM  Continue amiodarone overnight as he appears to have converted into sinus rhythm  Hold ARB and Imdur for now. We'll try to start beta blocker.  Cardiology will follow along while on cooling protocol.  ECHO 05/15/16 - Left ventricle: The cavity size was moderately dilated. Wall thickness was normal. Systolic function was severely reduced. The estimated ejection fraction was in the range of 25% to 30%. Akinesis and scarring of the anteroseptal and anterior myocardium;  consistent with infarction in the distribution of the left anterior descending coronary artery. Dyskinesis of the apicalanterior myocardium. Features are consistent with a pseudonormal left ventricular filling pattern, with concomitant abnormal relaxation and increased filling pressure (grade 2 diastolic dysfunction). No evidence of thrombus. - Left atrium: The atrium was mildly dilated. - Atrial septum: No defect or patent  foramen ovale was identified.    Patient Profile     51 y.o. male very early onset coronary artery disease and severe ischemic cardiomyopathy, presenting with quickly resuscitated VT/VF cardiac arrest and requiring repeat PCI to left circumflex roughly 2 weeks after initial presentation with STEMI.  CPR was started within 1 minute of loss of consciousness. He received hypothermia protocol. He appears to have fairly severe neurological sequelae, but is improving on a daily basis.  Assessment & Plan    1. Cardiac arrest:  -S/P STEMI with DES to OM2 05/07/2016 -on 05/24/16 Found by family pulseless and CPR inititated wothin about a minute. CPR for about 10 minutes. AED shock in the field. Cardiac arrest was thought to VT/VF. Pt had recent STEMI and dilated CM. He was taken back tot he cath lab and found to have possible edge dissection with thrombus in the circ, treated with overlapping DES. He was placed on cooling protocol.  -No ekg STEMI, Troponin mas 0.48 -It is felt that the arrest was more related to VT/VF than to subsequent MI and a Life Vest has been planned. The patient is a candidate for defibrillator implant without waiting the 90 days post event, however, with his neurologic status, he is not yet ready to undergo defibrillator placement.  -He has had neurologic deficits, now making progress. Still not able to swallow safely. Is working with PT/ST/OT. Tube feedings in place -No NSVT seen on monitor, No anginal symptoms  2. Anoxic encephalopathy/ injury:   -Hard to say to what degree his deficits are due to ischemic stunning versus permanent. CPR was started very quickly following his cardiac arrest and he underwent hypothermia protocol. Together with his youth, hopefully this portends a better than average prognosis. He will require intensive rehabilitation with physical therapy/occupational therapy/speech therapy.  -MRI showed bilateral cortical infarcts.  -Neuro status improving. Pt is talking, interactive. Is very frustrated today at not being able to eat. He failed barium swallow test. He pulled out his feeding tube twice. Will discuss with ST. May be able to retest him as he is making significant progress. Continue PT/OT/ST. -Hospitalist consulted to assist with management of neurologic status and management of nutrition.  -Carotids checked per IM and found occluded right ICA, has history of right CEA. Vascular surgery was consulted and felt that this occlusion was not acute and no intervention at this time.  -Has myoclonus twitching that has lessened.  -Plan for SNF placement unless he makes considerable progress and may then qualify for inpatient rehab.  3. CAD:  -Early onset with myocardial infarction at age 70 (1986), managed medically due to collateral formation; severe ischemic cardiomyopathy due to diffuse coronary disease, then STEMI of left circumflex coronary artery in January 2018, with recurrent revascularization following the cardiac arrest. Would keep on lifelong dual antiplatelet therapy  4. CHF, systolic:  -EF 25-30% per echo on 05/15/2016 -Appears euvolemic, Intake was increased with tube feedings and he is up 456 ml for last 24h. Weight appears to be down or at least stable (down 40 pounds this admission) -Diuretic has been restarted. Electrolytes and kidney function stable.   5. Hypernatremia:  -Na  143 today -Increased free water via tube feeding, Na improving. Unfortunately he cannot self regulate the intake of free water  until he passes the swallow study.  6. Urinary retention:  -He had 1 L of bladder content yesterday and a Foley catheter was placed. May need "bladder training".    Signed, Berton Bon, NP  06/11/2016, 7:59 AM   Pager: [phone]  Attending Note:   The patient was seen and examined.  Agree with assessment and plan as noted above.  Changes made to the above note as needed.  Patient seen and independently examined with Judithann Sauger, NP .   We discussed all aspects of the encounter. I agree with the assessment and plan as stated above.   1. Coronary artery disease: The patient has not had any episodes of angina  2. Cardiac arrest: It seems that the most likely cause of this cardiac arrest was ventricular fibrillation/ventricular tachycardia and it does not seem that it was related to the stent edge dissection Eventually I plan is to place a defibrillator. In the meanwhile we will anticipate sending him home with a LifeVest  2. Anoxic brain damage: I appreciate Dr. Edison Simon help with these issues. There is some discussion for a permanent feeding tube. He's been working with speech pathology. He'll also need PT/OT  to consult on a regular ongoing basis.   I have spent a total of 40 minutes with patient reviewing hospital  notes , telemetry, EKGs, labs and examining patient as well as establishing an assessment and plan that was discussed with the patient. > 50% of time was spent in direct patient care.    Vesta Mixer, Montez Hageman., MD, [facility] 06/11/2016, 12:06 PM [address],  [address] Office [phone] Pager [phone]

## 2016-06-11 NOTE — Progress Notes (Signed)
Speech Language Pathology Treatment: Dysphagia  Patient Details Name: Douglas Carr MRN: [id number] DOB: [birth year] Today's Date: 06/11/2016 Time: 1205-     Assessment / Plan / Recommendation Clinical Impression  Pt pulled cortrak overnight; wants to eat but is unable to understand rationale for NPO status.  Requested by RN to reassess swallow despite recent MBS three days ago.  Pt continues with intermitted coughing after consumption of ice chips. Min cues for effortful swallow.  No further POs offered.  Wife present.  Pt unable to recall results of MBS.  Plan to proceed with FEES this afternoon to assist with decision-making re: nutrition pending D/C.    HPI HPI: Douglas Carr is a 51 yo male with PMH of CAD(recently admitted from 1/3-1/7 for MI, had PCI of Om2), HFrEF (EF 35%), DMII (on insulin), HLD and carotid disease. He was discharged home from that admission, but returned shortly there after with 8lb weight gain, worsening dyspnea and decreased UOP despite full compliance with his outpatient diuretic. His BNP was 610 and weight was increased from 239-246lbs. Admitted with severe ischemic cardiomyopathy, presenting with quickly resuscitated VT/VF cardiac arrest and requiring repeat PCI to left circumflex roughly 2 weeks after initial presentation with STEMI. Anoxic brain injury: MRI 06/02/16 showed Stable left thalamus 4 mm acute infarction. Stable few scattered chronic cortical infarcts over the convexities bilaterally.     SLP Plan   (FEES)     Recommendations  Diet recommendations: NPO                Plan:  (FEES)       GO                [name], [name] 06/11/2016, 12:28 PM

## 2016-06-11 NOTE — Progress Notes (Signed)
Oral care performed and 1 ice chip was given to Pt. Pt begin to cough after taking ice chip. Tech Occupational hygienistnotified RN.

## 2016-06-11 NOTE — Procedures (Signed)
Objective Swallowing Evaluation: Type of Study: FEES-Fiberoptic Endoscopic Evaluation of Swallow  Patient Details  Name: Douglas Carr MRN: [id number] Date of Birth: [birth year]  Today's Date: 06/11/2016 Time: SLP Start Time (ACUTE ONLY): 1430-SLP Stop Time (ACUTE ONLY): 1545 SLP Time Calculation (min) (ACUTE ONLY): 75 min  Past Medical History:  Past Medical History:  Diagnosis Date  . Acute ST elevation myocardial infarction (STEMI) involving left circumflex coronary artery (HCC) 05/07/2016   PCI to Cx-OM  . Anginal pain (HCC)    secondary to sm. vessel disease  . Anxiety   . Arthritis    BIL KNEE PAIN AND BIL ANKLE PAIN  . Bone spur of ankle   . Cardiac arrest (HCC) 05/24/2016   with v fib  . 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 Coronary artery disease involving native coronary artery of native heart with angina pectoris (HCC) 05/24/2016   Remote MI at 51 years of age, Last cath 2010-diffuse non-obstructive disease; last echo 06/16/08 -normal LV function, moderate concentric hypertrophy; nuc 08/2008 no ischemia;  medical therapy; STEMI May 07 2016 - PCI to Cx-OM  . Diabetes mellitus    ON ORAL MEDICATION AND INSULIN  . Dilated cardiomyopathy (HCC) 05/24/2016   EF 325-30% by Echo post STEMI (previously 30-35%)   . Hyperlipidemia   . Hypertension   . Myocardial infarction 1996   Post MI  . Peripheral vascular disease (HCC)    HAS LEFT CAROTID ARTERY STENOSIS   AND IS S/P RIGHT CAROTID ENDARTERECTOMY 2010 Last carotid dopplers 01/08/2012 wth patent endarterectomy site  . Status post coronary artery stent placement    Past Surgical History:  Past Surgical History:  Procedure Laterality Date  . APPENDECTOMY    . CARDIAC CATHETERIZATION     FEB 2010, significant branch vessel disease wth diag, marginal, PDA & PLA, nml. LV function  . CARDIAC CATHETERIZATION N/A 05/07/2016   Procedure: Left Heart Cath and Coronary  Angiography;  Surgeon: Peter M SwazilandJordan, MD;  [facility];  Service: Cardiovascular;  Laterality: N/A;  . CARDIAC CATHETERIZATION N/A 05/07/2016   Procedure: Coronary Stent Intervention;  Surgeon: Peter M SwazilandJordan, MD;  [facility];  Service: Cardiovascular;  Laterality: N/A;  . CARDIAC CATHETERIZATION N/A 05/24/2016   Procedure: Left Heart Cath and Coronary Angiography;  Surgeon: Marykay Lexavid W Harding, MD;  [facility];  Service: Cardiovascular;  Laterality: N/A;  . CARDIAC CATHETERIZATION N/A 05/24/2016   Procedure: Coronary Stent Intervention;  Surgeon: Marykay Lexavid W Harding, MD;  [facility];  Service: Cardiovascular;  Laterality: N/A;  2.5x20 Promus to Ostial/proximal circumflex  . CAROTID ENDARTERECTOMY  09/2008   Rt CEA  . LUNG BIOPSY  2013   Bx's suggest granulomatous dz.  . RT ANKLE   2013   HPI: Douglas Carr is a 51 yo male with PMH of CAD(recently admitted from 1/3-1/7 for MI, had PCI of Om2), HFrEF (EF 35%), DMII (on insulin), HLD and carotid disease. He was discharged home from that admission, but returned shortly there after with 8lb weight gain, worsening dyspnea and decreased UOP despite full compliance with his outpatient diuretic. His BNP was 610 and weight was increased from 239-246lbs. Admitted with severe ischemic cardiomyopathy, presenting with quickly resuscitated VT/VF cardiac arrest and requiring repeat PCI to left circumflex roughly 2 weeks after initial presentation with STEMI. Anoxic brain injury: MRI 06/02/16 showed Stable left thalamus 4 mm acute infarction.  Stable few scattered chronic cortical infarcts over the convexities bilaterally.  ETT 1/20-1/28; 1/29-1/31.  Traumatic intubation during code.  Subjective: alert, cooperative   Assessment / Plan / Recommendation  CHL IP CLINICAL IMPRESSIONS 06/11/2016  Therapy Diagnosis Moderate pharyngeal phase dysphagia  Clinical Impression Pt presents with a moderate, but slowly  improving, post-extubation dysphagia. There were marginal improvements in swallow, but not such that he is safe to begin full oral alimentation. FEES revealed diffuse edema throughout larynx, particularly the arytenoid cartilages and false vocal cords; there was a notable excrescence on the left false cord.  Visualization of the true cords was nearly obliterated by the edema of the false cords.   As a result, purees and honey-thick liquids penetrated the laryngeal vestibule.  Purees reached the vocal folds, but were not visualized to pass below the folds into the airway.  No spontaneous cough noted. Pt had difficulty carrying out protective/compensatory measures due to cognitive deficits.  Anticipate continued improvements as edema subsides.  For now, continue primary nutrition via Cortrak; pt may have 4 oz purees 3x/day with assistance and caution.  Encourage pt to clear throat/cough to expel penetrates.  D/W pt, who is reluctant but willing to accept plan; also D/W Dr. Jomarie Longs.    Impact on safety and function Moderate aspiration risk      CHL IP TREATMENT RECOMMENDATION 06/11/2016  Treatment Recommendations Other (Comment)     Prognosis 06/11/2016  Prognosis for Safe Diet Advancement Good  Barriers to Reach Goals --  Barriers/Prognosis Comment --    CHL IP DIET RECOMMENDATION 06/11/2016  SLP Diet Recommendations Alternative means - temporary;Ice chips PRN after oral care;Other (Comment)  Liquid Administration via --  Medication Administration Via alternative means  Compensations Minimize environmental distractions  Postural Changes Remain semi-upright after after feeds/meals (Comment)      CHL IP OTHER RECOMMENDATIONS 06/11/2016  Recommended Consults --  Oral Care Recommendations Oral care QID  Other Recommendations --      CHL IP FOLLOW UP RECOMMENDATIONS 06/10/2016  Follow up Recommendations Inpatient Rehab      CHL IP FREQUENCY AND DURATION 06/11/2016  Speech Therapy Frequency (ACUTE ONLY)  min 2x/week  Treatment Duration 2 weeks           CHL IP ORAL PHASE 06/11/2016  Oral Phase WFL  Oral - Pudding Teaspoon --  Oral - Pudding Cup --  Oral - Honey Teaspoon --  Oral - Honey Cup --  Oral - Nectar Teaspoon --  Oral - Nectar Cup --  Oral - Nectar Straw --  Oral - Thin Teaspoon --  Oral - Thin Cup --  Oral - Thin Straw --  Oral - Puree --  Oral - Mech Soft --  Oral - Regular --  Oral - Multi-Consistency --  Oral - Pill --  Oral Phase - Comment --    CHL IP PHARYNGEAL PHASE 06/11/2016  Pharyngeal Phase Impaired  Pharyngeal- Pudding Teaspoon --  Pharyngeal --  Pharyngeal- Pudding Cup --  Pharyngeal --  Pharyngeal- Honey Teaspoon Reduced tongue base retraction;Penetration/Aspiration during swallow;Pharyngeal residue - valleculae;Reduced airway/laryngeal closure  Pharyngeal Material enters airway, CONTACTS cords and then ejected out;Material enters airway, CONTACTS cords and not ejected out  Pharyngeal- Honey Cup --  Pharyngeal --  Pharyngeal- Nectar Teaspoon --  Pharyngeal --  Pharyngeal- Nectar Cup --  Pharyngeal --  Pharyngeal- Nectar Straw --  Pharyngeal --  Pharyngeal- Thin Teaspoon NT  Pharyngeal --  Pharyngeal- Thin Cup --  Pharyngeal --  Pharyngeal- Thin Straw --  Pharyngeal --  Pharyngeal- Puree Reduced airway/laryngeal closure;Reduced tongue base retraction;Penetration/Aspiration during swallow  Pharyngeal Material enters airway, CONTACTS cords and then ejected out;Material enters airway, CONTACTS cords and not ejected out  Pharyngeal- Mechanical Soft --  Pharyngeal --  Pharyngeal- Regular --  Pharyngeal --  Pharyngeal- Multi-consistency --  Pharyngeal --  Pharyngeal- Pill --  Pharyngeal --  Pharyngeal Comment --     CHL IP CERVICAL ESOPHAGEAL PHASE 06/08/2016  Cervical Esophageal Phase Impaired  Pudding Teaspoon --  Pudding Cup --  Honey Teaspoon Reduced cricopharyngeal relaxation  Honey Cup --  Nectar Teaspoon --  Nectar Cup --  Nectar  Straw --  Thin Teaspoon Reduced cricopharyngeal relaxation  Thin Cup --  Thin Straw --  Puree Reduced cricopharyngeal relaxation  Mechanical Soft --  Regular --  Multi-consistency --  Pill --  Cervical Esophageal Comment appears 2/2 reduced laryngeal elevation    CHL IP GO 06/04/2016  Functional Assessment Tool Used NOMS  Functional Limitations Swallowing  Swallow Current Status (R6045) CM  Swallow Goal Status (W0981) CL  Swallow Discharge Status (X9147) (None)  Motor Speech Current Status (W2956) (None)  Motor Speech Goal Status (O1308) (None)  Motor Speech Goal Status (M5784) (None)  Spoken Language Comprehension Current Status (O9629) (None)  Spoken Language Comprehension Goal Status (B2841) (None)  Spoken Language Comprehension Discharge Status (L2440) (None)  Spoken Language Expression Current Status (N0272) (None)  Spoken Language Expression Goal Status (Z3664) (None)  Spoken Language Expression Discharge Status (Q0347) (None)  Attention Current Status (Q2595) (None)  Attention Goal Status (G3875) (None)  Attention Discharge Status (I4332) (None)  Memory Current Status (R5188) (None)  Memory Goal Status (C1660) (None)  Memory Discharge Status (Y3016) (None)  Voice Current Status (W1093) (None)  Voice Goal Status (A3557) (None)  Voice Discharge Status (D2202) (None)  Other Speech-Language Pathology Functional Limitation (R4270) (None)  Other Speech-Language Pathology Functional Limitation Goal Status (W2376) (None)  Other Speech-Language Pathology Functional Limitation Discharge Status [phone]) (None)    Blenda Mounts Laurice 06/11/2016, 4:51 PM

## 2016-06-11 NOTE — Progress Notes (Addendum)
Pt pulled cortrak again for the 3rd time, [facility] on-call notified. Tube feeding stopped. Called  [facility] to find out if they can replace tube, they said nurse comes in at 0730. Cortrak is not placed during night shift, only at day shift.

## 2016-06-11 NOTE — Progress Notes (Addendum)
Physical Therapy Treatment Patient Details Name: Douglas Carr MRN: [id number] DOB: [birth year] Today's Date: 06/11/2016    History of Present Illness Pt is a 51 y/o male admitted secondary to STEMI at home with seizures. He is s/p PCI to circumflex. Pt also with an anoxic brain injury. Of note, pt recently admitted on 05/07/16-05/11/16 for an MI. PMH including but not limited to CHF, HTN, CAD, DM and PVD.    PT Comments    Patient progressing well towards PT goals. Pt A&O x4 today which is improvement from prior session. Tolerated standing with assist of 2 and mini marches in standing with BUE support. Continues to have bil knee instability and buckling with SLS. Mod A of 2 to take some pivotal steps to chair. Eager to be out of bed more often. RN educated to use stedy and assist of 2 for transfer out of bed for all meals. Discharge recommendation updated to SNF as CIR denied pt due to lack of support.See BP below. Supine BP 152/60 Sitting BP 137/52 Sitting post standing BP 103/93 Sitting post transfer 117/73   Follow Up Recommendations  SNF     Equipment Recommendations  Other (comment) (defer to next venue)    Recommendations for Other Services       Precautions / Restrictions Precautions Precautions: Fall Precaution Comments: watch BP Restrictions Weight Bearing Restrictions: No    Mobility  Bed Mobility Overal bed mobility: Needs Assistance Bed Mobility: Rolling;Sidelying to Sit Rolling: Min guard Sidelying to sit: Mod assist;HOB elevated       General bed mobility comments: Able to initiate bringing LEs to EOB, cues for sequencing/technique. Assist to elevate trunk to get to EOB. No dizziness.  Transfers Overall transfer level: Needs assistance Equipment used: 2 person hand held assist Transfers: Sit to/from [employer] Sit to Stand: Mod assist;+2 physical assistance Stand pivot transfers: +2 physical assistance;Mod assist       General  transfer comment: Able to stand pulling up on handles on back of recliner chair; Mod A to power to standing and Max A of 2 to take some steps to the chair. Bil knee instability. no dizziness.  Ambulation/Gait                 Stairs            Wheelchair Mobility    Modified Rankin (Stroke Patients Only) Modified Rankin (Stroke Patients Only) Pre-Morbid Rankin Score: No symptoms Modified Rankin: Severe disability     Balance Overall balance assessment: Needs assistance Sitting-balance support: Feet supported;Bilateral upper extremity supported Sitting balance-Leahy Scale: Fair Sitting balance - Comments: Able to sit with UEs on bed for support but no physical assist needed.   Standing balance support: During functional activity;Bilateral upper extremity supported Standing balance-Leahy Scale: Poor Standing balance comment: Requires Min A for static standing balance and UE holding ono recliner chair; marching in standing with assist for weighshifting and knee stability due to weakness.                    Cognition Arousal/Alertness: Awake/alert Behavior During Therapy: Flat affect Overall Cognitive Status: Impaired/Different from baseline         Following Commands: Follows one step commands with increased time Safety/Judgement: Decreased awareness of safety;Decreased awareness of deficits   Problem Solving: Slow processing;Decreased initiation;Difficulty sequencing;Requires verbal cues;Requires tactile cues General Comments: Able to state correct date, place and time today. Able to state children's ages.     Exercises  General Comments General comments (skin integrity, edema, etc.): Wife present during session.      Pertinent Vitals/Pain Pain Assessment: No/denies pain    Home Living                      Prior Function            PT Goals (current goals can now be found in the care plan section) Progress towards PT goals:  Progressing toward goals    Frequency    Min 3X/week      PT Plan Discharge plan needs to be updated    Co-evaluation             End of Session Equipment Utilized During Treatment: Gait belt Activity Tolerance: Patient tolerated treatment well Patient left: in chair;with call bell/phone within reach;with family/visitor present     Time: [id number] PT Time Calculation (min) (ACUTE ONLY): 30 min  Charges:  $Therapeutic Activity: 23-37 mins                    G Codes:      Douglas Carr A Douglas Carr 06/11/2016, 1:43 PM Douglas RedShauna Zakkery Carr, PT, DPT [phone]

## 2016-06-11 NOTE — Progress Notes (Addendum)
Patient ID: Douglas Carr, male   DOB: Mar 17, 1966, 51 y.o.   MRN: [id number]  PROGRESS NOTE    BREK REECE  WUJ:[id number] DOB: 06/04/1965 DOA: 05/24/2016  PCP: [name], MD   Brief Narrative:  51 y.o. male with a past medical history significant for CAD/ST EMI on 05/07/2016, cardiac arrest secondary to V. fib on 05/24/2016, chronic combined systolic and diastolic congestive heart failure, DM, HLD, HTN, PVD. Patient has an extensive cardiac history including the first MI at age 63. Patient was admitted to [facility] on 05/24/2016 after V. fib leading to cardiac arrest. Patient was down for an unknown period of time prior to being brought to the emergency room. He was taken emergently to the cath lab for evaluation of recent stent placement during previous admission. Since admission patient has had a very difficult and slow recovery.   TRH following for nutrition and physical deconditioning.   Assessment & Plan:   Principal Problem:   Cardiac arrest with ventricular fibrillation (HCC) - Admitted on 05/24/16 after cardiac arrest. Complicated cardiac history including MI at age 46, combined diastolic and systolic CHF w/ EF 25% and Grade 2 dysfunction, STEMI w/ cath and stent placement on 05/07/16.  - Currently w/ LifeVest and awaiting permanent ICD.  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 from cardiac arrest resulting in left thalamic stroke - Slow improvement - Per PT - CIR recommended, per rehab MD recommended SNF rehab  - No focal deficits other than dysphagia, failed MBS with continued Neuro recovery will ask SLP to re-eval for PO and if not improving will proceed with PEG tube, d/w Pt and friend abt this and they are agreeable to PEG if he doesn't do well on repeat SLp eval  - Per neurology there was a concern for seizure after cardiac arrest so pt placed on Keppra - NGT pulled out overnight,  await SLP input before further decisions abt this -now tolerating ice chips po    Acute respiratory failure with hypoxia - Intubated due to cardiac arrest, extubated 1/31 - Stable resp status since     MSSA on trach aspirate - Was on vanco and zosyn - Continue ancef through 2/7, stop now    Right CEA - S/p cardiac arrest - Carotid doppler 2/5 -  acute thrombus in the right proximal CCA, significant plaque versus thrombus at the proximal ICA and ECA - Seen by vascular surgery 2/5 - s/p cardiac arrest and found to have occluded his CEA site without significant sequela. Per vascular surgery he would not benefit from intervention at this time. Will need f/u in their office for left sided stenosis on duplex and get that scheduled for 6 months. Otherwise medical management unless his course changes.     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 - increase lantus at bedtime and novolog 4 units TID - Appreciate DM coordinator recommendations   DVT prophylaxis: Heparin subQ Code Status: full code  Family Communication: friend at the bedside this am  Disposition Plan: SNF when Feeding sorted out    Consultants:   TRH  Vascular   CCM  PCT  Neurology   SLP - NPO (2/6); on tube feeds  Diabetic coordinator   PT  IR  Procedures:  Carotid doppler 2/5 - acute thrombus in the right proximal CCA, significant plaque versus thrombus at the proximal ICA and ECA  Antimicrobials:   Continue ancef through 2/7   Subjective: Pulled out cortrak, wants to eat and get PT  Objective: Vitals:   06/11/16 0458 06/11/16 0502 06/11/16 0808 06/11/16 1119  BP:  (!) 138/55 (!) 161/68 (!) 148/73  Pulse:  71 74 76  Resp:  13 13 15   Temp:  98.6 F (37 C) 98.5 F (36.9 C) 98.7 F (37.1 C)  TempSrc:  Oral  Axillary Axillary  SpO2:  98% 100% 97%  Weight: 93 kg (205 lb)     Height:        Intake/Output Summary (Last 24 hours) at 06/11/16 1156 Last data filed at 06/11/16 1122  Gross per 24 hour  Intake             2582 ml  Output             2750 ml  Net             -168 ml   Filed Weights   06/09/16 0440 06/10/16 0447 06/11/16 0458  Weight: 93.4 kg (206 lb) 98.3 kg (216 lb 11.2 oz) 93 kg (205 lb)    Examination:  General exam: alert, awake, answers questions, some delay noted, oriented to self and place Respiratory system: Clear to auscultation. Respiratory effort normal. Cardiovascular system: S1 & S2 heard, Rate controlled; has SEM 2/6   Abd: soft, NT Central nervous system: Alert and oriented. No focal neurological deficits. Extremities: Symmetric 5 x 5 power. Skin: No rashes, lesions or ulcers Psychiatry: Mood & affect appropriate.   Data Reviewed: I have personally reviewed following labs and imaging studies  CBC:  Recent Labs Lab 06/05/16 0216 06/06/16 0544 06/10/16 0423  WBC 9.3 10.5 9.3  HGB 11.3* 11.9* 11.0*  HCT 36.3* 38.0* 36.2*  MCV 88.8 88.0 87.7  PLT 178 226 300   Basic Metabolic Panel:  Recent Labs Lab 06/05/16 0216 06/06/16 0544 06/08/16 0846 06/10/16 0423  NA 149* 150* 146* 143  K 3.4* 3.7 3.5 4.1  CL 113* 114* 109 109  CO2 27 24 25 23   GLUCOSE 136* 209* 216* 284*  BUN 23* 21* 12 18  CREATININE 1.00 0.97 0.80 0.96  CALCIUM 8.2* 8.3* 7.8* 8.0*  MG 2.4 2.4  --   --   PHOS 2.7 2.8  --   --    GFR: Estimated Creatinine Clearance: 104.8 mL/min (by C-G formula based on SCr of 0.96 mg/dL). Liver Function Tests: No results for input(s): AST, ALT, ALKPHOS, BILITOT, PROT, ALBUMIN in the last 168 hours. No results for input(s): LIPASE, AMYLASE in the last 168 hours. No results for input(s): AMMONIA in the last 168 hours. Coagulation Profile: No results for input(s): INR, PROTIME in the last 168 hours. Cardiac Enzymes: No results for input(s):  CKTOTAL, CKMB, CKMBINDEX, TROPONINI in the last 168 hours. BNP (last 3 results) No results for input(s): PROBNP in the last 8760 hours. HbA1C: No results for input(s): HGBA1C in the last 72 hours. CBG:  Recent Labs Lab 06/10/16 2116 06/11/16 0016 06/11/16 0454 06/11/16 0807 06/11/16 1119  GLUCAP 283* 265* 210* 160* 193*   Lipid Profile:  Recent Labs  06/08/16 1434  TRIG 184*   Thyroid Function Tests: No results for input(s): TSH, T4TOTAL, FREET4, T3FREE, THYROIDAB in the last 72 hours. Anemia Panel: No results for input(s): VITAMINB12, FOLATE, FERRITIN, TIBC, IRON, RETICCTPCT in the last  72 hours. Urine analysis:    Component Value Date/Time   COLORURINE YELLOW 06/01/2016 1715   APPEARANCEUR HAZY (A) 06/01/2016 1715   LABSPEC 1.023 06/01/2016 1715   PHURINE 5.0 06/01/2016 1715   GLUCOSEU NEGATIVE 06/01/2016 1715   HGBUR LARGE (A) 06/01/2016 1715   BILIRUBINUR NEGATIVE 06/01/2016 1715   KETONESUR NEGATIVE 06/01/2016 1715   PROTEINUR 30 (A) 06/01/2016 1715   UROBILINOGEN 0.2 08/19/2011 2051   NITRITE NEGATIVE 06/01/2016 1715   LEUKOCYTESUR MODERATE (A) 06/01/2016 1715   Sepsis Labs: @LABRCNTIP (procalcitonin:4,lacticidven:4)   ) No results found for this or any previous visit (from the past 240 hour(s)).    Radiology Studies: Ct Angio Head W Or Wo Contrast  Result Date: 06/09/2016 CLINICAL DATA:  Stroke. Cardiac arrest on January 20th. Anoxic brain injury. EXAM: CT ANGIOGRAPHY HEAD TECHNIQUE: Multidetector CT imaging of the head was performed using the standard protocol during bolus administration of intravenous contrast. Multiplanar CT image reconstructions and MIPs were obtained to evaluate the vascular anatomy. CONTRAST:  50 mL Isovue 370 IV COMPARISON:  Brain MRI 06/02/2016, 05/28/2016 FINDINGS: CT HEAD Brain: No mass lesion, intraparenchymal hemorrhage or extra-axial collection. No evidence of acute cortical infarct. Faint hypoattenuation at the left thalamus  and cerebral peduncle at the site of recently demonstrated acute infarct. No other focal parenchymal abnormality. Vascular: No hyperdense vessel or unexpected calcification. Skull: Normal visualized skull base, calvarium and extracranial soft tissues. Sinuses/Orbits: No sinus fluid levels or advanced mucosal thickening. No mastoid effusion. Normal orbits. CTA HEAD Anterior circulation: --Intracranial internal carotid arteries: The visualized portion of the right internal carotid artery, beginning just inferior to the skull base, is occluded. The left ICA is normal. --Anterior cerebral arteries: Normal. --Middle cerebral arteries: Normal. --Posterior communicating arteries: Absent bilaterally. Posterior circulation: --Posterior cerebral arteries: Normal. --Superior cerebellar arteries: Normal. --Basilar artery: Normal. --Anterior inferior cerebellar arteries: Not clearly visualized, which is not uncommon. --Posterior inferior cerebellar arteries: Normal. Venous sinuses: As permitted by contrast timing, patent. Anatomic variants: None. Delayed phase: No abnormal parenchymal enhancement. IMPRESSION: 1. 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 The occlusion is favored to be subacute, given the retrospectively asymmetric appearance of the internal carotid flow voids on the prior MRI studies dating back to 05/24/2016. CTA of the neck could be performed to determine the most proximal extent of the occlusion. 2. Otherwise normal CTA of the brain. 3. Faint hypoattenuation at the site of previously demonstrated small left thalamic infarct. These results will be called to the ordering clinician or representative by the Radiologist Assistant, and communication documented in the PACS or zVision Dashboard. Electronically Signed   By: Deatra RobinsonKevin  Herman M.D.   On:  06/09/2016 13:46   Dg Abd Portable 1v  Result Date: 06/09/2016 CLINICAL DATA:  Feeding tube placement. EXAM: PORTABLE ABDOMEN - 1 VIEW COMPARISON:  None. FINDINGS: Feeding tube tip is in the third portion of the duodenum in excellent position. Bowel gas pattern is normal. No acute bone abnormality. Contrast in the renal collecting systems from recent CT scan. IMPRESSION: Feeding tube tip in the third portion of the duodenum in good position. Electronically Signed   By: Francene BoyersJames  Maxwell M.D.   On: 06/09/2016 16:05   Dg Swallowing Func-speech Pathology  Result Date: 06/08/2016 Objective Swallowing Evaluation: Type of Study: MBS-Modified Barium Swallow Study Patient Details Name: Margarita SermonsClifford M Hietala MRN: [id number] Date of Birth: 10/29/1965  Today's Date: 06/08/2016 Time: SLP Start Time (ACUTE ONLY): 1214-SLP Stop Time (ACUTE ONLY): 1231 SLP Time Calculation (min) (ACUTE ONLY): 17 min Past Medical History: Past Medical History: Diagnosis Date . Acute ST elevation myocardial infarction (STEMI) involving left circumflex coronary artery (HCC) 05/07/2016  PCI to Cx-OM . Anginal pain (HCC)   secondary to sm. vessel disease . Anxiety  . Arthritis   BIL KNEE PAIN AND BIL ANKLE PAIN . Bone spur of ankle  . Cardiac arrest (HCC) 05/24/2016  with v fib . 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 Coronary artery disease involving native coronary artery of native heart with angina pectoris (HCC) 05/24/2016  Remote MI at 51 years of age, Last cath 2010-diffuse non-obstructive disease; last echo 06/16/08 -normal LV function, moderate concentric hypertrophy; nuc 08/2008 no ischemia;  medical therapy; STEMI May 07 2016 - PCI to Cx-OM . Diabetes mellitus   ON ORAL MEDICATION AND INSULIN . Dilated cardiomyopathy (HCC) 05/24/2016  EF 325-30% by Echo post STEMI (previously 30-35%)  . Hyperlipidemia  . Hypertension  . Myocardial infarction 1996  Post MI . Peripheral vascular disease (HCC)    HAS LEFT CAROTID ARTERY STENOSIS   AND IS S/P RIGHT CAROTID ENDARTERECTOMY 2010 Last carotid dopplers 01/08/2012 wth patent endarterectomy site . Status post coronary artery stent placement  Past Surgical History: Past Surgical History: Procedure Laterality Date . APPENDECTOMY   . CARDIAC CATHETERIZATION    FEB 2010, significant branch vessel disease wth diag, marginal, PDA & PLA, nml. LV function . CARDIAC CATHETERIZATION N/A 05/07/2016  Procedure: Left Heart Cath and Coronary Angiography;  Surgeon: Peter M Swaziland, MD;  Location: [facility] INVASIVE CV LAB;  Service: Cardiovascular;  Laterality: N/A; . CARDIAC CATHETERIZATION N/A 05/07/2016  Procedure: Coronary Stent Intervention;  Surgeon: Peter M Swaziland, MD;  Location: [facility] INVASIVE CV LAB;  Service: Cardiovascular;  Laterality: N/A; . CARDIAC CATHETERIZATION N/A 05/24/2016  Procedure: Left Heart Cath and Coronary Angiography;  Surgeon: Marykay Lex, MD;  Location: [facility] INVASIVE CV LAB;  Service: Cardiovascular;  Laterality: N/A; . CARDIAC CATHETERIZATION N/A 05/24/2016  Procedure: Coronary Stent Intervention;  Surgeon: Marykay Lex, MD;  Location: [facility] INVASIVE CV LAB;  Service: Cardiovascular;  Laterality: N/A;  2.5x20 Promus to Ostial/proximal circumflex . CAROTID ENDARTERECTOMY  09/2008  Rt CEA . LUNG BIOPSY  2013  Bx's suggest granulomatous dz. . RT ANKLE   2013 HPI: Mr. Heitz is a 51 yo male with PMH of CAD(recently admitted from 1/3-1/7 for MI, had PCI of Om2), HFrEF (EF 35%), DMII (on insulin), HLD and carotid disease. He was discharged home from that admission, but returned shortly there after with 8lb weight gain, worsening dyspnea and decreased UOP despite full compliance with his outpatient diuretic. His BNP was 610 and weight was increased from 239-246lbs. Admitted with severe ischemic cardiomyopathy, presenting with quickly resuscitated VT/VF cardiac arrest and requiring repeat PCI to left circumflex roughly 2 weeks after initial presentation with STEMI. Anoxic  brain injury: MRI 06/02/16 showed Stable left thalamus 4 mm acute infarction. Stable few scattered chronic cortical infarcts over the convexities bilaterally. Subjective: Alert, eager to eat Assessment / Plan / Recommendation CHL IP CLINICAL IMPRESSIONS 06/08/2016 Therapy Diagnosis Severe pharyngeal phase dysphagia Clinical Impression Patient presents with severe pharyngeal dysphagia with significant silent aspiration of thin liquids (~1/2 tsp), moderate aspiration of honey-thick liquids with cough response, and moderate silent aspiration of pureed. Oral stage of swallowing appeared within functional limits. Swallow initation delayed to  the level of the pyriform sinuses with thin liquids. Hyolaryngeal elevation significantly reduced, leading to incomplete epiglottic deflection, reduced amplitude and duration of UES opening, obstruction of bolus flow which spills into the laryngeal vestibule and is silently aspirated during the swallow. Moderate amount of residue remains in the valleculae/ post-cricoid segment with all consistencies trialed (thin, honey, puree), leading to penetration and ultimately aspiration after the swallow. Chin tuck was trialed x1 with thin liquids but is ineffective in preventing aspiration. Based on the above deficits, diet initiation is not advised at this time. Patient may benefit from exercises to address physiologic impairments as well as training in compensatory techniques to improve swallow function. Recommend patient remain NPO; will likely need temporary source of alternative nutrition. SLP will continue to follow.  Impact on safety and function Severe aspiration risk   CHL IP TREATMENT RECOMMENDATION 06/08/2016 Treatment Recommendations Therapy as outlined in treatment plan below   Prognosis 06/08/2016 Prognosis for Safe Diet Advancement Fair Barriers to Reach Goals Cognitive deficits;Severity of deficits Barriers/Prognosis Comment -- CHL IP DIET RECOMMENDATION 06/08/2016 SLP Diet  Recommendations NPO;Alternative means - temporary Liquid Administration via -- Medication Administration Via alternative means Compensations -- Postural Changes --   CHL IP OTHER RECOMMENDATIONS 06/08/2016 Recommended Consults -- Oral Care Recommendations Oral care QID Other Recommendations --   CHL IP FOLLOW UP RECOMMENDATIONS 06/08/2016 Follow up Recommendations Inpatient Rehab   CHL IP FREQUENCY AND DURATION 06/08/2016 Speech Therapy Frequency (ACUTE ONLY) min 2x/week Treatment Duration 2 weeks      CHL IP ORAL PHASE 06/08/2016 Oral Phase WFL Oral - Pudding Teaspoon -- Oral - Pudding Cup -- Oral - Honey Teaspoon -- Oral - Honey Cup -- Oral - Nectar Teaspoon -- Oral - Nectar Cup -- Oral - Nectar Straw -- Oral - Thin Teaspoon -- Oral - Thin Cup -- Oral - Thin Straw -- Oral - Puree -- Oral - Mech Soft -- Oral - Regular -- Oral - Multi-Consistency -- Oral - Pill -- Oral Phase - Comment --  CHL IP PHARYNGEAL PHASE 06/08/2016 Pharyngeal Phase Impaired Pharyngeal- Pudding Teaspoon -- Pharyngeal -- Pharyngeal- Pudding Cup -- Pharyngeal -- Pharyngeal- Honey Teaspoon Reduced airway/laryngeal closure;Reduced laryngeal elevation;Reduced epiglottic inversion;Penetration/Apiration after swallow;Pharyngeal residue - valleculae;Pharyngeal residue - cp segment;Moderate aspiration Pharyngeal Material enters airway, passes BELOW cords and not ejected out despite cough attempt by patient Pharyngeal- Honey Cup -- Pharyngeal -- Pharyngeal- Nectar Teaspoon -- Pharyngeal -- Pharyngeal- Nectar Cup -- Pharyngeal -- Pharyngeal- Nectar Straw -- Pharyngeal -- Pharyngeal- Thin Teaspoon Delayed swallow initiation-pyriform sinuses;Reduced laryngeal elevation;Reduced airway/laryngeal closure;Penetration/Aspiration during swallow;Penetration/Apiration after swallow;Pharyngeal residue - cp segment;Pharyngeal residue - valleculae;Significant aspiration (Amount) Pharyngeal Material enters airway, passes BELOW cords without attempt by patient to eject out  (silent aspiration) Pharyngeal- Thin Cup -- Pharyngeal -- Pharyngeal- Thin Straw -- Pharyngeal -- Pharyngeal- Puree Reduced epiglottic inversion;Reduced laryngeal elevation;Moderate aspiration;Penetration/Apiration after swallow;Pharyngeal residue - valleculae;Pharyngeal residue - cp segment;Reduced airway/laryngeal closure Pharyngeal Material enters airway, passes BELOW cords without attempt by patient to eject out (silent aspiration) Pharyngeal- Mechanical Soft -- Pharyngeal -- Pharyngeal- Regular -- Pharyngeal -- Pharyngeal- Multi-consistency -- Pharyngeal -- Pharyngeal- Pill -- Pharyngeal -- Pharyngeal Comment --  CHL IP CERVICAL ESOPHAGEAL PHASE 06/08/2016 Cervical Esophageal Phase Impaired Pudding Teaspoon -- Pudding Cup -- Honey Teaspoon Reduced cricopharyngeal relaxation Honey Cup -- Nectar Teaspoon -- Nectar Cup -- Nectar Straw -- Thin Teaspoon Reduced cricopharyngeal relaxation Thin Cup -- Thin Straw -- Puree Reduced cricopharyngeal relaxation Mechanical Soft -- Regular -- Multi-consistency -- Pill -- Cervical Esophageal Comment appears  2/2 reduced laryngeal elevation CHL IP GO 06/04/2016 Functional Assessment Tool Used NOMS Functional Limitations Swallowing Swallow Current Status (Z6109) CM Swallow Goal Status (U0454) CL Swallow Discharge Status (U9811) (None) Motor Speech Current Status (B1478) (None) Motor Speech Goal Status (G9562) (None) Motor Speech Goal Status (Z3086) (None) Spoken Language Comprehension Current Status (V7846) (None) Spoken Language Comprehension Goal Status (N6295) (None) Spoken Language Comprehension Discharge Status [phone]) (None) Spoken Language Expression Current Status [phone]) (None) Spoken Language Expression Goal Status [phone]) (None) Spoken Language Expression Discharge Status [phone]) (None) Attention Current Status (I3474) (None) Attention Goal Status (Q5956) (None) Attention Discharge Status [phone]) (None) Memory Current Status (E3329) (None) Memory Goal Status (J1884)  (None) Memory Discharge Status (Z6606) (None) Voice Current Status (T0160) (None) Voice Goal Status (F0932) (None) Voice Discharge Status (T5573) (None) Other Speech-Language Pathology Functional Limitation [phone]) (None) Other Speech-Language Pathology Functional Limitation Goal Status (K2706) (None) Other Speech-Language Pathology Functional Limitation Discharge Status [phone]) (None) Arlana Lindau 06/08/2016, 2:37 PM Rondel Baton, MS CF-SLP Speech-Language Pathologist [phone]                Scheduled Meds: . aspirin  81 mg Per Tube Daily  . atorvastatin  80 mg Per Tube q1800  . carvedilol  6.25 mg Oral BID WC  . chlorhexidine  15 mL Mouth Rinse BID  . docusate  100 mg Per Tube BID  . famotidine  20 mg Oral Daily  . feeding supplement (PRO-STAT SUGAR FREE 64)  30 mL Per Tube Daily  . free water  200 mL Per Tube QID  . heparin  5,000 Units Subcutaneous Q8H  . insulin aspart  0-15 Units Subcutaneous Q4H  . insulin aspart  4 Units Subcutaneous TID WC  . insulin glargine  15 Units Subcutaneous QHS  . levETIRAcetam  1,000 mg Intravenous Q12H  . lisinopril  5 mg Oral Daily  . mouth rinse  15 mL Mouth Rinse q12n4p  . multivitamin with minerals  1 tablet Oral Daily  . torsemide  20 mg Oral Daily   Continuous Infusions: . dextrose 5 % and 0.45% NaCl 50 mL/hr at 06/10/16 2148  . feeding supplement (OSMOLITE 1.2 CAL) 1,000 mL (06/10/16 1414)     LOS: 18 days    Time spent: 35 minutes  Greater than 50% of the time spent on counseling and coordinating the care.   Zannie Cove, MD [facility] Pager [phone]  If 7PM-7AM, please contact night-coverage [email or web address] Password [email or web address] 06/11/2016, 11:56 AM

## 2016-06-11 NOTE — Progress Notes (Signed)
Notified by Marchelle FolksAmanda Speech Therapy, patient needed cortrak tube replaced earlier this afternoon.  Spoke with Dorothea Ogleeanne Barbato Dietician with Cortrak team and was notified tube would not be able to be replaced until tomorrow morning.  Dr. Jomarie LongsJoseph paged and notified.  Primary RN Ayaba made aware.  Colman Caterarpley, Falana Clagg Danielle

## 2016-06-12 LAB — CBC
HEMATOCRIT: 34.7 % — AB (ref 39.0–52.0)
Hemoglobin: 10.7 g/dL — ABNORMAL LOW (ref 13.0–17.0)
MCH: 27 pg (ref 26.0–34.0)
MCHC: 30.8 g/dL (ref 30.0–36.0)
MCV: 87.4 fL (ref 78.0–100.0)
PLATELETS: 266 10*3/uL (ref 150–400)
RBC: 3.97 MIL/uL — ABNORMAL LOW (ref 4.22–5.81)
RDW: 13.5 % (ref 11.5–15.5)
WBC: 9.1 10*3/uL (ref 4.0–10.5)

## 2016-06-12 LAB — BASIC METABOLIC PANEL
Anion gap: 9 (ref 5–15)
BUN: 13 mg/dL (ref 6–20)
CO2: 27 mmol/L (ref 22–32)
CREATININE: 0.89 mg/dL (ref 0.61–1.24)
Calcium: 8.4 mg/dL — ABNORMAL LOW (ref 8.9–10.3)
Chloride: 107 mmol/L (ref 101–111)
GFR calc Af Amer: >60 mL/min (ref >60)
GFR calc non Af Amer: >60 mL/min (ref >60)
Glucose, Bld: 217 mg/dL — ABNORMAL HIGH (ref 65–99)
POTASSIUM: 4.1 mmol/L (ref 3.5–5.1)
Sodium: 143 mmol/L (ref 135–145)

## 2016-06-12 LAB — GLUCOSE, CAPILLARY
GLUCOSE-CAPILLARY: 191 mg/dL — AB (ref 65–99)
GLUCOSE-CAPILLARY: 204 mg/dL — AB (ref 65–99)
Glucose-Capillary: 184 mg/dL — ABNORMAL HIGH (ref 65–99)
Glucose-Capillary: 212 mg/dL — ABNORMAL HIGH (ref 65–99)
Glucose-Capillary: 173 mg/dL — ABNORMAL HIGH (ref 65–99)

## 2016-06-12 LAB — PROTIME-INR
INR: 1.46
Prothrombin Time: 17.8 seconds — ABNORMAL HIGH (ref 11.4–15.2)

## 2016-06-12 MED ORDER — LOPERAMIDE HCL 2 MG PO CAPS
2.0000 mg | ORAL_CAPSULE | Freq: Two times a day (BID) | ORAL | Status: DC
  Administered 2016-06-12 – 2016-06-14 (×6): 2 mg via ORAL
  Filled 2016-06-12 (×6): qty 1

## 2016-06-12 MED ORDER — ASPIRIN 81 MG PO CHEW
81.0000 mg | CHEWABLE_TABLET | Freq: Every day | ORAL | Status: DC
  Administered 2016-06-13 – 2016-06-17 (×5): 81 mg via ORAL
  Filled 2016-06-12 (×6): qty 1

## 2016-06-12 MED ORDER — ATORVASTATIN CALCIUM 80 MG PO TABS
80.0000 mg | ORAL_TABLET | Freq: Every day | ORAL | Status: DC
  Administered 2016-06-12 – 2016-06-16 (×5): 80 mg via ORAL
  Filled 2016-06-12 (×5): qty 1

## 2016-06-12 NOTE — Progress Notes (Signed)
Nutrition Follow-up  DOCUMENTATION CODES:   Obesity unspecified  INTERVENTION:    When diet is advanced, will add supplements as needed.  NUTRITION DIAGNOSIS:   Inadequate oral intake related to inability to eat as evidenced by NPO status.  Ongoing  GOAL:   Patient will meet greater than or equal to 90% of their needs  Unmet  MONITOR:   Diet advancement, PO intake, I & O's  REASON FOR ASSESSMENT:   Consult Enteral/tube feeding initiation and management  ASSESSMENT:   51 yo male with PMH of CAD(recently admitted from 1/3-1/347for MI, had PCI of Om2), HFrEF (EF 35%), DMII (on insulin), HLD and carotid disease. Pt admitted with OOH arrest, now with onset seizures.   SLP continues to follow patient for dysphagia. Patient is now being allowed 4 ounces of pureed foods QID with RN. Diet order remains NPO.  Patient is no longer receiving TF. Cortrak feeding tube was pulled out by patient on 2/7.  RN reports that patient does not want a feeding tube of any kind (NG or PEG). Stools remain liquid consistency. Rectal tube in place. Colace is on MAR, but RN has not given it in several days.  Diet Order:   NPO  Skin:  Reviewed, no issues  Last BM:  2/7 (rectal tube)  Height:   Ht Readings from Last 1 Encounters:  05/24/16 5\' 9"  (1.753 m)    Weight:   Wt Readings from Last 1 Encounters:  06/12/16 204 lb 14.4 oz (92.9 kg)   06/09/16 206 lb (93.4 kg)   06/05/16 227 lb 11.8 oz (103.3 kg)    Ideal Body Weight:  72.7 kg  BMI:  Body mass index is 30.26 kg/m.  Estimated Nutritional Needs:   Kcal:  2000-2200  Protein:  105-115 grams  Fluid:  2 L/day  EDUCATION NEEDS:   No education needs identified at this time  Joaquin CourtsKimberly Birtha Hatler, RD, LDN, CNSC Pager [phone] After Hours Pager [phone]

## 2016-06-12 NOTE — Progress Notes (Addendum)
Patient ID: Douglas Carr, male   DOB: [birth year], 51 y.o.   MRN: [id number]  PROGRESS NOTE    Douglas Carr  WUJ:[id number] DOB: Apr 08, 1966 DOA: 05/24/2016  PCP: [name], MD   Brief Narrative:  51 y.o. male with a past medical history significant for CAD/ST EMI on 05/07/2016, cardiac arrest secondary to V. fib on 05/24/2016, chronic combined systolic and diastolic congestive heart failure, DM, HLD, HTN, PVD. Patient has an extensive cardiac history including the first MI at age 19. Patient was admitted to [facility] on 05/24/2016 after V. fib leading to cardiac arrest. Patient was down for an unknown period of time prior to being brought to the emergency room. He was taken emergently to the cath lab for evaluation of recent stent placement during previous admission. Since admission patient has had a very difficult and slow recovery.   TRH following for nutrition and physical deconditioning.   Assessment & Plan:     Cardiac arrest with ventricular fibrillation (HCC) - Admitted on 05/24/16 after cardiac arrest. Complicated cardiac history including MI at age 15, combined diastolic and systolic CHF w/ EF 25% and Grade 2 dysfunction, STEMI w/ cath and stent placement on 05/07/16.  - Currently w/ LifeVest and awaiting permanent ICD.  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 - Slow improvement - Per PT - CIR recommended, per rehab MD recommended SNF rehab  - No focal deficits other than dysphagia, failed MBS with continued Neuro recovery, s/p SLP eval - d/w SLP , expect laryngeal edema to improve, SLP eval in 3days if fails then PEG, d/w IR - Per neurology there was a concern for seizure after cardiac arrest so pt placed on Keppra - now on pureed diet PRN    Acute respiratory failure with hypoxia - Intubated due to cardiac arrest, extubated 1/31 - Stable resp status  since     MSSA on trach aspirate - Was on vanco and zosyn - Continue ancef through 2/7, stop now    Right CEA - S/p cardiac arrest - Carotid doppler 2/5 -  acute thrombus in the right proximal CCA, significant plaque versus thrombus at the proximal ICA and ECA - Seen by vascular surgery 2/5 - s/p cardiac arrest and found to have occluded his CEA site without significant sequela. Per vascular surgery he would not benefit from intervention at this time. Will need f/u in their office for left sided stenosis on duplex and get that scheduled for 6 months. Otherwise medical management unless his course changes.     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 - increase lantus at bedtime and novolog 4 units TID - Appreciate DM coordinator recommendations    Diarrhea -doubt infectious likely tube feeds related -add imodium  DVT prophylaxis: Heparin subQ Code Status: full code  Family Communication: friend at the bedside this am  Disposition Plan: SNF when Feeding sorted out    Consultants:   TRH  Vascular   CCM  PCT  Neurology   SLP - NPO (2/6); on tube feeds  Diabetic coordinator   PT  IR  Procedures:   Carotid doppler 2/5 - acute thrombus in the right proximal CCA, significant plaque versus thrombus at the proximal ICA  and ECA  Antimicrobials:   Continue ancef through 2/7   Subjective: Waiting for Cortrak tube  Objective: Vitals:   06/11/16 2352 06/12/16 0410 06/12/16 0737 06/12/16 1159  BP: (!) 90/57 (!) 109/51 100/79 (!) 121/34  Pulse: 70 71 62 78  Resp:   16 18  Temp:  98.4 F (36.9 C) 98.4 F (36.9 C) 98.3 F (36.8 C)  TempSrc:  Oral Oral Oral  SpO2: 95% 98% 98% 98%  Weight:  92.9 kg (204 lb 14.4 oz)    Height:        Intake/Output Summary (Last 24 hours) at  06/12/16 1359 Last data filed at 06/12/16 1300  Gross per 24 hour  Intake              110 ml  Output             2150 ml  Net            -2040 ml   Filed Weights   06/10/16 0447 06/11/16 0458 06/12/16 0410  Weight: 98.3 kg (216 lb 11.2 oz) 93 kg (205 lb) 92.9 kg (204 lb 14.4 oz)    Examination:  General exam: alert, awake, answers questions, some delay noted, oriented to self and place Respiratory system: Clear to auscultation. Respiratory effort normal. Cardiovascular system: S1 & S2 heard, Rate controlled; has SEM 2/6   Abd: soft, NT Central nervous system: Alert and oriented. No focal neurological deficits. Extremities: Symmetric 5 x 5 power. Skin: No rashes, lesions or ulcers Psychiatry: Mood & affect appropriate.   Data Reviewed: I have personally reviewed following labs and imaging studies  CBC:  Recent Labs Lab 06/06/16 0544 06/10/16 0423 06/12/16 0407  WBC 10.5 9.3 9.1  HGB 11.9* 11.0* 10.7*  HCT 38.0* 36.2* 34.7*  MCV 88.0 87.7 87.4  PLT 226 300 266   Basic Metabolic Panel:  Recent Labs Lab 06/06/16 0544 06/08/16 0846 06/10/16 0423 06/12/16 0407  NA 150* 146* 143 143  K 3.7 3.5 4.1 4.1  CL 114* 109 109 107  CO2 24 25 23 27   GLUCOSE 209* 216* 284* 217*  BUN 21* 12 18 13   CREATININE 0.97 0.80 0.96 0.89  CALCIUM 8.3* 7.8* 8.0* 8.4*  MG 2.4  --   --   --   PHOS 2.8  --   --   --    GFR: Estimated Creatinine Clearance: 113 mL/min (by C-G formula based on SCr of 0.89 mg/dL). Liver Function Tests: No results for input(s): AST, ALT, ALKPHOS, BILITOT, PROT, ALBUMIN in the last 168 hours. No results for input(s): LIPASE, AMYLASE in the last 168 hours. No results for input(s): AMMONIA in the last 168 hours. Coagulation Profile:  Recent Labs Lab 06/12/16 1208  INR 1.46   Cardiac Enzymes: No results for input(s): CKTOTAL, CKMB, CKMBINDEX, TROPONINI in the last 168 hours. BNP (last 3 results) No results for input(s): PROBNP in the last 8760  hours. HbA1C: No results for input(s): HGBA1C in the last 72 hours. CBG:  Recent Labs Lab 06/11/16 2053 06/11/16 2348 06/12/16 0401 06/12/16 0736 06/12/16 1158  GLUCAP 216* 219* 191* 204* 184*   Lipid Profile: No results for input(s): CHOL, HDL, LDLCALC, TRIG, CHOLHDL, LDLDIRECT in the last 72 hours. Thyroid Function Tests: No results for input(s): TSH, T4TOTAL, FREET4, T3FREE, THYROIDAB in the last 72 hours. Anemia Panel: No results for input(s): VITAMINB12, FOLATE, FERRITIN, TIBC, IRON, RETICCTPCT in the last 72 hours. Urine analysis:    Component Value Date/Time  COLORURINE YELLOW 06/01/2016 1715   APPEARANCEUR HAZY (A) 06/01/2016 1715   LABSPEC 1.023 06/01/2016 1715   PHURINE 5.0 06/01/2016 1715   GLUCOSEU NEGATIVE 06/01/2016 1715   HGBUR LARGE (A) 06/01/2016 1715   BILIRUBINUR NEGATIVE 06/01/2016 1715   KETONESUR NEGATIVE 06/01/2016 1715   PROTEINUR 30 (A) 06/01/2016 1715   UROBILINOGEN 0.2 08/19/2011 2051   NITRITE NEGATIVE 06/01/2016 1715   LEUKOCYTESUR MODERATE (A) 06/01/2016 1715   Sepsis Labs: @LABRCNTIP (procalcitonin:4,lacticidven:4)   ) No results found for this or any previous visit (from the past 240 hour(s)).    Radiology Studies: Ct Abdomen Wo Contrast  Result Date: 06/12/2016 CLINICAL DATA:  Dysphagia. Please evaluate anatomy for potential percutaneous gastrostomy tube placement. EXAM: CT ABDOMEN WITHOUT CONTRAST TECHNIQUE: Multidetector CT imaging of the abdomen was performed following the standard protocol without IV contrast. COMPARISON:  None. FINDINGS: Lower chest: Limited visualization of lower thorax demonstrates minimal dependent subpleural ground-glass atelectasis. No focal airspace opacities. No pleural effusion. Normal heart size.  No pericardial effusion. Hepatobiliary: Normal hepatic contour. Layering high attenuation debris is suspected within the gallbladder potentially indicative of biliary sludge. No definite radiopaque gallstones. No  definitive pericholecystic stranding on this noncontrast examination. Pancreas: Normal noncontrast appearance of the pancreas Spleen: Punctate granuloma within an otherwise normal-appearing spleen. Adrenals/Urinary Tract: No renal stones. Minimal amount of symmetric bilateral perinephric stranding. No urine obstruction. Normal noncontrast appearance the bilateral adrenal glands. Stomach/Bowel: There is an adequate percutaneous window to the ventral aspect of the gastric antrum without interposed colon or liver. Enteric contrast is seen throughout the colon. Rectal tube appropriately position. No evidence of enteric obstruction. No pneumoperitoneum, pneumatosis or portal venous gas. Vascular/Lymphatic: Moderate amount of the centric calcified atherosclerotic plaque within the abdominal aorta and pelvic vasculature. No bulky retroperitoneal, mesenteric, pelvic or inguinal lymphadenopathy. Other: Subcutaneous stranding about the under surface of the abdominal pannus, likely a sequela of subcutaneous medication administration. Small umbilical mesenteric fat containing hernia. Musculoskeletal: No acute or aggressive osseous abnormalities. Stigmata of DISH within the thoracic spine. Moderate DDD of L5-S1 with disc space height loss, endplate irregularity and sclerosis. IMPRESSION: 1. Gastric anatomy amenable to potential percutaneous gastrostomy tube placement. 2. Appropriate positioned rectal tube. No evidence of enteric obstruction. 3. Potential biliary sludge. Electronically Signed   By: Simonne Come M.D.   On: 06/12/2016 09:48   Ct Angio Head W Or Wo Contrast  Result Date: 06/09/2016 CLINICAL DATA:  Stroke. Cardiac arrest on January 20th. Anoxic brain injury. EXAM: CT ANGIOGRAPHY HEAD TECHNIQUE: Multidetector CT imaging of the head was performed using the standard protocol during bolus administration of intravenous contrast. Multiplanar CT image reconstructions and MIPs were obtained to evaluate the vascular  anatomy. CONTRAST:  50 mL Isovue 370 IV COMPARISON:  Brain MRI 06/02/2016, 05/28/2016 FINDINGS: CT HEAD Brain: No mass lesion, intraparenchymal hemorrhage or extra-axial collection. No evidence of acute cortical infarct. Faint hypoattenuation at the left thalamus and cerebral peduncle at the site of recently demonstrated acute infarct. No other focal parenchymal abnormality. Vascular: No hyperdense vessel or unexpected calcification. Skull: Normal visualized skull base, calvarium and extracranial soft tissues. Sinuses/Orbits: No sinus fluid levels or advanced mucosal thickening. No mastoid effusion. Normal orbits. CTA HEAD Anterior circulation: --Intracranial internal carotid arteries: The visualized portion of the right internal carotid artery, beginning just inferior to the skull base, is occluded. The left ICA is normal. --Anterior cerebral arteries: Normal. --Middle cerebral arteries: Normal. --Posterior communicating arteries: Absent bilaterally. Posterior circulation: --Posterior cerebral arteries: Normal. --Superior cerebellar arteries: Normal. --Basilar  artery: Normal. --Anterior inferior cerebellar arteries: Not clearly visualized, which is not uncommon. --Posterior inferior cerebellar arteries: Normal. Venous sinuses: As permitted by contrast timing, patent. Anatomic variants: None. Delayed phase: No abnormal parenchymal enhancement. IMPRESSION: 1. 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 The occlusion is favored to be subacute, given the retrospectively asymmetric appearance of the internal carotid flow voids on the prior MRI studies dating back to 05/24/2016. CTA of the neck could be performed to determine the most proximal extent of the occlusion. 2. Otherwise normal CTA of the brain. 3. Faint hypoattenuation at the site of previously  demonstrated small left thalamic infarct. These results will be called to the ordering clinician or representative by the Radiologist Assistant, and communication documented in the PACS or zVision Dashboard. Electronically Signed   By: Deatra Robinson M.D.   On: 06/09/2016 13:46   Dg Abd Portable 1v  Result Date: 06/09/2016 CLINICAL DATA:  Feeding tube placement. EXAM: PORTABLE ABDOMEN - 1 VIEW COMPARISON:  None. FINDINGS: Feeding tube tip is in the third portion of the duodenum in excellent position. Bowel gas pattern is normal. No acute bone abnormality. Contrast in the renal collecting systems from recent CT scan. IMPRESSION: Feeding tube tip in the third portion of the duodenum in good position. Electronically Signed   By: Francene Boyers M.D.   On: 06/09/2016 16:05      Scheduled Meds: . [START ON 06/13/2016] aspirin  81 mg Oral Daily  . atorvastatin  80 mg Oral q1800  . carvedilol  6.25 mg Oral BID WC  . chlorhexidine  15 mL Mouth Rinse BID  . docusate  100 mg Per Tube BID  . famotidine  20 mg Oral Daily  . feeding supplement (PRO-STAT SUGAR FREE 64)  30 mL Per Tube Daily  . free water  200 mL Per Tube QID  . heparin  5,000 Units Subcutaneous Q8H  . insulin aspart  0-15 Units Subcutaneous Q4H  . insulin aspart  4 Units Subcutaneous TID WC  . insulin glargine  20 Units Subcutaneous QHS  . levETIRAcetam  1,000 mg Intravenous Q12H  . lisinopril  5 mg Oral Daily  . loperamide  2 mg Oral BID  . mouth rinse  15 mL Mouth Rinse q12n4p  . multivitamin with minerals  1 tablet Oral Daily  . torsemide  20 mg Oral Daily   Continuous Infusions: . feeding supplement (OSMOLITE 1.2 CAL) 1,000 mL (06/10/16 1414)     LOS: 19 days    Time spent: 35 minutes  Greater than 50% of the time spent on counseling and coordinating the care.   Zannie Cove, MD [facility] Pager [phone]  If 7PM-7AM, please contact night-coverage [email or web address] Password [email or web address] 06/12/2016, 1:59 PM

## 2016-06-12 NOTE — Progress Notes (Signed)
Progress Note  Patient Name: Douglas Carr Date of Encounter: 06/12/2016  Primary Cardiologist: Allyson Sabal  Subjective   Pt is making slow progress. Still have significant vocal cord edema  Plan is to reassess his vocal cords / swallowing study and see if he is ready for SNF on Monday or Tuesday . If not, plan is for PEG tube   Inpatient Medications    Scheduled Meds: . aspirin  81 mg Per Tube Daily  . atorvastatin  80 mg Per Tube q1800  . carvedilol  6.25 mg Oral BID WC  . chlorhexidine  15 mL Mouth Rinse BID  . docusate  100 mg Per Tube BID  . famotidine  20 mg Oral Daily  . feeding supplement (PRO-STAT SUGAR FREE 64)  30 mL Per Tube Daily  . free water  200 mL Per Tube QID  . heparin  5,000 Units Subcutaneous Q8H  . insulin aspart  0-15 Units Subcutaneous Q4H  . insulin aspart  4 Units Subcutaneous TID WC  . insulin glargine  20 Units Subcutaneous QHS  . levETIRAcetam  1,000 mg Intravenous Q12H  . lisinopril  5 mg Oral Daily  . mouth rinse  15 mL Mouth Rinse q12n4p  . multivitamin with minerals  1 tablet Oral Daily  . torsemide  20 mg Oral Daily   Continuous Infusions: . feeding supplement (OSMOLITE 1.2 CAL) 1,000 mL (06/10/16 1414)   PRN Meds: acetaminophen, acetaminophen, ondansetron (ZOFRAN) IV, pneumococcal 23 valent vaccine   Vital Signs    Vitals:   06/11/16 2342 06/11/16 2352 06/12/16 0410 06/12/16 0737  BP: (!) 99/54 (!) 90/57 (!) 109/51 100/79  Pulse:  70 71 62  Resp: 11   16  Temp: 97.5 F (36.4 C)  98.4 F (36.9 C) 98.4 F (36.9 C)  TempSrc: Axillary  Oral Oral  SpO2:  95% 98% 98%  Weight:   204 lb 14.4 oz (92.9 kg)   Height:        Intake/Output Summary (Last 24 hours) at 06/12/16 0931 Last data filed at 06/12/16 0400  Gross per 24 hour  Intake              220 ml  Output              900 ml  Net             -680 ml   Filed Weights   06/10/16 0447 06/11/16 0458 06/12/16 0410  Weight: 216 lb 11.2 oz (98.3 kg) 205 lb (93 kg) 204 lb  14.4 oz (92.9 kg)    Telemetry    NSR with rates in the 80's, no ectopy- Personally Reviewed  ECG    05/25/2016, NSR, extensive old anterior MI, lateral ST-T changes - Personally Reviewed  Physical Exam  Alert, only able to speak in a hoarse voice, very short usually monosyllabic utterances, but these appear to be appropriate and he follows commands GEN: No acute distress.   Neck: No JVD Cardiac: RRR, no murmurs, rubs, or gallops.  Respiratory: Clear to auscultation bilaterally except for scattered rhonchi, cough noted GI: Soft, nontender, non-distended, feeding tube in place via nose MS: No edema; No deformity. Neuro:   muscle weakness and ataxia Psych: Normal affect   Labs    Chemistry  Recent Labs Lab 06/08/16 0846 06/10/16 0423 06/12/16 0407  NA 146* 143 143  K 3.5 4.1 4.1  CL 109 109 107  CO2 25 23 27   GLUCOSE 216* 284* 217*  BUN  12 18 13   CREATININE 0.80 0.96 0.89  CALCIUM 7.8* 8.0* 8.4*  GFRNONAA >60 >60 >60  GFRAA >60 >60 >60  ANIONGAP 12 11 9      Hematology  Recent Labs Lab 06/06/16 0544 06/10/16 0423 06/12/16 0407  WBC 10.5 9.3 9.1  RBC 4.32 4.13* 3.97*  HGB 11.9* 11.0* 10.7*  HCT 38.0* 36.2* 34.7*  MCV 88.0 87.7 87.4  MCH 27.5 26.6 27.0  MCHC 31.3 30.4 30.8  RDW 12.9 13.2 13.5  PLT 226 300 266     Radiology    No results found.  Cardiac Studies   Cardiac Cath: 05/07/16   LV end diastolic pressure is severely elevated.  Ost LAD to Prox LAD lesion, 30 %stenosed.  Ost 2nd Diag to 2nd Diag lesion, 50 %stenosed.  Ost 1st Diag to 1st Diag lesion, 70 %stenosed.  Ost Cx to Prox Cx lesion, 50 %stenosed.  Ost 1st Mrg to 1st Mrg lesion, 35 %stenosed.  2nd Mrg lesion, 90 %stenosed.  A STENT PROMUS PREM MR 2.5X24 drug eluting stent was successfully placed.  Post intervention, there is a 0% residual stenosis.  Prox RCA to Mid RCA lesion, 50 %stenosed.  RPDA lesion, 100 %stenosed.  1. Diffuse coronary artery disease with  relatively small diabetic vessels.  2. Severe segmental stenosis in the second OM. Lesion is hazy and ulcerative. This is the culprit lesion 3. Severely elevated LVEDP 4. Successful stenting of the second OM with DES.  Plan: DAPT for one year. Aggressive risk factor modification. Will treat with IV lasix for elevated LVEDP. Add ARB, beta blocker as tolerated. Assess LV function with Echo. Trend serial troponins and ECG.      Cath 05/24/16  Ost Cx to Mid Cx lesion, 70 %stenosed leading into OM2 as the main trunk of the Circumflex.  Ost 2nd Mrg to 2nd Mrg recent Promus DES 2.5 x 24 stent, - focal 90 %stenosed with what appears to be proximal edge dissection with thrombus  A STENT PROMUS PREM MR 2.5X20 drug eluting stent was successfully placed from Ostium of Circumflex into OM2, and overlaps previously placed stent.  Post intervention, there is a 0% residual stenosis.  ____________________________________________________  Suezanne Jacquet LAD to Prox LAD lesion, 30 %stenosed.  Ost 1st Diag to 1st Diag lesion, 70 %stenosed.  Ost 1st Mrg to 1st Mrg lesion, 35 %stenosed. Ost 2nd Diag to 2nd Diag lesion, 50 %stenosed.  Prox RCA to Mid RCA lesion, 65 %stenosed - lesion appears similar to prior cath, with mild progression.  Very distal RPDA lesion, 100 %stenosed.  LV end diastolic pressure is severely elevated.  There is no aortic valve stenosis.   Status post what is most likely VF or VT arrest 2 weeks post STEMI with PCI to the circumflex.  His EKG itself did not show signs of ST elevation, however given his recent STEMI, known cardiac myopathy and presentation with cardiac arrest, I felt it prudent taken to the Cath Lab.  Angiographically there appeared to be a possible proximal edge dissection with thrombus in this lesion, however there is TIMI 2 flow. This lesion was treated with an overlapping proximal stent up to the ostium of the circumflex and OM 2. Angiographically there was good  result. It is quite possible at least 1 and strut is into the left main, but non flow-limiting.  Plan:  He'll be admitted to the CCU on hypothermia protocol per PCCM  Continue amiodarone overnight as he appears to have converted into sinus rhythm  Hold ARB  and Imdur for now. We'll try to start beta blocker.  Cardiology will follow along while on cooling protocol.  ECHO 05/15/16 - Left ventricle: The cavity size was moderately dilated. Wall thickness was normal. Systolic function was severely reduced. The estimated ejection fraction was in the range of 25% to 30%. Akinesis and scarring of the anteroseptal and anterior myocardium; consistent with infarction in the distribution of the left anterior descending coronary artery. Dyskinesis of the apicalanterior myocardium. Features are consistent with a pseudonormal left ventricular filling pattern, with concomitant abnormal relaxation and increased filling pressure (grade 2 diastolic dysfunction). No evidence of thrombus. - Left atrium: The atrium was mildly dilated. - Atrial septum: No defect or patent foramen ovale was identified.    Patient Profile     51 y.o. male very early onset coronary artery disease and severe ischemic cardiomyopathy, presenting with quickly resuscitated VT/VF cardiac arrest and requiring repeat PCI to left circumflex roughly 2 weeks after initial presentation with STEMI.  CPR was started within 1 minute of loss of consciousness. He received hypothermia protocol. He appears to have fairly severe neurological sequelae, but is improving on a daily basis.  Assessment & Plan    1. Cardiac arrest:  -S/P STEMI with DES to OM2 05/07/2016 -on 05/24/16 Found by family pulseless and CPR inititated wothin about a minute. CPR for about 10 minutes. AED shock in the field. Cardiac arrest was thought to VT/VF. Pt had recent STEMI and dilated CM. He was taken back tot he cath lab and found to have  possible edge dissection with thrombus in the circ, treated with overlapping DES. He was placed on cooling protocol.  -No ekg STEMI, Troponin mas 0.48 -It is felt that the arrest was more related to VT/VF than to subsequent MI and a Life Vest has been planned. The patient is a candidate for defibrillator implant without waiting the 90 days post event, however, with his neurologic status, he is not yet ready to undergo defibrillator placement.   Plan is for LifeVest on MOnday   2. Anoxic encephalopathy/ injury:  Slow improvement  Dr. Jomarie LongsJoseph is now assuming care for the patient and is working through these issues.   3. CAD:  -Early onset with myocardial infarction at age 51 (1986), managed medically due to collateral formation; severe ischemic cardiomyopathy due to diffuse coronary disease, then STEMI of left circumflex coronary artery in January 2018, with recurrent revascularization following the cardiac arrest. Would keep on lifelong dual antiplatelet therapy  4. CHF, systolic:  -EF 25-30% per echo on 05/15/2016 -Appears euvolemic, Intake was increased with tube feedings and he is up 456 ml for last 24h. Weight appears to be down or at least stable (down 40 pounds this admission) -Diuretic has been restarted. Electrolytes and kidney function stable.      Kristeen MissPhilip Osmond Steckman, MD  06/12/2016 10:22 AM    [facility] [address],  [address] [place], [place]  [address] Pager [phone] Phone: [phone]; Fax: [phone]

## 2016-06-12 NOTE — Evaluation (Signed)
Speech Language Pathology Evaluation Patient Details Name: Margarita SermonsClifford M Toops MRN: [id number] DOB: 1965-08-09 Today's Date: 06/12/2016 Time: [id number] SLP Time Calculation (min) (ACUTE ONLY): 35 min  Problem List:  Patient Active Problem List   Diagnosis Date Noted  . MSSA (methicillin susceptible Staphylococcus aureus) infection   . Diabetes mellitus with complication (HCC)   . Anoxia   . Diabetes mellitus type 2 in obese (HCC)   . Aspiration pneumonia (HCC)   . Acute on chronic combined systolic and diastolic CHF (congestive heart failure) (HCC)   . Fever   . Hypernatremia   . Leukocytosis   . Acute blood loss anemia   . Myoclonus   . Acute respiratory failure with hypoxia (HCC) 05/30/2016  . Ventilator dependent (HCC)   . Palliative care by specialist   . Encounter for hospice care discussion   . Anoxic brain injury (HCC)   . Anoxic encephalopathy (HCC)   . Seizures (HCC)   . Goals of care, counseling/discussion   . Palliative care encounter   . Central venous catheter in place   . Cardiac arrest (HCC) 05/25/2016  . Acute encephalopathy   . Cardiac arrest with ventricular fibrillation (HCC) 05/24/2016  . Dilated cardiomyopathy (HCC) 05/24/2016  . Coronary artery disease involving native coronary artery of native heart with angina pectoris (HCC) 05/24/2016  . ST elevation myocardial infarction (STEMI) (HCC)   . 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 Status post coronary artery stent placement   . Acute on chronic systolic heart failure (HCC)   . Acute ST elevation myocardial infarction (STEMI) involving left circumflex coronary artery (HCC) 05/07/2016  . Essential hypertension 09/21/2013  . Diabetes mellitus (HCC) 09/21/2013  . Combined hyperlipidemia associated with type 2 diabetes mellitus 09/21/2013  . Pulmonary nodules 10/16/2011   Past Medical History:  Past Medical History:  Diagnosis Date  . Acute ST  elevation myocardial infarction (STEMI) involving left circumflex coronary artery (HCC) 05/07/2016   PCI to Cx-OM  . Anginal pain (HCC)    secondary to sm. vessel disease  . Anxiety   . Arthritis    BIL KNEE PAIN AND BIL ANKLE PAIN  . Bone spur of ankle   . Cardiac arrest (HCC) 05/24/2016   with v fib  . 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 Coronary artery disease involving native coronary artery of native heart with angina pectoris (HCC) 05/24/2016   Remote MI at 51 years of age, Last cath 2010-diffuse non-obstructive disease; last echo 06/16/08 -normal LV function, moderate concentric hypertrophy; nuc 08/2008 no ischemia;  medical therapy; STEMI May 07 2016 - PCI to Cx-OM  . Diabetes mellitus    ON ORAL MEDICATION AND INSULIN  . Dilated cardiomyopathy (HCC) 05/24/2016   EF 325-30% by Echo post STEMI (previously 30-35%)   . Hyperlipidemia   . Hypertension   . Myocardial infarction 1996   Post MI  . Peripheral vascular disease (HCC)    HAS LEFT CAROTID ARTERY STENOSIS   AND IS S/P RIGHT CAROTID ENDARTERECTOMY 2010 Last carotid dopplers 01/08/2012 wth patent endarterectomy site  . Status post coronary artery stent placement    Past Surgical History:  Past Surgical History:  Procedure Laterality Date  . APPENDECTOMY    . CARDIAC CATHETERIZATION     FEB 2010, significant branch vessel disease wth diag, marginal, PDA & PLA, nml. LV function  . CARDIAC CATHETERIZATION N/A 05/07/2016  Procedure: Left Heart Cath and Coronary Angiography;  Surgeon: Peter M Swaziland, MD;  Location: [facility] INVASIVE CV LAB;  Service: Cardiovascular;  Laterality: N/A;  . CARDIAC CATHETERIZATION N/A 05/07/2016   Procedure: Coronary Stent Intervention;  Surgeon: Peter M Swaziland, MD;  Location: [facility] INVASIVE CV LAB;  Service: Cardiovascular;  Laterality: N/A;  . CARDIAC CATHETERIZATION N/A 05/24/2016   Procedure: Left Heart Cath and Coronary Angiography;  Surgeon: Marykay Lex, MD;  Location: [facility] INVASIVE CV LAB;  Service: Cardiovascular;  Laterality: N/A;  . CARDIAC CATHETERIZATION N/A 05/24/2016   Procedure: Coronary Stent Intervention;  Surgeon: Marykay Lex, MD;  Location: [facility] INVASIVE CV LAB;  Service: Cardiovascular;  Laterality: N/A;  2.5x20 Promus to Ostial/proximal circumflex  . CAROTID ENDARTERECTOMY  09/2008   Rt CEA  . LUNG BIOPSY  2013   Bx's suggest granulomatous dz.  . RT ANKLE   2013   HPI:  Mr. Vanhorn is a 51 yo male with PMH of CAD(recently admitted from 1/3-1/7 for MI, had PCI of Om2), HFrEF (EF 35%), DMII (on insulin), HLD and carotid disease. He was discharged home from that admission, but returned shortly there after with 8lb weight gain, worsening dyspnea and decreased UOP despite full compliance with his outpatient diuretic. His BNP was 610 and weight was increased from 239-246lbs. Admitted with severe ischemic cardiomyopathy, presenting with quickly resuscitated VT/VF cardiac arrest and requiring repeat PCI to left circumflex roughly 2 weeks after initial presentation with STEMI. Anoxic brain injury: MRI 06/02/16 showed Stable left thalamus 4 mm acute infarction. Stable few scattered chronic cortical infarcts over the convexities bilaterally.  ETT 1/20-1/28; 1/29-1/31.  Traumatic intubation during code.   Assessment / Plan / Recommendation Clinical Impression  Pt presents with cognitive deficits s/p CVA and anoxic injury, scoring 19/30 per the [facility].  Deficits are specific to selective attention, working memory, Cytogeneticist.  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  He appeared to have some emerging insight today; he was appropriately emotional re: results of cognitive assessment, but also acknowledged the necessity of working hard during his recovery.  Recommend SLP tx to address cognition in conjunction with dysphagia.   Pt/wife agree.     SLP Assessment  Patient needs continued Speech Lanaguage Pathology Services    Follow Up Recommendations  Inpatient Rehab;Other (comment) (continue to recommend given pt's youth, high level of independence and responsibility PTA, motivation to improve)    Frequency and Duration min 3x week  2 weeks      SLP Evaluation Cognition  Overall Cognitive Status: Impaired/Different from baseline Arousal/Alertness: Awake/alert Orientation Level: Oriented to time;Oriented to person;Disoriented to place Attention: Selective Selective Attention: Impaired Selective Attention Impairment: Verbal basic;Verbal complex;Functional basic Memory: Impaired Memory Impairment: Storage deficit;Retrieval deficit Awareness: Impaired Problem Solving: Impaired Executive Function: Reasoning Reasoning: Impaired Reasoning Impairment: Verbal basic Behaviors: Impulsive;Poor frustration tolerance Safety/Judgment: Impaired       Comprehension  Visual Recognition/Discrimination Discrimination: Within Function Limits Reading Comprehension Reading Status: Not tested    Expression Written Expression Dominant Hand: Right   Oral / Motor  Oral Motor/Sensory Function Overall Oral Motor/Sensory Function: Mild impairment Facial Symmetry: Abnormal symmetry right Motor Speech Overall Motor Speech: Impaired Respiration: Within functional limits Phonation: Aphonic Articulation: Impaired   GO                    Chaz Ronning, Aris Everts  06/12/2016, 4:49 PM

## 2016-06-12 NOTE — Progress Notes (Signed)
Speech Language Pathology Treatment: Dysphagia  Patient Details Name: Douglas SermonsClifford M Carr MRN: [id number] DOB: [birth year] Today's Date: 06/12/2016 Time: 1435-1450 SLP Time Calculation (min) (ACUTE ONLY): 15 min  Assessment / Plan / Recommendation Clinical Impression  F/u after yesterday's FEES.  Wife present.  Reviewed results and etiology of dysphagia, particularly related to laryngeal trauma.  Pt consumed 4 oz of pudding with min cues for effortful swallow.  There were no overt s/s of aspiration during PO trials. Voice is marginally stronger today.  Continue limited purees with staff or family.  Plan to repeat FEES several days, anticipating continued reduction in laryngeal edema and improved swallow. Pt/wife agree with plan.  Still awaiting Cortrak.    HPI HPI: Douglas Carr is a 51 yo male with PMH of CAD(recently admitted from 1/3-1/7 for MI, had PCI of Om2), HFrEF (EF 35%), DMII (on insulin), HLD and carotid disease. He was discharged home from that admission, but returned shortly there after with 8lb weight gain, worsening dyspnea and decreased UOP despite full compliance with his outpatient diuretic. His BNP was 610 and weight was increased from 239-246lbs. Admitted with severe ischemic cardiomyopathy, presenting with quickly resuscitated VT/VF cardiac arrest and requiring repeat PCI to left circumflex roughly 2 weeks after initial presentation with STEMI. Anoxic brain injury: MRI 06/02/16 showed Stable left thalamus 4 mm acute infarction. Stable few scattered chronic cortical infarcts over the convexities bilaterally.  ETT 1/20-1/28; 1/29-1/31.  Traumatic intubation during code.      SLP Plan  Continue with current plan of care     Recommendations  Diet recommendations: Other(comment) (ice chips after oral care; 4 oz purees 3x/day) Medication Administration: Via alternative means                Oral Care Recommendations: Oral care QID;Oral care prior to ice chip/H20 Plan: Continue  with current plan of care       GO                [name], Devyn Griffing Laurice 06/12/2016, 4:22 PM

## 2016-06-13 ENCOUNTER — Inpatient Hospital Stay (HOSPITAL_COMMUNITY): Payer: BC Managed Care – PPO

## 2016-06-13 LAB — GLUCOSE, CAPILLARY
GLUCOSE-CAPILLARY: 175 mg/dL — AB (ref 65–99)
GLUCOSE-CAPILLARY: 131 mg/dL — AB (ref 65–99)
Glucose-Capillary: 119 mg/dL — ABNORMAL HIGH (ref 65–99)
Glucose-Capillary: 129 mg/dL — ABNORMAL HIGH (ref 65–99)
Glucose-Capillary: 148 mg/dL — ABNORMAL HIGH (ref 65–99)
Glucose-Capillary: 176 mg/dL — ABNORMAL HIGH (ref 65–99)

## 2016-06-13 MED ORDER — LEVETIRACETAM 100 MG/ML PO SOLN
1000.0000 mg | Freq: Two times a day (BID) | ORAL | Status: DC
  Administered 2016-06-13 – 2016-06-17 (×8): 1000 mg via ORAL
  Filled 2016-06-13 (×9): qty 10

## 2016-06-13 MED ORDER — TAMSULOSIN HCL 0.4 MG PO CAPS
0.4000 mg | ORAL_CAPSULE | Freq: Every day | ORAL | Status: DC
  Administered 2016-06-13 – 2016-06-17 (×5): 0.4 mg via ORAL
  Filled 2016-06-13 (×6): qty 1

## 2016-06-13 MED ORDER — GLUCERNA 1.2 CAL PO LIQD
1000.0000 mL | ORAL | Status: DC
  Filled 2016-06-13: qty 1000

## 2016-06-13 MED ORDER — GLUCERNA 1.2 CAL PO LIQD
1000.0000 mL | ORAL | Status: DC
  Administered 2016-06-14 – 2016-06-16 (×4): 1000 mL
  Filled 2016-06-13 (×8): qty 1000

## 2016-06-13 NOTE — Progress Notes (Signed)
Progress Note  Patient Name: Douglas HUGHETT Date of Encounter: 06/13/2016  Primary Cardiologist: Allyson Sabal  Subjective   51 yo WM with history of vasculopathy. Known diffuse CAD by cardiac cath in 2010. S/p right CEA in 2010. Presents to ED tonight with worsening chest pain and SOB. States pain began 2 days ago. Described as mid sternal chest pain radiating into his back. Associated with SOB. Last night also had diaphoresis. Tonight dyspnea became worse and he presented to ED. Ecg showed evidence of lateral STEMI. No prior history of MI. History of poorly controlled DM on insulin. History of HTN, mixed hyperlipidemia and family history of CAD    Inpatient Medications    Scheduled Meds: . aspirin  81 mg Oral Daily  . atorvastatin  80 mg Oral q1800  . carvedilol  6.25 mg Oral BID WC  . chlorhexidine  15 mL Mouth Rinse BID  . docusate  100 mg Per Tube BID  . famotidine  20 mg Oral Daily  . free water  200 mL Per Tube QID  . heparin  5,000 Units Subcutaneous Q8H  . insulin aspart  0-15 Units Subcutaneous Q4H  . insulin aspart  4 Units Subcutaneous TID WC  . insulin glargine  20 Units Subcutaneous QHS  . levETIRAcetam  1,000 mg Intravenous Q12H  . lisinopril  5 mg Oral Daily  . loperamide  2 mg Oral BID  . mouth rinse  15 mL Mouth Rinse q12n4p  . multivitamin with minerals  1 tablet Oral Daily  . tamsulosin  0.4 mg Oral Daily  . torsemide  20 mg Oral Daily   Continuous Infusions:  PRN Meds: acetaminophen, acetaminophen, ondansetron (ZOFRAN) IV, pneumococcal 23 valent vaccine   Vital Signs    Vitals:   06/12/16 2015 06/13/16 0041 06/13/16 0500 06/13/16 0726  BP: (!) 100/52 (!) 90/50  118/63  Pulse: 80 78  81  Resp: 10 15  14   Temp: 98.6 F (37 C) 97.8 F (36.6 C)  98.1 F (36.7 C)  TempSrc: Axillary Axillary  Oral  SpO2: 97% 99%  99%  Weight:   203 lb 3.2 oz (92.2 kg)   Height:        Intake/Output Summary (Last 24 hours) at 06/13/16 1002 Last data filed at  06/13/16 0600  Gross per 24 hour  Intake              160 ml  Output             2200 ml  Net            -2040 ml   Filed Weights   06/11/16 0458 06/12/16 0410 06/13/16 0500  Weight: 205 lb (93 kg) 204 lb 14.4 oz (92.9 kg) 203 lb 3.2 oz (92.2 kg)    Telemetry    NSR  - Personally Reviewed  ECG      Physical Exam   GEN: No acute distress.   Voice is very raspy  Neck: No JVD Cardiac: RRR, no murmurs, rubs, or gallops.  Respiratory: Clear to auscultation bilaterally. GI: Soft, nontender, non-distended  MS: No edema; No deformity. Neuro:  Nonfocal   Psych: Normal affect   Labs    Chemistry Recent Labs Lab 06/08/16 0846 06/10/16 0423 06/12/16 0407  NA 146* 143 143  K 3.5 4.1 4.1  CL 109 109 107  CO2 25 23 27   GLUCOSE 216* 284* 217*  BUN 12 18 13   CREATININE 0.80 0.96 0.89  CALCIUM 7.8* 8.0*  8.4*  GFRNONAA >60 >60 >60  GFRAA >60 >60 >60  ANIONGAP 12 11 9      Hematology Recent Labs Lab 06/10/16 0423 06/12/16 0407  WBC 9.3 9.1  RBC 4.13* 3.97*  HGB 11.0* 10.7*  HCT 36.2* 34.7*  MCV 87.7 87.4  MCH 26.6 27.0  MCHC 30.4 30.8  RDW 13.2 13.5  PLT 300 266    Cardiac EnzymesNo results for input(s): TROPONINI in the last 168 hours. No results for input(s): TROPIPOC in the last 168 hours.   BNPNo results for input(s): BNP, PROBNP in the last 168 hours.   DDimer No results for input(s): DDIMER in the last 168 hours.   Radiology    Ct Abdomen Wo Contrast  Result Date: 06/12/2016 CLINICAL DATA:  Dysphagia. Please evaluate anatomy for potential percutaneous gastrostomy tube placement. EXAM: CT ABDOMEN WITHOUT CONTRAST TECHNIQUE: Multidetector CT imaging of the abdomen was performed following the standard protocol without IV contrast. COMPARISON:  None. FINDINGS: Lower chest: Limited visualization of lower thorax demonstrates minimal dependent subpleural ground-glass atelectasis. No focal airspace opacities. No pleural effusion. Normal heart size.  No pericardial  effusion. Hepatobiliary: Normal hepatic contour. Layering high attenuation debris is suspected within the gallbladder potentially indicative of biliary sludge. No definite radiopaque gallstones. No definitive pericholecystic stranding on this noncontrast examination. Pancreas: Normal noncontrast appearance of the pancreas Spleen: Punctate granuloma within an otherwise normal-appearing spleen. Adrenals/Urinary Tract: No renal stones. Minimal amount of symmetric bilateral perinephric stranding. No urine obstruction. Normal noncontrast appearance the bilateral adrenal glands. Stomach/Bowel: There is an adequate percutaneous window to the ventral aspect of the gastric antrum without interposed colon or liver. Enteric contrast is seen throughout the colon. Rectal tube appropriately position. No evidence of enteric obstruction. No pneumoperitoneum, pneumatosis or portal venous gas. Vascular/Lymphatic: Moderate amount of the centric calcified atherosclerotic plaque within the abdominal aorta and pelvic vasculature. No bulky retroperitoneal, mesenteric, pelvic or inguinal lymphadenopathy. Other: Subcutaneous stranding about the under surface of the abdominal pannus, likely a sequela of subcutaneous medication administration. Small umbilical mesenteric fat containing hernia. Musculoskeletal: No acute or aggressive osseous abnormalities. Stigmata of DISH within the thoracic spine. Moderate DDD of L5-S1 with disc space height loss, endplate irregularity and sclerosis. IMPRESSION: 1. Gastric anatomy amenable to potential percutaneous gastrostomy tube placement. 2. Appropriate positioned rectal tube. No evidence of enteric obstruction. 3. Potential biliary sludge. Electronically Signed   By: Simonne Come M.D.   On: 06/12/2016 09:48    Cardiac Studies     Patient Profile     51 y.o. males/p cardiac arrest.  Slow progress.    Assessment & Plan    1. Cardiac arrest:  -S/P STEMI with DES to OM2 05/07/2016 -on 05/24/16  Found by family pulseless and CPR inititated wothin about a minute. CPR for about 10 minutes. AED shock in the field. Cardiac arrest was thought to VT/VF. Pt had recent STEMI and dilated CM. He was taken back tot he cath lab and found to have possible edge dissection with thrombus in the circ, treated with overlapping DES. He was placed on cooling protocol.  -No ekg STEMI, Troponin mas 0.48 -It is felt that the arrest was more related to VT/VF than to subsequent MI and a Life Vest has been planned. The patient is a candidate for defibrillator implant without waiting the 90 days post event, however, with his neurologic status, he is not yet ready to undergo defibrillator placement.  Speech pathology is following his vocal cord edema and dysfunction   Plan  is for LifeVest on MOnday   2. Anoxicencephalopathy/injury: Slow improvement  Dr. Jomarie LongsJoseph is now assuming care for the patient and is working through these issues.   3. CAD:  -Early onset with myocardial infarction at age 51 (1986), managed medically due to collateral formation; severe ischemic cardiomyopathy due to diffuse coronary disease, then STEMI of left circumflex coronary artery in January 2018, with recurrent revascularization following the cardiac arrest. Would keep on lifelong dual antiplatelet therapy  4. CHF, systolic:  -EF 25-30% per echo on 05/15/2016 -Appears euvolemic, Intake was increased with tube feedings and he is up 456 ml for last 24h. Weight appears to be down or at least stable (down 40 pounds this admission) -Diuretic has been restarted. Electrolytes and kidney function stable.         Signed, Kristeen MissPhilip Anniemae Haberkorn, MD  06/13/2016, 10:02 AM

## 2016-06-13 NOTE — Clinical Social Work Note (Signed)
Bed offers given to the patient and wife. At this time the [place]. Wife is wondering if inpatient rehab can be looked at again if the patient make more improvement over the weekend. She expresses interest in [facility] inpatient rehab and our CIR. CSW explained that at this time SNF remains the recommendation, but if this changes CIR is not necessarily off the table. CSW encouraged the wife to have a SNF option chosen by Monday so that authorization can be started.   Roddie McBryant Shahed Yeoman MSW, [place] RockLCSW, [place] Community[place], [phone]

## 2016-06-13 NOTE — Clinical Social Work Note (Signed)
Clinical Social Work Assessment  Patient Details  Name: Douglas Carr MRN: [id number] Date of Birth: [birth year]  Date of referral:  06/12/16               Reason for consult:  Facility Placement, Discharge Planning                Permission sought to share information with:  Facility Sport and exercise psychologist, Family Supports Permission granted to share information::  Yes, Verbal Permission Granted  Name::     Adult nurse::  SNFs  Relationship::  Wife  Contact Information:     Housing/Transportation Living arrangements for the past 2 months:  Single Family Home Source of Information:  Patient Patient Interpreter Needed:  None Criminal Activity/Legal Involvement Pertinent to Current Situation/Hospitalization:  No - Comment as needed Significant Relationships:  Spouse, Siblings Lives with:  Spouse Do you feel safe going back to the place where you live?  No Need for family participation in patient care:  Yes (Comment)  Care giving concerns:  The patient's wife is concerned with disposition as she realizes the patient needs extensive rehab and does not have the support needed at home.   Social Worker assessment / plan:  CSW met with the patient and wife at bedside to complete assessment. THe patient's wife shares that the patient will need SNF at discharge. The patient does not really contribute to the assessment. CSW explained barriers to discharge and the difficulties we may have with placement given the patient's need for a lifevest and possible feeding tube if swallow eval is not passed. SNF search/placement process explained and questions answered. The patient's wife voices interest in [place] and [place]. CSW will follow up with bed offers once available.   Employment status:  Therapist, music:  Managed Care PT Recommendations:  Inpatient Rehab Consult Information / Referral to community resources:  [place]  Patient/Family's  Response to care:  The patient's wife is appreciative of CSW's assistance with placement process. The patient's wife is appreciative of the care received.  Patient/Family's Understanding of and Emotional Response to Diagnosis, Current Treatment, and Prognosis:  The patient's wife has a good understanding of the patient's condition and prognosis. She understands that the patient will need extensive rehab.   Emotional Assessment Appearance:  Appears stated age Attitude/Demeanor/Rapport:  Unable to Assess Affect (typically observed):  Unable to Assess Orientation:  Oriented to Self Alcohol / Substance use:  Not Applicable Psych involvement (Current and /or in the community):  No (Comment)  Discharge Needs  Concerns to be addressed:  Care Coordination, Discharge Planning Concerns Readmission within the last 30 days:  Yes Current discharge risk:  Physical Impairment, Cognitively Impaired Barriers to Discharge:  Continued Medical Work up   August, LCSW 06/13/2016, 1:15 PM

## 2016-06-13 NOTE — [place] FL2 (Deleted)
[place] MEDICAID FL2 LEVEL OF CARE SCREENING TOOL     IDENTIFICATION  Patient Name: Douglas SermonsClifford M Hank Birthdate: 05/14/1965 Sex: male Admission Date (Current Location): 05/24/2016  [facility] [place]   Facility and Address:  [facility], [address], [place], [place] [address]      Provider Number: [id number]  Attending Physician Name and Address:  Zannie CovePreetha Perris Conwell, MD  Relative Name and Phone Number:       Current Level of Care: Hospital Recommended Level of Care: Skilled Nursing Facility Prior Approval Number:    Date Approved/Denied:   PASRR Number:    Discharge Plan: SNF    Current Diagnoses: Patient Active Problem List   Diagnosis Date Noted  . MSSA (methicillin susceptible Staphylococcus aureus) infection   . Diabetes mellitus with complication (HCC)   . Anoxia   . Diabetes mellitus type 2 in obese (HCC)   . Aspiration pneumonia (HCC)   . Acute on chronic combined systolic and diastolic CHF (congestive heart failure) (HCC)   . Fever   . Hypernatremia   . Leukocytosis   . Acute blood loss anemia   . Myoclonus   . Acute respiratory failure with hypoxia (HCC) 05/30/2016  . Ventilator dependent (HCC)   . Palliative care by specialist   . Encounter for hospice care discussion   . Anoxic brain injury (HCC)   . Anoxic encephalopathy (HCC)   . Seizures (HCC)   . Goals of care, counseling/discussion   . Palliative care encounter   . Central venous catheter in place   . Cardiac arrest (HCC) 05/25/2016  . Acute encephalopathy   . Cardiac arrest with ventricular fibrillation (HCC) 05/24/2016  . Dilated cardiomyopathy (HCC) 05/24/2016  . Coronary artery disease involving native coronary artery of native heart with angina pectoris (HCC) 05/24/2016  . ST elevation myocardial infarction (STEMI) (HCC)   . 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 Status post  coronary artery stent placement   . Acute on chronic systolic heart failure (HCC)   . Acute ST elevation myocardial infarction (STEMI) involving left circumflex coronary artery (HCC) 05/07/2016  . Essential hypertension 09/21/2013  . Diabetes mellitus (HCC) 09/21/2013  . Combined hyperlipidemia associated with type 2 diabetes mellitus 09/21/2013  . Pulmonary nodules 10/16/2011    Orientation RESPIRATION BLADDER Height & Weight     Self, Situation, Place  Normal Continent Weight: 92.2 kg (203 lb 3.2 oz) Height:  5\' 9"  (175.3 cm)  BEHAVIORAL SYMPTOMS/MOOD NEUROLOGICAL BOWEL NUTRITION STATUS   (NONE)  (NONE) Incontinent Diet (Patient will have swallow evaluation to determine if he can be on a diet or if he will need a PEG tube.)  AMBULATORY STATUS COMMUNICATION OF NEEDS Skin   Extensive Assist Verbally Normal                       Personal Care Assistance Level of Assistance  Bathing, Feeding, Dressing Bathing Assistance: Limited assistance Feeding assistance: Independent Dressing Assistance: Limited assistance     Functional Limitations Info  Sight, Hearing, Speech Sight Info: Adequate Hearing Info: Adequate Speech Info: Adequate    SPECIAL CARE FACTORS FREQUENCY  PT (By licensed PT), OT (By licensed OT), Speech therapy     PT Frequency: 5/week OT Frequency: 5/week     Speech Therapy Frequency: 5/week      Contractures Contractures Info: Not present    Additional Factors Info  Allergies, Insulin Sliding Scale Code Status Info: Full Code Allergies Info: NKDA   Insulin Sliding Scale Info: 6/day       Current Medications (06/13/2016):  This is the current hospital active medication list Current Facility-Administered Medications  Medication Dose Route Frequency Provider Last Rate Last Dose  . acetaminophen (TYLENOL) suppository 650 mg  650 mg Rectal Q6H PRN Zigmund Gottron, MD   650 mg at 05/31/16 1225  . acetaminophen (TYLENOL) tablet 650 mg  650 mg Oral  Q4H PRN Marykay Lex, MD   650 mg at 06/03/16 1744  . aspirin chewable tablet 81 mg  81 mg Oral Daily Zannie Cove, MD      . atorvastatin (LIPITOR) tablet 80 mg  80 mg Oral q1800 Zannie Cove, MD   80 mg at 06/12/16 1807  . carvedilol (COREG) tablet 6.25 mg  6.25 mg Oral BID WC Mihai Croitoru, MD   6.25 mg at 06/13/16 0943  . chlorhexidine (PERIDEX) 0.12 % solution 15 mL  15 mL Mouth Rinse BID Alyson Reedy, MD   15 mL at 06/12/16 2035  . docusate (COLACE) 50 MG/5ML liquid 100 mg  100 mg Per Tube BID Nelda Bucks, MD   100 mg at 06/10/16 0919  . famotidine (PEPCID) 40 MG/5ML suspension 20 mg  20 mg Oral Daily Marykay Lex, MD   20 mg at 06/10/16 0919  . free water 200 mL  200 mL Per Tube QID Thurmon Fair, MD   200 mL at 06/10/16 2139  . heparin injection 5,000 Units  5,000 Units Subcutaneous Q8H Marykay Lex, MD   5,000 Units at 06/13/16 0515  . insulin aspart (novoLOG) injection 0-15 Units  0-15 Units Subcutaneous Q4H Arty Baumgartner, NP   2 Units at 06/13/16 [phone]  . insulin aspart (novoLOG) injection 4 Units  4 Units Subcutaneous TID WC Alison Murray, MD   Stopped at 06/11/16 1715  . insulin glargine (LANTUS) injection 20 Units  20 Units Subcutaneous QHS Zannie Cove, MD   20 Units at 06/12/16 2034  . levETIRAcetam (KEPPRA) 100 MG/ML solution 1,000 mg  1,000 mg Oral BID Zannie Cove, MD      . lisinopril (PRINIVIL,ZESTRIL) tablet 5 mg  5 mg Oral Daily Alyson Reedy, MD   5 mg at 06/12/16 1035  . loperamide (IMODIUM) capsule 2 mg  2 mg Oral BID Zannie Cove, MD   2 mg at 06/13/16 0942  . MEDLINE mouth rinse  15 mL Mouth Rinse q12n4p Alyson Reedy, MD   15 mL at 06/12/16 1600  . multivitamin with minerals tablet 1 tablet  1 tablet Oral Daily Marykay Lex, MD   1 tablet at 06/12/16 1035  . ondansetron (ZOFRAN) injection 4 mg  4 mg Intravenous Q6H PRN Marykay Lex, MD   4 mg at 05/27/16 0345  . pneumococcal 23 valent vaccine (PNU-IMMUNE) injection 0.5 mL  0.5 mL  Intramuscular Prior to discharge Marykay Lex, MD      . tamsulosin [facility]) capsule 0.4 mg  0.4 mg Oral Daily Zannie Cove, MD   0.4 mg at 06/13/16 0941  . torsemide (DEMADEX) tablet 20 mg  20 mg Oral Daily Mihai Croitoru, MD   20 mg at 06/12/16 1035     Discharge Medications: Please see discharge summary for a list of discharge medications.  Relevant Imaging Results:  Relevant Lab Results:   Additional Information SSN:  [id number]  Venita Lick, LCSW

## 2016-06-13 NOTE — Progress Notes (Signed)
Occupational Therapy Treatment Patient Details Name: Douglas Carr MRN: [id number] DOB: 1965/07/03 Today's Date: 06/13/2016    History of present illness Pt is a 50 y/o male admitted secondary to STEMI at home with seizures. He is s/p PCI to circumflex. Pt also with an anoxic brain injury. Of note, pt recently admitted on 05/07/16-05/11/16 for an MI. PMH including but not limited to CHF, HTN, CAD, DM and PVD.   OT comments  Pt making progress with functional goals. OT will continue to follow  Follow Up Recommendations  SNF;Supervision/Assistance - 24 hour    Equipment Recommendations  None recommended by OT (TBD at next venue of care)    Recommendations for Other Services      Precautions / Restrictions Precautions Precautions: Fall Precaution Comments: watch BP Restrictions Weight Bearing Restrictions: No       Mobility Bed Mobility Overal bed mobility: Needs Assistance Bed Mobility: Rolling;Sidelying to Sit;Sit to Sidelying Rolling: Min guard Sidelying to sit: Mod assist;HOB elevated Supine to sit: HOB elevated;Mod assist     General bed mobility comments: pt required cues to slow pace of speed to return to supine  Transfers Overall transfer level: Needs assistance Equipment used: 1 person hand held assist Transfers: Sit to/from [employer] Transfers Sit to Stand: Mod assist Stand pivot transfers: Mod assist       General transfer comment: cues for safety, hand placement. Pt impulsive    Balance   Sitting-balance support: Feet supported;Bilateral upper extremity supported Sitting balance-Leahy Scale: Fair Sitting balance - Comments: required min A - min guard when reachng for otimes and to doff/donn socks   Standing balance support: During functional activity;Bilateral upper extremity supported Standing balance-Leahy Scale: Poor                     ADL Overall ADL's : Needs assistance/impaired     Grooming: Wash/dry face;Sitting;Wash/dry  hands;Min guard   Upper Body Bathing: Sitting;Minimal assistance;Cueing for safety;Cueing for sequencing       Upper Body Dressing : Minimal assistance;Moderate assistance;Sitting;Cueing for safety;Cueing for sequencing   Lower Body Dressing: Moderate assistance Lower Body Dressing Details (indicate cue type and reason): doning socks Toilet Transfer: Moderate assistance;Stand-pivot;Cueing for sequencing;Cueing for safety Toilet Transfer Details (indicate cue type and reason): simulated to recliner                                                  Cognition   Behavior During Therapy: Flat affect Overall Cognitive Status: Impaired/Different from baseline Area of Impairment: Orientation;Attention;Following commands;Problem solving;Safety/judgement        Following Commands: Follows one step commands with increased time Safety/Judgement: Decreased awareness of safety;Decreased awareness of deficits   Problem Solving: Slow processing;Decreased initiation;Difficulty sequencing;Requires verbal cues;Requires tactile cues      Extremity/Trunk Assessment   Generalized weakness            Exercises  ROM B UEs In all planes x 2 sets 15 reps each          General Comments  pt pleasant and cooperative    Pertinent Vitals/ Pain       Pain Assessment: No/denies pain  Frequency  Min 3X/week        Progress Toward Goals  OT Goals(current goals can now be found in the care plan section)  Progress towards OT goals: Progressing toward goals     Plan Discharge plan needs to be updated                     End of Session Equipment Utilized During Treatment: Gait belt   Activity Tolerance Patient tolerated treatment well   Patient Left with bed alarm set;in bed   Nurse Communication          Time: [id number] OT Time Calculation (min): 19 min  Charges: OT  General Charges $OT Visit: 1 Procedure OT Treatments $Therapeutic Activity: 8-22 mins  Galen ManilaSpencer, Matvey Llanas Jeanette 06/13/2016, 2:06 PM

## 2016-06-13 NOTE — Progress Notes (Addendum)
Nutrition Follow-up  DOCUMENTATION CODES:   Obesity unspecified  INTERVENTION:   Glucerna 1.2 at 75 ml/h (1800 ml per day)  Provides 2160 kcal, 108 gm protein, 1449 ml free water daily  Free water flushes 200 ml QID (total free water intake of 2249 ml)  NUTRITION DIAGNOSIS:   Inadequate oral intake related to inability to eat as evidenced by NPO status.  Ongoing  GOAL:   Patient will meet greater than or equal to 90% of their needs  Unmet  MONITOR:   TF tolerance, Diet advancement, I & O's, Labs  ASSESSMENT:   51 yo male with PMH of CAD(recently admitted from 1/3-1/527for MI, had PCI of Om2), HFrEF (EF 35%), DMII (on insulin), HLD and carotid disease. Pt admitted with OOH arrest, now with onset seizures.   SLP continues to follow patient for dysphagia. Patient is now being allowed 4 ounces of pureed foods TID. Diet order remains NPO. Plans for repeat FEES on 2/12. Cortrak feeding tube was pulled out by patient on 2/7, replaced today with tip in the proximal duodenum.  Stools remain loose. Rectal tube in place.  Received page from RN requesting RD to order TF.  Diet Order:   NPO  Skin:  Reviewed, no issues  Last BM:  2/8 (loose)  Height:   Ht Readings from Last 1 Encounters:  05/24/16 5\' 9"  (1.753 m)    Weight:   Wt Readings from Last 1 Encounters:  06/13/16 203 lb 3.2 oz (92.2 kg)   06/12/16 204 lb 14.4 oz (92.9 kg)   06/09/16 206 lb (93.4 kg)   06/05/16 227 lb 11.8 oz (103.3 kg)    Ideal Body Weight:  72.7 kg  BMI:  Body mass index is 30.01 kg/m.  Estimated Nutritional Needs:   Kcal:  2000-2200  Protein:  105-115 grams  Fluid:  2 L/day  EDUCATION NEEDS:   No education needs identified at this time  Joaquin CourtsKimberly Haneef Hallquist, RD, LDN, CNSC Pager [phone] After Hours Pager [phone]

## 2016-06-13 NOTE — Progress Notes (Signed)
Speech Language Pathology Treatment: Dysphagia;Cognitive-Linquistic  Patient Details Name: Douglas Carr MRN: [id number] DOB: [birth year] Today's Date: 06/13/2016 Time: [id number] SLP Time Calculation (min) (ACUTE ONLY): 39 min  Assessment / Plan / Recommendation Clinical Impression  Pt able to self-feed purees with no physical assist; min cues for hard cough and effortful swallow.  Phonation quality remains similar to yesterday.  Pt recalled results of yesterday's cognitive testing.  Engaged in divergent naming tasks with spontaneous naming of 7 items out of ten; occasional repetitions.  Used visual linking to recall three words, naming 2/3 independently on three trials.  Beginning to show fleeting insight into deficits, but continues with impulsivity and poor judgment (got out of chair and into bed without requesting staff help last night).  Continue SLP for cognition and swallowing.     HPI HPI: Douglas Carr is a 51 yo male with PMH of CAD(recently admitted from 1/3-1/7 for MI, had PCI of Om2), HFrEF (EF 35%), DMII (on insulin), HLD and carotid disease. He was discharged home from that admission, but returned shortly there after with 8lb weight gain, worsening dyspnea and decreased UOP despite full compliance with his outpatient diuretic. His BNP was 610 and weight was increased from 239-246lbs. Admitted with severe ischemic cardiomyopathy, presenting with quickly resuscitated VT/VF cardiac arrest and requiring repeat PCI to left circumflex roughly 2 weeks after initial presentation with STEMI. Anoxic brain injury: MRI 06/02/16 showed Stable left thalamus 4 mm acute infarction. Stable few scattered chronic cortical infarcts over the convexities bilaterally.  ETT 1/20-1/28; 1/29-1/31.  Traumatic intubation during code.      SLP Plan  Continue with current plan of care  Repeat FEES on Monday, 2/12.    Recommendations  Diet recommendations: Other(comment) (may have 4 oz of purees, three  x/day) Medication Administration: Crushed with puree Supervision: Patient able to self feed Compensations: Minimize environmental distractions;Slow rate;Effortful swallow;Hard cough after swallow Postural Changes and/or Swallow Maneuvers: Seated upright 90 degrees                Plan: Continue with current plan of care       GO                [name], Lynnsey Barbara Laurice 06/13/2016, 10:20 AM

## 2016-06-13 NOTE — Progress Notes (Signed)
Physical Therapy Treatment Patient Details Name: Douglas SermonsClifford M Schnitker MRN: [id number] DOB: 01-11-1966 Today's Date: 06/13/2016    History of Present Illness Pt is a 51 y/o male admitted secondary to STEMI at home with seizures. He is s/p PCI to circumflex. Pt also with an anoxic brain injury. Of note, pt recently admitted on 05/07/16-05/11/16 for an MI. PMH including but not limited to CHF, HTN, CAD, DM and PVD.    PT Comments    Patient limited by low BP and lethargy this session (right after cortrack placed).  Feel he may need medication adjustment to allow improved tolerance to therapy for ambulation and standing.  PT to follow acutely.  Continue to recommend SNF rehab at d/c.   Follow Up Recommendations  SNF     Equipment Recommendations  Other (comment) (defer to next venue)    Recommendations for Other Services       Precautions / Restrictions Precautions Precautions: Fall Precaution Comments: watch BP Restrictions Weight Bearing Restrictions: No    Mobility  Bed Mobility Overal bed mobility: Needs Assistance Bed Mobility: Rolling;Sidelying to Sit Rolling: Supervision Sidelying to sit: Mod assist Supine to sit: Mod assist     General bed mobility comments: slow to rise, but quick and unsafe to reutrn to bed, assist to prevent falling off EOB  Transfers Overall transfer level: Needs assistance Equipment used: 1 person hand held assist Transfers: Sit to/from Stand Sit to Stand: Mod assist;+2 safety/equipment Stand pivot transfers: Mod assist       General transfer comment: ataxic, impulsive, noted not feeling well so returned to sitting; BP sitting 87/53; after standing and return to sit 75/55  Ambulation/Gait             General Gait Details: deferred due to orthostasis   Stairs            Wheelchair Mobility    Modified Rankin (Stroke Patients Only) Modified Rankin (Stroke Patients Only) Pre-Morbid Rankin Score: No symptoms Modified Rankin:  Severe disability     Balance Overall balance assessment: Needs assistance Sitting-balance support: Feet supported Sitting balance-Leahy Scale: Poor Sitting balance - Comments: pt either self supporting on EOB with UE support or with min A; noted truncal ataxia sitting as well   Standing balance support: During functional activity;Bilateral upper extremity supported Standing balance-Leahy Scale: Poor Standing balance comment: UE support and assist (attempted without UE support needed mod A of 2 for safety                    Cognition Arousal/Alertness: Awake/alert Behavior During Therapy: Flat affect;Impulsive Overall Cognitive Status: Impaired/Different from baseline Area of Impairment: Attention;Problem solving;Following commands;Safety/judgement   Current Attention Level: Sustained   Following Commands: Follows one step commands with increased time Safety/Judgement: Decreased awareness of safety;Decreased awareness of deficits   Problem Solving: Slow processing;Decreased initiation;Difficulty sequencing;Requires verbal cues;Requires tactile cues      Exercises      General Comments General comments (skin integrity, edema, etc.): lethargic and quickly to sleep once supine; wife states he is that way after the cortrack placement in the past      Pertinent Vitals/Pain Pain Assessment: Faces Faces Pain Scale: Hurts little more Pain Location: new cortrack Pain Descriptors / Indicators: Discomfort Pain Intervention(s): Monitored during session    Home Living                      Prior Function  PT Goals (current goals can now be found in the care plan section) Progress towards PT goals: Not progressing toward goals - comment (due to low BP)    Frequency    Min 3X/week      PT Plan Current plan remains appropriate    Co-evaluation             End of Session Equipment Utilized During Treatment: Gait belt Activity Tolerance:  Treatment limited secondary to medical complications (Comment) (low BP) Patient left: in bed;with call bell/phone within reach;with family/visitor present     Time: 1453-1510 PT Time Calculation (min) (ACUTE ONLY): 17 min  Charges:  $Therapeutic Activity: 8-22 mins                    G CodesElray Mcgregor Jul 08, 2016, 5:05 PM Sheran Lawless, PT [phone] July 08, 2016

## 2016-06-13 NOTE — Progress Notes (Signed)
Paged dr. Antoine Pochehochrein at 2020 for a non-sustained 6-beat run of Vtach. Assessed pt. Asymptomatic. Vital signs WNL. No response. Will continue to monitor.

## 2016-06-13 NOTE — [place] FL2 (Signed)
MEDICAID FL2 LEVEL OF CARE SCREENING TOOL     IDENTIFICATION  Patient Name: Douglas Carr Birthdate: [birth year] Sex: male Admission Date (Current Location): 05/24/2016  [facility] and [name] Number:  Producer, television/film/video and Address:  [facility], [address] [place], [place] [address]      Provider Number: [id number]  Attending Physician Name and Address:  Zannie Cove, MD  Relative Name and Phone Number:       Current Level of Care: Hospital Recommended Level of Care: Skilled Nursing Facility Prior Approval Number:    Date Approved/Denied:   PASRR Number:   [id number]  Discharge Plan: SNF    Current Diagnoses: Patient Active Problem List   Diagnosis Date Noted  . MSSA (methicillin susceptible Staphylococcus aureus) infection   . Diabetes mellitus with complication (HCC)   . Anoxia   . Diabetes mellitus type 2 in obese (HCC)   . Aspiration pneumonia (HCC)   . Acute on chronic combined systolic and diastolic CHF (congestive heart failure) (HCC)   . Fever   . Hypernatremia   . Leukocytosis   . Acute blood loss anemia   . Myoclonus   . Acute respiratory failure with hypoxia (HCC) 05/30/2016  . Ventilator dependent (HCC)   . Palliative care by specialist   . Encounter for hospice care discussion   . Anoxic brain injury (HCC)   . Anoxic encephalopathy (HCC)   . Seizures (HCC)   . Goals of care, counseling/discussion   . Palliative care encounter   . Central venous catheter in place   . Cardiac arrest (HCC) 05/25/2016  . Acute encephalopathy   . Cardiac arrest with ventricular fibrillation (HCC) 05/24/2016  . Dilated cardiomyopathy (HCC) 05/24/2016  . Coronary artery disease involving native coronary artery of native heart with angina pectoris (HCC) 05/24/2016  . ST elevation myocardial infarction (STEMI) (HCC)   . 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  Status post coronary artery stent placement   . Acute on chronic systolic heart failure (HCC)   . Acute ST elevation myocardial infarction (STEMI) involving left circumflex coronary artery (HCC) 05/07/2016  . Essential hypertension 09/21/2013  . Diabetes mellitus (HCC) 09/21/2013  . Combined hyperlipidemia associated with type 2 diabetes mellitus 09/21/2013  . Pulmonary nodules 10/16/2011    Orientation RESPIRATION BLADDER Height & Weight     Self, Situation, Place  Normal Continent Weight: 92.2 kg (203 lb 3.2 oz) Height:  5\' 9"  (175.3 cm)  BEHAVIORAL SYMPTOMS/MOOD NEUROLOGICAL BOWEL NUTRITION STATUS   (NONE)  (NONE) Incontinent Diet (Patient will have swallow evaluation to determine if he can be on a diet or if he will need a PEG tube.)  AMBULATORY STATUS COMMUNICATION OF NEEDS Skin   Extensive Assist Verbally Normal                       Personal Care Assistance Level of Assistance  Bathing, Feeding, Dressing Bathing Assistance: Limited assistance Feeding assistance: Independent Dressing Assistance: Limited assistance     Functional Limitations Info  Sight, Hearing, Speech Sight Info: Adequate Hearing Info: Adequate Speech Info: Adequate    SPECIAL CARE FACTORS FREQUENCY  PT (By licensed PT), OT (By licensed OT), Speech therapy     PT Frequency: 5/week OT Frequency: 5/week     Speech Therapy Frequency: 5/week      Contractures Contractures Info: Not present    Additional Factors  Info  Allergies, Insulin Sliding Scale Code Status Info: Full Code Allergies Info: NKDA   Insulin Sliding Scale Info: 6/day       Current Medications (06/13/2016):  This is the current hospital active medication list Current Facility-Administered Medications  Medication Dose Route Frequency Provider Last Rate Last Dose  . acetaminophen (TYLENOL) suppository 650 mg  650 mg Rectal Q6H PRN Zigmund GottronElizabeth C Deterding, MD   650 mg at 05/31/16 1225  . acetaminophen (TYLENOL) tablet 650 mg   650 mg Oral Q4H PRN Marykay Lexavid W Harding, MD   650 mg at 06/03/16 1744  . aspirin chewable tablet 81 mg  81 mg Oral Daily Zannie CovePreetha Daleysa Kristiansen, MD      . atorvastatin (LIPITOR) tablet 80 mg  80 mg Oral q1800 Zannie CovePreetha Britley Gashi, MD   80 mg at 06/12/16 1807  . carvedilol (COREG) tablet 6.25 mg  6.25 mg Oral BID WC Mihai Croitoru, MD   6.25 mg at 06/13/16 0943  . chlorhexidine (PERIDEX) 0.12 % solution 15 mL  15 mL Mouth Rinse BID Alyson ReedyWesam G Yacoub, MD   15 mL at 06/12/16 2035  . docusate (COLACE) 50 MG/5ML liquid 100 mg  100 mg Per Tube BID Nelda Bucksaniel J Feinstein, MD   100 mg at 06/10/16 0919  . famotidine (PEPCID) 40 MG/5ML suspension 20 mg  20 mg Oral Daily Marykay Lexavid W Harding, MD   20 mg at 06/10/16 0919  . free water 200 mL  200 mL Per Tube QID Thurmon FairMihai Croitoru, MD   200 mL at 06/10/16 2139  . heparin injection 5,000 Units  5,000 Units Subcutaneous Q8H Marykay Lexavid W Harding, MD   5,000 Units at 06/13/16 0515  . insulin aspart (novoLOG) injection 0-15 Units  0-15 Units Subcutaneous Q4H Arty BaumgartnerLindsay B Roberts, NP   2 Units at 06/13/16 [phone]  . insulin aspart (novoLOG) injection 4 Units  4 Units Subcutaneous TID WC Alison MurrayAlma M Devine, MD   Stopped at 06/11/16 1715  . insulin glargine (LANTUS) injection 20 Units  20 Units Subcutaneous QHS Zannie CovePreetha Theresia Pree, MD   20 Units at 06/12/16 2034  . levETIRAcetam (KEPPRA) 100 MG/ML solution 1,000 mg  1,000 mg Oral BID Zannie CovePreetha Armoni Depass, MD      . lisinopril (PRINIVIL,ZESTRIL) tablet 5 mg  5 mg Oral Daily Alyson ReedyWesam G Yacoub, MD   5 mg at 06/12/16 1035  . loperamide (IMODIUM) capsule 2 mg  2 mg Oral BID Zannie CovePreetha Abdurrahman Petersheim, MD   2 mg at 06/13/16 0942  . MEDLINE mouth rinse  15 mL Mouth Rinse q12n4p Alyson ReedyWesam G Yacoub, MD   15 mL at 06/12/16 1600  . multivitamin with minerals tablet 1 tablet  1 tablet Oral Daily Marykay Lexavid W Harding, MD   1 tablet at 06/12/16 1035  . ondansetron (ZOFRAN) injection 4 mg  4 mg Intravenous Q6H PRN Marykay Lexavid W Harding, MD   4 mg at 05/27/16 0345  . pneumococcal 23 valent vaccine (PNU-IMMUNE) injection  0.5 mL  0.5 mL Intramuscular Prior to discharge Marykay Lexavid W Harding, MD      . tamsulosin [facility](FLOMAX) capsule 0.4 mg  0.4 mg Oral Daily Zannie CovePreetha Manasvi Dickard, MD   0.4 mg at 06/13/16 0941  . torsemide (DEMADEX) tablet 20 mg  20 mg Oral Daily Mihai Croitoru, MD   20 mg at 06/12/16 1035     Discharge Medications: Please see discharge summary for a list of discharge medications.  Relevant Imaging Results:  Relevant Lab Results:   Additional Information SSN:  [id number] The patient will have a life  vest at SNF.  Venita Lick, LCSW

## 2016-06-13 NOTE — Progress Notes (Signed)
Patient ID: Douglas Carr, male   DOB: 12-Apr-1966, 51 y.o.   MRN: [id number]  PROGRESS NOTE    NICCOLO BURGGRAF  WUJ:[id number] DOB: Apr 22, 1966 DOA: 05/24/2016  PCP: [name], MD   Brief Narrative:  51 y.o. male with a past medical history significant for CAD/ST EMI on 05/07/2016, cardiac arrest secondary to V. fib on 05/24/2016, chronic combined systolic and diastolic congestive heart failure, DM, HLD, HTN, PVD. Patient has an extensive cardiac history including the first MI at age 25. Patient was admitted to [facility] on 05/24/2016 after V. fib leading to cardiac arrest. Patient was down for an unknown period of time prior to being brought to the emergency room. He was taken emergently to the cath lab for evaluation of recent stent placement during previous admission. Since admission patient has had a very difficult and slow recovery.  Nutrition and feeding are unresolved issues  Assessment & Plan:     Cardiac arrest with ventricular fibrillation (HCC) - Admitted on 05/24/16 after cardiac arrest. -sp hypothermia protocol -Complicated cardiac history including MI at age 86, combined diastolic and systolic CHF w/ EF 25% and Grade 2 dysfunction, STEMI w/ cath and stent placement on 05/07/16.  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 - Slow improvement - Per PT - CIR recommended, per rehab MD recommended SNF rehab  - No focal deficits other than dysphagia, failed MBS with continued Neuro recovery, s/p SLP eval - d/w SLP , expect laryngeal edema to improve, SLP eval on sunday if fails then PEG, d/w IR - Per neurology there was a concern for seizure after cardiac arrest so pt placed on Keppra - now on pureed diet PRN, replace Cortrak which was pulled out - DC foley    Acute respiratory failure with hypoxia - Intubated due to cardiac  arrest, extubated 1/31 - Stable resp status since     MSSA on trach aspirate - Was on vanco and zosyn - treated with ancef through 2/7, now off    Right CEA - S/p cardiac arrest - Carotid doppler 2/5 -  acute thrombus in the right proximal CCA, significant plaque versus thrombus at the proximal ICA and ECA - Seen by vascular surgery 2/5 - s/p cardiac arrest and found to have occluded his CEA site without significant sequela. Per vascular surgery he would not benefit from intervention at this time. Will need f/u in their office for left sided stenosis on duplex and get that scheduled for 6 months. Otherwise medical management unless his course changes.     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 - increase lantus at bedtime and novolog 4 units TID - Appreciate DM coordinator recommendations    Diarrhea -doubt infectious likely tube feeds related -added imodium, DC flexiseal  DVT prophylaxis: Heparin subQ Code Status: full code  Family Communication: friend at the bedside this am  Disposition Plan: SNF when Feeding sorted out    Consultants:   TRH  Vascular   CCM  PCT  Neurology   SLP - NPO (2/6); on tube feeds  Diabetic coordinator   PT  IR  Procedures:   Carotid doppler 2/5 - acute thrombus in the right proximal CCA,  significant plaque versus thrombus at the proximal ICA and ECA  Antimicrobials:   Continue ancef through 2/7   Subjective: Waiting for Cortrak tube, feels ok, in good spirits  Objective: Vitals:   06/13/16 0041 06/13/16 0500 06/13/16 0726 06/13/16 1117  BP: (!) 90/50  118/63 (!) 102/55  Pulse: 78  81   Resp: 15  14 15   Temp: 97.8 F (36.6 C)  98.1 F (36.7 C) 97.8 F (36.6 C)  TempSrc: Axillary  Oral Oral  SpO2: 99%  99% 98%  Weight:  92.2 kg (203 lb  3.2 oz)    Height:        Intake/Output Summary (Last 24 hours) at 06/13/16 1318 Last data filed at 06/13/16 0600  Gross per 24 hour  Intake              160 ml  Output              650 ml  Net             -490 ml   Filed Weights   06/11/16 0458 06/12/16 0410 06/13/16 0500  Weight: 93 kg (205 lb) 92.9 kg (204 lb 14.4 oz) 92.2 kg (203 lb 3.2 oz)    Examination:  General exam: alert, awake, answers questions, some delay noted, oriented to self and place Respiratory system: Clear to auscultation. Respiratory effort normal. Cardiovascular system: S1 & S2 heard, Rate controlled; has SEM 2/6   Abd: soft, NT Central nervous system: Alert and oriented. No focal neurological deficits. Extremities: Symmetric 5 x 5 power. Skin: No rashes, lesions or ulcers Psychiatry: Mood & affect appropriate.   Data Reviewed: I have personally reviewed following labs and imaging studies  CBC:  Recent Labs Lab 06/10/16 0423 06/12/16 0407  WBC 9.3 9.1  HGB 11.0* 10.7*  HCT 36.2* 34.7*  MCV 87.7 87.4  PLT 300 266   Basic Metabolic Panel:  Recent Labs Lab 06/08/16 0846 06/10/16 0423 06/12/16 0407  NA 146* 143 143  K 3.5 4.1 4.1  CL 109 109 107  CO2 25 23 27   GLUCOSE 216* 284* 217*  BUN 12 18 13   CREATININE 0.80 0.96 0.89  CALCIUM 7.8* 8.0* 8.4*   GFR: Estimated Creatinine Clearance: 112.6 mL/min (by C-G formula based on SCr of 0.89 mg/dL). Liver Function Tests: No results for input(s): AST, ALT, ALKPHOS, BILITOT, PROT, ALBUMIN in the last 168 hours. No results for input(s): LIPASE, AMYLASE in the last 168 hours. No results for input(s): AMMONIA in the last 168 hours. Coagulation Profile:  Recent Labs Lab 06/12/16 1208  INR 1.46   Cardiac Enzymes: No results for input(s): CKTOTAL, CKMB, CKMBINDEX, TROPONINI in the last 168 hours. BNP (last 3 results) No results for input(s): PROBNP in the last 8760 hours. HbA1C: No results for input(s): HGBA1C in the last 72  hours. CBG:  Recent Labs Lab 06/12/16 2012 06/13/16 0049 06/13/16 0437 06/13/16 0727 06/13/16 1120  GLUCAP 173* 129* 119* 148* 175*   Lipid Profile: No results for input(s): CHOL, HDL, LDLCALC, TRIG, CHOLHDL, LDLDIRECT in the last 72 hours. Thyroid Function Tests: No results for input(s): TSH, T4TOTAL, FREET4, T3FREE, THYROIDAB in the last 72 hours. Anemia Panel: No results for input(s): VITAMINB12, FOLATE, FERRITIN, TIBC, IRON, RETICCTPCT in the last 72 hours. Urine analysis:    Component Value Date/Time   COLORURINE YELLOW 06/01/2016 1715   APPEARANCEUR HAZY (A) 06/01/2016 1715   LABSPEC 1.023 06/01/2016 1715   PHURINE 5.0 06/01/2016 1715   GLUCOSEU NEGATIVE  06/01/2016 1715   HGBUR LARGE (A) 06/01/2016 1715   BILIRUBINUR NEGATIVE 06/01/2016 1715   KETONESUR NEGATIVE 06/01/2016 1715   PROTEINUR 30 (A) 06/01/2016 1715   UROBILINOGEN 0.2 08/19/2011 2051   NITRITE NEGATIVE 06/01/2016 1715   LEUKOCYTESUR MODERATE (A) 06/01/2016 1715   Sepsis Labs: @LABRCNTIP (procalcitonin:4,lacticidven:4)   ) No results found for this or any previous visit (from the past 240 hour(s)).    Radiology Studies: Ct Abdomen Wo Contrast  Result Date: 06/12/2016 CLINICAL DATA:  Dysphagia. Please evaluate anatomy for potential percutaneous gastrostomy tube placement. EXAM: CT ABDOMEN WITHOUT CONTRAST TECHNIQUE: Multidetector CT imaging of the abdomen was performed following the standard protocol without IV contrast. COMPARISON:  None. FINDINGS: Lower chest: Limited visualization of lower thorax demonstrates minimal dependent subpleural ground-glass atelectasis. No focal airspace opacities. No pleural effusion. Normal heart size.  No pericardial effusion. Hepatobiliary: Normal hepatic contour. Layering high attenuation debris is suspected within the gallbladder potentially indicative of biliary sludge. No definite radiopaque gallstones. No definitive pericholecystic stranding on this noncontrast  examination. Pancreas: Normal noncontrast appearance of the pancreas Spleen: Punctate granuloma within an otherwise normal-appearing spleen. Adrenals/Urinary Tract: No renal stones. Minimal amount of symmetric bilateral perinephric stranding. No urine obstruction. Normal noncontrast appearance the bilateral adrenal glands. Stomach/Bowel: There is an adequate percutaneous window to the ventral aspect of the gastric antrum without interposed colon or liver. Enteric contrast is seen throughout the colon. Rectal tube appropriately position. No evidence of enteric obstruction. No pneumoperitoneum, pneumatosis or portal venous gas. Vascular/Lymphatic: Moderate amount of the centric calcified atherosclerotic plaque within the abdominal aorta and pelvic vasculature. No bulky retroperitoneal, mesenteric, pelvic or inguinal lymphadenopathy. Other: Subcutaneous stranding about the under surface of the abdominal pannus, likely a sequela of subcutaneous medication administration. Small umbilical mesenteric fat containing hernia. Musculoskeletal: No acute or aggressive osseous abnormalities. Stigmata of DISH within the thoracic spine. Moderate DDD of L5-S1 with disc space height loss, endplate irregularity and sclerosis. IMPRESSION: 1. Gastric anatomy amenable to potential percutaneous gastrostomy tube placement. 2. Appropriate positioned rectal tube. No evidence of enteric obstruction. 3. Potential biliary sludge. Electronically Signed   By: Simonne Come M.D.   On: 06/12/2016 09:48   Ct Angio Head W Or Wo Contrast  Result Date: 06/09/2016 CLINICAL DATA:  Stroke. Cardiac arrest on January 20th. Anoxic brain injury. EXAM: CT ANGIOGRAPHY HEAD TECHNIQUE: Multidetector CT imaging of the head was performed using the standard protocol during bolus administration of intravenous contrast. Multiplanar CT image reconstructions and MIPs were obtained to evaluate the vascular anatomy. CONTRAST:  50 mL Isovue 370 IV COMPARISON:  Brain MRI  06/02/2016, 05/28/2016 FINDINGS: CT HEAD Brain: No mass lesion, intraparenchymal hemorrhage or extra-axial collection. No evidence of acute cortical infarct. Faint hypoattenuation at the left thalamus and cerebral peduncle at the site of recently demonstrated acute infarct. No other focal parenchymal abnormality. Vascular: No hyperdense vessel or unexpected calcification. Skull: Normal visualized skull base, calvarium and extracranial soft tissues. Sinuses/Orbits: No sinus fluid levels or advanced mucosal thickening. No mastoid effusion. Normal orbits. CTA HEAD Anterior circulation: --Intracranial internal carotid arteries: The visualized portion of the right internal carotid artery, beginning just inferior to the skull base, is occluded. The left ICA is normal. --Anterior cerebral arteries: Normal. --Middle cerebral arteries: Normal. --Posterior communicating arteries: Absent bilaterally. Posterior circulation: --Posterior cerebral arteries: Normal. --Superior cerebellar arteries: Normal. --Basilar artery: Normal. --Anterior inferior cerebellar arteries: Not clearly visualized, which is not uncommon. --Posterior inferior cerebellar arteries: Normal. Venous sinuses: As permitted by contrast timing, patent. Anatomic  variants: None. Delayed phase: No abnormal parenchymal enhancement. IMPRESSION: 1. 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 The occlusion is favored to be subacute, given the retrospectively asymmetric appearance of the internal carotid flow voids on the prior MRI studies dating back to 05/24/2016. CTA of the neck could be performed to determine the most proximal extent of the occlusion. 2. Otherwise normal CTA of the brain. 3. Faint hypoattenuation at the site of previously demonstrated small left thalamic infarct. These results will be  called to the ordering clinician or representative by the Radiologist Assistant, and communication documented in the PACS or zVision Dashboard. Electronically Signed   By: Deatra RobinsonKevin  Herman M.D.   On: 06/09/2016 13:46   Dg Abd Portable 1v  Result Date: 06/09/2016 CLINICAL DATA:  Feeding tube placement. EXAM: PORTABLE ABDOMEN - 1 VIEW COMPARISON:  None. FINDINGS: Feeding tube tip is in the third portion of the duodenum in excellent position. Bowel gas pattern is normal. No acute bone abnormality. Contrast in the renal collecting systems from recent CT scan. IMPRESSION: Feeding tube tip in the third portion of the duodenum in good position. Electronically Signed   By: Francene BoyersJames  Maxwell M.D.   On: 06/09/2016 16:05      Scheduled Meds: . aspirin  81 mg Oral Daily  . atorvastatin  80 mg Oral q1800  . carvedilol  6.25 mg Oral BID WC  . chlorhexidine  15 mL Mouth Rinse BID  . docusate  100 mg Per Tube BID  . famotidine  20 mg Oral Daily  . free water  200 mL Per Tube QID  . heparin  5,000 Units Subcutaneous Q8H  . insulin aspart  0-15 Units Subcutaneous Q4H  . insulin aspart  4 Units Subcutaneous TID WC  . insulin glargine  20 Units Subcutaneous QHS  . levETIRAcetam  1,000 mg Oral BID  . lisinopril  5 mg Oral Daily  . loperamide  2 mg Oral BID  . mouth rinse  15 mL Mouth Rinse q12n4p  . multivitamin with minerals  1 tablet Oral Daily  . tamsulosin  0.4 mg Oral Daily  . torsemide  20 mg Oral Daily   Continuous Infusions:    LOS: 20 days    Time spent: 35 minutes  Greater than 50% of the time spent on counseling and coordinating the care.   Zannie CoveJOSEPH,Camie Hauss, MD [facility] Pager [phone]  If 7PM-7AM, please contact night-coverage [email or web address] Password [email or web address] 06/13/2016, 1:18 PM

## 2016-06-14 LAB — CBC
HEMATOCRIT: 34.4 % — AB (ref 39.0–52.0)
HEMOGLOBIN: 10.9 g/dL — AB (ref 13.0–17.0)
MCH: 28 pg (ref 26.0–34.0)
MCHC: 31.7 g/dL (ref 30.0–36.0)
MCV: 88.4 fL (ref 78.0–100.0)
PLATELETS: 234 10*3/uL (ref 150–400)
RBC: 3.89 MIL/uL — AB (ref 4.22–5.81)
RDW: 14.3 % (ref 11.5–15.5)
WBC: 8 10*3/uL (ref 4.0–10.5)

## 2016-06-14 LAB — BASIC METABOLIC PANEL
Anion gap: 13 (ref 5–15)
BUN: 22 mg/dL — ABNORMAL HIGH (ref 6–20)
CHLORIDE: 103 mmol/L (ref 101–111)
CO2: 27 mmol/L (ref 22–32)
CREATININE: 1.03 mg/dL (ref 0.61–1.24)
Calcium: 8.3 mg/dL — ABNORMAL LOW (ref 8.9–10.3)
GFR calc non Af Amer: >60 mL/min (ref >60)
Glucose, Bld: 191 mg/dL — ABNORMAL HIGH (ref 65–99)
POTASSIUM: 3.8 mmol/L (ref 3.5–5.1)
SODIUM: 143 mmol/L (ref 135–145)

## 2016-06-14 LAB — GLUCOSE, CAPILLARY
GLUCOSE-CAPILLARY: 188 mg/dL — AB (ref 65–99)
GLUCOSE-CAPILLARY: 214 mg/dL — AB (ref 65–99)
GLUCOSE-CAPILLARY: 162 mg/dL — AB (ref 65–99)
Glucose-Capillary: 154 mg/dL — ABNORMAL HIGH (ref 65–99)
Glucose-Capillary: 187 mg/dL — ABNORMAL HIGH (ref 65–99)
Glucose-Capillary: 209 mg/dL — ABNORMAL HIGH (ref 65–99)

## 2016-06-14 NOTE — Progress Notes (Signed)
Patient ID: Douglas Carr, male   DOB: [birth year], 51 y.o.   MRN: [id number]  PROGRESS NOTE    Douglas SermonsClifford M Carr  WGN:[id number] DOB: [birth year] DOA: 05/24/2016  PCP: Frederica KusterMILLER, STEPHEN M, MD   Brief Narrative:  51 y.o. male with a past medical history significant for CAD/ST EMI on 05/07/2016, cardiac arrest secondary to V. fib on 05/24/2016, chronic combined systolic and diastolic congestive heart failure, DM, HLD, HTN, PVD. Patient has an extensive cardiac history including the first MI at age 51. [facility]2016 after V. fib leading to cardiac arrest. Patient was down for an unknown period of time prior to being brought to the emergency room. He was taken emergently to the cath lab for evaluation of recent stent placement during previous admission. Since admission patient has had a very difficult and slow recovery.  Nutrition and feeding are unresolved issues  Assessment & Plan:     Cardiac arrest with ventricular fibrillation (HCC) - Admitted on 05/24/16 after cardiac arrest. -sp hypothermia protocol -Complicated cardiac history including MI at age 51, combined diastolic and systolic CHF w/ EF 25% and Grade 2 dysfunction, STEMI w/ cath and stent placement on 05/07/16.  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 - Slow improvement - Per PT - CIR recommended, per rehab MD recommended SNF rehab  - No focal deficits other than dysphagia, failed MBS with continued Neuro recovery, s/p SLP eval - d/w SLP , expect laryngeal edema to improve, SLP eval on Monday if fails then PEG, d/w IR - Per neurology there was a concern for seizure after cardiac arrest so pt placed on Keppra - now on pureed diet PRN, replaced Cortrak which was pulled out - DCed foley with intermittent retention, replace foley today if unable to void   Acute respiratory failure with hypoxia - Intubated due to cardiac arrest, extubated 1/31 - Stable resp status since     MSSA on trach aspirate - Was on vanco and zosyn - treated with ancef through 2/7, now off    Right CEA - S/p cardiac arrest - Carotid doppler 2/5 -  acute thrombus in the right proximal CCA, significant plaque versus thrombus at the proximal ICA and ECA - Seen by vascular surgery 2/5 - s/p cardiac arrest and found to have occluded his CEA site without significant sequela. Per vascular surgery he would not benefit from intervention at this time. Will need f/u in their office for left sided stenosis on duplex and get that scheduled for 6 months. Otherwise medical management unless his course changes.     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 - increase lantus at bedtime and novolog 4 units TID - Appreciate DM coordinator recommendations    Diarrhea -doubt infectious likely tube feeds related -added imodium, DCed flexiseal  DVT prophylaxis: Heparin subQ Code Status: full code  Family Communication: friend at the bedside this am  Disposition Plan: SNF when Feeding sorted out early next week   Consultants:   TRH  Vascular   CCM  PCT  Neurology   SLP - NPO (2/6); on tube feeds  Diabetic coordinator   PT  IR  Procedures:   Carotid  doppler 2/5 - acute thrombus in the right proximal CCA, significant plaque versus thrombus at the proximal ICA and ECA  Antimicrobials:   Continue ancef through 2/7   Subjective: Waiting for Cortrak tube, feels ok, in good spirits  Objective: Vitals:   06/13/16 1720 06/13/16 1945 06/14/16 0302 06/14/16 0833  BP: 101/61  (!) 96/57 (!) 120/58  Pulse:  76 82 88  Resp: 15  12 14   Temp:  97.5 F (36.4 C) 97.8 F (36.6 C) 98.5 F  (36.9 C)  TempSrc:  Oral Oral Oral  SpO2: 96% 97% 96% 95%  Weight:   92.1 kg (203 lb)   Height:        Intake/Output Summary (Last 24 hours) at 06/14/16 1131 Last data filed at 06/14/16 0344  Gross per 24 hour  Intake                0 ml  Output             1905 ml  Net            -1905 ml   Filed Weights   06/12/16 0410 06/13/16 0500 06/14/16 0302  Weight: 92.9 kg (204 lb 14.4 oz) 92.2 kg (203 lb 3.2 oz) 92.1 kg (203 lb)    Examination:  General exam: alert, awake, answers questions, some delay noted, oriented to self and place Respiratory system: Clear to auscultation. Respiratory effort normal. Cardiovascular system: S1 & S2 heard, Rate controlled; has SEM 2/6   Abd: soft, NT Central nervous system: Alert and oriented. No focal neurological deficits. Extremities: Symmetric 5 x 5 power. Skin: No rashes, lesions or ulcers Psychiatry: Mood & affect appropriate.   Data Reviewed: I have personally reviewed following labs and imaging studies  CBC:  Recent Labs Lab 06/10/16 0423 06/12/16 0407 06/14/16 0633  WBC 9.3 9.1 8.0  HGB 11.0* 10.7* 10.9*  HCT 36.2* 34.7* 34.4*  MCV 87.7 87.4 88.4  PLT 300 266 234   Basic Metabolic Panel:  Recent Labs Lab 06/08/16 0846 06/10/16 0423 06/12/16 0407 06/14/16 0633  NA 146* 143 143 143  K 3.5 4.1 4.1 3.8  CL 109 109 107 103  CO2 25 23 27 27   GLUCOSE 216* 284* 217* 191*  BUN 12 18 13  22*  CREATININE 0.80 0.96 0.89 1.03  CALCIUM 7.8* 8.0* 8.4* 8.3*   GFR: Estimated Creatinine Clearance: 97.3 mL/min (by C-G formula based on SCr of 1.03 mg/dL). Liver Function Tests: No results for input(s): AST, ALT, ALKPHOS, BILITOT, PROT, ALBUMIN in the last 168 hours. No results for input(s): LIPASE, AMYLASE in the last 168 hours. No results for input(s): AMMONIA in the last 168 hours. Coagulation Profile:  Recent Labs Lab 06/12/16 1208  INR 1.46   Cardiac Enzymes: No results for input(s): CKTOTAL, CKMB, CKMBINDEX, TROPONINI  in the last 168 hours. BNP (last 3 results) No results for input(s): PROBNP in the last 8760 hours. HbA1C: No results for input(s): HGBA1C in the last 72 hours. CBG:  Recent Labs Lab 06/13/16 1617 06/13/16 1943 06/13/16 2357 06/14/16 0301 06/14/16 0742  GLUCAP 131* 176* 154* 209* 188*   Lipid Profile: No results for input(s): CHOL, HDL, LDLCALC, TRIG, CHOLHDL, LDLDIRECT in the last 72 hours. Thyroid Function Tests: No results for input(s): TSH, T4TOTAL, FREET4, T3FREE, THYROIDAB in the last 72 hours. Anemia Panel: No results for input(s): VITAMINB12, FOLATE, FERRITIN, TIBC, IRON, RETICCTPCT in the last 72 hours. Urine analysis:    Component Value Date/Time   COLORURINE YELLOW  06/01/2016 1715   APPEARANCEUR HAZY (A) 06/01/2016 1715   LABSPEC 1.023 06/01/2016 1715   PHURINE 5.0 06/01/2016 1715   GLUCOSEU NEGATIVE 06/01/2016 1715   HGBUR LARGE (A) 06/01/2016 1715   BILIRUBINUR NEGATIVE 06/01/2016 1715   KETONESUR NEGATIVE 06/01/2016 1715   PROTEINUR 30 (A) 06/01/2016 1715   UROBILINOGEN 0.2 08/19/2011 2051   NITRITE NEGATIVE 06/01/2016 1715   LEUKOCYTESUR MODERATE (A) 06/01/2016 1715   Sepsis Labs: @LABRCNTIP (procalcitonin:4,lacticidven:4)   ) No results found for this or any previous visit (from the past 240 hour(s)).    Radiology Studies: Ct Abdomen Wo Contrast  Result Date: 06/12/2016 CLINICAL DATA:  Dysphagia. Please evaluate anatomy for potential percutaneous gastrostomy tube placement. EXAM: CT ABDOMEN WITHOUT CONTRAST TECHNIQUE: Multidetector CT imaging of the abdomen was performed following the standard protocol without IV contrast. COMPARISON:  None. FINDINGS: Lower chest: Limited visualization of lower thorax demonstrates minimal dependent subpleural ground-glass atelectasis. No focal airspace opacities. No pleural effusion. Normal heart size.  No pericardial effusion. Hepatobiliary: Normal hepatic contour. Layering high attenuation debris is suspected within  the gallbladder potentially indicative of biliary sludge. No definite radiopaque gallstones. No definitive pericholecystic stranding on this noncontrast examination. Pancreas: Normal noncontrast appearance of the pancreas Spleen: Punctate granuloma within an otherwise normal-appearing spleen. Adrenals/Urinary Tract: No renal stones. Minimal amount of symmetric bilateral perinephric stranding. No urine obstruction. Normal noncontrast appearance the bilateral adrenal glands. Stomach/Bowel: There is an adequate percutaneous window to the ventral aspect of the gastric antrum without interposed colon or liver. Enteric contrast is seen throughout the colon. Rectal tube appropriately position. No evidence of enteric obstruction. No pneumoperitoneum, pneumatosis or portal venous gas. Vascular/Lymphatic: Moderate amount of the centric calcified atherosclerotic plaque within the abdominal aorta and pelvic vasculature. No bulky retroperitoneal, mesenteric, pelvic or inguinal lymphadenopathy. Other: Subcutaneous stranding about the under surface of the abdominal pannus, likely a sequela of subcutaneous medication administration. Small umbilical mesenteric fat containing hernia. Musculoskeletal: No acute or aggressive osseous abnormalities. Stigmata of DISH within the thoracic spine. Moderate DDD of L5-S1 with disc space height loss, endplate irregularity and sclerosis. IMPRESSION: 1. Gastric anatomy amenable to potential percutaneous gastrostomy tube placement. 2. Appropriate positioned rectal tube. No evidence of enteric obstruction. 3. Potential biliary sludge. Electronically Signed   By: Simonne Come M.D.   On: 06/12/2016 09:48   Dg Abd Portable 1v  Result Date: 06/13/2016 CLINICAL DATA:  Evaluate feeding tube placement. EXAM: PORTABLE ABDOMEN - 1 VIEW COMPARISON:  None. FINDINGS: The distal tip of the feeding tube terminates in the mid right abdomen. I suspect it is in the proximal duodenum. IMPRESSION: The distal tip of  the feeding tube is likely in the proximal duodenum. Electronically Signed   By: Gerome Sam III M.D   On: 06/13/2016 15:29      Scheduled Meds: . aspirin  81 mg Oral Daily  . atorvastatin  80 mg Oral q1800  . carvedilol  6.25 mg Oral BID WC  . chlorhexidine  15 mL Mouth Rinse BID  . docusate  100 mg Per Tube BID  . famotidine  20 mg Oral Daily  . free water  200 mL Per Tube QID  . heparin  5,000 Units Subcutaneous Q8H  . insulin aspart  0-15 Units Subcutaneous Q4H  . insulin aspart  4 Units Subcutaneous TID WC  . insulin glargine  20 Units Subcutaneous QHS  . levETIRAcetam  1,000 mg Oral BID  . lisinopril  5 mg Oral Daily  . loperamide  2 mg  Oral BID  . mouth rinse  15 mL Mouth Rinse q12n4p  . multivitamin with minerals  1 tablet Oral Daily  . tamsulosin  0.4 mg Oral Daily  . torsemide  20 mg Oral Daily   Continuous Infusions: . feeding supplement (GLUCERNA 1.2 CAL) 1,000 mL (06/14/16 0008)     LOS: 21 days    Time spent: 35 minutes  Greater than 50% of the time spent on counseling and coordinating the care.   Zannie Cove, MD [facility] Pager [phone]  If 7PM-7AM, please contact night-coverage [email or web address] Password [email or web address] 06/14/2016, 11:31 AM

## 2016-06-15 LAB — BASIC METABOLIC PANEL
Anion gap: 9 (ref 5–15)
BUN: 21 mg/dL — ABNORMAL HIGH (ref 6–20)
CALCIUM: 8.5 mg/dL — AB (ref 8.9–10.3)
CO2: 30 mmol/L (ref 22–32)
CREATININE: 1.09 mg/dL (ref 0.61–1.24)
Chloride: 104 mmol/L (ref 101–111)
GFR calc non Af Amer: >60 mL/min (ref >60)
Glucose, Bld: 133 mg/dL — ABNORMAL HIGH (ref 65–99)
Potassium: 4 mmol/L (ref 3.5–5.1)
Sodium: 143 mmol/L (ref 135–145)

## 2016-06-15 LAB — GLUCOSE, CAPILLARY
GLUCOSE-CAPILLARY: 161 mg/dL — AB (ref 65–99)
GLUCOSE-CAPILLARY: 174 mg/dL — AB (ref 65–99)
GLUCOSE-CAPILLARY: 173 mg/dL — AB (ref 65–99)
Glucose-Capillary: 191 mg/dL — ABNORMAL HIGH (ref 65–99)
Glucose-Capillary: 221 mg/dL — ABNORMAL HIGH (ref 65–99)
Glucose-Capillary: 214 mg/dL — ABNORMAL HIGH (ref 65–99)

## 2016-06-15 NOTE — Progress Notes (Signed)
Kangaroo tubing pulled off cortrak, pt denies pulling it. Cortrak marking of 77 cm still the same from initial shift assessment.

## 2016-06-15 NOTE — Progress Notes (Signed)
Encourage pt to feed himself. Did well, ate 100% of his pudding.

## 2016-06-15 NOTE — Progress Notes (Signed)
Patient ID: Douglas Carr, male   DOB: [birth year], 51 y.o.   MRN: [id number]  PROGRESS NOTE    Douglas SermonsClifford M Carr  [id number] DOB: [birth year] DOA: 05/24/2016  PCP: Frederica KusterMILLER, STEPHEN M, MD   Brief Narrative:  51 y.o. male with a past medical history significant for CAD/ST EMI on 05/07/2016, cardiac arrest secondary to V. fib on 05/24/2016, chronic combined systolic and diastolic congestive heart failure, DM, HLD, HTN, PVD. Patient has an extensive cardiac history including the first MI at age 51. Patient was [facility] on 05/24/2016 after V. fib leading to cardiac arrest. Patient was down for an unknown period of time prior to being brought to the emergency room. He was taken emergently to the cath lab for evaluation of recent stent placement during previous admission. Since admission patient has had a very difficult and slow recovery.  Nutrition and feeding are unresolved issues  Assessment & Plan:     Cardiac arrest with ventricular fibrillation (HCC) - Admitted on 05/24/16 after cardiac arrest. -sp hypothermia protocol - Complicated cardiac history including MI at age 51, combined diastolic and systolic CHF w/ EF 25% and Grade 2 dysfunction, STEMI w/ cath and stent placement on 05/07/16.  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 - Slow improvement - Per PT - CIR recommended, per rehab MD recommended SNF rehab  - No focal deficits other than dysphagia, failed MBS with continued Neuro recovery, s/p SLP eval - d/w SLP , expect laryngeal edema to improve, SLP eval on Monday if fails then PEG, d/w IR - Per neurology there was a concern for seizure after cardiac arrest so pt placed on Keppra - now on pureed diet PRN, replaced Cortrak which was pulled out - DCed foley with intermittent retention, replaced foley     Acute respiratory  failure with hypoxia - Intubated due to cardiac arrest, extubated 1/31 - Stable resp status since     MSSA on trach aspirate - Was on vanco and zosyn - treated with ancef through 2/7, now off    Right CEA - S/p cardiac arrest - Carotid doppler 2/5 -  acute thrombus in the right proximal CCA, significant plaque versus thrombus at the proximal ICA and ECA - Seen by vascular surgery 2/5 - s/p cardiac arrest and found to have occluded his CEA site without significant sequela. Per vascular surgery he would not benefit from intervention at this time. Will need f/u in their office for left sided stenosis on duplex and get that scheduled for 6 months. Otherwise medical management unless his course changes.     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 - increase lantus at bedtime and novolog 4 units TID - Appreciate DM coordinator recommendations    Diarrhea -doubt infectious likely tube feeds related -added imodium, DCed flexiseal  DVT prophylaxis: Heparin subQ Code Status: full code  Family Communication: friend at the bedside this am  Disposition Plan: SNF when Feeding sorted out early next week   Consultants:   TRH  Vascular   CCM  PCT  Neurology   SLP - NPO (2/6); on tube feeds  Diabetic coordinator   PT  IR  Procedures:   Carotid  doppler 2/5 - acute thrombus in the right proximal CCA, significant plaque versus thrombus at the proximal ICA and ECA  Antimicrobials:   Continue ancef through 2/7   Subjective: Waiting for Cortrak tube, feels ok, in good spirits  Objective: Vitals:   06/15/16 0426 06/15/16 0428 06/15/16 0700 06/15/16 1150  BP: 123/69   (!) 121/42  Pulse: 79  68 73  Resp: 14   14  Temp: 98.4 F (36.9 C)  98.1 F (36.7 C) 98.2 F (36.8 C)  TempSrc: Oral  Oral  Oral  SpO2: 94%  98% 100%  Weight:  90.7 kg (200 lb)    Height:        Intake/Output Summary (Last 24 hours) at 06/15/16 1243 Last data filed at 06/15/16 0950  Gross per 24 hour  Intake              575 ml  Output              905 ml  Net             -330 ml   Filed Weights   06/13/16 0500 06/14/16 0302 06/15/16 0428  Weight: 92.2 kg (203 lb 3.2 oz) 92.1 kg (203 lb) 90.7 kg (200 lb)    Examination:  General exam: alert, awake, answers questions, some delay noted, oriented to self and place Respiratory system: Clear to auscultation. Respiratory effort normal. Cardiovascular system: S1 & S2 heard, Rate controlled; has SEM 2/6   Abd: soft, NT Central nervous system: Alert and oriented. No focal neurological deficits. Extremities: Symmetric 5 x 5 power. Skin: No rashes, lesions or ulcers Psychiatry: Mood & affect appropriate.   Data Reviewed: I have personally reviewed following labs and imaging studies  CBC:  Recent Labs Lab 06/10/16 0423 06/12/16 0407 06/14/16 0633  WBC 9.3 9.1 8.0  HGB 11.0* 10.7* 10.9*  HCT 36.2* 34.7* 34.4*  MCV 87.7 87.4 88.4  PLT 300 266 234   Basic Metabolic Panel:  Recent Labs Lab 06/10/16 0423 06/12/16 0407 06/14/16 0633 06/14/16 2353  NA 143 143 143 143  K 4.1 4.1 3.8 4.0  CL 109 107 103 104  CO2 23 27 27 30   GLUCOSE 284* 217* 191* 133*  BUN 18 13 22* 21*  CREATININE 0.96 0.89 1.03 1.09  CALCIUM 8.0* 8.4* 8.3* 8.5*   GFR: Estimated Creatinine Clearance: 91.3 mL/min (by C-G formula based on SCr of 1.09 mg/dL). Liver Function Tests: No results for input(s): AST, ALT, ALKPHOS, BILITOT, PROT, ALBUMIN in the last 168 hours. No results for input(s): LIPASE, AMYLASE in the last 168 hours. No results for input(s): AMMONIA in the last 168 hours. Coagulation Profile:  Recent Labs Lab 06/12/16 1208  INR 1.46   Cardiac Enzymes: No results for input(s): CKTOTAL, CKMB, CKMBINDEX, TROPONINI in the last 168 hours. BNP (last 3  results) No results for input(s): PROBNP in the last 8760 hours. HbA1C: No results for input(s): HGBA1C in the last 72 hours. CBG:  Recent Labs Lab 06/14/16 2006 06/15/16 0111 06/15/16 0422 06/15/16 0812 06/15/16 1148  GLUCAP 162* 161* 174* 191* 221*   Lipid Profile: No results for input(s): CHOL, HDL, LDLCALC, TRIG, CHOLHDL, LDLDIRECT in the last 72 hours. Thyroid Function Tests: No results for input(s): TSH, T4TOTAL, FREET4, T3FREE, THYROIDAB in the last 72 hours. Anemia Panel: No results for input(s): VITAMINB12, FOLATE, FERRITIN, TIBC, IRON, RETICCTPCT in the last 72 hours. Urine analysis:    Component Value Date/Time   COLORURINE YELLOW 06/01/2016 1715  APPEARANCEUR HAZY (A) 06/01/2016 1715   LABSPEC 1.023 06/01/2016 1715   PHURINE 5.0 06/01/2016 1715   GLUCOSEU NEGATIVE 06/01/2016 1715   HGBUR LARGE (A) 06/01/2016 1715   BILIRUBINUR NEGATIVE 06/01/2016 1715   KETONESUR NEGATIVE 06/01/2016 1715   PROTEINUR 30 (A) 06/01/2016 1715   UROBILINOGEN 0.2 08/19/2011 2051   NITRITE NEGATIVE 06/01/2016 1715   LEUKOCYTESUR MODERATE (A) 06/01/2016 1715   Sepsis Labs: @LABRCNTIP (procalcitonin:4,lacticidven:4)   ) No results found for this or any previous visit (from the past 240 hour(s)).    Radiology Studies: Ct Abdomen Wo Contrast  Result Date: 06/12/2016 CLINICAL DATA:  Dysphagia. Please evaluate anatomy for potential percutaneous gastrostomy tube placement. EXAM: CT ABDOMEN WITHOUT CONTRAST TECHNIQUE: Multidetector CT imaging of the abdomen was performed following the standard protocol without IV contrast. COMPARISON:  None. FINDINGS: Lower chest: Limited visualization of lower thorax demonstrates minimal dependent subpleural ground-glass atelectasis. No focal airspace opacities. No pleural effusion. Normal heart size.  No pericardial effusion. Hepatobiliary: Normal hepatic contour. Layering high attenuation debris is suspected within the gallbladder potentially indicative  of biliary sludge. No definite radiopaque gallstones. No definitive pericholecystic stranding on this noncontrast examination. Pancreas: Normal noncontrast appearance of the pancreas Spleen: Punctate granuloma within an otherwise normal-appearing spleen. Adrenals/Urinary Tract: No renal stones. Minimal amount of symmetric bilateral perinephric stranding. No urine obstruction. Normal noncontrast appearance the bilateral adrenal glands. Stomach/Bowel: There is an adequate percutaneous window to the ventral aspect of the gastric antrum without interposed colon or liver. Enteric contrast is seen throughout the colon. Rectal tube appropriately position. No evidence of enteric obstruction. No pneumoperitoneum, pneumatosis or portal venous gas. Vascular/Lymphatic: Moderate amount of the centric calcified atherosclerotic plaque within the abdominal aorta and pelvic vasculature. No bulky retroperitoneal, mesenteric, pelvic or inguinal lymphadenopathy. Other: Subcutaneous stranding about the under surface of the abdominal pannus, likely a sequela of subcutaneous medication administration. Small umbilical mesenteric fat containing hernia. Musculoskeletal: No acute or aggressive osseous abnormalities. Stigmata of DISH within the thoracic spine. Moderate DDD of L5-S1 with disc space height loss, endplate irregularity and sclerosis. IMPRESSION: 1. Gastric anatomy amenable to potential percutaneous gastrostomy tube placement. 2. Appropriate positioned rectal tube. No evidence of enteric obstruction. 3. Potential biliary sludge. Electronically Signed   By: Simonne Come M.D.   On: 06/12/2016 09:48   Dg Abd Portable 1v  Result Date: 06/13/2016 CLINICAL DATA:  Evaluate feeding tube placement. EXAM: PORTABLE ABDOMEN - 1 VIEW COMPARISON:  None. FINDINGS: The distal tip of the feeding tube terminates in the mid right abdomen. I suspect it is in the proximal duodenum. IMPRESSION: The distal tip of the feeding tube is likely in the  proximal duodenum. Electronically Signed   By: Gerome Sam III M.D   On: 06/13/2016 15:29      Scheduled Meds: . aspirin  81 mg Oral Daily  . atorvastatin  80 mg Oral q1800  . carvedilol  6.25 mg Oral BID WC  . chlorhexidine  15 mL Mouth Rinse BID  . docusate  100 mg Per Tube BID  . famotidine  20 mg Oral Daily  . free water  200 mL Per Tube QID  . heparin  5,000 Units Subcutaneous Q8H  . insulin aspart  0-15 Units Subcutaneous Q4H  . insulin aspart  4 Units Subcutaneous TID WC  . insulin glargine  20 Units Subcutaneous QHS  . levETIRAcetam  1,000 mg Oral BID  . lisinopril  5 mg Oral Daily  . mouth rinse  15 mL Mouth Rinse q12n4p  .  multivitamin with minerals  1 tablet Oral Daily  . tamsulosin  0.4 mg Oral Daily  . torsemide  20 mg Oral Daily   Continuous Infusions: . feeding supplement (GLUCERNA 1.2 CAL) 1,000 mL (06/15/16 0448)     LOS: 22 days    Time spent: 35 minutes  Greater than 50% of the time spent on counseling and coordinating the care.   Zannie Cove, MD [facility] Pager [phone]  If 7PM-7AM, please contact night-coverage [email or web address] Password [facility] 06/15/2016, 12:43 PM

## 2016-06-16 ENCOUNTER — Inpatient Hospital Stay (HOSPITAL_COMMUNITY): Payer: BC Managed Care – PPO

## 2016-06-16 LAB — GLUCOSE, CAPILLARY
GLUCOSE-CAPILLARY: 149 mg/dL — AB (ref 65–99)
GLUCOSE-CAPILLARY: 189 mg/dL — AB (ref 65–99)
GLUCOSE-CAPILLARY: 244 mg/dL — AB (ref 65–99)
Glucose-Capillary: 190 mg/dL — ABNORMAL HIGH (ref 65–99)
Glucose-Capillary: 195 mg/dL — ABNORMAL HIGH (ref 65–99)
Glucose-Capillary: 202 mg/dL — ABNORMAL HIGH (ref 65–99)
Glucose-Capillary: 206 mg/dL — ABNORMAL HIGH (ref 65–99)
Glucose-Capillary: 242 mg/dL — ABNORMAL HIGH (ref 65–99)

## 2016-06-16 MED ORDER — SENNOSIDES-DOCUSATE SODIUM 8.6-50 MG PO TABS
1.0000 | ORAL_TABLET | Freq: Two times a day (BID) | ORAL | Status: DC
  Administered 2016-06-16 – 2016-06-17 (×2): 1
  Filled 2016-06-16 (×3): qty 1

## 2016-06-16 MED ORDER — POLYETHYLENE GLYCOL 3350 17 G PO PACK
17.0000 g | PACK | Freq: Two times a day (BID) | ORAL | Status: DC
  Administered 2016-06-16 – 2016-06-17 (×2): 17 g
  Filled 2016-06-16 (×3): qty 1

## 2016-06-16 NOTE — Progress Notes (Signed)
SLP Note  Patient Details Name: Douglas SermonsClifford M Carr MRN: [id number] DOB: [birth year]  FEES equipment not working.  Pt scheduled for MBS as alternative at 1:00 today.            [name], Dreden Rivere Laurice 06/16/2016, 11:50 AM

## 2016-06-16 NOTE — Progress Notes (Signed)
   Life Vest has been approved. Awaiting pending discharge date to arrange placement of vest. Discussed with Dr. Jomarie LongsJoseph. Pt is currently having barium swallow. If passes, plan is for discharge to SNF tomorrow. If fails swallow eval, plan for probable PEG tomorrow. Dennis BastKelly Lanier will be placing Life Vest this afternoon or possibly tomorrow.  Will call wife to arrange for her to be here when Tresa EndoKelly comes.  Wife is a Runner, broadcasting/film/videoteacher, working 1/2 days and is usually here just after 12:30. She is here now and is planning to take the whole day off on patient's day of discharge. She should be available at time of Life Vest application.

## 2016-06-16 NOTE — Progress Notes (Signed)
Progress Note  Patient Name: Douglas Carr Date of Encounter: 06/16/2016  Primary Cardiologist: Allyson Sabal  Subjective   No chest pain     Inpatient Medications    Scheduled Meds: . aspirin  81 mg Oral Daily  . atorvastatin  80 mg Oral q1800  . carvedilol  6.25 mg Oral BID WC  . chlorhexidine  15 mL Mouth Rinse BID  . docusate  100 mg Per Tube BID  . famotidine  20 mg Oral Daily  . free water  200 mL Per Tube QID  . heparin  5,000 Units Subcutaneous Q8H  . insulin aspart  0-15 Units Subcutaneous Q4H  . insulin aspart  4 Units Subcutaneous TID WC  . insulin glargine  20 Units Subcutaneous QHS  . levETIRAcetam  1,000 mg Oral BID  . lisinopril  5 mg Oral Daily  . mouth rinse  15 mL Mouth Rinse q12n4p  . multivitamin with minerals  1 tablet Oral Daily  . tamsulosin  0.4 mg Oral Daily  . torsemide  20 mg Oral Daily   Continuous Infusions: . feeding supplement (GLUCERNA 1.2 CAL) 1,000 mL (06/15/16 1938)   PRN Meds: acetaminophen, acetaminophen, ondansetron (ZOFRAN) IV, pneumococcal 23 valent vaccine   Vital Signs    Vitals:   06/15/16 2021 06/16/16 0008 06/16/16 0444 06/16/16 0741  BP: (!) 103/54 (!) 108/45 121/85   Pulse: 74 73 76 72  Resp: 13 14 14 16   Temp: 97.2 F (36.2 C) 98 F (36.7 C) 98.2 F (36.8 C) 98.3 F (36.8 C)  TempSrc: Oral Oral Oral Oral  SpO2: 97% 96% 98% 99%  Weight:   202 lb (91.6 kg)   Height:        Intake/Output Summary (Last 24 hours) at 06/16/16 0743 Last data filed at 06/16/16 0500  Gross per 24 hour  Intake             2520 ml  Output             1450 ml  Net             1070 ml   Filed Weights   06/14/16 0302 06/15/16 0428 06/16/16 0444  Weight: 203 lb (92.1 kg) 200 lb (90.7 kg) 202 lb (91.6 kg)    Telemetry    NSR  - Personally Reviewed  ECG      Physical Exam   GEN: No acute distress.   Voice is very raspy  Neck: No JVD Cardiac: RRR, no murmurs, rubs, or gallops.  Respiratory: Clear to auscultation  bilaterally. GI: Soft, nontender, non-distended  MS: No edema; No deformity. Neuro:  Nonfocal   Psych: Normal affect   Labs    Chemistry  Recent Labs Lab 06/12/16 0407 06/14/16 0633 06/14/16 2353  NA 143 143 143  K 4.1 3.8 4.0  CL 107 103 104  CO2 27 27 30   GLUCOSE 217* 191* 133*  BUN 13 22* 21*  CREATININE 0.89 1.03 1.09  CALCIUM 8.4* 8.3* 8.5*  GFRNONAA >60 >60 >60  GFRAA >60 >60 >60  ANIONGAP 9 13 9      Hematology  Recent Labs Lab 06/10/16 0423 06/12/16 0407 06/14/16 0633  WBC 9.3 9.1 8.0  RBC 4.13* 3.97* 3.89*  HGB 11.0* 10.7* 10.9*  HCT 36.2* 34.7* 34.4*  MCV 87.7 87.4 88.4  MCH 26.6 27.0 28.0  MCHC 30.4 30.8 31.7  RDW 13.2 13.5 14.3  PLT 300 266 234    Cardiac EnzymesNo results for input(s): TROPONINI in  the last 168 hours. No results for input(s): TROPIPOC in the last 168 hours.   BNPNo results for input(s): BNP, PROBNP in the last 168 hours.   DDimer No results for input(s): DDIMER in the last 168 hours.   Radiology    No results found.  Cardiac Studies     Patient Profile     51 y.o. males/p cardiac arrest.  Slow progress.    Assessment & Plan    1. Cardiac arrest:  -S/P STEMI with DES to OM2 05/07/2016 -on 05/24/16 Found by family pulseless and CPR inititated wothin about a minute. CPR for about 10 minutes. AED shock in the field. Cardiac arrest was thought to VT/VF. Pt had recent STEMI and dilated CM. He was taken back tot he cath lab and found to have possible edge dissection with thrombus in the circ, treated with overlapping DES. He was placed on cooling protocol.  -No ekg STEMI, Troponin mas 0.48 -It is felt that the arrest was more related to VT/VF than to subsequent MI and a Life Vest has been planned. The patient is a candidate for defibrillator implant without waiting the 90 days post event, however, with his neurologic status, he is not yet ready to undergo defibrillator placement.  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 Anoxicencephalopathy/injury: Slow improvement  Dr. Jomarie LongsJoseph is now assuming care for the patient and is working through these issues.   3. CAD:  -Early onset with myocardial infarction at age 51 (1986), managed medically due to collateral formation; severe ischemic cardiomyopathy due to diffuse coronary disease, then STEMI of left circumflex coronary artery in January 2018, with recurrent revascularization following the cardiac arrest. Would keep on lifelong dual antiplatelet therapy  4. CHF, systolic:  -EF 25-30% per echo on 05/15/2016 -Appears euvolemic, Intake was increased with tube feedings and he is up 456 ml for last 24h. Weight appears to be down or at least stable (down 40 pounds this admission) -Diuretic has been restarted. Electrolytes and kidney function stable.         Signed, Charlton HawsPeter Cejay Cambre, MD  06/16/2016, 7:43 AM

## 2016-06-16 NOTE — Progress Notes (Signed)
OT Cancellation Note  Patient Details Name: Douglas SermonsClifford M Carr MRN: [id number] DOB: March 18, 1966   Cancelled Treatment:    Reason Eval/Treat Not Completed: Patient at procedure or test/ unavailable  [name], [name] 06/16/2016, 1:45 PM

## 2016-06-16 NOTE — Progress Notes (Signed)
We have been following pt however he is not able to walk at this time. We have completed education (in January) and [facility] has [facility] They will f/u in several weeks (to allow physical recovery). Ethelda ChickKristan Barnes Florek CES, ACSM 8:14 AM 06/16/2016

## 2016-06-16 NOTE — Progress Notes (Signed)
Patient ID: Douglas Carr, male   DOB: 1966-04-24, 51 y.o.   MRN: [id number]  PROGRESS NOTE    Douglas Carr  WUJ:[id number] DOB: 21-Oct-1965 DOA: 05/24/2016  PCP: [name], MD   Brief Narrative:  51 y.o. male with a past medical history significant for CAD/ST EMI on 05/07/2016, cardiac arrest secondary to V. fib on 05/24/2016, chronic combined systolic and diastolic congestive heart failure, DM, HLD, HTN, PVD. Patient has an extensive cardiac history including the first MI at age 89. Patient was admitted to [facility] on 05/24/2016 after V. fib leading to cardiac arrest. Patient was down for an unknown period of time prior to being brought to the emergency room. He was taken emergently to the cath lab for evaluation of recent stent placement during previous admission. Since admission patient has had a very difficult and slow recovery.  Nutrition and feeding are unresolved issues  Assessment & Plan:     Cardiac arrest with ventricular fibrillation (HCC) - Admitted on 05/24/16 after cardiac arrest. -sp hypothermia protocol - Complicated cardiac history including MI at age 88, combined diastolic and systolic CHF w/ EF 25% and Grade 2 dysfunction, STEMI w/ cath and stent placement on 05/07/16.  - Cards following currently plan for LifeVest today    Acute metabolic encephalopathy - Likely anoxic brain injury from cardiac arrest resulting in left thalamic stroke - Slow improvement - Per PT - CIR recommended, per rehab MD recommended SNF rehab  - No focal deficits other than dysphagia, failed MBS with continued Neuro recovery, s/p SLP eval - d/w SLP , expect laryngeal edema to improve, SLP eval today if fails then PEG, d/w IR - Per neurology there was a concern for seizure after cardiac arrest so pt placed on Keppra - now on pureed diet TID-tolerating this well - DCed foley with intermittent retention, replaced foley     Acute respiratory failure with hypoxia -  Intubated due to cardiac arrest, extubated 1/31 - Stable resp status since     MSSA on trach aspirate - Was on vanco and zosyn - treated with ancef through 2/7, now off    Right CEA - S/p cardiac arrest - Carotid doppler 2/5 -  acute thrombus in the right proximal CCA, significant plaque versus thrombus at the proximal ICA and ECA - Seen by vascular surgery 2/5 - s/p cardiac arrest and found to have occluded his CEA site without significant sequela. Per vascular surgery he would not benefit from intervention at this time. Will need f/u in their office for left sided stenosis on duplex and get that scheduled for 6 months. Otherwise medical management unless his course changes.     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 - increase lantus at bedtime and novolog 4 units TID - Appreciate DM coordinator recommendations    Diarrhea -doubt infectious likely tube feeds related -added imodium, DCed flexiseal  DVT prophylaxis: Heparin subQ Code Status: full code  Family Communication: friend at the bedside this am  Disposition Plan: SNF when Feeding sorted out early next week   Consultants:   TRH  Vascular   CCM  PCT  Neurology   SLP - NPO (2/6); on tube feeds  Diabetic coordinator   PT  IR  Procedures:   Carotid doppler 2/5 - acute thrombus in  the right proximal CCA, significant plaque versus thrombus at the proximal ICA and ECA  Antimicrobials:   Continue ancef through 2/7   Subjective: Feels ok, tolerating purees  Objective: Vitals:   06/16/16 0444 06/16/16 0741 06/16/16 0828 06/16/16 0959  BP: 121/85  (!) 119/47 104/66  Pulse: 76 72 88   Resp: 14 16    Temp: 98.2 F (36.8 C) 98.3 F (36.8 C)    TempSrc: Oral Oral    SpO2: 98% 99%    Weight: 91.6 kg (202 lb)     Height:         Intake/Output Summary (Last 24 hours) at 06/16/16 1140 Last data filed at 06/16/16 0500  Gross per 24 hour  Intake             2275 ml  Output             1450 ml  Net              825 ml   Filed Weights   06/14/16 0302 06/15/16 0428 06/16/16 0444  Weight: 92.1 kg (203 lb) 90.7 kg (200 lb) 91.6 kg (202 lb)    Examination:  General exam: alert, awake, answers questions, some delay noted, oriented to self and place Respiratory system: Clear to auscultation. Respiratory effort normal. Cardiovascular system: S1 & S2 heard, Rate controlled; has SEM 2/6   Abd: soft, NT Central nervous system: Alert and oriented. No focal neurological deficits. Extremities: Symmetric 5 x 5 power. Skin: No rashes, lesions or ulcers Psychiatry: Mood & affect appropriate.   Data Reviewed: I have personally reviewed following labs and imaging studies  CBC:  Recent Labs Lab 06/10/16 0423 06/12/16 0407 06/14/16 0633  WBC 9.3 9.1 8.0  HGB 11.0* 10.7* 10.9*  HCT 36.2* 34.7* 34.4*  MCV 87.7 87.4 88.4  PLT 300 266 234   Basic Metabolic Panel:  Recent Labs Lab 06/10/16 0423 06/12/16 0407 06/14/16 0633 06/14/16 2353  NA 143 143 143 143  K 4.1 4.1 3.8 4.0  CL 109 107 103 104  CO2 23 27 27 30   GLUCOSE 284* 217* 191* 133*  BUN 18 13 22* 21*  CREATININE 0.96 0.89 1.03 1.09  CALCIUM 8.0* 8.4* 8.3* 8.5*   GFR: Estimated Creatinine Clearance: 91.7 mL/min (by C-G formula based on SCr of 1.09 mg/dL). Liver Function Tests: No results for input(s): AST, ALT, ALKPHOS, BILITOT, PROT, ALBUMIN in the last 168 hours. No results for input(s): LIPASE, AMYLASE in the last 168 hours. No results for input(s): AMMONIA in the last 168 hours. Coagulation Profile:  Recent Labs Lab 06/12/16 1208  INR 1.46   Cardiac Enzymes: No results for input(s): CKTOTAL, CKMB, CKMBINDEX, TROPONINI in the last 168 hours. BNP (last 3 results) No results for input(s): PROBNP in the last 8760 hours. HbA1C: No  results for input(s): HGBA1C in the last 72 hours. CBG:  Recent Labs Lab 06/15/16 2012 06/16/16 0007 06/16/16 0435 06/16/16 0721 06/16/16 1133  GLUCAP 173* 190* 202* 195* 206*   Lipid Profile: No results for input(s): CHOL, HDL, LDLCALC, TRIG, CHOLHDL, LDLDIRECT in the last 72 hours. Thyroid Function Tests: No results for input(s): TSH, T4TOTAL, FREET4, T3FREE, THYROIDAB in the last 72 hours. Anemia Panel: No results for input(s): VITAMINB12, FOLATE, FERRITIN, TIBC, IRON, RETICCTPCT in the last 72 hours. Urine analysis:    Component Value Date/Time   COLORURINE YELLOW 06/01/2016 1715   APPEARANCEUR HAZY (A) 06/01/2016 1715   LABSPEC 1.023 06/01/2016 1715   PHURINE 5.0 06/01/2016  1715   GLUCOSEU NEGATIVE 06/01/2016 1715   HGBUR LARGE (A) 06/01/2016 1715   BILIRUBINUR NEGATIVE 06/01/2016 1715   KETONESUR NEGATIVE 06/01/2016 1715   PROTEINUR 30 (A) 06/01/2016 1715   UROBILINOGEN 0.2 08/19/2011 2051   NITRITE NEGATIVE 06/01/2016 1715   LEUKOCYTESUR MODERATE (A) 06/01/2016 1715   Sepsis Labs: @LABRCNTIP (procalcitonin:4,lacticidven:4)   ) No results found for this or any previous visit (from the past 240 hour(s)).    Radiology Studies: Dg Abd Portable 1v  Result Date: 06/13/2016 CLINICAL DATA:  Evaluate feeding tube placement. EXAM: PORTABLE ABDOMEN - 1 VIEW COMPARISON:  None. FINDINGS: The distal tip of the feeding tube terminates in the mid right abdomen. I suspect it is in the proximal duodenum. IMPRESSION: The distal tip of the feeding tube is likely in the proximal duodenum. Electronically Signed   By: Gerome Sam III M.D   On: 06/13/2016 15:29      Scheduled Meds: . aspirin  81 mg Oral Daily  . atorvastatin  80 mg Oral q1800  . carvedilol  6.25 mg Oral BID WC  . chlorhexidine  15 mL Mouth Rinse BID  . famotidine  20 mg Oral Daily  . free water  200 mL Per Tube QID  . heparin  5,000 Units Subcutaneous Q8H  . insulin aspart  0-15 Units Subcutaneous Q4H  .  insulin aspart  4 Units Subcutaneous TID WC  . insulin glargine  20 Units Subcutaneous QHS  . levETIRAcetam  1,000 mg Oral BID  . lisinopril  5 mg Oral Daily  . mouth rinse  15 mL Mouth Rinse q12n4p  . multivitamin with minerals  1 tablet Oral Daily  . polyethylene glycol  17 g Per Tube BID  . senna-docusate  1 tablet Per Tube BID  . tamsulosin  0.4 mg Oral Daily  . torsemide  20 mg Oral Daily   Continuous Infusions: . feeding supplement (GLUCERNA 1.2 CAL) 1,000 mL (06/16/16 0952)     LOS: 23 days    Time spent: 35 minutes  Greater than 50% of the time spent on counseling and coordinating the care.   Zannie Cove, MD [facility] Pager [phone]  If 7PM-7AM, please contact night-coverage [email or web address] Password [email or web address] 06/16/2016, 11:40 AM

## 2016-06-16 NOTE — Progress Notes (Signed)
Modified Barium Swallow Progress Note  Patient Details  Name: Margarita SermonsClifford M Albright MRN: [id number] Date of Birth: 09-14-1965  Today's Date: 06/16/2016  Modified Barium Swallow completed.  Full report located under Chart Review in the Imaging Section.  Brief recommendations include the following:  Clinical Impression  Pt presents with an improving pharyngeal swallow. Unable to complete FEES due to mechanical complications, so proceeded with MBS, which does not allow visualization of larynx.  Pt demonstrated ongoing silent aspiration of nectar-thick liquids during the swallow, likely related to incomplete glottal closure.  Aspirate entered the larynx posteriorly over arytenoid cartilages.  Honey-thick liquids, purees, and soft solids were consumed with adequate oral control, mild delay in initiation of swallow, and consistent airway protection.  Pt may begin a dysphagia 3 diet with honey-thick liquids.  Recommend fulll supervision due to impulsivity leading to fast rate/large boluses; meds should be crushed in puree.  Cortrak can be D/Cd.  Pt will benefit from ongoing SLP services at SNF level. Educated pt/wife re: results and recs.    Swallow Evaluation Recommendations       SLP Diet Recommendations: Dysphagia 3 (Mech soft) solids;Honey thick liquids   Liquid Administration via: Cup   Medication Administration: Crushed with puree   Supervision: Full supervision/cueing for compensatory strategies   Compensations: Minimize environmental distractions;Slow rate;Small sips/bites   Postural Changes: Remain semi-upright after after feeds/meals (Comment)   Oral Care Recommendations: Oral care BID   Other Recommendations: Order thickener from pharmacy    [name], Maricella Filyaw Laurice 06/16/2016,3:22 PM

## 2016-06-16 NOTE — Clinical Social Work Note (Signed)
CSW confirmed with patient's wife that the [facility] will [facility] once ready. CSW received call from patient's insurance company requesting updated PT, OT, and ST notes. CSW requested these clinicals today, but the patient was away. Unfortunately [employer] will not approve the patient for SNF placement without these updated documents. [employer] CM states that she will likely be able to provide a SNF authorization tomorrow (2/13) if these updated notes are received in a timely manner. CSW will submitted updated clinicals once completed.   Roddie McBryant Loralee Weitzman MSW, [place]CSW, [place], [phone]

## 2016-06-16 NOTE — Progress Notes (Signed)
PT Cancellation Note  Patient Details Name: Margarita SermonsClifford M Feher MRN: [id number] DOB: 07/21/1965   Cancelled Treatment:    Reason Eval/Treat Not Completed: Patient at procedure or test/unavailable   Aariv Medlock,KATHrine E 06/16/2016, 1:52 PM Zenovia JarredKati Eddrick Dilone, PT, DPT 06/16/2016 Pager: [phone]

## 2016-06-17 LAB — CBC
HEMATOCRIT: 34.1 % — AB (ref 39.0–52.0)
HEMOGLOBIN: 10.8 g/dL — AB (ref 13.0–17.0)
MCH: 27.8 pg (ref 26.0–34.0)
MCHC: 31.7 g/dL (ref 30.0–36.0)
MCV: 87.9 fL (ref 78.0–100.0)
Platelets: 204 10*3/uL (ref 150–400)
RBC: 3.88 MIL/uL — ABNORMAL LOW (ref 4.22–5.81)
RDW: 14.3 % (ref 11.5–15.5)
WBC: 6.6 10*3/uL (ref 4.0–10.5)

## 2016-06-17 LAB — GLUCOSE, CAPILLARY
Glucose-Capillary: 143 mg/dL — ABNORMAL HIGH (ref 65–99)
Glucose-Capillary: 164 mg/dL — ABNORMAL HIGH (ref 65–99)
Glucose-Capillary: 234 mg/dL — ABNORMAL HIGH (ref 65–99)

## 2016-06-17 LAB — BASIC METABOLIC PANEL
ANION GAP: 10 (ref 5–15)
BUN: 12 mg/dL (ref 6–20)
CO2: 32 mmol/L (ref 22–32)
Calcium: 8.8 mg/dL — ABNORMAL LOW (ref 8.9–10.3)
Chloride: 99 mmol/L — ABNORMAL LOW (ref 101–111)
Creatinine, Ser: 0.9 mg/dL (ref 0.61–1.24)
GFR calc Af Amer: >60 mL/min (ref >60)
GFR calc non Af Amer: >60 mL/min (ref >60)
GLUCOSE: 159 mg/dL — AB (ref 65–99)
POTASSIUM: 4.4 mmol/L (ref 3.5–5.1)
Sodium: 141 mmol/L (ref 135–145)

## 2016-06-17 MED ORDER — LEVETIRACETAM 100 MG/ML PO SOLN: 1000.0000 mg | Freq: Two times a day (BID) | ORAL | Status: DC

## 2016-06-17 MED ORDER — CLOPIDOGREL BISULFATE 75 MG PO TABS: 75.0000 mg | ORAL_TABLET | Freq: Every day | ORAL | Status: DC

## 2016-06-17 MED ORDER — TAMSULOSIN HCL 0.4 MG PO CAPS: 0.4000 mg | ORAL_CAPSULE | Freq: Every day | ORAL | Status: DC

## 2016-06-17 MED ORDER — TORSEMIDE 20 MG PO TABS: 20.0000 mg | ORAL_TABLET | Freq: Every day | ORAL | Status: DC

## 2016-06-17 MED ORDER — POLYETHYLENE GLYCOL 3350 17 G PO PACK: 17.0000 g | PACK | Freq: Every day | ORAL | 0 refills | Status: DC | PRN

## 2016-06-17 MED ORDER — CLOPIDOGREL BISULFATE 75 MG PO TABS
75.0000 mg | ORAL_TABLET | Freq: Every day | ORAL | Status: DC
  Administered 2016-06-17: 75 mg via ORAL
  Filled 2016-06-17: qty 1

## 2016-06-17 MED ORDER — INSULIN GLARGINE 100 UNIT/ML ~~LOC~~ SOLN: 20.0000 [IU] | Freq: Every day | SUBCUTANEOUS | Status: DC

## 2016-06-17 MED ORDER — LISINOPRIL 5 MG PO TABS: 5.0000 mg | ORAL_TABLET | Freq: Every day | ORAL | Status: DC

## 2016-06-17 NOTE — Clinical Social Work Placement (Signed)
   CLINICAL SOCIAL WORK PLACEMENT  NOTE  Date:  06/17/2016  Patient Details  Name: Douglas Carr MRN: [id number] Date of Birth: Jun 02, 1965  Clinical Social Work is seeking post-discharge placement for this patient at the Skilled  Nursing Facility level of care (*CSW will initial, date and re-position this form in  chart as items are completed):  Yes   Patient/family provided with [place] Clinical Social Work Department's list of facilities offering this level of care within the geographic area requested by the patient (or if unable, by the patient's family).  Yes   Patient/family informed of their freedom to choose among providers that offer the needed level of care, that participate in Medicare, Medicaid or managed care program needed by the patient, have an available bed and are willing to accept the patient.  Yes   Patient/family informed of 's ownership interest in [facility] and [facility], as well as of the fact that they are under no obligation to receive care at these facilities.  PASRR submitted to EDS on 06/17/16     PASRR number received on 06/17/16     Existing PASRR number confirmed on       FL2 transmitted to all facilities in geographic area requested by pt/family on 06/17/16     FL2 transmitted to all facilities within larger geographic area on       Patient informed that his/her managed care company has contracts with or will negotiate with certain facilities, including the following:        Yes   Patient/family informed of bed offers received.  [name]     Physician recommends and patient chooses bed at      [name].  Patient to be transferred to facility by Ambulance     Patient family notified on 06/17/16 of transfer.  Name of family member notified:  [name]     PHYSICIAN Please prepare priority discharge summary, including medications,  Please prepare prescriptions, Please sign FL2     Additional Comment:    _______________________________________________ Venita Lickampbell, Reshunda Strider B, LCSW 06/17/2016, 2:12 PM

## 2016-06-17 NOTE — Discharge Summary (Signed)
Physician Discharge Summary  Douglas Carr:[id number] DOB: August 31, 1965 DOA: 05/24/2016  PCP: [name], MD  Admit date: 05/24/2016 Discharge date: 06/17/2016  Time spent: 45 minutes  Recommendations for Outpatient Follow-up:  1. Dr.Jonathan Allyson Sabal 2/27 at 10:15am, Cardiology 2. Dr.Will Caminitz 2/21 at 9am, Cardiology-EP 3. [facility] care, Voiding trial in 1 week when more ambulatory, if fails needs urology eval 4. Vascular Dr.Cain for Occluded R CEA 5. SLP follow up for Dysphagia   Discharge Diagnoses:  Principal Problem:   Cardiac arrest with ventricular fibrillation (HCC) Active Problems:   Essential hypertension   Status post coronary artery stent placement   Dilated cardiomyopathy (HCC)   Coronary artery disease involving native coronary artery of native heart with angina pectoris (HCC)   ST elevation myocardial infarction (STEMI) (HCC)   Acute encephalopathy   Cardiac arrest (HCC)   Anoxic brain injury (HCC)   Anoxic encephalopathy (HCC)   Seizures (HCC)   Goals of care, counseling/discussion   Palliative care encounter   Central venous catheter in place   Encounter for hospice care discussion   Palliative care by specialist   Ventilator dependent (HCC)   Acute respiratory failure with hypoxia (HCC)   Myoclonus   Anoxia   Diabetes mellitus type 2 in obese (HCC)   Aspiration pneumonia (HCC)   Acute on chronic combined systolic and diastolic CHF (congestive heart failure) (HCC)   Fever   Hypernatremia   Leukocytosis   Acute blood loss anemia   Diabetes mellitus with complication (HCC)   MSSA (methicillin susceptible Staphylococcus aureus) infection   Discharge Condition: stable  Diet recommendation: Dysphagia 3 with honey thick liquids  Filed Weights   06/15/16 0428 06/16/16 0444 06/17/16 0339  Weight: 90.7 kg (200 lb) 91.6 kg (202 lb) 92.5 kg (204 lb)    History of present illness:  51 y.o.malewith PMH of  chronic combined systolic and  diastolic congestive heart failure, DM, HLD, HTN, PVD. Patient has an extensive cardiac history including the first MI at age 76 recent CAD/ST EMI on 05/07/2016,then had cardiac arrest secondary to V. fib on 05/24/2016,   Hospital Course:  Cardiac arrest with ventricular fibrillation (HCC) 1. Cardiac arrest:  -S/P STEMI with DES to OM2 05/07/2016 -on 1/20/18Found by family pulseless and CPR inititated within about a minute. CPR for about 10 minutes. AED shock in the field. Cardiac arrest was thought to VT/VF. Pt had recent STEMI and dilated CM. He was taken back tot he cath lab and found to have possible edge dissection with thrombus in the circ, treated with overlapping DES. He was placed on cooling protocol.  -It is felt that the arrest was more related to VT/VF than to subsequent MI and a Life Vest planned. Per Cards patient is a candidate for defibrillator implant without waiting the 90 days post event, however, with his neurologic status, he is not yet ready to undergo defibrillator placement.  -Life vest placed and will FU with Dr.Berry and EP Dr.Caminitz    Acute metabolic encephalopathy - Likely anoxic brain injury from cardiac arrest resulting in left thalamic stroke - Slow improvement - followed by PT/OT and SLP - No focal deficits other than dysphagia, failed MBS with continued Neuro recovery -PEG tube was discussed, however patient continued to slowly improve and yesterday passed his swallowing eval and started on D3 with thick liquids -tolerating diet, will need SLP FU in SNK - removed foley with intermittent retention, replaced foley     Acute respiratory failure with hypoxia -  Intubated due to cardiac arrest, extubated 1/31 - Stable resp status since     MSSA on trach aspirate - Was on vanco and zosyn - treated with ancef through 2/7, now off    Right CEA - S/p cardiac arrest - Carotid doppler 2/5 -  acute thrombus in the right proximal CCA, significant plaque versus  thrombus at the proximal ICA and ECA - Seen by vascular surgery 2/5 - s/p cardiac arrest and found to have occluded his CEA site without significant sequela. Per vascular surgery he would not benefit from intervention at this time. Will need f/u in their office for left sided stenosis on duplex and get that scheduled for 6 months.    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 - now on lantus and SSI  Consultations:  Cards, EP, PCCM  Discharge Exam: Vitals:   06/17/16 0339 06/17/16 0800  BP: 135/68 (!) 113/43  Pulse: 71 78  Resp: 14 17  Temp: 98.2 F (36.8 C) 98.3 F (36.8 C)    General: alert, awake, oriented to self and place, partly to time, mild cognitive delay Cardiovascular: S1S2/RRR Respiratory: CTAB  Discharge Instructions   Discharge Instructions    Discharge instructions    Complete by:  As directed    Dysphagia 3 with thick liquids   Increase activity slowly    Complete by:  As directed      Current Discharge Medication List    START taking these medications   Details  clopidogrel (PLAVIX) 75 MG tablet Take 1 tablet (75 mg total) by mouth daily.    insulin glargine (LANTUS) 100 UNIT/ML injection Inject 0.2 mLs (20 Units total) into the skin at bedtime.    levETIRAcetam (KEPPRA) 100 MG/ML solution Take 10 mLs (1,000 mg total) by mouth 2 (two) times daily.    lisinopril (PRINIVIL,ZESTRIL) 5 MG tablet Take 1 tablet (5 mg total) by mouth daily.    polyethylene glycol (MIRALAX / GLYCOLAX) packet Take 17 g by mouth daily as needed. Refills: 0    tamsulosin (FLOMAX) 0.4 MG CAPS capsule Take 1 capsule (0.4 mg total) by mouth daily.      CONTINUE these medications which have CHANGED   Details  torsemide (DEMADEX) 20 MG tablet Take 1 tablet (20 mg total) by mouth daily.       CONTINUE these medications which have NOT CHANGED   Details  aspirin 81 MG chewable tablet Chew 1 tablet (81 mg total) by mouth daily.    atorvastatin (LIPITOR) 80 MG tablet Take 1 tablet (80 mg total) by mouth daily at 6 PM. Qty: 90 tablet, Refills: 3    carvedilol (COREG) 6.25 MG tablet Take 1 tablet (6.25 mg total) by mouth 2 (two) times daily with a meal. Qty: 180 tablet, Refills: 3    insulin aspart (NOVOLOG FLEXPEN) 100 UNIT/ML FlexPen Inject 6 to 10 units 3 times a day prior to each meal and as needed for blood sugar greater than 200 Qty: 15 pen, Refills: 3    potassium chloride SA (K-DUR,KLOR-CON) 20 MEQ tablet Take 1 tablet (20 mEq total) by mouth daily. Qty: 90 tablet, Refills: 3    acetaminophen (TYLENOL) 325 MG tablet Take 2 tablets (650 mg total) by mouth every 4 (four) hours as needed for headache or mild pain.  STOP taking these medications     Insulin Degludec (TRESIBA FLEXTOUCH) 200 UNIT/ML SOPN      isosorbide mononitrate (IMDUR) 30 MG 24 hr tablet      losartan (COZAAR) 100 MG tablet      nitroGLYCERIN (NITROSTAT) 0.4 MG SL tablet      spironolactone (ALDACTONE) 25 MG tablet      ticagrelor (BRILINTA) 90 MG TABS tablet        No Known Allergies  Contact information for follow-up providers    Nanetta Batty, MD Follow up on 07/01/2016.   Specialties:  Cardiology, Radiology Why:  at 10:15 for general cardiology hospital follow up. Contact information: [address] [place] [address] [phone]        Will Jorja Loa, MD Follow up on 06/25/2016.   Specialty:  Cardiology Why:  at 9:00. For electrophysiology consult- cardiology. Contact information: [address] [place] [address] [phone]            Contact information for after-discharge care    Destination    [name] Follow up.   Specialty:  Skilled Nursing Facility Contact information: [address] [place] [address] [phone]                   The results of significant diagnostics from this hospitalization (including imaging, microbiology, ancillary and laboratory) are listed below for reference.    Significant Diagnostic Studies: Ct Abdomen Wo Contrast  Result Date: 06/12/2016 CLINICAL DATA:  Dysphagia. Please evaluate anatomy for potential percutaneous gastrostomy tube placement. EXAM: CT ABDOMEN WITHOUT CONTRAST TECHNIQUE: Multidetector CT imaging of the abdomen was performed following the standard protocol without IV contrast. COMPARISON:  None. FINDINGS: Lower chest: Limited visualization of lower thorax demonstrates minimal dependent subpleural ground-glass atelectasis. No focal airspace opacities. No pleural effusion. Normal heart size.  No pericardial effusion. Hepatobiliary: Normal hepatic contour. Layering high attenuation debris is suspected within the gallbladder potentially indicative of biliary sludge. No definite radiopaque gallstones. No definitive pericholecystic stranding on this noncontrast examination. Pancreas: Normal noncontrast appearance of the pancreas Spleen: Punctate granuloma within an otherwise normal-appearing spleen. Adrenals/Urinary Tract: No renal stones. Minimal amount of symmetric bilateral perinephric stranding. No urine obstruction. Normal noncontrast appearance the bilateral adrenal glands. Stomach/Bowel: There is an adequate percutaneous window to the ventral aspect of the gastric antrum without interposed colon or liver. Enteric contrast is seen throughout the colon. Rectal tube appropriately position. No evidence of enteric obstruction. No pneumoperitoneum, pneumatosis or portal venous gas. Vascular/Lymphatic: Moderate amount of the centric calcified atherosclerotic plaque within the abdominal aorta and pelvic vasculature. No bulky retroperitoneal, mesenteric, pelvic or inguinal lymphadenopathy. Other: Subcutaneous stranding about the under  surface of the abdominal pannus, likely a sequela of subcutaneous medication administration. Small umbilical mesenteric fat containing hernia. Musculoskeletal: No acute or aggressive osseous abnormalities. Stigmata of DISH within the thoracic spine. Moderate DDD of L5-S1 with disc space height loss, endplate irregularity and sclerosis. IMPRESSION: 1. Gastric anatomy amenable to potential percutaneous gastrostomy tube placement. 2. Appropriate positioned rectal tube. No evidence of enteric obstruction. 3. Potential biliary sludge. Electronically Signed   By: Simonne Come M.D.   On: 06/12/2016 09:48   Ct Angio Head W Or Wo Contrast  Result Date: 06/09/2016 CLINICAL DATA:  Stroke. Cardiac arrest on January 20th. Anoxic brain injury. EXAM: CT ANGIOGRAPHY HEAD TECHNIQUE: Multidetector CT imaging of the head was performed using the standard protocol during bolus administration of intravenous contrast. Multiplanar  CT image reconstructions and MIPs were obtained to evaluate the vascular anatomy. CONTRAST:  50 mL Isovue 370 IV COMPARISON:  Brain MRI 06/02/2016, 05/28/2016 FINDINGS: CT HEAD Brain: No mass lesion, intraparenchymal hemorrhage or extra-axial collection. No evidence of acute cortical infarct. Faint hypoattenuation at the left thalamus and cerebral peduncle at the site of recently demonstrated acute infarct. No other focal parenchymal abnormality. Vascular: No hyperdense vessel or unexpected calcification. Skull: Normal visualized skull base, calvarium and extracranial soft tissues. Sinuses/Orbits: No sinus fluid levels or advanced mucosal thickening. No mastoid effusion. Normal orbits. CTA HEAD Anterior circulation: --Intracranial internal carotid arteries: The visualized portion of the right internal carotid artery, beginning just inferior to the skull base, is occluded. The left ICA is normal. --Anterior cerebral arteries: Normal. --Middle cerebral arteries: Normal. --Posterior communicating arteries: Absent  bilaterally. Posterior circulation: --Posterior cerebral arteries: Normal. --Superior cerebellar arteries: Normal. --Basilar artery: Normal. --Anterior inferior cerebellar arteries: Not clearly visualized, which is not uncommon. --Posterior inferior cerebellar arteries: Normal. Venous sinuses: As permitted by contrast timing, patent. Anatomic variants: None. Delayed phase: No abnormal parenchymal enhancement. IMPRESSION: 1. 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 The occlusion is favored to be subacute, given the retrospectively asymmetric appearance of the internal carotid flow voids on the prior MRI studies dating back to 05/24/2016. CTA of the neck could be performed to determine the most proximal extent of the occlusion. 2. Otherwise normal CTA of the brain. 3. Faint hypoattenuation at the site of previously demonstrated small left thalamic infarct. These results will be called to the ordering clinician or representative by the Radiologist Assistant, and communication documented in the PACS or zVision Dashboard. Electronically Signed   By: Deatra Robinson M.D.   On: 06/09/2016 13:46   Ct Head Wo Contrast  Result Date: 05/24/2016 CLINICAL DATA:  51 y/o  M; seizures. EXAM: CT HEAD WITHOUT CONTRAST TECHNIQUE: Contiguous axial images were obtained from the base of the skull through the vertex without intravenous contrast. COMPARISON:  11/11/2003 CT head. FINDINGS: Brain: No evidence of acute infarction, hemorrhage, hydrocephalus, extra-axial collection or mass lesion/mass effect. Vascular: Minimal calcific atherosclerosis of cavernous internal carotid arteries. Choose Skull: Normal. Negative for fracture or focal lesion. Sinuses/Orbits: No acute finding. Other: Debris within the nasopharynx likely due to intubation. IMPRESSION: No acute intracranial  abnormality identified. No significant interval change. Electronically Signed   By: Mitzi Hansen M.D.   On: 05/24/2016 22:15   Mr Brain Wo Contrast  Result Date: 06/02/2016 CLINICAL DATA:  51 y/o M; cardiac arrest: 05/24/2016, seizures, and possible left thalamus acute infarct. EXAM: MRI HEAD WITHOUT CONTRAST TECHNIQUE: Multiplanar, multiecho pulse sequences of the brain and surrounding structures were obtained without intravenous contrast. COMPARISON:  05/28/2016 MRI of the head. FINDINGS: Brain: Unchanged small acute infarct in the left thalamus. No new diffusion signal abnormality. Foci of cortical T2 FLAIR hyperintensity scattered over convexities bilaterally likely representing small chronic infarctions. Mild diffuse brain parenchymal volume loss. No abnormal susceptibility hypointensity to indicate intracranial hemorrhage. The no focal mass effect. No hydrocephalus. No extra-axial collection. No brain structural abnormality or heterotopia. Hippocampi by are symmetric in size and signal. Vascular: Normal flow voids. Skull and upper cervical spine: Normal marrow signal. Sinuses/Orbits: Paranasal sinus mucosal thickening and trace mastoid effusions is likely due to intubation. Other: None. IMPRESSION: 1. Stable left thalamus 4 mm acute infarction. No new acute infarct  or acute intracranial hemorrhage. 2. Stable few scattered chronic cortical infarcts over the convexities bilaterally. 3. No structural cause of seizure identified. Electronically Signed   By: Mitzi Hansen M.D.   On: 06/02/2016 19:28   Mr Brain Wo Contrast  Result Date: 05/28/2016 CLINICAL DATA:  Cardiac arrest with CPR.  Anoxia. EXAM: MRI HEAD WITHOUT CONTRAST TECHNIQUE: Multiplanar, multiecho pulse sequences of the brain and surrounding structures were obtained without intravenous contrast. COMPARISON:  CT 05/24/2016 FINDINGS: Brain: Ventricle size normal.  Cerebral volume normal. 4 mm area of diffusion  hyperintensity in the left lower thalamus. This is only seen on axial diffusion and not coronal diffusion. Cannot confirm this is restricted diffusion on ADC map. Possible acute infarct. No other areas of restricted diffusion. Chronic ischemia over the convexity bilaterally right greater than left. Negative for hemorrhage or mass. Vascular: Normal arterial flow voids. Skull and upper cervical spine: Negative Sinuses/Orbits: Negative Other: None IMPRESSION: Possible 4 mm acute infarct left thalamus.  No other acute infarct. Mild chronic ischemia of the convexity bilaterally right greater than left. Electronically Signed   By: Marlan Palau M.D.   On: 05/28/2016 12:39   Dg Chest Port 1 View  Result Date: 06/04/2016 CLINICAL DATA:  History respiratory failure, followup EXAM: PORTABLE CHEST 1 VIEW COMPARISON:  Chest x-ray of 06/03/2016 and 06/02/2006, and CT chest of 04/12/2012 FINDINGS: The endotracheal tube has been removed. Aeration has improved slightly. Persistent bibasilar atelectasis is noted. Nodular opacities at the right lung base are due to calcified granulomas when compared to prior CT of the chest. Mediastinal and hilar contours are unremarkable. The heart is mildly enlarged. NG tube extends below the hemidiaphragm. IMPRESSION: 1. Slightly improved aeration with persistent bibasilar atelectasis. 2. Endotracheal tube removed. Electronically Signed   By: Dwyane Dee M.D.   On: 06/04/2016 13:02   Dg Chest Port 1 View  Result Date: 06/03/2016 CLINICAL DATA:  Status post cardiac arrest.  Intubated patient. EXAM: PORTABLE CHEST 1 VIEW COMPARISON:  Single-view of the chest 06/02/2016 and 05/31/2016. FINDINGS: Endotracheal tube and NG tube remain in place. Bibasilar atelectasis is again seen and shows some increase on the right since yesterday's study. No pneumothorax. Heart size is upper normal. Aortic atherosclerosis noted. No pneumothorax. IMPRESSION: Bibasilar subsegmental atelectasis has increased on  the right. Atherosclerosis. Electronically Signed   By: Drusilla Kanner M.D.   On: 06/03/2016 08:03   Dg Chest Port 1 View  Result Date: 06/02/2016 CLINICAL DATA:  51 year old male with endotracheal tube placement. Subsequent encounter. EXAM: PORTABLE CHEST 1 VIEW COMPARISON:  05/31/2016. FINDINGS: Endotracheal tube tip 2.7 cm above the carina. This will need to be monitored closely or retracted slightly to avoid mainstem bronchus intubation with change in patient neck position. Nasogastric tube courses below the diaphragm. Tip is not included on the present exam. Granulomas right lung. Poor inspiration with basilar atelectasis greater on left. Heart size top-normal. IMPRESSION: Endotracheal tube tip 2.7 cm above the carina. This will need to be monitored closely or retracted slightly to avoid mainstem bronchus intubation with change in patient neck position. Poor inspiration with basilar atelectasis greater on left. Electronically Signed   By: Lacy Duverney M.D.   On: 06/02/2016 12:39   Dg Chest Port 1 View  Result Date: 05/31/2016 CLINICAL DATA:  Ventilator dependent EXAM: PORTABLE CHEST 1 VIEW COMPARISON:  05/30/2016 FINDINGS: Calcified granulomas in the right lung. Low lung volumes. Heart is borderline in size. No confluent airspace opacities or effusion. Support devices are unchanged. IMPRESSION:  Low lung volumes.  No confluent opacities. Electronically Signed   By: Charlett Nose M.D.   On: 05/31/2016 08:05   Dg Chest Port 1 View  Result Date: 05/30/2016 CLINICAL DATA:  Check endotracheal tube placement EXAM: PORTABLE CHEST 1 VIEW COMPARISON:  05/28/2016 FINDINGS: Cardiac shadow is stable. Endotracheal tube, nasogastric catheter and left jugular central line are again seen and stable. Calcified granulomas are noted. Improving aeration in the left base is noted. Some persistent atelectasis is seen. IMPRESSION: Improved aeration in the left base. Mild persistent left basilar atelectasis is noted.  Tubes and lines as described. Electronically Signed   By: Alcide Clever M.D.   On: 05/30/2016 08:53   Dg Chest Port 1 View  Result Date: 05/28/2016 CLINICAL DATA:  Shortness of breath. EXAM: PORTABLE CHEST 1 VIEW COMPARISON:  05/24/2016 .  05/07/2016.  CT 04/12/2012 . FINDINGS: Endotracheal tube, left IJ line, NG tube in stable position. Cardiomegaly. Diffuse interstitial prominence with bilateral pleural effusions noted. These findings are consistent with congestive heart failure. Low lung volumes. Calcified nodular densities noted on the right consistent with granulomas. No acute bony abnormality . IMPRESSION: 1. Lines and tubes in stable position. 2. Cardiomegaly with bilateral interstitial prominence suggesting congestive heart failure. Pneumonitis cannot be excluded. Small left pleural effusion. Electronically Signed   By: Maisie Fus  Register   On: 05/28/2016 07:55   Dg Chest Port 1 View  Result Date: 05/25/2016 CLINICAL DATA:  Central line placement. EXAM: PORTABLE CHEST 1 VIEW COMPARISON:  Earlier this day at 1548 hour FINDINGS: Tip of the left internal jugular central venous catheter projects over the proximal SVC. No pneumothorax. Endotracheal tube 5 cm from the carina. Enteric tube in place. Stable cardiomegaly allowing for differences in technique. Developing hazy opacity at the right lung base suspicious for worsening pleural effusion. Worsening diffuse interstitial opacities throughout both lungs. IMPRESSION: 1. Tip of the left central line in the proximal SVC. No pneumothorax. 2. Developing hazy opacity at the right lung base, likely pleural fluid. Worsening interstitial opacities may be increasing pulmonary edema, pneumonia, or ARDS. Electronically Signed   By: Rubye Oaks M.D.   On: 05/25/2016 00:53   Dg Chest Portable 1 View  Result Date: 05/24/2016 CLINICAL DATA:  Status post intubation. EXAM: PORTABLE CHEST 1 VIEW COMPARISON:  05/12/2016 FINDINGS: Endotracheal tube has been placed and  terminates approximately 4 cm above the carina. Enteric tube courses into the left upper abdomen with tip not imaged. Cardiac silhouette is accentuated by portable AP technique and low lung volumes. Diffuse interstitial densities bilaterally have mildly increased. No large pleural effusion or pneumothorax is identified. Calcified granulomas are again seen in the right lung. IMPRESSION: 1. Endotracheal tube in satisfactory position. 2. Worsening bilateral interstitial opacities which may reflect edema or infection. Electronically Signed   By: Sebastian Ache M.D.   On: 05/24/2016 16:44   Dg Abd Portable 1v  Result Date: 06/13/2016 CLINICAL DATA:  Evaluate feeding tube placement. EXAM: PORTABLE ABDOMEN - 1 VIEW COMPARISON:  None. FINDINGS: The distal tip of the feeding tube terminates in the mid right abdomen. I suspect it is in the proximal duodenum. IMPRESSION: The distal tip of the feeding tube is likely in the proximal duodenum. Electronically Signed   By: Gerome Sam III M.D   On: 06/13/2016 15:29   Dg Abd Portable 1v  Result Date: 06/09/2016 CLINICAL DATA:  Feeding tube placement. EXAM: PORTABLE ABDOMEN - 1 VIEW COMPARISON:  None. FINDINGS: Feeding tube tip is in the third  portion of the duodenum in excellent position. Bowel gas pattern is normal. No acute bone abnormality. Contrast in the renal collecting systems from recent CT scan. IMPRESSION: Feeding tube tip in the third portion of the duodenum in good position. Electronically Signed   By: Francene Boyers M.D.   On: 06/09/2016 16:05   Dg Abd Portable 1v  Result Date: 06/06/2016 CLINICAL DATA:  Nasogastric tube placement.  Initial encounter. EXAM: PORTABLE ABDOMEN - 1 VIEW COMPARISON:  Abdominal radiograph performed 06/05/2016 FINDINGS: The patient's enteric tube is seen ending about the proximal jejunum. The visualized bowel gas pattern is grossly unremarkable. No free intra-abdominal air is seen, though evaluation for free air is limited on a  single supine view. No acute osseous abnormalities are identified. IMPRESSION: Enteric tube noted ending about the proximal jejunum. Electronically Signed   By: Roanna Raider M.D.   On: 06/06/2016 23:57   Dg Abd Portable 1v  Result Date: 06/05/2016 CLINICAL DATA:  Feeding tube placement EXAM: PORTABLE ABDOMEN - 1 VIEW COMPARISON:  06/05/2016 head 04:48 FINDINGS: The enteric tube extends through the stomach and duodenum, with tip in the region of the proximal jejunum. Visible portions of the abdominal gas pattern are unremarkable. IMPRESSION: Enteric tube extends to the proximal jejunum. Electronically Signed   By: Ellery Plunk M.D.   On: 06/05/2016 19:21   Dg Abd Portable 1v  Result Date: 06/05/2016 CLINICAL DATA:  Impaired function of the esophagogastric tube. EXAM: PORTABLE ABDOMEN - 1 VIEW COMPARISON:  Abdominal radiograph of June 01, 2016 FINDINGS: The orogastric tube tip in proximal port appear to lie in the proximal jejunum. No gastric distention is observed. There is patchy density at the left lung base. IMPRESSION: Orogastric tube tip and proximal port lie in the expected location of the proximal jejunum. Clinical correlation as to the appropriateness of this positioning is needed. Withdrawal by 20 cm would place the tip of the tube in the second portion of the duodenum. Electronically Signed   By: David  Swaziland M.D.   On: 06/05/2016 07:15   Dg Abd Portable 1v  Result Date: 06/01/2016 CLINICAL DATA:  Nasogastric tube was advanced. EXAM: PORTABLE ABDOMEN - 1 VIEW COMPARISON:  Earlier this day at 2252 hours FINDINGS: Tip of the enteric tube in the right upper quadrant of the abdomen, possible stomach or proximal duodenum. Side port of the enteric tube in the stomach. Nonobstructive bowel gas pattern in the visualized upper abdomen. IMPRESSION: Enteric tube in the stomach, tip either in the distal stomach or proximal duodenum. Electronically Signed   By: Rubye Oaks M.D.   On: 06/01/2016  23:32   Dg Abd Portable 1v  Result Date: 06/01/2016 CLINICAL DATA:  Initial evaluation for NG tube placement. EXAM: PORTABLE ABDOMEN - 1 VIEW COMPARISON:  Prior radiograph from 05/27/2016. FINDINGS: NG tube in place. Tip appears to lie at or just above the GE junction, with side hole likely an knee esophagus. Advancement by approximately 7 cm is suggested. Visualized bowel gas pattern within normal limits. IMPRESSION: Tip of NG tube at or just above the GE junction. Advancement by approximately 7 cm is suggested. Electronically Signed   By: Rise Mu M.D.   On: 06/01/2016 23:17   Dg Abd Portable 1v  Result Date: 05/27/2016 CLINICAL DATA:  Orogastric tube placement EXAM: PORTABLE ABDOMEN - 1 VIEW COMPARISON:  None. FINDINGS: Orogastric tube tip and side port in stomach. There is diffuse stool throughout the colon. No bowel dilatation or air-fluid level suggesting bowel obstruction.  No free air. IMPRESSION: Orogastric tube tip and side port in stomach. Stool throughout colon. No bowel obstruction or free air evident. Electronically Signed   By: Bretta BangWilliam  Woodruff III M.D.   On: 05/27/2016 11:23   Dg Swallowing Func-speech Pathology  Result Date: 06/16/2016 Objective Swallowing Evaluation: Type of Study: MBS-Modified Barium Swallow Study Patient Details Name: Margarita SermonsClifford M Goltz MRN: [id number] Date of Birth: [birth year] Today's Date: 06/16/2016 Time: SLP Start Time (ACUTE ONLY): 1415-SLP Stop Time (ACUTE ONLY): 1445 SLP Time Calculation (min) (ACUTE ONLY): 30 min Past Medical History: Past Medical History: Diagnosis Date . Acute ST elevation myocardial infarction (STEMI) involving left circumflex coronary artery (HCC) 05/07/2016  PCI to Cx-OM . Anginal pain (HCC)   secondary to sm. vessel disease . Anxiety  . Arthritis   BIL KNEE PAIN AND BIL ANKLE PAIN . Bone spur of ankle  . Cardiac arrest (HCC) 05/24/2016  with v fib . 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 Coronary artery disease involving native coronary artery of native heart with angina pectoris (HCC) 05/24/2016  Remote MI at 51 years of age, Last cath 2010-diffuse non-obstructive disease; last echo 06/16/08 -normal LV function, moderate concentric hypertrophy; nuc 08/2008 no ischemia;  medical therapy; STEMI May 07 2016 - PCI to Cx-OM . Diabetes mellitus   ON ORAL MEDICATION AND INSULIN . Dilated cardiomyopathy (HCC) 05/24/2016  EF 325-30% by Echo post STEMI (previously 30-35%)  . Hyperlipidemia  . Hypertension  . Myocardial infarction 1996  Post MI . Peripheral vascular disease (HCC)   HAS LEFT CAROTID ARTERY STENOSIS   AND IS S/P RIGHT CAROTID ENDARTERECTOMY 2010 Last carotid dopplers 01/08/2012 wth patent endarterectomy site . Status post coronary artery stent placement  Past Surgical History: Past Surgical History: Procedure Laterality Date . APPENDECTOMY   . CARDIAC CATHETERIZATION    FEB 2010, significant branch vessel disease wth diag, marginal, PDA & PLA, nml. LV function . CARDIAC CATHETERIZATION N/A 05/07/2016  Procedure: Left Heart Cath and Coronary Angiography;  Surgeon: Peter M SwazilandJordan, MD;  [facility];  Service: Cardiovascular;  Laterality: N/A; . CARDIAC CATHETERIZATION N/A 05/07/2016  Procedure: Coronary Stent Intervention;  Surgeon: Peter M SwazilandJordan, MD;  [facility];  Service: Cardiovascular;  Laterality: N/A; . CARDIAC CATHETERIZATION N/A 05/24/2016  Procedure: Left Heart Cath and Coronary Angiography;  Surgeon: Marykay Lexavid W Harding, MD;  [facility];  Service: Cardiovascular;  Laterality: N/A; . CARDIAC CATHETERIZATION N/A 05/24/2016  Procedure: Coronary Stent Intervention;  Surgeon: Marykay Lexavid W Harding, MD;  [facility];  Service: Cardiovascular;  Laterality: N/A;  2.5x20 Promus to Ostial/proximal circumflex . CAROTID ENDARTERECTOMY  09/2008  Rt CEA . LUNG BIOPSY  2013  Bx's suggest granulomatous dz. . RT ANKLE   2013 HPI: Mr.  Lorraine LaxLockhart is a 51 yo male with PMH of CAD (recently admitted from 1/3-1/7 for MI, had PCI of Om2), HFrEF (EF 35%), DMII (on insulin), HLD and carotid disease. He was discharged home from that admission, but returned shortly there after with 8lb weight gain, worsening dyspnea and decreased UOP despite full compliance with his outpatient diuretic. His BNP was 610 and weight was increased from 239-246lbs. Admitted with severe ischemic cardiomyopathy, presenting with quickly resuscitated VT/VF cardiac arrest and requiring repeat PCI to left circumflex roughly 2 weeks after initial presentation with STEMI. Anoxic brain injury: MRI 06/02/16 showed Stable left thalamus 4 mm acute infarction. Stable few scattered chronic  cortical infarcts over the convexities bilaterally.  ETT 1/20-1/28; 1/29-1/31.  Traumatic intubation during code.  FEES 2/7 revealed significant edema throughout larynx with poor glottal closure, leading to aspiration.   Subjective: alert, cooperative Assessment / Plan / Recommendation CHL IP CLINICAL IMPRESSIONS 06/16/2016 Therapy Diagnosis Mild pharyngeal phase dysphagia Clinical Impression Pt presents with an improving pharyngeal swallow. Unable to complete FEES due to mechanical complications, so proceeded with MBS, which does not allow visualization of larynx.  Pt demonstrated ongoing silent aspiration of nectar-thick liquids during the swallow, likely related to incomplete glottal closure.  Aspirate entered the larynx posteriorly over arytenoid cartilages.  Honey-thick liquids, purees, and soft solids were consumed with adequate oral control, mild delay in initiation of swallow, and consistent airway protection.  Pt may begin a dysphagia 3 diet with honey-thick liquids.  Recommend full supervision due to impulsivity leading to fast rate/large boluses; meds should be crushed in puree.  Cortrak can be D/Cd.  Pt will benefit from ongoing SLP services at SNF level. Educated pt/wife re: results and recs.   Impact on safety and function Mild aspiration risk   CHL IP TREATMENT RECOMMENDATION 06/16/2016 Treatment Recommendations Other (Comment)   Prognosis 06/11/2016 Prognosis for Safe Diet Advancement Good Barriers to Reach Goals -- Barriers/Prognosis Comment -- CHL IP DIET RECOMMENDATION 06/16/2016 SLP Diet Recommendations Dysphagia 3 (Mech soft) solids;Honey thick liquids Liquid Administration via Cup Medication Administration Crushed with puree Compensations Minimize environmental distractions;Slow rate;Small sips/bites Postural Changes Remain semi-upright after after feeds/meals (Comment)   CHL IP OTHER RECOMMENDATIONS 06/16/2016 Recommended Consults -- Oral Care Recommendations Oral care BID Other Recommendations Order thickener from pharmacy   CHL IP FOLLOW UP RECOMMENDATIONS 06/16/2016 Follow up Recommendations Skilled Nursing facility   [facility] IP FREQUENCY AND DURATION 06/12/2016 Speech Therapy Frequency (ACUTE ONLY) min 3x week Treatment Duration --      CHL IP ORAL PHASE 06/16/2016 Oral Phase WFL Oral - Pudding Teaspoon -- Oral - Pudding Cup -- Oral - Honey Teaspoon -- Oral - Honey Cup -- Oral - Nectar Teaspoon -- Oral - Nectar Cup -- Oral - Nectar Straw -- Oral - Thin Teaspoon -- Oral - Thin Cup -- Oral - Thin Straw -- Oral - Puree -- Oral - Mech Soft -- Oral - Regular -- Oral - Multi-Consistency -- Oral - Pill -- Oral Phase - Comment --  CHL IP PHARYNGEAL PHASE 06/16/2016 Pharyngeal Phase Impaired Pharyngeal- Pudding Teaspoon -- Pharyngeal -- Pharyngeal- Pudding Cup -- Pharyngeal -- Pharyngeal- Honey Teaspoon Delayed swallow initiation-vallecula Pharyngeal Material does not enter airway Pharyngeal- Honey Cup Delayed swallow initiation-vallecula Pharyngeal -- Pharyngeal- Nectar Teaspoon -- Pharyngeal -- Pharyngeal- Nectar Cup Delayed swallow initiation-vallecula;Reduced airway/laryngeal closure;Penetration/Aspiration during swallow;Moderate aspiration Pharyngeal Material enters airway, passes BELOW cords without attempt  by patient to eject out (silent aspiration) Pharyngeal- Nectar Straw -- Pharyngeal -- Pharyngeal- Thin Teaspoon NT Pharyngeal -- Pharyngeal- Thin Cup -- Pharyngeal -- Pharyngeal- Thin Straw -- Pharyngeal -- Pharyngeal- Puree Delayed swallow initiation-vallecula Pharyngeal Material does not enter airway Pharyngeal- Mechanical Soft WFL Pharyngeal -- Pharyngeal- Regular -- Pharyngeal -- Pharyngeal- Multi-consistency -- Pharyngeal -- Pharyngeal- Pill -- Pharyngeal -- Pharyngeal Comment --  CHL IP CERVICAL ESOPHAGEAL PHASE 06/08/2016 Cervical Esophageal Phase Impaired Pudding Teaspoon -- Pudding Cup -- Honey Teaspoon Reduced cricopharyngeal relaxation Honey Cup -- Nectar Teaspoon -- Nectar Cup -- Nectar Straw -- Thin Teaspoon Reduced cricopharyngeal relaxation Thin Cup -- Thin Straw -- Puree Reduced cricopharyngeal relaxation Mechanical Soft -- Regular -- Multi-consistency -- Pill -- Cervical Esophageal Comment appears 2/2 reduced laryngeal  elevation CHL IP GO 06/04/2016 Functional Assessment Tool Used NOMS Functional Limitations Swallowing Swallow Current Status [phone]) CM Swallow Goal Status (U0454) CL Swallow Discharge Status (U9811) (None) Motor Speech Current Status (B1478) (None) Motor Speech Goal Status (G9562) (None) Motor Speech Goal Status (Z3086) (None) Spoken Language Comprehension Current Status (V7846) (None) Spoken Language Comprehension Goal Status (N6295) (None) Spoken Language Comprehension Discharge Status [phone]) (None) Spoken Language Expression Current Status [phone]) (None) Spoken Language Expression Goal Status [phone]) (None) Spoken Language Expression Discharge Status [phone]) (None) Attention Current Status (I3474) (None) Attention Goal Status (Q5956) (None) Attention Discharge Status [phone]) (None) Memory Current Status (E3329) (None) Memory Goal Status (J1884) (None) Memory Discharge Status (Z6606) (None) Voice Current Status (T0160) (None) Voice Goal Status (F0932) (None) Voice Discharge Status  [phone]) (None) Other Speech-Language Pathology Functional Limitation [phone]) (None) Other Speech-Language Pathology Functional Limitation Goal Status (K2706) (None) Other Speech-Language Pathology Functional Limitation Discharge Status [phone]) (None) Blenda Mounts Laurice 06/16/2016, 3:17 PM              Dg Swallowing Func-speech Pathology  Result Date: 06/08/2016 Objective Swallowing Evaluation: Type of Study: MBS-Modified Barium Swallow Study Patient Details Name: THOMES BURAK MRN: [id number] Date of Birth: [birth year] Today's Date: 06/08/2016 Time: SLP Start Time (ACUTE ONLY): 1214-SLP Stop Time (ACUTE ONLY): 1231 SLP Time Calculation (min) (ACUTE ONLY): 17 min Past Medical History: Past Medical History: Diagnosis Date . Acute ST elevation myocardial infarction (STEMI) involving left circumflex coronary artery (HCC) 05/07/2016  PCI to Cx-OM . Anginal pain (HCC)   secondary to sm. vessel disease . Anxiety  . Arthritis   BIL KNEE PAIN AND BIL ANKLE PAIN . Bone spur of ankle  . Cardiac arrest (HCC) 05/24/2016  with v fib . 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 Coronary artery disease involving native coronary artery of native heart with angina pectoris (HCC) 05/24/2016  Remote MI at 51 years of age, Last cath 2010-diffuse non-obstructive disease; last echo 06/16/08 -normal LV function, moderate concentric hypertrophy; nuc 08/2008 no ischemia;  medical therapy; STEMI May 07 2016 - PCI to Cx-OM . Diabetes mellitus   ON ORAL MEDICATION AND INSULIN . Dilated cardiomyopathy (HCC) 05/24/2016  EF 325-30% by Echo post STEMI (previously 30-35%)  . Hyperlipidemia  . Hypertension  . Myocardial infarction 1996  Post MI . Peripheral vascular disease (HCC)   HAS LEFT CAROTID ARTERY STENOSIS   AND IS S/P RIGHT CAROTID ENDARTERECTOMY 2010 Last carotid dopplers 01/08/2012 wth patent endarterectomy site . Status post coronary artery stent placement  Past Surgical History: Past  Surgical History: Procedure Laterality Date . APPENDECTOMY   . CARDIAC CATHETERIZATION    FEB 2010, significant branch vessel disease wth diag, marginal, PDA & PLA, nml. LV function . CARDIAC CATHETERIZATION N/A 05/07/2016  Procedure: Left Heart Cath and Coronary Angiography;  Surgeon: Peter M Swaziland, MD;  Location[facility] INVAS[facility] CV LAB;  Service: Cardiovascular;  Laterality: N/A; . CARDIAC CATHETERIZATION N/A 05/07/2016  Procedure: Coronary Stent Intervention;  Surgeon: Peter M Swaziland, MD;  Location: [facility] INVASIVE CV LAB;  Service: Cardiovascular;  Laterality: N/A; . CARDIAC CATHETERIZATION N/A 05/24/2016  Procedure: Left Heart Cath and Coronary Angiography;  Surgeon: Marykay Lex, MD;  Location[facility] INVASIVE CV LAB;  Service: Cardiovascular;  Laterality: N/A; . CARDIAC CATHETERIZATION N/A 05/24/2016  Procedure: Coronary Stent Intervention;  Surgeon: Marykay Lex, MD;  Location: [facility] INVASIVE CV LAB;  Service: Cardiovascular;  Laterality: N/A;  2.5x20 Promus to Ostial/proximal circumflex .  CAROTID ENDARTERECTOMY  09/2008  Rt CEA . LUNG BIOPSY  2013  Bx's suggest granulomatous dz. . RT ANKLE   2013 HPI: Mr. Aleshire is a 51 yo male with PMH of CAD(recently admitted from 1/3-1/7 for MI, had PCI of Om2), HFrEF (EF 35%), DMII (on insulin), HLD and carotid disease. He was discharged home from that admission, but returned shortly there after with 8lb weight gain, worsening dyspnea and decreased UOP despite full compliance with his outpatient diuretic. His BNP was 610 and weight was increased from 239-246lbs. Admitted with severe ischemic cardiomyopathy, presenting with quickly resuscitated VT/VF cardiac arrest and requiring repeat PCI to left circumflex roughly 2 weeks after initial presentation with STEMI. Anoxic brain injury: MRI 06/02/16 showed Stable left thalamus 4 mm acute infarction. Stable few scattered chronic cortical infarcts over the convexities bilaterally. Subjective: Alert, eager to eat Assessment / Plan /  Recommendation CHL IP CLINICAL IMPRESSIONS 06/08/2016 Therapy Diagnosis Severe pharyngeal phase dysphagia Clinical Impression Patient presents with severe pharyngeal dysphagia with significant silent aspiration of thin liquids (~1/2 tsp), moderate aspiration of honey-thick liquids with cough response, and moderate silent aspiration of pureed. Oral stage of swallowing appeared within functional limits. Swallow initation delayed to the level of the pyriform sinuses with thin liquids. Hyolaryngeal elevation significantly reduced, leading to incomplete epiglottic deflection, reduced amplitude and duration of UES opening, obstruction of bolus flow which spills into the laryngeal vestibule and is silently aspirated during the swallow. Moderate amount of residue remains in the valleculae/ post-cricoid segment with all consistencies trialed (thin, honey, puree), leading to penetration and ultimately aspiration after the swallow. Chin tuck was trialed x1 with thin liquids but is ineffective in preventing aspiration. Based on the above deficits, diet initiation is not advised at this time. Patient may benefit from exercises to address physiologic impairments as well as training in compensatory techniques to improve swallow function. Recommend patient remain NPO; will likely need temporary source of alternative nutrition. SLP will continue to follow.  Impact on safety and function Severe aspiration risk   CHL IP TREATMENT RECOMMENDATION 06/08/2016 Treatment Recommendations Therapy as outlined in treatment plan below   Prognosis 06/08/2016 Prognosis for Safe Diet Advancement Fair Barriers to Reach Goals Cognitive deficits;Severity of deficits Barriers/Prognosis Comment -- CHL IP DIET RECOMMENDATION 06/08/2016 SLP Diet Recommendations NPO;Alternative means - temporary Liquid Administration via -- Medication Administration Via alternative means Compensations -- Postural Changes --   CHL IP OTHER RECOMMENDATIONS 06/08/2016 Recommended  Consults -- Oral Care Recommendations Oral care QID Other Recommendations --   CHL IP FOLLOW UP RECOMMENDATIONS 06/08/2016 Follow up Recommendations Inpatient Rehab   CHL IP FREQUENCY AND DURATION 06/08/2016 Speech Therapy Frequency (ACUTE ONLY) min 2x/week Treatment Duration 2 weeks      CHL IP ORAL PHASE 06/08/2016 Oral Phase WFL Oral - Pudding Teaspoon -- Oral - Pudding Cup -- Oral - Honey Teaspoon -- Oral - Honey Cup -- Oral - Nectar Teaspoon -- Oral - Nectar Cup -- Oral - Nectar Straw -- Oral - Thin Teaspoon -- Oral - Thin Cup -- Oral - Thin Straw -- Oral - Puree -- Oral - Mech Soft -- Oral - Regular -- Oral - Multi-Consistency -- Oral - Pill -- Oral Phase - Comment --  CHL IP PHARYNGEAL PHASE 06/08/2016 Pharyngeal Phase Impaired Pharyngeal- Pudding Teaspoon -- Pharyngeal -- Pharyngeal- Pudding Cup -- Pharyngeal -- Pharyngeal- Honey Teaspoon Reduced airway/laryngeal closure;Reduced laryngeal elevation;Reduced epiglottic inversion;Penetration/Apiration after swallow;Pharyngeal residue - valleculae;Pharyngeal residue - cp segment;Moderate aspiration Pharyngeal Material enters airway, passes BELOW cords  and not ejected out despite cough attempt by patient Pharyngeal- Honey Cup -- Pharyngeal -- Pharyngeal- Nectar Teaspoon -- Pharyngeal -- Pharyngeal- Nectar Cup -- Pharyngeal -- Pharyngeal- Nectar Straw -- Pharyngeal -- Pharyngeal- Thin Teaspoon Delayed swallow initiation-pyriform sinuses;Reduced laryngeal elevation;Reduced airway/laryngeal closure;Penetration/Aspiration during swallow;Penetration/Apiration after swallow;Pharyngeal residue - cp segment;Pharyngeal residue - valleculae;Significant aspiration (Amount) Pharyngeal Material enters airway, passes BELOW cords without attempt by patient to eject out (silent aspiration) Pharyngeal- Thin Cup -- Pharyngeal -- Pharyngeal- Thin Straw -- Pharyngeal -- Pharyngeal- Puree Reduced epiglottic inversion;Reduced laryngeal elevation;Moderate aspiration;Penetration/Apiration after  swallow;Pharyngeal residue - valleculae;Pharyngeal residue - cp segment;Reduced airway/laryngeal closure Pharyngeal Material enters airway, passes BELOW cords without attempt by patient to eject out (silent aspiration) Pharyngeal- Mechanical Soft -- Pharyngeal -- Pharyngeal- Regular -- Pharyngeal -- Pharyngeal- Multi-consistency -- Pharyngeal -- Pharyngeal- Pill -- Pharyngeal -- Pharyngeal Comment --  CHL IP CERVICAL ESOPHAGEAL PHASE 06/08/2016 Cervical Esophageal Phase Impaired Pudding Teaspoon -- Pudding Cup -- Honey Teaspoon Reduced cricopharyngeal relaxation Honey Cup -- Nectar Teaspoon -- Nectar Cup -- Nectar Straw -- Thin Teaspoon Reduced cricopharyngeal relaxation Thin Cup -- Thin Straw -- Puree Reduced cricopharyngeal relaxation Mechanical Soft -- Regular -- Multi-consistency -- Pill -- Cervical Esophageal Comment appears 2/2 reduced laryngeal elevation CHL IP GO 06/04/2016 Functional Assessment Tool Used NOMS Functional Limitations Swallowing Swallow Current Status (Z6109) CM Swallow Goal Status (U0454) CL Swallow Discharge Status (U9811) (None) Motor Speech Current Status (B1478) (None) Motor Speech Goal Status (G9562) (None) Motor Speech Goal Status (Z3086) (None) Spoken Language Comprehension Current Status (V7846) (None) Spoken Language Comprehension Goal Status (N6295) (None) Spoken Language Comprehension Discharge Status (M8413) (None) Spoken Language Expression Current Status (K4401) (None) Spoken Language Expression Goal Status (U2725) (None) Spoken Language Expression Discharge Status (D6644) (None) Attention Current Status (I3474) (None) Attention Goal Status (Q5956) (None) Attention Discharge Status (L8756) (None) Memory Current Status (E3329) (None) Memory Goal Status (J1884) (None) Memory Discharge Status (Z6606) (None) Voice Current Status (T0160) (None) Voice Goal Status (F0932) (None) Voice Discharge Status (T5573) (None) Other Speech-Language Pathology Functional Limitation (U2025) (None)  Other Speech-Language Pathology Functional Limitation Goal Status (K2706) (None) Other Speech-Language Pathology Functional Limitation Discharge Status [phone]) (None) Arlana Lindau 06/08/2016, 2:37 PM Rondel Baton, MS CF-SLP Speech-Language Pathologist [phone]              Microbiology: No results found for this or any previous visit (from the past 240 hour(s)).   Labs: Basic Metabolic Panel:  Recent Labs Lab 06/12/16 0407 06/14/16 1607 06/14/16 2353 06/17/16 0451  NA 143 143 143 141  K 4.1 3.8 4.0 4.4  CL 107 103 104 99*  CO2 27 27 30  32  GLUCOSE 217* 191* 133* 159*  BUN 13 22* 21* 12  CREATININE 0.89 1.03 1.09 0.90  CALCIUM 8.4* 8.3* 8.5* 8.8*   Liver Function Tests: No results for input(s): AST, ALT, ALKPHOS, BILITOT, PROT, ALBUMIN in the last 168 hours. No results for input(s): LIPASE, AMYLASE in the last 168 hours. No results for input(s): AMMONIA in the last 168 hours. CBC:  Recent Labs Lab 06/12/16 0407 06/14/16 0633 06/17/16 0451  WBC 9.1 8.0 6.6  HGB 10.7* 10.9* 10.8*  HCT 34.7* 34.4* 34.1*  MCV 87.4 88.4 87.9  PLT 266 234 204   Cardiac Enzymes: No results for input(s): CKTOTAL, CKMB, CKMBINDEX, TROPONINI in the last 168 hours. BNP: BNP (last 3 results)  Recent Labs  05/07/16 0315 05/08/16 1508 05/12/16 2007  BNP 811.3* 483.1* 610.1*    ProBNP (last 3 results) No results  for input(s): PROBNP in the last 8760 hours.  CBG:  Recent Labs Lab 06/16/16 1640 06/16/16 2009 06/16/16 2325 06/17/16 0335 06/17/16 0815  GLUCAP 242* 244* 189* 143* 164*       SignedZannie Cove MD.  [facility] 06/17/2016, 10:50 AM

## 2016-06-17 NOTE — Clinical Social Work Note (Signed)
CSW requested assistance of charge RN with getting patient seen by PT and OT this morning so that [facility] Patient to DC today.  Roddie McBryant Dayshawn Irizarry MSW, LouannLCSW, [place], [phone]

## 2016-06-17 NOTE — Progress Notes (Signed)
Progress Note  Patient Name: Douglas Carr Date of Encounter: 06/17/2016  Primary Cardiologist: Allyson Sabal  Subjective   No chest pain Passed swallowing study except thin liquids so getting diet And no PEG    Inpatient Medications    Scheduled Meds: . aspirin  81 mg Oral Daily  . atorvastatin  80 mg Oral q1800  . carvedilol  6.25 mg Oral BID WC  . chlorhexidine  15 mL Mouth Rinse BID  . famotidine  20 mg Oral Daily  . heparin  5,000 Units Subcutaneous Q8H  . insulin aspart  0-15 Units Subcutaneous Q4H  . insulin aspart  4 Units Subcutaneous TID WC  . insulin glargine  20 Units Subcutaneous QHS  . levETIRAcetam  1,000 mg Oral BID  . lisinopril  5 mg Oral Daily  . mouth rinse  15 mL Mouth Rinse q12n4p  . multivitamin with minerals  1 tablet Oral Daily  . polyethylene glycol  17 g Per Tube BID  . senna-docusate  1 tablet Per Tube BID  . tamsulosin  0.4 mg Oral Daily  . torsemide  20 mg Oral Daily   Continuous Infusions:  PRN Meds: acetaminophen, acetaminophen, ondansetron (ZOFRAN) IV, pneumococcal 23 valent vaccine   Vital Signs    Vitals:   06/16/16 1715 06/16/16 2213 06/17/16 0022 06/17/16 0339  BP: 104/80 (!) 108/56 (!) 102/49 135/68  Pulse: 88 78 74 71  Resp:   12 14  Temp:  99.3 F (37.4 C) 98.4 F (36.9 C) 98.2 F (36.8 C)  TempSrc:  Oral Oral Oral  SpO2:  97% 98% 96%  Weight:    204 lb (92.5 kg)  Height:        Intake/Output Summary (Last 24 hours) at 06/17/16 0750 Last data filed at 06/17/16 0600  Gross per 24 hour  Intake              420 ml  Output             1650 ml  Net            -1230 ml   Filed Weights   06/15/16 0428 06/16/16 0444 06/17/16 0339  Weight: 200 lb (90.7 kg) 202 lb (91.6 kg) 204 lb (92.5 kg)    Telemetry    NSR  - Personally Reviewed  ECG      Physical Exam   GEN: No acute distress.   Voice is very raspy  Neck: No JVD Cardiac: RRR, no murmurs, rubs, or gallops.  Respiratory: Clear to auscultation  bilaterally. GI: Soft, nontender, non-distended  MS: No edema; No deformity. Neuro:  Nonfocal   Psych: Normal affect   Labs    Chemistry  Recent Labs Lab 06/14/16 0633 06/14/16 2353 06/17/16 0451  NA 143 143 141  K 3.8 4.0 4.4  CL 103 104 99*  CO2 27 30 32  GLUCOSE 191* 133* 159*  BUN 22* 21* 12  CREATININE 1.03 1.09 0.90  CALCIUM 8.3* 8.5* 8.8*  GFRNONAA >60 >60 >60  GFRAA >60 >60 >60  ANIONGAP 13 9 10      Hematology  Recent Labs Lab 06/12/16 0407 06/14/16 0633 06/17/16 0451  WBC 9.1 8.0 6.6  RBC 3.97* 3.89* 3.88*  HGB 10.7* 10.9* 10.8*  HCT 34.7* 34.4* 34.1*  MCV 87.4 88.4 87.9  MCH 27.0 28.0 27.8  MCHC 30.8 31.7 31.7  RDW 13.5 14.3 14.3  PLT 266 234 204    Cardiac EnzymesNo results for input(s): TROPONINI in the last 168  hours. No results for input(s): TROPIPOC in the last 168 hours.   BNPNo results for input(s): BNP, PROBNP in the last 168 hours.   DDimer No results for input(s): DDIMER in the last 168 hours.   Radiology    Dg Swallowing Func-speech Pathology  Result Date: 06/16/2016 Objective Swallowing Evaluation: Type of Study: MBS-Modified Barium Swallow Study Patient Details Name: Douglas Carr MRN: [id number] Date of Birth: [birth year] Today's Date: 06/16/2016 Time: SLP Start Time (ACUTE ONLY): 1415-SLP Stop Time (ACUTE ONLY): 1445 SLP Time Calculation (min) (ACUTE ONLY): 30 min Past Medical History: Past Medical History: Diagnosis Date . Acute ST elevation myocardial infarction (STEMI) involving left circumflex coronary artery (HCC) 05/07/2016  PCI to Cx-OM . Anginal pain (HCC)   secondary to sm. vessel disease . Anxiety  . Arthritis   BIL KNEE PAIN AND BIL ANKLE PAIN . Bone spur of ankle  . Cardiac arrest (HCC) 05/24/2016  with v fib . 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 Coronary artery disease involving native coronary artery of native heart with angina pectoris (HCC) 05/24/2016  Remote  MI at 51 years of age, Last cath 2010-diffuse non-obstructive disease; last echo 06/16/08 -normal LV function, moderate concentric hypertrophy; nuc 08/2008 no ischemia;  medical therapy; STEMI May 07 2016 - PCI to Cx-OM . Diabetes mellitus   ON ORAL MEDICATION AND INSULIN . Dilated cardiomyopathy (HCC) 05/24/2016  EF 325-30% by Echo post STEMI (previously 30-35%)  . Hyperlipidemia  . Hypertension  . Myocardial infarction 1996  Post MI . Peripheral vascular disease (HCC)   HAS LEFT CAROTID ARTERY STENOSIS   AND IS S/P RIGHT CAROTID ENDARTERECTOMY 2010 Last carotid dopplers 01/08/2012 wth patent endarterectomy site . Status post coronary artery stent placement  Past Surgical History: Past Surgical History: Procedure Laterality Date . APPENDECTOMY   . CARDIAC CATHETERIZATION    FEB 2010, significant branch vessel disease wth diag, marginal, PDA & PLA, nml. LV function . CARDIAC CATHETERIZATION N/A 05/07/2016  Procedure: Left Heart Cath and Coronary Angiography;  Surgeon: Peter M Swaziland, MD;  Location: [facility] INVASIVE CV LAB;  Service: Cardiovascular;  Laterality: N/A; . CARDIAC CATHETERIZATION N/A 05/07/2016  Procedure: Coronary Stent Intervention;  Surgeon: Peter M Swaziland, MD;  Location: [facility] INVASIVE CV LAB;  Service: Cardiovascular;  Laterality: N/A; . CARDIAC CATHETERIZATION N/A 05/24/2016  Procedure: Left Heart Cath and Coronary Angiography;  Surgeon: Marykay Lex, MD;  Location: [facility] INVASIVE CV LAB;  Service: Cardiovascular;  Laterality: N/A; . CARDIAC CATHETERIZATION N/A 05/24/2016  Procedure: Coronary Stent Intervention;  Surgeon: Marykay Lex, MD;  Location: [facility] INVASIVE CV LAB;  Service: Cardiovascular;  Laterality: N/A;  2.5x20 Promus to Ostial/proximal circumflex . CAROTID ENDARTERECTOMY  09/2008  Rt CEA . LUNG BIOPSY  2013  Bx's suggest granulomatous dz. . RT ANKLE   2013 HPI: Mr. Hinchliffe is a 51 yo male with PMH of CAD (recently admitted from 1/3-1/7 for MI, had PCI of Om2), HFrEF (EF 35%), DMII (on insulin), HLD and  carotid disease. He was discharged home from that admission, but returned shortly there after with 8lb weight gain, worsening dyspnea and decreased UOP despite full compliance with his outpatient diuretic. His BNP was 610 and weight was increased from 239-246lbs. Admitted with severe ischemic cardiomyopathy, presenting with quickly resuscitated VT/VF cardiac arrest and requiring repeat PCI to left circumflex roughly 2 weeks after initial presentation with STEMI. Anoxic brain injury: MRI 06/02/16 showed Stable left thalamus 4 mm acute infarction.  Stable few scattered chronic cortical infarcts over the convexities bilaterally.  ETT 1/20-1/28; 1/29-1/31.  Traumatic intubation during code.  FEES 2/7 revealed significant edema throughout larynx with poor glottal closure, leading to aspiration.   Subjective: alert, cooperative Assessment / Plan / Recommendation CHL IP CLINICAL IMPRESSIONS 06/16/2016 Therapy Diagnosis Mild pharyngeal phase dysphagia Clinical Impression Pt presents with an improving pharyngeal swallow. Unable to complete FEES due to mechanical complications, so proceeded with MBS, which does not allow visualization of larynx.  Pt demonstrated ongoing silent aspiration of nectar-thick liquids during the swallow, likely related to incomplete glottal closure.  Aspirate entered the larynx posteriorly over arytenoid cartilages.  Honey-thick liquids, purees, and soft solids were consumed with adequate oral control, mild delay in initiation of swallow, and consistent airway protection.  Pt may begin a dysphagia 3 diet with honey-thick liquids.  Recommend full supervision due to impulsivity leading to fast rate/large boluses; meds should be crushed in puree.  Cortrak can be D/Cd.  Pt will benefit from ongoing SLP services at SNF level. Educated pt/wife re: results and recs.  Impact on safety and function Mild aspiration risk   CHL IP TREATMENT RECOMMENDATION 06/16/2016 Treatment Recommendations Other (Comment)    Prognosis 06/11/2016 Prognosis for Safe Diet Advancement Good Barriers to Reach Goals -- Barriers/Prognosis Comment -- CHL IP DIET RECOMMENDATION 06/16/2016 SLP Diet Recommendations Dysphagia 3 (Mech soft) solids;Honey thick liquids Liquid Administration via Cup Medication Administration Crushed with puree Compensations Minimize environmental distractions;Slow rate;Small sips/bites Postural Changes Remain semi-upright after after feeds/meals (Comment)   CHL IP OTHER RECOMMENDATIONS 06/16/2016 Recommended Consults -- Oral Care Recommendations Oral care BID Other Recommendations Order thickener from pharmacy   CHL IP FOLLOW UP RECOMMENDATIONS 06/16/2016 Follow up Recommendations [facility] FREQUENC[facility] DURATION 06/12/2016 Speech Therapy Frequency (ACUTE ONLY) min 3x week Treatment Duration --      CHL IP ORAL PHASE 06/16/2016 Oral Phase WFL Oral - Pudding Teaspoon -- Oral - Pudding Cup -- Oral - Honey Teaspoon -- Oral - Honey Cup -- Oral - Nectar Teaspoon -- Oral - Nectar Cup -- Oral - Nectar Straw -- Oral - Thin Teaspoon -- Oral - Thin Cup -- Oral - Thin Straw -- Oral - Puree -- Oral - Mech Soft -- Oral - Regular -- Oral - Multi-Consistency -- Oral - Pill -- Oral Phase - Comment --  CHL IP PHARYNGEAL PHASE 06/16/2016 Pharyngeal Phase Impaired Pharyngeal- Pudding Teaspoon -- Pharyngeal -- Pharyngeal- Pudding Cup -- Pharyngeal -- Pharyngeal- Honey Teaspoon Delayed swallow initiation-vallecula Pharyngeal Material does not enter airway Pharyngeal- Honey Cup Delayed swallow initiation-vallecula Pharyngeal -- Pharyngeal- Nectar Teaspoon -- Pharyngeal -- Pharyngeal- Nectar Cup Delayed swallow initiation-vallecula;Reduced airway/laryngeal closure;Penetration/Aspiration during swallow;Moderate aspiration Pharyngeal Material enters airway, passes BELOW cords without attempt by patient to eject out (silent aspiration) Pharyngeal- Nectar Straw -- Pharyngeal -- Pharyngeal- Thin Teaspoon NT Pharyngeal -- Pharyngeal-  Thin Cup -- Pharyngeal -- Pharyngeal- Thin Straw -- Pharyngeal -- Pharyngeal- Puree Delayed swallow initiation-vallecula Pharyngeal Material does not enter airway Pharyngeal- Mechanical Soft WFL Pharyngeal -- Pharyngeal- Regular -- Pharyngeal -- Pharyngeal- Multi-consistency -- Pharyngeal -- Pharyngeal- Pill -- Pharyngeal -- Pharyngeal Comment --  CHL IP CERVICAL ESOPHAGEAL PHASE 06/08/2016 Cervical Esophageal Phase Impaired Pudding Teaspoon -- Pudding Cup -- Honey Teaspoon Reduced cricopharyngeal relaxation Honey Cup -- Nectar Teaspoon -- Nectar Cup -- Nectar Straw -- Thin Teaspoon Reduced cricopharyngeal relaxation Thin Cup -- Thin Straw -- Puree Reduced cricopharyngeal relaxation Mechanical Soft -- Regular -- Multi-consistency -- Pill -- Cervical Esophageal Comment  appears 2/2 reduced laryngeal elevation CHL IP GO 06/04/2016 Functional Assessment Tool Used NOMS Functional Limitations Swallowing Swallow Current Status (Z6109) CM Swallow Goal Status (U0454) CL Swallow Discharge Status (U9811) (None) Motor Speech Current Status (B1478) (None) Motor Speech Goal Status (G9562) (None) Motor Speech Goal Status (Z3086) (None) Spoken Language Comprehension Current Status (V7846) (None) Spoken Language Comprehension Goal Status (N6295) (None) Spoken Language Comprehension Discharge Status [phone]) (None) Spoken Language Expression Current Status [phone]) (None) Spoken Language Expression Goal Status [phone]) (None) Spoken Language Expression Discharge Status [phone]) (None) Attention Current Status (I3474) (None) Attention Goal Status (Q5956) (None) Attention Discharge Status [phone]) (None) Memory Current Status (E3329) (None) Memory Goal Status (J1884) (None) Memory Discharge Status (Z6606) (None) Voice Current Status (T0160) (None) Voice Goal Status (F0932) (None) Voice Discharge Status (T5573) (None) Other Speech-Language Pathology Functional Limitation [phone]) (None) Other Speech-Language Pathology Functional Limitation Goal  Status (K2706) (None) Other Speech-Language Pathology Functional Limitation Discharge Status [phone]) (None) Blenda Mounts Laurice 06/16/2016, 3:17 PM               Cardiac Studies     Patient Profile     51 y.o. males/p cardiac arrest.  Slow progress.    Assessment & Plan    1. Cardiac arrest:  -S/P STEMI with DES to OM2 05/07/2016 -on 05/24/16 Found by family pulseless and CPR inititated wothin about a minute. CPR for about 10 minutes. AED shock in the field. Cardiac arrest was thought to VT/VF. Pt had recent STEMI and dilated CM. He was taken back tot he cath lab and found to have possible edge dissection with thrombus in the circ, treated with overlapping DES. He was placed on cooling protocol.  -No ekg STEMI, Troponin mas 0.48 -It is felt that the arrest was more related to VT/VF than to subsequent MI and a Life Vest has been planned. The patient is a candidate for defibrillator implant without waiting the 90 days post event, however, with his neurologic status, he is not yet ready to undergo defibrillator placement.  Speech pathology is following his vocal cord edema and dysfunction   Dennis Bast to teach wife life vest today   2. Anoxicencephalopathy/injury: Slow improvement  Dr. Jomarie Longs is now assuming care for the patient and is working through these issues.   3. CAD:  -Early onset with myocardial infarction at age 33 (1986), managed medically due to collateral formation; severe ischemic cardiomyopathy due to diffuse coronary disease, then STEMI of left circumflex coronary artery in January 2018, with recurrent revascularization following the cardiac arrest. Would keep on lifelong dual antiplatelet therapy  4. CHF, systolic:  -EF 25-30% per echo on 05/15/2016 -Appears euvolemic, Intake was increased with tube feedings and he is up 456 ml for last 24h. Weight appears to be down or at least stable (down 40 pounds this admission) -Diuretic has been restarted. Electrolytes  and kidney function stable.     will sign off ok to d/c to SNF will arrange outpatient f/u with EP and Dr Allyson Sabal    Signed, Charlton Haws, MD  06/17/2016, 7:50 AM

## 2016-06-17 NOTE — Progress Notes (Signed)
Speech Language Pathology Treatment: Dysphagia  Patient Details Name: Douglas SermonsClifford M Villa MRN: [id number] DOB: 04/30/1966 Today's Date: 06/17/2016 Time: [id number] SLP Time Calculation (min) (ACUTE ONLY): 15 min  Assessment / Plan / Recommendation Clinical Impression  F/u after yesterday's MBS.  Pt eating lunch with assist from wife.  Had one likely aspiration event with explosive coughing after large, consecutive boluses of honey-thick liquid.  Reiterated absolute necessity of pt eating slowly, allowing time between each bite/sip.  His impulsivity (due to hypoxia) is primary issue negatively affecting safety.  Video of MBS retrieved from computer with pt, wife and son.  Reviewed results and rationale for honey-thickened liquids.  Family verbalizes understanding/ pt will need full supervision with meals to maintain safety.     HPI HPI: [name] is a 51 yo male with PMH of CAD (recently admitted from 1/3-1/7 for MI, had PCI of Om2), HFrEF (EF 35%), DMII (on insulin), HLD and carotid disease. He was discharged home from that admission, but returned shortly there after with 8lb weight gain, worsening dyspnea and decreased UOP despite full compliance with his outpatient diuretic. His BNP was 610 and weight was increased from 239-246lbs. Admitted with severe ischemic cardiomyopathy, presenting with quickly resuscitated VT/VF cardiac arrest and requiring repeat PCI to left circumflex roughly 2 weeks after initial presentation with STEMI. Anoxic brain injury: MRI 06/02/16 showed Stable left thalamus 4 mm acute infarction. Stable few scattered chronic cortical infarcts over the convexities bilaterally.  ETT 1/20-1/28; 1/29-1/31.  Traumatic intubation during code.  FEES 2/7 revealed significant edema throughout larynx with poor glottal closure, leading to aspiration.        SLP Plan  Discharge SLP treatment due to (comment) (d/c to snf)     Recommendations  Diet recommendations: Dysphagia 3 (mechanical  soft);Honey-thick liquid Liquids provided via: Cup Medication Administration: Crushed with puree Supervision: Patient able to self feed;Full supervision/cueing for compensatory strategies Compensations: Minimize environmental distractions;Slow rate;Small sips/bites Postural Changes and/or Swallow Maneuvers: Seated upright 90 degrees                Follow up Recommendations: Skilled Nursing facility Plan: Discharge SLP treatment due to (comment) (d/c to snf)       GO                [name], Savaya Hakes Laurice 06/17/2016, 1:16 PM

## 2016-06-17 NOTE — Progress Notes (Signed)
Occupational Therapy Treatment Patient Details Name: Douglas SermonsClifford M Carr MRN: [id number] DOB: 08/07/1965 Today's Date: 06/17/2016    History of present illness Pt is a 51 y/o male admitted secondary to STEMI at home with seizures. He is s/p PCI to circumflex. Pt also with an anoxic brain injury. Of note, pt recently admitted on 05/07/16-05/11/16 for an MI. PMH including but not limited to CHF, HTN, CAD, DM and PVD. Fitted for life vest 2/13.   OT comments  Pt sitting up in chair.  He was eager to work with OT, however was limited due to dizziness, LOB, and near syncope with efforts to stand.  BP 106/62, unable to get a reading while standing due to need to sit.   Pt reports fatigue after working with PT earlier.   Wife very supportive.  Recommend SNF level rehab at discharge.   Follow Up Recommendations  SNF;Supervision/Assistance - 24 hour    Equipment Recommendations  None recommended by OT    Recommendations for Other Services      Precautions / Restrictions Precautions Precautions: Fall Precaution Comments: watch BP Restrictions Weight Bearing Restrictions: No       Mobility Bed Mobility Overal bed mobility: Needs Assistance Bed Mobility: Rolling;Sidelying to Sit Rolling: Supervision Sidelying to sit: HOB elevated;Min assist       General bed mobility comments: up in chair   Transfers Overall transfer level: Needs assistance Equipment used: Rolling walker (2 wheeled) Transfers: Sit to/from Stand Sit to Stand: Min assist;Mod assist         General transfer comment: Pt limited by dizziness and near syncopal episode when standing     Balance Overall balance assessment: Needs assistance Sitting-balance support: Feet supported;Single extremity supported Sitting balance-Leahy Scale: Fair     Standing balance support: During functional activity;Bilateral upper extremity supported Standing balance-Leahy Scale: Poor Standing balance comment: BUE support on RW and Mod -  max A for standing balance. Truncal sway and ataxia present in standing.  Standing limited by dizziness, decreased responsiveness and need to sit                    ADL Overall ADL's : Needs assistance/impaired Eating/Feeding: Set up;Supervision/ safety;Sitting   Grooming: Wash/dry face;Wash/dry hands;Oral care;Brushing hair;Set up;Supervision/safety;Sitting   Upper Body Bathing: Supervision/ safety;Set up;Sitting   Lower Body Bathing: Maximal assistance;Sit to/from stand Lower Body Bathing Details (indicate cue type and reason): Pt requires max A due to significant LOB when standing.  He is able to wash feet, and Legs with min A  Upper Body Dressing : Set up;Supervision/safety;Sitting   Lower Body Dressing: Maximal assistance;Sit to/from stand Lower Body Dressing Details (indicate cue type and reason): able to don socks with min A.  Requires max  A for standing balance due to significant LOB  Toilet Transfer: Maximal assistance;Stand-pivot;BSC Toilet Transfer Details (indicate cue type and reason): unable to attempt transfer due to dizziness and decreased responsiveness while standing.  BP 106/62 - unable to get reading while pt standing.  Responsiveness improved once pt seated and feet elevated  Toileting- Clothing Manipulation and Hygiene: Total assistance;Sit to/from stand       Functional mobility during ADLs: Moderate assistance;Maximal assistance;Rolling walker General ADL Comments: limited by likely orthostasis while standing       Vision                     Perception     Praxis      Cognition   Behavior During  Therapy: Impulsive;WFL for tasks assessed/performed Overall Cognitive Status: Impaired/Different from baseline Area of Impairment: Safety/judgement;Problem solving;Attention   Current Attention Level: Selective    Following Commands: Follows multi-step commands with increased time Safety/Judgement: Decreased awareness of safety;Decreased  awareness of deficits   Problem Solving: Slow processing;Decreased initiation;Difficulty sequencing;Requires verbal cues;Requires tactile cues General Comments: Pt slow to process info and slow to initiate.  Mod cues for problem solving and safey     Extremity/Trunk Assessment               Exercises     Shoulder Instructions       General Comments      Pertinent Vitals/ Pain       Pain Assessment: No/denies pain  Home Living                                          Prior Functioning/Environment              Frequency  Min 3X/week        Progress Toward Goals  OT Goals(current goals can now be found in the care plan section)  Progress towards OT goals: Progressing toward goals     Plan Discharge plan remains appropriate    Co-evaluation                 End of Session Equipment Utilized During Treatment: Gait belt   Activity Tolerance Other (comment) (near syncope, dizziness, and LOB )   Patient Left in chair;with call bell/phone within reach;with family/visitor present   Nurse Communication Mobility status;Other (comment) (symptoms with standing )        Time: 1610-9604 OT Time Calculation (min): 19 min  Charges: OT General Charges $OT Visit: 1 Procedure OT Treatments $Therapeutic Activity: 8-22 mins  Felicidad Sugarman M 06/17/2016, 11:10 AM

## 2016-06-17 NOTE — Progress Notes (Addendum)
Physical Therapy Treatment Patient Details Name: Douglas SermonsClifford M Carr MRN: [id number] DOB: [birth year] Today's Date: 06/17/2016    History of Present Illness Pt is a 51 y/o male admitted secondary to STEMI at home with seizures. He is s/p PCI to circumflex. Pt also with an anoxic brain injury. Of note, pt recently admitted on 05/07/16-05/11/16 for an MI. PMH including but not limited to CHF, HTN, CAD, DM and PVD. Fitted for life vest 2/13.    PT Comments    Patient progressing well towards PT goals. Continues to be limited by dizziness and hypotension with changes in position and with activity. Supine BP 111/56, Sitting BP 101/61, sitting post transfer to chair BP 123/62. BP did not register post standing. Continues to demonstrate impulsivity, impaired initiation and ataxia putting pt at increased risk for falls. Highly motivated to maximize independence. Initiated gait training today with min A for balance/safety. Will continue to follow.   Follow Up Recommendations  SNF;Supervision for mobility/OOB     Equipment Recommendations  Other (comment) (deferred to next venue)    Recommendations for Other Services       Precautions / Restrictions Precautions Precautions: Fall Precaution Comments: watch BP Restrictions Weight Bearing Restrictions: No    Mobility  Bed Mobility Overal bed mobility: Needs Assistance Bed Mobility: Rolling;Sidelying to Sit Rolling: Supervision Sidelying to sit: HOB elevated;Min assist       General bed mobility comments: Increased time to get to EOB; assist to elevate trunk. + dizziness.  Transfers Overall transfer level: Needs assistance Equipment used: Rolling walker (2 wheeled) Transfers: Sit to/from Stand Sit to Stand: Min assist;From elevated surface         General transfer comment: Assist to stand from EOB with pt pulling up on RW. Ataxic and impulsive. + dizziness worsened in standing. Not able to get BP reading. transferred to chair post  ambulation.  Ambulation/Gait Ambulation/Gait assistance: Min assist Ambulation Distance (Feet): 10 Feet Assistive device: Rolling walker (2 wheeled) Gait Pattern/deviations: Step-through pattern;Decreased stride length;Ataxic;Narrow base of support;Trunk flexed Gait velocity: impulsive   General Gait Details: Impulsive gait with ataxic like pattern and assist for RW management and balance. + dizziness resulting in need to sit. Bp 123/62 in chair.   Stairs            Wheelchair Mobility    Modified Rankin (Stroke Patients Only) Modified Rankin (Stroke Patients Only) Pre-Morbid Rankin Score: No symptoms Modified Rankin: Moderately severe disability     Balance Overall balance assessment: Needs assistance Sitting-balance support: Feet supported;Single extremity supported Sitting balance-Leahy Scale: Fair Sitting balance - Comments: Able to sit EOB with 1 UE support; able to reach outside BoS and assist donning sock with LOB anteriorly requiring assist to correct. Truncal ataxic noted.    Standing balance support: During functional activity;Bilateral upper extremity supported Standing balance-Leahy Scale: Poor Standing balance comment: BUE support on RW and Min A for standing balance. Truncal sway and ataxia present in standing. marching ins tanding ~15 sec but dizziness resulted in need to sit.                    Cognition Arousal/Alertness: Awake/alert Behavior During Therapy: Impulsive;WFL for tasks assessed/performed Overall Cognitive Status: Impaired/Different from baseline Area of Impairment: Safety/judgement;Problem solving       Following Commands: Follows multi-step commands with increased time Safety/Judgement: Decreased awareness of safety;Decreased awareness of deficits   Problem Solving: Slow processing;Decreased initiation;Difficulty sequencing;Requires verbal cues;Requires tactile cues General Comments: Able to state correct date, place  and time  today. Able to state dog's names.    Exercises      General Comments General comments (skin integrity, edema, etc.): Wife present towards end of session.      Pertinent Vitals/Pain Pain Assessment: No/denies pain    Home Living                      Prior Function            PT Goals (current goals can now be found in the care plan section) Progress towards PT goals: Progressing toward goals    Frequency    Min 3X/week      PT Plan Current plan remains appropriate    Co-evaluation             End of Session Equipment Utilized During Treatment: Gait belt Activity Tolerance: Patient tolerated treatment well;Treatment limited secondary to medical complications (Comment) (dizziness) Patient left: in chair;with call bell/phone within reach;with family/visitor present     Time: 1610-9604 PT Time Calculation (min) (ACUTE ONLY): 24 min  Charges:  $Gait Training: 8-22 mins $Therapeutic Activity: 8-22 mins                    G Codes:      Lora Chavers A Keiran Sias 06/17/2016, 10:20 AM Mylo Red, PT, DPT [phone]

## 2016-06-18 ENCOUNTER — Telehealth: Payer: Self-pay | Admitting: Cardiovascular Disease

## 2016-06-18 NOTE — Telephone Encounter (Signed)
Called and spoke with patient's wife.  Let her know the second garment will come in the mail

## 2016-06-18 NOTE — Telephone Encounter (Signed)
New Message:    Please call,question about the life vest he just started wearing.

## 2016-06-25 ENCOUNTER — Other Ambulatory Visit: Payer: Self-pay | Admitting: Cardiology

## 2016-06-25 ENCOUNTER — Encounter (INDEPENDENT_AMBULATORY_CARE_PROVIDER_SITE_OTHER): Payer: Self-pay

## 2016-06-25 ENCOUNTER — Ambulatory Visit (INDEPENDENT_AMBULATORY_CARE_PROVIDER_SITE_OTHER): Payer: BC Managed Care – PPO | Admitting: Cardiology

## 2016-06-25 VITALS — Ht 69.0 in | Wt 210.0 lb

## 2016-06-25 DIAGNOSIS — Z01812 Encounter for preprocedural laboratory examination: Secondary | ICD-10-CM

## 2016-06-25 DIAGNOSIS — I5022 Chronic systolic (congestive) heart failure: Secondary | ICD-10-CM | POA: Diagnosis not present

## 2016-06-25 DIAGNOSIS — I469 Cardiac arrest, cause unspecified: Secondary | ICD-10-CM | POA: Diagnosis not present

## 2016-06-25 DIAGNOSIS — I255 Ischemic cardiomyopathy: Secondary | ICD-10-CM

## 2016-06-25 LAB — CBC
HEMATOCRIT: 34.9 % — AB (ref 37.5–51.0)
Hemoglobin: 11.3 g/dL — ABNORMAL LOW (ref 13.0–17.7)
MCH: 28 pg (ref 26.6–33.0)
MCHC: 32.4 g/dL (ref 31.5–35.7)
MCV: 87 fL (ref 79–97)
Platelets: 170 10*3/uL (ref 150–379)
RBC: 4.03 x10E6/uL — AB (ref 4.14–5.80)
RDW: 14.9 % (ref 12.3–15.4)
WBC: 7.4 10*3/uL (ref 3.4–10.8)

## 2016-06-25 LAB — BASIC METABOLIC PANEL
BUN/Creatinine Ratio: 17 (ref 9–20)
BUN: 16 mg/dL (ref 6–24)
CO2: 23 mmol/L (ref 18–29)
CREATININE: 0.94 mg/dL (ref 0.76–1.27)
Calcium: 8.9 mg/dL (ref 8.7–10.2)
Chloride: 97 mmol/L (ref 96–106)
GFR calc Af Amer: 110 (ref >59)
GFR calc non Af Amer: 95 (ref >59)
GLUCOSE: 149 mg/dL — AB (ref 65–99)
Potassium: 4.6 mmol/L (ref 3.5–5.2)
Sodium: 138 mmol/L (ref 134–144)

## 2016-06-25 NOTE — Progress Notes (Signed)
Electrophysiology Office Note   Date:  06/25/2016   ID:  Douglas Carr, DOB [birth year], MRN [id number]  PCP:  [name], MD  Cardiologist:  Allyson Sabal Primary Electrophysiologist:  Helayne Metsker Jorja Loa, MD    Chief Complaint  Patient presents with  . Advice Only    Discuss ICD     History of Present Illness: Douglas Carr is a 51 y.o. male who presents today for electrophysiology evaluation.   Admitted in early January after a STEMI with PCI to the OM 2 on 05/11/16. He then re-presented to the hospital with cardiac arrest and ventricular fibrillation when he was found pulseless by his family. CPR was initiated within 1 minute. He had an AED shock in the field. He was taken back to the cath lab and was found to have an edge dissection with thrombus in the circumflex treated with overlapping drug-eluting stents. He was placed on the cooling protocol. It was thought at the time that his arrest was caused by VT VF and not a myocardial infarction. Due to that, it was thought that a defibrillator would be indicated. His hospital course was complicated by anoxic encephalopathy. It was thought that he had anoxic brain injury from a cardiac arrest causing a left thalamic stroke.   Today, he denies symptoms of palpitations, chest pain, orthopnea, PND, lower extremity edema, claudication, dizziness, presyncope, syncope, bleeding, or neurologic sequela. The patient is tolerating medications without difficulties and is otherwise without complaint today. He continues to have deficits from his cardiac arrest in the form of weakness, fatigue, shortness of breath, and memory issues. Per his family, he cannot remember much January. He continues to get short of breath with minimal exertion. He does say that he has been improving since discharge from the hospital.   Past Medical History:  Diagnosis Date  . Acute ST elevation myocardial infarction (STEMI) involving left circumflex coronary artery  (HCC) 05/07/2016   PCI to Cx-OM  . Anginal pain (HCC)    secondary to sm. vessel disease  . Anxiety   . Arthritis    BIL KNEE PAIN AND BIL ANKLE PAIN  . Bone spur of ankle   . Cardiac arrest (HCC) 05/24/2016   with v fib  . 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 Coronary artery disease involving native coronary artery of native heart with angina pectoris (HCC) 05/24/2016   Remote MI at 51 years of age, Last cath 2010-diffuse non-obstructive disease; last echo 06/16/08 -normal LV function, moderate concentric hypertrophy; nuc 08/2008 no ischemia;  medical therapy; STEMI May 07 2016 - PCI to Cx-OM  . Diabetes mellitus    ON ORAL MEDICATION AND INSULIN  . Dilated cardiomyopathy (HCC) 05/24/2016   EF 325-30% by Echo post STEMI (previously 30-35%)   . Hyperlipidemia   . Hypertension   . Myocardial infarction 1996   Post MI  . Peripheral vascular disease (HCC)    HAS LEFT CAROTID ARTERY STENOSIS   AND IS S/P RIGHT CAROTID ENDARTERECTOMY 2010 Last carotid dopplers 01/08/2012 wth patent endarterectomy site  . Status post coronary artery stent placement    Past Surgical History:  Procedure Laterality Date  . APPENDECTOMY    . CARDIAC CATHETERIZATION     FEB 2010, significant branch vessel disease wth diag, marginal, PDA & PLA, nml. LV function  . CARDIAC CATHETERIZATION N/A 05/07/2016   Procedure: Left Heart Cath and Coronary Angiography;  Surgeon: Peter M Swaziland, MD;  Location: [facility] INVASIVE CV LAB;  Service: Cardiovascular;  Laterality: N/A;  . CARDIAC CATHETERIZATION N/A 05/07/2016   Procedure: Coronary Stent Intervention;  Surgeon: Peter M Swaziland, MD;  Location: [facility] INVASIVE CV LAB;  Service: Cardiovascular;  Laterality: N/A;  . CARDIAC CATHETERIZATION N/A 05/24/2016   Procedure: Left Heart Cath and Coronary Angiography;  Surgeon: Marykay Lex, MD;  Location: [facility] INVASIVE CV LAB;  Service: Cardiovascular;  Laterality: N/A;  . CARDIAC  CATHETERIZATION N/A 05/24/2016   Procedure: Coronary Stent Intervention;  Surgeon: Marykay Lex, MD;  Location: [facility] INVASIVE CV LAB;  Service: Cardiovascular;  Laterality: N/A;  2.5x20 Promus to Ostial/proximal circumflex  . CAROTID ENDARTERECTOMY  09/2008   Rt CEA  . LUNG BIOPSY  2013   Bx's suggest granulomatous dz.  . RT ANKLE   2013     Current Outpatient Prescriptions  Medication Sig Dispense Refill  . acetaminophen (TYLENOL) 325 MG tablet Take 2 tablets (650 mg total) by mouth every 4 (four) hours as needed for headache or mild pain.    Marland Kitchen aspirin 81 MG chewable tablet Chew 1 tablet (81 mg total) by mouth daily.    Marland Kitchen atorvastatin (LIPITOR) 80 MG tablet Take 1 tablet (80 mg total) by mouth daily at 6 PM. 90 tablet 3  . carvedilol (COREG) 6.25 MG tablet Take 1 tablet (6.25 mg total) by mouth 2 (two) times daily with a meal. 180 tablet 3  . clopidogrel (PLAVIX) 75 MG tablet Take 1 tablet (75 mg total) by mouth daily.    . insulin aspart (NOVOLOG FLEXPEN) 100 UNIT/ML FlexPen Inject 6 to 10 units 3 times a day prior to each meal and as needed for blood sugar greater than 200 (Patient taking differently: Inject 0-10 Units into the skin See admin instructions. Three times a day prior to each meal as needed for blood sugar greater than 200) 15 pen 3  . insulin glargine (LANTUS) 100 UNIT/ML injection Inject 0.2 mLs (20 Units total) into the skin at bedtime.    . levETIRAcetam (KEPPRA) 100 MG/ML solution Take 10 mLs (1,000 mg total) by mouth 2 (two) times daily.    Marland Kitchen lisinopril (PRINIVIL,ZESTRIL) 2.5 MG tablet Take 2.5 mg by mouth daily.    . polyethylene glycol (MIRALAX / GLYCOLAX) packet Take 17 g by mouth daily as needed.  0  . potassium chloride SA (K-DUR,KLOR-CON) 20 MEQ tablet Take 1 tablet (20 mEq total) by mouth daily. 90 tablet 3  . tamsulosin (FLOMAX) 0.4 MG CAPS capsule Take 1 capsule (0.4 mg total) by mouth daily.    Marland Kitchen torsemide (DEMADEX) 20 MG tablet Take 1 tablet (20 mg total) by  mouth daily.    . TRESIBA FLEXTOUCH 200 UNIT/ML SOPN Inject 160 Units as directed every morning.     No current facility-administered medications for this visit.     Allergies:   Patient has no known allergies.   Social History:  The patient  reports that he quit smoking about 4 years ago. His smoking use included Cigarettes. He has a 44.00 pack-year smoking history. He has never used smokeless tobacco. He reports that he drinks alcohol. He reports that he does not use drugs.   Family History:  The patient's family history includes Diabetes in his mother; Heart attack in his paternal grandfather; Heart attack (age of onset: 58) in his father; Hypertension in his maternal grandfather and mother.    ROS:  Please see the history of present illness.   Otherwise, review of systems is  positive for none.   All other systems are reviewed and negative.    PHYSICAL EXAM: VS:  Ht  (1.753 m)   Wt 210 lb (95.3 kg)   BMI 31.01 kg/m  , BMI Body mass index is 31.01 kg/m. GEN: Well nourished, well developed, in no acute distress  HEENT: normal  Neck: no JVD, carotid bruits, or masses Cardiac: RRR; no murmurs, rubs, or gallops,no edema  Respiratory:  clear to auscultation bilaterally, normal work of breathing GI: soft, nontender, nondistended, + BS MS: no deformity or atrophy  Skin: warm and dry Neuro:  Strength and sensation are intact Psych: euthymic mood, full affect  EKG:  EKG is not ordered today. Personal review of the ekg ordered shows sinus tachycardia, IVCD, rate 124  Recent Labs: 05/07/2016: TSH 3.503 05/12/2016: B Natriuretic Peptide 610.1 05/27/2016: ALT 84 06/06/2016: Magnesium 2.4 06/17/2016: BUN 12; Creatinine, Ser 0.90; Hemoglobin 10.8; Platelets 204; Potassium 4.4; Sodium 141    Lipid Panel     Component Value Date/Time   CHOL 109 05/24/2016 1537   CHOL 117 05/24/2016 0822   TRIG 184 (H) 06/08/2016 1434   HDL 23 (L) 05/24/2016 1537   HDL 27 (L) 05/24/2016 0822    CHOLHDL 4.7 05/24/2016 1537   VLDL 28 05/24/2016 1537   LDLCALC 58 05/24/2016 1537   LDLCALC 65 05/24/2016 0822     Wt Readings from Last 3 Encounters:  06/25/16 210 lb (95.3 kg)  06/17/16 204 lb (92.5 kg)  05/23/16 234 lb (106.1 kg)      Other studies Reviewed: Additional studies/ records that were reviewed today include: Cath 05/24/16, TTE 05/15/16  Review of the above records today demonstrates:   Ost Cx to Mid Cx lesion, 70 %stenosed leading into OM2 as the main trunk of the Circumflex.  Ost 2nd Mrg to 2nd Mrg recent Promus DES 2.5 x 24 stent, - focal 90 %stenosed with what appears to be proximal edge dissection with thrombus  A STENT PROMUS PREM MR 2.5X20 drug eluting stent was successfully placed from Ostium of Circumflex into OM2, and overlaps previously placed stent.  Post intervention, there is a 0% residual stenosis.  ____________________________________________________  Suezanne Jacquet LAD to Prox LAD lesion, 30 %stenosed.  Ost 1st Diag to 1st Diag lesion, 70 %stenosed.  Ost 1st Mrg to 1st Mrg lesion, 35 %stenosed. Ost 2nd Diag to 2nd Diag lesion, 50 %stenosed.  Prox RCA to Mid RCA lesion, 65 %stenosed - lesion appears similar to prior cath, with mild progression.  Very distal RPDA lesion, 100 %stenosed.  LV end diastolic pressure is severely elevated.  There is no aortic valve stenosis.    - Left ventricle: The cavity size was moderately dilated. Wall   thickness was normal. Systolic function was severely reduced. The   estimated ejection fraction was in the range of 25% to 30%.   Akinesis and scarring of the anteroseptal and anterior   myocardium; consistent with infarction in the distribution of the   left anterior descending coronary artery. Dyskinesis of the   apicalanterior myocardium. Features are consistent with a   pseudonormal left ventricular filling pattern, with concomitant   abnormal relaxation and increased filling pressure (grade 2   diastolic  dysfunction). No evidence of thrombus. - Left atrium: The atrium was mildly dilated. - Atrial septum: No defect or patent foramen ovale was identified.  ASSESSMENT AND PLAN:  1.  Ischemic cardiomyopathy: MI in January 2018. Continuing to have symptoms of shortness of breath and fatigue, and is  at a rehabilitation center trying to recover from his cardiac arrest. He is on optimal medical therapy with Coreg and lisinopril, and is taking Plavix and aspirin. Continue these medications.  2. Essential hypertension: His blood pressure is low today. He is on optimal therapy for his heart failure. Andre Gallego not make any further adjustments.  3. VT/VF arrest: Had cardiac arrest 2 weeks after his myocardial infarction. He did have a second stent placed post cardiac arrest, but with TIMI 2 flow, it was not felt that his arrest was due to a repeat infarction. He would therefore benefit from ICD placement. Risks and benefits of defibrillator placement were discussed. Risks include bleeding, tamponade, infection, and pneumothorax, among others. He understands these risks and has agreed to the procedure.  4. Hyperlipidemia: Continue Lipitor    Current medicines are reviewed at length with the patient today.   The patient does not have concerns regarding his medicines.  The following changes were made today:  none  Labs/ tests ordered today include:  Orders Placed This Encounter  Procedures  . Basic Metabolic Panel (BMET)  . CBC     Disposition:   FU with Muranda Coye 3 months  Signed, Zebadiah Willert Jorja Loa, MD  06/25/2016 9:36 AM     [facility] [address] [place] [address] [phone] (office) [phone] (fax)

## 2016-06-25 NOTE — Patient Instructions (Addendum)
Medication Instructions:  Your physician recommends that you continue on your current medications as directed. Please refer to the Current Medication list given to you today.   Labwork: TODAY BMET / CBC  Testing/Procedures: ICD Implant  Follow-Up: Follow-up for a wound check in the [facility] 10-14 days from 06/30/16  and with Dr. Elberta Fortisamnitz  91 days from 06/30/16.   Any Other Special Instructions Will Be Listed Below ---  Please wash with the CHG Soap the night before and morning of procedure (follow instruction page "Preparing For Surgery").   [facility] 06[facility]30/16 at 9:30 AM  Nothing to eat or drink after midnight the night before procedure  Do not take any medication the morning of the procedure  Plan 1 night stay        Cardioverter Defibrillator Implantation An implantable cardioverter defibrillator (ICD) is a small, lightweight, battery-powered device that is placed (implanted) under the skin in the chest or abdomen. Your caregiver may prescribe an ICD if:  You have had an irregular heart rhythm (arrhythmia) that originated in the lower chambers of the heart (ventricles).  Your heart has been damaged by a disease (such as coronary artery disease) or heart condition (such as a heart attack). An ICD consists of a battery that lasts several years, a small computer called a pulse generator, and wires called leads that go into the heart. It is used to detect and correct two dangerous arrhythmias: a rapid heart rhythm (tachycardia) and an arrhythmia in which the ventricles contract in an uncoordinated way (fibrillation). When an ICD detects tachycardia, it sends an electrical signal to the heart that restores the heartbeat to normal (cardioversion). This signal is usually painless. If cardioversion does not work or if the ICD detects fibrillation, it delivers a small electrical shock to the heart (defibrillation) to restart the  heart. The shock may feel like a strong jolt in the chest.ICDs may be programmed to correct other problems. Sometimes, ICDs are programmed to act as another type of implantable device called a pacemaker. Pacemakers are used to treat a slow heartbeat (bradycardia). What are the risks? Generally, the procedure to implant an ICD is safe. However, as with any surgical procedure, complications can occur. Possible complications associated with implanting an ICD include:  Swelling, bleeding, or bruising at the site where the ICD was implanted.  Infection at the site where the ICD was implanted.  A reaction to medicine used during the procedure.  Nerve, heart, or blood vessel damage.  Blood clots. What happens before the procedure?  You may need to have blood tests, heart tests, or a chest X-ray done before the day of the procedure.  Ask your caregiver about changing or stopping your regular medicines.  Make plans to have someone drive you home. You may need to stay in the hospital overnight after the procedure.  Stop smoking at least 24 hours before the procedure.  Take a bath or shower the night before the procedure. You may need to scrub your chest or abdomen with a special type of soap.  Do not eat or drink before your procedure for as long as directed by your caregiver. Ask if it is okay to take any needed medicine with a small sip of water. What happens during the procedure? The procedure to implant an ICD in your chest or abdomen is usually done at a hospital in a room that has a large X-ray machine called a  fluoroscope. The machine will be above you during the procedure. It will help your caregiver see your heart during the procedure. Implanting an ICD usually takes 1-3 hours. Before the procedure:  Small monitors will be put on your body. They will be used to check your heart, blood pressure, and oxygen level.  A needle will be put into a vein in your hand or arm. This is called an  intravenous (IV) access tube. Fluids and medicine will flow directly into your body through the IV tube.  Your chest or abdomen will be cleaned with a germ-killing (antiseptic) solution. The area may be shaved.  You may be given medicine to help you relax (sedative).  You will be given a medicine called a local anesthetic. This medicine will make the surgical site numb while the ICD is implanted. You will be sleepy but awake during the procedure. After you are numb the procedure will begin. The caregiver will:  Make a small cut (incision). This will make a pocket deep under your skin that will hold the pulse generator.  Guide the leads through a large blood vessel into your heart and attach them to the heart muscles. Depending on the ICD, the leads may go into one ventricle or they may go to both ventricles and into an upper chamber of the heart (atrium).  Test the ICD.  Close the incision with stitches, glue, or staples. What happens after the procedure?  You may feel pain. Some pain is normal. It may last a few days.  You may stay in a recovery area until the local anesthetic has worn off. Your blood pressure and pulse will be checked often. You will be taken to a room where your heart will be monitored.  A chest X-ray will be taken. This is done to check that the cardioverter defibrillator is in the right place.  You may stay in the hospital overnight.  A slight bump may be seen over the skin where the ICD was placed. Sometimes, it is possible to feel the ICD under the skin. This is normal.  In the months and years afterward, your caregiver will check the device, the leads, and the battery every few months. Eventually, when the battery is low, the ICD will be replaced. Contact a health care provider if:  Any allergies you have.  All medicines you are taking, including vitamins, herbs, eyedrops, and over-the-counter medicines and creams.  Previous problems you or members of your  family have had with the use of anesthetics.  Any blood disorders you have had.  Other health problems you have. This information is not intended to replace advice given to you by your health care provider. Make sure you discuss any questions you have with your health care provider. Document Released: 01/11/2002 Document Revised: 09/27/2015 Document Reviewed: 05/10/2012 Elsevier Interactive Patient Education  2017 [employer].

## 2016-06-30 ENCOUNTER — Ambulatory Visit (HOSPITAL_COMMUNITY)
Admission: RE | Admit: 2016-06-30 | Discharge: 2016-07-02 | Disposition: A | Discharge status: Skilled Nursing Facility | Payer: BC Managed Care – PPO | Source: Ambulatory Visit | Attending: Cardiology | Admitting: Cardiology

## 2016-06-30 ENCOUNTER — Encounter (HOSPITAL_COMMUNITY)
Admission: RE | Disposition: A | Discharge status: Skilled Nursing Facility | Payer: Self-pay | Source: Ambulatory Visit | Attending: Cardiology

## 2016-06-30 ENCOUNTER — Encounter (HOSPITAL_COMMUNITY): Payer: Self-pay | Admitting: Cardiology

## 2016-06-30 DIAGNOSIS — Z955 Presence of coronary angioplasty implant and graft: Secondary | ICD-10-CM | POA: Insufficient documentation

## 2016-06-30 DIAGNOSIS — Z006 Encounter for examination for normal comparison and control in clinical research program: Secondary | ICD-10-CM | POA: Diagnosis not present

## 2016-06-30 DIAGNOSIS — I5042 Chronic combined systolic (congestive) and diastolic (congestive) heart failure: Secondary | ICD-10-CM | POA: Diagnosis not present

## 2016-06-30 DIAGNOSIS — Z7902 Long term (current) use of antithrombotics/antiplatelets: Secondary | ICD-10-CM | POA: Insufficient documentation

## 2016-06-30 DIAGNOSIS — I110 Hypertensive heart disease with heart failure: Secondary | ICD-10-CM | POA: Diagnosis not present

## 2016-06-30 DIAGNOSIS — Z95818 Presence of other cardiac implants and grafts: Secondary | ICD-10-CM

## 2016-06-30 DIAGNOSIS — F419 Anxiety disorder, unspecified: Secondary | ICD-10-CM | POA: Diagnosis not present

## 2016-06-30 DIAGNOSIS — I420 Dilated cardiomyopathy: Secondary | ICD-10-CM | POA: Diagnosis not present

## 2016-06-30 DIAGNOSIS — I252 Old myocardial infarction: Secondary | ICD-10-CM | POA: Diagnosis not present

## 2016-06-30 DIAGNOSIS — Z7982 Long term (current) use of aspirin: Secondary | ICD-10-CM | POA: Insufficient documentation

## 2016-06-30 DIAGNOSIS — I2510 Atherosclerotic heart disease of native coronary artery without angina pectoris: Secondary | ICD-10-CM | POA: Diagnosis not present

## 2016-06-30 DIAGNOSIS — I255 Ischemic cardiomyopathy: Secondary | ICD-10-CM

## 2016-06-30 DIAGNOSIS — E785 Hyperlipidemia, unspecified: Secondary | ICD-10-CM | POA: Diagnosis not present

## 2016-06-30 DIAGNOSIS — Z87891 Personal history of nicotine dependence: Secondary | ICD-10-CM | POA: Diagnosis not present

## 2016-06-30 DIAGNOSIS — Z794 Long term (current) use of insulin: Secondary | ICD-10-CM | POA: Insufficient documentation

## 2016-06-30 DIAGNOSIS — E1151 Type 2 diabetes mellitus with diabetic peripheral angiopathy without gangrene: Secondary | ICD-10-CM | POA: Diagnosis not present

## 2016-06-30 DIAGNOSIS — G259 Extrapyramidal and movement disorder, unspecified: Secondary | ICD-10-CM | POA: Diagnosis not present

## 2016-06-30 DIAGNOSIS — Z79899 Other long term (current) drug therapy: Secondary | ICD-10-CM | POA: Diagnosis not present

## 2016-06-30 HISTORY — PX: ICD IMPLANT: EP1208

## 2016-06-30 LAB — SURGICAL PCR SCREEN
MRSA, PCR: NEGATIVE
Staphylococcus aureus: POSITIVE — AB

## 2016-06-30 LAB — GLUCOSE, CAPILLARY
GLUCOSE-CAPILLARY: 66 mg/dL (ref 65–99)
GLUCOSE-CAPILLARY: 103 mg/dL — AB (ref 65–99)
GLUCOSE-CAPILLARY: 94 mg/dL (ref 65–99)
GLUCOSE-CAPILLARY: 215 mg/dL — AB (ref 65–99)
Glucose-Capillary: 80 mg/dL (ref 65–99)

## 2016-06-30 LAB — BASIC METABOLIC PANEL
Anion gap: 8 (ref 5–15)
BUN: 7 mg/dL (ref 6–20)
CHLORIDE: 104 mmol/L (ref 101–111)
CO2: 29 mmol/L (ref 22–32)
CREATININE: 0.84 mg/dL (ref 0.61–1.24)
Calcium: 8.5 mg/dL — ABNORMAL LOW (ref 8.9–10.3)
GFR calc non Af Amer: >60 mL/min (ref >60)
Glucose, Bld: 177 mg/dL — ABNORMAL HIGH (ref 65–99)
Potassium: 3.6 mmol/L (ref 3.5–5.1)
Sodium: 141 mmol/L (ref 135–145)

## 2016-06-30 SURGERY — ICD IMPLANT

## 2016-06-30 MED ORDER — POLYETHYLENE GLYCOL 3350 17 G PO PACK: 17.0000 g | PACK | Freq: Every day | ORAL | Status: DC | PRN

## 2016-06-30 MED ORDER — LEVETIRACETAM 100 MG/ML PO SOLN
1000.0000 mg | Freq: Two times a day (BID) | ORAL | Status: DC
  Administered 2016-06-30 – 2016-07-02 (×5): 1000 mg via ORAL
  Filled 2016-06-30 (×6): qty 10

## 2016-06-30 MED ORDER — ACETAMINOPHEN 325 MG PO TABS
650.0000 mg | ORAL_TABLET | ORAL | Status: DC | PRN
Start: 2016-06-30 — End: 2016-06-30

## 2016-06-30 MED ORDER — MIDAZOLAM HCL 5 MG/5ML IJ SOLN
INTRAMUSCULAR | Status: AC
  Filled 2016-06-30: qty 5

## 2016-06-30 MED ORDER — MUPIROCIN 2 % EX OINT: 1.0000 "application " | TOPICAL_OINTMENT | Freq: Once | CUTANEOUS | Status: DC

## 2016-06-30 MED ORDER — HEPARIN (PORCINE) IN NACL 2-0.9 UNIT/ML-% IJ SOLN
INTRAMUSCULAR | Status: AC
  Filled 2016-06-30: qty 500

## 2016-06-30 MED ORDER — ONDANSETRON HCL 4 MG/2ML IJ SOLN: 4.0000 mg | Freq: Four times a day (QID) | INTRAMUSCULAR | Status: DC | PRN

## 2016-06-30 MED ORDER — TAMSULOSIN HCL 0.4 MG PO CAPS
0.4000 mg | ORAL_CAPSULE | Freq: Every day | ORAL | Status: DC
  Administered 2016-06-30 – 2016-07-01 (×2): 0.4 mg via ORAL
  Filled 2016-06-30 (×2): qty 1

## 2016-06-30 MED ORDER — FENTANYL CITRATE (PF) 100 MCG/2ML IJ SOLN
INTRAMUSCULAR | Status: AC
  Filled 2016-06-30: qty 2

## 2016-06-30 MED ORDER — CEFAZOLIN IN D5W 1 GM/50ML IV SOLN
1.0000 g | Freq: Four times a day (QID) | INTRAVENOUS | Status: AC
  Administered 2016-06-30 – 2016-07-01 (×3): 1 g via INTRAVENOUS
  Filled 2016-06-30 (×3): qty 50

## 2016-06-30 MED ORDER — CLOPIDOGREL BISULFATE 75 MG PO TABS
75.0000 mg | ORAL_TABLET | Freq: Every evening | ORAL | Status: DC
  Administered 2016-06-30 – 2016-07-01 (×2): 75 mg via ORAL
  Filled 2016-06-30 (×2): qty 1

## 2016-06-30 MED ORDER — SODIUM CHLORIDE 0.9 % IV SOLN
INTRAVENOUS | Status: DC
  Administered 2016-06-30: 10:00:00 via INTRAVENOUS

## 2016-06-30 MED ORDER — INSULIN ASPART 100 UNIT/ML ~~LOC~~ SOLN: 0.0000 [IU] | Freq: Three times a day (TID) | SUBCUTANEOUS | Status: DC

## 2016-06-30 MED ORDER — FENTANYL CITRATE (PF) 100 MCG/2ML IJ SOLN
INTRAMUSCULAR | Status: DC | PRN
  Administered 2016-06-30 (×2): 25 ug via INTRAVENOUS

## 2016-06-30 MED ORDER — INSULIN GLARGINE 100 UNIT/ML ~~LOC~~ SOLN
20.0000 [IU] | Freq: Every day | SUBCUTANEOUS | Status: DC
  Administered 2016-06-30 – 2016-07-02 (×2): 20 [IU] via SUBCUTANEOUS
  Filled 2016-06-30 (×3): qty 0.2

## 2016-06-30 MED ORDER — LISINOPRIL 2.5 MG PO TABS
2.5000 mg | ORAL_TABLET | Freq: Every day | ORAL | Status: DC
  Administered 2016-06-30 – 2016-07-02 (×3): 2.5 mg via ORAL
  Filled 2016-06-30 (×3): qty 1

## 2016-06-30 MED ORDER — GENTAMICIN SULFATE 40 MG/ML IJ SOLN
80.0000 mg | INTRAMUSCULAR | Status: AC
  Administered 2016-06-30: 80 mg

## 2016-06-30 MED ORDER — POTASSIUM CHLORIDE CRYS ER 20 MEQ PO TBCR
20.0000 meq | EXTENDED_RELEASE_TABLET | Freq: Every day | ORAL | Status: DC
  Administered 2016-06-30 – 2016-07-02 (×3): 20 meq via ORAL
  Filled 2016-06-30 (×3): qty 1

## 2016-06-30 MED ORDER — INSULIN DEGLUDEC 200 UNIT/ML ~~LOC~~ SOPN: 160.0000 [IU] | PEN_INJECTOR | SUBCUTANEOUS | Status: DC

## 2016-06-30 MED ORDER — INSULIN ASPART 100 UNIT/ML FLEXPEN
0.0000 [IU] | PEN_INJECTOR | Freq: Three times a day (TID) | SUBCUTANEOUS | Status: DC
  Filled 2016-06-30: qty 3

## 2016-06-30 MED ORDER — LIDOCAINE HCL (PF) 1 % IJ SOLN
INTRAMUSCULAR | Status: AC
  Filled 2016-06-30: qty 60

## 2016-06-30 MED ORDER — TORSEMIDE 20 MG PO TABS
20.0000 mg | ORAL_TABLET | Freq: Every day | ORAL | Status: DC
  Administered 2016-06-30 – 2016-07-02 (×3): 20 mg via ORAL
  Filled 2016-06-30 (×3): qty 1

## 2016-06-30 MED ORDER — HEPARIN (PORCINE) IN NACL 2-0.9 UNIT/ML-% IJ SOLN
INTRAMUSCULAR | Status: DC | PRN
  Administered 2016-06-30: 500 mL

## 2016-06-30 MED ORDER — CEFAZOLIN SODIUM-DEXTROSE 2-4 GM/100ML-% IV SOLN
2.0000 g | INTRAVENOUS | Status: AC
  Administered 2016-06-30: 2 g via INTRAVENOUS

## 2016-06-30 MED ORDER — CEFAZOLIN SODIUM-DEXTROSE 2-4 GM/100ML-% IV SOLN
INTRAVENOUS | Status: AC
  Filled 2016-06-30: qty 100

## 2016-06-30 MED ORDER — ASPIRIN 81 MG PO CHEW
81.0000 mg | CHEWABLE_TABLET | Freq: Every day | ORAL | Status: DC
  Administered 2016-06-30 – 2016-07-02 (×3): 81 mg via ORAL
  Filled 2016-06-30 (×3): qty 1

## 2016-06-30 MED ORDER — ACETAMINOPHEN 325 MG PO TABS
325.0000 mg | ORAL_TABLET | ORAL | Status: DC | PRN
  Administered 2016-06-30: 650 mg via ORAL
  Filled 2016-06-30: qty 2

## 2016-06-30 MED ORDER — MIDAZOLAM HCL 5 MG/5ML IJ SOLN
INTRAMUSCULAR | Status: DC | PRN
  Administered 2016-06-30 (×2): 1 mg via INTRAVENOUS

## 2016-06-30 MED ORDER — MUPIROCIN 2 % EX OINT
TOPICAL_OINTMENT | CUTANEOUS | Status: AC
  Administered 2016-06-30: 10:00:00
  Filled 2016-06-30: qty 22

## 2016-06-30 MED ORDER — LIDOCAINE HCL (PF) 1 % IJ SOLN
INTRAMUSCULAR | Status: DC | PRN
  Administered 2016-06-30: 46 mL

## 2016-06-30 MED ORDER — CARVEDILOL 6.25 MG PO TABS
6.2500 mg | ORAL_TABLET | Freq: Two times a day (BID) | ORAL | Status: DC
  Administered 2016-06-30 – 2016-07-02 (×4): 6.25 mg via ORAL
  Filled 2016-06-30 (×4): qty 1

## 2016-06-30 MED ORDER — DEXTROSE 50 % IV SOLN
25.0000 mL | Freq: Once | INTRAVENOUS | Status: AC
  Administered 2016-06-30: 25 mL via INTRAVENOUS

## 2016-06-30 MED ORDER — GENTAMICIN SULFATE 40 MG/ML IJ SOLN
INTRAMUSCULAR | Status: AC
  Filled 2016-06-30: qty 2

## 2016-06-30 MED ORDER — DEXTROSE 50 % IV SOLN
INTRAVENOUS | Status: AC
  Administered 2016-06-30: 25 mL via INTRAVENOUS
  Filled 2016-06-30: qty 50

## 2016-06-30 MED ORDER — INSULIN GLARGINE 100 UNIT/ML ~~LOC~~ SOLN
160.0000 [IU] | Freq: Every day | SUBCUTANEOUS | Status: DC
  Filled 2016-06-30: qty 1.6

## 2016-06-30 MED ORDER — ATORVASTATIN CALCIUM 80 MG PO TABS
80.0000 mg | ORAL_TABLET | Freq: Every day | ORAL | Status: DC
  Administered 2016-06-30 – 2016-07-01 (×2): 80 mg via ORAL
  Filled 2016-06-30 (×2): qty 1

## 2016-06-30 MED ORDER — BUPIVACAINE HCL (PF) 0.25 % IJ SOLN: INTRAMUSCULAR | Status: DC | PRN

## 2016-06-30 SURGICAL SUPPLY — 7 items
CABLE SURGICAL S-101-97-12 (CABLE) ×1 IMPLANT
ICD VISIA MRI VR DVFB1D4 (ICD Generator) IMPLANT
LEAD SPRINT QUAT SEC 6935M-62 (Lead) ×1 IMPLANT
PAD DEFIB LIFELINK (PAD) ×1 IMPLANT
SHEATH CLASSIC 9F (SHEATH) ×1 IMPLANT
TRAY PACEMAKER INSERTION (PACKS) ×1 IMPLANT
VISIA MRI VR DVFB1D4 (ICD Generator) ×2 IMPLANT

## 2016-06-30 NOTE — Discharge Instructions (Signed)
° ° °  Supplemental Discharge Instructions for  Pacemaker/Defibrillator Patients  Activity No heavy lifting or vigorous activity with your left/right arm for 6 to 8 weeks.  Do not raise your left/right arm above your head for one week.  Gradually raise your affected arm as drawn below.           __        07/04/16                     07/05/16                       07/06/16                    07/07/16  NO DRIVING for 6 months  WOUND CARE - Keep the wound area clean and dry.  Do not get this area wet for one week. No showers for one week; you may shower on 07/07/16    . - The tape/steri-strips on your wound will fall off; do not pull them off.  No bandage is needed on the site.  DO  NOT apply any creams, oils, or ointments to the wound area. - If you notice any drainage or discharge from the wound, any swelling or bruising at the site, or you develop a fever > 101? F after you are discharged home, call the office at once.  Special Instructions - You are still able to use cellular telephones; use the ear opposite the side where you have your pacemaker/defibrillator.  Avoid carrying your cellular phone near your device. - When traveling through airports, show security personnel your identification card to avoid being screened in the metal detectors.  Ask the security personnel to use the hand wand. - Avoid arc welding equipment, TENS units (transcutaneous nerve stimulators).  Call the office for questions about other devices. - Avoid electrical appliances that are in poor condition or are not properly grounded. - Microwave ovens are safe to be near or to operate.

## 2016-06-30 NOTE — Discharge Summary (Addendum)
ELECTROPHYSIOLOGY PROCEDURE DISCHARGE SUMMARY    Patient ID: Douglas Carr,  MRN: [id number], DOB/AGE: October 15, 1965 51 y.o.  Admit date: 06/30/2016 Discharge date: 07/02/2016  Primary Care Physician: [name], MD Primary Cardiologist: Allyson Sabal Electrophysiologist: Elberta Fortis  Primary Discharge Diagnosis:  1.  VF arrest s/p ICD implant this admission  Secondary Discharge Diagnosis:  1.  Ischemic cardiomyopathy 2.  Chronic systolic heart failure 3.  CAD 4.  Hypertension 5.  Diabetes 6.  PVD  No Known Allergies   Procedures This Admission:  1.  Implantation of a MDT single chamber ICD on 06/30/16 by Dr Elberta Fortis. See op note for full details.  There were no immediate post procedure complications. 2.  CXR on 07/01/16 demonstrated no pneumothorax status post device implantation.   Brief HPI: Douglas Carr is a 51 y.o. male with a past medical history of CAD and ICM.  He presented after revascularization with resuscitated cardiac arrest. He was not felt to have recurrent MI at that time.  He has been wearing a LifeVest since. The patient has persistent LV dysfunction despite guideline directed therapy.  Risks, benefits, and alternatives to ICD implantation were reviewed with the patient who wished to proceed.   Hospital Course:  The patient was admitted and underwent implantation of a MDT single chamber ICD with details as outlined above. He was monitored on telemetry overnight which demonstrated sinus rhythm.  Left chest was without hematoma or ecchymosis.  The device was interrogated and found to be functioning normally.  CXR was obtained and demonstrated no pneumothorax status post device implantation.  Wound care, arm mobility, and restrictions were reviewed with the patient.  The patient was examined and considered stable for discharge to SNF.  Swallow eval was done with recommendations for ongoing speech therapy at SNF. He also has a tremor for which Dr Elberta Fortis would  like him referred to neurology. Katria Botts place referral.    No driving for 6 months post cardiac arrest.   The patient's discharge medications include an ACE-I (Lisinopril) and beta blocker (Coreg).    Physical Exam: Vitals:   07/01/16 1445 07/01/16 2023 07/01/16 2024 07/02/16 0441  BP: 110/70 108/60 108/60 (!) 110/53  Pulse:  77  75  Resp:  18  18  Temp:  98.7 F (37.1 C)  98.6 F (37 C)  TempSrc:  Oral  Oral  SpO2:  100%  100%  Weight:      Height:        Labs:   Lab Results  Component Value Date   WBC 7.4 06/25/2016   HGB 10.8 (L) 06/17/2016   HCT 34.9 (L) 06/25/2016   MCV 87 06/25/2016   PLT 170 06/25/2016     Recent Labs Lab 06/30/16 2235  NA 141  K 3.6  CL 104  CO2 29  BUN 7  CREATININE 0.84  CALCIUM 8.5*  GLUCOSE 177*    Discharge Medications:  Allergies as of 07/02/2016   No Known Allergies     Medication List    TAKE these medications   acetaminophen 325 MG tablet Commonly known as:  TYLENOL Take 2 tablets (650 mg total) by mouth every 4 (four) hours as needed for headache or mild pain.   aspirin 81 MG chewable tablet Chew 1 tablet (81 mg total) by mouth daily.   atorvastatin 80 MG tablet Commonly known as:  LIPITOR Take 1 tablet (80 mg total) by mouth daily at 6 PM.   carvedilol 6.25 MG tablet Commonly  known as:  COREG Take 1 tablet (6.25 mg total) by mouth 2 (two) times daily with a meal.   clonazePAM 0.5 MG tablet Commonly known as:  KLONOPIN Take 1 tablet (0.5 mg total) by mouth 2 (two) times daily.   clopidogrel 75 MG tablet Commonly known as:  PLAVIX Take 1 tablet (75 mg total) by mouth daily. What changed:  when to take this   insulin aspart 100 UNIT/ML FlexPen Commonly known as:  NOVOLOG FLEXPEN Inject 6 to 10 units 3 times a day prior to each meal and as needed for blood sugar greater than 200 What changed:  how much to take  how to take this  when to take this  additional instructions   insulin glargine 100  UNIT/ML injection Commonly known as:  LANTUS Inject 0.2 mLs (20 Units total) into the skin at bedtime.   levETIRAcetam 100 MG/ML solution Commonly known as:  KEPPRA Take 10 mLs (1,000 mg total) by mouth 2 (two) times daily.   lisinopril 2.5 MG tablet Commonly known as:  PRINIVIL,ZESTRIL Take 2.5 mg by mouth daily.   polyethylene glycol packet Commonly known as:  MIRALAX / GLYCOLAX Take 17 g by mouth daily as needed. What changed:  reasons to take this   potassium chloride SA 20 MEQ tablet Commonly known as:  K-DUR,KLOR-CON Take 1 tablet (20 mEq total) by mouth daily.   tamsulosin 0.4 MG Caps capsule Commonly known as:  FLOMAX Take 1 capsule (0.4 mg total) by mouth daily. What changed:  when to take this   torsemide 20 MG tablet Commonly known as:  DEMADEX Take 1 tablet (20 mg total) by mouth daily.   TRESIBA FLEXTOUCH 200 UNIT/ML Sopn Generic drug:  Insulin Degludec Inject 160 Units as directed every morning.       Disposition:  Discharge Instructions    Diet - low sodium heart healthy    Complete by:  As directed    Increase activity slowly    Complete by:  As directed      Follow-up Information    [facility] [address] [facility] Follow up on 07/16/2016.   Specialty:  Cardiology Why:  at [facility] for wound check  Contact information: [address], [address] [place] [id number] [phone]       Ashlee Bewley Jorja LoaMartin Janelle Culton, MD Follow up on 10/07/2016.   Specialty:  Cardiology Why:  at 3:30PM  Contact information: [address] [place] [address] [phone]           Duration of Discharge Encounter: Greater than 30 minutes including physician time.  Signed, Gypsy BalsamAmber Seiler, NP 07/02/2016 7:54 AM  I have seen and examined this patient with Gypsy BalsamAmber Seiler.  Agree with above, note added to reflect my findings.  On exam, regular rhythm, no murmurs, lungs clear. Has a history of ischemic cardiomyopathy and sudden cardiac death and  is now status post implant of a Medtronic single-chamber ICD. No complications post discharge. Chest x-ray and interrogation without major abnormality. Plan for discharge today.    Debanhi Blaker M. Aiesha Leland MD 07/02/2016 7:54 AM   Addendum:  He developed worsening tremor and was seen by neurology in consultation who recommended adding clonazepam.  Discharge medications updated. Ok to discharge home today per Dr Elberta Fortisamnitz.   Gypsy BalsamAmber Seiler, NP 07/02/2016 7:54 AM  Loman BrooklynWill Forest Pruden, MD 07/02/2016 7:54 AM

## 2016-06-30 NOTE — H&P (Signed)
Douglas SermonsClifford M Carr is a 51 y.o. male with a history of ischemic cardiomyopathy. He had a STEMI in January and presented to the hospital 2 weeks later with cardiac arrest. It was not thought that he had an MI at that second visit. He thus presents today for ICD implant. Risks and benefits discussed. Risks include but not limited to bleeding, infection, tamponade, pneumothorax. The patient understands the risks and has agreed to the procedure.  Will Elberta Fortisamnitz, MD 06/30/2016 10:17 AM

## 2016-07-01 ENCOUNTER — Other Ambulatory Visit: Payer: Self-pay | Admitting: *Deleted

## 2016-07-01 ENCOUNTER — Encounter (HOSPITAL_COMMUNITY): Payer: Self-pay | Admitting: *Deleted

## 2016-07-01 ENCOUNTER — Ambulatory Visit: Payer: BC Managed Care – PPO | Admitting: Cardiovascular Disease

## 2016-07-01 ENCOUNTER — Ambulatory Visit (HOSPITAL_COMMUNITY): Payer: BC Managed Care – PPO

## 2016-07-01 DIAGNOSIS — G253 Myoclonus: Secondary | ICD-10-CM

## 2016-07-01 DIAGNOSIS — I255 Ischemic cardiomyopathy: Secondary | ICD-10-CM | POA: Diagnosis not present

## 2016-07-01 DIAGNOSIS — Z006 Encounter for examination for normal comparison and control in clinical research program: Secondary | ICD-10-CM | POA: Diagnosis not present

## 2016-07-01 DIAGNOSIS — I5042 Chronic combined systolic (congestive) and diastolic (congestive) heart failure: Secondary | ICD-10-CM | POA: Diagnosis not present

## 2016-07-01 DIAGNOSIS — R251 Tremor, unspecified: Secondary | ICD-10-CM

## 2016-07-01 DIAGNOSIS — I2510 Atherosclerotic heart disease of native coronary artery without angina pectoris: Secondary | ICD-10-CM | POA: Diagnosis not present

## 2016-07-01 LAB — GLUCOSE, CAPILLARY
GLUCOSE-CAPILLARY: 116 mg/dL — AB (ref 65–99)
GLUCOSE-CAPILLARY: 145 mg/dL — AB (ref 65–99)
GLUCOSE-CAPILLARY: 108 mg/dL — AB (ref 65–99)

## 2016-07-01 MED ORDER — CLONAZEPAM 0.5 MG PO TABS
0.5000 mg | ORAL_TABLET | Freq: Two times a day (BID) | ORAL | Status: DC
  Administered 2016-07-01 – 2016-07-02 (×2): 0.5 mg via ORAL
  Filled 2016-07-01 (×2): qty 1

## 2016-07-01 NOTE — Progress Notes (Signed)
Patient continue to c/o of tremors automatic bp machine having trouble reading. Manual bp recorded at 110/70. No other changes at this time.  Douglas Carr, Charlaine DaltonAnn Brooke RN

## 2016-07-01 NOTE — Progress Notes (Signed)
ICD Criteria  Current LVEF:25-30%. Within 12 months prior to implant: Yes   Heart failure history: Yes, Class III  Cardiomyopathy history: Yes, Ischemic Cardiomyopathy - Prior MI.  Atrial Fibrillation/Atrial Flutter: No.  Ventricular tachycardia history: Yes, Hemodynamic instability present. VT Type is unknown.  Cardiac arrest history: Yes, Ventricular Fibrillation.  History of syndromes with risk of sudden death: No.  Previous ICD: No.  Current ICD indication: Secondary  PPM indication: No.   Class I or II Bradycardia indication present: No  Beta Blocker therapy for 3 or more months: No, medical reason. Recent ischemic cardiomyopathy Ace Inhibitor/ARB therapy for 3 or more months: No, medical reason. Recent ischemic cardiomyopathy

## 2016-07-01 NOTE — Consult Note (Signed)
Neurology Consultation Reason for Consult: Abnormal movements Referring Physician: Elberta Fortis, W  CC: Abnormal movements  History is obtained from: Patient, family  HPI: Douglas Carr is a 51 y.o. male with a history of cardiac arrest in January with question of post wrist seizure. He was on prolonged monitoring with multiple episodes of myoclonus observed without EEG correlate. 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 Following his procedure yesterday, however, he has had a markedly worsening tremor. He states that this is worse than anything seen before.    ROS: A 14 point ROS was performed and is negative except as noted in the HPI.   Past Medical History:  Diagnosis Date  . Acute ST elevation myocardial infarction (STEMI) involving left circumflex coronary artery (HCC) 05/07/2016   PCI to Cx-OM  . Anginal pain (HCC)    secondary to sm. vessel disease  . Anxiety   . Arthritis    BIL KNEE PAIN AND BIL ANKLE PAIN  . Bone spur of ankle   . Cardiac arrest (HCC) 05/24/2016   with v fib  . 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 Coronary artery disease involving native coronary artery of native heart with angina pectoris (HCC) 05/24/2016   Remote MI at 51 years of age, Last cath 2010-diffuse non-obstructive disease; last echo 06/16/08 -normal LV function, moderate concentric hypertrophy; nuc 08/2008 no ischemia;  medical therapy; STEMI May 07 2016 - PCI to Cx-OM  . Diabetes mellitus    ON ORAL MEDICATION AND INSULIN  . Dilated cardiomyopathy (HCC) 05/24/2016   EF 325-30% by Echo post STEMI (previously 30-35%)   . Hyperlipidemia   . Hypertension   . Myocardial infarction 1996   Post MI  . Peripheral vascular disease (HCC)    HAS LEFT CAROTID ARTERY STENOSIS   AND IS S/P RIGHT CAROTID ENDARTERECTOMY 2010 Last carotid dopplers 01/08/2012 wth patent endarterectomy site  . Status  post coronary artery stent placement      Family History  Problem Relation Age of Onset  . Hypertension Mother   . Diabetes Mother   . Hypertension Maternal Grandfather   . Heart attack Paternal Grandfather   . Heart attack Father 66     Social History:  reports that he quit smoking about 4 years ago. His smoking use included Cigarettes. He has a 44.00 pack-year smoking history. He has never used smokeless tobacco. He reports that he drinks alcohol. He reports that he does not use drugs.   Exam: Current vital signs: BP 110/70 Comment: retook bp manually patient having tremors and bp hard to get  Pulse 85   Temp 98.6 F (37 C) (Oral)   Resp 18   Ht 5\' 9"  (1.753 m)   Wt 95.3 kg (210 lb)   SpO2 100%   BMI 31.01 kg/m  Vital signs in last 24 hours: Temp:  [98.2 F (36.8 C)-98.6 F (37 C)] 98.6 F (37 C) (02/27 1429) Pulse Rate:  [74-87] 85 (02/27 1429) Resp:  [18] 18 (02/27 1429) BP: (110-210)/(65-112) 110/70 (02/27 1445) SpO2:  [98 %-100 %] 100 % (02/27 1429)   Physical Exam  Constitutional: Appears well-developed and well-nourished.  Psych: Affect appropriate to situation Eyes: No scleral injection HENT: No OP obstrucion Head: Normocephalic.  Cardiovascular: Normal rate and regular rhythm.  Respiratory: Effort normal and breath sounds normal to anterior ascultation GI: Soft.  No distension. There is no  tenderness.  Skin: WDI  Neuro: Mental Status: Patient is awake, alert, oriented to person, place, month, year, and situation. Patient is able to give a clear and coherent history. No signs of aphasia or neglect Cranial Nerves: II: Visual Fields are full. Pupils are very slightly asymmetric, round, and equally reactive to light.   III,IV, VI: EOMI without ptosis or diploplia.  V: Facial sensation is symmetric to temperature VII: Facial movement is symmetric.  VIII: hearing is intact to voice X: Uvula elevates symmetrically XI: Shoulder shrug is  symmetric. XII: tongue is midline without atrophy or fasciculations.  Motor: Tone is normal. Bulk is normal. 5/5 strength was present in all four extremities.  Sensory: Sensation is symmetric to light touch and temperature in the arms and legs. Cerebellar:  HKS  intact bilaterally, FNF intact on the right, limited by pain on the left  He has frequent low amplitude by myoclonus at rest versus resting tremor. This abates with intention, though he does have some action myoclonus as well but this appears to be separate. 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 I suspect this represents an unmasking of his underlying neurological condition due to physiological stressor (surgical procedure).  Benzodiazepines are sometimes useful in this setting. 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 I would start relatively low-dose clonazepam, and I'm not sure that this would need to be a long-term medication is once his physiological stress is over, could wean this off.  Recommendations: 1) clonazepam 0.5 mg twice a day 2) continue Keppra 1 g twice a day   Ritta SlotMcNeill Aashir Umholtz, MD [facility] [phone]  If 7pm- 7am, please page neurology on call as listed in AMION.

## 2016-07-01 NOTE — Progress Notes (Signed)
Pts family reports increased tremors and "shaking" all over.  They are concerned about returning to rehab. Will ask neurology to consult inpatient. If no new recs, can be discharged later today  Gypsy BalsamAmber Seiler, NP 07/01/2016 1:51 PM

## 2016-07-01 NOTE — Progress Notes (Signed)
Clinical Social Worker met patient at bedside to offer support and discuss patients needs at discharge. Patients family was at bedside and stated once all labs have been finished for patient they would like to d/c back to [facility]. CSW contacted Admission Coordinator Juliann Pulse and Juliann Pulse stated she will look into patient and start the authorization through his insurance.   Rhea Pink, MSW,  [place]

## 2016-07-01 NOTE — Procedures (Signed)
Objective Swallowing Evaluation: Type of Study: FEES-Fiberoptic Endoscopic Evaluation of Swallow  Patient Details  Name: Douglas Carr MRN: [id number] Date of Birth: [birth year]  Today's Date: 07/01/2016 Time: SLP Start Time (ACUTE ONLY): 1015-SLP Stop Time (ACUTE ONLY): 1100 SLP Time Calculation (min) (ACUTE ONLY): 45 min  Past Medical History:  Past Medical History:  Diagnosis Date  . Acute ST elevation myocardial infarction (STEMI) involving left circumflex coronary artery (HCC) 05/07/2016   PCI to Cx-OM  . Anginal pain (HCC)    secondary to sm. vessel disease  . Anxiety   . Arthritis    BIL KNEE PAIN AND BIL ANKLE PAIN  . Bone spur of ankle   . Cardiac arrest (HCC) 05/24/2016   with v fib  . 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 Coronary artery disease involving native coronary artery of native heart with angina pectoris (HCC) 05/24/2016   Remote MI at 51 years of age, Last cath 2010-diffuse non-obstructive disease; last echo 06/16/08 -normal LV function, moderate concentric hypertrophy; nuc 08/2008 no ischemia;  medical therapy; STEMI May 07 2016 - PCI to Cx-OM  . Diabetes mellitus    ON ORAL MEDICATION AND INSULIN  . Dilated cardiomyopathy (HCC) 05/24/2016   EF 325-30% by Echo post STEMI (previously 30-35%)   . Hyperlipidemia   . Hypertension   . Myocardial infarction 1996   Post MI  . Peripheral vascular disease (HCC)    HAS LEFT CAROTID ARTERY STENOSIS   AND IS S/P RIGHT CAROTID ENDARTERECTOMY 2010 Last carotid dopplers 01/08/2012 wth patent endarterectomy site  . Status post coronary artery stent placement    Past Surgical History:  Past Surgical History:  Procedure Laterality Date  . APPENDECTOMY    . CARDIAC CATHETERIZATION     FEB 2010, significant branch vessel disease wth diag, marginal, PDA & PLA, nml. LV function  . CARDIAC CATHETERIZATION N/A 05/07/2016   Procedure: Left Heart Cath and Coronary  Angiography;  Surgeon: Peter M Swaziland, MD;  Location: [facility] INVASIVE CV LAB;  Service: Cardiovascular;  Laterality: N/A;  . CARDIAC CATHETERIZATION N/A 05/07/2016   Procedure: Coronary Stent Intervention;  Surgeon: Peter M Swaziland, MD;  Location: [facility] INVASIVE CV LAB;  Service: Cardiovascular;  Laterality: N/A;  . CARDIAC CATHETERIZATION N/A 05/24/2016   Procedure: Left Heart Cath and Coronary Angiography;  Surgeon: Marykay Lex, MD;  Location: [facility] INVASIVE CV LAB;  Service: Cardiovascular;  Laterality: N/A;  . CARDIAC CATHETERIZATION N/A 05/24/2016   Procedure: Coronary Stent Intervention;  Surgeon: Marykay Lex, MD;  Location: [facility] INVASIVE CV LAB;  Service: Cardiovascular;  Laterality: N/A;  2.5x20 Promus to Ostial/proximal circumflex  . CAROTID ENDARTERECTOMY  09/2008   Rt CEA  . ICD IMPLANT N/A 06/30/2016   Procedure: ICD Implant;  Surgeon: Will Jorja Loa, MD;  Location: [facility] INVASIVE CV LAB;  Service: Cardiovascular;  Laterality: N/A;  . LUNG BIOPSY  2013   Bx's suggest granulomatous dz.  . RT ANKLE   2013   HPI: 51 y.o.male presents to [facility] for ICD implant. Pt with a history of ischemic cardiomyopathy. He had a STEMI in January and presented to the hospital two weeks later with cardiac arrest.  Traumatic intubation during code.  ETT 1/20-1/28; 1/29-1/31; MRI 06/02/16 showed Stable left thalamus 4 mm acute infarction. Stable few scattered chronic cortical infarcts over the convexities bilaterally.  Pt with anoxic brain injury.  Underwent multiple swallow studies during admission.  FEES 2/7 revealed significant  edema throughout larynx with poor glottal closure, leading to aspiration.  MBS 2/12 with silent aspiration nectar-thick liquids. Honey-thick liquids, purees, and soft solids were consumed with adequate oral control, mild delay in initiation of swallow, and consistent airway protection. Recs were to begin a dysphagia 3 diet with honey-thick liquids; full supervision due to impulsivity leading to  fast rate/large boluses; meds crushed in puree.  Pt for repeat FEES [facility].   Subjective: alert, cooperative   Assessment / Plan / Recommendation  CHL IP CLINICAL IMPRESSIONS 07/01/2016  Clinical Impression Pt presents with a very mild pharyngeal dysphagia related primarily to mild base-of-tongue weakness, leading to minimal solid food residuals in valleculae.  There is also trace penetration of thin liquids to the anterior commissure of the vocal cords.  This is cleared easily by a spontaneous cough/throat clear.  No aspiration observed.  Pt with excellent airway protection.  Larynx is c/b minimal inflammation, no erythema.  Good vocal fold mobility bilaterally.  Recommend advancing diet to regular consistencies; thin liquids.  Pt should consume liquids in single sips (straw or cup); avoid consecutive, large boluses.  Throat-clearing/cough are protective in nature.  Pt and wife reviewed FEES video and we compared function with 06/12/15 study.  Results/recommendations discussed and they agree.    SLP Visit Diagnosis Dysphagia, pharyngeal phase (R13.13)  Attention and concentration deficit following --  Frontal lobe and executive function deficit following --  Impact on safety and function Mild aspiration risk      CHL IP TREATMENT RECOMMENDATION 07/01/2016  Treatment Recommendations Defer treatment plan to f/u with SLP     Prognosis 06/11/2016  Prognosis for Safe Diet Advancement Good  Barriers to Reach Goals --  Barriers/Prognosis Comment --    CHL IP DIET RECOMMENDATION 07/01/2016  SLP Diet Recommendations Regular solids;Thin liquid  Liquid Administration via Cup;Straw  Medication Administration Whole meds with puree  Compensations Small sips/bites  Postural Changes --      CHL IP OTHER RECOMMENDATIONS 07/01/2016  Recommended Consults --  Oral Care Recommendations Oral care BID  Other Recommendations --      CHL IP FOLLOW UP RECOMMENDATIONS 06/17/2016  Follow up  Recommendations Skilled Nursing facility      [facility] D[facility] 06/12/2016  Speech Therapy Frequency (ACUTE ONLY) min 3x week  Treatment Duration --           CHL IP ORAL PHASE 07/01/2016  Oral Phase WFL  Oral - Pudding Teaspoon --  Oral - Pudding Cup --  Oral - Honey Teaspoon --  Oral - Honey Cup --  Oral - Nectar Teaspoon --  Oral - Nectar Cup --  Oral - Nectar Straw --  Oral - Thin Teaspoon --  Oral - Thin Cup --  Oral - Thin Straw --  Oral - Puree --  Oral - Mech Soft --  Oral - Regular --  Oral - Multi-Consistency --  Oral - Pill --  Oral Phase - Comment --    CHL IP PHARYNGEAL PHASE 07/01/2016  Pharyngeal Phase Impaired  Pharyngeal- Pudding Teaspoon --  Pharyngeal --  Pharyngeal- Pudding Cup --  Pharyngeal --  Pharyngeal- Honey Teaspoon NT  Pharyngeal --  Pharyngeal- Honey Cup --  Pharyngeal --  Pharyngeal- Nectar Teaspoon --  Pharyngeal --  Pharyngeal- Nectar Cup WFL  Pharyngeal --  Pharyngeal- Nectar Straw --  Pharyngeal --  Pharyngeal- Thin Teaspoon WFL  Pharyngeal --  Pharyngeal- Thin Cup --  Pharyngeal --  Pharyngeal- Thin Straw Penetration/Aspiration  during swallow  Pharyngeal Material enters airway, CONTACTS cords and then ejected out  Pharyngeal- Puree WFL  Pharyngeal --  Pharyngeal- Mechanical Soft Pharyngeal residue - valleculae;Reduced tongue base retraction  Pharyngeal --  Pharyngeal- Regular --  Pharyngeal --  Pharyngeal- Multi-consistency --  Pharyngeal --  Pharyngeal- Pill --  Pharyngeal --  Pharyngeal Comment --     CHL IP CERVICAL ESOPHAGEAL PHASE 06/08/2016  Cervical Esophageal Phase Impaired  Pudding Teaspoon --  Pudding Cup --  Honey Teaspoon Reduced cricopharyngeal relaxation  Honey Cup --  Nectar Teaspoon --  Nectar Cup --  Nectar Straw --  Thin Teaspoon Reduced cricopharyngeal relaxation  Thin Cup --  Thin Straw --  Puree Reduced cricopharyngeal relaxation  Mechanical Soft --  Regular --   Multi-consistency --  Pill --  Cervical Esophageal Comment appears 2/2 reduced laryngeal elevation    CHL IP GO 07/01/2016  Functional Assessment Tool Used clinical judgment  Functional Limitations Swallowing  Swallow Current Status (Z6109) CI  Swallow Goal Status (U0454) CI  Swallow Discharge Status (U9811) CI  Motor Speech Current Status (B1478) (None)  Motor Speech Goal Status (G9562) (None)  Motor Speech Goal Status (Z3086) (None)  Spoken Language Comprehension Current Status (V7846) (None)  Spoken Language Comprehension Goal Status (N6295) (None)  Spoken Language Comprehension Discharge Status (M8413) (None)  Spoken Language Expression Current Status (K4401) (None)  Spoken Language Expression Goal Status (U2725) (None)  Spoken Language Expression Discharge Status (D6644) (None)  Attention Current Status (I3474) (None)  Attention Goal Status (Q5956) (None)  Attention Discharge Status (L8756) (None)  Memory Current Status (E3329) (None)  Memory Goal Status (J1884) (None)  Memory Discharge Status (Z6606) (None)  Voice Current Status (T0160) (None)  Voice Goal Status (F0932) (None)  Voice Discharge Status (T5573) (None)  Other Speech-Language Pathology Functional Limitation (U2025) (None)  Other Speech-Language Pathology Functional Limitation Goal Status (K2706) (None)  Other Speech-Language Pathology Functional Limitation Discharge Status [phone]) (None)    Blenda Mounts Laurice 07/01/2016, 11:32 AM

## 2016-07-02 DIAGNOSIS — I255 Ischemic cardiomyopathy: Secondary | ICD-10-CM | POA: Diagnosis not present

## 2016-07-02 LAB — GLUCOSE, CAPILLARY
GLUCOSE-CAPILLARY: 157 mg/dL — AB (ref 65–99)
GLUCOSE-CAPILLARY: 95 mg/dL (ref 65–99)
Glucose-Capillary: 167 mg/dL — ABNORMAL HIGH (ref 65–99)

## 2016-07-02 MED ORDER — CLONAZEPAM 0.5 MG PO TABS: 0.5000 mg | ORAL_TABLET | Freq: Two times a day (BID) | ORAL | 0 refills | Status: DC

## 2016-07-02 NOTE — Progress Notes (Signed)
Clinical Social Worker facilitated patient discharge including contacting patient family and facility to confirm patient discharge plans.  Clinical information faxed to facility and family agreeable with plan.  Patients family stated they will transport patient to facility.  RN Lissa HoardHelena to call [phone] for report prior to discharge.  Clinical Social Worker will sign off for now as social work intervention is no longer needed. Please consult us again if new need arises.  Marrianne MoodAshley Shahzaib Azevedo, MSW, [employer]WA [phone]

## 2016-07-02 NOTE — Progress Notes (Signed)
[facility]. AVS and prescriptions given to patient, all questions answered, report called to [place] at facility. Patient stable at time of discharge

## 2016-07-02 NOTE — Progress Notes (Signed)
Doing well today Neuro consult reviewed Will add clonazepam to discharge orders - I will write for 3 days of medication, he will need to have refilled by SNF MD's or neurology.  Otherwise, see discharge summary from yesterday  Gypsy BalsamAmber Seiler, NP 07/02/2016 7:52 AM

## 2016-07-08 ENCOUNTER — Encounter: Payer: Self-pay | Admitting: Cardiovascular Disease

## 2016-07-08 ENCOUNTER — Ambulatory Visit (INDEPENDENT_AMBULATORY_CARE_PROVIDER_SITE_OTHER): Payer: BC Managed Care – PPO | Admitting: Cardiovascular Disease

## 2016-07-08 VITALS — BP 124/72 | HR 86 | Ht 69.0 in | Wt 220.8 lb

## 2016-07-08 DIAGNOSIS — I2121 ST elevation (STEMI) myocardial infarction involving left circumflex coronary artery: Secondary | ICD-10-CM

## 2016-07-08 DIAGNOSIS — E785 Hyperlipidemia, unspecified: Secondary | ICD-10-CM

## 2016-07-08 DIAGNOSIS — E782 Mixed hyperlipidemia: Secondary | ICD-10-CM | POA: Diagnosis not present

## 2016-07-08 DIAGNOSIS — I10 Essential (primary) hypertension: Secondary | ICD-10-CM

## 2016-07-08 DIAGNOSIS — I5023 Acute on chronic systolic (congestive) heart failure: Secondary | ICD-10-CM

## 2016-07-08 DIAGNOSIS — I469 Cardiac arrest, cause unspecified: Secondary | ICD-10-CM | POA: Diagnosis not present

## 2016-07-08 DIAGNOSIS — E1169 Type 2 diabetes mellitus with other specified complication: Secondary | ICD-10-CM

## 2016-07-08 DIAGNOSIS — I255 Ischemic cardiomyopathy: Secondary | ICD-10-CM

## 2016-07-08 NOTE — Assessment & Plan Note (Signed)
History of remote myocardial infarction age 51 with cardiac catheterization performed by Dr. Alanda AmassWeintraub revealing what sounds like an occluded vessel with collaterals. Intervention was not performed. He had a STEMI on 05/11/16 and was taken to the Cath Lab by Dr. SwazilandJordan where a stent was placed in the second obtuse marginal branch. His EF was found to be depressed in the 30% range. He had a witnessed cardiac arrest on 05/24/16 secondary to ventricular fibrillation. Cardiac catheterization revealed a 90% stenosis at the origin of the second obtuse marginal branch. Dr. Herbie BaltimoreHarding placed a stent in the AV groove extending into the origin of the second obtuse marginal branch with excellent result. He did have residual 60% mid dominant RCA with occluded PDA distally was likely related to his old infarct at age 51. He remains on dual antiplatelet therapy.

## 2016-07-08 NOTE — Assessment & Plan Note (Signed)
The patient was discharged on 05/11/16 after his STEMI and readmitted the following day with volume overload. He was diuresed. His EF was in the 30% range. He is on optimal medical therapy at this time.

## 2016-07-08 NOTE — Assessment & Plan Note (Signed)
History of essential hypertension blood pressure measured today at 124/72. He is on carvedilol and lisinopril. Continue current meds at current dosing

## 2016-07-08 NOTE — Assessment & Plan Note (Signed)
History of hyperlipidemia on high-dose statin therapy. We will recheck a lipid liver profile in 3 months.

## 2016-07-08 NOTE — Assessment & Plan Note (Signed)
[name] had witnessed cardiac arrest and bystander CPR by his 8969 year old son. He underwent urgent intervention by Dr. Herbie BaltimoreHarding with stenting of the circumflex obtuse marginal branch. He was intubated sedated and cooled under the [employer] Protocol. Ultimately he regained consciousness and has been rehabilitation since. He is now alert, oriented, and ambulatory without significant neurologic deficit.

## 2016-07-08 NOTE — Patient Instructions (Signed)
Medication Instructions: Your physician recommends that you continue on your current medications as directed. Please refer to the Current Medication list given to you today.   Labwork: Your physician recommends that you return for a FASTING lipid profile and hepatic function panel in 3 months.   Follow-Up: Your physician recommends that you schedule a follow-up appointment in: 3 months with Dr. Allyson SabalBerry after lab work.  If you need a refill on your cardiac medications before your next appointment, please call your pharmacy.

## 2016-07-08 NOTE — Assessment & Plan Note (Signed)
History of ischemic cardiomyopathy status post witnessed VF arrest with persistent LV dysfunction. He recently underwent ICD implantation by Dr. Elberta Fortisamnitz on 06/30/16 for secondary prevention.

## 2016-07-08 NOTE — Progress Notes (Signed)
07/08/2016 Douglas Carr   Sep 06, 1965  [id number]  Primary Physician MILLER, Bertram Millard, MD Primary Cardiologist: Runell Gess MD Roseanne Reno  HPI:  Douglas Carr is a 51 year old mildly overweight married Caucasian male father of 3, and father of one grandchild is accompanied by his wife Douglas Carr today. I apparently took care of him remotely. He did have a myocardial infarction at age 42 and underwent cardiac catheterization by Dr. Alanda Amass revealing occluded vessel with collaterals. Medical therapy was recommended. I apparently cathed him in 2010 without significant findings. The symptoms include treated hypertension, hyperlipidemia and diabetes. He worked as a Merchandiser, retail for [employer]. He did have a remote right carotid endarterectomy performed approximately 10 years ago. Carotid Doppler performed recently on 06/09/16 showed acute thrombus of the origin of the right common carotid artery throughout the visualized portion of the ICA.  Douglas Carr had a STEMI on 05/07/16 and had a Promus struggling stent placed by Dr. Swaziland in the second obtuse marginal branch. He was discharged home 4 days later and readmitted the following day with volume overload. EF was 30%. He was diuresed and sent home after that. He was readmitted on 05/24/16 with a witnessed cardiac arrest and bystander CPR performed by history 33 year old son. He had fairly rapid R OSC and underwent emergency cardiac catheterization by Dr. Herbie Baltimore revealing a 90% stenosis at the origin of the previous stented vessel with 70% diffuse proximal AV groove circumflex stenosis which was stented by Dr. Herbie Baltimore. He had a prolonged hospital course with intubation sedation and systemic hypothermia. He was seen by palliative care. Ultimately he regained consciousness and neurologic function. He has been in rehabilitation since and is alert, oriented, conversant and ambulatory. He underwent ICD implantation by Dr. Elberta Fortis on  06/30/16 for secondary prevention.   Current Outpatient Prescriptions  Medication Sig Dispense Refill  . acetaminophen (TYLENOL) 325 MG tablet Take 2 tablets (650 mg total) by mouth every 4 (four) hours as needed for headache or mild pain.    Marland Kitchen aspirin 81 MG chewable tablet Chew 1 tablet (81 mg total) by mouth daily.    Marland Kitchen atorvastatin (LIPITOR) 80 MG tablet Take 1 tablet (80 mg total) by mouth daily at 6 PM. 90 tablet 3  . carvedilol (COREG) 6.25 MG tablet Take 1 tablet (6.25 mg total) by mouth 2 (two) times daily with a meal. 180 tablet 3  . clonazePAM (KLONOPIN) 0.5 MG tablet Take 1 tablet (0.5 mg total) by mouth 2 (two) times daily. 10 tablet 0  . clopidogrel (PLAVIX) 75 MG tablet Take 1 tablet (75 mg total) by mouth daily. (Patient taking differently: Take 75 mg by mouth every evening. )    . insulin aspart (NOVOLOG FLEXPEN) 100 UNIT/ML FlexPen Inject 6 to 10 units 3 times a day prior to each meal and as needed for blood sugar greater than 200 (Patient taking differently: Inject 0-10 Units into the skin 3 (three) times daily with meals. Sliding scale: CBG 0-200 = 0 units. 201-250 = 6 units 251-300 = 8 units 301-350 = 10 units 351 or greater = call MD.) 15 pen 3  . insulin glargine (LANTUS) 100 UNIT/ML injection Inject 0.2 mLs (20 Units total) into the skin at bedtime.    . levETIRAcetam (KEPPRA) 100 MG/ML solution Take 10 mLs (1,000 mg total) by mouth 2 (two) times daily.    Marland Kitchen lisinopril (PRINIVIL,ZESTRIL) 2.5 MG tablet Take 2.5 mg by mouth daily.    Marland Kitchen  metFORMIN (GLUCOPHAGE) 500 MG tablet Take by mouth 2 (two) times daily with a meal.    . mirtazapine (REMERON) 7.5 MG tablet Take 7.5 mg by mouth at bedtime.    . polyethylene glycol (MIRALAX / GLYCOLAX) packet Take 17 g by mouth daily as needed. (Patient taking differently: Take 17 g by mouth daily as needed (constipation). )  0  . potassium chloride SA (K-DUR,KLOR-CON) 20 MEQ tablet Take 1 tablet (20 mEq total) by mouth daily. 90 tablet 3    . tamsulosin (FLOMAX) 0.4 MG CAPS capsule Take 1 capsule (0.4 mg total) by mouth daily. (Patient taking differently: Take 0.4 mg by mouth daily after supper. )    . torsemide (DEMADEX) 20 MG tablet Take 1 tablet (20 mg total) by mouth daily.    . TRESIBA FLEXTOUCH 200 UNIT/ML SOPN Inject 160 Units as directed every morning.     No current facility-administered medications for this visit.     No Known Allergies  Social History   Social History  . Marital status: Married    Spouse name: Douglas Carr  . Number of children: 3  . Years of educaErskine Squibbtion: N/A   Occupational History  . inspector for [employer]    Social History Main Topics  . Smoking status: Former Smoker    Packs/day: 2.00    Years: 22.00    Types: Cigarettes    Quit date: 10/23/2011  . Smokeless tobacco: Never Used  . Alcohol use 0.0 oz/week    2 - 3 Cans of beer per week     Comment: occasional  . Drug use: No  . Sexual activity: Not on file   Other Topics Concern  . Not on file   Social History Narrative   [place] in [place], [place]     Review of Systems: General: negative for chills, fever, night sweats or weight changes.  Cardiovascular: negative for chest pain, dyspnea on exertion, edema, orthopnea, palpitations, paroxysmal nocturnal dyspnea or shortness of breath Dermatological: negative for rash Respiratory: negative for cough or wheezing Urologic: negative for hematuria Abdominal: negative for nausea, vomiting, diarrhea, bright red blood per rectum, melena, or hematemesis Neurologic: negative for visual changes, syncope, or dizziness All other systems reviewed and are otherwise negative except as noted above.    Blood pressure 124/72, pulse 86, height 5\' 9"  (1.753 m), weight 220 lb 12.8 oz (100.2 kg).  General appearance: alert and no distress Neck: no adenopathy, no carotid bruit, no JVD, supple, symmetrical, trachea midline and thyroid not enlarged, symmetric, no tenderness/mass/nodules Lungs:  clear to auscultation bilaterally Heart: regular rate and rhythm, S1, S2 normal, no murmur, click, rub or gallop Extremities: extremities normal, atraumatic, no cyanosis or edema  EKG not performed today  ASSESSMENT AND PLAN:   Essential hypertension History of essential hypertension blood pressure measured today at 124/72. He is on carvedilol and lisinopril. Continue current meds at current dosing  Combined hyperlipidemia associated with type 2 diabetes mellitus History of hyperlipidemia on high-dose statin therapy. We will recheck a lipid liver profile in 3 months.  Acute ST elevation myocardial infarction (STEMI) involving left circumflex coronary artery (HCC) History of remote myocardial infarction age 51 with cardiac catheterization performed by Dr. Alanda AmassWeintraub revealing what sounds like an occluded vessel with collaterals. Intervention was not performed. He had a STEMI on 05/11/16 and was taken to the Cath Lab by Dr. SwazilandJordan where a stent was placed in the second obtuse marginal branch. His EF was found to be  depressed in the 30% range. He had a witnessed cardiac arrest on 05/24/16 secondary to ventricular fibrillation. Cardiac catheterization revealed a 90% stenosis at the origin of the second obtuse marginal branch. Dr. Herbie Baltimore placed a stent in the AV groove extending into the origin of the second obtuse marginal branch with excellent result. He did have residual 60% mid dominant RCA with occluded PDA distally was likely related to his old infarct at age 84. He remains on dual antiplatelet therapy.  Acute on chronic systolic heart failure (HCC) The patient was discharged on 05/11/16 after his STEMI and readmitted the following day with volume overload. He was diuresed. His EF was in the 30% range. He is on optimal medical therapy at this time.  Cardiac arrest [facility]) Mr. Berkley had witnessed cardiac arrest and bystander CPR by his 81 year old son. He underwent urgent intervention by Dr. Herbie Baltimore  with stenting of the circumflex obtuse marginal branch. He was intubated sedated and cooled under the [employer] Protocol. Ultimately he regained consciousness and has been rehabilitation since. He is now alert, oriented, and ambulatory without significant neurologic deficit.  Ischemic cardiomyopathy History of ischemic cardiomyopathy status post witnessed VF arrest with persistent LV dysfunction. He recently underwent ICD implantation by Dr. Elberta Fortis on 06/30/16 for secondary prevention.      Runell Gess MD FACP,FACC,FAHA, [facility] 07/08/2016 4:02 PM

## 2016-07-10 ENCOUNTER — Telehealth: Payer: Self-pay | Admitting: Family Medicine

## 2016-07-10 NOTE — Telephone Encounter (Signed)
Patients wife aware of results. Patients wife also wanted to see who we recommended for a pcp since Hyacinth MeekerMiller was leaving. Wife advised to schedule with [name] or Ermalinda MemosBradshaw.

## 2016-07-15 ENCOUNTER — Telehealth: Payer: Self-pay | Admitting: Cardiovascular Disease

## 2016-07-15 NOTE — Telephone Encounter (Signed)
New Message   [facility] for [facility] to begin home health services for patient.

## 2016-07-15 NOTE — Telephone Encounter (Signed)
Routed to MD for review and approval of orders.

## 2016-07-16 ENCOUNTER — Ambulatory Visit (INDEPENDENT_AMBULATORY_CARE_PROVIDER_SITE_OTHER): Payer: BC Managed Care – PPO | Admitting: *Deleted

## 2016-07-16 ENCOUNTER — Telehealth (HOSPITAL_COMMUNITY): Payer: Self-pay | Admitting: Family Medicine

## 2016-07-16 DIAGNOSIS — I469 Cardiac arrest, cause unspecified: Secondary | ICD-10-CM | POA: Diagnosis not present

## 2016-07-16 DIAGNOSIS — I4901 Ventricular fibrillation: Secondary | ICD-10-CM | POA: Diagnosis not present

## 2016-07-16 LAB — CUP PACEART INCLINIC DEVICE CHECK
Date Time Interrogation Session: 20180314143930
HIGH POWER IMPEDANCE MEASURED VALUE: 57 Ohm
Implantable Lead Implant Date: 20180226
Lead Channel Impedance Value: 342 Ohm
Lead Channel Impedance Value: 399 Ohm
Lead Channel Pacing Threshold Amplitude: 1 V
Lead Channel Pacing Threshold Pulse Width: 0.4 ms
Lead Channel Sensing Intrinsic Amplitude: 18.5 mV
MDC IDC LEAD LOCATION: 753860
MDC IDC MSMT BATTERY REMAINING LONGEVITY: 137 mo
MDC IDC MSMT BATTERY VOLTAGE: 3.17 V
MDC IDC PG IMPLANT DT: 20180226
MDC IDC SET LEADCHNL RV PACING AMPLITUDE: 3.5 V
MDC IDC SET LEADCHNL RV PACING PULSEWIDTH: 0.4 ms
MDC IDC SET LEADCHNL RV SENSING SENSITIVITY: 0.3 mV
MDC IDC STAT BRADY RV PERCENT PACED: 0 %

## 2016-07-16 NOTE — Progress Notes (Signed)
Wound check appointment. Steri-strips removed. Wound without redness or edema. Incision edges approximated, wound well healed. Normal device function. Threshold, sensing, and impedance consistent with implant measurements. Device programmed at 3.5V for extra safety margin until 3 month visit. Histogram distribution appropriate for patient and level of activity. No mode switches or ventricular arrhythmias noted. Patient educated about wound care, arm mobility, lifting restrictions, shock plan. ROV with WC 6/5.

## 2016-07-16 NOTE — Telephone Encounter (Signed)
I called and spoke to patient, patient is interested in participating in the Cardiac Rehab program. I went over patient insurance benefits with patient. Patient has BCBS; no-copayment, deductible $1250/$1250 has been met, out of pocket $4350/$4350 has been met, 0% co-insurance, no pre-authorization, and no limit on visit. Passport/reference #[id number]. Patient verbalized understanding. Patient information given to Carlette to review. Patient informed I will call back to schedule after Carlette reviews information.  ° °

## 2016-07-16 NOTE — Telephone Encounter (Signed)
Okay to begin her home health service from my point of view.

## 2016-07-17 ENCOUNTER — Telehealth (HOSPITAL_COMMUNITY): Payer: Self-pay | Admitting: *Deleted

## 2016-07-17 NOTE — Telephone Encounter (Signed)
LMTCB

## 2016-07-17 NOTE — Telephone Encounter (Signed)
After review of information in Epic, pt is to received home health with physical and occupational therapy.  Pt is not ready for outpatient cardiac rehab.  Pt also is unable to drive at this time. Asked it pt would prefer to [facility] ( [place] in [place]). Pt indicated he preferred to attend in [place]. Will call back in a couple of weeks to see how therapy is progressing and readiness to participate in cardiac rehab. Alanson Alyarlette Jeffrey Graefe RN, BSN

## 2016-07-24 ENCOUNTER — Ambulatory Visit: Payer: BC Managed Care – PPO | Admitting: Family Medicine

## 2016-07-28 NOTE — Progress Notes (Signed)
Subjective:   Douglas Carr was seen in consultation in the movement disorder clinic at the request of MILLER, Bertram Millard, MD.  Multiple hospital notes have been reviewed.  Pt accompanied by wife who supplements the history.  The patient was admitted for a lengthy hospital stay for V. fib arrest in January, 2018.  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  Wife and patient report that this movement is better.  The patient had a defibrillator placed in February, 2018.  Following the defibrillator placement, the patient reported that his tremor, which he had at baseline, got much worse.   Wife and patient state that he really didn't have tremor at baseline.  He was evaluated by neurology and it was felt that this could be either an unmasking of his baseline tremor or worsening of the myoclonus.  It was recommended that he stay on Keppra and add clonazepam.  He takes it twice per day.  It works but if he is stressed out he will note tremor.  If he has tremor now, it is whole body but legs more than arms.  No preference for one side of body.  More at night than during the day.  It may last for several hours and then will go away.  No family hx of tremor.    Current/Previously tried tremor medications: n/a  Current medications that may exacerbate tremor:  n/a  Outside reports reviewed: historical medical records, radiology reports and referral letter/letters.MRI brain done 06/02/16 and demonstrated L thalamic infarct (acute).  I did review this personally.  No Known Allergies  Outpatient Encounter Prescriptions as of 07/31/2016  Medication Sig  . acetaminophen (TYLENOL) 325 MG tablet Take 2 tablets (650 mg total) by mouth every 4 (four) hours as needed for headache or mild pain.  Marland Kitchen aspirin 81 MG chewable tablet Chew 1 tablet (81 mg total) by mouth daily.  Marland Kitchen atorvastatin (LIPITOR) 80 MG tablet Take 1 tablet (80 mg total) by mouth  daily at 6 PM.  . carvedilol (COREG) 6.25 MG tablet Take 1 tablet (6.25 mg total) by mouth 2 (two) times daily with a meal.  . clonazePAM (KLONOPIN) 0.5 MG tablet Take 1 tablet (0.5 mg total) by mouth 2 (two) times daily.  . clopidogrel (PLAVIX) 75 MG tablet Take 1 tablet (75 mg total) by mouth daily. (Patient taking differently: Take 75 mg by mouth every evening. )  . insulin aspart (NOVOLOG FLEXPEN) 100 UNIT/ML FlexPen Inject 6 to 10 units 3 times a day prior to each meal and as needed for blood sugar greater than 200 (Patient taking differently: Inject 0-10 Units into the skin 3 (three) times daily with meals. Sliding scale: CBG 0-200 = 0 units. 201-250 = 6 units 251-300 = 8 units 301-350 = 10 units 351 or greater = call MD.)  . levETIRAcetam (KEPPRA) 100 MG/ML solution Take 10 mLs (1,000 mg total) by mouth 2 (two) times daily.  Marland Kitchen lisinopril (PRINIVIL,ZESTRIL) 2.5 MG tablet Take 2.5 mg by mouth daily.  . metFORMIN (GLUCOPHAGE) 500 MG tablet Take by mouth 2 (two) times daily with a meal.  . mirtazapine (REMERON) 7.5 MG tablet Take 7.5 mg by mouth at bedtime.  . potassium chloride SA (K-DUR,KLOR-CON) 20 MEQ tablet Take 1 tablet (20 mEq total) by mouth daily.  . tamsulosin (FLOMAX) 0.4 MG CAPS capsule Take 1 capsule (0.4 mg total) by mouth daily. (Patient taking differently: Take 0.4 mg by mouth  daily after supper. )  . torsemide (DEMADEX) 20 MG tablet Take 1 tablet (20 mg total) by mouth daily.  . TRESIBA FLEXTOUCH 200 UNIT/ML SOPN Inject 160 Units as directed every morning.  . [DISCONTINUED] insulin glargine (LANTUS) 100 UNIT/ML injection Inject 0.2 mLs (20 Units total) into the skin at bedtime.  . [DISCONTINUED] polyethylene glycol (MIRALAX / GLYCOLAX) packet Take 17 g by mouth daily as needed. (Patient not taking: Reported on 07/16/2016)   No facility-administered encounter medications on file as of 07/31/2016.     Past Medical History:  Diagnosis Date  . Acute ST elevation myocardial  infarction (STEMI) involving left circumflex coronary artery (HCC) 05/07/2016   PCI to Cx-OM  . Anginal pain (HCC)    secondary to sm. vessel disease  . Anxiety   . Arthritis    BIL KNEE PAIN AND BIL ANKLE PAIN  . Bone spur of ankle   . Cardiac arrest (HCC) 05/24/2016   with v fib  . 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 Coronary artery disease involving native coronary artery of native heart with angina pectoris (HCC) 05/24/2016   Remote MI at 51 years of age, Last cath 2010-diffuse non-obstructive disease; last echo 06/16/08 -normal LV function, moderate concentric hypertrophy; nuc 08/2008 no ischemia;  medical therapy; STEMI May 07 2016 - PCI to Cx-OM  . Diabetes mellitus    ON ORAL MEDICATION AND INSULIN  . Dilated cardiomyopathy (HCC) 05/24/2016   EF 325-30% by Echo post STEMI (previously 30-35%)   . Hyperlipidemia   . Hypertension   . Myocardial infarction 1996   Post MI  . Peripheral vascular disease (HCC)    HAS LEFT CAROTID ARTERY STENOSIS   AND IS S/P RIGHT CAROTID ENDARTERECTOMY 2010 Last carotid dopplers 01/08/2012 wth patent endarterectomy site  . Status post coronary artery stent placement     Past Surgical History:  Procedure Laterality Date  . APPENDECTOMY    . CARDIAC CATHETERIZATION     FEB 2010, significant branch vessel disease wth diag, marginal, PDA & PLA, nml. LV function  . CARDIAC CATHETERIZATION N/A 05/07/2016   Procedure: Left Heart Cath and Coronary Angiography;  Surgeon: Peter M Swaziland, MD;  Location: [facility] INVASIVE CV LAB;  Service: Cardiovascular;  Laterality: N/A;  . CARDIAC CATHETERIZATION N/A 05/07/2016   Procedure: Coronary Stent Intervention;  Surgeon: Peter M Swaziland, MD;  Location: [facility] INVASIVE CV LAB;  Service: Cardiovascular;  Laterality: N/A;  . CARDIAC CATHETERIZATION N/A 05/24/2016   Procedure: Left Heart Cath and Coronary Angiography;  Surgeon: Marykay Lex, MD;  Location: [facility] INVASIVE CV LAB;   Service: Cardiovascular;  Laterality: N/A;  . CARDIAC CATHETERIZATION N/A 05/24/2016   Procedure: Coronary Stent Intervention;  Surgeon: Marykay Lex, MD;  Location: [facility] INVASIVE CV LAB;  Service: Cardiovascular;  Laterality: N/A;  2.5x20 Promus to Ostial/proximal circumflex  . CAROTID ENDARTERECTOMY  09/2008   Rt CEA  . ICD IMPLANT N/A 06/30/2016   Procedure: ICD Implant;  Surgeon: Will Jorja Loa, MD;  Location: [facility] INVASIVE CV LAB;  Service: Cardiovascular;  Laterality: N/A;  . LUNG BIOPSY  2013   Bx's suggest granulomatous dz.  . RT ANKLE   2013    Social History   Social History  . Marital status: Married    Spouse name: Erskine Squibb  . Number of children: 3  . Years of education: N/A   Occupational History  . inspector for [employer]  Social History Main Topics  . Smoking status: Former Smoker    Packs/day: 2.00    Years: 22.00    Types: Cigarettes    Quit date: 10/23/2011  . Smokeless tobacco: Current User    Types: Snuff  . Alcohol use No  . Drug use: No  . Sexual activity: Not on file   Other Topics Concern  . Not on file   Social History Narrative   Pt lives with family in [place], [place].    Family Status  Relation Status  . Mother Alive  . Maternal Grandmother Deceased at age 58  . Maternal Grandfather Deceased at age 87  . Paternal Grandfather Deceased at age 61   . Brother Alive  . Paternal Grandmother Deceased at age 2  . Father Deceased  . Child Alive    Review of Systems A complete 10 system ROS was obtained and was negative apart from what is mentioned.   Objective:   VITALS:   Vitals:   07/31/16 0940  BP: 98/64  Pulse: 94  SpO2: 98%  Weight: 233 lb (105.7 kg)  Height: 5\' 9"  (1.753 m)   Gen:  Appears stated age and in NAD. HEENT:  Normocephalic, atraumatic. The mucous membranes are moist. The superficial temporal arteries are without ropiness or tenderness. Cardiovascular: Regular rate and rhythm. Lungs: Clear to auscultation  bilaterally. Neck: There are no carotid bruits noted bilaterally.  NEUROLOGICAL:  Orientation:  The patient is alert and oriented x 3.  Recent and remote memory are intact.  Attention span and concentration are normal.  Able to name objects and repeat without trouble.  Fund of knowledge is appropriate Cranial nerves: There is good facial symmetry.  The is asymmetric blink.  The pupils are equal round and reactive to light bilaterally. Fundoscopic exam reveals clear disc margins bilaterally. Extraocular muscles are intact and visual fields are full to confrontational testing. Speech is fluent but dysarthric. Soft palate rises symmetrically and there is no tongue deviation. Hearing is intact to conversational tone. Tone: Tone is good throughout. Sensation: Sensation is intact to light touch and pinprick throughout (facial, trunk, extremities). Vibration is intact at the bilateral big toe but decreased. There is no extinction with double simultaneous stimulation. There is no sensory dermatomal level identified. Coordination:  The patient has ataxia with HS on the right.  Mild ataxia with finger-nose-finger on the right.  No difficulty with hand opening and closing bilaterally.  No difficulty with heel taps are toe taps bilaterally. Motor: Strength is 5/5 in the bilateral upper and lower extremities.  Shoulder shrug is equal bilaterally.  There is no pronator drift.  There are no fasciculations noted. DTR's: Deep tendon reflexes are 1/4 at the bilateral biceps, triceps, brachioradialis, patella and achilles.  Plantar responses are downgoing bilaterally. Gait and Station: The patient is able to arise out of the chair without difficulty.  He walks slowly down the hall.  He has a cane, but chose not to use it.  He is mildly ataxic.  MOVEMENT EXAM: Abnormal movements:  There is occasional myoclonus noted, primarily in the shoulders.  It is fairly rare.  He only has difficulty with Archimedes spirals because  of mild ataxia when putting the hand on the page.  He has no difficulty when pouring water from one glass to another.  He has no tremor.  Outstretched hands.  He has no tremor with intention, but he does have some ataxia.  Lab Results  Component Value Date   HGBA1C  9.3 (H) 05/07/2016        Assessment/Plan:   1.  Abnormal movements  -Patient has several different types of movements.  He has myoclonus, which I suspect will burn out with the course of time.  His wife states that they are much better than in the past.  Told the patient and his wife that we may be able to get him off of Keppra in the future, which was started because of post anoxic myoclonus.  We will reevaluate this in the late summer and see how he is doing.  -Patient does have some ataxia when he goes to grab something and explained the difference between this and tremor.  Explained how this may affect him in his work life.  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  I do not think it is safe for him to go back right now.  -Wife brings a video of the patient having tremor.  It was both arms and legs.  They state that it is pretty well controlled with clonazepam 0.5 mg twice a day, with the exception of when he gets angry or upset and then he will have this again.  This happens about 1 time per week.  If that happens, I told him to take an extra half a tablet to one full tablet.  I asked him to not take more than one full tablet in a day.  -They asked me about driving.  I do think he is going to be able to drive without restrictions from a neurologic standpoint.  I did recommend, however, a driving evaluation.  We'll give them the number to the driving evaluator and they can set this up.  2.  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 fib arrest.  It appears that there is also a lesion in left STN.  Talked  about stroke signs and symptoms.  He is in physical therapy and speech therapy now.  We'll continue to safely work with them.  Talked about stroke signs and symptoms and to go to the emergency room immediately should he have any of these.  3.  Peripheral neuropathy  -The patient has clinical examination evidence of a diffuse peripheral neuropathy, which certainly can affect gait and balance.  We discussed safety associated with peripheral neuropathy.  We discussed balance therapy and the importance of ambulatory assistive device for balance assistance.  When necessary he is doing both of these.  He likely has neuropathy from his long-standing, uncontrolled diabetes.  4.  Follow-up in the next 4-5 months, sooner should new neurologic issues arise.  Greater than 50% of this 60 minute visit in counseling with the patient and his wife.   CC:  MILLER, Bertram MillardSTEPHEN M, MD

## 2016-07-28 NOTE — Telephone Encounter (Signed)
Return[facility] and [facility] Allyson SabalBerry.

## 2016-07-30 ENCOUNTER — Ambulatory Visit: Payer: BC Managed Care – PPO | Admitting: Family Medicine

## 2016-07-31 ENCOUNTER — Encounter: Payer: Self-pay | Admitting: Neurology

## 2016-07-31 ENCOUNTER — Ambulatory Visit (INDEPENDENT_AMBULATORY_CARE_PROVIDER_SITE_OTHER): Payer: BC Managed Care – PPO | Admitting: Neurology

## 2016-07-31 VITALS — BP 98/64 | HR 94 | Ht 69.0 in | Wt 233.0 lb

## 2016-07-31 DIAGNOSIS — R270 Ataxia, unspecified: Secondary | ICD-10-CM | POA: Diagnosis not present

## 2016-07-31 DIAGNOSIS — G253 Myoclonus: Secondary | ICD-10-CM | POA: Diagnosis not present

## 2016-07-31 DIAGNOSIS — E1142 Type 2 diabetes mellitus with diabetic polyneuropathy: Secondary | ICD-10-CM

## 2016-07-31 DIAGNOSIS — R251 Tremor, unspecified: Secondary | ICD-10-CM | POA: Diagnosis not present

## 2016-07-31 NOTE — Patient Instructions (Signed)
1. If you are interested in the driving assessment, you can contact [employer] in [place]. [phone].  

## 2016-08-05 ENCOUNTER — Other Ambulatory Visit: Payer: Self-pay | Admitting: Family Medicine

## 2016-08-06 ENCOUNTER — Other Ambulatory Visit: Payer: Self-pay | Admitting: Family Medicine

## 2016-08-06 ENCOUNTER — Other Ambulatory Visit: Payer: Self-pay

## 2016-08-06 NOTE — Telephone Encounter (Signed)
FYI- Patient aware. Klonopin called in. Patient states that he is using 160 units of [place] daily.

## 2016-08-07 ENCOUNTER — Telehealth: Payer: Self-pay | Admitting: Family Medicine

## 2016-08-07 MED ORDER — LEVETIRACETAM 100 MG/ML PO SOLN: 1000.0000 mg | Freq: Two times a day (BID) | ORAL | 1 refills | Status: DC

## 2016-08-07 NOTE — Telephone Encounter (Signed)
Please change [facility] tablets [facility]. He has appt tomorrow with you.

## 2016-08-07 NOTE — Telephone Encounter (Signed)
Sent to [employer]

## 2016-08-08 ENCOUNTER — Telehealth: Payer: Self-pay | Admitting: Family Medicine

## 2016-08-08 ENCOUNTER — Ambulatory Visit: Payer: BC Managed Care – PPO | Admitting: Family Medicine

## 2016-08-08 NOTE — Telephone Encounter (Signed)
Okay to do tablets [facility] notified

## 2016-08-12 ENCOUNTER — Telehealth (HOSPITAL_COMMUNITY): Payer: Self-pay | Admitting: Family Medicine

## 2016-08-12 NOTE — Telephone Encounter (Signed)
I called and left message on patient voicemail to call the office about scheduling and participating in the Cardiac Rehab program. I left my contact information on patient voicemail to return my call.  ° °

## 2016-08-13 ENCOUNTER — Encounter: Payer: Self-pay | Admitting: Family Medicine

## 2016-08-13 ENCOUNTER — Ambulatory Visit (INDEPENDENT_AMBULATORY_CARE_PROVIDER_SITE_OTHER): Payer: BC Managed Care – PPO | Admitting: Family Medicine

## 2016-08-13 VITALS — BP 122/78 | HR 70 | Temp 96.8°F | Ht 69.0 in | Wt 228.0 lb

## 2016-08-13 DIAGNOSIS — E669 Obesity, unspecified: Secondary | ICD-10-CM

## 2016-08-13 DIAGNOSIS — I10 Essential (primary) hypertension: Secondary | ICD-10-CM | POA: Diagnosis not present

## 2016-08-13 DIAGNOSIS — E118 Type 2 diabetes mellitus with unspecified complications: Secondary | ICD-10-CM | POA: Diagnosis not present

## 2016-08-13 DIAGNOSIS — G931 Anoxic brain damage, not elsewhere classified: Secondary | ICD-10-CM | POA: Diagnosis not present

## 2016-08-13 DIAGNOSIS — E1169 Type 2 diabetes mellitus with other specified complication: Secondary | ICD-10-CM

## 2016-08-13 DIAGNOSIS — I6930 Unspecified sequelae of cerebral infarction: Secondary | ICD-10-CM

## 2016-08-13 DIAGNOSIS — E119 Type 2 diabetes mellitus without complications: Secondary | ICD-10-CM

## 2016-08-13 DIAGNOSIS — I5042 Chronic combined systolic (congestive) and diastolic (congestive) heart failure: Secondary | ICD-10-CM

## 2016-08-13 LAB — BAYER DCA HB A1C WAIVED: HB A1C (BAYER DCA - WAIVED): 6.8 % (ref <7.0)

## 2016-08-13 NOTE — Progress Notes (Signed)
BP 122/78   Pulse 70   Temp (!) 96.8 F (36 C) (Oral)   Ht 5' 9"  (1.753 m)   Wt 228 lb (103.4 kg)   BMI 33.67 kg/m    Subjective:    Patient ID: Douglas Carr, male    DOB: 04/17/1966, 51 y.o.   MRN: [id number]  HPI: Douglas Carr is a 51 y.o. male presenting on 08/13/2016 for Establish Care (former patient of Dr. Sabra Heck, establishing care with new PCP)   HPI Type 2 diabetes mellitus Patient comes in today for recheck of his diabetes. Patient has been currently taking Metformin, tresiba, NovoLog. Patient is currently on an ACE inhibitor. Patient has not seen an ophthalmologist this year. Patient denies any new issues with his feet. The patient has known complications from the diabetes including coronary artery disease and stroke which caused anoxic brain injury for which the patient sees a neurologist for tremors. Patient is also obese and knows this and says he is trying to change but is having a lot of difficulty with it.  Hypertension and CHF check Patient is currently on lisinopril and torsemide for his blood pressure and congestive heart failure. He does have a cardiologist that he sees well to manage this. His blood pressure today is 122/78. Patient denies headaches, blurred vision, chest pains, shortness of breath, or weakness. Denies any side effects from medication and is content with current medication. He denies any wheezing or shortness of breath today.  Relevant past medical, surgical, family and social history reviewed and updated as indicated. Interim medical history since our last visit reviewed. Allergies and medications reviewed and updated.  Review of Systems  Constitutional: Negative for chills and fever.  Eyes: Negative for discharge.  Respiratory: Negative for cough, shortness of breath and wheezing.   Cardiovascular: Negative for chest pain, palpitations and leg swelling.  Musculoskeletal: Negative for back pain and gait problem.  Skin: Negative  for rash.  Neurological: Negative for dizziness, weakness and light-headedness.  All other systems reviewed and are negative.   Per HPI unless specifically indicated above     Objective:    BP 122/78   Pulse 70   Temp (!) 96.8 F (36 C) (Oral)   Ht 5' 9"  (1.753 m)   Wt 228 lb (103.4 kg)   BMI 33.67 kg/m   Wt Readings from Last 3 Encounters:  08/13/16 228 lb (103.4 kg)  07/31/16 233 lb (105.7 kg)  07/08/16 220 lb 12.8 oz (100.2 kg)    Physical Exam  Constitutional: He is oriented to person, place, and time. He appears well-developed and well-nourished. No distress.  Eyes: Conjunctivae are normal. No scleral icterus.  Neck: Neck supple. No thyromegaly present.  Cardiovascular: Normal rate, regular rhythm, normal heart sounds and intact distal pulses.   No murmur heard. Pulmonary/Chest: Effort normal and breath sounds normal. No respiratory distress. He has no wheezes.  Musculoskeletal: Normal range of motion. He exhibits no edema.  Lymphadenopathy:    He has no cervical adenopathy.  Neurological: He is alert and oriented to person, place, and time. He has normal strength. A cranial nerve deficit (Slight facial droop on the left but otherwise intact) is present. No sensory deficit. He exhibits normal muscle tone (Good strength, mainly he says he has a lot of issues with balance). Coordination normal.  Skin: Skin is warm and dry. No rash noted. He is not diaphoretic.  Psychiatric: He has a normal mood and affect. His behavior is normal.  Nursing  note and vitals reviewed.     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 (HCC) (Chronic)   Relevant Orders   CBC with Differential/Platelet   Lipid panel     Endocrine    Diabetes mellitus type 2 in obese ([place])   Relevant Orders   Lipid panel   Bayer DCA Hb A1c Waived   CMP14+EGFR   Lipid panel   Bayer DCA Hb A1c Waived   Diabetes mellitus with complication (HCC)   Relevant Orders   Lipid panel   Bayer DCA Hb A1c Waived   CMP14+EGFR   Bayer DCA Hb A1c Waived     Nervous and Auditory   Anoxic brain injury [facility])   Relevant Orders   Ambulatory referral to Physical Therapy   CBC with Differential/Platelet    Other Visit Diagnoses    Late effects of cerebral ischemic stroke       Patient continues to see neurologist for this, has balance issues and right-sided weakness, we'll do referral to physical therapy   Relevant Orders   Ambulatory referral to Physical Therapy   Lipid panel       Follow up plan: Return in about 3 months (around 11/12/2016), or if symptoms worsen or fail to improve, for Hypertension and diabetes recheck.  Counseling provided for all of the vaccine components Orders Placed This Encounter  Procedures  . CBC with Differential/Platelet  . CMP14+EGFR  . Lipid panel  . Bayer DCA Hb A1c Waived  . CBC with Differential/Platelet  . CMP14+EGFR  . Lipid panel  . Bayer DCA Hb A1c Waived  . Ambulatory referral to Physical Therapy    Caryl Pina, MD [facility] 08/13/2016, 2:33 PM

## 2016-08-14 LAB — CBC WITH DIFFERENTIAL/PLATELET
BASOS ABS: 0 10*3/uL (ref 0.0–0.2)
Basos: 0 %
EOS (ABSOLUTE): 0.2 10*3/uL (ref 0.0–0.4)
EOS: 2 %
HEMATOCRIT: 42.7 % (ref 37.5–51.0)
Hemoglobin: 13.9 g/dL (ref 13.0–17.7)
Immature Grans (Abs): 0 10*3/uL (ref 0.0–0.1)
Immature Granulocytes: 0 %
LYMPHS: 46 %
Lymphocytes Absolute: 4.3 10*3/uL — ABNORMAL HIGH (ref 0.7–3.1)
MCH: 27.7 pg (ref 26.6–33.0)
MCHC: 32.6 g/dL (ref 31.5–35.7)
MCV: 85 fL (ref 79–97)
MONOCYTES: 8 %
MONOS ABS: 0.8 10*3/uL (ref 0.1–0.9)
Neutrophils Absolute: 4.1 10*3/uL (ref 1.4–7.0)
Neutrophils: 44 %
Platelets: 221 10*3/uL (ref 150–379)
RBC: 5.02 x10E6/uL (ref 4.14–5.80)
RDW: 14.3 % (ref 12.3–15.4)
WBC: 9.3 10*3/uL (ref 3.4–10.8)

## 2016-08-14 LAB — CMP14+EGFR
ALBUMIN: 4.7 g/dL (ref 3.5–5.5)
ALK PHOS: 106 IU/L (ref 39–117)
ALT: 22 IU/L (ref 0–44)
AST: 14 IU/L (ref 0–40)
Albumin/Globulin Ratio: 1.8 (ref 1.2–2.2)
BILIRUBIN TOTAL: 0.3 mg/dL (ref 0.0–1.2)
BUN / CREAT RATIO: 23 — AB (ref 9–20)
BUN: 22 mg/dL (ref 6–24)
CHLORIDE: 102 mmol/L (ref 96–106)
CO2: 27 mmol/L (ref 18–29)
CREATININE: 0.96 mg/dL (ref 0.76–1.27)
Calcium: 9.6 mg/dL (ref 8.7–10.2)
GFR calc Af Amer: 107 mL/min/{1.73_m2} (ref >59)
GFR calc non Af Amer: 92 mL/min/{1.73_m2} (ref >59)
GLOBULIN, TOTAL: 2.6 g/dL (ref 1.5–4.5)
GLUCOSE: 81 mg/dL (ref 65–99)
Potassium: 4.6 mmol/L (ref 3.5–5.2)
SODIUM: 145 mmol/L — AB (ref 134–144)
Total Protein: 7.3 g/dL (ref 6.0–8.5)

## 2016-08-14 LAB — LIPID PANEL
CHOLESTEROL TOTAL: 118 mg/dL (ref 100–199)
Chol/HDL Ratio: 3.8 ratio (ref 0.0–5.0)
HDL: 31 mg/dL — ABNORMAL LOW (ref >39)
LDL CALC: 68 mg/dL (ref 0–99)
TRIGLYCERIDES: 93 mg/dL (ref 0–149)
VLDL Cholesterol Cal: 19 mg/dL (ref 5–40)

## 2016-08-20 ENCOUNTER — Encounter (HOSPITAL_COMMUNITY): Payer: Self-pay | Admitting: Family Medicine

## 2016-08-20 NOTE — Progress Notes (Signed)
I mailed patient letter with information about the Cardiac Rehab program.  °

## 2016-08-25 ENCOUNTER — Ambulatory Visit: Payer: BC Managed Care – PPO | Attending: Family Medicine | Admitting: Physical Therapy

## 2016-08-25 DIAGNOSIS — M6281 Muscle weakness (generalized): Secondary | ICD-10-CM | POA: Insufficient documentation

## 2016-08-25 DIAGNOSIS — R262 Difficulty in walking, not elsewhere classified: Secondary | ICD-10-CM | POA: Diagnosis present

## 2016-08-25 DIAGNOSIS — R2681 Unsteadiness on feet: Secondary | ICD-10-CM | POA: Diagnosis present

## 2016-08-25 NOTE — Therapy (Signed)
[facility] [address] [place], [place], [address] Phone: [phone]   Fax:  [phone]  Physical Therapy Evaluation  Patient Details  Name: Douglas Carr MRN: [id number] Date of Birth: 01-18-1966 Referring Provider: Ivin Booty [name] MD.  Encounter Date: 08/25/2016      PT End of Session - 08/25/16 1229    Visit Number 1   Number of Visits 16   Date for PT Re-Evaluation 10/20/16   PT Start Time 1047   PT Stop Time 1129   PT Time Calculation (min) 42 min   Activity Tolerance Patient tolerated treatment well   Behavior During Therapy [facility] for tasks assessed/performed      Past Medical History:  Diagnosis Date  . Acute ST elevation myocardial infarction (STEMI) involving left circumflex coronary artery (HCC) 05/07/2016   PCI to Cx-OM  . Anginal pain (HCC)    secondary to sm. vessel disease  . Anxiety   . Arthritis    BIL KNEE PAIN AND BIL ANKLE PAIN  . Bone spur of ankle   . Cardiac arrest (HCC) 05/24/2016   with v fib  . 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 Coronary artery disease involving native coronary artery of native heart with angina pectoris (HCC) 05/24/2016   Remote MI at 51 years of age, Last cath 2010-diffuse non-obstructive disease; last echo 06/16/08 -normal LV function, moderate concentric hypertrophy; nuc 08/2008 no ischemia;  medical therapy; STEMI May 07 2016 - PCI to Cx-OM  . Diabetes mellitus    ON ORAL MEDICATION AND INSULIN  . Dilated cardiomyopathy (HCC) 05/24/2016   EF 325-30% by Echo post STEMI (previously 30-35%)   . Hyperlipidemia   . Hypertension   . Myocardial infarction (HCC) 1996   Post MI  . Peripheral vascular disease (HCC)    HAS LEFT CAROTID ARTERY STENOSIS   AND IS S/P RIGHT CAROTID ENDARTERECTOMY 2010 Last carotid dopplers 01/08/2012 wth patent endarterectomy site  . Status post coronary artery stent placement     Past  Surgical History:  Procedure Laterality Date  . APPENDECTOMY    . CARDIAC CATHETERIZATION     FEB 2010, significant branch vessel disease wth diag, marginal, PDA & PLA, nml. LV function  . CARDIAC CATHETERIZATION N/A 05/07/2016   Procedure: Left Heart Cath and Coronary Angiography;  Surgeon: Peter M Swaziland, MD;  Location: [facility] INVASIVE CV LAB;  Service: Cardiovascular;  Laterality: N/A;  . CARDIAC CATHETERIZATION N/A 05/07/2016   Procedure: Coronary Stent Intervention;  Surgeon: Peter M Swaziland, MD;  Location: [facility] INVASIVE CV LAB;  Service: Cardiovascular;  Laterality: N/A;  . CARDIAC CATHETERIZATION N/A 05/24/2016   Procedure: Left Heart Cath and Coronary Angiography;  Surgeon: Marykay Lex, MD;  Location: [facility] INVASIVE CV LAB;  Service: Cardiovascular;  Laterality: N/A;  . CARDIAC CATHETERIZATION N/A 05/24/2016   Procedure: Coronary Stent Intervention;  Surgeon: Marykay Lex, MD;  Location: [facility] INVASIVE CV LAB;  Service: Cardiovascular;  Laterality: N/A;  2.5x20 Promus to Ostial/proximal circumflex  . CAROTID ENDARTERECTOMY  09/2008   Rt CEA  . ICD IMPLANT N/A 06/30/2016   Procedure: ICD Implant;  Surgeon: Will Jorja Loa, MD;  Location: [facility] INVASIVE CV LAB;  Service: Cardiovascular;  Laterality: N/A;  . LUNG BIOPSY  2013   Bx's suggest granulomatous dz.  . RT ANKLE   2013    There were no vitals filed for this visit.       Subjective Assessment -  08/25/16 1218    Subjective The patient reports having an MI on 05/24/16 and reports a subsquent stroke.  He now has an ICD implant.  he has some expressive asphasia but can be easily understood.  He states he wants help with walking longer distances and balance.  He states the longer he walks his hips get painful.  He states when he goes for longer walks he uses a walker.  He monitors his BP and states it has not been high recently.  His pre-exercise BP was 108/84 and 02 sat= 95%.  Post-exercise it was measured at 112/86 with HR= 106 bpm and 02 at  93%.   Pertinent History MI, HTN, ICD implant.   Patient Stated Goals I want to walk better without hip pain and loss of balance.   Currently in Pain? No/denies            [facility] PT Assessment - 08/25/16 0001      Assessment   Medical Diagnosis Balance problems.   Referring Provider Arville Care MD.   Onset Date/Surgical Date --  05/24/16.   Hand Dominance Right     Precautions   Precautions Fall     Restrictions   Weight Bearing Restrictions No     Balance Screen   Has the patient fallen in the past 6 months Yes   How many times? --  2   Has the patient had a decrease in activity level because of a fear of falling?  No   Is the patient reluctant to leave their home because of a fear of falling?  No     Home [employer] information [employer] residence     Prior Function   Level of Independence Independent     Cognition   Overall Cognitive Status Within Functional Limits for tasks assessed   Attention Focused   Awareness Appears intact   Problem Solving Appears intact     Coordination   Gross Motor Movements are Fluid and Coordinated Yes   Fine Motor Movements are Fluid and Coordinated --  Right hand tremor.   Heel Shin Test --  Normal.     ROM / Strength   AROM / PROM / Strength AROM;Strength     AROM   Overall AROM Comments Normal LE AROM.     Strength   Overall Strength Comments Normal bilateral LE strength with excellent muscle tone.     Palpation   Palpation comment No c/o palable pain.     Special Tests    Special Tests --  Wobbly Romberg test but no loss of balance.     Transfers   Five time sit to stand comments  --  Independent.     Ambulation/Gait   Gait Pattern Decreased step length - right;Decreased step length - left;Decreased stride length;Decreased trunk rotation   Gait Comments Supervised gait today.     Standardized Balance Assessment   Standardized Balance Assessment Berg Balance Test     Berg Balance Test   Sit  to Stand Able to stand without using hands and stabilize independently   Standing Unsupported Able to stand 2 minutes with supervision   Sitting with Back Unsupported but Feet Supported on Floor or Stool Able to sit safely and securely 2 minutes   Stand to Sit Sits safely with minimal use of hands   Transfers Able to transfer safely, minor use of hands   Standing Unsupported with Eyes Closed Able to stand 10 seconds with supervision  Standing Ubsupported with Feet Together Able to place feet together independently and stand for 1 minute with supervision   From Standing, Reach Forward with Outstretched Arm Can reach confidently >25 cm (10")   From Standing Position, Pick up Object from Floor Able to pick up shoe, needs supervision   From Standing Position, Turn to Look Behind Over each Shoulder Looks behind from both sides and weight shifts well   Turn 360 Degrees Able to turn 360 degrees safely one side only in 4 seconds or less   Standing Unsupported, Alternately Place Feet on Step/Stool Able to stand independently and complete 8 steps >20 seconds   Standing Unsupported, One Foot in Front Able to plae foot ahead of the other independently and hold 30 seconds   Standing on One Leg Able to lift leg independently and hold equal to or more than 3 seconds   Total Score 47                   [facility] Adult PT Treatment/Exercise - 08/25/16 0001      Exercises   Exercises Knee/Hip     Knee/Hip Exercises: Aerobic   Nustep Level 4 x 10 minutes.                     PT Long Term Goals - 08/25/16 1249      PT LONG TERM GOAL #1   Title Independent with a HEP.   Time 8   Period Weeks   Status New     PT LONG TERM GOAL #2   Title Berg score= 53/56.   Time 8   Period Weeks   Status New     PT LONG TERM GOAL #3   Title Walk a community distance without an assisitve device.   Time 8   Period Weeks   Status New               Plan - 08/25/16 1245    Clinical  Impression Statement The patient prsents to OPPT with c/o balance losses.  He reports 2 falls over the last few months.  His Berg score was a 47/56.  He had an MI on 05/24/16 and now has an ICD implant.  He reports he uses a walker for longer walking distances and reports tha more he walks the more his hips hurt.  His impairments affect his ability to walk without pain and perform ADL's.  Patient will benefit from skilled physical therapy.   Rehab Potential Excellent   PT Frequency 2x / week   PT Duration 8 weeks   PT Treatment/Interventions ADLs/Self Care Home Management;Stair training;Gait training;Therapeutic activities;Therapeutic exercise;Balance training;Neuromuscular re-education;Patient/family education   PT Next Visit Plan Balance and gait activities.  Please monitor BP; HR and 02 sat.  Bilateral adly hip abduction.   Consulted and Agree with Plan of Care Patient      Patient will benefit from skilled therapeutic intervention in order to improve the following deficits and impairments:  Abnormal gait, Decreased balance  Visit Diagnosis: Unsteadiness on feet - Plan: PT plan of care cert/re-cert     Problem List Patient Active Problem List   Diagnosis Date Noted  . Ischemic cardiomyopathy 06/30/2016  . Diabetes mellitus with complication (HCC)   . Diabetes mellitus type 2 in obese (HCC)   . Acute blood loss anemia   . Myoclonus   . Anoxic brain injury (HCC)   . Seizures (HCC)   . Dilated cardiomyopathy (HCC) 05/24/2016  .  Coronary artery disease involving native coronary artery of native heart with angina pectoris (HCC) 05/24/2016  . 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 Status post coronary artery stent placement   . Essential hypertension 09/21/2013  . Pulmonary nodules 10/16/2011    Rever Pichette, [place]  MPT 08/25/2016, 12:52 PM  [facility] [address] [place],  [place], [address] Phone: [phone]   Fax:  [phone]  Name: Douglas Carr MRN: [id number] Date of Birth: 08/05/1965

## 2016-08-27 ENCOUNTER — Encounter: Payer: Self-pay | Admitting: Physical Therapy

## 2016-08-27 ENCOUNTER — Ambulatory Visit: Payer: BC Managed Care – PPO | Admitting: Physical Therapy

## 2016-08-27 VITALS — BP 115/87 | HR 81

## 2016-08-27 DIAGNOSIS — R262 Difficulty in walking, not elsewhere classified: Secondary | ICD-10-CM

## 2016-08-27 DIAGNOSIS — M6281 Muscle weakness (generalized): Secondary | ICD-10-CM

## 2016-08-27 DIAGNOSIS — R2681 Unsteadiness on feet: Secondary | ICD-10-CM

## 2016-08-27 NOTE — Therapy (Signed)
[facility] [address] [place], [place], [address] Phone: [phone]   Fax:  [phone]  Physical Therapy Treatment  Patient Details  Name: Douglas Carr MRN: [id number] Date of Birth: [birth year] Referring Provider: Arville Care, MD  Encounter Date: 08/27/2016      PT End of Session - 08/27/16 1158    Visit Number 2   Number of Visits 16   Date for PT Re-Evaluation 10/20/16   PT Start Time 1030   PT Stop Time 1110   PT Time Calculation (min) 40 min   Activity Tolerance Patient tolerated treatment well   Behavior During Therapy [facility] for tasks assessed/performed      Past Medical History:  Diagnosis Date  . Acute ST elevation myocardial infarction (STEMI) involving left circumflex coronary artery (HCC) 05/07/2016   PCI to Cx-OM  . Anginal pain (HCC)    secondary to sm. vessel disease  . Anxiety   . Arthritis    BIL KNEE PAIN AND BIL ANKLE PAIN  . Bone spur of ankle   . Cardiac arrest (HCC) 05/24/2016   with v fib  . 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 Coronary artery disease involving native coronary artery of native heart with angina pectoris (HCC) 05/24/2016   Remote MI at 51 years of age, Last cath 2010-diffuse non-obstructive disease; last echo 06/16/08 -normal LV function, moderate concentric hypertrophy; nuc 08/2008 no ischemia;  medical therapy; STEMI May 07 2016 - PCI to Cx-OM  . Diabetes mellitus    ON ORAL MEDICATION AND INSULIN  . Dilated cardiomyopathy (HCC) 05/24/2016   EF 325-30% by Echo post STEMI (previously 30-35%)   . Hyperlipidemia   . Hypertension   . Myocardial infarction (HCC) 1996   Post MI  . Peripheral vascular disease (HCC)    HAS LEFT CAROTID ARTERY STENOSIS   AND IS S/P RIGHT CAROTID ENDARTERECTOMY 2010 Last carotid dopplers 01/08/2012 wth patent endarterectomy site  . Status post coronary artery stent placement     Past  Surgical History:  Procedure Laterality Date  . APPENDECTOMY    . CARDIAC CATHETERIZATION     FEB 2010, significant branch vessel disease wth diag, marginal, PDA & PLA, nml. LV function  . CARDIAC CATHETERIZATION N/A 05/07/2016   Procedure: Left Heart Cath and Coronary Angiography;  Surgeon: Peter M Swaziland, MD;  Location: [facility] INVASIVE CV LAB;  Service: Cardiovascular;  Laterality: N/A;  . CARDIAC CATHETERIZATION N/A 05/07/2016   Procedure: Coronary Stent Intervention;  Surgeon: Peter M Swaziland, MD;  Location: [facility] INVASIVE CV LAB;  Service: Cardiovascular;  Laterality: N/A;  . CARDIAC CATHETERIZATION N/A 05/24/2016   Procedure: Left Heart Cath and Coronary Angiography;  Surgeon: Marykay Lex, MD;  Location: [facility] INVASIVE CV LAB;  Service: Cardiovascular;  Laterality: N/A;  . CARDIAC CATHETERIZATION N/A 05/24/2016   Procedure: Coronary Stent Intervention;  Surgeon: Marykay Lex, MD;  Location: [facility] INVASIVE CV LAB;  Service: Cardiovascular;  Laterality: N/A;  2.5x20 Promus to Ostial/proximal circumflex  . CAROTID ENDARTERECTOMY  09/2008   Rt CEA  . ICD IMPLANT N/A 06/30/2016   Procedure: ICD Implant;  Surgeon: Will Jorja Loa, MD;  Location: [facility] INVASIVE CV LAB;  Service: Cardiovascular;  Laterality: N/A;  . LUNG BIOPSY  2013   Bx's suggest granulomatous dz.  . RT ANKLE   2013    Vitals:   08/27/16 1124  BP: 115/87  Pulse: 81  SpO2: 94%  Subjective Assessment - 08/27/16 1123    Subjective Pt arriving to therapy today reporting no pain. No new falls reported since last visit.    Patient Stated Goals I want to walk better without hip pain and loss of balance.   Currently in Pain? No/denies            [facility] PT Assessment - 08/27/16 0001      Assessment   Medical Diagnosis Balance problems.   Referring Provider Arville Care, MD   Hand Dominance Right     Precautions   Precautions Fall   Precaution Comments no new falls since last visit     Restrictions   Weight  Bearing Restrictions No     Balance Screen   Has the patient fallen in the past 6 months Yes                     [facility] Adult PT Treatment/Exercise - 08/27/16 0001      Knee/Hip Exercises: Aerobic   Nustep Level 6 x 12 minutes     Knee/Hip Exercises: Standing   Other Standing Knee Exercises Sit to stand with left LE forward 10 reps x 2, just tapping the mat table in the lowest position     Knee/Hip Exercises: Supine   Bridges Limitations 15 reps   Single Leg Bridge 15 reps  lifting left LE     Knee/Hip Exercises: Sidelying   Hip ABduction Strengthening;Right;2 sets;10 reps   Clams 15 x 2 sets with green theraband                PT Education - 08/27/16 1126    Education provided Yes   Education Details HEP issued for single leg bridges and sidelying hip abd   Person(s) Educated Patient   Methods Explanation;Demonstration;Handout;Tactile cues;Verbal cues   Comprehension Verbalized understanding;Returned demonstration             PT Long Term Goals - 08/27/16 1156      PT LONG TERM GOAL #1   Title Independent with a HEP.   Time 8   Period Weeks   Status New     PT LONG TERM GOAL #2   Title Berg score= 53/56.   Time 8   Period Weeks   Status New     PT LONG TERM GOAL #3   Title Walk a community distance without an assisitve device.   Time 8   Period Weeks   Status New               Plan - 08/27/16 1126    Clinical Impression Statement Pt presents today complaining of no pain. Pt tolerated treatment well. Issued Pt HEP for single leg bridges and sidelying hip abduction.    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   PT Frequency 2x / week   PT Duration 8 weeks   PT Treatment/Interventions ADLs/Self Care Home Management;Stair training;Gait training;Therapeutic activities;Therapeutic exercise;Balance training;Neuromuscular  re-education;Patient/family education   PT Next Visit Plan Balance and gait activities.  Please monitor BP; HR and 02 sat.  Bilateral adly hip abduction.   Consulted and Agree with Plan of Care Patient      Patient will benefit from skilled therapeutic intervention in order to improve the following deficits and impairments:  Abnormal gait, Decreased balance, Difficulty walking  Visit Diagnosis: Unsteadiness on feet  Difficulty walking  Muscle weakness (  generalized)     Problem List Patient Active Problem List   Diagnosis Date Noted  . Ischemic cardiomyopathy 06/30/2016  . Diabetes mellitus with complication (HCC)   . Diabetes mellitus type 2 in obese (HCC)   . Acute blood loss anemia   . Myoclonus   . Anoxic brain injury (HCC)   . Seizures (HCC)   . Dilated cardiomyopathy (HCC) 05/24/2016  . Coronary artery disease involving native coronary artery of native heart with angina pectoris (HCC) 05/24/2016  . 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 Status post coronary artery stent placement   . Essential hypertension 09/21/2013  . Pulmonary nodules 10/16/2011    Sharmon Leyden, MPT  08/27/2016, 11:59 AM  [facility] [address] [place], [place], [address] Phone: [phone]   Fax:  [phone]  Name: DORN HARTSHORNE MRN: [id number] Date of Birth: 1965/05/09

## 2016-08-27 NOTE — Patient Instructions (Addendum)
  Lift bottom off floor/bed, extend left leg out, hold 3-5 seconds Repeat 10-15 times Remember to breathe Perform 2 times a day       Lye on your left side lifting your Right Leg up toward the ceiling, keeping your toes pointing forward Repeat 15-20 times Perform 2 times a day

## 2016-09-01 ENCOUNTER — Encounter: Payer: Self-pay | Admitting: Physical Therapy

## 2016-09-01 ENCOUNTER — Ambulatory Visit: Payer: BC Managed Care – PPO | Admitting: Physical Therapy

## 2016-09-01 DIAGNOSIS — R2681 Unsteadiness on feet: Secondary | ICD-10-CM

## 2016-09-01 DIAGNOSIS — M6281 Muscle weakness (generalized): Secondary | ICD-10-CM

## 2016-09-01 DIAGNOSIS — R262 Difficulty in walking, not elsewhere classified: Secondary | ICD-10-CM

## 2016-09-01 NOTE — Therapy (Signed)
[facility] [address] [place], [place], [address] Phone: [phone]   Fax:  [phone]  Physical Therapy Treatment  Patient Details  Name: Douglas Carr MRN: [id number] Date of Birth: [birth year] Referring Provider: Arville Care, MD  Encounter Date: 09/01/2016      PT End of Session - 09/01/16 1118    Visit Number 3   Number of Visits 16   Date for PT Re-Evaluation 10/20/16   PT Start Time 1033   PT Stop Time 1120   PT Time Calculation (min) 47 min   Activity Tolerance Patient tolerated treatment well   Behavior During Therapy [facility] for tasks assessed/performed      Past Medical History:  Diagnosis Date  . Acute ST elevation myocardial infarction (STEMI) involving left circumflex coronary artery (HCC) 05/07/2016   PCI to Cx-OM  . Anginal pain (HCC)    secondary to sm. vessel disease  . Anxiety   . Arthritis    BIL KNEE PAIN AND BIL ANKLE PAIN  . Bone spur of ankle   . Cardiac arrest (HCC) 05/24/2016   with v fib  . 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 Coronary artery disease involving native coronary artery of native heart with angina pectoris (HCC) 05/24/2016   Remote MI at 51 years of age, Last cath 2010-diffuse non-obstructive disease; last echo 06/16/08 -normal LV function, moderate concentric hypertrophy; nuc 08/2008 no ischemia;  medical therapy; STEMI May 07 2016 - PCI to Cx-OM  . Diabetes mellitus    ON ORAL MEDICATION AND INSULIN  . Dilated cardiomyopathy (HCC) 05/24/2016   EF 325-30% by Echo post STEMI (previously 30-35%)   . Hyperlipidemia   . Hypertension   . Myocardial infarction (HCC) 1996   Post MI  . Peripheral vascular disease (HCC)    HAS LEFT CAROTID ARTERY STENOSIS   AND IS S/P RIGHT CAROTID ENDARTERECTOMY 2010 Last carotid dopplers 01/08/2012 wth patent endarterectomy site  . Status post coronary artery stent placement     Past  Surgical History:  Procedure Laterality Date  . APPENDECTOMY    . CARDIAC CATHETERIZATION     FEB 2010, significant branch vessel disease wth diag, marginal, PDA & PLA, nml. LV function  . CARDIAC CATHETERIZATION N/A 05/07/2016   Procedure: Left Heart Cath and Coronary Angiography;  Surgeon: Peter M Swaziland, MD;  Location: [facility] INVASIVE CV LAB;  Service: Cardiovascular;  Laterality: N/A;  . CARDIAC CATHETERIZATION N/A 05/07/2016   Procedure: Coronary Stent Intervention;  Surgeon: Peter M Swaziland, MD;  Location: [facility] INVASIVE CV LAB;  Service: Cardiovascular;  Laterality: N/A;  . CARDIAC CATHETERIZATION N/A 05/24/2016   Procedure: Left Heart Cath and Coronary Angiography;  Surgeon: Marykay Lex, MD;  Location: [facility] INVASIVE CV LAB;  Service: Cardiovascular;  Laterality: N/A;  . CARDIAC CATHETERIZATION N/A 05/24/2016   Procedure: Coronary Stent Intervention;  Surgeon: Marykay Lex, MD;  Location: [facility] INVASIVE CV LAB;  Service: Cardiovascular;  Laterality: N/A;  2.5x20 Promus to Ostial/proximal circumflex  . CAROTID ENDARTERECTOMY  09/2008   Rt CEA  . ICD IMPLANT N/A 06/30/2016   Procedure: ICD Implant;  Surgeon: Will Jorja Loa, MD;  Location: [facility] INVASIVE CV LAB;  Service: Cardiovascular;  Laterality: N/A;  . LUNG BIOPSY  2013   Bx's suggest granulomatous dz.  . RT ANKLE   2013    There were no vitals filed for this visit.      Subjective Assessment -  09/01/16 1036    Subjective Patient arriving to therapy today reporting no pain. No new falls reported since last visit.    Pertinent History MI, HTN, ICD implant.   Patient Stated Goals I want to walk better without hip pain and loss of balance.   Currently in Pain? No/denies                         [facility] Adult PT Treatment/Exercise - 09/01/16 0001      Knee/Hip Exercises: Aerobic   Nustep Level 6 x 10 minutes     Knee/Hip Exercises: Standing   Rocker Board 2 minutes;Other (comment)   Other Standing Knee Exercises  balance on airex x79min CGA             Balance Exercises - 09/01/16 1103      Balance Exercises: Standing   Standing Eyes Opened Narrow base of support (BOS);Wide (BOA);2 reps   Standing Eyes Closed Wide (BOA);Narrow base of support (BOS);Solid surface;2 reps   Tandem Stance 4 reps;Eyes open;Intermittent upper extremity support   Standing, One Foot on a Step Eyes open;6 inch  2x10 CGA toe taps   Sidestepping Foam/compliant support;4 reps  with CGA   Heel Raises Limitations x10                PT Long Term Goals - 09/01/16 1120      PT LONG TERM GOAL #1   Title Independent with a HEP.   Time 8   Period Weeks   Status On-going     PT LONG TERM GOAL #2   Title Berg score= 53/56.   Time 8   Period Weeks   Status On-going     PT LONG TERM GOAL #3   Title Walk a community distance without an assisitve device.   Time 8   Period Weeks   Status On-going               Plan - 09/01/16 1120    Clinical Impression Statement Patient tolerated treatment well today and vitals WNL today. Patient able to progress with balance and standing activities with CGA throughout for safety. No LOB today with treatment. Patient reported that he would like to get back to normal and he has more difficulty with any prolong standing or walking activity. Goals ongoing due to standing/balance deficts.    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  Please monitor BP; HR and 02 sat   PT Home Exercise Plan isue HEP for standing balance    Consulted and Agree with Plan of Care  Patient      Patient will benefit from skilled therapeutic intervention in order to improve the following deficits and impairments:  Abnormal gait, Decreased balance, Difficulty walking  Visit Diagnosis: Unsteadiness on feet  Difficulty walking  Muscle weakness (generalized)     Problem List Patient Active Problem List   Diagnosis Date Noted  . Ischemic cardiomyopathy 06/30/2016  . Diabetes mellitus with complication (HCC)   . Diabetes mellitus type 2 in obese (HCC)   . Acute blood loss anemia   . Myoclonus   . Anoxic  brain injury (HCC)   . Seizures (HCC)   . Dilated cardiomyopathy (HCC) 05/24/2016  . Coronary artery disease involving native coronary artery of native heart with angina pectoris (HCC) 05/24/2016  . 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 Status post coronary artery stent placement   . Essential hypertension 09/21/2013  . Pulmonary nodules 10/16/2011    Hermelinda Dellen, PTA 09/01/2016, 11:27 AM  [facility] [address] [place], [place], [address] Phone: [phone]   Fax:  [phone]  Name: Douglas Carr MRN: [id number] Date of Birth: Nov 21, 1965

## 2016-09-02 ENCOUNTER — Other Ambulatory Visit: Payer: Self-pay | Admitting: Family Medicine

## 2016-09-03 ENCOUNTER — Other Ambulatory Visit: Payer: Self-pay | Admitting: Family Medicine

## 2016-09-03 ENCOUNTER — Encounter: Payer: Self-pay | Admitting: Physical Therapy

## 2016-09-03 ENCOUNTER — Ambulatory Visit: Payer: BC Managed Care – PPO | Attending: Family Medicine | Admitting: Physical Therapy

## 2016-09-03 DIAGNOSIS — M6281 Muscle weakness (generalized): Secondary | ICD-10-CM | POA: Diagnosis present

## 2016-09-03 DIAGNOSIS — R2681 Unsteadiness on feet: Secondary | ICD-10-CM | POA: Insufficient documentation

## 2016-09-03 DIAGNOSIS — R262 Difficulty in walking, not elsewhere classified: Secondary | ICD-10-CM

## 2016-09-03 NOTE — Therapy (Signed)
[facility] [address] [place], [place], [address] Phone: [phone]   Fax:  [phone]  Physical Therapy Treatment  Patient Details  Name: Douglas Carr MRN: [id number] Date of Birth: [birth year] Referring Provider: Arville Care, MD  Encounter Date: 09/03/2016      PT End of Session - 09/03/16 1113    Visit Number 4   Number of Visits 16   Date for PT Re-Evaluation 10/20/16   PT Start Time 1031   PT Stop Time 1113   PT Time Calculation (min) 42 min   Activity Tolerance Patient tolerated treatment well   Behavior During Therapy [facility] for tasks assessed/performed      Past Medical History:  Diagnosis Date  . Acute ST elevation myocardial infarction (STEMI) involving left circumflex coronary artery (HCC) 05/07/2016   PCI to Cx-OM  . Anginal pain (HCC)    secondary to sm. vessel disease  . Anxiety   . Arthritis    BIL KNEE PAIN AND BIL ANKLE PAIN  . Bone spur of ankle   . Cardiac arrest (HCC) 05/24/2016   with v fib  . 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 Coronary artery disease involving native coronary artery of native heart with angina pectoris (HCC) 05/24/2016   Remote MI at 51 years of age, Last cath 2010-diffuse non-obstructive disease; last echo 06/16/08 -normal LV function, moderate concentric hypertrophy; nuc 08/2008 no ischemia;  medical therapy; STEMI May 07 2016 - PCI to Cx-OM  . Diabetes mellitus    ON ORAL MEDICATION AND INSULIN  . Dilated cardiomyopathy (HCC) 05/24/2016   EF 325-30% by Echo post STEMI (previously 30-35%)   . Hyperlipidemia   . Hypertension   . Myocardial infarction (HCC) 1996   Post MI  . Peripheral vascular disease (HCC)    HAS LEFT CAROTID ARTERY STENOSIS   AND IS S/P RIGHT CAROTID ENDARTERECTOMY 2010 Last carotid dopplers 01/08/2012 wth patent endarterectomy site  . Status post coronary artery stent placement     Past  Surgical History:  Procedure Laterality Date  . APPENDECTOMY    . CARDIAC CATHETERIZATION     FEB 2010, significant branch vessel disease wth diag, marginal, PDA & PLA, nml. LV function  . CARDIAC CATHETERIZATION N/A 05/07/2016   Procedure: Left Heart Cath and Coronary Angiography;  Surgeon: Peter M Swaziland, MD;  Location: [facility] INVASIVE CV LAB;  Service: Cardiovascular;  Laterality: N/A;  . CARDIAC CATHETERIZATION N/A 05/07/2016   Procedure: Coronary Stent Intervention;  Surgeon: Peter M Swaziland, MD;  Location: [facility] INVASIVE CV LAB;  Service: Cardiovascular;  Laterality: N/A;  . CARDIAC CATHETERIZATION N/A 05/24/2016   Procedure: Left Heart Cath and Coronary Angiography;  Surgeon: Marykay Lex, MD;  Location: [facility] INVASIVE CV LAB;  Service: Cardiovascular;  Laterality: N/A;  . CARDIAC CATHETERIZATION N/A 05/24/2016   Procedure: Coronary Stent Intervention;  Surgeon: Marykay Lex, MD;  Location: [facility] INVASIVE CV LAB;  Service: Cardiovascular;  Laterality: N/A;  2.5x20 Promus to Ostial/proximal circumflex  . CAROTID ENDARTERECTOMY  09/2008   Rt CEA  . ICD IMPLANT N/A 06/30/2016   Procedure: ICD Implant;  Surgeon: Will Jorja Loa, MD;  Location: [facility] INVASIVE CV LAB;  Service: Cardiovascular;  Laterality: N/A;  . LUNG BIOPSY  2013   Bx's suggest granulomatous dz.  . RT ANKLE   2013    There were no vitals filed for this visit.      Subjective Assessment -  09/03/16 1040    Subjective Patient arriving to therapy today reporting no pain. No new falls reported since last visit.    Pertinent History MI, HTN, ICD implant.   Patient Stated Goals I want to walk better without hip pain and loss of balance.   Currently in Pain? No/denies                         [facility] Adult PT Treatment/Exercise - 09/03/16 0001      Knee/Hip Exercises: Aerobic   Nustep L6 UE/LE             Balance Exercises - 09/03/16 1053      Balance Exercises: Standing   Standing Eyes Opened Narrow  base of support (BOS);Wide (BOA);Foam/compliant surface;Solid surface;Time  x45min   Standing, One Foot on a Step Eyes open;8 inch;4 reps   Rockerboard Anterior/posterior    Retro Gait 5 reps   Sidestepping Foam/compliant support    Marching Limitations 2x10   Other Standing Exercises standing on airex with cone stacking on shelf overhead   Overall Comments Other (comment)  SLS with toe tap on cone forward and side bil LE x13min                PT Long Term Goals - 09/01/16 1120      PT LONG TERM GOAL #1   Title Independent with a HEP.   Time 8   Period Weeks   Status On-going     PT LONG TERM GOAL #2   Title Berg score= 53/56.   Time 8   Period Weeks   Status On-going     PT LONG TERM GOAL #3   Title Walk a community distance without an assisitve device.   Time 8   Period Weeks   Status On-going               Plan - 09/03/16 1117    Clinical Impression Statement Patient tolerated treatment well today. Patient vitals WNL thoroughout tx today. Patient progressing with standing balance exercises with CGA for safety. No LOB today. Patient reported walking daily without assist device unless he has a long distance he will use FWW. Patient goals progressing yet ongoing due to balance deficts.   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  Please monitor BP; HR and 02 sat   Consulted and Agree with Plan of Care Patient      Patient will benefit from skilled therapeutic intervention in order to improve the following deficits and impairments:   Abnormal gait, Decreased balance, Difficulty walking  Visit Diagnosis: Unsteadiness on feet  Difficulty walking  Muscle weakness (generalized)     Problem List Patient Active Problem List   Diagnosis Date Noted  . Ischemic cardiomyopathy 06/30/2016  . Diabetes mellitus with complication (HCC)   . Diabetes mellitus type 2 in obese (HCC)   . Acute blood loss anemia   . Myoclonus   . Anoxic brain injury (HCC)   . Seizures (HCC)   . Dilated cardiomyopathy (HCC) 05/24/2016  . Coronary artery disease involving native coronary artery of native heart with angina  pectoris (HCC) 05/24/2016  . 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 Status post coronary artery stent placement   . Essential hypertension 09/21/2013  . Pulmonary nodules 10/16/2011    Hermelinda Dellen, PTA 09/03/2016, 11:21 AM  [facility] [address] [place], [place], [address] Phone: [phone]   Fax:  [phone]  Name: Douglas Carr MRN: [id number] Date of Birth: October 22, 1965

## 2016-09-04 ENCOUNTER — Other Ambulatory Visit: Payer: Self-pay | Admitting: Family Medicine

## 2016-09-05 ENCOUNTER — Other Ambulatory Visit: Payer: Self-pay | Admitting: Family Medicine

## 2016-09-08 ENCOUNTER — Other Ambulatory Visit: Payer: Self-pay | Admitting: Family Medicine

## 2016-09-08 ENCOUNTER — Encounter: Payer: Self-pay | Admitting: Physical Therapy

## 2016-09-08 ENCOUNTER — Ambulatory Visit: Payer: BC Managed Care – PPO | Admitting: Physical Therapy

## 2016-09-08 DIAGNOSIS — R2681 Unsteadiness on feet: Secondary | ICD-10-CM | POA: Diagnosis not present

## 2016-09-08 DIAGNOSIS — M6281 Muscle weakness (generalized): Secondary | ICD-10-CM

## 2016-09-08 DIAGNOSIS — R262 Difficulty in walking, not elsewhere classified: Secondary | ICD-10-CM

## 2016-09-08 NOTE — Therapy (Signed)
[facility] [address] [place], [place], [address] Phone: [phone]   Fax:  [phone]  Physical Therapy Treatment  Patient Details  Name: ROLAND LIPKE MRN: [id number] Date of Birth: March 07, 1966 Referring Provider: Arville Care, MD  Encounter Date: 09/08/2016      PT End of Session - 09/08/16 1123    Visit Number 5   Number of Visits 16   Date for PT Re-Evaluation 10/20/16   PT Start Time 1030   PT Stop Time 1115   PT Time Calculation (min) 45 min   Activity Tolerance Patient tolerated treatment well   Behavior During Therapy [facility] for tasks assessed/performed      Past Medical History:  Diagnosis Date  . Acute ST elevation myocardial infarction (STEMI) involving left circumflex coronary artery (HCC) 05/07/2016   PCI to Cx-OM  . Anginal pain (HCC)    secondary to sm. vessel disease  . Anxiety   . Arthritis    BIL KNEE PAIN AND BIL ANKLE PAIN  . Bone spur of ankle   . Cardiac arrest (HCC) 05/24/2016   with v fib  . 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 Coronary artery disease involving native coronary artery of native heart with angina pectoris (HCC) 05/24/2016   Remote MI at 51 years of age, Last cath 2010-diffuse non-obstructive disease; last echo 06/16/08 -normal LV function, moderate concentric hypertrophy; nuc 08/2008 no ischemia;  medical therapy; STEMI May 07 2016 - PCI to Cx-OM  . Diabetes mellitus    ON ORAL MEDICATION AND INSULIN  . Dilated cardiomyopathy (HCC) 05/24/2016   EF 325-30% by Echo post STEMI (previously 30-35%)   . Hyperlipidemia   . Hypertension   . Myocardial infarction (HCC) 1996   Post MI  . Peripheral vascular disease (HCC)    HAS LEFT CAROTID ARTERY STENOSIS   AND IS S/P RIGHT CAROTID ENDARTERECTOMY 2010 Last carotid dopplers 01/08/2012 wth patent endarterectomy site  . Status post coronary artery stent placement     Past  Surgical History:  Procedure Laterality Date  . APPENDECTOMY    . CARDIAC CATHETERIZATION     FEB 2010, significant branch vessel disease wth diag, marginal, PDA & PLA, nml. LV function  . CARDIAC CATHETERIZATION N/A 05/07/2016   Procedure: Left Heart Cath and Coronary Angiography;  Surgeon: Peter M Swaziland, MD;  Location: [facility] INVASIVE CV LAB;  Service: Cardiovascular;  Laterality: N/A;  . CARDIAC CATHETERIZATION N/A 05/07/2016   Procedure: Coronary Stent Intervention;  Surgeon: Peter M Swaziland, MD;  Location: [facility] INVASIVE CV LAB;  Service: Cardiovascular;  Laterality: N/A;  . CARDIAC CATHETERIZATION N/A 05/24/2016   Procedure: Left Heart Cath and Coronary Angiography;  Surgeon: Marykay Lex, MD;  Location: [facility] INVASIVE CV LAB;  Service: Cardiovascular;  Laterality: N/A;  . CARDIAC CATHETERIZATION N/A 05/24/2016   Procedure: Coronary Stent Intervention;  Surgeon: Marykay Lex, MD;  Location: [facility] INVASIVE CV LAB;  Service: Cardiovascular;  Laterality: N/A;  2.5x20 Promus to Ostial/proximal circumflex  . CAROTID ENDARTERECTOMY  09/2008   Rt CEA  . ICD IMPLANT N/A 06/30/2016   Procedure: ICD Implant;  Surgeon: Will Jorja Loa, MD;  Location: [facility] INVASIVE CV LAB;  Service: Cardiovascular;  Laterality: N/A;  . LUNG BIOPSY  2013   Bx's suggest granulomatous dz.  . RT ANKLE   2013    There were no vitals filed for this visit.      Subjective Assessment -  09/08/16 1121    Subjective Pt arriving to therapy reporting no pain. No new falls reported. Pt did report fatigue after going with her daughter to her softball games this weekend. Pt reported having to use his rollator walker for all amb and required several sitting breaks.    Pertinent History MI, HTN, ICD implant.   Patient Stated Goals I want to walk better without hip pain and loss of balance.   Currently in Pain? No/denies            [facility] PT Assessment - 09/08/16 0001      Assessment   Medical Diagnosis balance problems    Referring Provider Arville Care, MD   Hand Dominance Right     Precautions   Precautions Fall   Precaution Comments no falls since last visit     Restrictions   Weight Bearing Restrictions No     Balance Screen   Has the patient fallen in the past 6 months Yes                     [facility] Adult PT Treatment/Exercise - 09/08/16 0001      Ambulation/Gait   Gait Comments Amb up and down 4 steps with left hand rail with SBA, Pt amb on grass lawn surface up and down small inclines with CGA. Pt with ataxtic gait pattern with short step and cautionous gait. No device used. Pt required 2 standing rest breaks due to fatigue.      Knee/Hip Exercises: Aerobic   Recumbent Bike level 4, 7 minutes     Knee/Hip Exercises: Standing   Rocker Board 3 minutes   Other Standing Knee Exercises balance on airex feet together, feet staggered             Balance Exercises - 09/08/16 1110      Balance Exercises: Standing   Other Standing Exercises side stepping, 3 steps both directions x 4 with CGA, Forward lunges 5 reps x 2  sets with CGA concentration on balance and push off.            PT Education - 09/08/16 1122    Education Details yes, instructed in standing balance exercises at kitchen sink for tandum stance with eyes open   Person(s) Educated Patient   Methods Explanation;Demonstration;Verbal cues   Comprehension Verbalized understanding;Returned demonstration             PT Long Term Goals - 09/08/16 1131      PT LONG TERM GOAL #1   Title Independent with a HEP.   Time 8   Period Weeks   Status On-going     PT LONG TERM GOAL #2   Title Berg score= 53/56.   Period Weeks   Status On-going     PT LONG TERM GOAL #3   Title Walk a community distance without an assisitve device.   Period Weeks   Status On-going               Plan - 09/08/16 1124    Clinical Impression Statement Patient tolerated treatment well requiring standing and sitting  rest breaks due to fatigue. Pt making progress with standing balance exercises still requiring CGA for safety. Pt also working on amb on uneven surfaces outside on the grass with CGA. Pt with 3 LOB and required CGA to recover. Pt with increased instability with staggered stance. Continue with skilled PT as pt tolerates progressing toward his goals set.    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  Please monitor BP; HR and 02 sat   PT Home Exercise Plan isue HEP for standing balance    Consulted and Agree with Plan of Care Patient      Patient will benefit from skilled therapeutic intervention in order to improve the following deficits and impairments:  Abnormal gait, Decreased balance, Difficulty walking  Visit Diagnosis: Unsteadiness on feet  Difficulty walking  Muscle weakness (generalized)     Problem List Patient Active Problem List   Diagnosis Date Noted  . Ischemic cardiomyopathy 06/30/2016  . Diabetes mellitus with complication (HCC)   . Diabetes mellitus type 2 in obese (HCC)   . Acute blood loss anemia   . Myoclonus   . Anoxic brain injury (HCC)   . Seizures (HCC)   . Dilated cardiomyopathy (HCC) 05/24/2016  . Coronary artery disease involving native coronary artery of native heart with angina pectoris (HCC) 05/24/2016  . 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 Status post coronary artery stent placement   . Essential hypertension 09/21/2013  . Pulmonary nodules 10/16/2011    Sharmon LeydenJennifer R Martin,  MPT 09/08/2016, 11:33 AM  [facility] [address] [place], [place], [address] Phone: [phone]   Fax:  [phone]  Name: Margarita SermonsClifford M Fontes MRN: [id number] Date of Birth: 10-16-1965

## 2016-09-10 ENCOUNTER — Other Ambulatory Visit: Payer: Self-pay | Admitting: Family Medicine

## 2016-09-10 ENCOUNTER — Encounter: Payer: Self-pay | Admitting: Physical Therapy

## 2016-09-10 ENCOUNTER — Ambulatory Visit: Payer: BC Managed Care – PPO | Admitting: Physical Therapy

## 2016-09-10 DIAGNOSIS — R2681 Unsteadiness on feet: Secondary | ICD-10-CM | POA: Diagnosis not present

## 2016-09-10 DIAGNOSIS — M6281 Muscle weakness (generalized): Secondary | ICD-10-CM

## 2016-09-10 DIAGNOSIS — R262 Difficulty in walking, not elsewhere classified: Secondary | ICD-10-CM

## 2016-09-10 NOTE — Telephone Encounter (Signed)
Last filled 08/07/16, last seen 08/13/16. Route to pool for call in

## 2016-09-10 NOTE — Telephone Encounter (Signed)
Go ahead and call in a refill for the patient for one month, he will have to follow up with Dr. Ermalinda MemosBradshaw for this in the future.

## 2016-09-10 NOTE — Telephone Encounter (Signed)
Rx called in 

## 2016-09-10 NOTE — Therapy (Signed)
[facility] [address] [place], [place], [address] Phone: [phone]   Fax:  [phone]  Physical Therapy Treatment  Patient Details  Name: Douglas Carr MRN: [id number] Date of Birth: [birth year] Referring Provider: Arville Care, MD  Encounter Date: 09/10/2016      PT End of Session - 09/10/16 1116    Visit Number 6   Number of Visits 16   Date for PT Re-Evaluation 10/20/16   PT Start Time 1033   PT Stop Time 1114   PT Time Calculation (min) 41 min   Activity Tolerance Patient tolerated treatment well   Behavior During Therapy [facility] for tasks assessed/performed      Past Medical History:  Diagnosis Date  . Acute ST elevation myocardial infarction (STEMI) involving left circumflex coronary artery (HCC) 05/07/2016   PCI to Cx-OM  . Anginal pain (HCC)    secondary to sm. vessel disease  . Anxiety   . Arthritis    BIL KNEE PAIN AND BIL ANKLE PAIN  . Bone spur of ankle   . Cardiac arrest (HCC) 05/24/2016   with v fib  . 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 Coronary artery disease involving native coronary artery of native heart with angina pectoris (HCC) 05/24/2016   Remote MI at 51 years of age, Last cath 2010-diffuse non-obstructive disease; last echo 06/16/08 -normal LV function, moderate concentric hypertrophy; nuc 08/2008 no ischemia;  medical therapy; STEMI May 07 2016 - PCI to Cx-OM  . Diabetes mellitus    ON ORAL MEDICATION AND INSULIN  . Dilated cardiomyopathy (HCC) 05/24/2016   EF 325-30% by Echo post STEMI (previously 30-35%)   . Hyperlipidemia   . Hypertension   . Myocardial infarction (HCC) 1996   Post MI  . Peripheral vascular disease (HCC)    HAS LEFT CAROTID ARTERY STENOSIS   AND IS S/P RIGHT CAROTID ENDARTERECTOMY 2010 Last carotid dopplers 01/08/2012 wth patent endarterectomy site  . Status post coronary artery stent placement     Past  Surgical History:  Procedure Laterality Date  . APPENDECTOMY    . CARDIAC CATHETERIZATION     FEB 2010, significant branch vessel disease wth diag, marginal, PDA & PLA, nml. LV function  . CARDIAC CATHETERIZATION N/A 05/07/2016   Procedure: Left Heart Cath and Coronary Angiography;  Surgeon: Peter M Swaziland, MD;  Location: [facility] INVASIVE CV LAB;  Service: Cardiovascular;  Laterality: N/A;  . CARDIAC CATHETERIZATION N/A 05/07/2016   Procedure: Coronary Stent Intervention;  Surgeon: Peter M Swaziland, MD;  Location: [facility] INVASIVE CV LAB;  Service: Cardiovascular;  Laterality: N/A;  . CARDIAC CATHETERIZATION N/A 05/24/2016   Procedure: Left Heart Cath and Coronary Angiography;  Surgeon: Marykay Lex, MD;  Location: [facility] INVASIVE CV LAB;  Service: Cardiovascular;  Laterality: N/A;  . CARDIAC CATHETERIZATION N/A 05/24/2016   Procedure: Coronary Stent Intervention;  Surgeon: Marykay Lex, MD;  Location: [facility] INVASIVE CV LAB;  Service: Cardiovascular;  Laterality: N/A;  2.5x20 Promus to Ostial/proximal circumflex  . CAROTID ENDARTERECTOMY  09/2008   Rt CEA  . ICD IMPLANT N/A 06/30/2016   Procedure: ICD Implant;  Surgeon: Will Jorja Loa, MD;  Location: [facility] INVASIVE CV LAB;  Service: Cardiovascular;  Laterality: N/A;  . LUNG BIOPSY  2013   Bx's suggest granulomatous dz.  . RT ANKLE   2013    There were no vitals filed for this visit.      Subjective Assessment -  09/10/16 1038    Subjective Patient reported doing good after last treatment. Patient wearing left knee brace due to soreness after attempting to kick his dog after the dog took his food   Pertinent History MI, HTN, ICD implant.   Patient Stated Goals I want to walk better without hip pain and loss of balance.   Currently in Pain? No/denies            [facility] PT Assessment - 09/10/16 0001      Berg Balance Test   Sit to Stand Able to stand without using hands and stabilize independently   Standing Unsupported Able to stand 2 minutes with  supervision   Sitting with Back Unsupported but Feet Supported on Floor or Stool Able to sit safely and securely 2 minutes   Stand to Sit Sits safely with minimal use of hands   Transfers Able to transfer safely, minor use of hands   Standing Unsupported with Eyes Closed Able to stand 10 seconds safely   Standing Ubsupported with Feet Together Able to place feet together independently and stand 1 minute safely   From Standing, Reach Forward with Outstretched Arm Can reach confidently >25 cm (10")   From Standing Position, Pick up Object from Floor Able to pick up shoe safely and easily   From Standing Position, Turn to Look Behind Over each Shoulder Looks behind from both sides and weight shifts well   Turn 360 Degrees Able to turn 360 degrees safely one side only in 4 seconds or less   Standing Unsupported, Alternately Place Feet on Step/Stool Able to stand independently and complete 8 steps >20 seconds   Standing Unsupported, One Foot in Front Able to plae foot ahead of the other independently and hold 30 seconds   Standing on One Leg Able to lift leg independently and hold equal to or more than 3 seconds   Total Score 50                     [facility] Adult PT Treatment/Exercise - 09/10/16 0001      Knee/Hip Exercises: Aerobic   Nustep L5 x6915min UE/LE activity             Balance Exercises - 09/10/16 1057      Balance Exercises: Standing   Standing Eyes Opened Narrow base of support (BOS);Wide (BOA);Solid surface;Foam/compliant surface;4 reps   Standing Eyes Closed Wide (BOA);Time   Tandem Stance Eyes open;Intermittent upper extremity support;4 reps   SLS with Vectors Solid surface   Standing, One Foot on a Step Eyes open;8 inch;4 reps   Rockerboard Anterior/posterior  3min   Step Ups Forward;6 inch  2x10   Overall Comments Other (comment)  standing SLS with toe taps foward/side with balance pods     Balance Exercises: Seated   Static Sitting Eyes closed  on  green swiss ball x531min   Dynamic Sitting Eyes opened;Upper extremity support - 1  on green swiss ball with alt LE marching 2x10   Heel Raises Both;10 reps  sitting on green swiss ball no UE support   Toe Raise 10 reps;Both  sitting on green swiss ball with no UE support                PT Long Term Goals - 09/10/16 1118      PT LONG TERM GOAL #1   Title Independent with a HEP.   Time 8   Period Weeks   Status On-going  PT LONG TERM GOAL #2   Title Berg score= 53/56.   Time 8   Period Weeks   Status On-going  50/56 09/10/16     PT LONG TERM GOAL #3   Title Walk a community distance without an assisitve device.   Time 8   Period Weeks   Status On-going  using walking for community distance currently 09/10/16               Plan - 09/10/16 1118    Clinical Impression Statement Patient tolerated treatment well today. Patient able to progress balance exercise sitting on ball today with CGA/SBA with difficulty with core stabilization noted with progression. Patient able to stabilize core with bil LE on floor and visual pelvic instability with movement when  marching today. Patient improved BERG score to 50/56. Goals ongoing due to balance deficts.  Vitals WNL.   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  Please monitor BP; HR and 02 sat   Consulted and Agree with Plan of Care Patient      Patient will benefit from skilled therapeutic intervention in order to improve the following deficits and impairments:  Abnormal gait, Decreased balance, Difficulty  walking  Visit Diagnosis: Unsteadiness on feet  Difficulty walking  Muscle weakness (generalized)     Problem List Patient Active Problem List   Diagnosis Date Noted  . Ischemic cardiomyopathy 06/30/2016  . Diabetes mellitus with complication (HCC)   . Diabetes mellitus type 2 in obese (HCC)   . Acute blood loss anemia   . Myoclonus   . Anoxic brain injury (HCC)   . Seizures (HCC)   . Dilated cardiomyopathy (HCC) 05/24/2016  . Coronary artery disease involving native coronary artery of native heart with angina pectoris (HCC) 05/24/2016  . 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 Status post coronary artery stent placement   . Essential hypertension 09/21/2013  . Pulmonary nodules 10/16/2011    Hermelinda Dellen, PTA 09/10/2016, 11:24 AM  [facility] [address] [place], [place], [address] Phone: [phone]   Fax:  [phone]  Name: Douglas Carr MRN: [id number] Date of Birth: [birth year]

## 2016-09-15 ENCOUNTER — Ambulatory Visit: Payer: BC Managed Care – PPO | Admitting: Physical Therapy

## 2016-09-15 NOTE — Therapy (Signed)
[facility] [address] [place], [place], [address] Phone: [phone]   Fax:  [phone]  Physical Therapy Treatment  Patient Details  Name: Douglas SermonsClifford M Neuhaus MRN: [id number] Date of Birth: Apr 12, 1966 Referring Provider: Arville Carejoshua dettinger, MD  Encounter Date: 09/15/2016    Past Medical History:  Diagnosis Date  . Acute ST elevation myocardial infarction (STEMI) involving left circumflex coronary artery (HCC) 05/07/2016   PCI to Cx-OM  . Anginal pain (HCC)    secondary to sm. vessel disease  . Anxiety   . Arthritis    BIL KNEE PAIN AND BIL ANKLE PAIN  . Bone spur of ankle   . Cardiac arrest (HCC) 05/24/2016   with v fib  . 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 Coronary artery disease involving native coronary artery of native heart with angina pectoris (HCC) 05/24/2016   Remote MI at 51 years of age, Last cath 2010-diffuse non-obstructive disease; last echo 06/16/08 -normal LV function, moderate concentric hypertrophy; nuc 08/2008 no ischemia;  medical therapy; STEMI May 07 2016 - PCI to Cx-OM  . Diabetes mellitus    ON ORAL MEDICATION AND INSULIN  . Dilated cardiomyopathy (HCC) 05/24/2016   EF 325-30% by Echo post STEMI (previously 30-35%)   . Hyperlipidemia   . Hypertension   . Myocardial infarction (HCC) 1996   Post MI  . Peripheral vascular disease (HCC)    HAS LEFT CAROTID ARTERY STENOSIS   AND IS S/P RIGHT CAROTID ENDARTERECTOMY 2010 Last carotid dopplers 01/08/2012 wth patent endarterectomy site  . Status post coronary artery stent placement     Past Surgical History:  Procedure Laterality Date  . APPENDECTOMY    . CARDIAC CATHETERIZATION     FEB 2010, significant branch vessel disease wth diag, marginal, PDA & PLA, nml. LV function  . CARDIAC CATHETERIZATION N/A 05/07/2016   Procedure: Left Heart Cath and Coronary Angiography;  Surgeon: Peter M SwazilandJordan, MD;   [facility];  Service: Cardiovascular;  Laterality: N/A;  . CARDIAC CATHETERIZATION N/A 05/07/2016   Procedure: Coronary Stent Intervention;  Surgeon: Peter M SwazilandJordan, MD;  [facility];  Service: Cardiovascular;  Laterality: N/A;  . CARDIAC CATHETERIZATION N/A 05/24/2016   Procedure: Left Heart Cath and Coronary Angiography;  Surgeon: Marykay Lexavid W Harding, MD;  [facility];  Service: Cardiovascular;  Laterality: N/A;  . CARDIAC CATHETERIZATION N/A 05/24/2016   Procedure: Coronary Stent Intervention;  Surgeon: Marykay Lexavid W Harding, MD;  [facility];  Service: Cardiovascular;  Laterality: N/A;  2.5x20 Promus to Ostial/proximal circumflex  . CAROTID ENDARTERECTOMY  09/2008   Rt CEA  . ICD IMPLANT N/A 06/30/2016   Procedure: ICD Implant;  Surgeon: Will Jorja LoaMartin Camnitz, MD;  Location: [facility] INVASIVE CV LAB;  Service: Cardiovascular;  Laterality: N/A;  . LUNG BIOPSY  2013   Bx's suggest granulomatous dz.  . RT ANKLE   2013    There were no vitals filed for this visit.     Therapist had unscheduled appt, patient arrived with no charge                               PT Long Term Goals - 09/10/16 1118      PT LONG TERM GOAL #1   Title Independent with a HEP.   Time 8   Period Weeks   Status  On-going     PT LONG TERM GOAL #2   Title Berg score= 53/56.   Time 8   Period Weeks   Status On-going  50/56 09/10/16     PT LONG TERM GOAL #3   Title Walk a community distance without an assisitve device.   Time 8   Period Weeks   Status On-going  using walking for community distance currently 09/10/16             Patient will benefit from skilled therapeutic intervention in order to improve the following deficits and impairments:     Visit Diagnosis: Unsteadiness on feet  Difficulty walking  Muscle weakness (generalized)     Problem List Patient Active Problem List   Diagnosis Date Noted  . Ischemic  cardiomyopathy 06/30/2016  . Diabetes mellitus with complication (HCC)   . Diabetes mellitus type 2 in obese (HCC)   . Acute blood loss anemia   . Myoclonus   . Anoxic brain injury (HCC)   . Seizures (HCC)   . Dilated cardiomyopathy (HCC) 05/24/2016  . Coronary artery disease involving native coronary artery of native heart with angina pectoris (HCC) 05/24/2016  . 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 Status post coronary artery stent placement   . Essential hypertension 09/21/2013  . Pulmonary nodules 10/16/2011    Douglas Carr, PTA 09/15/2016, 11:55 AM  [facility] [address] [place], [place], [address] Phone: [phone]   Fax:  [phone]  Name: Douglas Carr MRN: [id number] Date of Birth: March 11, 1966

## 2016-09-17 ENCOUNTER — Ambulatory Visit: Payer: BC Managed Care – PPO | Admitting: Physical Therapy

## 2016-09-17 ENCOUNTER — Encounter: Payer: Self-pay | Admitting: Physical Therapy

## 2016-09-17 DIAGNOSIS — M6281 Muscle weakness (generalized): Secondary | ICD-10-CM

## 2016-09-17 DIAGNOSIS — R262 Difficulty in walking, not elsewhere classified: Secondary | ICD-10-CM

## 2016-09-17 DIAGNOSIS — R2681 Unsteadiness on feet: Secondary | ICD-10-CM

## 2016-09-17 NOTE — Therapy (Signed)
[facility] [address] [place], [place], [address] Phone: [phone]   Fax:  [phone]  Physical Therapy Treatment  Patient Details  Name: Douglas Carr MRN: [id number] Date of Birth: [birth year] Referring Provider: Arville Care, MD  Encounter Date: 09/17/2016      PT End of Session - 09/17/16 1202    Visit Number 7   Number of Visits 16   Date for PT Re-Evaluation 10/20/16   PT Start Time 1118   PT Stop Time 1202   PT Time Calculation (min) 44 min   Activity Tolerance Patient tolerated treatment well   Behavior During Therapy [facility] for tasks assessed/performed      Past Medical History:  Diagnosis Date  . Acute ST elevation myocardial infarction (STEMI) involving left circumflex coronary artery (HCC) 05/07/2016   PCI to Cx-OM  . Anginal pain (HCC)    secondary to sm. vessel disease  . Anxiety   . Arthritis    BIL KNEE PAIN AND BIL ANKLE PAIN  . Bone spur of ankle   . Cardiac arrest (HCC) 05/24/2016   with v fib  . 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 Coronary artery disease involving native coronary artery of native heart with angina pectoris (HCC) 05/24/2016   Remote MI at 50 years of age, Last cath 2010-diffuse non-obstructive disease; last echo 06/16/08 -normal LV function, moderate concentric hypertrophy; nuc 08/2008 no ischemia;  medical therapy; STEMI May 07 2016 - PCI to Cx-OM  . Diabetes mellitus    ON ORAL MEDICATION AND INSULIN  . Dilated cardiomyopathy (HCC) 05/24/2016   EF 325-30% by Echo post STEMI (previously 30-35%)   . Hyperlipidemia   . Hypertension   . Myocardial infarction (HCC) 1996   Post MI  . Peripheral vascular disease (HCC)    HAS LEFT CAROTID ARTERY STENOSIS   AND IS S/P RIGHT CAROTID ENDARTERECTOMY 2010 Last carotid dopplers 01/08/2012 wth patent endarterectomy site  . Status post coronary artery stent placement     Past  Surgical History:  Procedure Laterality Date  . APPENDECTOMY    . CARDIAC CATHETERIZATION     FEB 2010, significant branch vessel disease wth diag, marginal, PDA & PLA, nml. LV function  . CARDIAC CATHETERIZATION N/A 05/07/2016   Procedure: Left Heart Cath and Coronary Angiography;  Surgeon: Peter M Swaziland, MD;  [facility]: [facility] INVASIVE CV LAB;  Service: Cardiovascular;  Laterality: N/A;  . CARDIAC CATHETERIZATION N/A 05/07/2016   Procedure: Coronary Stent Intervention;  Surgeon: Peter M Swaziland, MD;  Location: [facility] INVASIVE CV LAB;  Service: Cardiovascular;  Laterality: N/A;  . CARDIAC CATHETERIZATION N/A 05/24/2016   Procedure: Left Heart Cath and Coronary Angiography;  Surgeon: Marykay Lex, MD;  [facility] CV LAB;  Service: Cardiovascular;  Laterality: N/A;  . CARDIAC CATHETERIZATION N/A 05/24/2016   Procedure: Coronary Stent Intervention;  Surgeon: Marykay Lex, MD;  Location: [facility] INVASIVE CV LAB;  Service: Cardiovascular;  Laterality: N/A;  2.5x20 Promus to Ostial/proximal circumflex  . CAROTID ENDARTERECTOMY  09/2008   Rt CEA  . ICD IMPLANT N/A 06/30/2016   Procedure: ICD Implant;  Surgeon: Will Jorja Loa, MD;  Location: [facility] INVASIVE CV LAB;  Service: Cardiovascular;  Laterality: N/A;  . LUNG BIOPSY  2013   Bx's suggest granulomatous dz.  . RT ANKLE   2013    There were no vitals filed for this visit.      Subjective Assessment -  09/17/16 1123    Subjective Patient reported doing good overall and no complaints after last treatment   Pertinent History MI, HTN, ICD implant.   Patient Stated Goals I want to walk better without hip pain and loss of balance.   Currently in Pain? No/denies                         [facility] Adult [facility] Treatment/Exercise - 09/17/16 0001      Knee/Hip Exercises: Aerobic   Nustep L5 x6315min UE/LE activity     Knee/Hip Exercises: Standing   Other Standing Knee Exercises manual and cane for restistance transverse plane standing for  core activation     Knee/Hip Exercises: Supine   Bridges with Clamshell Strengthening;Both;20 reps  with green t-band   Other Supine Knee/Hip Exercises supine maunual resistance for core activation on pts bil UE at 90 degrees             Balance Exercises - 09/17/16 1147      Balance Exercises: Standing   Rockerboard Anterior/posterior  5min   Step Ups Forward;6 inch  2x10   Heel Raises Limitations x10   Toe Raise Limitations x10   Overall Comments Other (comment)  SLS to taps on balance pods forward to side     Balance Exercises: Seated   Dynamic Sitting Eyes opened;Upper extremity support - 1  on green swiss ball 2x10 LE march   Heel Raises Both;10 reps  green swiss ball   Toe Raise 10 reps;Both  green swiss ball                 PT Long Term Goals - 09/10/16 1118      PT LONG TERM GOAL #1   Title Independent with a HEP.   Time 8   Period Weeks   Status On-going     PT LONG TERM GOAL #2   Title Berg score= 53/56.   Time 8   Period Weeks   Status On-going  50/56 09/10/16     PT LONG TERM GOAL #3   Title Walk a community distance without an assisitve device.   Time 8   Period Weeks   Status On-going  using walking for community distance currently 09/10/16               Plan - 09/17/16 1203    Clinical Impression Statement Patient tolerated treatment well with no LOB today. Patient able to progress core activation and balance activities with resistance with no LOB. Patient able to perform seated on ball balance exercises with less assist. Patient progresisng with goals yet balance defict remains. vitals WNL.   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  Please monitor BP; HR and 02 sat   Consulted and Agree with Plan of Care Patient      Patient will benefit from skilled therapeutic intervention in order to improve the following deficits and impairments:  Abnormal gait, Decreased balance, Difficulty walking  Visit Diagnosis: Unsteadiness on feet  Difficulty walking  Muscle weakness (generalized)     Problem List Patient Active Problem List   Diagnosis Date Noted  .  Ischemic cardiomyopathy 06/30/2016  . Diabetes mellitus with complication (HCC)   . Diabetes mellitus type 2 in obese (HCC)   . Acute blood loss anemia   . Myoclonus   . Anoxic brain injury (HCC)   . Seizures (HCC)   . Dilated cardiomyopathy (HCC) 05/24/2016  . Coronary artery disease involving native coronary artery of native heart with angina pectoris (HCC) 05/24/2016  . 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 Status post coronary artery stent placement   . Essential hypertension 09/21/2013  . Pulmonary nodules 10/16/2011    Hermelinda Dellen, PTA 09/17/2016, 12:06 PM  [facility] [address] [place], [place], [address] Phone: [phone]   Fax:  [phone]  Name: Douglas Carr MRN: [id number] Date of Birth: Sep 02, 1965

## 2016-09-22 ENCOUNTER — Ambulatory Visit: Payer: BC Managed Care – PPO | Admitting: Physical Therapy

## 2016-09-22 DIAGNOSIS — R2681 Unsteadiness on feet: Secondary | ICD-10-CM | POA: Diagnosis not present

## 2016-09-22 DIAGNOSIS — R262 Difficulty in walking, not elsewhere classified: Secondary | ICD-10-CM

## 2016-09-22 NOTE — Therapy (Signed)
[facility] [address] [place], [place], [address] Phone: [phone]   Fax:  [phone]  Physical Therapy Treatment  Patient Details  Name: Douglas SermonsClifford M Imhoff MRN: [id number] Date of Birth: Jul 30, 1965 Referring Provider: Arville Carejoshua dettinger, MD  Encounter Date: 09/22/2016      PT End of Session - 09/22/16 1116    Visit Number 8   Number of Visits 16   Date for PT Re-Evaluation 10/20/16   PT Start Time 1115   PT Stop Time 1202   PT Time Calculation (min) 47 min   Activity Tolerance Patient tolerated treatment well   Behavior During Therapy [facility] for tasks assessed/performed      Past Medical History:  Diagnosis Date  . Acute ST elevation myocardial infarction (STEMI) involving left circumflex coronary artery (HCC) 05/07/2016   PCI to Cx-OM  . Anginal pain (HCC)    secondary to sm. vessel disease  . Anxiety   . Arthritis    BIL KNEE PAIN AND BIL ANKLE PAIN  . Bone spur of ankle   . Cardiac arrest (HCC) 05/24/2016   with v fib  . 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 Coronary artery disease involving native coronary artery of native heart with angina pectoris (HCC) 05/24/2016   Remote MI at 51 years of age, Last cath 2010-diffuse non-obstructive disease; last echo 06/16/08 -normal LV function, moderate concentric hypertrophy; nuc 08/2008 no ischemia;  medical therapy; STEMI May 07 2016 - PCI to Cx-OM  . Diabetes mellitus    ON ORAL MEDICATION AND INSULIN  . Dilated cardiomyopathy (HCC) 05/24/2016   EF 325-30% by Echo post STEMI (previously 30-35%)   . Hyperlipidemia   . Hypertension   . Myocardial infarction (HCC) 1996   Post MI  . Peripheral vascular disease (HCC)    HAS LEFT CAROTID ARTERY STENOSIS   AND IS S/P RIGHT CAROTID ENDARTERECTOMY 2010 Last carotid dopplers 01/08/2012 wth patent endarterectomy site  . Status post coronary artery stent placement     Past  Surgical History:  Procedure Laterality Date  . APPENDECTOMY    . CARDIAC CATHETERIZATION     FEB 2010, significant branch vessel disease wth diag, marginal, PDA & PLA, nml. LV function  . CARDIAC CATHETERIZATION N/A 05/07/2016   Procedure: Left Heart Cath and Coronary Angiography;  Surgeon: Peter M SwazilandJordan, MD;  [facility];  Service: Cardiovascular;  Laterality: N/A;  . CARDIAC CATHETERIZATION N/A 05/07/2016   Procedure: Coronary Stent Intervention;  Surgeon: Peter M SwazilandJordan, MD;  [facility];  Service: Cardiovascular;  Laterality: N/A;  . CARDIAC CATHETERIZATION N/A 05/24/2016   Procedure: Left Heart Cath and Coronary Angiography;  Surgeon: Marykay Lexavid W Harding, MD;  [facility];  Service: Cardiovascular;  Laterality: N/A;  . CARDIAC CATHETERIZATION N/A 05/24/2016   Procedure: Coronary Stent Intervention;  Surgeon: Marykay Lexavid W Harding, MD;  [facility];  Service: Cardiovascular;  Laterality: N/A;  2.5x20 Promus to Ostial/proximal circumflex  . CAROTID ENDARTERECTOMY  09/2008   Rt CEA  . ICD IMPLANT N/A 06/30/2016   Procedure: ICD Implant;  Surgeon: Will Jorja LoaMartin Camnitz, MD;  [facility];  Service: Cardiovascular;  Laterality: N/A;  . LUNG BIOPSY  2013   Bx's suggest granulomatous dz.  . RT ANKLE   2013    There were no vitals filed for this visit.      Subjective Assessment -  09/22/16 1117    Subjective Patient has no new complaints and reports no falls.   Pertinent History MI, HTN, ICD implant.   Patient Stated Goals I want to walk better without hip pain and loss of balance.   Currently in Pain? No/denies                         [facility] Adult PT Treatment/Exercise - 09/22/16 0001      Knee/Hip Exercises: Aerobic   Nustep L6 x 15 min             Balance Exercises - 09/22/16 1140      Balance Exercises: Standing   Standing Eyes Opened Narrow base of support (BOS);Foam/compliant  surface;30 secs  x2    Standing Eyes Closed Narrow base of support (BOS);Solid surface;30 secs   SLS Eyes open;Upper extremity support 2;2 reps;30 secs   Stepping Strategy Other reps (comment)   Rockerboard Anterior/posterior;30 seconds  x 2; without UE support   Step Ups Forward;2 inch  x 10 B on foam   Sidestepping Foam/compliant support;Other reps (comment)  6 x 5   Heel Raises Limitations x10   Toe Raise Limitations x10   Lift / Chop Limitations 10 x B with 4# ball to encourage trunk rotation with gait   Other Standing Exercises step and toss with 4# ball; exaggerated walking with arm swing x 20 feet                PT Long Term Goals - 09/10/16 1118      PT LONG TERM GOAL #1   Title Independent with a HEP.   Time 8   Period Weeks   Status On-going     PT LONG TERM GOAL #2   Title Berg score= 53/56.   Time 8   Period Weeks   Status On-going  50/56 09/10/16     PT LONG TERM GOAL #3   Title Walk a community distance without an assisitve device.   Time 8   Period Weeks   Status On-going  using walking for community distance currently 09/10/16               Plan - 09/22/16 1206    Clinical Impression Statement Patient did fairly well with treatment today. We worked on some rotational trunk exercises as he doesn't demonstrate much arm swing with gait. He did very well with "step and toss" motion and chop/lifts demonstrating normal armswing on the R afterward and improved arm swing on the left. He bacame dizzy after exaggerated walking and required a rest.   Clinical Impairments Affecting Rehab Potential MI on 05/24/16, ICD implant, CVA per pt report with R sided weakness   PT Treatment/Interventions ADLs/Self Care Home Management;Stair training;Gait training;Therapeutic activities;Therapeutic exercise;Balance training;Neuromuscular re-education;Patient/family education   PT Next Visit Plan cont with POC for Balance and gait activities.  Please monitor BP; HR and  02 sat   PT Home Exercise Plan SLS at counter   Consulted and Agree with Plan of Care Patient      Patient will benefit from skilled therapeutic intervention in order to improve the following deficits and impairments:  Abnormal gait, Decreased balance, Difficulty walking  Visit Diagnosis: Unsteadiness on feet  Difficulty walking     Problem List Patient Active Problem List   Diagnosis Date Noted  . Ischemic cardiomyopathy 06/30/2016  . Diabetes mellitus with complication (HCC)   . Diabetes mellitus type 2 in obese (HCC)   .  Acute blood loss anemia   . Myoclonus   . Anoxic brain injury (HCC)   . Seizures (HCC)   . Dilated cardiomyopathy (HCC) 05/24/2016  . Coronary artery disease involving native coronary artery of native heart with angina pectoris (HCC) 05/24/2016  . 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 Status post coronary artery stent placement   . Essential hypertension 09/21/2013  . Pulmonary nodules 10/16/2011    Solon Palm PT 09/22/2016, 12:14 PM  [facility] [address] [place], [place], [address] Phone: [phone]   Fax:  [phone]  Name: GIDEON BURSTEIN MRN: [id number] Date of Birth: February 13, 1966

## 2016-09-24 ENCOUNTER — Encounter: Payer: Self-pay | Admitting: Physical Therapy

## 2016-09-24 ENCOUNTER — Ambulatory Visit: Payer: BC Managed Care – PPO | Admitting: Physical Therapy

## 2016-09-24 DIAGNOSIS — M6281 Muscle weakness (generalized): Secondary | ICD-10-CM

## 2016-09-24 DIAGNOSIS — R262 Difficulty in walking, not elsewhere classified: Secondary | ICD-10-CM

## 2016-09-24 DIAGNOSIS — R2681 Unsteadiness on feet: Secondary | ICD-10-CM | POA: Diagnosis not present

## 2016-09-24 NOTE — Therapy (Signed)
[facility] [address] [place], [place], [address] Phone: [phone]   Fax:  [phone]  Physical Therapy Treatment  Patient Details  Name: Douglas Carr MRN: [id number] Date of Birth: 13-Aug-1965 Referring Provider: Arville Carejoshua dettinger, MD  Encounter Date: 09/24/2016      PT End of Session - 09/24/16 1201    Visit Number 9   Number of Visits 16   Date for PT Re-Evaluation 10/20/16   PT Start Time 1115   PT Stop Time 1159   PT Time Calculation (min) 44 min   Activity Tolerance Patient tolerated treatment well   Behavior During Therapy [facility] for tasks assessed/performed      Past Medical History:  Diagnosis Date  . Acute ST elevation myocardial infarction (STEMI) involving left circumflex coronary artery (HCC) 05/07/2016   PCI to Cx-OM  . Anginal pain (HCC)    secondary to sm. vessel disease  . Anxiety   . Arthritis    BIL KNEE PAIN AND BIL ANKLE PAIN  . Bone spur of ankle   . Cardiac arrest (HCC) 05/24/2016   with v fib  . 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 Coronary artery disease involving native coronary artery of native heart with angina pectoris (HCC) 05/24/2016   Remote MI at 51 years of age, Last cath 2010-diffuse non-obstructive disease; last echo 06/16/08 -normal LV function, moderate concentric hypertrophy; nuc 08/2008 no ischemia;  medical therapy; STEMI May 07 2016 - PCI to Cx-OM  . Diabetes mellitus    ON ORAL MEDICATION AND INSULIN  . Dilated cardiomyopathy (HCC) 05/24/2016   EF 325-30% by Echo post STEMI (previously 30-35%)   . Hyperlipidemia   . Hypertension   . Myocardial infarction (HCC) 1996   Post MI  . Peripheral vascular disease (HCC)    HAS LEFT CAROTID ARTERY STENOSIS   AND IS S/P RIGHT CAROTID ENDARTERECTOMY 2010 Last carotid dopplers 01/08/2012 wth patent endarterectomy site  . Status post coronary artery stent placement     Past  Surgical History:  Procedure Laterality Date  . APPENDECTOMY    . CARDIAC CATHETERIZATION     FEB 2010, significant branch vessel disease wth diag, marginal, PDA & PLA, nml. LV function  . CARDIAC CATHETERIZATION N/A 05/07/2016   Procedure: Left Heart Cath and Coronary Angiography;  Surgeon: Peter M SwazilandJordan, MD;  [facility];  Service: Cardiovascular;  Laterality: N/A;  . CARDIAC CATHETERIZATION N/A 05/07/2016   Procedure: Coronary Stent Intervention;  Surgeon: Peter M SwazilandJordan, MD;  [facility];  Service: Cardiovascular;  Laterality: N/A;  . CARDIAC CATHETERIZATION N/A 05/24/2016   Procedure: Left Heart Cath and Coronary Angiography;  Surgeon: Marykay Lexavid W Harding, MD;  [facility];  Service: Cardiovascular;  Laterality: N/A;  . CARDIAC CATHETERIZATION N/A 05/24/2016   Procedure: Coronary Stent Intervention;  Surgeon: Marykay Lexavid W Harding, MD;  [facility];  Service: Cardiovascular;  Laterality: N/A;  2.5x20 Promus to Ostial/proximal circumflex  . CAROTID ENDARTERECTOMY  09/2008   Rt CEA  . ICD IMPLANT N/A 06/30/2016   Procedure: ICD Implant;  Surgeon: Will Jorja LoaMartin Camnitz, MD;  [facility];  Service: Cardiovascular;  Laterality: N/A;  . LUNG BIOPSY  2013   Bx's suggest granulomatous dz.  . RT ANKLE   2013    There were no vitals filed for this visit.      Subjective Assessment -  09/24/16 1126    Subjective Patient has no new complaints and reports no falls.   Pertinent History MI, HTN, ICD implant.   Patient Stated Goals I want to walk better without hip pain and loss of balance.   Currently in Pain? No/denies                         [facility] Adult PT Treatment/Exercise - 09/24/16 0001      Ambulation/Gait   Ambulation/Gait Yes   Ambulation/Gait Assistance 6: Modified independent (Device/Increase time)   Ambulation Distance (Feet) 255 Feet   Gait Pattern Decreased arm swing - right;Decreased arm  swing - left;Decreased step length - left;Decreased trunk rotation   Ambulation Surface Level   Pre-Gait Activities agility ladder for step distance x6 CGA   Gait Comments cues for arm swing, trunk roation, step length and pace, patient able to self correct with cues     Knee/Hip Exercises: Aerobic   Tread Mill @1 .0 CGA cues to avoid left foot drag   Nustep L6 x 15 min UE/LE activity             Balance Exercises - 09/24/16 1129      Balance Exercises: Standing   Standing, One Foot on a Step Eyes open;8 inch;4 reps   Rockerboard Anterior/posterior    Heel Raises Limitations x10   Toe Raise Limitations x10   Lift / Chop Limitations 10 x B with 4# ball to encourage trunk rotation with gait                PT Long Term Goals - 09/10/16 1118      PT LONG TERM GOAL #1   Title Independent with a HEP.   Time 8   Period Weeks   Status On-going     PT LONG TERM GOAL #2   Title Berg score= 53/56.   Time 8   Period Weeks   Status On-going  50/56 09/10/16     PT LONG TERM GOAL #3   Title Walk a community distance without an assisitve device.   Time 8   Period Weeks   Status On-going  using walking for community distance currently 09/10/16               Plan - 09/24/16 1202    Clinical Impression Statement Patient tolerated treatment well today. Patient improved with gait training acivities today with cues for correct technique. Patient able to correct step length on tredmill today with cues and increased swing time on bil sides. Patient has some decreased trunck rotation dificulty. Patient progressing and reported walking 150 feetx2 at home at most with no assitive device. Pain in left hip with prolong walking. Goals ongoing. Vitals WNL.   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  Please monitor BP; HR and 02 sat   Consulted and Agree with Plan of Care Patient      Patient will benefit from skilled therapeutic intervention in order to improve the following deficits and impairments:  Abnormal gait, Decreased balance, Difficulty walking  Visit Diagnosis: Unsteadiness on feet  Difficulty walking  Muscle  weakness (generalized)     Problem List Patient Active Problem List   Diagnosis Date Noted  . Ischemic cardiomyopathy 06/30/2016  . Diabetes mellitus with complication (HCC)   . Diabetes mellitus type 2 in obese (HCC)   . Acute blood loss anemia   . Myoclonus   . Anoxic brain injury (HCC)   . Seizures (HCC)   . Dilated cardiomyopathy (HCC) 05/24/2016  . Coronary artery disease involving native coronary artery of native heart with angina pectoris (HCC) 05/24/2016  . 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 Status post coronary artery stent placement   . Essential hypertension 09/21/2013  . Pulmonary nodules 10/16/2011    Hermelinda Dellen, PTA 09/24/2016, 12:08 PM  [facility] [address] [place], [place], [address] Phone: [phone]   Fax:  [phone]  Name: Douglas Carr MRN: [id number] Date of Birth: 07-16-1965

## 2016-09-30 ENCOUNTER — Ambulatory Visit: Payer: BC Managed Care – PPO | Admitting: Physical Therapy

## 2016-09-30 ENCOUNTER — Encounter: Payer: Self-pay | Admitting: Physical Therapy

## 2016-09-30 ENCOUNTER — Other Ambulatory Visit: Payer: Self-pay | Admitting: Family Medicine

## 2016-09-30 DIAGNOSIS — R262 Difficulty in walking, not elsewhere classified: Secondary | ICD-10-CM

## 2016-09-30 DIAGNOSIS — R2681 Unsteadiness on feet: Secondary | ICD-10-CM | POA: Diagnosis not present

## 2016-09-30 DIAGNOSIS — M6281 Muscle weakness (generalized): Secondary | ICD-10-CM

## 2016-09-30 NOTE — Therapy (Signed)
[facility] [place], [place], [address] Phone: [phone]   Fax:  [phone]  Physical Therapy Treatment  Patient Details  Name: Douglas Carr MRN: [id number] Date of Birth: [birth year] Referring Provider: Caryl Pina, MD  Encounter Date: 09/30/2016      PT End of Session - 09/30/16 1119    Visit Number 10   Number of Visits 16   Date for PT Re-Evaluation 10/20/16   PT Start Time 1117   PT Stop Time 1158   PT Time Calculation (min) 41 min   Activity Tolerance Patient tolerated treatment well   Behavior During Therapy [facility] for tasks assessed/performed      Past Medical History:  Diagnosis Date  . Acute ST elevation myocardial infarction (STEMI) involving left circumflex coronary artery ([place]) 05/07/2016   PCI to Cx-OM  . Anginal pain ([place])    secondary to sm. vessel disease  . Anxiety   . Arthritis    BIL KNEE PAIN AND BIL ANKLE PAIN  . Bone spur of ankle   . Cardiac arrest ([place]) 05/24/2016   with v fib  . Chronic combined systolic and diastolic CHF, NYHA class 2 and ACA/AHA stage C ([place]) 05/13/2016  . Coronary artery disease involving native coronary artery of native heart with angina pectoris ([place]) 05/24/2016   Remote MI at 51 years of age, Last cath 2010-diffuse non-obstructive disease; last echo 06/16/08 -normal LV function, moderate concentric hypertrophy; nuc 08/2008 no ischemia;  medical therapy; STEMI May 07 2016 - PCI to Cx-OM  . Diabetes mellitus    ON ORAL MEDICATION AND INSULIN  . Dilated cardiomyopathy ([place]) 05/24/2016   EF 325-30% by Echo post STEMI (previously 30-35%)   . Hyperlipidemia   . Hypertension   . Myocardial infarction ([place]) 1996   Post MI  . Peripheral vascular disease ([place])    HAS LEFT CAROTID ARTERY STENOSIS   AND IS S/P RIGHT CAROTID ENDARTERECTOMY 2010 Last carotid dopplers 01/08/2012 wth patent endarterectomy site  . Status post coronary artery stent placement     Past  Surgical History:  Procedure Laterality Date  . APPENDECTOMY    . CARDIAC CATHETERIZATION     FEB 2010, significant branch vessel disease wth diag, marginal, PDA & PLA, nml. LV function  . CARDIAC CATHETERIZATION N/A 05/07/2016   Procedure: Left Heart Cath and Coronary Angiography;  Surgeon: Peter M Martinique, MD;  Location: [place] CV LAB;  Service: Cardiovascular;  Laterality: N/A;  . CARDIAC CATHETERIZATION N/A 05/07/2016   Procedure: Coronary Stent Intervention;  Surgeon: Peter M Martinique, MD;  Location: [place] CV LAB;  Service: Cardiovascular;  Laterality: N/A;  . CARDIAC CATHETERIZATION N/A 05/24/2016   Procedure: Left Heart Cath and Coronary Angiography;  Surgeon: [name], MD;  Location: [place] CV LAB;  Service: Cardiovascular;  Laterality: N/A;  . CARDIAC CATHETERIZATION N/A 05/24/2016   Procedure: Coronary Stent Intervention;  Surgeon: [name], MD;  Location: [place] CV LAB;  Service: Cardiovascular;  Laterality: N/A;  2.5x20 Promus to Ostial/proximal circumflex  . CAROTID ENDARTERECTOMY  09/2008   Rt CEA  . ICD IMPLANT N/A 06/30/2016   Procedure: ICD Implant;  Surgeon: Will Meredith Leeds, MD;  Location: [place] CV LAB;  Service: Cardiovascular;  Laterality: N/A;  . LUNG BIOPSY  2013   Bx's suggest granulomatous dz.  . RT ANKLE   2013    There were no vitals filed for this visit.      Subjective Assessment -  09/30/16 1119    Subjective No new complaints given by patient upon arrival and reports that he is "alright" today.   Pertinent History MI, HTN, ICD implant.   Patient Stated Goals I want to walk better without hip pain and loss of balance.   Currently in Pain? No/denies            [facility] PT Assessment - 09/30/16 0001      Assessment   Medical Diagnosis balance problems   Hand Dominance Right     Precautions   Precautions Fall   Precaution Comments no falls since last visit     Restrictions   Weight Bearing Restrictions No                      [facility] Adult PT Treatment/Exercise - 09/30/16 0001      Standardized Balance Assessment   Standardized Balance Assessment Berg Balance Test     Berg Balance Test   Sit to Stand Able to stand without using hands and stabilize independently   Standing Unsupported Able to stand safely 2 minutes   Sitting with Back Unsupported but Feet Supported on Floor or Stool Able to sit safely and securely 2 minutes   Stand to Sit Sits safely with minimal use of hands   Transfers Able to transfer safely, minor use of hands   Standing Unsupported with Eyes Closed Able to stand 10 seconds safely   Standing Ubsupported with Feet Together Able to place feet together independently and stand 1 minute safely   From Standing, Reach Forward with Outstretched Arm Can reach confidently >25 cm (10")   From Standing Position, Pick up Object from Floor Able to pick up shoe safely and easily   From Standing Position, Turn to Look Behind Over each Shoulder Looks behind from both sides and weight shifts well   Turn 360 Degrees Able to turn 360 degrees safely in 4 seconds or less   Standing Unsupported, Alternately Place Feet on Step/Stool Able to stand independently and complete 8 steps >20 seconds   Standing Unsupported, One Foot in Front Able to place foot tandem independently and hold 30 seconds   Standing on One Leg Able to lift leg independently and hold > 10 seconds   Total Score 55     Knee/Hip Exercises: Aerobic   Tread Mill 4 min at 1.0 mph   Nustep L5 x 15 min UE/LE activity     Knee/Hip Exercises: Standing   Other Standing Knee Exercises BUE D1/D2 with core activation x20 reps to improve trunk rotation in gait     Knee/Hip Exercises: Seated   Other Seated Knee/Hip Exercises L ankle DF 1# ankle isolator x30 reps                     PT Long Term Goals - 09/30/16 1128      PT LONG TERM GOAL #1   Title Independent with a HEP.   Time 8   Period Weeks   Status  Achieved     PT LONG TERM GOAL #2   Title Berg score= 53/56.   Time 8   Period Weeks   Status Achieved  55/56 as of 09/30/2016     PT LONG TERM GOAL #3   Title Walk a community distance without an assisitve device.   Time 8   Period Weeks   Status Partially Met  No AD used only holding to shopping cart 09/30/2016  Plan - 09/30/16 1205    Clinical Impression Statement Patient tolerated today's treatment fairly well as he states that he continues to have difficulty with any kind of walking activity as he fatigues. During ambulation on treadmill patient was able to ambulate without UE support for only 1.5 min with short stride and step length for LLE. Step and stride length normalized as well with UE support on treadmill as did L foot clearance. BERG score improved to 55/56 today with greatest dificulty noted with SLS or narrow BOS. Seated rest break required following treadmill session due to fatigue. Patient encouraged to continue standing balance HEP at countertop at home. Patient able to achieve all goals except for AD use during gait goal as he continues to have to use AD/cart for community distance.   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  Please monitor BP; HR and 02 sat   PT Home Exercise Plan SLS at counter   Consulted and Agree with Plan of Care Patient      Patient will benefit from skilled therapeutic intervention in order to improve the following deficits and impairments:  Abnormal gait, Decreased balance, Difficulty walking  Visit  Diagnosis: Unsteadiness on feet  Difficulty walking  Muscle weakness (generalized)     Problem List Patient Active Problem List   Diagnosis Date Noted  . Ischemic cardiomyopathy 06/30/2016  . Diabetes mellitus with complication ([place])   . Diabetes mellitus type 2 in obese ([place])   . Acute blood loss anemia   . Myoclonus   . Anoxic brain injury ([place])   . Seizures ([place])   . Dilated cardiomyopathy ([place]) 05/24/2016  . Coronary artery disease involving native coronary artery of native heart with angina pectoris ([place]) 05/24/2016  . Chronic combined systolic and diastolic CHF, NYHA class 2 and ACA/AHA stage C ([place]) 05/13/2016  . Status post coronary artery stent placement   . Essential hypertension 09/21/2013  . Pulmonary nodules 10/16/2011    Wynelle Fanny, PTA 09/30/2016, 12:13 PM  [facility] [address] [place], [place], [address] Phone: [phone]   Fax:  [phone]  Name: JODEY BURBANO MRN: [id number] Date of Birth: Aug 20, 1965

## 2016-10-02 ENCOUNTER — Encounter: Payer: Self-pay | Admitting: Physical Therapy

## 2016-10-02 ENCOUNTER — Ambulatory Visit (INDEPENDENT_AMBULATORY_CARE_PROVIDER_SITE_OTHER): Payer: BC Managed Care – PPO | Admitting: Family Medicine

## 2016-10-02 ENCOUNTER — Ambulatory Visit: Payer: BC Managed Care – PPO | Admitting: Physical Therapy

## 2016-10-02 DIAGNOSIS — R2681 Unsteadiness on feet: Secondary | ICD-10-CM

## 2016-10-02 DIAGNOSIS — Z48812 Encounter for surgical aftercare following surgery on the circulatory system: Secondary | ICD-10-CM

## 2016-10-02 DIAGNOSIS — E669 Obesity, unspecified: Secondary | ICD-10-CM

## 2016-10-02 DIAGNOSIS — M6281 Muscle weakness (generalized): Secondary | ICD-10-CM

## 2016-10-02 DIAGNOSIS — Z6831 Body mass index (BMI) 31.0-31.9, adult: Secondary | ICD-10-CM | POA: Diagnosis not present

## 2016-10-02 DIAGNOSIS — E1151 Type 2 diabetes mellitus with diabetic peripheral angiopathy without gangrene: Secondary | ICD-10-CM

## 2016-10-02 DIAGNOSIS — I5043 Acute on chronic combined systolic (congestive) and diastolic (congestive) heart failure: Secondary | ICD-10-CM | POA: Diagnosis not present

## 2016-10-02 DIAGNOSIS — G4089 Other seizures: Secondary | ICD-10-CM

## 2016-10-02 DIAGNOSIS — Z950 Presence of cardiac pacemaker: Secondary | ICD-10-CM

## 2016-10-02 DIAGNOSIS — I25119 Atherosclerotic heart disease of native coronary artery with unspecified angina pectoris: Secondary | ICD-10-CM | POA: Diagnosis not present

## 2016-10-02 DIAGNOSIS — R262 Difficulty in walking, not elsewhere classified: Secondary | ICD-10-CM

## 2016-10-02 DIAGNOSIS — E784 Other hyperlipidemia: Secondary | ICD-10-CM

## 2016-10-02 DIAGNOSIS — I110 Hypertensive heart disease with heart failure: Secondary | ICD-10-CM

## 2016-10-02 DIAGNOSIS — I420 Dilated cardiomyopathy: Secondary | ICD-10-CM

## 2016-10-02 NOTE — Therapy (Signed)
[facility] [place], [place], [address] Phone: [phone]   Fax:  [phone]  Physical Therapy Treatment  Patient Details  Name: Douglas Carr MRN: [id number] Date of Birth: Jul 05, 1965 Referring Provider: Caryl Pina, MD  Encounter Date: 10/02/2016      PT End of Session - 10/02/16 1157    Visit Number 11   Number of Visits 16   Date for PT Re-Evaluation 10/20/16   PT Start Time 1115   PT Stop Time 1156   PT Time Calculation (min) 41 min   Activity Tolerance Patient tolerated treatment well   Behavior During Therapy [facility] for tasks assessed/performed      Past Medical History:  Diagnosis Date  . Acute ST elevation myocardial infarction (STEMI) involving left circumflex coronary artery ([place]) 05/07/2016   PCI to Cx-OM  . Anginal pain ([place])    secondary to sm. vessel disease  . Anxiety   . Arthritis    BIL KNEE PAIN AND BIL ANKLE PAIN  . Bone spur of ankle   . Cardiac arrest ([place]) 05/24/2016   with v fib  . Chronic combined systolic and diastolic CHF, NYHA class 2 and ACA/AHA stage C ([place]) 05/13/2016  . Coronary artery disease involving native coronary artery of native heart with angina pectoris ([place]) 05/24/2016   Remote MI at 51 years of age, Last cath 2010-diffuse non-obstructive disease; last echo 06/16/08 -normal LV function, moderate concentric hypertrophy; nuc 08/2008 no ischemia;  medical therapy; STEMI May 07 2016 - PCI to Cx-OM  . Diabetes mellitus    ON ORAL MEDICATION AND INSULIN  . Dilated cardiomyopathy ([place]) 05/24/2016   EF 325-30% by Echo post STEMI (previously 30-35%)   . Hyperlipidemia   . Hypertension   . Myocardial infarction ([place]) 1996   Post MI  . Peripheral vascular disease ([place])    HAS LEFT CAROTID ARTERY STENOSIS   AND IS S/P RIGHT CAROTID ENDARTERECTOMY 2010 Last carotid dopplers 01/08/2012 wth patent endarterectomy site  . Status post coronary artery stent placement     Past  Surgical History:  Procedure Laterality Date  . APPENDECTOMY    . CARDIAC CATHETERIZATION     FEB 2010, significant branch vessel disease wth diag, marginal, PDA & PLA, nml. LV function  . CARDIAC CATHETERIZATION N/A 05/07/2016   Procedure: Left Heart Cath and Coronary Angiography;  Surgeon: Peter M Martinique, MD;  Location: [place] [facility];  Service: Cardiovascular;  Laterality: N/A;  . CARDIAC CATHETERIZATION N/A 05/07/2016   Procedure: Coronary Stent Intervention;  Surgeon: Peter M Martinique, MD;  Location: [place] [facility];  Service: Cardiovascular;  Laterality: N/A;  . CARDIAC CATHETERIZATION N/A 05/24/2016   Procedure: Left Heart Cath and Coronary Angiography;  Surgeon: [name], MD;  Location: [place] [facility];  Service: Cardiovascular;  Laterality: N/A;  . CARDIAC CATHETERIZATION N/A 05/24/2016   Procedure: Coronary Stent Intervention;  Surgeon: [name], MD;  Location: [place] [facility];  Service: Cardiovascular;  Laterality: N/A;  2.5x20 Promus to Ostial/proximal circumflex  . CAROTID ENDARTERECTOMY  09/2008   Rt CEA  . ICD IMPLANT N/A 06/30/2016   Procedure: ICD Implant;  Surgeon: Will Meredith Leeds, MD;  Location: [place] [facility];  Service: Cardiovascular;  Laterality: N/A;  . LUNG BIOPSY  2013   Bx's suggest granulomatous dz.  . RT ANKLE   2013    There were no vitals filed for this visit.      Subjective Assessment -  10/02/16 1123    Subjective No new complaints given by patient upon arrival    Pertinent History MI, HTN, ICD implant.   Patient Stated Goals I want to walk better without hip pain and loss of balance.   Currently in Pain? No/denies                         [facility] Adult PT Treatment/Exercise - 10/02/16 0001      Ambulation/Gait   Pre-Gait Activities 2x10 4# D1/D2 for truck rotation then agility ladder for step over x4, side step x2     Knee/Hip Exercises: Aerobic   Tread Mill 6 min at 1.0 mph   Nustep L5 x 16 min UE/LE  activity     Knee/Hip Exercises: Seated   Other Seated Knee/Hip Exercises L ankle DF 1 1/2# ankle isolator x30 reps                     PT Long Term Goals - 09/30/16 1128      PT LONG TERM GOAL #1   Title Independent with a HEP.   Time 8   Period Weeks   Status Achieved     PT LONG TERM GOAL #2   Title Berg score= 53/56.   Time 8   Period Weeks   Status Achieved  55/56 as of 09/30/2016     PT LONG TERM GOAL #3   Title Walk a community distance without an assisitve device.   Time 8   Period Weeks   Status Partially Met  No AD used only holding to shopping cart 09/30/2016               Plan - 10/02/16 1158    Clinical Impression Statement Patient tolerated treatment well today. Patient able to progress with pregait activities and minimal LOB with step overs. Patient has reported no falls or LOB. Patient only has ongoing difficulty with long distance. Patient remaing goal ongoing.   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   PT Frequency 2x / week   PT Duration 8 weeks   PT Treatment/Interventions ADLs/Self Care Home Management;Stair training;Gait training;Therapeutic activities;Therapeutic exercise;Balance training;Neuromuscular re-education;Patient/family education   PT Next Visit Plan cont with POC for Balance and gait activities may add resisted walking with pink XTS   Consulted and Agree with Plan of Care Patient      Patient will benefit from skilled therapeutic intervention in order to improve the following deficits and impairments:  Abnormal gait, Decreased balance, Difficulty walking  Visit Diagnosis: Unsteadiness on feet  Difficulty walking  Muscle weakness (generalized)     Problem List Patient Active Problem List   Diagnosis Date Noted  . Ischemic cardiomyopathy 06/30/2016  . Diabetes mellitus with complication ([place])   . Diabetes  mellitus type 2 in obese ([place])   . Acute blood loss anemia   . Myoclonus   . Anoxic brain injury ([place])   . Seizures ([place])   . Dilated cardiomyopathy ([place]) 05/24/2016  . Coronary artery disease involving native coronary artery of native heart with angina pectoris ([place]) 05/24/2016  . Chronic combined systolic and diastolic CHF, NYHA class 2 and ACA/AHA stage C ([place]) 05/13/2016  . Status post coronary artery stent placement   . Essential hypertension 09/21/2013  . Pulmonary nodules 10/16/2011    Chae Oommen P, PTA 10/02/2016, 12:03 PM  [facility] [place], [place], [address] Phone: [phone]   Fax:  [phone]  Name: Douglas Carr MRN: [id number] Date of Birth: [birth year]

## 2016-10-03 ENCOUNTER — Other Ambulatory Visit: Payer: Self-pay | Admitting: Family Medicine

## 2016-10-06 ENCOUNTER — Ambulatory Visit: Payer: BC Managed Care – PPO | Attending: Family Medicine | Admitting: Physical Therapy

## 2016-10-06 ENCOUNTER — Encounter: Payer: Self-pay | Admitting: Physical Therapy

## 2016-10-06 DIAGNOSIS — R262 Difficulty in walking, not elsewhere classified: Secondary | ICD-10-CM | POA: Diagnosis present

## 2016-10-06 DIAGNOSIS — M6281 Muscle weakness (generalized): Secondary | ICD-10-CM | POA: Diagnosis present

## 2016-10-06 DIAGNOSIS — R2681 Unsteadiness on feet: Secondary | ICD-10-CM

## 2016-10-06 NOTE — Therapy (Signed)
[facility] [place], [place], [address] Phone: [phone]   Fax:  [phone]  Physical Therapy Treatment  Patient Details  Name: Douglas Carr MRN: [id number] Date of Birth: 08/19/1965 Referring Provider: Caryl Pina, MD  Encounter Date: 10/06/2016      PT End of Session - 10/06/16 1158    Visit Number 12   Number of Visits 16   Date for PT Re-Evaluation 10/20/16   PT Start Time 1119   PT Stop Time 1158   PT Time Calculation (min) 39 min   Activity Tolerance Patient tolerated treatment well   Behavior During Therapy [facility] for tasks assessed/performed      Past Medical History:  Diagnosis Date  . Acute ST elevation myocardial infarction (STEMI) involving left circumflex coronary artery ([place]) 05/07/2016   PCI to Cx-OM  . Anginal pain ([place])    secondary to sm. vessel disease  . Anxiety   . Arthritis    BIL KNEE PAIN AND BIL ANKLE PAIN  . Bone spur of ankle   . Cardiac arrest ([place]) 05/24/2016   with v fib  . 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 Coronary artery disease involving native coronary artery of native heart with angina pectoris ([place]) 05/24/2016   Remote MI at 51 years of age, Last cath 2010-diffuse non-obstructive disease; last echo 06/16/08 -normal LV function, moderate concentric hypertrophy; nuc 08/2008 no ischemia;  medical therapy; STEMI May 07 2016 - PCI to Cx-OM  . Diabetes mellitus    ON ORAL MEDICATION AND INSULIN  . Dilated cardiomyopathy ([place]) 05/24/2016   EF 325-30% by Echo post STEMI (previously 30-35%)   . Hyperlipidemia   . Hypertension   . Myocardial infarction ([place]) 1996   Post MI  . Peripheral vascular disease ([place])    HAS LEFT CAROTID ARTERY STENOSIS   AND IS S/P RIGHT CAROTID ENDARTERECTOMY 2010 Last carotid dopplers 01/08/2012 wth patent endarterectomy site  . Status post coronary artery stent placement     Past  Surgical History:  Procedure Laterality Date  . APPENDECTOMY    . CARDIAC CATHETERIZATION     FEB 2010, significant branch vessel disease wth diag, marginal, PDA & PLA, nml. LV function  . CARDIAC CATHETERIZATION N/A 05/07/2016   Procedure: Left Heart Cath and Coronary Angiography;  Surgeon: Peter M Martinique, MD;  Location: [place] [facility];  Service: Cardiovascular;  Laterality: N/A;  . CARDIAC CATHETERIZATION N/A 05/07/2016   Procedure: Coronary Stent Intervention;  Surgeon: Peter M Martinique, MD;  Location: [place] [facility];  Service: Cardiovascular;  Laterality: N/A;  . CARDIAC CATHETERIZATION N/A 05/24/2016   Procedure: Left Heart Cath and Coronary Angiography;  Surgeon: [name], MD;  Location: [place] [facility];  Service: Cardiovascular;  Laterality: N/A;  . CARDIAC CATHETERIZATION N/A 05/24/2016   Procedure: Coronary Stent Intervention;  Surgeon: [name], MD;  Location: [place] [facility];  Service: Cardiovascular;  Laterality: N/A;  2.5x20 Promus to Ostial/proximal circumflex  . CAROTID ENDARTERECTOMY  09/2008   Rt CEA  . ICD IMPLANT N/A 06/30/2016   Procedure: ICD Implant;  Surgeon: Will Meredith Leeds, MD;  Location: [place] [facility];  Service: Cardiovascular;  Laterality: N/A;  . LUNG BIOPSY  2013   Bx's suggest granulomatous dz.  . RT ANKLE   2013    There were no vitals filed for this visit.      Subjective Assessment -  10/06/16 1118    Subjective No new complaints given by patient upon arrival    Pertinent History MI, HTN, ICD implant.   Patient Stated Goals I want to walk better without hip pain and loss of balance.   Currently in Pain? No/denies                         [facility] Adult PT Treatment/Exercise - 10/06/16 0001      Ambulation/Gait   Ambulation/Gait Yes   Ambulation/Gait Assistance 7: Independent   Ambulation Distance (Feet) 325 Feet   Gait Pattern Decreased arm swing - right;Decreased arm swing - left;Decreased trunk rotation    Ambulation Surface Level;Indoor   Pre-Gait Activities pink XTS for resisted walking forward backward, side-side, patient had difficulty with backward and some pain in hips side-side SBA/CGA   Gait Comments cues to increase trunk rotation and arm swing     Knee/Hip Exercises: Aerobic   Nustep L5 x 16 min UE/LE activity     Knee/Hip Exercises: Standing   Other Standing Knee Exercises BUE D1/D2 with core activation x20 reps to improve trunk rotation in gait   Other Standing Knee Exercises hip hike 2x5 each LE, difficulty on RIght LE     Knee/Hip Exercises: Seated   Other Seated Knee/Hip Exercises L ankle DF 1 1/2# ankle isolator x30 reps     Knee/Hip Exercises: Supine   Other Supine Knee/Hip Exercises bridges with glut squeeze x20                     PT Long Term Goals - 09/30/16 1128      PT LONG TERM GOAL #1   Title Independent with a HEP.   Time 8   Period Weeks   Status Achieved     PT LONG TERM GOAL #2   Title Berg score= 53/56.   Time 8   Period Weeks   Status Achieved  55/56 as of 09/30/2016     PT LONG TERM GOAL #3   Title Walk a community distance without an assisitve device.   Time 8   Period Weeks   Status Partially Met  No AD used only holding to shopping cart 09/30/2016               Plan - 10/06/16 1159    Clinical Impression Statement Patient tolerated treatment well today. Patient continues to have difficulty with gait training with decreased arm swing and trunk rotation. Patient has ongoing soreness/pain in hips with prolong standing or walking. Patient has difficulty with SLS and did fair with hip hike exercise with increased difficulty on right. Patient progressing toward goal.   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   PT Frequency 2x / week   PT Duration 8 weeks   PT Treatment/Interventions ADLs/Self Care Home Management;Stair  training;Gait training;Therapeutic activities;Therapeutic exercise;Balance training;Neuromuscular re-education;Patient/family education   PT Next Visit Plan cont with POC for Balance and gait activities   Consulted and Agree with Plan of Care Patient      Patient will benefit from skilled therapeutic intervention in order to improve the following deficits and impairments:  Abnormal gait, Decreased balance, Difficulty walking  Visit Diagnosis: Unsteadiness on feet  Difficulty walking  Muscle weakness (generalized)     Problem List Patient Active Problem List   Diagnosis Date Noted  . Ischemic cardiomyopathy 06/30/2016  .  Diabetes mellitus with complication ([place])   . Diabetes mellitus type 2 in obese ([place])   . Acute blood loss anemia   . Myoclonus   . Anoxic brain injury ([place])   . Seizures ([place])   . Dilated cardiomyopathy ([place]) 05/24/2016  . Coronary artery disease involving native coronary artery of native heart with angina pectoris ([place]) 05/24/2016  . 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 Status post coronary artery stent placement   . Essential hypertension 09/21/2013  . Pulmonary nodules 10/16/2011    Phillips Climes, PTA 10/06/2016, 12:03 PM  [facility] [address] [place], [place], [address] Phone: [phone]   Fax:  [phone]  Name: DARION MILEWSKI MRN: [id number] Date of Birth: 1965/09/06

## 2016-10-07 ENCOUNTER — Ambulatory Visit: Payer: BC Managed Care – PPO | Admitting: Physical Therapy

## 2016-10-07 ENCOUNTER — Ambulatory Visit (INDEPENDENT_AMBULATORY_CARE_PROVIDER_SITE_OTHER): Payer: BC Managed Care – PPO | Admitting: Cardiology

## 2016-10-07 ENCOUNTER — Encounter: Payer: Self-pay | Admitting: Cardiology

## 2016-10-07 VITALS — BP 114/82 | HR 90 | Ht 69.0 in | Wt 230.4 lb

## 2016-10-07 DIAGNOSIS — I255 Ischemic cardiomyopathy: Secondary | ICD-10-CM | POA: Diagnosis not present

## 2016-10-07 DIAGNOSIS — R2681 Unsteadiness on feet: Secondary | ICD-10-CM | POA: Diagnosis not present

## 2016-10-07 DIAGNOSIS — R262 Difficulty in walking, not elsewhere classified: Secondary | ICD-10-CM

## 2016-10-07 DIAGNOSIS — I4901 Ventricular fibrillation: Secondary | ICD-10-CM

## 2016-10-07 DIAGNOSIS — I469 Cardiac arrest, cause unspecified: Secondary | ICD-10-CM

## 2016-10-07 LAB — CUP PACEART INCLINIC DEVICE CHECK
Battery Remaining Longevity: 136 mo
Date Time Interrogation Session: 20180605181043
HIGH POWER IMPEDANCE MEASURED VALUE: 69 Ohm
Implantable Lead Location: 753860
Lead Channel Impedance Value: 304 Ohm
Lead Channel Impedance Value: 399 Ohm
Lead Channel Pacing Threshold Amplitude: 0.75 V
MDC IDC LEAD IMPLANT DT: 20180226
MDC IDC MSMT BATTERY VOLTAGE: 3.16 V
MDC IDC MSMT LEADCHNL RV PACING THRESHOLD PULSEWIDTH: 0.4 ms
MDC IDC MSMT LEADCHNL RV SENSING INTR AMPL: 19.5 mV
MDC IDC PG IMPLANT DT: 20180226
MDC IDC SET LEADCHNL RV PACING AMPLITUDE: 2.5 V
MDC IDC SET LEADCHNL RV PACING PULSEWIDTH: 0.4 ms
MDC IDC SET LEADCHNL RV SENSING SENSITIVITY: 0.3 mV
MDC IDC STAT BRADY RV PERCENT PACED: 0 %

## 2016-10-07 MED ORDER — CARVEDILOL 12.5 MG PO TABS: 12.5000 mg | ORAL_TABLET | Freq: Two times a day (BID) | ORAL | 3 refills | Status: DC

## 2016-10-07 NOTE — Therapy (Addendum)
[facility] [place], [place], [address] Phone: [phone]   Fax:  [phone]  Physical Therapy Treatment  Patient Details  Name: Douglas Carr MRN: [id number] Date of Birth: December 08, 1965 Referring Provider: Caryl Pina, MD  Encounter Date: 10/07/2016      PT End of Session - 10/07/16 1124    Visit Number 13   Number of Visits 16   Date for PT Re-Evaluation 10/20/16   PT Start Time 1118   PT Stop Time 1203   PT Time Calculation (min) 45 min   Activity Tolerance Patient tolerated treatment well   Behavior During Therapy [facility] for tasks assessed/performed      Past Medical History:  Diagnosis Date  . Acute ST elevation myocardial infarction (STEMI) involving left circumflex coronary artery ([place]) 05/07/2016   PCI to Cx-OM  . Anginal pain ([place])    secondary to sm. vessel disease  . Anxiety   . Arthritis    BIL KNEE PAIN AND BIL ANKLE PAIN  . Bone spur of ankle   . Cardiac arrest ([place]) 05/24/2016   with v fib  . Chronic combined systolic and diastolic CHF, NYHA class 2 and ACA/AHA stage C ([place]) 05/13/2016  . Coronary artery disease involving native coronary artery of native heart with angina pectoris ([place]) 05/24/2016   Remote MI at 50 years of age, Last cath 2010-diffuse non-obstructive disease; last echo 06/16/08 -normal LV function, moderate concentric hypertrophy; nuc 08/2008 no ischemia;  medical therapy; STEMI May 07 2016 - PCI to Cx-OM  . Diabetes mellitus    ON ORAL MEDICATION AND INSULIN  . Dilated cardiomyopathy ([place]) 05/24/2016   EF 325-30% by Echo post STEMI (previously 30-35%)   . Hyperlipidemia   . Hypertension   . Myocardial infarction ([place]) 1996   Post MI  . Peripheral vascular disease ([place])    HAS LEFT CAROTID ARTERY STENOSIS   AND IS S/P RIGHT CAROTID ENDARTERECTOMY 2010 Last carotid dopplers 01/08/2012 wth patent endarterectomy site  . Status post coronary artery stent placement     Past  Surgical History:  Procedure Laterality Date  . APPENDECTOMY    . CARDIAC CATHETERIZATION     FEB 2010, significant branch vessel disease wth diag, marginal, PDA & PLA, nml. LV function  . CARDIAC CATHETERIZATION N/A 05/07/2016   Procedure: Left Heart Cath and Coronary Angiography;  Surgeon: Peter M Martinique, MD;  Location: [place] [facility];  Service: Cardiovascular;  Laterality: N/A;  . CARDIAC CATHETERIZATION N/A 05/07/2016   Procedure: Coronary Stent Intervention;  Surgeon: Peter M Martinique, MD;  Location: [place] [facility];  Service: Cardiovascular;  Laterality: N/A;  . CARDIAC CATHETERIZATION N/A 05/24/2016   Procedure: Left Heart Cath and Coronary Angiography;  Surgeon: [name], MD;  Location: [place] [facility];  Service: Cardiovascular;  Laterality: N/A;  . CARDIAC CATHETERIZATION N/A 05/24/2016   Procedure: Coronary Stent Intervention;  Surgeon: [name], MD;  Location: [place] [facility];  Service: Cardiovascular;  Laterality: N/A;  2.5x20 Promus to Ostial/proximal circumflex  . CAROTID ENDARTERECTOMY  09/2008   Rt CEA  . ICD IMPLANT N/A 06/30/2016   Procedure: ICD Implant;  Surgeon: Will Meredith Leeds, MD;  Location: [place] [facility];  Service: Cardiovascular;  Laterality: N/A;  . LUNG BIOPSY  2013   Bx's suggest granulomatous dz.  . RT ANKLE   2013    There were no vitals filed for this visit.      Subjective Assessment -  10/07/16 1128    Subjective Patient states he is able to walk about 5 min before his hips start to hurt bilaterally.   Pertinent History MI, HTN, ICD implant.   Patient Stated Goals I want to walk better without hip pain and loss of balance.   Currently in Pain? No/denies                         [facility] Adult PT Treatment/Exercise - 10/07/16 0001      Ambulation/Gait   Ambulation/Gait Yes   Ambulation/Gait Assistance 7: Independent   Ambulation Distance (Feet) 606 Feet   Gait Pattern Decreased arm swing - right;Decreased  trunk rotation   Ambulation Surface Level;Unlevel;Outdoor   Stairs Yes   Stairs Assistance 7: Independent   Stair Management Technique One rail Right;One rail Left;Alternating pattern;Step to pattern  step to on ascent; reciprocal down   Number of Stairs 4   Height of Stairs 6   Gait Comments patient reports increased hip pain at about 5 min into amb/approx 560 feet     Knee/Hip Exercises: Stretches   Active Hamstring Stretch Both;1 rep;60 seconds   Hip Flexor Stretch Both;1 rep;60 seconds  thomas test position   Piriformis Stretch Both;1 rep;30 seconds   Piriformis Stretch Limitations fig 4 with PT assist   Other Knee/Hip Stretches supine PF stretch  1x30 sec each then SDLY with leg hanging in front off EOB 2x1 min Bil     Knee/Hip Exercises: Aerobic   Nustep L5 x 16 min UE/LE activity                PT Education - 10/07/16 1214    Education provided Yes   Education Details HEP   Person(s) Educated Patient   Methods Explanation;Demonstration;Tactile cues;Verbal cues;Handout   Comprehension Verbalized understanding;Returned demonstration             PT Long Term Goals - 10/07/16 1128      PT LONG TERM GOAL #1   Title Independent with a HEP.   Period Weeks   Status Achieved     PT LONG TERM GOAL #2   Title Berg score= 53/56.   Time 8   Status Achieved     PT LONG TERM GOAL #3   Title Walk a community distance without an assisitve device.   Baseline Patient has to stop around 600 feet/5 min due to hip pain   Time 8   Period Weeks   Status Partially Met               Plan - 10/07/16 1216    Clinical Impression Statement Patient did well with treatment today. He is limited with ambulation by hip pain B and has significant tightness in B piriformis, HS, hip rotators and HS. He was issued HEP to address these and will benefit from continued stretching in clinic. He was able to amb without Assistance but PT was SBA at end of gait training secondary  to antalgic gait.   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   PT Frequency 2x / week   PT Duration 8 weeks   PT Treatment/Interventions ADLs/Self Care Home Management;Stair training;Gait training;Therapeutic activities;Therapeutic exercise;Balance training;Neuromuscular re-education;Patient/family education   PT Next Visit Plan cont with POC for Balance and gait activities; continue stretching hips   PT Home Exercise Plan stretches for HS, ITB and piriformis;  SLS at counter   Consulted and Agree with Plan of Care Patient      Patient will benefit from skilled therapeutic intervention in order to improve the following deficits and impairments:  Abnormal gait, Decreased balance, Difficulty walking  Visit Diagnosis: Unsteadiness on feet  Difficulty walking     Problem List Patient Active Problem List   Diagnosis Date Noted  . Ischemic cardiomyopathy 06/30/2016  . Diabetes mellitus with complication ([place])   . Diabetes mellitus type 2 in obese ([place])   . Acute blood loss anemia   . Myoclonus   . Anoxic brain injury ([place])   . Seizures ([place])   . Dilated cardiomyopathy ([place]) 05/24/2016  . Coronary artery disease involving native coronary artery of native heart with angina pectoris ([place]) 05/24/2016  . Chronic combined systolic and diastolic CHF, NYHA class 2 and ACA/AHA stage C ([place]) 05/13/2016  . Status post coronary artery stent placement   . Essential hypertension 09/21/2013  . Pulmonary nodules 10/16/2011   Madelyn Flavors PT 10/07/2016, 12:24 PM  [facility] [address] [place], [place], [address] Phone: [phone]   Fax:  [phone]  Name: Douglas Carr MRN: [id number] Date of Birth: 02/06/1966  PHYSICAL THERAPY DISCHARGE SUMMARY  Visits from Start of Care: 13.  Current functional level related to goals / functional  outcomes: See above.   Remaining deficits: Goal #3 unmet.   Education / Equipment: HEP. Plan: Patient agrees to discharge.  Patient goals were partially met. Patient is being discharged due to being pleased with the current functional level.  ?????         Mali Applegate MPT

## 2016-10-07 NOTE — Patient Instructions (Signed)
   Solon PalmJulie Aubery Douthat, PT 10/07/16 12:03 PM [facility] [address] [place], [place], [id number] Phone: [phone]   Fax:  [phone]

## 2016-10-07 NOTE — Progress Notes (Signed)
Electrophysiology Office Note   Date:  10/07/2016   ID:  Douglas Carr, DOB 08-19-1965, MRN [id number]  PCP:  [name], MD  Cardiologist:  Allyson Sabal Primary Electrophysiologist:  Jonahtan Manseau Jorja Loa, MD    Chief Complaint  Patient presents with  . Defib Check    Chronic systolic HF/91 days post implant     History of Present Illness: Douglas Carr is a 51 y.o. male who presents today for electrophysiology evaluation.   Admitted in early January after a STEMI with PCI to the OM 2 on 05/11/16. He then re-presented to the hospital with cardiac arrest and ventricular fibrillation when he was found pulseless by his family. CPR was initiated within 1 minute. He had an AED shock in the field. He was taken back to the cath lab and was found to have an edge dissection with thrombus in the circumflex treated with overlapping drug-eluting stents. He was placed on the cooling protocol. It was thought at the time that his arrest was caused by VT VF and not a myocardial infarction. Due to that, it was thought that a defibrillator would be indicated. His hospital course was complicated by anoxic encephalopathy. It was thought that he had anoxic brain injury from a cardiac arrest causing a left thalamic stroke.   Today, denies symptoms of palpitations, chest pain, shortness of breath, orthopnea, PND, lower extremity edema, claudication, dizziness, presyncope, syncope, bleeding, or neurologic sequela. The patient is tolerating medications without difficulties and is otherwise without complaint today. He had a Medtronic ICD implanted for secondary prevention on 06/30/16. He is been able to work with cardiac rehabilitation, working at 15-20 minutes time. He still has some memory issues but that is significantly improved.   Past Medical History:  Diagnosis Date  . Acute ST elevation myocardial infarction (STEMI) involving left circumflex coronary artery (HCC) 05/07/2016   PCI to Cx-OM  .  Anginal pain (HCC)    secondary to sm. vessel disease  . Anxiety   . Arthritis    BIL KNEE PAIN AND BIL ANKLE PAIN  . Bone spur of ankle   . Cardiac arrest (HCC) 05/24/2016   with v fib  . 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 Coronary artery disease involving native coronary artery of native heart with angina pectoris (HCC) 05/24/2016   Remote MI at 51 years of age, Last cath 2010-diffuse non-obstructive disease; last echo 06/16/08 -normal LV function, moderate concentric hypertrophy; nuc 08/2008 no ischemia;  medical therapy; STEMI May 07 2016 - PCI to Cx-OM  . Diabetes mellitus    ON ORAL MEDICATION AND INSULIN  . Dilated cardiomyopathy (HCC) 05/24/2016   EF 325-30% by Echo post STEMI (previously 30-35%)   . Hyperlipidemia   . Hypertension   . Myocardial infarction (HCC) 1996   Post MI  . Peripheral vascular disease (HCC)    HAS LEFT CAROTID ARTERY STENOSIS   AND IS S/P RIGHT CAROTID ENDARTERECTOMY 2010 Last carotid dopplers 01/08/2012 wth patent endarterectomy site  . Status post coronary artery stent placement    Past Surgical History:  Procedure Laterality Date  . APPENDECTOMY    . CARDIAC CATHETERIZATION     FEB 2010, significant branch vessel disease wth diag, marginal, PDA & PLA, nml. LV function  . CARDIAC CATHETERIZATION N/A 05/07/2016   Procedure: Left Heart Cath and Coronary Angiography;  Surgeon: Peter M Swaziland, MD;  Location: [facility] INVASIVE CV LAB;  Service: Cardiovascular;  Laterality: N/A;  . CARDIAC CATHETERIZATION N/A 05/07/2016   Procedure: Coronary Stent Intervention;  Surgeon: Peter M SwazilandJordan, MD;  [facility];  Service: Cardiovascular;  Laterality: N/A;  . CARDIAC CATHETERIZATION N/A 05/24/2016   Procedure: Left Heart Cath and Coronary Angiography;  Surgeon: Marykay Lexavid W Harding, MD;  [facility];  Service: Cardiovascular;  Laterality: N/A;  . CARDIAC CATHETERIZATION N/A 05/24/2016    Procedure: Coronary Stent Intervention;  Surgeon: Marykay Lexavid W Harding, MD;  [facility];  Service: Cardiovascular;  Laterality: N/A;  2.5x20 Promus to Ostial/proximal circumflex  . CAROTID ENDARTERECTOMY  09/2008   Rt CEA  . ICD IMPLANT N/A 06/30/2016   Procedure: ICD Implant;  Surgeon: Shayna Eblen Jorja LoaMartin Collyn Ribas, MD;  Location: [facility] INVASIVE CV LAB;  Service: Cardiovascular;  Laterality: N/A;  . LUNG BIOPSY  2013   Bx's suggest granulomatous dz.  . RT ANKLE   2013     Current Outpatient Prescriptions  Medication Sig Dispense Refill  . acetaminophen (TYLENOL) 325 MG tablet Take 2 tablets (650 mg total) by mouth every 4 (four) hours as needed for headache or mild pain.    Marland Kitchen. aspirin 81 MG chewable tablet Chew 1 tablet (81 mg total) by mouth daily.    Marland Kitchen. atorvastatin (LIPITOR) 80 MG tablet TAKE 1 TABLET BY MOUTH DAILY 30 tablet 4  . clonazePAM (KLONOPIN) 0.5 MG tablet TAKE 1 TABLET BY MOUTH TWICE A DAY 60 tablet 1  . clopidogrel (PLAVIX) 75 MG tablet TAKE 1 TABLET DAILY IN THE EVENING 30 tablet 1  . KLOR-CON M20 20 MEQ tablet TAKE 1 TABLET DAILY 30 tablet 4  . levETIRAcetam (KEPPRA) 100 MG/ML solution Take 10 mLs (1,000 mg total) by mouth 2 (two) times daily. 473 mL 1  . levETIRAcetam (KEPPRA) 1000 MG tablet TAKE 1 TABLET BY MOUTH TWICE A DAY 60 tablet 1  . lisinopril (PRINIVIL,ZESTRIL) 2.5 MG tablet TAKE 1 TABLET DAILY 30 tablet 4  . metFORMIN (GLUCOPHAGE) 500 MG tablet TAKE 1 TABLET TWICE A DAY 60 tablet 1  . mirtazapine (REMERON) 7.5 MG tablet TAKE 1 TABLET AT BEDTIME FOR DEPRESSION 30 tablet 4  . NOVOLOG FLEXPEN 100 UNIT/ML FlexPen INJECT PER SLIDING SCALE BEFORE MEALS 201-250: 6UNITS, 251-300: 8U, 301-350: 10U, 351+ : CALL MD 15 mL 6  . tamsulosin (FLOMAX) 0.4 MG CAPS capsule TAKE 1 CAPSULE DAILY IN THE EVENING 30 capsule 4  . torsemide (DEMADEX) 20 MG tablet TAKE 1 TABLET DAILY 30 tablet 4  . TRESIBA FLEXTOUCH 200 UNIT/ML SOPN INJECT 115 UNITS INTO THE SKIN DAILY 18 pen 1   No  current facility-administered medications for this visit.     Allergies:   Patient has no known allergies.   Social History:  The patient  reports that he quit smoking about 4 years ago. His smoking use included Cigarettes. He has a 44.00 pack-year smoking history. His smokeless tobacco use includes Snuff. He reports that he does not drink alcohol or use drugs.   Family History:  The patient's family history includes Diabetes in his mother; Heart attack in his paternal grandfather; Heart attack (age of onset: 4569) in his father; Hypertension in his maternal grandfather and mother.    ROS:  Please see the history of present illness.   Otherwise, review of systems is positive for none.   All other systems are reviewed and negative.     PHYSICAL EXAM: VS:  BP 114/82   Pulse 90   Ht 5\' 9"  (1.753  m)   Wt 230 lb 6.4 oz (104.5 kg)   BMI 34.02 kg/m  , BMI Body mass index is 34.02 kg/m. GEN: Well nourished, well developed, in no acute distress  HEENT: normal  Neck: no JVD, carotid bruits, or masses Cardiac: RRR; no murmurs, rubs, or gallops,no edema  Respiratory:  clear to auscultation bilaterally, normal work of breathing GI: soft, nontender, nondistended, + BS MS: no deformity or atrophy  Skin: warm and dry, device site well healed Neuro:  Strength and sensation are intact Psych: euthymic mood, full affect  EKG:  EKG is ordered today. Personal review of the ekg ordered  shows sinus rhythm, rate 90, left anterior fascicular block, lateral T wave inversions  Personal review of the device interrogation today. Results in Paceart  Recent Labs: 05/07/2016: TSH 3.503 05/12/2016: B Natriuretic Peptide 610.1 06/06/2016: Magnesium 2.4 06/17/2016: Hemoglobin 10.8 08/13/2016: ALT 22; BUN 22; Creatinine, Ser 0.96; Platelets 221; Potassium 4.6; Sodium 145    Lipid Panel     Component Value Date/Time   CHOL 118 08/13/2016 1439   TRIG 93 08/13/2016 1439   HDL 31 (L) 08/13/2016 1439   CHOLHDL 3.8  08/13/2016 1439   CHOLHDL 4.7 05/24/2016 1537   VLDL 28 05/24/2016 1537   LDLCALC 68 08/13/2016 1439     Wt Readings from Last 3 Encounters:  10/07/16 230 lb 6.4 oz (104.5 kg)  08/13/16 228 lb (103.4 kg)  07/31/16 233 lb (105.7 kg)      Other studies Reviewed: Additional studies/ records that were reviewed today include: Cath 05/24/16, TTE 05/15/16  Review of the above records today demonstrates:   Ost Cx to Mid Cx lesion, 70 %stenosed leading into OM2 as the main trunk of the Circumflex.  Ost 2nd Mrg to 2nd Mrg recent Promus DES 2.5 x 24 stent, - focal 90 %stenosed with what appears to be proximal edge dissection with thrombus  A STENT PROMUS PREM MR 2.5X20 drug eluting stent was successfully placed from Ostium of Circumflex into OM2, and overlaps previously placed stent.  Post intervention, there is a 0% residual stenosis.  ____________________________________________________  Suezanne Jacquet LAD to Prox LAD lesion, 30 %stenosed.  Ost 1st Diag to 1st Diag lesion, 70 %stenosed.  Ost 1st Mrg to 1st Mrg lesion, 35 %stenosed. Ost 2nd Diag to 2nd Diag lesion, 50 %stenosed.  Prox RCA to Mid RCA lesion, 65 %stenosed - lesion appears similar to prior cath, with mild progression.  Very distal RPDA lesion, 100 %stenosed.  LV end diastolic pressure is severely elevated.  There is no aortic valve stenosis.    - Left ventricle: The cavity size was moderately dilated. Wall   thickness was normal. Systolic function was severely reduced. The   estimated ejection fraction was in the range of 25% to 30%.   Akinesis and scarring of the anteroseptal and anterior   myocardium; consistent with infarction in the distribution of the   left anterior descending coronary artery. Dyskinesis of the   apicalanterior myocardium. Features are consistent with a   pseudonormal left ventricular filling pattern, with concomitant   abnormal relaxation and increased filling pressure (grade 2   diastolic  dysfunction). No evidence of thrombus. - Left atrium: The atrium was mildly dilated. - Atrial septum: No defect or patent foramen ovale was identified.  ASSESSMENT AND PLAN:  1.  Ischemic cardiomyopathy: On optimal medical therapy with Coreg, lisinopril, aspirin, and Plavix. He is tachycardic today. We'll increase his carvedilol to 12.5 mg 2. Essential hypertension: Blood pressure well  controlled today  3. 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 No issues with device interrogation. No changes made for long-term device settings.  4. Hyperlipidemia: Continue Lipitor    Current medicines are reviewed at length with the patient today.   The patient does not have concerns regarding his medicines.  The following changes were made today:  Increase carvedilol to 12.5 mg  Labs/ tests ordered today include:  No orders of the defined types were placed in this encounter.    Disposition:   FU with Treavor Blomquist 9 months  Signed, Johsua Shevlin Jorja Loa, MD  10/07/2016 4:04 PM     [facility] [address] [place] [address] [phone] (office) [phone] (fax)

## 2016-10-07 NOTE — Patient Instructions (Addendum)
Medication Instructions:    Your physician has recommended you make the following change in your medication:  1) INCREASE Carvedilol to 12.5 mg twice daily  - If you need a refill on your cardiac medications before your next appointment, please call your pharmacy.   Labwork:  None ordered  Testing/Procedures:  None ordered  Follow-Up: Remote monitoring is used to monitor your Pacemaker of ICD from home. This monitoring reduces the number of office visits required to check your device to one time per year. It allows us to keep an eye on the functioning of your device to ensure it is working properly. You are scheduled for a device check from home on 01/06/2017. You may send your transmission at any time that day. If you have a wireless device, the transmission will be sent automatically. After your physician reviews your transmission, you will receive a postcard with your next transmission date.   Your physician wants you to follow-up in: 9 months with Dr. Elberta Fortisamnitz.  You will receive a reminder letter in the mail two months in advance. If you don't receive a letter, please call our office to schedule the follow-up appointment.  Thank you for choosing [facility]!!   Dory HornSherri Teodoro Jeffreys, RN [phone]

## 2016-10-08 ENCOUNTER — Encounter: Payer: BC Managed Care – PPO | Admitting: Physical Therapy

## 2016-10-13 ENCOUNTER — Encounter: Payer: BC Managed Care – PPO | Admitting: Physical Therapy

## 2016-10-15 ENCOUNTER — Encounter: Payer: BC Managed Care – PPO | Admitting: Physical Therapy

## 2016-10-30 DIAGNOSIS — Z0279 Encounter for issue of other medical certificate: Secondary | ICD-10-CM | POA: Diagnosis not present

## 2016-10-31 ENCOUNTER — Other Ambulatory Visit: Payer: Self-pay | Admitting: Family Medicine

## 2016-11-06 ENCOUNTER — Telehealth: Payer: Self-pay | Admitting: Family Medicine

## 2016-11-06 NOTE — Telephone Encounter (Signed)
Pt can take cough medications, but needs to avoid decongestant. Should take one that says safe for hypertension.

## 2016-11-06 NOTE — Telephone Encounter (Signed)
lmtcb

## 2016-11-06 NOTE — Telephone Encounter (Signed)
Please advise 

## 2016-11-10 ENCOUNTER — Other Ambulatory Visit: Payer: Self-pay | Admitting: Family Medicine

## 2016-11-10 NOTE — Telephone Encounter (Signed)
Last seen 08/13/16  Dr [name]    If approved route to nurse [facility]

## 2016-11-10 NOTE — Telephone Encounter (Signed)
Pt needs an appointment, I said to come back around now

## 2016-11-11 NOTE — Telephone Encounter (Signed)
What is the name of the medication? clonazePAM (KLONOPIN) 0.5 MG tablet  Have you contacted your pharmacy to request a refill? yes  [facility] [place]   Patient notified that their request is being sent to the clinical staff for review and that they should receive a call once it is complete. If they do not receive a call within 24 hours they can check with their pharmacy or our office.

## 2016-11-11 NOTE — Telephone Encounter (Signed)
He has appt scheduled 11/19/16

## 2016-11-11 NOTE — Telephone Encounter (Signed)
[place]. Please refill and call in today! He has appt July 18th. This is for his tremors.

## 2016-11-11 NOTE — Telephone Encounter (Signed)
Called in.

## 2016-11-13 ENCOUNTER — Ambulatory Visit: Payer: BC Managed Care – PPO | Admitting: Family Medicine

## 2016-11-19 ENCOUNTER — Encounter: Payer: Self-pay | Admitting: Family Medicine

## 2016-11-19 ENCOUNTER — Ambulatory Visit (INDEPENDENT_AMBULATORY_CARE_PROVIDER_SITE_OTHER): Payer: BC Managed Care – PPO | Admitting: Family Medicine

## 2016-11-19 VITALS — BP 126/74 | HR 62 | Temp 97.8°F | Ht 69.0 in | Wt 228.0 lb

## 2016-11-19 DIAGNOSIS — E118 Type 2 diabetes mellitus with unspecified complications: Secondary | ICD-10-CM

## 2016-11-19 DIAGNOSIS — E1169 Type 2 diabetes mellitus with other specified complication: Secondary | ICD-10-CM | POA: Diagnosis not present

## 2016-11-19 DIAGNOSIS — M25551 Pain in right hip: Secondary | ICD-10-CM | POA: Diagnosis not present

## 2016-11-19 DIAGNOSIS — E669 Obesity, unspecified: Secondary | ICD-10-CM

## 2016-11-19 DIAGNOSIS — I10 Essential (primary) hypertension: Secondary | ICD-10-CM | POA: Diagnosis not present

## 2016-11-19 DIAGNOSIS — F32A Depression, unspecified: Secondary | ICD-10-CM | POA: Insufficient documentation

## 2016-11-19 DIAGNOSIS — F329 Major depressive disorder, single episode, unspecified: Secondary | ICD-10-CM | POA: Diagnosis not present

## 2016-11-19 DIAGNOSIS — F419 Anxiety disorder, unspecified: Secondary | ICD-10-CM | POA: Diagnosis not present

## 2016-11-19 DIAGNOSIS — M25552 Pain in left hip: Secondary | ICD-10-CM

## 2016-11-19 LAB — BAYER DCA HB A1C WAIVED: HB A1C (BAYER DCA - WAIVED): 6.4 % (ref <7.0)

## 2016-11-19 MED ORDER — DULOXETINE HCL 30 MG PO CPEP: 30.0000 mg | ORAL_CAPSULE | Freq: Every day | ORAL | 1 refills | Status: DC

## 2016-11-19 MED ORDER — MELOXICAM 15 MG PO TABS: 15.0000 mg | ORAL_TABLET | Freq: Every day | ORAL | 0 refills | Status: DC

## 2016-11-19 NOTE — Progress Notes (Signed)
BP 126/74   Pulse 62   Temp 97.8 F (36.6 C) (Oral)   Ht 5\' 9"  (1.753 m)   Wt 228 lb (103.4 kg)   BMI 33.67 kg/m    Subjective:    Patient ID: Douglas Carr, male    DOB: 1965/08/19, 51 y.o.   MRN: [id number]  HPI: Douglas Carr is a 51 y.o. male presenting on 11/19/2016 for Diabetes (followup); Hypertension; and Depression   HPI Type 2 diabetes mellitus Patient comes in today for recheck of his diabetes. Patient has been currently taking Tresiba 160 units, metformin, NovoLog sliding scale. Patient is currently on an ACE inhibitor/ARB. Patient has not seen an ophthalmologist this year. Patient denies any new issues with their feet.   Hypertension Patient is currently on lisinopril 2.5 mg and carvedilol 12.5 twice a day, and their blood pressure today is 126/74. Patient denies any lightheadedness or dizziness. Patient denies headaches, blurred vision, chest pains, shortness of breath, or weakness. Denies any side effects from medication and is content with current medication.   Anxiety depression Patient is coming in to discuss anxiety and depression as well. He says he has been feeling down a lot more since his anoxic brain injury which is left him unable to work and now he is having to apply for disability. He says that is especially affecting him that he is no longer able to work away that he did previously and dealing with that has been filled down and depressed and anxious. He denies any suicidal ideations or thoughts of hurting himself he does have thoughts of death and sometimes wishes he just would not of survived. His spouse is with him today and is very supportive and help him through all of this and he does have other family members that are very supportive. He is not currently seeing a counselor but will find one that he can see.  Depression screen [facility] 2/9 11/19/2016 08/13/2016 05/23/2016 02/19/2016 11/14/2015  Decreased Interest 1 0 0 0 0  Down, Depressed, Hopeless 1  0 0 0 0  PHQ - 2 Score 2 0 0 0 0  Altered sleeping 2 - - - -  Tired, decreased energy 2 - - - -  Change in appetite 1 - - - -  Feeling bad or failure about yourself  2 - - - -  Trouble concentrating 2 - - - -  Moving slowly or fidgety/restless 2 - - - -  Suicidal thoughts 2 - - - -  PHQ-9 Score 15 - - - -  Difficult doing work/chores Very difficult - - - -    Bilateral hip pain Patient has been having increased bilateral hip pain and has been having a lot of issues with it since his gait instability be cause of his anoxic brain injury and left-sided weakness. He is working on this with physical therapy already and we just encouraged for him to keep going with that and use Tylenol and ibuprofen as needed.  Relevant past medical, surgical, family and social history reviewed and updated as indicated. Interim medical history since our last visit reviewed. Allergies and medications reviewed and updated.  Review of Systems  Constitutional: Negative for chills and fever.  Eyes: Negative for discharge.  Respiratory: Negative for shortness of breath and wheezing.   Cardiovascular: Negative for chest pain and leg swelling.  Musculoskeletal: Positive for arthralgias and gait problem. Negative for back pain.  Skin: Negative for rash.  Neurological: Positive for weakness and numbness.  Negative for dizziness, facial asymmetry and light-headedness.  Psychiatric/Behavioral: Positive for decreased concentration, dysphoric mood and sleep disturbance. Negative for self-injury and suicidal ideas. The patient is nervous/anxious.   All other systems reviewed and are negative.   Per HPI unless specifically indicated above        Objective:    BP 126/74   Pulse 62   Temp 97.8 F (36.6 C) (Oral)   Ht 5\' 9"  (1.753 m)   Wt 228 lb (103.4 kg)   BMI 33.67 kg/m   Wt Readings from Last 3 Encounters:  11/19/16 228 lb (103.4 kg)  10/07/16 230 lb 6.4 oz (104.5 kg)  08/13/16 228 lb (103.4 kg)      Physical Exam  Constitutional: He is oriented to person, place, and time. He appears well-developed and well-nourished. No distress.  Eyes: Conjunctivae are normal. No scleral icterus.  Cardiovascular: Normal rate, regular rhythm, normal heart sounds and intact distal pulses.   No murmur heard. Pulmonary/Chest: Effort normal and breath sounds normal. No respiratory distress. He has no wheezes.  Musculoskeletal: Normal range of motion. He exhibits tenderness (Bilateral hip pain with range of motion, more on the lateral aspect of both hips. Likely IT band or trochanteric bursitis, recommend continue strengthening with physical therapy). He exhibits no edema.  Neurological: He is alert and oriented to person, place, and time. A sensory deficit (Slight sensory deficit left side in his legs and his hand) is present. He exhibits abnormal muscle tone (Slight left-sided weakness, 4 out of 5 in arm and leg). Coordination normal.  Skin: Skin is warm and dry. No rash noted. He is not diaphoretic.  Psychiatric: His behavior is normal. His mood appears anxious. He exhibits a depressed mood.  Nursing note and vitals reviewed.       Assessment & Plan:   Problem List Items Addressed This Visit      Cardiovascular and Mediastinum   Essential hypertension (Chronic)     Endocrine   Diabetes mellitus type 2 in obese (HCC) - Primary   Relevant Orders   Bayer DCA Hb A1c Waived (Completed)   Diabetes mellitus with complication (HCC)   Relevant Orders   Bayer DCA Hb A1c Waived (Completed)     Other   Anxiety and depression   Relevant Medications   DULoxetine (CYMBALTA) 30 MG capsule    Other Visit Diagnoses    Bilateral hip pain       Likely from gait instability secondary to CVA, working with physical therapy to gain strength back. We'll give meloxicam   Relevant Medications   meloxicam (MOBIC) 15 MG tablet       Follow up plan: Return in about 4 weeks (around 12/17/2016), or if symptoms worsen  or fail to improve, for Anxiety depression recheck.  Counseling provided for all of the vaccine components Orders Placed This Encounter  Procedures  . Bayer [facility] Hb A1c Waived    Arville Care, MD [facility] 11/19/2016, 3:44 PM

## 2016-11-20 NOTE — Telephone Encounter (Signed)
Aware at appointment. 

## 2016-11-25 ENCOUNTER — Encounter: Payer: Self-pay | Admitting: Vascular Surgery

## 2016-12-02 ENCOUNTER — Other Ambulatory Visit: Payer: Self-pay | Admitting: Family Medicine

## 2016-12-02 ENCOUNTER — Ambulatory Visit (INDEPENDENT_AMBULATORY_CARE_PROVIDER_SITE_OTHER): Payer: BC Managed Care – PPO | Admitting: Family Medicine

## 2016-12-02 DIAGNOSIS — I110 Hypertensive heart disease with heart failure: Secondary | ICD-10-CM

## 2016-12-02 DIAGNOSIS — I25119 Atherosclerotic heart disease of native coronary artery with unspecified angina pectoris: Secondary | ICD-10-CM | POA: Diagnosis not present

## 2016-12-02 DIAGNOSIS — E669 Obesity, unspecified: Secondary | ICD-10-CM

## 2016-12-02 DIAGNOSIS — I5043 Acute on chronic combined systolic (congestive) and diastolic (congestive) heart failure: Secondary | ICD-10-CM | POA: Diagnosis not present

## 2016-12-02 DIAGNOSIS — I420 Dilated cardiomyopathy: Secondary | ICD-10-CM

## 2016-12-02 DIAGNOSIS — Z48812 Encounter for surgical aftercare following surgery on the circulatory system: Secondary | ICD-10-CM | POA: Diagnosis not present

## 2016-12-02 DIAGNOSIS — E1151 Type 2 diabetes mellitus with diabetic peripheral angiopathy without gangrene: Secondary | ICD-10-CM

## 2016-12-02 DIAGNOSIS — Z6831 Body mass index (BMI) 31.0-31.9, adult: Secondary | ICD-10-CM

## 2016-12-02 DIAGNOSIS — G4089 Other seizures: Secondary | ICD-10-CM

## 2016-12-08 ENCOUNTER — Encounter: Payer: Self-pay | Admitting: *Deleted

## 2016-12-08 ENCOUNTER — Telehealth: Payer: Self-pay | Admitting: Family Medicine

## 2016-12-08 NOTE — Telephone Encounter (Signed)
I agree that patient cannot be working at this point but I do not know exactly what he wants the latter had to say my he is on short-term disability and is applying for long-term disability. He has had a stroke and working with physical therapy as the diagnostic reason for why he is off work.

## 2016-12-08 NOTE — Telephone Encounter (Signed)
Letter signed by Dr D and up front // faxed to preferred number.   Wife aware

## 2016-12-08 NOTE — Telephone Encounter (Signed)
The insurance needs a letter stating that Douglas Carr is being seeing and followed by Dr D,  That he remains out of work and a anticipated return date (jan 2019 - requested by wife)  His SS# needs to be on there as well - per wife.  Fax # [phone] DR D - I will type up if you will sign

## 2016-12-11 ENCOUNTER — Other Ambulatory Visit: Payer: Self-pay | Admitting: Family Medicine

## 2016-12-12 ENCOUNTER — Ambulatory Visit (HOSPITAL_COMMUNITY)
Admission: RE | Admit: 2016-12-12 | Discharge: 2016-12-12 | Disposition: A | Discharge status: Home / Self Care | Payer: BC Managed Care – PPO | Source: Ambulatory Visit | Attending: Physician Assistant | Admitting: Physician Assistant

## 2016-12-12 ENCOUNTER — Encounter: Payer: Self-pay | Admitting: Vascular Surgery

## 2016-12-12 ENCOUNTER — Ambulatory Visit (INDEPENDENT_AMBULATORY_CARE_PROVIDER_SITE_OTHER): Payer: BC Managed Care – PPO | Admitting: Vascular Surgery

## 2016-12-12 VITALS — BP 136/80 | HR 82 | Temp 98.1°F | Resp 16 | Ht 69.0 in | Wt 225.0 lb

## 2016-12-12 DIAGNOSIS — I6522 Occlusion and stenosis of left carotid artery: Secondary | ICD-10-CM

## 2016-12-12 DIAGNOSIS — I779 Disorder of arteries and arterioles, unspecified: Secondary | ICD-10-CM

## 2016-12-12 DIAGNOSIS — I739 Peripheral vascular disease, unspecified: Secondary | ICD-10-CM

## 2016-12-12 NOTE — Progress Notes (Signed)
Patient ID: Douglas Carr, male   DOB: 1965/12/03, 51 y.o.   MRN: [id number]  Reason for Consult: Carotid (6 mth f/u Carotid Bilat. Occluded R ICA. )   Referred by [name], MD  Subjective:     HPI:  Douglas Carr is a 51 y.o. male with a history of right carotid endarterectomy and was hospitalized back in February cardiac arrest and underwent hypothermia protocol at that time. At the time he was noted to have an occluded right carotid endarterectomy site with stenosis of the left ICA. He has now survived that insult and is doing well AND speech difficulties. He has been unable to return to work given his neurologic sequela. He has had no frank sequela of his left carotid stenosis. He does take aspirin and statin drug. Overall he has had remarkable recovery.  Past Medical History:  Diagnosis Date  . Acute ST elevation myocardial infarction (STEMI) involving left circumflex coronary artery (HCC) 05/07/2016   PCI to Cx-OM  . Anginal pain (HCC)    secondary to sm. vessel disease  . Anxiety   . Arthritis    BIL KNEE PAIN AND BIL ANKLE PAIN  . Bone spur of ankle   . Cardiac arrest (HCC) 05/24/2016   with v fib  . 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 Coronary artery disease involving native coronary artery of native heart with angina pectoris (HCC) 05/24/2016   Remote MI at 51 years of age, Last cath 2010-diffuse non-obstructive disease; last echo 06/16/08 -normal LV function, moderate concentric hypertrophy; nuc 08/2008 no ischemia;  medical therapy; STEMI May 07 2016 - PCI to Cx-OM  . Diabetes mellitus    ON ORAL MEDICATION AND INSULIN  . Dilated cardiomyopathy (HCC) 05/24/2016   EF 325-30% by Echo post STEMI (previously 30-35%)   . Hyperlipidemia   . Hypertension   . Myocardial infarction (HCC) 1996   Post MI  . Peripheral vascular disease (HCC)    HAS LEFT CAROTID ARTERY STENOSIS   AND IS S/P RIGHT  CAROTID ENDARTERECTOMY 2010 Last carotid dopplers 01/08/2012 wth patent endarterectomy site  . Status post coronary artery stent placement    Family History  Problem Relation Age of Onset  . Hypertension Mother   . Diabetes Mother   . Hypertension Maternal Grandfather   . Heart attack Paternal Grandfather   . Heart attack Father 43   Past Surgical History:  Procedure Laterality Date  . APPENDECTOMY    . CARDIAC CATHETERIZATION     FEB 2010, significant branch vessel disease wth diag, marginal, PDA & PLA, nml. LV function  . CARDIAC CATHETERIZATION N/A 05/07/2016   Procedure: Left Heart Cath and Coronary Angiography;  Surgeon: Peter M Swaziland, MD;  Location: [facility] INVASIVE CV LAB;  Service: Cardiovascular;  Laterality: N/A;  . CARDIAC CATHETERIZATION N/A 05/07/2016   Procedure: Coronary Stent Intervention;  Surgeon: Peter M Swaziland, MD;  Location: [facility] INVASIVE CV LAB;  Service: Cardiovascular;  Laterality: N/A;  . CARDIAC CATHETERIZATION N/A 05/24/2016   Procedure: Left Heart Cath and Coronary Angiography;  Surgeon: Marykay Lex, MD;  Location: [facility] INVASIVE CV LAB;  Service: Cardiovascular;  Laterality: N/A;  . CARDIAC CATHETERIZATION N/A 05/24/2016   Procedure: Coronary Stent Intervention;  Surgeon: Marykay Lex, MD;  Location: [facility] INVASIVE CV LAB;  Service: Cardiovascular;  Laterality: N/A;  2.5x20 Promus to Ostial/proximal circumflex  . CAROTID ENDARTERECTOMY  09/2008   Rt CEA  .  ICD IMPLANT N/A 06/30/2016   Procedure: ICD Implant;  Surgeon: Will Jorja LoaMartin Camnitz, MD;  Location: [facility] INVASIVE CV LAB;  Service: Cardiovascular;  Laterality: N/A;  . LUNG BIOPSY  2013   Bx's suggest granulomatous dz.  . RT ANKLE   2013    Short Social History:  Social History  Substance Use Topics  . Smoking status: Former Smoker    Packs/day: 2.00    Years: 22.00    Types: Cigarettes    Quit date: 10/23/2011  . Smokeless tobacco: Current User    Types: Snuff  . Alcohol use No    No Known  Allergies  Current Outpatient Prescriptions  Medication Sig Dispense Refill  . acetaminophen (TYLENOL) 325 MG tablet Take 2 tablets (650 mg total) by mouth every 4 (four) hours as needed for headache or mild pain.    Marland Kitchen. aspirin 81 MG chewable tablet Chew 1 tablet (81 mg total) by mouth daily.    Marland Kitchen. atorvastatin (LIPITOR) 80 MG tablet TAKE 1 TABLET BY MOUTH DAILY 30 tablet 4  . carvedilol (COREG) 12.5 MG tablet Take 1 tablet (12.5 mg total) by mouth 2 (two) times daily. 180 tablet 3  . clonazePAM (KLONOPIN) 0.5 MG tablet TAKE 1 TABLET BY MOUTH TWICE A DAY 60 tablet 1  . clopidogrel (PLAVIX) 75 MG tablet TAKE 1 TABLET BY MOUTH EVERY DAY IN THE EVENING 30 tablet 1  . DULoxetine (CYMBALTA) 30 MG capsule Take 1 capsule (30 mg total) by mouth daily. 30 capsule 1  . KLOR-CON M20 20 MEQ tablet TAKE 1 TABLET DAILY 30 tablet 4  . levETIRAcetam (KEPPRA) 100 MG/ML solution Take 10 mLs (1,000 mg total) by mouth 2 (two) times daily. 473 mL 1  . levETIRAcetam (KEPPRA) 1000 MG tablet TAKE 1 TABLET BY MOUTH TWICE A DAY 60 tablet 1  . lisinopril (PRINIVIL,ZESTRIL) 2.5 MG tablet TAKE 1 TABLET DAILY 30 tablet 4  . meloxicam (MOBIC) 15 MG tablet Take 1 tablet (15 mg total) by mouth daily. 30 tablet 0  . metFORMIN (GLUCOPHAGE) 500 MG tablet TAKE 1 TABLET BY MOUTH TWICE A DAY 60 tablet 3  . mirtazapine (REMERON) 7.5 MG tablet TAKE 1 TABLET AT BEDTIME FOR DEPRESSION 30 tablet 4  . NOVOLOG FLEXPEN 100 UNIT/ML FlexPen INJECT PER SLIDING SCALE BEFORE MEALS 201-250: 6UNITS, 251-300: 8U, 301-350: 10U, 351+ : CALL MD 15 mL 6  . ONETOUCH VERIO test strip TEST TWICE DAILY 100 each 11  . tamsulosin (FLOMAX) 0.4 MG CAPS capsule TAKE 1 CAPSULE DAILY IN THE EVENING 30 capsule 4  . torsemide (DEMADEX) 20 MG tablet TAKE 1 TABLET DAILY 30 tablet 4  . TRESIBA FLEXTOUCH 200 UNIT/ML SOPN INJECT 115 UNITS INTO THE SKIN DAILY 18 pen 1   No current facility-administered medications for this visit.     Review of Systems   Constitutional:  Constitutional negative. HENT: HENT negative.  Eyes: Eyes negative.  Respiratory: Respiratory negative.  Cardiovascular: Cardiovascular negative.  GI: Gastrointestinal negative.  Skin: Skin negative.  Neurological: Positive for focal weakness.  Hematologic: Hematologic/lymphatic negative.  Psychiatric: Psychiatric negative.        Objective:  Objective   Vitals:   12/12/16 1153 12/12/16 1156  BP: 134/75 136/80  Pulse: 82   Resp: 16   Temp: 98.1 F (36.7 C)   TempSrc: Oral   SpO2: 98%   Weight: 225 lb (102.1 kg)   Height: 5\' 9"  (1.753 m)    Body mass index is 33.23 kg/m.  Physical Exam  Constitutional:  He is oriented to person, place, and time. He appears well-developed.  HENT:  Head: Normocephalic.  Eyes: Pupils are equal, round, and reactive to light.  Neck: Normal range of motion.  Cardiovascular: Normal rate.   Pulses:      Radial pulses are 2+ on the right side, and 2+ on the left side.  Pulmonary/Chest: Effort normal.  Abdominal: Soft. He exhibits no mass.  Musculoskeletal: Normal range of motion. He exhibits no edema.  Neurological: He is alert and oriented to person, place, and time.  Skin: Skin is warm and dry.  Psychiatric: He has a normal mood and affect. His behavior is normal. Judgment and thought content normal.    Data: I've independently interpreted his cerebrovascular duplex demonstrated known occlusion of his right common internal and external carotid arteries. The left side has elevated velocity to 185 peak systolic velocity demonstrates 60-79% stenosis.     Assessment/Plan:     51 year old male recently admitted with cardiac event and underwent hypothermic protocol and had bilateral cortical infarcts from MRI. He has now a known occluded right carotid endarterectomy site with stenosis in the 60-79% range on the left although this may be falsely elevated given the contralateral occlusion. He is on aspirin and statin drugs and  should remain such. We discussed the signs and symptoms of stroke TIA and amaurosis. Should he remain free of these we'll see him in one year with repeat carotid duplex. He comes risk good understanding along with his wife.      Maeola Harman MD [facility]

## 2016-12-16 ENCOUNTER — Other Ambulatory Visit: Payer: Self-pay | Admitting: Family Medicine

## 2016-12-16 DIAGNOSIS — M25552 Pain in left hip: Principal | ICD-10-CM

## 2016-12-16 DIAGNOSIS — M25551 Pain in right hip: Secondary | ICD-10-CM

## 2016-12-22 ENCOUNTER — Ambulatory Visit (INDEPENDENT_AMBULATORY_CARE_PROVIDER_SITE_OTHER): Payer: BC Managed Care – PPO | Admitting: Family Medicine

## 2016-12-22 ENCOUNTER — Encounter: Payer: Self-pay | Admitting: Family Medicine

## 2016-12-22 VITALS — BP 110/60 | HR 66 | Temp 97.4°F | Ht 69.0 in | Wt 223.0 lb

## 2016-12-22 DIAGNOSIS — M545 Low back pain, unspecified: Secondary | ICD-10-CM

## 2016-12-22 DIAGNOSIS — F329 Major depressive disorder, single episode, unspecified: Secondary | ICD-10-CM

## 2016-12-22 DIAGNOSIS — F419 Anxiety disorder, unspecified: Secondary | ICD-10-CM

## 2016-12-22 DIAGNOSIS — F32A Depression, unspecified: Secondary | ICD-10-CM

## 2016-12-22 MED ORDER — DULOXETINE HCL 30 MG PO CPEP: 30.0000 mg | ORAL_CAPSULE | Freq: Every day | ORAL | 1 refills | Status: DC

## 2016-12-22 MED ORDER — BACLOFEN 10 MG PO TABS: 10.0000 mg | ORAL_TABLET | Freq: Three times a day (TID) | ORAL | 2 refills | Status: DC

## 2016-12-22 MED ORDER — BLOOD GLUCOSE MONITOR KIT: PACK | 0 refills | Status: DC

## 2016-12-22 NOTE — Progress Notes (Signed)
BP 110/60   Pulse 66   Temp (!) 97.4 F (36.3 C) (Oral)   Ht 5\' 9"  (1.753 m)   Wt 223 lb (101.2 kg)   BMI 32.93 kg/m    Subjective:    Patient ID: Douglas Carr, male    DOB: 12/11/1965, 51 y.o.   MRN: [id number]  HPI: Douglas Carr is a 51 y.o. male presenting on 12/22/2016 for anxiety   HPI Low back pain Patient comes in complaining of low back pain. He says about 1 week ago he fell right onto his back on a wooden floor. Since this time he has been having mid to low back pain across both sides and he feels like it is tight and that he will get spasming from it. He denies any numbness or weakness or any pain radiating anywhere else. He denies any loss of bowel or loss of bladder. The pain comes across his back in a bandlike position and has mainly made him feel very tight across his back.  Anxiety depression recheck Patient is coming in for anxiety and depression recheck as well. He is currently on Cymbalta 30 mg and per him and his wife he has been doing very well on it and they're happy with where he is at. He denies any major side effects from it. He still does use the Klonopin as needed but has been using it less frequently. He denies any suicidal ideations or thoughts. Himself.  Relevant past medical, surgical, family and social history reviewed and updated as indicated. Interim medical history since our last visit reviewed. Allergies and medications reviewed and updated.  Review of Systems  Constitutional: Negative for chills and fever.  Eyes: Negative for discharge.  Respiratory: Negative for shortness of breath and wheezing.   Cardiovascular: Negative for chest pain and leg swelling.  Musculoskeletal: Positive for back pain. Negative for gait problem.  Skin: Negative for rash.  Psychiatric/Behavioral: Positive for decreased concentration and dysphoric mood. The patient is nervous/anxious.   All other systems reviewed and are negative.  Per HPI unless  specifically indicated above     Objective:    BP 110/60   Pulse 66   Temp (!) 97.4 F (36.3 C) (Oral)   Ht 5\' 9"  (1.753 m)   Wt 223 lb (101.2 kg)   BMI 32.93 kg/m   Wt Readings from Last 3 Encounters:  12/22/16 223 lb (101.2 kg)  12/12/16 225 lb (102.1 kg)  11/19/16 228 lb (103.4 kg)    Physical Exam  Constitutional: He is oriented to person, place, and time. He appears well-developed and well-nourished. No distress.  Eyes: Conjunctivae are normal. No scleral icterus.  Cardiovascular: Normal rate, regular rhythm, normal heart sounds and intact distal pulses.   No murmur heard. Pulmonary/Chest: Effort normal and breath sounds normal. No respiratory distress. He has no wheezes. He has no rales.  Musculoskeletal: Normal range of motion. He exhibits no edema.       Lumbar back: He exhibits tenderness (Tenderness in a bandlike area, no radiation, negative straight leg raise).  Neurological: He is alert and oriented to person, place, and time. Coordination normal.  Skin: Skin is warm and dry. No rash noted. He is not diaphoretic.  Psychiatric: His behavior is normal. Judgment normal. His mood appears anxious. He exhibits a depressed mood. He expresses no suicidal ideation. He expresses no suicidal plans.  Nursing note and vitals reviewed.     Assessment & Plan:   Problem List Items Addressed  This Visit      Other   Anxiety and depression - Primary   Relevant Medications   DULoxetine (CYMBALTA) 30 MG capsule    Other Visit Diagnoses    Acute bilateral low back pain without sciatica       Relevant Medications   baclofen (LIORESAL) 10 MG tablet      Follow up plan: Return in about 2 months (around 02/21/2017), or if symptoms worsen or fail to improve, for dm, htn anxiety.  Counseling provided for all of the vaccine components No orders of the defined types were placed in this encounter.   Arville Care, MD [facility] 12/22/2016, 3:42  PM

## 2016-12-24 NOTE — Addendum Note (Signed)
Addended by: Burton Apley A on: 12/24/2016 11:34 AM   Modules accepted: Orders

## 2017-01-06 ENCOUNTER — Ambulatory Visit: Payer: BC Managed Care – PPO | Admitting: *Deleted

## 2017-01-09 ENCOUNTER — Ambulatory Visit (INDEPENDENT_AMBULATORY_CARE_PROVIDER_SITE_OTHER): Payer: BC Managed Care – PPO | Admitting: *Deleted

## 2017-01-09 DIAGNOSIS — I255 Ischemic cardiomyopathy: Secondary | ICD-10-CM | POA: Diagnosis not present

## 2017-01-09 LAB — CUP PACEART REMOTE DEVICE CHECK
Battery Remaining Longevity: 135 mo
Battery Voltage: 3.11 V
HighPow Impedance: 69 Ohm
Implantable Pulse Generator Implant Date: 20180226
Lead Channel Pacing Threshold Amplitude: 0.625 V
Lead Channel Pacing Threshold Pulse Width: 0.4 ms
Lead Channel Sensing Intrinsic Amplitude: 15.25 mV
Lead Channel Sensing Intrinsic Amplitude: 15.25 mV
Lead Channel Setting Pacing Amplitude: 2.5 V
Lead Channel Setting Pacing Pulse Width: 0.4 ms
Lead Channel Setting Sensing Sensitivity: 0.3 mV
MDC IDC LEAD IMPLANT DT: 20180226
MDC IDC LEAD LOCATION: 753860
MDC IDC MSMT LEADCHNL RV IMPEDANCE VALUE: 304 Ohm
MDC IDC MSMT LEADCHNL RV IMPEDANCE VALUE: 361 Ohm
MDC IDC SESS DTM: 20180907052509
MDC IDC STAT BRADY RV PERCENT PACED: 0.01 %

## 2017-01-13 ENCOUNTER — Encounter: Payer: Self-pay | Admitting: Cardiology

## 2017-01-13 ENCOUNTER — Other Ambulatory Visit: Payer: Self-pay | Admitting: Family Medicine

## 2017-01-13 NOTE — Progress Notes (Signed)
Remote ICD transmission.   

## 2017-01-18 ENCOUNTER — Other Ambulatory Visit: Payer: Self-pay | Admitting: Family

## 2017-01-20 NOTE — Progress Notes (Signed)
Subjective:   Douglas Carr was seen in consultation in the movement disorder clinic at the request of [name], Fransisca Kaufmann, MD.  Multiple hospital notes have been reviewed.  Pt accompanied by wife who supplements the history.  The patient was admitted for a lengthy hospital stay for V. fib arrest in January, 2018.  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  Wife and patient report that this movement is better.  The patient had a defibrillator placed in February, 2018.  Following the defibrillator placement, the patient reported that his tremor, which he had at baseline, got much worse.   Wife and patient state that he really didn't have tremor at baseline.  He was evaluated by neurology and it was felt that this could be either an unmasking of his baseline tremor or worsening of the myoclonus.  It was recommended that he stay on Keppra and add clonazepam.  He takes it twice per day.  It works but if he is stressed out he will note tremor.  If he has tremor now, it is whole body but legs more than arms.  No preference for one side of body.  More at night than during the day.  It may last for several hours and then will go away.  No family hx of tremor.    Current/Previously tried tremor medications: n/a  Current medications that may exacerbate tremor:  n/a  Outside reports reviewed: historical medical records, radiology reports and referral letter/letters.MRI brain done 06/02/16 and demonstrated L thalamic infarct (acute).  I did review this personally.  01/22/17 update:  Pt seen in follow up.  On keppra for post anoxic myoclonus and doing well in that regard.   Wife does not notice any jerking but patient states that he still has some.   He also has tremor for which he is on klonopin. Tremor is coming and going but seems to be a bit worse.   This got worse after defib placed in 06/2016.  He is on klonopin 0.5 mg bid and he was told  he could take an extra 1/2 tablet prn.  The records that were made available to me were reviewed.  Did not go back to work, which resulted in him having to apply for disability.  Some depression associated with inability to work.  No SI/HI.  Started on cymbalta by PCP.  Recently fell in the house.  Just lost his balance and hit back against door jam.  Last PT was 2 months ago.  Last ST was about 6 months ago.  No Known Allergies  Outpatient Encounter Prescriptions as of 01/22/2017  Medication Sig  . acetaminophen (TYLENOL) 325 MG tablet Take 2 tablets (650 mg total) by mouth every 4 (four) hours as needed for headache or mild pain.  Marland Kitchen aspirin 81 MG chewable tablet Chew 1 tablet (81 mg total) by mouth daily.  Marland Kitchen atorvastatin (LIPITOR) 80 MG tablet TAKE 1 TABLET BY MOUTH DAILY  . baclofen (LIORESAL) 10 MG tablet Take 1 tablet (10 mg total) by mouth 3 (three) times daily.  . carvedilol (COREG) 12.5 MG tablet Take 1 tablet (12.5 mg total) by mouth 2 (two) times daily.  . clonazePAM (KLONOPIN) 0.5 MG tablet TAKE 1 TABLET BY MOUTH TWICE A DAY  . clopidogrel (PLAVIX) 75 MG tablet TAKE 1 TABLET BY MOUTH EVERY DAY IN THE EVENING  . DULoxetine (CYMBALTA) 30 MG capsule Take 1 capsule (30 mg total)  by mouth daily.  Marland Kitchen KLOR-CON M20 20 MEQ tablet TAKE 1 TABLET DAILY  . levETIRAcetam (KEPPRA) 1000 MG tablet TAKE 1 TABLET BY MOUTH TWICE A DAY  . lisinopril (PRINIVIL,ZESTRIL) 2.5 MG tablet TAKE 1 TABLET DAILY  . meloxicam (MOBIC) 15 MG tablet TAKE 1 TABLET BY MOUTH EVERY DAY  . metFORMIN (GLUCOPHAGE) 500 MG tablet TAKE 1 TABLET BY MOUTH TWICE A DAY  . mirtazapine (REMERON) 7.5 MG tablet TAKE 1 TABLET AT BEDTIME FOR DEPRESSION  . NOVOLOG FLEXPEN 100 UNIT/ML FlexPen INJECT PER SLIDING SCALE BEFORE MEALS 201-250: 6UNITS, 251-300: 8U, 301-350: 10U, 351+ : CALL MD  . ONETOUCH VERIO test strip TEST TWICE DAILY  . tamsulosin (FLOMAX) 0.4 MG CAPS capsule TAKE 1 CAPSULE DAILY IN THE EVENING  . torsemide (DEMADEX) 20 MG  tablet TAKE 1 TABLET DAILY  . TRESIBA FLEXTOUCH 200 UNIT/ML SOPN INJECT 115 UNITS INTO THE SKIN DAILY  . TRESIBA FLEXTOUCH 200 UNIT/ML SOPN INJECT 115 UNITS INTO THE SKIN DAILY  . [DISCONTINUED] blood glucose meter kit and supplies KIT Dispense based on patient and insurance preference. Use up to four times daily as directed. (FOR ICD-9 250.00, 250.01).  . [DISCONTINUED] levETIRAcetam (KEPPRA) 100 MG/ML solution Take 10 mLs (1,000 mg total) by mouth 2 (two) times daily.   No facility-administered encounter medications on file as of 01/22/2017.     Past Medical History:  Diagnosis Date  . Acute ST elevation myocardial infarction (STEMI) involving left circumflex coronary artery ([place]) 05/07/2016   PCI to Cx-OM  . Anginal pain ([place])    secondary to sm. vessel disease  . Anxiety   . Arthritis    BIL KNEE PAIN AND BIL ANKLE PAIN  . Bone spur of ankle   . Cardiac arrest ([place]) 05/24/2016   with v fib  . Chronic combined systolic and diastolic CHF, NYHA class 2 and ACA/AHA stage C ([place]) 05/13/2016  . Coronary artery disease involving native coronary artery of native heart with angina pectoris ([place]) 05/24/2016   Remote MI at 51 years of age, Last cath 2010-diffuse non-obstructive disease; last echo 06/16/08 -normal LV function, moderate concentric hypertrophy; nuc 08/2008 no ischemia;  medical therapy; STEMI May 07 2016 - PCI to Cx-OM  . Diabetes mellitus    ON ORAL MEDICATION AND INSULIN  . Dilated cardiomyopathy ([place]) 05/24/2016   EF 325-30% by Echo post STEMI (previously 30-35%)   . Hyperlipidemia   . Hypertension   . Myocardial infarction ([place]) 1996   Post MI  . Peripheral vascular disease ([place])    HAS LEFT CAROTID ARTERY STENOSIS   AND IS S/P RIGHT CAROTID ENDARTERECTOMY 2010 Last carotid dopplers 01/08/2012 wth patent endarterectomy site  . Status post coronary artery stent placement     Past Surgical History:  Procedure Laterality Date  . APPENDECTOMY    . CARDIAC CATHETERIZATION      FEB 2010, significant branch vessel disease wth diag, marginal, PDA & PLA, nml. LV function  . CARDIAC CATHETERIZATION N/A 05/07/2016   Procedure: Left Heart Cath and Coronary Angiography;  Surgeon: Peter M Martinique, MD;  Location: [facility] CV LAB;  Service: Cardiovascular;  Laterality: N/A;  . CARDIAC CATHETERIZATION N/A 05/07/2016   Procedure: Coronary Stent Intervention;  Surgeon: Peter M Martinique, MD;  Location: [facility] CV LAB;  Service: Cardiovascular;  Laterality: N/A;  . CARDIAC CATHETERIZATION N/A 05/24/2016   Procedure: Left Heart Cath and Coronary Angiography;  Surgeon: [name], MD;  Location: [place] CV LAB;  Service: Cardiovascular;  Laterality: N/A;  . CARDIAC CATHETERIZATION N/A 05/24/2016   Procedure: Coronary Stent Intervention;  Surgeon: [name], MD;  Location: [facility] CV LAB;  Service: Cardiovascular;  Laterality: N/A;  2.5x20 Promus to Ostial/proximal circumflex  . CAROTID ENDARTERECTOMY  09/2008   Rt CEA  . ICD IMPLANT N/A 06/30/2016   Procedure: ICD Implant;  Surgeon: Will Meredith Leeds, MD;  Location: [facility] CV LAB;  Service: Cardiovascular;  Laterality: N/A;  . LUNG BIOPSY  2013   Bx's suggest granulomatous dz.  . RT ANKLE   2013    Social History   Social History  . Marital status: Married    Spouse name: [name]  . Number of children: 3  . Years of education: N/A   Occupational History  . inspector for [employer]    Social History Main Topics  . Smoking status: Former Smoker    Packs/day: 2.00    Years: 22.00    Types: Cigarettes    Quit date: 10/23/2011  . Smokeless tobacco: Current User    Types: Snuff  . Alcohol use No  . Drug use: No  . Sexual activity: Not on file   Other Topics Concern  . Not on file   Social History Narrative   Pt lives with family in [place], [place].    Family Status  Relation Status  . Mother Alive  . MGM Deceased at age 85  . MGF Deceased at age 66  . PGF Deceased at age 61   . Brother Alive  .  PGM Deceased at age 37  . Father Deceased  . Child Alive    Review of Systems A complete 10 system ROS was obtained and was negative apart from what is mentioned.   Objective:   VITALS:   Vitals:   01/22/17 0806  BP: 112/76  Pulse: 85  SpO2: 99%  Weight: 225 lb (102.1 kg)  Height: 5' 10" (1.778 m)   Gen:  Appears stated age and in NAD. HEENT:  Normocephalic, atraumatic. The mucous membranes are moist. The superficial temporal arteries are without ropiness or tenderness. Cardiovascular: Regular rate and rhythm. Lungs: Clear to auscultation bilaterally. Neck: There are no carotid bruits noted bilaterally.  NEUROLOGICAL:  Orientation:  The patient is alert and oriented x 3.   Cranial nerves: There is good facial symmetry.  The is asymmetric blink.  The pupils are equal round and reactive to light bilaterally. Fundoscopic exam reveals clear disc margins bilaterally. Extraocular muscles are intact and visual fields are full to confrontational testing. Speech is dysphasic today but not dysarthric. Soft palate rises symmetrically and there is no tongue deviation. Hearing is intact to conversational tone. Tone: Tone is good throughout. Sensation: Sensation is intact to light touch touch throughout Coordination:  The patient has ataxia with HS on the right.  Mild ataxia with finger-nose-finger on the right.  No difficulty with hand opening and closing bilaterally.  No difficulty with heel taps are toe taps bilaterally. Motor: Strength is 5/5 in the bilateral upper and lower extremities, with the exception of shoulder abduction on the right and that is limited due to rotator cuff pain.  Shoulder shrug is equal bilaterally.  There is no pronator drift.  There are no fasciculations noted. DTR's: Deep tendon reflexes are 1/4 at the bilateral biceps, triceps, brachioradialis, patella and achilles.  Plantar responses are downgoing bilaterally. Gait and Station: The patient is able to arise out of  the chair without difficulty.  He walks slowly  down the hall.  He doesn't use a cane today.  MOVEMENT EXAM: Abnormal movements:  There is no myoclonus.  There is minimal tremor on the L hand when given a weight.    Lab Results  Component Value Date   HGBA1C 9.3 (H) 05/07/2016        Assessment/Plan:   1.  Abnormal movements  -Patient has several different types of movements.  He has myoclonus, which I no longer see nor does wife.  Pt thinks that he still has some of it.  I am going to try and decrease keppra.   For now we will decrease from 1054m bid to 500 mg bid.    -Pt will continue on klonopin 0.5 mg bid and can take 1/2 tablet prn.  That seems to help tremor.    -pt thinks that tremor is increasing and I am going to start primidone, 50 mg q hs.  Risks, benefits, side effects and alternative therapies were discussed.  The opportunity to ask questions was given and they were answered to the best of my ability.  The patient expressed understanding and willingness to follow the outlined treatment protocols.  2.  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 fib arrest.  It appears that there is also a lesion in left STN.  Talked about stroke signs and symptoms.   -He has gone through all of the PT/ST he can have right now.    -encouraged ACT and gave information on this.  He does live a long way away.  3.  Peripheral neuropathy  -The patient has clinical examination evidence of a diffuse peripheral neuropathy, which certainly can affect gait and balance.  We discussed safety associated with peripheral neuropathy.  We discussed balance therapy and the importance of ambulatory assistive device for balance assistance.  When necessary he is doing both of these.  He likely has neuropathy from his long-standing, uncontrolled diabetes.  4.  Follow up is anticipated in the next few months, sooner should new neurologic issues arise.  Much greater than 50% of this visit was  spent in counseling and coordinating care.  Total face to face time:  30 min    CC:  [name], JFransisca Kaufmann MD

## 2017-01-20 NOTE — Telephone Encounter (Signed)
Go ahead and call in refills 

## 2017-01-20 NOTE — Telephone Encounter (Signed)
Rx called to pharmacy

## 2017-01-22 ENCOUNTER — Encounter: Payer: Self-pay | Admitting: Neurology

## 2017-01-22 ENCOUNTER — Ambulatory Visit (INDEPENDENT_AMBULATORY_CARE_PROVIDER_SITE_OTHER): Payer: BC Managed Care – PPO | Admitting: Neurology

## 2017-01-22 VITALS — BP 112/76 | HR 85 | Ht 70.0 in | Wt 225.0 lb

## 2017-01-22 DIAGNOSIS — G253 Myoclonus: Secondary | ICD-10-CM | POA: Diagnosis not present

## 2017-01-22 DIAGNOSIS — E1142 Type 2 diabetes mellitus with diabetic polyneuropathy: Secondary | ICD-10-CM | POA: Diagnosis not present

## 2017-01-22 DIAGNOSIS — R251 Tremor, unspecified: Secondary | ICD-10-CM | POA: Diagnosis not present

## 2017-01-22 MED ORDER — LEVETIRACETAM 500 MG PO TABS: 500.0000 mg | ORAL_TABLET | Freq: Two times a day (BID) | ORAL | 5 refills | Status: DC

## 2017-01-22 MED ORDER — PRIMIDONE 50 MG PO TABS
50.0000 mg | ORAL_TABLET | Freq: Every day | ORAL | 5 refills | Status: DC
Start: 2017-01-22 — End: 2017-06-30

## 2017-01-22 NOTE — Patient Instructions (Signed)
Decrease keppra to 500 mg twice per day  Start primidone 50 mg - 1/2 tablet at night for 4 nights and then increase to 1 tablet at night  Make a follow up in 3 months

## 2017-01-29 DIAGNOSIS — Z0279 Encounter for issue of other medical certificate: Secondary | ICD-10-CM | POA: Diagnosis not present

## 2017-02-02 ENCOUNTER — Other Ambulatory Visit: Payer: Self-pay | Admitting: Family Medicine

## 2017-02-03 ENCOUNTER — Telehealth: Payer: Self-pay | Admitting: Neurology

## 2017-02-03 NOTE — Telephone Encounter (Signed)
Is this something we would do? Not sure if this would come from Cardiology, please advise.

## 2017-02-03 NOTE — Telephone Encounter (Signed)
Left message on machine for patient to call back.

## 2017-02-03 NOTE — Telephone Encounter (Signed)
Patient's wife called needing to see if Dr. Arbutus Leas could write a Letter and  Send it to the  Social Security disability office regarding his current situation and not being able to work right now due to his stroke? Please Call. Thanks

## 2017-02-03 NOTE — Telephone Encounter (Signed)
They usually contact us with needed information or with request of records.  Would agree with inability to work

## 2017-02-04 NOTE — Telephone Encounter (Signed)
LMOM letting patient's wife know that she can contact them to send paperwork or record request and provided our fax number. She is to call with any questions.

## 2017-02-06 ENCOUNTER — Other Ambulatory Visit: Payer: Self-pay

## 2017-02-06 MED ORDER — INSULIN DEGLUDEC 200 UNIT/ML ~~LOC~~ SOPN: 115.0000 [IU] | PEN_INJECTOR | Freq: Every day | SUBCUTANEOUS | 1 refills | Status: DC

## 2017-02-27 ENCOUNTER — Other Ambulatory Visit: Payer: Self-pay | Admitting: *Deleted

## 2017-02-27 MED ORDER — TAMSULOSIN HCL 0.4 MG PO CAPS: ORAL_CAPSULE | ORAL | 0 refills | Status: DC

## 2017-02-27 MED ORDER — METFORMIN HCL 500 MG PO TABS: 500.0000 mg | ORAL_TABLET | Freq: Two times a day (BID) | ORAL | 0 refills | Status: DC

## 2017-02-27 MED ORDER — TORSEMIDE 20 MG PO TABS: 20.0000 mg | ORAL_TABLET | Freq: Every day | ORAL | 0 refills | Status: DC

## 2017-02-27 MED ORDER — MIRTAZAPINE 7.5 MG PO TABS: ORAL_TABLET | ORAL | 0 refills | Status: DC

## 2017-02-27 NOTE — Addendum Note (Signed)
Addended by: Julious PayerHOLT, CATHERINE D on: 02/27/2017 10:49 AM   Modules accepted: Orders

## 2017-02-27 NOTE — Addendum Note (Signed)
Addended by: Julious PayerHOLT, Ora Mcnatt D on: 02/27/2017 11:01 AM   Modules accepted: Orders

## 2017-02-27 NOTE — Addendum Note (Signed)
Addended by: Julious PayerHOLT, Otis Portal D on: 02/27/2017 03:33 PM   Modules accepted: Orders

## 2017-03-16 ENCOUNTER — Other Ambulatory Visit: Payer: Self-pay | Admitting: Family Medicine

## 2017-03-16 DIAGNOSIS — F32A Depression, unspecified: Secondary | ICD-10-CM

## 2017-03-16 DIAGNOSIS — F329 Major depressive disorder, single episode, unspecified: Secondary | ICD-10-CM

## 2017-03-16 DIAGNOSIS — F419 Anxiety disorder, unspecified: Secondary | ICD-10-CM

## 2017-03-25 ENCOUNTER — Other Ambulatory Visit: Payer: Self-pay | Admitting: Family Medicine

## 2017-03-25 NOTE — Telephone Encounter (Signed)
Go ahead and call in refill 

## 2017-03-25 NOTE — Telephone Encounter (Signed)
Rx phoned in.   

## 2017-03-25 NOTE — Telephone Encounter (Signed)
Last seen 12/22/2016. Last filled 02/23/17. If approved please route to pool and have nurse call into pharmacy

## 2017-04-06 ENCOUNTER — Other Ambulatory Visit: Payer: Self-pay | Admitting: Family Medicine

## 2017-04-14 ENCOUNTER — Ambulatory Visit (INDEPENDENT_AMBULATORY_CARE_PROVIDER_SITE_OTHER): Payer: BC Managed Care – PPO | Admitting: *Deleted

## 2017-04-14 DIAGNOSIS — I255 Ischemic cardiomyopathy: Secondary | ICD-10-CM | POA: Diagnosis not present

## 2017-04-14 NOTE — Progress Notes (Signed)
Remote ICD transmission.   

## 2017-04-16 LAB — CUP PACEART REMOTE DEVICE CHECK
Date Time Interrogation Session: 20181211093825
HIGH POWER IMPEDANCE MEASURED VALUE: 69 Ohm
Implantable Lead Implant Date: 20180226
Implantable Pulse Generator Implant Date: 20180226
Lead Channel Impedance Value: 304 Ohm
Lead Channel Pacing Threshold Amplitude: 1.125 V
Lead Channel Pacing Threshold Pulse Width: 0.4 ms
Lead Channel Sensing Intrinsic Amplitude: 15.125 mV
Lead Channel Sensing Intrinsic Amplitude: 15.125 mV
MDC IDC LEAD LOCATION: 753860
MDC IDC MSMT BATTERY REMAINING LONGEVITY: 134 mo
MDC IDC MSMT BATTERY VOLTAGE: 3.08 V
MDC IDC MSMT LEADCHNL RV IMPEDANCE VALUE: 361 Ohm
MDC IDC SET LEADCHNL RV PACING AMPLITUDE: 2.5 V
MDC IDC SET LEADCHNL RV PACING PULSEWIDTH: 0.4 ms
MDC IDC SET LEADCHNL RV SENSING SENSITIVITY: 0.3 mV
MDC IDC STAT BRADY RV PERCENT PACED: 0.01 %

## 2017-04-17 ENCOUNTER — Encounter: Payer: Self-pay | Admitting: Cardiology

## 2017-04-19 ENCOUNTER — Other Ambulatory Visit: Payer: Self-pay | Admitting: Family Medicine

## 2017-04-19 DIAGNOSIS — F329 Major depressive disorder, single episode, unspecified: Secondary | ICD-10-CM

## 2017-04-19 DIAGNOSIS — F32A Depression, unspecified: Secondary | ICD-10-CM

## 2017-04-19 DIAGNOSIS — F419 Anxiety disorder, unspecified: Secondary | ICD-10-CM

## 2017-04-20 NOTE — Telephone Encounter (Signed)
Last seen 12/2016  Dr Algis Downs

## 2017-04-23 DIAGNOSIS — Z0279 Encounter for issue of other medical certificate: Secondary | ICD-10-CM | POA: Diagnosis not present

## 2017-04-24 NOTE — Progress Notes (Deleted)
Subjective:   Douglas Carr was seen in consultation in the movement disorder clinic at the request of [name], [name], MD.  Multiple hospital notes have been reviewed.  Pt accompanied by wife who supplements the history.  The patient was admitted for a lengthy hospital stay for V. fib arrest in January, 2018.  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  Wife and patient report that this movement is better.  The patient had a defibrillator placed in February, 2018.  Following the defibrillator placement, the patient reported that his tremor, which he had at baseline, got much worse.   Wife and patient state that he really didn't have tremor at baseline.  He was evaluated by neurology and it was felt that this could be either an unmasking of his baseline tremor or worsening of the myoclonus.  It was recommended that he stay on Keppra and add clonazepam.  He takes it twice per day.  It works but if he is stressed out he will note tremor.  If he has tremor now, it is whole body but legs more than arms.  No preference for one side of body.  More at night than during the day.  It may last for several hours and then will go away.  No family hx of tremor.    Current/Previously tried tremor medications: n/a  Current medications that may exacerbate tremor:  n/a  Outside reports reviewed: historical medical records, radiology reports and referral letter/letters.MRI brain done 06/02/16 and demonstrated L thalamic infarct (acute).  I did review this personally.  01/22/17 update:  Pt seen in follow up.  On keppra for post anoxic myoclonus and doing well in that regard.   Wife does not notice any jerking but patient states that he still has some.   He also has tremor for which he is on klonopin. Tremor is coming and going but seems to be a bit worse.   This got worse after defib placed in 06/2016.  He is on klonopin 0.5 mg bid and he was told  he could take an extra 1/2 tablet prn.  The records that were made available to me were reviewed.  Did not go back to work, which resulted in him having to apply for disability.  Some depression associated with inability to work.  No SI/HI.  Started on cymbalta by PCP.  Recently fell in the house.  Just lost his balance and hit back against door jam.  Last PT was 2 months ago.  Last ST was about 6 months ago.  05/06/17 update: Patient seen in follow-up today.  He is accompanied by his wife who supplements the history.  Last visit, I decreased his Keppra to 500 mg twice daily.  He states that ***.  He remains on clonazepam 0.5 mg twice per day and we added primidone last visit for tremor, 50 mg at night.  No Known Allergies  Outpatient Encounter Medications as of 05/06/2017  Medication Sig  . acetaminophen (TYLENOL) 325 MG tablet Take 2 tablets (650 mg total) by mouth every 4 (four) hours as needed for headache or mild pain.  Marland Kitchen aspirin 81 MG chewable tablet Chew 1 tablet (81 mg total) by mouth daily.  Marland Kitchen atorvastatin (LIPITOR) 80 MG tablet TAKE 1 TABLET BY MOUTH DAILY  . baclofen (LIORESAL) 10 MG tablet Take 1 tablet (10 mg total) by mouth 3 (three) times daily.  . carvedilol (COREG) 12.5 MG  tablet Take 1 tablet (12.5 mg total) by mouth 2 (two) times daily.  . clonazePAM (KLONOPIN) 0.5 MG tablet TAKE 1 TABLET BY MOUTH TWICE A DAY  . clopidogrel (PLAVIX) 75 MG tablet TAKE 1 TABLET BY MOUTH EVERY DAY IN THE EVENING  . DULoxetine (CYMBALTA) 30 MG capsule TAKE 1 CAPSULE BY MOUTH EVERY DAY  . Insulin Degludec (TRESIBA FLEXTOUCH) 200 UNIT/ML SOPN Inject 116 Units into the skin daily.  Marland Kitchen. KLOR-CON M20 20 MEQ tablet TAKE 1 TABLET DAILY  . levETIRAcetam (KEPPRA) 1000 MG tablet TAKE 1 TABLET BY MOUTH TWICE A DAY  . levETIRAcetam (KEPPRA) 500 MG tablet Take 1 tablet (500 mg total) by mouth 2 (two) times daily.  Marland Kitchen. lisinopril (PRINIVIL,ZESTRIL) 2.5 MG tablet TAKE 1 TABLET BY MOUTH EVERY DAY  . meloxicam (MOBIC) 15  MG tablet TAKE 1 TABLET BY MOUTH EVERY DAY  . metFORMIN (GLUCOPHAGE) 500 MG tablet Take 1 tablet (500 mg total) by mouth 2 (two) times daily.  . mirtazapine (REMERON) 7.5 MG tablet TAKE 1 TABLET AT BEDTIME FOR DEPRESSION  . NOVOLOG FLEXPEN 100 UNIT/ML FlexPen INJECT PER SLIDING SCALE BEFORE MEALS 201-250: 6UNITS, 251-300: 8U, 301-350: 10U, 351+ : CALL MD  . ONETOUCH VERIO test strip TEST TWICE DAILY  . primidone (MYSOLINE) 50 MG tablet Take 1 tablet (50 mg total) by mouth at bedtime.  . tamsulosin (FLOMAX) 0.4 MG CAPS capsule TAKE 1 CAPSULE DAILY IN THE EVENING  . torsemide (DEMADEX) 20 MG tablet Take 1 tablet (20 mg total) by mouth daily.  . TRESIBA FLEXTOUCH 200 UNIT/ML SOPN INJECT 115 UNITS INTO THE SKIN DAILY   No facility-administered encounter medications on file as of 05/06/2017.     Past Medical History:  Diagnosis Date  . Acute ST elevation myocardial infarction (STEMI) involving left circumflex coronary artery (HCC) 05/07/2016   PCI to Cx-OM  . Anginal pain (HCC)    secondary to sm. vessel disease  . Anxiety   . Arthritis    BIL KNEE PAIN AND BIL ANKLE PAIN  . Bone spur of ankle   . Cardiac arrest (HCC) 05/24/2016   with v fib  . 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 Coronary artery disease involving native coronary artery of native heart with angina pectoris (HCC) 05/24/2016   Remote MI at 51 years of age, Last cath 2010-diffuse non-obstructive disease; last echo 06/16/08 -normal LV function, moderate concentric hypertrophy; nuc 08/2008 no ischemia;  medical therapy; STEMI May 07 2016 - PCI to Cx-OM  . Diabetes mellitus    ON ORAL MEDICATION AND INSULIN  . Dilated cardiomyopathy (HCC) 05/24/2016   EF 325-30% by Echo post STEMI (previously 30-35%)   . Hyperlipidemia   . Hypertension   . Myocardial infarction (HCC) 1996   Post MI  . Peripheral vascular disease (HCC)    HAS LEFT CAROTID ARTERY STENOSIS   AND IS S/P  RIGHT CAROTID ENDARTERECTOMY 2010 Last carotid dopplers 01/08/2012 wth patent endarterectomy site  . Status post coronary artery stent placement     Past Surgical History:  Procedure Laterality Date  . APPENDECTOMY    . CARDIAC CATHETERIZATION     FEB 2010, significant branch vessel disease wth diag, marginal, PDA & PLA, nml. LV function  . CARDIAC CATHETERIZATION N/A 05/07/2016   Procedure: Left Heart Cath and Coronary Angiography;  Surgeon: Peter M SwazilandJordan, MD;  [facility];  Service: Cardiovascular;  Laterality: N/A;  . CARDIAC CATHETERIZATION  N/A 05/07/2016   Procedure: Coronary Stent Intervention;  Surgeon: Peter M SwazilandJordan, MD;  [facility];  Service: Cardiovascular;  Laterality: N/A;  . CARDIAC CATHETERIZATION N/A 05/24/2016   Procedure: Left Heart Cath and Coronary Angiography;  Surgeon: Marykay Lexavid W Harding, MD;  [facility];  Service: Cardiovascular;  Laterality: N/A;  . CARDIAC CATHETERIZATION N/A 05/24/2016   Procedure: Coronary Stent Intervention;  Surgeon: Marykay Lexavid W Harding, MD;  [facility];  Service: Cardiovascular;  Laterality: N/A;  2.5x20 Promus to Ostial/proximal circumflex  . CAROTID ENDARTERECTOMY  09/2008   Rt CEA  . ICD IMPLANT N/A 06/30/2016   Procedure: ICD Implant;  Surgeon: Will Jorja LoaMartin Camnitz, MD;  Location: [facility] CV LAB;  Service: Cardiovascular;  Laterality: N/A;  . LUNG BIOPSY  2013   Bx's suggest granulomatous dz.  . RT ANKLE   2013    Social History   Socioeconomic History  . Marital status: Married    Spouse name: [name]  . Number of children: 3  . Years of education: Not on file  . Highest education level: Not on file  Social Needs  . Financial resource strain: Not on file  . Food insecurity - worry: Not on file  . Food insecurity - inability: Not on file  . Transportation needs - medical: Not on file  . Transportation needs - non-medical: Not on file  Occupational History  . Occupation:  Midwifeinspector for [employer]    Employer: [employer]   Tobacco Use  . Smoking status: Former Smoker    Packs/day: 2.00    Years: 22.00    Pack years: 44.00    Types: Cigarettes    Last attempt to quit: 10/23/2011    Years since quitting: 5.5  . Smokeless tobacco: Current User    Types: Snuff  Substance and Sexual Activity  . Alcohol use: No  . Drug use: No  . Sexual activity: Not on file  Other Topics Concern  . Not on file  Social History Narrative   Pt lives with family in [place], [place].    Family Status  Relation Name Status  . Mother  Alive  . MGM  Deceased at age 50  . MGF  Deceased at age 50  . PGF  Deceased at age 50   . Brother  Alive  . PGM  Deceased at age 50  . Father  Deceased  . Child x3 Alive    Review of Systems A complete 10 system ROS was obtained and was negative apart from what is mentioned.   Objective:   VITALS:   There were no vitals filed for this visit. Gen:  Appears stated age and in NAD. HEENT:  Normocephalic, atraumatic. The mucous membranes are moist. The superficial temporal arteries are without ropiness or tenderness. Cardiovascular: Regular rate and rhythm. Lungs: Clear to auscultation bilaterally. Neck: There are no carotid bruits noted bilaterally.  NEUROLOGICAL:  Orientation:  The patient is alert and oriented x 3.   Cranial nerves: There is good facial symmetry.  The is asymmetric blink.  The pupils are equal round and reactive to light bilaterally. Fundoscopic exam reveals clear disc margins bilaterally. Extraocular muscles are intact and visual fields are full to confrontational testing. Speech is dysphasic today but not dysarthric. Soft palate rises symmetrically and there is no tongue deviation. Hearing is intact to conversational tone. Tone: Tone is good throughout. Sensation: Sensation is intact to light touch touch throughout Coordination:  The patient  has ataxia with HS on the right.  Mild ataxia with finger-nose-finger on the right.  No  difficulty with hand opening and closing bilaterally.  No difficulty with heel taps are toe taps bilaterally. Motor: Strength is 5/5 in the bilateral upper and lower extremities, with the exception of shoulder abduction on the right and that is limited due to rotator cuff pain.  Shoulder shrug is equal bilaterally.  There is no pronator drift.  There are no fasciculations noted. DTR's: Deep tendon reflexes are 1/4 at the bilateral biceps, triceps, brachioradialis, patella and achilles.  Plantar responses are downgoing bilaterally. Gait and Station: The patient is able to arise out of the chair without difficulty.  He walks slowly down the hall.  He doesn't use a cane today.  MOVEMENT EXAM: Abnormal movements:  There is no myoclonus.  There is minimal tremor on the L hand when given a weight.    Lab Results  Component Value Date   HGBA1C 9.3 (H) 05/07/2016        Assessment/Plan:   1.  Abnormal movements  -Patient has several different types of movements.  He has myoclonus, which I no longer see nor does wife.  Pt thinks that he still has some of it.  I am going to try and decrease keppra.   For now we will decrease from 1000mg  bid to 500 mg bid.    -Pt will continue on klonopin 0.5 mg bid and can take 1/2 tablet prn.  That seems to help tremor.    -pt thinks that tremor is increasing and I am going to start primidone, 50 mg q hs.  Risks, benefits, side effects and alternative therapies were discussed.  The opportunity to ask questions was given and they were answered to the best of my ability.  The patient expressed understanding and willingness to follow the outlined treatment protocols.  2.  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 fib arrest.  It appears that there is also a lesion in left STN.  Talked about stroke signs and symptoms.   -He has gone through all of the PT/ST he can have right now.    -encouraged ACT and gave information on this.  He does live a  long way away.  3.  Peripheral neuropathy  -The patient has clinical examination evidence of a diffuse peripheral neuropathy, which certainly can affect gait and balance.  We discussed safety associated with peripheral neuropathy.  We discussed balance therapy and the importance of ambulatory assistive device for balance assistance.  When necessary he is doing both of these.  He likely has neuropathy from his long-standing, uncontrolled diabetes.  4.  Follow up is anticipated in the next few months, sooner should new neurologic issues arise.  Much greater than 50% of this visit was spent in counseling and coordinating care.  Total face to face time:  30 min    CC:  [name], [name], MD

## 2017-04-27 ENCOUNTER — Ambulatory Visit: Payer: BC Managed Care – PPO | Admitting: Neurology

## 2017-05-02 ENCOUNTER — Ambulatory Visit: Payer: BC Managed Care – PPO | Admitting: Physician Assistant

## 2017-05-06 ENCOUNTER — Ambulatory Visit: Payer: BC Managed Care – PPO | Admitting: Neurology

## 2017-05-20 ENCOUNTER — Emergency Department (HOSPITAL_COMMUNITY): Payer: BC Managed Care – PPO

## 2017-05-20 ENCOUNTER — Other Ambulatory Visit: Payer: Self-pay

## 2017-05-20 ENCOUNTER — Emergency Department (HOSPITAL_COMMUNITY)
Admission: EM | Admit: 2017-05-20 | Discharge: 2017-05-20 | Disposition: A | Discharge status: Home / Self Care | Payer: BC Managed Care – PPO | Attending: Emergency Medicine | Admitting: Emergency Medicine

## 2017-05-20 ENCOUNTER — Encounter (HOSPITAL_COMMUNITY): Payer: Self-pay | Admitting: *Deleted

## 2017-05-20 DIAGNOSIS — Z7982 Long term (current) use of aspirin: Secondary | ICD-10-CM | POA: Diagnosis not present

## 2017-05-20 DIAGNOSIS — I2510 Atherosclerotic heart disease of native coronary artery without angina pectoris: Secondary | ICD-10-CM | POA: Diagnosis not present

## 2017-05-20 DIAGNOSIS — R55 Syncope and collapse: Secondary | ICD-10-CM | POA: Diagnosis not present

## 2017-05-20 DIAGNOSIS — I110 Hypertensive heart disease with heart failure: Secondary | ICD-10-CM | POA: Insufficient documentation

## 2017-05-20 DIAGNOSIS — R410 Disorientation, unspecified: Secondary | ICD-10-CM | POA: Diagnosis not present

## 2017-05-20 DIAGNOSIS — Z87891 Personal history of nicotine dependence: Secondary | ICD-10-CM | POA: Diagnosis not present

## 2017-05-20 DIAGNOSIS — E119 Type 2 diabetes mellitus without complications: Secondary | ICD-10-CM | POA: Diagnosis not present

## 2017-05-20 DIAGNOSIS — I252 Old myocardial infarction: Secondary | ICD-10-CM | POA: Insufficient documentation

## 2017-05-20 DIAGNOSIS — Z7984 Long term (current) use of oral hypoglycemic drugs: Secondary | ICD-10-CM | POA: Insufficient documentation

## 2017-05-20 DIAGNOSIS — Y929 Unspecified place or not applicable: Secondary | ICD-10-CM | POA: Diagnosis not present

## 2017-05-20 DIAGNOSIS — Y999 Unspecified external cause status: Secondary | ICD-10-CM | POA: Diagnosis not present

## 2017-05-20 DIAGNOSIS — Z7902 Long term (current) use of antithrombotics/antiplatelets: Secondary | ICD-10-CM | POA: Insufficient documentation

## 2017-05-20 DIAGNOSIS — I5042 Chronic combined systolic (congestive) and diastolic (congestive) heart failure: Secondary | ICD-10-CM | POA: Diagnosis not present

## 2017-05-20 DIAGNOSIS — R531 Weakness: Secondary | ICD-10-CM

## 2017-05-20 DIAGNOSIS — Z79899 Other long term (current) drug therapy: Secondary | ICD-10-CM | POA: Insufficient documentation

## 2017-05-20 DIAGNOSIS — W07XXXA Fall from chair, initial encounter: Secondary | ICD-10-CM | POA: Insufficient documentation

## 2017-05-20 DIAGNOSIS — Y939 Activity, unspecified: Secondary | ICD-10-CM | POA: Insufficient documentation

## 2017-05-20 DIAGNOSIS — W19XXXA Unspecified fall, initial encounter: Secondary | ICD-10-CM

## 2017-05-20 LAB — BASIC METABOLIC PANEL
Anion gap: 12 (ref 5–15)
BUN: 20 mg/dL (ref 6–20)
CHLORIDE: 101 mmol/L (ref 101–111)
CO2: 25 mmol/L (ref 22–32)
Calcium: 9.1 mg/dL (ref 8.9–10.3)
Creatinine, Ser: 1.22 mg/dL (ref 0.61–1.24)
GFR calc Af Amer: >60 mL/min (ref >60)
GFR calc non Af Amer: >60 mL/min (ref >60)
GLUCOSE: 240 mg/dL — AB (ref 65–99)
POTASSIUM: 4.5 mmol/L (ref 3.5–5.1)
Sodium: 138 mmol/L (ref 135–145)

## 2017-05-20 LAB — I-STAT CHEM 8, ED
BUN: 23 mg/dL — AB (ref 6–20)
CREATININE: 1.2 mg/dL (ref 0.61–1.24)
Calcium, Ion: 1.22 mmol/L (ref 1.15–1.40)
Chloride: 99 mmol/L — ABNORMAL LOW (ref 101–111)
GLUCOSE: 236 mg/dL — AB (ref 65–99)
HCT: 40 % (ref 39.0–52.0)
Hemoglobin: 13.6 g/dL (ref 13.0–17.0)
POTASSIUM: 4.5 mmol/L (ref 3.5–5.1)
Sodium: 139 mmol/L (ref 135–145)
TCO2: 30 mmol/L (ref 22–32)

## 2017-05-20 LAB — DIFFERENTIAL
BASOS ABS: 0 10*3/uL (ref 0.0–0.1)
BASOS PCT: 0 %
Eosinophils Absolute: 0.1 10*3/uL (ref 0.0–0.7)
Eosinophils Relative: 2 %
LYMPHS PCT: 32 %
Lymphs Abs: 2.8 10*3/uL (ref 0.7–4.0)
Monocytes Absolute: 0.6 10*3/uL (ref 0.1–1.0)
Monocytes Relative: 7 %
NEUTROS PCT: 59 %
Neutro Abs: 5.2 10*3/uL (ref 1.7–7.7)

## 2017-05-20 LAB — URINALYSIS, ROUTINE W REFLEX MICROSCOPIC
BACTERIA UA: NONE SEEN
BILIRUBIN URINE: NEGATIVE
Glucose, UA: 50 mg/dL — AB
HGB URINE DIPSTICK: NEGATIVE
KETONES UR: NEGATIVE mg/dL
LEUKOCYTES UA: NEGATIVE
Nitrite: NEGATIVE
PROTEIN: 30 mg/dL — AB
Specific Gravity, Urine: 1.017 (ref 1.005–1.030)
pH: 5 (ref 5.0–8.0)

## 2017-05-20 LAB — COMPREHENSIVE METABOLIC PANEL
ALBUMIN: 3.7 g/dL (ref 3.5–5.0)
ALK PHOS: 69 U/L (ref 38–126)
ALT: 33 U/L (ref 17–63)
AST: 23 U/L (ref 15–41)
Anion gap: 11 (ref 5–15)
BUN: 21 mg/dL — ABNORMAL HIGH (ref 6–20)
CALCIUM: 9 mg/dL (ref 8.9–10.3)
CHLORIDE: 102 mmol/L (ref 101–111)
CO2: 25 mmol/L (ref 22–32)
CREATININE: 1.24 mg/dL (ref 0.61–1.24)
GFR calc Af Amer: >60 mL/min (ref >60)
GFR calc non Af Amer: >60 mL/min (ref >60)
GLUCOSE: 203 mg/dL — AB (ref 65–99)
Potassium: 4.2 mmol/L (ref 3.5–5.1)
SODIUM: 138 mmol/L (ref 135–145)
Total Bilirubin: 0.5 mg/dL (ref 0.3–1.2)
Total Protein: 6.5 g/dL (ref 6.5–8.1)

## 2017-05-20 LAB — I-STAT CG4 LACTIC ACID, ED
LACTIC ACID, VENOUS: 2.51 mmol/L — AB (ref 0.5–1.9)
LACTIC ACID, VENOUS: 1.25 mmol/L (ref 0.5–1.9)

## 2017-05-20 LAB — I-STAT TROPONIN, ED: Troponin i, poc: 0.02 ng/mL (ref 0.00–0.08)

## 2017-05-20 LAB — APTT: APTT: 26 s (ref 24–36)

## 2017-05-20 LAB — CBC
HEMATOCRIT: 41.4 % (ref 39.0–52.0)
Hemoglobin: 13.8 g/dL (ref 13.0–17.0)
MCH: 28.3 pg (ref 26.0–34.0)
MCHC: 33.3 g/dL (ref 30.0–36.0)
MCV: 85 fL (ref 78.0–100.0)
Platelets: 157 10*3/uL (ref 150–400)
RBC: 4.87 MIL/uL (ref 4.22–5.81)
RDW: 12.6 % (ref 11.5–15.5)
WBC: 9.2 10*3/uL (ref 4.0–10.5)

## 2017-05-20 LAB — RAPID URINE DRUG SCREEN, HOSP PERFORMED
Amphetamines: NOT DETECTED
BARBITURATES: POSITIVE — AB
BENZODIAZEPINES: POSITIVE — AB
Cocaine: NOT DETECTED
Opiates: POSITIVE — AB
TETRAHYDROCANNABINOL: NOT DETECTED

## 2017-05-20 LAB — PROTIME-INR
INR: 1.01
Prothrombin Time: 13.2 seconds (ref 11.4–15.2)

## 2017-05-20 LAB — CBG MONITORING, ED: GLUCOSE-CAPILLARY: 212 mg/dL — AB (ref 65–99)

## 2017-05-20 LAB — ETHANOL: Alcohol, Ethyl (B): <10 mg/dL (ref <10)

## 2017-05-20 MED ORDER — SODIUM CHLORIDE 0.9 % IV SOLN
INTRAVENOUS | Status: DC
  Administered 2017-05-20: 10:00:00 via INTRAVENOUS

## 2017-05-20 MED ORDER — SODIUM CHLORIDE 0.9 % IV BOLUS (SEPSIS)
500.0000 mL | Freq: Once | INTRAVENOUS | Status: AC
  Administered 2017-05-20: 500 mL via INTRAVENOUS

## 2017-05-20 NOTE — ED Notes (Signed)
Pt transported to MRI with SW[employer] nurse

## 2017-05-20 NOTE — ED Provider Notes (Signed)
Pt seen by Dr Deretha EmoryZackowski.  Please see his note.   Pt is pending MRI.   If MRI is negative, plan is for discharge, outpatient follow up .  MRI was negative.   Discharged in stable condition   Linwood DibblesKnapp, Shellsea Borunda, MD 05/22/17 1113

## 2017-05-20 NOTE — ED Notes (Signed)
Patient transported to MRI 

## 2017-05-20 NOTE — ED Notes (Signed)
Pt stas hwe undedrstands instructions. Home stabled via wc with wife.

## 2017-05-20 NOTE — ED Triage Notes (Addendum)
Pt reports out of his chair last night and this am. Hx of stroke and slurred speech is noted at triage. Pt states no increase in slurred speech or increase in unilateral weakness. Grips are equal, no arm drift is noted at triage.

## 2017-05-20 NOTE — ED Notes (Signed)
Discussed low bp with pt and wife; slightly lowered head of bed.

## 2017-05-20 NOTE — Discharge Instructions (Signed)
Continue your current medications, follow-up with your primary care doctor °

## 2017-05-20 NOTE — ED Notes (Signed)
Patient transported to CT 

## 2017-05-20 NOTE — ED Notes (Signed)
[employer] pacer/defib completed.

## 2017-05-20 NOTE — ED Provider Notes (Signed)
[facility] EMERGENCY DEPARTMENT Provider Note   CSN: [id number] Arrival date & time: 05/20/17  0848     History   Chief Complaint Chief Complaint  Patient presents with  . Fall  . Weakness    HPI Douglas Carr is a 51 y.o. male.  Patient brought in by family for near syncopal episode and concern about possible recurrent stroke.  Patient had a significant MI in the past has a defibrillator, pacemaker.  Also has cardiac stents.  Supposedly associated with the MI was as well as stroke.  Patient has had slurred speech since that time.  Does not have any focal weakness and normally gets around pretty well but this morning at the table fell out of his chair did not pass out but was confused and very groggy.  Almost passed out seem to have generalized weakness nothing focal.  Based on his past history patient was evaluated for possible code stroke but based on the timing also apparently has this been going on since last evening.  So did not meet criteria for code stroke.  But did undergo a possible stroke workup.      Past Medical History:  Diagnosis Date  . Acute ST elevation myocardial infarction (STEMI) involving left circumflex coronary artery (HCC) 05/07/2016   PCI to Cx-OM  . Anginal pain (HCC)    secondary to sm. vessel disease  . Anxiety   . Arthritis    BIL KNEE PAIN AND BIL ANKLE PAIN  . Bone spur of ankle   . Cardiac arrest (HCC) 05/24/2016   with v fib  . 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 Coronary artery disease involving native coronary artery of native heart with angina pectoris (HCC) 05/24/2016   Remote MI at 52 years of age, Last cath 2010-diffuse non-obstructive disease; last echo 06/16/08 -normal LV function, moderate concentric hypertrophy; nuc 08/2008 no ischemia;  medical therapy; STEMI May 07 2016 - PCI to Cx-OM  . Diabetes mellitus    ON ORAL MEDICATION AND INSULIN  . Dilated  cardiomyopathy (HCC) 05/24/2016   EF 325-30% by Echo post STEMI (previously 30-35%)   . Hyperlipidemia   . Hypertension   . Myocardial infarction (HCC) 1996   Post MI  . Peripheral vascular disease (HCC)    HAS LEFT CAROTID ARTERY STENOSIS   AND IS S/P RIGHT CAROTID ENDARTERECTOMY 2010 Last carotid dopplers 01/08/2012 wth patent endarterectomy site  . Status post coronary artery stent placement     Patient Active Problem List   Diagnosis Date Noted  . Anxiety and depression 11/19/2016  . Ischemic cardiomyopathy 06/30/2016  . Diabetes mellitus with complication (HCC)   . Diabetes mellitus type 2 in obese (HCC)   . Acute blood loss anemia   . Myoclonus   . Anoxic brain injury (HCC)   . Seizures (HCC)   . Dilated cardiomyopathy (HCC) 05/24/2016  . Coronary artery disease involving native coronary artery of native heart with angina pectoris (HCC) 05/24/2016  . 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 Status post coronary artery stent placement   . Essential hypertension 09/21/2013  . Pulmonary nodules 10/16/2011    Past Surgical History:  Procedure Laterality Date  . APPENDECTOMY    . CARDIAC CATHETERIZATION     FEB 2010, significant branch vessel disease wth diag, marginal, PDA & PLA, nml. LV function  . CARDIAC CATHETERIZATION  N/A 05/07/2016   Procedure: Left Heart Cath and Coronary Angiography;  Surgeon: Peter M Swaziland, MD;  Location: [facility] INVASIVE CV LAB;  Service: Cardiovascular;  Laterality: N/A;  . CARDIAC CATHETERIZATION N/A 05/07/2016   Procedure: Coronary Stent Intervention;  Surgeon: Peter M Swaziland, MD;  Location: [facility] INVASIVE CV LAB;  Service: Cardiovascular;  Laterality: N/A;  . CARDIAC CATHETERIZATION N/A 05/24/2016   Procedure: Left Heart Cath and Coronary Angiography;  Surgeon: Marykay Lex, MD;  Location: [facility] INVASIVE CV LAB;  Service: Cardiovascular;  Laterality: N/A;  . CARDIAC CATHETERIZATION N/A 05/24/2016    Procedure: Coronary Stent Intervention;  Surgeon: Marykay Lex, MD;  Location: [facility] INVASIVE CV LAB;  Service: Cardiovascular;  Laterality: N/A;  2.5x20 Promus to Ostial/proximal circumflex  . CAROTID ENDARTERECTOMY  09/2008   Rt CEA  . ICD IMPLANT N/A 06/30/2016   Procedure: ICD Implant;  Surgeon: Will Jorja Loa, MD;  Location: [facility] INVASIVE CV LAB;  Service: Cardiovascular;  Laterality: N/A;  . LUNG BIOPSY  2013   Bx's suggest granulomatous dz.  . RT ANKLE   2013       Home Medications    Prior to Admission medications   Medication Sig Start Date End Date Taking? Authorizing Provider  aspirin EC 81 MG tablet Take 81 mg by mouth daily.   Yes [provider]  atorvastatin (LIPITOR) 80 MG tablet TAKE 1 TABLET BY MOUTH DAILY 04/06/17  Yes [name], [name], MD  carvedilol (COREG) 12.5 MG tablet Take 1 tablet (12.5 mg total) by mouth 2 (two) times daily. 10/07/16 05/20/17 Yes Camnitz, Will Daphine Deutscher, MD  clonazePAM (KLONOPIN) 0.5 MG tablet TAKE 1 TABLET BY MOUTH TWICE A DAY 03/25/17  Yes [name], [name], MD  clopidogrel (PLAVIX) 75 MG tablet TAKE 1 TABLET BY MOUTH EVERY DAY IN THE EVENING 02/02/17  Yes [name], [name], MD  DULoxetine (CYMBALTA) 30 MG capsule TAKE 1 CAPSULE BY MOUTH EVERY DAY 04/20/17  Yes [name], [name], MD  ibuprofen (ADVIL,MOTRIN) 200 MG tablet Take 200 mg by mouth every 6 (six) hours as needed for moderate pain.   Yes [provider]  Insulin Degludec (TRESIBA FLEXTOUCH) 200 UNIT/ML SOPN Inject 116 Units into the skin daily. 02/06/17  Yes Stacks, Broadus John, MD  KLOR-CON M20 20 MEQ tablet TAKE 1 TABLET DAILY 09/03/16  Yes [name], [name], MD  levETIRAcetam (KEPPRA) 500 MG tablet Take 1 tablet (500 mg total) by mouth 2 (two) times daily. 01/22/17  Yes Tat, Octaviano Batty, DO  lisinopril (PRINIVIL,ZESTRIL) 2.5 MG tablet TAKE 1 TABLET BY MOUTH EVERY DAY 02/02/17  Yes [name], [name], MD  meloxicam (MOBIC) 15 MG tablet TAKE 1 TABLET BY MOUTH EVERY DAY  12/16/16  Yes [name], [name], MD  metFORMIN (GLUCOPHAGE) 500 MG tablet Take 1 tablet (500 mg total) by mouth 2 (two) times daily. 02/27/17  Yes [name], [name], MD  mirtazapine (REMERON) 7.5 MG tablet TAKE 1 TABLET AT BEDTIME FOR DEPRESSION 02/27/17  Yes [name], [name], MD  NOVOLOG FLEXPEN 100 UNIT/ML FlexPen INJECT PER SLIDING SCALE BEFORE MEALS 201-250: 6UNITS, 251-300: 8U, 301-350: 10U, 351+ : CALL MD 09/02/16  Yes Elenora Gamma, MD  primidone (MYSOLINE) 50 MG tablet Take 1 tablet (50 mg total) by mouth at bedtime. 01/22/17  Yes Tat, Octaviano Batty, DO  tamsulosin (FLOMAX) 0.4 MG CAPS capsule TAKE 1 CAPSULE DAILY IN THE EVENING 02/27/17  Yes [name], [name], MD  torsemide (DEMADEX) 20 MG tablet Take 1 tablet (20 mg total) by  mouth daily. 02/27/17  Yes [name], [name], MD  acetaminophen (TYLENOL) 325 MG tablet Take 2 tablets (650 mg total) by mouth every 4 (four) hours as needed for headache or mild pain. 05/11/16   Abelino Derrick, PA-C  aspirin 81 MG chewable tablet Chew 1 tablet (81 mg total) by mouth daily. Patient not taking: Reported on 05/20/2017 05/12/16   Abelino Derrick, PA-C  baclofen (LIORESAL) 10 MG tablet Take 1 tablet (10 mg total) by mouth 3 (three) times daily. Patient not taking: Reported on 05/20/2017 12/22/16   [name], [name], MD  levETIRAcetam (KEPPRA) 1000 MG tablet TAKE 1 TABLET BY MOUTH TWICE A DAY Patient not taking: Reported on 05/20/2017 02/02/17   [name], [name], MD  [facility] VERIO test strip TEST TWICE DAILY 12/11/16   Junie Spencer, FNP    Family History Family History  Problem Relation Age of Onset  . Hypertension Mother   . Diabetes Mother   . Hypertension Maternal Grandfather   . Heart attack Paternal Grandfather   . Heart attack Father 49    Social History Social History   Tobacco Use  . Smoking status: Former Smoker    Packs/day: 2.00    Years: 22.00    Pack years: 44.00    Types: Cigarettes    Last attempt to quit: 10/23/2011      Years since quitting: 5.5  . Smokeless tobacco: Current User    Types: Snuff  Substance Use Topics  . Alcohol use: No  . Drug use: No     Allergies   Patient has no known allergies.   Review of Systems Review of Systems  Constitutional: Positive for fatigue.  HENT: Negative for congestion.   Eyes: Negative for visual disturbance.  Respiratory: Negative for shortness of breath.   Cardiovascular: Negative for chest pain.  Gastrointestinal: Negative for abdominal pain.  Genitourinary: Negative for dysuria.  Musculoskeletal: Negative for back pain.  Skin: Negative for rash.  Neurological: Positive for speech difficulty and weakness. Negative for syncope.  Hematological: Does not bruise/bleed easily.  Psychiatric/Behavioral: Positive for confusion.     Physical Exam Updated Vital Signs BP 98/84   Pulse 83   Temp 98.3 F (36.8 C)   Resp 19   SpO2 99%   Physical Exam  Constitutional: He appears well-developed and well-nourished. No distress.  HENT:  Head: Normocephalic and atraumatic.  Mouth/Throat: Oropharynx is clear and moist.  Eyes: Conjunctivae and EOM are normal. Pupils are equal, round, and reactive to light.  Neck: Normal range of motion. Neck supple.  Cardiovascular: Normal rate, regular rhythm and normal heart sounds.  Pulmonary/Chest: Effort normal and breath sounds normal.  Abdominal: Soft. Bowel sounds are normal.  Musculoskeletal: Normal range of motion.  Neurological: He is alert. No cranial nerve deficit or sensory deficit. He exhibits normal muscle tone. Coordination normal.  Some generalized weakness.  Skin: Skin is warm.  Nursing note and vitals reviewed.    ED Treatments / Results  Labs (all labs ordered are listed, but only abnormal results are displayed) Labs Reviewed  BASIC METABOLIC PANEL - Abnormal; Notable for the following components:      Result Value   Glucose, Bld 240 (*)    All other components within normal limits   URINALYSIS, ROUTINE W REFLEX MICROSCOPIC - Abnormal; Notable for the following components:   APPearance HAZY (*)    Glucose, UA 50 (*)    Protein, ur 30 (*)    Squamous Epithelial / LPF 0-5 (*)  All other components within normal limits  COMPREHENSIVE METABOLIC PANEL - Abnormal; Notable for the following components:   Glucose, Bld 203 (*)    BUN 21 (*)    All other components within normal limits  RAPID URINE DRUG SCREEN, HOSP PERFORMED - Abnormal; Notable for the following components:   Opiates POSITIVE (*)    Benzodiazepines POSITIVE (*)    Barbiturates POSITIVE (*)    All other components within normal limits  CBG MONITORING, ED - Abnormal; Notable for the following components:   Glucose-Capillary 212 (*)    All other components within normal limits  I-STAT CG4 LACTIC ACID, ED - Abnormal; Notable for the following components:   Lactic Acid, Venous 2.51 (*)    All other components within normal limits  I-STAT CHEM 8, ED - Abnormal; Notable for the following components:   Chloride 99 (*)    BUN 23 (*)    Glucose, Bld 236 (*)    All other components within normal limits  CULTURE, BLOOD (ROUTINE X 2)  CULTURE, BLOOD (ROUTINE X 2)  CBC  ETHANOL  PROTIME-INR  APTT  DIFFERENTIAL  I-STAT TROPONIN, ED  I-STAT TROPONIN, ED  I-STAT CG4 LACTIC ACID, ED    EKG  EKG Interpretation  Date/Time:  Wednesday May 20 2017 09:09:56 EST Ventricular Rate:  78 PR Interval:  174 QRS Duration: 106 QT Interval:  386 QTC Calculation: 440 R Axis:   -54 Text Interpretation:  Normal sinus rhythm Left axis deviation Possible Anterior infarct , age undetermined Abnormal ECG Confirmed by Vanetta Mulders [id number]) on 05/20/2017 9:19:03 AM       Radiology Ct Head Wo Contrast  Result Date: 05/20/2017 CLINICAL DATA:  52 year old male status post fall from chair. Unexplained altered level of consciousness. Chronic right ICA occlusion. EXAM: CT HEAD WITHOUT CONTRAST TECHNIQUE: Contiguous  axial images were obtained from the base of the skull through the vertex without intravenous contrast. COMPARISON:  CTA head 06/09/2016.  Brain MRI 06/02/2016. FINDINGS: Brain: Scattered small chronic cortically based infarcts along the superior parietal lobes and superior right frontal lobe appear stable to the 06/02/2016 brain MRI. Stable supratentorial cerebral volume and gray-white matter differentiation, including another small chronic cortically based infarct at the lateral left parieto-occipital sulcus. Generalized cerebellar atrophy since February 2018 (series 2, image 10 today versus series 201, image 8 on 06/09/2016). Brainstem volume has not significantly changed. No midline shift, ventriculomegaly, mass effect, evidence of mass lesion, intracranial hemorrhage or evidence of cortically based acute infarction. Vascular: Mild Calcified atherosclerosis at the skull base. No suspicious intracranial vascular hyperdensity. Skull: No acute osseous abnormality identified. Sinuses/Orbits: Visualized paranasal sinuses and mastoids are stable and well pneumatized. Other: Mild generalized scalp soft tissue swelling and stranding compared to 2,018. Otherwise no acute orbit or scalp soft tissue findings. IMPRESSION: 1. The cerebellum has diffusely atrophied since January 2018. This finding is nonspecific, but might represent the sequelae of an unusual anoxic injury considering the history of cardiac arrest in January 2018. 2. No other acute intracranial abnormality identified. Scattered bilateral cerebral hemisphere small chronic cortical infarcts are stable. Electronically Signed   By: Odessa Fleming M.D.   On: 05/20/2017 10:30   Dg Chest Port 1 View  Result Date: 05/20/2017 CLINICAL DATA:  Fall out of chair last night. EXAM: PORTABLE CHEST 1 VIEW COMPARISON:  07/01/2016 and 01/06/2012 FINDINGS: Lungs are hypoinflated without focal airspace consolidation or effusion. Several dense right lung nodules likely granulomas and  unchanged. Cardiomediastinal silhouette is within normal. Subtle density  over the left midlung likely related to the anterior left third rib and in not underlying parenchymal disease. There may be subtle fracture involving the left anterior third rib. IMPRESSION: No acute cardiopulmonary disease. Possible subtle fracture involving the left anterior third rib. Recommend rib series for better evaluation. Electronically Signed   By: Elberta Fortisaniel  Boyle M.D.   On: 05/20/2017 10:25    Procedures Procedures (including critical care time)  Medications Ordered in ED Medications  0.9 %  sodium chloride infusion ( Intravenous New Bag/Given 05/20/17 0944)  sodium chloride 0.9 % bolus 500 mL (0 mLs Intravenous Stopped 05/20/17 1229)     Initial Impression / Assessment and Plan / ED Course  I have reviewed the triage vital signs and the nursing notes.  Pertinent labs & imaging results that were available during my care of the patient were reviewed by me and considered in my medical decision making (see chart for details).     Patient has shown improvement during his time here.  Now more alert speaking better.  Patient has residual speech problems secondary to his stroke.  But what happened this morning at home was concerning.  Patient was at the table at home fell out of the chair.  Some question of a fall last evening.  Patient had slurred speech in triage.  Past history of stroke.  So there was concern about possible stroke but it sounds like some symptoms were present over 12 hours ago.  So a code stroke was not initiated.  No weakness reported to be generalized and not focal.  Patient's wife stated that really his speech to her is baseline.  But the event that occurred at home with him almost passing out and and being very weak is unusual.  Some consideration was given to possible sepsis.  Urinalysis was negative no elevation in white blood count.  Chest x-ray negative for pneumonia his initial troponin was  blood pressures have been certainly above 90 systolic but a little bit on the low side in the low 100s.  Initial lactic acid was slightly elevated but then repeats have been negative.  Do not think this is an infectious process.  In addition patient is urine drug screen was positive for pain medicine and possible that he had too much pain medicine in the morning and that accounts for the series of events.  Workup to include head CT labs without any significant findings.  Urinalysis pending.  However based on the sequence of events MRI was ordered.  Patient does have a pacemaker defibrillator however it is MRI compatible.  And interrogation of his pacemaker has been ordered.  Urinalysis was negative.  Patient here has shown signs of improvement.  But the studies are still pending.    If negative and patient remains doing well can be discharged home to follow-up with his doctors.  Final Clinical Impressions(s) / ED Diagnoses   Final diagnoses:  Fall, initial encounter  Near syncope  Weakness  Confusion    ED Discharge Orders    None      Addendum:.  Patient's interrogation of his pacemaker showed no significant abnormalities or arrhythmia  MRI is still pending.   Vanetta MuldersZackowski, Kima Malenfant, MD 05/20/17 [phone]

## 2017-05-22 ENCOUNTER — Ambulatory Visit: Payer: Self-pay | Admitting: Family Medicine

## 2017-05-25 LAB — CULTURE, BLOOD (ROUTINE X 2)
CULTURE: NO GROWTH
Culture: NO GROWTH
Special Requests: ADEQUATE
Special Requests: ADEQUATE

## 2017-05-29 ENCOUNTER — Ambulatory Visit (INDEPENDENT_AMBULATORY_CARE_PROVIDER_SITE_OTHER): Payer: BC Managed Care – PPO | Admitting: Family Medicine

## 2017-05-29 ENCOUNTER — Encounter: Payer: Self-pay | Admitting: Family Medicine

## 2017-05-29 VITALS — BP 116/74 | HR 86 | Temp 98.1°F | Ht 70.0 in | Wt 229.2 lb

## 2017-05-29 DIAGNOSIS — R531 Weakness: Secondary | ICD-10-CM

## 2017-05-29 DIAGNOSIS — R55 Syncope and collapse: Secondary | ICD-10-CM | POA: Diagnosis not present

## 2017-05-29 DIAGNOSIS — W19XXXA Unspecified fall, initial encounter: Secondary | ICD-10-CM

## 2017-05-29 MED ORDER — CARVEDILOL 6.25 MG PO TABS: 6.2500 mg | ORAL_TABLET | Freq: Two times a day (BID) | ORAL | 3 refills | Status: DC

## 2017-06-02 ENCOUNTER — Other Ambulatory Visit: Payer: Self-pay | Admitting: Family Medicine

## 2017-06-02 DIAGNOSIS — F329 Major depressive disorder, single episode, unspecified: Secondary | ICD-10-CM

## 2017-06-02 DIAGNOSIS — F32A Depression, unspecified: Secondary | ICD-10-CM

## 2017-06-02 DIAGNOSIS — F419 Anxiety disorder, unspecified: Secondary | ICD-10-CM

## 2017-06-02 NOTE — Progress Notes (Signed)
BP 116/74   Pulse 86   Temp 98.1 F (36.7 C) (Oral)   Ht 5\' 10"  (1.778 m)   Wt 229 lb 4 oz (104 kg)   BMI 32.89 kg/m    Subjective:    Patient ID: Douglas Carr, male    DOB: Nov 27, 1965, 52 y.o.   MRN: [id number]  HPI: Douglas Carr is a 52 y.o. male presenting on 05/29/2017 for Hospital/ER follow up (still feels off balance when he gets out of bed in morning)   HPI Hospital follow-up for syncope and fall and weakness Patient is coming in for follow-up for syncope after a fall and still feeling generalized weakness.  He was seen in the emergency department on 05/22/2017.  His wife was there when this happened and said that he was sitting down in a chair and then all of a sudden slumped over and fell down.  She said he was out of it for a few minutes afterwards and that when they called the emergency department.  He denies any further episodes of syncope.  His wife said he did not have any seizure-like activity.  He still feels off balance and especially when he gets out of bed in the morning.  He has a history significant for MI and stroke in the past.  Patient would like a referral for physical therapy again.  Relevant past medical, surgical, family and social history reviewed and updated as indicated. Interim medical history since our last visit reviewed. Allergies and medications reviewed and updated.  Review of Systems  Constitutional: Negative for chills and fever.  Respiratory: Negative for shortness of breath and wheezing.   Cardiovascular: Negative for chest pain and leg swelling.  Musculoskeletal: Negative for back pain and gait problem.  Skin: Negative for color change and rash.  Neurological: Positive for dizziness, weakness and light-headedness. Negative for seizures, numbness and headaches.  All other systems reviewed and are negative.   Per HPI unless specifically indicated above   Allergies as of 05/29/2017   No Known Allergies     Medication List        Accurate as of 05/29/17 11:59 PM. Always use your most recent med list.          acetaminophen 325 MG tablet Commonly known as:  TYLENOL Take 2 tablets (650 mg total) by mouth every 4 (four) hours as needed for headache or mild pain.   aspirin EC 81 MG tablet Take 81 mg by mouth daily.   atorvastatin 80 MG tablet Commonly known as:  LIPITOR TAKE 1 TABLET BY MOUTH DAILY   baclofen 10 MG tablet Commonly known as:  LIORESAL Take 1 tablet (10 mg total) by mouth 3 (three) times daily.   carvedilol 6.25 MG tablet Commonly known as:  COREG Take 1 tablet (6.25 mg total) by mouth 2 (two) times daily.   clonazePAM 0.5 MG tablet Commonly known as:  KLONOPIN TAKE 1 TABLET BY MOUTH TWICE A DAY   clopidogrel 75 MG tablet Commonly known as:  PLAVIX TAKE 1 TABLET BY MOUTH EVERY DAY IN THE EVENING   DULoxetine 30 MG capsule Commonly known as:  CYMBALTA TAKE 1 CAPSULE BY MOUTH EVERY DAY   ibuprofen 200 MG tablet Commonly known as:  ADVIL,MOTRIN Take 200 mg by mouth every 6 (six) hours as needed for moderate pain.   Insulin Degludec 200 UNIT/ML Sopn Commonly known as:  TRESIBA FLEXTOUCH Inject 116 Units into the skin daily.   KLOR-CON M20 20 MEQ tablet  Generic drug:  potassium chloride SA TAKE 1 TABLET DAILY   levETIRAcetam 500 MG tablet Commonly known as:  KEPPRA Take 1 tablet (500 mg total) by mouth 2 (two) times daily.   levETIRAcetam 1000 MG tablet Commonly known as:  KEPPRA TAKE 1 TABLET BY MOUTH TWICE A DAY   lisinopril 2.5 MG tablet Commonly known as:  PRINIVIL,ZESTRIL TAKE 1 TABLET BY MOUTH EVERY DAY   meloxicam 15 MG tablet Commonly known as:  MOBIC TAKE 1 TABLET BY MOUTH EVERY DAY   metFORMIN 500 MG tablet Commonly known as:  GLUCOPHAGE Take 1 tablet (500 mg total) by mouth 2 (two) times daily.   mirtazapine 7.5 MG tablet Commonly known as:  REMERON TAKE 1 TABLET AT BEDTIME FOR DEPRESSION   NOVOLOG FLEXPEN 100 UNIT/ML FlexPen Generic drug:   insulin aspart INJECT PER SLIDING SCALE BEFORE MEALS 201-250: 6UNITS, 251-300: 8U, 301-350: 10U, 351+ : CALL MD   ONETOUCH VERIO test strip Generic drug:  glucose blood TEST TWICE DAILY   primidone 50 MG tablet Commonly known as:  MYSOLINE Take 1 tablet (50 mg total) by mouth at bedtime.   tamsulosin 0.4 MG Caps capsule Commonly known as:  FLOMAX TAKE 1 CAPSULE DAILY IN THE EVENING   torsemide 20 MG tablet Commonly known as:  DEMADEX Take 1 tablet (20 mg total) by mouth daily.          Objective:    BP 116/74   Pulse 86   Temp 98.1 F (36.7 C) (Oral)   Ht 5\' 10"  (1.778 m)   Wt 229 lb 4 oz (104 kg)   BMI 32.89 kg/m   Wt Readings from Last 3 Encounters:  05/29/17 229 lb 4 oz (104 kg)  01/22/17 225 lb (102.1 kg)  12/22/16 223 lb (101.2 kg)    Physical Exam  Constitutional: He is oriented to person, place, and time. He appears well-developed and well-nourished. No distress.  Eyes: Conjunctivae are normal. No scleral icterus.  Neck: Neck supple. No thyromegaly present.  Cardiovascular: Normal rate, regular rhythm, normal heart sounds and intact distal pulses.  No murmur heard. Pulmonary/Chest: Effort normal and breath sounds normal. No respiratory distress. He has no wheezes. He has no rales.  Musculoskeletal: Normal range of motion. He exhibits no edema.  Slight weakness in left upper extremity, none in left lower extremity, residual from previous stroke.  Slight facial droop on left.  Lymphadenopathy:    He has no cervical adenopathy.  Neurological: He is alert and oriented to person, place, and time. Coordination normal.  Skin: Skin is warm and dry. No rash noted. He is not diaphoretic.  Psychiatric: He has a normal mood and affect. His behavior is normal.  Nursing note and vitals reviewed.       Assessment & Plan:   Problem List Items Addressed This Visit    None    Visit Diagnoses    Syncope, unspecified syncope type    -  Primary   Relevant Medications    carvedilol (COREG) 6.25 MG tablet   Fall, initial encounter       Relevant Orders   Ambulatory referral to Physical Therapy   Generalized weakness       History of a stroke   Relevant Orders   Ambulatory referral to Physical Therapy       Follow up plan: Return if symptoms worsen or fail to improve.  Counseling provided for all of the vaccine components Orders Placed This Encounter  Procedures  . Ambulatory  referral to Physical Therapy    Arville CareJoshua Raynelle Fujikawa, MD [facility] 05/29/2017, 4:12 PM

## 2017-06-04 ENCOUNTER — Other Ambulatory Visit: Payer: Self-pay | Admitting: Family Medicine

## 2017-06-05 ENCOUNTER — Other Ambulatory Visit: Payer: Self-pay | Admitting: Family Medicine

## 2017-06-06 ENCOUNTER — Other Ambulatory Visit: Payer: Self-pay | Admitting: Family Medicine

## 2017-06-09 DIAGNOSIS — Z029 Encounter for administrative examinations, unspecified: Secondary | ICD-10-CM

## 2017-06-10 ENCOUNTER — Encounter: Payer: Self-pay | Admitting: Physical Therapy

## 2017-06-10 ENCOUNTER — Ambulatory Visit: Payer: BC Managed Care – PPO | Attending: Family Medicine | Admitting: Physical Therapy

## 2017-06-10 VITALS — BP 110/70

## 2017-06-10 DIAGNOSIS — R2681 Unsteadiness on feet: Secondary | ICD-10-CM

## 2017-06-10 DIAGNOSIS — M6281 Muscle weakness (generalized): Secondary | ICD-10-CM | POA: Diagnosis present

## 2017-06-10 DIAGNOSIS — R262 Difficulty in walking, not elsewhere classified: Secondary | ICD-10-CM | POA: Insufficient documentation

## 2017-06-10 NOTE — Therapy (Signed)
[facility] [address] [place], [place], [address] Phone: [phone]   Fax:  [phone]  Physical Therapy Evaluation  Patient Details  Name: Douglas Carr MRN: [id number] Date of Birth: 04-25-1966 Referring Provider: Ivin Booty [name] MD   Encounter Date: 06/10/2017  PT End of Session - 06/10/17 1319    Visit Number  1    Number of Visits  12    Date for PT Re-Evaluation  07/22/17    PT Start Time  1115    PT Stop Time  1158    PT Time Calculation (min)  43 min    Activity Tolerance  Patient tolerated treatment well    Behavior During Therapy  [facility] for tasks assessed/performed       Past Medical History:  Diagnosis Date  . Acute ST elevation myocardial infarction (STEMI) involving left circumflex coronary artery (HCC) 05/07/2016   PCI to Cx-OM  . Anginal pain (HCC)    secondary to sm. vessel disease  . Anxiety   . Arthritis    BIL KNEE PAIN AND BIL ANKLE PAIN  . Bone spur of ankle   . Cardiac arrest (HCC) 05/24/2016   with v fib  . 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 Coronary artery disease involving native coronary artery of native heart with angina pectoris (HCC) 05/24/2016   Remote MI at 52 years of age, Last cath 2010-diffuse non-obstructive disease; last echo 06/16/08 -normal LV function, moderate concentric hypertrophy; nuc 08/2008 no ischemia;  medical therapy; STEMI May 07 2016 - PCI to Cx-OM  . Diabetes mellitus    ON ORAL MEDICATION AND INSULIN  . Dilated cardiomyopathy (HCC) 05/24/2016   EF 325-30% by Echo post STEMI (previously 30-35%)   . Hyperlipidemia   . Hypertension   . Myocardial infarction (HCC) 1996   Post MI  . Peripheral vascular disease (HCC)    HAS LEFT CAROTID ARTERY STENOSIS   AND IS S/P RIGHT CAROTID ENDARTERECTOMY 2010 Last carotid dopplers 01/08/2012 wth patent endarterectomy site  . Status post coronary artery stent placement      Past Surgical History:  Procedure Laterality Date  . APPENDECTOMY    . CARDIAC CATHETERIZATION     FEB 2010, significant branch vessel disease wth diag, marginal, PDA & PLA, nml. LV function  . CARDIAC CATHETERIZATION N/A 05/07/2016   Procedure: Left Heart Cath and Coronary Angiography;  Surgeon: Peter M Swaziland, MD;  Location: [facility] INVASIVE CV LAB;  Service: Cardiovascular;  Laterality: N/A;  . CARDIAC CATHETERIZATION N/A 05/07/2016   Procedure: Coronary Stent Intervention;  Surgeon: Peter M Swaziland, MD;  Location: [facility] INVASIVE CV LAB;  Service: Cardiovascular;  Laterality: N/A;  . CARDIAC CATHETERIZATION N/A 05/24/2016   Procedure: Left Heart Cath and Coronary Angiography;  Surgeon: Marykay Lex, MD;  Location: [facility] INVASIVE CV LAB;  Service: Cardiovascular;  Laterality: N/A;  . CARDIAC CATHETERIZATION N/A 05/24/2016   Procedure: Coronary Stent Intervention;  Surgeon: Marykay Lex, MD;  Location: [facility] INVASIVE CV LAB;  Service: Cardiovascular;  Laterality: N/A;  2.5x20 Promus to Ostial/proximal circumflex  . CAROTID ENDARTERECTOMY  09/2008   Rt CEA  . ICD IMPLANT N/A 06/30/2016   Procedure: ICD Implant;  Surgeon: Will Jorja Loa, MD;  Location: [facility] INVASIVE CV LAB;  Service: Cardiovascular;  Laterality: N/A;  . LUNG BIOPSY  2013   Bx's suggest granulomatous dz.  . RT ANKLE   2013    Vitals:  06/10/17 1306  BP: 110/70     Subjective Assessment - 06/10/17 1312    Subjective  The patient returns to physical therapy reporting a syncopal event on 05/20/17.  He presented to the ED and then to his PCP.  He reports that he has fallen 10 times over the last 6 months.  He is to be using an assisitve device but was not using one today though it is highly recommended.    Pertinent History  MI, HTN, ICD implant.    Patient Stated Goals  Walk and not fall.    Currently in Pain?  Yes         [facility] PT Assessment - 06/10/17 0001      Assessment   Medical Diagnosis  Fall.    Referring Provider   Arville Care MD    Onset Date/Surgical Date  -- Most recent:  05/20/17.      Precautions   Precautions  Fall      Restrictions   Weight Bearing Restrictions  No      Balance Screen   Has the patient fallen in the past 6 months  Yes    How many times?  -- 10.    Has the patient had a decrease in activity level because of a fear of falling?   Yes    Is the patient reluctant to leave their home because of a fear of falling?   Yes      Home Environment   Living Environment  Private residence      Prior Function   Level of Independence  Independent with household mobility with device      Cognition   Overall Cognitive Status  Within Functional Limits for tasks assessed Slurred speech.      Coordination   Gross Motor Movements are Fluid and Coordinated  Yes    Heel Shin Test  WNL.      Posture/Postural Control   Posture/Postural Control  Postural limitations    Postural Limitations  Rounded Shoulders;Forward head      ROM / Strength   AROM / PROM / Strength  AROM;Strength      AROM   Overall AROM Comments  Normal active bilateral LE range of motion.      Strength   Overall Strength Comments  Normal LE strength.      Special Tests   Other special tests  Romberg test required supervision due to unsteadiness but patient did not lose balance.      Bed Mobility   Bed Mobility  Rolling Right    Rolling Right  6: Modified independent (Device/Increase time)      Transfers   Transfers  Sit to Stand    Sit to Stand  6: Modified independent (Device/Increase time)      Ambulation/Gait   Ambulation/Gait  Yes    Gait Pattern  Scissoring;Ataxic      Standardized Balance Assessment   Standardized Balance Assessment  Berg Balance Test      Berg Balance Test   Sit to Stand  Able to stand  independently using hands    Standing Unsupported  Able to stand 2 minutes with supervision    Sitting with Back Unsupported but Feet Supported on Floor or Stool  Able to sit safely and  securely 2 minutes    Stand to Sit  Sits safely with minimal use of hands    Transfers  Able to transfer safely, minor use of hands    Standing Unsupported  with Eyes Closed  Able to stand 10 seconds with supervision    Standing Ubsupported with Feet Together  Able to place feet together independently and stand for 1 minute with supervision    From Standing, Reach Forward with Outstretched Arm  Can reach confidently >25 cm (10")    From Standing Position, Pick up Object from Floor  Able to pick up shoe, needs supervision    From Standing Position, Turn to Look Behind Over each Shoulder  Looks behind from both sides and weight shifts well    Turn 360 Degrees  Able to turn 360 degrees safely one side only in 4 seconds or less    Standing Unsupported, Alternately Place Feet on Step/Stool  Able to stand independently and complete 8 steps >20 seconds    Standing Unsupported, One Foot in Front  Able to take small step independently and hold 30 seconds    Standing on One Leg  Able to lift leg independently and hold equal to or more than 3 seconds    Total Score  45             Objective measurements completed on examination: See above findings.      [facility] Adult PT Treatment/Exercise - 06/10/17 0001      Exercises   Exercises  Knee/Hip      Knee/Hip Exercises: Aerobic   Nustep  Level 3 x 10 minutes.                  PT Long Term Goals - 06/10/17 1333      PT LONG TERM GOAL #1   Title  Independent with a HEP.    Time  6    Period  Weeks    Status  New      PT LONG TERM GOAL #2   Title  Berg score= 53/56.    Time  6    Period  Weeks    Status  New      PT LONG TERM GOAL #3   Title  Walk a community distance without an assisitve device.    Time  6    Period  Weeks    Status  New             Plan - 06/10/17 1328    Clinical Impression Statement  The patient presents to OPPT with a recent syncopal event and fall.  His Berg score was a 45/56 today.  His  gait is ataxic and he scissored a couple of time.  His LE strength and range of motion is WNL.  His gait instability has impaired his functional mobility.  Patient will benefit from skilled physical therapy to address deficits.    History and Personal Factors relevant to plan of care:  MI; ICD implant;     Clinical Presentation  Stable    Clinical Decision Making  Low    Clinical Impairments Affecting Rehab Potential  MI on 05/24/16, ICD implant, CVA.    PT Frequency  2x / week    PT Duration  8 weeks    PT Treatment/Interventions  ADLs/Self Care Home Management;Stair training;Gait training;Therapeutic activities;Therapeutic exercise;Balance training;Neuromuscular re-education;Patient/family education    PT Next Visit Plan  Balance and gait activites.  Please monitor BP and 02 sat.    Consulted and Agree with Plan of Care  Patient       Patient will benefit from skilled therapeutic intervention in order to improve the following deficits and impairments:  Abnormal  gait, Decreased activity tolerance, Decreased balance  Visit Diagnosis: Unsteadiness on feet - Plan: PT plan of care cert/re-cert     Problem List Patient Active Problem List   Diagnosis Date Noted  . Anxiety and depression 11/19/2016  . Ischemic cardiomyopathy 06/30/2016  . Diabetes mellitus with complication (HCC)   . Diabetes mellitus type 2 in obese (HCC)   . Acute blood loss anemia   . Myoclonus   . Anoxic brain injury (HCC)   . Seizures (HCC)   . Dilated cardiomyopathy (HCC) 05/24/2016  . Coronary artery disease involving native coronary artery of native heart with angina pectoris (HCC) 05/24/2016  . 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 Status post coronary artery stent placement   . Essential hypertension 09/21/2013  . Pulmonary nodules 10/16/2011    Arabella Revelle, [place] MPT 06/10/2017, 1:35 PM  [facility] [address] [place], [place], [address] Phone: [phone]   Fax:  [phone]  Name: [name] MRN: [id number] Date of Birth: 13-Oct-1965

## 2017-06-16 ENCOUNTER — Ambulatory Visit: Payer: BC Managed Care – PPO | Admitting: Physical Therapy

## 2017-06-16 DIAGNOSIS — M6281 Muscle weakness (generalized): Secondary | ICD-10-CM

## 2017-06-16 DIAGNOSIS — R2681 Unsteadiness on feet: Secondary | ICD-10-CM

## 2017-06-16 DIAGNOSIS — R262 Difficulty in walking, not elsewhere classified: Secondary | ICD-10-CM

## 2017-06-16 NOTE — Therapy (Signed)
[facility] [address] [place], [place], [address] Phone: [phone]   Fax:  [phone]  Physical Therapy Treatment  Patient Details  Name: Douglas Carr MRN: [id number] Date of Birth: [birth year] Referring Provider: Ivin Booty [name] MD   Encounter Date: 06/16/2017  PT End of Session - 06/16/17 1438    Visit Number  2    Number of Visits  12    Date for PT Re-Evaluation  07/22/17    PT Start Time  1438 late arrival    PT Stop Time  1515    PT Time Calculation (min)  37 min    Activity Tolerance  Patient tolerated treatment well       Past Medical History:  Diagnosis Date  . Acute ST elevation myocardial infarction (STEMI) involving left circumflex coronary artery (HCC) 05/07/2016   PCI to Cx-OM  . Anginal pain (HCC)    secondary to sm. vessel disease  . Anxiety   . Arthritis    BIL KNEE PAIN AND BIL ANKLE PAIN  . Bone spur of ankle   . Cardiac arrest (HCC) 05/24/2016   with v fib  . 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 Coronary artery disease involving native coronary artery of native heart with angina pectoris (HCC) 05/24/2016   Remote MI at 52 years of age, Last cath 2010-diffuse non-obstructive disease; last echo 06/16/08 -normal LV function, moderate concentric hypertrophy; nuc 08/2008 no ischemia;  medical therapy; STEMI May 07 2016 - PCI to Cx-OM  . Diabetes mellitus    ON ORAL MEDICATION AND INSULIN  . Dilated cardiomyopathy (HCC) 05/24/2016   EF 325-30% by Echo post STEMI (previously 30-35%)   . Hyperlipidemia   . Hypertension   . Myocardial infarction (HCC) 1996   Post MI  . Peripheral vascular disease (HCC)    HAS LEFT CAROTID ARTERY STENOSIS   AND IS S/P RIGHT CAROTID ENDARTERECTOMY 2010 Last carotid dopplers 01/08/2012 wth patent endarterectomy site  . Status post coronary artery stent placement     Past Surgical History:  Procedure Laterality  Date  . APPENDECTOMY    . CARDIAC CATHETERIZATION     FEB 2010, significant branch vessel disease wth diag, marginal, PDA & PLA, nml. LV function  . CARDIAC CATHETERIZATION N/A 05/07/2016   Procedure: Left Heart Cath and Coronary Angiography;  Surgeon: Peter M Swaziland, MD;  Location: [facility] INVASIVE CV LAB;  Service: Cardiovascular;  Laterality: N/A;  . CARDIAC CATHETERIZATION N/A 05/07/2016   Procedure: Coronary Stent Intervention;  Surgeon: Peter M Swaziland, MD;  Location: [facility] INVASIVE CV LAB;  Service: Cardiovascular;  Laterality: N/A;  . CARDIAC CATHETERIZATION N/A 05/24/2016   Procedure: Left Heart Cath and Coronary Angiography;  Surgeon: Marykay Lex, MD;  Location: [facility] INVASIVE CV LAB;  Service: Cardiovascular;  Laterality: N/A;  . CARDIAC CATHETERIZATION N/A 05/24/2016   Procedure: Coronary Stent Intervention;  Surgeon: Marykay Lex, MD;  Location: [facility] INVASIVE CV LAB;  Service: Cardiovascular;  Laterality: N/A;  2.5x20 Promus to Ostial/proximal circumflex  . CAROTID ENDARTERECTOMY  09/2008   Rt CEA  . ICD IMPLANT N/A 06/30/2016   Procedure: ICD Implant;  Surgeon: Will Jorja Loa, MD;  Location: [facility] INVASIVE CV LAB;  Service: Cardiovascular;  Laterality: N/A;  . LUNG BIOPSY  2013   Bx's suggest granulomatous dz.  . RT ANKLE   2013    There were no vitals filed for this visit.  Subjective Assessment -  06/16/17 1439    Subjective  The patient reports no complaints.    Pertinent History  MI, HTN, ICD implant.         [facility] - 06/16/17 0001      Assessment   Medical Diagnosis  Fall.      Precautions   Precautions  Ray[place] MaFall                  [facility] Adult PT Treatment/Exercise - 06/16/17 0001      Exercises   Exercises  Knee/Hip      Knee/Hip Exercises: Aerobic   Nustep  Level 3 x 10 minutes.      Knee/Hip Exercises: Standing   Heel Raises  Both;2 sets;10 reps    Other Standing Knee Exercises  sit to stands from elevated plinth 2x10    Other Standing Knee  Exercises  Lateral walking in hall way close supervison          Balance Exercises - 06/16/17 1512      Balance Exercises: Standing   Standing Eyes Opened  Narrow base of support (BOS);2 reps;10 secs    Rockerboard  Anterior/posterior;Lateral;EO;Other time (comment);Intermittent UE support 15 seconds 3x    Heel Raises Limitations  --    Toe Raise Limitations  x20 with UE support    Other Standing Exercises  toe tapping 6" 3x10, with both UE support, R UE support, and no UE support with CS             PT Long Term Goals - 06/10/17 1333      PT LONG TERM GOAL #1   Title  Independent with a HEP.    Time  6    Period  Weeks    Status  New      PT LONG TERM GOAL #2   Title  Berg score= 53/56.    Time  6    Period  Weeks    Status  New      PT LONG TERM GOAL #3   Title  Walk a community distance without an assisitve device.    Time  6    Period  Weeks    Status  New            Plan - 06/16/17 1448    Clinical Impression Statement  Vitals 2 minutes after NuStep: 99% SaO2, 71 bpm. Vitals after balance exercises 95% SaO2, 74 bpm. Patient was able to complete exercises with CG assist to prevent any loss of balance. Patient noted with increased AP sway with AP rockerboard balance but noted with adequate use of ankle strategies to maintain balance.    Clinical Presentation  Stable    Clinical Decision Making  Low    Clinical Impairments Affecting Rehab Potential  MI on 05/24/16, ICD implant, CVA.    PT Frequency  2x / week    PT Duration  8 weeks    PT Treatment/Interventions  ADLs/Self Care Home Management;Stair training;Gait training;Therapeutic activities;Therapeutic exercise;Balance training;Neuromuscular re-education;Patient/family education    PT Next Visit Plan  Balance and gait activites.  Please monitor BP and 02 sat.    Consulted and Agree with Plan of Care  Patient       Patient will benefit from skilled therapeutic intervention in order to improve the  following deficits and impairments:  Abnormal gait, Decreased activity tolerance, Decreased balance  Visit Diagnosis: Unsteadiness on feet  Difficulty walking  Muscle weakness (generalized)     Problem List Patient  Active Problem List   Diagnosis Date Noted  . Anxiety and depression 11/19/2016  . Ischemic cardiomyopathy 06/30/2016  . Diabetes mellitus with complication (HCC)   . Diabetes mellitus type 2 in obese (HCC)   . Acute blood loss anemia   . Myoclonus   . Anoxic brain injury (HCC)   . Seizures (HCC)   . Dilated cardiomyopathy (HCC) 05/24/2016  . Coronary artery disease involving native coronary artery of native heart with angina pectoris (HCC) 05/24/2016  . 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 Status post coronary artery stent placement   . Essential hypertension 09/21/2013  . Pulmonary nodules 10/16/2011   Guss Bunde, PT, DPT 06/16/2017, 3:53 PM  [facility] [address] [place], [place], [address] Phone: [phone]   Fax:  [phone]  Name: GARVIS DOWNUM MRN: [id number] Date of Birth: [birth year]

## 2017-06-18 ENCOUNTER — Ambulatory Visit: Payer: BC Managed Care – PPO | Admitting: Physical Therapy

## 2017-06-18 DIAGNOSIS — R2681 Unsteadiness on feet: Secondary | ICD-10-CM

## 2017-06-18 DIAGNOSIS — M6281 Muscle weakness (generalized): Secondary | ICD-10-CM

## 2017-06-18 DIAGNOSIS — R262 Difficulty in walking, not elsewhere classified: Secondary | ICD-10-CM

## 2017-06-18 NOTE — Therapy (Signed)
[facility] [address] [place], [place], [address] Phone: [phone]   Fax:  [phone]  Physical Therapy Treatment  Patient Details  Name: Douglas SermonsClifford M Carr MRN: [id number] Date of Birth: 07-09-1965 Referring Provider: Ivin BootyJoshua [name] MD   Encounter Date: 06/18/2017  PT End of Session - 06/18/17 1445    Visit Number  3    Number of Visits  12    Date for PT Re-Evaluation  07/22/17    PT Start Time  0231    PT Stop Time  0319    PT Time Calculation (min)  48 min    Equipment Utilized During Treatment  Gait belt    Activity Tolerance  Patient tolerated treatment well    Behavior During Therapy  [facility] for tasks assessed/performed       Past Medical History:  Diagnosis Date  . Acute ST elevation myocardial infarction (STEMI) involving left circumflex coronary artery (HCC) 05/07/2016   PCI to Cx-OM  . Anginal pain (HCC)    secondary to sm. vessel disease  . Anxiety   . Arthritis    BIL KNEE PAIN AND BIL ANKLE PAIN  . Bone spur of ankle   . Cardiac arrest (HCC) 05/24/2016   with v fib  . 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 Coronary artery disease involving native coronary artery of native heart with angina pectoris (HCC) 05/24/2016   Remote MI at 52 years of age, Last cath 2010-diffuse non-obstructive disease; last echo 06/16/08 -normal LV function, moderate concentric hypertrophy; nuc 08/2008 no ischemia;  medical therapy; STEMI May 07 2016 - PCI to Cx-OM  . Diabetes mellitus    ON ORAL MEDICATION AND INSULIN  . Dilated cardiomyopathy (HCC) 05/24/2016   EF 325-30% by Echo post STEMI (previously 30-35%)   . Hyperlipidemia   . Hypertension   . Myocardial infarction (HCC) 1996   Post MI  . Peripheral vascular disease (HCC)    HAS LEFT CAROTID ARTERY STENOSIS   AND IS S/P RIGHT CAROTID ENDARTERECTOMY 2010 Last carotid dopplers 01/08/2012 wth patent endarterectomy site  .  Status post coronary artery stent placement     Past Surgical History:  Procedure Laterality Date  . APPENDECTOMY    . CARDIAC CATHETERIZATION     FEB 2010, significant branch vessel disease wth diag, marginal, PDA & PLA, nml. LV function  . CARDIAC CATHETERIZATION N/A 05/07/2016   Procedure: Left Heart Cath and Coronary Angiography;  Surgeon: Peter M SwazilandJordan, MD;  [facility];  Service: Cardiovascular;  Laterality: N/A;  . CARDIAC CATHETERIZATION N/A 05/07/2016   Procedure: Coronary Stent Intervention;  Surgeon: Peter M SwazilandJordan, MD;  [facility];  Service: Cardiovascular;  Laterality: N/A;  . CARDIAC CATHETERIZATION N/A 05/24/2016   Procedure: Left Heart Cath and Coronary Angiography;  Surgeon: Marykay Lexavid W Harding, MD;  [facility];  Service: Cardiovascular;  Laterality: N/A;  . CARDIAC CATHETERIZATION N/A 05/24/2016   Procedure: Coronary Stent Intervention;  Surgeon: Marykay Lexavid W Harding, MD;  [facility];  Service: Cardiovascular;  Laterality: N/A;  2.5x20 Promus to Ostial/proximal circumflex  . CAROTID ENDARTERECTOMY  09/2008   Rt CEA  . ICD IMPLANT N/A 06/30/2016   Procedure: ICD Implant;  Surgeon: Will Jorja LoaMartin Camnitz, MD;  [facility];  Service: Cardiovascular;  Laterality: N/A;  . LUNG BIOPSY  2013   Bx's suggest granulomatous dz.  Marland Kitchen. RT  ANKLE   2013    There were no vitals filed for this visit.  Subjective Assessment - 06/18/17 1447    Subjective  Patient reported no complaints. Patient stated he felt good after first treatment visit.    Pertinent History  MI, HTN, ICD implant.    Patient Stated Goals  Walk and not fall.         [facility] PT Assessment - 06/18/17 0001      Assessment   Medical Diagnosis  Fall.                  [facility] Adult PT Treatment/Exercise - 06/18/17 0001      Knee/Hip Exercises: Stretches   Gastroc Stretch  Both;30 seconds;3 reps on rockerboard      Knee/Hip Exercises:  Aerobic   Nustep  Level 3 x12 minutes      Knee/Hip Exercises: Standing   Heel Raises  Both;2 sets;10 reps    Forward Step Up  Both;10 reps;Hand Hold: 0;Step Height: 6" intermittent UE support          Balance Exercises - 06/18/17 1821      Balance Exercises: Standing   Standing Eyes Opened  Narrow base of support (BOS);Head turns;3 reps;30 secs Lateral head turns and flexion/extension 3x30" each    Rockerboard  Anterior/posterior;Lateral;30 seconds;Intermittent UE support;EO    Step Over Hurdles / Cones  Lateral stepping over 2 stacked cones 4 times, Forward stepping x3             PT Long Term Goals - 06/10/17 1333      PT LONG TERM GOAL #1   Title  Independent with a HEP.    Time  6    Period  Weeks    Status  New      PT LONG TERM GOAL #2   Title  Berg score= 53/56.    Time  6    Period  Weeks    Status  New      PT LONG TERM GOAL #3   Title  Walk a community distance without an assisitve device.    Time  6    Period  Weeks    Status  New            Plan - 06/18/17 1520    Clinical Impression Statement  Vitals assessed throughout session. Normal heart rate response during exercise. Patient was provided with rest breaks throughout session. Patient continues to require CG and close supervision during balance actiivities and intermittent UE support. Continue NBOS with head turns and progress to gait with head turns as pt stated he has difficulties.     Clinical Presentation  Stable    Clinical Decision Making  Low    Clinical Impairments Affecting Rehab Potential  MI on 05/24/16, ICD implant, CVA.    PT Frequency  2x / week    PT Duration  8 weeks    PT Treatment/Interventions  ADLs/Self Care Home Management;Stair training;Gait training;Therapeutic activities;Therapeutic exercise;Balance training;Neuromuscular re-education;Patient/family education    PT Next Visit Plan  Balance and gait activites.  Please monitor BP and 02 sat.    Consulted and Agree  with Plan of Care  Patient       Patient will benefit from skilled therapeutic intervention in order to improve the following deficits and impairments:  Abnormal gait, Decreased activity tolerance, Decreased balance  Visit Diagnosis: Unsteadiness on feet  Difficulty walking  Muscle weakness (generalized)     Problem List Patient Active  Problem List   Diagnosis Date Noted  . Anxiety and depression 11/19/2016  . Ischemic cardiomyopathy 06/30/2016  . Diabetes mellitus with complication (HCC)   . Diabetes mellitus type 2 in obese (HCC)   . Acute blood loss anemia   . Myoclonus   . Anoxic brain injury (HCC)   . Seizures (HCC)   . Dilated cardiomyopathy (HCC) 05/24/2016  . Coronary artery disease involving native coronary artery of native heart with angina pectoris (HCC) 05/24/2016  . 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 Status post coronary artery stent placement   . Essential hypertension 09/21/2013  . Pulmonary nodules 10/16/2011   Guss Bunde, PT, DPT 06/18/2017, 6:24 PM  [facility] [address] [place], [place], [address] Phone: [phone]   Fax:  [phone]  Name: Douglas Carr MRN: [id number] Date of Birth: 1966/04/20

## 2017-06-23 ENCOUNTER — Encounter: Payer: Self-pay | Admitting: Physical Therapy

## 2017-06-23 ENCOUNTER — Ambulatory Visit: Payer: BC Managed Care – PPO | Admitting: Physical Therapy

## 2017-06-23 VITALS — HR 82

## 2017-06-23 DIAGNOSIS — R2681 Unsteadiness on feet: Secondary | ICD-10-CM | POA: Diagnosis not present

## 2017-06-23 NOTE — Therapy (Signed)
[facility] [address] [place], [place], [address] Phone: [phone]   Fax:  [phone]  Physical Therapy Treatment  Patient Details  Name: Douglas Carr MRN: [id number] Date of Birth: 1965-08-20 Referring Provider: Ivin Booty [name] MD   Encounter Date: 06/23/2017  PT End of Session - 06/23/17 1434    Visit Number  4    Number of Visits  12    Date for PT Re-Evaluation  07/22/17    PT Start Time  1430    PT Stop Time  1522    PT Time Calculation (min)  52 min    Activity Tolerance  Patient tolerated treatment well    Behavior During Therapy  [facility] for tasks assessed/performed       Past Medical History:  Diagnosis Date  . Acute ST elevation myocardial infarction (STEMI) involving left circumflex coronary artery (HCC) 05/07/2016   PCI to Cx-OM  . Anginal pain (HCC)    secondary to sm. vessel disease  . Anxiety   . Arthritis    BIL KNEE PAIN AND BIL ANKLE PAIN  . Bone spur of ankle   . Cardiac arrest (HCC) 05/24/2016   with v fib  . 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 Coronary artery disease involving native coronary artery of native heart with angina pectoris (HCC) 05/24/2016   Remote MI at 52 years of age, Last cath 2010-diffuse non-obstructive disease; last echo 06/16/08 -normal LV function, moderate concentric hypertrophy; nuc 08/2008 no ischemia;  medical therapy; STEMI May 07 2016 - PCI to Cx-OM  . Diabetes mellitus    ON ORAL MEDICATION AND INSULIN  . Dilated cardiomyopathy (HCC) 05/24/2016   EF 325-30% by Echo post STEMI (previously 30-35%)   . Hyperlipidemia   . Hypertension   . Myocardial infarction (HCC) 1996   Post MI  . Peripheral vascular disease (HCC)    HAS LEFT CAROTID ARTERY STENOSIS   AND IS S/P RIGHT CAROTID ENDARTERECTOMY 2010 Last carotid dopplers 01/08/2012 wth patent endarterectomy site  . Status post coronary artery stent placement      Past Surgical History:  Procedure Laterality Date  . APPENDECTOMY    . CARDIAC CATHETERIZATION     FEB 2010, significant branch vessel disease wth diag, marginal, PDA & PLA, nml. LV function  . CARDIAC CATHETERIZATION N/A 05/07/2016   Procedure: Left Heart Cath and Coronary Angiography;  Surgeon: Peter M Swaziland, MD;  Location[facility] INVASIVE CV LAB;  Service: Cardiovascular;  Laterality: N/A;  . CARDIAC CATHETERIZATION N/A 05/07/2016   Procedure: Coronary Stent Intervention;  Surgeon: Peter M Swaziland, MD;  Location: [facility] INVASIVE CV LAB;  Service: Cardiovascular;  Laterality: N/A;  . CARDIAC CATHETERIZATION N/A 05/24/2016   Procedure: Left Heart Cath and Coronary Angiography;  Surgeon: Marykay Lex, MD;  [facility]: [facility] CV [facility];  Service: Cardiovascular;  Laterality: N/A;  . CARDIAC CATHETERIZATION N/A 05/24/2016   Procedure: Coronary Stent Intervention;  Surgeon: Marykay Lex, MD;  Location[facility] INVASIVE CV LAB;  Service: Cardiovascular;  Laterality: N/A;  2.5x20 Promus to Ostial/proximal circumflex  . CAROTID ENDARTERECTOMY  09/2008   Rt CEA  . ICD IMPLANT N/A 06/30/2016   Procedure: ICD Implant;  Surgeon: Will Jorja Loa, MD;  [facility];  Service: Cardiovascular;  Laterality: N/A;  . LUNG BIOPSY  2013   Bx's suggest granulomatous dz.  . RT ANKLE   2013    Vitals:  06/23/17 1538  Pulse: 82  SpO2: 99%    Subjective Assessment - 06/23/17 1433    Subjective  No complaints upon arrival.    Pertinent History  MI, HTN, ICD implant.    Patient Stated Goals  Walk and not fall.    Currently in Pain?  No/denies         [facility] PT Assessment - 06/23/17 0001      Assessment   Medical Diagnosis  Fall.      Precautions   Precautions  Fall                  [facility] Adult PT Treatment/Exercise - 06/23/17 0001      Knee/Hip Exercises: Aerobic   Nustep  L5 x12 min      Knee/Hip Exercises: Standing   Heel Raises  Both;2 sets;10 reps B toe raise x20  reps    Forward Step Up  Both;20 reps;Hand Hold: 2;Step Height: 6"      Knee/Hip Exercises: Seated   Sit to Sand  20 reps;without UE support          Balance Exercises - 06/23/17 1532      Balance Exercises: Standing   Standing Eyes Opened  Narrow base of support (BOS);Head turns;Foam/compliant surface Side turns, up/down x2 min each intermittant UEs    Rockerboard  Anterior/posterior;Lateral;EO;Intermittent UE support;Other (comment) x2 min each    Step Over Hurdles / Cones  Forward and lateral stepping over cones x2 RT each    Other Standing Exercises  BOSU x2 min             PT Long Term Goals - 06/10/17 1333      PT LONG TERM GOAL #1   Title  Independent with a HEP.    Time  6    Period  Weeks    Status  New      PT LONG TERM GOAL #2   Title  Berg score= 53/56.    Time  6    Period  Weeks    Status  New      PT LONG TERM GOAL #3   Title  Walk a community distance without an assisitve device.    Time  6    Period  Weeks    Status  New            Plan - 06/23/17 1535    Clinical Impression Statement  Patient tolerated today's treatment well with vitals monitored following Nustep today. Patient indicted today that most balance issues as following getting up from bed which he related to "a little dizziness." Patient overall did well with B ankle instability on uneven and challenging surfaces. Patient more unstable with head turns as well as SLS such as with the forward and lateral stepping. Patient required rest break during balance training due to dizziness reports. Patient also reported fatigue following treatment.    Clinical Impairments Affecting Rehab Potential  MI on 05/24/16, ICD implant, CVA.    PT Frequency  2x / week    PT Duration  8 weeks    PT Treatment/Interventions  ADLs/Self Care Home Management;Stair training;Gait training;Therapeutic activities;Therapeutic exercise;Balance training;Neuromuscular re-education;Patient/family education    PT  Next Visit Plan  Balance and gait activites.  Please monitor BP and 02 sat.    Consulted and Agree with Plan of Care  Patient       Patient will benefit from skilled therapeutic intervention in order to improve the following deficits and impairments:  Abnormal gait, Decreased activity tolerance, Decreased balance  Visit Diagnosis: Unsteadiness on feet     Problem List Patient Active Problem List   Diagnosis Date Noted  . Anxiety and depression 11/19/2016  . Ischemic cardiomyopathy 06/30/2016  . Diabetes mellitus with complication (HCC)   . Diabetes mellitus type 2 in obese (HCC)   . Acute blood loss anemia   . Myoclonus   . Anoxic brain injury (HCC)   . Seizures (HCC)   . Dilated cardiomyopathy (HCC) 05/24/2016  . Coronary artery disease involving native coronary artery of native heart with angina pectoris (HCC) 05/24/2016  . 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 Status post coronary artery stent placement   . Essential hypertension 09/21/2013  . Pulmonary nodules 10/16/2011    Marvell Fuller, PTA 06/23/2017, 3:39 PM  [facility] [address] [place], [place], [address] Phone: [phone]   Fax:  [phone]  Name: FAOLAN SPRINGFIELD MRN: [id number] Date of Birth: 1965/09/30

## 2017-06-25 ENCOUNTER — Encounter: Payer: Self-pay | Admitting: Physical Therapy

## 2017-06-25 ENCOUNTER — Ambulatory Visit: Payer: BC Managed Care – PPO | Admitting: Physical Therapy

## 2017-06-25 DIAGNOSIS — M6281 Muscle weakness (generalized): Secondary | ICD-10-CM

## 2017-06-25 DIAGNOSIS — R2681 Unsteadiness on feet: Secondary | ICD-10-CM | POA: Diagnosis not present

## 2017-06-25 DIAGNOSIS — R262 Difficulty in walking, not elsewhere classified: Secondary | ICD-10-CM

## 2017-06-25 NOTE — Patient Instructions (Signed)
Pelvic Tilt: Posterior - Legs Bent (Supine)  Tighten stomach and flatten back by rolling pelvis down. Hold _10___ seconds. Relax. Repeat _10-30___ times per set. Do __2__ sets per session. Do _2___ sessions per day.    Bridging  Slowly raise buttocks from floor, keeping stomach tight. Repeat _10___ times per set. Do __2__ sets per session. Do __2__ sessions per day.   Lower Body: Toe Rise   Standing, place feet apart. Hold arms out for balance or use support. Rise up on toes. Hold _3___ seconds, then lower. Repeat immediately. Repeat __10__ times. Do __2__ sessions per day.    Half Squat to Chair   Stand with feet shoulder width apart. Push buttocks backward and lower slowly, sitting in chair lightly and returning to standing position. Complete _2_ sets of 10_ repetitions. Perform __2-3_ sessions per day.

## 2017-06-25 NOTE — Therapy (Signed)
[facility] [address] [place], [place], [address] Phone: [phone]   Fax:  [phone]  Physical Therapy Treatment  Patient Details  Name: Douglas Carr MRN: [id number] Date of Birth: Nov 29, 1965 Referring Provider: Ivin Booty [name] MD   Encounter Date: 06/25/2017  PT End of Session - 06/25/17 1508    Visit Number  5    Number of Visits  12    Date for PT Re-Evaluation  07/22/17    PT Start Time  1430    PT Stop Time  1511    PT Time Calculation (min)  41 min    Activity Tolerance  Patient tolerated treatment well    Behavior During Therapy  [facility] for tasks assessed/performed       Past Medical History:  Diagnosis Date  . Acute ST elevation myocardial infarction (STEMI) involving left circumflex coronary artery (HCC) 05/07/2016   PCI to Cx-OM  . Anginal pain (HCC)    secondary to sm. vessel disease  . Anxiety   . Arthritis    BIL KNEE PAIN AND BIL ANKLE PAIN  . Bone spur of ankle   . Cardiac arrest (HCC) 05/24/2016   with v fib  . 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 Coronary artery disease involving native coronary artery of native heart with angina pectoris (HCC) 05/24/2016   Remote MI at 52 years of age, Last cath 2010-diffuse non-obstructive disease; last echo 06/16/08 -normal LV function, moderate concentric hypertrophy; nuc 08/2008 no ischemia;  medical therapy; STEMI May 07 2016 - PCI to Cx-OM  . Diabetes mellitus    ON ORAL MEDICATION AND INSULIN  . Dilated cardiomyopathy (HCC) 05/24/2016   EF 325-30% by Echo post STEMI (previously 30-35%)   . Hyperlipidemia   . Hypertension   . Myocardial infarction (HCC) 1996   Post MI  . Peripheral vascular disease (HCC)    HAS LEFT CAROTID ARTERY STENOSIS   AND IS S/P RIGHT CAROTID ENDARTERECTOMY 2010 Last carotid dopplers 01/08/2012 wth patent endarterectomy site  . Status post coronary artery stent placement      Past Surgical History:  Procedure Laterality Date  . APPENDECTOMY    . CARDIAC CATHETERIZATION     FEB 2010, significant branch vessel disease wth diag, marginal, PDA & PLA, nml. LV function  . CARDIAC CATHETERIZATION N/A 05/07/2016   Procedure: Left Heart Cath and Coronary Angiography;  Surgeon: Peter M Swaziland, MD;  Location: [facility] INVASIVE CV LAB;  Service: Cardiovascular;  Laterality: N/A;  . CARDIAC CATHETERIZATION N/A 05/07/2016   Procedure: Coronary Stent Intervention;  Surgeon: Peter M Swaziland, MD;  Location: [facility] INVAS[facility] CV LAB;  Service: Cardiovascular;  Laterality: N/A;  . CARDIAC CATHETERIZATION N/A 05/24/2016   Procedure: Left Heart Cath and Coronary Angiography;  Surgeon: Marykay Lex, MD;  Location: [facility] INVASIVE CV LAB;  Service: Cardiovascular;  Laterality: N/A;  . CARDIAC CATHETERIZATION N/A 05/24/2016   Procedure: Coronary Stent Intervention;  Surgeon: Marykay Lex, MD;  Location: [facility] INVASIVE CV LAB;  Service: Cardiovascular;  Laterality: N/A;  2.5x20 Promus to Ostial/proximal circumflex  . CAROTID ENDARTERECTOMY  09/2008   Rt CEA  . ICD IMPLANT N/A 06/30/2016   Procedure: ICD Implant;  Surgeon: Will Jorja Loa, MD;  Location: [facility] INVASIVE CV LAB;  Service: Cardiovascular;  Laterality: N/A;  . LUNG BIOPSY  2013   Bx's suggest granulomatous dz.  . RT ANKLE   2013    There were no  vitals filed for this visit.  Subjective Assessment - 06/25/17 1433    Subjective  No complaints upon arrival, no assist device and no falls reported    Pertinent History  MI, HTN, ICD implant.    Patient Stated Goals  Walk and not fall.    Currently in Pain?  No/denies                      [facility] Adult PT Treatment/Exercise - 06/25/17 0001      Exercises   Exercises  Lumbar;Knee/Hip      Lumbar Exercises: Supine   Ab Set  20 reps;5 seconds    Glut Set  20 reps;3 seconds    Bridge  20 reps      Knee/Hip Exercises: Aerobic   Nustep  L5 x39min UE/LE, monitored           Balance Exercises - 06/25/17 1445      Balance Exercises: Standing   Standing Eyes Opened  Wide (BOA);Narrow base of support (BOS);Foam/compliant surface;4 reps    Standing, One Foot on a Step  Foam/compliant surface;6 inch;10 secs;4 reps    Rockerboard  Anterior/posterior x58min    Step Ups  Forward;6 inch;Intermittent UE support 2x10    Heel Raises Limitations  x10    Toe Raise Limitations  x10    Sit to Stand Time  x10    Other Standing Exercises  BOSU x2 min        PT Education - 06/25/17 1505    Education provided  Yes    Education Details  HEP    Person(s) Educated  Patient    Methods  Explanation;Demonstration;Handout    Comprehension  Verbalized understanding;Returned demonstration          PT Long Term Goals - 06/10/17 1333      PT LONG TERM GOAL #1   Title  Independent with a HEP.    Time  6    Period  Weeks    Status  New      PT LONG TERM GOAL #2   Title  Berg score= 53/56.    Time  6    Period  Weeks    Status  New      PT LONG TERM GOAL #3   Title  Walk a community distance without an assisitve device.    Time  6    Period  Weeks    Status  New            Plan - 06/25/17 1506    Clinical Impression Statement  Patient tolerated treatment well today. Patient able to progress with core activation exercises and balance activites. HEP provided today. SBA/CGA for balance exercises for safety. Educated patient on asist device and fall prevention. Goals progressing.     Clinical Impairments Affecting Rehab Potential  MI on 05/24/16, ICD implant, CVA.    PT Frequency  2x / week    PT Duration  8 weeks    PT Treatment/Interventions  ADLs/Self Care Home Management;Stair training;Gait training;Therapeutic activities;Therapeutic exercise;Balance training;Neuromuscular re-education;Patient/family education    PT Next Visit Plan  Balance and gait activites.  Please monitor BP and 02 sat.    Consulted and Agree with Plan of Care  Patient        Patient will benefit from skilled therapeutic intervention in order to improve the following deficits and impairments:  Abnormal gait, Decreased activity tolerance, Decreased balance  Visit Diagnosis: Unsteadiness on feet  Difficulty walking  Muscle weakness (generalized)     Problem List Patient Active Problem List   Diagnosis Date Noted  . Anxiety and depression 11/19/2016  . Ischemic cardiomyopathy 06/30/2016  . Diabetes mellitus with complication (HCC)   . Diabetes mellitus type 2 in obese (HCC)   . Acute blood loss anemia   . Myoclonus   . Anoxic brain injury (HCC)   . Seizures (HCC)   . Dilated cardiomyopathy (HCC) 05/24/2016  . Coronary artery disease involving native coronary artery of native heart with angina pectoris (HCC) 05/24/2016  . 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 Status post coronary artery stent placement   . Essential hypertension 09/21/2013  . Pulmonary nodules 10/16/2011    Trellis Vanoverbeke P, PTA 06/25/2017, 3:11 PM  [facility] [address] [place], [place], [address] Phone: [phone]   Fax:  [phone]  Name: Douglas Carr MRN: [id number] Date of Birth: Jun 25, 1965

## 2017-06-29 ENCOUNTER — Other Ambulatory Visit: Payer: Self-pay | Admitting: Family Medicine

## 2017-06-30 ENCOUNTER — Ambulatory Visit: Payer: BC Managed Care – PPO | Admitting: Physical Therapy

## 2017-06-30 ENCOUNTER — Encounter: Payer: Self-pay | Admitting: Physical Therapy

## 2017-06-30 ENCOUNTER — Other Ambulatory Visit: Payer: Self-pay | Admitting: Neurology

## 2017-06-30 DIAGNOSIS — R2681 Unsteadiness on feet: Secondary | ICD-10-CM

## 2017-06-30 MED ORDER — LEVETIRACETAM 500 MG PO TABS: 500.0000 mg | ORAL_TABLET | Freq: Two times a day (BID) | ORAL | 0 refills | Status: DC

## 2017-06-30 MED ORDER — PRIMIDONE 50 MG PO TABS: 50.0000 mg | ORAL_TABLET | Freq: Every day | ORAL | 0 refills | Status: DC

## 2017-06-30 NOTE — Therapy (Signed)
[facility] [address] [place], [place], [address] Phone: [phone]   Fax:  [phone]  Physical Therapy Treatment  Patient Details  Name: Douglas Carr MRN: [id number] Date of Birth: 31-Dec-1965 Referring Provider: Ivin Booty [name] MD   Encounter Date: 06/30/2017  PT End of Session - 06/30/17 1436    Visit Number  6    Number of Visits  12    Date for PT Re-Evaluation  07/22/17    PT Start Time  1433    PT Stop Time  1516    PT Time Calculation (min)  43 min    Activity Tolerance  Patient tolerated treatment well    Behavior During Therapy  [facility] for tasks assessed/performed       Past Medical History:  Diagnosis Date  . Acute ST elevation myocardial infarction (STEMI) involving left circumflex coronary artery (HCC) 05/07/2016   PCI to Cx-OM  . Anginal pain (HCC)    secondary to sm. vessel disease  . Anxiety   . Arthritis    BIL KNEE PAIN AND BIL ANKLE PAIN  . Bone spur of ankle   . Cardiac arrest (HCC) 05/24/2016   with v fib  . 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 Coronary artery disease involving native coronary artery of native heart with angina pectoris (HCC) 05/24/2016   Remote MI at 52 years of age, Last cath 2010-diffuse non-obstructive disease; last echo 06/16/08 -normal LV function, moderate concentric hypertrophy; nuc 08/2008 no ischemia;  medical therapy; STEMI May 07 2016 - PCI to Cx-OM  . Diabetes mellitus    ON ORAL MEDICATION AND INSULIN  . Dilated cardiomyopathy (HCC) 05/24/2016   EF 325-30% by Echo post STEMI (previously 30-35%)   . Hyperlipidemia   . Hypertension   . Myocardial infarction (HCC) 1996   Post MI  . Peripheral vascular disease (HCC)    HAS LEFT CAROTID ARTERY STENOSIS   AND IS S/P RIGHT CAROTID ENDARTERECTOMY 2010 Last carotid dopplers 01/08/2012 wth patent endarterectomy site  . Status post coronary artery stent placement      Past Surgical History:  Procedure Laterality Date  . APPENDECTOMY    . CARDIAC CATHETERIZATION     FEB 2010, significant branch vessel disease wth diag, marginal, PDA & PLA, nml. LV function  . CARDIAC CATHETERIZATION N/A 05/07/2016   Procedure: Left Heart Cath and Coronary Angiography;  Surgeon: Peter M Swaziland, MD;  Location: [facility] INVASIVE CV LAB;  Service: Cardiovascular;  Laterality: N/A;  . CARDIAC CATHETERIZATION N/A 05/07/2016   Procedure: Coronary Stent Intervention;  Surgeon: Peter M Swaziland, MD;  Location: [facility] INVASIVE CV LAB;  Service: Cardiovascular;  Laterality: N/A;  . CARDIAC CATHETERIZATION N/A 05/24/2016   Procedure: Left Heart Cath and Coronary Angiography;  Surgeon: Marykay Lex, MD;  Location: [facility] INVASIVE CV LAB;  Service: Cardiovascular;  Laterality: N/A;  . CARDIAC CATHETERIZATION N/A 05/24/2016   Procedure: Coronary Stent Intervention;  Surgeon: Marykay Lex, MD;  Location: [facility] INVASIVE CV LAB;  Service: Cardiovascular;  Laterality: N/A;  2.5x20 Promus to Ostial/proximal circumflex  . CAROTID ENDARTERECTOMY  09/2008   Rt CEA  . ICD IMPLANT N/A 06/30/2016   Procedure: ICD Implant;  Surgeon: Will Jorja Loa, MD;  Location: [facility] INVASIVE CV LAB;  Service: Cardiovascular;  Laterality: N/A;  . LUNG BIOPSY  2013   Bx's suggest granulomatous dz.  . RT ANKLE   2013    There were no  vitals filed for this visit.  Subjective Assessment - 06/30/17 1435    Subjective  Reports losing his balance this morning and hurting in R low back. Reports that he fell between the end table and love seat as he fell forward trying to kiss his wife.    Pertinent History  MI, HTN, ICD implant.    Patient Stated Goals  Walk and not fall.    Currently in Pain?  Yes    Pain Location  Back    Pain Orientation  Right;Mid;Lower    Pain Descriptors / Indicators  Discomfort    Pain Type  Acute pain    Pain Onset  Today         [facility] - 06/30/17 0001      Assessment   Medical  Diagnosis  Fall.      Precautions   Precautions  Fall                  [facility] Adult PT Treatment/Exercise - 06/30/17 0001      Lumbar Exercises: Seated   Sit to Stand  20 reps      Lumbar Exercises: Supine   Bridge  20 reps    Other Supine Lumbar Exercises  mini crunch x20 reps      Knee/Hip Exercises: Aerobic   Nustep  L5 x15 min          Balance Exercises - 06/30/17 1506      Balance Exercises: Standing   Standing Eyes Closed  Narrow base of support (BOS);Foam/compliant surface x3 min    Standing, One Foot on a Step  Eyes open;6 inch x2 min each with only toe touch to step    Step Ups  Forward;6 inch;Intermittent UE support x10 reps each; contralateral toe touch only    Heel Raises Limitations  x20 reps    Toe Raise Limitations  x20 reps    Other Standing Exercises  DLS on BOSU x3 min without UE support             PT Long Term Goals - 06/10/17 1333      PT LONG TERM GOAL #1   Title  Independent with a HEP.    Time  6    Period  Weeks    Status  New      PT LONG TERM GOAL #2   Title  Berg score= 53/56.    Time  6    Period  Weeks    Status  New      PT LONG TERM GOAL #3   Title  Walk a community distance without an assisitve device.    Time  6    Period  Weeks    Status  New            Plan - 06/30/17 1520    Clinical Impression Statement  Patient continues to tolerate treatment well although he is fatigued following treatment. Greater emphasis on solid core strength to enhance balance with supine strengthening today. Patient still unstable on uneven surfaces such as airex with eyes closed as well as BOSU. Only contralateral toe touch allowed for forward step ups and foot on stool to emphasize SLS.    Clinical Impairments Affecting Rehab Potential  MI on 05/24/16, ICD implant, CVA.    PT Frequency  2x / week    PT Duration  8 weeks    PT Treatment/Interventions  ADLs/Self Care Home Management;Stair training;Gait training;Therapeutic  activities;Therapeutic exercise;Balance training;Neuromuscular re-education;Patient/family  education    PT Next Visit Plan  Balance and gait activites.  Please monitor BP and 02 sat. Complete BERG to reassess balance.    Consulted and Agree with Plan of Care  Patient       Patient will benefit from skilled therapeutic intervention in order to improve the following deficits and impairments:  Abnormal gait, Decreased activity tolerance, Decreased balance  Visit Diagnosis: Unsteadiness on feet     Problem List Patient Active Problem List   Diagnosis Date Noted  . Anxiety and depression 11/19/2016  . Ischemic cardiomyopathy 06/30/2016  . Diabetes mellitus with complication (HCC)   . Diabetes mellitus type 2 in obese (HCC)   . Acute blood loss anemia   . Myoclonus   . Anoxic brain injury (HCC)   . Seizures (HCC)   . Dilated cardiomyopathy (HCC) 05/24/2016  . Coronary artery disease involving native coronary artery of native heart with angina pectoris (HCC) 05/24/2016  . 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 Status post coronary artery stent placement   . Essential hypertension 09/21/2013  . Pulmonary nodules 10/16/2011    Marvell Fuller, PTA 06/30/2017, 3:23 PM  [facility] [address] [place], [place], [address] Phone: [phone]   Fax:  [phone]  Name: Douglas Carr MRN: [id number] Date of Birth: 1965/05/11

## 2017-07-02 ENCOUNTER — Ambulatory Visit: Payer: BC Managed Care – PPO | Admitting: *Deleted

## 2017-07-02 DIAGNOSIS — R2681 Unsteadiness on feet: Secondary | ICD-10-CM | POA: Diagnosis not present

## 2017-07-02 DIAGNOSIS — M6281 Muscle weakness (generalized): Secondary | ICD-10-CM

## 2017-07-02 DIAGNOSIS — R262 Difficulty in walking, not elsewhere classified: Secondary | ICD-10-CM

## 2017-07-02 NOTE — Therapy (Signed)
[facility] [address] [place], [place], [address] Phone: [phone]   Fax:  [phone]  Physical Therapy Treatment  Patient Details  Name: Douglas Carr MRN: [id number] Date of Birth: Sep 26, 1965 Referring Provider: Ivin Booty [name] MD   Encounter Date: 07/02/2017  PT End of Session - 07/02/17 1522    Visit Number  7    Number of Visits  12    Date for PT Re-Evaluation  07/22/17    PT Start Time  1430    PT Stop Time  1520    PT Time Calculation (min)  50 min       Past Medical History:  Diagnosis Date  . Acute ST elevation myocardial infarction (STEMI) involving left circumflex coronary artery (HCC) 05/07/2016   PCI to Cx-OM  . Anginal pain (HCC)    secondary to sm. vessel disease  . Anxiety   . Arthritis    BIL KNEE PAIN AND BIL ANKLE PAIN  . Bone spur of ankle   . Cardiac arrest (HCC) 05/24/2016   with v fib  . 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 Coronary artery disease involving native coronary artery of native heart with angina pectoris (HCC) 05/24/2016   Remote MI at 52 years of age, Last cath 2010-diffuse non-obstructive disease; last echo 06/16/08 -normal LV function, moderate concentric hypertrophy; nuc 08/2008 no ischemia;  medical therapy; STEMI May 07 2016 - PCI to Cx-OM  . Diabetes mellitus    ON ORAL MEDICATION AND INSULIN  . Dilated cardiomyopathy (HCC) 05/24/2016   EF 325-30% by Echo post STEMI (previously 30-35%)   . Hyperlipidemia   . Hypertension   . Myocardial infarction (HCC) 1996   Post MI  . Peripheral vascular disease (HCC)    HAS LEFT CAROTID ARTERY STENOSIS   AND IS S/P RIGHT CAROTID ENDARTERECTOMY 2010 Last carotid dopplers 01/08/2012 wth patent endarterectomy site  . Status post coronary artery stent placement     Past Surgical History:  Procedure Laterality Date  . APPENDECTOMY    . CARDIAC CATHETERIZATION     FEB 2010,  significant branch vessel disease wth diag, marginal, PDA & PLA, nml. LV function  . CARDIAC CATHETERIZATION N/A 05/07/2016   Procedure: Left Heart Cath and Coronary Angiography;  Surgeon: Peter M Swaziland, MD;  Location: [facility] INVASIVE CV LAB;  Service: Cardiovascular;  Laterality: N/A;  . CARDIAC CATHETERIZATION N/A 05/07/2016   Procedure: Coronary Stent Intervention;  Surgeon: Peter M Swaziland, MD;  Location: [facility] INVASIVE CV LAB;  Service: Cardiovascular;  Laterality: N/A;  . CARDIAC CATHETERIZATION N/A 05/24/2016   Procedure: Left Heart Cath and Coronary Angiography;  Surgeon: Marykay Lex, MD;  Location: [facility] INVASIVE CV LAB;  Service: Cardiovascular;  Laterality: N/A;  . CARDIAC CATHETERIZATION N/A 05/24/2016   Procedure: Coronary Stent Intervention;  Surgeon: Marykay Lex, MD;  Location: [facility] INVASIVE CV LAB;  Service: Cardiovascular;  Laterality: N/A;  2.5x20 Promus to Ostial/proximal circumflex  . CAROTID ENDARTERECTOMY  09/2008   Rt CEA  . ICD IMPLANT N/A 06/30/2016   Procedure: ICD Implant;  Surgeon: Will Jorja Loa, MD;  Location: [facility] INVASIVE CV LAB;  Service: Cardiovascular;  Laterality: N/A;  . LUNG BIOPSY  2013   Bx's suggest granulomatous dz.  . RT ANKLE   2013    There were no vitals filed for this visit.  [facility] Adult PT Treatment/Exercise - 07/02/17 0001      Exercises   Exercises  Lumbar;Knee/Hip      Lumbar Exercises: Seated   Sit to Stand  20 reps      Lumbar Exercises: Supine   Bridge  20 reps      Knee/Hip Exercises: Aerobic   Nustep  L5 x15 min          Balance Exercises - 07/02/17 1449      Balance Exercises: Standing   Standing Eyes Opened  Wide (BOA);Narrow base of support (BOS);Foam/compliant surface;4 reps    Standing Eyes Closed  Narrow base of support (BOS);Foam/compliant surface    SLS  Eyes open;Intermittent upper extremity support    Standing, One Foot on a Step  Eyes open;6 inch    Rockerboard  Anterior/posterior  x49min    Step Ups  Forward;6 inch;Intermittent UE support;Lateral    Heel Raises Limitations  x20 reps    Other Standing Exercises  DLS on BOSU x3 min without UE support             PT Long Term Goals - 06/10/17 1333      PT LONG TERM GOAL #1   Title  Independent with a HEP.    Time  6    Period  Weeks    Status  New      PT LONG TERM GOAL #2   Title  Berg score= 53/56.    Time  6    Period  Weeks    Status  New      PT LONG TERM GOAL #3   Title  Walk a community distance without an assisitve device.    Time  6    Period  Weeks    Status  New            Plan - 07/02/17 1523    Clinical Impression Statement  Pt arrived today doing better with more energy. He was able to complete all strengthening exs and balance act.'s Pt continues to improve and states he can stand atleast 30 mins now before fatigue.    Clinical Presentation  Stable    Clinical Decision Making  Low    Clinical Impairments Affecting Rehab Potential  MI on 05/24/16, ICD implant, CVA.    PT Frequency  2x / week    PT Duration  8 weeks    PT Next Visit Plan  Balance and gait activites.  Please monitor BP and 02 sat. Complete BERG to reassess balance.    Consulted and Agree with Plan of Care  Patient       Patient will benefit from skilled therapeutic intervention in order to improve the following deficits and impairments:  Abnormal gait, Decreased activity tolerance, Decreased balance  Visit Diagnosis: Unsteadiness on feet  Difficulty walking  Muscle weakness (generalized)     Problem List Patient Active Problem List   Diagnosis Date Noted  . Anxiety and depression 11/19/2016  . Ischemic cardiomyopathy 06/30/2016  . Diabetes mellitus with complication (HCC)   . Diabetes mellitus type 2 in obese (HCC)   . Acute blood loss anemia   . Myoclonus   . Anoxic brain injury (HCC)   . Seizures (HCC)   . Dilated cardiomyopathy (HCC) 05/24/2016  . Coronary artery disease involving native  coronary artery of native heart with angina pectoris (HCC) 05/24/2016  . 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 Status  post coronary artery stent placement   . Essential hypertension 09/21/2013  . Pulmonary nodules 10/16/2011    Zamiyah Resendes,CHRIS, PTA 07/02/2017, 3:28 PM  [facility] [address] [place], [place], [address] Phone: [phone]   Fax:  [phone]  Name: Margarita SermonsClifford M Noack MRN: [id number] Date of Birth: [birth year]

## 2017-07-07 ENCOUNTER — Ambulatory Visit: Payer: BC Managed Care – PPO | Attending: Family Medicine | Admitting: *Deleted

## 2017-07-07 ENCOUNTER — Encounter: Payer: Self-pay | Admitting: *Deleted

## 2017-07-07 DIAGNOSIS — R2681 Unsteadiness on feet: Secondary | ICD-10-CM | POA: Diagnosis present

## 2017-07-07 DIAGNOSIS — M6281 Muscle weakness (generalized): Secondary | ICD-10-CM | POA: Diagnosis present

## 2017-07-07 DIAGNOSIS — R262 Difficulty in walking, not elsewhere classified: Secondary | ICD-10-CM

## 2017-07-07 NOTE — Therapy (Signed)
[facility] [address] [place], [place], [address] Phone: [phone]   Fax:  [phone]  Physical Therapy Treatment  Patient Details  Name: Douglas Carr MRN: [id number] Date of Birth: 04-15-1966 Referring Provider: Ivin Booty [name] MD   Encounter Date: 07/07/2017  PT End of Session - 07/07/17 1449    Visit Number  8    Number of Visits  12    Date for PT Re-Evaluation  07/22/17    PT Start Time  1430    PT Stop Time  1519    PT Time Calculation (min)  49 min       Past Medical History:  Diagnosis Date  . Acute ST elevation myocardial infarction (STEMI) involving left circumflex coronary artery (HCC) 05/07/2016   PCI to Cx-OM  . Anginal pain (HCC)    secondary to sm. vessel disease  . Anxiety   . Arthritis    BIL KNEE PAIN AND BIL ANKLE PAIN  . Bone spur of ankle   . Cardiac arrest (HCC) 05/24/2016   with v fib  . 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 Coronary artery disease involving native coronary artery of native heart with angina pectoris (HCC) 05/24/2016   Remote MI at 52 years of age, Last cath 2010-diffuse non-obstructive disease; last echo 06/16/08 -normal LV function, moderate concentric hypertrophy; nuc 08/2008 no ischemia;  medical therapy; STEMI May 07 2016 - PCI to Cx-OM  . Diabetes mellitus    ON ORAL MEDICATION AND INSULIN  . Dilated cardiomyopathy (HCC) 05/24/2016   EF 325-30% by Echo post STEMI (previously 30-35%)   . Hyperlipidemia   . Hypertension   . Myocardial infarction (HCC) 1996   Post MI  . Peripheral vascular disease (HCC)    HAS LEFT CAROTID ARTERY STENOSIS   AND IS S/P RIGHT CAROTID ENDARTERECTOMY 2010 Last carotid dopplers 01/08/2012 wth patent endarterectomy site  . Status post coronary artery stent placement     Past Surgical History:  Procedure Laterality Date  . APPENDECTOMY    . CARDIAC CATHETERIZATION     FEB 2010,  significant branch vessel disease wth diag, marginal, PDA & PLA, nml. LV function  . CARDIAC CATHETERIZATION N/A 05/07/2016   Procedure: Left Heart Cath and Coronary Angiography;  Surgeon: Peter M Swaziland, MD;  Location: [facility] INVASIVE CV LAB;  Service: Cardiovascular;  Laterality: N/A;  . CARDIAC CATHETERIZATION N/A 05/07/2016   Procedure: Coronary Stent Intervention;  Surgeon: Peter M Swaziland, MD;  Location: [facility] INVASIVE CV LAB;  Service: Cardiovascular;  Laterality: N/A;  . CARDIAC CATHETERIZATION N/A 05/24/2016   Procedure: Left Heart Cath and Coronary Angiography;  Surgeon: Marykay Lex, MD;  Location: [facility] INVASIVE CV LAB;  Service: Cardiovascular;  Laterality: N/A;  . CARDIAC CATHETERIZATION N/A 05/24/2016   Procedure: Coronary Stent Intervention;  Surgeon: Marykay Lex, MD;  Location: [facility] INVASIVE CV LAB;  Service: Cardiovascular;  Laterality: N/A;  2.5x20 Promus to Ostial/proximal circumflex  . CAROTID ENDARTERECTOMY  09/2008   Rt CEA  . ICD IMPLANT N/A 06/30/2016   Procedure: ICD Implant;  Surgeon: Will Jorja Loa, MD;  Location: [facility] INVASIVE CV LAB;  Service: Cardiovascular;  Laterality: N/A;  . LUNG BIOPSY  2013   Bx's suggest granulomatous dz.  . RT ANKLE   2013    There were no vitals filed for this visit.  Subjective Assessment - 07/07/17 1448    Subjective  Doing ok today  Pertinent History  MI, HTN, ICD implant.    Patient Stated Goals  Walk and not fall.    Currently in Pain?  No/denies    Pain Onset  Today                      [facility] Adult PT Treatment/Exercise - 07/07/17 0001      Berg Balance Test   Sit to Stand  Able to stand without using hands and stabilize independently    Standing Unsupported  Able to stand safely 2 minutes    Sitting with Back Unsupported but Feet Supported on Floor or Stool  Able to sit safely and securely 2 minutes    Stand to Sit  Sits safely with minimal use of hands    Transfers  Able to transfer safely, minor use of hands     Standing Unsupported with Eyes Closed  Able to stand 10 seconds safely    Standing Ubsupported with Feet Together  Able to place feet together independently and stand 1 minute safely    From Standing, Reach Forward with Outstretched Arm  Can reach forward >12 cm safely (5")    From Standing Position, Pick up Object from Floor  Able to pick up shoe safely and easily    From Standing Position, Turn to Look Behind Over each Shoulder  Looks behind from both sides and weight shifts well    Turn 360 Degrees  Able to turn 360 degrees safely one side only in 4 seconds or less    Standing Unsupported, Alternately Place Feet on Step/Stool  Able to stand independently and complete 8 steps >20 seconds    Standing Unsupported, One Foot in Front  Able to plae foot ahead of the other independently and hold 30 seconds    Standing on One Leg  Able to lift leg independently and hold 5-10 seconds    Total Score  51      Exercises   Exercises  Lumbar;Knee/Hip      Knee/Hip Exercises: Aerobic   Nustep  L5 x15 min          Balance Exercises - 07/07/17 1454      Balance Exercises: Standing   Standing Eyes Opened  Wide (BOA);Narrow base of support (BOS);Foam/compliant surface;4 reps    Standing Eyes Closed  Narrow base of support (BOS);Foam/compliant surface             PT Long Term Goals - 07/07/17 1518      PT LONG TERM GOAL #1   Title  Independent with a HEP.    Time  6    Period  Weeks    Status  On-going      PT LONG TERM GOAL #2   Title  Berg score= 53/56.    Time  6    Period  Weeks    Status  On-going      PT LONG TERM GOAL #3   Title  Walk a community distance without an assisitve device.    Baseline  Patient has to stop around 600 feet/5 min due to hip pain    Time  6    Period  Weeks    Status  On-going            Plan - 07/07/17 1520    Clinical Impression Statement  Pt arrived today doing better with no pain. Rx focused on balance act.'s and Berg test was  performed. Pt did well with a  score of 51/56, but unable to meet LTG of 53/56.     Clinical Presentation  Stable    Clinical Decision Making  Low    Clinical Impairments Affecting Rehab Potential  MI on 05/24/16, ICD implant, CVA.       Patient will benefit from skilled therapeutic intervention in order to improve the following deficits and impairments:  Abnormal gait, Decreased activity tolerance, Decreased balance  Visit Diagnosis: Unsteadiness on feet  Difficulty walking  Muscle weakness (generalized)     Problem List Patient Active Problem List   Diagnosis Date Noted  . Anxiety and depression 11/19/2016  . Ischemic cardiomyopathy 06/30/2016  . Diabetes mellitus with complication (HCC)   . Diabetes mellitus type 2 in obese (HCC)   . Acute blood loss anemia   . Myoclonus   . Anoxic brain injury (HCC)   . Seizures (HCC)   . Dilated cardiomyopathy (HCC) 05/24/2016  . Coronary artery disease involving native coronary artery of native heart with angina pectoris (HCC) 05/24/2016  . 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 Status post coronary artery stent placement   . Essential hypertension 09/21/2013  . Pulmonary nodules 10/16/2011    RAMSEUR,CHRIS , PTA 07/07/2017, 3:35 PM  [facility] [address] [place], [place], [address] Phone: [phone]   Fax:  [phone]  Name: Douglas Carr MRN: [id number] Date of Birth: 1965-12-05

## 2017-07-09 ENCOUNTER — Ambulatory Visit: Payer: BC Managed Care – PPO | Admitting: *Deleted

## 2017-07-09 DIAGNOSIS — R2681 Unsteadiness on feet: Secondary | ICD-10-CM

## 2017-07-09 DIAGNOSIS — R262 Difficulty in walking, not elsewhere classified: Secondary | ICD-10-CM

## 2017-07-09 DIAGNOSIS — M6281 Muscle weakness (generalized): Secondary | ICD-10-CM

## 2017-07-09 NOTE — Therapy (Signed)
[facility] [address] [place], [place], [address] Phone: [phone]   Fax:  [phone]  Physical Therapy Treatment  Patient Details  Name: Douglas Carr MRN: [id number] Date of Birth: 12/01/1965 Referring Provider: Ivin Booty [name] MD   Encounter Date: 07/09/2017    Past Medical History:  Diagnosis Date  . Acute ST elevation myocardial infarction (STEMI) involving left circumflex coronary artery (HCC) 05/07/2016   PCI to Cx-OM  . Anginal pain (HCC)    secondary to sm. vessel disease  . Anxiety   . Arthritis    BIL KNEE PAIN AND BIL ANKLE PAIN  . Bone spur of ankle   . Cardiac arrest (HCC) 05/24/2016   with v fib  . 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 Coronary artery disease involving native coronary artery of native heart with angina pectoris (HCC) 05/24/2016   Remote MI at 52 years of age, Last cath 2010-diffuse non-obstructive disease; last echo 06/16/08 -normal LV function, moderate concentric hypertrophy; nuc 08/2008 no ischemia;  medical therapy; STEMI May 07 2016 - PCI to Cx-OM  . Diabetes mellitus    ON ORAL MEDICATION AND INSULIN  . Dilated cardiomyopathy (HCC) 05/24/2016   EF 325-30% by Echo post STEMI (previously 30-35%)   . Hyperlipidemia   . Hypertension   . Myocardial infarction (HCC) 1996   Post MI  . Peripheral vascular disease (HCC)    HAS LEFT CAROTID ARTERY STENOSIS   AND IS S/P RIGHT CAROTID ENDARTERECTOMY 2010 Last carotid dopplers 01/08/2012 wth patent endarterectomy site  . Status post coronary artery stent placement     Past Surgical History:  Procedure Laterality Date  . APPENDECTOMY    . CARDIAC CATHETERIZATION     FEB 2010, significant branch vessel disease wth diag, marginal, PDA & PLA, nml. LV function  . CARDIAC CATHETERIZATION N/A 05/07/2016   Procedure: Left Heart Cath and Coronary Angiography;  Surgeon: Peter M Swaziland, MD;   Location: [facility] INVASIVE CV LAB;  Service: Cardiovascular;  Laterality: N/A;  . CARDIAC CATHETERIZATION N/A 05/07/2016   Procedure: Coronary Stent Intervention;  Surgeon: Peter M Swaziland, MD;  Location: [facility] INVASIVE CV LAB;  Service: Cardiovascular;  Laterality: N/A;  . CARDIAC CATHETERIZATION N/A 05/24/2016   Procedure: Left Heart Cath and Coronary Angiography;  Surgeon: Marykay Lex, MD;  Location: [facility] INVASIVE CV LAB;  Service: Cardiovascular;  Laterality: N/A;  . CARDIAC CATHETERIZATION N/A 05/24/2016   Procedure: Coronary Stent Intervention;  Surgeon: Marykay Lex, MD;  Location: [facility] INVASIVE CV LAB;  Service: Cardiovascular;  Laterality: N/A;  2.5x20 Promus to Ostial/proximal circumflex  . CAROTID ENDARTERECTOMY  09/2008   Rt CEA  . ICD IMPLANT N/A 06/30/2016   Procedure: ICD Implant;  Surgeon: Will Jorja Loa, MD;  Location: [facility] INVASIVE CV LAB;  Service: Cardiovascular;  Laterality: N/A;  . LUNG BIOPSY  2013   Bx's suggest granulomatous dz.  . RT ANKLE   2013    There were no vitals filed for this visit.  Subjective Assessment - 07/09/17 1449    Subjective  Doing ok today    Pertinent History  MI, HTN, ICD implant.                      [facility] Adult PT Treatment/Exercise - 07/09/17 0001      Exercises   Exercises  Lumbar;Knee/Hip      Lumbar Exercises: Machines for Strengthening   Cybex Lumbar  Extension  20# 3x fatigue    Cybex Knee Extension  30#s 3x fatigue      Knee/Hip Exercises: Aerobic   Nustep  L5 x15 min          Balance Exercises - 07/09/17 1455      Balance Exercises: Standing   Standing Eyes Opened  Wide (BOA);Narrow base of support (BOS);Foam/compliant surface;4 reps    Standing Eyes Closed  Narrow base of support (BOS);Foam/compliant surface    SLS  Eyes open;Intermittent upper extremity support    Rockerboard  Anterior/posterior;Lateral x765min    Step Ups  Forward;6 inch;Intermittent UE support;Lateral    Heel Raises Limitations  x20 reps     Other Standing Exercises  DLS on BOSU x3 min without UE support             PT Long Term Goals - 07/07/17 1518      PT LONG TERM GOAL #1   Title  Independent with a HEP.    Time  6    Period  Weeks    Status  On-going      PT LONG TERM GOAL #2   Title  Berg score= 53/56.    Time  6    Period  Weeks    Status  On-going      PT LONG TERM GOAL #3   Title  Walk a community distance without an assisitve device.    Baseline  Patient has to stop around 600 feet/5 min due to hip pain    Time  6    Period  Weeks    Status  On-going            Plan - 07/09/17 1450    Clinical Impression Statement  Pt arrived today doing fairly well with no c/o pain.Rx focused on balance and strengthening today. Pt continues to progress with balance and was able to score 51 on Berg this week , but not enough to meet LTG 53/56.    Clinical Presentation  Stable    Clinical Decision Making  Low    Clinical Impairments Affecting Rehab Potential  MI on 05/24/16, ICD implant, CVA.    PT Frequency  2x / week    PT Duration  8 weeks    PT Treatment/Interventions  ADLs/Self Care Home Management;Stair training;Gait training;Therapeutic activities;Therapeutic exercise;Balance training;Neuromuscular re-education;Patient/family education    PT Next Visit Plan  Balance and gait activites.  Please monitor BP and 02 sat. Complete BERG to reassess balance.    Consulted and Agree with Plan of Care  Patient       Patient will benefit from skilled therapeutic intervention in order to improve the following deficits and impairments:  Abnormal gait, Decreased activity tolerance, Decreased balance  Visit Diagnosis: Unsteadiness on feet  Difficulty walking  Muscle weakness (generalized)     Problem List Patient Active Problem List   Diagnosis Date Noted  . Anxiety and depression 11/19/2016  . Ischemic cardiomyopathy 06/30/2016  . Diabetes mellitus with complication (HCC)   . Diabetes mellitus type  2 in obese (HCC)   . Acute blood loss anemia   . Myoclonus   . Anoxic brain injury (HCC)   . Seizures (HCC)   . Dilated cardiomyopathy (HCC) 05/24/2016  . Coronary artery disease involving native coronary artery of native heart with angina pectoris (HCC) 05/24/2016  . 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 Status post coronary artery stent placement   .  Essential hypertension 09/21/2013  . Pulmonary nodules 10/16/2011    Kharma Sampsel,CHRIS, PTA 07/09/2017, 3:25 PM  [facility] [address] [place], [place], [address] Phone: [phone]   Fax:  [phone]  Name: AMAY MIJANGOS MRN: [id number] Date of Birth: May 22, 1965

## 2017-07-10 DIAGNOSIS — Z0289 Encounter for other administrative examinations: Secondary | ICD-10-CM

## 2017-07-14 ENCOUNTER — Ambulatory Visit (INDEPENDENT_AMBULATORY_CARE_PROVIDER_SITE_OTHER): Payer: BC Managed Care – PPO | Admitting: *Deleted

## 2017-07-14 ENCOUNTER — Ambulatory Visit: Payer: BC Managed Care – PPO | Admitting: *Deleted

## 2017-07-14 DIAGNOSIS — R2681 Unsteadiness on feet: Secondary | ICD-10-CM | POA: Diagnosis not present

## 2017-07-14 DIAGNOSIS — M6281 Muscle weakness (generalized): Secondary | ICD-10-CM

## 2017-07-14 DIAGNOSIS — I255 Ischemic cardiomyopathy: Secondary | ICD-10-CM

## 2017-07-14 DIAGNOSIS — R262 Difficulty in walking, not elsewhere classified: Secondary | ICD-10-CM

## 2017-07-14 NOTE — Therapy (Signed)
[facility] [place], [place], [address] Phone: [phone]   Fax:  [phone]  Physical Therapy Treatment  Patient Details  Name: Douglas Carr MRN: [id number] Date of Birth: 1966/04/06 Referring Provider: Vonna Kotyk [name] MD   Encounter Date: 07/14/2017  PT End of Session - 07/14/17 1453    Visit Number  9    Number of Visits  12    Date for PT Re-Evaluation  07/22/17    PT Start Time  1430    PT Stop Time  1516    PT Time Calculation (min)  46 min       Past Medical History:  Diagnosis Date  . Acute ST elevation myocardial infarction (STEMI) involving left circumflex coronary artery ([place]) 05/07/2016   PCI to Cx-OM  . Anginal pain ([place])    secondary to sm. vessel disease  . Anxiety   . Arthritis    BIL KNEE PAIN AND BIL ANKLE PAIN  . Bone spur of ankle   . Cardiac arrest ([place]) 05/24/2016   with v fib  . 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 Coronary artery disease involving native coronary artery of native heart with angina pectoris ([place]) 05/24/2016   Remote MI at 52 years of age, Last cath 2010-diffuse non-obstructive disease; last echo 06/16/08 -normal LV function, moderate concentric hypertrophy; nuc 08/2008 no ischemia;  medical therapy; STEMI May 07 2016 - PCI to Cx-OM  . Diabetes mellitus    ON ORAL MEDICATION AND INSULIN  . Dilated cardiomyopathy ([place]) 05/24/2016   EF 325-30% by Echo post STEMI (previously 30-35%)   . Hyperlipidemia   . Hypertension   . Myocardial infarction ([place]) 1996   Post MI  . Peripheral vascular disease ([place])    HAS LEFT CAROTID ARTERY STENOSIS   AND IS S/P RIGHT CAROTID ENDARTERECTOMY 2010 Last carotid dopplers 01/08/2012 wth patent endarterectomy site  . Status post coronary artery stent placement     Past Surgical History:  Procedure Laterality Date  . APPENDECTOMY    . CARDIAC CATHETERIZATION     FEB 2010,  significant branch vessel disease wth diag, marginal, PDA & PLA, nml. LV function  . CARDIAC CATHETERIZATION N/A 05/07/2016   Procedure: Left Heart Cath and Coronary Angiography;  Surgeon: Peter M Martinique, MD;  Location: [facility] CV LAB;  Service: Cardiovascular;  Laterality: N/A;  . CARDIAC CATHETERIZATION N/A 05/07/2016   Procedure: Coronary Stent Intervention;  Surgeon: Peter M Martinique, MD;  Location: [place] CV LAB;  Service: Cardiovascular;  Laterality: N/A;  . CARDIAC CATHETERIZATION N/A 05/24/2016   Procedure: Left Heart Cath and Coronary Angiography;  Surgeon: [name], MD;  Location: [place] CV LAB;  Service: Cardiovascular;  Laterality: N/A;  . CARDIAC CATHETERIZATION N/A 05/24/2016   Procedure: Coronary Stent Intervention;  Surgeon: [name], MD;  Location: [place] CV LAB;  Service: Cardiovascular;  Laterality: N/A;  2.5x20 Promus to Ostial/proximal circumflex  . CAROTID ENDARTERECTOMY  09/2008   Rt CEA  . ICD IMPLANT N/A 06/30/2016   Procedure: ICD Implant;  Surgeon: Will Meredith Leeds, MD;  Location: [place] CV LAB;  Service: Cardiovascular;  Laterality: N/A;  . LUNG BIOPSY  2013   Bx's suggest granulomatous dz.  . RT ANKLE   2013    There were no vitals filed for this visit.  Subjective Assessment - 07/14/17 1446    Subjective  Did ok after last Rx  Pertinent History  MI, HTN, ICD implant.    Patient Stated Goals  Walk and not fall.    Currently in Pain?  No/denies                      [facility] Adult PT Treatment/Exercise - 07/14/17 0001      Exercises   Exercises  Lumbar;Knee/Hip      Lumbar Exercises: Machines for Strengthening   Cybex Lumbar Extension  20# 3x fatigue    Cybex Knee Extension  30#s 3x fatigue      Knee/Hip Exercises: Aerobic   Nustep  L5 x15 min          Balance Exercises - 07/14/17 1448      Balance Exercises: Standing   Standing Eyes Opened  Wide (BOA);Narrow base of support (BOS);Foam/compliant  surface;4 reps    Standing Eyes Closed  Narrow base of support (BOS);Foam/compliant surface    SLS  Eyes open;Intermittent upper extremity support    Rockerboard  Anterior/posterior;Lateral x 3 mins each    Step Ups  Forward;6 inch;Intermittent UE support;Lateral    Other Standing Exercises  DLS on BOSU x3 min without UE support             PT Long Term Goals - 07/14/17 1513      PT LONG TERM GOAL #3   Title  Walk a community distance without an assisitve device.    Baseline  Patient has to stop around 600 feet/5 min due to hip pain    Time  6    Period  Weeks    Status  Achieved            Plan - 07/14/17 1523    Clinical Impression Statement  Pt arrived today doing Fairly well and feels he is doing better with with gait in the community. He is not using an AD now and can walk for about 15 mins before needing to rest. (LTG met). He was  able to perform all strengthening and balance act.'s without complaints except fatigue and needed one rest break.    Clinical Impairments Affecting Rehab Potential  MI on 05/24/16, ICD implant, CVA.    PT Frequency  2x / week    PT Duration  8 weeks    PT Treatment/Interventions  ADLs/Self Care Home Management;Stair training;Gait training;Therapeutic activities;Therapeutic exercise;Balance training;Neuromuscular re-education;Patient/family education    PT Next Visit Plan  Balance and gait activites.  Please monitor BP and 02 sat. Complete BERG to reassess balance.    Consulted and Agree with Plan of Care  Patient       Patient will benefit from skilled therapeutic intervention in order to improve the following deficits and impairments:  Abnormal gait, Decreased activity tolerance, Decreased balance  Visit Diagnosis: Unsteadiness on feet  Difficulty walking  Muscle weakness (generalized)     Problem List Patient Active Problem List   Diagnosis Date Noted  . Anxiety and depression 11/19/2016  . Ischemic cardiomyopathy 06/30/2016   . Diabetes mellitus with complication ([place])   . Diabetes mellitus type 2 in obese ([place])   . Acute blood loss anemia   . Myoclonus   . Anoxic brain injury ([place])   . Seizures ([place])   . Dilated cardiomyopathy ([place]) 05/24/2016  . Coronary artery disease involving native coronary artery of native heart with angina pectoris ([place]) 05/24/2016  . Chronic combined systolic and diastolic CHF, NYHA class 2 and ACA/AHA stage C ([place]) 05/13/2016  . Status post  coronary artery stent placement   . Essential hypertension 09/21/2013  . Pulmonary nodules 10/16/2011    RAMSEUR,CHRIS, PTA 07/14/2017, 3:36 PM  [facility] [place], [place], [address] Phone: [phone]   Fax:  [phone]  Name: ENOS MUHL MRN: [id number] Date of Birth: [birth year]

## 2017-07-14 NOTE — Progress Notes (Signed)
Remote ICD transmission.   

## 2017-07-15 ENCOUNTER — Encounter: Payer: Self-pay | Admitting: Cardiology

## 2017-07-16 ENCOUNTER — Ambulatory Visit: Payer: BC Managed Care – PPO | Admitting: Physical Therapy

## 2017-07-16 DIAGNOSIS — R2681 Unsteadiness on feet: Secondary | ICD-10-CM

## 2017-07-16 DIAGNOSIS — R262 Difficulty in walking, not elsewhere classified: Secondary | ICD-10-CM

## 2017-07-16 LAB — CUP PACEART REMOTE DEVICE CHECK
Battery Remaining Longevity: 133 mo
Battery Voltage: 3.06 V
Brady Statistic RV Percent Paced: 0.01 %
HIGH POWER IMPEDANCE MEASURED VALUE: 80 Ohm
Lead Channel Impedance Value: 304 Ohm
Lead Channel Impedance Value: 399 Ohm
Lead Channel Pacing Threshold Amplitude: 1.125 V
Lead Channel Sensing Intrinsic Amplitude: 10.875 mV
Lead Channel Setting Pacing Amplitude: 2.5 V
Lead Channel Setting Sensing Sensitivity: 0.3 mV
MDC IDC LEAD IMPLANT DT: 20180226
MDC IDC LEAD LOCATION: 753860
MDC IDC MSMT LEADCHNL RV PACING THRESHOLD PULSEWIDTH: 0.4 ms
MDC IDC MSMT LEADCHNL RV SENSING INTR AMPL: 10.875 mV
MDC IDC PG IMPLANT DT: 20180226
MDC IDC SESS DTM: 20190312052306
MDC IDC SET LEADCHNL RV PACING PULSEWIDTH: 0.4 ms

## 2017-07-16 NOTE — Therapy (Signed)
[facility] [address] [place], [place], [address] Phone: [phone]   Fax:  [phone]  Physical Therapy Treatment  Patient Details  Name: DEVAUNTE GASPARINI MRN: [id number] Date of Birth: March 20, 1966 Referring Provider: Ivin Booty [name] MD   Encounter Date: 07/16/2017  PT End of Session - 07/16/17 1448    Visit Number  10    Number of Visits  12    Date for PT Re-Evaluation  07/22/17    PT Start Time  0234    Activity Tolerance  Patient tolerated treatment well    Behavior During Therapy  [facility] for tasks assessed/performed       Past Medical History:  Diagnosis Date  . Acute ST elevation myocardial infarction (STEMI) involving left circumflex coronary artery (HCC) 05/07/2016   PCI to Cx-OM  . Anginal pain (HCC)    secondary to sm. vessel disease  . Anxiety   . Arthritis    BIL KNEE PAIN AND BIL ANKLE PAIN  . Bone spur of ankle   . Cardiac arrest (HCC) 05/24/2016   with v fib  . 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 Coronary artery disease involving native coronary artery of native heart with angina pectoris (HCC) 05/24/2016   Remote MI at 52 years of age, Last cath 2010-diffuse non-obstructive disease; last echo 06/16/08 -normal LV function, moderate concentric hypertrophy; nuc 08/2008 no ischemia;  medical therapy; STEMI May 07 2016 - PCI to Cx-OM  . Diabetes mellitus    ON ORAL MEDICATION AND INSULIN  . Dilated cardiomyopathy (HCC) 05/24/2016   EF 325-30% by Echo post STEMI (previously 30-35%)   . Hyperlipidemia   . Hypertension   . Myocardial infarction (HCC) 1996   Post MI  . Peripheral vascular disease (HCC)    HAS LEFT CAROTID ARTERY STENOSIS   AND IS S/P RIGHT CAROTID ENDARTERECTOMY 2010 Last carotid dopplers 01/08/2012 wth patent endarterectomy site  . Status post coronary artery stent placement     Past Surgical History:  Procedure Laterality Date  .  APPENDECTOMY    . CARDIAC CATHETERIZATION     FEB 2010, significant branch vessel disease wth diag, marginal, PDA & PLA, nml. LV function  . CARDIAC CATHETERIZATION N/A 05/07/2016   Procedure: Left Heart Cath and Coronary Angiography;  Surgeon: Peter M Swaziland, MD;  Location: [facility] INVASIVE CV LAB;  Service: Cardiovascular;  Laterality: N/A;  . CARDIAC CATHETERIZATION N/A 05/07/2016   Procedure: Coronary Stent Intervention;  Surgeon: Peter M Swaziland, MD;  Location: [facility] INVASIVE CV LAB;  Service: Cardiovascular;  Laterality: N/A;  . CARDIAC CATHETERIZATION N/A 05/24/2016   Procedure: Left Heart Cath and Coronary Angiography;  Surgeon: Marykay Lex, MD;  Location: [facility] INVASIVE CV LAB;  Service: Cardiovascular;  Laterality: N/A;  . CARDIAC CATHETERIZATION N/A 05/24/2016   Procedure: Coronary Stent Intervention;  Surgeon: Marykay Lex, MD;  Location: [facility] INVASIVE CV LAB;  Service: Cardiovascular;  Laterality: N/A;  2.5x20 Promus to Ostial/proximal circumflex  . CAROTID ENDARTERECTOMY  09/2008   Rt CEA  . ICD IMPLANT N/A 06/30/2016   Procedure: ICD Implant;  Surgeon: Will Jorja Loa, MD;  Location: [facility] INVASIVE CV LAB;  Service: Cardiovascular;  Laterality: N/A;  . LUNG BIOPSY  2013   Bx's suggest granulomatous dz.  . RT ANKLE   2013    There were no vitals filed for this visit.  [facility] Adult PT Treatment/Exercise - 07/16/17 0001      Exercises   Exercises  Knee/Hip      Lumbar Exercises: Aerobic   Nustep  Level 5 x 15 minutes.      Lumbar Exercises: Machines for Strengthening   Cybex Lumbar Extension  10# x 4 minutes.    Cybex Knee Extension  30# x 4 minutes          Balance Exercises - 07/16/17 1502      Balance Exercises: Standing   Other Standing Exercises  Rockerboard forward and back 3 minutes and side to side x 3 minutes and inverted BOSU x 3 minutes.f/b  resisted walking with pink XTS band 3 minutes forward and 3 minutes backward              PT Long Term Goals - 07/14/17 1513      PT LONG TERM GOAL #3   Title  Walk a community distance without an assisitve device.    Baseline  Patient has to stop around 600 feet/5 min due to hip pain    Time  6    Period  Weeks    Status  Achieved            Plan - 07/16/17 1523    Clinical Impression Statement  Excellent job today with advanced strengthening and balance activities.  LOB only x 1 today with excellent balance recover strategy.    Consulted and Agree with Plan of Care  Patient       Patient will benefit from skilled therapeutic intervention in order to improve the following deficits and impairments:     Visit Diagnosis: Unsteadiness on feet  Difficulty walking     Problem List Patient Active Problem List   Diagnosis Date Noted  . Anxiety and depression 11/19/2016  . Ischemic cardiomyopathy 06/30/2016  . Diabetes mellitus with complication (HCC)   . Diabetes mellitus type 2 in obese (HCC)   . Acute blood loss anemia   . Myoclonus   . Anoxic brain injury (HCC)   . Seizures (HCC)   . Dilated cardiomyopathy (HCC) 05/24/2016  . Coronary artery disease involving native coronary artery of native heart with angina pectoris (HCC) 05/24/2016  . 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 Status post coronary artery stent placement   . Essential hypertension 09/21/2013  . Pulmonary nodules 10/16/2011    Ionna Avis, [place] MPT 07/16/2017, 3:25 PM  [facility] [address] [place], [place], [address] Phone: [phone]   Fax:  [phone]  Name: Margarita SermonsClifford M Gaydos MRN: [id number] Date of Birth: Jan 22, 1966

## 2017-07-21 ENCOUNTER — Encounter: Payer: Self-pay | Admitting: Physical Therapy

## 2017-07-21 ENCOUNTER — Ambulatory Visit: Payer: BC Managed Care – PPO | Admitting: Physical Therapy

## 2017-07-21 DIAGNOSIS — R2681 Unsteadiness on feet: Secondary | ICD-10-CM | POA: Diagnosis not present

## 2017-07-21 NOTE — Therapy (Signed)
[facility] [address] [place], [place], [address] Phone: [phone]   Fax:  [phone]  Physical Therapy Treatment  Patient Details  Name: Douglas SermonsClifford M Logie MRN: [id number] Date of Birth: [birth year] Referring Provider: Ivin BootyJoshua [name] MD   Encounter Date: 07/21/2017  PT End of Session - 07/21/17 1435    Visit Number  11    Number of Visits  12    Date for PT Re-Evaluation  07/22/17    PT Start Time  1433    PT Stop Time  1516    PT Time Calculation (min)  43 min    Activity Tolerance  Patient tolerated treatment well    Behavior During Therapy  [facility] for tasks assessed/performed       Past Medical History:  Diagnosis Date  . Acute ST elevation myocardial infarction (STEMI) involving left circumflex coronary artery (HCC) 05/07/2016   PCI to Cx-OM  . Anginal pain (HCC)    secondary to sm. vessel disease  . Anxiety   . Arthritis    BIL KNEE PAIN AND BIL ANKLE PAIN  . Bone spur of ankle   . Cardiac arrest (HCC) 05/24/2016   with v fib  . 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 Coronary artery disease involving native coronary artery of native heart with angina pectoris (HCC) 05/24/2016   Remote MI at 52 years of age, Last cath 2010-diffuse non-obstructive disease; last echo 06/16/08 -normal LV function, moderate concentric hypertrophy; nuc 08/2008 no ischemia;  medical therapy; STEMI May 07 2016 - PCI to Cx-OM  . Diabetes mellitus    ON ORAL MEDICATION AND INSULIN  . Dilated cardiomyopathy (HCC) 05/24/2016   EF 325-30% by Echo post STEMI (previously 30-35%)   . Hyperlipidemia   . Hypertension   . Myocardial infarction (HCC) 1996   Post MI  . Peripheral vascular disease (HCC)    HAS LEFT CAROTID ARTERY STENOSIS   AND IS S/P RIGHT CAROTID ENDARTERECTOMY 2010 Last carotid dopplers 01/08/2012 wth patent endarterectomy site  . Status post coronary artery stent placement      Past Surgical History:  Procedure Laterality Date  . APPENDECTOMY    . CARDIAC CATHETERIZATION     FEB 2010, significant branch vessel disease wth diag, marginal, PDA & PLA, nml. LV function  . CARDIAC CATHETERIZATION N/A 05/07/2016   Procedure: Left Heart Cath and Coronary Angiography;  Surgeon: Peter M SwazilandJordan, MD;  [facility];  Service: Cardiovascular;  Laterality: N/A;  . CARDIAC CATHETERIZATION N/A 05/07/2016   Procedure: Coronary Stent Intervention;  Surgeon: Peter M SwazilandJordan, MD;  [facility];  Service: Cardiovascular;  Laterality: N/A;  . CARDIAC CATHETERIZATION N/A 05/24/2016   Procedure: Left Heart Cath and Coronary Angiography;  Surgeon: Marykay Lexavid W Harding, MD;  [facility];  Service: Cardiovascular;  Laterality: N/A;  . CARDIAC CATHETERIZATION N/A 05/24/2016   Procedure: Coronary Stent Intervention;  Surgeon: Marykay Lexavid W Harding, MD;  [facility];  Service: Cardiovascular;  Laterality: N/A;  2.5x20 Promus to Ostial/proximal circumflex  . CAROTID ENDARTERECTOMY  09/2008   Rt CEA  . ICD IMPLANT N/A 06/30/2016   Procedure: ICD Implant;  Surgeon: Will Jorja LoaMartin Camnitz, MD;  [facility];  Service: Cardiovascular;  Laterality: N/A;  . LUNG BIOPSY  2013   Bx's suggest granulomatous dz.  . RT ANKLE   2013    There were no  vitals filed for this visit.  Subjective Assessment - 07/21/17 1434    Subjective  Reports that he feels "wonderful."    Pertinent History  MI, HTN, ICD implant.    Patient Stated Goals  Walk and not fall.    Currently in Pain?  No/denies         [facility] PT Assessment - 07/21/17 0001      Assessment   Medical Diagnosis  Fall.      Precautions   Precautions  [facility] Adult PT Treatment/Exercise - 07/21/17 0001      Standardized Balance Assessment   Standardized Balance Assessment  Berg Balance Test      Berg Balance Test   Sit to Stand  Able to stand  without using hands and stabilize independently    Standing Unsupported  Able to stand safely 2 minutes    Sitting with Back Unsupported but Feet Supported on Floor or Stool  Able to sit safely and securely 2 minutes    Stand to Sit  Sits safely with minimal use of hands    Transfers  Able to transfer safely, minor use of hands    Standing Unsupported with Eyes Closed  Able to stand 10 seconds safely    Standing Ubsupported with Feet Together  Able to place feet together independently and stand 1 minute safely    From Standing, Reach Forward with Outstretched Arm  Can reach forward >12 cm safely (5")    From Standing Position, Pick up Object from Floor  Able to pick up shoe safely and easily    From Standing Position, Turn to Look Behind Over each Shoulder  Looks behind from both sides and weight shifts well    Turn 360 Degrees  Able to turn 360 degrees safely in 4 seconds or less    Standing Unsupported, Alternately Place Feet on Step/Stool  Able to stand independently and complete 8 steps >20 seconds    Standing Unsupported, One Foot in Front  Able to plae foot ahead of the other independently and hold 30 seconds    Standing on One Leg  Able to lift leg independently and hold equal to or more than 3 seconds    Total Score  51      Knee/Hip Exercises: Aerobic   Nustep  L5 x15 min      Knee/Hip Exercises: Machines for Strengthening   Cybex Knee Extension  20# 3x10 reps    Cybex Knee Flexion  40# 3x10 reps          Balance Exercises - 07/21/17 1507      Balance Exercises: Standing   Standing Eyes Opened  Narrow base of support (BOS);Solid surface BUE reaching x10 reps each    SLS  Eyes open;Solid surface;Intermittent upper extremity support;3 reps;Time L 1. 17 s, 2. 23 s, 3. 1 m; R 1. 13 s, 2. 10 s, 3.16 s    Tandem Gait  Forward;Intermittent upper extremity support;4 reps    Other Standing Exercises  B forward lunge with 2# reachouts x10 reps each             PT Long Term  Goals - 07/21/17 1504      PT LONG TERM GOAL #1   Title  Independent with a HEP.    Time  6    Period  Weeks    Status  Achieved  PT LONG TERM GOAL #2   Title  Berg score= 53/56.    Period  Weeks    Status  On-going 51/56 07/21/2017      PT LONG TERM GOAL #3   Title  Walk a community distance without an assisitve device.    Baseline  Patient has to stop around 600 feet/5 min due to hip pain    Time  6    Period  Weeks    Status  Achieved            Plan - 07/21/17 1525    Clinical Impression Statement  Patient continues to progress well with PT and has shown improvement in regards to balance training. Today's BERG score 51/56 with patient limited with reaching as well as SLS and tandem stance. Appropriate anke strategy noted with all balance activities. Patient reported fatigue following end of treatment. Goals remain on-going for BERG score.    Clinical Impairments Affecting Rehab Potential  MI on 05/24/16, ICD implant, CVA.    PT Frequency  2x / week    PT Duration  8 weeks    PT Treatment/Interventions  ADLs/Self Care Home Management;Stair training;Gait training;Therapeutic activities;Therapeutic exercise;Balance training;Neuromuscular re-education;Patient/family education    PT Next Visit Plan  Balance and gait activites.  Please monitor BP and 02 sat. Complete BERG to reassess balance.    Consulted and Agree with Plan of Care  Patient       Patient will benefit from skilled therapeutic intervention in order to improve the following deficits and impairments:  Abnormal gait, Decreased activity tolerance, Decreased balance  Visit Diagnosis: Unsteadiness on feet     Problem List Patient Active Problem List   Diagnosis Date Noted  . Anxiety and depression 11/19/2016  . Ischemic cardiomyopathy 06/30/2016  . Diabetes mellitus with complication (HCC)   . Diabetes mellitus type 2 in obese (HCC)   . Acute blood loss anemia   . Myoclonus   . Anoxic brain injury  (HCC)   . Seizures (HCC)   . Dilated cardiomyopathy (HCC) 05/24/2016  . Coronary artery disease involving native coronary artery of native heart with angina pectoris (HCC) 05/24/2016  . 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 Status post coronary artery stent placement   . Essential hypertension 09/21/2013  . Pulmonary nodules 10/16/2011    Marvell Fuller, PTA 07/21/17 3:43 PM   [facility] [address] [place], [place], [address] Phone: [phone]   Fax:  [phone]  Name: BRICK KETCHER MRN: [id number] Date of Birth: December 24, 1965

## 2017-07-22 ENCOUNTER — Other Ambulatory Visit: Payer: Self-pay | Admitting: Family Medicine

## 2017-07-23 ENCOUNTER — Ambulatory Visit: Payer: BC Managed Care – PPO | Admitting: Physical Therapy

## 2017-07-23 ENCOUNTER — Encounter: Payer: Self-pay | Admitting: Physical Therapy

## 2017-07-23 DIAGNOSIS — R2681 Unsteadiness on feet: Secondary | ICD-10-CM

## 2017-07-23 NOTE — Therapy (Signed)
[facility] [address] [place], [place], [address] Phone: [phone]   Fax:  [phone]  Physical Therapy Treatment  Patient Details  Name: Douglas Carr MRN: [id number] Date of Birth: [birth year] Referring Provider: Ivin Booty [name] MD   Encounter Date: 07/23/2017  PT End of Session - 07/23/17 1436    Visit Number  12    Number of Visits  20    Date for PT Re-Evaluation  08/26/17    PT Start Time  1432    PT Stop Time  1513    PT Time Calculation (min)  41 min    Activity Tolerance  Patient tolerated treatment well    Behavior During Therapy  [facility] for tasks assessed/performed       Past Medical History:  Diagnosis Date  . Acute ST elevation myocardial infarction (STEMI) involving left circumflex coronary artery (HCC) 05/07/2016   PCI to Cx-OM  . Anginal pain (HCC)    secondary to sm. vessel disease  . Anxiety   . Arthritis    BIL KNEE PAIN AND BIL ANKLE PAIN  . Bone spur of ankle   . Cardiac arrest (HCC) 05/24/2016   with v fib  . 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 Coronary artery disease involving native coronary artery of native heart with angina pectoris (HCC) 05/24/2016   Remote MI at 52 years of age, Last cath 2010-diffuse non-obstructive disease; last echo 06/16/08 -normal LV function, moderate concentric hypertrophy; nuc 08/2008 no ischemia;  medical therapy; STEMI May 07 2016 - PCI to Cx-OM  . Diabetes mellitus    ON ORAL MEDICATION AND INSULIN  . Dilated cardiomyopathy (HCC) 05/24/2016   EF 325-30% by Echo post STEMI (previously 30-35%)   . Hyperlipidemia   . Hypertension   . Myocardial infarction (HCC) 1996   Post MI  . Peripheral vascular disease (HCC)    HAS LEFT CAROTID ARTERY STENOSIS   AND IS S/P RIGHT CAROTID ENDARTERECTOMY 2010 Last carotid dopplers 01/08/2012 wth patent endarterectomy site  . Status post coronary artery stent placement      Past Surgical History:  Procedure Laterality Date  . APPENDECTOMY    . CARDIAC CATHETERIZATION     FEB 2010, significant branch vessel disease wth diag, marginal, PDA & PLA, nml. LV function  . CARDIAC CATHETERIZATION N/A 05/07/2016   Procedure: Left Heart Cath and Coronary Angiography;  Surgeon: Peter M Swaziland, MD;  [facility] CV LAB;  Service: Cardiovascular;  Laterality: N/A;  . CARDIAC CATHETERIZATION N/A 05/07/2016   Procedure: Coronary Stent Intervention;  Surgeon: Peter M Swaziland, MD;  Location[facility] CV LAB;  Service: Cardiovascular;  Laterality: N/A;  . CARDIAC CATHETERIZATION N/A 05/24/2016   Procedure: Left Heart Cath and Coronary Angiography;  Surgeon: Marykay Lex, MD;  Location: [facility] CV LAB;  Service: Cardiovascular;  Laterality: N/A;  . CARDIAC CATHETERIZATION N/A 05/24/2016   Procedure: Coronary Stent Intervention;  Surgeon: Marykay Lex, MD;  Location: [facility] INVASIVE CV LAB;  Service: Cardiovascular;  Laterality: N/A;  2.5x20 Promus to Ostial/proximal circumflex  . CAROTID ENDARTERECTOMY  09/2008   Rt CEA  . ICD IMPLANT N/A 06/30/2016   Procedure: ICD Implant;  Surgeon: Will Jorja Loa, MD;  [facility]: [facility] INV[facility] CV LAB;  Service: Cardiovascular;  Laterality: N/A;  . LUNG BIOPSY  2013   Bx's suggest granulomatous dz.  . RT ANKLE   2013    There were no  vitals filed for this visit.  Subjective Assessment - 07/23/17 1435    Subjective  Reports he feels "good" and was excited for more visits that were signed by MD.    Pertinent History  MI, HTN, ICD implant.    Patient Stated Goals  Walk and not fall.    Currently in Pain?  No/denies         [facility] PT Assessment - 07/23/17 0001      Assessment   Medical Diagnosis  Fall.      Precautions   Precautions  Fall                  [facility] Adult PT Treatment/Exercise - 07/23/17 0001      Knee/Hip Exercises: Aerobic   Nustep  L1 x15 min      Knee/Hip Exercises: Machines for  Strengthening   Cybex Knee Extension  20# 3x10 reps    Cybex Knee Flexion  40# 3x10 reps          Balance Exercises - 07/23/17 1454      Balance Exercises: Standing   Standing Eyes Opened  Narrow base of support (BOS);Foam/compliant surface Reaching to wall with BUE from 31" from wall and 38"    SLS  Eyes open;Solid surface;Intermittent upper extremity support;Time;1 rep L 1. 50s,  R 1.4 s    Tandem Gait  Forward;Intermittent upper extremity support;4 reps    Cone Rotation  Solid surface;Right turn;Left turn 2 reps each from high to low and low to high    Other Standing Exercises  B forward lunge with 2# reachouts x15 reps each             PT Long Term Goals - 07/21/17 1504      PT LONG TERM GOAL #1   Title  Independent with a HEP.    Time  6    Period  Weeks    Status  Achieved      PT LONG TERM GOAL #2   Title  Berg score= 53/56.    Period  Weeks    Status  On-going 51/56 07/21/2017      PT LONG TERM GOAL #3   Title  Walk a community distance without an assisitve device.    Baseline  Patient has to stop around 600 feet/5 min due to hip pain    Time  6    Period  Weeks    Status  Achieved            Plan - 07/23/17 1520    Clinical Impression Statement  Patient tolerated today's treatment well with only fatigue reported. Patient guided through more advanced and functional exercises for balance activities. Patient demonstrated increased ankle strategy with all exercises on airex. Greatest difficulty was with cone rotation on floor from high <> low.     Clinical Impairments Affecting Rehab Potential  MI on 05/24/16, ICD implant, CVA.    PT Frequency  2x / week    PT Duration  8 weeks    PT Treatment/Interventions  ADLs/Self Care Home Management;Stair training;Gait training;Therapeutic activities;Therapeutic exercise;Balance training;Neuromuscular re-education;Patient/family education    PT Next Visit Plan  Balance and gait activites.      Consulted and Agree  with Plan of Care  Patient       Patient will benefit from skilled therapeutic intervention in order to improve the following deficits and impairments:  Abnormal gait, Decreased activity tolerance, Decreased balance  Visit Diagnosis: Unsteadiness on feet  Problem List Patient Active Problem List   Diagnosis Date Noted  . Anxiety and depression 11/19/2016  . Ischemic cardiomyopathy 06/30/2016  . Diabetes mellitus with complication (HCC)   . Diabetes mellitus type 2 in obese (HCC)   . Acute blood loss anemia   . Myoclonus   . Anoxic brain injury (HCC)   . Seizures (HCC)   . Dilated cardiomyopathy (HCC) 05/24/2016  . Coronary artery disease involving native coronary artery of native heart with angina pectoris (HCC) 05/24/2016  . 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 Status post coronary artery stent placement   . Essential hypertension 09/21/2013  . Pulmonary nodules 10/16/2011    Marvell Fuller, PTA 07/23/2017, 4:03 PM  [facility] [address] [place], [place], [address] Phone: [phone]   Fax:  [phone]  Name: Douglas Carr MRN: [id number] Date of Birth: [birth year]

## 2017-07-28 ENCOUNTER — Ambulatory Visit: Payer: BC Managed Care – PPO | Admitting: Physical Therapy

## 2017-07-28 ENCOUNTER — Encounter: Payer: Self-pay | Admitting: Physical Therapy

## 2017-07-28 DIAGNOSIS — M6281 Muscle weakness (generalized): Secondary | ICD-10-CM

## 2017-07-28 DIAGNOSIS — R2681 Unsteadiness on feet: Secondary | ICD-10-CM | POA: Diagnosis not present

## 2017-07-28 DIAGNOSIS — R262 Difficulty in walking, not elsewhere classified: Secondary | ICD-10-CM

## 2017-07-28 NOTE — Therapy (Signed)
[facility] [address] [place], [place], [address] Phone: [phone]   Fax:  [phone]  Physical Therapy Treatment  Patient Details  Name: Douglas Carr MRN: [id number] Date of Birth: [birth year] Referring Provider: Ivin Booty [name] MD   Encounter Date: 07/28/2017  PT End of Session - 07/28/17 1451    Visit Number  13    Number of Visits  20    Date for PT Re-Evaluation  08/26/17    PT Start Time  1435    PT Stop Time  1515    PT Time Calculation (min)  40 min    Activity Tolerance  Patient tolerated treatment well    Behavior During Therapy  [facility] for tasks assessed/performed       Past Medical History:  Diagnosis Date  . Acute ST elevation myocardial infarction (STEMI) involving left circumflex coronary artery (HCC) 05/07/2016   PCI to Cx-OM  . Anginal pain (HCC)    secondary to sm. vessel disease  . Anxiety   . Arthritis    BIL KNEE PAIN AND BIL ANKLE PAIN  . Bone spur of ankle   . Cardiac arrest (HCC) 05/24/2016   with v fib  . 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 Coronary artery disease involving native coronary artery of native heart with angina pectoris (HCC) 05/24/2016   Remote MI at 52 years of age, Last cath 2010-diffuse non-obstructive disease; last echo 06/16/08 -normal LV function, moderate concentric hypertrophy; nuc 08/2008 no ischemia;  medical therapy; STEMI May 07 2016 - PCI to Cx-OM  . Diabetes mellitus    ON ORAL MEDICATION AND INSULIN  . Dilated cardiomyopathy (HCC) 05/24/2016   EF 325-30% by Echo post STEMI (previously 30-35%)   . Hyperlipidemia   . Hypertension   . Myocardial infarction (HCC) 1996   Post MI  . Peripheral vascular disease (HCC)    HAS LEFT CAROTID ARTERY STENOSIS   AND IS S/P RIGHT CAROTID ENDARTERECTOMY 2010 Last carotid dopplers 01/08/2012 wth patent endarterectomy site  . Status post coronary artery stent placement      Past Surgical History:  Procedure Laterality Date  . APPENDECTOMY    . CARDIAC CATHETERIZATION     FEB 2010, significant branch vessel disease wth diag, marginal, PDA & PLA, nml. LV function  . CARDIAC CATHETERIZATION N/A 05/07/2016   Procedure: Left Heart Cath and Coronary Angiography;  Surgeon: Peter M Swaziland, MD;  Location: [facility] INVASIVE CV LAB;  Service: Cardiovascular;  Laterality: N/A;  . CARDIAC CATHETERIZATION N/A 05/07/2016   Procedure: Coronary Stent Intervention;  Surgeon: Peter M Swaziland, MD;  Location: [facility] INVASIVE CV LAB;  Service: Cardiovascular;  Laterality: N/A;  . CARDIAC CATHETERIZATION N/A 05/24/2016   Procedure: Left Heart Cath and Coronary Angiography;  Surgeon: Marykay Lex, MD;  Location: [facility] INVASIVE CV LAB;  Service: Cardiovascular;  Laterality: N/A;  . CARDIAC CATHETERIZATION N/A 05/24/2016   Procedure: Coronary Stent Intervention;  Surgeon: Marykay Lex, MD;  Location: [facility] INVASIVE CV LAB;  Service: Cardiovascular;  Laterality: N/A;  2.5x20 Promus to Ostial/proximal circumflex  . CAROTID ENDARTERECTOMY  09/2008   Rt CEA  . ICD IMPLANT N/A 06/30/2016   Procedure: ICD Implant;  Surgeon: Will Jorja Loa, MD;  Location: [facility] INVASIVE CV LAB;  Service: Cardiovascular;  Laterality: N/A;  . LUNG BIOPSY  2013   Bx's suggest granulomatous dz.  . RT ANKLE   2013    There were no  vitals filed for this visit.  Subjective Assessment - 07/28/17 1450    Subjective  No complaints upon arrival and denies any new falls.    Pertinent History  MI, HTN, ICD implant.    Patient Stated Goals  Walk and not fall.    Currently in Pain?  No/denies         [facility] PT Assessment - 07/28/17 0001      Assessment   Medical Diagnosis  Fall.      Precautions   Precautions  Fall            No data recorded       [facility] Adult PT Treatment/Exercise - 07/28/17 0001      Knee/Hip Exercises: Aerobic   Nustep  L6 x15 min      Knee/Hip Exercises: Machines for Strengthening    Cybex Knee Extension  20# 3x10 reps    Cybex Knee Flexion  40# 3x10 reps          Balance Exercises - 07/28/17 1510      Balance Exercises: Standing   Tandem Gait  Forward;Intermittent upper extremity support;Foam/compliant surface;5 reps    Sidestepping  Foam/compliant support;5 reps    Cone Rotation  Solid surface;R/L x2 RT    Other Standing Exercises  DLS BOSU x3 min             PT Long Term Goals - 07/21/17 1504      PT LONG TERM GOAL #1   Title  Independent with a HEP.    Time  6    Period  Weeks    Status  Achieved      PT LONG TERM GOAL #2   Title  Berg score= 53/56.    Period  Weeks    Status  On-going 51/56 07/21/2017      PT LONG TERM GOAL #3   Title  Walk a community distance without an assisitve device.    Baseline  Patient has to stop around 600 feet/5 min due to hip pain    Time  6    Period  Weeks    Status  Achieved            Plan - 07/28/17 1517    Clinical Impression Statement  Patient continues to tolerate treatment well with no complaints of pain during treatment or upon arrival. Patient continues to experience difficulty with long duration activities such as walking but reports improvement. Increased ankle strategies noted with beam activities especially tandem gait. Patient continues to report greatest difficulty being with cone rotation from computer desk height to low plinth on even surface.    Clinical Impairments Affecting Rehab Potential  MI on 05/24/16, ICD implant, CVA.    PT Frequency  2x / week    PT Duration  8 weeks    PT Treatment/Interventions  ADLs/Self Care Home Management;Stair training;Gait training;Therapeutic activities;Therapeutic exercise;Balance training;Neuromuscular re-education;Patient/family education    PT Next Visit Plan  Continue with more functional balance activities per MPT POC.    Consulted and Agree with Plan of Care  Patient       Patient will benefit from skilled therapeutic intervention in order  to improve the following deficits and impairments:  Abnormal gait, Decreased activity tolerance, Decreased balance  Visit Diagnosis: Unsteadiness on feet  Difficulty walking  Muscle weakness (generalized)     Problem List Patient Active Problem List   Diagnosis Date Noted  . Anxiety and depression 11/19/2016  . Ischemic cardiomyopathy 06/30/2016  .  Diabetes mellitus with complication (HCC)   . Diabetes mellitus type 2 in obese (HCC)   . Acute blood loss anemia   . Myoclonus   . Anoxic brain injury (HCC)   . Seizures (HCC)   . Dilated cardiomyopathy (HCC) 05/24/2016  . Coronary artery disease involving native coronary artery of native heart with angina pectoris (HCC) 05/24/2016  . 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 Status post coronary artery stent placement   . Essential hypertension 09/21/2013  . Pulmonary nodules 10/16/2011    Marvell FullerKelsey P Davian Wollenberg, PTA 07/28/2017, 3:20 PM  [facility] [address] [place], [place], [address] Phone: [phone]   Fax:  [phone]  Name: Douglas Carr MRN: [id number] Date of Birth: June 25, 1965

## 2017-07-30 ENCOUNTER — Encounter: Payer: Self-pay | Admitting: Physical Therapy

## 2017-07-30 ENCOUNTER — Ambulatory Visit: Payer: BC Managed Care – PPO | Admitting: Physical Therapy

## 2017-07-30 DIAGNOSIS — R262 Difficulty in walking, not elsewhere classified: Secondary | ICD-10-CM

## 2017-07-30 DIAGNOSIS — R2681 Unsteadiness on feet: Secondary | ICD-10-CM | POA: Diagnosis not present

## 2017-07-30 NOTE — Therapy (Signed)
[facility] [address] [place], [place], [address] Phone: [phone]   Fax:  [phone]  Physical Therapy Treatment  Patient Details  Name: KAIAN FAHS MRN: [id number] Date of Birth: 1965/11/15 Referring Provider: Ivin Booty [name] MD   Encounter Date: 07/30/2017  PT End of Session - 07/30/17 1443    Visit Number  14    Number of Visits  20    Date for PT Re-Evaluation  08/26/17    PT Start Time  1433    PT Stop Time  1514    PT Time Calculation (min)  41 min    Activity Tolerance  Patient tolerated treatment well    Behavior During Therapy  [facility] for tasks assessed/performed       Past Medical History:  Diagnosis Date  . Acute ST elevation myocardial infarction (STEMI) involving left circumflex coronary artery (HCC) 05/07/2016   PCI to Cx-OM  . Anginal pain (HCC)    secondary to sm. vessel disease  . Anxiety   . Arthritis    BIL KNEE PAIN AND BIL ANKLE PAIN  . Bone spur of ankle   . Cardiac arrest (HCC) 05/24/2016   with v fib  . 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 Coronary artery disease involving native coronary artery of native heart with angina pectoris (HCC) 05/24/2016   Remote MI at 52 years of age, Last cath 2010-diffuse non-obstructive disease; last echo 06/16/08 -normal LV function, moderate concentric hypertrophy; nuc 08/2008 no ischemia;  medical therapy; STEMI May 07 2016 - PCI to Cx-OM  . Diabetes mellitus    ON ORAL MEDICATION AND INSULIN  . Dilated cardiomyopathy (HCC) 05/24/2016   EF 325-30% by Echo post STEMI (previously 30-35%)   . Hyperlipidemia   . Hypertension   . Myocardial infarction (HCC) 1996   Post MI  . Peripheral vascular disease (HCC)    HAS LEFT CAROTID ARTERY STENOSIS   AND IS S/P RIGHT CAROTID ENDARTERECTOMY 2010 Last carotid dopplers 01/08/2012 wth patent endarterectomy site  . Status post coronary artery stent placement      Past Surgical History:  Procedure Laterality Date  . APPENDECTOMY    . CARDIAC CATHETERIZATION     FEB 2010, significant branch vessel disease wth diag, marginal, PDA & PLA, nml. LV function  . CARDIAC CATHETERIZATION N/A 05/07/2016   Procedure: Left Heart Cath and Coronary Angiography;  Surgeon: Peter M Swaziland, MD;  Location: [facility] INVASIVE CV LAB;  Service: Cardiovascular;  Laterality: N/A;  . CARDIAC CATHETERIZATION N/A 05/07/2016   Procedure: Coronary Stent Intervention;  Surgeon: Peter M Swaziland, MD;  Location: [facility] INVASIVE CV LAB;  Service: Cardiovascular;  Laterality: N/A;  . CARDIAC CATHETERIZATION N/A 05/24/2016   Procedure: Left Heart Cath and Coronary Angiography;  Surgeon: Marykay Lex, MD;  Location: [facility] INVASIVE CV LAB;  Service: Cardiovascular;  Laterality: N/A;  . CARDIAC CATHETERIZATION N/A 05/24/2016   Procedure: Coronary Stent Intervention;  Surgeon: Marykay Lex, MD;  Location: [facility] INVASIVE CV LAB;  Service: Cardiovascular;  Laterality: N/A;  2.5x20 Promus to Ostial/proximal circumflex  . CAROTID ENDARTERECTOMY  09/2008   Rt CEA  . ICD IMPLANT N/A 06/30/2016   Procedure: ICD Implant;  Surgeon: Will Jorja Loa, MD;  Location: [facility] INVASIVE CV LAB;  Service: Cardiovascular;  Laterality: N/A;  . LUNG BIOPSY  2013   Bx's suggest granulomatous dz.  . RT ANKLE   2013    There were no  vitals filed for this visit.  Subjective Assessment - 07/30/17 1443    Subjective  Patient reports feeling tired upon arrival but woke up earlier than he was used to to get his grandson ready for daycare and was unable to get a nap today.     Pertinent History  MI, HTN, ICD implant.    Patient Stated Goals  Walk and not fall.    Currently in Pain?  No/denies         [facility] PT Assessment - 07/30/17 0001      Assessment   Medical Diagnosis  Fall.      Precautions   Precautions  Fall            No data recorded       [facility] Adult PT Treatment/Exercise - 07/30/17 0001       Knee/Hip Exercises: Aerobic   Nustep  L6 x15 min      Knee/Hip Exercises: Machines for Strengthening   Cybex Knee Extension  20# 2x20 reps    Cybex Knee Flexion  40# 2x20 reps      Knee/Hip Exercises: Standing   Functional Squat  Other (comment) squat to low plinth with 5# kettlebell x17 reps          Balance Exercises - 07/30/17 1519      Balance Exercises: Standing   SLS  Eyes open;Solid surface;2 reps BLE SLS forward and sideways balance pods    Tandem Gait  Forward;Intermittent upper extremity support;Foam/compliant surface;5 reps    Sidestepping  Foam/compliant support;5 reps    Cone Rotation  Solid surface;R/L high to low table x2 reps             PT Long Term Goals - 07/21/17 1504      PT LONG TERM GOAL #1   Title  Independent with a HEP.    Time  6    Period  Weeks    Status  Achieved      PT LONG TERM GOAL #2   Title  Berg score= 53/56.    Period  Weeks    Status  On-going 51/56 07/21/2017      PT LONG TERM GOAL #3   Title  Walk a community distance without an assisitve device.    Baseline  Patient has to stop around 600 feet/5 min due to hip pain    Time  6    Period  Weeks    Status  Achieved            Plan - 07/30/17 1608    Clinical Impression Statement  Patient tolerated today's treatment fairly well but patient more fatigued than normal. Required more quick rests breaks during treatment especially with standing balance activities. Intermittant UE support utilized throughout balance activities today. Patient required a seated rest break during balance due to dizziness reports. Patient educated to rest as long as he needed. Dizziness subsided and patient was able to continue PT treatment.    Clinical Impairments Affecting Rehab Potential  MI on 05/24/16, ICD implant, CVA.    PT Frequency  2x / week    PT Duration  8 weeks    PT Treatment/Interventions  ADLs/Self Care Home Management;Stair training;Gait training;Therapeutic  activities;Therapeutic exercise;Balance training;Neuromuscular re-education;Patient/family education    PT Next Visit Plan  Continue with more functional balance activities per MPT POC.    Consulted and Agree with Plan of Care  Patient       Patient will benefit from skilled therapeutic intervention  in order to improve the following deficits and impairments:  Abnormal gait, Decreased activity tolerance, Decreased balance  Visit Diagnosis: Unsteadiness on feet  Difficulty walking     Problem List Patient Active Problem List   Diagnosis Date Noted  . Anxiety and depression 11/19/2016  . Ischemic cardiomyopathy 06/30/2016  . Diabetes mellitus with complication (HCC)   . Diabetes mellitus type 2 in obese (HCC)   . Acute blood loss anemia   . Myoclonus   . Anoxic brain injury (HCC)   . Seizures (HCC)   . Dilated cardiomyopathy (HCC) 05/24/2016  . Coronary artery disease involving native coronary artery of native heart with angina pectoris (HCC) 05/24/2016  . 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 Status post coronary artery stent placement   . Essential hypertension 09/21/2013  . Pulmonary nodules 10/16/2011    Marvell FullerKelsey P Ashling Roane, PTA 07/30/2017, 4:11 PM  [facility] [address] [place], [place], [address] Phone: [phone]   Fax:  [phone]  Name: Margarita SermonsClifford M Soderberg MRN: [id number] Date of Birth: 10-03-1965

## 2017-08-04 ENCOUNTER — Encounter: Payer: BC Managed Care – PPO | Admitting: Physical Therapy

## 2017-08-06 ENCOUNTER — Encounter: Payer: BC Managed Care – PPO | Admitting: Physical Therapy

## 2017-08-07 DIAGNOSIS — Z0289 Encounter for other administrative examinations: Secondary | ICD-10-CM

## 2017-08-10 ENCOUNTER — Other Ambulatory Visit: Payer: Self-pay | Admitting: Family Medicine

## 2017-08-11 ENCOUNTER — Ambulatory Visit: Payer: BC Managed Care – PPO | Attending: Family Medicine | Admitting: Physical Therapy

## 2017-08-11 DIAGNOSIS — M6281 Muscle weakness (generalized): Secondary | ICD-10-CM | POA: Diagnosis present

## 2017-08-11 DIAGNOSIS — R2681 Unsteadiness on feet: Secondary | ICD-10-CM | POA: Diagnosis present

## 2017-08-11 DIAGNOSIS — R262 Difficulty in walking, not elsewhere classified: Secondary | ICD-10-CM | POA: Insufficient documentation

## 2017-08-11 NOTE — Telephone Encounter (Signed)
Last seen 05/29/17  Dr [name]

## 2017-08-11 NOTE — Therapy (Signed)
[facility] [address] [place], [place], [address] Phone: [phone]   Fax:  [phone]  Physical Therapy Treatment  Patient Details  Name: Douglas Carr MRN: [id number] Date of Birth: 01-Apr-1966 Referring Provider: Ivin Booty [name] MD   Encounter Date: 08/11/2017  PT End of Session - 08/11/17 1429    Visit Number  15    Number of Visits  20    Date for PT Re-Evaluation  08/26/17    PT Start Time  1429    PT Stop Time  1509 2 units secondary to rest breaks and stopping treatment secondary to SOB    PT Time Calculation (min)  40 min    Activity Tolerance  Patient limited by fatigue    Behavior During Therapy  [facility] for tasks assessed/performed       Past Medical History:  Diagnosis Date  . Acute ST elevation myocardial infarction (STEMI) involving left circumflex coronary artery (HCC) 05/07/2016   PCI to Cx-OM  . Anginal pain (HCC)    secondary to sm. vessel disease  . Anxiety   . Arthritis    BIL KNEE PAIN AND BIL ANKLE PAIN  . Bone spur of ankle   . Cardiac arrest (HCC) 05/24/2016   with v fib  . 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 Coronary artery disease involving native coronary artery of native heart with angina pectoris (HCC) 05/24/2016   Remote MI at 52 years of age, Last cath 2010-diffuse non-obstructive disease; last echo 06/16/08 -normal LV function, moderate concentric hypertrophy; nuc 08/2008 no ischemia;  medical therapy; STEMI May 07 2016 - PCI to Cx-OM  . Diabetes mellitus    ON ORAL MEDICATION AND INSULIN  . Dilated cardiomyopathy (HCC) 05/24/2016   EF 325-30% by Echo post STEMI (previously 30-35%)   . Hyperlipidemia   . Hypertension   . Myocardial infarction (HCC) 1996   Post MI  . Peripheral vascular disease (HCC)    HAS LEFT CAROTID ARTERY STENOSIS   AND IS S/P RIGHT CAROTID ENDARTERECTOMY 2010 Last carotid dopplers 01/08/2012 wth patent  endarterectomy site  . Status post coronary artery stent placement     Past Surgical History:  Procedure Laterality Date  . APPENDECTOMY    . CARDIAC CATHETERIZATION     FEB 2010, significant branch vessel disease wth diag, marginal, PDA & PLA, nml. LV function  . CARDIAC CATHETERIZATION N/A 05/07/2016   Procedure: Left Heart Cath and Coronary Angiography;  Surgeon: Peter M Swaziland, MD;  Location: [facility] INVASIVE CV LAB;  Service: Cardiovascular;  Laterality: N/A;  . CARDIAC CATHETERIZATION N/A 05/07/2016   Procedure: Coronary Stent Intervention;  Surgeon: Peter M Swaziland, MD;  Location: [facility] INVASIVE CV LAB;  Service: Cardiovascular;  Laterality: N/A;  . CARDIAC CATHETERIZATION N/A 05/24/2016   Procedure: Left Heart Cath and Coronary Angiography;  Surgeon: Marykay Lex, MD;  Location: [facility] INVASIVE CV LAB;  Service: Cardiovascular;  Laterality: N/A;  . CARDIAC CATHETERIZATION N/A 05/24/2016   Procedure: Coronary Stent Intervention;  Surgeon: Marykay Lex, MD;  Location: [facility] INVASIVE CV LAB;  Service: Cardiovascular;  Laterality: N/A;  2.5x20 Promus to Ostial/proximal circumflex  . CAROTID ENDARTERECTOMY  09/2008   Rt CEA  . ICD IMPLANT N/A 06/30/2016   Procedure: ICD Implant;  Surgeon: Will Jorja Loa, MD;  Location: [facility] INVASIVE CV LAB;  Service: Cardiovascular;  Laterality: N/A;  . LUNG BIOPSY  2013   Bx's suggest granulomatous dz.  Marland Kitchen  RT ANKLE   2013    There were no vitals filed for this visit.  Subjective Assessment - 08/11/17 1520    Subjective  Reported feeling weak upon waking this morning and was very fatigued after showering today.    Pertinent History  MI, HTN, ICD implant.    Patient Stated Goals  Walk and not fall.    Currently in Pain?  Other (Comment) No pain assessment provided by patient          [facility] PT Assessment - 08/11/17 0001      Assessment   Medical Diagnosis  Fall.      Precautions   Precautions  Fall                   [facility] Adult PT  Treatment/Exercise - 08/11/17 0001      Knee/Hip Exercises: Aerobic   Nustep  L6 x15 min      Knee/Hip Exercises: Machines for Strengthening   Cybex Knee Extension  20# 2x20 reps    Cybex Knee Flexion  50# 2x20 reps      Knee/Hip Exercises: Seated   Sit to Sand  15 reps;without UE support 5# [id number]          Balance Exercises - 08/11/17 1511      Balance Exercises: Standing   Standing Eyes Opened  Wide (BOA);Foam/compliant surface DLS on horizontal beam reaching x10 reps    SLS  Eyes open;Foam/compliant surface;Upper extremity support 2;Intermittent upper extremity support x5 RLE, x2 LLE    Marching Limitations  BLE 2x10 reps, alternating to 8" step x20 reps             PT Long Term Goals - 07/21/17 1504      PT LONG TERM GOAL #1   Title  Independent with a HEP.    Time  6    Period  Weeks    Status  Achieved      PT LONG TERM GOAL #2   Title  Berg score= 53/56.    Period  Weeks    Status  On-going 51/56 07/21/2017      PT LONG TERM GOAL #3   Title  Walk a community distance without an assisitve device.    Baseline  Patient has to stop around 600 feet/5 min due to hip pain    Time  6    Period  Weeks    Status  Achieved            Plan - 08/11/17 1513    Clinical Impression Statement  Patient arrived today with reports of SOB and weakness as he has been sick. Patient required more rest breaks today and for longer time secondary to weakness. After each balance activities patient required rest breaks. Patient requested rest break following 2 reps of LLE for balance pods and upon sitting appeared SOB and heavily breathing. Vitals monitored with 88% O2 sat and 102 bpm. Pursed lip breathing instructed by PTA and following several minutes of pursed lip breathing O2 sat rose to 94% and 74 bpm. PT treatment ended secondary to patient feeling weak and to not over fatigue him.    Clinical Impairments Affecting Rehab Potential  MI on 05/24/16, ICD implant, CVA.     PT Frequency  2x / week    PT Duration  8 weeks    PT Treatment/Interventions  ADLs/Self Care Home Management;Stair training;Gait training;Therapeutic activities;Therapeutic exercise;Balance training;Neuromuscular re-education;Patient/family education    PT Next Visit Plan  Continue  with more functional balance activities per MPT POC.    Consulted and Agree with Plan of Care  Patient       Patient will benefit from skilled therapeutic intervention in order to improve the following deficits and impairments:  Abnormal gait, Decreased activity tolerance, Decreased balance  Visit Diagnosis: Unsteadiness on feet     Problem List Patient Active Problem List   Diagnosis Date Noted  . Anxiety and depression 11/19/2016  . Ischemic cardiomyopathy 06/30/2016  . Diabetes mellitus with complication (HCC)   . Diabetes mellitus type 2 in obese (HCC)   . Acute blood loss anemia   . Myoclonus   . Anoxic brain injury (HCC)   . Seizures (HCC)   . Dilated cardiomyopathy (HCC) 05/24/2016  . Coronary artery disease involving native coronary artery of native heart with angina pectoris (HCC) 05/24/2016  . 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 Status post coronary artery stent placement   . Essential hypertension 09/21/2013  . Pulmonary nodules 10/16/2011    Marvell Fuller, PTA 08/11/2017, 3:20 PM  [facility] [address] [place], [place], [address] Phone: [phone]   Fax:  [phone]  Name: Douglas Carr MRN: [id number] Date of Birth: 08-Sep-1965

## 2017-08-13 ENCOUNTER — Ambulatory Visit: Payer: BC Managed Care – PPO | Admitting: *Deleted

## 2017-08-13 ENCOUNTER — Encounter: Payer: Self-pay | Admitting: *Deleted

## 2017-08-13 DIAGNOSIS — R2681 Unsteadiness on feet: Secondary | ICD-10-CM | POA: Diagnosis not present

## 2017-08-13 DIAGNOSIS — R262 Difficulty in walking, not elsewhere classified: Secondary | ICD-10-CM

## 2017-08-13 DIAGNOSIS — M6281 Muscle weakness (generalized): Secondary | ICD-10-CM

## 2017-08-13 NOTE — Therapy (Signed)
[facility] [address] [place], [place], [address] Phone: [phone]   Fax:  [phone]  Physical Therapy Treatment  Patient Details  Name: Douglas Carr MRN: [id number] Date of Birth: May 09, 1965 Referring Provider: Ivin Booty [name] MD   Encounter Date: 08/13/2017  PT End of Session - 08/13/17 1426    Visit Number  16    Number of Visits  21    Date for PT Re-Evaluation  08/26/17    PT Start Time  1345    PT Stop Time  1435    PT Time Calculation (min)  50 min       Past Medical History:  Diagnosis Date  . Acute ST elevation myocardial infarction (STEMI) involving left circumflex coronary artery (HCC) 05/07/2016   PCI to Cx-OM  . Anginal pain (HCC)    secondary to sm. vessel disease  . Anxiety   . Arthritis    BIL KNEE PAIN AND BIL ANKLE PAIN  . Bone spur of ankle   . Cardiac arrest (HCC) 05/24/2016   with v fib  . 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 Coronary artery disease involving native coronary artery of native heart with angina pectoris (HCC) 05/24/2016   Remote MI at 52 years of age, Last cath 2010-diffuse non-obstructive disease; last echo 06/16/08 -normal LV function, moderate concentric hypertrophy; nuc 08/2008 no ischemia;  medical therapy; STEMI May 07 2016 - PCI to Cx-OM  . Diabetes mellitus    ON ORAL MEDICATION AND INSULIN  . Dilated cardiomyopathy (HCC) 05/24/2016   EF 325-30% by Echo post STEMI (previously 30-35%)   . Hyperlipidemia   . Hypertension   . Myocardial infarction (HCC) 1996   Post MI  . Peripheral vascular disease (HCC)    HAS LEFT CAROTID ARTERY STENOSIS   AND IS S/P RIGHT CAROTID ENDARTERECTOMY 2010 Last carotid dopplers 01/08/2012 wth patent endarterectomy site  . Status post coronary artery stent placement     Past Surgical History:  Procedure Laterality Date  . APPENDECTOMY    . CARDIAC CATHETERIZATION     FEB 2010,  significant branch vessel disease wth diag, marginal, PDA & PLA, nml. LV function  . CARDIAC CATHETERIZATION N/A 05/07/2016   Procedure: Left Heart Cath and Coronary Angiography;  Surgeon: Peter M Swaziland, MD;  Location: [facility] INVASIVE CV LAB;  Service: Cardiovascular;  Laterality: N/A;  . CARDIAC CATHETERIZATION N/A 05/07/2016   Procedure: Coronary Stent Intervention;  Surgeon: Peter M Swaziland, MD;  Location: [facility] INVASIVE CV LAB;  Service: Cardiovascular;  Laterality: N/A;  . CARDIAC CATHETERIZATION N/A 05/24/2016   Procedure: Left Heart Cath and Coronary Angiography;  Surgeon: Marykay Lex, MD;  Location: [facility] INVASIVE CV LAB;  Service: Cardiovascular;  Laterality: N/A;  . CARDIAC CATHETERIZATION N/A 05/24/2016   Procedure: Coronary Stent Intervention;  Surgeon: Marykay Lex, MD;  Location: [facility] INVASIVE CV LAB;  Service: Cardiovascular;  Laterality: N/A;  2.5x20 Promus to Ostial/proximal circumflex  . CAROTID ENDARTERECTOMY  09/2008   Rt CEA  . ICD IMPLANT N/A 06/30/2016   Procedure: ICD Implant;  Surgeon: Will Jorja Loa, MD;  Location: [facility] INVASIVE CV LAB;  Service: Cardiovascular;  Laterality: N/A;  . LUNG BIOPSY  2013   Bx's suggest granulomatous dz.  . RT ANKLE   2013    There were no vitals filed for this visit.  [facility] Adult PT Treatment/Exercise - 08/13/17 0001      Knee/Hip Exercises: Aerobic   Nustep  L6 x15 min      Knee/Hip Exercises: Machines for Strengthening   Cybex Knee Extension  20# 3x20 reps    Cybex Knee Flexion  50# 3x20 reps      Knee/Hip Exercises: Seated   Sit to Sand  15 reps;without UE support;10 reps 15# kettle bell          Balance Exercises - 08/13/17 1422      Balance Exercises: Standing   Standing Eyes Opened  Wide (BOA);Foam/compliant surface    SLS  Eyes open;Foam/compliant surface;Upper extremity support 2;Intermittent upper extremity support x5 RLE, x2 LLE    Rockerboard  Anterior/posterior x5 mins    Marching  Limitations  BLE 2x10 reps, alternating to 8" step x20 reps    Other Standing Exercises  DLS BOSU x5 min             PT Long Term Goals - 07/21/17 1504      PT LONG TERM GOAL #1   Title  Independent with a HEP.    Time  6    Period  Weeks    Status  Achieved      PT LONG TERM GOAL #2   Title  Berg score= 53/56.    Period  Weeks    Status  On-going 51/56 07/21/2017      PT LONG TERM GOAL #3   Title  Walk a community distance without an assisitve device.    Baseline  Patient has to stop around 600 feet/5 min due to hip pain    Time  6    Period  Weeks    Status  Achieved            Plan - 08/13/17 1441    Clinical Impression Statement  Pt arrived today feeling fairly well. He had more energy and was able to complete all therex/ balance act,'s and continues to progress with balance and strengthening. No complaints of SOB today. No LOB with TOE taps today and less UE assistance with balance Act's    Clinical Impairments Affecting Rehab Potential  MI on 05/24/16, ICD implant, CVA.    PT Frequency  2x / week    PT Duration  8 weeks    PT Treatment/Interventions  ADLs/Self Care Home Management;Stair training;Gait training;Therapeutic activities;Therapeutic exercise;Balance training;Neuromuscular re-education;Patient/family education    PT Next Visit Plan  Continue with more functional balance activities per MPT POC.       Patient will benefit from skilled therapeutic intervention in order to improve the following deficits and impairments:  Abnormal gait, Decreased activity tolerance, Decreased balance  Visit Diagnosis: Unsteadiness on feet  Difficulty walking  Muscle weakness (generalized)     Problem List Patient Active Problem List   Diagnosis Date Noted  . Anxiety and depression 11/19/2016  . Ischemic cardiomyopathy 06/30/2016  . Diabetes mellitus with complication (HCC)   . Diabetes mellitus type 2 in obese (HCC)   . Acute blood loss anemia   . Myoclonus    . Anoxic brain injury (HCC)   . Seizures (HCC)   . Dilated cardiomyopathy (HCC) 05/24/2016  . Coronary artery disease involving native coronary artery of native heart with angina pectoris (HCC) 05/24/2016  . 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 Status post coronary artery stent placement   . Essential hypertension 09/21/2013  .  Pulmonary nodules 10/16/2011    Kazimierz Springborn,CHRIS, PTA 08/13/2017, 2:50 PM  [facility] [address] [name], [place], [address] Phone: [phone]   Fax:  [phone]  Name: Douglas Carr MRN: [id number] Date of Birth: 07-23-1965

## 2017-08-18 ENCOUNTER — Ambulatory Visit: Payer: BC Managed Care – PPO | Admitting: Physical Therapy

## 2017-08-18 ENCOUNTER — Encounter: Payer: Self-pay | Admitting: Physical Therapy

## 2017-08-18 DIAGNOSIS — R2681 Unsteadiness on feet: Secondary | ICD-10-CM | POA: Diagnosis not present

## 2017-08-18 DIAGNOSIS — R262 Difficulty in walking, not elsewhere classified: Secondary | ICD-10-CM

## 2017-08-18 NOTE — Therapy (Signed)
[facility] [address] [place], [place], [address] Phone: [phone]   Fax:  [phone]  Physical Therapy Treatment  Patient Details  Name: Douglas SermonsClifford M Carr MRN: [id number] Date of Birth: 12/28/1965 Referring Provider: Ivin BootyJoshua [name] MD   Encounter Date: 08/18/2017  PT End of Session - 08/18/17 1444    Visit Number  17    Number of Visits  21    Date for PT Re-Evaluation  08/26/17    PT Start Time  1442 2 units secondary to late arrival    PT Stop Time  1513    PT Time Calculation (min)  31 min    Activity Tolerance  Patient limited by fatigue    Behavior During Therapy  [facility] for tasks assessed/performed       Past Medical History:  Diagnosis Date  . Acute ST elevation myocardial infarction (STEMI) involving left circumflex coronary artery (HCC) 05/07/2016   PCI to Cx-OM  . Anginal pain (HCC)    secondary to sm. vessel disease  . Anxiety   . Arthritis    BIL KNEE PAIN AND BIL ANKLE PAIN  . Bone spur of ankle   . Cardiac arrest (HCC) 05/24/2016   with v fib  . 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 Coronary artery disease involving native coronary artery of native heart with angina pectoris (HCC) 05/24/2016   Remote MI at 52 years of age, Last cath 2010-diffuse non-obstructive disease; last echo 06/16/08 -normal LV function, moderate concentric hypertrophy; nuc 08/2008 no ischemia;  medical therapy; STEMI May 07 2016 - PCI to Cx-OM  . Diabetes mellitus    ON ORAL MEDICATION AND INSULIN  . Dilated cardiomyopathy (HCC) 05/24/2016   EF 325-30% by Echo post STEMI (previously 30-35%)   . Hyperlipidemia   . Hypertension   . Myocardial infarction (HCC) 1996   Post MI  . Peripheral vascular disease (HCC)    HAS LEFT CAROTID ARTERY STENOSIS   AND IS S/P RIGHT CAROTID ENDARTERECTOMY 2010 Last carotid dopplers 01/08/2012 wth patent endarterectomy site  . Status post coronary  artery stent placement     Past Surgical History:  Procedure Laterality Date  . APPENDECTOMY    . CARDIAC CATHETERIZATION     FEB 2010, significant branch vessel disease wth diag, marginal, PDA & PLA, nml. LV function  . CARDIAC CATHETERIZATION N/A 05/07/2016   Procedure: Left Heart Cath and Coronary Angiography;  Surgeon: Peter M SwazilandJordan, MD;  [facility];  Service: Cardiovascular;  Laterality: N/A;  . CARDIAC CATHETERIZATION N/A 05/07/2016   Procedure: Coronary Stent Intervention;  Surgeon: Peter M SwazilandJordan, MD;  [facility];  Service: Cardiovascular;  Laterality: N/A;  . CARDIAC CATHETERIZATION N/A 05/24/2016   Procedure: Left Heart Cath and Coronary Angiography;  Surgeon: Marykay Lexavid W Harding, MD;  [facility];  Service: Cardiovascular;  Laterality: N/A;  . CARDIAC CATHETERIZATION N/A 05/24/2016   Procedure: Coronary Stent Intervention;  Surgeon: Marykay Lexavid W Harding, MD;  [facility];  Service: Cardiovascular;  Laterality: N/A;  2.5x20 Promus to Ostial/proximal circumflex  . CAROTID ENDARTERECTOMY  09/2008   Rt CEA  . ICD IMPLANT N/A 06/30/2016   Procedure: ICD Implant;  Surgeon: Will Jorja LoaMartin Camnitz, MD;  [facility];  Service: Cardiovascular;  Laterality: N/A;  . LUNG BIOPSY  2013   Bx's suggest granulomatous dz.  . RT ANKLE   2013  There were no vitals filed for this visit.  Subjective Assessment - 08/18/17 1443    Subjective  Reports that he feels good and hasn't felt fatigued.    Pertinent History  MI, HTN, ICD implant.    Patient Stated Goals  Walk and not fall.    Currently in Pain?  No/denies                       [facility] Adult PT Treatment/Exercise - 08/18/17 0001      Knee/Hip Exercises: Aerobic   Nustep  L6 x15 min      Knee/Hip Exercises: Machines for Strengthening   Cybex Knee Extension  20# 3x20 reps    Cybex Knee Flexion  50# 3x20 reps      Knee/Hip Exercises: Seated   Sit to  Sand  15 reps;without UE support 14# box          Balance Exercises - 08/18/17 1510      Balance Exercises: Standing   Standing Eyes Opened  Narrow base of support (BOS);Foam/compliant surface;Head turns x2 min    Tandem Stance  Eyes open;Foam/compliant surface;Intermittent upper extremity support x2 min             PT Long Term Goals - 07/21/17 1504      PT LONG TERM GOAL #1   Title  Independent with a HEP.    Time  6    Period  Weeks    Status  Achieved      PT LONG TERM GOAL #2   Title  Berg score= 53/56.    Period  Weeks    Status  On-going 51/56 07/21/2017      PT LONG TERM GOAL #3   Title  Walk a community distance without an assisitve device.    Baseline  Patient has to stop around 600 feet/5 min due to hip pain    Time  6    Period  Weeks    Status  Achieved            Plan - 08/18/17 1523    Clinical Impression Statement  Patient tolerated today's treatment well although he fatigued by the end of treatment. Patient able to tolerate sit to stands with 14# box well only fatigue following end of exercise. Head turns were completed on airex as patient reports that LOB happens at times with head turns. Patient did well with head turns on uneven surface with even weight distribution and appropriate ankle strategies. Tandem stance on beam with distraction proved more difficult per ankle strategies noted.    Clinical Impairments Affecting Rehab Potential  MI on 05/24/16, ICD implant, CVA.    PT Frequency  2x / week    PT Duration  8 weeks    PT Treatment/Interventions  ADLs/Self Care Home Management;Stair training;Gait training;Therapeutic activities;Therapeutic exercise;Balance training;Neuromuscular re-education;Patient/family education    PT Next Visit Plan  Continue with more functional balance activities per MPT POC.    Consulted and Agree with Plan of Care  Patient       Patient will benefit from skilled therapeutic intervention in order to improve the  following deficits and impairments:  Abnormal gait, Decreased activity tolerance, Decreased balance  Visit Diagnosis: Unsteadiness on feet  Difficulty walking     Problem List Patient Active Problem List   Diagnosis Date Noted  . Anxiety and depression 11/19/2016  . Ischemic cardiomyopathy 06/30/2016  . Diabetes mellitus with complication (HCC)   . Diabetes mellitus  type 2 in obese (HCC)   . Acute blood loss anemia   . Myoclonus   . Anoxic brain injury (HCC)   . Seizures (HCC)   . Dilated cardiomyopathy (HCC) 05/24/2016  . Coronary artery disease involving native coronary artery of native heart with angina pectoris (HCC) 05/24/2016  . 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 Status post coronary artery stent placement   . Essential hypertension 09/21/2013  . Pulmonary nodules 10/16/2011    Marvell Fuller, PTA 08/18/2017, 3:33 PM  [facility] [address] [place], [place], [address] Phone: [phone]   Fax:  [phone]  Name: Douglas Carr MRN: [id number] Date of Birth: Oct 02, 1965

## 2017-08-24 ENCOUNTER — Other Ambulatory Visit: Payer: Self-pay | Admitting: Family Medicine

## 2017-08-24 DIAGNOSIS — F419 Anxiety disorder, unspecified: Secondary | ICD-10-CM

## 2017-08-24 DIAGNOSIS — F329 Major depressive disorder, single episode, unspecified: Secondary | ICD-10-CM

## 2017-08-24 DIAGNOSIS — F32A Depression, unspecified: Secondary | ICD-10-CM

## 2017-08-25 ENCOUNTER — Encounter: Payer: Self-pay | Admitting: Physical Therapy

## 2017-08-25 ENCOUNTER — Ambulatory Visit: Payer: BC Managed Care – PPO | Admitting: Physical Therapy

## 2017-08-25 DIAGNOSIS — R262 Difficulty in walking, not elsewhere classified: Secondary | ICD-10-CM

## 2017-08-25 DIAGNOSIS — R2681 Unsteadiness on feet: Secondary | ICD-10-CM

## 2017-08-25 NOTE — Therapy (Signed)
[facility] [address] [place], [place], [address] Phone: [phone]   Fax:  [phone]  Physical Therapy Treatment  Patient Details  Name: Douglas Carr MRN: [id number] Date of Birth: 1965-07-18 Referring Provider: Ivin BootyJoshua [name] MD   Encounter Date: 08/25/2017  PT End of Session - 08/25/17 1445    Visit Number  18    Number of Visits  21    Date for PT Re-Evaluation  08/26/17    PT Start Time  1430    PT Stop Time  1513    PT Time Calculation (min)  43 min    Activity Tolerance  Patient limited by fatigue    Behavior During Therapy  [facility] for tasks assessed/performed       Past Medical History:  Diagnosis Date  . Acute ST elevation myocardial infarction (STEMI) involving left circumflex coronary artery (HCC) 05/07/2016   PCI to Cx-OM  . Anginal pain (HCC)    secondary to sm. vessel disease  . Anxiety   . Arthritis    BIL KNEE PAIN AND BIL ANKLE PAIN  . Bone spur of ankle   . Cardiac arrest (HCC) 05/24/2016   with v fib  . 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 Coronary artery disease involving native coronary artery of native heart with angina pectoris (HCC) 05/24/2016   Remote MI at 52 years of age, Last cath 2010-diffuse non-obstructive disease; last echo 06/16/08 -normal LV function, moderate concentric hypertrophy; nuc 08/2008 no ischemia;  medical therapy; STEMI May 07 2016 - PCI to Cx-OM  . Diabetes mellitus    ON ORAL MEDICATION AND INSULIN  . Dilated cardiomyopathy (HCC) 05/24/2016   EF 325-30% by Echo post STEMI (previously 30-35%)   . Hyperlipidemia   . Hypertension   . Myocardial infarction (HCC) 1996   Post MI  . Peripheral vascular disease (HCC)    HAS LEFT CAROTID ARTERY STENOSIS   AND IS S/P RIGHT CAROTID ENDARTERECTOMY 2010 Last carotid dopplers 01/08/2012 wth patent endarterectomy site  . Status post coronary artery stent placement     Past  Surgical History:  Procedure Laterality Date  . APPENDECTOMY    . CARDIAC CATHETERIZATION     FEB 2010, significant branch vessel disease wth diag, marginal, PDA & PLA, nml. LV function  . CARDIAC CATHETERIZATION N/A 05/07/2016   Procedure: Left Heart Cath and Coronary Angiography;  Surgeon: Peter M SwazilandJordan, MD;  [facility];  Service: Cardiovascular;  Laterality: N/A;  . CARDIAC CATHETERIZATION N/A 05/07/2016   Procedure: Coronary Stent Intervention;  Surgeon: Peter M SwazilandJordan, MD;  [facility];  Service: Cardiovascular;  Laterality: N/A;  . CARDIAC CATHETERIZATION N/A 05/24/2016   Procedure: Left Heart Cath and Coronary Angiography;  Surgeon: Marykay Lexavid W Harding, MD;  [facility];  Service: Cardiovascular;  Laterality: N/A;  . CARDIAC CATHETERIZATION N/A 05/24/2016   Procedure: Coronary Stent Intervention;  Surgeon: Marykay Lexavid W Harding, MD;  [facility];  Service: Cardiovascular;  Laterality: N/A;  2.5x20 Promus to Ostial/proximal circumflex  . CAROTID ENDARTERECTOMY  09/2008   Rt CEA  . ICD IMPLANT N/A 06/30/2016   Procedure: ICD Implant;  Surgeon: Will Jorja LoaMartin Camnitz, MD;  [facility];  Service: Cardiovascular;  Laterality: N/A;  . LUNG BIOPSY  2013   Bx's suggest granulomatous dz.  . RT ANKLE   2013    There were no  vitals filed for this visit.  Subjective Assessment - 08/25/17 1434    Subjective  No new complaints.    Pertinent History  MI, HTN, ICD implant.    Patient Stated Goals  Walk and not fall.    Currently in Pain?  No/denies         [facility] PT Assessment - 08/25/17 0001      Assessment   Medical Diagnosis  Fall.      Precautions   Precautions  Fall                   [facility] Adult PT Treatment/Exercise - 08/25/17 0001      Knee/Hip Exercises: Aerobic   Nustep  L6 x15 min          Balance Exercises - 08/25/17 1526      Balance Exercises: Standing   Standing Eyes Opened  Narrow  base of support (BOS);Foam/compliant surface;Cognitive challenge clothespin reaching [place] x1 rep    Tandem Stance  Eyes open;Foam/compliant surface;Intermittent upper extremity support with head and trunk turns x3 min    Tandem Gait  Forward;Intermittent upper extremity support;Foam/compliant surface;Cognitive challenge cities in [place] and animals    Numbers 1-15  Foam/compliant surface    Cone Rotation  Foam/compliant surface;R/L x 2 reps             PT Long Term Goals - 07/21/17 1504      PT LONG TERM GOAL #1   Title  Independent with a HEP.    Time  6    Period  Weeks    Status  Achieved      PT LONG TERM GOAL #2   Title  Berg score= 53/56.    Period  Weeks    Status  On-going 51/56 07/21/2017      PT LONG TERM GOAL #3   Title  Walk a community distance without an assisitve device.    Baseline  Patient has to stop around 600 feet/5 min due to hip pain    Time  6    Period  Weeks    Status  Achieved            Plan - 08/25/17 1530    Clinical Impression Statement  Patient tolerated today's treatment fairly well as he denied anymore falls or stumbles at home. Patient very fatigued today and required more quick seated rest breaks between balance activities. Patient very challenged by cognitive challenges especially with tandem gait on beam. Increased ankle strategies noted with all balance activities.    Clinical Impairments Affecting Rehab Potential  MI on 05/24/16, ICD implant, CVA.    PT Frequency  2x / week    PT Duration  2 weeks    PT Treatment/Interventions  ADLs/Self Care Home Management;Stair training;Gait training;Therapeutic activities;Therapeutic exercise;Balance training;Neuromuscular re-education;Patient/family education    PT Next Visit Plan  Continue with more functional balance activities per MPT POC.    Consulted and Agree with Plan of Care  Patient       Patient will benefit from skilled therapeutic intervention in order to improve the following  deficits and impairments:  Abnormal gait, Decreased activity tolerance, Decreased balance  Visit Diagnosis: Unsteadiness on feet  Difficulty walking     Problem List Patient Active Problem List   Diagnosis Date Noted  . Anxiety and depression 11/19/2016  . Ischemic cardiomyopathy 06/30/2016  . Diabetes mellitus with complication (HCC)   . Diabetes mellitus type 2 in obese (HCC)   . Acute  blood loss anemia   . Myoclonus   . Anoxic brain injury (HCC)   . Seizures (HCC)   . Dilated cardiomyopathy (HCC) 05/24/2016  . Coronary artery disease involving native coronary artery of native heart with angina pectoris (HCC) 05/24/2016  . 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 Status post coronary artery stent placement   . Essential hypertension 09/21/2013  . Pulmonary nodules 10/16/2011    Marvell Fuller, PTA 08/25/2017, 3:36 PM  [facility] [address] [place], [place], [address] Phone: [phone]   Fax:  [phone]  Name: Douglas Carr MRN: [id number] Date of Birth: July 23, 1965

## 2017-08-27 ENCOUNTER — Ambulatory Visit: Payer: BC Managed Care – PPO | Admitting: *Deleted

## 2017-08-27 DIAGNOSIS — R2681 Unsteadiness on feet: Secondary | ICD-10-CM

## 2017-08-27 DIAGNOSIS — M6281 Muscle weakness (generalized): Secondary | ICD-10-CM

## 2017-08-27 DIAGNOSIS — R262 Difficulty in walking, not elsewhere classified: Secondary | ICD-10-CM

## 2017-08-27 NOTE — Therapy (Signed)
[facility] [address] [place], [place], [address] Phone: [phone]   Fax:  [phone]  Physical Therapy Treatment  Patient Details  Name: Douglas Carr MRN: [id number] Date of Birth: 07/25/1965 Referring Provider: Ivin Booty [name] MD   Encounter Date: 08/27/2017  PT End of Session - 08/27/17 1454    Visit Number  19    Number of Visits  20    Date for PT Re-Evaluation  08/26/17    PT Start Time  1440    PT Stop Time  1521    PT Time Calculation (min)  41 min       Past Medical History:  Diagnosis Date  . Acute ST elevation myocardial infarction (STEMI) involving left circumflex coronary artery (HCC) 05/07/2016   PCI to Cx-OM  . Anginal pain (HCC)    secondary to sm. vessel disease  . Anxiety   . Arthritis    BIL KNEE PAIN AND BIL ANKLE PAIN  . Bone spur of ankle   . Cardiac arrest (HCC) 05/24/2016   with v fib  . 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 Coronary artery disease involving native coronary artery of native heart with angina pectoris (HCC) 05/24/2016   Remote MI at 52 years of age, Last cath 2010-diffuse non-obstructive disease; last echo 06/16/08 -normal LV function, moderate concentric hypertrophy; nuc 08/2008 no ischemia;  medical therapy; STEMI May 07 2016 - PCI to Cx-OM  . Diabetes mellitus    ON ORAL MEDICATION AND INSULIN  . Dilated cardiomyopathy (HCC) 05/24/2016   EF 325-30% by Echo post STEMI (previously 30-35%)   . Hyperlipidemia   . Hypertension   . Myocardial infarction (HCC) 1996   Post MI  . Peripheral vascular disease (HCC)    HAS LEFT CAROTID ARTERY STENOSIS   AND IS S/P RIGHT CAROTID ENDARTERECTOMY 2010 Last carotid dopplers 01/08/2012 wth patent endarterectomy site  . Status post coronary artery stent placement     Past Surgical History:  Procedure Laterality Date  . APPENDECTOMY    . CARDIAC CATHETERIZATION     FEB 2010,  significant branch vessel disease wth diag, marginal, PDA & PLA, nml. LV function  . CARDIAC CATHETERIZATION N/A 05/07/2016   Procedure: Left Heart Cath and Coronary Angiography;  Surgeon: Peter M Swaziland, MD;  [facility] CV LAB;  Service: Cardiovascular;  Laterality: N/A;  . CARDIAC CATHETERIZATION N/A 05/07/2016   Procedure: Coronary Stent Intervention;  Surgeon: Peter M Swaziland, MD;  [facility] CV LAB;  Service: Cardiovascular;  Laterality: N/A;  . CARDIAC CATHETERIZATION N/A 05/24/2016   Procedure: Left Heart Cath and Coronary Angiography;  Surgeon: Marykay Lex, MD;  Location: [facility] INVAS[facility] CV LAB;  Service: Cardiovascular;  Laterality: N/A;  . CARDIAC CATHETERIZATION N/A 05/24/2016   Procedure: Coronary Stent Intervention;  Surgeon: Marykay Lex, MD;  [facility] INV[facility] CV LAB;  Service: Cardiovascular;  Laterality: N/A;  2.5x20 Promus to Ostial/proximal circumflex  . CAROTID ENDARTERECTOMY  09/2008   Rt CEA  . ICD IMPLANT N/A 06/30/2016   Procedure: ICD Implant;  Surgeon: Will Jorja Loa, MD;  [facility];  Service: Cardiovascular;  Laterality: N/A;  . LUNG BIOPSY  2013   Bx's suggest granulomatous dz.  . RT ANKLE   2013    There were no vitals filed for this visit.  Subjective Assessment - 08/27/17 1449    Subjective  I have had a good  week    Pertinent History  MI, HTN, ICD implant.    Patient Stated Goals  Walk and not fall.    Currently in Pain?  No/denies                       [facility] Adult PT Treatment/Exercise - 08/27/17 0001      Knee/Hip Exercises: Aerobic   Nustep  L6 x15 min      Knee/Hip Exercises: Machines for Strengthening   Cybex Knee Extension  20# 3x fatigue. decreased reps due to Elliptical    Cybex Knee Flexion  50# 3x20 reps     Elliptical x 5 mins L5, ramp 1     Balance Exercises - 08/27/17 1509      Balance Exercises: Standing   Standing Eyes Opened  Narrow base of support  (BOS);Foam/compliant surface;Cognitive challenge    Tandem Stance  Eyes open;Foam/compliant surface;Intermittent upper extremity support    Tandem Gait  Forward;Intermittent upper extremity support;Foam/compliant surface;Cognitive challenge cities in [place] and animals    Numbers 1-15  Foam/compliant surface             PT Long Term Goals - 07/21/17 1504      PT LONG TERM GOAL #1   Title  Independent with a HEP.    Time  6    Period  Weeks    Status  Achieved      PT LONG TERM GOAL #2   Title  Berg score= 53/56.    Period  Weeks    Status  On-going 51/56 07/21/2017      PT LONG TERM GOAL #3   Title  Walk a community distance without an assisitve device.    Baseline  Patient has to stop around 600 feet/5 min due to hip pain    Time  6    Period  Weeks    Status  Achieved            Plan - 08/27/17 1523    Clinical Impression Statement  Pt arrived 10 mins late today, but feeling fairly well. He performed 5 mins on the elliptical today and reports that his quads were very fatigued. He was able to complete all therex for balance and condiitiong.  Pt performed better today with cognitive challenges    Clinical Presentation  Stable    Clinical Decision Making  Low    Clinical Impairments Affecting Rehab Potential  MI on 05/24/16, ICD implant, CVA.    PT Frequency  2x / week    PT Duration  2 weeks    PT Treatment/Interventions  ADLs/Self Care Home Management;Stair training;Gait training;Therapeutic activities;Therapeutic exercise;Balance training;Neuromuscular re-education;Patient/family education    PT Next Visit Plan  Continue with more functional balance activities per MPT POC.    DC next visit    Consulted and Agree with Plan of Care  Patient       Patient will benefit from skilled therapeutic intervention in order to improve the following deficits and impairments:  Abnormal gait, Decreased activity tolerance, Decreased balance  Visit Diagnosis: Unsteadiness on  feet  Difficulty walking  Muscle weakness (generalized)     Problem List Patient Active Problem List   Diagnosis Date Noted  . Anxiety and depression 11/19/2016  . Ischemic cardiomyopathy 06/30/2016  . Diabetes mellitus with complication (HCC)   . Diabetes mellitus type 2 in obese (HCC)   . Acute blood loss anemia   . Myoclonus   . Anoxic brain injury (  HCC)   . Seizures (HCC)   . Dilated cardiomyopathy (HCC) 05/24/2016  . Coronary artery disease involving native coronary artery of native heart with angina pectoris (HCC) 05/24/2016  . 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 Status post coronary artery stent placement   . Essential hypertension 09/21/2013  . Pulmonary nodules 10/16/2011    RAMSEUR,CHRIS, PTA 08/27/2017, 3:37 PM  [facility] [address] [place], [place], [address] Phone: [phone]   Fax:  [phone]  Name: Douglas Carr MRN: [id number] Date of Birth: 1966/03/19

## 2017-08-31 NOTE — Progress Notes (Signed)
Subjective:   Douglas Carr was seen in consultation in the movement disorder clinic at the request of [name], [name], MD.  Multiple hospital notes have been reviewed.  Pt accompanied by wife who supplements the history.  The patient was admitted for a lengthy hospital stay for V. fib arrest in January, 2018.  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  Wife and patient report that this movement is better.  The patient had a defibrillator placed in February, 2018.  Following the defibrillator placement, the patient reported that his tremor, which he had at baseline, got much worse.   Wife and patient state that he really didn't have tremor at baseline.  He was evaluated by neurology and it was felt that this could be either an unmasking of his baseline tremor or worsening of the myoclonus.  It was recommended that he stay on Keppra and add clonazepam.  He takes it twice per day.  It works but if he is stressed out he will note tremor.  If he has tremor now, it is whole body but legs more than arms.  No preference for one side of body.  More at night than during the day.  It may last for several hours and then will go away.  No family hx of tremor.    Current/Previously tried tremor medications: n/a  Current medications that may exacerbate tremor:  n/a  Outside reports reviewed: historical medical records, radiology reports and referral letter/letters.MRI brain done 06/02/16 and demonstrated L thalamic infarct (acute).  I did review this personally.  01/22/17 update:  Pt seen in follow up.  On keppra for post anoxic myoclonus and doing well in that regard.   Wife does not notice any jerking but patient states that he still has some.   He also has tremor for which he is on klonopin. Tremor is coming and going but seems to be a bit worse.   This got worse after defib placed in 06/2016.  He is on klonopin 0.5 mg bid and he was told  he could take an extra 1/2 tablet prn.  The records that were made available to me were reviewed.  Did not go back to work, which resulted in him having to apply for disability.  Some depression associated with inability to work.  No SI/HI.  Started on cymbalta by PCP.  Recently fell in the house.  Just lost his balance and hit back against door jam.  Last PT was 2 months ago.  Last ST was about 6 months ago.  09/01/17 update: Patient is seen today in follow-up.  Wife accompanies him, who supplements the history.   I decreased his Keppra last visit to 500 mg twice daily.  He is not having myoclonus.  He was started on primidone for tremor.   He is doing better with tremor.   He is also on clonazepam 0.5 mg twice per day for tremor.  The records that were made available to me were reviewed.  Has been going to PT. balance is getting better with this.  Seen in the emergency room in January for near syncope.  It was felt that it was possible that the patient took too much pain medication.  An MRI of the brain was completed on May 20, 2017 during that emergency room stay and demonstrated atrophy, more than one would expect for age and old bilateral parietal and right frontal lobe infarcts.  I personally reviewed this and most of atrophy was in cerebellar hemispheres. There was nothing acute.  No Known Allergies  Outpatient Encounter Medications as of 09/01/2017  Medication Sig  . acetaminophen (TYLENOL) 325 MG tablet Take 2 tablets (650 mg total) by mouth every 4 (four) hours as needed for headache or mild pain.  Marland Kitchen aspirin EC 81 MG tablet Take 81 mg by mouth daily.  Marland Kitchen atorvastatin (LIPITOR) 80 MG tablet TAKE 1 TABLET BY MOUTH DAILY  . baclofen (LIORESAL) 10 MG tablet Take 1 tablet (10 mg total) by mouth 3 (three) times daily.  . carvedilol (COREG) 6.25 MG tablet Take 1 tablet (6.25 mg total) by mouth 2 (two) times daily.  . clonazePAM (KLONOPIN) 0.5 MG tablet TAKE 1 TABLET BY MOUTH TWICE A DAY  .  clopidogrel (PLAVIX) 75 MG tablet TAKE 1 TABLET BY MOUTH EVERY DAY IN THE EVENING  . DULoxetine (CYMBALTA) 30 MG capsule TAKE 1 CAPSULE BY MOUTH EVERY DAY  . ibuprofen (ADVIL,MOTRIN) 200 MG tablet Take 200 mg by mouth every 6 (six) hours as needed for moderate pain.  . Insulin Degludec (TRESIBA FLEXTOUCH) 200 UNIT/ML SOPN Inject 116 Units into the skin daily.  Marland Kitchen KLOR-CON M20 20 MEQ tablet TAKE 1 TABLET DAILY  . levETIRAcetam (KEPPRA) 500 MG tablet Take 1 tablet (500 mg total) by mouth 2 (two) times daily.  Marland Kitchen lisinopril (PRINIVIL,ZESTRIL) 2.5 MG tablet TAKE 1 TABLET BY MOUTH EVERY DAY  . meloxicam (MOBIC) 15 MG tablet TAKE 1 TABLET BY MOUTH EVERY DAY  . metFORMIN (GLUCOPHAGE) 500 MG tablet TAKE 1 TABLET BY MOUTH TWICE A DAY  . mirtazapine (REMERON) 7.5 MG tablet TAKE 1 TABLET AT BEDTIME FOR DEPRESSION  . NOVOLOG FLEXPEN 100 UNIT/ML FlexPen INJECT PER SLIDING SCALE BEFORE MEALS 201-250: 6UNITS, 251-300: 8U, 301-350: 10U, 351+ : CALL MD  . ONETOUCH VERIO test strip TEST TWICE DAILY  . primidone (MYSOLINE) 50 MG tablet Take 1 tablet (50 mg total) by mouth at bedtime.  . tamsulosin (FLOMAX) 0.4 MG CAPS capsule TAKE 1 CAPSULE DAILY IN THE EVENING  . torsemide (DEMADEX) 20 MG tablet TAKE 1 TABLET BY MOUTH EVERY DAY   No facility-administered encounter medications on file as of 09/01/2017.     Past Medical History:  Diagnosis Date  . Acute ST elevation myocardial infarction (STEMI) involving left circumflex coronary artery (HCC) 05/07/2016   PCI to Cx-OM  . Anginal pain (HCC)    secondary to sm. vessel disease  . Anxiety   . Arthritis    BIL KNEE PAIN AND BIL ANKLE PAIN  . Bone spur of ankle   . Cardiac arrest (HCC) 05/24/2016   with v fib  . 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 Coronary artery disease involving native coronary artery of native heart with angina pectoris (HCC) 05/24/2016   Remote MI at 52 years of age, Last cath  2010-diffuse non-obstructive disease; last echo 06/16/08 -normal LV function, moderate concentric hypertrophy; nuc 08/2008 no ischemia;  medical therapy; STEMI May 07 2016 - PCI to Cx-OM  . Diabetes mellitus    ON ORAL MEDICATION AND INSULIN  . Dilated cardiomyopathy (HCC) 05/24/2016   EF 325-30% by Echo post STEMI (previously 30-35%)   . Hyperlipidemia   . Hypertension   . Myocardial infarction (HCC) 1996   Post MI  . Peripheral vascular disease (HCC)    HAS LEFT CAROTID ARTERY STENOSIS   AND IS S/P RIGHT CAROTID ENDARTERECTOMY  2010 Last carotid dopplers 01/08/2012 wth patent endarterectomy site  . Status post coronary artery stent placement     Past Surgical History:  Procedure Laterality Date  . APPENDECTOMY    . CARDIAC CATHETERIZATION     FEB 2010, significant branch vessel disease wth diag, marginal, PDA & PLA, nml. LV function  . CARDIAC CATHETERIZATION N/A 05/07/2016   Procedure: Left Heart Cath and Coronary Angiography;  Surgeon: Peter M Swaziland, MD;  [facility] INVASIVE CV LAB;  Service: Cardiovascular;  Laterality: N/A;  . CARDIAC CATHETERIZATION N/A 05/07/2016   Procedure: Coronary Stent Intervention;  Surgeon: Peter M Swaziland, MD;  Location: [facility] INVASIVE CV LAB;  Service: Cardiovascular;  Laterality: N/A;  . CARDIAC CATHETERIZATION N/A 05/24/2016   Procedure: Left Heart Cath and Coronary Angiography;  Surgeon: Marykay Lex, MD;  Location: [facility] INVASIVE CV LAB;  Service: Cardiovascular;  Laterality: N/A;  . CARDIAC CATHETERIZATION N/A 05/24/2016   Procedure: Coronary Stent Intervention;  Surgeon: Marykay Lex, MD;  Location: [facility] INVASIVE CV LAB;  Service: Cardiovascular;  Laterality: N/A;  2.5x20 Promus to Ostial/proximal circumflex  . CAROTID ENDARTERECTOMY  09/2008   Rt CEA  . ICD IMPLANT N/A 06/30/2016   Procedure: ICD Implant;  Surgeon: Will Jorja Loa, MD;  Location: [facility] INVASIVE CV LAB;  Service: Cardiovascular;  Laterality: N/A;  . LUNG BIOPSY  2013   Bx's suggest  granulomatous dz.  . RT ANKLE   2013    Social History   Socioeconomic History  . Marital status: Married    Spouse name: Erskine Squibb  . Number of children: 3  . Years of education: Not on file  . Highest education level: Not on file  Occupational History  . Occupation: Midwife for [employer]    Employer: [employer]   Social Needs  . Financial resource strain: Not on file  . Food insecurity:    Worry: Not on file    Inability: Not on file  . Transportation needs:    Medical: Not on file    Non-medical: Not on file  Tobacco Use  . Smoking status: Former Smoker    Packs/day: 2.00    Years: 22.00    Pack years: 44.00    Types: Cigarettes    Last attempt to quit: 10/23/2011    Years since quitting: 5.8  . Smokeless tobacco: Current User    Types: Snuff  Substance and Sexual Activity  . Alcohol use: No  . Drug use: No  . Sexual activity: Not on file  Lifestyle  . Physical activity:    Days per week: Not on file    Minutes per session: Not on file  . Stress: Not on file  Relationships  . Social connections:    Talks on phone: Not on file    Gets together: Not on file    Attends religious service: Not on file    Active member of club or organization: Not on file    Attends meetings of clubs or organizations: Not on file    Relationship status: Not on file  . Intimate partner violence:    Fear of current or ex partner: Not on file    Emotionally abused: Not on file    Physically abused: Not on file    Forced sexual activity: Not on file  Other Topics Concern  . Not on file  Social History Narrative   Pt lives with family in [place], [place].    Family Status  Relation Name Status  . Mother  Alive  . MGM  Deceased at age 75  . MGF  Deceased at age 35  . PGF  Deceased at age 37   . Brother  Alive  . PGM  Deceased at age 53  . Father  Deceased  . Child x3 Alive    Review of Systems A complete 10 system ROS was obtained and was negative apart from what is mentioned.     Objective:   VITALS:   Vitals:   09/01/17 1519  BP: 122/68  Pulse: 92  SpO2: 96%  Weight: 215 lb (97.5 kg)  Height:  (1.753 m)   Gen:  Appears stated age and in NAD. HEENT:  Normocephalic, atraumatic. The mucous membranes are moist. The superficial temporal arteries are without ropiness or tenderness. Cardiovascular: Regular rate and rhythm. Lungs: Clear to auscultation bilaterally. Neck: There are no carotid bruits noted bilaterally.  NEUROLOGICAL:  Orientation:  The patient is alert and oriented x 3.   Cranial nerves: There is good facial symmetry.  The is asymmetric blink.  The pupils are equal round and reactive to light bilaterally. Fundoscopic exam reveals clear disc margins bilaterally. Extraocular muscles are intact and visual fields are full to confrontational testing. Speech is fluent, but now has a pseudobulbar quality. Soft palate rises symmetrically and there is no tongue deviation. Hearing is intact to conversational tone. Tone: Tone is good throughout. Sensation: Sensation is intact to light touch touch throughout Coordination:  The patient has ataxia with HS on the right.  Mild ataxia with finger-nose-finger on the right.  No difficulty with hand opening and closing bilaterally.  No difficulty with heel taps are toe taps bilaterally. Motor: Strength is 5/5 in the bilateral upper and lower extremities.  There is a right pronator drift. DTR's: Deep tendon reflexes are 1/4 at the bilateral biceps, triceps, brachioradialis, patella and achilles.  Plantar responses are downgoing bilaterally. Gait and Station: The patient quickly gets out of the chair.  He walks quickly down the hall, but gait is somewhat wide-based and just mildly unsteady.  MOVEMENT EXAM: Abnormal movements:  There is no myoclonus.  There is no tremor noted  Lab Results  Component Value Date   HGBA1C 9.3 (H) 05/07/2016        Assessment/Plan:   1.  Abnormal movements  -Patient used to have  post anoxic myoclonus, for which she was placed on Keppra.  He is doing much better in that regard and we decided to go ahead and wean him off of the Keppra.  Take 500 mill grams once a day for a week and then discontinue the medication.  -Pt will continue on klonopin 0.5 mg bid and can take 1/2 tablet prn.  That seems to help tremor.  I may wean that in the future.  -Found a great benefit with primidone for tremor so remain on 50 mg q hs.   2.  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 fib arrest.  It appears that there is also a lesion in left STN.  Talked about stroke signs and symptoms.   -He is back in physical therapy.  Talked to him about getting into the gym once physical therapy and here and he agrees it is something he needs to do.  Told him to look into cardiac rehab, and perhaps he would qualify for that and be able to do that for cheaper.  3.  Peripheral neuropathy  -The patient has clinical examination evidence  of a diffuse peripheral neuropathy, which certainly can affect gait and balance.  We discussed safety associated with peripheral neuropathy.  We discussed balance therapy and the importance of ambulatory assistive device for balance assistance.  When necessary he is doing both of these.  He likely has neuropathy from his long-standing, uncontrolled diabetes.  4. diabetes mellitus  -This is uncontrolled.  Patient thinks that his A1c is under better control now.  States that over the holidays he did not do well.  He understands morbidity and mortality associated with diabetes.  5.  Follow-up in 9 months, sooner should new neurologic issues arise.     CC:  [name], [name], MD

## 2017-09-01 ENCOUNTER — Encounter: Payer: Self-pay | Admitting: Neurology

## 2017-09-01 ENCOUNTER — Encounter: Payer: BC Managed Care – PPO | Admitting: *Deleted

## 2017-09-01 ENCOUNTER — Ambulatory Visit: Payer: BC Managed Care – PPO | Admitting: Neurology

## 2017-09-01 VITALS — BP 122/68 | HR 92 | Ht 69.0 in | Wt 215.0 lb

## 2017-09-01 DIAGNOSIS — G253 Myoclonus: Secondary | ICD-10-CM | POA: Diagnosis not present

## 2017-09-01 DIAGNOSIS — R251 Tremor, unspecified: Secondary | ICD-10-CM | POA: Diagnosis not present

## 2017-09-01 MED ORDER — PRIMIDONE 50 MG PO TABS: 50.0000 mg | ORAL_TABLET | Freq: Every day | ORAL | 2 refills | Status: DC

## 2017-09-01 NOTE — Addendum Note (Signed)
Addended by: Kerin Salen S on: 09/01/2017 04:00 PM   Modules accepted: Level of Service

## 2017-09-01 NOTE — Patient Instructions (Signed)
1.  Decrease Keppra to 500 mg once per day for a week and then stop it 2.  Continue primidone 50 mg once per day

## 2017-09-03 ENCOUNTER — Ambulatory Visit: Payer: BC Managed Care – PPO | Attending: Family Medicine | Admitting: *Deleted

## 2017-09-03 DIAGNOSIS — R2681 Unsteadiness on feet: Secondary | ICD-10-CM | POA: Diagnosis present

## 2017-09-03 DIAGNOSIS — M6281 Muscle weakness (generalized): Secondary | ICD-10-CM | POA: Insufficient documentation

## 2017-09-03 DIAGNOSIS — R262 Difficulty in walking, not elsewhere classified: Secondary | ICD-10-CM | POA: Diagnosis present

## 2017-09-03 NOTE — Therapy (Signed)
[facility] [place], [place], [address] Phone: [phone]   Fax:  [phone]  Physical Therapy Treatment  Patient Details  Name: BAYDEN GIL MRN: [id number] Date of Birth: Aug 24, 1965 Referring Provider: Vonna Kotyk [name] MD   Encounter Date: 09/03/2017  PT End of Session - 09/03/17 1459    Visit Number  20    Number of Visits  20    Date for PT Re-Evaluation  08/26/17    PT Start Time  1430    PT Stop Time  1520    PT Time Calculation (min)  50 min       Past Medical History:  Diagnosis Date  . Acute ST elevation myocardial infarction (STEMI) involving left circumflex coronary artery ([place]) 05/07/2016   PCI to Cx-OM  . Anginal pain ([place])    secondary to sm. vessel disease  . Anxiety   . Arthritis    BIL KNEE PAIN AND BIL ANKLE PAIN  . Bone spur of ankle   . Cardiac arrest ([place]) 05/24/2016   with v fib  . Chronic combined systolic and diastolic CHF, NYHA class 2 and ACA/AHA stage C ([place]) 05/13/2016  . Coronary artery disease involving native coronary artery of native heart with angina pectoris ([place]) 05/24/2016   Remote MI at 52 years of age, Last cath 2010-diffuse non-obstructive disease; last echo 06/16/08 -normal LV function, moderate concentric hypertrophy; nuc 08/2008 no ischemia;  medical therapy; STEMI May 07 2016 - PCI to Cx-OM  . Diabetes mellitus    ON ORAL MEDICATION AND INSULIN  . Dilated cardiomyopathy ([place]) 05/24/2016   EF 325-30% by Echo post STEMI (previously 30-35%)   . Hyperlipidemia   . Hypertension   . Myocardial infarction ([place]) 1996   Post MI  . Peripheral vascular disease ([place])    HAS LEFT CAROTID ARTERY STENOSIS   AND IS S/P RIGHT CAROTID ENDARTERECTOMY 2010 Last carotid dopplers 01/08/2012 wth patent endarterectomy site  . Status post coronary artery stent placement     Past Surgical History:  Procedure Laterality Date  . APPENDECTOMY    . CARDIAC CATHETERIZATION     FEB 2010,  significant branch vessel disease wth diag, marginal, PDA & PLA, nml. LV function  . CARDIAC CATHETERIZATION N/A 05/07/2016   Procedure: Left Heart Cath and Coronary Angiography;  Surgeon: Peter M Martinique, MD;  Location: [place] CV LAB;  Service: Cardiovascular;  Laterality: N/A;  . CARDIAC CATHETERIZATION N/A 05/07/2016   Procedure: Coronary Stent Intervention;  Surgeon: Peter M Martinique, MD;  Location: [facility] CV LAB;  Service: Cardiovascular;  Laterality: N/A;  . CARDIAC CATHETERIZATION N/A 05/24/2016   Procedure: Left Heart Cath and Coronary Angiography;  Surgeon: [name], MD;  Location: [facility] CV LAB;  Service: Cardiovascular;  Laterality: N/A;  . CARDIAC CATHETERIZATION N/A 05/24/2016   Procedure: Coronary Stent Intervention;  Surgeon: [name], MD;  Location: [place] [facility] CV LAB;  Service: Cardiovascular;  Laterality: N/A;  2.5x20 Promus to Ostial/proximal circumflex  . CAROTID ENDARTERECTOMY  09/2008   Rt CEA  . ICD IMPLANT N/A 06/30/2016   Procedure: ICD Implant;  Surgeon: Will Meredith Leeds, MD;  Location: [facility] CV LAB;  Service: Cardiovascular;  Laterality: N/A;  . LUNG BIOPSY  2013   Bx's suggest granulomatous dz.  . RT ANKLE   2013    There were no vitals filed for this visit.  Subjective Assessment - 09/03/17 1454    Subjective  I have had a good  week. Get tired easy    Pertinent History  MI, HTN, ICD implant.    Patient Stated Goals  Walk and not fall.    Currently in Pain?  No/denies         [facility] PT Assessment - 09/03/17 0001      Berg Balance Test   Sit to Stand  Able to stand without using hands and stabilize independently    Standing Unsupported  Able to stand safely 2 minutes    Sitting with Back Unsupported but Feet Supported on Floor or Stool  Able to sit safely and securely 2 minutes    Stand to Sit  Sits safely with minimal use of hands    Transfers  Able to transfer safely, minor use of hands    Standing Unsupported with Eyes  Closed  Able to stand 10 seconds with supervision    Standing Ubsupported with Feet Together  Able to place feet together independently and stand 1 minute safely    From Standing, Reach Forward with Outstretched Arm  Can reach confidently >25 cm (10")    From Standing Position, Pick up Object from Floor  Able to pick up shoe safely and easily    From Standing Position, Turn to Look Behind Over each Shoulder  Looks behind from both sides and weight shifts well    Turn 360 Degrees  Able to turn 360 degrees safely in 4 seconds or less    Standing Unsupported, Alternately Place Feet on Step/Stool  Able to stand independently and safely and complete 8 steps in 20 seconds    Standing Unsupported, One Foot in Front  Able to plae foot ahead of the other independently and hold 30 seconds    Standing on One Leg  Able to lift leg independently and hold 5-10 seconds    Total Score  53                   [facility] Adult PT Treatment/Exercise - 09/03/17 0001      Knee/Hip Exercises: Aerobic   Nustep  L6 x15 min      Knee/Hip Exercises: Machines for Strengthening   Cybex Knee Extension  20# 3x fatigue. decreased reps due to Elliptical    Cybex Knee Flexion  50# 3x20 reps          Balance Exercises - 09/03/17 1455      Balance Exercises: Standing   Standing Eyes Opened  Narrow base of support (BOS);Foam/compliant surface;Cognitive challenge    Tandem Stance  Eyes open;Foam/compliant surface;Intermittent upper extremity support    Balance Beam  side stepping    Tandem Gait  Forward;Intermittent upper extremity support;Foam/compliant surface;Cognitive challenge numbers/colors             PT Long Term Goals - 09/03/17 1501      PT LONG TERM GOAL #1   Title  Independent with a HEP.    Time  6    Period  Weeks    Status  Achieved      PT LONG TERM GOAL #2   Title  Berg score= 53/56.    Time  6    Period  Weeks    Status  Achieved      PT LONG TERM GOAL #3   Title  Walk a  community distance without an assisitve device.    Baseline  Patient has to stop around 600 feet/5 min due to hip pain    Period  Weeks    Status  Achieved            Plan - 09/03/17 1522    Clinical Impression Statement  Pt arrived today feeling fairly well and was able to complete all Act.'s without complaints. All LTG's were met and Pt will be DC to HEP and Gym program.    Clinical Presentation  Stable    Clinical Decision Making  Low    Clinical Impairments Affecting Rehab Potential  MI on 05/24/16, ICD implant, CVA.    PT Frequency  2x / week    PT Duration  2 weeks    PT Treatment/Interventions  ADLs/Self Care Home Management;Stair training;Gait training;Therapeutic activities;Therapeutic exercise;Balance training;Neuromuscular re-education;Patient/family education    PT Next Visit Plan  DC to HEP/ Gym program       Patient will benefit from skilled therapeutic intervention in order to improve the following deficits and impairments:  Abnormal gait, Decreased activity tolerance, Decreased balance  Visit Diagnosis: Unsteadiness on feet  Difficulty walking  Muscle weakness (generalized)     Problem List Patient Active Problem List   Diagnosis Date Noted  . Anxiety and depression 11/19/2016  . Ischemic cardiomyopathy 06/30/2016  . Diabetes mellitus with complication ([place])   . Diabetes mellitus type 2 in obese ([place])   . Acute blood loss anemia   . Myoclonus   . Anoxic brain injury ([place])   . Seizures ([place])   . Dilated cardiomyopathy ([place]) 05/24/2016  . Coronary artery disease involving native coronary artery of native heart with angina pectoris ([place]) 05/24/2016  . Chronic combined systolic and diastolic CHF, NYHA class 2 and ACA/AHA stage C ([place]) 05/13/2016  . Status post coronary artery stent placement   . Essential hypertension 09/21/2013  . Pulmonary nodules 10/16/2011    Santo Zahradnik,CHRIS, PTA 09/03/2017, 4:39 PM  [facility] [place], [place], [address] Phone: [phone]   Fax:  [phone]  Name: MAYCOL HOYING MRN: [id number] Date of Birth: May 20, 1965  PHYSICAL THERAPY DISCHARGE SUMMARY  Visits from Start of Care: 20.  Current functional level related to goals / functional outcomes: See above.   Remaining deficits: Goals met.   Education / Equipment: HEP. Plan: Patient agrees to discharge.  Patient goals were met. Patient is being discharged due to meeting the stated rehab goals.  ?????          Mali Applegate MPT

## 2017-10-07 ENCOUNTER — Other Ambulatory Visit: Payer: Self-pay | Admitting: Family Medicine

## 2017-10-13 ENCOUNTER — Ambulatory Visit (INDEPENDENT_AMBULATORY_CARE_PROVIDER_SITE_OTHER): Payer: BC Managed Care – PPO | Admitting: *Deleted

## 2017-10-13 DIAGNOSIS — I255 Ischemic cardiomyopathy: Secondary | ICD-10-CM | POA: Diagnosis not present

## 2017-10-13 LAB — CUP PACEART REMOTE DEVICE CHECK
Battery Voltage: 3.05 V
Brady Statistic RV Percent Paced: 0.01 %
HighPow Impedance: 73 Ohm
Implantable Lead Implant Date: 20180226
Implantable Lead Location: 753860
Implantable Pulse Generator Implant Date: 20180226
Lead Channel Impedance Value: 399 Ohm
Lead Channel Pacing Threshold Amplitude: 1 V
Lead Channel Pacing Threshold Pulse Width: 0.4 ms
Lead Channel Sensing Intrinsic Amplitude: 17.625 mV
Lead Channel Setting Pacing Amplitude: 2.5 V
Lead Channel Setting Sensing Sensitivity: 0.3 mV
MDC IDC MSMT BATTERY REMAINING LONGEVITY: 131 mo
MDC IDC MSMT LEADCHNL RV IMPEDANCE VALUE: 285 Ohm
MDC IDC MSMT LEADCHNL RV SENSING INTR AMPL: 17.625 mV
MDC IDC SESS DTM: 20190611082503
MDC IDC SET LEADCHNL RV PACING PULSEWIDTH: 0.4 ms

## 2017-10-13 NOTE — Progress Notes (Signed)
Remote ICD transmission.   

## 2017-10-14 ENCOUNTER — Encounter: Payer: Self-pay | Admitting: Cardiology

## 2017-10-16 ENCOUNTER — Other Ambulatory Visit: Payer: Self-pay | Admitting: Family Medicine

## 2017-12-18 ENCOUNTER — Other Ambulatory Visit: Payer: Self-pay | Admitting: Family Medicine

## 2017-12-18 DIAGNOSIS — F419 Anxiety disorder, unspecified: Secondary | ICD-10-CM

## 2017-12-18 DIAGNOSIS — F329 Major depressive disorder, single episode, unspecified: Secondary | ICD-10-CM

## 2017-12-18 DIAGNOSIS — F32A Depression, unspecified: Secondary | ICD-10-CM

## 2017-12-20 ENCOUNTER — Other Ambulatory Visit: Payer: Self-pay | Admitting: Family Medicine

## 2017-12-21 NOTE — Telephone Encounter (Signed)
Attempted to contact patient - NA °

## 2017-12-21 NOTE — Telephone Encounter (Signed)
Authorize 30 days only. Then contact the patient letting them know that they will need an appointment before any further prescriptions can be sent in. 

## 2017-12-21 NOTE — Telephone Encounter (Signed)
1 month supply only.  Patient is due for office visit.

## 2017-12-29 ENCOUNTER — Other Ambulatory Visit: Payer: Self-pay | Admitting: Family Medicine

## 2017-12-31 ENCOUNTER — Telehealth: Payer: Self-pay | Admitting: Family Medicine

## 2017-12-31 NOTE — Telephone Encounter (Signed)
Yes we can go ahead and put in the note that he is unable to care for a child on his own in his current medical condition and if they require more we can even put diagnosis of history of anoxic brain injury and cardiac disease.

## 2017-12-31 NOTE — Telephone Encounter (Signed)
Letter typed and given to Dr. Louanne Skyeettinger for signing

## 2018-01-02 ENCOUNTER — Other Ambulatory Visit: Payer: Self-pay | Admitting: Family Medicine

## 2018-01-05 NOTE — Telephone Encounter (Signed)
Lm will ntbs for refills

## 2018-01-06 ENCOUNTER — Other Ambulatory Visit: Payer: Self-pay | Admitting: Family Medicine

## 2018-01-07 ENCOUNTER — Other Ambulatory Visit: Payer: Self-pay | Admitting: Family Medicine

## 2018-01-07 NOTE — Telephone Encounter (Signed)
Please review and advise Last seen 05/2017

## 2018-01-10 ENCOUNTER — Other Ambulatory Visit: Payer: Self-pay | Admitting: Family Medicine

## 2018-01-12 ENCOUNTER — Ambulatory Visit (INDEPENDENT_AMBULATORY_CARE_PROVIDER_SITE_OTHER): Payer: BC Managed Care – PPO | Admitting: *Deleted

## 2018-01-12 DIAGNOSIS — I255 Ischemic cardiomyopathy: Secondary | ICD-10-CM | POA: Diagnosis not present

## 2018-01-12 NOTE — Progress Notes (Signed)
Remote ICD transmission.   

## 2018-01-13 ENCOUNTER — Telehealth: Payer: Self-pay | Admitting: Neurology

## 2018-01-13 NOTE — Telephone Encounter (Signed)
Patient's son called and left a voicemail stating that the pharmacy called and they need a refill sent in for his clonazepam medication. If you need to call him back it's [phone]. Thanks!

## 2018-01-13 NOTE — Telephone Encounter (Signed)
Rx sent in #60 with 1 refill.

## 2018-01-14 ENCOUNTER — Ambulatory Visit: Payer: BC Managed Care – PPO | Admitting: Family Medicine

## 2018-01-14 ENCOUNTER — Telehealth: Payer: Self-pay | Admitting: Neurology

## 2018-01-14 ENCOUNTER — Encounter: Payer: Self-pay | Admitting: Family Medicine

## 2018-01-14 VITALS — BP 128/72 | HR 95 | Temp 98.0°F | Ht 69.0 in | Wt 205.0 lb

## 2018-01-14 DIAGNOSIS — E669 Obesity, unspecified: Secondary | ICD-10-CM

## 2018-01-14 DIAGNOSIS — M25551 Pain in right hip: Secondary | ICD-10-CM

## 2018-01-14 DIAGNOSIS — I10 Essential (primary) hypertension: Secondary | ICD-10-CM | POA: Diagnosis not present

## 2018-01-14 DIAGNOSIS — F329 Major depressive disorder, single episode, unspecified: Secondary | ICD-10-CM

## 2018-01-14 DIAGNOSIS — M25552 Pain in left hip: Secondary | ICD-10-CM

## 2018-01-14 DIAGNOSIS — E118 Type 2 diabetes mellitus with unspecified complications: Secondary | ICD-10-CM

## 2018-01-14 DIAGNOSIS — R569 Unspecified convulsions: Secondary | ICD-10-CM

## 2018-01-14 DIAGNOSIS — E1169 Type 2 diabetes mellitus with other specified complication: Secondary | ICD-10-CM

## 2018-01-14 DIAGNOSIS — F419 Anxiety disorder, unspecified: Secondary | ICD-10-CM | POA: Diagnosis not present

## 2018-01-14 DIAGNOSIS — F32A Depression, unspecified: Secondary | ICD-10-CM

## 2018-01-14 DIAGNOSIS — R55 Syncope and collapse: Secondary | ICD-10-CM

## 2018-01-14 LAB — BAYER DCA HB A1C WAIVED: HB A1C (BAYER DCA - WAIVED): >14 % — ABNORMAL HIGH (ref <7.0)

## 2018-01-14 MED ORDER — MIRTAZAPINE 7.5 MG PO TABS: ORAL_TABLET | ORAL | 0 refills | Status: DC

## 2018-01-14 MED ORDER — LISINOPRIL 2.5 MG PO TABS: 2.5000 mg | ORAL_TABLET | Freq: Every day | ORAL | 3 refills | Status: DC

## 2018-01-14 MED ORDER — ATORVASTATIN CALCIUM 80 MG PO TABS: 80.0000 mg | ORAL_TABLET | Freq: Every day | ORAL | 3 refills | Status: DC

## 2018-01-14 MED ORDER — CLONAZEPAM 0.5 MG PO TABS: 0.5000 mg | ORAL_TABLET | Freq: Two times a day (BID) | ORAL | 1 refills | Status: DC

## 2018-01-14 MED ORDER — INSULIN DEGLUDEC 200 UNIT/ML ~~LOC~~ SOPN: 115.0000 [IU] | PEN_INJECTOR | Freq: Every day | SUBCUTANEOUS | 1 refills | Status: DC

## 2018-01-14 MED ORDER — CARVEDILOL 6.25 MG PO TABS: 6.2500 mg | ORAL_TABLET | Freq: Two times a day (BID) | ORAL | 3 refills | Status: DC

## 2018-01-14 MED ORDER — CLOPIDOGREL BISULFATE 75 MG PO TABS: ORAL_TABLET | ORAL | 2 refills | Status: DC

## 2018-01-14 MED ORDER — MELOXICAM 15 MG PO TABS: 15.0000 mg | ORAL_TABLET | Freq: Every day | ORAL | 0 refills | Status: DC

## 2018-01-14 MED ORDER — DULOXETINE HCL 30 MG PO CPEP: ORAL_CAPSULE | ORAL | 1 refills | Status: DC

## 2018-01-14 MED ORDER — FREESTYLE LIBRE 14 DAY READER DEVI: 1.0000 | Freq: Four times a day (QID) | 1 refills | Status: DC

## 2018-01-14 MED ORDER — TAMSULOSIN HCL 0.4 MG PO CAPS: ORAL_CAPSULE | ORAL | 0 refills | Status: DC

## 2018-01-14 MED ORDER — FREESTYLE LIBRE 14 DAY SENSOR MISC: 1.0000 | 3 refills | Status: DC

## 2018-01-14 MED ORDER — POTASSIUM CHLORIDE CRYS ER 20 MEQ PO TBCR: 20.0000 meq | EXTENDED_RELEASE_TABLET | Freq: Every day | ORAL | 1 refills | Status: DC

## 2018-01-14 MED ORDER — METFORMIN HCL 500 MG PO TABS: 500.0000 mg | ORAL_TABLET | Freq: Two times a day (BID) | ORAL | 2 refills | Status: DC

## 2018-01-14 MED ORDER — TORSEMIDE 20 MG PO TABS: 20.0000 mg | ORAL_TABLET | Freq: Every day | ORAL | 0 refills | Status: DC

## 2018-01-14 NOTE — Telephone Encounter (Signed)
RX sent yesterday and documented in chart.

## 2018-01-14 NOTE — Telephone Encounter (Signed)
Patient left message with answering service needing to have his Clonazepam  prescription sent to [facility]. Thanks

## 2018-01-14 NOTE — Progress Notes (Signed)
BP 128/72   Pulse 95   Temp 98 F (36.7 C) (Oral)   Ht 5' 9"  (1.753 m)   Wt 205 lb (93 kg)   BMI 30.27 kg/m    Subjective:    Patient ID: Douglas Carr, male    DOB: Oct 10, 1965, 52 y.o.   MRN: [id number]  HPI: EARL LOSEE is a 52 y.o. male presenting on 01/14/2018 for Diabetes (Check up on chronic medical conditions) and Hypertension   HPI Type 2 diabetes mellitus Patient comes in today for recheck of his diabetes. Patient has been currently taking Tresiba 116 units, patient said he stopped because he ran out of his blood glucose strips, he is still taking metformin. Patient is currently on an ACE inhibitor/ARB. Patient has not seen an ophthalmologist this year. Patient denies any new issues with their feet.   Hypertension Patient is currently on lisinopril and carvedilol, and their blood pressure today is 128/72. Patient denies any lightheadedness or dizziness. Patient denies headaches, blurred vision, chest pains, shortness of breath, or weakness. Denies any side effects from medication and is content with current medication.   Anxiety depression recheck Patient is coming in for anxiety depression recheck.  They say that he is doing very well, this is more of a chronic issue that is been fighting.  He is currently taking Remeron and clonazepam for this and needs a refill for clonazepam  Seizure history Patient is coming in for a follow-up secondary to CVA and seizures, he is also complaining of hip pain from this incident and gait instability likely secondary to a CVA.  He is working with physical therapy currently needs a refill for meloxicam to help with his back.  He is currently on Keppra and has a follow-up appointment with neurology  Relevant past medical, surgical, family and social history reviewed and updated as indicated. Interim medical history since our last visit reviewed. Allergies and medications reviewed and updated.  Review of Systems    Constitutional: Negative for chills and fever.  Eyes: Negative for visual disturbance.  Respiratory: Negative for shortness of breath and wheezing.   Cardiovascular: Negative for chest pain and leg swelling.  Musculoskeletal: Positive for arthralgias and back pain. Negative for gait problem.  Skin: Negative for rash.  Neurological: Positive for seizures and syncope. Negative for dizziness, weakness, light-headedness, numbness and headaches.  All other systems reviewed and are negative.   Per HPI unless specifically indicated above   Allergies as of 01/14/2018   No Known Allergies     Medication List        Accurate as of 01/14/18  3:25 PM. Always use your most recent med list.          acetaminophen 325 MG tablet Commonly known as:  TYLENOL Take 2 tablets (650 mg total) by mouth every 4 (four) hours as needed for headache or mild pain.   aspirin EC 81 MG tablet Take 81 mg by mouth daily.   atorvastatin 80 MG tablet Commonly known as:  LIPITOR Take 1 tablet (80 mg total) by mouth daily.   carvedilol 6.25 MG tablet Commonly known as:  COREG Take 1 tablet (6.25 mg total) by mouth 2 (two) times daily.   clonazePAM 0.5 MG tablet Commonly known as:  KLONOPIN Take 1 tablet (0.5 mg total) by mouth 2 (two) times daily.   clopidogrel 75 MG tablet Commonly known as:  PLAVIX TAKE 1 TABLET BY MOUTH EVERY DAY IN THE EVENING   DULoxetine 30 MG  capsule Commonly known as:  CYMBALTA TAKE 1 CAPSULE BY MOUTH EVERY DAY   FREESTYLE LIBRE 14 DAY READER Devi 1 each by Does not apply route 4 (four) times daily.   FREESTYLE LIBRE 14 DAY SENSOR Misc 1 each by Does not apply route every 14 (fourteen) days.   ibuprofen 200 MG tablet Commonly known as:  ADVIL,MOTRIN Take 200 mg by mouth every 6 (six) hours as needed for moderate pain.   Insulin Degludec 200 UNIT/ML Sopn Inject 116 Units into the skin daily.   lisinopril 2.5 MG tablet Commonly known as:  PRINIVIL,ZESTRIL Take 1  tablet (2.5 mg total) by mouth daily.   meloxicam 15 MG tablet Commonly known as:  MOBIC Take 1 tablet (15 mg total) by mouth daily.   metFORMIN 500 MG tablet Commonly known as:  GLUCOPHAGE Take 1 tablet (500 mg total) by mouth 2 (two) times daily.   mirtazapine 7.5 MG tablet Commonly known as:  REMERON TAKE 1 TABLET AT BEDTIME FOR DEPRESSION (Needs to be seen)   ONETOUCH VERIO test strip Generic drug:  glucose blood TEST TWICE DAILY   potassium chloride SA 20 MEQ tablet Commonly known as:  K-DUR,KLOR-CON Take 1 tablet (20 mEq total) by mouth daily.   primidone 50 MG tablet Commonly known as:  MYSOLINE Take 1 tablet (50 mg total) by mouth at bedtime.   tamsulosin 0.4 MG Caps capsule Commonly known as:  FLOMAX TAKE 1 CAPSULE DAILY IN THE EVENING. Needs to be seen   torsemide 20 MG tablet Commonly known as:  DEMADEX Take 1 tablet (20 mg total) by mouth daily. (Needs to be seen)          Objective:    BP 128/72   Pulse 95   Temp 98 F (36.7 C) (Oral)   Ht 5' 9"  (1.753 m)   Wt 205 lb (93 kg)   BMI 30.27 kg/m   Wt Readings from Last 3 Encounters:  01/14/18 205 lb (93 kg)  09/01/17 215 lb (97.5 kg)  05/29/17 229 lb 4 oz (104 kg)    Physical Exam  Constitutional: He is oriented to person, place, and time. He appears well-developed and well-nourished. No distress.  Eyes: Conjunctivae are normal. No scleral icterus.  Neck: Neck supple. No thyromegaly present.  Cardiovascular: Normal rate, regular rhythm, normal heart sounds and intact distal pulses.  No murmur heard. Pulmonary/Chest: Effort normal and breath sounds normal. No respiratory distress. He has no wheezes.  Musculoskeletal: Normal range of motion. He exhibits no edema.       Right hip: He exhibits normal range of motion, normal strength and no tenderness.       Left hip: He exhibits normal range of motion, normal strength and no tenderness.  Patient had bilateral hip pain and arthritis    Lymphadenopathy:    He has no cervical adenopathy.  Neurological: He is alert and oriented to person, place, and time. Coordination normal.  Skin: Skin is warm and dry. No rash noted. He is not diaphoretic.  Psychiatric: His behavior is normal. His mood appears anxious. He exhibits a depressed mood. He expresses no suicidal ideation. He expresses no suicidal plans.  Nursing note and vitals reviewed.       Assessment & Plan:   Problem List Items Addressed This Visit      Cardiovascular and Mediastinum   Essential hypertension - Primary (Chronic)   Relevant Medications   carvedilol (COREG) 6.25 MG tablet   atorvastatin (LIPITOR) 80 MG tablet  lisinopril (PRINIVIL,ZESTRIL) 2.5 MG tablet   torsemide (DEMADEX) 20 MG tablet     Endocrine   Diabetes mellitus type 2 in obese (HCC)   Relevant Medications   atorvastatin (LIPITOR) 80 MG tablet   Insulin Degludec (TRESIBA FLEXTOUCH) 200 UNIT/ML SOPN   lisinopril (PRINIVIL,ZESTRIL) 2.5 MG tablet   metFORMIN (GLUCOPHAGE) 500 MG tablet   Continuous Blood Gluc Receiver (FREESTYLE LIBRE 14 DAY READER) DEVI   Continuous Blood Gluc Sensor (FREESTYLE LIBRE 14 DAY SENSOR) MISC   Other Relevant Orders   Bayer DCA Hb A1c Waived (Completed)   CMP14+EGFR (Completed)   Lipid panel (Completed)   Diabetes mellitus with complication (HCC)   Relevant Medications   atorvastatin (LIPITOR) 80 MG tablet   Insulin Degludec (TRESIBA FLEXTOUCH) 200 UNIT/ML SOPN   lisinopril (PRINIVIL,ZESTRIL) 2.5 MG tablet   metFORMIN (GLUCOPHAGE) 500 MG tablet   Continuous Blood Gluc Receiver (FREESTYLE LIBRE 14 DAY READER) DEVI   Continuous Blood Gluc Sensor (FREESTYLE LIBRE 14 DAY SENSOR) MISC   Other Relevant Orders   Bayer DCA Hb A1c Waived (Completed)   CMP14+EGFR (Completed)   Lipid panel (Completed)     Other   Seizures (HCC)   Relevant Medications   clonazePAM (KLONOPIN) 0.5 MG tablet   Anxiety and depression   Relevant Medications   DULoxetine  (CYMBALTA) 30 MG capsule   mirtazapine (REMERON) 7.5 MG tablet   Other Relevant Orders   CBC with Differential/Platelet (Completed)    Other Visit Diagnoses    Bilateral hip pain       Likely from gait instability secondary to CVA, working with physical therapy to gain strength back. We'll give meloxicam   Relevant Medications   meloxicam (MOBIC) 15 MG tablet   Syncope, unspecified syncope type       Relevant Medications   carvedilol (COREG) 6.25 MG tablet   atorvastatin (LIPITOR) 80 MG tablet   lisinopril (PRINIVIL,ZESTRIL) 2.5 MG tablet   torsemide (DEMADEX) 20 MG tablet    Instructed patient to restart his insulin, also instructed patient that we will send him a freestyle libre and he can try that if not he will get his own meter, am concerned with noncompliance and had a quick talk with patient about the importance especially with his history.  Follow up plan: Return in about 3 months (around 04/15/2018), or if symptoms worsen or fail to improve, for Recheck diabetes.  Counseling provided for all of the vaccine components Orders Placed This Encounter  Procedures  . Bayer DCA Hb A1c Waived  . CBC with Differential/Platelet  . CMP14+EGFR  . Lipid panel    Caryl Pina, MD [facility] 01/14/2018, 3:25 PM

## 2018-01-15 LAB — CMP14+EGFR
A/G RATIO: 1.5 (ref 1.2–2.2)
ALT: 12 IU/L (ref 0–44)
AST: 9 IU/L (ref 0–40)
Albumin: 4.3 g/dL (ref 3.5–5.5)
Alkaline Phosphatase: 83 IU/L (ref 39–117)
BUN/Creatinine Ratio: 14 (ref 9–20)
BUN: 19 mg/dL (ref 6–24)
Bilirubin Total: <0.2 mg/dL (ref 0.0–1.2)
CALCIUM: 9.4 mg/dL (ref 8.7–10.2)
CO2: 23 mmol/L (ref 20–29)
Chloride: 93 mmol/L — ABNORMAL LOW (ref 96–106)
Creatinine, Ser: 1.33 mg/dL — ABNORMAL HIGH (ref 0.76–1.27)
GFR, EST AFRICAN AMERICAN: 71 mL/min/{1.73_m2} (ref >59)
GFR, EST NON AFRICAN AMERICAN: 61 mL/min/{1.73_m2} (ref >59)
Globulin, Total: 2.8 g/dL (ref 1.5–4.5)
Glucose: 481 mg/dL (ref 65–99)
POTASSIUM: 5 mmol/L (ref 3.5–5.2)
SODIUM: 134 mmol/L (ref 134–144)
TOTAL PROTEIN: 7.1 g/dL (ref 6.0–8.5)

## 2018-01-15 LAB — LIPID PANEL
CHOL/HDL RATIO: 9.4 ratio — AB (ref 0.0–5.0)
Cholesterol, Total: 274 mg/dL — ABNORMAL HIGH (ref 100–199)
HDL: 29 mg/dL — AB (ref >39)
LDL Calculated: 170 mg/dL — ABNORMAL HIGH (ref 0–99)
Triglycerides: 375 mg/dL — ABNORMAL HIGH (ref 0–149)
VLDL Cholesterol Cal: 75 mg/dL — ABNORMAL HIGH (ref 5–40)

## 2018-01-15 LAB — CBC WITH DIFFERENTIAL/PLATELET
BASOS: 0 %
Basophils Absolute: 0 10*3/uL (ref 0.0–0.2)
EOS (ABSOLUTE): 0.1 10*3/uL (ref 0.0–0.4)
Eos: 1 %
Hematocrit: 46.7 % (ref 37.5–51.0)
Hemoglobin: 14.9 g/dL (ref 13.0–17.7)
IMMATURE GRANS (ABS): 0 10*3/uL (ref 0.0–0.1)
Immature Granulocytes: 0 %
Lymphocytes Absolute: 2.9 10*3/uL (ref 0.7–3.1)
Lymphs: 38 %
MCH: 27.7 pg (ref 26.6–33.0)
MCHC: 31.9 g/dL (ref 31.5–35.7)
MCV: 87 fL (ref 79–97)
MONOS ABS: 0.6 10*3/uL (ref 0.1–0.9)
Monocytes: 7 %
Neutrophils Absolute: 4 10*3/uL (ref 1.4–7.0)
Neutrophils: 54 %
PLATELETS: 197 10*3/uL (ref 150–450)
RBC: 5.37 x10E6/uL (ref 4.14–5.80)
RDW: 13.1 % (ref 12.3–15.4)
WBC: 7.6 10*3/uL (ref 3.4–10.8)

## 2018-02-02 LAB — CUP PACEART REMOTE DEVICE CHECK
Battery Voltage: 3.04 V
Brady Statistic RV Percent Paced: 0 %
Date Time Interrogation Session: 20190910102205
HIGH POWER IMPEDANCE MEASURED VALUE: 81 Ohm
Lead Channel Impedance Value: 399 Ohm
Lead Channel Pacing Threshold Pulse Width: 0.4 ms
Lead Channel Sensing Intrinsic Amplitude: 17.375 mV
Lead Channel Setting Pacing Amplitude: 2.5 V
Lead Channel Setting Sensing Sensitivity: 0.3 mV
MDC IDC LEAD IMPLANT DT: 20180226
MDC IDC LEAD LOCATION: 753860
MDC IDC MSMT BATTERY REMAINING LONGEVITY: 129 mo
MDC IDC MSMT LEADCHNL RV IMPEDANCE VALUE: 304 Ohm
MDC IDC MSMT LEADCHNL RV PACING THRESHOLD AMPLITUDE: 0.875 V
MDC IDC MSMT LEADCHNL RV SENSING INTR AMPL: 17.375 mV
MDC IDC PG IMPLANT DT: 20180226
MDC IDC SET LEADCHNL RV PACING PULSEWIDTH: 0.4 ms

## 2018-03-23 IMAGING — CR DG CHEST 1V PORT
1 series · 1 of 1 positions shown · non-contrast
Comparison: Earlier this day at 7300 hour

CLINICAL DATA: Central line placement.

EXAM:
PORTABLE CHEST 1 VIEW

[AP]
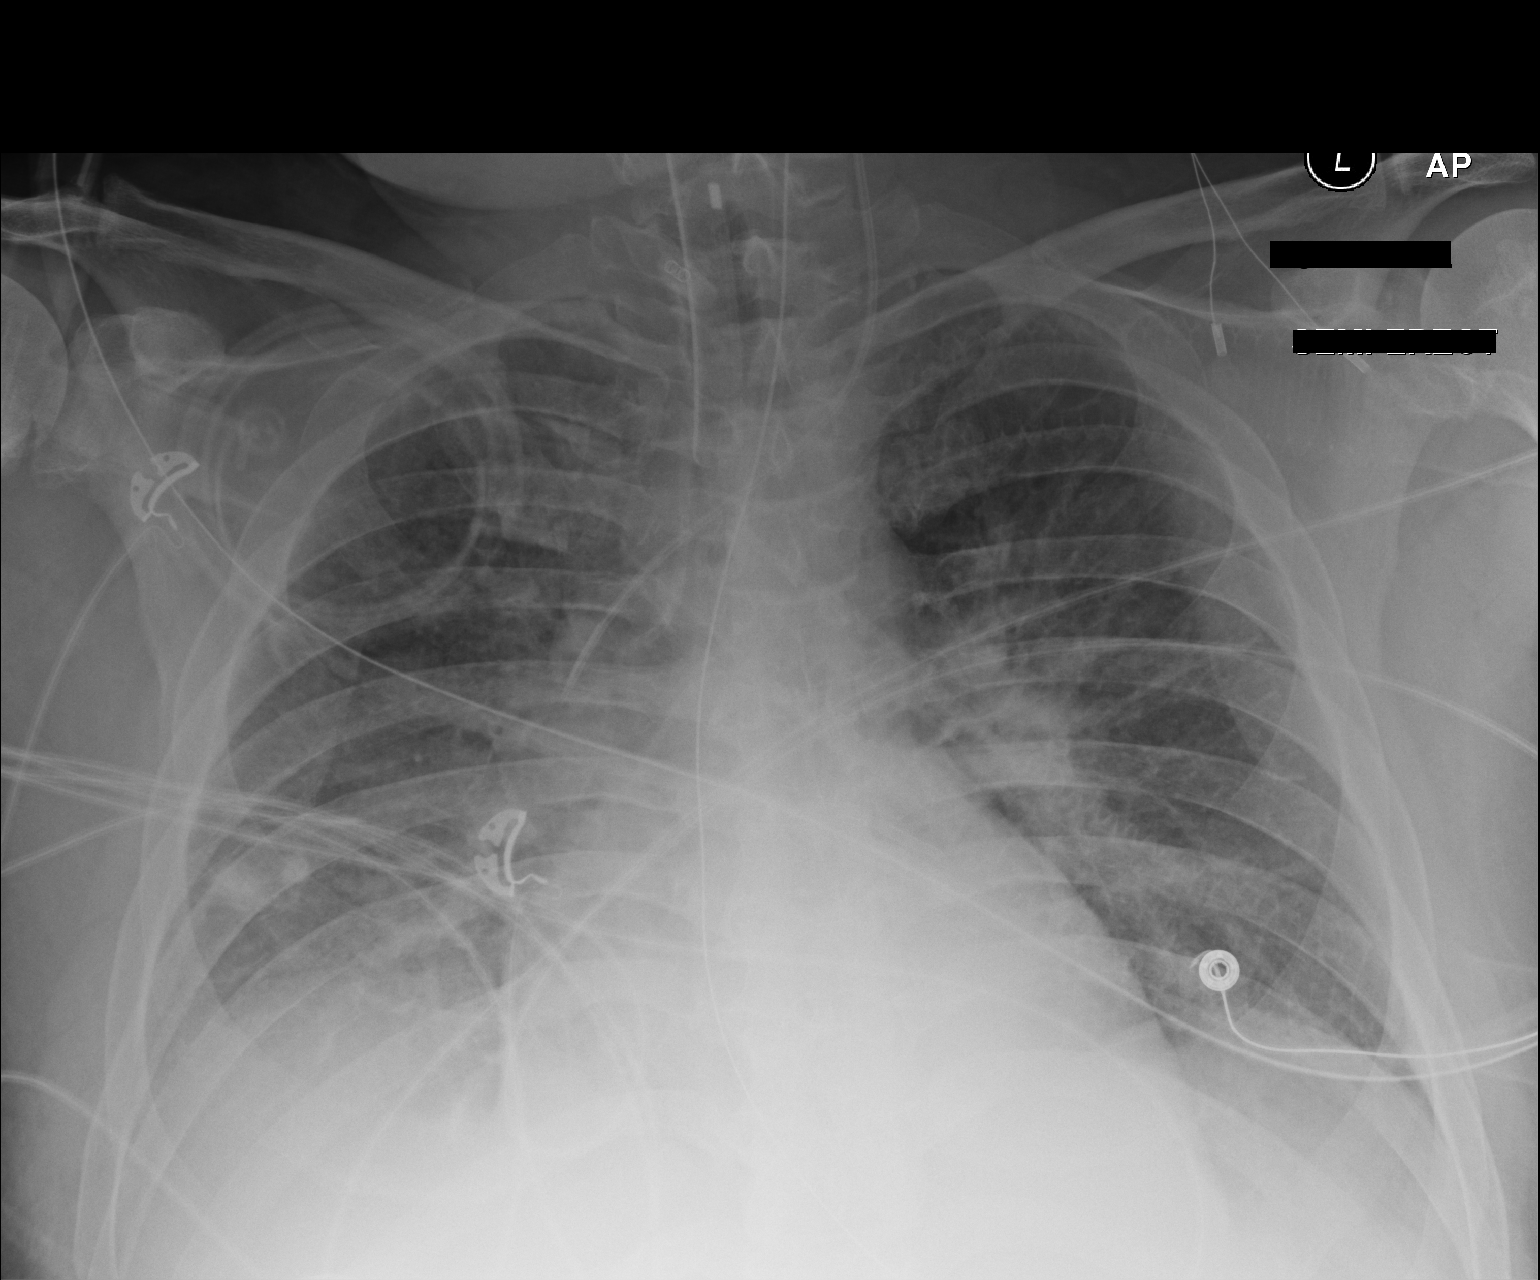

[1 of 1 positions shown; findings below may reference images not displayed]

FINDINGS: Tip of the left internal jugular central venous catheter projects
over the proximal SVC. No pneumothorax. Endotracheal tube 5 cm from
the carina. Enteric tube in place. Stable cardiomegaly allowing for
differences in technique. Developing hazy opacity at the right lung
base suspicious for worsening pleural effusion. Worsening diffuse
interstitial opacities throughout both lungs.
IMPRESSION: 1. Tip of the left central line in the proximal SVC. No
pneumothorax.
2. Developing hazy opacity at the right lung base, likely pleural
fluid. Worsening interstitial opacities may be increasing pulmonary
edema, pneumonia, or ARDS.

## 2018-03-23 IMAGING — CT CT HEAD W/O CM
4 series · 16 of 47 positions shown, 18 images · non-contrast
Comparison: 11/11/2003 CT head.

CLINICAL DATA: 50 y/o  M; seizures.

EXAM:
CT HEAD WITHOUT CONTRAST
TECHNIQUE: Contiguous axial images were obtained from the base of the skull
through the vertex without intravenous contrast.

[Series 2: head without · axial · non-contrast · 0.46mm/px · z∈[-188,-63]mm · 6 of 35 slices shown, 8 images]
[im 5/35  brain]
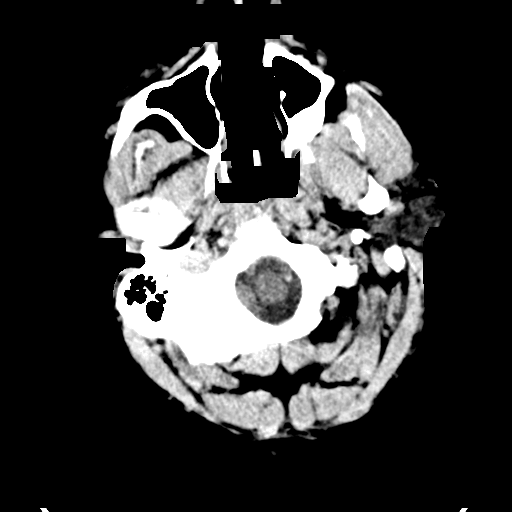
[im 5/35  bone]
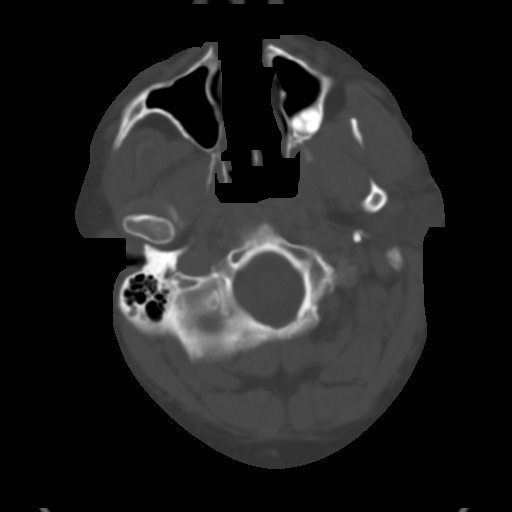
[im 10/35  brain]
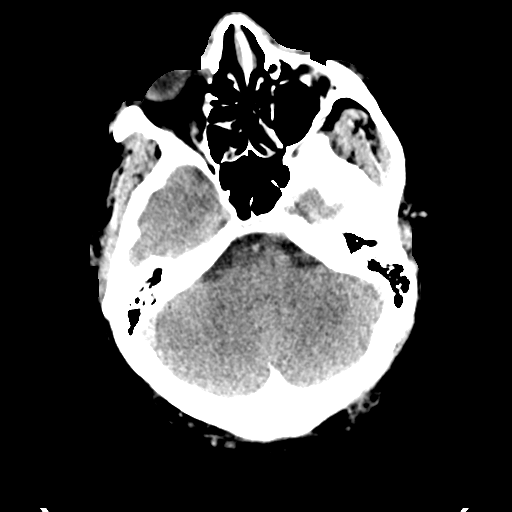
[im 15/35  brain]
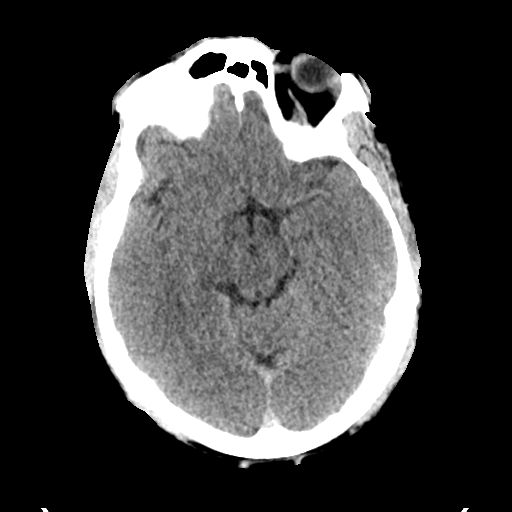
[im 20/35  brain]
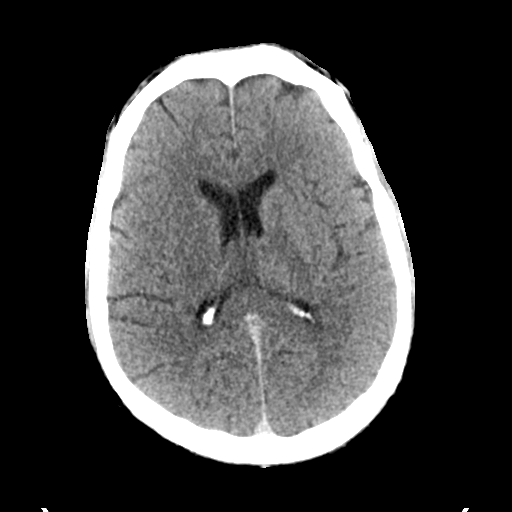
[im 25/35  brain]
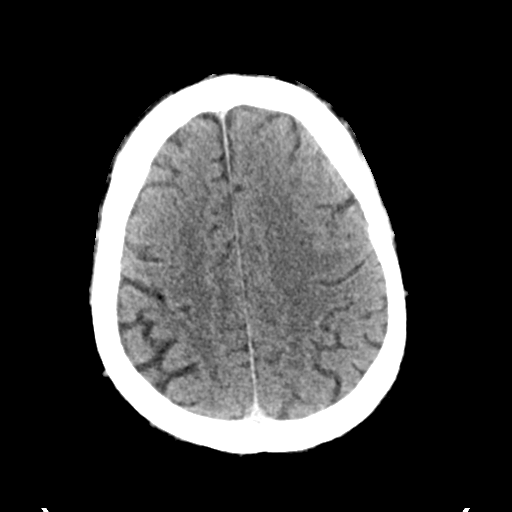
[im 25/35  bone]
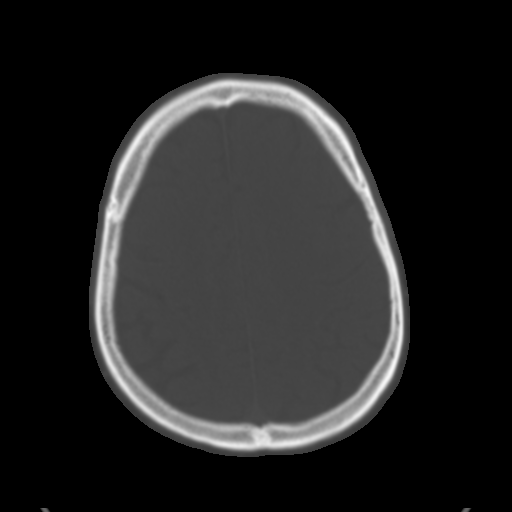
[im 30/35  brain]
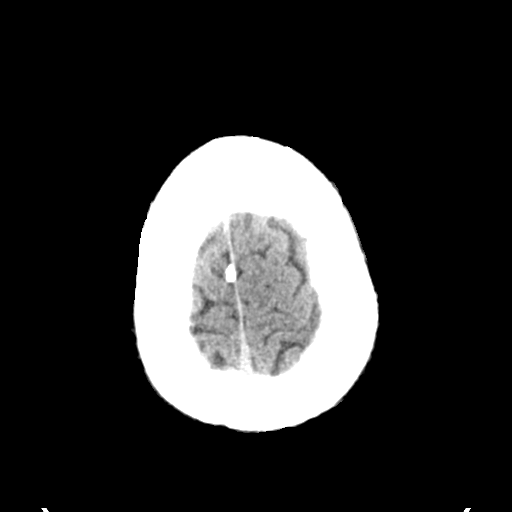

[Series 3: head bone · axial · 0.46mm/px · z∈[-192,-134]mm · 4 of 89 slices shown]
[im 9/89  bone]
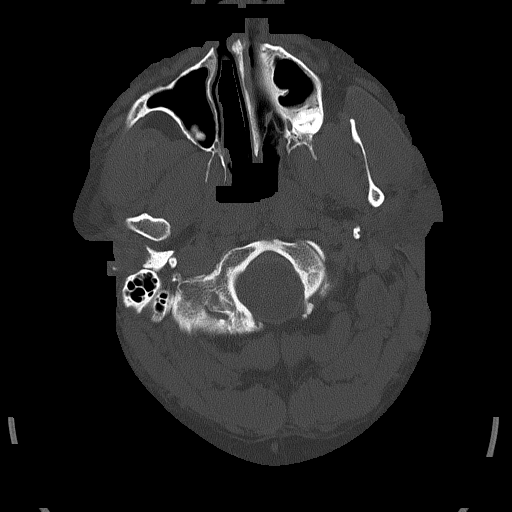
[im 17/89  bone]
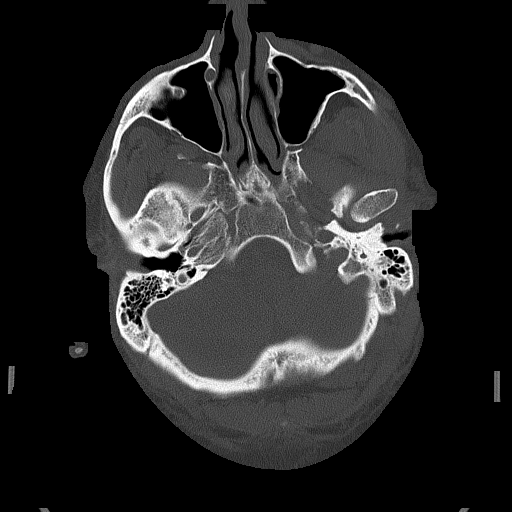
[im 30/89  bone]
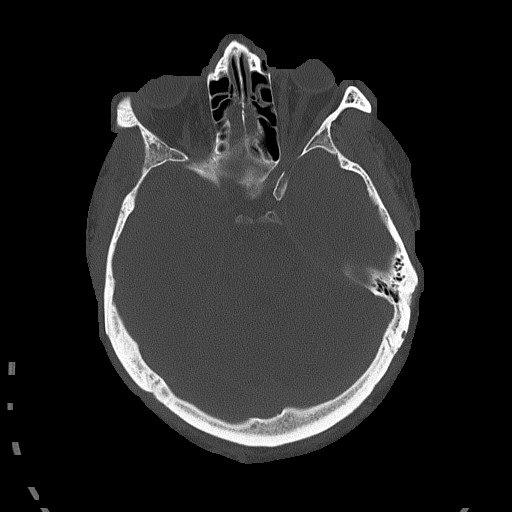
[im 38/89  bone]
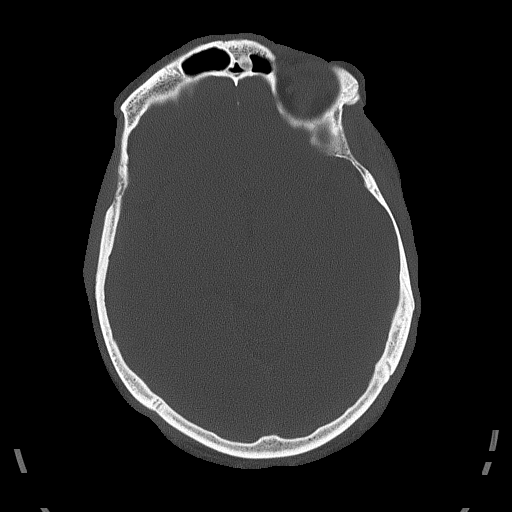

[Series 4: head without cor · coronal · non-contrast · 0.35mm/px · 3 of 75 slices shown]
[im 25/75  brain]
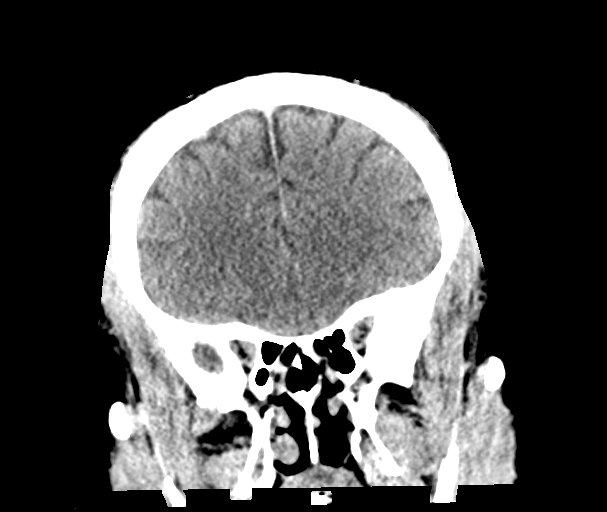
[im 33/75  brain]
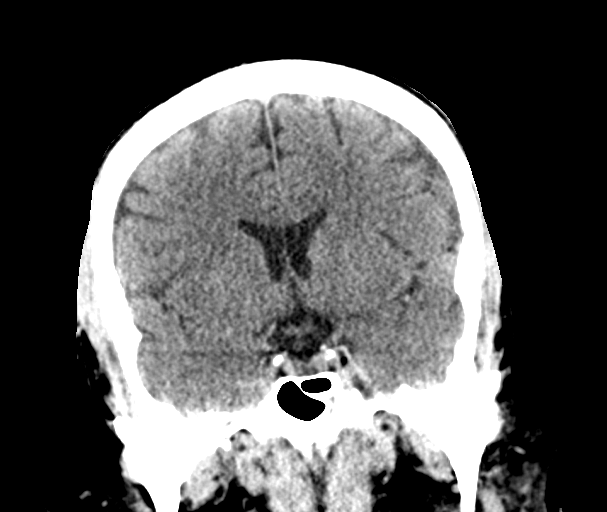
[im 42/75  brain]
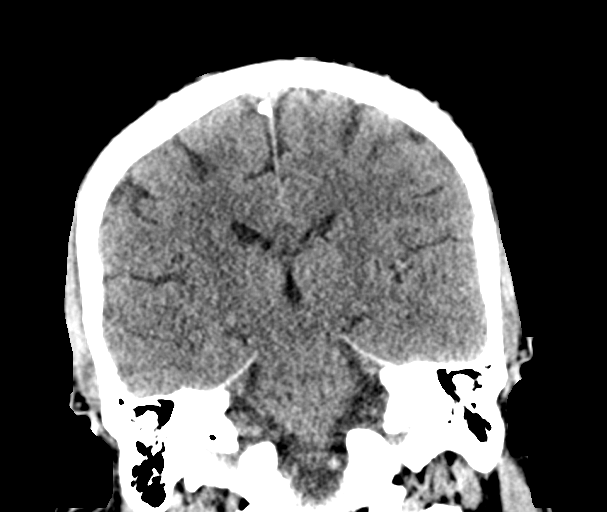

[Series 5: head without sag · sagittal · non-contrast · 0.37mm/px · 3 of 67 slices shown]
[im 25/67  brain]
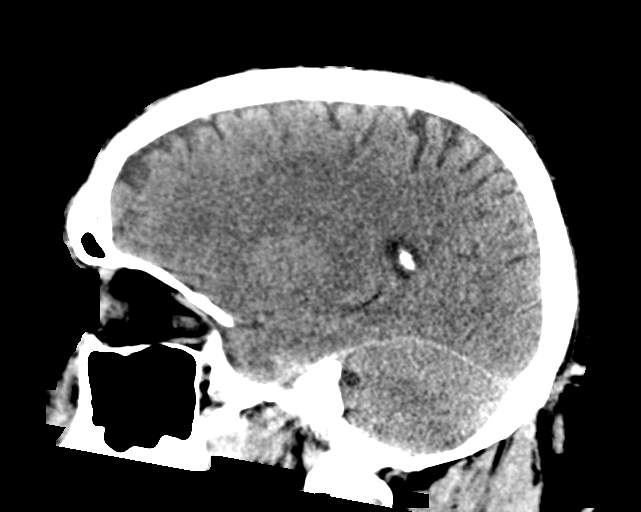
[im 34/67  brain]
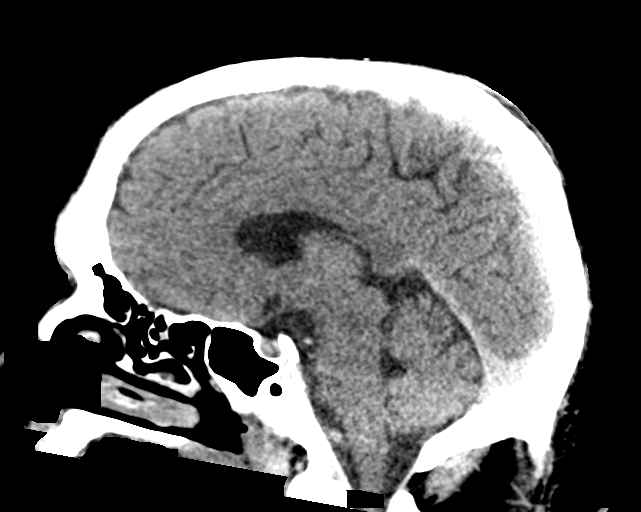
[im 43/67  brain]
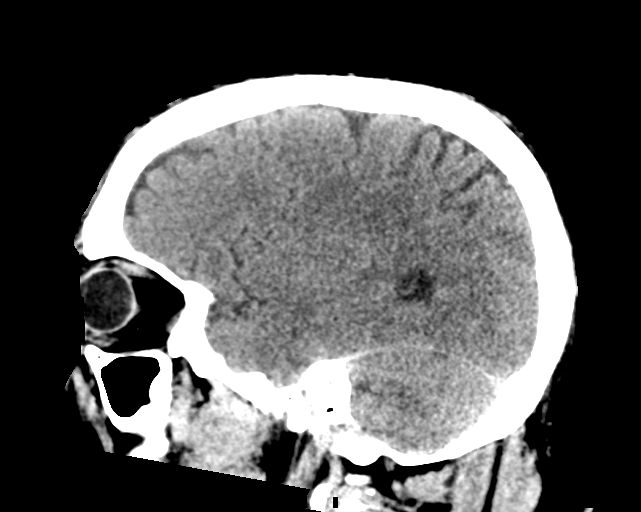

[16 of 47 positions shown; findings below may reference images not displayed]

FINDINGS: Brain: No evidence of acute infarction, hemorrhage, hydrocephalus,
extra-axial collection or mass lesion/mass effect.

Vascular: Minimal calcific atherosclerosis of cavernous internal
carotid arteries. Choose

Skull: Normal. Negative for fracture or focal lesion.

Sinuses/Orbits: No acute finding.

Other: Debris within the nasopharynx likely due to intubation.
IMPRESSION: No acute intracranial abnormality identified. No significant
interval change.

By: Danil Fenwick M.D.

## 2018-03-23 IMAGING — CR DG CHEST 1V PORT
1 series · 1 of 1 positions shown · non-contrast
Comparison: 05/12/2016

CLINICAL DATA: Status post intubation.

EXAM:
PORTABLE CHEST 1 VIEW

[AP]
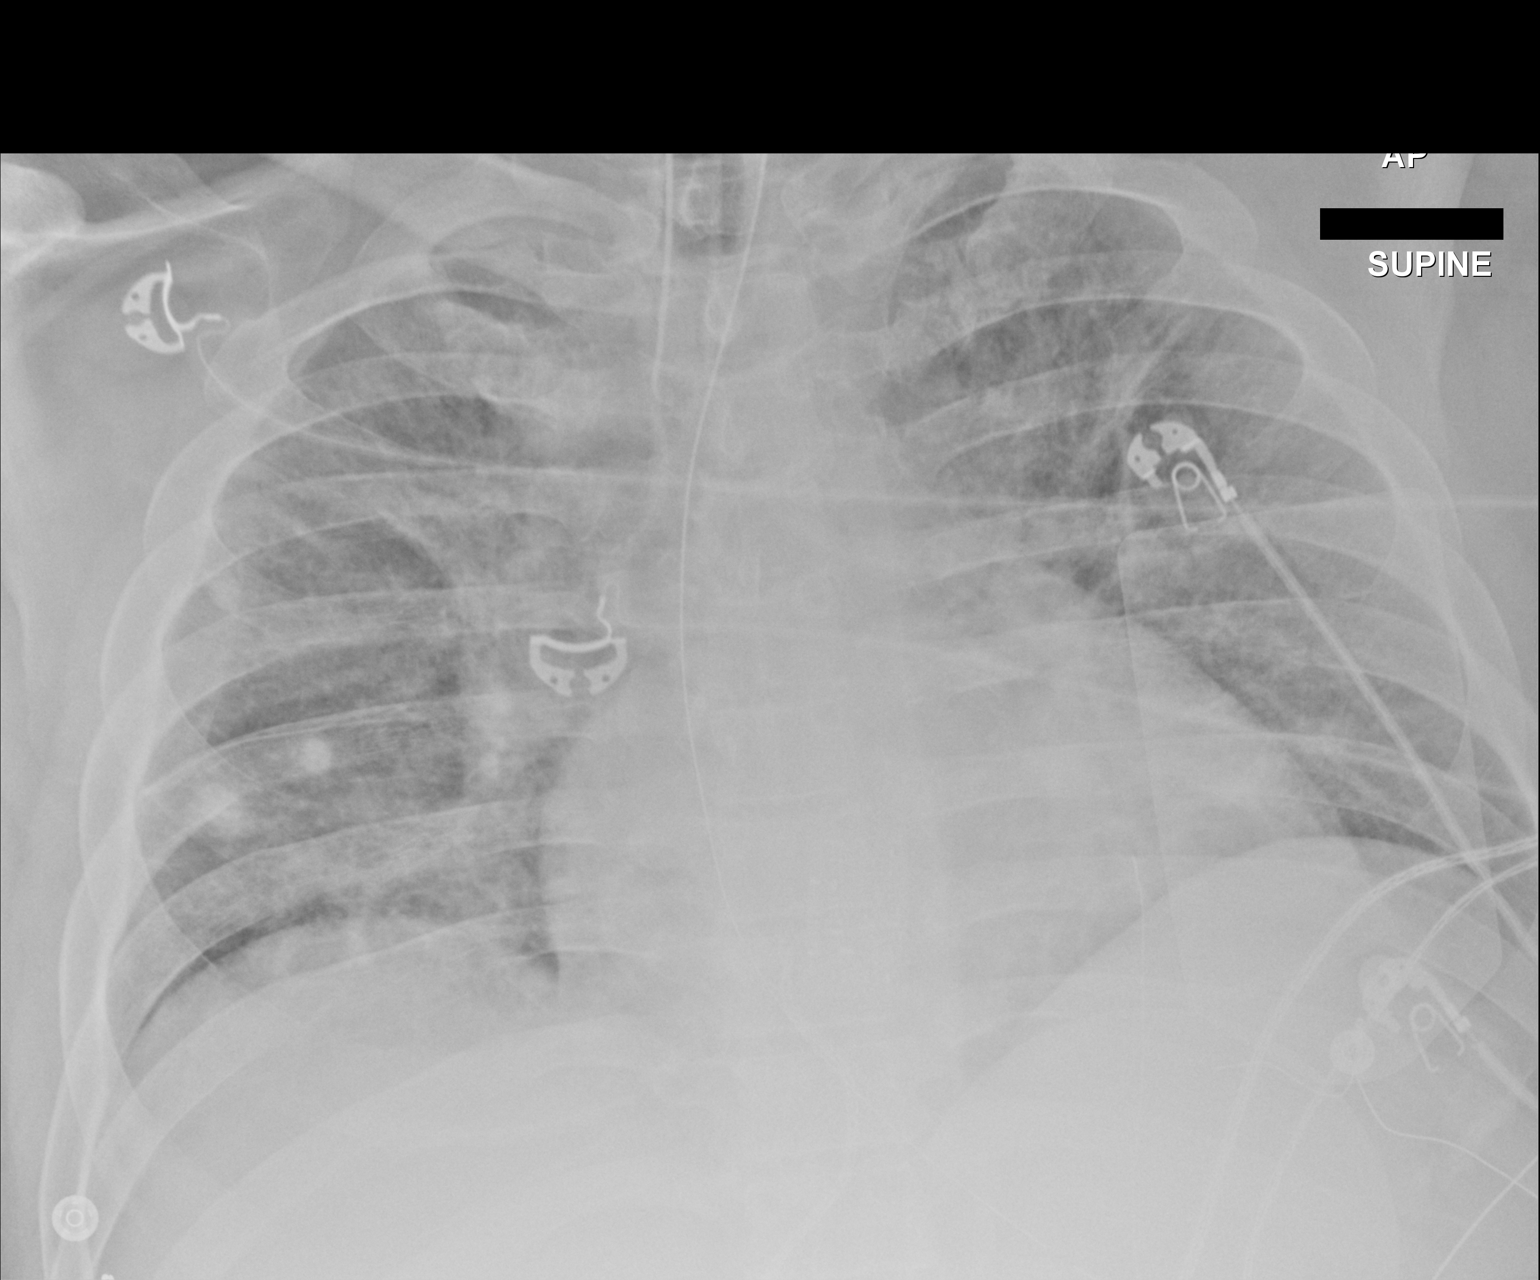

[1 of 1 positions shown; findings below may reference images not displayed]

FINDINGS: Endotracheal tube has been placed and terminates approximately 4 cm
above the carina. Enteric tube courses into the left upper abdomen
with tip not imaged. Cardiac silhouette is accentuated by portable
AP technique and low lung volumes. Diffuse interstitial densities
bilaterally have mildly increased. No large pleural effusion or
pneumothorax is identified. Calcified granulomas are again seen in
the right lung.
IMPRESSION: 1. Endotracheal tube in satisfactory position.
2. Worsening bilateral interstitial opacities which may reflect
edema or infection.

## 2018-03-28 ENCOUNTER — Other Ambulatory Visit: Payer: Self-pay | Admitting: Family Medicine

## 2018-03-31 ENCOUNTER — Encounter: Payer: Self-pay | Admitting: Cardiovascular Disease

## 2018-03-31 ENCOUNTER — Ambulatory Visit: Payer: BC Managed Care – PPO | Admitting: Cardiovascular Disease

## 2018-03-31 VITALS — BP 118/66 | HR 86 | Ht 69.0 in | Wt 208.0 lb

## 2018-03-31 DIAGNOSIS — Z23 Encounter for immunization: Secondary | ICD-10-CM

## 2018-03-31 DIAGNOSIS — I213 ST elevation (STEMI) myocardial infarction of unspecified site: Secondary | ICD-10-CM | POA: Diagnosis not present

## 2018-03-31 DIAGNOSIS — E785 Hyperlipidemia, unspecified: Secondary | ICD-10-CM | POA: Insufficient documentation

## 2018-03-31 NOTE — Assessment & Plan Note (Signed)
History of hyperlipidemia on high-dose statin therapy with recent lipid profile performed 01/14/2018 revealing total cholesterol 274, LDL 170 and HDL 29.  I believe he would benefit from being on Repatha.

## 2018-03-31 NOTE — Assessment & Plan Note (Signed)
History of CAD status post STEMI 05/07/2016 treated with a Promus drug-eluting stent to an obtuse marginal branch.  He is discharged home 4 days later.  He was readmitted 05/24/2016 with weakness sudden cardiac death.  He was resuscitated by his 51 year old son ROSC of that was fairly rapid.  He was catheterized by Dr. Herbie BaltimoreHarding revealing a 90% stenosis within the stented segment.  He had a prolonged hospital course with intubation and sedation as well as systemic cooling ultimately recovered full neurologic function.

## 2018-03-31 NOTE — Assessment & Plan Note (Signed)
History of essential hypertension her blood pressure measured at 118/66.  He is on carvedilol and lisinopril.  Continue current meds at current dosing.

## 2018-03-31 NOTE — Progress Notes (Signed)
03/31/2018 Douglas Carr   October 22, 1965  [id number]  Primary Physician [name], Elige RadonJoshua A, MD Primary Cardiologist: Runell GessJonathan J Kaidyn Javid MD Nicholes CalamityFACP, FACC, FAHA, [place]  HPI:  Douglas Carr is a 52 y.o.  mildly overweight married Caucasian male father of 3, and father of one grandchild is accompanied by his wife [name] today.  Last saw him in the office 07/08/2016.  He did have a myocardial infarction at age 52 and underwent cardiac catheterization by Dr. Alanda AmassWeintraub revealing occluded vessel with collaterals. Medical therapy was recommended. I apparently cathed him in 2010 without significant findings. The symptoms include treated hypertension, hyperlipidemia and diabetes. 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 He did have a remote right carotid endarterectomy performed approximately 10 years ago. Carotid Doppler performed recently on 06/09/16 showed acute thrombus of the origin of the right common carotid artery throughout the visualized portion of the ICA.  Douglas Carr had a STEMI on 05/07/16 and had a Promus struggling stent placed by Dr. [place] in the second obtuse marginal branch. He was discharged home 4 days later and readmitted the following day with volume overload. EF was 30%. He was diuresed and sent home after that. He was readmitted on 05/24/16 with a witnessed cardiac arrest and bystander CPR performed by history 51 year old son. He had fairly rapid R OSC and underwent emergency cardiac catheterization by Dr. Herbie BaltimoreHarding revealing a 90% stenosis at the origin of the previous stented vessel with 70% diffuse proximal AV groove circumflex stenosis which was stented by Dr. Herbie BaltimoreHarding. He had a prolonged hospital course with intubation sedation and systemic hypothermia. He was seen by palliative care. Ultimately he regained consciousness and neurologic function. He has been in rehabilitation since and is alert, oriented, conversant and ambulatory. He underwent ICD implantation by Dr.  Elberta Fortisamnitz on 06/30/16 for secondary prevention. Since I saw him in the office year and a half ago he remained stable.  He denies chest pain or shortness of breath.  Dr. Elberta Fortisamnitz follows his ICD annually with quarterly downloads by [facility].  Current Meds  Medication Sig  . acetaminophen (TYLENOL) 325 MG tablet Take 2 tablets (650 mg total) by mouth every 4 (four) hours as needed for headache or mild pain.  Marland Kitchen. aspirin EC 81 MG tablet Take 81 mg by mouth daily.  Marland Kitchen. atorvastatin (LIPITOR) 80 MG tablet Take 1 tablet (80 mg total) by mouth daily.  . carvedilol (COREG) 6.25 MG tablet Take 1 tablet (6.25 mg total) by mouth 2 (two) times daily.  . clonazePAM (KLONOPIN) 0.5 MG tablet Take 1 tablet (0.5 mg total) by mouth 2 (two) times daily.  . clopidogrel (PLAVIX) 75 MG tablet TAKE 1 TABLET BY MOUTH EVERY DAY IN THE EVENING  . Continuous Blood Gluc Receiver (FREESTYLE LIBRE 14 DAY READER) DEVI 1 each by Does not apply route 4 (four) times daily.  . Continuous Blood Gluc Sensor (FREESTYLE LIBRE 14 DAY SENSOR) MISC 1 each by Does not apply route every 14 (fourteen) days.  . DULoxetine (CYMBALTA) 30 MG capsule TAKE 1 CAPSULE BY MOUTH EVERY DAY  . ibuprofen (ADVIL,MOTRIN) 200 MG tablet Take 200 mg by mouth every 6 (six) hours as needed for moderate pain.  . Insulin Degludec (TRESIBA FLEXTOUCH) 200 UNIT/ML SOPN Inject 116 Units into the skin daily.  Marland Kitchen. lisinopril (PRINIVIL,ZESTRIL) 2.5 MG tablet Take 1 tablet (2.5 mg total) by mouth daily.  . meloxicam (MOBIC) 15 MG tablet Take 1 tablet (15 mg total) by mouth daily.  .Marland Kitchen  metFORMIN (GLUCOPHAGE) 500 MG tablet Take 1 tablet (500 mg total) by mouth 2 (two) times daily.  . mirtazapine (REMERON) 7.5 MG tablet TAKE 1 TABLET AT BEDTIME FOR DEPRESSION (NEEDS TO BE SEEN)  . ONETOUCH VERIO test strip TEST TWICE DAILY  . potassium chloride SA (KLOR-CON M20) 20 MEQ tablet Take 1 tablet (20 mEq total) by mouth daily.  . primidone (MYSOLINE) 50 MG tablet Take 1 tablet (50 mg  total) by mouth at bedtime.  . tamsulosin (FLOMAX) 0.4 MG CAPS capsule TAKE 1 CAPSULE DAILY IN THE EVENING. NEEDS TO BE SEEN  . torsemide (DEMADEX) 20 MG tablet TAKE 1 TABLET (20 MG TOTAL) BY MOUTH DAILY. (NEEDS TO BE SEEN)     No Known Allergies  Social History   Socioeconomic History  . Marital status: Married    Spouse name: Erskine Squibb  . Number of children: 3  . Years of education: Not on file  . Highest education level: Not on file  Occupational History  . Occupation: Midwife for [employer]    Employer: [employer]   Social Needs  . Financial resource strain: Not on file  . Food insecurity:    Worry: Not on file    Inability: Not on file  . Transportation needs:    Medical: Not on file    Non-medical: Not on file  Tobacco Use  . Smoking status: Former Smoker    Packs/day: 2.00    Years: 22.00    Pack years: 44.00    Types: Cigarettes    Last attempt to quit: 10/23/2011    Years since quitting: 6.4  . Smokeless tobacco: Current User    Types: Snuff  Substance and Sexual Activity  . Alcohol use: No  . Drug use: No  . Sexual activity: Not on file  Lifestyle  . Physical activity:    Days per week: Not on file    Minutes per session: Not on file  . Stress: Not on file  Relationships  . Social connections:    Talks on phone: Not on file    Gets together: Not on file    Attends religious service: Not on file    Active member of club or organization: Not on file    Attends meetings of clubs or organizations: Not on file    Relationship status: Not on file  . Intimate partner violence:    Fear of current or ex partner: Not on file    Emotionally abused: Not on file    Physically abused: Not on file    Forced sexual activity: Not on file  Other Topics Concern  . Not on file  Social History Narrative   Pt lives with family in [place], [place].     Review of Systems: General: negative for chills, fever, night sweats or weight changes.  Cardiovascular: negative for chest pain,  dyspnea on exertion, edema, orthopnea, palpitations, paroxysmal nocturnal dyspnea or shortness of breath Dermatological: negative for rash Respiratory: negative for cough or wheezing Urologic: negative for hematuria Abdominal: negative for nausea, vomiting, diarrhea, bright red blood per rectum, melena, or hematemesis Neurologic: negative for visual changes, syncope, or dizziness All other systems reviewed and are otherwise negative except as noted above.    Blood pressure 118/66, pulse 86, height 5\' 9"  (1.753 m), weight 208 lb (94.3 kg).  General appearance: alert and no distress Neck: no adenopathy, no carotid bruit, no JVD, supple, symmetrical, trachea midline and thyroid not enlarged, symmetric, no tenderness/mass/nodules Lungs: clear to auscultation bilaterally  Heart: Soft outflow tract murmur Extremities: extremities normal, atraumatic, no cyanosis or edema Pulses: 2+ and symmetric Skin: Skin color, texture, turgor normal. No rashes or lesions Neurologic: Alert and oriented X 3, normal strength and tone. Normal symmetric reflexes. Normal coordination and gait  EKG sinus rhythm at 86 with septal Q waves and lateral T wave inversion.  I personally reviewed this EKG.  ASSESSMENT AND PLAN:   Essential hypertension History of essential hypertension her blood pressure measured at 118/66.  He is on carvedilol and lisinopril.  Continue current meds at current dosing.  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  He is otherwise asymptomatic.  I am going to recheck a 2D echo for LV function.  Coronary artery disease involving native coronary artery of native heart with angina pectoris (HCC) History of CAD status post STEMI 05/07/2016 treated with a Promus drug-eluting stent to an obtuse marginal branch.  He is discharged home 4 days later.  He was readmitted 05/24/2016 with weakness  sudden cardiac death.  He was resuscitated by his 70 year old son ROSC of that was fairly rapid.  He was catheterized by Dr. Herbie Baltimore revealing a 90% stenosis within the stented segment.  He had a prolonged hospital course with intubation and sedation as well as systemic cooling ultimately recovered full neurologic function.  Hyperlipidemia History of hyperlipidemia on high-dose statin therapy with recent lipid profile performed 01/14/2018 revealing total cholesterol 274, LDL 170 and HDL 29.  I believe he would benefit from being on Repatha.      Runell Gess MD FACP,FACC,FAHA, [facility] 03/31/2018 12:14 PM

## 2018-03-31 NOTE — Patient Instructions (Addendum)
Medication Instructions:  NONE If you need a refill on your cardiac medications before your next appointment, please call your pharmacy.   Lab work: NONE If you have labs (blood work) drawn today and your tests are completely normal, you will receive your results only by: Marland Kitchen. MyChart Message (if you have MyChart) OR . A paper copy in the mail If you have any lab test that is abnormal or we need to change your treatment, we will call you to review the results.  Testing/Procedures: Your physician has requested that you have an echocardiogram. Echocardiography is a painless test that uses sound waves to create images of your heart. It provides your doctor with information about the size and shape of your heart and how well your heart's chambers and valves are working. This procedure takes approximately one hour. There are no restrictions for this procedure.   Follow-Up: At [facility], you and your health needs are our priority.  As part of our continuing mission to provide you with exceptional heart care, we have created designated Provider Care Teams.  These Care Teams include your primary Cardiologist (physician) and Advanced Practice Providers (APPs -  Physician Assistants and Nurse Practitioners) who all work together to provide you with the care you need, when you need it. You will need a follow up appointment in 12 months.  Please call our office 2 months in advance to schedule this appointment.  You may see DR. BERRY or one of the following Advanced Practice Providers on your designated Care Team:   Corine ShelterLuke Kilroy, PA-C [place]rista Kroeger, [place] JerseyPA-C . Marjie Skiffallie Goodrich, PA-C

## 2018-03-31 NOTE — Assessment & Plan Note (Signed)
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  He is otherwise asymptomatic.  I am going to recheck a 2D echo for LV function.

## 2018-04-05 ENCOUNTER — Other Ambulatory Visit: Payer: Self-pay | Admitting: Neurology

## 2018-04-07 ENCOUNTER — Ambulatory Visit (HOSPITAL_COMMUNITY): Payer: BC Managed Care – PPO

## 2018-04-12 ENCOUNTER — Ambulatory Visit (INDEPENDENT_AMBULATORY_CARE_PROVIDER_SITE_OTHER): Payer: BC Managed Care – PPO

## 2018-04-12 DIAGNOSIS — I5022 Chronic systolic (congestive) heart failure: Secondary | ICD-10-CM

## 2018-04-12 DIAGNOSIS — I255 Ischemic cardiomyopathy: Secondary | ICD-10-CM

## 2018-04-13 ENCOUNTER — Ambulatory Visit (HOSPITAL_COMMUNITY): Payer: BC Managed Care – PPO | Attending: Internal Medicine

## 2018-04-13 ENCOUNTER — Other Ambulatory Visit: Payer: Self-pay

## 2018-04-13 DIAGNOSIS — I213 ST elevation (STEMI) myocardial infarction of unspecified site: Secondary | ICD-10-CM | POA: Diagnosis present

## 2018-04-13 NOTE — Progress Notes (Signed)
Remote ICD transmission.   

## 2018-04-16 ENCOUNTER — Telehealth: Payer: Self-pay | Admitting: Pharmacist

## 2018-04-16 MED ORDER — EVOLOCUMAB 140 MG/ML ~~LOC~~ SOAJ: 140.0000 mg | SUBCUTANEOUS | 11 refills | Status: DC

## 2018-04-16 NOTE — Telephone Encounter (Signed)
repatha PA approved by insurance. Rx sent to [facility] pharmacy. Co-pay car information provided.    Patient encouraged to call back if additional assistance needed.

## 2018-04-20 ENCOUNTER — Ambulatory Visit: Payer: BC Managed Care – PPO | Admitting: Family

## 2018-04-20 ENCOUNTER — Ambulatory Visit (HOSPITAL_COMMUNITY)
Admission: RE | Admit: 2018-04-20 | Discharge: 2018-04-20 | Disposition: A | Discharge status: Home / Self Care | Payer: BC Managed Care – PPO | Source: Ambulatory Visit | Attending: Family | Admitting: Family

## 2018-04-20 ENCOUNTER — Encounter: Payer: Self-pay | Admitting: Family

## 2018-04-20 VITALS — BP 126/84 | HR 93 | Temp 97.7°F | Resp 16 | Ht 69.0 in | Wt 210.0 lb

## 2018-04-20 DIAGNOSIS — I6522 Occlusion and stenosis of left carotid artery: Secondary | ICD-10-CM

## 2018-04-20 DIAGNOSIS — I6521 Occlusion and stenosis of right carotid artery: Secondary | ICD-10-CM

## 2018-04-20 DIAGNOSIS — Z87891 Personal history of nicotine dependence: Secondary | ICD-10-CM | POA: Diagnosis not present

## 2018-04-20 NOTE — Patient Instructions (Signed)

## 2018-04-20 NOTE — Progress Notes (Signed)
Chief Complaint: Follow up Extracranial Carotid Artery Stenosis   History of Present Illness  Douglas Carr is a 52 y.o. male with a history of right carotid endarterectomy and was hospitalized in February 2018 with a cardiac arrest and underwent hypothermia protocol at that time. At the time he was noted to have an occluded right carotid endarterectomy site with stenosis of the left ICA. He has survived that insult and is doing well AND has speech difficulties. 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 He also had an MI in 1996.   He has been unable to return to work given his neurologic sequela. He has had no frank sequela of his left carotid stenosis. He does take aspirin and statin drug. Overall he has had remarkable recovery.  Dr. Randie Heinzain last evaluated pt on 12-12-16. At that time carotid duplex confirmed a known occluded right carotid endarterectomy site with stenosis in the 60-79% range on the left although this may be falsely elevated given the contralateral occlusion. He is on aspirin and statin drugs and should remain such. Dr. Randie Heinzain discussed with pt the signs and symptoms of stroke TIA and amaurosis. Should he remain free of these we'll see him in one year with repeat carotid duplex.   He lives with his wife and children, states he forgets to take his insulin injections at times.  Diabetic: yes, A1C was >14 on 01-14-18, severely uncontrolled  Tobacco use: former smoker, quit in 2013, smoked 2 ppd x 22 years  Pt meds include: Statin : yes, he has not yet received the Repatha ASA: yes Other anticoagulants/antiplatelets: Plavix   Past Medical History:  Diagnosis Date  . Acute ST elevation myocardial infarction (STEMI) involving left circumflex coronary artery (HCC) 05/07/2016   PCI to Cx-OM  . Anginal pain (HCC)    secondary to sm. vessel disease  . Anxiety   . Arthritis    BIL KNEE PAIN AND BIL ANKLE PAIN  . Bone spur of ankle   . Cardiac arrest (HCC) 05/24/2016   with v fib  . 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 Coronary artery disease involving native coronary artery of native heart with angina pectoris (HCC) 05/24/2016   Remote MI at 52 years of age, Last cath 2010-diffuse non-obstructive disease; last echo 06/16/08 -normal LV function, moderate concentric hypertrophy; nuc 08/2008 no ischemia;  medical therapy; STEMI May 07 2016 - PCI to Cx-OM  . Diabetes mellitus    ON ORAL MEDICATION AND INSULIN  . Dilated cardiomyopathy (HCC) 05/24/2016   EF 325-30% by Echo post STEMI (previously 30-35%)   . Hyperlipidemia   . Hypertension   . Myocardial infarction (HCC) 1996   Post MI  . Peripheral vascular disease (HCC)    HAS LEFT CAROTID ARTERY STENOSIS   AND IS S/P RIGHT CAROTID ENDARTERECTOMY 2010 Last carotid dopplers 01/08/2012 wth patent endarterectomy site  . Status post coronary artery stent placement     Social History Social History   Tobacco Use  . Smoking status: Former Smoker    Packs/day: 2.00    Years: 22.00    Pack years: 44.00    Types: Cigarettes    Last attempt to quit: 10/23/2011    Years since quitting: 6.4  . Smokeless tobacco: Current User    Types: Snuff  Substance Use Topics  . Alcohol use: No  . Drug use: No    Family History Family History  Problem Relation Age  of Onset  . Hypertension Mother   . Diabetes Mother   . Hypertension Maternal Grandfather   . Heart attack Paternal Grandfather   . Heart attack Father 60    Surgical History Past Surgical History:  Procedure Laterality Date  . APPENDECTOMY    . CARDIAC CATHETERIZATION     FEB 2010, significant branch vessel disease wth diag, marginal, PDA & PLA, nml. LV function  . CARDIAC CATHETERIZATION N/A 05/07/2016   Procedure: Left Heart Cath and Coronary Angiography;  Surgeon: Peter M Swaziland, MD;  [facility] INVASIVE CV LAB;  Service: Cardiovascular;  Laterality: N/A;  . CARDIAC CATHETERIZATION N/A  05/07/2016   Procedure: Coronary Stent Intervention;  Surgeon: Peter M Swaziland, MD;  Location: [facility] INVASIVE CV LAB;  Service: Cardiovascular;  Laterality: N/A;  . CARDIAC CATHETERIZATION N/A 05/24/2016   Procedure: Left Heart Cath and Coronary Angiography;  Surgeon: Marykay Lex, MD;  [facility] IN[facility];  Service: Cardiovascular;  Laterality: N/A;  . CARDIAC CATHETERIZATION N/A 05/24/2016   Procedure: Coronary Stent Intervention;  Surgeon: Marykay Lex, MD;  Location: [facility] INVASIVE CV LAB;  Service: Cardiovascular;  Laterality: N/A;  2.5x20 Promus to Ostial/proximal circumflex  . CAROTID ENDARTERECTOMY  09/2008   Rt CEA  . ICD IMPLANT N/A 06/30/2016   Procedure: ICD Implant;  Surgeon: Will Jorja Loa, MD;  Location: [facility] INVASIVE CV LAB;  Service: Cardiovascular;  Laterality: N/A;  . LUNG BIOPSY  2013   Bx's suggest granulomatous dz.  . RT ANKLE   2013    No Known Allergies  Current Outpatient Medications  Medication Sig Dispense Refill  . acetaminophen (TYLENOL) 325 MG tablet Take 2 tablets (650 mg total) by mouth every 4 (four) hours as needed for headache or mild pain.    Marland Kitchen aspirin EC 81 MG tablet Take 81 mg by mouth daily.    Marland Kitchen atorvastatin (LIPITOR) 80 MG tablet Take 1 tablet (80 mg total) by mouth daily. 90 tablet 3  . carvedilol (COREG) 6.25 MG tablet Take 1 tablet (6.25 mg total) by mouth 2 (two) times daily. 180 tablet 3  . clonazePAM (KLONOPIN) 0.5 MG tablet Take 1 tablet (0.5 mg total) by mouth 2 (two) times daily. 60 tablet 1  . clopidogrel (PLAVIX) 75 MG tablet TAKE 1 TABLET BY MOUTH EVERY DAY IN THE EVENING 90 tablet 2  . Continuous Blood Gluc Receiver (FREESTYLE LIBRE 14 DAY READER) DEVI 1 each by Does not apply route 4 (four) times daily. 1 Device 1  . Continuous Blood Gluc Sensor (FREESTYLE LIBRE 14 DAY SENSOR) MISC 1 each by Does not apply route every 14 (fourteen) days. 7 each 3  . DULoxetine (CYMBALTA) 30 MG capsule TAKE 1 CAPSULE BY MOUTH EVERY DAY 90 capsule 1   . Evolocumab (REPATHA SURECLICK) 140 MG/ML SOAJ Inject 140 mg into the skin every 14 (fourteen) days. 2 pen 11  . ibuprofen (ADVIL,MOTRIN) 200 MG tablet Take 200 mg by mouth every 6 (six) hours as needed for moderate pain.    . Insulin Degludec (TRESIBA FLEXTOUCH) 200 UNIT/ML SOPN Inject 116 Units into the skin daily. 26 mL 1  . lisinopril (PRINIVIL,ZESTRIL) 2.5 MG tablet Take 1 tablet (2.5 mg total) by mouth daily. 90 tablet 3  . meloxicam (MOBIC) 15 MG tablet Take 1 tablet (15 mg total) by mouth daily. 30 tablet 0  . metFORMIN (GLUCOPHAGE) 500 MG tablet Take 1 tablet (500 mg total) by mouth 2 (two) times daily. 180 tablet 2  . mirtazapine (REMERON)  7.5 MG tablet TAKE 1 TABLET AT BEDTIME FOR DEPRESSION (NEEDS TO BE SEEN) 90 tablet 1  . ONETOUCH VERIO test strip TEST TWICE DAILY 100 each 11  . potassium chloride SA (KLOR-CON M20) 20 MEQ tablet Take 1 tablet (20 mEq total) by mouth daily. 90 tablet 1  . primidone (MYSOLINE) 50 MG tablet TAKE 1 TABLET BY MOUTH EVERYDAY AT BEDTIME 90 tablet 1  . tamsulosin (FLOMAX) 0.4 MG CAPS capsule TAKE 1 CAPSULE DAILY IN THE EVENING. NEEDS TO BE SEEN 90 capsule 1  . torsemide (DEMADEX) 20 MG tablet TAKE 1 TABLET (20 MG TOTAL) BY MOUTH DAILY. (NEEDS TO BE SEEN) 90 tablet 1   No current facility-administered medications for this visit.     Review of Systems : See HPI for pertinent positives and negatives.  Physical Examination  Vitals:   04/20/18 1256 04/20/18 1304  BP: 132/86 126/84  Pulse: 93   Resp: 16   Temp: 97.7 F (36.5 C)   SpO2: 99%   Weight: 210 lb (95.3 kg)   Height: 5\' 9"  (1.753 m)    Body mass index is 31.01 kg/m.  General: WDWN obese male in NAD GAIT: normal Eyes: PERRLA HENT: No gross abnormalities.  Pulmonary:  Respirations are non-labored, good air movement in all fields, CTAB, no rales, rhonchi, or wheezing. Cardiac: regular rhythm, no detected murmur. Pacemaker/AICD palpated left upper chest  VASCULAR EXAM Carotid Bruits  Right Left   Negative Negative     Abdominal aortic pulse is not palpable. Radial pulses are not palpable, bilateral brachial pulses are 2+ palpable                                                                                                                         LE Pulses Right Left       FEMORAL  1+ palpable  3+ palpable        POPLITEAL  not palpable   1+ palpable       POSTERIOR TIBIAL  1+ palpable   not palpable        DORSALIS PEDIS      ANTERIOR TIBIAL 1+ palpable  1+ palpable     Gastrointestinal: soft, nontender, BS WNL, no r/g, no palpable masses. Musculoskeletal: no muscle atrophy/wasting. M/S 5/5 throughout, extremities without ischemic changes. Skin: No rashes, no ulcers, no cellulitis.   Neurologic:  A&O X 3; appropriate affect, sensation is normal; speech is dysarthric, CN 2-12 intact except has slight droop at right side of mouth, pain and light touch intact in extremities, motor exam as listed above. Psychiatric: Normal thought content, mood appropriate to clinical situation.    Assessment: Douglas Carr is a 52 y.o. male with a history of right carotid endarterectomy and was hospitalized in February 2018 with a cardiac arrest and underwent hypothermia protocol at that time. At the time he was noted to have an occluded right carotid endarterectomy site with stenosis of the left ICA. He has survived that insult  and is doing well AND has speech difficulties. 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 He also had an MI in 1996.   He has been unable to return to work given his neurologic sequela. He has had no frank sequela of his left carotid stenosis. He does take aspirin and statin drug.   His atherosclerotic risk factors include continued uncontrolled DM (he forgets to take his insulin sometimes), 22 years hx of smoking (quit in 2013), CAD with hx of 2 MI's, and obesity.    DATA 04-20-18 Right Carotid: Evidence consistent with a total occlusion of  the right ICA.    Left Carotid: Velocities in the left ICA are consistent with a 40-59% stenosis.    Vertebrals: Right vertebral artery demonstrates an occlusion. Left vertebral        artery was not visualized.  Subclavians: Normal flow hemodynamics were seen in bilateral subclavian        arteries.  Less stenosis in the left ICA compared to the exam on 12-12-16.      Plan: Follow-up in 6 months with Carotid Duplex scan.   I discussed in depth with the patient the nature of atherosclerosis, and emphasized the importance of maximal medical management including strict control of blood pressure, blood glucose, and lipid levels, obtaining regular exercise, and continued cessation of smoking.  The patient is aware that without maximal medical management the underlying atherosclerotic disease process will progress, limiting the benefit of any interventions. The patient was given information about stroke prevention and what symptoms should prompt the patient to seek immediate medical care. Thank you for allowing [place] to participate in this patient's care.  Charisse March, RN, MSN, FNP-C [facility] Office: [phone]  Clinic Physician: Chestine Spore  04/20/18 1:07 PM

## 2018-04-21 ENCOUNTER — Other Ambulatory Visit: Payer: Self-pay | Admitting: Family Medicine

## 2018-04-21 DIAGNOSIS — M25551 Pain in right hip: Secondary | ICD-10-CM

## 2018-04-21 DIAGNOSIS — M25552 Pain in left hip: Principal | ICD-10-CM

## 2018-05-26 LAB — CUP PACEART REMOTE DEVICE CHECK
Battery Remaining Longevity: 127 mo
Battery Voltage: 3.03 V
Brady Statistic RV Percent Paced: 0.01 %
Date Time Interrogation Session: 20191209122309
HighPow Impedance: 71 Ohm
Implantable Lead Location: 753860
Lead Channel Impedance Value: 304 Ohm
Lead Channel Impedance Value: 399 Ohm
Lead Channel Pacing Threshold Amplitude: 1 V
Lead Channel Pacing Threshold Pulse Width: 0.4 ms
Lead Channel Sensing Intrinsic Amplitude: 13.5 mV
Lead Channel Sensing Intrinsic Amplitude: 13.5 mV
Lead Channel Setting Pacing Amplitude: 2.5 V
Lead Channel Setting Pacing Pulse Width: 0.4 ms
Lead Channel Setting Sensing Sensitivity: 0.3 mV
MDC IDC LEAD IMPLANT DT: 20180226
MDC IDC PG IMPLANT DT: 20180226

## 2018-06-01 NOTE — Progress Notes (Signed)
Subjective:   Douglas Carr was seen in consultation in the movement disorder clinic at the request of [name], [name], MD.  Multiple hospital notes have been reviewed.  Pt accompanied by wife who supplements the history.  The patient was admitted for a lengthy hospital stay for V. fib arrest in January, 2018.  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  Wife and patient report that this movement is better.  The patient had a defibrillator placed in February, 2018.  Following the defibrillator placement, the patient reported that his tremor, which he had at baseline, got much worse.   Wife and patient state that he really didn't have tremor at baseline.  He was evaluated by neurology and it was felt that this could be either an unmasking of his baseline tremor or worsening of the myoclonus.  It was recommended that he stay on Keppra and add clonazepam.  He takes it twice per day.  It works but if he is stressed out he will note tremor.  If he has tremor now, it is whole body but legs more than arms.  No preference for one side of body.  More at night than during the day.  It may last for several hours and then will go away.  No family hx of tremor.    Current/Previously tried tremor medications: n/a  Current medications that may exacerbate tremor:  n/a  Outside reports reviewed: historical medical records, radiology reports and referral letter/letters.MRI brain done 06/02/16 and demonstrated L thalamic infarct (acute).  I did review this personally.  01/22/17 update:  Pt seen in follow up.  On keppra for post anoxic myoclonus and doing well in that regard.   Wife does not notice any jerking but patient states that he still has some.   He also has tremor for which he is on klonopin. Tremor is coming and going but seems to be a bit worse.   This got worse after defib placed in 06/2016.  He is on klonopin 0.5 mg bid and he was told  he could take an extra 1/2 tablet prn.  The records that were made available to me were reviewed.  Did not go back to work, which resulted in him having to apply for disability.  Some depression associated with inability to work.  No SI/HI.  Started on cymbalta by PCP.  Recently fell in the house.  Just lost his balance and hit back against door jam.  Last PT was 2 months ago.  Last ST was about 6 months ago.  09/01/17 update: Patient is seen today in follow-up.  Wife accompanies him, who supplements the history.   I decreased his Keppra last visit to 500 mg twice daily.  He is not having myoclonus.  He was started on primidone for tremor.   He is doing better with tremor.   He is also on clonazepam 0.5 mg twice per day for tremor.  The records that were made available to me were reviewed.  Has been going to PT. balance is getting better with this.  Seen in the emergency room in January for near syncope.  It was felt that it was possible that the patient took too much pain medication.  An MRI of the brain was completed on May 20, 2017 during that emergency room stay and demonstrated atrophy, more than one would expect for age and old bilateral parietal and right frontal lobe infarcts.  I personally reviewed this and most of atrophy was in cerebellar hemispheres. There was nothing acute.  06/02/18 update: Patient is seen today in follow-up for his history of post anoxic myoclonus.  He has been weaned off of his Keppra (I believe.  He doesn't know his meds and wife not present today to confirm).  He is on primidone, 50 mg at night (again, should be - doesn't know his medications).  He takes clonazepam for the movements.  PMP aware is reviewed.  I RX it on 9/11 and then PCP did on 9/12 but still only receiving appropriate number per month by pharmacy.  Patient states that his mother died yesterday.  No Known Allergies  Outpatient Encounter Medications as of 06/02/2018  Medication Sig  . acetaminophen  (TYLENOL) 325 MG tablet Take 2 tablets (650 mg total) by mouth every 4 (four) hours as needed for headache or mild pain.  Marland Kitchen aspirin EC 81 MG tablet Take 81 mg by mouth daily.  Marland Kitchen atorvastatin (LIPITOR) 80 MG tablet Take 1 tablet (80 mg total) by mouth daily.  . carvedilol (COREG) 6.25 MG tablet Take 1 tablet (6.25 mg total) by mouth 2 (two) times daily.  . clonazePAM (KLONOPIN) 0.5 MG tablet Take 1 tablet (0.5 mg total) by mouth 2 (two) times daily.  . clopidogrel (PLAVIX) 75 MG tablet TAKE 1 TABLET BY MOUTH EVERY DAY IN THE EVENING  . Continuous Blood Gluc Receiver (FREESTYLE LIBRE 14 DAY READER) DEVI 1 each by Does not apply route 4 (four) times daily.  . Continuous Blood Gluc Sensor (FREESTYLE LIBRE 14 DAY SENSOR) MISC 1 each by Does not apply route every 14 (fourteen) days.  . DULoxetine (CYMBALTA) 30 MG capsule TAKE 1 CAPSULE BY MOUTH EVERY DAY  . Evolocumab (REPATHA SURECLICK) 140 MG/ML SOAJ Inject 140 mg into the skin every 14 (fourteen) days.  Marland Kitchen ibuprofen (ADVIL,MOTRIN) 200 MG tablet Take 200 mg by mouth every 6 (six) hours as needed for moderate pain.  . Insulin Degludec (TRESIBA FLEXTOUCH) 200 UNIT/ML SOPN Inject 116 Units into the skin daily.  Marland Kitchen lisinopril (PRINIVIL,ZESTRIL) 2.5 MG tablet Take 1 tablet (2.5 mg total) by mouth daily.  . meloxicam (MOBIC) 15 MG tablet TAKE 1 TABLET BY MOUTH EVERY DAY  . metFORMIN (GLUCOPHAGE) 500 MG tablet Take 1 tablet (500 mg total) by mouth 2 (two) times daily.  . mirtazapine (REMERON) 7.5 MG tablet TAKE 1 TABLET AT BEDTIME FOR DEPRESSION (NEEDS TO BE SEEN)  . ONETOUCH VERIO test strip TEST TWICE DAILY  . potassium chloride SA (KLOR-CON M20) 20 MEQ tablet Take 1 tablet (20 mEq total) by mouth daily.  . primidone (MYSOLINE) 50 MG tablet TAKE 1 TABLET BY MOUTH EVERYDAY AT BEDTIME  . tamsulosin (FLOMAX) 0.4 MG CAPS capsule TAKE 1 CAPSULE DAILY IN THE EVENING. NEEDS TO BE SEEN  . torsemide (DEMADEX) 20 MG tablet TAKE 1 TABLET (20 MG TOTAL) BY MOUTH DAILY.  (NEEDS TO BE SEEN)   No facility-administered encounter medications on file as of 06/02/2018.     Past Medical History:  Diagnosis Date  . Acute ST elevation myocardial infarction (STEMI) involving left circumflex coronary artery (HCC) 05/07/2016   PCI to Cx-OM  . Anginal pain (HCC)    secondary to sm. vessel disease  . Anxiety   . Arthritis    BIL KNEE PAIN AND BIL ANKLE PAIN  . Bone spur of ankle   . Cardiac arrest (HCC) 05/24/2016   with v fib  . Chronic combined systolic and  diastolic CHF, NYHA class 2 and ACA/AHA stage C (HCC) 05/13/2016  . Coronary artery disease involving native coronary artery of native heart with angina pectoris (HCC) 05/24/2016   Remote MI at 53 years of age, Last cath 2010-diffuse non-obstructive disease; last echo 06/16/08 -normal LV function, moderate concentric hypertrophy; nuc 08/2008 no ischemia;  medical therapy; STEMI May 07 2016 - PCI to Cx-OM  . Diabetes mellitus    ON ORAL MEDICATION AND INSULIN  . Dilated cardiomyopathy (HCC) 05/24/2016   EF 325-30% by Echo post STEMI (previously 30-35%)   . Hyperlipidemia   . Hypertension   . Myocardial infarction (HCC) 1996   Post MI  . Peripheral vascular disease (HCC)    HAS LEFT CAROTID ARTERY STENOSIS   AND IS S/P RIGHT CAROTID ENDARTERECTOMY 2010 Last carotid dopplers 01/08/2012 wth patent endarterectomy site  . Status post coronary artery stent placement     Past Surgical History:  Procedure Laterality Date  . APPENDECTOMY    . CARDIAC CATHETERIZATION     FEB 2010, significant branch vessel disease wth diag, marginal, PDA & PLA, nml. LV function  . CARDIAC CATHETERIZATION N/A 05/07/2016   Procedure: Left Heart Cath and Coronary Angiography;  Surgeon: Peter M Swaziland, MD;  Location: [facility] INVASIVE CV LAB;  Service: Cardiovascular;  Laterality: N/A;  . CARDIAC CATHETERIZATION N/A 05/07/2016   Procedure: Coronary Stent Intervention;  Surgeon: Peter M Swaziland, MD;  Location: [facility] INVASIVE CV LAB;  Service:  Cardiovascular;  Laterality: N/A;  . CARDIAC CATHETERIZATION N/A 05/24/2016   Procedure: Left Heart Cath and Coronary Angiography;  Surgeon: Marykay Lex, MD;  Location: [facility] INVASIVE CV LAB;  Service: Cardiovascular;  Laterality: N/A;  . CARDIAC CATHETERIZATION N/A 05/24/2016   Procedure: Coronary Stent Intervention;  Surgeon: Marykay Lex, MD;  Location: [facility] INVASIVE CV LAB;  Service: Cardiovascular;  Laterality: N/A;  2.5x20 Promus to Ostial/proximal circumflex  . CAROTID ENDARTERECTOMY  09/2008   Rt CEA  . ICD IMPLANT N/A 06/30/2016   Procedure: ICD Implant;  Surgeon: Will Jorja Loa, MD;  Location: [facility] INVASIVE CV LAB;  Service: Cardiovascular;  Laterality: N/A;  . LUNG BIOPSY  2013   Bx's suggest granulomatous dz.  . RT ANKLE   2013    Social History   Socioeconomic History  . Marital status: Married    Spouse name: Erskine Squibb  . Number of children: 3  . Years of education: Not on file  . Highest education level: Not on file  Occupational History  . Occupation: Midwife for [employer]    Employer: [employer]   Social Needs  . Financial resource strain: Not on file  . Food insecurity:    Worry: Not on file    Inability: Not on file  . Transportation needs:    Medical: Not on file    Non-medical: Not on file  Tobacco Use  . Smoking status: Former Smoker    Packs/day: 2.00    Years: 22.00    Pack years: 44.00    Types: Cigarettes    Last attempt to quit: 10/23/2011    Years since quitting: 6.6  . Smokeless tobacco: Current User    Types: Snuff  Substance and Sexual Activity  . Alcohol use: No  . Drug use: No  . Sexual activity: Not on file  Lifestyle  . Physical activity:    Days per week: Not on file    Minutes per session: Not on file  . Stress: Not on file  Relationships  . Social  connections:    Talks on phone: Not on file    Gets together: Not on file    Attends religious service: Not on file    Active member of club or organization: Not on file    Attends meetings  of clubs or organizations: Not on file    Relationship status: Not on file  . Intimate partner violence:    Fear of current or ex partner: Not on file    Emotionally abused: Not on file    Physically abused: Not on file    Forced sexual activity: Not on file  Other Topics Concern  . Not on file  Social History Narrative   Pt lives with family in [place], [place].    Family Status  Relation Name Status  . Mother  Alive  . MGM  Deceased at age 52  . MGF  Deceased at age 52  . PGF  Deceased at age 52   . Brother  Alive  . PGM  Deceased at age 52  . Father  Deceased  . Child x3 Alive    Review of Systems Review of Systems  Constitutional: Negative.   HENT: Negative.   Genitourinary: Negative.   Skin: Negative.   Neurological:       Occasional jerking      Objective:   VITALS:   Vitals:   06/02/18 1521  BP: 100/70  Pulse: (!) 102  SpO2: 100%  Weight: 214 lb 2 oz (97.1 kg)  Height: 5\' 10"  (1.778 m)   Gen:  Appears stated age and in NAD.  Tearful when he discusses his mom. HEENT:  Normocephalic, atraumatic. The mucous membranes are moist. The superficial temporal arteries are without ropiness or tenderness. Cardiovascular: tachy.  regular Lungs: Clear to auscultation bilaterally. Neck: There are no carotid bruits noted bilaterally.  NEUROLOGICAL:  Orientation:  The patient is alert and oriented x 3.   Cranial nerves: There is good facial symmetry.  Marland Kitchen. Speech is fluent, but has a pseudobulbar quality and mildly dysarthric. Soft palate rises symmetrically and there is no tongue deviation. Hearing is intact to conversational tone. Tone: Tone is normal. Sensation: Sensation is intact to light touch touch throughout Coordination:  The patient has no trouble with FNF, with eyes open or closed. Motor: Strength is 5/5 in the bilateral upper and lower extremities.  He has a very subtle right pronator drift. Gait and Station: The patient gets out of the chair easily.  He slightly  drags the right leg with ambulation.  MOVEMENT EXAM: Abnormal movements: There is no myoclonus.  There is no asterixis.  There is no tremor, either with rest, posture, or intention.  Lab Results  Component Value Date   HGBA1C >14.0 (H) 01/14/2018        Assessment/Plan:   1.  Abnormal movements  -I believe he is now off of the Keppra and doing well.  He will remain off of that.  -explained to patient that I can no longer RX klonopin due to multiple prescribers.  I last sent RX 9/11 and then PCP sent RX on 9/12.  I will no longer be able to prescribe that, but I also a.m. not sure that he needs that any longer.  I no longer see the movements.  I do not think he has postanoxic myoclonus any longer.  -He thinks primidone has been helpful, but that being said he is not even sure if he is on it (wife does his medication and is not  present today).  In the future, it may be worth trying to get him off of that, for sake of medication simplification and see how he does.  2.  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 fib arrest.  It appears that there is also a lesion in left STN.  Talked about stroke signs and symptoms.    3.  Patient no longer needs to follow with neurology.  Medications can be prescribed by primary care (clonazepam already is).  He will be discharged to the full care of his primary care physician.     CC:  [name], Elige RadonJoshua A, MD

## 2018-06-02 ENCOUNTER — Ambulatory Visit (INDEPENDENT_AMBULATORY_CARE_PROVIDER_SITE_OTHER): Payer: BC Managed Care – PPO | Admitting: Neurology

## 2018-06-02 ENCOUNTER — Encounter: Payer: Self-pay | Admitting: Neurology

## 2018-06-02 VITALS — BP 100/70 | HR 102 | Ht 70.0 in | Wt 214.1 lb

## 2018-06-02 DIAGNOSIS — R471 Dysarthria and anarthria: Secondary | ICD-10-CM

## 2018-06-02 DIAGNOSIS — R251 Tremor, unspecified: Secondary | ICD-10-CM | POA: Diagnosis not present

## 2018-06-03 ENCOUNTER — Ambulatory Visit: Payer: BC Managed Care – PPO | Admitting: Neurology

## 2018-06-15 ENCOUNTER — Telehealth: Payer: Self-pay | Admitting: Family Medicine

## 2018-06-15 NOTE — Telephone Encounter (Signed)
Yes patient has weakness and tremors secondary to history of anoxic brain injury, and a am in agreement to do a letter saying that he is unable to work currently

## 2018-06-16 NOTE — Telephone Encounter (Signed)
Letter ready for pick up.  Wife aware

## 2018-06-17 ENCOUNTER — Telehealth: Payer: Self-pay

## 2018-06-17 DIAGNOSIS — E785 Hyperlipidemia, unspecified: Secondary | ICD-10-CM

## 2018-06-17 NOTE — Telephone Encounter (Signed)
Called pt to see how they were tolerating repatha pt stated that they have only had one dose labs were ordered for April lipids for 8 week post 1st dose

## 2018-07-01 ENCOUNTER — Ambulatory Visit: Payer: BC Managed Care – PPO | Admitting: Family Medicine

## 2018-07-01 ENCOUNTER — Encounter: Payer: Self-pay | Admitting: Family Medicine

## 2018-07-01 VITALS — BP 180/97 | HR 101 | Temp 97.8°F | Ht 70.0 in | Wt 216.8 lb

## 2018-07-01 DIAGNOSIS — E118 Type 2 diabetes mellitus with unspecified complications: Secondary | ICD-10-CM | POA: Diagnosis not present

## 2018-07-01 DIAGNOSIS — E1169 Type 2 diabetes mellitus with other specified complication: Secondary | ICD-10-CM

## 2018-07-01 DIAGNOSIS — F419 Anxiety disorder, unspecified: Secondary | ICD-10-CM

## 2018-07-01 DIAGNOSIS — I25119 Atherosclerotic heart disease of native coronary artery with unspecified angina pectoris: Secondary | ICD-10-CM

## 2018-07-01 DIAGNOSIS — I10 Essential (primary) hypertension: Secondary | ICD-10-CM

## 2018-07-01 DIAGNOSIS — E782 Mixed hyperlipidemia: Secondary | ICD-10-CM

## 2018-07-01 DIAGNOSIS — F329 Major depressive disorder, single episode, unspecified: Secondary | ICD-10-CM

## 2018-07-01 DIAGNOSIS — F32A Depression, unspecified: Secondary | ICD-10-CM

## 2018-07-01 DIAGNOSIS — E669 Obesity, unspecified: Secondary | ICD-10-CM

## 2018-07-01 MED ORDER — INSULIN DEGLUDEC 200 UNIT/ML ~~LOC~~ SOPN: 115.0000 [IU] | PEN_INJECTOR | Freq: Every day | SUBCUTANEOUS | 5 refills | Status: DC

## 2018-07-01 MED ORDER — DULOXETINE HCL 60 MG PO CPEP: ORAL_CAPSULE | ORAL | 1 refills | Status: DC

## 2018-07-01 MED ORDER — TAMSULOSIN HCL 0.4 MG PO CAPS: ORAL_CAPSULE | ORAL | 3 refills | Status: DC

## 2018-07-01 MED ORDER — MIRTAZAPINE 7.5 MG PO TABS: ORAL_TABLET | ORAL | 1 refills | Status: DC

## 2018-07-01 MED ORDER — POTASSIUM CHLORIDE CRYS ER 20 MEQ PO TBCR: 20.0000 meq | EXTENDED_RELEASE_TABLET | Freq: Every day | ORAL | 3 refills | Status: DC

## 2018-07-01 MED ORDER — TORSEMIDE 20 MG PO TABS: 20.0000 mg | ORAL_TABLET | Freq: Every day | ORAL | 3 refills | Status: DC

## 2018-07-01 MED ORDER — CLONAZEPAM 0.5 MG PO TABS: 0.5000 mg | ORAL_TABLET | Freq: Two times a day (BID) | ORAL | 1 refills | Status: DC

## 2018-07-01 NOTE — Progress Notes (Signed)
BP (!) 180/97   Pulse (!) 101   Temp 97.8 F (36.6 C) (Oral)   Ht _0  (1.778 m)   Wt 216 lb 12.8 oz (98.3 kg)   BMI 31.11 kg/m    Subjective:    Patient ID: Douglas Carr, male    DOB: 05/09/1965, 53 y.o.   MRN: [id number]  HPI: Douglas Carr is a 53 y.o. male presenting on 07/01/2018 for Diabetes (3 month follow up); Hypertension; Hyperlipidemia; and Depression   HPI Type 2 diabetes mellitus Patient comes in today for recheck of his diabetes. Patient has been currently taking [place] and metformin. Patient is currently on an ACE inhibitor/ARB. Patient has not seen an ophthalmologist this year. Patient denies any issues with their feet.   Hypertension Patient is currently on lisinopril and carvedilol, and their blood pressure today is 180/97. Patient denies any lightheadedness or dizziness. Patient denies headaches, blurred vision, chest pains, shortness of breath, or weakness. Denies any side effects from medication and is content with current medication.   Hyperlipidemia Patient is coming in for recheck of his hyperlipidemia. The patient is currently taking Repatha and Lipitor. They deny any issues with myalgias or history of liver damage from it. They deny any focal numbness or weakness or chest pain.   Depression Patient comes in to discuss anxiety and depression.  Per the questionnaire he is not been doing as well wife says he has been more down recently and not having as much energy.  Patient has been on the Remeron and the low-dose of Cymbalta of 30 and just this is not working for him as well anymore.  He denies any suicidal ideations but does often have times that he would feel better off guilty and being dead.  He has no plans for suicide or no plans to hurt himself he is here seeking help and seeing what we can do for him today.  A lot of his health issues are also playing into this as well. Depression screen [facility] 2/9 07/01/2018 01/14/2018 05/29/2017 12/22/2016  11/19/2016  Decreased Interest 3 0 0 0 1  Down, Depressed, Hopeless 3 0 0 0 1  PHQ - 2 Score 6 0 0 0 2  Altered sleeping 2 - - - 2  Tired, decreased energy 3 - - - 2  Change in appetite 2 - - - 1  Feeling bad or failure about yourself  3 - - - 2  Trouble concentrating 2 - - - 2  Moving slowly or fidgety/restless 3 - - - 2  Suicidal thoughts 1 - - - 2  PHQ-9 Score 22 - - - 15  Difficult doing work/chores - - - - Very difficult    Relevant past medical, surgical, family and social history reviewed and updated as indicated. Interim medical history since our last visit reviewed. Allergies and medications reviewed and updated.  Review of Systems  Constitutional: Negative for chills and fever.  Eyes: Negative for discharge.  Respiratory: Negative for shortness of breath and wheezing.   Cardiovascular: Negative for chest pain and leg swelling.  Musculoskeletal: Negative for back pain and gait problem.  Skin: Negative for rash.  Psychiatric/Behavioral: Positive for decreased concentration. Negative for self-injury, sleep disturbance and suicidal ideas. The patient is nervous/anxious.   All other systems reviewed and are negative.   Per HPI unless specifically indicated above   Allergies as of 07/01/2018   No Known Allergies     Medication List  Accurate as of July 01, 2018  2:35 PM. Always use your most recent med list.        acetaminophen 325 MG tablet Commonly known as:  TYLENOL Take 2 tablets (650 mg total) by mouth every 4 (four) hours as needed for headache or mild pain.   aspirin EC 81 MG tablet Take 81 mg by mouth daily.   atorvastatin 80 MG tablet Commonly known as:  LIPITOR Take 1 tablet (80 mg total) by mouth daily.   carvedilol 6.25 MG tablet Commonly known as:  COREG Take 1 tablet (6.25 mg total) by mouth 2 (two) times daily.   clonazePAM 0.5 MG tablet Commonly known as:  KLONOPIN Take 1 tablet (0.5 mg total) by mouth 2 (two) times daily.     clopidogrel 75 MG tablet Commonly known as:  PLAVIX TAKE 1 TABLET BY MOUTH EVERY DAY IN THE EVENING   DULoxetine 30 MG capsule Commonly known as:  CYMBALTA TAKE 1 CAPSULE BY MOUTH EVERY DAY   Evolocumab 140 MG/ML Soaj Commonly known as:  REPATHA SURECLICK Inject 510 mg into the skin every 14 (fourteen) days.   FREESTYLE LIBRE 14 DAY READER Devi 1 each by Does not apply route 4 (four) times daily.   FREESTYLE LIBRE 14 DAY SENSOR Misc 1 each by Does not apply route every 14 (fourteen) days.   ibuprofen 200 MG tablet Commonly known as:  ADVIL,MOTRIN Take 200 mg by mouth every 6 (six) hours as needed for moderate pain.   Insulin Degludec 200 UNIT/ML Sopn Commonly known as:  TRESIBA FLEXTOUCH Inject 116 Units into the skin daily.   lisinopril 2.5 MG tablet Commonly known as:  PRINIVIL,ZESTRIL Take 1 tablet (2.5 mg total) by mouth daily.   metFORMIN 500 MG tablet Commonly known as:  GLUCOPHAGE Take 1 tablet (500 mg total) by mouth 2 (two) times daily.   mirtazapine 7.5 MG tablet Commonly known as:  REMERON TAKE 1 TABLET AT BEDTIME FOR DEPRESSION (NEEDS TO BE SEEN)   ONETOUCH VERIO test strip Generic drug:  glucose blood TEST TWICE DAILY   potassium chloride SA 20 MEQ tablet Commonly known as:  KLOR-CON M20 Take 1 tablet (20 mEq total) by mouth daily.   primidone 50 MG tablet Commonly known as:  MYSOLINE TAKE 1 TABLET BY MOUTH EVERYDAY AT BEDTIME   tamsulosin 0.4 MG Caps capsule Commonly known as:  FLOMAX TAKE 1 CAPSULE DAILY IN THE EVENING. NEEDS TO BE SEEN   torsemide 20 MG tablet Commonly known as:  DEMADEX TAKE 1 TABLET (20 MG TOTAL) BY MOUTH DAILY. (NEEDS TO BE SEEN)          Objective:    BP (!) 180/97   Pulse (!) 101   Temp 97.8 F (36.6 C) (Oral)   Ht _0  (1.778 m)   Wt 216 lb 12.8 oz (98.3 kg)   BMI 31.11 kg/m   Wt Readings from Last 3 Encounters:  07/01/18 216 lb 12.8 oz (98.3 kg)  06/02/18 214 lb 2 oz (97.1 kg)  04/20/18 210 lb (95.3  kg)    Physical Exam Vitals signs and nursing note reviewed.  Constitutional:      General: He is not in acute distress.    Appearance: He is well-developed. He is not diaphoretic.  Eyes:     General: No scleral icterus.    Conjunctiva/sclera: Conjunctivae normal.  Neck:     Musculoskeletal: Neck supple.     Thyroid: No thyromegaly.  Cardiovascular:     Rate  and Rhythm: Normal rate and regular rhythm.     Heart sounds: Normal heart sounds. No murmur.  Pulmonary:     Effort: Pulmonary effort is normal. No respiratory distress.     Breath sounds: Normal breath sounds. No wheezing.  Lymphadenopathy:     Cervical: No cervical adenopathy.  Skin:    General: Skin is warm and dry.     Findings: No rash.  Neurological:     Mental Status: He is alert and oriented to person, place, and time.     Coordination: Coordination normal.  Psychiatric:        Mood and Affect: Mood is anxious and depressed.        Behavior: Behavior normal.        Thought Content: Thought content does not include suicidal ideation. Thought content does not include suicidal plan.        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)   Relevant Medications   torsemide (DEMADEX) 20 MG tablet     Endocrine   Diabetes mellitus type 2 in obese (HCC)   Relevant Medications   Insulin Degludec (TRESIBA FLEXTOUCH) 200 UNIT/ML SOPN   Other Relevant Orders   CMP14+EGFR (Completed)   Diabetes mellitus with complication (HCC)   Relevant Medications   Insulin Degludec (TRESIBA FLEXTOUCH) 200 UNIT/ML SOPN   Other Relevant Orders   CMP14+EGFR (Completed)     Other   Anxiety and depression   Relevant  Medications   DULoxetine (CYMBALTA) 60 MG capsule   clonazePAM (KLONOPIN) 0.5 MG tablet   mirtazapine (REMERON) 7.5 MG tablet   Hyperlipidemia - Primary   Relevant Medications   torsemide (DEMADEX) 20 MG tablet       Follow up plan: No follow-ups on file.  Counseling provided for all of the vaccine components No orders of the defined types were placed in this encounter.   Caryl Pina, MD [facility] 07/01/2018, 2:35 PM

## 2018-07-02 LAB — CMP14+EGFR
ALK PHOS: 79 IU/L (ref 39–117)
ALT: 15 IU/L (ref 0–44)
AST: 14 IU/L (ref 0–40)
Albumin/Globulin Ratio: 1.6 (ref 1.2–2.2)
Albumin: 4.4 g/dL (ref 3.8–4.9)
BUN/Creatinine Ratio: 21 — ABNORMAL HIGH (ref 9–20)
BUN: 19 mg/dL (ref 6–24)
Bilirubin Total: <0.2 mg/dL (ref 0.0–1.2)
CO2: 27 mmol/L (ref 20–29)
Calcium: 9.9 mg/dL (ref 8.7–10.2)
Chloride: 98 mmol/L (ref 96–106)
Creatinine, Ser: 0.9 mg/dL (ref 0.76–1.27)
GFR calc Af Amer: 114 mL/min/{1.73_m2} (ref >59)
GFR calc non Af Amer: 99 mL/min/{1.73_m2} (ref >59)
Globulin, Total: 2.8 g/dL (ref 1.5–4.5)
Glucose: 212 mg/dL — ABNORMAL HIGH (ref 65–99)
Potassium: 4.8 mmol/L (ref 3.5–5.2)
SODIUM: 141 mmol/L (ref 134–144)
Total Protein: 7.2 g/dL (ref 6.0–8.5)

## 2018-07-05 ENCOUNTER — Other Ambulatory Visit: Payer: Self-pay | Admitting: Pharmacist

## 2018-07-05 DIAGNOSIS — E785 Hyperlipidemia, unspecified: Secondary | ICD-10-CM

## 2018-07-06 ENCOUNTER — Telehealth: Payer: Self-pay

## 2018-07-06 NOTE — Telephone Encounter (Signed)
Spoke w/pt's wife  regarding needing the pt to come in for labs for post first dose of repatha med pt's wife stated that she would have him come fasting asap I told her that an appt isn't needed walk ins are welcome

## 2018-07-12 ENCOUNTER — Ambulatory Visit (INDEPENDENT_AMBULATORY_CARE_PROVIDER_SITE_OTHER): Payer: BC Managed Care – PPO | Admitting: *Deleted

## 2018-07-12 DIAGNOSIS — I255 Ischemic cardiomyopathy: Secondary | ICD-10-CM

## 2018-07-12 DIAGNOSIS — I5022 Chronic systolic (congestive) heart failure: Secondary | ICD-10-CM

## 2018-07-13 ENCOUNTER — Telehealth: Payer: Self-pay

## 2018-07-13 LAB — CUP PACEART REMOTE DEVICE CHECK
Battery Remaining Longevity: 125 mo
Battery Voltage: 3.03 V
Brady Statistic RV Percent Paced: 0 %
Date Time Interrogation Session: 20200309062703
HIGH POWER IMPEDANCE MEASURED VALUE: 77 Ohm
Implantable Lead Implant Date: 20180226
Implantable Pulse Generator Implant Date: 20180226
Lead Channel Impedance Value: 304 Ohm
Lead Channel Impedance Value: 399 Ohm
Lead Channel Pacing Threshold Amplitude: 0.875 V
Lead Channel Pacing Threshold Pulse Width: 0.4 ms
Lead Channel Sensing Intrinsic Amplitude: 14.5 mV
Lead Channel Sensing Intrinsic Amplitude: 14.5 mV
Lead Channel Setting Pacing Amplitude: 2.5 V
Lead Channel Setting Pacing Pulse Width: 0.4 ms
MDC IDC LEAD LOCATION: 753860
MDC IDC SET LEADCHNL RV SENSING SENSITIVITY: 0.3 mV

## 2018-07-13 NOTE — Telephone Encounter (Signed)
Called and spoke w/ pt regarding need for labs pt stated that they had just taken their 1st dose last week, therefore; pt no eligible for labs until april

## 2018-07-15 MED ORDER — INSULIN DEGLUDEC 200 UNIT/ML ~~LOC~~ SOPN: 115.0000 [IU] | PEN_INJECTOR | Freq: Every day | SUBCUTANEOUS | 5 refills | Status: DC

## 2018-07-15 NOTE — Addendum Note (Signed)
Addended by: Julious Payer D on: 07/15/2018 02:32 PM   Modules accepted: Orders

## 2018-07-19 ENCOUNTER — Telehealth: Payer: Self-pay | Admitting: *Deleted

## 2018-07-19 MED ORDER — CARVEDILOL 12.5 MG PO TABS: 12.5000 mg | ORAL_TABLET | Freq: Two times a day (BID) | ORAL | 6 refills | Status: DC

## 2018-07-19 NOTE — Telephone Encounter (Signed)
Spoke to pt's wife (DPR on file). Informed of recommendation. Rx sent to pharmacy.

## 2018-07-19 NOTE — Telephone Encounter (Signed)
-----   Message from Will Jorja Loa, MD sent at 07/19/2018 11:29 AM EDT ----- Abnormal device interrogation reviewed.  Lead parameters and battery status stable.  Multiple short episodes of nonsustained VT and SVT.  Increase Coreg to 12.5 mg.

## 2018-07-20 NOTE — Progress Notes (Signed)
Remote ICD transmission.   

## 2018-07-27 ENCOUNTER — Telehealth: Payer: Self-pay | Admitting: Family Medicine

## 2018-07-27 NOTE — Telephone Encounter (Signed)
What is the name of the medication? nitroglycerin  Have you contacted your pharmacy to request a refill? Yes   Which pharmacy would you like this sent to? [facility]   Patient notified that their request is being sent to the clinical staff for review and that they should receive a call once it is complete. If they do not receive a call within 24 hours they can check with their pharmacy or our office.

## 2018-07-28 MED ORDER — NITROGLYCERIN 0.4 MG SL SUBL: 0.4000 mg | SUBLINGUAL_TABLET | SUBLINGUAL | 3 refills | Status: DC | PRN

## 2018-07-28 NOTE — Telephone Encounter (Signed)
Patient notified of refill and advice from Dr. Louanne Skye.  He states he had one episode of chest pain that resolved on its own.  Advised him to call and notify cardiologist of episode.  Patient agreeable.

## 2018-07-28 NOTE — Telephone Encounter (Signed)
I sent in the nitro for the patient, just remind him that if he having to use it for chest pains that he should probably contact his cardiologist but also if the chest pain persist despite using it he should probably going to the emergency department

## 2018-07-29 ENCOUNTER — Other Ambulatory Visit: Payer: Self-pay

## 2018-07-29 ENCOUNTER — Encounter: Payer: Self-pay | Admitting: Family Medicine

## 2018-07-29 ENCOUNTER — Ambulatory Visit (INDEPENDENT_AMBULATORY_CARE_PROVIDER_SITE_OTHER): Payer: BC Managed Care – PPO | Admitting: Family Medicine

## 2018-07-29 VITALS — BP 112/84 | HR 82 | Temp 97.1°F | Ht 70.0 in | Wt 223.0 lb

## 2018-07-29 DIAGNOSIS — I10 Essential (primary) hypertension: Secondary | ICD-10-CM | POA: Diagnosis not present

## 2018-07-29 MED ORDER — EVOLOCUMAB 140 MG/ML ~~LOC~~ SOAJ: 140.0000 mg | SUBCUTANEOUS | 11 refills | Status: DC

## 2018-07-29 NOTE — Progress Notes (Signed)
BP 112/84   Pulse 82   Temp (!) 97.1 F (36.2 C)   Ht 5\' 10"  (1.778 m)   Wt 223 lb (101.2 kg)   BMI 32.00 kg/m    Subjective:   Patient ID: Douglas Carr, male    DOB: 05-29-1965, 53 y.o.   MRN: [id number]  HPI: Douglas Carr is a 53 y.o. male presenting on 07/29/2018 for Follow-up   HPI Hypertension Patient is currently on carvedilol and lisinopril, and their blood pressure today is 112/84. Patient denies any lightheadedness or dizziness. Patient denies headaches, blurred vision, chest pains, shortness of breath, or weakness. Denies any side effects from medication and is content with current medication.   Patient also wants to discuss Cymbalta for mood.  He says he did not doing good on the red Cymbalta and he went back to 60 mg and he feels like he is doing a lot better on the Cymbalta 60 currently.  Relevant past medical, surgical, family and social history reviewed and updated as indicated. Interim medical history since our last visit reviewed. Allergies and medications reviewed and updated.  Review of Systems  Constitutional: Negative for chills and fever.  Eyes: Negative for visual disturbance.  Respiratory: Negative for shortness of breath and wheezing.   Cardiovascular: Negative for chest pain and leg swelling.  Musculoskeletal: Negative for back pain and gait problem.  Skin: Negative for rash.  Neurological: Positive for weakness (No new weakness). Negative for dizziness, light-headedness and headaches.  All other systems reviewed and are negative.   Per HPI unless specifically indicated above   Allergies as of 07/29/2018   No Known Allergies     Medication List       Accurate as of July 29, 2018 11:55 AM. Always use your most recent med list.        acetaminophen 325 MG tablet Commonly known as:  TYLENOL Take 2 tablets (650 mg total) by mouth every 4 (four) hours as needed for headache or mild pain.   aspirin EC 81 MG tablet Take 81 mg  by mouth daily.   atorvastatin 80 MG tablet Commonly known as:  LIPITOR Take 1 tablet (80 mg total) by mouth daily.   carvedilol 12.5 MG tablet Commonly known as:  COREG Take 1 tablet (12.5 mg total) by mouth 2 (two) times daily.   clonazePAM 0.5 MG tablet Commonly known as:  KLONOPIN Take 1 tablet (0.5 mg total) by mouth 2 (two) times daily.   clopidogrel 75 MG tablet Commonly known as:  PLAVIX TAKE 1 TABLET BY MOUTH EVERY DAY IN THE EVENING   DULoxetine 60 MG capsule Commonly known as:  CYMBALTA TAKE 1 CAPSULE BY MOUTH EVERY DAY   Evolocumab 140 MG/ML Soaj Commonly known as:  Repatha SureClick Inject 140 mg into the skin every 14 (fourteen) days.   FreeStyle Libre 14 Day Reader Hardie Pulley 1 each by Does not apply route 4 (four) times daily.   FreeStyle Libre 14 Day Sensor Misc 1 each by Does not apply route every 14 (fourteen) days.   ibuprofen 200 MG tablet Commonly known as:  ADVIL,MOTRIN Take 200 mg by mouth every 6 (six) hours as needed for moderate pain.   Insulin Degludec 200 UNIT/ML Sopn Commonly known as:  [place] FlexTouch Inject 116 Units into the skin daily.   lisinopril 2.5 MG tablet Commonly known as:  PRINIVIL,ZESTRIL Take 1 tablet (2.5 mg total) by mouth daily.   metFORMIN 500 MG tablet Commonly known as:  GLUCOPHAGE  Take 1 tablet (500 mg total) by mouth 2 (two) times daily.   mirtazapine 7.5 MG tablet Commonly known as:  REMERON TAKE 1 TABLET AT BEDTIME FOR DEPRESSION   nitroGLYCERIN 0.4 MG SL tablet Commonly known as:  NITROSTAT Place 1 tablet (0.4 mg total) under the tongue every 5 (five) minutes as needed for chest pain.   OneTouch Verio test strip Generic drug:  glucose blood TEST TWICE DAILY   potassium chloride SA 20 MEQ tablet Commonly known as:  Klor-Con M20 Take 1 tablet (20 mEq total) by mouth daily.   primidone 50 MG tablet Commonly known as:  MYSOLINE TAKE 1 TABLET BY MOUTH EVERYDAY AT BEDTIME   tamsulosin 0.4 MG Caps capsule  Commonly known as:  FLOMAX TAKE 1 CAPSULE DAILY IN THE EVENING   torsemide 20 MG tablet Commonly known as:  DEMADEX Take 1 tablet (20 mg total) by mouth daily. (Needs to be seen        Objective:   BP 112/84   Pulse 82   Temp (!) 97.1 F (36.2 C)   Ht 5\' 10"  (1.778 m)   Wt 223 lb (101.2 kg)   BMI 32.00 kg/m   Wt Readings from Last 3 Encounters:  07/29/18 223 lb (101.2 kg)  07/01/18 216 lb 12.8 oz (98.3 kg)  06/02/18 214 lb 2 oz (97.1 kg)    Physical Exam Vitals signs and nursing note reviewed.  Constitutional:      General: He is not in acute distress.    Appearance: He is well-developed. He is not diaphoretic.  Eyes:     General: No scleral icterus.    Conjunctiva/sclera: Conjunctivae normal.  Neck:     Musculoskeletal: Neck supple.     Thyroid: No thyromegaly.  Cardiovascular:     Rate and Rhythm: Normal rate and regular rhythm.     Heart sounds: Normal heart sounds. No murmur.  Pulmonary:     Effort: Pulmonary effort is normal. No respiratory distress.     Breath sounds: Normal breath sounds. No wheezing.  Musculoskeletal: Normal range of motion.  Lymphadenopathy:     Cervical: No cervical adenopathy.  Skin:    General: Skin is warm and dry.     Findings: No rash.  Neurological:     Mental Status: He is alert and oriented to person, place, and time.     Coordination: Coordination normal.  Psychiatric:        Behavior: Behavior normal.    Assessment & Plan:   Problem List Items Addressed This Visit      Cardiovascular and Mediastinum   Essential hypertension - Primary (Chronic)   Relevant Medications   Evolocumab (REPATHA SURECLICK) 140 MG/ML SOAJ      Patient blood pressure is much better today, will continue with that, he said he did not tolerate the Cymbalta increase and he went back to 60 and he feels like he is doing fine on that. Follow up plan: Return in about 6 months (around 01/29/2019), or if symptoms worsen or fail to improve, for  Hypertension recheck and blood work.  Counseling provided for all of the vaccine components No orders of the defined types were placed in this encounter.   Arville Care, MD [facility] 07/29/2018, 11:55 AM

## 2018-08-05 ENCOUNTER — Other Ambulatory Visit: Payer: Self-pay | Admitting: *Deleted

## 2018-08-05 MED ORDER — INSULIN PEN NEEDLE 31G X 8 MM MISC: 3 refills | Status: DC

## 2018-08-06 ENCOUNTER — Other Ambulatory Visit: Payer: Self-pay | Admitting: *Deleted

## 2018-08-06 MED ORDER — INSULIN PEN NEEDLE 31G X 8 MM MISC: 3 refills | Status: DC

## 2018-08-23 ENCOUNTER — Telehealth: Payer: Self-pay

## 2018-08-23 NOTE — Telephone Encounter (Signed)
[facility] - Voiemail not set up

## 2018-08-31 ENCOUNTER — Telehealth: Payer: Self-pay | Admitting: Cardiovascular Disease

## 2018-08-31 NOTE — Telephone Encounter (Signed)
New Message    Pts wife is calling because the pt has forms that need to be filled out so he can work and is wondering if he needs to be seen or if they can bring the forms to the office     Please call

## 2018-08-31 NOTE — Telephone Encounter (Signed)
Pt's wife aware okay to drop off forms at office to be filled out .Zack Seal

## 2018-09-08 ENCOUNTER — Telehealth: Payer: Self-pay

## 2018-09-08 NOTE — Telephone Encounter (Signed)
Spoke with pt wife as she has additional paper work that needs to go with this, told her to ask for me when she comes in today. Made her aware I have previous paper signed and ready for pickup. Verbalized understanding.

## 2018-09-10 ENCOUNTER — Other Ambulatory Visit: Payer: Self-pay

## 2018-09-10 ENCOUNTER — Other Ambulatory Visit: Payer: Self-pay | Admitting: Family Medicine

## 2018-09-10 ENCOUNTER — Telehealth: Payer: Self-pay | Admitting: Neurology

## 2018-09-10 DIAGNOSIS — F32A Depression, unspecified: Secondary | ICD-10-CM

## 2018-09-10 DIAGNOSIS — F419 Anxiety disorder, unspecified: Secondary | ICD-10-CM

## 2018-09-10 DIAGNOSIS — F329 Major depressive disorder, single episode, unspecified: Secondary | ICD-10-CM

## 2018-09-10 NOTE — Telephone Encounter (Signed)
Notified pt/ wife--handicap placard form is ready to pick up at the front office. Let the pt and  wife know to give [place] a call before arriving to the office.

## 2018-09-13 ENCOUNTER — Encounter: Payer: Self-pay | Admitting: Family Medicine

## 2018-09-13 ENCOUNTER — Ambulatory Visit (INDEPENDENT_AMBULATORY_CARE_PROVIDER_SITE_OTHER): Payer: BC Managed Care – PPO | Admitting: Family Medicine

## 2018-09-13 ENCOUNTER — Other Ambulatory Visit: Payer: Self-pay

## 2018-09-13 VITALS — BP 138/78 | HR 94 | Temp 96.9°F | Ht 70.0 in | Wt 231.4 lb

## 2018-09-13 DIAGNOSIS — E118 Type 2 diabetes mellitus with unspecified complications: Secondary | ICD-10-CM

## 2018-09-13 DIAGNOSIS — F329 Major depressive disorder, single episode, unspecified: Secondary | ICD-10-CM

## 2018-09-13 DIAGNOSIS — G253 Myoclonus: Secondary | ICD-10-CM

## 2018-09-13 DIAGNOSIS — F32A Depression, unspecified: Secondary | ICD-10-CM

## 2018-09-13 DIAGNOSIS — F419 Anxiety disorder, unspecified: Secondary | ICD-10-CM

## 2018-09-13 LAB — BAYER DCA HB A1C WAIVED: HB A1C (BAYER DCA - WAIVED): 8.4 % — ABNORMAL HIGH (ref <7.0)

## 2018-09-13 MED ORDER — CLONAZEPAM 0.5 MG PO TABS: 0.5000 mg | ORAL_TABLET | Freq: Two times a day (BID) | ORAL | 2 refills | Status: DC

## 2018-09-13 NOTE — Progress Notes (Signed)
BP 138/78   Pulse 94   Temp (!) 96.9 F (36.1 C) (Oral)   Ht 5\' 10"  (1.778 m)   Wt 231 lb 6.4 oz (105 kg)   BMI 33.20 kg/m    Subjective:   Patient ID: Douglas Carr, male    DOB: 1966/02/19, 53 y.o.   MRN: [id number]  HPI: Douglas Carr is a 53 y.o. male presenting on 09/13/2018 for [facility] paper work   HPI Patient is coming in today for [facility] paperwork needs assessment for anxiety depression and myoclonus and diabetes.  He says his diabetes is been doing better and his blood sugar today was 78.  He is currently taking [place] and metformin.  Patient also comes in discussing anxiety and depression for his DMV and says that everything is under control and he is currently taking duloxetine and it is helping him.  He denies any suicidal ideations or thoughts of hurting self.  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  Patient had anoxic brain injury in 2018 which left him with some vocal dysfunction but no other major dysfunction.  Relevant past medical, surgical, family and social history reviewed and updated as indicated. Interim medical history since our last visit reviewed. Allergies and medications reviewed and updated.  Review of Systems  Constitutional: Negative for chills and fever.  Eyes: Negative for visual disturbance.  Respiratory: Negative for shortness of breath and wheezing.   Cardiovascular: Negative for chest pain and leg swelling.  Musculoskeletal: Negative for back pain and gait problem.  Skin: Negative for rash.  Neurological: Positive for tremors. Negative for dizziness, seizures, weakness, light-headedness and numbness.  Psychiatric/Behavioral: Negative for dysphoric mood, self-injury, sleep disturbance and suicidal ideas. The patient is not nervous/anxious.   All other systems reviewed and are negative.   Per HPI unless specifically indicated  above   Allergies as of 09/13/2018   No Known Allergies     Medication List       Accurate as of Sep 13, 2018  1:46 PM. If you have any questions, ask your nurse or doctor.        acetaminophen 325 MG tablet Commonly known as:  TYLENOL Take 2 tablets (650 mg total) by mouth every 4 (four) hours as needed for headache or mild pain.   aspirin EC 81 MG tablet Take 81 mg by mouth daily.   atorvastatin 80 MG tablet Commonly known as:  LIPITOR Take 1 tablet (80 mg total) by mouth daily.   carvedilol 12.5 MG tablet Commonly known as:  COREG Take 1 tablet (12.5 mg total) by mouth 2 (two) times daily.   clonazePAM 0.5 MG tablet Commonly known as:  KLONOPIN Take 1 tablet (0.5 mg total) by mouth 2 (two) times daily.   clopidogrel 75 MG tablet Commonly known as:  PLAVIX TAKE 1 TABLET BY MOUTH EVERY DAY IN THE EVENING   DULoxetine 60 MG capsule Commonly known as:  CYMBALTA TAKE 1 CAPSULE BY MOUTH EVERY DAY   Evolocumab 140 MG/ML Soaj Commonly known as:  Repatha SureClick Inject 140 mg into the skin every 14 (fourteen) days.   FreeStyle Libre 14 Day Reader Hardie Pulley 1 each by Does not apply route 4 (four) times daily.   FreeStyle Libre 14 Day Sensor Misc 1 each by Does not apply route every 14 (fourteen) days.   ibuprofen 200 MG tablet Commonly known as:  ADVIL  Take 200 mg by mouth every 6 (six) hours as needed for moderate pain.   Insulin Degludec 200 UNIT/ML Sopn Commonly known as:  [place] FlexTouch Inject 116 Units into the skin daily.   Insulin Pen Needle 31G X 8 MM Misc Commonly known as:  B-D ULTRAFINE III SHORT PEN USE TO INJECT INSULIN DAILY DX E11.9   lisinopril 2.5 MG tablet Commonly known as:  ZESTRIL Take 1 tablet (2.5 mg total) by mouth daily.   metFORMIN 500 MG tablet Commonly known as:  GLUCOPHAGE Take 1 tablet (500 mg total) by mouth 2 (two) times daily.   mirtazapine 7.5 MG tablet Commonly known as:  REMERON TAKE 1 TABLET AT BEDTIME FOR  DEPRESSION   nitroGLYCERIN 0.4 MG SL tablet Commonly known as:  NITROSTAT Place 1 tablet (0.4 mg total) under the tongue every 5 (five) minutes as needed for chest pain.   OneTouch Verio test strip Generic drug:  glucose blood TEST TWICE DAILY   potassium chloride SA 20 MEQ tablet Commonly known as:  Klor-Con M20 Take 1 tablet (20 mEq total) by mouth daily.   primidone 50 MG tablet Commonly known as:  MYSOLINE TAKE 1 TABLET BY MOUTH EVERYDAY AT BEDTIME   tamsulosin 0.4 MG Caps capsule Commonly known as:  FLOMAX TAKE 1 CAPSULE DAILY IN THE EVENING   torsemide 20 MG tablet Commonly known as:  DEMADEX Take 1 tablet (20 mg total) by mouth daily. (Needs to be seen        Objective:   BP 138/78   Pulse 94   Temp (!) 96.9 F (36.1 C) (Oral)   Ht 5\' 10"  (1.778 m)   Wt 231 lb 6.4 oz (105 kg)   BMI 33.20 kg/m   Wt Readings from Last 3 Encounters:  09/13/18 231 lb 6.4 oz (105 kg)  07/29/18 223 lb (101.2 kg)  07/01/18 216 lb 12.8 oz (98.3 kg)    Physical Exam Vitals signs and nursing note reviewed.  Constitutional:      General: He is not in acute distress.    Appearance: He is well-developed. He is not diaphoretic.  Eyes:     General: No scleral icterus.       Right eye: No discharge.     Conjunctiva/sclera: Conjunctivae normal.     Pupils: Pupils are equal, round, and reactive to light.  Neck:     Musculoskeletal: Neck supple.     Thyroid: No thyromegaly.  Cardiovascular:     Rate and Rhythm: Normal rate and regular rhythm.     Heart sounds: Normal heart sounds. No murmur.  Pulmonary:     Effort: Pulmonary effort is normal. No respiratory distress.     Breath sounds: Normal breath sounds. No wheezing.  Musculoskeletal: Normal range of motion.  Lymphadenopathy:     Cervical: No cervical adenopathy.  Skin:    General: Skin is warm and dry.     Findings: No rash.  Neurological:     Mental Status: He is alert and oriented to person, place, and time.      Cranial Nerves: No cranial nerve deficit.     Sensory: No sensory deficit.     Motor: No weakness.     Coordination: Coordination normal.     Gait: Gait normal.     Comments: Floats speech but still understandable which patient has had since his anoxic brain injury  Psychiatric:        Behavior: Behavior normal.       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  Counseling provided for all of the vaccine components Orders Placed This Encounter  Procedures  . Bayer [facility] Hb A1c Waived    Arville Care, MD [facility] 09/13/2018, 1:46 PM

## 2018-09-13 NOTE — Telephone Encounter (Signed)
Last seen 07/29/18 last filled 07/01/18

## 2018-10-04 ENCOUNTER — Other Ambulatory Visit: Payer: Self-pay

## 2018-10-04 DIAGNOSIS — I6522 Occlusion and stenosis of left carotid artery: Secondary | ICD-10-CM

## 2018-10-04 DIAGNOSIS — I6521 Occlusion and stenosis of right carotid artery: Secondary | ICD-10-CM

## 2018-10-08 ENCOUNTER — Telehealth (HOSPITAL_COMMUNITY): Payer: Self-pay | Admitting: *Deleted

## 2018-10-08 NOTE — Telephone Encounter (Signed)
The above patient or their representative was contacted and gave the following answers to these questions:         Do you have any of the following symptoms?  no  Fever                    Cough                   Shortness of breath  Do  you have any of the following other symptoms? no   muscle pain         vomiting,        diarrhea        rash         weakness        red eye        abdominal pain         bruising          bruising or bleeding              joint pain           severe headache    Have you been in contact with someone who was or has been sick in the past 2 weeks?  no  Yes                 Unsure                         Unable to assess   Does the person that you were in contact with have any of the following symptoms?   Cough         shortness of breath           muscle pain         vomiting,            diarrhea            rash            weakness           fever            red eye           abdominal pain           bruising  or  bleeding                joint pain                severe headache               Have you  or someone you have been in contact with traveled internationally in th last month?         If yes, which countries? no   Have you  or someone you have been in contact with traveled outside  in th last month?         If yes, which state and city?  no   COMMENTS OR ACTION PLAN FOR THIS PATIENT:          

## 2018-10-11 ENCOUNTER — Ambulatory Visit (INDEPENDENT_AMBULATORY_CARE_PROVIDER_SITE_OTHER): Payer: BC Managed Care – PPO | Admitting: *Deleted

## 2018-10-11 ENCOUNTER — Ambulatory Visit: Payer: BC Managed Care – PPO | Admitting: Family

## 2018-10-11 ENCOUNTER — Ambulatory Visit (HOSPITAL_COMMUNITY): Payer: BC Managed Care – PPO

## 2018-10-11 DIAGNOSIS — I255 Ischemic cardiomyopathy: Secondary | ICD-10-CM | POA: Diagnosis not present

## 2018-10-11 DIAGNOSIS — I5022 Chronic systolic (congestive) heart failure: Secondary | ICD-10-CM

## 2018-10-11 LAB — CUP PACEART REMOTE DEVICE CHECK
Battery Remaining Longevity: 123 mo
Battery Voltage: 3.03 V
Brady Statistic RV Percent Paced: 0 %
Date Time Interrogation Session: 20200608062606
HighPow Impedance: 71 Ohm
Implantable Lead Implant Date: 20180226
Implantable Lead Location: 753860
Implantable Pulse Generator Implant Date: 20180226
Lead Channel Impedance Value: 304 Ohm
Lead Channel Impedance Value: 399 Ohm
Lead Channel Pacing Threshold Amplitude: 1 V
Lead Channel Pacing Threshold Pulse Width: 0.4 ms
Lead Channel Sensing Intrinsic Amplitude: 15 mV
Lead Channel Sensing Intrinsic Amplitude: 15 mV
Lead Channel Setting Pacing Amplitude: 2.5 V
Lead Channel Setting Pacing Pulse Width: 0.4 ms
Lead Channel Setting Sensing Sensitivity: 0.3 mV

## 2018-10-16 ENCOUNTER — Other Ambulatory Visit: Payer: Self-pay | Admitting: Family Medicine

## 2018-10-19 NOTE — Progress Notes (Signed)
Remote ICD transmission.   

## 2018-11-03 ENCOUNTER — Telehealth: Payer: Self-pay | Admitting: *Deleted

## 2018-11-03 MED ORDER — CARVEDILOL 25 MG PO TABS: 25.0000 mg | ORAL_TABLET | Freq: Two times a day (BID) | ORAL | 1 refills | Status: DC

## 2018-11-03 NOTE — Telephone Encounter (Signed)
Notes recorded by Stanton Kidney, RN on 11/03/2018 at 12:56 PM EDT  Spoke to pt and his dtr. Informed patient of results and verbal understanding expressed. Updates Rx sent to pharmacy. Yearly OV scheduled for later this month. Dtr and pt verbalized understanding and agreeable to plan.

## 2018-11-03 NOTE — Telephone Encounter (Signed)
-----   Message from Will Meredith Leeds, MD sent at 10/12/2018 11:24 AM EDT ----- Abnormal device interrogation reviewed.  Lead parameters and battery status stable.  SVT and NSVT seen. Increase coreg to 25 mg.

## 2018-11-25 ENCOUNTER — Other Ambulatory Visit: Payer: Self-pay | Admitting: Family Medicine

## 2018-11-29 ENCOUNTER — Telehealth: Payer: Self-pay | Admitting: Cardiology

## 2018-11-29 NOTE — Telephone Encounter (Signed)
New Message    Unable to leave message due to no voicemail  Called to confirm appt and answer covid questions

## 2018-11-30 ENCOUNTER — Encounter: Payer: BC Managed Care – PPO | Admitting: Cardiology

## 2018-11-30 NOTE — Progress Notes (Deleted)
Electrophysiology Office Note   Date:  11/30/2018   ID:  Douglas Carr, DOB [birth year], MRN [id number]  PCP:  [name], Elige RadonJoshua A, MD  Cardiologist:  Allyson SabalBerry Primary Electrophysiologist:  Regan LemmingWill Martin Agata Lucente, MD    No chief complaint on file.    History of Present Illness: Douglas Carr is a 53 y.o. male who presents today for electrophysiology evaluation.   Admitted in early January after a STEMI with PCI to the OM 2 on 05/11/16. He then re-presented to the hospital with cardiac arrest and ventricular fibrillation when he was found pulseless by his family. CPR was initiated within 1 minute. He had an AED shock in the field. He was taken back to the cath lab and was found to have an edge dissection with thrombus in the circumflex treated with overlapping drug-eluting stents. He was placed on the cooling protocol. It was thought at the time that his arrest was caused by VT VF and not a myocardial infarction. Due to that, it was thought that a defibrillator would be indicated. His hospital course was complicated by anoxic encephalopathy. It was thought that he had anoxic brain injury from a cardiac arrest causing a left thalamic stroke.   Today, denies symptoms of palpitations, chest pain, shortness of breath, orthopnea, PND, lower extremity edema, claudication, dizziness, presyncope, syncope, bleeding, or neurologic sequela. The patient is tolerating medications without difficulties and is otherwise without complaint today. He had a [employer] ICD implanted for secondary prevention on 06/30/16. He is been able to work with cardiac rehabilitation, working at 15-20 minutes time. He still has some memory issues but that is significantly improved.   Past Medical History:  Diagnosis Date  . Acute ST elevation myocardial infarction (STEMI) involving left circumflex coronary artery (HCC) 05/07/2016   PCI to Cx-OM  . Anginal pain (HCC)    secondary to sm. vessel disease  . Anxiety   .  Arthritis    BIL KNEE PAIN AND BIL ANKLE PAIN  . Bone spur of ankle   . Cardiac arrest (HCC) 05/24/2016   with v fib  . 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 Coronary artery disease involving native coronary artery of native heart with angina pectoris (HCC) 05/24/2016   Remote MI at 53 years of age, Last cath 2010-diffuse non-obstructive disease; last echo 06/16/08 -normal LV function, moderate concentric hypertrophy; nuc 08/2008 no ischemia;  medical therapy; STEMI May 07 2016 - PCI to Cx-OM  . Diabetes mellitus    ON ORAL MEDICATION AND INSULIN  . Dilated cardiomyopathy (HCC) 05/24/2016   EF 325-30% by Echo post STEMI (previously 30-35%)   . Hyperlipidemia   . Hypertension   . Myocardial infarction (HCC) 1996   Post MI  . Peripheral vascular disease (HCC)    HAS LEFT CAROTID ARTERY STENOSIS   AND IS S/P RIGHT CAROTID ENDARTERECTOMY 2010 Last carotid dopplers 01/08/2012 wth patent endarterectomy site  . Status post coronary artery stent placement    Past Surgical History:  Procedure Laterality Date  . APPENDECTOMY    . CARDIAC CATHETERIZATION     FEB 2010, significant branch vessel disease wth diag, marginal, PDA & PLA, nml. LV function  . CARDIAC CATHETERIZATION N/A 05/07/2016   Procedure: Left Heart Cath and Coronary Angiography;  Surgeon: Peter M SwazilandJordan, MD;  [facility];  Service: Cardiovascular;  Laterality: N/A;  . CARDIAC CATHETERIZATION N/A 05/07/2016   Procedure: Coronary Stent Intervention;  Surgeon: Peter M SwazilandJordan, MD;  [facility];  Service: Cardiovascular;  Laterality: N/A;  . CARDIAC CATHETERIZATION N/A 05/24/2016   Procedure: Left Heart Cath and Coronary Angiography;  Surgeon: Marykay Lexavid W Harding, MD;  [facility];  Service: Cardiovascular;  Laterality: N/A;  . CARDIAC CATHETERIZATION N/A 05/24/2016   Procedure: Coronary Stent Intervention;  Surgeon: Marykay Lexavid W Harding, MD;   [facility];  Service: Cardiovascular;  Laterality: N/A;  2.5x20 Promus to Ostial/proximal circumflex  . CAROTID ENDARTERECTOMY  09/2008   Rt CEA  . ICD IMPLANT N/A 06/30/2016   Procedure: ICD Implant;  Surgeon: Jaron Czarnecki Jorja LoaMartin Carie Kapuscinski, MD;  Location: [facility] INVASIVE CV LAB;  Service: Cardiovascular;  Laterality: N/A;  . LUNG BIOPSY  2013   Bx's suggest granulomatous dz.  . RT ANKLE   2013     Current Outpatient Medications  Medication Sig Dispense Refill  . acetaminophen (TYLENOL) 325 MG tablet Take 2 tablets (650 mg total) by mouth every 4 (four) hours as needed for headache or mild pain.    Marland Kitchen. aspirin EC 81 MG tablet Take 81 mg by mouth daily.    Marland Kitchen. atorvastatin (LIPITOR) 80 MG tablet Take 1 tablet (80 mg total) by mouth daily. 90 tablet 3  . carvedilol (COREG) 25 MG tablet Take 1 tablet (25 mg total) by mouth 2 (two) times daily. 180 tablet 1  . clonazePAM (KLONOPIN) 0.5 MG tablet Take 1 tablet (0.5 mg total) by mouth 2 (two) times daily. 60 tablet 2  . clopidogrel (PLAVIX) 75 MG tablet TAKE 1 TABLET BY MOUTH EVERY DAY IN THE EVENING 90 tablet 2  . Continuous Blood Gluc Receiver (FREESTYLE LIBRE 14 DAY READER) DEVI 1 each by Does not apply route 4 (four) times daily. 1 Device 1  . Continuous Blood Gluc Sensor (FREESTYLE LIBRE 14 DAY SENSOR) MISC 1 each by Does not apply route every 14 (fourteen) days. 7 each 3  . DULoxetine (CYMBALTA) 60 MG capsule TAKE 1 CAPSULE BY MOUTH EVERY DAY 90 capsule 1  . Evolocumab (REPATHA SURECLICK) 140 MG/ML SOAJ Inject 140 mg into the skin every 14 (fourteen) days. 2 pen 11  . ibuprofen (ADVIL,MOTRIN) 200 MG tablet Take 200 mg by mouth every 6 (six) hours as needed for moderate pain.    . Insulin Degludec (TRESIBA FLEXTOUCH) 200 UNIT/ML SOPN Inject 116 Units into the skin daily. 26 mL 5  . Insulin Pen Needle (B-D ULTRAFINE III SHORT PEN) 31G X 8 MM MISC USE TO INJECT INSULIN DAILY DX E11.9 100 each 3  . lisinopril (PRINIVIL,ZESTRIL) 2.5 MG tablet Take  1 tablet (2.5 mg total) by mouth daily. 90 tablet 3  . metFORMIN (GLUCOPHAGE) 500 MG tablet TAKE 1 TABLET BY MOUTH TWICE A DAY 180 tablet 0  . mirtazapine (REMERON) 7.5 MG tablet TAKE 1 TABLET AT BEDTIME FOR DEPRESSION 90 tablet 1  . nitroGLYCERIN (NITROSTAT) 0.4 MG SL tablet PLACE 1 TABLET (0.4 MG TOTAL) UNDER THE TONGUE EVERY 5 (FIVE) MINUTES AS NEEDED FOR CHEST PAIN. 150 tablet 1  . ONETOUCH VERIO test strip TEST TWICE DAILY 100 each 11  . potassium chloride SA (KLOR-CON M20) 20 MEQ tablet Take 1 tablet (20 mEq total) by mouth daily. 90 tablet 3  . primidone (MYSOLINE) 50 MG tablet TAKE 1 TABLET BY MOUTH EVERYDAY AT BEDTIME 90 tablet 1  . tamsulosin (FLOMAX) 0.4 MG CAPS capsule TAKE 1 CAPSULE DAILY IN THE EVENING 90 capsule 3  . torsemide (DEMADEX) 20 MG  tablet Take 1 tablet (20 mg total) by mouth daily. (Needs to be seen 90 tablet 3   No current facility-administered medications for this visit.     Allergies:   Patient has no known allergies.   Social History:  The patient  reports that he quit smoking about 7 years ago. His smoking use included cigarettes. He has a 44.00 pack-year smoking history. His smokeless tobacco use includes snuff. He reports that he does not drink alcohol or use drugs.   Family History:  The patient's family history includes Diabetes in his mother; Heart attack in his paternal grandfather; Heart attack (age of onset: 6969) in his father; Hypertension in his maternal grandfather and mother.    ROS:  Please see the history of present illness.   Otherwise, review of systems is positive for none.   All other systems are reviewed and negative.     PHYSICAL EXAM: VS:  There were no vitals taken for this visit. , BMI There is no height or weight on file to calculate BMI. GEN: Well nourished, well developed, in no acute distress  HEENT: normal  Neck: no JVD, carotid bruits, or masses Cardiac: RRR; no murmurs, rubs, or gallops,no edema  Respiratory:  clear to  auscultation bilaterally, normal work of breathing GI: soft, nontender, nondistended, + BS MS: no deformity or atrophy  Skin: warm and dry, device site well healed Neuro:  Strength and sensation are intact Psych: euthymic mood, full affect  EKG:  EKG is ordered today. Personal review of the ekg ordered  shows sinus rhythm, rate 90, left anterior fascicular block, lateral T wave inversions  Personal review of the device interrogation today. Results in [facility]  Recent Labs: 01/14/2018: Hemoglobin 14.9; Platelets 197 07/01/2018: ALT 15; BUN 19; Creatinine, Ser 0.90; Potassium 4.8; Sodium 141    Lipid Panel     Component Value Date/Time   CHOL 274 (H) 01/14/2018 1529   TRIG 375 (H) 01/14/2018 1529   HDL 29 (L) 01/14/2018 1529   CHOLHDL 9.4 (H) 01/14/2018 1529   CHOLHDL 4.7 05/24/2016 1537   VLDL 28 05/24/2016 1537   LDLCALC 170 (H) 01/14/2018 1529     Wt Readings from Last 3 Encounters:  09/13/18 231 lb 6.4 oz (105 kg)  07/29/18 223 lb (101.2 kg)  07/01/18 216 lb 12.8 oz (98.3 kg)      Other studies Reviewed: Additional studies/ records that were reviewed today include: Cath 05/24/16, TTE 05/15/16  Review of the above records today demonstrates:   Ost Cx to Mid Cx lesion, 70 %stenosed leading into OM2 as the main trunk of the Circumflex.  Ost 2nd Mrg to 2nd Mrg recent Promus DES 2.5 x 24 stent, - focal 90 %stenosed with what appears to be proximal edge dissection with thrombus  A STENT PROMUS PREM MR 2.5X20 drug eluting stent was successfully placed from Ostium of Circumflex into OM2, and overlaps previously placed stent.  Post intervention, there is a 0% residual stenosis.  ____________________________________________________  Suezanne Jacquetst LAD to Prox LAD lesion, 30 %stenosed.  Ost 1st Diag to 1st Diag lesion, 70 %stenosed.  Ost 1st Mrg to 1st Mrg lesion, 35 %stenosed. Ost 2nd Diag to 2nd Diag lesion, 50 %stenosed.  Prox RCA to Mid RCA lesion, 65 %stenosed - lesion appears  similar to prior cath, with mild progression.  Very distal RPDA lesion, 100 %stenosed.  LV end diastolic pressure is severely elevated.  There is no aortic valve stenosis.    - Left ventricle: The cavity  size was moderately dilated. Wall   thickness was normal. Systolic function was severely reduced. The   estimated ejection fraction was in the range of 25% to 30%.   Akinesis and scarring of the anteroseptal and anterior   myocardium; consistent with infarction in the distribution of the   left anterior descending coronary artery. Dyskinesis of the   apicalanterior myocardium. Features are consistent with a   pseudonormal left ventricular filling pattern, with concomitant   abnormal relaxation and increased filling pressure (grade 2   diastolic dysfunction). No evidence of thrombus. - Left atrium: The atrium was mildly dilated. - Atrial septum: No defect or patent foramen ovale was identified.  ASSESSMENT AND PLAN:  1.  Ischemic cardiomyopathy: Currently on optimal medical therapy with Coreg, lisinopril, aspirin, Plavix.  ***  2. Essential hypertension: ***  3. 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 Hyperlipidemia: Continue Lipitor   Current medicines are reviewed at length with the patient today.   The patient does not have concerns regarding his medicines.  The following changes were made today:  ***  Labs/ tests ordered today include:  No orders of the defined types were placed in this encounter.    Disposition:   FU with Merrit Friesen *** months  Signed, Bricyn Labrada Meredith Leeds, MD  11/30/2018 8:28 AM     [facility] [address] [place] [address] [phone] (office) [phone] (fax)

## 2018-12-14 ENCOUNTER — Telehealth: Payer: Self-pay | Admitting: Family Medicine

## 2018-12-15 ENCOUNTER — Ambulatory Visit: Payer: BC Managed Care – PPO | Admitting: Family Medicine

## 2018-12-29 ENCOUNTER — Other Ambulatory Visit: Payer: Self-pay | Admitting: Family Medicine

## 2018-12-29 DIAGNOSIS — G253 Myoclonus: Secondary | ICD-10-CM

## 2018-12-29 NOTE — Telephone Encounter (Signed)
TC from wife Pt has been out for 2 days, has the shakes really bad, not able to get appt until 02/16/19

## 2018-12-29 NOTE — Telephone Encounter (Signed)
televisit set for 12/31/18

## 2018-12-29 NOTE — Telephone Encounter (Signed)
I have a virtual appointment on 12/31/2018 in the afternoon, he can be placed there, please in the future plan ahead as visits are going to get harder to be in and this needs to be refilled during visits.

## 2018-12-30 ENCOUNTER — Other Ambulatory Visit: Payer: Self-pay | Admitting: Family Medicine

## 2018-12-30 DIAGNOSIS — F32A Depression, unspecified: Secondary | ICD-10-CM

## 2018-12-30 DIAGNOSIS — F329 Major depressive disorder, single episode, unspecified: Secondary | ICD-10-CM

## 2018-12-30 DIAGNOSIS — F419 Anxiety disorder, unspecified: Secondary | ICD-10-CM

## 2018-12-31 ENCOUNTER — Ambulatory Visit (INDEPENDENT_AMBULATORY_CARE_PROVIDER_SITE_OTHER): Payer: BC Managed Care – PPO | Admitting: Family Medicine

## 2018-12-31 ENCOUNTER — Encounter: Payer: Self-pay | Admitting: Family Medicine

## 2018-12-31 DIAGNOSIS — F419 Anxiety disorder, unspecified: Secondary | ICD-10-CM

## 2018-12-31 DIAGNOSIS — E669 Obesity, unspecified: Secondary | ICD-10-CM

## 2018-12-31 DIAGNOSIS — E118 Type 2 diabetes mellitus with unspecified complications: Secondary | ICD-10-CM

## 2018-12-31 DIAGNOSIS — E782 Mixed hyperlipidemia: Secondary | ICD-10-CM | POA: Diagnosis not present

## 2018-12-31 DIAGNOSIS — G253 Myoclonus: Secondary | ICD-10-CM

## 2018-12-31 DIAGNOSIS — E1169 Type 2 diabetes mellitus with other specified complication: Secondary | ICD-10-CM | POA: Diagnosis not present

## 2018-12-31 DIAGNOSIS — I10 Essential (primary) hypertension: Secondary | ICD-10-CM | POA: Diagnosis not present

## 2018-12-31 DIAGNOSIS — F32A Depression, unspecified: Secondary | ICD-10-CM

## 2018-12-31 DIAGNOSIS — F329 Major depressive disorder, single episode, unspecified: Secondary | ICD-10-CM

## 2018-12-31 DIAGNOSIS — E119 Type 2 diabetes mellitus without complications: Secondary | ICD-10-CM

## 2018-12-31 MED ORDER — CLOPIDOGREL BISULFATE 75 MG PO TABS: ORAL_TABLET | ORAL | 3 refills | Status: DC

## 2018-12-31 MED ORDER — REPATHA SURECLICK 140 MG/ML ~~LOC~~ SOAJ: 140.0000 mg | SUBCUTANEOUS | 11 refills | Status: DC

## 2018-12-31 MED ORDER — ATORVASTATIN CALCIUM 80 MG PO TABS: 80.0000 mg | ORAL_TABLET | Freq: Every day | ORAL | 3 refills | Status: DC

## 2018-12-31 MED ORDER — METFORMIN HCL 500 MG PO TABS: 500.0000 mg | ORAL_TABLET | Freq: Two times a day (BID) | ORAL | 3 refills | Status: DC

## 2018-12-31 MED ORDER — TRESIBA FLEXTOUCH 200 UNIT/ML ~~LOC~~ SOPN: 115.0000 [IU] | PEN_INJECTOR | Freq: Every day | SUBCUTANEOUS | 5 refills | Status: DC

## 2018-12-31 MED ORDER — CLONAZEPAM 0.5 MG PO TABS: 0.5000 mg | ORAL_TABLET | Freq: Two times a day (BID) | ORAL | 2 refills | Status: DC

## 2018-12-31 MED ORDER — DULOXETINE HCL 60 MG PO CPEP: 60.0000 mg | ORAL_CAPSULE | Freq: Every day | ORAL | 3 refills | Status: DC

## 2018-12-31 MED ORDER — MIRTAZAPINE 7.5 MG PO TABS: ORAL_TABLET | ORAL | 3 refills | Status: DC

## 2018-12-31 MED ORDER — LISINOPRIL 2.5 MG PO TABS: 2.5000 mg | ORAL_TABLET | Freq: Every day | ORAL | 3 refills | Status: DC

## 2018-12-31 MED ORDER — TAMSULOSIN HCL 0.4 MG PO CAPS: ORAL_CAPSULE | ORAL | 3 refills | Status: DC

## 2018-12-31 NOTE — Progress Notes (Signed)
Virtual Visit via telephone Note  I connected with Douglas Carr on 12/31/18 at 1500 by telephone and verified that I am speaking with the correct person using two identifiers. Douglas SermonsClifford M Ord is currently located at home and wife  are currently with her during visit. The provider, Elige RadonJoshua A Avedis Bevis, MD is located in their office at time of visit.  Call ended at 1530  I discussed the limitations, risks, security and privacy concerns of performing an evaluation and management service by telephone and the availability of in person appointments. I also discussed with the patient that there may be a patient responsible charge related to this service. The patient expressed understanding and agreed to proceed.   History and Present Illness: Type 2 diabetes mellitus Patient comes in today for recheck of his diabetes. Patient has been currently taking metformin and tresiba and patient has been out of insulin for a short while because of insurance lapse. Patient is currently on an ACE inhibitor/ARB. Patient has not seen an ophthalmologist this year. Patient denies any issues with their feet.   Hyperlipidemia Patient is coming in for recheck of his hyperlipidemia. The patient is currently taking repatha and atorvastatin. They deny any issues with myalgias or history of liver damage from it. They deny any focal numbness or weakness or chest pain.   Hypertension Patient is currently on carvedilol and lisinopril, and their blood pressure today is 138/78 [facility] month ago. Patient denies any lightheadedness or dizziness. Patient denies headaches, blurred vision, chest pains, shortness of breath, or weakness. Denies any side effects from medication and is content with current medication.   Depression and anxiety Patient took cymbalta and mirtazipine and   No diagnosis found.  Outpatient Encounter Medications as of 12/31/2018  Medication Sig  . acetaminophen (TYLENOL) 325 MG tablet Take 2  tablets (650 mg total) by mouth every 4 (four) hours as needed for headache or mild pain.  Marland Kitchen. aspirin EC 81 MG tablet Take 81 mg by mouth daily.  Marland Kitchen. atorvastatin (LIPITOR) 80 MG tablet Take 1 tablet (80 mg total) by mouth daily.  . carvedilol (COREG) 25 MG tablet Take 1 tablet (25 mg total) by mouth 2 (two) times daily.  . clonazePAM (KLONOPIN) 0.5 MG tablet Take 1 tablet (0.5 mg total) by mouth 2 (two) times daily.  . clopidogrel (PLAVIX) 75 MG tablet TAKE 1 TABLET BY MOUTH EVERY DAY IN THE EVENING  . Continuous Blood Gluc Receiver (FREESTYLE LIBRE 14 DAY READER) DEVI 1 each by Does not apply route 4 (four) times daily.  . Continuous Blood Gluc Sensor (FREESTYLE LIBRE 14 DAY SENSOR) MISC 1 each by Does not apply route every 14 (fourteen) days.  . DULoxetine (CYMBALTA) 60 MG capsule TAKE 1 CAPSULE BY MOUTH EVERY DAY  . Evolocumab (REPATHA SURECLICK) 140 MG/ML SOAJ Inject 140 mg into the skin every 14 (fourteen) days.  Marland Kitchen. ibuprofen (ADVIL,MOTRIN) 200 MG tablet Take 200 mg by mouth every 6 (six) hours as needed for moderate pain.  . Insulin Degludec (TRESIBA FLEXTOUCH) 200 UNIT/ML SOPN Inject 116 Units into the skin daily.  . Insulin Pen Needle (B-D ULTRAFINE III SHORT PEN) 31G X 8 MM MISC USE TO INJECT INSULIN DAILY DX E11.9  . lisinopril (PRINIVIL,ZESTRIL) 2.5 MG tablet Take 1 tablet (2.5 mg total) by mouth daily.  . metFORMIN (GLUCOPHAGE) 500 MG tablet TAKE 1 TABLET BY MOUTH TWICE A DAY  . mirtazapine (REMERON) 7.5 MG tablet TAKE 1 TABLET AT BEDTIME FOR DEPRESSION  .  nitroGLYCERIN (NITROSTAT) 0.4 MG SL tablet PLACE 1 TABLET (0.4 MG TOTAL) UNDER THE TONGUE EVERY 5 (FIVE) MINUTES AS NEEDED FOR CHEST PAIN.  Marland Kitchen ONETOUCH VERIO test strip TEST TWICE DAILY  . potassium chloride SA (KLOR-CON M20) 20 MEQ tablet Take 1 tablet (20 mEq total) by mouth daily.  . primidone (MYSOLINE) 50 MG tablet TAKE 1 TABLET BY MOUTH EVERYDAY AT BEDTIME  . tamsulosin (FLOMAX) 0.4 MG CAPS capsule TAKE 1 CAPSULE DAILY IN THE  EVENING  . torsemide (DEMADEX) 20 MG tablet Take 1 tablet (20 mg total) by mouth daily. (Needs to be seen   No facility-administered encounter medications on file as of 12/31/2018.     Review of Systems  Constitutional: Negative for chills and fever.  Respiratory: Negative for shortness of breath and wheezing.   Cardiovascular: Negative for chest pain and leg swelling.  Musculoskeletal: Negative for back pain and gait problem.  Skin: Negative for rash.  Neurological: Negative for dizziness, weakness and light-headedness.  All other systems reviewed and are negative.   Observations/Objective: Patient sounds comfortable and in no acute distres Assessment and Plan: Problem List Items Addressed This Visit      Cardiovascular and Mediastinum   Essential hypertension (Chronic)   Relevant Medications   Evolocumab (REPATHA SURECLICK) 140 MG/ML SOAJ   atorvastatin (LIPITOR) 80 MG tablet   lisinopril (ZESTRIL) 2.5 MG tablet     Endocrine   Diabetes mellitus type 2 in obese (HCC) - Primary   Relevant Medications   atorvastatin (LIPITOR) 80 MG tablet   Insulin Degludec (TRESIBA FLEXTOUCH) 200 UNIT/ML SOPN   lisinopril (ZESTRIL) 2.5 MG tablet   metFORMIN (GLUCOPHAGE) 500 MG tablet   Diabetes mellitus with complication (HCC)   Relevant Medications   atorvastatin (LIPITOR) 80 MG tablet   Insulin Degludec (TRESIBA FLEXTOUCH) 200 UNIT/ML SOPN   lisinopril (ZESTRIL) 2.5 MG tablet   metFORMIN (GLUCOPHAGE) 500 MG tablet     Other   Myoclonus   Relevant Medications   clonazePAM (KLONOPIN) 0.5 MG tablet   Anxiety and depression   Relevant Medications   mirtazapine (REMERON) 7.5 MG tablet   DULoxetine (CYMBALTA) 60 MG capsule   Hyperlipidemia   Relevant Medications   Evolocumab (REPATHA SURECLICK) 140 MG/ML SOAJ   atorvastatin (LIPITOR) 80 MG tablet   lisinopril (ZESTRIL) 2.5 MG tablet       Follow Up Instructions: F/u in 3 months Patient's insurance had lapsed and he ran out of  insulin he ran out of some of his other medications so will get restarted on all his medications and sent refills for him.  Patient has not been checking his blood sugar so he does not know whether up for sure, well continue all of his medications and refill him including clonazepam.   I discussed the assessment and treatment plan with the patient. The patient was provided an opportunity to ask questions and all were answered. The patient agreed with the plan and demonstrated an understanding of the instructions.   The patient was advised to call back or seek an in-person evaluation if the symptoms worsen or if the condition fails to improve as anticipated.  The above assessment and management plan was discussed with the patient. The patient verbalized understanding of and has agreed to the management plan. Patient is aware to call the clinic if symptoms persist or worsen. Patient is aware when to return to the clinic for a follow-up visit. Patient educated on when it is appropriate to go to the emergency department.  I provided 30 minutes of non-face-to-face time during this encounter.    Worthy Rancher, MD

## 2019-01-03 ENCOUNTER — Other Ambulatory Visit: Payer: Self-pay | Admitting: Family Medicine

## 2019-01-03 DIAGNOSIS — E782 Mixed hyperlipidemia: Secondary | ICD-10-CM

## 2019-01-03 NOTE — Telephone Encounter (Signed)
Pharmacy comment:  Alternative Requested: NOT COVERED.  PRIOR AUTH REQUIRED OR ANOTHER DRUG;

## 2019-01-11 ENCOUNTER — Ambulatory Visit (INDEPENDENT_AMBULATORY_CARE_PROVIDER_SITE_OTHER): Payer: BC Managed Care – PPO | Admitting: *Deleted

## 2019-01-11 DIAGNOSIS — I255 Ischemic cardiomyopathy: Secondary | ICD-10-CM

## 2019-01-12 LAB — CUP PACEART REMOTE DEVICE CHECK
Battery Remaining Longevity: 121 mo
Battery Voltage: 3.02 V
Brady Statistic RV Percent Paced: 0 %
Date Time Interrogation Session: 20200908062605
HighPow Impedance: 72 Ohm
Implantable Lead Implant Date: 20180226
Implantable Lead Location: 753860
Implantable Pulse Generator Implant Date: 20180226
Lead Channel Impedance Value: 285 Ohm
Lead Channel Impedance Value: 399 Ohm
Lead Channel Pacing Threshold Amplitude: 1 V
Lead Channel Pacing Threshold Pulse Width: 0.4 ms
Lead Channel Sensing Intrinsic Amplitude: 12 mV
Lead Channel Sensing Intrinsic Amplitude: 12 mV
Lead Channel Setting Pacing Amplitude: 2.5 V
Lead Channel Setting Pacing Pulse Width: 0.4 ms
Lead Channel Setting Sensing Sensitivity: 0.3 mV

## 2019-01-14 ENCOUNTER — Other Ambulatory Visit: Payer: Self-pay | Admitting: Cardiology

## 2019-01-14 MED ORDER — CARVEDILOL 25 MG PO TABS: 25.0000 mg | ORAL_TABLET | Freq: Two times a day (BID) | ORAL | 0 refills | Status: DC

## 2019-01-18 ENCOUNTER — Other Ambulatory Visit: Payer: Self-pay | Admitting: Cardiology

## 2019-01-26 ENCOUNTER — Encounter: Payer: Self-pay | Admitting: Cardiology

## 2019-01-26 NOTE — Progress Notes (Signed)
Remote ICD transmission.   

## 2019-02-01 ENCOUNTER — Other Ambulatory Visit: Payer: Self-pay

## 2019-02-02 ENCOUNTER — Ambulatory Visit: Payer: Self-pay | Admitting: Family Medicine

## 2019-02-04 ENCOUNTER — Other Ambulatory Visit: Payer: Self-pay

## 2019-02-04 ENCOUNTER — Encounter: Payer: Self-pay | Admitting: Nurse Practitioner

## 2019-02-04 ENCOUNTER — Ambulatory Visit (INDEPENDENT_AMBULATORY_CARE_PROVIDER_SITE_OTHER): Payer: Medicare Other | Admitting: Nurse Practitioner

## 2019-02-04 VITALS — BP 148/87 | HR 96 | Temp 99.3°F | Resp 20 | Ht 70.0 in | Wt 223.0 lb

## 2019-02-04 DIAGNOSIS — L03116 Cellulitis of left lower limb: Secondary | ICD-10-CM

## 2019-02-04 DIAGNOSIS — Z23 Encounter for immunization: Secondary | ICD-10-CM | POA: Diagnosis not present

## 2019-02-04 DIAGNOSIS — I255 Ischemic cardiomyopathy: Secondary | ICD-10-CM

## 2019-02-04 DIAGNOSIS — S81802A Unspecified open wound, left lower leg, initial encounter: Secondary | ICD-10-CM | POA: Diagnosis not present

## 2019-02-04 MED ORDER — SULFAMETHOXAZOLE-TRIMETHOPRIM 800-160 MG PO TABS: 1.0000 | ORAL_TABLET | Freq: Two times a day (BID) | ORAL | 0 refills | Status: DC

## 2019-02-04 NOTE — Patient Instructions (Signed)
Wound Care, Adult Taking care of your wound properly can help to prevent pain, infection, and scarring. It can also help your wound to heal more quickly. How to care for your wound Wound care      Follow instructions from your health care provider about how to take care of your wound. Make sure you: ? Wash your hands with soap and water before you change the bandage (dressing). If soap and water are not available, use hand sanitizer. ? Change your dressing as told by your health care provider. ? Leave stitches (sutures), skin glue, or adhesive strips in place. These skin closures may need to stay in place for 2 weeks or longer. If adhesive strip edges start to loosen and curl up, you may trim the loose edges. Do not remove adhesive strips completely unless your health care provider tells you to do that.  Check your wound area every day for signs of infection. Check for: ? Redness, swelling, or pain. ? Fluid or blood. ? Warmth. ? Pus or a bad smell.  Ask your health care provider if you should clean the wound with mild soap and water. Doing this may include: ? Using a clean towel to pat the wound dry after cleaning it. Do not rub or scrub the wound. ? Applying a cream or ointment. Do this only as told by your health care provider. ? Covering the incision with a clean dressing.  Ask your health care provider when you can leave the wound uncovered.  Keep the dressing dry until your health care provider says it can be removed. Do not take baths, swim, use a hot tub, or do anything that would put the wound underwater until your health care provider approves. Ask your health care provider if you can take showers. You may only be allowed to take sponge baths. Medicines   If you were prescribed an antibiotic medicine, cream, or ointment, take or use the antibiotic as told by your health care provider. Do not stop taking or using the antibiotic even if your condition improves.  Take  over-the-counter and prescription medicines only as told by your health care provider. If you were prescribed pain medicine, take it 30 or more minutes before you do any wound care or as told by your health care provider. General instructions  Return to your normal activities as told by your health care provider. Ask your health care provider what activities are safe.  Do not scratch or pick at the wound.  Do not use any products that contain nicotine or tobacco, such as cigarettes and e-cigarettes. These may delay wound healing. If you need help quitting, ask your health care provider.  Keep all follow-up visits as told by your health care provider. This is important.  Eat a diet that includes protein, vitamin A, vitamin C, and other nutrient-rich foods to help the wound heal. ? Foods rich in protein include meat, dairy, beans, nuts, and other sources. ? Foods rich in vitamin A include carrots and dark green, leafy vegetables. ? Foods rich in vitamin C include citrus, tomatoes, and other fruits and vegetables. ? Nutrient-rich foods have protein, carbohydrates, fat, vitamins, or minerals. Eat a variety of healthy foods including vegetables, fruits, and whole grains. Contact a health care provider if:  You received a tetanus shot and you have swelling, severe pain, redness, or bleeding at the injection site.  Your pain is not controlled with medicine.  You have redness, swelling, or pain around the wound.    You have fluid or blood coming from the wound.  Your wound feels warm to the touch.  You have pus or a bad smell coming from the wound.  You have a fever or chills.  You are nauseous or you vomit.  You are dizzy. Get help right away if:  You have a red streak going away from your wound.  The edges of the wound open up and separate.  Your wound is bleeding, and the bleeding does not stop with gentle pressure.  You have a rash.  You faint.  You have trouble breathing.  Summary  Always wash your hands with soap and water before changing your bandage (dressing).  To help with healing, eat foods that are rich in protein, vitamin A, vitamin C, and other nutrients.  Check your wound every day for signs of infection. Contact your health care provider if you suspect that your wound is infected. This information is not intended to replace advice given to you by your health care provider. Make sure you discuss any questions you have with your health care provider. Document Released: 01/29/2008 Document Revised: 08/09/2018 Document Reviewed: 11/06/2015 Elsevier Patient Education  2020 [employer]  

## 2019-02-04 NOTE — Addendum Note (Signed)
Addended by: Rolena Infante on: 02/04/2019 03:04 PM   Modules accepted: Orders

## 2019-02-04 NOTE — Progress Notes (Signed)
   Subjective:    Patient ID: Douglas Carr, male    DOB: [birth year], 53 y.o.   MRN: [id number]   Chief Complaint: Left leg red and swollen   HPI Patient come sn today stating he fell last Friday and cut his left shim. It is swollen with surrounding erythema. Denies ay drainage.   Review of Systems  Constitutional: Negative for activity change and appetite change.  HENT: Negative.   Eyes: Negative for pain.  Respiratory: Negative for shortness of breath.   Cardiovascular: Negative for chest pain, palpitations and leg swelling.  Gastrointestinal: Negative for abdominal pain.  Endocrine: Negative for polydipsia.  Genitourinary: Negative.   Skin: Negative for rash.  Neurological: Negative for dizziness, weakness and headaches.  Hematological: Does not bruise/bleed easily.  Psychiatric/Behavioral: Negative.   All other systems reviewed and are negative.      Objective:   Physical Exam Vitals signs and nursing note reviewed.  Constitutional:      Appearance: Normal appearance.  Cardiovascular:     Rate and Rhythm: Normal rate and regular rhythm.     Heart sounds: Normal heart sounds.  Pulmonary:     Effort: Pulmonary effort is normal.     Breath sounds: Normal breath sounds.  Skin:    General: Skin is warm and dry.     Comments: 12 CM scabbed over laceration to left shin with surrounding erythema and edema  Neurological:     General: No focal deficit present.     Mental Status: He is alert and oriented to person, place, and time.  Psychiatric:        Mood and Affect: Mood normal.        Behavior: Behavior normal.     BP (!) 148/87   Pulse 96   Temp 99.3 F (37.4 C) (Temporal)   Resp 20   Ht 5\' 10"  (1.778 m)   Wt 223 lb (101.2 kg)   SpO2 99%   BMI 32.00 kg/m        Assessment & Plan:  TYRICK DUNAGAN in today with chief complaint of Left leg red and swollen   1. Cellulitis of left lower extremity Clean with antibacterial soap daily Antibiotic  ointment RTO prn - sulfamethoxazole-trimethoprim (BACTRIM DS) 800-160 MG tablet; Take 1 tablet by mouth 2 (two) times daily.  Dispense: 20 tablet; Refill: 0  Mary-Margaret Hassell Done, FNP

## 2019-02-14 ENCOUNTER — Encounter: Payer: Self-pay | Admitting: Family Medicine

## 2019-02-14 ENCOUNTER — Ambulatory Visit (INDEPENDENT_AMBULATORY_CARE_PROVIDER_SITE_OTHER): Payer: Medicare Other | Admitting: Family Medicine

## 2019-02-14 DIAGNOSIS — L03116 Cellulitis of left lower limb: Secondary | ICD-10-CM

## 2019-02-14 MED ORDER — SULFAMETHOXAZOLE-TRIMETHOPRIM 800-160 MG PO TABS: 1.0000 | ORAL_TABLET | Freq: Two times a day (BID) | ORAL | 0 refills | Status: AC

## 2019-02-14 NOTE — Progress Notes (Signed)
Virtual Visit via telephone Note Due to COVID-19 pandemic this visit was conducted virtually. This visit type was conducted due to national recommendations for restrictions regarding the COVID-19 Pandemic (e.g. social distancing, sheltering in place) in an effort to limit this patient's exposure and mitigate transmission in our community. All issues noted in this document were discussed and addressed.  A physical exam was not performed with this format.   I connected with Douglas Carr on 02/14/19 at 1005 by telephone and verified that I am speaking with the correct person using two identifiers. Douglas Carr is currently located at home and family is currently with them during visit. The provider, Kari Baars, FNP is located in their office at time of visit.  I discussed the limitations, risks, security and privacy concerns of performing an evaluation and management service by telephone and the availability of in person appointments. I also discussed with the patient that there may be a patient responsible charge related to this service. The patient expressed understanding and agreed to proceed.  Subjective:  Patient ID: Douglas Carr, male    DOB: 19-Apr-1966, 53 y.o.   MRN: [id number]  Chief Complaint:  Cellulitis   HPI: Douglas Carr is a 53 y.o. male presenting on 02/14/2019 for Cellulitis   Pt presents today for follow up of left lower extremity cellulitis. Pt states he was seen on 02/04/2019 and placed on bactrim for this. States the pharmacy only provided him with 5 pills of the bactrim and he completed those pills. States the redness, swelling, and tenderness around the laceration continues. States the swelling and redness has decreased some but is still present. He denies fever, chills, weakness, or confusion.  Upon chart review, it was noted that pt was prescribed a ten day supply of Bactrim, 20 pills total. [facility] [place], [place] [facility] contacted and they  confirmed that only 5 pills were dispensed to pt and they could not provide an explanation as to why. These 5 pills were dispensed on 02/04/2019.     Relevant past medical, surgical, family, and social history reviewed and updated as indicated.  Allergies and medications reviewed and updated.   Past Medical History:  Diagnosis Date  . Acute ST elevation myocardial infarction (STEMI) involving left circumflex coronary artery (HCC) 05/07/2016   PCI to Cx-OM  . Anginal pain (HCC)    secondary to sm. vessel disease  . Anxiety   . Arthritis    BIL KNEE PAIN AND BIL ANKLE PAIN  . Bone spur of ankle   . Cardiac arrest (HCC) 05/24/2016   with v fib  . 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 Coronary artery disease involving native coronary artery of native heart with angina pectoris (HCC) 05/24/2016   Remote MI at 53 years of age, Last cath 2010-diffuse non-obstructive disease; last echo 06/16/08 -normal LV function, moderate concentric hypertrophy; nuc 08/2008 no ischemia;  medical therapy; STEMI May 07 2016 - PCI to Cx-OM  . Diabetes mellitus    ON ORAL MEDICATION AND INSULIN  . Dilated cardiomyopathy (HCC) 05/24/2016   EF 325-30% by Echo post STEMI (previously 30-35%)   . Hyperlipidemia   . Hypertension   . Myocardial infarction (HCC) 1996   Post MI  . Peripheral vascular disease (HCC)    HAS LEFT CAROTID ARTERY STENOSIS   AND IS S/P RIGHT CAROTID ENDARTERECTOMY 2010 Last carotid dopplers 01/08/2012 wth patent endarterectomy site  . Status post coronary  artery stent placement     Past Surgical History:  Procedure Laterality Date  . APPENDECTOMY    . CARDIAC CATHETERIZATION     FEB 2010, significant branch vessel disease wth diag, marginal, PDA & PLA, nml. LV function  . CARDIAC CATHETERIZATION N/A 05/07/2016   Procedure: Left Heart Cath and Coronary Angiography;  Surgeon: Peter M Swaziland, MD;  Location[facility] INVASIVE CV LAB;  Service:  Cardiovascular;  Laterality: N/A;  . CARDIAC CATHETERIZATION N/A 05/07/2016   Procedure: Coronary Stent Intervention;  Surgeon: Peter M Swaziland, MD;  Location: [facility] INVASIVE CV LAB;  Service: Cardiovascular;  Laterality: N/A;  . CARDIAC CATHETERIZATION N/A 05/24/2016   Procedure: Left Heart Cath and Coronary Angiography;  Surgeon: Marykay Lex, MD;  Location: [facility] INVASIVE CV LAB;  Service: Cardiovascular;  Laterality: N/A;  . CARDIAC CATHETERIZATION N/A 05/24/2016   Procedure: Coronary Stent Intervention;  Surgeon: Marykay Lex, MD;  Location: [facility] INVASIVE CV LAB;  Service: Cardiovascular;  Laterality: N/A;  2.5x20 Promus to Ostial/proximal circumflex  . CAROTID ENDARTERECTOMY  09/2008   Rt CEA  . ICD IMPLANT N/A 06/30/2016   Procedure: ICD Implant;  Surgeon: Will Jorja Loa, MD;  Location: [facility] INVASIVE CV LAB;  Service: Cardiovascular;  Laterality: N/A;  . LUNG BIOPSY  2013   Bx's suggest granulomatous dz.  . RT ANKLE   2013    Social History   Socioeconomic History  . Marital status: Married    Spouse name: Erskine Squibb  . Number of children: 3  . Years of education: Not on file  . Highest education level: Not on file  Occupational History  . Occupation: Midwife for [employer]    Employer: [employer]   Social Needs  . Financial resource strain: Not on file  . Food insecurity    Worry: Not on file    Inability: Not on file  . Transportation needs    Medical: Not on file    Non-medical: Not on file  Tobacco Use  . Smoking status: Former Smoker    Packs/day: 2.00    Years: 22.00    Pack years: 44.00    Types: Cigarettes    Quit date: 10/23/2011    Years since quitting: 7.3  . Smokeless tobacco: Current User    Types: Snuff  Substance and Sexual Activity  . Alcohol use: No  . Drug use: No  . Sexual activity: Not on file  Lifestyle  . Physical activity    Days per week: Not on file    Minutes per session: Not on file  . Stress: Not on file  Relationships  . Social Wellsite geologist on phone: Not on file    Gets together: Not on file    Attends religious service: Not on file    Active member of club or organization: Not on file    Attends meetings of clubs or organizations: Not on file    Relationship status: Not on file  . Intimate partner violence    Fear of current or ex partner: Not on file    Emotionally abused: Not on file    Physically abused: Not on file    Forced sexual activity: Not on file  Other Topics Concern  . Not on file  Social History Narrative   Pt lives with family in [place], [place].    Outpatient Encounter Medications as of 02/14/2019  Medication Sig  . acetaminophen (TYLENOL) 325 MG tablet Take 2 tablets (650 mg total) by mouth  every 4 (four) hours as needed for headache or mild pain.  . Alirocumab (PRALUENT) 75 MG/ML SOAJ Inject 75 mg into the skin every 14 (fourteen) days.  Marland Kitchen. aspirin EC 81 MG tablet Take 81 mg by mouth daily.  Marland Kitchen. atorvastatin (LIPITOR) 80 MG tablet Take 1 tablet (80 mg total) by mouth daily.  . carvedilol (COREG) 25 MG tablet Take 1 tablet (25 mg total) by mouth 2 (two) times daily. Please make overdue appt with Dr. Elberta Fortisamnitz and keep appt before anymore refills. Final Attempt  . clonazePAM (KLONOPIN) 0.5 MG tablet Take 1 tablet (0.5 mg total) by mouth 2 (two) times daily.  . clopidogrel (PLAVIX) 75 MG tablet TAKE 1 TABLET BY MOUTH EVERY DAY IN THE EVENING  . DULoxetine (CYMBALTA) 60 MG capsule Take 1 capsule (60 mg total) by mouth daily.  Marland Kitchen. ibuprofen (ADVIL,MOTRIN) 200 MG tablet Take 200 mg by mouth every 6 (six) hours as needed for moderate pain.  . Insulin Degludec (TRESIBA FLEXTOUCH) 200 UNIT/ML SOPN Inject 116 Units into the skin daily.  . Insulin Pen Needle (B-D ULTRAFINE III SHORT PEN) 31G X 8 MM MISC USE TO INJECT INSULIN DAILY DX E11.9  . lisinopril (ZESTRIL) 2.5 MG tablet Take 1 tablet (2.5 mg total) by mouth daily.  . metFORMIN (GLUCOPHAGE) 500 MG tablet Take 1 tablet (500 mg total) by mouth 2 (two) times daily.   . mirtazapine (REMERON) 7.5 MG tablet TAKE 1 TABLET AT BEDTIME FOR DEPRESSION  . nitroGLYCERIN (NITROSTAT) 0.4 MG SL tablet PLACE 1 TABLET (0.4 MG TOTAL) UNDER THE TONGUE EVERY 5 (FIVE) MINUTES AS NEEDED FOR CHEST PAIN.  Marland Kitchen. ONETOUCH VERIO test strip TEST TWICE DAILY  . potassium chloride SA (KLOR-CON M20) 20 MEQ tablet Take 1 tablet (20 mEq total) by mouth daily.  . primidone (MYSOLINE) 50 MG tablet TAKE 1 TABLET BY MOUTH EVERYDAY AT BEDTIME  . sulfamethoxazole-trimethoprim (BACTRIM DS) 800-160 MG tablet Take 1 tablet by mouth 2 (two) times daily for 10 days.  . tamsulosin (FLOMAX) 0.4 MG CAPS capsule TAKE 1 CAPSULE DAILY IN THE EVENING  . torsemide (DEMADEX) 20 MG tablet Take 1 tablet (20 mg total) by mouth daily. (Needs to be seen  . [DISCONTINUED] sulfamethoxazole-trimethoprim (BACTRIM DS) 800-160 MG tablet Take 1 tablet by mouth 2 (two) times daily.   No facility-administered encounter medications on file as of 02/14/2019.     No Known Allergies  Review of Systems  Constitutional: Negative for activity change, appetite change, chills, diaphoresis, fatigue, fever and unexpected weight change.  HENT: Negative.   Eyes: Negative.   Respiratory: Negative for cough, chest tightness and shortness of breath.   Cardiovascular: Positive for leg swelling (left lower). Negative for chest pain and palpitations.  Gastrointestinal: Negative for blood in stool, constipation, diarrhea, nausea and vomiting.  Endocrine: Negative.   Genitourinary: Negative for decreased urine volume, difficulty urinating, dysuria, frequency and urgency.  Musculoskeletal: Negative for arthralgias, joint swelling and myalgias.  Skin: Positive for color change and wound (left shin).  Allergic/Immunologic: Negative.   Neurological: Negative for dizziness, weakness and headaches.  Hematological: Negative.   Psychiatric/Behavioral: Negative for confusion, hallucinations, sleep disturbance and suicidal ideas.  All other  systems reviewed and are negative.        Observations/Objective: No vital signs or physical exam, this was a telephone or virtual health encounter.  Pt alert and oriented, answers all questions appropriately, and able to speak in full sentences.    Assessment and Plan: Douglas Carr was seen today for cellulitis.  Diagnoses and all orders for this visit:  Cellulitis of left lower extremity Ongoing cellulitis of left lower extremity. [facility] [place], [place] not dispense the correct amount of bactrim, a ten day supply was written and they only dispensed 5 pills. Pt took all 5 pills without resolution of symptoms. A new 10 day supply was prescribed today [facility] that the full ten day supply needed to be dispensed to the pt. Pt aware of the importance of taking the full 10 day supply of medication and to follow up in 7-10 days for reevaluation. Pt aware to call office if there is any further complications with getting the prescribed amount of Bactrim.  -     sulfamethoxazole-trimethoprim (BACTRIM DS) 800-160 MG tablet; Take 1 tablet by mouth 2 (two) times daily for 10 days.     Follow Up Instructions: Return in about 1 week (around 02/21/2019), or if symptoms worsen or fail to improve, for cellulitis.    I discussed the assessment and treatment plan with the patient. The patient was provided an opportunity to ask questions and all were answered. The patient agreed with the plan and demonstrated an understanding of the instructions.   The patient was advised to call back or seek an in-person evaluation if the symptoms worsen or if the condition fails to improve as anticipated.  The above assessment and management plan was discussed with the patient. The patient verbalized understanding of and has agreed to the management plan. Patient is aware to call the clinic if they develop any new symptoms or if symptoms persist or worsen. Patient is aware when to return to the  clinic for a follow-up visit. Patient educated on when it is appropriate to go to the emergency department.    I provided 15 minutes of non-face-to-face time during this encounter. The call started at 1005. The call ended at 1025. The other time was used for coordination of care.    Monia Pouch, FNP-C [facility] [address] [place], [place] [address] [phone] 02/14/19

## 2019-02-16 ENCOUNTER — Ambulatory Visit: Payer: Self-pay | Admitting: Family Medicine

## 2019-03-29 ENCOUNTER — Other Ambulatory Visit: Payer: Self-pay

## 2019-03-30 ENCOUNTER — Encounter: Payer: Self-pay | Admitting: Family Medicine

## 2019-03-30 ENCOUNTER — Ambulatory Visit (INDEPENDENT_AMBULATORY_CARE_PROVIDER_SITE_OTHER): Payer: Medicare Other | Admitting: Family Medicine

## 2019-03-30 VITALS — BP 163/88 | HR 95 | Temp 97.5°F | Ht 70.0 in | Wt 227.6 lb

## 2019-03-30 DIAGNOSIS — E1169 Type 2 diabetes mellitus with other specified complication: Secondary | ICD-10-CM

## 2019-03-30 DIAGNOSIS — I255 Ischemic cardiomyopathy: Secondary | ICD-10-CM | POA: Diagnosis not present

## 2019-03-30 DIAGNOSIS — E669 Obesity, unspecified: Secondary | ICD-10-CM

## 2019-03-30 DIAGNOSIS — E118 Type 2 diabetes mellitus with unspecified complications: Secondary | ICD-10-CM

## 2019-03-30 DIAGNOSIS — F419 Anxiety disorder, unspecified: Secondary | ICD-10-CM | POA: Diagnosis not present

## 2019-03-30 DIAGNOSIS — F32A Depression, unspecified: Secondary | ICD-10-CM

## 2019-03-30 DIAGNOSIS — I10 Essential (primary) hypertension: Secondary | ICD-10-CM | POA: Diagnosis not present

## 2019-03-30 DIAGNOSIS — G253 Myoclonus: Secondary | ICD-10-CM

## 2019-03-30 DIAGNOSIS — F329 Major depressive disorder, single episode, unspecified: Secondary | ICD-10-CM

## 2019-03-30 DIAGNOSIS — E782 Mixed hyperlipidemia: Secondary | ICD-10-CM

## 2019-03-30 LAB — BAYER DCA HB A1C WAIVED: HB A1C (BAYER DCA - WAIVED): 8 % — ABNORMAL HIGH (ref <7.0)

## 2019-03-30 MED ORDER — TAMSULOSIN HCL 0.4 MG PO CAPS: ORAL_CAPSULE | ORAL | 3 refills | Status: DC

## 2019-03-30 MED ORDER — LISINOPRIL 2.5 MG PO TABS: 2.5000 mg | ORAL_TABLET | Freq: Every day | ORAL | 3 refills | Status: DC

## 2019-03-30 MED ORDER — TORSEMIDE 20 MG PO TABS: 20.0000 mg | ORAL_TABLET | Freq: Every day | ORAL | 3 refills | Status: DC

## 2019-03-30 MED ORDER — CARVEDILOL 25 MG PO TABS: 25.0000 mg | ORAL_TABLET | Freq: Two times a day (BID) | ORAL | 3 refills | Status: DC

## 2019-03-30 MED ORDER — METFORMIN HCL 500 MG PO TABS: 500.0000 mg | ORAL_TABLET | Freq: Two times a day (BID) | ORAL | 3 refills | Status: DC

## 2019-03-30 MED ORDER — DULOXETINE HCL 60 MG PO CPEP: 60.0000 mg | ORAL_CAPSULE | Freq: Every day | ORAL | 3 refills | Status: DC

## 2019-03-30 MED ORDER — MIRTAZAPINE 7.5 MG PO TABS: ORAL_TABLET | ORAL | 3 refills | Status: DC

## 2019-03-30 MED ORDER — ATORVASTATIN CALCIUM 80 MG PO TABS: 80.0000 mg | ORAL_TABLET | Freq: Every day | ORAL | 3 refills | Status: DC

## 2019-03-30 MED ORDER — CLOPIDOGREL BISULFATE 75 MG PO TABS: ORAL_TABLET | ORAL | 3 refills | Status: DC

## 2019-03-30 MED ORDER — CLONAZEPAM 0.5 MG PO TABS: 0.5000 mg | ORAL_TABLET | Freq: Two times a day (BID) | ORAL | 2 refills | Status: DC

## 2019-03-30 MED ORDER — OZEMPIC (0.25 OR 0.5 MG/DOSE) 2 MG/1.5ML ~~LOC~~ SOPN: 0.5000 mg | PEN_INJECTOR | SUBCUTANEOUS | 3 refills | Status: DC

## 2019-03-30 NOTE — Progress Notes (Signed)
BP (!) 163/88   Pulse 95   Temp (!) 97.5 F (36.4 C) (Temporal)   Ht 5\' 10"  (1.778 m)   Wt 227 lb 9.6 oz (103.2 kg)   SpO2 95%   BMI 32.66 kg/m    Subjective:   Patient ID: Douglas Carr, male    DOB: 29-Aug-1965, 53 y.o.   MRN: [id number]  HPI: NIV DARLEY is a 53 y.o. male presenting on 03/30/2019 for Diabetes (6 month follow up) and Hypertension   HPI Type 2 diabetes mellitus Patient comes in today for recheck of his diabetes. Patient has been currently taking metformin and [place]. Patient is currently on an ACE inhibitor/ARB. Patient has seen an ophthalmologist this year. Patient denies any issues with their feet.  Patient does have a history of extensive cardiac coronary artery disease  Hypertension Patient is currently on lisinopril and carvedilol and torsemide, and their blood pressure today is 163/88. Patient denies any lightheadedness or dizziness. Patient denies headaches, blurred vision, chest pains, shortness of breath, or weakness. Denies any side effects from medication and is content with current medication.  Patient forgot to take his medication for the last couple days not why his blood pressure is up  Hyperlipidemia Patient is coming in for recheck of his hyperlipidemia. The patient is currently taking Praluent and Lipitor. They deny any issues with myalgias or history of liver damage from it. They deny any focal numbness or weakness or chest pain.   Patient is coming in for anxiety and depression recheck and he is currently on Cymbalta and feels like he is doing pretty well. Current rx-clonazepam 0.5 mg twice daily as needed # meds rx-60 Effectiveness of current meds-works well helps control anxiety and tremors Adverse reactions form meds-none  Pill count performed-No Last drug screen -N/A ( high risk q77m, moderate risk q33m, low risk yearly ) Urine drug screen today- Yes Was the [facility] reviewed-yes  If yes were their any concerning findings?  -None  No flowsheet data found.   Controlled substance contract signed on: Today  Relevant past medical, surgical, family and social history reviewed and updated as indicated. Interim medical history since our last visit reviewed. Allergies and medications reviewed and updated.  Review of Systems  Constitutional: Negative for chills and fever.  Respiratory: Negative for shortness of breath and wheezing.   Cardiovascular: Negative for chest pain and leg swelling.  Musculoskeletal: Negative for back pain and gait problem.  Skin: Negative for rash.  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 Negative for dizziness and light-headedness.  Psychiatric/Behavioral: Negative for dysphoric mood, self-injury, sleep disturbance and suicidal ideas. The patient is nervous/anxious.   All other systems reviewed and are negative.   Per HPI unless specifically indicated above   Allergies as of 03/30/2019   No Known Allergies     Medication List       Accurate as of March 30, 2019  2:02 PM. If you have any questions, ask your nurse or doctor.        acetaminophen 325 MG tablet Commonly known as: TYLENOL Take 2 tablets (650 mg total) by mouth every 4 (four) hours as needed for headache or mild pain.   aspirin EC 81 MG tablet Take 81 mg by mouth daily.   atorvastatin 80 MG tablet Commonly known as: LIPITOR Take 1 tablet (80 mg total) by mouth daily.   carvedilol 25 MG tablet Commonly known as: COREG Take 1 tablet (25 mg  total) by mouth 2 (two) times daily. Please make overdue appt with Dr. Elberta Fortis and keep appt before anymore refills. Final Attempt   clonazePAM 0.5 MG tablet Commonly known as: KLONOPIN Take 1 tablet (0.5 mg total) by mouth 2 (two) times daily.   clopidogrel 75 MG tablet Commonly known as: PLAVIX TAKE 1 TABLET BY MOUTH EVERY DAY IN THE EVENING   DULoxetine 60 MG capsule Commonly known as: CYMBALTA Take 1  capsule (60 mg total) by mouth daily.   ibuprofen 200 MG tablet Commonly known as: ADVIL Take 200 mg by mouth every 6 (six) hours as needed for moderate pain.   Insulin Pen Needle 31G X 8 MM Misc Commonly known as: B-D ULTRAFINE III SHORT PEN USE TO INJECT INSULIN DAILY DX E11.9   lisinopril 2.5 MG tablet Commonly known as: ZESTRIL Take 1 tablet (2.5 mg total) by mouth daily.   metFORMIN 500 MG tablet Commonly known as: GLUCOPHAGE Take 1 tablet (500 mg total) by mouth 2 (two) times daily.   mirtazapine 7.5 MG tablet Commonly known as: REMERON TAKE 1 TABLET AT BEDTIME FOR DEPRESSION   nitroGLYCERIN 0.4 MG SL tablet Commonly known as: NITROSTAT PLACE 1 TABLET (0.4 MG TOTAL) UNDER THE TONGUE EVERY 5 (FIVE) MINUTES AS NEEDED FOR CHEST PAIN.   OneTouch Verio test strip Generic drug: glucose blood TEST TWICE DAILY   Ozempic (0.25 or 0.5 MG/DOSE) 2 MG/1.5ML Sopn Generic drug: Semaglutide(0.25 or 0.5MG /DOS) Inject 0.5 mg into the skin once a week. Started by: [name] , MD   potassium chloride SA 20 MEQ tablet Commonly known as: [place]-Con M20 Take 1 tablet (20 mEq total) by mouth daily.   Praluent 75 MG/ML Soaj Generic drug: Alirocumab Inject 75 mg into the skin every 14 (fourteen) days.   primidone 50 MG tablet Commonly known as: MYSOLINE TAKE 1 TABLET BY MOUTH EVERYDAY AT BEDTIME   tamsulosin 0.4 MG Caps capsule Commonly known as: FLOMAX TAKE 1 CAPSULE DAILY IN THE EVENING   torsemide 20 MG tablet Commonly known as: DEMADEX Take 1 tablet (20 mg total) by mouth daily. (Needs to be seen   Evaristo Bury FlexTouch 200 UNIT/ML Sopn Generic drug: Insulin Degludec Inject 116 Units into the skin daily.        Objective:   BP (!) 163/88   Pulse 95   Temp (!) 97.5 F (36.4 C) (Temporal)   Ht 5\' 10"  (1.778 m)   Wt 227 lb 9.6 oz (103.2 kg)   SpO2 95%   BMI 32.66 kg/m   Wt Readings from Last 3 Encounters:  03/30/19 227 lb 9.6 oz (103.2 kg)  02/04/19 223 lb  (101.2 kg)  09/13/18 231 lb 6.4 oz (105 kg)    Physical Exam Vitals signs and nursing note reviewed.  Constitutional:      General: He is not in acute distress.    Appearance: He is well-developed. He is not diaphoretic.  Eyes:     General: No scleral icterus.    Conjunctiva/sclera: Conjunctivae normal.  Neck:     Musculoskeletal: Neck supple.     Thyroid: No thyromegaly.  Cardiovascular:     Rate and Rhythm: Normal rate and regular rhythm.     Heart sounds: Normal heart sounds. No murmur.  Pulmonary:     Effort: Pulmonary effort is normal. No respiratory distress.     Breath sounds: Normal breath sounds. No wheezing.  Lymphadenopathy:     Cervical: No cervical adenopathy.  Skin:    General: Skin is warm  and dry.     Findings: No rash.  Neurological:     Mental Status: He is alert and oriented to person, place, and time.     Coordination: Coordination normal.  Psychiatric:        Mood and Affect: Mood is anxious. Mood is not depressed.        Behavior: Behavior normal.       Assessment & Plan:   Problem List Items Addressed This Visit      Cardiovascular and Mediastinum   Essential hypertension (Chronic)   Relevant Medications   atorvastatin (LIPITOR) 80 MG tablet   carvedilol (COREG) 25 MG tablet   lisinopril (ZESTRIL) 2.5 MG tablet   torsemide (DEMADEX) 20 MG tablet     Endocrine   Diabetes mellitus type 2 in obese (HCC) - Primary   Relevant Medications   atorvastatin (LIPITOR) 80 MG tablet   lisinopril (ZESTRIL) 2.5 MG tablet   metFORMIN (GLUCOPHAGE) 500 MG tablet   Semaglutide,0.25 or 0.5MG /DOS, (OZEMPIC, 0.25 OR 0.5 MG/DOSE,) 2 MG/1.5ML SOPN   Other Relevant Orders   hgba1c   Diabetes mellitus with complication (HCC)   Relevant Medications   atorvastatin (LIPITOR) 80 MG tablet   lisinopril (ZESTRIL) 2.5 MG tablet   metFORMIN (GLUCOPHAGE) 500 MG tablet   Semaglutide,0.25 or 0.5MG /DOS, (OZEMPIC, 0.25 OR 0.5 MG/DOSE,) 2 MG/1.5ML SOPN     Other    Myoclonus   Relevant Medications   clonazePAM (KLONOPIN) 0.5 MG tablet   Anxiety and depression   Relevant Medications   DULoxetine (CYMBALTA) 60 MG capsule   mirtazapine (REMERON) 7.5 MG tablet   Other Relevant Orders   DRUG SCREEN-TOXASSURE   Hyperlipidemia   Relevant Medications   atorvastatin (LIPITOR) 80 MG tablet   carvedilol (COREG) 25 MG tablet   lisinopril (ZESTRIL) 2.5 MG tablet   torsemide (DEMADEX) 20 MG tablet      Patient forgot to take his medication for the last couple days and that is why it is up.  A1c is improved at 8.0 but still elevated, we will add Ozempic and see how he does with that. Follow up plan: Return in about 3 months (around 06/30/2019), or if symptoms worsen or fail to improve, for Anxiety and depression and cholesterol.  Counseling provided for all of the vaccine components Orders Placed This Encounter  Procedures  . hgba1c  . DRUG SCREEN-TOXASSURE    Arville CareJoshua , MD [facility] 03/30/2019, 2:02 PM

## 2019-04-01 ENCOUNTER — Ambulatory Visit: Payer: Self-pay | Admitting: Family Medicine

## 2019-04-04 ENCOUNTER — Telehealth: Payer: Self-pay | Admitting: Family Medicine

## 2019-04-04 LAB — TOXASSURE SELECT 13 (MW), URINE

## 2019-04-04 NOTE — Telephone Encounter (Signed)
Patient came back positive for marijuana and amphetamines and barbiturates that are not prescribed for him, we will no longer prescribe the clonazepam in the future because of this.

## 2019-04-12 ENCOUNTER — Ambulatory Visit (INDEPENDENT_AMBULATORY_CARE_PROVIDER_SITE_OTHER): Payer: BC Managed Care – PPO | Admitting: *Deleted

## 2019-04-12 DIAGNOSIS — I5042 Chronic combined systolic (congestive) and diastolic (congestive) heart failure: Secondary | ICD-10-CM

## 2019-04-13 LAB — CUP PACEART REMOTE DEVICE CHECK
Battery Remaining Longevity: 119 mo
Battery Voltage: 3.02 V
Brady Statistic RV Percent Paced: 0 %
Date Time Interrogation Session: 20201208043725
HighPow Impedance: 76 Ohm
Implantable Lead Implant Date: 20180226
Implantable Lead Location: 753860
Implantable Pulse Generator Implant Date: 20180226
Lead Channel Impedance Value: 285 Ohm
Lead Channel Impedance Value: 399 Ohm
Lead Channel Pacing Threshold Amplitude: 0.875 V
Lead Channel Pacing Threshold Pulse Width: 0.4 ms
Lead Channel Sensing Intrinsic Amplitude: 14.875 mV
Lead Channel Sensing Intrinsic Amplitude: 14.875 mV
Lead Channel Setting Pacing Amplitude: 2.5 V
Lead Channel Setting Pacing Pulse Width: 0.4 ms
Lead Channel Setting Sensing Sensitivity: 0.3 mV

## 2019-05-18 ENCOUNTER — Other Ambulatory Visit: Payer: Self-pay | Admitting: Neurology

## 2019-05-19 ENCOUNTER — Telehealth: Payer: Self-pay | Admitting: *Deleted

## 2019-05-19 NOTE — Telephone Encounter (Addendum)
Prior Auth for Praluent 75mg /ml-Your PA has been resolved, no additional PA is required. For further inquiries please contact the number on the back of the member prescription card. (Message [phone])  (Key: )

## 2019-05-23 ENCOUNTER — Telehealth: Payer: Self-pay

## 2019-05-23 DIAGNOSIS — E782 Mixed hyperlipidemia: Secondary | ICD-10-CM

## 2019-05-23 MED ORDER — PRALUENT 150 MG/ML ~~LOC~~ SOAJ: 150.0000 mg | SUBCUTANEOUS | 11 refills | Status: DC

## 2019-05-23 NOTE — Telephone Encounter (Signed)
Called and spoke w/pt about the approval of the praluent 150mg  rx sent and pt instructed to apply for the [employer]

## 2019-06-29 ENCOUNTER — Other Ambulatory Visit: Payer: Self-pay

## 2019-06-30 ENCOUNTER — Encounter: Payer: Self-pay | Admitting: Family Medicine

## 2019-06-30 ENCOUNTER — Ambulatory Visit: Payer: Medicare Other | Admitting: Family Medicine

## 2019-06-30 VITALS — BP 136/74 | HR 82 | Temp 98.4°F | Ht 70.0 in | Wt 209.0 lb

## 2019-06-30 DIAGNOSIS — E118 Type 2 diabetes mellitus with unspecified complications: Secondary | ICD-10-CM

## 2019-06-30 DIAGNOSIS — E1169 Type 2 diabetes mellitus with other specified complication: Secondary | ICD-10-CM

## 2019-06-30 DIAGNOSIS — I10 Essential (primary) hypertension: Secondary | ICD-10-CM

## 2019-06-30 DIAGNOSIS — E669 Obesity, unspecified: Secondary | ICD-10-CM

## 2019-06-30 DIAGNOSIS — E782 Mixed hyperlipidemia: Secondary | ICD-10-CM | POA: Diagnosis not present

## 2019-06-30 LAB — BAYER DCA HB A1C WAIVED: HB A1C (BAYER DCA - WAIVED): >14 % — ABNORMAL HIGH (ref <7.0)

## 2019-06-30 MED ORDER — TOPIRAMATE 25 MG PO TABS: 25.0000 mg | ORAL_TABLET | Freq: Two times a day (BID) | ORAL | 3 refills | Status: DC

## 2019-06-30 MED ORDER — BLOOD GLUCOSE MONITOR KIT: 1.0000 | PACK | Freq: Four times a day (QID) | 0 refills | Status: DC | PRN

## 2019-06-30 MED ORDER — OZEMPIC (0.25 OR 0.5 MG/DOSE) 2 MG/1.5ML ~~LOC~~ SOPN: 0.5000 mg | PEN_INJECTOR | SUBCUTANEOUS | 3 refills | Status: DC

## 2019-06-30 MED ORDER — ONETOUCH VERIO VI STRP: 1.0000 | ORAL_STRIP | Freq: Four times a day (QID) | 11 refills | Status: DC

## 2019-06-30 NOTE — Progress Notes (Signed)
BP 136/74   Pulse 82   Temp 98.4 F (36.9 C) (Temporal)   Ht 5' 10"  (1.778 m)   Wt 209 lb (94.8 kg)   SpO2 98%   BMI 29.99 kg/m    Subjective:   Patient ID: Douglas Carr, male    DOB: 09/10/1965, 54 y.o.   MRN: [id number]  HPI: Douglas Carr is a 54 y.o. male presenting on 06/30/2019 for Diabetes (3 month follow up )   HPI Type 2 diabetes mellitus Patient comes in today for recheck of his diabetes. Patient has been currently taking [place] and metformin. Patient is currently on an ACE inhibitor/ARB. Patient has not seen an ophthalmologist this year. Patient denies any issues with their feet.  Patient has not been taking his insulin and his A1c is greater than 14.  Hyperlipidemia Patient is coming in for recheck of his hyperlipidemia. The patient is currently taking atorvastatin. They deny any issues with myalgias or history of liver damage from it. They deny any focal numbness or weakness or chest pain.   Hypertension Patient is currently on lisinopril and carvedilol and torsemide, and their blood pressure today is 136/74. Patient denies any lightheadedness or dizziness. Patient denies headaches, blurred vision, chest pains, shortness of breath, or weakness. Denies any side effects from medication and is content with current medication.   Patient we will get the occasional tremors, will try Topamax twice daily for this.  Failed drug screen for benzodiazepine  Relevant past medical, surgical, family and social history reviewed and updated as indicated. Interim medical history since our last visit reviewed. Allergies and medications reviewed and updated.  Review of Systems  Constitutional: Negative for chills and fever.  Eyes: Negative for visual disturbance.  Respiratory: Negative for shortness of breath and wheezing.   Cardiovascular: Negative for chest pain and leg swelling.  Musculoskeletal: Negative for back pain and gait problem.  Skin: Negative for rash.    Neurological: Positive for tremors. Negative for light-headedness and numbness.  All other systems reviewed and are negative.   Per HPI unless specifically indicated above   Allergies as of 06/30/2019   No Known Allergies     Medication List       Accurate as of June 30, 2019  1:25 PM. If you have any questions, ask your nurse or doctor.        acetaminophen 325 MG tablet Commonly known as: TYLENOL Take 2 tablets (650 mg total) by mouth every 4 (four) hours as needed for headache or mild pain.   aspirin EC 81 MG tablet Take 81 mg by mouth daily.   atorvastatin 80 MG tablet Commonly known as: LIPITOR Take 1 tablet (80 mg total) by mouth daily.   carvedilol 25 MG tablet Commonly known as: COREG Take 1 tablet (25 mg total) by mouth 2 (two) times daily. Please make overdue appt with Dr. Curt Bears and keep appt before anymore refills. Final Attempt   clopidogrel 75 MG tablet Commonly known as: PLAVIX TAKE 1 TABLET BY MOUTH EVERY DAY IN THE EVENING   DULoxetine 60 MG capsule Commonly known as: CYMBALTA Take 1 capsule (60 mg total) by mouth daily.   ibuprofen 200 MG tablet Commonly known as: ADVIL Take 200 mg by mouth every 6 (six) hours as needed for moderate pain.   Insulin Pen Needle 31G X 8 MM Misc Commonly known as: B-D ULTRAFINE III SHORT PEN USE TO INJECT INSULIN DAILY DX E11.9   lisinopril 2.5 MG tablet Commonly known  as: ZESTRIL Take 1 tablet (2.5 mg total) by mouth daily.   metFORMIN 500 MG tablet Commonly known as: GLUCOPHAGE Take 1 tablet (500 mg total) by mouth 2 (two) times daily.   mirtazapine 7.5 MG tablet Commonly known as: REMERON TAKE 1 TABLET AT BEDTIME FOR DEPRESSION   nitroGLYCERIN 0.4 MG SL tablet Commonly known as: NITROSTAT PLACE 1 TABLET (0.4 MG TOTAL) UNDER THE TONGUE EVERY 5 (FIVE) MINUTES AS NEEDED FOR CHEST PAIN.   OneTouch Verio test strip Generic drug: glucose blood TEST TWICE DAILY   Ozempic (0.25 or 0.5 MG/DOSE) 2  MG/1.5ML Sopn Generic drug: Semaglutide(0.25 or 0.5MG/DOS) Inject 0.5 mg into the skin once a week.   potassium chloride SA 20 MEQ tablet Commonly known as: Klor-Con M20 Take 1 tablet (20 mEq total) by mouth daily.   Praluent 150 MG/ML Soaj Generic drug: Alirocumab Inject 150 mg into the skin every 14 (fourteen) days.   primidone 50 MG tablet Commonly known as: MYSOLINE TAKE 1 TABLET BY MOUTH EVERYDAY AT BEDTIME   tamsulosin 0.4 MG Caps capsule Commonly known as: FLOMAX TAKE 1 CAPSULE DAILY IN THE EVENING   torsemide 20 MG tablet Commonly known as: DEMADEX Take 1 tablet (20 mg total) by mouth daily. (Needs to be seen   Tyler Aas FlexTouch 200 UNIT/ML Sopn Generic drug: Insulin Degludec Inject 116 Units into the skin daily.        Objective:   BP 136/74   Pulse 82   Temp 98.4 F (36.9 C) (Temporal)   Ht 5' 10"  (1.778 m)   Wt 209 lb (94.8 kg)   SpO2 98%   BMI 29.99 kg/m   Wt Readings from Last 3 Encounters:  06/30/19 209 lb (94.8 kg)  03/30/19 227 lb 9.6 oz (103.2 kg)  02/04/19 223 lb (101.2 kg)    Physical Exam Vitals and nursing note reviewed.  Constitutional:      General: He is not in acute distress.    Appearance: He is well-developed. He is not diaphoretic.  Eyes:     General: No scleral icterus.    Conjunctiva/sclera: Conjunctivae normal.  Neck:     Thyroid: No thyromegaly.  Cardiovascular:     Rate and Rhythm: Normal rate and regular rhythm.     Heart sounds: Normal heart sounds. No murmur.  Pulmonary:     Effort: Pulmonary effort is normal. No respiratory distress.     Breath sounds: Normal breath sounds. No wheezing.  Musculoskeletal:     Cervical back: Neck supple.  Lymphadenopathy:     Cervical: No cervical adenopathy.  Skin:    General: Skin is warm and dry.     Findings: No rash.  Neurological:     Mental Status: He is alert and oriented to person, place, and time.     Coordination: Coordination normal.  Psychiatric:         Behavior: Behavior normal.       Assessment & Plan:   Problem List Items Addressed This Visit      Cardiovascular and Mediastinum   Essential hypertension (Chronic)     Endocrine   Diabetes mellitus type 2 in obese ([place]) - Primary   Relevant Medications   glucose blood (ONETOUCH VERIO) test strip   Semaglutide,0.25 or 0.5MG/DOS, (OZEMPIC, 0.25 OR 0.5 MG/DOSE,) 2 MG/1.5ML SOPN   blood glucose meter kit and supplies KIT   Other Relevant Orders   Microalbumin / creatinine urine ratio (Completed)   Bayer DCA Hb A1c Waived (Completed)   CBC  with Differential/Platelet (Completed)   Diabetes mellitus with complication (HCC)   Relevant Medications   glucose blood (ONETOUCH VERIO) test strip   Semaglutide,0.25 or 0.5MG/DOS, (OZEMPIC, 0.25 OR 0.5 MG/DOSE,) 2 MG/1.5ML SOPN   blood glucose meter kit and supplies KIT   Other Relevant Orders   CMP14+EGFR (Completed)     Other   Hyperlipidemia   Relevant Orders   Lipid panel (Completed)      A1c is greater than 14, he stopped taking his insulin because he lost his meter, will give new glucometer and restart his insulin  Patient needs to test 4 times daily, patient is on long-acting insulin once a day Follow up plan: Return in about 3 months (around 09/27/2019), or if symptoms worsen or fail to improve, for DM and HTN.  Counseling provided for all of the vaccine components Orders Placed This Encounter  Procedures  . Microalbumin / creatinine urine ratio  . Bayer [facility] Hb A1c [place], MD [facility] 06/30/2019, 1:25 PM

## 2019-07-01 LAB — CBC WITH DIFFERENTIAL/PLATELET
Basophils Absolute: 0.1 10*3/uL (ref 0.0–0.2)
Basos: 1 %
EOS (ABSOLUTE): 0.2 10*3/uL (ref 0.0–0.4)
Eos: 2 %
Hematocrit: 50.2 % (ref 37.5–51.0)
Hemoglobin: 16.6 g/dL (ref 13.0–17.7)
Immature Grans (Abs): 0 10*3/uL (ref 0.0–0.1)
Immature Granulocytes: 0 %
Lymphocytes Absolute: 2.3 10*3/uL (ref 0.7–3.1)
Lymphs: 32 %
MCH: 28.5 pg (ref 26.6–33.0)
MCHC: 33.1 g/dL (ref 31.5–35.7)
MCV: 86 fL (ref 79–97)
Monocytes Absolute: 0.5 10*3/uL (ref 0.1–0.9)
Monocytes: 7 %
Neutrophils Absolute: 4.3 10*3/uL (ref 1.4–7.0)
Neutrophils: 58 %
Platelets: 164 10*3/uL (ref 150–450)
RBC: 5.82 x10E6/uL — ABNORMAL HIGH (ref 4.14–5.80)
RDW: 13.6 % (ref 11.6–15.4)
WBC: 7.4 10*3/uL (ref 3.4–10.8)

## 2019-07-01 LAB — MICROALBUMIN / CREATININE URINE RATIO
Creatinine, Urine: 41.7 mg/dL
Microalb/Creat Ratio: 191 mg/g creat — ABNORMAL HIGH (ref 0–29)
Microalbumin, Urine: 79.5 ug/mL

## 2019-07-01 LAB — CMP14+EGFR
ALT: 17 IU/L (ref 0–44)
AST: 12 IU/L (ref 0–40)
Albumin/Globulin Ratio: 1.3 (ref 1.2–2.2)
Albumin: 4.3 g/dL (ref 3.8–4.9)
Alkaline Phosphatase: 90 IU/L (ref 39–117)
BUN/Creatinine Ratio: 21 — ABNORMAL HIGH (ref 9–20)
BUN: 24 mg/dL (ref 6–24)
Bilirubin Total: <0.2 mg/dL (ref 0.0–1.2)
CO2: 22 mmol/L (ref 20–29)
Calcium: 8.9 mg/dL (ref 8.7–10.2)
Chloride: 93 mmol/L — ABNORMAL LOW (ref 96–106)
Creatinine, Ser: 1.17 mg/dL (ref 0.76–1.27)
GFR calc Af Amer: 82 mL/min/1.73 (ref >59)
GFR calc non Af Amer: 71 mL/min/1.73 (ref >59)
Globulin, Total: 3.2 g/dL (ref 1.5–4.5)
Glucose: 564 mg/dL (ref 65–99)
Potassium: 5.8 mmol/L — ABNORMAL HIGH (ref 3.5–5.2)
Sodium: 132 mmol/L — ABNORMAL LOW (ref 134–144)
Total Protein: 7.5 g/dL (ref 6.0–8.5)

## 2019-07-01 LAB — LIPID PANEL
Chol/HDL Ratio: 9.2 ratio — ABNORMAL HIGH (ref 0.0–5.0)
Cholesterol, Total: 304 mg/dL — ABNORMAL HIGH (ref 100–199)
HDL: 33 mg/dL — ABNORMAL LOW (ref >39)
Triglycerides: 922 mg/dL (ref 0–149)

## 2019-07-12 ENCOUNTER — Encounter: Payer: Self-pay | Admitting: Cardiovascular Disease

## 2019-07-12 ENCOUNTER — Telehealth (INDEPENDENT_AMBULATORY_CARE_PROVIDER_SITE_OTHER): Payer: BC Managed Care – PPO | Admitting: Cardiovascular Disease

## 2019-07-12 ENCOUNTER — Ambulatory Visit (INDEPENDENT_AMBULATORY_CARE_PROVIDER_SITE_OTHER): Payer: BC Managed Care – PPO | Admitting: *Deleted

## 2019-07-12 DIAGNOSIS — I420 Dilated cardiomyopathy: Secondary | ICD-10-CM

## 2019-07-12 DIAGNOSIS — I5042 Chronic combined systolic (congestive) and diastolic (congestive) heart failure: Secondary | ICD-10-CM

## 2019-07-12 DIAGNOSIS — I10 Essential (primary) hypertension: Secondary | ICD-10-CM

## 2019-07-12 DIAGNOSIS — I25119 Atherosclerotic heart disease of native coronary artery with unspecified angina pectoris: Secondary | ICD-10-CM | POA: Diagnosis not present

## 2019-07-12 DIAGNOSIS — I255 Ischemic cardiomyopathy: Secondary | ICD-10-CM | POA: Diagnosis not present

## 2019-07-12 DIAGNOSIS — Z955 Presence of coronary angioplasty implant and graft: Secondary | ICD-10-CM

## 2019-07-12 DIAGNOSIS — E782 Mixed hyperlipidemia: Secondary | ICD-10-CM

## 2019-07-12 LAB — CUP PACEART REMOTE DEVICE CHECK
Battery Remaining Longevity: 117 mo
Battery Voltage: 3.02 V
Brady Statistic RV Percent Paced: 0 %
Date Time Interrogation Session: 20210309043728
HighPow Impedance: 73 Ohm
Implantable Lead Implant Date: 20180226
Implantable Lead Location: 753860
Implantable Pulse Generator Implant Date: 20180226
Lead Channel Impedance Value: 304 Ohm
Lead Channel Impedance Value: 399 Ohm
Lead Channel Pacing Threshold Amplitude: 0.875 V
Lead Channel Pacing Threshold Pulse Width: 0.4 ms
Lead Channel Sensing Intrinsic Amplitude: 16.25 mV
Lead Channel Sensing Intrinsic Amplitude: 16.25 mV
Lead Channel Setting Pacing Amplitude: 2.5 V
Lead Channel Setting Pacing Pulse Width: 0.4 ms
Lead Channel Setting Sensing Sensitivity: 0.3 mV

## 2019-07-12 NOTE — Addendum Note (Signed)
Addended by: Reola Mosher on: 07/12/2019 03:42 PM   Modules accepted: Orders

## 2019-07-12 NOTE — Progress Notes (Signed)
Virtual Visit via Telephone Note   This visit type was conducted due to national recommendations for restrictions regarding the COVID-19 Pandemic (e.g. social distancing) in an effort to limit this patient's exposure and mitigate transmission in our community.  Due to his co-morbid illnesses, this patient is at least at moderate risk for complications without adequate follow up.  This format is felt to be most appropriate for this patient at this time.  The patient did not have access to video technology/had technical difficulties with video requiring transitioning to audio format only (telephone).  All issues noted in this document were discussed and addressed.  No physical exam could be performed with this format.  Please refer to the patient's chart for his  consent to telehealth for [facility].   The patient was identified using 2 identifiers.  Date:  07/12/2019   ID:  Douglas Carr, DOB 12/14/1965, MRN [id number]  Patient Location: Home Provider Location: Home  PCP:  [name], [name], MD  Cardiologist: Dr. Nanetta Batty Electrophysiologist:  None   Evaluation Performed:  Follow-Up Visit  Chief Complaint: Follow-up ischemic cardiomyopathy, CAD, hypertension  History of Present Illness:    Douglas Carr is a 54 y.o. mildly overweight married Caucasian male father of 3, and father of one grandchild who I last saw in the office 03/31/2018.  He did have a myocardial infarction at age 59 and underwent cardiac catheterization by Dr. Alanda Amass revealing occluded vessel with collaterals. Medical therapy was recommended. I apparently cathed him in 2010 without significant findings. The symptoms include treated hypertension, hyperlipidemia and diabetes. He worked as a Merchandiser, retail for [employer]. Hedid have a remote right carotid endarterectomy performed approximately 10 years ago. Carotid Doppler performed recently on 06/09/16 showed acute thrombus of the origin of  the right common carotid artery throughout the visualized portion of the ICA.  [name] had a STEMI on 05/07/16 and had a Promus struggling stent placed by Dr. [place] in the second obtuse marginal branch. He was discharged home 4 days later and readmitted the following day with volume overload. EF was 30%. He was diuresed and sent home after that. He was readmitted on 05/24/16 with a witnessed cardiac arrest and bystander CPR performed by history 97 year old son. He had fairly rapid R OSC and underwent emergency cardiac catheterization by Dr. Herbie Baltimore revealing a 90% stenosis at the origin of the previous stented vessel with 70% diffuse proximal AV groove circumflex stenosis which was stented by Dr. Herbie Baltimore. He had a prolonged hospital course with intubation sedation and systemic hypothermia. He was seen by palliative care. Ultimately he regained consciousness and neurologic function. He has been in rehabilitation since and is alert, oriented, conversant and ambulatory. He underwent ICD implantation by Dr.Camnitzon 06/30/16 for secondary prevention.  Since I saw him in the office year and a half ago he remained stable.  He denies chest pain but does complain of some dyspnea on exertion.   He follows quarterly with [facility] but has not seen Dr. Elberta Fortis back in several years.  He spends noncompliant with his lipid-lowering medications.  His recent lipid profile performed 06/30/2019 revealed a total cholesterol 304 with a triglyceride level of 922.  His last carotid Doppler study performed 04/20/2018 revealed a total right carotid with moderate left ICA stenosis.  The patient does not have symptoms concerning for COVID-19 infection (fever, chills, cough, or new shortness of breath).    Past Medical History:  Diagnosis Date  . Acute ST elevation  myocardial infarction (STEMI) involving left circumflex coronary artery ([place]) 05/07/2016   PCI to Cx-OM  . Anginal pain ([place])    secondary to sm. vessel disease  .  Anxiety   . Arthritis    BIL KNEE PAIN AND BIL ANKLE PAIN  . Bone spur of ankle   . Cardiac arrest ([place]) 05/24/2016   with v fib  . 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 Coronary artery disease involving native coronary artery of native heart with angina pectoris ([place]) 05/24/2016   Remote MI at 54 years of age, Last cath 2010-diffuse non-obstructive disease; last echo 06/16/08 -normal LV function, moderate concentric hypertrophy; nuc 08/2008 no ischemia;  medical therapy; STEMI May 07 2016 - PCI to Cx-OM  . Diabetes mellitus    ON ORAL MEDICATION AND INSULIN  . Dilated cardiomyopathy ([place]) 05/24/2016   EF 325-30% by Echo post STEMI (previously 30-35%)   . Hyperlipidemia   . Hypertension   . Myocardial infarction ([place]) 1996   Post MI  . Peripheral vascular disease ([place])    HAS LEFT CAROTID ARTERY STENOSIS   AND IS S/P RIGHT CAROTID ENDARTERECTOMY 2010 Last carotid dopplers 01/08/2012 wth patent endarterectomy site  . Status post coronary artery stent placement    Past Surgical History:  Procedure Laterality Date  . APPENDECTOMY    . CARDIAC CATHETERIZATION     FEB 2010, significant branch vessel disease wth diag, marginal, PDA & PLA, nml. LV function  . CARDIAC CATHETERIZATION N/A 05/07/2016   Procedure: Left Heart Cath and Coronary Angiography;  Surgeon: Peter M Martinique, MD;  Location: [facility];  Service: Cardiovascular;  Laterality: N/A;  . CARDIAC CATHETERIZATION N/A 05/07/2016   Procedure: Coronary Stent Intervention;  Surgeon: Peter M Martinique, MD;  Location: [facility];  Service: Cardiovascular;  Laterality: N/A;  . CARDIAC CATHETERIZATION N/A 05/24/2016   Procedure: Left Heart Cath and Coronary Angiography;  Surgeon: [name], MD;  Location: [facility];  Service: Cardiovascular;  Laterality: N/A;  . CARDIAC CATHETERIZATION N/A 05/24/2016   Procedure: Coronary Stent Intervention;  Surgeon: [name],  MD;  Location: [facility];  Service: Cardiovascular;  Laterality: N/A;  2.5x20 Promus to Ostial/proximal circumflex  . CAROTID ENDARTERECTOMY  09/2008   Rt CEA  . ICD IMPLANT N/A 06/30/2016   Procedure: ICD Implant;  Surgeon: Will Meredith Leeds, MD;  Location: [facility];  Service: Cardiovascular;  Laterality: N/A;  . LUNG BIOPSY  2013   Bx's suggest granulomatous dz.  . RT ANKLE   2013     No outpatient medications have been marked as taking for the 07/12/19 encounter (Appointment) with Lorretta Harp, MD.     Allergies:   Patient has no known allergies.   Social History   Tobacco Use  . Smoking status: Former Smoker    Packs/day: 2.00    Years: 22.00    Pack years: 44.00    Types: Cigarettes    Quit date: 10/23/2011    Years since quitting: 7.7  . Smokeless tobacco: Current User    Types: Snuff  Substance Use Topics  . Alcohol use: No  . Drug use: No     Family Hx: The patient's family history includes Diabetes in his mother; Heart attack in his paternal grandfather; Heart attack (age of onset: 57) in his father; Hypertension in his maternal grandfather and mother.  ROS:   Please see  the history of present illness.     All other systems reviewed and are negative.   Prior CV studies:   The following studies were reviewed today:  Carotid Doppler studies performed 04/20/2018  Labs/Other Tests and Data Reviewed:    EKG:  No ECG reviewed.  Recent Labs: 06/30/2019: ALT 17; BUN 24; Creatinine, Ser 1.17; Hemoglobin 16.6; Platelets 164; Potassium 5.8; Sodium 132   Recent Lipid Panel Lab Results  Component Value Date/Time   CHOL 304 (H) 06/30/2019 01:48 PM   TRIG 922 (HH) 06/30/2019 01:48 PM   HDL 33 (L) 06/30/2019 01:48 PM   CHOLHDL 9.2 (H) 06/30/2019 01:48 PM   CHOLHDL 4.7 05/24/2016 03:37 PM   LDLCALC Comment (A) 06/30/2019 01:48 PM    Wt Readings from Last 3 Encounters:  06/30/19 209 lb (94.8 kg)  03/30/19 227 lb 9.6 oz (103.2 kg)  02/04/19  223 lb (101.2 kg)     Objective:    Vital Signs:  There were no vitals taken for this visit.   VITAL SIGNS:  reviewed a complete physical exam was not performed today since this was a virtual telemedicine phone visit  ASSESSMENT & PLAN:    1. Essential hypertension-history of essential hypertension on carvedilol and lisinopril.  He was unable to check his blood pressure today at home. 2. Hyperlipidemia-history of hyperlipidemia currently not on lipid-lowering drugs potentially because of medication noncompliance.  His most recent lipid profile performed 06/30/2019 revealed a total cholesterol 304 with a triglyceride level of 922.  I am going to send him to Dr. [facility] lipid clinic for further evaluation and treatment 3. Coronary artery disease-history of CAD status post STEMI 05/07/2016 treated with drug-eluting stenting to an obtuse marginal branch.  4 days later after his discharge he was readmitted with sudden cardiac death, resuscitated by his 34 year old son and urgently catheterized by Dr. Herbie Baltimore revealing a 90% stenosis within the previously stented segment which was reintervened on.  He says that he is still on aspirin and Plavix 4. Ischemic cardiomyopathy-EF of 35 to 40% by 2D echo 04/13/2018 status post ICD implant by Dr. Elberta Fortis.  He is on carvedilol and lisinopril.  COVID-19 Education: The signs and symptoms of COVID-19 were discussed with the patient and how to seek care for testing (follow up with PCP or arrange E-visit).  The importance of social distancing was discussed today.  Time:   Today, I have spent 9 minutes with the patient with telehealth technology discussing the above problems.     Medication Adjustments/Labs and Tests Ordered: Current medicines are reviewed at length with the patient today.  Concerns regarding medicines are outlined above.   Tests Ordered: No orders of the defined types were placed in this encounter.   Medication Changes: No orders of the  defined types were placed in this encounter.   Follow Up:  In Person in 6 month(s)  Signed, Nanetta Batty, MD  07/12/2019 2:42 PM    [facility]

## 2019-07-12 NOTE — Patient Instructions (Signed)
Medication Instructions:  Your physician recommends that you continue on your current medications as directed. Please refer to the Current Medication list given to you today.  If you need a refill on your cardiac medications before your next appointment, please call your pharmacy.   Lab work: NONE  Testing/Procedures: Your physician has requested that you have a carotid duplex. This test is an ultrasound of the carotid arteries in your neck. It looks at blood flow through these arteries that supply the brain with blood. Allow one hour for this exam. There are no restrictions or special instructions.  Follow-Up: At [facility], you and your health needs are our priority.  As part of our continuing mission to provide you with exceptional heart care, we have created designated Provider Care Teams.  These Care Teams include your primary Cardiologist (physician) and Advanced Practice Providers (APPs -  Physician Assistants and Nurse Practitioners) who all work together to provide you with the care you need, when you need it. You may see Dr. Allyson Sabal or one of the following Advanced Practice Providers on your designated Care Team:    Corine Shelter, PA-C  [place], [place]  Edd Fabian, [place]  Your physician wants you to follow-up in: 6 months with Dr. Allyson Sabal. You will receive a reminder letter in the mail two months in advance. If you don't receive a letter, please call our office to schedule the follow-up appointment.   Any Other Special Instructions Will Be Listed Below (If Applicable). You have been referred to Dr[facility]. They will be in contact.

## 2019-07-13 ENCOUNTER — Other Ambulatory Visit: Payer: Self-pay | Admitting: Cardiovascular Disease

## 2019-07-13 DIAGNOSIS — I6521 Occlusion and stenosis of right carotid artery: Secondary | ICD-10-CM

## 2019-07-13 NOTE — Progress Notes (Signed)
ICD Remote  

## 2019-07-20 ENCOUNTER — Ambulatory Visit (HOSPITAL_COMMUNITY)
Admission: RE | Admit: 2019-07-20 | Discharge: 2019-07-20 | Disposition: A | Discharge status: Home / Self Care | Payer: Medicare Other | Source: Ambulatory Visit | Attending: Cardiovascular Disease | Admitting: Cardiovascular Disease

## 2019-07-20 ENCOUNTER — Other Ambulatory Visit: Payer: Self-pay

## 2019-07-20 DIAGNOSIS — I6521 Occlusion and stenosis of right carotid artery: Secondary | ICD-10-CM | POA: Diagnosis not present

## 2019-07-22 ENCOUNTER — Other Ambulatory Visit (HOSPITAL_COMMUNITY): Payer: Self-pay | Admitting: Cardiovascular Disease

## 2019-07-22 ENCOUNTER — Other Ambulatory Visit: Payer: Self-pay | Admitting: Neurology

## 2019-07-22 DIAGNOSIS — I6523 Occlusion and stenosis of bilateral carotid arteries: Secondary | ICD-10-CM

## 2019-07-25 NOTE — Telephone Encounter (Signed)
Rx for Primidone denied per Dr Don Perking last office note pt is to get medication refilled by pcp.

## 2019-07-26 ENCOUNTER — Telehealth (INDEPENDENT_AMBULATORY_CARE_PROVIDER_SITE_OTHER): Payer: BC Managed Care – PPO | Admitting: Internal Medicine

## 2019-07-26 ENCOUNTER — Encounter: Payer: Self-pay | Admitting: Internal Medicine

## 2019-07-26 VITALS — BP 141/80 | HR 87 | Ht 70.0 in | Wt 203.0 lb

## 2019-07-26 DIAGNOSIS — I255 Ischemic cardiomyopathy: Secondary | ICD-10-CM | POA: Diagnosis not present

## 2019-07-26 DIAGNOSIS — E782 Mixed hyperlipidemia: Secondary | ICD-10-CM | POA: Diagnosis not present

## 2019-07-26 DIAGNOSIS — R419 Unspecified symptoms and signs involving cognitive functions and awareness: Secondary | ICD-10-CM

## 2019-07-26 DIAGNOSIS — I25119 Atherosclerotic heart disease of native coronary artery with unspecified angina pectoris: Secondary | ICD-10-CM

## 2019-07-26 DIAGNOSIS — Z8674 Personal history of sudden cardiac arrest: Secondary | ICD-10-CM

## 2019-07-26 DIAGNOSIS — E118 Type 2 diabetes mellitus with unspecified complications: Secondary | ICD-10-CM

## 2019-07-26 DIAGNOSIS — I5022 Chronic systolic (congestive) heart failure: Secondary | ICD-10-CM

## 2019-07-26 DIAGNOSIS — Z794 Long term (current) use of insulin: Secondary | ICD-10-CM

## 2019-07-26 DIAGNOSIS — Z9114 Patient's other noncompliance with medication regimen: Secondary | ICD-10-CM

## 2019-07-26 DIAGNOSIS — Z79899 Other long term (current) drug therapy: Secondary | ICD-10-CM

## 2019-07-26 DIAGNOSIS — I2510 Atherosclerotic heart disease of native coronary artery without angina pectoris: Secondary | ICD-10-CM

## 2019-07-26 NOTE — Progress Notes (Signed)
Virtual Visit via Video Note   This visit type was conducted due to national recommendations for restrictions regarding the COVID-19 Pandemic (e.g. social distancing) in an effort to limit this patient's exposure and mitigate transmission in our community.  Due to his co-morbid illnesses, this patient is at least at moderate risk for complications without adequate follow up.  This format is felt to be most appropriate for this patient at this time.  All issues noted in this document were discussed and addressed.  A limited physical exam was performed with this format.  Please refer to the patient's chart for his consent to telehealth for [facility].   Evaluation Performed:  Doxy. Me Video visit  Date:  07/26/2019   ID:  Douglas Carr, DOB November 05, 1965, MRN [id number]  Patient Location:  [place] [address]  Provider location:   [address], [place], [place] [address]  PCP:  [name], Fransisca Kaufmann, MD  Cardiologist:  No primary care provider on file. Electrophysiologist:  None   Chief Complaint:  Manage dyslipidemia  History of Present Illness:    Douglas Carr is a 54 y.o. male who presents via audio/video conferencing for a telehealth visit today.  Mr. Harnois is a 54 year old male kindly referred by Dr. Alvester Chou for evaluation and management of dyslipidemia.  He has a history of ischemic cardiomyopathy with longstanding coronary disease and insulin-dependent diabetes.  Unfortunate has been poor compliance.  A lot of this may have stemmed from a cardiac arrest he suffered I believe in 2018 and he does seem to have some cognitive dysfunction.  He says his wife helps him a lot with his medications.  Of note over the past several years his lipids have increased significantly.  This seems to correlate with significant worsening in his diabetes.  Hemoglobin A1c several years ago was in the low 6 range but went up to over 14 and has gradually come down to  about 8.  His triglycerides more recently have risen from the 90s to 120 is up to 922 based on his labs 6 weeks ago.  He is readily reporting medication noncompliance.  Total cholesterol was 304.  He is supposed to be on high-dose atorvastatin and was approved for Praluent based on some elevated lipids about a year ago.  He is worked with our pharmacists in the lipid clinic and was approved for Praluent 150 mg every 2 weeks.  He does not report compliance with this.   The patient does not have symptoms concerning for COVID-19 infection (fever, chills, cough, or new SHORTNESS OF BREATH).    Prior CV studies:   The following studies were reviewed today:  Chart reviewed Lab work  PMHx:  Past Medical History:  Diagnosis Date  . Acute ST elevation myocardial infarction (STEMI) involving left circumflex coronary artery ([place]) 05/07/2016   PCI to Cx-OM  . Anginal pain ([place])    secondary to sm. vessel disease  . Anxiety   . Arthritis    BIL KNEE PAIN AND BIL ANKLE PAIN  . Bone spur of ankle   . Cardiac arrest ([place]) 05/24/2016   with v fib  . 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 Coronary artery disease involving native coronary artery of native heart with angina pectoris ([place]) 05/24/2016   Remote MI at 54 years of age, Last cath 2010-diffuse non-obstructive disease; last echo 06/16/08 -normal LV function, moderate concentric hypertrophy; nuc  08/2008 no ischemia;  medical therapy; STEMI May 07 2016 - PCI to Cx-OM  . Diabetes mellitus    ON ORAL MEDICATION AND INSULIN  . Dilated cardiomyopathy ([place]) 05/24/2016   EF 325-30% by Echo post STEMI (previously 30-35%)   . Hyperlipidemia   . Hypertension   . Myocardial infarction ([place]) 1996   Post MI  . Peripheral vascular disease ([place])    HAS LEFT CAROTID ARTERY STENOSIS   AND IS S/P RIGHT CAROTID ENDARTERECTOMY 2010 Last carotid dopplers 01/08/2012 wth patent endarterectomy site  . Status post  coronary artery stent placement     Past Surgical History:  Procedure Laterality Date  . APPENDECTOMY    . CARDIAC CATHETERIZATION     FEB 2010, significant branch vessel disease wth diag, marginal, PDA & PLA, nml. LV function  . CARDIAC CATHETERIZATION N/A 05/07/2016   Procedure: Left Heart Cath and Coronary Angiography;  Surgeon: Peter M Martinique, MD;  Location: [place] [facility];  Service: Cardiovascular;  Laterality: N/A;  . CARDIAC CATHETERIZATION N/A 05/07/2016   Procedure: Coronary Stent Intervention;  Surgeon: Peter M Martinique, MD;  Location: [place] [facility];  Service: Cardiovascular;  Laterality: N/A;  . CARDIAC CATHETERIZATION N/A 05/24/2016   Procedure: Left Heart Cath and Coronary Angiography;  Surgeon: [name], MD;  Location: [place] [facility];  Service: Cardiovascular;  Laterality: N/A;  . CARDIAC CATHETERIZATION N/A 05/24/2016   Procedure: Coronary Stent Intervention;  Surgeon: [name], MD;  Location: [place] [facility];  Service: Cardiovascular;  Laterality: N/A;  2.5x20 Promus to Ostial/proximal circumflex  . CAROTID ENDARTERECTOMY  09/2008   Rt CEA  . ICD IMPLANT N/A 06/30/2016   Procedure: ICD Implant;  Surgeon: Will Meredith Leeds, MD;  Location: [place] [facility];  Service: Cardiovascular;  Laterality: N/A;  . LUNG BIOPSY  2013   Bx's suggest granulomatous dz.  . RT ANKLE   2013    FAMHx:  Family History  Problem Relation Age of Onset  . Hypertension Mother   . Diabetes Mother   . Hypertension Maternal Grandfather   . Heart attack Paternal Grandfather   . Heart attack Father 71    SOCHx:   reports that he quit smoking about 7 years ago. His smoking use included cigarettes. He has a 44.00 pack-year smoking history. His smokeless tobacco use includes snuff. He reports that he does not drink alcohol or use drugs.  ALLERGIES:  No Known Allergies  MEDS:  Current Meds  Medication Sig  . acetaminophen (TYLENOL) 325 MG tablet Take 2 tablets (650 mg  total) by mouth every 4 (four) hours as needed for headache or mild pain.  . Alirocumab (PRALUENT) 150 MG/ML SOAJ Inject 150 mg into the skin every 14 (fourteen) days.  Marland Kitchen aspirin EC 81 MG tablet Take 81 mg by mouth daily.  Marland Kitchen atorvastatin (LIPITOR) 80 MG tablet Take 1 tablet (80 mg total) by mouth daily.  . blood glucose meter kit and supplies KIT Inject 1 each into the skin 4 (four) times daily as needed. Dispense based on patient and insurance preference. Use up to four times daily as directed. (FOR ICD-9 250.00, 250.01).  . carvedilol (COREG) 25 MG tablet Take 1 tablet (25 mg total) by mouth 2 (two) times daily. Please make overdue appt with Dr. Curt Bears and keep appt before anymore refills. Final Attempt  . clopidogrel (PLAVIX) 75 MG tablet TAKE 1 TABLET BY MOUTH EVERY DAY IN THE EVENING  . DULoxetine (CYMBALTA) 60 MG  capsule Take 1 capsule (60 mg total) by mouth daily.  Marland Kitchen glucose blood (ONETOUCH VERIO) test strip 1 each by Other route in the morning, at noon, in the evening, and at bedtime. use for testing  . ibuprofen (ADVIL,MOTRIN) 200 MG tablet Take 200 mg by mouth every 6 (six) hours as needed for moderate pain.  . Insulin Degludec (TRESIBA FLEXTOUCH) 200 UNIT/ML SOPN Inject 116 Units into the skin daily.  . Insulin Pen Needle (B-D ULTRAFINE III SHORT PEN) 31G X 8 MM MISC USE TO INJECT INSULIN DAILY DX E11.9  . lisinopril (ZESTRIL) 2.5 MG tablet Take 1 tablet (2.5 mg total) by mouth daily.  . metFORMIN (GLUCOPHAGE) 500 MG tablet Take 1 tablet (500 mg total) by mouth 2 (two) times daily.  . mirtazapine (REMERON) 7.5 MG tablet TAKE 1 TABLET AT BEDTIME FOR DEPRESSION  . nitroGLYCERIN (NITROSTAT) 0.4 MG SL tablet PLACE 1 TABLET (0.4 MG TOTAL) UNDER THE TONGUE EVERY 5 (FIVE) MINUTES AS NEEDED FOR CHEST PAIN.  Marland Kitchen potassium chloride SA (KLOR-CON M20) 20 MEQ tablet Take 1 tablet (20 mEq total) by mouth daily.  . primidone (MYSOLINE) 50 MG tablet TAKE 1 TABLET BY MOUTH EVERYDAY AT BEDTIME  .  Semaglutide,0.25 or 0.5MG/DOS, (OZEMPIC, 0.25 OR 0.5 MG/DOSE,) 2 MG/1.5ML SOPN Inject 0.5 mg into the skin once a week.  . tamsulosin (FLOMAX) 0.4 MG CAPS capsule TAKE 1 CAPSULE DAILY IN THE EVENING  . topiramate (TOPAMAX) 25 MG tablet Take 1 tablet (25 mg total) by mouth 2 (two) times daily.  Marland Kitchen torsemide (DEMADEX) 20 MG tablet Take 1 tablet (20 mg total) by mouth daily. (Needs to be seen     ROS: Pertinent items noted in HPI and remainder of comprehensive ROS otherwise negative.  Labs/Other Tests and Data Reviewed:    Recent Labs: 06/30/2019: ALT 17; BUN 24; Creatinine, Ser 1.17; Hemoglobin 16.6; Platelets 164; Potassium 5.8; Sodium 132   Recent Lipid Panel Lab Results  Component Value Date/Time   CHOL 304 (H) 06/30/2019 01:48 PM   TRIG 922 (HH) 06/30/2019 01:48 PM   HDL 33 (L) 06/30/2019 01:48 PM   CHOLHDL 9.2 (H) 06/30/2019 01:48 PM   CHOLHDL 4.7 05/24/2016 03:37 PM   LDLCALC Comment (A) 06/30/2019 01:48 PM    Wt Readings from Last 3 Encounters:  07/26/19 203 lb (92.1 kg)  07/12/19 211 lb (95.7 kg)  06/30/19 209 lb (94.8 kg)     Exam:    Vital Signs:  BP (!) 141/80   Pulse 87   Ht 5' 10"  (1.778 m)   Wt 203 lb (92.1 kg)   BMI 29.13 kg/m    General appearance: alert, no distress and slowed mentation Lungs: No visual respiratory difficulty Abdomen: Obese Extremities: extremities normal, atraumatic, no cyanosis or edema Skin: Skin color, texture, turgor normal. No rashes or lesions Neurologic: Mental status: Alert, oriented, thought content appropriate, Slowed mentation, tremor Psych: Slow mentation/speech  ASSESSMENT & PLAN:    1. Mixed dyslipidemia, goal LDL less than 70 2. Uncontrolled insulin-dependent diabetes 3. Ischemic cardiomyopathy 4. Coronary artery disease with prior cardiac arrest  Mr. Scheel has a mixed dyslipidemia with predominantly elevated triglycerides which I think is combination of dietary, poorly controlled diabetes and medication  noncompliance.  His triglycerides were much better controlled a couple years ago and then he has had significant difficulty with blood sugar control after that.  That was a time when he suffered cardiac arrest.  He was approved for Praluent but has been noncompliant with that and  a statin.  I highly encouraged him to start the statin and the Praluent back again.  We will reach out to our pharmacist to make sure he has an active prescription and I encouraged him to apply for the health well foundation.  If we do not see significant improvement in his lipids after compliance with medication in 3 to 4 months, then we could consider adding additional medications as necessary to reach his targets.  Overall though he will need to be more responsible for his health.  Thanks again for the kind referral.  COVID-19 Education: The signs and symptoms of COVID-19 were discussed with the patient and how to seek care for testing (follow up with PCP or arrange E-visit).  The importance of social distancing was discussed today.  Patient Risk:   After full review of this patients clinical status, I feel that they are at least moderate risk at this time.  Time:   Today, I have spent 25 minutes with the patient with telehealth technology discussing dyslipidemia, type 2 diabetes, dietary modification, medication compliance.     Medication Adjustments/Labs and Tests Ordered: Current medicines are reviewed at length with the patient today.  Concerns regarding medicines are outlined above.   Tests Ordered: No orders of the defined types were placed in this encounter.   Medication Changes: No orders of the defined types were placed in this encounter.   Disposition:  in 4 month(s)  Pixie Casino, MD, [facility], [place] Director of the Advanced Lipid Disorders &  Cardiovascular Risk Reduction Clinic Diplomate of the American Board of Clinical Lipidology Attending Cardiologist    Direct Dial: [phone]  Fax: [phone]  Website:  [email or web address]  Pixie Casino, MD  07/26/2019 8:44 AM

## 2019-07-26 NOTE — Patient Instructions (Signed)
Medication Instructions:  Your Physician recommend you continue on your current medication as directed.    *If you need a refill on your cardiac medications before your next appointment, please call your pharmacy*   Lab Work: Your physician recommends that you return for lab work in 4 months ( Lipid, direct LDL, A1C).  If you have labs (blood work) drawn today and your tests are completely normal, you will receive your results only by: Marland Kitchen MyChart Message (if you have MyChart) OR . A paper copy in the mail If you have any lab test that is abnormal or we need to change your treatment, we will call you to review the results.   Testing/Procedures: None   Follow-Up: At [facility], you and your health needs are our priority.  As part of our continuing mission to provide you with exceptional heart care, we have created designated Provider Care Teams.  These Care Teams include your primary Cardiologist (physician) and Advanced Practice Providers (APPs -  Physician Assistants and Nurse Practitioners) who all work together to provide you with the care you need, when you need it.  We recommend signing up for the patient portal called "MyChart".  Sign up information is provided on this After Visit Summary.  MyChart is used to connect with patients for Virtual Visits (Telemedicine).  Patients are able to view lab/test results, encounter notes, upcoming appointments, etc.  Non-urgent messages can be sent to your provider as well.   To learn more about what you can do with MyChart, go to [email or web address].    Your next appointment:   4 month(s)  The format for your next appointment:   In Person  Provider:   K. Italy Hilty, MD

## 2019-07-27 ENCOUNTER — Telehealth: Payer: Self-pay | Admitting: Internal Medicine

## 2019-07-27 NOTE — Telephone Encounter (Signed)
-----   Message from [place], [place] sent at 07/27/2019  9:14 AM EDT ----- Regarding: RE: praluent 05/16/20 end date, and no he hasn't applied for healthwell. But all of our patients are instructed to call us back if the med is unaffordable to I can help them sign up while they are in office ----- Message ----- From: Lindell Spar, RN Sent: 07/27/2019   8:31 AM EDT To: Eather Colas, CMA Subject: FW: praluent                                   Hey Haleigh -   Do you have a date for his Praluent approval? Do you know if he applied for HealthWell per your note?  Thanks! ----- Message ----- From: Parke Poisson, RN Sent: 07/26/2019   9:13 AM EDT To: Lindell Spar, RN, Eather Colas, CMA Subject: praluent                                       Good morning,  You previously got Mr. Hue approved for puralent but he has been noncompliant. Dr. Rennis Golden is inquiring if order is still active.

## 2019-07-28 ENCOUNTER — Ambulatory Visit: Payer: BC Managed Care – PPO | Attending: Internal Medicine

## 2019-07-28 DIAGNOSIS — Z23 Encounter for immunization: Secondary | ICD-10-CM

## 2019-07-28 NOTE — Progress Notes (Signed)
   Covid-19 Vaccination Clinic  Name:  Douglas Carr    MRN: [id number] DOB: 05/14/1965  07/28/2019  [name] was observed post Covid-19 immunization for 15 minutes without incident. He was provided with Vaccine Information Sheet and instruction to access the V-Safe system.   [name] was instructed to call 911 with any severe reactions post vaccine: Marland Kitchen Difficulty breathing  . Swelling of face and throat  . A fast heartbeat  . A bad rash all over body  . Dizziness and weakness   Immunizations Administered    Name Date Dose VIS Date Route   Moderna COVID-19 Vaccine 07/28/2019 12:39 PM 0.5 mL 04/05/2019 Intramuscular   Manufacturer: Moderna   Lot: [id number]   NDC: 72897-915-04

## 2019-08-25 ENCOUNTER — Ambulatory Visit: Payer: BC Managed Care – PPO | Attending: Internal Medicine

## 2019-08-25 DIAGNOSIS — Z23 Encounter for immunization: Secondary | ICD-10-CM

## 2019-08-25 NOTE — Progress Notes (Signed)
   Covid-19 Vaccination Clinic  Name:  PHINEAS MCENROE    MRN: [id number] DOB: 09/09/1965  08/25/2019  Mr. Beckner was observed post Covid-19 immunization for 15 minutes without incident. He was provided with Vaccine Information Sheet and instruction to access the V-Safe system.   Mr. Schleicher was instructed to call 911 with any severe reactions post vaccine: Marland Kitchen Difficulty breathing  . Swelling of face and throat  . A fast heartbeat  . A bad rash all over body  . Dizziness and weakness   Immunizations Administered    Name Date Dose VIS Date Route   Moderna COVID-19 Vaccine 08/25/2019 12:28 PM 0.5 mL 04/2019 Intramuscular   Manufacturer: Gala Murdoch   Lot: [id number]   NDC: 51700-174-94

## 2019-09-27 ENCOUNTER — Other Ambulatory Visit: Payer: Self-pay

## 2019-09-27 ENCOUNTER — Ambulatory Visit (INDEPENDENT_AMBULATORY_CARE_PROVIDER_SITE_OTHER): Payer: Medicare Other | Admitting: Family Medicine

## 2019-09-27 ENCOUNTER — Encounter: Payer: Self-pay | Admitting: Family Medicine

## 2019-09-27 VITALS — BP 108/62 | HR 95 | Temp 97.9°F | Ht 70.0 in | Wt 198.2 lb

## 2019-09-27 DIAGNOSIS — F329 Major depressive disorder, single episode, unspecified: Secondary | ICD-10-CM

## 2019-09-27 DIAGNOSIS — I255 Ischemic cardiomyopathy: Secondary | ICD-10-CM

## 2019-09-27 DIAGNOSIS — E118 Type 2 diabetes mellitus with unspecified complications: Secondary | ICD-10-CM

## 2019-09-27 DIAGNOSIS — F32A Depression, unspecified: Secondary | ICD-10-CM

## 2019-09-27 DIAGNOSIS — F419 Anxiety disorder, unspecified: Secondary | ICD-10-CM

## 2019-09-27 DIAGNOSIS — E119 Type 2 diabetes mellitus without complications: Secondary | ICD-10-CM

## 2019-09-27 DIAGNOSIS — E1169 Type 2 diabetes mellitus with other specified complication: Secondary | ICD-10-CM

## 2019-09-27 DIAGNOSIS — E669 Obesity, unspecified: Secondary | ICD-10-CM

## 2019-09-27 LAB — BAYER DCA HB A1C WAIVED: HB A1C (BAYER DCA - WAIVED): >14 % — ABNORMAL HIGH (ref <7.0)

## 2019-09-27 MED ORDER — TRESIBA FLEXTOUCH 200 UNIT/ML ~~LOC~~ SOPN: 115.0000 [IU] | PEN_INJECTOR | Freq: Every day | SUBCUTANEOUS | 5 refills | Status: DC

## 2019-09-27 MED ORDER — DULOXETINE HCL 60 MG PO CPEP: 60.0000 mg | ORAL_CAPSULE | Freq: Every day | ORAL | 3 refills | Status: DC

## 2019-09-27 MED ORDER — OZEMPIC (0.25 OR 0.5 MG/DOSE) 2 MG/1.5ML ~~LOC~~ SOPN: 0.5000 mg | PEN_INJECTOR | SUBCUTANEOUS | 3 refills | Status: DC

## 2019-09-27 MED ORDER — TOPIRAMATE 25 MG PO TABS: 25.0000 mg | ORAL_TABLET | Freq: Every evening | ORAL | 3 refills | Status: DC | PRN

## 2019-09-27 NOTE — Progress Notes (Signed)
BP 108/62   Pulse 95   Temp 97.9 F (36.6 C) (Temporal)   Ht 5' 10"  (1.778 m)   Wt 198 lb 4 oz (89.9 kg)   BMI 28.45 kg/m    Subjective:   Patient ID: Douglas Carr, male    DOB: [birth year], 54 y.o.   MRN: [id number]  HPI: Douglas Carr is a 54 y.o. male presenting on 09/27/2019 for Medical Management of Chronic Issues   HPI Depression anxiety Patient is coming in to discuss depression and anxiety and tremors.  He is currently on duloxetine but never tried the topiramate.  He used to be on alprazolam but failed a drug screen.  He says his depression has been very bad to the point where he is not taking his diabetes medications and not taking care of himself.  He denies any suicidal ideations but just feels down.  He has also come to realization recently that it has been a year since his stroke and that now he is effects are more permanent Depression screen [facility] 2/9 09/27/2019 06/30/2019 03/30/2019 07/01/2018 01/14/2018  Decreased Interest 0 0 0 3 0  Down, Depressed, Hopeless 0 1 0 3 0  PHQ - 2 Score 0 1 0 6 0  Altered sleeping 0 - - 2 -  Tired, decreased energy 0 - - 3 -  Change in appetite 0 - - 2 -  Feeling bad or failure about yourself  0 - - 3 -  Trouble concentrating 0 - - 2 -  Moving slowly or fidgety/restless 0 - - 3 -  Suicidal thoughts 0 - - 1 -  PHQ-9 Score 0 - - 22 -  Difficult doing work/chores Not difficult at all - - - -  Some recent data might be hidden    Type 2 diabetes mellitus Patient comes in today for recheck of his diabetes. Patient has been currently taking Metformin, stopped his [place] and his Ozempic, he cannot explain why except for that he is feeling very depressed.. Patient is currently on an ACE inhibitor/ARB. Patient has not seen an ophthalmologist this year. Patient denies any issues with their feet. The symptom started onset as an adult hyperlipidemia and history of stroke ARE RELATED TO DM   Hyperlipidemia Patient is coming in for  recheck of his hyperlipidemia. The patient is currently taking atorvastatin. They deny any issues with myalgias or history of liver damage from it. They deny any focal numbness or weakness or chest pain.   Relevant past medical, surgical, family and social history reviewed and updated as indicated. Interim medical history since our last visit reviewed. Allergies and medications reviewed and updated.  Review of Systems  Constitutional: Negative for chills and fever.  Respiratory: Negative for shortness of breath and wheezing.   Cardiovascular: Negative for chest pain and leg swelling.  Musculoskeletal: Negative for back pain and gait problem.  Skin: Negative for rash.  Neurological: Negative for dizziness, weakness and light-headedness.  All other systems reviewed and are negative.   Per HPI unless specifically indicated above   Allergies as of 09/27/2019   No Known Allergies     Medication List       Accurate as of Sep 27, 2019  2:44 PM. If you have any questions, ask your nurse or doctor.        STOP taking these medications   acetaminophen 325 MG tablet Commonly known as: TYLENOL Stopped by: Worthy Rancher, MD     TAKE these medications  aspirin EC 81 MG tablet Take 81 mg by mouth daily.   atorvastatin 80 MG tablet Commonly known as: LIPITOR Take 1 tablet (80 mg total) by mouth daily.   blood glucose meter kit and supplies Kit Inject 1 each into the skin 4 (four) times daily as needed. Dispense based on patient and insurance preference. Use up to four times daily as directed. (FOR ICD-9 250.00, 250.01).   carvedilol 25 MG tablet Commonly known as: COREG Take 1 tablet (25 mg total) by mouth 2 (two) times daily. Please make overdue appt with Dr. Curt Bears and keep appt before anymore refills. Final Attempt   clopidogrel 75 MG tablet Commonly known as: PLAVIX TAKE 1 TABLET BY MOUTH EVERY DAY IN THE EVENING   DULoxetine 60 MG capsule Commonly known as:  CYMBALTA Take 1 capsule (60 mg total) by mouth daily.   ibuprofen 200 MG tablet Commonly known as: ADVIL Take 200 mg by mouth every 6 (six) hours as needed for moderate pain.   Insulin Pen Needle 31G X 8 MM Misc Commonly known as: B-D ULTRAFINE III SHORT PEN USE TO INJECT INSULIN DAILY DX E11.9   lisinopril 2.5 MG tablet Commonly known as: ZESTRIL Take 1 tablet (2.5 mg total) by mouth daily.   metFORMIN 500 MG tablet Commonly known as: GLUCOPHAGE Take 1 tablet (500 mg total) by mouth 2 (two) times daily.   mirtazapine 7.5 MG tablet Commonly known as: REMERON TAKE 1 TABLET AT BEDTIME FOR DEPRESSION   nitroGLYCERIN 0.4 MG SL tablet Commonly known as: NITROSTAT PLACE 1 TABLET (0.4 MG TOTAL) UNDER THE TONGUE EVERY 5 (FIVE) MINUTES AS NEEDED FOR CHEST PAIN.   OneTouch Verio test strip Generic drug: glucose blood 1 each by Other route in the morning, at noon, in the evening, and at bedtime. use for testing   Ozempic (0.25 or 0.5 MG/DOSE) 2 MG/1.5ML Sopn Generic drug: Semaglutide(0.25 or 0.5MG/DOS) Inject 0.5 mg into the skin once a week.   potassium chloride SA 20 MEQ tablet Commonly known as: Klor-Con M20 Take 1 tablet (20 mEq total) by mouth daily.   Praluent 150 MG/ML Soaj Generic drug: Alirocumab Inject 150 mg into the skin every 14 (fourteen) days.   primidone 50 MG tablet Commonly known as: MYSOLINE TAKE 1 TABLET BY MOUTH EVERYDAY AT BEDTIME   tamsulosin 0.4 MG Caps capsule Commonly known as: FLOMAX TAKE 1 CAPSULE DAILY IN THE EVENING   topiramate 25 MG tablet Commonly known as: Topamax Take 1 tablet (25 mg total) by mouth at bedtime as needed. What changed:   when to take this  reasons to take this Changed by: Worthy Rancher, MD   torsemide 20 MG tablet Commonly known as: DEMADEX Take 1 tablet (20 mg total) by mouth daily. (Needs to be seen   Tyler Aas FlexTouch 200 UNIT/ML FlexTouch Pen Generic drug: insulin degludec Inject 116 Units into the  skin daily.        Objective:   BP 108/62   Pulse 95   Temp 97.9 F (36.6 C) (Temporal)   Ht 5' 10"  (1.778 m)   Wt 198 lb 4 oz (89.9 kg)   BMI 28.45 kg/m   Wt Readings from Last 3 Encounters:  09/27/19 198 lb 4 oz (89.9 kg)  07/26/19 203 lb (92.1 kg)  07/12/19 211 lb (95.7 kg)    Physical Exam Vitals and nursing note reviewed.  Constitutional:      General: He is not in acute distress.    Appearance: He is  well-developed. He is not diaphoretic.  Eyes:     General: No scleral icterus.       Right eye: No discharge.     Conjunctiva/sclera: Conjunctivae normal.     Pupils: Pupils are equal, round, and reactive to light.  Neck:     Thyroid: No thyromegaly.  Cardiovascular:     Rate and Rhythm: Normal rate and regular rhythm.     Heart sounds: Normal heart sounds. No murmur.  Pulmonary:     Effort: Pulmonary effort is normal. No respiratory distress.     Breath sounds: Normal breath sounds. No wheezing.  Musculoskeletal:        General: Normal range of motion.     Cervical back: Neck supple.  Lymphadenopathy:     Cervical: No cervical adenopathy.  Skin:    General: Skin is warm and dry.     Findings: No rash.  Neurological:     Mental Status: He is alert and oriented to person, place, and time.     Coordination: Coordination normal.     Comments: Speech difficulty and right-sided slight weakness in his upper extremity due to history of stroke  Psychiatric:        Behavior: Behavior normal.       Assessment & Plan:   Problem List Items Addressed This Visit      Endocrine   Diabetes mellitus type 2 in obese ([place]) - Primary   Relevant Medications   Semaglutide,0.25 or 0.5MG/DOS, (OZEMPIC, 0.25 OR 0.5 MG/DOSE,) 2 MG/1.5ML SOPN   insulin degludec (TRESIBA FLEXTOUCH) 200 UNIT/ML FlexTouch Pen   Other Relevant Orders   Bayer DCA Hb A1c Waived   CBC with Differential/Platelet   CMP14+EGFR   Lipid panel   Diabetes mellitus with complication (HCC)   Relevant  Medications   Semaglutide,0.25 or 0.5MG/DOS, (OZEMPIC, 0.25 OR 0.5 MG/DOSE,) 2 MG/1.5ML SOPN   insulin degludec (TRESIBA FLEXTOUCH) 200 UNIT/ML FlexTouch Pen   Other Relevant Orders   CBC with Differential/Platelet   CMP14+EGFR   Lipid panel     Other   Anxiety and depression   Relevant Medications   DULoxetine (CYMBALTA) 60 MG capsule   Other Relevant Orders   CBC with Differential/Platelet   CMP14+EGFR   Lipid panel      A1c greater than 14, will restart [place] and Ozempic.  Patient never took to paramedics, will add to her regiment for tremors and depression Follow up plan: Return in about 4 weeks (around 10/25/2019), or if symptoms worsen or fail to improve, for Depression and anxiety recheck, virtual.  Counseling provided for all of the vaccine components Orders Placed This Encounter  Procedures  . Bayer DCA Hb A1c Waived  . CBC with Differential/Platelet  . CMP14+EGFR  . Lipid panel    Caryl Pina, MD [facility] 09/27/2019, 2:44 PM

## 2019-09-28 LAB — CBC WITH DIFFERENTIAL/PLATELET
Basophils Absolute: 0.1 10*3/uL (ref 0.0–0.2)
Basos: 1 %
EOS (ABSOLUTE): 0.1 10*3/uL (ref 0.0–0.4)
Eos: 2 %
Hematocrit: 49.1 % (ref 37.5–51.0)
Hemoglobin: 15.9 g/dL (ref 13.0–17.7)
Immature Grans (Abs): 0 10*3/uL (ref 0.0–0.1)
Immature Granulocytes: 0 %
Lymphocytes Absolute: 2.8 10*3/uL (ref 0.7–3.1)
Lymphs: 32 %
MCH: 28 pg (ref 26.6–33.0)
MCHC: 32.4 g/dL (ref 31.5–35.7)
MCV: 87 fL (ref 79–97)
Monocytes Absolute: 0.8 10*3/uL (ref 0.1–0.9)
Monocytes: 10 %
Neutrophils Absolute: 4.9 10*3/uL (ref 1.4–7.0)
Neutrophils: 55 %
Platelets: 174 10*3/uL (ref 150–450)
RBC: 5.67 x10E6/uL (ref 4.14–5.80)
RDW: 12.2 % (ref 11.6–15.4)
WBC: 8.7 10*3/uL (ref 3.4–10.8)

## 2019-09-28 LAB — CMP14+EGFR
ALT: 23 IU/L (ref 0–44)
AST: 15 IU/L (ref 0–40)
Albumin/Globulin Ratio: 1.8 (ref 1.2–2.2)
Albumin: 4.6 g/dL (ref 3.8–4.9)
Alkaline Phosphatase: 101 IU/L (ref 48–121)
BUN/Creatinine Ratio: 14 (ref 9–20)
BUN: 19 mg/dL (ref 6–24)
Bilirubin Total: 0.3 mg/dL (ref 0.0–1.2)
CO2: 23 mmol/L (ref 20–29)
Calcium: 9.9 mg/dL (ref 8.7–10.2)
Chloride: 92 mmol/L — ABNORMAL LOW (ref 96–106)
Creatinine, Ser: 1.35 mg/dL — ABNORMAL HIGH (ref 0.76–1.27)
GFR calc Af Amer: 69 mL/min/{1.73_m2} (ref >59)
GFR calc non Af Amer: 60 mL/min/{1.73_m2} (ref >59)
Globulin, Total: 2.6 g/dL (ref 1.5–4.5)
Glucose: 513 mg/dL (ref 65–99)
Potassium: 5.4 mmol/L — ABNORMAL HIGH (ref 3.5–5.2)
Sodium: 133 mmol/L — ABNORMAL LOW (ref 134–144)
Total Protein: 7.2 g/dL (ref 6.0–8.5)

## 2019-09-28 LAB — LIPID PANEL
Chol/HDL Ratio: 4.4 ratio (ref 0.0–5.0)
Cholesterol, Total: 150 mg/dL (ref 100–199)
HDL: 34 mg/dL — ABNORMAL LOW (ref >39)
LDL Chol Calc (NIH): 73 mg/dL (ref 0–99)
Triglycerides: 261 mg/dL — ABNORMAL HIGH (ref 0–149)
VLDL Cholesterol Cal: 43 mg/dL — ABNORMAL HIGH (ref 5–40)

## 2019-09-30 ENCOUNTER — Other Ambulatory Visit: Payer: Self-pay | Admitting: *Deleted

## 2019-09-30 ENCOUNTER — Other Ambulatory Visit: Payer: Self-pay

## 2019-09-30 MED ORDER — ONETOUCH DELICA LANCETS 33G MISC: 11 refills | Status: DC

## 2019-10-12 ENCOUNTER — Telehealth: Payer: Self-pay

## 2019-10-12 NOTE — Telephone Encounter (Signed)
Left message for patient to remind of missed remote transmission.  

## 2019-10-21 ENCOUNTER — Telehealth (INDEPENDENT_AMBULATORY_CARE_PROVIDER_SITE_OTHER): Payer: Medicare Other | Admitting: Family Medicine

## 2019-10-21 NOTE — Progress Notes (Signed)
Patient did not answer his phone, left a voicemail, also send video link and did not answer text message either. Arville Care, MD [facility] 10/21/2019, 4:47 PM

## 2019-12-29 ENCOUNTER — Telehealth: Payer: Self-pay | Admitting: Family Medicine

## 2019-12-29 NOTE — Telephone Encounter (Signed)
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 Wife says Dr [name] has done this in the past for them, but they have to have one written every year. Wife needs note today if possible.

## 2019-12-29 NOTE — Telephone Encounter (Signed)
Letter written per Dr. Oris Drone okay taken up front to patient

## 2020-01-22 ENCOUNTER — Encounter (HOSPITAL_COMMUNITY): Payer: Self-pay | Admitting: *Deleted

## 2020-01-22 ENCOUNTER — Inpatient Hospital Stay (HOSPITAL_COMMUNITY)
Admission: EM | Admit: 2020-01-22 | Discharge: 2020-02-04 | DRG: 233 | Disposition: A | Discharge status: Home Health | Payer: Medicare Other | Attending: Cardiothoracic Surgery | Admitting: Cardiothoracic Surgery

## 2020-01-22 ENCOUNTER — Other Ambulatory Visit: Payer: Self-pay

## 2020-01-22 ENCOUNTER — Emergency Department (HOSPITAL_COMMUNITY): Payer: Medicare Other

## 2020-01-22 DIAGNOSIS — I25119 Atherosclerotic heart disease of native coronary artery with unspecified angina pectoris: Secondary | ICD-10-CM | POA: Diagnosis present

## 2020-01-22 DIAGNOSIS — I5042 Chronic combined systolic (congestive) and diastolic (congestive) heart failure: Secondary | ICD-10-CM

## 2020-01-22 DIAGNOSIS — I340 Nonrheumatic mitral (valve) insufficiency: Secondary | ICD-10-CM | POA: Diagnosis not present

## 2020-01-22 DIAGNOSIS — D62 Acute posthemorrhagic anemia: Secondary | ICD-10-CM | POA: Diagnosis not present

## 2020-01-22 DIAGNOSIS — E782 Mixed hyperlipidemia: Secondary | ICD-10-CM | POA: Diagnosis present

## 2020-01-22 DIAGNOSIS — E875 Hyperkalemia: Secondary | ICD-10-CM | POA: Diagnosis present

## 2020-01-22 DIAGNOSIS — R0600 Dyspnea, unspecified: Secondary | ICD-10-CM | POA: Diagnosis not present

## 2020-01-22 DIAGNOSIS — R7989 Other specified abnormal findings of blood chemistry: Secondary | ICD-10-CM

## 2020-01-22 DIAGNOSIS — Z01810 Encounter for preprocedural cardiovascular examination: Secondary | ICD-10-CM | POA: Diagnosis not present

## 2020-01-22 DIAGNOSIS — E11649 Type 2 diabetes mellitus with hypoglycemia without coma: Secondary | ICD-10-CM | POA: Diagnosis not present

## 2020-01-22 DIAGNOSIS — I110 Hypertensive heart disease with heart failure: Secondary | ICD-10-CM | POA: Diagnosis present

## 2020-01-22 DIAGNOSIS — R079 Chest pain, unspecified: Secondary | ICD-10-CM

## 2020-01-22 DIAGNOSIS — F32A Depression, unspecified: Secondary | ICD-10-CM | POA: Diagnosis present

## 2020-01-22 DIAGNOSIS — I252 Old myocardial infarction: Secondary | ICD-10-CM

## 2020-01-22 DIAGNOSIS — F329 Major depressive disorder, single episode, unspecified: Secondary | ICD-10-CM | POA: Diagnosis present

## 2020-01-22 DIAGNOSIS — F419 Anxiety disorder, unspecified: Secondary | ICD-10-CM

## 2020-01-22 DIAGNOSIS — E785 Hyperlipidemia, unspecified: Secondary | ICD-10-CM | POA: Diagnosis present

## 2020-01-22 DIAGNOSIS — Z20822 Contact with and (suspected) exposure to covid-19: Secondary | ICD-10-CM | POA: Diagnosis present

## 2020-01-22 DIAGNOSIS — I213 ST elevation (STEMI) myocardial infarction of unspecified site: Secondary | ICD-10-CM | POA: Diagnosis not present

## 2020-01-22 DIAGNOSIS — R9431 Abnormal electrocardiogram [ECG] [EKG]: Secondary | ICD-10-CM

## 2020-01-22 DIAGNOSIS — T383X6A Underdosing of insulin and oral hypoglycemic [antidiabetic] drugs, initial encounter: Secondary | ICD-10-CM | POA: Diagnosis present

## 2020-01-22 DIAGNOSIS — I447 Left bundle-branch block, unspecified: Secondary | ICD-10-CM | POA: Diagnosis present

## 2020-01-22 DIAGNOSIS — I2721 Secondary pulmonary arterial hypertension: Secondary | ICD-10-CM | POA: Diagnosis present

## 2020-01-22 DIAGNOSIS — I5043 Acute on chronic combined systolic (congestive) and diastolic (congestive) heart failure: Secondary | ICD-10-CM | POA: Diagnosis present

## 2020-01-22 DIAGNOSIS — E1165 Type 2 diabetes mellitus with hyperglycemia: Secondary | ICD-10-CM | POA: Diagnosis present

## 2020-01-22 DIAGNOSIS — I10 Essential (primary) hypertension: Secondary | ICD-10-CM | POA: Diagnosis present

## 2020-01-22 DIAGNOSIS — I420 Dilated cardiomyopathy: Secondary | ICD-10-CM | POA: Diagnosis present

## 2020-01-22 DIAGNOSIS — E871 Hypo-osmolality and hyponatremia: Secondary | ICD-10-CM | POA: Diagnosis not present

## 2020-01-22 DIAGNOSIS — J9811 Atelectasis: Secondary | ICD-10-CM | POA: Diagnosis not present

## 2020-01-22 DIAGNOSIS — Z951 Presence of aortocoronary bypass graft: Secondary | ICD-10-CM

## 2020-01-22 DIAGNOSIS — E1151 Type 2 diabetes mellitus with diabetic peripheral angiopathy without gangrene: Secondary | ICD-10-CM | POA: Diagnosis present

## 2020-01-22 DIAGNOSIS — I69354 Hemiplegia and hemiparesis following cerebral infarction affecting left non-dominant side: Secondary | ICD-10-CM

## 2020-01-22 DIAGNOSIS — Z833 Family history of diabetes mellitus: Secondary | ICD-10-CM

## 2020-01-22 DIAGNOSIS — Z9889 Other specified postprocedural states: Secondary | ICD-10-CM

## 2020-01-22 DIAGNOSIS — J90 Pleural effusion, not elsewhere classified: Secondary | ICD-10-CM | POA: Diagnosis not present

## 2020-01-22 DIAGNOSIS — I69320 Aphasia following cerebral infarction: Secondary | ICD-10-CM

## 2020-01-22 DIAGNOSIS — J810 Acute pulmonary edema: Secondary | ICD-10-CM | POA: Diagnosis not present

## 2020-01-22 DIAGNOSIS — E1159 Type 2 diabetes mellitus with other circulatory complications: Secondary | ICD-10-CM | POA: Diagnosis present

## 2020-01-22 DIAGNOSIS — Z7982 Long term (current) use of aspirin: Secondary | ICD-10-CM

## 2020-01-22 DIAGNOSIS — Z955 Presence of coronary angioplasty implant and graft: Secondary | ICD-10-CM

## 2020-01-22 DIAGNOSIS — E118 Type 2 diabetes mellitus with unspecified complications: Secondary | ICD-10-CM | POA: Diagnosis not present

## 2020-01-22 DIAGNOSIS — J9601 Acute respiratory failure with hypoxia: Secondary | ICD-10-CM | POA: Diagnosis present

## 2020-01-22 DIAGNOSIS — R570 Cardiogenic shock: Secondary | ICD-10-CM | POA: Diagnosis not present

## 2020-01-22 DIAGNOSIS — J811 Chronic pulmonary edema: Secondary | ICD-10-CM | POA: Diagnosis not present

## 2020-01-22 DIAGNOSIS — R778 Other specified abnormalities of plasma proteins: Secondary | ICD-10-CM

## 2020-01-22 DIAGNOSIS — Z79899 Other long term (current) drug therapy: Secondary | ICD-10-CM

## 2020-01-22 DIAGNOSIS — I25110 Atherosclerotic heart disease of native coronary artery with unstable angina pectoris: Secondary | ICD-10-CM | POA: Diagnosis not present

## 2020-01-22 DIAGNOSIS — I214 Non-ST elevation (NSTEMI) myocardial infarction: Secondary | ICD-10-CM | POA: Diagnosis present

## 2020-01-22 DIAGNOSIS — R0602 Shortness of breath: Secondary | ICD-10-CM | POA: Diagnosis present

## 2020-01-22 DIAGNOSIS — K5900 Constipation, unspecified: Secondary | ICD-10-CM | POA: Diagnosis not present

## 2020-01-22 DIAGNOSIS — E119 Type 2 diabetes mellitus without complications: Secondary | ICD-10-CM | POA: Diagnosis not present

## 2020-01-22 DIAGNOSIS — I509 Heart failure, unspecified: Secondary | ICD-10-CM

## 2020-01-22 DIAGNOSIS — N179 Acute kidney failure, unspecified: Secondary | ICD-10-CM | POA: Diagnosis not present

## 2020-01-22 DIAGNOSIS — I255 Ischemic cardiomyopathy: Secondary | ICD-10-CM | POA: Diagnosis present

## 2020-01-22 DIAGNOSIS — I5023 Acute on chronic systolic (congestive) heart failure: Secondary | ICD-10-CM | POA: Diagnosis not present

## 2020-01-22 DIAGNOSIS — I2510 Atherosclerotic heart disease of native coronary artery without angina pectoris: Secondary | ICD-10-CM | POA: Diagnosis not present

## 2020-01-22 DIAGNOSIS — I959 Hypotension, unspecified: Secondary | ICD-10-CM | POA: Diagnosis not present

## 2020-01-22 DIAGNOSIS — Z794 Long term (current) use of insulin: Secondary | ICD-10-CM

## 2020-01-22 DIAGNOSIS — I25118 Atherosclerotic heart disease of native coronary artery with other forms of angina pectoris: Secondary | ICD-10-CM | POA: Diagnosis not present

## 2020-01-22 DIAGNOSIS — Z7902 Long term (current) use of antithrombotics/antiplatelets: Secondary | ICD-10-CM

## 2020-01-22 DIAGNOSIS — Z95810 Presence of automatic (implantable) cardiac defibrillator: Secondary | ICD-10-CM

## 2020-01-22 DIAGNOSIS — Z8249 Family history of ischemic heart disease and other diseases of the circulatory system: Secondary | ICD-10-CM

## 2020-01-22 LAB — COMPREHENSIVE METABOLIC PANEL
ALT: 38 U/L (ref 0–44)
AST: 28 U/L (ref 15–41)
Albumin: 3.3 g/dL — ABNORMAL LOW (ref 3.5–5.0)
Alkaline Phosphatase: 92 U/L (ref 38–126)
Anion gap: 9 (ref 5–15)
BUN: 24 mg/dL — ABNORMAL HIGH (ref 6–20)
CO2: 22 mmol/L (ref 22–32)
Calcium: 8.3 mg/dL — ABNORMAL LOW (ref 8.9–10.3)
Chloride: 97 mmol/L — ABNORMAL LOW (ref 98–111)
Creatinine, Ser: 1.03 mg/dL (ref 0.61–1.24)
GFR calc Af Amer: >60 mL/min (ref >60)
GFR calc non Af Amer: >60 mL/min (ref >60)
Glucose, Bld: 570 mg/dL (ref 70–99)
Potassium: 5.5 mmol/L — ABNORMAL HIGH (ref 3.5–5.1)
Sodium: 128 mmol/L — ABNORMAL LOW (ref 135–145)
Total Bilirubin: 0.6 mg/dL (ref 0.3–1.2)
Total Protein: 7.2 g/dL (ref 6.5–8.1)

## 2020-01-22 LAB — CBC WITH DIFFERENTIAL/PLATELET
Abs Immature Granulocytes: 0.05 10*3/uL (ref 0.00–0.07)
Basophils Absolute: 0 10*3/uL (ref 0.0–0.1)
Basophils Relative: 0 %
Eosinophils Absolute: 0 10*3/uL (ref 0.0–0.5)
Eosinophils Relative: 0 %
HCT: 45.5 % (ref 39.0–52.0)
Hemoglobin: 14.7 g/dL (ref 13.0–17.0)
Immature Granulocytes: 0 %
Lymphocytes Relative: 19 %
Lymphs Abs: 2.1 10*3/uL (ref 0.7–4.0)
MCH: 28.4 pg (ref 26.0–34.0)
MCHC: 32.3 g/dL (ref 30.0–36.0)
MCV: 87.8 fL (ref 80.0–100.0)
Monocytes Absolute: 1 10*3/uL (ref 0.1–1.0)
Monocytes Relative: 9 %
Neutro Abs: 8.3 10*3/uL — ABNORMAL HIGH (ref 1.7–7.7)
Neutrophils Relative %: 72 %
Platelets: 207 10*3/uL (ref 150–400)
RBC: 5.18 MIL/uL (ref 4.22–5.81)
RDW: 12.9 % (ref 11.5–15.5)
WBC: 11.5 10*3/uL — ABNORMAL HIGH (ref 4.0–10.5)
nRBC: 0 % (ref 0.0–0.2)

## 2020-01-22 LAB — BLOOD GAS, VENOUS
Acid-base deficit: 1.6 mmol/L (ref 0.0–2.0)
Bicarbonate: 22.1 mmol/L (ref 20.0–28.0)
FIO2: 32
O2 Saturation: 77.2 %
Patient temperature: 36.8
pCO2, Ven: 45.3 mmHg (ref 44.0–60.0)
pH, Ven: 7.333 (ref 7.250–7.430)
pO2, Ven: 47.3 mmHg — ABNORMAL HIGH (ref 32.0–45.0)

## 2020-01-22 LAB — TROPONIN I (HIGH SENSITIVITY)
Troponin I (High Sensitivity): 1013 ng/L (ref <18)
Troponin I (High Sensitivity): 886 ng/L (ref <18)
Troponin I (High Sensitivity): 880 ng/L (ref <18)

## 2020-01-22 LAB — CBG MONITORING, ED
Glucose-Capillary: >600 mg/dL (ref 70–99)
Glucose-Capillary: 311 mg/dL — ABNORMAL HIGH (ref 70–99)
Glucose-Capillary: 232 mg/dL — ABNORMAL HIGH (ref 70–99)

## 2020-01-22 LAB — BRAIN NATRIURETIC PEPTIDE: B Natriuretic Peptide: 788 pg/mL — ABNORMAL HIGH (ref 0.0–100.0)

## 2020-01-22 LAB — SARS CORONAVIRUS 2 BY RT PCR (HOSPITAL ORDER, PERFORMED IN ~~LOC~~ HOSPITAL LAB): SARS Coronavirus 2: NEGATIVE

## 2020-01-22 MED ORDER — FUROSEMIDE 10 MG/ML IJ SOLN: 40.0000 mg | Freq: Once | INTRAMUSCULAR | Status: DC

## 2020-01-22 MED ORDER — FUROSEMIDE 10 MG/ML IJ SOLN
40.0000 mg | Freq: Once | INTRAMUSCULAR | Status: AC
  Administered 2020-01-22: 40 mg via INTRAVENOUS
  Filled 2020-01-22: qty 4

## 2020-01-22 MED ORDER — MIRTAZAPINE 7.5 MG PO TABS
7.5000 mg | ORAL_TABLET | Freq: Every day | ORAL | Status: DC
  Administered 2020-01-22 – 2020-02-03 (×13): 7.5 mg via ORAL
  Filled 2020-01-22 (×14): qty 1

## 2020-01-22 MED ORDER — ENOXAPARIN SODIUM 40 MG/0.4ML ~~LOC~~ SOLN: 40.0000 mg | SUBCUTANEOUS | Status: DC

## 2020-01-22 MED ORDER — MORPHINE SULFATE (PF) 2 MG/ML IV SOLN
1.0000 mg | INTRAVENOUS | Status: DC | PRN
  Administered 2020-01-23 – 2020-01-26 (×2): 1 mg via INTRAVENOUS
  Filled 2020-01-22 (×2): qty 1

## 2020-01-22 MED ORDER — SODIUM CHLORIDE 0.9% FLUSH
3.0000 mL | Freq: Two times a day (BID) | INTRAVENOUS | Status: DC
  Administered 2020-01-24 – 2020-01-30 (×4): 3 mL via INTRAVENOUS

## 2020-01-22 MED ORDER — ACETAMINOPHEN 325 MG PO TABS: 650.0000 mg | ORAL_TABLET | ORAL | Status: DC | PRN

## 2020-01-22 MED ORDER — DULOXETINE HCL 60 MG PO CPEP
60.0000 mg | ORAL_CAPSULE | Freq: Every day | ORAL | Status: DC
  Administered 2020-01-22 – 2020-02-04 (×13): 60 mg via ORAL
  Filled 2020-01-22 (×5): qty 1
  Filled 2020-01-22: qty 2
  Filled 2020-01-22 (×6): qty 1
  Filled 2020-01-22: qty 2

## 2020-01-22 MED ORDER — FUROSEMIDE 10 MG/ML IJ SOLN
60.0000 mg | Freq: Two times a day (BID) | INTRAMUSCULAR | Status: DC
  Administered 2020-01-22: 60 mg via INTRAVENOUS
  Filled 2020-01-22 (×2): qty 6

## 2020-01-22 MED ORDER — SODIUM CHLORIDE 0.9% FLUSH: 3.0000 mL | INTRAVENOUS | Status: DC | PRN

## 2020-01-22 MED ORDER — SODIUM CHLORIDE 0.9 % IV SOLN: 250.0000 mL | INTRAVENOUS | Status: DC | PRN

## 2020-01-22 MED ORDER — HEPARIN (PORCINE) 25000 UT/250ML-% IV SOLN
1500.0000 [IU]/h | INTRAVENOUS | Status: DC
  Administered 2020-01-22: 1100 [IU]/h via INTRAVENOUS
  Administered 2020-01-24: 1500 [IU]/h via INTRAVENOUS
  Filled 2020-01-22 (×3): qty 250

## 2020-01-22 MED ORDER — INSULIN ASPART 100 UNIT/ML ~~LOC~~ SOLN: 0.0000 [IU] | Freq: Three times a day (TID) | SUBCUTANEOUS | Status: DC

## 2020-01-22 MED ORDER — HEPARIN BOLUS VIA INFUSION
4000.0000 [IU] | Freq: Once | INTRAVENOUS | Status: AC
  Administered 2020-01-22: 4000 [IU] via INTRAVENOUS

## 2020-01-22 MED ORDER — LORAZEPAM 0.5 MG PO TABS
0.5000 mg | ORAL_TABLET | Freq: Once | ORAL | Status: AC
  Administered 2020-01-22: 0.5 mg via ORAL
  Filled 2020-01-22: qty 1

## 2020-01-22 MED ORDER — ASPIRIN 81 MG PO CHEW
324.0000 mg | CHEWABLE_TABLET | Freq: Once | ORAL | Status: AC
  Administered 2020-01-22: 324 mg via ORAL
  Filled 2020-01-22: qty 4

## 2020-01-22 MED ORDER — CLOPIDOGREL BISULFATE 75 MG PO TABS
75.0000 mg | ORAL_TABLET | Freq: Every day | ORAL | Status: DC
  Administered 2020-01-22 – 2020-01-24 (×3): 75 mg via ORAL
  Filled 2020-01-22 (×3): qty 1

## 2020-01-22 MED ORDER — INSULIN GLARGINE 100 UNIT/ML ~~LOC~~ SOLN
10.0000 [IU] | Freq: Two times a day (BID) | SUBCUTANEOUS | Status: DC
  Administered 2020-01-22: 10 [IU] via SUBCUTANEOUS
  Filled 2020-01-22 (×4): qty 0.1

## 2020-01-22 MED ORDER — INSULIN ASPART 100 UNIT/ML ~~LOC~~ SOLN
5.0000 [IU] | Freq: Once | SUBCUTANEOUS | Status: AC
  Administered 2020-01-22: 5 [IU] via INTRAVENOUS
  Filled 2020-01-22: qty 1

## 2020-01-22 MED ORDER — LISINOPRIL 5 MG PO TABS
2.5000 mg | ORAL_TABLET | Freq: Every day | ORAL | Status: DC
  Administered 2020-01-22: 2.5 mg via ORAL
  Filled 2020-01-22: qty 1

## 2020-01-22 MED ORDER — CARVEDILOL 12.5 MG PO TABS
25.0000 mg | ORAL_TABLET | Freq: Two times a day (BID) | ORAL | Status: DC
  Administered 2020-01-22: 25 mg via ORAL
  Filled 2020-01-22: qty 2

## 2020-01-22 MED ORDER — ASPIRIN EC 81 MG PO TBEC
81.0000 mg | DELAYED_RELEASE_TABLET | Freq: Every day | ORAL | Status: DC
  Administered 2020-01-22 – 2020-01-24 (×3): 81 mg via ORAL
  Filled 2020-01-22 (×3): qty 1

## 2020-01-22 MED ORDER — TAMSULOSIN HCL 0.4 MG PO CAPS
0.4000 mg | ORAL_CAPSULE | Freq: Every day | ORAL | Status: DC
  Administered 2020-01-24 – 2020-02-03 (×10): 0.4 mg via ORAL
  Filled 2020-01-22 (×10): qty 1

## 2020-01-22 MED ORDER — PRIMIDONE 50 MG PO TABS
50.0000 mg | ORAL_TABLET | Freq: Every day | ORAL | Status: DC
  Administered 2020-01-22 – 2020-02-03 (×13): 50 mg via ORAL
  Filled 2020-01-22 (×18): qty 1

## 2020-01-22 MED ORDER — ONDANSETRON HCL 4 MG/2ML IJ SOLN: 4.0000 mg | Freq: Four times a day (QID) | INTRAMUSCULAR | Status: DC | PRN

## 2020-01-22 MED ORDER — NITROGLYCERIN 0.4 MG SL SUBL: 0.4000 mg | SUBLINGUAL_TABLET | SUBLINGUAL | Status: DC | PRN

## 2020-01-22 MED ORDER — ATORVASTATIN CALCIUM 80 MG PO TABS
80.0000 mg | ORAL_TABLET | Freq: Every day | ORAL | Status: DC
  Administered 2020-01-22 – 2020-02-04 (×13): 80 mg via ORAL
  Filled 2020-01-22: qty 1
  Filled 2020-01-22: qty 2
  Filled 2020-01-22 (×2): qty 1
  Filled 2020-01-22: qty 2
  Filled 2020-01-22 (×8): qty 1

## 2020-01-22 NOTE — ED Notes (Signed)
Date and time results received: 01/22/20 1729   Test: Troponin Critical Value: 1013  Name of Provider Notified: Sophia Caccavale,PA  Orders Received? Or Actions Taken?: No new orders given.

## 2020-01-22 NOTE — ED Provider Notes (Signed)
[facility] EMERGENCY DEPARTMENT Provider Note   CSN: [id number] Arrival date & time: 01/22/20  1548     History Chief Complaint  Patient presents with  . Shortness of Breath    Douglas Carr is a 54 y.o. male presenting for evaluation of shortness of breath.  Patient states he has not been feeling well for the past 4 days.  He reports gradually worsening shortness of breath.  He reports associated chest pressure when he is laying flat.  He has associated cough, producing yellow sputum.  He states he has not been taking his medicine for several months, restarted today.  He also reports that he checked his blood sugar, it read 'high'.  He denies fever, chills, nausea, vomiting, abdominal pain, urinary symptoms, abnormal bowel movements.  He denies sick contacts.  He did receive the Covid vaccine.  HPI     Past Medical History:  Diagnosis Date  . Acute ST elevation myocardial infarction (STEMI) involving left circumflex coronary artery ([place]) 05/07/2016   PCI to Cx-OM  . Anginal pain ([place])    secondary to sm. vessel disease  . Anxiety   . Arthritis    BIL KNEE PAIN AND BIL ANKLE PAIN  . Bone spur of ankle   . Cardiac arrest ([place]) 05/24/2016   with v fib  . 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 Coronary artery disease involving native coronary artery of native heart with angina pectoris ([place]) 05/24/2016   Remote MI at 54 years of age, Last cath 2010-diffuse non-obstructive disease; last echo 06/16/08 -normal LV function, moderate concentric hypertrophy; nuc 08/2008 no ischemia;  medical therapy; STEMI May 07 2016 - PCI to Cx-OM  . Diabetes mellitus    ON ORAL MEDICATION AND INSULIN  . Dilated cardiomyopathy ([place]) 05/24/2016   EF 325-30% by Echo post STEMI (previously 30-35%)   . Hyperlipidemia   . Hypertension   . Myocardial infarction ([place]) 1996   Post MI  . Peripheral vascular disease ([place])    HAS LEFT CAROTID ARTERY  STENOSIS   AND IS S/P RIGHT CAROTID ENDARTERECTOMY 2010 Last carotid dopplers 01/08/2012 wth patent endarterectomy site  . Status post coronary artery stent placement     Patient Active Problem List   Diagnosis Date Noted  . Heart failure ([place]) 01/22/2020  . Hyperlipidemia 03/31/2018  . Anxiety and depression 11/19/2016  . Ischemic cardiomyopathy 06/30/2016  . Diabetes mellitus with complication ([place])   . Diabetes mellitus type 2 in obese ([place])   . Acute blood loss anemia   . Myoclonus   . Anoxic brain injury ([place])   . Dilated cardiomyopathy ([place]) 05/24/2016  . Coronary artery disease involving native coronary artery of native heart with angina pectoris ([place]) 05/24/2016  . 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 Status post coronary artery stent placement   . Essential hypertension 09/21/2013  . Pulmonary nodules 10/16/2011    Past Surgical History:  Procedure Laterality Date  . APPENDECTOMY    . CARDIAC CATHETERIZATION     FEB 2010, significant branch vessel disease wth diag, marginal, PDA & PLA, nml. LV function  . CARDIAC CATHETERIZATION N/A 05/07/2016   Procedure: Left Heart Cath and Coronary Angiography;  Surgeon: Peter M Martinique, MD;  Location: [place] [facility];  Service: Cardiovascular;  Laterality: N/A;  . CARDIAC CATHETERIZATION N/A 05/07/2016   Procedure: Coronary Stent Intervention;  Surgeon: Peter M Martinique,  MD;  Location: [place] [facility];  Service: Cardiovascular;  Laterality: N/A;  . CARDIAC CATHETERIZATION N/A 05/24/2016   Procedure: Left Heart Cath and Coronary Angiography;  Surgeon: [name], MD;  Location: [place] [facility];  Service: Cardiovascular;  Laterality: N/A;  . CARDIAC CATHETERIZATION N/A 05/24/2016   Procedure: Coronary Stent Intervention;  Surgeon: [name], MD;  Location: [place] [facility];  Service: Cardiovascular;  Laterality: N/A;  2.5x20 Promus to Ostial/proximal circumflex  . CAROTID  ENDARTERECTOMY  09/2008   Rt CEA  . ICD IMPLANT N/A 06/30/2016   Procedure: ICD Implant;  Surgeon: Will Meredith Leeds, MD;  Location: [place] [facility];  Service: Cardiovascular;  Laterality: N/A;  . LUNG BIOPSY  2013   Bx's suggest granulomatous dz.  . RT ANKLE   2013       Family History  Problem Relation Age of Onset  . Hypertension Mother   . Diabetes Mother   . Hypertension Maternal Grandfather   . Heart attack Paternal Grandfather   . Heart attack Father 46    Social History   Tobacco Use  . Smoking status: Former Smoker    Packs/day: 2.00    Years: 22.00    Pack years: 44.00    Types: Cigarettes    Quit date: 10/23/2011    Years since quitting: 8.2  . Smokeless tobacco: Current User    Types: Snuff  Vaping Use  . Vaping Use: Never used  Substance Use Topics  . Alcohol use: No  . Drug use: No    Home Medications Prior to Admission medications   Medication Sig Start Date End Date Taking? Authorizing Provider  aspirin EC 81 MG tablet Take 81 mg by mouth daily.   Yes [provider]  atorvastatin (LIPITOR) 80 MG tablet Take 1 tablet (80 mg total) by mouth daily. 03/30/19  Yes [name], Fransisca Kaufmann, MD  blood glucose meter kit and supplies KIT Inject 1 each into the skin 4 (four) times daily as needed. Dispense based on patient and insurance preference. Use up to four times daily as directed. (FOR ICD-9 250.00, 250.01). 06/30/19  Yes [name], Fransisca Kaufmann, MD  carvedilol (COREG) 25 MG tablet Take 1 tablet (25 mg total) by mouth 2 (two) times daily. Please make overdue appt with Dr. Curt Bears and keep appt before anymore refills. Final Attempt 03/30/19  Yes [name], Fransisca Kaufmann, MD  clopidogrel (PLAVIX) 75 MG tablet TAKE 1 TABLET BY MOUTH EVERY DAY IN THE EVENING 03/30/19  Yes [name], Fransisca Kaufmann, MD  DULoxetine (CYMBALTA) 60 MG capsule Take 1 capsule (60 mg total) by mouth daily. 09/27/19  Yes [name], Fransisca Kaufmann, MD  glucose blood (ONETOUCH VERIO) test strip 1 each  by Other route in the morning, at noon, in the evening, and at bedtime. use for testing 06/30/19  Yes [name], Fransisca Kaufmann, MD  ibuprofen (ADVIL,MOTRIN) 200 MG tablet Take 200 mg by mouth every 6 (six) hours as needed for moderate pain.   Yes [provider]  insulin degludec (TRESIBA FLEXTOUCH) 200 UNIT/ML FlexTouch Pen Inject 116 Units into the skin daily. Patient taking differently: Inject 160 Units into the skin daily.  09/27/19  Yes [name], Fransisca Kaufmann, MD  Insulin Pen Needle (B-D ULTRAFINE III SHORT PEN) 31G X 8 MM MISC USE TO INJECT INSULIN DAILY DX E11.9 08/06/18  Yes [name], Fransisca Kaufmann, MD  lisinopril (ZESTRIL) 2.5 MG tablet Take 1 tablet (2.5 mg total) by mouth daily. 03/30/19  Yes [name], Vonna Kotyk  A, MD  metFORMIN (GLUCOPHAGE) 500 MG tablet Take 1 tablet (500 mg total) by mouth 2 (two) times daily. 03/30/19  Yes [name], Fransisca Kaufmann, MD  mirtazapine (REMERON) 7.5 MG tablet TAKE 1 TABLET AT BEDTIME FOR DEPRESSION 03/30/19  Yes [name], Fransisca Kaufmann, MD  OneTouch Delica Lancets 09B MISC Test blood sugars three times daily 09/30/19  Yes [name], Fransisca Kaufmann, MD  potassium chloride SA (KLOR-CON M20) 20 MEQ tablet Take 1 tablet (20 mEq total) by mouth daily. 07/01/18  Yes [name], Fransisca Kaufmann, MD  primidone (MYSOLINE) 50 MG tablet TAKE 1 TABLET BY MOUTH EVERYDAY AT BEDTIME Patient taking differently: Take 50 mg by mouth at bedtime.  04/05/18  Yes Tat, Eustace Quail, DO  Semaglutide,0.25 or 0.5MG/DOS, (OZEMPIC, 0.25 OR 0.5 MG/DOSE,) 2 MG/1.5ML SOPN Inject 0.5 mg into the skin once a week. Patient taking differently: Inject 0.5 mg into the skin every 14 (fourteen) days.  09/27/19  Yes [name], Fransisca Kaufmann, MD  tamsulosin (FLOMAX) 0.4 MG CAPS capsule TAKE 1 CAPSULE DAILY IN THE EVENING 03/30/19  Yes [name], Fransisca Kaufmann, MD  topiramate (TOPAMAX) 25 MG tablet Take 1 tablet (25 mg total) by mouth at bedtime as needed. 09/27/19  Yes [name], Fransisca Kaufmann, MD  torsemide (DEMADEX) 20 MG tablet Take 1 tablet (20 mg  total) by mouth daily. (Needs to be seen 03/30/19  Yes [name], Fransisca Kaufmann, MD  Alirocumab (PRALUENT) 150 MG/ML SOAJ Inject 150 mg into the skin every 14 (fourteen) days. Patient not taking: Reported on 09/27/2019 05/23/19   Lorretta Harp, MD  nitroGLYCERIN (NITROSTAT) 0.4 MG SL tablet PLACE 1 TABLET (0.4 MG TOTAL) UNDER THE TONGUE EVERY 5 (FIVE) MINUTES AS NEEDED FOR CHEST PAIN. 11/25/18   [name], Fransisca Kaufmann, MD    Allergies    Patient has no known allergies.  Review of Systems   Review of Systems  Respiratory: Positive for shortness of breath.   Cardiovascular:       Chest pressure  All other systems reviewed and are negative.   Physical Exam Updated Vital Signs BP 118/86   Pulse 95   Temp 98.2 F (36.8 C) (Oral)   Resp (!) 32   Ht 5' 10"  (1.778 m)   Wt 90.7 kg   SpO2 96%   BMI 28.70 kg/m   Physical Exam Vitals and nursing note reviewed.  Constitutional:      General: He is in acute distress.     Appearance: He is well-developed. He is ill-appearing and diaphoretic.     Comments: Appears ill. Grayish skin tone, diaphoretic  HENT:     Head: Normocephalic and atraumatic.  Eyes:     Conjunctiva/sclera: Conjunctivae normal.     Pupils: Pupils are equal, round, and reactive to light.  Cardiovascular:     Rate and Rhythm: Normal rate and regular rhythm.     Pulses: Normal pulses.  Pulmonary:     Effort: Tachypnea and accessory muscle usage present.     Breath sounds: Normal breath sounds. No wheezing.     Comments: Sats in the 80s on room air.  Improved on 2 L via nasal cannula.  Tachypneic around 30.  Clear lung sounds. Abdominal:     General: There is no distension.     Palpations: Abdomen is soft. There is no mass.     Tenderness: There is no abdominal tenderness. There is no guarding or rebound.  Musculoskeletal:        General: Normal range of motion.  Cervical back: Normal range of motion and neck supple.     Right lower leg: No edema.     Left lower  leg: No edema.     Comments: No pitting edema  Skin:    General: Skin is warm.     Capillary Refill: Capillary refill takes less than 2 seconds.  Neurological:     Mental Status: He is alert and oriented to person, place, and time.     ED Results / Procedures / Treatments   Labs (all labs ordered are listed, but only abnormal results are displayed) Labs Reviewed  CBC WITH DIFFERENTIAL/PLATELET - Abnormal; Notable for the following components:      Result Value   WBC 11.5 (*)    Neutro Abs 8.3 (*)    All other components within normal limits  COMPREHENSIVE METABOLIC PANEL - Abnormal; Notable for the following components:   Sodium 128 (*)    Potassium 5.5 (*)    Chloride 97 (*)    Glucose, Bld 570 (*)    BUN 24 (*)    Calcium 8.3 (*)    Albumin 3.3 (*)    All other components within normal limits  BLOOD GAS, VENOUS - Abnormal; Notable for the following components:   pO2, Ven 47.3 (*)    All other components within normal limits  BRAIN NATRIURETIC PEPTIDE - Abnormal; Notable for the following components:   B Natriuretic Peptide 788.0 (*)    All other components within normal limits  CBG MONITORING, ED - Abnormal; Notable for the following components:   Glucose-Capillary >600 (*)    All other components within normal limits  TROPONIN I (HIGH SENSITIVITY) - Abnormal; Notable for the following components:   Troponin I (High Sensitivity) 1,013 (*)    All other components within normal limits  TROPONIN I (HIGH SENSITIVITY) - Abnormal; Notable for the following components:   Troponin I (High Sensitivity) 886 (*)    All other components within normal limits  SARS CORONAVIRUS 2 BY RT PCR (HOSPITAL ORDER, [facility])  URINALYSIS, ROUTINE W REFLEX MICROSCOPIC  CBC  HEPARIN LEVEL (UNFRACTIONATED)  CBG MONITORING, ED    EKG EKG Interpretation  Date/Time:  Sunday January 22 2020 16:16:06 EDT Ventricular Rate:  97 PR Interval:    QRS  Duration: 134 QT Interval:  335 QTC Calculation: 426 R Axis:   -16 Text Interpretation: Sinus rhythm Probable left atrial enlargement IVCD, consider atypical RBBB Probable anteroseptal infarct, recent Lateral leads are also involved Confirmed by Fredia Sorrow [phone]) on 01/22/2020 4:24:38 PM   Radiology DG Chest Portable 1 View  Result Date: 01/22/2020 CLINICAL DATA:  54 year old male with shortness of breath for 4 days. Recently tested negative for COVID-19. EXAM: PORTABLE CHEST 1 VIEW COMPARISON:  Portable chest 05/20/2017 and earlier. FINDINGS: Portable AP upright view at 1628 hours. Perihilar and basilar predominant bilateral increased interstitial markings. Veiling opacity at the left lung base compatible with small pleural effusion. Possible trace right pleural effusion. No pneumothorax. Mediastinal contours remain normal. Chronic left chest cardiac AICD. Chronic calcified granulomas in the right lung. Visualized tracheal air column is within normal limits. No acute osseous abnormality identified. IMPRESSION: Increased bilateral pulmonary interstitial markings and left pleural effusion. Favor acute pulmonary edema. Electronically Signed   By: Genevie Ann M.D.   On: 01/22/2020 16:37    Procedures .Critical Care Performed by: Franchot Heidelberg, PA-C Authorized by: Franchot Heidelberg, PA-C   Critical care provider statement:    Critical care  time (minutes):  50   Critical care time was exclusive of:  Separately billable procedures and treating other patients and teaching time   Critical care was necessary to treat or prevent imminent or life-threatening deterioration of the following conditions:  Respiratory failure, endocrine crisis and cardiac failure   Critical care was time spent personally by me on the following activities:  Blood draw for specimens, development of treatment plan with patient or surrogate, discussions with consultants, examination of patient, obtaining history from patient  or surrogate, evaluation of patient's response to treatment, ordering and performing treatments and interventions, ordering and review of laboratory studies, ordering and review of radiographic studies, re-evaluation of patient's condition, pulse oximetry and review of old charts   I assumed direction of critical care for this patient from another provider in my specialty: no   Comments:     Pt with elevated trop on heparin gtt and admitted. Pt also hypoxic with elevated CBG.   (including critical care time)  Medications Ordered in ED Medications  furosemide (LASIX) injection 40 mg (40 mg Intramuscular Not Given 01/22/20 1845)  heparin bolus via infusion 4,000 Units (4,000 Units Intravenous Bolus from Bag 01/22/20 1855)    Followed by  heparin ADULT infusion 100 units/mL (25000 units/253m sodium chloride 0.45%) (1,100 Units/hr Intravenous New Bag/Given 01/22/20 1854)  aspirin chewable tablet 324 mg (324 mg Oral Given 01/22/20 1715)  furosemide (LASIX) injection 40 mg (40 mg Intravenous Given 01/22/20 1851)  LORazepam (ATIVAN) tablet 0.5 mg (0.5 mg Oral Given 01/22/20 1932)  insulin aspart (novoLOG) injection 5 Units (5 Units Intravenous Given 01/22/20 1932)    ED Course  I have reviewed the triage vital signs and the nursing notes.  Pertinent labs & imaging results that were available during my care of the patient were reviewed by me and considered in my medical decision making (see chart for details).    MDM Rules/Calculators/A&P                          Patient presenting for evaluation of shortness of breath.  On exam, patient appears ill.  He has a gray skin tone and is diaphoretic.  He reports chest pain which began yesterday but is only present when laying flat.  No chest pain when sitting up.  He is tachypneic, and visibly appears short of breath.  Hypoxic on room air.  Consider ACS, especially with patient's significant cardiac history, consider CHF exacerbation, consider PE.  Consider  infectious cause such as pneumonia or Covid.  Will obtain labs, EKG, chest x-ray.  Aspirin given for possible cardiac cause. Case discussed with attending, Dr. ZRogene Houstonevaluated the pt.   Patient with elevated blood sugar at 570, but no signs of DKA.  Mild leukocytosis of 11. Elevated K of 5.5.  BNP elevated at 788.  Chest x-ray viewed interpreted by me, shows fluid with a left-sided pleural effusion.  This could be contributing to symptoms.  Covid negative.  Troponin elevated at 2000.  As such, will consult with cardiology, this special in the setting of EKG changes when compared to 2019.  Dr. ZRogene Houstondiscussed with Dr. STamala Julianfrom cardiology. Recommended starting heparin. Admit to medicine. Feels in the setting of electrolyte abnormities, pt would not need emergent cath.   Will order delta trop and plan to admit to medicine.   On reassessment, patient appears improved.  He remains tachypneic around 30, however he is no longer diaphoretic.  He has received  insulin and Lasix to address his potassium/electrolyte abnormalities and signs of CHF.  Delta trop downtrending in the 800s.  Will call for admission to hospitalist service.  Discussed with Dr. Cathlean Sauer from [facility] hospitalist service, patient to be admitted.  Final Clinical Impression(s) / ED Diagnoses Final diagnoses:  Shortness of breath  Elevated troponin  Abnormal EKG    Rx / DC Orders ED Discharge Orders    None       Franchot Heidelberg, PA-C 01/22/20 2010    Fredia Sorrow, MD 02/10/20 [phone]

## 2020-01-22 NOTE — Progress Notes (Signed)
ANTICOAGULATION CONSULT NOTE - Initial Consult  Pharmacy Consult for Heparin Indication: chest pain/ACS  No Known Allergies  Patient Measurements: Height: 5\' 10"  (177.8 cm) Weight: 90.7 kg (200 lb) IBW/kg (Calculated) : 73 HEPARIN DW (KG): 90.7  Vital Signs: Temp: 98.2 F (36.8 C) (09/19 1621) Temp Source: Oral (09/19 1621) BP: 151/101 (09/19 1630) Pulse Rate: 98 (09/19 1630)  Labs: Recent Labs    01/22/20 1614 01/22/20 1638  HGB 14.7  --   HCT 45.5  --   PLT 207  --   CREATININE 1.03  --   TROPONINIHS  --  1,013*    Estimated Creatinine Clearance: 94 mL/min (by C-G formula based on SCr of 1.03 mg/dL).   Medical History: Past Medical History:  Diagnosis Date  . Acute ST elevation myocardial infarction (STEMI) involving left circumflex coronary artery ([facility]) 05/07/2016   PCI to Cx-OM  . Anginal pain ([facility])    secondary to sm. vessel disease  . Anxiety   . Arthritis    BIL KNEE PAIN AND BIL ANKLE PAIN  . Bone spur of ankle   . Cardiac arrest ([facility]) 05/24/2016   with v fib  . 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 Coronary artery disease involving native coronary artery of native heart with angina pectoris ([facility]) 05/24/2016   Remote MI at 54 years of age, Last cath 2010-diffuse non-obstructive disease; last echo 06/16/08 -normal LV function, moderate concentric hypertrophy; nuc 08/2008 no ischemia;  medical therapy; STEMI May 07 2016 - PCI to Cx-OM  . Diabetes mellitus    ON ORAL MEDICATION AND INSULIN  . Dilated cardiomyopathy ([facility]) 05/24/2016   EF 325-30% by Echo post STEMI (previously 30-35%)   . Hyperlipidemia   . Hypertension   . Myocardial infarction ([facility]) 1996   Post MI  . Peripheral vascular disease ([facility])    HAS LEFT CAROTID ARTERY STENOSIS   AND IS S/P RIGHT CAROTID ENDARTERECTOMY 2010 Last carotid dopplers 01/08/2012 wth patent endarterectomy site  . Status post coronary artery stent placement      Medications:  See med rec  Assessment: Patient presented with shortness of breath and tachypneic. He has had chest pressure and labs show elevated troponin. Reviewed home meds and patient not on oral anticoagulants. Pharmacy to dose heparin.  Goal of Therapy:  Heparin level 0.3-0.7 units/ml Monitor platelets by anticoagulation protocol: Yes   Plan:  Give 4000 units bolus x 1 Start heparin infusion at 1100 units/hr Check anti-Xa level in ~6-8 hours and daily while on heparin Continue to monitor H&H and platelets  03/09/2012, BS Elder Cyphers, BCPS Clinical Pharmacist Pager [phone] 01/22/2020,5:55 PM

## 2020-01-22 NOTE — ED Notes (Signed)
Date and time results received: 01/22/20 1712   Test: 570 Critical Value: Glucose  Name of Provider Notified: Sophia Caccavale,PA  Orders Received? Or Actions Taken?: No new orders given.

## 2020-01-22 NOTE — H&P (Signed)
History and Physical    Douglas Carr BSJ:[id number] DOB: 1966/04/03 DOA: 01/22/2020  PCP: [name], Fransisca Kaufmann, MD   Patient coming from: Home   Chief Complaint:  Dyspnea and chest pain.   HPI: Douglas Carr is a 54 y.o. male with medical history significant of ischemic cardiomyopathy, systolic heart failure, type 2 diabetes mellitus, dyslipidemia, hypertension and anxiety. Patient reports not feeling well for about 4 days, mainly experiencing dyspnea, slowly progressive over the last 4 days, to the point where he is having symptoms with minimal efforts.  His dyspnea has been moderate initially and now severe in intensity, worse with exertion, no improving factors, associated with chest pain, orthopnea and lower extremity edema. Positive cough and wheezing.   Patient stopped taking his meds medications few months ago with no particular reason. At home his appetite was preserved and his glucose has been persistently elevated.  He has been not taking his insulin.  Today due to severe and persistent symptoms he was brought to the hospital by his family.   ED Course: Patient ill looking appearing, in distress, fluid overloaded, positive hyperglycemia with a very high elevated troponin.  His EKG had lateral T wave inversions and prominent J-point elevation V1 through V3.  Cardiology was consulted over phone, recommendations for medical management.  Review of Systems:  1. General: No fevers, no chills, no weight gain or weight loss 2. ENT: No runny nose or sore throat, no hearing disturbances 3. Pulmonary: positive dyspnea, cough, and wheezing, but no hemoptysis 4. Cardiovascular: No angina or claudication, positive lower extremity edema, pnd and orthopnea. Positive chest pain.  5. Gastrointestinal: No nausea or vomiting, no diarrhea or constipation 6. Hematology: No easy bruisability or frequent infections 7. Urology: No dysuria, hematuria or increased urinary frequency 8.  Dermatology: No rashes. 9. Neurology: No seizures or paresthesias 10. Musculoskeletal: No joint pain or deformities  Past Medical History:  Diagnosis Date  . Acute ST elevation myocardial infarction (STEMI) involving left circumflex coronary artery ([place]) 05/07/2016   PCI to Cx-OM  . Anginal pain ([place])    secondary to sm. vessel disease  . Anxiety   . Arthritis    BIL KNEE PAIN AND BIL ANKLE PAIN  . Bone spur of ankle   . Cardiac arrest ([place]) 05/24/2016   with v fib  . 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 Coronary artery disease involving native coronary artery of native heart with angina pectoris ([place]) 05/24/2016   Remote MI at 54 years of age, Last cath 2010-diffuse non-obstructive disease; last echo 06/16/08 -normal LV function, moderate concentric hypertrophy; nuc 08/2008 no ischemia;  medical therapy; STEMI May 07 2016 - PCI to Cx-OM  . Diabetes mellitus    ON ORAL MEDICATION AND INSULIN  . Dilated cardiomyopathy ([place]) 05/24/2016   EF 325-30% by Echo post STEMI (previously 30-35%)   . Hyperlipidemia   . Hypertension   . Myocardial infarction ([place]) 1996   Post MI  . Peripheral vascular disease ([place])    HAS LEFT CAROTID ARTERY STENOSIS   AND IS S/P RIGHT CAROTID ENDARTERECTOMY 2010 Last carotid dopplers 01/08/2012 wth patent endarterectomy site  . Status post coronary artery stent placement     Past Surgical History:  Procedure Laterality Date  . APPENDECTOMY    . CARDIAC CATHETERIZATION     FEB 2010, significant branch vessel disease wth diag, marginal, PDA & PLA, nml. LV function  . CARDIAC CATHETERIZATION N/A 05/07/2016  Procedure: Left Heart Cath and Coronary Angiography;  Surgeon: Peter M Martinique, MD;  Location: [facility];  Service: Cardiovascular;  Laterality: N/A;  . CARDIAC CATHETERIZATION N/A 05/07/2016   Procedure: Coronary Stent Intervention;  Surgeon: Peter M Martinique, MD;  Location: [facility];  Service:  Cardiovascular;  Laterality: N/A;  . CARDIAC CATHETERIZATION N/A 05/24/2016   Procedure: Left Heart Cath and Coronary Angiography;  Surgeon: [name], MD;  Location: [facility];  Service: Cardiovascular;  Laterality: N/A;  . CARDIAC CATHETERIZATION N/A 05/24/2016   Procedure: Coronary Stent Intervention;  Surgeon: [name], MD;  Location: [facility];  Service: Cardiovascular;  Laterality: N/A;  2.5x20 Promus to Ostial/proximal circumflex  . CAROTID ENDARTERECTOMY  09/2008   Rt CEA  . ICD IMPLANT N/A 06/30/2016   Procedure: ICD Implant;  Surgeon: Will Meredith Leeds, MD;  Location: [facility];  Service: Cardiovascular;  Laterality: N/A;  . LUNG BIOPSY  2013   Bx's suggest granulomatous dz.  . RT ANKLE   2013     reports that he quit smoking about 8 years ago. His smoking use included cigarettes. He has a 44.00 pack-year smoking history. His smokeless tobacco use includes snuff. He reports that he does not drink alcohol and does not use drugs.  No Known Allergies  Family History  Problem Relation Age of Onset  . Hypertension Mother   . Diabetes Mother   . Hypertension Maternal Grandfather   . Heart attack Paternal Grandfather   . Heart attack Father 40     Prior to Admission medications   Medication Sig Start Date End Date Taking? Authorizing Provider  aspirin EC 81 MG tablet Take 81 mg by mouth daily.   Yes [provider]  atorvastatin (LIPITOR) 80 MG tablet Take 1 tablet (80 mg total) by mouth daily. 03/30/19  Yes [name], Fransisca Kaufmann, MD  blood glucose meter kit and supplies KIT Inject 1 each into the skin 4 (four) times daily as needed. Dispense based on patient and insurance preference. Use up to four times daily as directed. (FOR ICD-9 250.00, 250.01). 06/30/19  Yes [name], Fransisca Kaufmann, MD  carvedilol (COREG) 25 MG tablet Take 1 tablet (25 mg total) by mouth 2 (two) times daily. Please make overdue appt with Dr. Curt Bears and keep appt  before anymore refills. Final Attempt 03/30/19  Yes [name], Fransisca Kaufmann, MD  clopidogrel (PLAVIX) 75 MG tablet TAKE 1 TABLET BY MOUTH EVERY DAY IN THE EVENING 03/30/19  Yes [name], Fransisca Kaufmann, MD  DULoxetine (CYMBALTA) 60 MG capsule Take 1 capsule (60 mg total) by mouth daily. 09/27/19  Yes [name], Fransisca Kaufmann, MD  glucose blood (ONETOUCH VERIO) test strip 1 each by Other route in the morning, at noon, in the evening, and at bedtime. use for testing 06/30/19  Yes [name], Fransisca Kaufmann, MD  ibuprofen (ADVIL,MOTRIN) 200 MG tablet Take 200 mg by mouth every 6 (six) hours as needed for moderate pain.   Yes [provider]  insulin degludec (TRESIBA FLEXTOUCH) 200 UNIT/ML FlexTouch Pen Inject 116 Units into the skin daily. Patient taking differently: Inject 160 Units into the skin daily.  09/27/19  Yes [name], Fransisca Kaufmann, MD  Insulin Pen Needle (B-D ULTRAFINE III SHORT PEN) 31G X 8 MM MISC USE TO INJECT INSULIN DAILY DX E11.9 08/06/18  Yes [name], Fransisca Kaufmann, MD  lisinopril (ZESTRIL) 2.5 MG tablet Take 1 tablet (2.5 mg total) by mouth daily. 03/30/19  Yes [name],  Fransisca Kaufmann, MD  metFORMIN (GLUCOPHAGE) 500 MG tablet Take 1 tablet (500 mg total) by mouth 2 (two) times daily. 03/30/19  Yes [name], Fransisca Kaufmann, MD  mirtazapine (REMERON) 7.5 MG tablet TAKE 1 TABLET AT BEDTIME FOR DEPRESSION 03/30/19  Yes [name], Fransisca Kaufmann, MD  OneTouch Delica Lancets 63F MISC Test blood sugars three times daily 09/30/19  Yes [name], Fransisca Kaufmann, MD  potassium chloride SA (KLOR-CON M20) 20 MEQ tablet Take 1 tablet (20 mEq total) by mouth daily. 07/01/18  Yes [name], Fransisca Kaufmann, MD  primidone (MYSOLINE) 50 MG tablet TAKE 1 TABLET BY MOUTH EVERYDAY AT BEDTIME Patient taking differently: Take 50 mg by mouth at bedtime.  04/05/18  Yes Tat, Eustace Quail, DO  Semaglutide,0.25 or 0.5MG/DOS, (OZEMPIC, 0.25 OR 0.5 MG/DOSE,) 2 MG/1.5ML SOPN Inject 0.5 mg into the skin once a week. Patient taking differently: Inject 0.5 mg into the  skin every 14 (fourteen) days.  09/27/19  Yes [name], Fransisca Kaufmann, MD  tamsulosin (FLOMAX) 0.4 MG CAPS capsule TAKE 1 CAPSULE DAILY IN THE EVENING 03/30/19  Yes [name], Fransisca Kaufmann, MD  topiramate (TOPAMAX) 25 MG tablet Take 1 tablet (25 mg total) by mouth at bedtime as needed. 09/27/19  Yes [name], Fransisca Kaufmann, MD  torsemide (DEMADEX) 20 MG tablet Take 1 tablet (20 mg total) by mouth daily. (Needs to be seen 03/30/19  Yes [name], Fransisca Kaufmann, MD  Alirocumab (PRALUENT) 150 MG/ML SOAJ Inject 150 mg into the skin every 14 (fourteen) days. Patient not taking: Reported on 09/27/2019 05/23/19   Lorretta Harp, MD  nitroGLYCERIN (NITROSTAT) 0.4 MG SL tablet PLACE 1 TABLET (0.4 MG TOTAL) UNDER THE TONGUE EVERY 5 (FIVE) MINUTES AS NEEDED FOR CHEST PAIN. 11/25/18   [name], Fransisca Kaufmann, MD    Physical Exam: Vitals:   01/22/20 1730 01/22/20 1800 01/22/20 1830 01/22/20 1900  BP: (!) 131/92 139/71 128/80 118/86  Pulse: 95   95  Resp: (!) 28 (!) 27 (!) 30 (!) 32  Temp:      TempSrc:      SpO2: 95%   96%  Weight:      Height:        Vitals:   01/22/20 1730 01/22/20 1800 01/22/20 1830 01/22/20 1900  BP: (!) 131/92 139/71 128/80 118/86  Pulse: 95   95  Resp: (!) 28 (!) 27 (!) 30 (!) 32  Temp:      TempSrc:      SpO2: 95%   96%  Weight:      Height:       General: deconditioned, positive dyspnea at rest.  Neurology: Awake and alert, non focal Head and Neck. Head normocephalic. Neck supple with no adenopathy or thyromegaly.   E ENT: mild pallor, no icterus, oral mucosa moist Cardiovascular: mild to moderate JVD. S1-S2 present, rhythmic, no gallops, rubs, or murmurs. +/++ pitting bilateral lower extremity edema. Pulmonary: positive breath sounds bilaterally, decreased air movement, but no wheezing,or rhonchi. Positive scattered rales. Gastrointestinal. Abdomen soft and non tender. Skin. No rashes Musculoskeletal: no joint deformities    Labs on Admission: I have personally reviewed following  labs and imaging studies  CBC: Recent Labs  Lab 01/22/20 1614  WBC 11.5*  NEUTROABS 8.3*  HGB 14.7  HCT 45.5  MCV 87.8  PLT 354   Basic Metabolic Panel: Recent Labs  Lab 01/22/20 1614  NA 128*  K 5.5*  CL 97*  CO2 22  GLUCOSE 570*  BUN 24*  CREATININE 1.03  CALCIUM 8.3*  GFR: Estimated Creatinine Clearance: 94 mL/min (by C-G formula based on SCr of 1.03 mg/dL). Liver Function Tests: Recent Labs  Lab 01/22/20 1614  AST 28  ALT 38  ALKPHOS 92  BILITOT 0.6  PROT 7.2  ALBUMIN 3.3*   No results for input(s): LIPASE, AMYLASE in the last 168 hours. No results for input(s): AMMONIA in the last 168 hours. Coagulation Profile: No results for input(s): INR, PROTIME in the last 168 hours. Cardiac Enzymes: No results for input(s): CKTOTAL, CKMB, CKMBINDEX, TROPONINI in the last 168 hours. BNP (last 3 results) No results for input(s): PROBNP in the last 8760 hours. HbA1C: No results for input(s): HGBA1C in the last 72 hours. CBG: Recent Labs  Lab 01/22/20 1606  GLUCAP >600*   Lipid Profile: No results for input(s): CHOL, HDL, LDLCALC, TRIG, CHOLHDL, LDLDIRECT in the last 72 hours. Thyroid Function Tests: No results for input(s): TSH, T4TOTAL, FREET4, T3FREE, THYROIDAB in the last 72 hours. Anemia Panel: No results for input(s): VITAMINB12, FOLATE, FERRITIN, TIBC, IRON, RETICCTPCT in the last 72 hours. Urine analysis:    Component Value Date/Time   COLORURINE YELLOW 05/20/2017 1330   APPEARANCEUR HAZY (A) 05/20/2017 1330   LABSPEC 1.017 05/20/2017 1330   PHURINE 5.0 05/20/2017 1330   GLUCOSEU 50 (A) 05/20/2017 1330   HGBUR NEGATIVE 05/20/2017 1330   BILIRUBINUR NEGATIVE 05/20/2017 1330   KETONESUR NEGATIVE 05/20/2017 1330   PROTEINUR 30 (A) 05/20/2017 1330   UROBILINOGEN 0.2 08/19/2011 2051   NITRITE NEGATIVE 05/20/2017 1330   LEUKOCYTESUR NEGATIVE 05/20/2017 1330    Radiological Exams on Admission: DG Chest Portable 1 View  Result Date:  01/22/2020 CLINICAL DATA:  54 year old male with shortness of breath for 4 days. Recently tested negative for COVID-19. EXAM: PORTABLE CHEST 1 VIEW COMPARISON:  Portable chest 05/20/2017 and earlier. FINDINGS: Portable AP upright view at 1628 hours. Perihilar and basilar predominant bilateral increased interstitial markings. Veiling opacity at the left lung base compatible with small pleural effusion. Possible trace right pleural effusion. No pneumothorax. Mediastinal contours remain normal. Chronic left chest cardiac AICD. Chronic calcified granulomas in the right lung. Visualized tracheal air column is within normal limits. No acute osseous abnormality identified. IMPRESSION: Increased bilateral pulmonary interstitial markings and left pleural effusion. Favor acute pulmonary edema. Electronically Signed   By: Genevie Ann M.D.   On: 01/22/2020 16:37    EKG: Independently reviewed.  97 bpm, normal axis, normal intervals, sinus rhythm, J-point elevation V1 through V3, Q wave V1-V2, clearly inversions lead I aVL.  Compared to prior EKG today's 12-lead has more prominent J-point elevations in the precordial leads.  Assessment/Plan Principal Problem:   Heart failure (HCC) Active Problems:   Essential hypertension   Coronary artery disease involving native coronary artery of native heart with angina pectoris (HCC)   Diabetes mellitus with complication (HCC)   Ischemic cardiomyopathy   Anxiety and depression   Hyperlipidemia   NSTEMI (non-ST elevated myocardial infarction) ([place])   54 year old male with multiple medical problems and high cardiovascular risk factors who presents with 4 days of worsening dyspnea, PND, orthopnea, lower extremity edema and chest pain.  He had stopped taking his medications few months ago.  On his initial physical examination he does have signs of volume overload.  Blood pressure 139/120, heart rate 98, respiratory rate 29, oxygen saturation 100% on 3 L per nasal cannula, moderate  JVD, scattered rales, his abdomen is soft, positive pitting bilateral lower extremity edema.  Sodium 128, potassium 5.5 chloride 97, bicarb 22, glucose  507, BUN 20, creatinine 1.0, anion gap 9, BNP 788, troponin I 1,013- 886. Wbc 11.5. Hgb 14.7, Hct 45.5. Plt 207. sars covid 19 negative.  Chest radiograph with bilateral interstitial infiltrates, hilar vascular congestion and left pleural effusion.  AICD in place.  Patient will be admitted to be telemetry ward with a working diagnosis of decompensated systolic heart failure, complicated by acute cardiogenic pulmonary edema and non-ST elevation myocardial infarction.  1.  Acute decompensated systolic heart failure, complicated by acute cardiogenic pulmonary edema (acute hypoxic respiratory failure).   Patient admitted to the telemetry ward, continue aggressive diuresis with intravenous furosemide, 60 mg twice daily, to target a negative fluid balance.  Continue carvedilol, and lisinopril.  Follow-up on echocardiography.  Strict ins and outs and daily weights. Continue supplemental oxygen per nasal cannula, target oxygen saturation more than 92%.  2.  Non-ST elevation myocardial infarction.  Continue medical therapy with heparin drip, patient on dual antiplatelet therapy with aspirin and clopidogrel, continue statin, beta-blockade and ACE inhibitor. As needed sublingual nitroglycerin.  Patient currently with no active chest pain and his troponin is trending down.  3.  Uncontrolled type 2 diabetes mellitus, severe hyperglycemia/dyslipidemia..  Patient at home on insulin degludec 116 units daily. For now we will add a basal dose of 10 units of insulin glargine bid and insulin sliding scale, further adjustments depending on response. Hold on Metformin.  Continue on atorvastatin.  4.  Hyperkalemia and pseudohyponatremia.  Corrected sodium for glucose is 138.  Patient will received aggressive diuresis, expected to drive potassium levels  down.  Patient critically ill with decompensated heart failure and ischemic heart disease.  Continue aggressive medical therapy to prevent acute decompensation  Critical care time 60 minutes.  Status is: Inpatient  Remains inpatient appropriate because:IV treatments appropriate due to intensity of illness or inability to take PO   Dispo: The patient is from: Home              Anticipated d/c is to: Home              Anticipated d/c date is: 3 days              Patient currently is not medically stable to d/c.   DVT prophylaxis: Heparin   Code Status:   full  Family Communication:  I spoke with patient's wife at the bedside, we talked in detail about patient's condition, plan of care and prognosis and all questions were addressed.     Consults called:  Cardiology   Admission status:  Inpatient    Douglas Milbrath Gerome Apley MD [facility]   01/22/2020, 8:07 PM

## 2020-01-22 NOTE — ED Triage Notes (Signed)
Pt brought in by [facility] for c/o hyperglycemia; pt states he has been feeling bad for the last 4 days and his machine at home has been reading high; pt c/o sob, denies any covid exposure; pt states he took a test from [facility] and it was negative

## 2020-01-23 ENCOUNTER — Inpatient Hospital Stay: Payer: Self-pay

## 2020-01-23 ENCOUNTER — Encounter (HOSPITAL_COMMUNITY): Payer: Self-pay | Admitting: Internal Medicine

## 2020-01-23 ENCOUNTER — Inpatient Hospital Stay (HOSPITAL_COMMUNITY): Payer: Medicare Other

## 2020-01-23 DIAGNOSIS — E785 Hyperlipidemia, unspecified: Secondary | ICD-10-CM

## 2020-01-23 DIAGNOSIS — R0600 Dyspnea, unspecified: Secondary | ICD-10-CM

## 2020-01-23 DIAGNOSIS — I10 Essential (primary) hypertension: Secondary | ICD-10-CM

## 2020-01-23 DIAGNOSIS — E782 Mixed hyperlipidemia: Secondary | ICD-10-CM

## 2020-01-23 DIAGNOSIS — I213 ST elevation (STEMI) myocardial infarction of unspecified site: Secondary | ICD-10-CM

## 2020-01-23 DIAGNOSIS — E119 Type 2 diabetes mellitus without complications: Secondary | ICD-10-CM

## 2020-01-23 DIAGNOSIS — I214 Non-ST elevation (NSTEMI) myocardial infarction: Principal | ICD-10-CM

## 2020-01-23 DIAGNOSIS — I959 Hypotension, unspecified: Secondary | ICD-10-CM

## 2020-01-23 DIAGNOSIS — J810 Acute pulmonary edema: Secondary | ICD-10-CM

## 2020-01-23 DIAGNOSIS — I255 Ischemic cardiomyopathy: Secondary | ICD-10-CM

## 2020-01-23 DIAGNOSIS — I5023 Acute on chronic systolic (congestive) heart failure: Secondary | ICD-10-CM

## 2020-01-23 DIAGNOSIS — I340 Nonrheumatic mitral (valve) insufficiency: Secondary | ICD-10-CM

## 2020-01-23 LAB — ECHOCARDIOGRAM COMPLETE
AR max vel: 2.18 cm2
AV Area VTI: 2.01 cm2
AV Area mean vel: 1.99 cm2
AV Mean grad: 1.5 mmHg
AV Peak grad: 2.4 mmHg
Ao pk vel: 0.78 m/s
Area-P 1/2: 4.17 cm2
Height: 70 in
MV M vel: 3.85 m/s
MV Peak grad: 59.3 mmHg
S' Lateral: 4.98 cm
Weight: 3200 oz

## 2020-01-23 LAB — BASIC METABOLIC PANEL
Anion gap: 8 (ref 5–15)
BUN: 26 mg/dL — ABNORMAL HIGH (ref 6–20)
CO2: 24 mmol/L (ref 22–32)
Calcium: 8.2 mg/dL — ABNORMAL LOW (ref 8.9–10.3)
Chloride: 101 mmol/L (ref 98–111)
Creatinine, Ser: 0.91 mg/dL (ref 0.61–1.24)
GFR calc Af Amer: >60 mL/min (ref >60)
GFR calc non Af Amer: >60 mL/min (ref >60)
Glucose, Bld: 244 mg/dL — ABNORMAL HIGH (ref 70–99)
Potassium: 4 mmol/L (ref 3.5–5.1)
Sodium: 133 mmol/L — ABNORMAL LOW (ref 135–145)

## 2020-01-23 LAB — HEMOGLOBIN A1C
Hgb A1c MFr Bld: 14 % — ABNORMAL HIGH (ref 4.8–5.6)
Mean Plasma Glucose: 355.1 mg/dL

## 2020-01-23 LAB — CBC
HCT: 40.3 % (ref 39.0–52.0)
Hemoglobin: 13 g/dL (ref 13.0–17.0)
MCH: 28.3 pg (ref 26.0–34.0)
MCHC: 32.3 g/dL (ref 30.0–36.0)
MCV: 87.8 fL (ref 80.0–100.0)
Platelets: 179 10*3/uL (ref 150–400)
RBC: 4.59 MIL/uL (ref 4.22–5.81)
RDW: 12.7 % (ref 11.5–15.5)
WBC: 9.9 10*3/uL (ref 4.0–10.5)
nRBC: 0 % (ref 0.0–0.2)

## 2020-01-23 LAB — HEMOGLOBIN AND HEMATOCRIT, BLOOD
HCT: 39.4 % (ref 39.0–52.0)
Hemoglobin: 12.5 g/dL — ABNORMAL LOW (ref 13.0–17.0)

## 2020-01-23 LAB — HIV ANTIBODY (ROUTINE TESTING W REFLEX): HIV Screen 4th Generation wRfx: NONREACTIVE

## 2020-01-23 LAB — TROPONIN I (HIGH SENSITIVITY)
Troponin I (High Sensitivity): 1025 ng/L (ref <18)
Troponin I (High Sensitivity): 1056 ng/L (ref <18)
Troponin I (High Sensitivity): 1061 ng/L (ref <18)
Troponin I (High Sensitivity): 860 ng/L (ref <18)

## 2020-01-23 LAB — HEPARIN LEVEL (UNFRACTIONATED)
Heparin Unfractionated: 0.13 IU/mL — ABNORMAL LOW (ref 0.30–0.70)
Heparin Unfractionated: 0.24 IU/mL — ABNORMAL LOW (ref 0.30–0.70)
Heparin Unfractionated: 0.38 IU/mL (ref 0.30–0.70)

## 2020-01-23 LAB — GLUCOSE, CAPILLARY
Glucose-Capillary: 85 mg/dL (ref 70–99)
Glucose-Capillary: 111 mg/dL — ABNORMAL HIGH (ref 70–99)

## 2020-01-23 LAB — CBG MONITORING, ED
Glucose-Capillary: 120 mg/dL — ABNORMAL HIGH (ref 70–99)
Glucose-Capillary: 214 mg/dL — ABNORMAL HIGH (ref 70–99)

## 2020-01-23 MED ORDER — NOREPINEPHRINE 4 MG/250ML-% IV SOLN
0.0000 ug/min | INTRAVENOUS | Status: DC
  Administered 2020-01-23: 2 ug/min via INTRAVENOUS
  Filled 2020-01-23: qty 250

## 2020-01-23 MED ORDER — SODIUM CHLORIDE 0.9 % IV BOLUS
500.0000 mL | Freq: Once | INTRAVENOUS | Status: AC
  Administered 2020-01-23: 500 mL via INTRAVENOUS

## 2020-01-23 MED ORDER — INSULIN ASPART 100 UNIT/ML ~~LOC~~ SOLN: 0.0000 [IU] | SUBCUTANEOUS | Status: DC

## 2020-01-23 MED ORDER — SODIUM CHLORIDE 0.9% FLUSH
10.0000 mL | Freq: Two times a day (BID) | INTRAVENOUS | Status: DC
  Administered 2020-01-23 – 2020-01-27 (×8): 10 mL
  Administered 2020-01-28: 20 mL
  Administered 2020-01-29 (×2): 10 mL
  Administered 2020-01-30: 20 mL
  Administered 2020-01-30: 10 mL

## 2020-01-23 MED ORDER — INSULIN GLARGINE 100 UNIT/ML ~~LOC~~ SOLN
60.0000 [IU] | Freq: Every day | SUBCUTANEOUS | Status: DC
  Administered 2020-01-23: 60 [IU] via SUBCUTANEOUS
  Filled 2020-01-23 (×2): qty 0.6

## 2020-01-23 MED ORDER — CHLORHEXIDINE GLUCONATE CLOTH 2 % EX PADS
6.0000 | MEDICATED_PAD | Freq: Every day | CUTANEOUS | Status: DC
  Administered 2020-01-24 – 2020-01-25 (×2): 6 via TOPICAL

## 2020-01-23 MED ORDER — INSULIN DEGLUDEC 200 UNIT/ML ~~LOC~~ SOPN: 80.0000 [IU] | PEN_INJECTOR | Freq: Every day | SUBCUTANEOUS | Status: DC

## 2020-01-23 MED ORDER — SODIUM CHLORIDE 0.9% FLUSH
10.0000 mL | INTRAVENOUS | Status: DC | PRN
  Administered 2020-01-28: 10 mL

## 2020-01-23 MED ORDER — KETOROLAC TROMETHAMINE 15 MG/ML IJ SOLN
15.0000 mg | Freq: Once | INTRAMUSCULAR | Status: AC
  Administered 2020-01-23: 15 mg via INTRAVENOUS
  Filled 2020-01-23: qty 1

## 2020-01-23 MED ORDER — HEPARIN BOLUS VIA INFUSION
1000.0000 [IU] | Freq: Once | INTRAVENOUS | Status: AC
  Administered 2020-01-23: 1000 [IU] via INTRAVENOUS

## 2020-01-23 MED ORDER — DEXTROSE 50 % IV SOLN
1.0000 | Freq: Once | INTRAVENOUS | Status: AC | PRN
  Administered 2020-01-24: 25 mL via INTRAVENOUS
  Filled 2020-01-23: qty 50

## 2020-01-23 NOTE — Progress Notes (Signed)
ANTICOAGULATION CONSULT NOTE   Pharmacy Consult for Heparin Indication: chest pain/ACS   Assessment: Patient presented with shortness of breath and tachypneic. He has had chest pressure and labs show elevated troponin. Reviewed home meds and patient not on oral anticoagulants. Pharmacy to dose heparin. Initial heparin level 0.13 units/ml Goal of Therapy:  Heparin level 0.3-0.7 units/ml Monitor platelets by anticoagulation protocol: Yes   Plan:  Heparin bolus 1000 units and increase drip to 1300 units/hr Check heparin level ~ 6 hours after rate increase.  Thanks for allowing pharmacy to be a part of this patient's care.  Talbert Cage, PharmD Clinical Pharmacist 01/23/2020,2:03 AM

## 2020-01-23 NOTE — ED Notes (Signed)
Date and time results received: 01/23/20 0207   Test: TROPONIN Critical Value: 1025  Name of Provider Notified: Morton Peters, [place] DO

## 2020-01-23 NOTE — Consult Note (Addendum)
Cardiology Consultation:   Patient ID: Douglas Carr MRN: [id number]; DOB: 08/07/1965  Admit date: 01/22/2020 Date of Consult: 01/23/2020  Primary Care Provider: [name], Fransisca Kaufmann, MD [facility] Cardiologist: Quay Burow, MD  [facility] Electrophysiologist:  Will Meredith Leeds, MD    Patient Profile:   Douglas Carr is a 54 y.o. male with a hx of ICM, CAD (MI at age 43), HTN, HLD (followed by Dr. Debara Pickett) and diabetes-2, and ICD who is being seen today for the evaluation of NSTEMI and acute CHF at the request of Dr. Benny Lennert.  History of Present Illness:   Douglas Carr has hx of MI at age 39, with occluded vessels and collaterals, medical therapy.  In 2010 cath without significant findings.   STEMI 05/07/2016 with 2nd OM received DES.  Then ICM with EF 30%-diuresed.  05/24/16 with cardiac arrest with rapid ROSC emergent cath with 90% stenosis at the origin of the previous stented vessel with 70% diffuse proximal AV groove circumflex stenosis which was stented by Dr. Ellyn Hack.  Prolonged hospital course.     He did have ICD placed with Dr. Curt Bears for secondary prevention of sudden death.  Last remote device check 07/12/19 with normal device function.   His lipids were uncontrolled and was referred to Dr. Debara Pickett.  Recommended to resume statin and Praluent.     Hx of Rt CEA and subsequent occlusion.  Last dopplers 07/20/19  Rt ICA with total occlusion ECA occluded.  Lt carotid with 40-59% stenosis - stable Bilateral vertebral arteries demonstrate an occlusion Bilateral subclavian arteries were occluded. Left subclavian artery flow was disturbed. Normal flow hemodynamics were seen in the right subclavian artery.   Pt presented 01/22/20 with hyperglycemia.  Had felt bad for 4 days -glucose was 570.  Also with dyspnea, progressive over 4 days + chest pain/tightness over 4 days but increased last pm.  No edema Pt had stopped medications a few months ago - no particular reason.  Not  been taking his insulin.    EKG:  The EKG was personally reviewed and demonstrates:  SR with LBBB new LBBB Telemetry:  Telemetry was personally reviewed and demonstrates:  SR  Troponin hs 1013, 886, 880, 1025; 1056  BNP 788 HGB A1C 14  HGB 13, plts 179 WBC 9.9 Na 133, K+ 4.0, BUN 26 and Cr 0.91  PCXR 1st:  IMPRESSION: Increased bilateral pulmonary interstitial markings and left pleural effusion. Favor acute pulmonary edema PCXR second:  IMPRESSION: Stable moderate perihilar interstitial pulmonary edema, possibly cardiogenic in nature. Increasing, moderate, partially laterally loculated left pleural Effusion  Currently on IV lasix rec'd 40 then 60 mg. and IV heparin.  Has rec'd po asa He tells me he feels better now. Can breathe better. Can take a deep breath.   BP 108/58 now - placed on coreg 6.25 mg BID with acute HF will decrease to 3.125 mg BID, he has not had recently.    Past Medical History:  Diagnosis Date  . Acute ST elevation myocardial infarction (STEMI) involving left circumflex coronary artery ([place]) 05/07/2016   PCI to Cx-OM  . Anginal pain ([place])    secondary to sm. vessel disease  . Anxiety   . Arthritis    BIL KNEE PAIN AND BIL ANKLE PAIN  . Bone spur of ankle   . Cardiac arrest ([place]) 05/24/2016   with v fib  . 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 Coronary artery disease involving  native coronary artery of native heart with angina pectoris ([place]) 05/24/2016   Remote MI at 54 years of age, Last cath 2010-diffuse non-obstructive disease; last echo 06/16/08 -normal LV function, moderate concentric hypertrophy; nuc 08/2008 no ischemia;  medical therapy; STEMI May 07 2016 - PCI to Cx-OM  . Diabetes mellitus    ON ORAL MEDICATION AND INSULIN  . Dilated cardiomyopathy ([place]) 05/24/2016   EF 325-30% by Echo post STEMI (previously 30-35%)   . Hyperlipidemia   . Hypertension   . Myocardial infarction ([place]) 1996    Post MI  . Peripheral vascular disease ([place])    HAS LEFT CAROTID ARTERY STENOSIS   AND IS S/P RIGHT CAROTID ENDARTERECTOMY 2010 Last carotid dopplers 01/08/2012 wth patent endarterectomy site  . Status post coronary artery stent placement     Past Surgical History:  Procedure Laterality Date  . APPENDECTOMY    . CARDIAC CATHETERIZATION     FEB 2010, significant Nysha Koplin vessel disease wth diag, marginal, PDA & PLA, nml. LV function  . CARDIAC CATHETERIZATION N/A 05/07/2016   Procedure: Left Heart Cath and Coronary Angiography;  Surgeon: Peter M Martinique, MD;  Location: [place] [facility];  Service: Cardiovascular;  Laterality: N/A;  . CARDIAC CATHETERIZATION N/A 05/07/2016   Procedure: Coronary Stent Intervention;  Surgeon: Peter M Martinique, MD;  Location: [place] [facility];  Service: Cardiovascular;  Laterality: N/A;  . CARDIAC CATHETERIZATION N/A 05/24/2016   Procedure: Left Heart Cath and Coronary Angiography;  Surgeon: [name], MD;  Location: [place] [facility];  Service: Cardiovascular;  Laterality: N/A;  . CARDIAC CATHETERIZATION N/A 05/24/2016   Procedure: Coronary Stent Intervention;  Surgeon: [name], MD;  Location: [place] [facility];  Service: Cardiovascular;  Laterality: N/A;  2.5x20 Promus to Ostial/proximal circumflex  . CAROTID ENDARTERECTOMY  09/2008   Rt CEA  . ICD IMPLANT N/A 06/30/2016   Procedure: ICD Implant;  Surgeon: Will Meredith Leeds, MD;  Location: [place] [facility];  Service: Cardiovascular;  Laterality: N/A;  . LUNG BIOPSY  2013   Bx's suggest granulomatous dz.  . RT ANKLE   2013     Home Medications:  Prior to Admission medications   Medication Sig Start Date End Date Taking? Authorizing Provider  aspirin EC 81 MG tablet Take 81 mg by mouth daily.   Yes [provider]  atorvastatin (LIPITOR) 80 MG tablet Take 1 tablet (80 mg total) by mouth daily. 03/30/19  Yes [name], Fransisca Kaufmann, MD  blood glucose meter kit and supplies KIT Inject 1 each  into the skin 4 (four) times daily as needed. Dispense based on patient and insurance preference. Use up to four times daily as directed. (FOR ICD-9 250.00, 250.01). 06/30/19  Yes [name], Fransisca Kaufmann, MD  carvedilol (COREG) 25 MG tablet Take 1 tablet (25 mg total) by mouth 2 (two) times daily. Please make overdue appt with Dr. Curt Bears and keep appt before anymore refills. Final Attempt 03/30/19  Yes [name], Fransisca Kaufmann, MD  clopidogrel (PLAVIX) 75 MG tablet TAKE 1 TABLET BY MOUTH EVERY DAY IN THE EVENING 03/30/19  Yes [name], Fransisca Kaufmann, MD  DULoxetine (CYMBALTA) 60 MG capsule Take 1 capsule (60 mg total) by mouth daily. 09/27/19  Yes [name], Fransisca Kaufmann, MD  glucose blood (ONETOUCH VERIO) test strip 1 each by Other route in the morning, at noon, in the evening, and at bedtime. use for testing 06/30/19  Yes [name], Fransisca Kaufmann, MD  ibuprofen (ADVIL,MOTRIN) 200 MG tablet Take 200  mg by mouth every 6 (six) hours as needed for moderate pain.   Yes [provider]  insulin degludec (TRESIBA FLEXTOUCH) 200 UNIT/ML FlexTouch Pen Inject 116 Units into the skin daily. Patient taking differently: Inject 160 Units into the skin daily.  09/27/19  Yes [name], Fransisca Kaufmann, MD  Insulin Pen Needle (B-D ULTRAFINE III SHORT PEN) 31G X 8 MM MISC USE TO INJECT INSULIN DAILY DX E11.9 08/06/18  Yes [name], Fransisca Kaufmann, MD  lisinopril (ZESTRIL) 2.5 MG tablet Take 1 tablet (2.5 mg total) by mouth daily. 03/30/19  Yes [name], Fransisca Kaufmann, MD  metFORMIN (GLUCOPHAGE) 500 MG tablet Take 1 tablet (500 mg total) by mouth 2 (two) times daily. 03/30/19  Yes [name], Fransisca Kaufmann, MD  mirtazapine (REMERON) 7.5 MG tablet TAKE 1 TABLET AT BEDTIME FOR DEPRESSION 03/30/19  Yes [name], Fransisca Kaufmann, MD  OneTouch Delica Lancets 02T MISC Test blood sugars three times daily 09/30/19  Yes [name], Fransisca Kaufmann, MD  potassium chloride SA (KLOR-CON M20) 20 MEQ tablet Take 1 tablet (20 mEq total) by mouth daily. 07/01/18  Yes [name], Fransisca Kaufmann, MD   primidone (MYSOLINE) 50 MG tablet TAKE 1 TABLET BY MOUTH EVERYDAY AT BEDTIME Patient taking differently: Take 50 mg by mouth at bedtime.  04/05/18  Yes Tat, Eustace Quail, DO  Semaglutide,0.25 or 0.5MG/DOS, (OZEMPIC, 0.25 OR 0.5 MG/DOSE,) 2 MG/1.5ML SOPN Inject 0.5 mg into the skin once a week. Patient taking differently: Inject 0.5 mg into the skin every 14 (fourteen) days.  09/27/19  Yes [name], Fransisca Kaufmann, MD  tamsulosin (FLOMAX) 0.4 MG CAPS capsule TAKE 1 CAPSULE DAILY IN THE EVENING 03/30/19  Yes [name], Fransisca Kaufmann, MD  topiramate (TOPAMAX) 25 MG tablet Take 1 tablet (25 mg total) by mouth at bedtime as needed. 09/27/19  Yes [name], Fransisca Kaufmann, MD  torsemide (DEMADEX) 20 MG tablet Take 1 tablet (20 mg total) by mouth daily. (Needs to be seen 03/30/19  Yes [name], Fransisca Kaufmann, MD  Alirocumab (PRALUENT) 150 MG/ML SOAJ Inject 150 mg into the skin every 14 (fourteen) days. Patient not taking: Reported on 09/27/2019 05/23/19   Lorretta Harp, MD  nitroGLYCERIN (NITROSTAT) 0.4 MG SL tablet PLACE 1 TABLET (0.4 MG TOTAL) UNDER THE TONGUE EVERY 5 (FIVE) MINUTES AS NEEDED FOR CHEST PAIN. 11/25/18   [name], Fransisca Kaufmann, MD    Inpatient Medications: Scheduled Meds: . aspirin EC  81 mg Oral Daily  . atorvastatin  80 mg Oral Daily  . clopidogrel  75 mg Oral Daily  . DULoxetine  60 mg Oral Daily  . furosemide  40 mg Intramuscular Once  . furosemide  60 mg Intravenous Q12H  . insulin aspart  0-15 Units Subcutaneous TID WC  . insulin glargine  10 Units Subcutaneous BID  . mirtazapine  7.5 mg Oral QHS  . primidone  50 mg Oral QHS  . sodium chloride flush  3 mL Intravenous Q12H  . tamsulosin  0.4 mg Oral QPC supper   Continuous Infusions: . sodium chloride    . heparin 1,300 Units/hr (01/23/20 0226)  . norepinephrine (LEVOPHED) Adult infusion 3 mcg/min (01/23/20 0834)   PRN Meds: sodium chloride, acetaminophen, morphine injection, nitroGLYCERIN, ondansetron (ZOFRAN) IV, sodium chloride  flush  Allergies:   No Known Allergies  Social History:   Social History   Socioeconomic History  . Marital status: Married    Spouse name: [name]  . Number of children: 3  . Years of education: Not on file  . Highest education level: Not  on file  Occupational History  . Occupation: Agricultural consultant for [employer]    Employer: [employer]   Tobacco Use  . Smoking status: Former Smoker    Packs/day: 2.00    Years: 22.00    Pack years: 44.00    Types: Cigarettes    Quit date: 10/23/2011    Years since quitting: 8.2  . Smokeless tobacco: Current User    Types: Snuff  Vaping Use  . Vaping Use: Never used  Substance and Sexual Activity  . Alcohol use: No  . Drug use: No  . Sexual activity: Not on file  Other Topics Concern  . Not on file  Social History Narrative   Pt lives with family in [place], [place].   Social Determinants of Health   Financial Resource Strain:   . Difficulty of Paying Living Expenses: Not on file  Food Insecurity:   . Worried About Charity fundraiser in the Last Year: Not on file  . Ran Out of Food in the Last Year: Not on file  Transportation Needs:   . Lack of Transportation (Medical): Not on file  . Lack of Transportation (Non-Medical): Not on file  Physical Activity:   . Days of Exercise per Week: Not on file  . Minutes of Exercise per Session: Not on file  Stress:   . Feeling of Stress : Not on file  Social Connections:   . Frequency of Communication with Friends and Family: Not on file  . Frequency of Social Gatherings with Friends and Family: Not on file  . Attends Religious Services: Not on file  . Active Member of Clubs or Organizations: Not on file  . Attends Archivist Meetings: Not on file  . Marital Status: Not on file  Intimate Partner Violence:   . Fear of Current or Ex-Partner: Not on file  . Emotionally Abused: Not on file  . Physically Abused: Not on file  . Sexually Abused: Not on file    Family History:    Family History   Problem Relation Age of Onset  . Hypertension Mother   . Diabetes Mother   . Hypertension Maternal Grandfather   . Heart attack Paternal Grandfather   . Heart attack Father 48     ROS:  Please see the history of present illness.  General:no colds or fevers, no weight changes Skin:no rashes or ulcers HEENT:no blurred vision, no congestion CV:see HPI PUL:see HPI GI:no diarrhea constipation or melena, no indigestion GU:no hematuria, no dysuria MS:no joint pain, no claudication Neuro:no syncope, + lightheadedness Endo:+ diabetes, no thyroid disease  All other ROS reviewed and negative.     Physical Exam/Data:   Vitals:   01/23/20 0800 01/23/20 0815 01/23/20 0830 01/23/20 0845  BP: 97/66 110/71 (!) 105/54 (!) 108/58  Pulse: 66 65 69 72  Resp: 11 13 14 16   Temp:      TempSrc:      SpO2: 96% 93% 93% 94%  Weight:      Height:        Intake/Output Summary (Last 24 hours) at 01/23/2020 1009 Last data filed at 01/23/2020 0705 Gross per 24 hour  Intake 1000 ml  Output 2000 ml  Net -1000 ml   Last 3 Weights 01/22/2020 09/27/2019 07/26/2019  Weight (lbs) 200 lb 198 lb 4 oz 203 lb  Weight (kg) 90.719 kg 89.926 kg 92.08 kg     Body mass index is 28.7 kg/m.  General:  Well nourished, well developed, in no acute  distress now HEENT: normal Lymph: no adenopathy Neck: no JVD sitting up in bed Endocrine:  No thryomegaly Vascular: No carotid bruits; pedal pulses 2+ bilaterally  Cardiac:  normal S1, S2; RRR; no murmur gallup rub or click Lungs:  diminished to auscultation bilaterally, no wheezing, rhonchi occ rales  Abd: soft, nontender, no hepatomegaly  Ext: no edema Musculoskeletal:  No deformities, BUE and BLE strength normal and equal Skin: warm and dry  Neuro:  Alert and oriented X 3 MAE follows commands, no focal abnormalities noted Psych:  Normal affect    Relevant CV Studies: Echo completed.  cardiac cath 05/24/16  Ost Cx to Mid Cx lesion, 70 %stenosed leading into  OM2 as the main trunk of the Circumflex.  Ost 2nd Mrg to 2nd Mrg recent Promus DES 2.5 x 24 stent, - focal 90 %stenosed with what appears to be proximal edge dissection with thrombus  A STENT PROMUS PREM MR 2.5X20 drug eluting stent was successfully placed from Ostium of Circumflex into OM2, and overlaps previously placed stent.  Post intervention, there is a 0% residual stenosis.  ____________________________________________________  Colon Flattery LAD to Prox LAD lesion, 30 %stenosed.  Ost 1st Diag to 1st Diag lesion, 70 %stenosed.  Ost 1st Mrg to 1st Mrg lesion, 35 %stenosed. Ost 2nd Diag to 2nd Diag lesion, 50 %stenosed.  Prox RCA to Mid RCA lesion, 65 %stenosed - lesion appears similar to prior cath, with mild progression.  Very distal RPDA lesion, 100 %stenosed.  LV end diastolic pressure is severely elevated.  There is no aortic valve stenosis.    Status post what is most likely VF or VT arrest 2 weeks post STEMI with PCI to the circumflex.  His EKG itself did not show signs of ST elevation, however given his recent STEMI, known cardiac myopathy and presentation with cardiac arrest, I felt it prudent taken to the Cath Lab.  Angiographically there appeared to be a possible proximal edge dissection with thrombus in this lesion, however there is TIMI 2 flow. This lesion was treated with an overlapping proximal stent up to the ostium of the circumflex and OM 2. Angiographically there was good result. It is quite possible at least 1 and strut is into the left main, but non flow-limiting.  Plan:  He'll be admitted to the CCU on hypothermia protocol per PCCM  Continue amiodarone overnight as he appears to have converted into sinus rhythm  Hold ARB and Imdur for now. We'll try to start beta blocker.  Diagnostic Dominance: Right  Intervention     Laboratory Data:  High Sensitivity Troponin:   Recent Labs  Lab 01/22/20 1857 01/22/20 2221 01/23/20 0122 01/23/20 0415  01/23/20 0849  TROPONINIHS 886* 880* 1,025* 1,056* 1,061*     Chemistry Recent Labs  Lab 01/22/20 1614 01/23/20 0123  NA 128* 133*  K 5.5* 4.0  CL 97* 101  CO2 22 24  GLUCOSE 570* 244*  BUN 24* 26*  CREATININE 1.03 0.91  CALCIUM 8.3* 8.2*  GFRNONAA >60 >60  GFRAA >60 >60  ANIONGAP 9 8    Recent Labs  Lab 01/22/20 1614  PROT 7.2  ALBUMIN 3.3*  AST 28  ALT 38  ALKPHOS 92  BILITOT 0.6   Hematology Recent Labs  Lab 01/22/20 1614 01/23/20 0123 01/23/20 0706  WBC 11.5* 9.9  --   RBC 5.18 4.59  --   HGB 14.7 13.0 12.5*  HCT 45.5 40.3 39.4  MCV 87.8 87.8  --   MCH 28.4 28.3  --  MCHC 32.3 32.3  --   RDW 12.9 12.7  --   PLT 207 179  --    BNP Recent Labs  Lab 01/22/20 1614  BNP 788.0*    DDimer No results for input(s): DDIMER in the last 168 hours.   Radiology/Studies:  DG CHEST PORT 1 VIEW  Result Date: 01/23/2020 CLINICAL DATA:  Chest pain EXAM: PORTABLE CHEST 1 VIEW COMPARISON:  01/22/2020 FINDINGS: Lung volumes are slightly small, however, pulmonary insufflation remain stable since prior examination. Moderate left pleural effusion appears slightly enlarged, partially loculated laterally. Perihilar interstitial pulmonary edema persists, stable since prior examination, possibly cardiogenic in nature. Benign calcified granuloma again noted at the right lung base. No pneumothorax. Cardiac size within normal limits. Left subclavian pacemaker defibrillator unchanged. IMPRESSION: Stable moderate perihilar interstitial pulmonary edema, possibly cardiogenic in nature. Increasing, moderate, partially laterally loculated left pleural effusion. Electronically Signed   By: Fidela Salisbury MD   On: 01/23/2020 07:01   DG Chest Portable 1 View  Result Date: 01/22/2020 CLINICAL DATA:  54 year old male with shortness of breath for 4 days. Recently tested negative for COVID-19. EXAM: PORTABLE CHEST 1 VIEW COMPARISON:  Portable chest 05/20/2017 and earlier. FINDINGS: Portable AP  upright view at 1628 hours. Perihilar and basilar predominant bilateral increased interstitial markings. Veiling opacity at the left lung base compatible with small pleural effusion. Possible trace right pleural effusion. No pneumothorax. Mediastinal contours remain normal. Chronic left chest cardiac AICD. Chronic calcified granulomas in the right lung. Visualized tracheal air column is within normal limits. No acute osseous abnormality identified. IMPRESSION: Increased bilateral pulmonary interstitial markings and left pleural effusion. Favor acute pulmonary edema. Electronically Signed   By: Genevie Ann M.D.   On: 01/22/2020 16:37       TIMI Risk Score for Unstable Angina or Non-ST Elevation MI:   The patient's TIMI risk score is 5, which indicates a 26% risk of all cause mortality, new or recurrent myocardial infarction or need for urgent revascularization in the next 14 days.   New York Heart Association (NYHA) Functional Class NYHA Class III  Assessment and Plan:   1. NSTEMI with known CAD and chest discomfort associated with acute CHF.  - on IV heparin.  Transfer to [facility] to continue to diuresis and then cardiac cath.  troponins elevated.  Pt had been off medications. On ASA and Plavix now, lisinopril on hold.  2. Acute systolic HF with last Echo 2018 with EF 25-30%  Also with G2DD. Now with acute HF (BNP 788) and chest pain/NSTEMI --diuresis continue, he is neg 1000 cc.  Echo pending -decrease dose of BB as pt not been taking for sometime and now acute HF and BP low--coreg now stopped. 3. DM-2 insulin dependant with hyperglycemia on arrival per IM, A1C was 14 4. HLD on statin and pravulent continue lipitor 80 mg here and add pravulent as outpt. 5. HTN currently hypotension.  Now levophed has been started.        For questions or updates, please contact [place] Please consult [email or web address] for contact info under    Signed, Cecilie Kicks, NP  01/23/2020 10:09 AM   Patient discussed  and see with PA Dorene Ar, I agree with her documentation. 55 yo male history of ICM/chronic systolic HF, CAD. History of MI age 7, notes indicate "colluded vessels with collaterals and treated medically". STEMI in Jan 2018, received DES OM2. Shortly after discharge readmitted with cardiac arrest, cath showed 90% stenosis origin of previous stented vessel with 70%  diffuse proximal av groove circ disease that was stanted. ICD implanted. Medication compliance has been an issue.    Presents with SOB, chest pressure with laying flat. +cough with yellow sputum. Reports he has been off his meds for several months.    ER vitals p 97 bp 139/120 100% 3L [place] BG>600 WBC 11.5 Hgb 14.7 Plt 207 K 5.5 Cr 1.03 BUN 24 BNP 788 HgbA1c 14 COVID neg Trop 1013-->886-->880-->1025-->1056-->1061--> EKG SR, lateral ST depressions, anteroseptal Qwaves (chronic changes) CXR bilateral edema, mod left pleural effusion Echo LVEF 30%, global hypokinesis   Admitted with acute on chronic systolic HF, he had stopped taking his home meds. Pulm edema by CXR, BNP 788. Negative 1 L thus far. Diuretic currently on hold due to low bp's. Continue to hold at this time, likely resume tomorrow AM  In ER hypotensive around 5AM (received coreg 25, lasix 40 and 34m, lisinopril 2.598m morhphine 1 mg prior. Low dose levophed started at 720AM at 2 mcg/min, currently on 3 mcg/min. Was bolused tolat of 1L of NS early AM. I have asked nursing to wean levophed, if can get off will not need ICU bed. Have asked for PICC line placement in case vasopressor continue to be needed, can also check coox.   Elevated troponin initially trended down, now relatively flat around 1000. EKG with chronic ST/T changes. Echo with mild further decrease in LVEF, 35-40% in 2019 down to 30% today. No chest pain other than fullness with laying down, orthopnea. Unclear if drop in LVEF from medication noncompliance or recurrent obstructive CAD. Would anticipate cath once  more euvolemic. Continue hep gtt, asa, statin. Holding other meds due to low bp's  We will take [facility] to [facility], follow progress with weaning leveophed to determine if ICU or progressive status.   JoCarlyle DollyD

## 2020-01-23 NOTE — Progress Notes (Signed)
ANTICOAGULATION CONSULT NOTE - Follow-Up  Pharmacy Consult for Heparin Indication: chest pain/ACS  No Known Allergies  Patient Measurements: Height: 5\' 10"  (177.8 cm) Weight: 94.7 kg (208 lb 12.4 oz) IBW/kg (Calculated) : 73 HEPARIN DW (KG): 92.3  Vital Signs: Temp: 98 F (36.7 C) (09/20 1800) Temp Source: Oral (09/20 1800) BP: 122/70 (09/20 1845) Pulse Rate: 77 (09/20 1845)  Labs: Recent Labs    01/22/20 1614 01/22/20 1614 01/22/20 1638 01/23/20 0122 01/23/20 0122 01/23/20 0123 01/23/20 0415 01/23/20 0706 01/23/20 0849 01/23/20 1200 01/23/20 1901  HGB 14.7   < >  --   --   --  13.0  --  12.5*  --   --   --   HCT 45.5  --   --   --   --  40.3  --  39.4  --   --   --   PLT 207  --   --   --   --  179  --   --   --   --   --   HEPARINUNFRC  --   --   --  0.13*  --   --   --   --  0.24*  --  0.38  CREATININE 1.03  --   --   --   --  0.91  --   --   --   --   --   TROPONINIHS  --   --    < > 1,025*   < >  --  1,056*  --  1,061* 860*  --    < > = values in this interval not displayed.    Estimated Creatinine Clearance: 108.5 mL/min (by C-G formula based on SCr of 0.91 mg/dL).   Assessment: 21 YOM who presented on 9/19 with SOB and CP/NSTEMI. Transferred to [facility] for cardiac evaluation and cath. Pharmacy consulted for Heparin dosing.   The patient's heparin level this evening is therapeutic after a rate increase earlier today (HL 0.38 << 0.24, goal HL 0.3-0.7). No bleeding or issues noted per RN.   Goal of Therapy:  Heparin level 0.3-0.7 units/ml Monitor platelets by anticoagulation protocol: Yes   Plan:  - Continue Heparin at 1500 units/hr (15 ml/hr) - Will continue to monitor for any signs/symptoms of bleeding and will follow up with a confirmatory heparin level with AM labs  Thank you for allowing pharmacy to be a part of this patient's care.  [facility], PharmD, BCPS Clinical Pharmacist Clinical phone for 01/23/2020: [phone] 01/23/2020 7:58 PM    **Pharmacist phone directory can now be found on [email or web address] ([email or web address]).  Listed under [facility].

## 2020-01-23 NOTE — Progress Notes (Signed)
PROGRESS NOTE  Douglas Carr YIA:[id number] DOB: 09/25/1965 DOA: 01/22/2020 PCP: [name], [name], MD  Brief History   Douglas Carr is a 54 y.o. male with medical history significant of ischemic cardiomyopathy, systolic heart failure, type 2 diabetes mellitus, dyslipidemia, hypertension and anxiety. Patient reports not feeling well for about 4 days, mainly experiencing dyspnea, slowly progressive over the last 4 days, to the point where he is having symptoms with minimal efforts.  His dyspnea has been moderate initially and now severe in intensity, worse with exertion, no improving factors, associated with chest pain, orthopnea and lower extremity edema. Positive cough and wheezing.   Patient stopped taking his meds medications few months ago with no particular reason. At home his appetite was preserved and his glucose has been persistently elevated.  He has been not taking his insulin.  Today due to severe and persistent symptoms he was brought to the hospital by his family.   ED Course: Patient ill looking appearing, in distress, fluid overloaded, positive hyperglycemia with a very high elevated troponin.  His EKG had lateral T wave inversions and prominent J-point elevation V1 through V3.  Cardiology was consulted over phone, recommendations for medical management.  Overnight this patient had increased troponins into the low 1000's after an initial decrease. EKG was repeated at 0545 and demonstrated new LBBB. The patient developed hypotension and has required infusion of levophed. Cardiology was called this morning. Dr. Wyline Mood has asked that the patient be transferred to [facility]. I have placed a new admit order for an ICU bed given the patient's requirement for levophed.  Consultants  . Cardiology  Procedures  . None  Antibiotics   Anti-infectives (From admission, onward)   None    .  Subjective  The patient is resting on a gurney. He is  displaying abdominal breathing. He denies chest pain.   Objective   Vitals:  Vitals:   01/23/20 0830 01/23/20 0845  BP: (!) 105/54 (!) 108/58  Pulse: 69 72  Resp: 14 16  Temp:    SpO2: 93% 94%   Exam:  Constitutional:  The patient is awake, alert, and oriented x 3. Mild distress from increased work of breathing. Respiratory:  Marland Kitchen Mildly increased work of breathing. . No wheezes, rales, or rhonchi . No tactile fremitus Cardiovascular:  . Regular rate and rhythm . No murmurs, ectopy, or gallups. . No lateral PMI. No thrills. Abdomen:  . Abdomen is soft, non-tender, non-distended . No hernias, masses, or organomegaly . Normoactive bowel sounds.  Musculoskeletal:  . No cyanosis or clubbing . 3-4+ pitting edema of lower extremities bilaterally. Skin:  . No rashes, lesions, ulcers . palpation of skin: no induration or nodules Neurologic:  . CN 2-12 intact . Sensation all 4 extremities intact Psychiatric:  . Mental status o Mood, affect appropriate o Orientation to person, place, time  . judgment and insight appear intact  I have personally reviewed the following:   Today's Data  . Vitals, Troponin, Hemoglobin  Imaging  . CXR  Cardiology Data  . EKG . Echocardiogram  Scheduled Meds: . aspirin EC  81 mg Oral Daily  . atorvastatin  80 mg Oral Daily  . clopidogrel  75 mg Oral Daily  . DULoxetine  60 mg Oral Daily  . furosemide  40 mg Intramuscular Once  . furosemide  60 mg Intravenous Q12H  . heparin  1,000 Units Intravenous Once  . insulin aspart  0-15 Units Subcutaneous TID WC  . insulin  glargine  10 Units Subcutaneous BID  . mirtazapine  7.5 mg Oral QHS  . primidone  50 mg Oral QHS  . sodium chloride flush  3 mL Intravenous Q12H  . tamsulosin  0.4 mg Oral QPC supper   Continuous Infusions: . sodium chloride    . heparin 1,300 Units/hr (01/23/20 0226)  . norepinephrine (LEVOPHED) Adult infusion 3 mcg/min (01/23/20 0834)    Principal Problem:   Heart  failure (HCC) Active Problems:   Essential hypertension   Coronary artery disease involving native coronary artery of native heart with angina pectoris (HCC)   Diabetes mellitus with complication (HCC)   Ischemic cardiomyopathy   Anxiety and depression   Hyperlipidemia   NSTEMI (non-ST elevated myocardial infarction) (HCC)   STEMI (ST elevation myocardial infarction) (HCC)   LOS: 1 day   A & P  STEMI:  The patient is being evaluated by Dr. Wyline Mood. He has asked that the patient be transferred to [facility]. Pt has known coronary artery disease. He is on a heparin drip, ASA, Lipitor, and Levophed.  Pulmonary edema/CHF exacerbation/Cardiogenic shock: Diuretics were given overnight, but have now been stopped due to hypotension. Monitor volume status, blood pressures.  Hypotension: Cardiogenic shock vs combined effect of diuresis, morphine, coreg, and lisinopril. Levophed has been weaned down to 3. Cardiology feels that he will be able to be weaned off.  Known CAD with angina pectoris/Ischemic Cardiomyopathy: The patient will be transferred to [facility] to the cardiologists service for [facility].  DM II with vascular complications: At home the patient's DM is managed with Tresiba 160 units daily. As the patient is NPO I have reduced his dose to 60 units daily. His glucoses are also followed with FSBS and moderate SSI. Metformin held.  Hyperlipidemia: Pt is to be taking Praluent at home, but he has not been taking it. He is taking lipitor 80 mg daily.  Anxiety and depression: Continue Cymbalta and remeron as at home.  I have seen and examined this patient myself. I have spent 52 minutes in his evaluation and care. More than 50% of this time has been spent in coordination of care with cardiology and my colleagues at [facility].  DVT prophylaxis: Pt is on a heparin drip CODE STATUS: Full Code Family Communication: None available Disposition: Pt will be transferred to the  cardiology/STEMI service at [facility] when a bed becomes available. Status is: Inpatient  Remains inpatient appropriate because:Inpatient level of care appropriate due to severity of illness   Dispo: The patient is from: Home              Anticipated d/c is to: tbd              Anticipated d/c date is: 3 days              Patient currently is not medically stable to d/c.  Xiomara Sevillano, DO [facility] Direct contact: see [email or web address]  7PM-7AM contact night coverage as above 01/23/2020, 11:02 AM  LOS: 1 day

## 2020-01-23 NOTE — Progress Notes (Signed)
Peripherally Inserted Central Catheter Placement  The IV Nurse has discussed with the patient and/or persons authorized to consent for the patient, the purpose of this procedure and the potential benefits and risks involved with this procedure.  The benefits include less needle sticks, lab draws from the catheter, and the patient may be discharged home with the catheter. Risks include, but not limited to, infection, bleeding, blood clot (thrombus formation), and puncture of an artery; nerve damage and irregular heartbeat and possibility to perform a PICC exchange if needed/ordered by physician.  Alternatives to this procedure were also discussed.  Bard Power PICC patient education guide, fact sheet on infection prevention and patient information card has been provided to patient /or left at bedside.    PICC Placement Documentation  PICC Double Lumen 01/23/20 PICC Right 43 cm 0 cm (Active)  Indication for Insertion or Continuance of Line Chronic illness with exacerbations (CF, Sickle Cell, etc.);Vasoactive infusions 01/23/20 1447  Exposed Catheter (cm) 0 cm 01/23/20 1447  Site Assessment Clean;Dry;Intact 01/23/20 1447  Lumen #1 Status Blood return noted;Flushed;Saline locked 01/23/20 1447  Lumen #2 Status Blood return noted;Flushed;Saline locked 01/23/20 1447  Dressing Type Transparent 01/23/20 1447  Dressing Status Clean;Dry;Intact 01/23/20 1447  Antimicrobial disc in place? Yes 01/23/20 1447  Safety Lock Not Applicable 01/23/20 1447  Line Care Connections checked and tightened 01/23/20 1447  Dressing Change Due 01/30/20 01/23/20 1447       Lake Bells M 01/23/2020, 2:50 PM

## 2020-01-23 NOTE — ED Notes (Signed)
ED TO INPATIENT HANDOFF REPORT  ED Nurse Name and Phone #:   S Name/Age/Gender Douglas Carr 54 y.o. male Room/Bed: APA10/APA10  Code Status   Code Status: Full Code  Home/SNF/Other Home Patient oriented to: self, place, time and situation Is this baseline? Yes   Triage Complete: Triage complete  Chief Complaint Heart failure (HCC) [I50.9] STEMI (ST elevation myocardial infarction) (HCC) [I21.3]  Triage Note Pt brought in by [facility] for c/o hyperglycemia; pt states he has been feeling bad for the last 4 days and his machine at home has been reading high; pt c/o sob, denies any covid exposure; pt states he took a test [facility] and [facility] was negative    Allergies No Known Allergies  Level of Care/Admitting Diagnosis ED Disposition    ED Disposition Condition Comment   Admit  Hospital Area: [facility] [[id number]]  Level of Care: ICU [6]  May admit patient to [facility] or [facility] if equivalent level of care is available:: No  Covid Evaluation: Confirmed COVID Negative  Diagnosis: STEMI (ST elevation myocardial infarction) [facility]) [[id number]]  Admitting Physician: Milinda Antis  Attending Physician: Gerri Lins, AVA Ulan.Raspberry  Estimated length of stay: 3 - 4 days  Certification:: I certify this patient will need inpatient services for at least 2 midnights       B Medical/Surgery History Past Medical History:  Diagnosis Date  . Acute ST elevation myocardial infarction (STEMI) involving left circumflex coronary artery (HCC) 05/07/2016   PCI to Cx-OM  . Anginal pain (HCC)    secondary to sm. vessel disease  . Anxiety   . Arthritis    BIL KNEE PAIN AND BIL ANKLE PAIN  . Bone spur of ankle   . Cardiac arrest (HCC) 05/24/2016   with v fib  . 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 Coronary artery disease involving native coronary artery of native heart with angina pectoris (HCC) 05/24/2016   Remote MI  at 54 years of age, Last cath 2010-diffuse non-obstructive disease; last echo 06/16/08 -normal LV function, moderate concentric hypertrophy; nuc 08/2008 no ischemia;  medical therapy; STEMI May 07 2016 - PCI to Cx-OM  . Diabetes mellitus    ON ORAL MEDICATION AND INSULIN  . Dilated cardiomyopathy (HCC) 05/24/2016   EF 325-30% by Echo post STEMI (previously 30-35%)   . Hyperlipidemia   . Hypertension   . Myocardial infarction (HCC) 1996   Post MI  . Peripheral vascular disease (HCC)    HAS LEFT CAROTID ARTERY STENOSIS   AND IS S/P RIGHT CAROTID ENDARTERECTOMY 2010 Last carotid dopplers 01/08/2012 wth patent endarterectomy site  . Status post coronary artery stent placement    Past Surgical History:  Procedure Laterality Date  . APPENDECTOMY    . CARDIAC CATHETERIZATION     FEB 2010, significant branch vessel disease wth diag, marginal, PDA & PLA, nml. LV function  . CARDIAC CATHETERIZATION N/A 05/07/2016   Procedure: Left Heart Cath and Coronary Angiography;  Surgeon: Peter M Swaziland, MD;  Location: [facility] INVASIVE CV LAB;  Service: Cardiovascular;  Laterality: N/A;  . CARDIAC CATHETERIZATION N/A 05/07/2016   Procedure: Coronary Stent Intervention;  Surgeon: Peter M Swaziland, MD;  Location: [facility] INVASIVE CV LAB;  Service: Cardiovascular;  Laterality: N/A;  . CARDIAC CATHETERIZATION N/A 05/24/2016   Procedure: Left Heart Cath and Coronary Angiography;  Surgeon: Marykay Lex, MD;  Location: [facility] INVASIVE CV LAB;  Service: Cardiovascular;  Laterality:  N/A;  . CARDIAC CATHETERIZATION N/A 05/24/2016   Procedure: Coronary Stent Intervention;  Surgeon: Marykay Lex, MD;  Location: [facility] INVASIVE CV LAB;  Service: Cardiovascular;  Laterality: N/A;  2.5x20 Promus to Ostial/proximal circumflex  . CAROTID ENDARTERECTOMY  09/2008   Rt CEA  . ICD IMPLANT N/A 06/30/2016   Procedure: ICD Implant;  Surgeon: Will Jorja Loa, MD;  Location: [facility] INVASIVE CV LAB;  Service: Cardiovascular;  Laterality: N/A;  . LUNG BIOPSY   2013   Bx's suggest granulomatous dz.  . RT ANKLE   2013     A IV Location/Drains/Wounds Patient Lines/Drains/Airways Status    Active Line/Drains/Airways    Name Placement date Placement time Site Days   PICC Double Lumen 01/23/20 PICC Right Basilic 43 cm 0 cm 01/23/20  1610   less than 1          Intake/Output Last 24 hours  Intake/Output Summary (Last 24 hours) at 01/23/2020 1700 Last data filed at 01/23/2020 1600 Gross per 24 hour  Intake 1299.76 ml  Output 2000 ml  Net -700.24 ml    Labs/Imaging Results for orders placed or performed during the hospital encounter of 01/22/20 (from the past 48 hour(s))  POC CBG, ED     Status: Abnormal   Collection Time: 01/22/20  4:06 PM  Result Value Ref Range   Glucose-Capillary >600 (HH) 70 - 99 mg/dL    Comment: Glucose reference range applies only to samples taken after fasting for at least 8 hours.  CBC with Differential     Status: Abnormal   Collection Time: 01/22/20  4:14 PM  Result Value Ref Range   WBC 11.5 (H) 4.0 - 10.5 K/uL   RBC 5.18 4.22 - 5.81 MIL/uL   Hemoglobin 14.7 13.0 - 17.0 g/dL   HCT 96.0 39 - 52 %   MCV 87.8 80.0 - 100.0 fL   MCH 28.4 26.0 - 34.0 pg   MCHC 32.3 30.0 - 36.0 g/dL   RDW 45.4 09.8 - 11.9 %   Platelets 207 150 - 400 K/uL   nRBC 0.0 0.0 - 0.2 %   Neutrophils Relative % 72 %   Neutro Abs 8.3 (H) 1.7 - 7.7 K/uL   Lymphocytes Relative 19 %   Lymphs Abs 2.1 0.7 - 4.0 K/uL   Monocytes Relative 9 %   Monocytes Absolute 1.0 0 - 1 K/uL   Eosinophils Relative 0 %   Eosinophils Absolute 0.0 0 - 0 K/uL   Basophils Relative 0 %   Basophils Absolute 0.0 0 - 0 K/uL   Immature Granulocytes 0 %   Abs Immature Granulocytes 0.05 0.00 - 0.07 K/uL    Comment: Performed at [facility], [address] [place], [place] [address]  Comprehensive metabolic panel     Status: Abnormal   Collection Time: 01/22/20  4:14 PM  Result Value Ref Range   Sodium 128 (L) 135 - 145 mmol/L   Potassium 5.5 (H) 3.5 - 5.1  mmol/L   Chloride 97 (L) 98 - 111 mmol/L   CO2 22 22 - 32 mmol/L   Glucose, Bld 570 (HH) 70 - 99 mg/dL    Comment: Glucose reference range applies only to samples taken after fasting for at least 8 hours. CRITICAL RESULT CALLED TO, READ BACK BY AND VERIFIED WITH: JENNIFER KENDRICK,RN  01/22/2020 KAY    BUN 24 (H) 6 - 20 mg/dL   Creatinine, Ser 9.56 0.61 - 1.24 mg/dL   Calcium 8.3 (L) 8.9 - 10.3  mg/dL   Total Protein 7.2 6.5 - 8.1 g/dL   Albumin 3.3 (L) 3.5 - 5.0 g/dL   AST 28 15 - 41 U/L   ALT 38 0 - 44 U/L   Alkaline Phosphatase 92 38 - 126 U/L   Total Bilirubin 0.6 0.3 - 1.2 mg/dL   GFR calc non Af Amer >60 >60 mL/min   GFR calc Af Amer >60 >60 mL/min   Anion gap 9 5 - 15    Comment: Performed at [facility], [address] [place], [place] [address]  Brain natriuretic peptide     Status: Abnormal   Collection Time: 01/22/20  4:14 PM  Result Value Ref Range   B Natriuretic Peptide 788.0 (H) 0.0 - 100.0 pg/mL    Comment: Performed at [facility], [address] [place], [place] [address]  Hemoglobin A1c     Status: Abnormal   Collection Time: 01/22/20  4:14 PM  Result Value Ref Range   Hgb A1c MFr Bld 14.0 (H) 4.8 - 5.6 %    Comment: (NOTE) Pre diabetes:          5.7%-6.4%  Diabetes:              >6.4%  Glycemic control for   <7.0% adults with diabetes    Mean Plasma Glucose 355.1 mg/dL    Comment: Performed at [facility], [address] [place], [place] [address]  SARS Coronavirus 2 by RT PCR (hospital order, performed in [facility] hospital lab) Nasopharyngeal Nasopharyngeal Swab     Status: None   Collection Time: 01/22/20  4:24 PM   Specimen: Nasopharyngeal Swab  Result Value Ref Range   SARS Coronavirus 2 NEGATIVE NEGATIVE    Comment: (NOTE) SARS-CoV-2 target nucleic acids are NOT DETECTED.  The SARS-CoV-2 RNA is generally detectable in upper and lower respiratory specimens during the acute phase of infection. The lowest concentration of  SARS-CoV-2 viral copies this assay can detect is 250 copies / mL. A negative result does not preclude SARS-CoV-2 infection and should not be used as the sole basis for treatment or other patient management decisions.  A negative result may occur with improper specimen collection / handling, submission of specimen other than nasopharyngeal swab, presence of viral mutation(s) within the areas targeted by this assay, and inadequate number of viral copies (<250 copies / mL). A negative result must be combined with clinical observations, patient history, and epidemiological information.  Fact Sheet for Patients:   BoilerBrush.com.cyhttps://www.fda.gov/media/136312/download  Fact Sheet for Healthcare Providers: https://pope.com/https://www.fda.gov/media/136313/download  This test is not yet approved or  cleared by the [place] FDA and has been authorized for detection and/or diagnosis of SARS-CoV-2 by FDA under an Emergency Use Authorization (EUA).  This EUA will remain in effect (meaning this test can be used) for the duration of the COVID-19 declaration under Section 564(b)(1) of the Act, 21 U.S.C. section 360bbb-3(b)(1), unless the authorization is terminated or revoked sooner.  Performed at [facility], [address] [place], [place] [address]   Blood gas, venous (at [facility], not at [facility])     Status: Abnormal   Collection Time: 01/22/20  4:38 PM  Result Value Ref Range   FIO2 32.00    pH, Ven 7.333 7.25 - 7.43   pCO2, Ven 45.3 44 - 60 mmHg   pO2, Ven 47.3 (H) 32 - 45 mmHg   Bicarbonate 22.1 20.0 - 28.0 mmol/L   Acid-base deficit 1.6 0.0 - 2.0 mmol/L  O2 Saturation 77.2 %   Patient temperature 36.8     Comment: Performed at [facility], [address], [place], [place] [address]  Troponin I (High Sensitivity)     Status: Abnormal   Collection Time: 01/22/20  4:38 PM  Result Value Ref Range   Troponin I (High Sensitivity) 1,013 (HH) <18 ng/L    Comment: CRITICAL RESULT CALLED TO, READ BACK BY AND  VERIFIED WITH: J KINDRICK AT 1724 ON 01/22/2020 BY MOSLEY,J (NOTE) Elevated high sensitivity troponin I (hsTnI) values and significant  changes across serial measurements may suggest ACS but many other  chronic and acute conditions are known to elevate hsTnI results.  Refer to the Links section for chest pain algorithms and additional  guidance. Performed at [facility], [address], [place], [place] [address]   HIV Antibody (routine testing w rflx)     Status: None   Collection Time: 01/22/20  4:38 PM  Result Value Ref Range   HIV Screen 4th Generation wRfx Non Reactive Non Reactive    Comment: Performed at [facility], [address], [place], [place] [address]  Troponin I (High Sensitivity)     Status: Abnormal   Collection Time: 01/22/20  6:57 PM  Result Value Ref Range   Troponin I (High Sensitivity) 886 (HH) <18 ng/L    Comment: CRITICAL VALUE NOTED.  VALUE IS CONSISTENT WITH PREVIOUSLY REPORTED AND CALLED VALUE. (NOTE) Elevated high sensitivity troponin I (hsTnI) values and significant  changes across serial measurements may suggest ACS but many other  chronic and acute conditions are known to elevate hsTnI results.  Refer to the Links section for chest pain algorithms and additional  guidance. Performed at [facility], [address], [place], [place] [address]   POC CBG, ED     Status: Abnormal   Collection Time: 01/22/20  8:32 PM  Result Value Ref Range   Glucose-Capillary 311 (H) 70 - 99 mg/dL    Comment: Glucose reference range applies only to samples taken after fasting for at least 8 hours.  Troponin I (High Sensitivity)     Status: Abnormal   Collection Time: 01/22/20 10:21 PM  Result Value Ref Range   Troponin I (High Sensitivity) 880 (HH) <18 ng/L    Comment: CRITICAL VALUE NOTED.  VALUE IS CONSISTENT WITH PREVIOUSLY REPORTED AND CALLED VALUE. (NOTE) Elevated high sensitivity troponin I (hsTnI) values and significant  changes across serial  measurements may suggest ACS but many other  chronic and acute conditions are known to elevate hsTnI results.  Refer to the Links section for chest pain algorithms and additional  guidance. Performed at [facility], [address], [place], [place] [address]   CBG monitoring, ED     Status: Abnormal   Collection Time: 01/22/20 10:31 PM  Result Value Ref Range   Glucose-Capillary 232 (H) 70 - 99 mg/dL    Comment: Glucose reference range applies only to samples taken after fasting for at least 8 hours.  Heparin level (unfractionated)     Status: Abnormal   Collection Time: 01/23/20  1:22 AM  Result Value Ref Range   Heparin Unfractionated 0.13 (L) 0.30 - 0.70 IU/mL    Comment: (NOTE) If heparin results are below expected values, and patient dosage has  been confirmed, suggest follow up testing of antithrombin III levels. Performed at [facility], [address], [place], [place] [address]   Troponin I (High Sensitivity)     Status: Abnormal   Collection Time:  01/23/20  1:22 AM  Result Value Ref Range   Troponin I (High Sensitivity) 1,025 (HH) <18 ng/L    Comment: CRITICAL RESULT CALLED TO, READ BACK BY AND VERIFIED WITH: SAPPELT,J @ 0207 ON 01/23/20 BY JUW (NOTE) Elevated high sensitivity troponin I (hsTnI) values and significant  changes across serial measurements may suggest ACS but many other  chronic and acute conditions are known to elevate hsTnI results.  Refer to the Links section for chest pain algorithms and additional  guidance. Performed at [facility], [address], [place], [place] [address]   CBC     Status: None   Collection Time: 01/23/20  1:23 AM  Result Value Ref Range   WBC 9.9 4.0 - 10.5 K/uL   RBC 4.59 4.22 - 5.81 MIL/uL   Hemoglobin 13.0 13.0 - 17.0 g/dL   HCT 10.2 39 - 52 %   MCV 87.8 80.0 - 100.0 fL   MCH 28.3 26.0 - 34.0 pg   MCHC 32.3 30.0 - 36.0 g/dL   RDW 72.5 36.6 - 44.0 %   Platelets 179 150 - 400 K/uL   nRBC 0.0 0.0 - 0.2 %    Comment:  Performed at [facility], [address], [place], [place] [address]  Basic metabolic panel     Status: Abnormal   Collection Time: 01/23/20  1:23 AM  Result Value Ref Range   Sodium 133 (L) 135 - 145 mmol/L   Potassium 4.0 3.5 - 5.1 mmol/L    Comment: DELTA CHECK NOTED   Chloride 101 98 - 111 mmol/L   CO2 24 22 - 32 mmol/L   Glucose, Bld 244 (H) 70 - 99 mg/dL    Comment: Glucose reference range applies only to samples taken after fasting for at least 8 hours.   BUN 26 (H) 6 - 20 mg/dL   Creatinine, Ser 5.95 0.61 - 1.24 mg/dL   Calcium 8.2 (L) 8.9 - 10.3 mg/dL   GFR calc non Af Amer >60 >60 mL/min   GFR calc Af Amer >60 >60 mL/min   Anion gap 8 5 - 15    Comment: Performed at [facility], [address], [place], [place] [address]  Troponin I (High Sensitivity)     Status: Abnormal   Collection Time: 01/23/20  4:15 AM  Result Value Ref Range   Troponin I (High Sensitivity) 1,056 (HH) <18 ng/L    Comment: CRITICAL RESULT CALLED TO, READ BACK BY AND VERIFIED WITH: PRUITT,G AT 6:00AM ON 01/23/20 BY FESTERMAN,C (NOTE) Elevated high sensitivity troponin I (hsTnI) values and significant  changes across serial measurements may suggest ACS but many other  chronic and acute conditions are known to elevate hsTnI results.  Refer to the Links section for chest pain algorithms and additional  guidance. Performed at [facility], [address], [place], [place] [address]   Hemoglobin and hematocrit, blood     Status: Abnormal   Collection Time: 01/23/20  7:06 AM  Result Value Ref Range   Hemoglobin 12.5 (L) 13.0 - 17.0 g/dL   HCT 95.1 39 - 52 %    Comment: Performed at [facility], [address], [place], [place] [address]  CBG monitoring, ED     Status: Abnormal   Collection Time: 01/23/20  8:32 AM  Result Value Ref Range   Glucose-Capillary 120 (H) 70 - 99 mg/dL    Comment: Glucose reference range applies only to samples taken after fasting for at least 8 hours.  Heparin level  (  unfractionated)     Status: Abnormal   Collection Time: 01/23/20  8:49 AM  Result Value Ref Range   Heparin Unfractionated 0.24 (L) 0.30 - 0.70 IU/mL    Comment: (NOTE) If heparin results are below expected values, and patient dosage has  been confirmed, suggest follow up testing of antithrombin III levels. Performed at [facility], [address], [place], [place] [address]   Troponin I (High Sensitivity)     Status: Abnormal   Collection Time: 01/23/20  8:49 AM  Result Value Ref Range   Troponin I (High Sensitivity) 1,061 (HH) <18 ng/L    Comment: CRITICAL RESULT CALLED TO, READ BACK BY AND VERIFIED WITH: WALL,Y AT 10:05AM ON 01/23/20 BY FESTERMAN,C (NOTE) Elevated high sensitivity troponin I (hsTnI) values and significant  changes across serial measurements may suggest ACS but many other  chronic and acute conditions are known to elevate hsTnI results.  Refer to the Links section for chest pain algorithms and additional  guidance. Performed at [facility], [address], [place], [place] [address]   Troponin I (High Sensitivity)     Status: Abnormal   Collection Time: 01/23/20 12:00 PM  Result Value Ref Range   Troponin I (High Sensitivity) 860 (HH) <18 ng/L    Comment: CRITICAL RESULT CALLED TO, READ BACK BY AND VERIFIED WITH: TALBET,T AT 1344 ON 9.20.21 BY ISLEY,B DELTA CHECK NOTED (NOTE) Elevated high sensitivity troponin I (hsTnI) values and significant  changes across serial measurements may suggest ACS but many other  chronic and acute conditions are known to elevate hsTnI results.  Refer to the Links section for chest pain algorithms and additional  guidance. Performed at [facility], [address], [place], [place] [address]   CBG monitoring, ED     Status: Abnormal   Collection Time: 01/23/20  3:22 PM  Result Value Ref Range   Glucose-Capillary 214 (H) 70 - 99 mg/dL    Comment: Glucose reference range applies only to samples taken after fasting for at least 8  hours.   DG CHEST PORT 1 VIEW  Result Date: 01/23/2020 CLINICAL DATA:  Chest pain EXAM: PORTABLE CHEST 1 VIEW COMPARISON:  01/22/2020 FINDINGS: Lung volumes are slightly small, however, pulmonary insufflation remain stable since prior examination. Moderate left pleural effusion appears slightly enlarged, partially loculated laterally. Perihilar interstitial pulmonary edema persists, stable since prior examination, possibly cardiogenic in nature. Benign calcified granuloma again noted at the right lung base. No pneumothorax. Cardiac size within normal limits. Left subclavian pacemaker defibrillator unchanged. IMPRESSION: Stable moderate perihilar interstitial pulmonary edema, possibly cardiogenic in nature. Increasing, moderate, partially laterally loculated left pleural effusion. Electronically Signed   By: Helyn Numbers MD   On: 01/23/2020 07:01   DG Chest Portable 1 View  Result Date: 01/22/2020 CLINICAL DATA:  54 year old male with shortness of breath for 4 days. Recently tested negative for COVID-19. EXAM: PORTABLE CHEST 1 VIEW COMPARISON:  Portable chest 05/20/2017 and earlier. FINDINGS: Portable AP upright view at 1628 hours. Perihilar and basilar predominant bilateral increased interstitial markings. Veiling opacity at the left lung base compatible with small pleural effusion. Possible trace right pleural effusion. No pneumothorax. Mediastinal contours remain normal. Chronic left chest cardiac AICD. Chronic calcified granulomas in the right lung. Visualized tracheal air column is within normal limits. No acute osseous abnormality identified. IMPRESSION: Increased bilateral pulmonary interstitial markings and left pleural effusion. Favor acute pulmonary edema. Electronically Signed   By: Odessa Fleming M.D.   On: 01/22/2020 16:37   ECHOCARDIOGRAM  COMPLETE  Result Date: 01/23/2020    ECHOCARDIOGRAM REPORT   Patient Name:   Douglas Carr [facility] Date of Exam: 01/23/2020 Medical Rec #:  [id number]            Height:       70.0 in Accession #:    [id number]          Weight:       200.0 lb Date of Birth:  03/26/1966           BSA:          2.087 m Patient Age:    54 years            BP:           108/58 mmHg Patient Gender: M                   HR:           72 bpm. [facility]am [facility] Procedure: 2D Echo Indications:    Dyspnea 786.09 / R06.00  History:        Patient has prior history of Echocardiogram examinations, most                 recent 04/13/2018. CAD and Previous Myocardial Infarction; Risk                 Factors:Hypertension, Diabetes, Dyslipidemia and Former Smoker.                 Dilated Cardiomyopathy.  Sonographer:    Jeryl Columbia RDCS (AE) Referring Phys: [id number] York Ram ARRIEN IMPRESSIONS  1. Left ventricular ejection fraction, by estimation, is 30%. The left ventricle has moderately decreased function. The left ventricle demonstrates global hypokinesis. Left ventricular diastolic parameters are consistent with Grade III diastolic dysfunction (restrictive). Elevated left atrial pressure.  2. Right ventricular systolic function is normal. The right ventricular size is normal.  3. Left atrial size was moderately dilated.  4. A small pericardial effusion is present. The pericardial effusion is circumferential. Moderate pleural effusion in the left lateral region.  5. The mitral valve is normal in structure. Mild mitral valve regurgitation. No evidence of mitral stenosis.  6. The aortic valve is tricuspid. Aortic valve regurgitation is not visualized. No aortic stenosis is present.  7. The inferior vena cava is normal in size with greater than 50% respiratory variability, suggesting right atrial pressure of 3 mmHg. FINDINGS  Left Ventricle: Left ventricular ejection fraction, by estimation, is 30%. The left ventricle has moderately decreased function. The left ventricle demonstrates global hypokinesis. The left ventricular internal cavity size was normal in size. There is no left  ventricular hypertrophy. Left ventricular diastolic parameters are consistent with Grade III diastolic dysfunction (restrictive). Elevated left atrial pressure. Right Ventricle: The right ventricular size is normal. No increase in right ventricular wall thickness. Right ventricular systolic function is normal. Left Atrium: Left atrial size was moderately dilated. Right Atrium: Right atrial size was normal in size. Pericardium: A small pericardial effusion is present. The pericardial effusion is circumferential. Mitral Valve: The mitral valve is normal in structure. Mild mitral valve regurgitation. No evidence of mitral valve stenosis. Tricuspid Valve: The tricuspid valve is normal in structure. Tricuspid valve regurgitation is not demonstrated. No evidence of tricuspid stenosis. Aortic Valve: The aortic valve is tricuspid. Aortic valve regurgitation is not visualized. No aortic stenosis is present. Aortic valve mean gradient measures 1.5 mmHg. Aortic valve peak gradient measures 2.4 mmHg. Aortic valve area,  by VTI measures 2.01 cm. Pulmonic Valve: The pulmonic valve was not well visualized. Pulmonic valve regurgitation is not visualized. No evidence of pulmonic stenosis. Aorta: The aortic root is normal in size and structure. Pulmonary Artery: Indeterminate PASP, inadequate TR jet. Venous: The inferior vena cava is normal in size with greater than 50% respiratory variability, suggesting right atrial pressure of 3 mmHg. IAS/Shunts: No atrial level shunt detected by color flow Doppler. Additional Comments: A pacer wire is visualized. There is a moderate pleural effusion in the left lateral region.  LEFT VENTRICLE PLAX 2D LVIDd:         5.64 cm  Diastology LVIDs:         4.98 cm  LV e' medial:    4.40 cm/s LV PW:         1.20 cm  LV E/e' medial:  16.8 LV IVS:        1.08 cm  LV e' lateral:   8.45 cm/s LVOT diam:     2.10 cm  LV E/e' lateral: 8.8 LV SV:         28 LV SV Index:   14 LVOT Area:     3.46 cm  RIGHT  VENTRICLE RV S prime:     8.27 cm/s TAPSE (M-mode): 1.7 cm LEFT ATRIUM             Index       RIGHT ATRIUM           Index LA diam:        3.50 cm 1.68 cm/m  RA Area:     14.30 cm LA Vol (A2C):   64.7 ml 31.00 ml/m RA Volume:   38.10 ml  18.25 ml/m LA Vol (A4C):   80.1 ml 38.37 ml/m LA Biplane Vol: 76.8 ml 36.79 ml/m  AORTIC VALVE AV Area (Vmax):    2.18 cm AV Area (Vmean):   1.99 cm AV Area (VTI):     2.01 cm AV Vmax:           78.18 cm/s AV Vmean:          59.054 cm/s AV VTI:            0.141 m AV Peak Grad:      2.4 mmHg AV Mean Grad:      1.5 mmHg LVOT Vmax:         49.10 cm/s LVOT Vmean:        34.000 cm/s LVOT VTI:          0.082 m LVOT/AV VTI ratio: 0.58  AORTA Ao Root diam: 2.90 cm MITRAL VALVE MV Area (PHT): 4.17 cm    SHUNTS MV Decel Time: 182 msec    Systemic VTI:  0.08 m MR Peak grad: 59.3 mmHg    Systemic Diam: 2.10 cm MR Mean grad: 43.0 mmHg MR Vmax:      385.00 cm/s MR Vmean:     313.0 cm/s MV E velocity: 74.10 cm/s MV A velocity: 20.60 cm/s MV E/A ratio:  3.60 Dina Rich MD Electronically signed by Dina Rich MD Signature Date/Time: 01/23/2020/10:49:30 AM    Final    [place] EKG SITE RITE  Result Date: 01/23/2020 If Site Rite image not attached, placement could not be confirmed due to current cardiac rhythm.   Pending Labs Unresulted Labs (From admission, onward)          Start     Ordered   01/29/20 0500  Creatinine, serum  (enoxaparin (LOVENOX)  CrCl >/= 30 ml/min)  Weekly,   R     Comments: while on enoxaparin therapy    01/22/20 2023   01/24/20 0500  Heparin level (unfractionated)  Daily,   R      01/22/20 1806   01/23/20 1700  Heparin level (unfractionated)  Once-Timed,   STAT        01/23/20 1048   01/23/20 0500  CBC  Daily,   R      01/22/20 1806   01/23/20 0500  Basic metabolic panel  Daily,   R      01/22/20 2023   01/22/20 1624  Urinalysis, Routine w reflex microscopic  Once,   STAT        01/22/20 1623          Vitals/Pain Today's Vitals    01/23/20 1545 01/23/20 1600 01/23/20 1615 01/23/20 1645  BP: 90/68 107/70 103/75 116/81  Pulse: 76 81 78 74  Resp: Temp:      TempSrc:      SpO2: 100% 99% 98% 98%  Weight:      Height:      PainSc:        Isolation Precautions No active isolations  Medications Medications  heparin bolus via infusion 4,000 Units (4,000 Units Intravenous Bolus from Bag 01/22/20 1855)    Followed by  heparin ADULT infusion 100 units/mL (25000 units/264mL sodium chloride 0.45%) (1,500 Units/hr Intravenous Rate/Dose Change 01/23/20 1109)  aspirin EC tablet 81 mg (81 mg Oral Given 01/23/20 1138)  atorvastatin (LIPITOR) tablet 80 mg (80 mg Oral Given 01/23/20 1141)  nitroGLYCERIN (NITROSTAT) SL tablet 0.4 mg (has no administration in time range)  DULoxetine (CYMBALTA) DR capsule 60 mg (60 mg Oral Given 01/23/20 1143)  mirtazapine (REMERON) tablet 7.5 mg (7.5 mg Oral Given 01/22/20 2233)  tamsulosin (FLOMAX) capsule 0.4 mg (has no administration in time range)  clopidogrel (PLAVIX) tablet 75 mg (75 mg Oral Given 01/23/20 1142)  primidone (MYSOLINE) tablet 50 mg (50 mg Oral Given 01/22/20 2235)  sodium chloride flush (NS) 0.9 % injection 3 mL (3 mLs Intravenous Not Given 01/23/20 1150)  sodium chloride flush (NS) 0.9 % injection 3 mL (has no administration in time range)  0.9 %  sodium chloride infusion (has no administration in time range)  acetaminophen (TYLENOL) tablet 650 mg (has no administration in time range)  ondansetron (ZOFRAN) injection 4 mg (has no administration in time range)  insulin aspart (novoLOG) injection 0-15 Units (0 Units Subcutaneous Hold 01/23/20 1535)  morphine 2 MG/ML injection 1 mg (1 mg Intravenous Given 01/23/20 0524)  insulin glargine (LANTUS) injection 60 Units (60 Units Subcutaneous Given 01/23/20 1325)  sodium chloride flush (NS) 0.9 % injection 10-40 mL (has no administration in time range)  sodium chloride flush (NS) 0.9 % injection 10-40 mL (has no administration in  time range)  Chlorhexidine Gluconate Cloth 2 % PADS 6 each (has no administration in time range)  aspirin chewable tablet 324 mg (324 mg Oral Given 01/22/20 1715)  furosemide (LASIX) injection 40 mg (40 mg Intravenous Given 01/22/20 1851)  LORazepam (ATIVAN) tablet 0.5 mg (0.5 mg Oral Given 01/22/20 1932)  insulin aspart (novoLOG) injection 5 Units (5 Units Intravenous Given 01/22/20 1932)  heparin bolus via infusion 1,000 Units (1,000 Units Intravenous Bolus from Bag 01/23/20 0225)  sodium chloride 0.9 % bolus 500 mL (0 mLs Intravenous Stopped 01/23/20 0625)  ketorolac (TORADOL) 15 MG/ML injection 15 mg (15 mg Intravenous Given 01/23/20 0557)  sodium chloride 0.9 % bolus 500 mL (0 mLs Intravenous Stopped 01/23/20 0705)  heparin bolus via infusion 1,000 Units (1,000 Units Intravenous Bolus from Bag 01/23/20 1518)    Mobility walks Moderate fall risk   Focused Assessments    R Recommendations: See Admitting Provider Note  Report given to:   Additional Notes:

## 2020-01-23 NOTE — ED Notes (Signed)
Per Dr. Gerri Lins D/C Levophed.

## 2020-01-23 NOTE — Progress Notes (Signed)
ANTICOAGULATION CONSULT NOTE -   Pharmacy Consult for Heparin Indication: chest pain/ACS  No Known Allergies  Patient Measurements: Height: 5\' 10"  (177.8 cm) Weight: 90.7 kg (200 lb) IBW/kg (Calculated) : 73 HEPARIN DW (KG): 90.7  Vital Signs: BP: 108/58 (09/20 0845) Pulse Rate: 72 (09/20 0845)  Labs: Recent Labs    01/22/20 1614 01/22/20 1614 01/22/20 1638 01/23/20 0122 01/23/20 0123 01/23/20 0415 01/23/20 0706 01/23/20 0849  HGB 14.7   < >  --   --  13.0  --  12.5*  --   HCT 45.5  --   --   --  40.3  --  39.4  --   PLT 207  --   --   --  179  --   --   --   HEPARINUNFRC  --   --   --  0.13*  --   --   --  0.24*  CREATININE 1.03  --   --   --  0.91  --   --   --   TROPONINIHS  --   --    < > 1,025*  --  1,056*  --  1,061*   < > = values in this interval not displayed.    Estimated Creatinine Clearance: 106.4 mL/min (by C-G formula based on SCr of 0.91 mg/dL).   Medical History: Past Medical History:  Diagnosis Date  . Acute ST elevation myocardial infarction (STEMI) involving left circumflex coronary artery (HCC) 05/07/2016   PCI to Cx-OM  . Anginal pain (HCC)    secondary to sm. vessel disease  . Anxiety   . Arthritis    BIL KNEE PAIN AND BIL ANKLE PAIN  . Bone spur of ankle   . Cardiac arrest (HCC) 05/24/2016   with v fib  . 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 Coronary artery disease involving native coronary artery of native heart with angina pectoris (HCC) 05/24/2016   Remote MI at 54 years of age, Last cath 2010-diffuse non-obstructive disease; last echo 06/16/08 -normal LV function, moderate concentric hypertrophy; nuc 08/2008 no ischemia;  medical therapy; STEMI May 07 2016 - PCI to Cx-OM  . Diabetes mellitus    ON ORAL MEDICATION AND INSULIN  . Dilated cardiomyopathy (HCC) 05/24/2016   EF 325-30% by Echo post STEMI (previously 30-35%)   . Hyperlipidemia   . Hypertension   . Myocardial infarction  (HCC) 1996   Post MI  . Peripheral vascular disease (HCC)    HAS LEFT CAROTID ARTERY STENOSIS   AND IS S/P RIGHT CAROTID ENDARTERECTOMY 2010 Last carotid dopplers 01/08/2012 wth patent endarterectomy site  . Status post coronary artery stent placement     Medications:  See med rec  Assessment: Patient presented with shortness of breath and tachypneic. He has had chest pressure and labs show elevated troponin. Reviewed home meds and patient not on oral anticoagulants. Pharmacy to dose heparin.  Heparin level 0.24- subtherapeutic   Goal of Therapy:  Heparin level 0.3-0.7 units/ml Monitor platelets by anticoagulation protocol: Yes   Plan:  Rebolus heparin 1000 units x 1  Increase heparin infusion to 1500 units/hr Check anti-Xa level in 6 hours and daily while on heparin. Continue to monitor H&H and platelets.   03/09/2012, PharmD Clinical Pharmacist 01/23/2020 10:39 AM

## 2020-01-23 NOTE — TOC Initial Note (Signed)
Transition of Care [facility]) - Initial/Assessment Note   Patient Details  Name: Douglas Carr MRN: [id number] Date of Birth: 1965-06-11  Transition of Care [place]) CM/SW Contact:    Douglas Schlein, LCSW Phone Number: 01/23/2020, 1:42 PM  Clinical Narrative: Patient is a 54 year old male who was admitted for heart failure. Patient has a history of hypertension, CAD, DM, ischemic cardiomyopathy, anxiety, depression, and hyperlipidemia. TOC received consult for CHF screening. CSW spoke with patient to complete assessment. Per patient, he resides at home with his wife, Douglas Carr. Patient reported he has a walker and cane at home, but no history of HH services. Patient reported he is currently independent with his ADLs and is able to afford his medications each month, which he takes as prescribed.  Per CHF screening, patient reported he follows a heart healthy diet and restricts his salt intake. Patient reported he is not restricting his fluid intake or completing daily weights at this time. TOC to follow for discharge needs.  Expected Discharge Plan: Home/Self Care Barriers to Discharge: Continued Medical Work up  Patient Goals and CMS Choice Patient states their goals for this hospitalization and ongoing recovery are:: Discharge home CMS Medicare.gov Compare Post Acute Care list provided to:: Patient Choice offered to / list presented to : Patient  Expected Discharge Plan and Services Expected Discharge Plan: Home/Self Care In-house Referral: Clinical Social Work Discharge Planning Services: NA Living arrangements for the past 2 months: Single Family Home  Prior Living Arrangements/Services Living arrangements for the past 2 months: Single Family Home Lives with:: Spouse Patient language and need for interpreter reviewed:: Yes Do you feel safe going back to the place where you live?: Yes      Need for Family Participation in Patient Care: No (Comment) Care giver support system in  place?: Yes (comment) Current home services: DME (Walker, cane) Criminal Activity/Legal Involvement Pertinent to Current Situation/Hospitalization: No - Comment as needed  Emotional Assessment Appearance:: Appears stated age Attitude/Demeanor/Rapport: Engaged Affect (typically observed): Accepting Orientation: : Oriented to Self, Oriented to Place, Oriented to  Time, Oriented to Situation Alcohol / Substance Use: Not Applicable Psych Involvement: No (comment)  Admission diagnosis:  Heart failure (HCC) [I50.9] STEMI (ST elevation myocardial infarction) [facility]) [I21.3] Patient Active Problem List   Diagnosis Date Noted  . STEMI (ST elevation myocardial infarction) (HCC) 01/23/2020  . Heart failure (HCC) 01/22/2020  . NSTEMI (non-ST elevated myocardial infarction) (HCC) 01/22/2020  . Hyperlipidemia 03/31/2018  . Anxiety and depression 11/19/2016  . Ischemic cardiomyopathy 06/30/2016  . Diabetes mellitus with complication (HCC)   . Diabetes mellitus type 2 in obese (HCC)   . Acute blood loss anemia   . Myoclonus   . Anoxic brain injury (HCC)   . Dilated cardiomyopathy (HCC) 05/24/2016  . Coronary artery disease involving native coronary artery of native heart with angina pectoris (HCC) 05/24/2016  . 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 Status post coronary artery stent placement   . Essential hypertension 09/21/2013  . Pulmonary nodules 10/16/2011   PCP:  Carr, Douglas Radon, MD Pharmacy:   [facility] [phone] - [place], [place] - [address] [place] [address] Phone: [phone] Fax: [phone]  Readmission Risk Interventions No flowsheet data found.

## 2020-01-23 NOTE — ED Notes (Signed)
CRITICAL VALUE ALERT  Critical Value: Troponin 1016  Date & Time Notied:  01/23/20 & 1007  Provider Notified: Dr. Gerri Lins  Orders Received/Actions taken: N/A.

## 2020-01-23 NOTE — Progress Notes (Signed)
*  PRELIMINARY RESULTS* Echocardiogram 2D Echocardiogram has been performed.  Douglas Carr 01/23/2020, 9:50 AM

## 2020-01-24 ENCOUNTER — Encounter (HOSPITAL_COMMUNITY)
Admission: EM | Disposition: A | Discharge status: Home Health | Payer: Self-pay | Source: Home / Self Care | Attending: Cardiothoracic Surgery

## 2020-01-24 ENCOUNTER — Encounter (HOSPITAL_COMMUNITY): Payer: Self-pay | Admitting: Interventional Cardiology

## 2020-01-24 DIAGNOSIS — R778 Other specified abnormalities of plasma proteins: Secondary | ICD-10-CM

## 2020-01-24 DIAGNOSIS — I25118 Atherosclerotic heart disease of native coronary artery with other forms of angina pectoris: Secondary | ICD-10-CM

## 2020-01-24 DIAGNOSIS — I5042 Chronic combined systolic (congestive) and diastolic (congestive) heart failure: Secondary | ICD-10-CM

## 2020-01-24 HISTORY — PX: INTRAVASCULAR PRESSURE WIRE/FFR STUDY: CATH118243

## 2020-01-24 HISTORY — PX: RIGHT/LEFT HEART CATH AND CORONARY ANGIOGRAPHY: CATH118266

## 2020-01-24 LAB — POCT I-STAT 7, (LYTES, BLD GAS, ICA,H+H)
Acid-Base Excess: 0 mmol/L (ref 0.0–2.0)
Bicarbonate: 25.9 mmol/L (ref 20.0–28.0)
Calcium, Ion: 1.21 mmol/L (ref 1.15–1.40)
HCT: 38 % — ABNORMAL LOW (ref 39.0–52.0)
Hemoglobin: 12.9 g/dL — ABNORMAL LOW (ref 13.0–17.0)
O2 Saturation: 91 %
Potassium: 3.8 mmol/L (ref 3.5–5.1)
Sodium: 138 mmol/L (ref 135–145)
TCO2: 27 mmol/L (ref 22–32)
pCO2 arterial: 46.8 mmHg (ref 32.0–48.0)
pH, Arterial: 7.351 (ref 7.350–7.450)
pO2, Arterial: 66 mmHg — ABNORMAL LOW (ref 83.0–108.0)

## 2020-01-24 LAB — BASIC METABOLIC PANEL
Anion gap: 8 (ref 5–15)
BUN: 32 mg/dL — ABNORMAL HIGH (ref 6–20)
CO2: 25 mmol/L (ref 22–32)
Calcium: 8.1 mg/dL — ABNORMAL LOW (ref 8.9–10.3)
Chloride: 103 mmol/L (ref 98–111)
Creatinine, Ser: 0.92 mg/dL (ref 0.61–1.24)
GFR calc Af Amer: >60 mL/min (ref >60)
GFR calc non Af Amer: >60 mL/min (ref >60)
Glucose, Bld: 112 mg/dL — ABNORMAL HIGH (ref 70–99)
Potassium: 3.9 mmol/L (ref 3.5–5.1)
Sodium: 136 mmol/L (ref 135–145)

## 2020-01-24 LAB — GLUCOSE, CAPILLARY
Glucose-Capillary: 116 mg/dL — ABNORMAL HIGH (ref 70–99)
Glucose-Capillary: 103 mg/dL — ABNORMAL HIGH (ref 70–99)
Glucose-Capillary: 71 mg/dL (ref 70–99)
Glucose-Capillary: 132 mg/dL — ABNORMAL HIGH (ref 70–99)
Glucose-Capillary: 60 mg/dL — ABNORMAL LOW (ref 70–99)
Glucose-Capillary: 75 mg/dL (ref 70–99)
Glucose-Capillary: 119 mg/dL — ABNORMAL HIGH (ref 70–99)

## 2020-01-24 LAB — CBC
HCT: 38.7 % — ABNORMAL LOW (ref 39.0–52.0)
Hemoglobin: 12.5 g/dL — ABNORMAL LOW (ref 13.0–17.0)
MCH: 28.7 pg (ref 26.0–34.0)
MCHC: 32.3 g/dL (ref 30.0–36.0)
MCV: 88.8 fL (ref 80.0–100.0)
Platelets: 194 10*3/uL (ref 150–400)
RBC: 4.36 MIL/uL (ref 4.22–5.81)
RDW: 13 % (ref 11.5–15.5)
WBC: 9.6 10*3/uL (ref 4.0–10.5)
nRBC: 0 % (ref 0.0–0.2)

## 2020-01-24 LAB — POCT ACTIVATED CLOTTING TIME
Activated Clotting Time: 268 seconds
Activated Clotting Time: 175 seconds

## 2020-01-24 LAB — POCT I-STAT EG7
Acid-Base Excess: 2 mmol/L (ref 0.0–2.0)
Bicarbonate: 28.3 mmol/L — ABNORMAL HIGH (ref 20.0–28.0)
Calcium, Ion: 1.2 mmol/L (ref 1.15–1.40)
HCT: 39 % (ref 39.0–52.0)
Hemoglobin: 13.3 g/dL (ref 13.0–17.0)
O2 Saturation: 57 %
Potassium: 3.9 mmol/L (ref 3.5–5.1)
Sodium: 139 mmol/L (ref 135–145)
TCO2: 30 mmol/L (ref 22–32)
pCO2, Ven: 51.7 mmHg (ref 44.0–60.0)
pH, Ven: 7.347 (ref 7.250–7.430)
pO2, Ven: 32 mmHg (ref 32.0–45.0)

## 2020-01-24 LAB — HEPARIN LEVEL (UNFRACTIONATED)
Heparin Unfractionated: 0.37 IU/mL (ref 0.30–0.70)
Heparin Unfractionated: 1.92 IU/mL — ABNORMAL HIGH (ref 0.30–0.70)
Heparin Unfractionated: 0.32 IU/mL (ref 0.30–0.70)

## 2020-01-24 LAB — COOXEMETRY PANEL
Carboxyhemoglobin: 1.4 % (ref 0.5–1.5)
Methemoglobin: 1.1 % (ref 0.0–1.5)
O2 Saturation: 63.2 %
Total hemoglobin: 13.2 g/dL (ref 12.0–16.0)

## 2020-01-24 LAB — MRSA PCR SCREENING: MRSA by PCR: POSITIVE — AB

## 2020-01-24 SURGERY — RIGHT/LEFT HEART CATH AND CORONARY ANGIOGRAPHY
Anesthesia: LOCAL

## 2020-01-24 MED ORDER — HYDRALAZINE HCL 20 MG/ML IJ SOLN: 10.0000 mg | INTRAMUSCULAR | Status: AC | PRN

## 2020-01-24 MED ORDER — CHLORHEXIDINE GLUCONATE CLOTH 2 % EX PADS
6.0000 | MEDICATED_PAD | Freq: Every day | CUTANEOUS | Status: DC
  Administered 2020-01-26 – 2020-01-27 (×2): 6 via TOPICAL

## 2020-01-24 MED ORDER — LIDOCAINE HCL (PF) 1 % IJ SOLN
INTRAMUSCULAR | Status: AC
  Filled 2020-01-24: qty 30

## 2020-01-24 MED ORDER — SODIUM CHLORIDE 0.9 % IV SOLN
INTRAVENOUS | Status: AC | PRN
  Administered 2020-01-24: 10 mL/h via INTRAVENOUS

## 2020-01-24 MED ORDER — HEPARIN (PORCINE) 25000 UT/250ML-% IV SOLN
1900.0000 [IU]/h | INTRAVENOUS | Status: DC
  Administered 2020-01-24: 1500 [IU]/h via INTRAVENOUS
  Administered 2020-01-25: 1600 [IU]/h via INTRAVENOUS
  Administered 2020-01-25: 1750 [IU]/h via INTRAVENOUS
  Administered 2020-01-26 – 2020-01-27 (×2): 1900 [IU]/h via INTRAVENOUS
  Filled 2020-01-24 (×4): qty 250

## 2020-01-24 MED ORDER — FENTANYL CITRATE (PF) 100 MCG/2ML IJ SOLN
INTRAMUSCULAR | Status: AC
  Filled 2020-01-24: qty 2

## 2020-01-24 MED ORDER — SODIUM CHLORIDE 0.9% FLUSH: 3.0000 mL | INTRAVENOUS | Status: DC | PRN

## 2020-01-24 MED ORDER — HEPARIN (PORCINE) IN NACL 1000-0.9 UT/500ML-% IV SOLN
INTRAVENOUS | Status: DC | PRN
  Administered 2020-01-24 (×2): 500 mL

## 2020-01-24 MED ORDER — NITROGLYCERIN 1 MG/10 ML FOR IR/CATH LAB
INTRA_ARTERIAL | Status: AC
  Filled 2020-01-24: qty 10

## 2020-01-24 MED ORDER — SODIUM CHLORIDE 0.9 % IV SOLN: 250.0000 mL | INTRAVENOUS | Status: DC | PRN

## 2020-01-24 MED ORDER — IOHEXOL 350 MG/ML SOLN
INTRAVENOUS | Status: DC | PRN
  Administered 2020-01-24: 70 mL

## 2020-01-24 MED ORDER — HEPARIN SODIUM (PORCINE) 1000 UNIT/ML IJ SOLN
INTRAMUSCULAR | Status: DC | PRN
  Administered 2020-01-24: 4500 [IU] via INTRAVENOUS
  Administered 2020-01-24: 5000 [IU] via INTRAVENOUS

## 2020-01-24 MED ORDER — FUROSEMIDE 10 MG/ML IJ SOLN
40.0000 mg | Freq: Once | INTRAMUSCULAR | Status: AC
  Administered 2020-01-24: 40 mg via INTRAVENOUS
  Filled 2020-01-24: qty 4

## 2020-01-24 MED ORDER — DIAZEPAM 5 MG PO TABS
5.0000 mg | ORAL_TABLET | Freq: Once | ORAL | Status: AC
  Administered 2020-01-24: 5 mg via ORAL
  Filled 2020-01-24: qty 1

## 2020-01-24 MED ORDER — LABETALOL HCL 5 MG/ML IV SOLN: 10.0000 mg | INTRAVENOUS | Status: AC | PRN

## 2020-01-24 MED ORDER — FENTANYL CITRATE (PF) 100 MCG/2ML IJ SOLN
INTRAMUSCULAR | Status: DC | PRN
Start: 2020-01-24 — End: 2020-01-24
  Administered 2020-01-24: 25 ug via INTRAVENOUS

## 2020-01-24 MED ORDER — ONDANSETRON HCL 4 MG/2ML IJ SOLN: 4.0000 mg | Freq: Four times a day (QID) | INTRAMUSCULAR | Status: DC | PRN

## 2020-01-24 MED ORDER — ACETAMINOPHEN 325 MG PO TABS: 650.0000 mg | ORAL_TABLET | ORAL | Status: DC | PRN

## 2020-01-24 MED ORDER — ASPIRIN 81 MG PO CHEW
81.0000 mg | CHEWABLE_TABLET | Freq: Every day | ORAL | Status: DC
  Administered 2020-01-25 – 2020-01-26 (×2): 81 mg via ORAL
  Filled 2020-01-24 (×2): qty 1

## 2020-01-24 MED ORDER — MIDAZOLAM HCL 2 MG/2ML IJ SOLN
INTRAMUSCULAR | Status: AC
  Filled 2020-01-24: qty 2

## 2020-01-24 MED ORDER — SODIUM CHLORIDE 0.9% FLUSH
3.0000 mL | Freq: Two times a day (BID) | INTRAVENOUS | Status: DC
  Administered 2020-01-24: 3 mL via INTRAVENOUS

## 2020-01-24 MED ORDER — SODIUM CHLORIDE 0.9 % IV SOLN: INTRAVENOUS | Status: DC

## 2020-01-24 MED ORDER — ASPIRIN 81 MG PO CHEW: 81.0000 mg | CHEWABLE_TABLET | ORAL | Status: AC

## 2020-01-24 MED ORDER — SODIUM CHLORIDE 0.9% FLUSH
3.0000 mL | Freq: Two times a day (BID) | INTRAVENOUS | Status: DC
  Administered 2020-01-25 (×2): 3 mL via INTRAVENOUS

## 2020-01-24 MED ORDER — OXYCODONE HCL 5 MG PO TABS: 5.0000 mg | ORAL_TABLET | ORAL | Status: DC | PRN

## 2020-01-24 MED ORDER — LIDOCAINE HCL (PF) 1 % IJ SOLN
INTRAMUSCULAR | Status: DC | PRN
  Administered 2020-01-24 (×2): 5 mL

## 2020-01-24 MED ORDER — MIDAZOLAM HCL 2 MG/2ML IJ SOLN
INTRAMUSCULAR | Status: DC | PRN
  Administered 2020-01-24: 0.5 mg via INTRAVENOUS

## 2020-01-24 MED ORDER — FUROSEMIDE 10 MG/ML IJ SOLN
40.0000 mg | Freq: Two times a day (BID) | INTRAMUSCULAR | Status: DC
  Administered 2020-01-24 – 2020-01-26 (×5): 40 mg via INTRAVENOUS
  Filled 2020-01-24 (×5): qty 4

## 2020-01-24 MED ORDER — INSULIN ASPART 100 UNIT/ML ~~LOC~~ SOLN
0.0000 [IU] | Freq: Three times a day (TID) | SUBCUTANEOUS | Status: DC
  Administered 2020-01-25 – 2020-01-26 (×3): 2 [IU] via SUBCUTANEOUS
  Administered 2020-01-26: 1 [IU] via SUBCUTANEOUS

## 2020-01-24 MED ORDER — HEPARIN (PORCINE) IN NACL 1000-0.9 UT/500ML-% IV SOLN
INTRAVENOUS | Status: AC
  Filled 2020-01-24: qty 1000

## 2020-01-24 MED ORDER — DIAZEPAM 5 MG PO TABS
5.0000 mg | ORAL_TABLET | Freq: Three times a day (TID) | ORAL | Status: DC | PRN
  Administered 2020-01-25: 5 mg via ORAL
  Filled 2020-01-24: qty 1

## 2020-01-24 MED ORDER — MUPIROCIN 2 % EX OINT
1.0000 "application " | TOPICAL_OINTMENT | Freq: Two times a day (BID) | CUTANEOUS | Status: DC
  Administered 2020-01-24 – 2020-01-26 (×6): 1 via NASAL
  Filled 2020-01-24 (×2): qty 22

## 2020-01-24 MED ORDER — ATROPINE SULFATE 1 MG/10ML IJ SOSY
PREFILLED_SYRINGE | INTRAMUSCULAR | Status: AC
  Filled 2020-01-24: qty 10

## 2020-01-24 MED ORDER — VERAPAMIL HCL 2.5 MG/ML IV SOLN
INTRAVENOUS | Status: DC | PRN
  Administered 2020-01-24: 10 mL via INTRA_ARTERIAL

## 2020-01-24 MED ORDER — HEPARIN SODIUM (PORCINE) 1000 UNIT/ML IJ SOLN
INTRAMUSCULAR | Status: AC
  Filled 2020-01-24: qty 1

## 2020-01-24 SURGICAL SUPPLY — 16 items
CATH 5FR JL3.5 JR4 ANG PIG MP (CATHETERS) ×1 IMPLANT
CATH LAUNCHER 5F EBU3.5 (CATHETERS) ×1 IMPLANT
CATH SWAN GANZ 7F STRAIGHT (CATHETERS) ×1 IMPLANT
DEVICE RAD COMP TR BAND LRG (VASCULAR PRODUCTS) ×1 IMPLANT
ELECT DEFIB PAD ADLT CADENCE (PAD) ×1 IMPLANT
GLIDESHEATH SLEND A-KIT 6F 22G (SHEATH) ×1 IMPLANT
GUIDEWIRE INQWIRE 1.5J.035X260 (WIRE) IMPLANT
GUIDEWIRE PRESSURE COMET II (WIRE) ×1 IMPLANT
INQWIRE 1.5J .035X260CM (WIRE) ×2
KIT HEART LEFT (KITS) ×2 IMPLANT
PACK CARDIAC CATHETERIZATION (CUSTOM PROCEDURE TRAY) ×2 IMPLANT
SHEATH PINNACLE 7F 10CM (SHEATH) ×1 IMPLANT
SHEATH PROBE COVER 6X72 (BAG) ×1 IMPLANT
TRANSDUCER W/STOPCOCK (MISCELLANEOUS) ×2 IMPLANT
TUBING CIL FLEX 10 FLL-RA (TUBING) ×2 IMPLANT
WIRE HI TORQ VERSACORE-J 145CM (WIRE) ×1 IMPLANT

## 2020-01-24 NOTE — CV Procedure (Signed)
   Severe three-vessel CAD: Ostial 95% RCA, ostial occlusion of circumflex due to diffuse in-stent restenosis, and 50 to 70% proximal to mid LAD with DFR 0.7.  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  PA O2 sat 57%; cardiac output 4.9 L/min  PLAN: Aggressive management of heart failure (may require inotropes) and when stable consider CABG with fallback plan PCI.

## 2020-01-24 NOTE — Progress Notes (Addendum)
ANTICOAGULATION CONSULT NOTE - Follow-Up  Pharmacy Consult for Heparin Indication: chest pain/ACS  No Known Allergies  Patient Measurements: Height: 5\' 10"  (177.8 cm) Weight: 94.7 kg (208 lb 12.4 oz) IBW/kg (Calculated) : 73 HEPARIN DW (KG): 92.3  Vital Signs: Temp: 97.7 F (36.5 C) (09/21 0400) Temp Source: Oral (09/21 0400) BP: 123/79 (09/21 0900) Pulse Rate: 77 (09/21 0900)  Labs: Recent Labs    01/22/20 1614 01/22/20 1638 01/23/20 0122 01/23/20 0123 01/23/20 0123 01/23/20 0415 01/23/20 0706 01/23/20 0849 01/23/20 1200 01/23/20 1901 01/24/20 0042 01/24/20 0434 01/24/20 0527  HGB 14.7   < >  --  13.0   < >  --  12.5*  --   --   --   --  12.5*  --   HCT 45.5   < >  --  40.3  --   --  39.4  --   --   --   --  38.7*  --   PLT 207  --   --  179  --   --   --   --   --   --   --  194  --   HEPARINUNFRC  --    < >   < >  --   --   --   --  0.24*  --    < > 0.37 1.92* 0.32  CREATININE 1.03  --   --  0.91  --   --   --   --   --   --   --  0.92  --   TROPONINIHS  --    < >   < >  --   --  1,056*  --  1,061* 860*  --   --   --   --    < > = values in this interval not displayed.    Estimated Creatinine Clearance: 107.3 mL/min (by C-G formula based on SCr of 0.92 mg/dL).   Assessment: 65 YOM who presented on 9/19 with SOB and CP/NSTEMI. Transferred to [facility] for cardiac evaluation and cath. Pharmacy consulted for Heparin dosing.   The patient's heparin level this morning continues to be therapeutic. No bleeding issues noted per RN.   Goal of Therapy:  Heparin level 0.3-0.7 units/ml Monitor platelets by anticoagulation protocol: Yes   Plan:  - Continue Heparin at 1500 units/hr (15 ml/hr) - Will continue to monitor for any signs/symptoms of bleeding and will follow up with a confirmatory heparin level with AM labs  Thank you for allowing pharmacy to be a part of this patient's care.  [facility] PharmD., BCPS Clinical Pharmacist 01/24/2020 9:55 AM  **Pharmacist  phone directory can now be found on [email or web address] ([email or web address]).  Listed under [facility].  Addendum: MV CAD on cath. Restart heparin tonight.

## 2020-01-24 NOTE — Assessment & Plan Note (Addendum)
--  Status post left heart cath showing severe three-vessel disease, moderate pulmonary artery hypertension. --Management deferred to cardiology. Considering CABG.

## 2020-01-24 NOTE — Progress Notes (Addendum)
Progress Note  Patient Name: Douglas Carr Date of Encounter: 01/24/2020  [facility] Cardiologist: Nanetta Batty, MD   Subjective   No chest pain. Breathing has improved.   Inpatient Medications    Scheduled Meds: . aspirin EC  81 mg Oral Daily  . atorvastatin  80 mg Oral Daily  . Chlorhexidine Gluconate Cloth  6 each Topical Daily  . clopidogrel  75 mg Oral Daily  . DULoxetine  60 mg Oral Daily  . insulin aspart  0-9 Units Subcutaneous Q4H  . mirtazapine  7.5 mg Oral QHS  . primidone  50 mg Oral QHS  . sodium chloride flush  10-40 mL Intracatheter Q12H  . sodium chloride flush  3 mL Intravenous Q12H  . sodium chloride flush  3 mL Intravenous Q12H  . tamsulosin  0.4 mg Oral QPC supper   Continuous Infusions: . sodium chloride    . sodium chloride    . sodium chloride 100 mL/hr at 01/24/20 0830  . heparin 1,500 Units/hr (01/24/20 0800)   PRN Meds: sodium chloride, sodium chloride, acetaminophen, dextrose, morphine injection, nitroGLYCERIN, ondansetron (ZOFRAN) IV, sodium chloride flush, sodium chloride flush, sodium chloride flush   Vital Signs    Vitals:   01/24/20 0300 01/24/20 0400 01/24/20 0700 01/24/20 0800  BP: 100/67  123/82 112/75  Pulse: 73  78 74  Resp: 10  16 10   Temp:  97.7 F (36.5 C)    TempSrc:  Oral    SpO2: 97%  95% 93%  Weight:      Height:        Intake/Output Summary (Last 24 hours) at 01/24/2020 0856 Last data filed at 01/24/2020 0800 Gross per 24 hour  Intake 985.49 ml  Output --  Net 985.49 ml   Last 3 Weights 01/23/2020 01/22/2020 09/27/2019  Weight (lbs) 208 lb 12.4 oz 200 lb 198 lb 4 oz  Weight (kg) 94.7 kg 90.719 kg 89.926 kg      Telemetry    sinus- Personally Reviewed  ECG    No AM EKG - Personally Reviewed  Physical Exam   GEN: No acute distress.   Neck: No JVD Cardiac: RRR, no murmurs, rubs, or gallops.  Respiratory: Clear to auscultation bilaterally. GI: Soft, nontender, non-distended  MS: No edema;  No deformity. Neuro:  Nonfocal  Psych: Normal affect   Labs    High Sensitivity Troponin:   Recent Labs  Lab 01/22/20 2221 01/23/20 0122 01/23/20 0415 01/23/20 0849 01/23/20 1200  TROPONINIHS 880* 1,025* 1,056* 1,061* 860*      Chemistry Recent Labs  Lab 01/22/20 1614 01/23/20 0123 01/24/20 0434  NA 128* 133* 136  K 5.5* 4.0 3.9  CL 97* 101 103  CO2 22 24 25   GLUCOSE 570* 244* 112*  BUN 24* 26* 32*  CREATININE 1.03 0.91 0.92  CALCIUM 8.3* 8.2* 8.1*  PROT 7.2  --   --   ALBUMIN 3.3*  --   --   AST 28  --   --   ALT 38  --   --   ALKPHOS 92  --   --   BILITOT 0.6  --   --   GFRNONAA >60 >60 >60  GFRAA >60 >60 >60  ANIONGAP 9 8 8      Hematology Recent Labs  Lab 01/22/20 1614 01/22/20 1614 01/23/20 0123 01/23/20 0706 01/24/20 0434  WBC 11.5*  --  9.9  --  9.6  RBC 5.18  --  4.59  --  4.36  HGB 14.7   < > 13.0 12.5* 12.5*  HCT 45.5   < > 40.3 39.4 38.7*  MCV 87.8  --  87.8  --  88.8  MCH 28.4  --  28.3  --  28.7  MCHC 32.3  --  32.3  --  32.3  RDW 12.9  --  12.7  --  13.0  PLT 207  --  179  --  194   < > = values in this interval not displayed.    BNP Recent Labs  Lab 01/22/20 1614  BNP 788.0*     DDimer No results for input(s): DDIMER in the last 168 hours.   Radiology    DG CHEST PORT 1 VIEW  Result Date: 01/23/2020 CLINICAL DATA:  Chest pain EXAM: PORTABLE CHEST 1 VIEW COMPARISON:  01/22/2020 FINDINGS: Lung volumes are slightly small, however, pulmonary insufflation remain stable since prior examination. Moderate left pleural effusion appears slightly enlarged, partially loculated laterally. Perihilar interstitial pulmonary edema persists, stable since prior examination, possibly cardiogenic in nature. Benign calcified granuloma again noted at the right lung base. No pneumothorax. Cardiac size within normal limits. Left subclavian pacemaker defibrillator unchanged. IMPRESSION: Stable moderate perihilar interstitial pulmonary edema, possibly  cardiogenic in nature. Increasing, moderate, partially laterally loculated left pleural effusion. Electronically Signed   By: Ashesh  Parikh MD   On: 01/23/2020 07:01   DG Chest Portable 1 View  Result Date: 01/22/2020 CLINICAL DATA:  54-year-old male with shortness of breath for 4 days. Recently tested negative for COVID-19. EXAM: PORTABLE CHEST 1 VIEW COMPARISON:  Portable chest 05/20/2017 and earlier. FINDINGS: Portable AP upright view at 1628 hours. Perihilar and basilar predominant bilateral increased interstitial markings. Veiling opacity at the left lung base compatible with small pleural effusion. Possible trace right pleural effusion. No pneumothorax. Mediastinal contours remain normal. Chronic left chest cardiac AICD. Chronic calcified granulomas in the right lung. Visualized tracheal air column is within normal limits. No acute osseous abnormality identified. IMPRESSION: Increased bilateral pulmonary interstitial markings and left pleural effusion. Favor acute pulmonary edema. Electronically Signed   By: H  Hall M.D.   On: 01/22/2020 16:37   ECHOCARDIOGRAM COMPLETE  Result Date: 01/23/2020    ECHOCARDIOGRAM REPORT   Patient Name:   Douglas Carr Date of Exam: 01/23/2020 Medical Rec #:  [id number]           Height:       70.0 in Accession #:    [id number]          Weight:       200.0 lb Date of Birth:  01/06/1966           BSA:          2.087 m Patient Age:    54 years            BP:           108/58 mmHg Patient Gender: M                   HR:           72 bpm. Exam Location:  [place] Procedure: 2D Echo Indications:    Dyspnea 786.09 / R06.00  History:        Patient has prior history of Echocardiogram examinations, most                 recent 04/13/2018. CAD and Previous Myocardial Infarction; Risk                   Factors:Hypertension, Diabetes, Dyslipidemia and Former Smoker.                 Dilated Cardiomyopathy.  Sonographer:    Jeryl Columbia RDCS (AE) Referring Phys: [id number]  Douglas Carr IMPRESSIONS  1. Left ventricular ejection fraction, by estimation, is 30%. The left ventricle has moderately decreased function. The left ventricle demonstrates global hypokinesis. Left ventricular diastolic parameters are consistent with Grade III diastolic dysfunction (restrictive). Elevated left atrial pressure.  2. Right ventricular systolic function is normal. The right ventricular size is normal.  3. Left atrial size was moderately dilated.  4. A small pericardial effusion is present. The pericardial effusion is circumferential. Moderate pleural effusion in the left lateral region.  5. The mitral valve is normal in structure. Mild mitral valve regurgitation. No evidence of mitral stenosis.  6. The aortic valve is tricuspid. Aortic valve regurgitation is not visualized. No aortic stenosis is present.  7. The inferior vena cava is normal in size with greater than 50% respiratory variability, suggesting right atrial pressure of 3 mmHg. FINDINGS  Left Ventricle: Left ventricular ejection fraction, by estimation, is 30%. The left ventricle has moderately decreased function. The left ventricle demonstrates global hypokinesis. The left ventricular internal cavity size was normal in size. There is no left ventricular hypertrophy. Left ventricular diastolic parameters are consistent with Grade III diastolic dysfunction (restrictive). Elevated left atrial pressure. Right Ventricle: The right ventricular size is normal. No increase in right ventricular wall thickness. Right ventricular systolic function is normal. Left Atrium: Left atrial size was moderately dilated. Right Atrium: Right atrial size was normal in size. Pericardium: A small pericardial effusion is present. The pericardial effusion is circumferential. Mitral Valve: The mitral valve is normal in structure. Mild mitral valve regurgitation. No evidence of mitral valve stenosis. Tricuspid Valve: The tricuspid valve is normal in structure.  Tricuspid valve regurgitation is not demonstrated. No evidence of tricuspid stenosis. Aortic Valve: The aortic valve is tricuspid. Aortic valve regurgitation is not visualized. No aortic stenosis is present. Aortic valve mean gradient measures 1.5 mmHg. Aortic valve peak gradient measures 2.4 mmHg. Aortic valve area, by VTI measures 2.01 cm. Pulmonic Valve: The pulmonic valve was not well visualized. Pulmonic valve regurgitation is not visualized. No evidence of pulmonic stenosis. Aorta: The aortic root is normal in size and structure. Pulmonary Artery: Indeterminate PASP, inadequate TR jet. Venous: The inferior vena cava is normal in size with greater than 50% respiratory variability, suggesting right atrial pressure of 3 mmHg. IAS/Shunts: No atrial level shunt detected by color flow Doppler. Additional Comments: A pacer wire is visualized. There is a moderate pleural effusion in the left lateral region.  LEFT VENTRICLE PLAX 2D LVIDd:         5.64 cm  Diastology LVIDs:         4.98 cm  LV e' medial:    4.40 cm/s LV PW:         1.20 cm  LV E/e' medial:  16.8 LV IVS:        1.08 cm  LV e' lateral:   8.45 cm/s LVOT diam:     2.10 cm  LV E/e' lateral: 8.8 LV SV:         28 LV SV Index:   14 LVOT Area:     3.46 cm  RIGHT VENTRICLE RV S prime:     8.27 cm/s TAPSE (M-mode): 1.7 cm LEFT ATRIUM             Index  RIGHT ATRIUM           Index LA diam:        3.50 cm 1.68 cm/m  RA Area:     14.30 cm LA Vol (A2C):   64.7 ml 31.00 ml/m RA Volume:   38.10 ml  18.25 ml/m LA Vol (A4C):   80.1 ml 38.37 ml/m LA Biplane Vol: 76.8 ml 36.79 ml/m  AORTIC VALVE AV Area (Vmax):    2.18 cm AV Area (Vmean):   1.99 cm AV Area (VTI):     2.01 cm AV Vmax:           78.18 cm/s AV Vmean:          59.054 cm/s AV VTI:            0.141 m AV Peak Grad:      2.4 mmHg AV Mean Grad:      1.5 mmHg LVOT Vmax:         49.10 cm/s LVOT Vmean:        34.000 cm/s LVOT VTI:          0.082 m LVOT/AV VTI ratio: 0.58  AORTA Ao Root diam: 2.90 cm  MITRAL VALVE MV Area (PHT): 4.17 cm    SHUNTS MV Decel Time: 182 msec    Systemic VTI:  0.08 m MR Peak grad: 59.3 mmHg    Systemic Diam: 2.10 cm MR Mean grad: 43.0 mmHg MR Vmax:      385.00 cm/s MR Vmean:     313.0 cm/s MV E velocity: 74.10 cm/s MV A velocity: 20.60 cm/s MV E/A ratio:  3.60 Dina Rich MD Electronically signed by Dina Rich MD Signature Date/Time: 01/23/2020/10:49:30 AM    Final    [place] EKG SITE RITE  Result Date: 01/23/2020 If Site Rite image not attached, placement could not be confirmed due to current cardiac rhythm.   Cardiac Studies     Patient Profile     54 y.o. male with history of ICM, CAD (MI at age 11), HTN, HLD, DM2, and ICDwho was seen at [facility] by Dr. Wyline Mood on 9/20. He was transferred to [facility] 01/23/20 for cardiac cath.   Assessment & Plan    1. CAD/NSTEMI: Plans for cardiac cath today. No chest pain this am. Continue IV heparin. Continue DAPT with ASA and Plavix.  I have reviewed the risks, indications, and alternatives to cardiac catheterization, possible angioplasty, and stenting with the patient. Risks include but are not limited to bleeding, infection, vascular injury, stroke, myocardial infection, arrhythmia, kidney injury, radiation-related injury in the case of prolonged fluoroscopy use, emergency cardiac surgery, and death. The patient understands the risks of serious complication is 1-2 in 1000 with diagnostic cardiac cath and 1-2% or less with angioplasty/stenting.  2. Acute on chronic systolic CHF: No volume overload on exam. Lasix on hold. Will plan right and left heart cath today and make further decisions on diuresis based on his pressures.   3. Ischemic cardiomyopathy: Beta blocker on hold. Will need to plan on resuming his beta blocker and Ace-inh prior to discharge.   4. DM: Blood sugars reviewed and controlled. Continue sliding scale insulin.   5. HLD: Continue statin. Praluent as outpatient. Follows in lipid clinic.   For  questions or updates, please contact [facility] Please consult [email or web address] for contact info under        Signed, Verne Carrow, MD  01/24/2020, 8:56 AM

## 2020-01-24 NOTE — Hospital Course (Addendum)
54 year old man PMH including ischemic cardiomyopathy, systolic CHF and diabetes presented with shortness of breath and chest pain. Admitted for acute decompensated systolic CHF, NSTEMI.  Transferred to [facility] under cardiology service for heart catheterization.  Developed hypotension requiring Levophed. TRH managing DM.

## 2020-01-24 NOTE — Progress Notes (Signed)
Spoke with Excell Seltzer MD about patient feeling SOB and anxious. Orders received to discontinue his post-cath IV fluids, give him 40 mg IV lasix, and 5 mg p.o. valium.

## 2020-01-24 NOTE — Assessment & Plan Note (Signed)
--  1 episode of mild hypoglycemia, otherwise blood sugars well controlled and less than 200 despite being off very high dose long-acting insulin. --At this point continue sliding scale, resume long-acting insulin as blood sugars improve

## 2020-01-24 NOTE — H&P (Signed)
Cardiology Admission History and Physical:   Patient ID: CHARES Carr MRN: [id number]; DOB: 1965-11-20   Admission date: 01/22/2020  Primary Care Provider: [name], Fransisca Kaufmann, MD [facility] Cardiologist: Quay Burow, MD  [facility] Electrophysiologist:  Will Meredith Leeds, MD   Chief Complaint:  Chest pain  Patient Profile:   Douglas Carr is a 54 y.o. male with a hx of ICM, CAD (MI at age 72), HTN, HLD (followed by Dr. Debara Pickett) and diabetes-2, and ICD who is being seen today for the evaluation of NSTEMI and acute CHF at the request of Dr. Benny Lennert.  History of Present Illness:   Per Dr. Nelly Laurence Note:  [name] has hx of MI at age 29, with occluded vessels and collaterals, medical therapy.  In 2010 cath without significant findings.   STEMI 05/07/2016 with 2nd OM received DES.  Then ICM with EF 30%-diuresed.  05/24/16 with cardiac arrest with rapid ROSC emergent cath with 90% stenosis at the origin of the previous stented vessel with 70% diffuse proximal AV groove circumflex stenosis which was stented by Dr. Ellyn Hack.    He did have ICD placed with Dr. Curt Bears for secondary prevention of sudden death.  Last remote device check 07/12/19 with normal device function.   His lipids were uncontrolled and was referred to Dr. Debara Pickett.  Recommended to resume statin and Praluent.    Hx of Rt CEA and subsequent occlusion.  Last dopplers 07/20/19  Rt ICA with total occlusion ECA occluded.  Lt carotid with 40-59% stenosis - stable Bilateral vertebral arteries demonstrate an occlusion Bilateral subclavian arteries were occluded. Left subclavian artery flow was disturbed. Normal flow hemodynamics were seen in the right subclavian artery.   Pt presented 01/22/20 with hyperglycemia.  Had felt bad for 4 days -glucose was 570.  Also with dyspnea, progressive over 4 days + chest pain/tightness over 4 days but increased last pm.  No edema Pt had stopped medications a few months ago - no  particular reason.  Not been taking his insulin.    EKG:  The EKG was personally reviewed and demonstrates:  SR with LBBB new LBBB Telemetry:  Telemetry was personally reviewed and demonstrates:  SR  Troponin hs 1013, 886, 880, 1025; 1056  BNP 788 HGB A1C 14  HGB 13, plts 179 WBC 9.9 Na 133, K+ 4.0, BUN 26 and Cr 0.91  PCXR 1st:  IMPRESSION: Increased bilateral pulmonary interstitial markings and left pleural effusion. Favor acute pulmonary edema PCXR second:  IMPRESSION: Stable moderate perihilar interstitial pulmonary edema, possibly cardiogenic in nature. Increasing, moderate, partially laterally loculated left pleural Effusion  Currently on IV lasix rec'd 40 then 60 mg. and IV heparin.  Has rec'd po asa He tells me he feels better now. Can breathe better. Can take a deep breath.   BP 108/58 now - placed on coreg 6.25 mg BID with acute HF will decrease to 3.125 mg BID, he has not had recently.    Overnight: Transferred to [facility]. Chest pain free, comfortable on arrival. NPO At [place] for possible LHC.    Past Medical History:  Diagnosis Date  . Acute ST elevation myocardial infarction (STEMI) involving left circumflex coronary artery ([place]) 05/07/2016   PCI to Cx-OM  . Anginal pain ([place])    secondary to sm. vessel disease  . Anxiety   . Arthritis    BIL KNEE PAIN AND BIL ANKLE PAIN  . Bone spur of ankle   . Cardiac arrest ([place]) 05/24/2016   with v  fib  . 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 Coronary artery disease involving native coronary artery of native heart with angina pectoris ([place]) 05/24/2016   Remote MI at 54 years of age, Last cath 2010-diffuse non-obstructive disease; last echo 06/16/08 -normal LV function, moderate concentric hypertrophy; nuc 08/2008 no ischemia;  medical therapy; STEMI May 07 2016 - PCI to Cx-OM  . Diabetes mellitus    ON ORAL MEDICATION AND INSULIN  . Dilated cardiomyopathy ([place]) 05/24/2016     EF 325-30% by Echo post STEMI (previously 30-35%)   . Hyperlipidemia   . Hypertension   . Myocardial infarction ([place]) 1996   Post MI  . Peripheral vascular disease ([place])    HAS LEFT CAROTID ARTERY STENOSIS   AND IS S/P RIGHT CAROTID ENDARTERECTOMY 2010 Last carotid dopplers 01/08/2012 wth patent endarterectomy site  . Status post coronary artery stent placement     Past Surgical History:  Procedure Laterality Date  . APPENDECTOMY    . CARDIAC CATHETERIZATION     FEB 2010, significant branch vessel disease wth diag, marginal, PDA & PLA, nml. LV function  . CARDIAC CATHETERIZATION N/A 05/07/2016   Procedure: Left Heart Cath and Coronary Angiography;  Surgeon: Peter M Martinique, MD;  Location: [place] [facility];  Service: Cardiovascular;  Laterality: N/A;  . CARDIAC CATHETERIZATION N/A 05/07/2016   Procedure: Coronary Stent Intervention;  Surgeon: Peter M Martinique, MD;  Location: [place] [facility];  Service: Cardiovascular;  Laterality: N/A;  . CARDIAC CATHETERIZATION N/A 05/24/2016   Procedure: Left Heart Cath and Coronary Angiography;  Surgeon: [name], MD;  Location: [place] [facility];  Service: Cardiovascular;  Laterality: N/A;  . CARDIAC CATHETERIZATION N/A 05/24/2016   Procedure: Coronary Stent Intervention;  Surgeon: [name], MD;  Location: [place] [facility];  Service: Cardiovascular;  Laterality: N/A;  2.5x20 Promus to Ostial/proximal circumflex  . CAROTID ENDARTERECTOMY  09/2008   Rt CEA  . ICD IMPLANT N/A 06/30/2016   Procedure: ICD Implant;  Surgeon: Will Meredith Leeds, MD;  Location: [place] [facility];  Service: Cardiovascular;  Laterality: N/A;  . LUNG BIOPSY  2013   Bx's suggest granulomatous dz.  . RT ANKLE   2013     Medications Prior to Admission: Prior to Admission medications   Medication Sig Start Date End Date Taking? Authorizing Provider  aspirin EC 81 MG tablet Take 81 mg by mouth daily.   Yes [provider]  atorvastatin (LIPITOR) 80 MG  tablet Take 1 tablet (80 mg total) by mouth daily. 03/30/19  Yes [name], Fransisca Kaufmann, MD  blood glucose meter kit and supplies KIT Inject 1 each into the skin 4 (four) times daily as needed. Dispense based on patient and insurance preference. Use up to four times daily as directed. (FOR ICD-9 250.00, 250.01). 06/30/19  Yes [name], Fransisca Kaufmann, MD  carvedilol (COREG) 25 MG tablet Take 1 tablet (25 mg total) by mouth 2 (two) times daily. Please make overdue appt with Dr. Curt Bears and keep appt before anymore refills. Final Attempt 03/30/19  Yes [name], Fransisca Kaufmann, MD  clopidogrel (PLAVIX) 75 MG tablet TAKE 1 TABLET BY MOUTH EVERY DAY IN THE EVENING 03/30/19  Yes [name], Fransisca Kaufmann, MD  DULoxetine (CYMBALTA) 60 MG capsule Take 1 capsule (60 mg total) by mouth daily. 09/27/19  Yes [name], Fransisca Kaufmann, MD  glucose blood (ONETOUCH VERIO) test strip 1 each by Other route in the morning, at  noon, in the evening, and at bedtime. use for testing 06/30/19  Yes [name], Fransisca Kaufmann, MD  ibuprofen (ADVIL,MOTRIN) 200 MG tablet Take 200 mg by mouth every 6 (six) hours as needed for moderate pain.   Yes [provider]  insulin degludec (TRESIBA FLEXTOUCH) 200 UNIT/ML FlexTouch Pen Inject 116 Units into the skin daily. Patient taking differently: Inject 160 Units into the skin daily.  09/27/19  Yes [name], Fransisca Kaufmann, MD  Insulin Pen Needle (B-D ULTRAFINE III SHORT PEN) 31G X 8 MM MISC USE TO INJECT INSULIN DAILY DX E11.9 08/06/18  Yes [name], Fransisca Kaufmann, MD  lisinopril (ZESTRIL) 2.5 MG tablet Take 1 tablet (2.5 mg total) by mouth daily. 03/30/19  Yes [name], Fransisca Kaufmann, MD  metFORMIN (GLUCOPHAGE) 500 MG tablet Take 1 tablet (500 mg total) by mouth 2 (two) times daily. 03/30/19  Yes [name], Fransisca Kaufmann, MD  mirtazapine (REMERON) 7.5 MG tablet TAKE 1 TABLET AT BEDTIME FOR DEPRESSION 03/30/19  Yes [name], Fransisca Kaufmann, MD  OneTouch Delica Lancets 17G MISC Test blood sugars three times daily 09/30/19  Yes [name],  Fransisca Kaufmann, MD  potassium chloride SA (KLOR-CON M20) 20 MEQ tablet Take 1 tablet (20 mEq total) by mouth daily. 07/01/18  Yes [name], Fransisca Kaufmann, MD  primidone (MYSOLINE) 50 MG tablet TAKE 1 TABLET BY MOUTH EVERYDAY AT BEDTIME Patient taking differently: Take 50 mg by mouth at bedtime.  04/05/18  Yes Tat, Eustace Quail, DO  Semaglutide,0.25 or 0.5MG/DOS, (OZEMPIC, 0.25 OR 0.5 MG/DOSE,) 2 MG/1.5ML SOPN Inject 0.5 mg into the skin once a week. Patient taking differently: Inject 0.5 mg into the skin every 14 (fourteen) days.  09/27/19  Yes [name], Fransisca Kaufmann, MD  tamsulosin (FLOMAX) 0.4 MG CAPS capsule TAKE 1 CAPSULE DAILY IN THE EVENING 03/30/19  Yes [name], Fransisca Kaufmann, MD  topiramate (TOPAMAX) 25 MG tablet Take 1 tablet (25 mg total) by mouth at bedtime as needed. 09/27/19  Yes [name], Fransisca Kaufmann, MD  torsemide (DEMADEX) 20 MG tablet Take 1 tablet (20 mg total) by mouth daily. (Needs to be seen 03/30/19  Yes [name], Fransisca Kaufmann, MD  Alirocumab (PRALUENT) 150 MG/ML SOAJ Inject 150 mg into the skin every 14 (fourteen) days. Patient not taking: Reported on 09/27/2019 05/23/19   Lorretta Harp, MD  nitroGLYCERIN (NITROSTAT) 0.4 MG SL tablet PLACE 1 TABLET (0.4 MG TOTAL) UNDER THE TONGUE EVERY 5 (FIVE) MINUTES AS NEEDED FOR CHEST PAIN. 11/25/18   [name], Fransisca Kaufmann, MD     Allergies:   No Known Allergies  Social History:   Social History   Socioeconomic History  . Marital status: Married    Spouse name: [name]  . Number of children: 3  . Years of education: Not on file  . Highest education level: Not on file  Occupational History  . Occupation: Agricultural consultant for [employer]    Employer: [employer]   Tobacco Use  . Smoking status: Former Smoker    Packs/day: 2.00    Years: 22.00    Pack years: 44.00    Types: Cigarettes    Quit date: 10/23/2011    Years since quitting: 8.2  . Smokeless tobacco: Current User    Types: Snuff  Vaping Use  . Vaping Use: Never used  Substance and Sexual Activity  . Alcohol use:  No  . Drug use: No  . Sexual activity: Not on file  Other Topics Concern  . Not on file  Social History Narrative   Pt lives with family in  [place], [place].   Social Determinants of Health   Financial Resource Strain:   . Difficulty of Paying Living Expenses: Not on file  Food Insecurity:   . Worried About Charity fundraiser in the Last Year: Not on file  . Ran Out of Food in the Last Year: Not on file  Transportation Needs:   . Lack of Transportation (Medical): Not on file  . Lack of Transportation (Non-Medical): Not on file  Physical Activity:   . Days of Exercise per Week: Not on file  . Minutes of Exercise per Session: Not on file  Stress:   . Feeling of Stress : Not on file  Social Connections:   . Frequency of Communication with Friends and Family: Not on file  . Frequency of Social Gatherings with Friends and Family: Not on file  . Attends Religious Services: Not on file  . Active Member of Clubs or Organizations: Not on file  . Attends Archivist Meetings: Not on file  . Marital Status: Not on file  Intimate Partner Violence:   . Fear of Current or Ex-Partner: Not on file  . Emotionally Abused: Not on file  . Physically Abused: Not on file  . Sexually Abused: Not on file    Family History:   The patient's family history includes Diabetes in his mother; Heart attack in his paternal grandfather; Heart attack (age of onset: 15) in his father; Hypertension in his maternal grandfather and mother.    ROS:  Please see the history of present illness.  All other ROS reviewed and negative.     Physical Exam/Data:   Vitals:   01/23/20 2100 01/23/20 2200 01/23/20 2300 01/24/20 0000  BP: (!) 143/89 107/70 140/74 101/66  Pulse: 73 70 79 77  Resp: 18 11 19 12   Temp:    97.7 F (36.5 C)  TempSrc:    Oral  SpO2: 99% 95% 95% 94%  Weight:      Height:        Intake/Output Summary (Last 24 hours) at 01/24/2020 0051 Last data filed at 01/24/2020 0000 Gross per 24  hour  Intake 1866.63 ml  Output 800 ml  Net 1066.63 ml   Last 3 Weights 01/23/2020 01/22/2020 09/27/2019  Weight (lbs) 208 lb 12.4 oz 200 lb 198 lb 4 oz  Weight (kg) 94.7 kg 90.719 kg 89.926 kg     Body mass index is 29.96 kg/m.  General:  Well nourished, well developed, in no acute distress HEENT: normal Neck: no JVD Vascular: No carotid bruits; FA pulses 2+ bilaterally without bruits  Cardiac:  normal S1, S2; RRR; no murmur  Lungs:  clear to auscultation bilaterally, no wheezing, rhonchi or rales  Abd: soft, nontender, no hepatomegaly  Ext: no edema Musculoskeletal:  No deformities, BUE and BLE strength normal and equal Skin: warm and dry  Neuro:  CNs 2-12 intact, no focal abnormalities noted Psych:  Normal affect   EKG:  The ECG that was done SR, hyperacute T waves across the precordium that resolve temporally.  Relevant CV Studies: TTE 9/20: 1. Left ventricular ejection fraction, by estimation, is 30%. The left  ventricle has moderately decreased function. The left ventricle  demonstrates global hypokinesis. Left ventricular diastolic parameters are  consistent with Grade III diastolic  dysfunction (restrictive). Elevated left atrial pressure.  2. Right ventricular systolic function is normal. The right ventricular  size is normal.  3. Left atrial size was moderately dilated.  4. A small pericardial effusion is  present. The pericardial effusion is  circumferential. Moderate pleural effusion in the left lateral region.  5. The mitral valve is normal in structure. Mild mitral valve  regurgitation. No evidence of mitral stenosis.  6. The aortic valve is tricuspid. Aortic valve regurgitation is not  visualized. No aortic stenosis is present.  7. The inferior vena cava is normal in size with greater than 50%  respiratory variability, suggesting right atrial pressure of 3 mmHg.    cardiac cath 05/24/16  Ost Cx to Mid Cx lesion, 70 %stenosed leading into OM2 as the main  trunk of the Circumflex.  Ost 2nd Mrg to 2nd Mrg recent Promus DES 2.5 x 24 stent, - focal 90 %stenosed with what appears to be proximal edge dissection with thrombus  A STENT PROMUS PREM MR 2.5X20 drug eluting stent was successfully placed from Ostium of Circumflex into OM2, and overlaps previously placed stent.  Post intervention, there is a 0% residual stenosis.  ____________________________________________________  Colon Flattery LAD to Prox LAD lesion, 30 %stenosed.  Ost 1st Diag to 1st Diag lesion, 70 %stenosed.  Ost 1st Mrg to 1st Mrg lesion, 35 %stenosed. Ost 2nd Diag to 2nd Diag lesion, 50 %stenosed.  Prox RCA to Mid RCA lesion, 65 %stenosed - lesion appears similar to prior cath, with mild progression.  Very distal RPDA lesion, 100 %stenosed.  LV end diastolic pressure is severely elevated.  There is no aortic valve stenosis.   Status post what is most likely VF or VT arrest 2 weeks post STEMI with PCI to the circumflex.  His EKG itself did not show signs of ST elevation, however given his recent STEMI, known cardiac myopathy and presentation with cardiac arrest, I felt it prudent taken to the Cath Lab.  Angiographically there appeared to be a possible proximal edge dissection with thrombus in this lesion, however there is TIMI 2 flow. This lesion was treated with an overlapping proximal stent up to the ostium of the circumflex and OM 2. Angiographically there was good result. It is quite possible at least 1 and strut is into the left main, but non flow-limiting.  Plan:  He'll be admitted to the CCU on hypothermia protocol per PCCM  Continue amiodarone overnight as he appears to have converted into sinus rhythm  Hold ARB and Imdur for now. We'll try to start beta blocker.  Diagnostic Dominance: Right  Intervention     Laboratory Data:  High Sensitivity Troponin:   Recent Labs  Lab 01/22/20 2221 01/23/20 0122 01/23/20 0415 01/23/20 0849 01/23/20 1200    TROPONINIHS 880* 1,025* 1,056* 1,061* 860*      Chemistry Recent Labs  Lab 01/22/20 1614 01/23/20 0123  NA 128* 133*  K 5.5* 4.0  CL 97* 101  CO2 22 24  GLUCOSE 570* 244*  BUN 24* 26*  CREATININE 1.03 0.91  CALCIUM 8.3* 8.2*  GFRNONAA >60 >60  GFRAA >60 >60  ANIONGAP 9 8    Recent Labs  Lab 01/22/20 1614  PROT 7.2  ALBUMIN 3.3*  AST 28  ALT 38  ALKPHOS 92  BILITOT 0.6   Hematology Recent Labs  Lab 01/22/20 1614 01/22/20 1614 01/23/20 0123 01/23/20 0706  WBC 11.5*  --  9.9  --   RBC 5.18  --  4.59  --   HGB 14.7   < > 13.0 12.5*  HCT 45.5   < > 40.3 39.4  MCV 87.8  --  87.8  --   MCH 28.4  --  28.3  --  MCHC 32.3  --  32.3  --   RDW 12.9  --  12.7  --   PLT 207  --  179  --    < > = values in this interval not displayed.   BNP Recent Labs  Lab 01/22/20 1614  BNP 788.0*    DDimer No results for input(s): DDIMER in the last 168 hours.   Radiology/Studies:  DG CHEST PORT 1 VIEW  Result Date: 01/23/2020 CLINICAL DATA:  Chest pain EXAM: PORTABLE CHEST 1 VIEW COMPARISON:  01/22/2020 FINDINGS: Lung volumes are slightly small, however, pulmonary insufflation remain stable since prior examination. Moderate left pleural effusion appears slightly enlarged, partially loculated laterally. Perihilar interstitial pulmonary edema persists, stable since prior examination, possibly cardiogenic in nature. Benign calcified granuloma again noted at the right lung base. No pneumothorax. Cardiac size within normal limits. Left subclavian pacemaker defibrillator unchanged. IMPRESSION: Stable moderate perihilar interstitial pulmonary edema, possibly cardiogenic in nature. Increasing, moderate, partially laterally loculated left pleural effusion. Electronically Signed   By: Fidela Salisbury MD   On: 01/23/2020 07:01   ECHOCARDIOGRAM COMPLETE  Result Date: 01/23/2020    ECHOCARDIOGRAM REPORT   Patient Name:   STIRLING ORTON Date of Exam: 01/23/2020 Medical Rec #:  [id number]            Height:       70.0 in Accession #:    [id number]          Weight:       200.0 lb Date of Birth:  [birth year]           BSA:          2.087 m Patient Age:    63 years            BP:           108/58 mmHg Patient Gender: M                   HR:           72 bpm. Exam Location:  [facility] Procedure: 2D Echo Indications:    Dyspnea 786.09 / R06.00  History:        Patient has prior history of Echocardiogram examinations, most                 recent 04/13/2018. CAD and Previous Myocardial Infarction; Risk                 Factors:Hypertension, Diabetes, Dyslipidemia and Former Smoker.                 Dilated Cardiomyopathy.  Sonographer:    Leavy Cella RDCS (AE) Referring Phys: [id number] [place]  1. Left ventricular ejection fraction, by estimation, is 30%. The left ventricle has moderately decreased function. The left ventricle demonstrates global hypokinesis. Left ventricular diastolic parameters are consistent with Grade III diastolic dysfunction (restrictive). Elevated left atrial pressure.  2. Right ventricular systolic function is normal. The right ventricular size is normal.  3. Left atrial size was moderately dilated.  4. A small pericardial effusion is present. The pericardial effusion is circumferential. Moderate pleural effusion in the left lateral region.  5. The mitral valve is normal in structure. Mild mitral valve regurgitation. No evidence of mitral stenosis.  6. The aortic valve is tricuspid. Aortic valve regurgitation is not visualized. No aortic stenosis is present.  7. The inferior vena cava is normal in size with greater than 50% respiratory variability, suggesting right  atrial pressure of 3 mmHg. FINDINGS  Left Ventricle: Left ventricular ejection fraction, by estimation, is 30%. The left ventricle has moderately decreased function. The left ventricle demonstrates global hypokinesis. The left ventricular internal cavity size was normal in size. There is no left  ventricular hypertrophy. Left ventricular diastolic parameters are consistent with Grade III diastolic dysfunction (restrictive). Elevated left atrial pressure. Right Ventricle: The right ventricular size is normal. No increase in right ventricular wall thickness. Right ventricular systolic function is normal. Left Atrium: Left atrial size was moderately dilated. Right Atrium: Right atrial size was normal in size. Pericardium: A small pericardial effusion is present. The pericardial effusion is circumferential. Mitral Valve: The mitral valve is normal in structure. Mild mitral valve regurgitation. No evidence of mitral valve stenosis. Tricuspid Valve: The tricuspid valve is normal in structure. Tricuspid valve regurgitation is not demonstrated. No evidence of tricuspid stenosis. Aortic Valve: The aortic valve is tricuspid. Aortic valve regurgitation is not visualized. No aortic stenosis is present. Aortic valve mean gradient measures 1.5 mmHg. Aortic valve peak gradient measures 2.4 mmHg. Aortic valve area, by VTI measures 2.01 cm. Pulmonic Valve: The pulmonic valve was not well visualized. Pulmonic valve regurgitation is not visualized. No evidence of pulmonic stenosis. Aorta: The aortic root is normal in size and structure. Pulmonary Artery: Indeterminate PASP, inadequate TR jet. Venous: The inferior vena cava is normal in size with greater than 50% respiratory variability, suggesting right atrial pressure of 3 mmHg. IAS/Shunts: No atrial level shunt detected by color flow Doppler. Additional Comments: A pacer wire is visualized. There is a moderate pleural effusion in the left lateral region.  LEFT VENTRICLE PLAX 2D LVIDd:         5.64 cm  Diastology LVIDs:         4.98 cm  LV e' medial:    4.40 cm/s LV PW:         1.20 cm  LV E/e' medial:  16.8 LV IVS:        1.08 cm  LV e' lateral:   8.45 cm/s LVOT diam:     2.10 cm  LV E/e' lateral: 8.8 LV SV:         28 LV SV Index:   14 LVOT Area:     3.46 cm  RIGHT  VENTRICLE RV S prime:     8.27 cm/s TAPSE (M-mode): 1.7 cm LEFT ATRIUM             Index       RIGHT ATRIUM           Index LA diam:        3.50 cm 1.68 cm/m  RA Area:     14.30 cm LA Vol (A2C):   64.7 ml 31.00 ml/m RA Volume:   38.10 ml  18.25 ml/m LA Vol (A4C):   80.1 ml 38.37 ml/m LA Biplane Vol: 76.8 ml 36.79 ml/m  AORTIC VALVE AV Area (Vmax):    2.18 cm AV Area (Vmean):   1.99 cm AV Area (VTI):     2.01 cm AV Vmax:           78.18 cm/s AV Vmean:          59.054 cm/s AV VTI:            0.141 m AV Peak Grad:      2.4 mmHg AV Mean Grad:      1.5 mmHg LVOT Vmax:         49.10 cm/s  LVOT Vmean:        34.000 cm/s LVOT VTI:          0.082 m LVOT/AV VTI ratio: 0.58  AORTA Ao Root diam: 2.90 cm MITRAL VALVE MV Area (PHT): 4.17 cm    SHUNTS MV Decel Time: 182 msec    Systemic VTI:  0.08 m MR Peak grad: 59.3 mmHg    Systemic Diam: 2.10 cm MR Mean grad: 43.0 mmHg MR Vmax:      385.00 cm/s MR Vmean:     313.0 cm/s MV E velocity: 74.10 cm/s MV A velocity: 20.60 cm/s MV E/A ratio:  3.60 Carlyle Dolly MD Electronically signed by Carlyle Dolly MD Signature Date/Time: 01/23/2020/10:49:30 AM    Final    [place] EKG SITE RITE  Result Date: 01/23/2020 If Site Rite image not attached, placement could not be confirmed due to current cardiac rhythm.      TIMI Risk Score for Unstable Angina or Non-ST Elevation MI:   The patient's TIMI risk score is 5, which indicates a 26% risk of all cause mortality, new or recurrent myocardial infarction or need for urgent revascularization in the next 14 days.   New York Heart Association (NYHA) Functional Class NYHA Class II  Assessment and Plan:   1. NSTEMI with known CAD and chest discomfort associated with acute CHF.  - on IV heparin.  Transfer to [facility] to continue to diuresis and then cardiac cath.  troponins elevated.  Pt had been off medications. On ASA and Plavix now, lisinopril on hold.  2. Acute systolic HF with last Echo 2018 with EF 25-30%  Also with G2DD. Now  with acute HF (BNP 788) and chest pain/NSTEMI --diuresis continue, he is neg 1000 cc.  Echo pending -decrease dose of BB as pt not been taking for sometime and now acute HF and BP low--coreg now stopped. 3. DM-2 insulin dependant with hyperglycemia on arrival per IM, A1C was 14 4. HLD on statin and pravulent continue lipitor 80 mg here and add pravulent as outpt.    Severity of Illness: The appropriate patient status for this patient is INPATIENT. Inpatient status is judged to be reasonable and necessary in order to provide the required intensity of service to ensure the patient's safety. The patient's presenting symptoms, physical exam findings, and initial radiographic and laboratory data in the context of their chronic comorbidities is felt to place them at high risk for further clinical deterioration. Furthermore, it is not anticipated that the patient will be medically stable for discharge from the hospital within 2 midnights of admission. The following factors support the patient status of inpatient.   " The patient's presenting symptoms include chest pain. " The worrisome physical exam findings include n/a " The initial radiographic and laboratory data are worrisome because of elevated troponin " The chronic co-morbidities include HTN   * I certify that at the point of admission it is my clinical judgment that the patient will require inpatient hospital care spanning beyond 2 midnights from the point of admission due to high intensity of service, high risk for further deterioration and high frequency of surveillance required.*    For questions or updates, please contact Rachel Please consult [email or web address] for contact info under     Signed, Milus Banister, MD  01/24/2020 12:51 AM

## 2020-01-24 NOTE — H&P (View-Only) (Signed)
Progress Note  Patient Name: Douglas Carr Date of Encounter: 01/24/2020  [facility] Cardiologist: Nanetta Batty, MD   Subjective   No chest pain. Breathing has improved.   Inpatient Medications    Scheduled Meds: . aspirin EC  81 mg Oral Daily  . atorvastatin  80 mg Oral Daily  . Chlorhexidine Gluconate Cloth  6 each Topical Daily  . clopidogrel  75 mg Oral Daily  . DULoxetine  60 mg Oral Daily  . insulin aspart  0-9 Units Subcutaneous Q4H  . mirtazapine  7.5 mg Oral QHS  . primidone  50 mg Oral QHS  . sodium chloride flush  10-40 mL Intracatheter Q12H  . sodium chloride flush  3 mL Intravenous Q12H  . sodium chloride flush  3 mL Intravenous Q12H  . tamsulosin  0.4 mg Oral QPC supper   Continuous Infusions: . sodium chloride    . sodium chloride    . sodium chloride 100 mL/hr at 01/24/20 0830  . heparin 1,500 Units/hr (01/24/20 0800)   PRN Meds: sodium chloride, sodium chloride, acetaminophen, dextrose, morphine injection, nitroGLYCERIN, ondansetron (ZOFRAN) IV, sodium chloride flush, sodium chloride flush, sodium chloride flush   Vital Signs    Vitals:   01/24/20 0300 01/24/20 0400 01/24/20 0700 01/24/20 0800  BP: 100/67  123/82 112/75  Pulse: 73  78 74  Resp: 10  16 10   Temp:  97.7 F (36.5 C)    TempSrc:  Oral    SpO2: 97%  95% 93%  Weight:      Height:        Intake/Output Summary (Last 24 hours) at 01/24/2020 0856 Last data filed at 01/24/2020 0800 Gross per 24 hour  Intake 985.49 ml  Output --  Net 985.49 ml   Last 3 Weights 01/23/2020 01/22/2020 09/27/2019  Weight (lbs) 208 lb 12.4 oz 200 lb 198 lb 4 oz  Weight (kg) 94.7 kg 90.719 kg 89.926 kg      Telemetry    sinus- Personally Reviewed  ECG    No AM EKG - Personally Reviewed  Physical Exam   GEN: No acute distress.   Neck: No JVD Cardiac: RRR, no murmurs, rubs, or gallops.  Respiratory: Clear to auscultation bilaterally. GI: Soft, nontender, non-distended  MS: No edema;  No deformity. Neuro:  Nonfocal  Psych: Normal affect   Labs    High Sensitivity Troponin:   Recent Labs  Lab 01/22/20 2221 01/23/20 0122 01/23/20 0415 01/23/20 0849 01/23/20 1200  TROPONINIHS 880* 1,025* 1,056* 1,061* 860*      Chemistry Recent Labs  Lab 01/22/20 1614 01/23/20 0123 01/24/20 0434  NA 128* 133* 136  K 5.5* 4.0 3.9  CL 97* 101 103  CO2 22 24 25   GLUCOSE 570* 244* 112*  BUN 24* 26* 32*  CREATININE 1.03 0.91 0.92  CALCIUM 8.3* 8.2* 8.1*  PROT 7.2  --   --   ALBUMIN 3.3*  --   --   AST 28  --   --   ALT 38  --   --   ALKPHOS 92  --   --   BILITOT 0.6  --   --   GFRNONAA >60 >60 >60  GFRAA >60 >60 >60  ANIONGAP 9 8 8      Hematology Recent Labs  Lab 01/22/20 1614 01/22/20 1614 01/23/20 0123 01/23/20 0706 01/24/20 0434  WBC 11.5*  --  9.9  --  9.6  RBC 5.18  --  4.59  --  4.36  HGB 14.7   < > 13.0 12.5* 12.5*  HCT 45.5   < > 40.3 39.4 38.7*  MCV 87.8  --  87.8  --  88.8  MCH 28.4  --  28.3  --  28.7  MCHC 32.3  --  32.3  --  32.3  RDW 12.9  --  12.7  --  13.0  PLT 207  --  179  --  194   < > = values in this interval not displayed.    BNP Recent Labs  Lab 01/22/20 1614  BNP 788.0*     DDimer No results for input(s): DDIMER in the last 168 hours.   Radiology    DG CHEST PORT 1 VIEW  Result Date: 01/23/2020 CLINICAL DATA:  Chest pain EXAM: PORTABLE CHEST 1 VIEW COMPARISON:  01/22/2020 FINDINGS: Lung volumes are slightly small, however, pulmonary insufflation remain stable since prior examination. Moderate left pleural effusion appears slightly enlarged, partially loculated laterally. Perihilar interstitial pulmonary edema persists, stable since prior examination, possibly cardiogenic in nature. Benign calcified granuloma again noted at the right lung base. No pneumothorax. Cardiac size within normal limits. Left subclavian pacemaker defibrillator unchanged. IMPRESSION: Stable moderate perihilar interstitial pulmonary edema, possibly  cardiogenic in nature. Increasing, moderate, partially laterally loculated left pleural effusion. Electronically Signed   By: Helyn NumbersAshesh  Parikh MD   On: 01/23/2020 07:01   DG Chest Portable 1 View  Result Date: 01/22/2020 CLINICAL DATA:  [age] year old male with shortness of breath for 4 days. Recently tested negative for COVID-19. EXAM: PORTABLE CHEST 1 VIEW COMPARISON:  Portable chest 05/20/2017 and earlier. FINDINGS: Portable AP upright view at 1628 hours. Perihilar and basilar predominant bilateral increased interstitial markings. Veiling opacity at the left lung base compatible with small pleural effusion. Possible trace right pleural effusion. No pneumothorax. Mediastinal contours remain normal. Chronic left chest cardiac AICD. Chronic calcified granulomas in the right lung. Visualized tracheal air column is within normal limits. No acute osseous abnormality identified. IMPRESSION: Increased bilateral pulmonary interstitial markings and left pleural effusion. Favor acute pulmonary edema. Electronically Signed   By: Odessa FlemingH  Hall M.D.   On: 01/22/2020 16:37   ECHOCARDIOGRAM COMPLETE  Result Date: 01/23/2020    ECHOCARDIOGRAM REPORT   Patient Name:   [facility] Date of Exam: 01/23/2020 Medical Rec #:  [id number]           Height:       70.0 in Accession #:    [id number]          Weight:       200.0 lb Date of Birth:  29-May-1965           BSA:          2.087 m Patient Age:    54 years            BP:           108/58 mmHg Patient Gender: M                   HR:           72 bpm. [facility] Procedure: 2D Echo Indications:    Dyspnea 786.09 / R06.00  History:        Patient has prior history of Echocardiogram examinations, most                 recent 04/13/2018. CAD and Previous Myocardial Infarction; Risk  Factors:Hypertension, Diabetes, Dyslipidemia and Former Smoker.                 Dilated Cardiomyopathy.  Sonographer:    Jeryl Columbia RDCS (AE) Referring Phys: [id number]  York Ram ARRIEN IMPRESSIONS  1. Left ventricular ejection fraction, by estimation, is 30%. The left ventricle has moderately decreased function. The left ventricle demonstrates global hypokinesis. Left ventricular diastolic parameters are consistent with Grade III diastolic dysfunction (restrictive). Elevated left atrial pressure.  2. Right ventricular systolic function is normal. The right ventricular size is normal.  3. Left atrial size was moderately dilated.  4. A small pericardial effusion is present. The pericardial effusion is circumferential. Moderate pleural effusion in the left lateral region.  5. The mitral valve is normal in structure. Mild mitral valve regurgitation. No evidence of mitral stenosis.  6. The aortic valve is tricuspid. Aortic valve regurgitation is not visualized. No aortic stenosis is present.  7. The inferior vena cava is normal in size with greater than 50% respiratory variability, suggesting right atrial pressure of 3 mmHg. FINDINGS  Left Ventricle: Left ventricular ejection fraction, by estimation, is 30%. The left ventricle has moderately decreased function. The left ventricle demonstrates global hypokinesis. The left ventricular internal cavity size was normal in size. There is no left ventricular hypertrophy. Left ventricular diastolic parameters are consistent with Grade III diastolic dysfunction (restrictive). Elevated left atrial pressure. Right Ventricle: The right ventricular size is normal. No increase in right ventricular wall thickness. Right ventricular systolic function is normal. Left Atrium: Left atrial size was moderately dilated. Right Atrium: Right atrial size was normal in size. Pericardium: A small pericardial effusion is present. The pericardial effusion is circumferential. Mitral Valve: The mitral valve is normal in structure. Mild mitral valve regurgitation. No evidence of mitral valve stenosis. Tricuspid Valve: The tricuspid valve is normal in structure.  Tricuspid valve regurgitation is not demonstrated. No evidence of tricuspid stenosis. Aortic Valve: The aortic valve is tricuspid. Aortic valve regurgitation is not visualized. No aortic stenosis is present. Aortic valve mean gradient measures 1.5 mmHg. Aortic valve peak gradient measures 2.4 mmHg. Aortic valve area, by VTI measures 2.01 cm. Pulmonic Valve: The pulmonic valve was not well visualized. Pulmonic valve regurgitation is not visualized. No evidence of pulmonic stenosis. Aorta: The aortic root is normal in size and structure. Pulmonary Artery: Indeterminate PASP, inadequate TR jet. Venous: The inferior vena cava is normal in size with greater than 50% respiratory variability, suggesting right atrial pressure of 3 mmHg. IAS/Shunts: No atrial level shunt detected by color flow Doppler. Additional Comments: A pacer wire is visualized. There is a moderate pleural effusion in the left lateral region.  LEFT VENTRICLE PLAX 2D LVIDd:         5.64 cm  Diastology LVIDs:         4.98 cm  LV e' medial:    4.40 cm/s LV PW:         1.20 cm  LV E/e' medial:  16.8 LV IVS:        1.08 cm  LV e' lateral:   8.45 cm/s LVOT diam:     2.10 cm  LV E/e' lateral: 8.8 LV SV:         28 LV SV Index:   14 LVOT Area:     3.46 cm  RIGHT VENTRICLE RV S prime:     8.27 cm/s TAPSE (M-mode): 1.7 cm LEFT ATRIUM             Index  RIGHT ATRIUM           Index LA diam:        3.50 cm 1.68 cm/m  RA Area:     14.30 cm LA Vol (A2C):   64.7 ml 31.00 ml/m RA Volume:   38.10 ml  18.25 ml/m LA Vol (A4C):   80.1 ml 38.37 ml/m LA Biplane Vol: 76.8 ml 36.79 ml/m  AORTIC VALVE AV Area (Vmax):    2.18 cm AV Area (Vmean):   1.99 cm AV Area (VTI):     2.01 cm AV Vmax:           78.18 cm/s AV Vmean:          59.054 cm/s AV VTI:            0.141 m AV Peak Grad:      2.4 mmHg AV Mean Grad:      1.5 mmHg LVOT Vmax:         49.10 cm/s LVOT Vmean:        34.000 cm/s LVOT VTI:          0.082 m LVOT/AV VTI ratio: 0.58  AORTA Ao Root diam: 2.90 cm  MITRAL VALVE MV Area (PHT): 4.17 cm    SHUNTS MV Decel Time: 182 msec    Systemic VTI:  0.08 m MR Peak grad: 59.3 mmHg    Systemic Diam: 2.10 cm MR Mean grad: 43.0 mmHg MR Vmax:      385.00 cm/s MR Vmean:     313.0 cm/s MV E velocity: 74.10 cm/s MV A velocity: 20.60 cm/s MV E/A ratio:  3.60 Dina Rich MD Electronically signed by Dina Rich MD Signature Date/Time: 01/23/2020/10:49:30 AM    Final    [place] EKG SITE RITE  Result Date: 01/23/2020 If Site Rite image not attached, placement could not be confirmed due to current cardiac rhythm.   Cardiac Studies     Patient Profile     54 y.o. male with history of ICM, CAD (MI at age 11), HTN, HLD, DM2, and ICDwho was seen at [facility] by Dr. Wyline Mood on 9/20. He was transferred to [facility] 01/23/20 for cardiac cath.   Assessment & Plan    1. CAD/NSTEMI: Plans for cardiac cath today. No chest pain this am. Continue IV heparin. Continue DAPT with ASA and Plavix.  I have reviewed the risks, indications, and alternatives to cardiac catheterization, possible angioplasty, and stenting with the patient. Risks include but are not limited to bleeding, infection, vascular injury, stroke, myocardial infection, arrhythmia, kidney injury, radiation-related injury in the case of prolonged fluoroscopy use, emergency cardiac surgery, and death. The patient understands the risks of serious complication is 1-2 in 1000 with diagnostic cardiac cath and 1-2% or less with angioplasty/stenting.  2. Acute on chronic systolic CHF: No volume overload on exam. Lasix on hold. Will plan right and left heart cath today and make further decisions on diuresis based on his pressures.   3. Ischemic cardiomyopathy: Beta blocker on hold. Will need to plan on resuming his beta blocker and Ace-inh prior to discharge.   4. DM: Blood sugars reviewed and controlled. Continue sliding scale insulin.   5. HLD: Continue statin. Praluent as outpatient. Follows in lipid clinic.   For  questions or updates, please contact [facility] Please consult [email or web address] for contact info under        Signed, Verne Carrow, MD  01/24/2020, 8:56 AM

## 2020-01-24 NOTE — Progress Notes (Signed)
PROGRESS NOTE  Douglas Carr AST:[id number] DOB: [birth year] DOA: 01/22/2020 PCP: [name], [name], MD  Brief History   54 year old man PMH including ischemic cardiomyopathy, systolic CHF and diabetes presented with shortness of breath and chest pain. Admitted for acute decompensated systolic CHF, NSTEMI.  Transferred to [facility] under cardiology service for heart catheterization.  Developed hypotension requiring Levophed. TRH managing DM.   A & P  NSTEMI (non-ST elevated myocardial infarction) (HCC) --Status post left heart cath showing severe three-vessel disease, moderate pulmonary artery hypertension. --Management deferred to cardiology. Considering CABG.  Diabetes mellitus with complication (HCC) --1 episode of mild hypoglycemia, otherwise blood sugars well controlled and less than 200 despite being off very high dose long-acting insulin. --At this point continue sliding scale, resume long-acting insulin as blood sugars improve  Defer management of acute systolic CHF, ischemic cardiomyopathy to cardiology.   Disposition Plan:  Discussion: will adjust SSI to TID now on diet. Add back long-acting insulin as blood sugars increase.  Status is: Inpatient  DVT prophylaxis: SCD's Start: 01/24/20 1303 SCDs Start: 01/22/20 2024 management per primary service   Code Status: Full Code Family Communication: wife at bedside  Brendia Sacks, MD  [facility] Direct contact: see [email or web address] (further directions at bottom of note if needed) 7PM-7AM contact night coverage as at bottom of note 01/24/2020, 3:03 PM  LOS: 2 days   Interval History/Subjective  CC: f/u DM  Feels ok, no CP or SOB Reports CBG at home in 100s.   Objective   Vitals:  Vitals:   01/24/20 1430 01/24/20 1445  BP: (!) 141/78 124/66  Pulse: 84 81  Resp: 17 (!) 26  Temp:    SpO2: 95% 99%    Exam:  Physical Exam Constitutional:      General: He is not in acute distress. Cardiovascular:      Rate and Rhythm: Normal rate and regular rhythm.     Comments: Telemetry SR Pulmonary:     Effort: Pulmonary effort is normal. No tachypnea or respiratory distress.     Breath sounds: Normal breath sounds.  Musculoskeletal:     Right lower leg: No edema.     Left lower leg: No edema.  Neurological:     Mental Status: He is alert.  Psychiatric:        Mood and Affect: Mood normal.        Behavior: Behavior normal.     I have personally reviewed the following:   Today's Data  . CBG low at 60, otherwise stable and less than 150 . BMP unremarkable  Scheduled Meds: . [START ON 01/25/2020] aspirin  81 mg Oral Daily  . atorvastatin  80 mg Oral Daily  . atropine      . Chlorhexidine Gluconate Cloth  6 each Topical Daily  . Chlorhexidine Gluconate Cloth  6 each Topical Q0600  . DULoxetine  60 mg Oral Daily  . insulin aspart  0-9 Units Subcutaneous TID WC  . mirtazapine  7.5 mg Oral QHS  . mupirocin ointment  1 application Nasal BID  . primidone  50 mg Oral QHS  . sodium chloride flush  10-40 mL Intracatheter Q12H  . sodium chloride flush  3 mL Intravenous Q12H  . sodium chloride flush  3 mL Intravenous Q12H  . tamsulosin  0.4 mg Oral QPC supper   Continuous Infusions: . sodium chloride Stopped (01/24/20 1335)  . sodium chloride 50 mL/hr at 01/24/20 1400  . sodium chloride      Principal  Problem:   NSTEMI (non-ST elevated myocardial infarction) (HCC) Active Problems:   Essential hypertension   Chronic combined systolic and diastolic HF (heart failure), NYHA class 3 (HCC)   Coronary artery disease involving native coronary artery of native heart with angina pectoris (HCC)   Diabetes mellitus with complication (HCC)   Ischemic cardiomyopathy   Anxiety and depression   Hyperlipidemia   Heart failure (HCC)   STEMI (ST elevation myocardial infarction) (HCC)   Elevated troponin   LOS: 2 days   How to contact the [facility] Attending or Consulting provider 7A - 7P or covering  provider during after hours 7P -7A, for this patient?  1. Check the care team in [facility] and look for a) attending/consulting TRH provider listed and b) the [facility] team listed 2. Log into [email or web address] and use [facility] universal password to access. If you do not have the password, please contact the hospital operator. 3. Locate the [facility] provider you are looking for under [facility] and page to a number that you can be directly reached. 4. If you still have difficulty reaching the provider, please page the [facility] (Director on Call) for the Hospitalists listed on amion for assistance.

## 2020-01-24 NOTE — Interval H&P Note (Signed)
Cath Lab Visit (complete for each Cath Lab visit)  Clinical Evaluation Leading to the Procedure:   ACS: No.  Non-ACS:    Anginal Classification: CCS III  Anti-ischemic medical therapy: Maximal Therapy (2 or more classes of medications)  Non-Invasive Test Results: High-risk stress test findings: cardiac mortality >3%/year  Prior CABG: No previous CABG      History and Physical Interval Note:  01/24/2020 11:07 AM  Douglas Carr  has presented today for surgery, with the diagnosis of nonstemi.  The various methods of treatment have been discussed with the patient and family. After consideration of risks, benefits and other options for treatment, the patient has consented to  Procedure(s): LEFT HEART CATH AND CORONARY ANGIOGRAPHY (N/A) as a surgical intervention.  The patient's history has been reviewed, patient examined, no change in status, stable for surgery.  I have reviewed the patient's chart and labs.  Questions were answered to the patient's satisfaction.     Lyn Records III

## 2020-01-24 NOTE — Progress Notes (Signed)
PM Round: Pt c/o resting dyspnea. Feeling anxious. No chest pain. Hemodynamically stable, )2 sat 98%, Rhythm stable - normal sinus. Fluids stopped. I have given him lasix 40 mg IV x 1. Will repeat.   Pt with PICC line. Will transduce CVP and check co-ox measurements Q shift.  Cath films reviewed. Likely will need CABG once acute heart failure is improved.   Tonny Bollman 01/24/2020 6:29 PM

## 2020-01-24 NOTE — Progress Notes (Signed)
Right femoral venous sheath removed at 1338. VSS. Manual pressure held for 15 mins.

## 2020-01-24 NOTE — Progress Notes (Signed)
Notified McAlhany MD of patient complaining of being SOB. Pre-cardiac cath fluids running. Just spoke with cath lab personnel and patient will be going to lab shortly. Ordered to go ahead and stop IV fluids for now.

## 2020-01-25 ENCOUNTER — Other Ambulatory Visit: Payer: Self-pay | Admitting: *Deleted

## 2020-01-25 ENCOUNTER — Inpatient Hospital Stay (HOSPITAL_COMMUNITY): Payer: Medicare Other

## 2020-01-25 DIAGNOSIS — I25110 Atherosclerotic heart disease of native coronary artery with unstable angina pectoris: Secondary | ICD-10-CM

## 2020-01-25 DIAGNOSIS — I5042 Chronic combined systolic (congestive) and diastolic (congestive) heart failure: Secondary | ICD-10-CM

## 2020-01-25 DIAGNOSIS — I5043 Acute on chronic combined systolic (congestive) and diastolic (congestive) heart failure: Secondary | ICD-10-CM

## 2020-01-25 DIAGNOSIS — I2510 Atherosclerotic heart disease of native coronary artery without angina pectoris: Secondary | ICD-10-CM

## 2020-01-25 LAB — GLUCOSE, CAPILLARY
Glucose-Capillary: 93 mg/dL (ref 70–99)
Glucose-Capillary: 152 mg/dL — ABNORMAL HIGH (ref 70–99)
Glucose-Capillary: 112 mg/dL — ABNORMAL HIGH (ref 70–99)

## 2020-01-25 LAB — BASIC METABOLIC PANEL
Anion gap: 7 (ref 5–15)
BUN: 27 mg/dL — ABNORMAL HIGH (ref 6–20)
CO2: 27 mmol/L (ref 22–32)
Calcium: 8.2 mg/dL — ABNORMAL LOW (ref 8.9–10.3)
Chloride: 102 mmol/L (ref 98–111)
Creatinine, Ser: 0.91 mg/dL (ref 0.61–1.24)
GFR calc Af Amer: >60 mL/min (ref >60)
GFR calc non Af Amer: >60 mL/min (ref >60)
Glucose, Bld: 68 mg/dL — ABNORMAL LOW (ref 70–99)
Potassium: 3.7 mmol/L (ref 3.5–5.1)
Sodium: 136 mmol/L (ref 135–145)

## 2020-01-25 LAB — HEPARIN LEVEL (UNFRACTIONATED)
Heparin Unfractionated: 0.13 IU/mL — ABNORMAL LOW (ref 0.30–0.70)
Heparin Unfractionated: 1.48 IU/mL — ABNORMAL HIGH (ref 0.30–0.70)
Heparin Unfractionated: 0.22 IU/mL — ABNORMAL LOW (ref 0.30–0.70)
Heparin Unfractionated: 0.2 IU/mL — ABNORMAL LOW (ref 0.30–0.70)

## 2020-01-25 LAB — CBC
HCT: 39.5 % (ref 39.0–52.0)
Hemoglobin: 12.5 g/dL — ABNORMAL LOW (ref 13.0–17.0)
MCH: 27.8 pg (ref 26.0–34.0)
MCHC: 31.6 g/dL (ref 30.0–36.0)
MCV: 87.8 fL (ref 80.0–100.0)
Platelets: 202 10*3/uL (ref 150–400)
RBC: 4.5 MIL/uL (ref 4.22–5.81)
RDW: 13.1 % (ref 11.5–15.5)
WBC: 9.7 10*3/uL (ref 4.0–10.5)
nRBC: 0 % (ref 0.0–0.2)

## 2020-01-25 LAB — COOXEMETRY PANEL
Carboxyhemoglobin: 1.4 % (ref 0.5–1.5)
Methemoglobin: 1.1 % (ref 0.0–1.5)
O2 Saturation: 55.9 %
Total hemoglobin: 12.4 g/dL (ref 12.0–16.0)

## 2020-01-25 LAB — PLATELET INHIBITION P2Y12: Platelet Function  P2Y12: 287 [PRU] (ref 182–335)

## 2020-01-25 MED ORDER — LORAZEPAM 1 MG PO TABS: 1.0000 mg | ORAL_TABLET | Freq: Once | ORAL | Status: DC | PRN

## 2020-01-25 MED ORDER — LIVING WELL WITH DIABETES BOOK
Freq: Once | Status: AC
  Filled 2020-01-25: qty 1

## 2020-01-25 MED ORDER — POTASSIUM CHLORIDE CRYS ER 10 MEQ PO TBCR
10.0000 meq | EXTENDED_RELEASE_TABLET | Freq: Two times a day (BID) | ORAL | Status: DC
  Administered 2020-01-25 – 2020-01-26 (×4): 10 meq via ORAL
  Filled 2020-01-25 (×4): qty 1

## 2020-01-25 MED ORDER — DIAZEPAM 5 MG PO TABS
5.0000 mg | ORAL_TABLET | Freq: Three times a day (TID) | ORAL | Status: DC | PRN
  Administered 2020-01-26 – 2020-01-30 (×2): 5 mg via ORAL
  Filled 2020-01-25 (×2): qty 1

## 2020-01-25 NOTE — Progress Notes (Signed)
Inpatient Diabetes Program Recommendations  AACE/ADA: New Consensus Statement on Inpatient Glycemic Control (2015)  Target Ranges:  Prepandial:   less than 140 mg/dL      Peak postprandial:   less than 180 mg/dL (1-2 hours)      Critically ill patients:  140 - 180 mg/dL   Lab Results  Component Value Date   GLUCAP 152 (H) 01/25/2020   HGBA1C 14.0 (H) 01/22/2020    Review of Glycemic Control Results for Douglas Carr, Douglas Carr (MRN [id number]) as of 01/25/2020 15:27  Ref. Range 01/24/2020 11:08 01/24/2020 13:32 01/24/2020 15:46 01/25/2020 06:52 01/25/2020 11:22  Glucose-Capillary Latest Ref Range: 70 - 99 mg/dL 237 (H) 75 628 (H) 93 315 (H)   Diabetes history: DM 2 Outpatient Diabetes medications:  Tresiba 160 units daily Metformin 500 mg bid Current orders for Inpatient glycemic control:  Novolog sensitive tid with meals  Inpatient Diabetes Program Recommendations:    Note patient received Lantus 60 units on 01/23/20.  Blood sugars low on 01/24/20.   Spoke to patient and wife.  Patient admits that he was not taking his insulin at all at home.  He was unable to say how long he had not taken it.  States that he just ran out and never got Rx. Filled.  Interestingly, with much lower dose of basal insulin, patient's blood sugars dropped.  Likely will need new insulin regimen at d/c.  Discussed A1C of 14% and discussed goal A1C of 7%.  May need a portion of basal insulin restarted when blood sugars consistently>150 mg/dL.  Would start with much lower dose of basal insulin when appropriate.  Consider Lantus 25 units daily once blood sugars consistently>150 mg/dL.  Per wife and patient, he struggles with taking his insulin consistently and can be "hardheaded".  He does have glucose meter at home.  Discussed importance of glucose control post hospitalization.  They verbalized understanding.  Will order LWWD booklet and follow patient while in the hospital.   Thanks,  Beryl Meager, RN, BC-ADM Inpatient  Diabetes Coordinator Pager [phone] (8a-5p)

## 2020-01-25 NOTE — Progress Notes (Signed)
Progress Note  Patient Name: Douglas Carr Date of Encounter: 01/25/2020  [facility] Cardiologist: Nanetta Batty, MD   Subjective   Patient feeling much better today. Breathing has improved. No chest discomfort.  Inpatient Medications    Scheduled Meds: . aspirin  81 mg Oral Daily  . atorvastatin  80 mg Oral Daily  . Chlorhexidine Gluconate Cloth  6 each Topical Daily  . Chlorhexidine Gluconate Cloth  6 each Topical Q0600  . DULoxetine  60 mg Oral Daily  . furosemide  40 mg Intravenous Q12H  . insulin aspart  0-9 Units Subcutaneous TID WC  . mirtazapine  7.5 mg Oral QHS  . mupirocin ointment  1 application Nasal BID  . primidone  50 mg Oral QHS  . sodium chloride flush  10-40 mL Intracatheter Q12H  . sodium chloride flush  3 mL Intravenous Q12H  . sodium chloride flush  3 mL Intravenous Q12H  . tamsulosin  0.4 mg Oral QPC supper   Continuous Infusions: . sodium chloride Stopped (01/24/20 1335)  . sodium chloride    . heparin 1,600 Units/hr (01/25/20 0600)   PRN Meds: sodium chloride, sodium chloride, acetaminophen, diazepam, morphine injection, nitroGLYCERIN, ondansetron (ZOFRAN) IV, oxyCODONE, sodium chloride flush, sodium chloride flush, sodium chloride flush   Vital Signs    Vitals:   01/25/20 0300 01/25/20 0400 01/25/20 0500 01/25/20 0650  BP: (!) 84/55 96/70 108/75   Pulse: 78 73 72   Resp: 20 13 13    Temp: (!) 97.5 F (36.4 C)   97.6 F (36.4 C)  TempSrc: Oral   Oral  SpO2: 94% 95% 96%   Weight:      Height:        Intake/Output Summary (Last 24 hours) at 01/25/2020 0751 Last data filed at 01/25/2020 0600 Gross per 24 hour  Intake 1110.85 ml  Output 800 ml  Net 310.85 ml   Last 3 Weights 01/23/2020 01/22/2020 09/27/2019  Weight (lbs) 208 lb 12.4 oz 200 lb 198 lb 4 oz  Weight (kg) 94.7 kg 90.719 kg 89.926 kg      Telemetry    Sinus rhythm- Personally Reviewed   Physical Exam  Alert, oriented male in no distress GEN: No acute  distress.   Neck: No JVD Cardiac: RRR, no murmurs, rubs, or gallops.  Respiratory: Clear to auscultation bilaterally. GI: Soft, nontender, non-distended  MS: No edema; No deformity. Neuro:  Nonfocal  Psych: Normal affect   Labs    High Sensitivity Troponin:   Recent Labs  Lab 01/22/20 2221 01/23/20 0122 01/23/20 0415 01/23/20 0849 01/23/20 1200  TROPONINIHS 880* 1,025* 1,056* 1,061* 860*      Chemistry Recent Labs  Lab 01/22/20 1614 01/22/20 1614 01/23/20 0123 01/23/20 0123 01/24/20 0434 01/24/20 0434 01/24/20 1200 01/24/20 1206 01/25/20 0241  NA 128*   < > 133*   < > 136   < > 139 138 136  K 5.5*   < > 4.0   < > 3.9   < > 3.9 3.8 3.7  CL 97*   < > 101  --  103  --   --   --  102  CO2 22   < > 24  --  25  --   --   --  27  GLUCOSE 570*   < > 244*  --  112*  --   --   --  68*  BUN 24*   < > 26*  --  32*  --   --   --  27*  CREATININE 1.03   < > 0.91  --  0.92  --   --   --  0.91  CALCIUM 8.3*   < > 8.2*  --  8.1*  --   --   --  8.2*  PROT 7.2  --   --   --   --   --   --   --   --   ALBUMIN 3.3*  --   --   --   --   --   --   --   --   AST 28  --   --   --   --   --   --   --   --   ALT 38  --   --   --   --   --   --   --   --   ALKPHOS 92  --   --   --   --   --   --   --   --   BILITOT 0.6  --   --   --   --   --   --   --   --   GFRNONAA >60   < > >60  --  >60  --   --   --  >60  GFRAA >60   < > >60  --  >60  --   --   --  >60  ANIONGAP 9   < > 8  --  8  --   --   --  7   < > = values in this interval not displayed.     Hematology Recent Labs  Lab 01/23/20 0123 01/23/20 0706 01/24/20 0434 01/24/20 0434 01/24/20 1200 01/24/20 1206 01/25/20 0241  WBC 9.9  --  9.6  --   --   --  9.7  RBC 4.59  --  4.36  --   --   --  4.50  HGB 13.0   < > 12.5*   < > 13.3 12.9* 12.5*  HCT 40.3   < > 38.7*   < > 39.0 38.0* 39.5  MCV 87.8  --  88.8  --   --   --  87.8  MCH 28.3  --  28.7  --   --   --  27.8  MCHC 32.3  --  32.3  --   --   --  31.6  RDW 12.7  --   13.0  --   --   --  13.1  PLT 179  --  194  --   --   --  202   < > = values in this interval not displayed.    BNP Recent Labs  Lab 01/22/20 1614  BNP 788.0*     DDimer No results for input(s): DDIMER in the last 168 hours.   Radiology    CARDIAC CATHETERIZATION  Result Date: 01/24/2020  Severe 3 vessel CAD  Patent LM  Proximal to mid LAD 60-70% with evidence of hemodynamic significance, DFR = 0.7.  Ostial to mid circumflex diffuse ISR 99%.  Ostial 95% RCA.  LVEDP 33 mmHg, consistent with acute on chronic combined systolic and diastolic HF.  Moderate pulmonary hypertension with mean PAP 40 mmHg. WHO Group II  (PVR=2 Woods units; PCWP mean 30 mmHg)  Cardiac output 4.9 l/min  PA saturation 57% RECOMMENDATION:  Aggressive management CHF. May require inotrope and mechanical support.  Once HF controlled,  consider CABG.  ECHOCARDIOGRAM COMPLETE  Result Date: 01/23/2020    ECHOCARDIOGRAM REPORT   Patient Name:   KEIN CARLBERG [facility] Date of Exam: 01/23/2020 Medical Rec #:  [id number]           Height:       70.0 in Accession #:    [id number]          Weight:       200.0 lb Date of Birth:  Oct 16, 1965           BSA:          2.087 m Patient Age:    54 years            BP:           108/58 mmHg Patient Gender: M                   HR:           72 bpm. [facility] Procedure: 2D Echo Indications:    Dyspnea 786.09 / R06.00  History:        Patient has prior history of Echocardiogram examinations, most                 recent 04/13/2018. CAD and Previous Myocardial Infarction; Risk                 Factors:Hypertension, Diabetes, Dyslipidemia and Former Smoker.                 Dilated Cardiomyopathy.  Sonographer:    Jeryl Columbia RDCS (AE) Referring Phys: [id number] York Ram ARRIEN IMPRESSIONS  1. Left ventricular ejection fraction, by estimation, is 30%. The left ventricle has moderately decreased function. The left ventricle demonstrates global hypokinesis. Left ventricular  diastolic parameters are consistent with Grade III diastolic dysfunction (restrictive). Elevated left atrial pressure.  2. Right ventricular systolic function is normal. The right ventricular size is normal.  3. Left atrial size was moderately dilated.  4. A small pericardial effusion is present. The pericardial effusion is circumferential. Moderate pleural effusion in the left lateral region.  5. The mitral valve is normal in structure. Mild mitral valve regurgitation. No evidence of mitral stenosis.  6. The aortic valve is tricuspid. Aortic valve regurgitation is not visualized. No aortic stenosis is present.  7. The inferior vena cava is normal in size with greater than 50% respiratory variability, suggesting right atrial pressure of 3 mmHg. FINDINGS  Left Ventricle: Left ventricular ejection fraction, by estimation, is 30%. The left ventricle has moderately decreased function. The left ventricle demonstrates global hypokinesis. The left ventricular internal cavity size was normal in size. There is no left ventricular hypertrophy. Left ventricular diastolic parameters are consistent with Grade III diastolic dysfunction (restrictive). Elevated left atrial pressure. Right Ventricle: The right ventricular size is normal. No increase in right ventricular wall thickness. Right ventricular systolic function is normal. Left Atrium: Left atrial size was moderately dilated. Right Atrium: Right atrial size was normal in size. Pericardium: A small pericardial effusion is present. The pericardial effusion is circumferential. Mitral Valve: The mitral valve is normal in structure. Mild mitral valve regurgitation. No evidence of mitral valve stenosis. Tricuspid Valve: The tricuspid valve is normal in structure. Tricuspid valve regurgitation is not demonstrated. No evidence of tricuspid stenosis. Aortic Valve: The aortic valve is tricuspid. Aortic valve regurgitation is not visualized. No aortic stenosis is present. Aortic valve  mean gradient measures 1.5 mmHg. Aortic valve peak gradient measures 2.4  mmHg. Aortic valve area, by VTI measures 2.01 cm. Pulmonic Valve: The pulmonic valve was not well visualized. Pulmonic valve regurgitation is not visualized. No evidence of pulmonic stenosis. Aorta: The aortic root is normal in size and structure. Pulmonary Artery: Indeterminate PASP, inadequate TR jet. Venous: The inferior vena cava is normal in size with greater than 50% respiratory variability, suggesting right atrial pressure of 3 mmHg. IAS/Shunts: No atrial level shunt detected by color flow Doppler. Additional Comments: A pacer wire is visualized. There is a moderate pleural effusion in the left lateral region.  LEFT VENTRICLE PLAX 2D LVIDd:         5.64 cm  Diastology LVIDs:         4.98 cm  LV e' medial:    4.40 cm/s LV PW:         1.20 cm  LV E/e' medial:  16.8 LV IVS:        1.08 cm  LV e' lateral:   8.45 cm/s LVOT diam:     2.10 cm  LV E/e' lateral: 8.8 LV SV:         28 LV SV Index:   14 LVOT Area:     3.46 cm  RIGHT VENTRICLE RV S prime:     8.27 cm/s TAPSE (M-mode): 1.7 cm LEFT ATRIUM             Index       RIGHT ATRIUM           Index LA diam:        3.50 cm 1.68 cm/m  RA Area:     14.30 cm LA Vol (A2C):   64.7 ml 31.00 ml/m RA Volume:   38.10 ml  18.25 ml/m LA Vol (A4C):   80.1 ml 38.37 ml/m LA Biplane Vol: 76.8 ml 36.79 ml/m  AORTIC VALVE AV Area (Vmax):    2.18 cm AV Area (Vmean):   1.99 cm AV Area (VTI):     2.01 cm AV Vmax:           78.18 cm/s AV Vmean:          59.054 cm/s AV VTI:            0.141 m AV Peak Grad:      2.4 mmHg AV Mean Grad:      1.5 mmHg LVOT Vmax:         49.10 cm/s LVOT Vmean:        34.000 cm/s LVOT VTI:          0.082 m LVOT/AV VTI ratio: 0.58  AORTA Ao Root diam: 2.90 cm MITRAL VALVE MV Area (PHT): 4.17 cm    SHUNTS MV Decel Time: 182 msec    Systemic VTI:  0.08 m MR Peak grad: 59.3 mmHg    Systemic Diam: 2.10 cm MR Mean grad: 43.0 mmHg MR Vmax:      385.00 cm/s MR Vmean:     313.0 cm/s  MV E velocity: 74.10 cm/s MV A velocity: 20.60 cm/s MV E/A ratio:  3.60 Dina Rich MD Electronically signed by Dina Rich MD Signature Date/Time: 01/23/2020/10:49:30 AM    Final    [place] EKG SITE RITE  Result Date: 01/23/2020 If Site Rite image not attached, placement could not be confirmed due to current cardiac rhythm.   Cardiac Studies   2d Echo: IMPRESSIONS    1. Left ventricular ejection fraction, by estimation, is 30%. The left  ventricle has moderately decreased function. The left  ventricle  demonstrates global hypokinesis. Left ventricular diastolic parameters are  consistent with Grade III diastolic  dysfunction (restrictive). Elevated left atrial pressure.  2. Right ventricular systolic function is normal. The right ventricular  size is normal.  3. Left atrial size was moderately dilated.  4. A small pericardial effusion is present. The pericardial effusion is  circumferential. Moderate pleural effusion in the left lateral region.  5. The mitral valve is normal in structure. Mild mitral valve  regurgitation. No evidence of mitral stenosis.  6. The aortic valve is tricuspid. Aortic valve regurgitation is not  visualized. No aortic stenosis is present.  7. The inferior vena cava is normal in size with greater than 50%  respiratory variability, suggesting right atrial pressure of 3 mmHg.   Cardiac Cath: Conclusion   Severe 3 vessel CAD  Patent LM  Proximal to mid LAD 60-70% with evidence of hemodynamic significance, DFR = 0.7.  Ostial to mid circumflex diffuse ISR 99%.  Ostial 95% RCA.  LVEDP 33 mmHg, consistent with acute on chronic combined systolic and diastolic HF.  Moderate pulmonary hypertension with mean PAP 40 mmHg. WHO Group II  (PVR=2 Woods units; PCWP mean 30 mmHg)  Cardiac output 4.9 l/min  PA saturation 57%  RECOMMENDATION:   Aggressive management CHF. May require inotrope and mechanical support.  Once HF controlled, consider  CABG.  Patient Profile     54 y.o. male with medication noncompliance, uncontrolled diabetes hemoglobin A1c 14 mg/dL, severe multivessel coronary artery disease, and acute on chronic systolic heart failure  Assessment & Plan    1. Acute on chronic systolic heart failure with pulmonary edema: Improving with IV furosemide. Blood pressure is too soft to add beta-blocker, ACE/ARB/ARNI, or Aldactone at this time. Continue IV diuresis and slowly add in guideline directed therapies as tolerated. I do not think he requires inotropic therapy at present as his renal function remains normal and he is clinically improving. We will repeat a chest x-ray this morning. 2. Non-STEMI: Troponin peak at about 1000. Cardiac catheterization films reviewed. Severe stenosis of the ostial RCA and proximal LAD. Subtotal occlusion of the left circumflex probably with nongraftable circumflex branches. He does have a good LAD target and his DFR of the LAD demonstrated severe ischemia in that vessel. Suspect his PDA is graftable as well. Will obtain CABG consult in this diabetic patient with severe LV dysfunction and multivessel coronary disease. Continue IV heparin for now. It looks like he has received 3 doses of clopidogrel in the hospital. He had not been taking it at home. It has been discontinued, but I will check a P2 Y 12 assay to assess platelet inhibition. 3. Uncontrolled diabetes: CBG values reviewed and well controlled now on sliding scale insulin. Very poor control at home with hemoglobin A1c 14 4. Hyperlipidemia: Continue high intensity statin drug. Add Praluent back as outpatient.  For questions or updates, please contact [facility] Please consult [email or web address] for contact info under        Signed, Tonny Bollman, MD  01/25/2020, 7:51 AM

## 2020-01-25 NOTE — Progress Notes (Signed)
ANTICOAGULATION CONSULT NOTE - Follow Up Consult  Pharmacy Consult for heparin Indication: CAD awaiting possible intervention  Labs: Recent Labs     0000 01/22/20 1614 01/22/20 1638 01/23/20 0122 01/23/20 0123 01/23/20 0415 01/23/20 0706 01/23/20 0849 01/23/20 1200 01/23/20 1901 01/24/20 0434 01/24/20 0434 01/24/20 0527 01/24/20 1200 01/24/20 1200 01/24/20 1206 01/25/20 0241  HGB   < > 14.7   < >  --  13.0  --    < >  --   --   --  12.5*   < >  --  13.3   < > 12.9* 12.5*  HCT   < > 45.5   < >  --  40.3  --    < >  --   --   --  38.7*   < >  --  39.0  --  38.0* 39.5  PLT  --  207   < >  --  179  --   --   --   --   --  194  --   --   --   --   --  202  HEPARINUNFRC  --   --    < >   < >  --   --   --  0.24*  --    < > 1.92*  --  0.32  --   --   --  0.13*  CREATININE  --  1.03  --   --  0.91  --   --   --   --   --  0.92  --   --   --   --   --   --   TROPONINIHS  --   --    < >   < >  --  1,056*  --  1,061* 860*  --   --   --   --   --   --   --   --    < > = values in this interval not displayed.    Assessment: 54yo male subtherapeutic on heparin after heparin resumed post-cath; no gtt issues or signs of bleeding per RN; may need more time to accumulate though was previously therapeutic at this rate though was at low end of goal and trending down.  Goal of Therapy:  Heparin level 0.3-0.7 units/ml   Plan:  Will increase heparin gtt by 1 unit/kg/hr to 1600 units/hr and check level in 6 hours.    Vernard Gambles, PharmD, BCPS  01/25/2020,3:23 AM

## 2020-01-25 NOTE — Progress Notes (Signed)
CARDIAC REHAB PHASE I   PRE:  Rate/Rhythm: 88 SR    BP: sitting 122/81    SaO2: 98 2L, 95 RA  MODE:  Ambulation: 170 ft   POST:  Rate/Rhythm: 94 SR    BP: sitting 126/78     SaO2: 98 2L  Pt hadn't been out of bed yet. Slight dizziness on EOB. Able to stand and walk with RW, assist x1 with gait belt. 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 Pt fairly steady initially but became more SOB, fatigued, and unsteady with distance. Rest x1. To recliner after walk, quite exhausted. Denied CP but did have SOB that decreased with time/rest. VSS. Left in recliner, wife in room. Pt practiced IS, 1000 mL.  [id number]   Harriet Masson CES, ACSM 01/25/2020 2:15 PM

## 2020-01-25 NOTE — Consult Note (Addendum)
[address]       [place] [address]             [phone]        Alter M Gambrell [place] Medical Record #[id number] Date of Birth: 11-22-1965  Referring: Dr. Burt Knack Primary Care: [name], Fransisca Kaufmann, MD Primary Cardiologist:Jonathan Gwenlyn Found, MD  Chief Complaint:    Chief Complaint  Patient presents with  . Shortness of Breath    History of Present Illness:      Mr. Hellard is a 54 yo male with known history of poorly controlled DM, Hyperlipidemia, Anxiety and Depression, R CEA,  CHF, and longstanding history of CAD.  His CAD dates back to the patient suffering his first MI when he was 54 years old which was treated medically.  He had a catheterization in 2010 which didn't show any significant findings.  He presented with a STEMI on 05/07/2016 which resulted in DES to the OM.  He presented on 05/24/2016 with a cardiac arrest.  Emergent catheterization showed a 90% stenosis of of his OM and stenosis of the previous stented vessel. His hospital course was long and complicated.  The patient required [facility] protocol.  Attempts to wake patient up resulted in his suffering seizures.  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  He suffered residual effects with speech, mobility, and strength.  He required a life vest at discharge and was discharged to a SNF facility.  He was admitted there for 3-4 weeks and regained significant improvement in his symptoms.  Due to his low EF he was brought back to the hospital for ICD placement by Dr. Curt Bears.  This has not fired since placement and at his last device check on 07/12/2019 the device was functioning normally.  The patient presented to the ED on 01/22/2020 with a 4 day complaint of feeling poorly.  He was experiencing chest pressure when lying flat and he has gradual worsening shortness of breath.  He has been experiencing a cough with production of yellow sputum.   He states that  his glucose meter had also been giving him "high" readings.  He admitted to not taking medications for several months, but took doses on day of presentation.  He got a COVID test at [facility] which was negative.  He has been vaccinated.  Workup in the ED showed the patient's blood sugar to be 570.  CXR obtained shows a left sided pleural effusion.   Troponin level was elevated.  He was ruled in for NSTEMI and was admitted for further care and further cardiac workup.  Upon admission he was treated for CHF with aggressive diuretics.  He was started on insulin protocol for better glucose control.  He was placed on a Heparin drip.  He was placed on a Levophed drip for Hypotension.  Cardiology consult was obtained who recommended transfer to [facility] for further care. Upon arrival to [facility] the patient remained in stable condition.  He was taken for cardiac catheterization which was performed on 01/23/2021.  This showed severe 3 vessel CAD.  It was felt the patient would require aggressive heart failure management.  Once optimized medically it was felt coronary bypass grafting would be indicated with a possibility of inotropic and mechanical support.  Cardiothoracic surgery consultation was requested.  Currently the patient is up in bed eating lunch.  His wife is present at bedside and helped  provide historical events.  The patient currently denies chest pain, shortness of breath.  They state he is pretty functional at home.  [place] and he tries to get out and do some things.  He does have continued weakness from his previously suffered stroke and at times experiences aphasia which is frustrating to him.  His diabetes is poorly controlled.  He is not a smoker.  He states his grandfather experienced heart problems in his 75s.  He is agreeable to proceed with surgery.   Current Activity/ Functional Status: Patient is independent with mobility/ambulation, transfers, ADL's, IADL's.   Zubrod  Score: At the time of surgery this patient's most appropriate activity status/level should be described as: []    0    Normal activity, no symptoms []    1    Restricted in physical strenuous activity but ambulatory, able to do out light work [x]    2    Ambulatory and capable of self care, unable to do work activities, up and about                 more than 50%  Of the time                            []    3    Only limited self care, in bed greater than 50% of waking hours []    4    Completely disabled, no self care, confined to bed or chair []    5    Moribund  Past Medical History:  Diagnosis Date  . Acute ST elevation myocardial infarction (STEMI) involving left circumflex coronary artery ([place]) 05/07/2016   PCI to Cx-OM  . Anginal pain ([place])    secondary to sm. vessel disease  . Anxiety   . Arthritis    BIL KNEE PAIN AND BIL ANKLE PAIN  . Bone spur of ankle   . Cardiac arrest ([place]) 05/24/2016   with v fib  . 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 Coronary artery disease involving native coronary artery of native heart with angina pectoris ([place]) 05/24/2016   Remote MI at 54 years of age, Last cath 2010-diffuse non-obstructive disease; last echo 06/16/08 -normal LV function, moderate concentric hypertrophy; nuc 08/2008 no ischemia;  medical therapy; STEMI May 07 2016 - PCI to Cx-OM  . Diabetes mellitus    ON ORAL MEDICATION AND INSULIN  . Dilated cardiomyopathy ([place]) 05/24/2016   EF 325-30% by Echo post STEMI (previously 30-35%)   . Hyperlipidemia   . Hypertension   . Myocardial infarction ([place]) 1996   Post MI  . Peripheral vascular disease ([place])    HAS LEFT CAROTID ARTERY STENOSIS   AND IS S/P RIGHT CAROTID ENDARTERECTOMY 2010 Last carotid dopplers 01/08/2012 wth patent endarterectomy site  . Status post coronary artery stent placement     Past Surgical History:  Procedure Laterality Date  . APPENDECTOMY    . CARDIAC  CATHETERIZATION     FEB 2010, significant branch vessel disease wth diag, marginal, PDA & PLA, nml. LV function  . CARDIAC CATHETERIZATION N/A 05/07/2016   Procedure: Left Heart Cath and Coronary Angiography;  Surgeon: Peter M Martinique, MD;  Location: [facility];  Service: Cardiovascular;  Laterality: N/A;  . CARDIAC CATHETERIZATION N/A 05/07/2016   Procedure: Coronary Stent Intervention;  Surgeon: Peter M Martinique, MD;  Location: [place];  Service: Cardiovascular;  Laterality: N/A;  . CARDIAC CATHETERIZATION N/A 05/24/2016   Procedure: Left Heart Cath and Coronary Angiography;  Surgeon: [name], MD;  Location: [place] [facility];  Service: Cardiovascular;  Laterality: N/A;  . CARDIAC CATHETERIZATION N/A 05/24/2016   Procedure: Coronary Stent Intervention;  Surgeon: [name], MD;  Location: [facility];  Service: Cardiovascular;  Laterality: N/A;  2.5x20 Promus to Ostial/proximal circumflex  . CAROTID ENDARTERECTOMY  09/2008   Rt CEA  . ICD IMPLANT N/A 06/30/2016   Procedure: ICD Implant;  Surgeon: Will Meredith Leeds, MD;  Location: [facility];  Service: Cardiovascular;  Laterality: N/A;  . INTRAVASCULAR PRESSURE WIRE/FFR STUDY N/A 01/24/2020   Procedure: INTRAVASCULAR PRESSURE WIRE/FFR STUDY;  Surgeon: Belva Crome, MD;  Location: [facility];  Service: Cardiovascular;  Laterality: N/A;  . LUNG BIOPSY  2013   Bx's suggest granulomatous dz.  Marland Kitchen RIGHT/LEFT HEART CATH AND CORONARY ANGIOGRAPHY N/A 01/24/2020   Procedure: RIGHT/LEFT HEART CATH AND CORONARY ANGIOGRAPHY;  Surgeon: Belva Crome, MD;  Location: [facility];  Service: Cardiovascular;  Laterality: N/A;  . RT ANKLE   2013    Social History   Tobacco Use  Smoking Status Former Smoker  . Packs/day: 2.00  . Years: 22.00  . Pack years: 44.00  . Types: Cigarettes  . Quit date: 10/23/2011  . Years since quitting: 8.2  Smokeless Tobacco Current User  . Types: Snuff    Social  History   Substance and Sexual Activity  Alcohol Use No     No Known Allergies  Current Facility-Administered Medications  Medication Dose Route Frequency Provider Last Rate Last Admin  . 0.9 %  sodium chloride infusion  250 mL Intravenous PRN Belva Crome, MD   Stopped at 01/24/20 1335  . 0.9 %  sodium chloride infusion  250 mL Intravenous PRN Belva Crome, MD      . acetaminophen (TYLENOL) tablet 650 mg  650 mg Oral Q4H PRN Belva Crome, MD      . aspirin chewable tablet 81 mg  81 mg Oral Daily Belva Crome, MD   81 mg at 01/25/20 0931  . atorvastatin (LIPITOR) tablet 80 mg  80 mg Oral Daily Belva Crome, MD   80 mg at 01/25/20 0931  . Chlorhexidine Gluconate Cloth 2 % PADS 6 each  6 each Topical Daily Belva Crome, MD   6 each at 01/25/20 0932  . Chlorhexidine Gluconate Cloth 2 % PADS 6 each  6 each Topical Q0600 Belva Crome, MD      . diazepam (VALIUM) tablet 5 mg  5 mg Oral Q8H PRN Sherren Mocha, MD      . DULoxetine (CYMBALTA) DR capsule 60 mg  60 mg Oral Daily Belva Crome, MD   60 mg at 01/25/20 0931  . furosemide (LASIX) injection 40 mg  40 mg Intravenous Janalee Dane, MD   40 mg at 01/25/20 9528  . heparin ADULT infusion 100 units/mL (25000 units/215m sodium chloride 0.45%)  1,600 Units/hr Intravenous Continuous BLaren Everts RPH 16 mL/hr at 01/25/20 1200 1,600 Units/hr at 01/25/20 1200  . insulin aspart (novoLOG) injection 0-9 Units  0-9 Units Subcutaneous TID WC GSamuella Cota MD   2 Units at 01/25/20 1127  . mirtazapine (REMERON) tablet 7.5 mg  7.5 mg Oral QHS SBelva Crome MD   7.5 mg at 01/24/20  2237  . morphine 2 MG/ML injection 1 mg  1 mg Intravenous Q2H PRN Belva Crome, MD   1 mg at 01/23/20 0524  . mupirocin ointment (BACTROBAN) 2 % 1 application  1 application Nasal BID Belva Crome, MD   1 application at 01/74/94 [phone]  . nitroGLYCERIN (NITROSTAT) SL tablet 0.4 mg  0.4 mg Sublingual Q5 min PRN Belva Crome, MD      .  ondansetron [facility]) injection 4 mg  4 mg Intravenous Q6H PRN Belva Crome, MD      . oxyCODONE (Oxy IR/ROXICODONE) immediate release tablet 5-10 mg  5-10 mg Oral Q4H PRN Belva Crome, MD      . potassium chloride (KLOR-CON) CR tablet 10 mEq  10 mEq Oral BID Sherren Mocha, MD   10 mEq at 01/25/20 1026  . primidone (MYSOLINE) tablet 50 mg  50 mg Oral QHS Belva Crome, MD   50 mg at 01/24/20 2238  . sodium chloride flush (NS) 0.9 % injection 10-40 mL  10-40 mL Intracatheter Q12H Belva Crome, MD   10 mL at 01/25/20 0932  . sodium chloride flush (NS) 0.9 % injection 10-40 mL  10-40 mL Intracatheter PRN Belva Crome, MD      . sodium chloride flush (NS) 0.9 % injection 3 mL  3 mL Intravenous Q12H Belva Crome, MD   3 mL at 01/24/20 2240  . sodium chloride flush (NS) 0.9 % injection 3 mL  3 mL Intravenous PRN Belva Crome, MD      . sodium chloride flush (NS) 0.9 % injection 3 mL  3 mL Intravenous Q12H Belva Crome, MD   3 mL at 01/25/20 0933  . sodium chloride flush (NS) 0.9 % injection 3 mL  3 mL Intravenous PRN Belva Crome, MD      . tamsulosin [facility]) capsule 0.4 mg  0.4 mg Oral QPC supper Belva Crome, MD   0.4 mg at 01/24/20 1722    Medications Prior to Admission  Medication Sig Dispense Refill Last Dose  . aspirin EC 81 MG tablet Take 81 mg by mouth daily.   01/22/2020 at Unknown time  . atorvastatin (LIPITOR) 80 MG tablet Take 1 tablet (80 mg total) by mouth daily. 90 tablet 3 01/22/2020 at Unknown time  . blood glucose meter kit and supplies KIT Inject 1 each into the skin 4 (four) times daily as needed. Dispense based on patient and insurance preference. Use up to four times daily as directed. (FOR ICD-9 250.00, 250.01). 1 each 0   . carvedilol (COREG) 25 MG tablet Take 1 tablet (25 mg total) by mouth 2 (two) times daily. Please make overdue appt with Dr. Curt Bears and keep appt before anymore refills. Final Attempt 180 tablet 3 01/22/2020 at 0800  . clopidogrel (PLAVIX) 75  MG tablet TAKE 1 TABLET BY MOUTH EVERY DAY IN THE EVENING 90 tablet 3 01/21/2020 at 1700  . DULoxetine (CYMBALTA) 60 MG capsule Take 1 capsule (60 mg total) by mouth daily. 90 capsule 3 01/22/2020 at Unknown time  . glucose blood (ONETOUCH VERIO) test strip 1 each by Other route in the morning, at noon, in the evening, and at bedtime. use for testing 120 each 11   . ibuprofen (ADVIL,MOTRIN) 200 MG tablet Take 200 mg by mouth every 6 (six) hours as needed for moderate pain.   Past Month at Unknown time  . insulin degludec (TRESIBA FLEXTOUCH) 200 UNIT/ML FlexTouch Pen  Inject 116 Units into the skin daily. (Patient taking differently: Inject 160 Units into the skin daily. ) 26 mL 5 01/22/2020 at Unknown time  . Insulin Pen Needle (B-D ULTRAFINE III SHORT PEN) 31G X 8 MM MISC USE TO INJECT INSULIN DAILY DX E11.9 100 each 3   . lisinopril (ZESTRIL) 2.5 MG tablet Take 1 tablet (2.5 mg total) by mouth daily. 90 tablet 3 01/22/2020 at Unknown time  . metFORMIN (GLUCOPHAGE) 500 MG tablet Take 1 tablet (500 mg total) by mouth 2 (two) times daily. 180 tablet 3 01/22/2020 at Unknown time  . mirtazapine (REMERON) 7.5 MG tablet TAKE 1 TABLET AT BEDTIME FOR DEPRESSION 90 tablet 3 01/21/2020 at Unknown time  . OneTouch Delica Lancets 35K MISC Test blood sugars three times daily 100 each 11   . potassium chloride SA (KLOR-CON M20) 20 MEQ tablet Take 1 tablet (20 mEq total) by mouth daily. 90 tablet 3 01/22/2020 at Unknown time  . primidone (MYSOLINE) 50 MG tablet TAKE 1 TABLET BY MOUTH EVERYDAY AT BEDTIME (Patient taking differently: Take 50 mg by mouth at bedtime. ) 90 tablet 1 01/21/2020 at Unknown time  . Semaglutide,0.25 or 0.5MG/DOS, (OZEMPIC, 0.25 OR 0.5 MG/DOSE,) 2 MG/1.5ML SOPN Inject 0.5 mg into the skin once a week. (Patient taking differently: Inject 0.5 mg into the skin every 14 (fourteen) days. ) 4 pen 3 01/22/2020 at Unknown time  . tamsulosin (FLOMAX) 0.4 MG CAPS capsule TAKE 1 CAPSULE DAILY IN THE EVENING 90  capsule 3 01/22/2020 at Unknown time  . topiramate (TOPAMAX) 25 MG tablet Take 1 tablet (25 mg total) by mouth at bedtime as needed. 90 tablet 3 01/21/2020 at Unknown time  . torsemide (DEMADEX) 20 MG tablet Take 1 tablet (20 mg total) by mouth daily. (Needs to be seen 90 tablet 3 01/22/2020 at Unknown time  . Alirocumab (PRALUENT) 150 MG/ML SOAJ Inject 150 mg into the skin every 14 (fourteen) days. (Patient not taking: Reported on 09/27/2019) 2 pen 11   . nitroGLYCERIN (NITROSTAT) 0.4 MG SL tablet PLACE 1 TABLET (0.4 MG TOTAL) UNDER THE TONGUE EVERY 5 (FIVE) MINUTES AS NEEDED FOR CHEST PAIN. 150 tablet 1 PRN    Family History  Problem Relation Age of Onset  . Hypertension Mother   . Diabetes Mother   . Hypertension Maternal Grandfather   . Heart attack Paternal Grandfather   . Heart attack Father 50   Review of Systems:   ROS Constitutional: positive for fatigue, negative for chills and fevers Respiratory: positive for dyspnea on exertion Cardiovascular: positive for chest pressure/discomfort, dyspnea and exertional chest pressure/discomfort Gastrointestinal: negative for nausea and vomiting Musculoskeletal:positive for muscle weakness Neurological: positive for anoxic brain injury, aphasic at times     Cardiac Review of Systems: Y or  [    ]= no  Chest Pain [ Y   ]  Resting SOB [   ] Exertional SOB  [Y ]  Orthopnea [  ]   Pedal Edema [ Y  ]    Palpitations Aqua.Slicker  ] Syncope  [  ]   Presyncope [   ]  General Review of Systems: [Y] = yes [  ]=no Constitional: recent weight change [  ]; anorexia [  ]; fatigue [Y  ]; nausea [ N ]; night sweats [  ]; fever [  ]; or chills Aqua.Slicker  ]  Dental: Last Dentist visit:   Eye : blurred vision [  ]; diplopia [   ]; vision changes [  ];  Amaurosis fugax[  ]; Resp: cough [  ];  wheezing[  ];  hemoptysis[  ]; shortness of breath[ Y ]; paroxysmal nocturnal dyspnea[  ]; dyspnea on exertion[  ]; or  orthopnea[  ];  GI:  gallstones[  ], vomiting[ N ];  dysphagia[  ]; melena[  ];  hematochezia [  ]; heartburn[  ];   Hx of  Colonoscopy[  ]; GU: kidney stones [  ]; hematuria[  ];   dysuria [  ];  nocturia[  ];  history of     obstruction [  ]; urinary frequency [  ]             Skin: rash, swelling[  ];, hair loss[  ];  peripheral edema[Y  ];  or itching[  ]; Musculosketetal: myalgias[  ];  joint swelling[  ];  joint erythema[  ];  joint pain[  ];  back pain[  ];  Heme/Lymph: bruising[  ];  bleeding[  ];  anemia[  ];  Neuro: TIA[  ];  headaches[  ];  stroke[Y  ];  vertigo[  ];  seizures[Y  ];   paresthesias[  ];  difficulty walking[  ];  Psych:depression[  ]; anxiety[  ];  Endocrine: diabetes[ Y, poorly controlled ];  thyroid dysfunction[  ];  Physical Exam: BP 120/89   Pulse 92   Temp 98.1 F (36.7 C) (Oral)   Resp (!) 21   Ht 5' 10" (1.778 m)   Wt 94.7 kg   SpO2 96%   BMI 29.96 kg/m   General appearance: alert, cooperative and no distress Head: Normocephalic, without obvious abnormality, atraumatic Neck: no adenopathy, no carotid bruit, no JVD, supple, symmetrical, trachea midline, thyroid not enlarged, symmetric, no tenderness/mass/nodules and right CEA scar Resp: clear to auscultation bilaterally Cardio: regular rate and rhythm GI: soft, non-tender; bowel sounds normal; no masses,  no organomegaly Extremities: extremities normal, atraumatic, no cyanosis or edema Neurologic: Grossly normal  Diagnostic Studies & Laboratory data:     Recent Radiology Findings:   CARDIAC CATHETERIZATION  Result Date: 01/24/2020  Severe 3 vessel CAD  Patent LM  Proximal to mid LAD 60-70% with evidence of hemodynamic significance, DFR = 0.7.  Ostial to mid circumflex diffuse ISR 99%.  Ostial 95% RCA.  LVEDP 33 mmHg, consistent with acute on chronic combined systolic and diastolic HF.  Moderate pulmonary hypertension with mean PAP 40 mmHg. WHO Group II  (PVR=2 [place] units; PCWP mean 30 mmHg)   Cardiac output 4.9 l/min  PA saturation 57% RECOMMENDATION:  Aggressive management CHF. May require inotrope and mechanical support.  Once HF controlled, consider CABG.  DG Chest Port 1V same Day  Result Date: 01/25/2020 CLINICAL DATA:  Congestive heart failure. EXAM: PORTABLE CHEST 1 VIEW COMPARISON:  01/23/2020 chest radiograph and prior. FINDINGS: Cardiomegaly. Central pulmonary vascular congestion. Patchy left predominant bibasilar opacities. Calcified right base granuloma. No pneumothorax. Small left pleural effusion, decreased from prior exam. Left chest wall pacing device with leads terminating over the right heart. Right upper extremity PICC tip overlying the superior cavoatrial junction. No acute osseous abnormalities. IMPRESSION: Improved pulmonary edema. Cardiomegaly and central pulmonary vascular congestion. Electronically Signed   By: Primitivo Gauze M.D.   On: 01/25/2020 10:30     I have independently reviewed the above radiologic studies and discussed with the patient   Recent Lab Findings:  Lab Results  Component Value Date   WBC 9.7 01/25/2020   HGB 12.5 (L) 01/25/2020   HCT 39.5 01/25/2020   PLT 202 01/25/2020   GLUCOSE 68 (L) 01/25/2020   CHOL 150 09/27/2019   TRIG 261 (H) 09/27/2019   HDL 34 (L) 09/27/2019   LDLCALC 73 09/27/2019   ALT 38 01/22/2020   AST 28 01/22/2020   NA 136 01/25/2020   K 3.7 01/25/2020   CL 102 01/25/2020   CREATININE 0.91 01/25/2020   BUN 27 (H) 01/25/2020   CO2 27 01/25/2020   TSH 3.503 05/07/2016   INR 1.01 05/20/2017   HGBA1C 14.0 (H) 01/22/2020    Assessment / Plan:    1. CAD- extensive history, with previous cardiac arrest, STEMI, artic sun protocol.. resulting in anoxic brain injury, stroke... now presents with NSTEMI, CHF, reduced EF with global hypokinesis of LV... requesting CABG consult 2. Poorly controlled diabetic... A1c is 14, patient has been non-compliant with medications over past several months... good diabetes  control will be imperative, would benefit from diabetes education consult 3. Hyperlipidemia- on statin, praluent as outpatient 4. CHF- may benefit from AHF consult with reduced EF and likely need for mechanical/inotropic support 5. Dispo- patient stable without chest pain, extensive cardiac history including previous arrest with STEMI and artic sun protocol, suffered ABI and stroke at that time with continued residual effects of weakness/aphasic at times... patient will likely be a high risk surgical candidate, who will likely require prolong hospitalization and extensive rehab for deconditioning post operatively.  Some Plavix given in hospital, will review p2Y12 as ordered.    Dr. Orvan Seen will evaluate patient and follow up with formal recommendations  I  spent 60 minutes counseling the patient face to face.   Erin Barrett, PA-C 01/25/2020 12:43 PM   Pt seen and examined; films and studies independently reviewed. He appears an appropriate candidate for Cabg based on age, diabetes status, and low LV EF. He and wife have had the opportunity to ask questions which are answered to their apparent satisfaction. Will plan to proceed on 01/27/20.   Z. Orvan Seen, [place]

## 2020-01-25 NOTE — Progress Notes (Signed)
ANTICOAGULATION CONSULT NOTE - Follow-Up  Pharmacy Consult for Heparin Indication: chest pain/ACS  No Known Allergies  Patient Measurements: Height: 5\' 10"  (177.8 cm) Weight: 94.7 kg (208 lb 12.4 oz) IBW/kg (Calculated) : 73 HEPARIN DW (KG): 92.3  Vital Signs: Temp: 98.1 F (36.7 C) (09/22 1124) Temp Source: Oral (09/22 1124) BP: 121/80 (09/22 1300) Pulse Rate: 87 (09/22 1300)  Labs: Recent Labs     0000 01/23/20 0122 01/23/20 0123 01/23/20 0415 01/23/20 0706 01/23/20 0849 01/23/20 1200 01/23/20 1901 01/24/20 0434 01/24/20 0527 01/24/20 1200 01/24/20 1200 01/24/20 1206 01/25/20 0241 01/25/20 0930 01/25/20 1311  HGB   < >  --  13.0  --    < >  --   --   --  12.5*   < > 13.3   < > 12.9* 12.5*  --   --   HCT   < >  --  40.3  --    < >  --   --   --  38.7*   < > 39.0  --  38.0* 39.5  --   --   PLT  --   --  179  --   --   --   --   --  194  --   --   --   --  202  --   --   HEPARINUNFRC  --    < >  --   --   --  0.24*  --    < > 1.92*   < >  --   --   --  0.13* 1.48* 0.22*  CREATININE  --   --  0.91  --   --   --   --   --  0.92  --   --   --   --  0.91  --   --   TROPONINIHS  --    < >  --  1,056*  --  1,061* 860*  --   --   --   --   --   --   --   --   --    < > = values in this interval not displayed.    Estimated Creatinine Clearance: 108.5 mL/min (by C-G formula based on SCr of 0.91 mg/dL).   Assessment: 62 YOM who presented on 9/19 with SOB and CP/NSTEMI. Transferred to [facility] for cardiac evaluation and cath. Pharmacy consulted for Heparin dosing.   Heparin level came back elevated, but was drawn through PICC line. Repeat level drawn appropriately was subtheraputic 0.22. No bleeding issues noted per patient and RN. CBC is stable.   Goal of Therapy:  Heparin level 0.3-0.7 units/ml Monitor platelets by anticoagulation protocol: Yes   Plan:  - Increase Heparin to 1750 units/hr - Will continue to monitor for any signs/symptoms of bleeding and will follow up  with a confirmatory heparin level in 6 hours. - Monitor daily CBC and HL  Thank you for allowing pharmacy to be a part of this patient's care.  [facility], PharmD PGY1 Pharmacy Resident 01/25/2020 2:01 PM  **Pharmacist phone directory can now be found on [email or web address] ([email or web address]).  Listed under [facility].

## 2020-01-25 NOTE — Progress Notes (Signed)
PROGRESS NOTE    Douglas Carr  ZOX:[id number] DOB: [birth year] DOA: 01/22/2020 PCP: [name], [name], MD    Brief Narrative:  54 year old man PMH including ischemic cardiomyopathy, systolic CHF and diabetes presented with shortness of breath and chest pain. Admitted for acute decompensated systolic CHF, NSTEMI.  Transferred to [facility] under cardiology service for heart catheterization.  Developed hypotension requiring Levophed. TRH managing DM.  Assessment & Plan:   Principal Problem:   NSTEMI (non-ST elevated myocardial infarction) (HCC) Active Problems:   Essential hypertension   Chronic combined systolic and diastolic HF (heart failure), NYHA class 3 (HCC)   Coronary artery disease involving native coronary artery of native heart with angina pectoris (HCC)   Diabetes mellitus with complication (HCC)   Ischemic cardiomyopathy   Anxiety and depression   Hyperlipidemia   Heart failure (HCC)   STEMI (ST elevation myocardial infarction) (HCC)   Elevated troponin  NSTEMI (non-ST elevated myocardial infarction) (HCC) --Status post left heart cath showing severe three-vessel disease, moderate pulmonary artery hypertension. --Management deferred to cardiology. Plans for CABG noted  Diabetes mellitus with complication (HCC) --glucose trends stable thus far with a low of 75 on 9/21 at 130pm. Glucose trends had remained stable since, with most recent glucose of 152 --would continue sliding scale, plan to resume long-acting insulin as blood sugars improve  Acute on chronic systolic failure. --On IV diuresis per Cardiology  DVT prophylaxis: Heparin gtt Code Status: Full Family Communication: Pt in room, family at bedside  Antimicrobials: Anti-infectives (From admission, onward)   None       Subjective: Without chest pain or sob at this time  Objective: Vitals:   01/25/20 1100 01/25/20 1124 01/25/20 1200 01/25/20 1300  BP: 91/78  120/89 121/80  Pulse: 79  92  87  Resp: 10  (!) 21 13  Temp:  98.1 F (36.7 C)    TempSrc:  Oral    SpO2: 94%  96% 94%  Weight:      Height:        Intake/Output Summary (Last 24 hours) at 01/25/2020 1500 Last data filed at 01/25/2020 1300 Gross per 24 hour  Intake 1077.31 ml  Output 1725 ml  Net -647.69 ml   Filed Weights   01/22/20 1621 01/23/20 1800  Weight: 90.7 kg 94.7 kg    Examination:  General exam: Appears calm and comfortable  Respiratory system: Clear to auscultation. Respiratory effort normal. Cardiovascular system: S1 & S2 heard, Regular Gastrointestinal system: Abdomen is nondistended, soft and nontender. No organomegaly or masses felt. Normal bowel sounds heard. Central nervous system: Alert and oriented. No focal neurological deficits. Extremities: Symmetric 5 x 5 power. Skin: No rashes, lesions Psychiatry: Judgement and insight appear normal. Mood & affect appropriate.   Data Reviewed: I have personally reviewed following labs and imaging studies  CBC: Recent Labs  Lab 01/22/20 1614 01/22/20 1614 01/23/20 0123 01/23/20 0123 01/23/20 0706 01/24/20 0434 01/24/20 1200 01/24/20 1206 01/25/20 0241  WBC 11.5*  --  9.9  --   --  9.6  --   --  9.7  NEUTROABS 8.3*  --   --   --   --   --   --   --   --   HGB 14.7   < > 13.0   < > 12.5* 12.5* 13.3 12.9* 12.5*  HCT 45.5   < > 40.3   < > 39.4 38.7* 39.0 38.0* 39.5  MCV 87.8  --  87.8  --   --  88.8  --   --  87.8  PLT 207  --  179  --   --  194  --   --  202   < > = values in this interval not displayed.   Basic Metabolic Panel: Recent Labs  Lab 01/22/20 1614 01/22/20 1614 01/23/20 0123 01/24/20 0434 01/24/20 1200 01/24/20 1206 01/25/20 0241  NA 128*   < > 133* 136 139 138 136  K 5.5*   < > 4.0 3.9 3.9 3.8 3.7  CL 97*  --  101 103  --   --  102  CO2 22  --  24 25  --   --  27  GLUCOSE 570*  --  244* 112*  --   --  68*  BUN 24*  --  26* 32*  --   --  27*  CREATININE 1.03  --  0.91 0.92  --   --  0.91  CALCIUM 8.3*  --   8.2* 8.1*  --   --  8.2*   < > = values in this interval not displayed.   GFR: Estimated Creatinine Clearance: 108.5 mL/min (by C-G formula based on SCr of 0.91 mg/dL). Liver Function Tests: Recent Labs  Lab 01/22/20 1614  AST 28  ALT 38  ALKPHOS 92  BILITOT 0.6  PROT 7.2  ALBUMIN 3.3*   No results for input(s): LIPASE, AMYLASE in the last 168 hours. No results for input(s): AMMONIA in the last 168 hours. Coagulation Profile: No results for input(s): INR, PROTIME in the last 168 hours. Cardiac Enzymes: No results for input(s): CKTOTAL, CKMB, CKMBINDEX, TROPONINI in the last 168 hours. BNP (last 3 results) No results for input(s): PROBNP in the last 8760 hours. HbA1C: Recent Labs    01/22/20 1614  HGBA1C 14.0*   CBG: Recent Labs  Lab 01/24/20 1108 01/24/20 1332 01/24/20 1546 01/25/20 0652 01/25/20 1122  GLUCAP 132* 75 119* 93 152*   Lipid Profile: No results for input(s): CHOL, HDL, LDLCALC, TRIG, CHOLHDL, LDLDIRECT in the last 72 hours. Thyroid Function Tests: No results for input(s): TSH, T4TOTAL, FREET4, T3FREE, THYROIDAB in the last 72 hours. Anemia Panel: No results for input(s): VITAMINB12, FOLATE, FERRITIN, TIBC, IRON, RETICCTPCT in the last 72 hours. Sepsis Labs: No results for input(s): PROCALCITON, LATICACIDVEN in the last 168 hours.  Recent Results (from the past 240 hour(s))  SARS Coronavirus 2 by RT PCR (hospital order, performed in [facility] hospital lab) Nasopharyngeal Nasopharyngeal Swab     Status: None   Collection Time: 01/22/20  4:24 PM   Specimen: Nasopharyngeal Swab  Result Value Ref Range Status   SARS Coronavirus 2 NEGATIVE NEGATIVE Final    Comment: (NOTE) SARS-CoV-2 target nucleic acids are NOT DETECTED.  The SARS-CoV-2 RNA is generally detectable in upper and lower respiratory specimens during the acute phase of infection. The lowest concentration of SARS-CoV-2 viral copies this assay can detect is 250 copies / mL. A negative  result does not preclude SARS-CoV-2 infection and should not be used as the sole basis for treatment or other patient management decisions.  A negative result may occur with improper specimen collection / handling, submission of specimen other than nasopharyngeal swab, presence of viral mutation(s) within the areas targeted by this assay, and inadequate number of viral copies (<250 copies / mL). A negative result must be combined with clinical observations, patient history, and epidemiological information.  Fact Sheet for Patients:   BoilerBrush.com.cy  Fact Sheet for Healthcare Providers: https://pope.com/  This test is not yet approved or  cleared by the [place] and has been authorized for detection and/or diagnosis of SARS-CoV-2 by FDA under an Emergency Use Authorization (EUA).  This EUA will remain in effect (meaning this test can be used) for the duration of the COVID-19 declaration under Section 564(b)(1) of the Act, 21 U.S.C. section 360bbb-3(b)(1), unless the authorization is terminated or revoked sooner.  Performed at [facility], [address] [place], [place] [address]   MRSA PCR Screening     Status: Abnormal   Collection Time: 01/24/20  8:33 AM   Specimen: Nasopharyngeal  Result Value Ref Range Status   MRSA by PCR POSITIVE (A) NEGATIVE Final    Comment:        The GeneXpert MRSA Assay (FDA approved for NASAL specimens only), is one component of a comprehensive MRSA colonization surveillance program. It is not intended to diagnose MRSA infection nor to guide or monitor treatment for MRSA infections. RESULT CALLED TO, READ BACK BY AND VERIFIED WITH: Delice Bison RN 10:05 01/24/20 ([email or web address]) Performed at [facility], [address], [place], [place] [address]      Radiology Studies: CARDIAC CATHETERIZATION  Result Date: 01/24/2020  Severe 3 vessel CAD  Patent LM  Proximal to mid LAD 60-70% with  evidence of hemodynamic significance, DFR = 0.7.  Ostial to mid circumflex diffuse ISR 99%.  Ostial 95% RCA.  LVEDP 33 mmHg, consistent with acute on chronic combined systolic and diastolic HF.  Moderate pulmonary hypertension with mean PAP 40 mmHg. WHO Group II  (PVR=2 [place] units; PCWP mean 30 mmHg)  Cardiac output 4.9 l/min  PA saturation 57% RECOMMENDATION:  Aggressive management CHF. May require inotrope and mechanical support.  Once HF controlled, consider CABG.  DG Chest Port 1V same Day  Result Date: 01/25/2020 CLINICAL DATA:  Congestive heart failure. EXAM: PORTABLE CHEST 1 VIEW COMPARISON:  01/23/2020 chest radiograph and prior. FINDINGS: Cardiomegaly. Central pulmonary vascular congestion. Patchy left predominant bibasilar opacities. Calcified right base granuloma. No pneumothorax. Small left pleural effusion, decreased from prior exam. Left chest wall pacing device with leads terminating over the right heart. Right upper extremity PICC tip overlying the superior cavoatrial junction. No acute osseous abnormalities. IMPRESSION: Improved pulmonary edema. Cardiomegaly and central pulmonary vascular congestion. Electronically Signed   By: Stana Bunting M.D.   On: 01/25/2020 10:30    Scheduled Meds: . aspirin  81 mg Oral Daily  . atorvastatin  80 mg Oral Daily  . Chlorhexidine Gluconate Cloth  6 each Topical Daily  . Chlorhexidine Gluconate Cloth  6 each Topical Q0600  . DULoxetine  60 mg Oral Daily  . furosemide  40 mg Intravenous Q12H  . insulin aspart  0-9 Units Subcutaneous TID WC  . mirtazapine  7.5 mg Oral QHS  . mupirocin ointment  1 application Nasal BID  . potassium chloride  10 mEq Oral BID  . primidone  50 mg Oral QHS  . sodium chloride flush  10-40 mL Intracatheter Q12H  . sodium chloride flush  3 mL Intravenous Q12H  . sodium chloride flush  3 mL Intravenous Q12H  . tamsulosin  0.4 mg Oral QPC supper   Continuous Infusions: . sodium chloride Stopped (01/24/20  1335)  . sodium chloride    . heparin 1,600 Units/hr (01/25/20 1200)     LOS: 3 days   Rickey Barbara, MD [facility] Pager On Amion  If 7PM-7AM, please contact night-coverage 01/25/2020, 3:00 PM

## 2020-01-26 ENCOUNTER — Inpatient Hospital Stay (HOSPITAL_COMMUNITY): Payer: Medicare Other

## 2020-01-26 DIAGNOSIS — Z01810 Encounter for preprocedural cardiovascular examination: Secondary | ICD-10-CM

## 2020-01-26 LAB — GLUCOSE, CAPILLARY
Glucose-Capillary: 126 mg/dL — ABNORMAL HIGH (ref 70–99)
Glucose-Capillary: 165 mg/dL — ABNORMAL HIGH (ref 70–99)
Glucose-Capillary: 158 mg/dL — ABNORMAL HIGH (ref 70–99)
Glucose-Capillary: 182 mg/dL — ABNORMAL HIGH (ref 70–99)

## 2020-01-26 LAB — URINALYSIS, ROUTINE W REFLEX MICROSCOPIC
Bilirubin Urine: NEGATIVE
Glucose, UA: 50 mg/dL — AB
Hgb urine dipstick: NEGATIVE
Ketones, ur: NEGATIVE mg/dL
Leukocytes,Ua: NEGATIVE
Nitrite: NEGATIVE
Protein, ur: NEGATIVE mg/dL
Specific Gravity, Urine: 1.011 (ref 1.005–1.030)
pH: 5 (ref 5.0–8.0)

## 2020-01-26 LAB — PROTIME-INR
INR: 1.1 (ref 0.8–1.2)
Prothrombin Time: 13.6 seconds (ref 11.4–15.2)

## 2020-01-26 LAB — BASIC METABOLIC PANEL
Anion gap: 7 (ref 5–15)
Anion gap: 8 (ref 5–15)
BUN: 22 mg/dL — ABNORMAL HIGH (ref 6–20)
BUN: 21 mg/dL — ABNORMAL HIGH (ref 6–20)
CO2: 28 mmol/L (ref 22–32)
CO2: 29 mmol/L (ref 22–32)
Calcium: 7.9 mg/dL — ABNORMAL LOW (ref 8.9–10.3)
Calcium: 8.5 mg/dL — ABNORMAL LOW (ref 8.9–10.3)
Chloride: 100 mmol/L (ref 98–111)
Chloride: 100 mmol/L (ref 98–111)
Creatinine, Ser: 0.95 mg/dL (ref 0.61–1.24)
Creatinine, Ser: 1.01 mg/dL (ref 0.61–1.24)
GFR calc Af Amer: >60 mL/min (ref >60)
GFR calc Af Amer: >60 mL/min (ref >60)
GFR calc non Af Amer: >60 mL/min (ref >60)
GFR calc non Af Amer: >60 mL/min (ref >60)
Glucose, Bld: 147 mg/dL — ABNORMAL HIGH (ref 70–99)
Glucose, Bld: 154 mg/dL — ABNORMAL HIGH (ref 70–99)
Potassium: 3.7 mmol/L (ref 3.5–5.1)
Potassium: 4.3 mmol/L (ref 3.5–5.1)
Sodium: 135 mmol/L (ref 135–145)
Sodium: 137 mmol/L (ref 135–145)

## 2020-01-26 LAB — PULMONARY FUNCTION TEST
FEF 25-75 Pre: 0.89 L/sec
FEF2575-%Pred-Pre: 26 %
FEV1-%Pred-Pre: 27 %
FEV1-Pre: 1.07 L
FEV1FVC-%Pred-Pre: 98 %
FEV6-%Pred-Pre: 29 %
FEV6-Pre: 1.4 L
FEV6FVC-%Pred-Pre: 103 %
FVC-%Pred-Pre: 28 %
FVC-Pre: 1.4 L
Pre FEV1/FVC ratio: 76 %
Pre FEV6/FVC Ratio: 100 %

## 2020-01-26 LAB — BLOOD GAS, ARTERIAL
Acid-Base Excess: 3.9 mmol/L — ABNORMAL HIGH (ref 0.0–2.0)
Bicarbonate: 28 mmol/L (ref 20.0–28.0)
Drawn by: 331761
FIO2: 21
O2 Saturation: 93.2 %
Patient temperature: 37
pCO2 arterial: 43.6 mmHg (ref 32.0–48.0)
pH, Arterial: 7.424 (ref 7.350–7.450)
pO2, Arterial: 64.1 mmHg — ABNORMAL LOW (ref 83.0–108.0)

## 2020-01-26 LAB — HEPARIN LEVEL (UNFRACTIONATED): Heparin Unfractionated: 0.54 IU/mL (ref 0.30–0.70)

## 2020-01-26 LAB — CBC
HCT: 39.9 % (ref 39.0–52.0)
HCT: 41.8 % (ref 39.0–52.0)
Hemoglobin: 12.6 g/dL — ABNORMAL LOW (ref 13.0–17.0)
Hemoglobin: 13 g/dL (ref 13.0–17.0)
MCH: 28.4 pg (ref 26.0–34.0)
MCH: 28 pg (ref 26.0–34.0)
MCHC: 31.6 g/dL (ref 30.0–36.0)
MCHC: 31.1 g/dL (ref 30.0–36.0)
MCV: 90.1 fL (ref 80.0–100.0)
MCV: 89.9 fL (ref 80.0–100.0)
Platelets: 190 10*3/uL (ref 150–400)
Platelets: 197 10*3/uL (ref 150–400)
RBC: 4.43 MIL/uL (ref 4.22–5.81)
RBC: 4.65 MIL/uL (ref 4.22–5.81)
RDW: 13 % (ref 11.5–15.5)
RDW: 13.1 % (ref 11.5–15.5)
WBC: 7.5 10*3/uL (ref 4.0–10.5)
WBC: 7.3 10*3/uL (ref 4.0–10.5)
nRBC: 0 % (ref 0.0–0.2)
nRBC: 0 % (ref 0.0–0.2)

## 2020-01-26 LAB — COOXEMETRY PANEL
Carboxyhemoglobin: 1.1 % (ref 0.5–1.5)
Methemoglobin: 0.7 % (ref 0.0–1.5)
O2 Saturation: 61.8 %
Total hemoglobin: 12.7 g/dL (ref 12.0–16.0)

## 2020-01-26 LAB — TYPE AND SCREEN
ABO/RH(D): AB POS
Antibody Screen: NEGATIVE

## 2020-01-26 LAB — SURGICAL PCR SCREEN
MRSA, PCR: POSITIVE — AB
Staphylococcus aureus: POSITIVE — AB

## 2020-01-26 LAB — APTT: aPTT: 77 seconds — ABNORMAL HIGH (ref 24–36)

## 2020-01-26 MED ORDER — POTASSIUM CHLORIDE 2 MEQ/ML IV SOLN
80.0000 meq | INTRAVENOUS | Status: DC
  Filled 2020-01-26: qty 40

## 2020-01-26 MED ORDER — VANCOMYCIN HCL 1500 MG/300ML IV SOLN
1500.0000 mg | INTRAVENOUS | Status: AC
  Administered 2020-01-27: 1500 mg via INTRAVENOUS
  Filled 2020-01-26: qty 300

## 2020-01-26 MED ORDER — MAGNESIUM SULFATE 50 % IJ SOLN
40.0000 meq | INTRAMUSCULAR | Status: DC
  Filled 2020-01-26: qty 9.85

## 2020-01-26 MED ORDER — TRANEXAMIC ACID (OHS) PUMP PRIME SOLUTION
2.0000 mg/kg | INTRAVENOUS | Status: DC
  Filled 2020-01-26: qty 1.87

## 2020-01-26 MED ORDER — CHLORHEXIDINE GLUCONATE CLOTH 2 % EX PADS: 6.0000 | MEDICATED_PAD | Freq: Once | CUTANEOUS | Status: DC

## 2020-01-26 MED ORDER — CHLORHEXIDINE GLUCONATE 0.12 % MT SOLN
15.0000 mL | Freq: Once | OROMUCOSAL | Status: AC
  Administered 2020-01-27: 15 mL via OROMUCOSAL
  Filled 2020-01-26: qty 15

## 2020-01-26 MED ORDER — SODIUM CHLORIDE 0.9 % IV SOLN
1.5000 g | INTRAVENOUS | Status: AC
  Administered 2020-01-27: 1.5 g via INTRAVENOUS
  Filled 2020-01-26: qty 1.5

## 2020-01-26 MED ORDER — LOSARTAN POTASSIUM 25 MG PO TABS
25.0000 mg | ORAL_TABLET | Freq: Every day | ORAL | Status: DC
  Administered 2020-01-26: 25 mg via ORAL
  Filled 2020-01-26: qty 1

## 2020-01-26 MED ORDER — INSULIN REGULAR(HUMAN) IN NACL 100-0.9 UT/100ML-% IV SOLN
INTRAVENOUS | Status: AC
  Administered 2020-01-27: 1 [IU]/h via INTRAVENOUS
  Filled 2020-01-26: qty 100

## 2020-01-26 MED ORDER — CHLORHEXIDINE GLUCONATE CLOTH 2 % EX PADS
6.0000 | MEDICATED_PAD | Freq: Once | CUTANEOUS | Status: AC
  Administered 2020-01-26: 6 via TOPICAL

## 2020-01-26 MED ORDER — TRANEXAMIC ACID (OHS) BOLUS VIA INFUSION
15.0000 mg/kg | INTRAVENOUS | Status: AC
  Administered 2020-01-27: 1399.5 mg via INTRAVENOUS
  Filled 2020-01-26: qty 1400

## 2020-01-26 MED ORDER — NITROGLYCERIN IN D5W 200-5 MCG/ML-% IV SOLN
2.0000 ug/min | INTRAVENOUS | Status: AC
  Administered 2020-01-27: 5 ug/min via INTRAVENOUS
  Filled 2020-01-26: qty 250

## 2020-01-26 MED ORDER — MILRINONE LACTATE IN DEXTROSE 20-5 MG/100ML-% IV SOLN
0.3000 ug/kg/min | INTRAVENOUS | Status: AC
  Administered 2020-01-27: .25 ug/kg/min via INTRAVENOUS
  Filled 2020-01-26: qty 100

## 2020-01-26 MED ORDER — TRANEXAMIC ACID 1000 MG/10ML IV SOLN
1.5000 mg/kg/h | INTRAVENOUS | Status: AC
  Administered 2020-01-27: 1.5 mg/kg/h via INTRAVENOUS
  Filled 2020-01-26: qty 25

## 2020-01-26 MED ORDER — TEMAZEPAM 15 MG PO CAPS
15.0000 mg | ORAL_CAPSULE | Freq: Once | ORAL | Status: AC | PRN
  Administered 2020-01-26: 15 mg via ORAL
  Filled 2020-01-26: qty 1

## 2020-01-26 MED ORDER — SODIUM CHLORIDE 0.9 % IV SOLN
750.0000 mg | INTRAVENOUS | Status: AC
  Administered 2020-01-27: 750 mg via INTRAVENOUS
  Filled 2020-01-26: qty 750

## 2020-01-26 MED ORDER — DEXMEDETOMIDINE HCL IN NACL 400 MCG/100ML IV SOLN
0.1000 ug/kg/h | INTRAVENOUS | Status: AC
  Administered 2020-01-27: .5 ug/kg/h via INTRAVENOUS
  Filled 2020-01-26: qty 100

## 2020-01-26 MED ORDER — BISACODYL 5 MG PO TBEC
5.0000 mg | DELAYED_RELEASE_TABLET | Freq: Once | ORAL | Status: AC
  Administered 2020-01-26: 5 mg via ORAL
  Filled 2020-01-26: qty 1

## 2020-01-26 MED ORDER — EPINEPHRINE HCL 5 MG/250ML IV SOLN IN NS
0.0000 ug/min | INTRAVENOUS | Status: AC
  Administered 2020-01-27: 5 ug/min via INTRAVENOUS
  Administered 2020-01-27: 2 ug/min via INTRAVENOUS
  Filled 2020-01-26: qty 250

## 2020-01-26 MED ORDER — PLASMA-LYTE 148 IV SOLN
INTRAVENOUS | Status: DC
  Filled 2020-01-26: qty 2.5

## 2020-01-26 MED ORDER — SODIUM CHLORIDE 0.9 % IV SOLN
INTRAVENOUS | Status: DC
  Filled 2020-01-26: qty 30

## 2020-01-26 MED ORDER — NOREPINEPHRINE 4 MG/250ML-% IV SOLN
0.0000 ug/min | INTRAVENOUS | Status: AC
  Administered 2020-01-27: 2 ug/min via INTRAVENOUS
  Filled 2020-01-26: qty 250

## 2020-01-26 MED ORDER — METOPROLOL TARTRATE 12.5 MG HALF TABLET
12.5000 mg | ORAL_TABLET | Freq: Once | ORAL | Status: AC
  Administered 2020-01-27: 12.5 mg via ORAL
  Filled 2020-01-26: qty 1

## 2020-01-26 MED ORDER — PHENYLEPHRINE HCL-NACL 20-0.9 MG/250ML-% IV SOLN
30.0000 ug/min | INTRAVENOUS | Status: AC
  Administered 2020-01-27: 50 ug/min via INTRAVENOUS
  Filled 2020-01-26: qty 250

## 2020-01-26 MED ORDER — INSULIN ASPART 100 UNIT/ML ~~LOC~~ SOLN
0.0000 [IU] | Freq: Three times a day (TID) | SUBCUTANEOUS | Status: DC
  Administered 2020-01-27: 2 [IU] via SUBCUTANEOUS

## 2020-01-26 NOTE — Progress Notes (Signed)
VASCULAR LAB    Pre CABG Dopplers have been performed.  See CV proc for preliminary results.   Keontay Vora, RVT 01/26/2020, 4:28 PM

## 2020-01-26 NOTE — Progress Notes (Signed)
PROGRESS NOTE    Douglas SermonsClifford M Carr  WUJ:[id number] DOB: 07/02/1965 DOA: 01/22/2020 PCP: [name], Elige RadonJoshua A, MD    Brief Narrative:  54 year old man PMH including ischemic cardiomyopathy, systolic CHF and diabetes presented with shortness of breath and chest pain. Admitted for acute decompensated systolic CHF, NSTEMI.  [facility] for heart catheter[facility].  Developed hypotension requiring Levophed. TRH managing DM.  Assessment & Plan:   Principal Problem:   NSTEMI (non-ST elevated myocardial infarction) (HCC) Active Problems:   Essential hypertension   Chronic combined systolic and diastolic HF (heart failure), NYHA class 3 (HCC)   Coronary artery disease involving native coronary artery of native heart with angina pectoris (HCC)   Diabetes mellitus with complication (HCC)   Ischemic cardiomyopathy   Anxiety and depression   Hyperlipidemia   Heart failure (HCC)   STEMI (ST elevation myocardial infarction) (HCC)   Elevated troponin  NSTEMI (non-ST elevated myocardial infarction) (HCC) --Status post left heart cath showing severe three-vessel disease, moderate pulmonary artery hypertension. --Management deferred to cardiology. Plans for CABG noted  Diabetes mellitus with complication (HCC) --glucose trends thus far stable on SSI coverage alone --appreciate insightful input by Diabetic Coordinator. Pt is historically noncompliant with his insulin regimen --agree with re-dosing insulin while in hospital with plan to gradually titrate up basal insulin once CBG is consistently >150 --Recommendation for future compliance done face to face with family present, especially given heart disease  Acute on chronic systolic failure. --Cont to diurese per Cardiology  DVT prophylaxis: Heparin gtt Code Status: Full Family Communication: Pt in room, family at bedside  Antimicrobials: Anti-infectives (From admission, onward)   Start     Dose/Rate  Route Frequency Ordered Stop   01/27/20 0400  vancomycin (VANCOREADY) IVPB 1500 mg/300 mL        1,500 mg 150 mL/hr over 120 Minutes Intravenous To Surgery 01/26/20 0902 01/28/20 0400   01/27/20 0400  cefUROXime (ZINACEF) 1.5 g in sodium chloride 0.9 % 100 mL IVPB        1.5 g 200 mL/hr over 30 Minutes Intravenous To Surgery 01/26/20 0902 01/28/20 0400   01/27/20 0400  cefUROXime (ZINACEF) 750 mg in sodium chloride 0.9 % 100 mL IVPB        750 mg 200 mL/hr over 30 Minutes Intravenous To Surgery 01/26/20 0902 01/28/20 0400      Subjective: No complaints at this time  Objective: Vitals:   01/26/20 0300 01/26/20 0348 01/26/20 0500 01/26/20 0821  BP:  110/75 98/72 125/72  Pulse: 75 76 73 91  Resp: 11 12 15    Temp:  (!) 97.5 F (36.4 C)    TempSrc:  Oral    SpO2: 98% 98% 96% 97%  Weight:      Height:        Intake/Output Summary (Last 24 hours) at 01/26/2020 1233 Last data filed at 01/26/2020 1224 Gross per 24 hour  Intake 1208.95 ml  Output 1725 ml  Net -516.05 ml   Filed Weights   01/22/20 1621 01/23/20 1800 01/26/20 0245  Weight: 90.7 kg 94.7 kg 93.3 kg    Examination: General exam: Awake, laying in bed, in nad Respiratory system: Normal respiratory effort, no wheezing Cardiovascular system: regular rate, s1, s2 Gastrointestinal system: Soft, nondistended, positive BS Central nervous system: CN2-12 grossly intact, strength intact Extremities: Perfused, no clubbing Skin: Normal skin turgor, no notable skin lesions seen Psychiatry: Mood normal // no visual hallucinations   Data Reviewed: I have personally reviewed following  labs and imaging studies  CBC: Recent Labs  Lab 01/22/20 1614 01/22/20 1614 01/23/20 0123 01/23/20 0706 01/24/20 0434 01/24/20 1200 01/24/20 1206 01/25/20 0241 01/26/20 0310  WBC 11.5*  --  9.9  --  9.6  --   --  9.7 7.5  NEUTROABS 8.3*  --   --   --   --   --   --   --   --   HGB 14.7   < > 13.0   < > 12.5* 13.3 12.9* 12.5* 12.6*    HCT 45.5   < > 40.3   < > 38.7* 39.0 38.0* 39.5 39.9  MCV 87.8  --  87.8  --  88.8  --   --  87.8 90.1  PLT 207  --  179  --  194  --   --  202 190   < > = values in this interval not displayed.   Basic Metabolic Panel: Recent Labs  Lab 01/22/20 1614 01/22/20 1614 01/23/20 0123 01/23/20 0123 01/24/20 0434 01/24/20 1200 01/24/20 1206 01/25/20 0241 01/26/20 0310  NA 128*   < > 133*   < > 136 139 138 136 135  K 5.5*   < > 4.0   < > 3.9 3.9 3.8 3.7 3.7  CL 97*  --  101  --  103  --   --  102 100  CO2 22  --  24  --  25  --   --  27 28  GLUCOSE 570*  --  244*  --  112*  --   --  68* 147*  BUN 24*  --  26*  --  32*  --   --  27* 22*  CREATININE 1.03  --  0.91  --  0.92  --   --  0.91 0.95  CALCIUM 8.3*  --  8.2*  --  8.1*  --   --  8.2* 7.9*   < > = values in this interval not displayed.   GFR: Estimated Creatinine Clearance: 103.2 mL/min (by C-G formula based on SCr of 0.95 mg/dL). Liver Function Tests: Recent Labs  Lab 01/22/20 1614  AST 28  ALT 38  ALKPHOS 92  BILITOT 0.6  PROT 7.2  ALBUMIN 3.3*   No results for input(s): LIPASE, AMYLASE in the last 168 hours. No results for input(s): AMMONIA in the last 168 hours. Coagulation Profile: Recent Labs  Lab 01/26/20 1029  INR 1.1   Cardiac Enzymes: No results for input(s): CKTOTAL, CKMB, CKMBINDEX, TROPONINI in the last 168 hours. BNP (last 3 results) No results for input(s): PROBNP in the last 8760 hours. HbA1C: No results for input(s): HGBA1C in the last 72 hours. CBG: Recent Labs  Lab 01/25/20 0652 01/25/20 1122 01/25/20 1553 01/26/20 0601 01/26/20 1203  GLUCAP 93 152* 112* 126* 165*   Lipid Profile: No results for input(s): CHOL, HDL, LDLCALC, TRIG, CHOLHDL, LDLDIRECT in the last 72 hours. Thyroid Function Tests: No results for input(s): TSH, T4TOTAL, FREET4, T3FREE, THYROIDAB in the last 72 hours. Anemia Panel: No results for input(s): VITAMINB12, FOLATE, FERRITIN, TIBC, IRON, RETICCTPCT in the last  72 hours. Sepsis Labs: No results for input(s): PROCALCITON, LATICACIDVEN in the last 168 hours.  Recent Results (from the past 240 hour(s))  SARS Coronavirus 2 by RT PCR (hospital order, performed in [facility] hospital lab) Nasopharyngeal Nasopharyngeal Swab     Status: None   Collection Time: 01/22/20  4:24 PM   Specimen: Nasopharyngeal Swab  Result Value Ref Range Status   SARS Coronavirus 2 NEGATIVE NEGATIVE Final    Comment: (NOTE) SARS-CoV-2 target nucleic acids are NOT DETECTED.  The SARS-CoV-2 RNA is generally detectable in upper and lower respiratory specimens during the acute phase of infection. The lowest concentration of SARS-CoV-2 viral copies this assay can detect is 250 copies / mL. A negative result does not preclude SARS-CoV-2 infection and should not be used as the sole basis for treatment or other patient management decisions.  A negative result may occur with improper specimen collection / handling, submission of specimen other than nasopharyngeal swab, presence of viral mutation(s) within the areas targeted by this assay, and inadequate number of viral copies (<250 copies / mL). A negative result must be combined with clinical observations, patient history, and epidemiological information.  Fact Sheet for Patients:   BoilerBrush.com.cy  Fact Sheet for Healthcare Providers: https://pope.com/  This test is not yet approved or  cleared by the [place] FDA and has been authorized for detection and/or diagnosis of SARS-CoV-2 by FDA under an Emergency Use Authorization (EUA).  This EUA will remain in effect (meaning this test can be used) for the duration of the COVID-19 declaration under Section 564(b)(1) of the Act, 21 U.S.C. section 360bbb-3(b)(1), unless the authorization is terminated or revoked sooner.  Performed at [facility], [address], [place], [place] [address]   MRSA PCR Screening      Status: Abnormal   Collection Time: 01/24/20  8:33 AM   Specimen: Nasopharyngeal  Result Value Ref Range Status   MRSA by PCR POSITIVE (A) NEGATIVE Final    Comment:        The GeneXpert MRSA Assay (FDA approved for NASAL specimens only), is one component of a comprehensive MRSA colonization surveillance program. It is not intended to diagnose MRSA infection nor to guide or monitor treatment for MRSA infections. RESULT CALLED TO, READ BACK BY AND VERIFIED WITH: Delice Bison RN 10:05 01/24/20 ([email or web address]) Performed at [facility], [address] [place], [place] [address]      Radiology Studies: [facility] Chest [place] 1V same Day  Result Date: 01/25/2020 CLINICAL DATA:  Congestive heart failure. EXAM: PORTABLE CHEST 1 VIEW COMPARISON:  01/23/2020 chest radiograph and prior. FINDINGS: Cardiomegaly. Central pulmonary vascular congestion. Patchy left predominant bibasilar opacities. Calcified right base granuloma. No pneumothorax. Small left pleural effusion, decreased from prior exam. Left chest wall pacing device with leads terminating over the right heart. Right upper extremity PICC tip overlying the superior cavoatrial junction. No acute osseous abnormalities. IMPRESSION: Improved pulmonary edema. Cardiomegaly and central pulmonary vascular congestion. Electronically Signed   By: Stana Bunting M.D.   On: 01/25/2020 10:30    Scheduled Meds: . aspirin  81 mg Oral Daily  . atorvastatin  80 mg Oral Daily  . [START ON 01/27/2020] chlorhexidine  15 mL Mouth/Throat Once  . Chlorhexidine Gluconate Cloth  6 each Topical Daily  . Chlorhexidine Gluconate Cloth  6 each Topical Q0600  . Chlorhexidine Gluconate Cloth  6 each Topical Once   And  . Chlorhexidine Gluconate Cloth  6 each Topical Once  . DULoxetine  60 mg Oral Daily  . [START ON 01/27/2020] epinephrine  0-10 mcg/min Intravenous To OR  . furosemide  40 mg Intravenous Q12H  . [START ON 01/27/2020] heparin-papaverine-plasmalyte irrigation    Irrigation To OR  . insulin aspart  0-9 Units Subcutaneous TID WC  . [START ON 01/27/2020] insulin   Intravenous To OR  . losartan  25 mg Oral Daily  . [START ON 01/27/2020] magnesium sulfate  40 mEq Other To OR  . [START ON 01/27/2020] metoprolol tartrate  12.5 mg Oral Once  . mirtazapine  7.5 mg Oral QHS  . mupirocin ointment  1 application Nasal BID  . [START ON 01/27/2020] phenylephrine  30-200 mcg/min Intravenous To OR  . potassium chloride  10 mEq Oral BID  . [START ON 01/27/2020] potassium chloride  80 mEq Other To OR  . primidone  50 mg Oral QHS  . sodium chloride flush  10-40 mL Intracatheter Q12H  . sodium chloride flush  3 mL Intravenous Q12H  . sodium chloride flush  3 mL Intravenous Q12H  . tamsulosin  0.4 mg Oral QPC supper  . [START ON 01/27/2020] tranexamic acid  15 mg/kg Intravenous To OR  . [START ON 01/27/2020] tranexamic acid  2 mg/kg Intracatheter To OR   Continuous Infusions: . sodium chloride Stopped (01/24/20 1335)  . sodium chloride    . [START ON 01/27/2020] cefUROXime (ZINACEF)  IV    . [START ON 01/27/2020] cefUROXime (ZINACEF)  IV    . [START ON 01/27/2020] dexmedetomidine    . [START ON 01/27/2020] heparin 30,000 units/NS 1000 mL solution for CELLSAVER    . heparin 1,900 Units/hr (01/26/20 1222)  . [START ON 01/27/2020] milrinone    . [START ON 01/27/2020] nitroGLYCERIN    . [START ON 01/27/2020] norepinephrine    . [START ON 01/27/2020] tranexamic acid (CYKLOKAPRON) infusion ([facility])    . [START ON 01/27/2020] vancomycin       LOS: 4 days   Rickey Barbara, MD [facility] Pager On Amion  If 7PM-7AM, please contact night-coverage 01/26/2020, 12:33 PM

## 2020-01-26 NOTE — Progress Notes (Signed)
Progress Note  Patient Name: Douglas Carr Date of Encounter: 01/26/2020  [facility] Cardiologist: Nanetta Batty, MD   Subjective   Breathing improved, some anxiety last night.  No chest pain  Inpatient Medications    Scheduled Meds: . aspirin  81 mg Oral Daily  . atorvastatin  80 mg Oral Daily  . bisacodyl  5 mg Oral Once  . [START ON 01/27/2020] chlorhexidine  15 mL Mouth/Throat Once  . Chlorhexidine Gluconate Cloth  6 each Topical Daily  . Chlorhexidine Gluconate Cloth  6 each Topical Q0600  . Chlorhexidine Gluconate Cloth  6 each Topical Once   And  . Chlorhexidine Gluconate Cloth  6 each Topical Once  . DULoxetine  60 mg Oral Daily  . [START ON 01/27/2020] epinephrine  0-10 mcg/min Intravenous To OR  . furosemide  40 mg Intravenous Q12H  . [START ON 01/27/2020] heparin-papaverine-plasmalyte irrigation   Irrigation To OR  . insulin aspart  0-9 Units Subcutaneous TID WC  . [START ON 01/27/2020] insulin   Intravenous To OR  . [START ON 01/27/2020] magnesium sulfate  40 mEq Other To OR  . [START ON 01/27/2020] metoprolol tartrate  12.5 mg Oral Once  . mirtazapine  7.5 mg Oral QHS  . mupirocin ointment  1 application Nasal BID  . [START ON 01/27/2020] phenylephrine  30-200 mcg/min Intravenous To OR  . potassium chloride  10 mEq Oral BID  . [START ON 01/27/2020] potassium chloride  80 mEq Other To OR  . primidone  50 mg Oral QHS  . sodium chloride flush  10-40 mL Intracatheter Q12H  . sodium chloride flush  3 mL Intravenous Q12H  . sodium chloride flush  3 mL Intravenous Q12H  . tamsulosin  0.4 mg Oral QPC supper  . [START ON 01/27/2020] tranexamic acid  15 mg/kg Intravenous To OR  . [START ON 01/27/2020] tranexamic acid  2 mg/kg Intracatheter To OR   Continuous Infusions: . sodium chloride Stopped (01/24/20 1335)  . sodium chloride    . [START ON 01/27/2020] cefUROXime (ZINACEF)  IV    . [START ON 01/27/2020] cefUROXime (ZINACEF)  IV    . [START ON 01/27/2020]  dexmedetomidine    . [START ON 01/27/2020] heparin 30,000 units/NS 1000 mL solution for CELLSAVER    . heparin 1,900 Units/hr (01/26/20 0319)  . [START ON 01/27/2020] milrinone    . [START ON 01/27/2020] nitroGLYCERIN    . [START ON 01/27/2020] norepinephrine    . [START ON 01/27/2020] tranexamic acid (CYKLOKAPRON) infusion ([facility])    . [START ON 01/27/2020] vancomycin     PRN Meds: sodium chloride, sodium chloride, acetaminophen, diazepam, morphine injection, nitroGLYCERIN, ondansetron (ZOFRAN) IV, oxyCODONE, sodium chloride flush, sodium chloride flush, sodium chloride flush, temazepam   Vital Signs    Vitals:   01/26/20 0300 01/26/20 0348 01/26/20 0500 01/26/20 0821  BP:  110/75 98/72 125/72  Pulse: 75 76 73 91  Resp: 11 12 15    Temp:  (!) 97.5 F (36.4 C)    TempSrc:  Oral    SpO2: 98% 98% 96% 97%  Weight:      Height:        Intake/Output Summary (Last 24 hours) at 01/26/2020 1135 Last data filed at 01/26/2020 1103 Gross per 24 hour  Intake 1248.54 ml  Output 1225 ml  Net 23.54 ml   Last 3 Weights 01/26/2020 01/23/2020 01/22/2020  Weight (lbs) 205 lb 11 oz 208 lb 12.4 oz 200 lb  Weight (kg) 93.3 kg 94.7  kg 90.719 kg      Telemetry    Sinus rhythm- Personally Reviewed   Physical Exam  Alert, oriented, no distress GEN: No acute distress.   Neck: No JVD Cardiac: RRR, no murmurs, rubs, or gallops.  Respiratory:  Inspiratory rales bilaterally. GI: Soft, nontender, non-distended  MS: No edema; No deformity. Neuro:  Nonfocal  Psych: Normal affect   Labs    High Sensitivity Troponin:   Recent Labs  Lab 01/22/20 2221 01/23/20 0122 01/23/20 0415 01/23/20 0849 01/23/20 1200  TROPONINIHS 880* 1,025* 1,056* 1,061* 860*      Chemistry Recent Labs  Lab 01/22/20 1614 01/23/20 0123 01/24/20 0434 01/24/20 1200 01/24/20 1206 01/25/20 0241 01/26/20 0310  NA 128*   < > 136   < > 138 136 135  K 5.5*   < > 3.9   < > 3.8 3.7 3.7  CL 97*   < > 103  --   --  102 100    CO2 22   < > 25  --   --  27 28  GLUCOSE 570*   < > 112*  --   --  68* 147*  BUN 24*   < > 32*  --   --  27* 22*  CREATININE 1.03   < > 0.92  --   --  0.91 0.95  CALCIUM 8.3*   < > 8.1*  --   --  8.2* 7.9*  PROT 7.2  --   --   --   --   --   --   ALBUMIN 3.3*  --   --   --   --   --   --   AST 28  --   --   --   --   --   --   ALT 38  --   --   --   --   --   --   ALKPHOS 92  --   --   --   --   --   --   BILITOT 0.6  --   --   --   --   --   --   GFRNONAA >60   < > >60  --   --  >60 >60  GFRAA >60   < > >60  --   --  >60 >60  ANIONGAP 9   < > 8  --   --  7 7   < > = values in this interval not displayed.     Hematology Recent Labs  Lab 01/24/20 0434 01/24/20 1200 01/24/20 1206 01/25/20 0241 01/26/20 0310  WBC 9.6  --   --  9.7 7.5  RBC 4.36  --   --  4.50 4.43  HGB 12.5*   < > 12.9* 12.5* 12.6*  HCT 38.7*   < > 38.0* 39.5 39.9  MCV 88.8  --   --  87.8 90.1  MCH 28.7  --   --  27.8 28.4  MCHC 32.3  --   --  31.6 31.6  RDW 13.0  --   --  13.1 13.0  PLT 194  --   --  202 190   < > = values in this interval not displayed.    BNP Recent Labs  Lab 01/22/20 1614  BNP 788.0*     DDimer No results for input(s): DDIMER in the last 168 hours.   Radiology    CARDIAC CATHETERIZATION  Result Date: 01/24/2020  Severe 3 vessel CAD  Patent LM  Proximal to mid LAD 60-70% with evidence of hemodynamic significance, DFR = 0.7.  Ostial to mid circumflex diffuse ISR 99%.  Ostial 95% RCA.  LVEDP 33 mmHg, consistent with acute on chronic combined systolic and diastolic HF.  Moderate pulmonary hypertension with mean PAP 40 mmHg. WHO Group II  (PVR=2 [place] units; PCWP mean 30 mmHg)  Cardiac output 4.9 l/min  PA saturation 57% RECOMMENDATION:  Aggressive management CHF. May require inotrope and mechanical support.  Once HF controlled, consider CABG.  DG Chest Port 1V same Day  Result Date: 01/25/2020 CLINICAL DATA:  Congestive heart failure. EXAM: PORTABLE CHEST 1 VIEW  COMPARISON:  01/23/2020 chest radiograph and prior. FINDINGS: Cardiomegaly. Central pulmonary vascular congestion. Patchy left predominant bibasilar opacities. Calcified right base granuloma. No pneumothorax. Small left pleural effusion, decreased from prior exam. Left chest wall pacing device with leads terminating over the right heart. Right upper extremity PICC tip overlying the superior cavoatrial junction. No acute osseous abnormalities. IMPRESSION: Improved pulmonary edema. Cardiomegaly and central pulmonary vascular congestion. Electronically Signed   By: Stana Bunting M.D.   On: 01/25/2020 10:30     Patient Profile     54 y.o. male with medication noncompliance, uncontrolled diabetes hemoglobin A1c 14 mg/dL, severe multivessel coronary artery disease, and acute on chronic systolic heart failure  Assessment & Plan    1.  Acute on chronic systolic heart failure with pulmonary edema: Patient diuresing well on IV furosemide with clinical improvement.  Yesterday's chest x-ray shows improvement in pulmonary edema pattern.  Blood pressure has stabilized and I think he can tolerate a small amount of afterload reduction.  We will start losartan 25 mg daily.  Will avoid beta blockade as I am concerned that he cannot tolerate the negative inotropic effects acutely. 2.  Non-STEMI: Troponin peak at 1056.  Severe multivessel coronary artery disease.  Appreciate cardiac surgical evaluation.  Dr. Vickey Sages to see today.  Consideration for CABG.  Continue IV heparin, aspirin, and the high intensity statin drug. 3.  Uncontrolled diabetes with hyperglycemia: Blood glucoses very well controlled now during his hospitalization. 4.  Mixed hyperlipidemia: Continue high intensity statin drug.  Was on Praluent as an outpatient, will add this back at discharge.  Disposition: Pending cardiac surgical evaluation for CABG.  Add low-dose losartan today.  Otherwise continue current medical therapy.  Likely switch to oral  furosemide tomorrow pending timing of surgery.  For questions or updates, please contact [facility] Please consult [email or web address] for contact info under        Signed, Tonny Bollman, MD  01/26/2020, 11:35 AM

## 2020-01-26 NOTE — Progress Notes (Signed)
ANTICOAGULATION CONSULT NOTE  Pharmacy Consult for Heparin Indication: chest pain/ACS  No Known Allergies  Patient Measurements: Height: 5\' 10"  (177.8 cm) Weight: 94.7 kg (208 lb 12.4 oz) IBW/kg (Calculated) : 73 HEPARIN DW (KG): 92.3  Vital Signs: Temp: 97.4 F (36.3 C) (09/22 2300) Temp Source: Oral (09/22 2300) BP: 114/75 (09/22 2300) Pulse Rate: 79 (09/22 2300)  Labs: Recent Labs     0000 01/23/20 0122 01/23/20 0123 01/23/20 0415 01/23/20 0706 01/23/20 0849 01/23/20 1200 01/23/20 1901 01/24/20 0434 01/24/20 0527 01/24/20 1200 01/24/20 1200 01/24/20 1206 01/25/20 0241 01/25/20 0241 01/25/20 0930 01/25/20 1311 01/25/20 2254  HGB   < >  --  13.0  --    < >  --   --   --  12.5*   < > 13.3   < > 12.9* 12.5*  --   --   --   --   HCT   < >  --  40.3  --    < >  --   --   --  38.7*   < > 39.0  --  38.0* 39.5  --   --   --   --   PLT  --   --  179  --   --   --   --   --  194  --   --   --   --  202  --   --   --   --   HEPARINUNFRC  --    < >  --   --   --  0.24*  --    < > 1.92*   < >  --   --   --  0.13*   < > 1.48* 0.22* 0.20*  CREATININE  --   --  0.91  --   --   --   --   --  0.92  --   --   --   --  0.91  --   --   --   --   TROPONINIHS  --    < >  --  1,056*  --  1,061* 860*  --   --   --   --   --   --   --   --   --   --   --    < > = values in this interval not displayed.    Estimated Creatinine Clearance: 108.5 mL/min (by C-G formula based on SCr of 0.91 mg/dL).   Assessment: 46 YOM who presented on 9/19 with SOB and CP/NSTEMI. Transferred to [facility] for cardiac evaluation and cath. Pharmacy consulted for Heparin dosing.   Heparin level came back elevated, but was drawn through PICC line. Repeat level drawn appropriately was subtheraputic 0.22. No bleeding issues noted per patient and RN. CBC is stable.   9/23 AM update:  Heparin level sub-therapeutic  No issues per RN  Goal of Therapy:  Heparin level 0.3-0.7 units/ml Monitor platelets by  anticoagulation protocol: Yes   Plan:  - Increase Heparin to 1900 units/hr -Re-check heparin level in 6-8 hours  10/23, PharmD, BCPS Clinical Pharmacist Phone: [phone]

## 2020-01-26 NOTE — Anesthesia Preprocedure Evaluation (Addendum)
Anesthesia Evaluation  Patient identified by MRN, date of birth, ID band Patient awake    Reviewed: Allergy & Precautions, H&P , NPO status , Patient's Chart, lab work & pertinent test results, reviewed documented beta blocker date and time   Airway Mallampati: II  TM Distance: >3 FB Neck ROM: Full    Dental no notable dental hx. (+) Teeth Intact, Dental Advisory Given   Pulmonary neg pulmonary ROS, former smoker,    Pulmonary exam normal breath sounds clear to auscultation       Cardiovascular Exercise Tolerance: Good hypertension, Pt. on medications and Pt. on home beta blockers + angina + CAD, + Past MI, + Peripheral Vascular Disease and +CHF   Rhythm:Regular Rate:Normal     Neuro/Psych Anxiety Depression negative neurological ROS     GI/Hepatic negative GI ROS, Neg liver ROS,   Endo/Other  diabetes, Insulin Dependent, Oral Hypoglycemic Agents  Renal/GU negative Renal ROS  negative genitourinary   Musculoskeletal  (+) Arthritis , Osteoarthritis,    Abdominal   Peds  Hematology  (+) Blood dyscrasia, anemia ,   Anesthesia Other Findings   Reproductive/Obstetrics negative OB ROS                           Anesthesia Physical Anesthesia Plan  ASA: IV  Anesthesia Plan: General   Post-op Pain Management:    Induction: Intravenous  PONV Risk Score and Plan: 2 and Midazolam and Ondansetron  Airway Management Planned: Oral ETT  Additional Equipment: Arterial line, CVP, PA Cath, TEE and Ultrasound Guidance Line Placement  Intra-op Plan:   Post-operative Plan: Post-operative intubation/ventilation  Informed Consent: I have reviewed the patients History and Physical, chart, labs and discussed the procedure including the risks, benefits and alternatives for the proposed anesthesia with the patient or authorized representative who has indicated his/her understanding and acceptance.      Dental advisory given  Plan Discussed with: CRNA  Anesthesia Plan Comments:        Anesthesia Quick Evaluation

## 2020-01-26 NOTE — Progress Notes (Signed)
ANTICOAGULATION CONSULT NOTE - Follow-Up  Pharmacy Consult for Heparin Indication: chest pain/ACS  No Known Allergies  Patient Measurements: Height: 5\' 10"  (177.8 cm) Weight: 93.3 kg (205 lb 11 oz) IBW/kg (Calculated) : 73 HEPARIN DW (KG): 91.9  Vital Signs: Temp: 97.5 F (36.4 C) (09/23 0348) Temp Source: Oral (09/23 0348) BP: 125/72 (09/23 0821) Pulse Rate: 91 (09/23 0821)  Labs: Recent Labs    01/23/20 1200 01/23/20 1901 01/24/20 0434 01/24/20 0527 01/24/20 1206 01/25/20 0241 01/25/20 0930 01/25/20 1311 01/25/20 2254 01/26/20 0310 01/26/20 0748  HGB  --   --  12.5*   < > 12.9* 12.5*  --   --   --  12.6*  --   HCT  --   --  38.7*   < > 38.0* 39.5  --   --   --  39.9  --   PLT  --   --  194  --   --  202  --   --   --  190  --   HEPARINUNFRC  --    < > 1.92*   < >  --  0.13*   < > 0.22* 0.20*  --  0.54  CREATININE  --   --  0.92  --   --  0.91  --   --   --  0.95  --   TROPONINIHS 860*  --   --   --   --   --   --   --   --   --   --    < > = values in this interval not displayed.    Estimated Creatinine Clearance: 103.2 mL/min (by C-G formula based on SCr of 0.95 mg/dL).   Assessment: 71 YOM who presented on 9/19 with SOB and CP/NSTEMI. Transferred to [facility] for cardiac evaluation and cath. Pharmacy consulted for Heparin dosing.   Pt being evaluated for CABG now, heparin continues, currently therapeutic. H/H and pltc wnl.  Goal of Therapy:  Heparin level 0.3-0.7 units/ml Monitor platelets by anticoagulation protocol: Yes   Plan:  -Continue heparin 1900 units/hr -Daily heparin level and CBC -CABG tomorrow   [facility], PharmD, BCPS Clinical Pharmacist [phone] Please check AMION for all [facility] Pharmacy numbers 01/26/2020

## 2020-01-27 ENCOUNTER — Inpatient Hospital Stay (HOSPITAL_COMMUNITY): Payer: Medicare Other | Admitting: Critical Care Medicine

## 2020-01-27 ENCOUNTER — Inpatient Hospital Stay (HOSPITAL_COMMUNITY): Payer: Medicare Other

## 2020-01-27 ENCOUNTER — Inpatient Hospital Stay (HOSPITAL_COMMUNITY)
Admission: EM | Disposition: A | Discharge status: Home Health | Payer: Self-pay | Source: Home / Self Care | Attending: Cardiothoracic Surgery

## 2020-01-27 DIAGNOSIS — Z951 Presence of aortocoronary bypass graft: Secondary | ICD-10-CM

## 2020-01-27 DIAGNOSIS — I2510 Atherosclerotic heart disease of native coronary artery without angina pectoris: Secondary | ICD-10-CM

## 2020-01-27 HISTORY — PX: ENDOVEIN HARVEST OF GREATER SAPHENOUS VEIN: SHX5059

## 2020-01-27 HISTORY — PX: CORONARY ARTERY BYPASS GRAFT: SHX141

## 2020-01-27 HISTORY — PX: RADIAL ARTERY HARVEST: SHX5067

## 2020-01-27 HISTORY — PX: TEE WITHOUT CARDIOVERSION: SHX5443

## 2020-01-27 HISTORY — PX: CLIPPING OF ATRIAL APPENDAGE: SHX5773

## 2020-01-27 LAB — POCT I-STAT 7, (LYTES, BLD GAS, ICA,H+H)
Acid-Base Excess: 4 mmol/L — ABNORMAL HIGH (ref 0.0–2.0)
Acid-Base Excess: 5 mmol/L — ABNORMAL HIGH (ref 0.0–2.0)
Acid-Base Excess: 5 mmol/L — ABNORMAL HIGH (ref 0.0–2.0)
Acid-Base Excess: 5 mmol/L — ABNORMAL HIGH (ref 0.0–2.0)
Acid-Base Excess: 2 mmol/L (ref 0.0–2.0)
Acid-base deficit: 2 mmol/L (ref 0.0–2.0)
Acid-base deficit: 4 mmol/L — ABNORMAL HIGH (ref 0.0–2.0)
Acid-base deficit: 3 mmol/L — ABNORMAL HIGH (ref 0.0–2.0)
Acid-base deficit: 2 mmol/L (ref 0.0–2.0)
Bicarbonate: 29.8 mmol/L — ABNORMAL HIGH (ref 20.0–28.0)
Bicarbonate: 30.3 mmol/L — ABNORMAL HIGH (ref 20.0–28.0)
Bicarbonate: 30.9 mmol/L — ABNORMAL HIGH (ref 20.0–28.0)
Bicarbonate: 30.5 mmol/L — ABNORMAL HIGH (ref 20.0–28.0)
Bicarbonate: 27.6 mmol/L (ref 20.0–28.0)
Bicarbonate: 24 mmol/L (ref 20.0–28.0)
Bicarbonate: 22 mmol/L (ref 20.0–28.0)
Bicarbonate: 23.9 mmol/L (ref 20.0–28.0)
Bicarbonate: 23.8 mmol/L (ref 20.0–28.0)
Calcium, Ion: 1.21 mmol/L (ref 1.15–1.40)
Calcium, Ion: 1.05 mmol/L — ABNORMAL LOW (ref 1.15–1.40)
Calcium, Ion: 1.1 mmol/L — ABNORMAL LOW (ref 1.15–1.40)
Calcium, Ion: 1.09 mmol/L — ABNORMAL LOW (ref 1.15–1.40)
Calcium, Ion: 1.07 mmol/L — ABNORMAL LOW (ref 1.15–1.40)
Calcium, Ion: 1.07 mmol/L — ABNORMAL LOW (ref 1.15–1.40)
Calcium, Ion: 1.11 mmol/L — ABNORMAL LOW (ref 1.15–1.40)
Calcium, Ion: 1.11 mmol/L — ABNORMAL LOW (ref 1.15–1.40)
Calcium, Ion: 1.11 mmol/L — ABNORMAL LOW (ref 1.15–1.40)
HCT: 37 % — ABNORMAL LOW (ref 39.0–52.0)
HCT: 28 % — ABNORMAL LOW (ref 39.0–52.0)
HCT: 28 % — ABNORMAL LOW (ref 39.0–52.0)
HCT: 27 % — ABNORMAL LOW (ref 39.0–52.0)
HCT: 26 % — ABNORMAL LOW (ref 39.0–52.0)
HCT: 31 % — ABNORMAL LOW (ref 39.0–52.0)
HCT: 31 % — ABNORMAL LOW (ref 39.0–52.0)
HCT: 31 % — ABNORMAL LOW (ref 39.0–52.0)
HCT: 31 % — ABNORMAL LOW (ref 39.0–52.0)
Hemoglobin: 12.6 g/dL — ABNORMAL LOW (ref 13.0–17.0)
Hemoglobin: 9.5 g/dL — ABNORMAL LOW (ref 13.0–17.0)
Hemoglobin: 9.5 g/dL — ABNORMAL LOW (ref 13.0–17.0)
Hemoglobin: 9.2 g/dL — ABNORMAL LOW (ref 13.0–17.0)
Hemoglobin: 8.8 g/dL — ABNORMAL LOW (ref 13.0–17.0)
Hemoglobin: 10.5 g/dL — ABNORMAL LOW (ref 13.0–17.0)
Hemoglobin: 10.5 g/dL — ABNORMAL LOW (ref 13.0–17.0)
Hemoglobin: 10.5 g/dL — ABNORMAL LOW (ref 13.0–17.0)
Hemoglobin: 10.5 g/dL — ABNORMAL LOW (ref 13.0–17.0)
O2 Saturation: 100 %
O2 Saturation: 100 %
O2 Saturation: 100 %
O2 Saturation: 100 %
O2 Saturation: 99 %
O2 Saturation: 96 %
O2 Saturation: 97 %
O2 Saturation: 98 %
O2 Saturation: 98 %
Patient temperature: 36.3
Patient temperature: 37.2
Patient temperature: 37
Patient temperature: 37
Potassium: 4.3 mmol/L (ref 3.5–5.1)
Potassium: 4 mmol/L (ref 3.5–5.1)
Potassium: 4.5 mmol/L (ref 3.5–5.1)
Potassium: 4.3 mmol/L (ref 3.5–5.1)
Potassium: 4.3 mmol/L (ref 3.5–5.1)
Potassium: 4.2 mmol/L (ref 3.5–5.1)
Potassium: 4.8 mmol/L (ref 3.5–5.1)
Potassium: 4.9 mmol/L (ref 3.5–5.1)
Potassium: 5 mmol/L (ref 3.5–5.1)
Sodium: 138 mmol/L (ref 135–145)
Sodium: 139 mmol/L (ref 135–145)
Sodium: 140 mmol/L (ref 135–145)
Sodium: 138 mmol/L (ref 135–145)
Sodium: 140 mmol/L (ref 135–145)
Sodium: 140 mmol/L (ref 135–145)
Sodium: 139 mmol/L (ref 135–145)
Sodium: 139 mmol/L (ref 135–145)
Sodium: 139 mmol/L (ref 135–145)
TCO2: 31 mmol/L (ref 22–32)
TCO2: 32 mmol/L (ref 22–32)
TCO2: 32 mmol/L (ref 22–32)
TCO2: 32 mmol/L (ref 22–32)
TCO2: 29 mmol/L (ref 22–32)
TCO2: 25 mmol/L (ref 22–32)
TCO2: 23 mmol/L (ref 22–32)
TCO2: 25 mmol/L (ref 22–32)
TCO2: 25 mmol/L (ref 22–32)
pCO2 arterial: 50 mmHg — ABNORMAL HIGH (ref 32.0–48.0)
pCO2 arterial: 47.5 mmHg (ref 32.0–48.0)
pCO2 arterial: 50.3 mmHg — ABNORMAL HIGH (ref 32.0–48.0)
pCO2 arterial: 46.1 mmHg (ref 32.0–48.0)
pCO2 arterial: 47.5 mmHg (ref 32.0–48.0)
pCO2 arterial: 41.8 mmHg (ref 32.0–48.0)
pCO2 arterial: 42.9 mmHg (ref 32.0–48.0)
pCO2 arterial: 50.2 mmHg — ABNORMAL HIGH (ref 32.0–48.0)
pCO2 arterial: 44.8 mmHg (ref 32.0–48.0)
pH, Arterial: 7.383 (ref 7.350–7.450)
pH, Arterial: 7.397 (ref 7.350–7.450)
pH, Arterial: 7.412 (ref 7.350–7.450)
pH, Arterial: 7.428 (ref 7.350–7.450)
pH, Arterial: 7.372 (ref 7.350–7.450)
pH, Arterial: 7.364 (ref 7.350–7.450)
pH, Arterial: 7.319 — ABNORMAL LOW (ref 7.350–7.450)
pH, Arterial: 7.286 — ABNORMAL LOW (ref 7.350–7.450)
pH, Arterial: 7.333 — ABNORMAL LOW (ref 7.350–7.450)
pO2, Arterial: 265 mmHg — ABNORMAL HIGH (ref 83.0–108.0)
pO2, Arterial: 302 mmHg — ABNORMAL HIGH (ref 83.0–108.0)
pO2, Arterial: 426 mmHg — ABNORMAL HIGH (ref 83.0–108.0)
pO2, Arterial: 376 mmHg — ABNORMAL HIGH (ref 83.0–108.0)
pO2, Arterial: 169 mmHg — ABNORMAL HIGH (ref 83.0–108.0)
pO2, Arterial: 83 mmHg (ref 83.0–108.0)
pO2, Arterial: 103 mmHg (ref 83.0–108.0)
pO2, Arterial: 117 mmHg — ABNORMAL HIGH (ref 83.0–108.0)
pO2, Arterial: 104 mmHg (ref 83.0–108.0)

## 2020-01-27 LAB — POCT I-STAT, CHEM 8
BUN: 20 mg/dL (ref 6–20)
BUN: 12 mg/dL (ref 6–20)
BUN: 18 mg/dL (ref 6–20)
BUN: 19 mg/dL (ref 6–20)
BUN: 17 mg/dL (ref 6–20)
BUN: 18 mg/dL (ref 6–20)
BUN: 17 mg/dL (ref 6–20)
BUN: 19 mg/dL (ref 6–20)
Calcium, Ion: 1.16 mmol/L (ref 1.15–1.40)
Calcium, Ion: 0.89 mmol/L — CL (ref 1.15–1.40)
Calcium, Ion: 1.21 mmol/L (ref 1.15–1.40)
Calcium, Ion: 1.21 mmol/L (ref 1.15–1.40)
Calcium, Ion: 1.04 mmol/L — ABNORMAL LOW (ref 1.15–1.40)
Calcium, Ion: 1.09 mmol/L — ABNORMAL LOW (ref 1.15–1.40)
Calcium, Ion: 1.06 mmol/L — ABNORMAL LOW (ref 1.15–1.40)
Calcium, Ion: 1.1 mmol/L — ABNORMAL LOW (ref 1.15–1.40)
Chloride: 100 mmol/L (ref 98–111)
Chloride: 101 mmol/L (ref 98–111)
Chloride: 101 mmol/L (ref 98–111)
Chloride: 112 mmol/L — ABNORMAL HIGH (ref 98–111)
Chloride: 100 mmol/L (ref 98–111)
Chloride: 98 mmol/L (ref 98–111)
Chloride: 100 mmol/L (ref 98–111)
Chloride: 101 mmol/L (ref 98–111)
Creatinine, Ser: 0.8 mg/dL (ref 0.61–1.24)
Creatinine, Ser: 0.3 mg/dL — ABNORMAL LOW (ref 0.61–1.24)
Creatinine, Ser: 0.7 mg/dL (ref 0.61–1.24)
Creatinine, Ser: 0.7 mg/dL (ref 0.61–1.24)
Creatinine, Ser: 0.7 mg/dL (ref 0.61–1.24)
Creatinine, Ser: 0.8 mg/dL (ref 0.61–1.24)
Creatinine, Ser: 0.9 mg/dL (ref 0.61–1.24)
Creatinine, Ser: 1 mg/dL (ref 0.61–1.24)
Glucose, Bld: 162 mg/dL — ABNORMAL HIGH (ref 70–99)
Glucose, Bld: 100 mg/dL — ABNORMAL HIGH (ref 70–99)
Glucose, Bld: 112 mg/dL — ABNORMAL HIGH (ref 70–99)
Glucose, Bld: 71 mg/dL (ref 70–99)
Glucose, Bld: 89 mg/dL (ref 70–99)
Glucose, Bld: 93 mg/dL (ref 70–99)
Glucose, Bld: 102 mg/dL — ABNORMAL HIGH (ref 70–99)
Glucose, Bld: 117 mg/dL — ABNORMAL HIGH (ref 70–99)
HCT: 36 % — ABNORMAL LOW (ref 39.0–52.0)
HCT: 22 % — ABNORMAL LOW (ref 39.0–52.0)
HCT: 34 % — ABNORMAL LOW (ref 39.0–52.0)
HCT: 34 % — ABNORMAL LOW (ref 39.0–52.0)
HCT: 28 % — ABNORMAL LOW (ref 39.0–52.0)
HCT: 28 % — ABNORMAL LOW (ref 39.0–52.0)
HCT: 25 % — ABNORMAL LOW (ref 39.0–52.0)
HCT: 27 % — ABNORMAL LOW (ref 39.0–52.0)
Hemoglobin: 12.2 g/dL — ABNORMAL LOW (ref 13.0–17.0)
Hemoglobin: 11.6 g/dL — ABNORMAL LOW (ref 13.0–17.0)
Hemoglobin: 11.6 g/dL — ABNORMAL LOW (ref 13.0–17.0)
Hemoglobin: 7.5 g/dL — ABNORMAL LOW (ref 13.0–17.0)
Hemoglobin: 9.5 g/dL — ABNORMAL LOW (ref 13.0–17.0)
Hemoglobin: 9.5 g/dL — ABNORMAL LOW (ref 13.0–17.0)
Hemoglobin: 8.5 g/dL — ABNORMAL LOW (ref 13.0–17.0)
Hemoglobin: 9.2 g/dL — ABNORMAL LOW (ref 13.0–17.0)
Potassium: 4.3 mmol/L (ref 3.5–5.1)
Potassium: 2.7 mmol/L — CL (ref 3.5–5.1)
Potassium: 4.2 mmol/L (ref 3.5–5.1)
Potassium: 4.2 mmol/L (ref 3.5–5.1)
Potassium: 4 mmol/L (ref 3.5–5.1)
Potassium: 4.3 mmol/L (ref 3.5–5.1)
Potassium: 4.2 mmol/L (ref 3.5–5.1)
Potassium: 5 mmol/L (ref 3.5–5.1)
Sodium: 138 mmol/L (ref 135–145)
Sodium: 137 mmol/L (ref 135–145)
Sodium: 138 mmol/L (ref 135–145)
Sodium: 145 mmol/L (ref 135–145)
Sodium: 138 mmol/L (ref 135–145)
Sodium: 140 mmol/L (ref 135–145)
Sodium: 138 mmol/L (ref 135–145)
Sodium: 139 mmol/L (ref 135–145)
TCO2: 30 mmol/L (ref 22–32)
TCO2: 16 mmol/L — ABNORMAL LOW (ref 22–32)
TCO2: 24 mmol/L (ref 22–32)
TCO2: 24 mmol/L (ref 22–32)
TCO2: 27 mmol/L (ref 22–32)
TCO2: 28 mmol/L (ref 22–32)
TCO2: 24 mmol/L (ref 22–32)
TCO2: 28 mmol/L (ref 22–32)

## 2020-01-27 LAB — BASIC METABOLIC PANEL
Anion gap: 7 (ref 5–15)
Anion gap: 9 (ref 5–15)
BUN: 19 mg/dL (ref 6–20)
BUN: 19 mg/dL (ref 6–20)
CO2: 30 mmol/L (ref 22–32)
CO2: 23 mmol/L (ref 22–32)
Calcium: 8.1 mg/dL — ABNORMAL LOW (ref 8.9–10.3)
Calcium: 7.5 mg/dL — ABNORMAL LOW (ref 8.9–10.3)
Chloride: 98 mmol/L (ref 98–111)
Chloride: 107 mmol/L (ref 98–111)
Creatinine, Ser: 0.81 mg/dL (ref 0.61–1.24)
Creatinine, Ser: 1.5 mg/dL — ABNORMAL HIGH (ref 0.61–1.24)
GFR calc Af Amer: >60 mL/min (ref >60)
GFR calc Af Amer: >60 mL/min (ref >60)
GFR calc non Af Amer: >60 mL/min (ref >60)
GFR calc non Af Amer: 52 mL/min — ABNORMAL LOW (ref >60)
Glucose, Bld: 185 mg/dL — ABNORMAL HIGH (ref 70–99)
Glucose, Bld: 184 mg/dL — ABNORMAL HIGH (ref 70–99)
Potassium: 3.9 mmol/L (ref 3.5–5.1)
Potassium: 4.9 mmol/L (ref 3.5–5.1)
Sodium: 135 mmol/L (ref 135–145)
Sodium: 139 mmol/L (ref 135–145)

## 2020-01-27 LAB — CBC
HCT: 37 % — ABNORMAL LOW (ref 39.0–52.0)
HCT: 33.8 % — ABNORMAL LOW (ref 39.0–52.0)
HCT: 32.3 % — ABNORMAL LOW (ref 39.0–52.0)
Hemoglobin: 11.5 g/dL — ABNORMAL LOW (ref 13.0–17.0)
Hemoglobin: 10.5 g/dL — ABNORMAL LOW (ref 13.0–17.0)
Hemoglobin: 10.2 g/dL — ABNORMAL LOW (ref 13.0–17.0)
MCH: 27.9 pg (ref 26.0–34.0)
MCH: 27.8 pg (ref 26.0–34.0)
MCH: 28 pg (ref 26.0–34.0)
MCHC: 31.1 g/dL (ref 30.0–36.0)
MCHC: 31.1 g/dL (ref 30.0–36.0)
MCHC: 31.6 g/dL (ref 30.0–36.0)
MCV: 89.8 fL (ref 80.0–100.0)
MCV: 89.4 fL (ref 80.0–100.0)
MCV: 88.7 fL (ref 80.0–100.0)
Platelets: 188 10*3/uL (ref 150–400)
Platelets: 187 10*3/uL (ref 150–400)
Platelets: 225 10*3/uL (ref 150–400)
RBC: 4.12 MIL/uL — ABNORMAL LOW (ref 4.22–5.81)
RBC: 3.78 MIL/uL — ABNORMAL LOW (ref 4.22–5.81)
RBC: 3.64 MIL/uL — ABNORMAL LOW (ref 4.22–5.81)
RDW: 13 % (ref 11.5–15.5)
RDW: 12.8 % (ref 11.5–15.5)
RDW: 12.9 % (ref 11.5–15.5)
WBC: 6.9 10*3/uL (ref 4.0–10.5)
WBC: 18.6 10*3/uL — ABNORMAL HIGH (ref 4.0–10.5)
WBC: 18.5 10*3/uL — ABNORMAL HIGH (ref 4.0–10.5)
nRBC: 0 % (ref 0.0–0.2)
nRBC: 0 % (ref 0.0–0.2)
nRBC: 0 % (ref 0.0–0.2)

## 2020-01-27 LAB — COOXEMETRY PANEL
Carboxyhemoglobin: 1.6 % — ABNORMAL HIGH (ref 0.5–1.5)
Methemoglobin: 1.1 % (ref 0.0–1.5)
O2 Saturation: 58.5 %
Total hemoglobin: 12.2 g/dL (ref 12.0–16.0)

## 2020-01-27 LAB — GLUCOSE, CAPILLARY
Glucose-Capillary: 177 mg/dL — ABNORMAL HIGH (ref 70–99)
Glucose-Capillary: 116 mg/dL — ABNORMAL HIGH (ref 70–99)
Glucose-Capillary: 132 mg/dL — ABNORMAL HIGH (ref 70–99)
Glucose-Capillary: 139 mg/dL — ABNORMAL HIGH (ref 70–99)
Glucose-Capillary: 159 mg/dL — ABNORMAL HIGH (ref 70–99)
Glucose-Capillary: 178 mg/dL — ABNORMAL HIGH (ref 70–99)
Glucose-Capillary: 185 mg/dL — ABNORMAL HIGH (ref 70–99)
Glucose-Capillary: 198 mg/dL — ABNORMAL HIGH (ref 70–99)

## 2020-01-27 LAB — HEMOGLOBIN AND HEMATOCRIT, BLOOD
HCT: 27.6 % — ABNORMAL LOW (ref 39.0–52.0)
Hemoglobin: 8.9 g/dL — ABNORMAL LOW (ref 13.0–17.0)

## 2020-01-27 LAB — PROTIME-INR
INR: 1.5 — ABNORMAL HIGH (ref 0.8–1.2)
Prothrombin Time: 17.3 seconds — ABNORMAL HIGH (ref 11.4–15.2)

## 2020-01-27 LAB — HEMOGLOBIN A1C
Hgb A1c MFr Bld: 13.8 % — ABNORMAL HIGH (ref 4.8–5.6)
Mean Plasma Glucose: 349.36 mg/dL

## 2020-01-27 LAB — MAGNESIUM: Magnesium: 2.7 mg/dL — ABNORMAL HIGH (ref 1.7–2.4)

## 2020-01-27 LAB — PLATELET COUNT: Platelets: 156 10*3/uL (ref 150–400)

## 2020-01-27 LAB — ECHO INTRAOPERATIVE TEE
Height: 70 in
S' Lateral: 5.9 cm
Weight: 3291.03 oz

## 2020-01-27 LAB — APTT: aPTT: 29 seconds (ref 24–36)

## 2020-01-27 SURGERY — CORONARY ARTERY BYPASS GRAFTING (CABG)
Anesthesia: General | Site: Leg Upper | Laterality: Right

## 2020-01-27 MED ORDER — MIDAZOLAM HCL 5 MG/5ML IJ SOLN
INTRAMUSCULAR | Status: DC | PRN
  Administered 2020-01-27: 2 mg via INTRAVENOUS
  Administered 2020-01-27: 3 mg via INTRAVENOUS
  Administered 2020-01-27: 2 mg via INTRAVENOUS
  Administered 2020-01-27: 1 mg via INTRAVENOUS
  Administered 2020-01-27: 2 mg via INTRAVENOUS

## 2020-01-27 MED ORDER — PLATELET RICH PLASMA OPTIME
Status: DC | PRN
  Administered 2020-01-27: 10 mL

## 2020-01-27 MED ORDER — LACTATED RINGERS IV SOLN: INTRAVENOUS | Status: DC | PRN

## 2020-01-27 MED ORDER — VANCOMYCIN HCL 1000 MG IV SOLR
INTRAVENOUS | Status: AC
  Filled 2020-01-27: qty 3000

## 2020-01-27 MED ORDER — NOREPINEPHRINE 16 MG/250ML-% IV SOLN
0.0000 ug/min | INTRAVENOUS | Status: DC
  Administered 2020-01-27: 10 ug/min via INTRAVENOUS
  Filled 2020-01-27: qty 250

## 2020-01-27 MED ORDER — PHENYLEPHRINE HCL-NACL 20-0.9 MG/250ML-% IV SOLN: 0.0000 ug/min | INTRAVENOUS | Status: DC

## 2020-01-27 MED ORDER — BUPIVACAINE LIPOSOME 1.3 % IJ SUSP
20.0000 mL | Freq: Once | INTRAMUSCULAR | Status: AC
  Administered 2020-01-27: 20 mL
  Filled 2020-01-27: qty 20

## 2020-01-27 MED ORDER — EPINEPHRINE 1 MG/10ML IJ SOSY
PREFILLED_SYRINGE | INTRAMUSCULAR | Status: AC
  Filled 2020-01-27: qty 10

## 2020-01-27 MED ORDER — PROTAMINE SULFATE 10 MG/ML IV SOLN
INTRAVENOUS | Status: AC
  Filled 2020-01-27: qty 5

## 2020-01-27 MED ORDER — FENTANYL CITRATE (PF) 250 MCG/5ML IJ SOLN
INTRAMUSCULAR | Status: AC
  Filled 2020-01-27: qty 25

## 2020-01-27 MED ORDER — FENTANYL CITRATE (PF) 250 MCG/5ML IJ SOLN
INTRAMUSCULAR | Status: DC | PRN
  Administered 2020-01-27: 100 ug via INTRAVENOUS
  Administered 2020-01-27 (×3): 150 ug via INTRAVENOUS
  Administered 2020-01-27: 50 ug via INTRAVENOUS
  Administered 2020-01-27 (×2): 100 ug via INTRAVENOUS
  Administered 2020-01-27: 150 ug via INTRAVENOUS
  Administered 2020-01-27 (×3): 100 ug via INTRAVENOUS

## 2020-01-27 MED ORDER — HEPARIN SODIUM (PORCINE) 1000 UNIT/ML IJ SOLN
INTRAMUSCULAR | Status: AC
  Filled 2020-01-27: qty 1

## 2020-01-27 MED ORDER — NITROGLYCERIN IN D5W 200-5 MCG/ML-% IV SOLN: 0.0000 ug/min | INTRAVENOUS | Status: DC

## 2020-01-27 MED ORDER — PROTAMINE SULFATE 10 MG/ML IV SOLN
INTRAVENOUS | Status: AC
  Filled 2020-01-27: qty 25

## 2020-01-27 MED ORDER — ONDANSETRON HCL 4 MG/2ML IJ SOLN
4.0000 mg | Freq: Four times a day (QID) | INTRAMUSCULAR | Status: DC | PRN
  Administered 2020-01-28: 4 mg via INTRAVENOUS
  Filled 2020-01-27: qty 2

## 2020-01-27 MED ORDER — VASOPRESSIN 20 UNITS/100 ML INFUSION FOR SHOCK
INTRAVENOUS | Status: DC | PRN
  Administered 2020-01-27: .03 [IU]/min via INTRAVENOUS

## 2020-01-27 MED ORDER — VASOPRESSIN 20 UNITS/100 ML INFUSION FOR SHOCK: 0.0000 [IU]/min | INTRAVENOUS | Status: DC

## 2020-01-27 MED ORDER — MILRINONE LACTATE IN DEXTROSE 20-5 MG/100ML-% IV SOLN
0.1250 ug/kg/min | INTRAVENOUS | Status: DC
  Administered 2020-01-28 – 2020-02-01 (×9): 0.25 ug/kg/min via INTRAVENOUS
  Filled 2020-01-27 (×9): qty 100

## 2020-01-27 MED ORDER — DEXTROSE 50 % IV SOLN: 0.0000 mL | INTRAVENOUS | Status: DC | PRN

## 2020-01-27 MED ORDER — SODIUM CHLORIDE 0.9% FLUSH
3.0000 mL | Freq: Two times a day (BID) | INTRAVENOUS | Status: DC
  Administered 2020-01-30: 3 mL via INTRAVENOUS

## 2020-01-27 MED ORDER — MIDAZOLAM HCL (PF) 10 MG/2ML IJ SOLN
INTRAMUSCULAR | Status: AC
  Filled 2020-01-27: qty 2

## 2020-01-27 MED ORDER — ACETAMINOPHEN 160 MG/5ML PO SOLN: 650.0000 mg | Freq: Once | ORAL | Status: AC

## 2020-01-27 MED ORDER — VASOPRESSIN 20 UNIT/ML IV SOLN
INTRAVENOUS | Status: AC
  Filled 2020-01-27: qty 1

## 2020-01-27 MED ORDER — HEPARIN SODIUM (PORCINE) 1000 UNIT/ML IJ SOLN
INTRAMUSCULAR | Status: DC | PRN
  Administered 2020-01-27: 33000 [IU] via INTRAVENOUS

## 2020-01-27 MED ORDER — OXYCODONE HCL 5 MG PO TABS
5.0000 mg | ORAL_TABLET | ORAL | Status: DC | PRN
  Administered 2020-01-27: 5 mg via ORAL
  Administered 2020-01-28 – 2020-02-04 (×11): 10 mg via ORAL
  Filled 2020-01-27 (×2): qty 2
  Filled 2020-01-27: qty 1
  Filled 2020-01-27 (×9): qty 2

## 2020-01-27 MED ORDER — THROMBIN 5000 UNITS EX SOLR
INTRAVENOUS | Status: DC | PRN
  Administered 2020-01-27: 1 mL

## 2020-01-27 MED ORDER — ROCURONIUM BROMIDE 10 MG/ML (PF) SYRINGE
PREFILLED_SYRINGE | INTRAVENOUS | Status: AC
  Filled 2020-01-27: qty 10

## 2020-01-27 MED ORDER — MORPHINE SULFATE (PF) 2 MG/ML IV SOLN
1.0000 mg | INTRAVENOUS | Status: DC | PRN
  Administered 2020-01-27 – 2020-01-28 (×3): 2 mg via INTRAVENOUS
  Administered 2020-01-28: 4 mg via INTRAVENOUS
  Filled 2020-01-27: qty 2
  Filled 2020-01-27: qty 1
  Filled 2020-01-27: qty 2
  Filled 2020-01-27 (×3): qty 1

## 2020-01-27 MED ORDER — BUPIVACAINE HCL (PF) 0.5 % IJ SOLN
INTRAMUSCULAR | Status: DC | PRN
  Administered 2020-01-27: 30 mL

## 2020-01-27 MED ORDER — LACTATED RINGERS IV SOLN: 500.0000 mL | Freq: Once | INTRAVENOUS | Status: DC | PRN

## 2020-01-27 MED ORDER — LACTATED RINGERS IV SOLN: INTRAVENOUS | Status: DC

## 2020-01-27 MED ORDER — TRAMADOL HCL 50 MG PO TABS
50.0000 mg | ORAL_TABLET | ORAL | Status: DC | PRN
  Administered 2020-01-28 – 2020-02-02 (×6): 100 mg via ORAL
  Filled 2020-01-27 (×6): qty 2

## 2020-01-27 MED ORDER — METOPROLOL TARTRATE 25 MG/10 ML ORAL SUSPENSION: 12.5000 mg | Freq: Two times a day (BID) | ORAL | Status: DC

## 2020-01-27 MED ORDER — ALBUMIN HUMAN 5 % IV SOLN: INTRAVENOUS | Status: DC | PRN

## 2020-01-27 MED ORDER — METOPROLOL TARTRATE 12.5 MG HALF TABLET
12.5000 mg | ORAL_TABLET | Freq: Two times a day (BID) | ORAL | Status: DC
  Administered 2020-01-27: 12.5 mg via ORAL
  Filled 2020-01-27 (×2): qty 1

## 2020-01-27 MED ORDER — SODIUM CHLORIDE 0.9 % IV SOLN: INTRAVENOUS | Status: DC

## 2020-01-27 MED ORDER — ROCURONIUM BROMIDE 10 MG/ML (PF) SYRINGE
PREFILLED_SYRINGE | INTRAVENOUS | Status: AC
  Filled 2020-01-27: qty 20

## 2020-01-27 MED ORDER — DEXMEDETOMIDINE HCL IN NACL 400 MCG/100ML IV SOLN: 0.0000 ug/kg/h | INTRAVENOUS | Status: DC

## 2020-01-27 MED ORDER — STERILE WATER FOR INJECTION IJ SOLN
INTRAMUSCULAR | Status: AC
  Filled 2020-01-27: qty 10

## 2020-01-27 MED ORDER — SODIUM CHLORIDE 0.9 % IV SOLN
1.5000 g | Freq: Two times a day (BID) | INTRAVENOUS | Status: AC
  Administered 2020-01-27 – 2020-01-29 (×4): 1.5 g via INTRAVENOUS
  Filled 2020-01-27 (×4): qty 1.5

## 2020-01-27 MED ORDER — PLATELET POOR PLASMA OPTIME
Status: DC | PRN
  Administered 2020-01-27: 10 mL

## 2020-01-27 MED ORDER — CHLORHEXIDINE GLUCONATE CLOTH 2 % EX PADS: 6.0000 | MEDICATED_PAD | Freq: Every day | CUTANEOUS | Status: DC

## 2020-01-27 MED ORDER — SODIUM CHLORIDE 0.9% FLUSH: 3.0000 mL | INTRAVENOUS | Status: DC | PRN

## 2020-01-27 MED ORDER — ASPIRIN EC 325 MG PO TBEC
325.0000 mg | DELAYED_RELEASE_TABLET | Freq: Every day | ORAL | Status: DC
  Administered 2020-01-28 – 2020-01-30 (×3): 325 mg via ORAL
  Filled 2020-01-27 (×3): qty 1

## 2020-01-27 MED ORDER — SODIUM CHLORIDE 0.9 % IV SOLN: INTRAVENOUS | Status: DC | PRN

## 2020-01-27 MED ORDER — PROTAMINE SULFATE 10 MG/ML IV SOLN
INTRAVENOUS | Status: DC | PRN
  Administered 2020-01-27: 330 mg via INTRAVENOUS

## 2020-01-27 MED ORDER — MUPIROCIN 2 % EX OINT
1.0000 "application " | TOPICAL_OINTMENT | Freq: Two times a day (BID) | CUTANEOUS | Status: AC
  Administered 2020-01-27 – 2020-02-01 (×10): 1 via NASAL
  Filled 2020-01-27 (×2): qty 22

## 2020-01-27 MED ORDER — DOCUSATE SODIUM 100 MG PO CAPS
200.0000 mg | ORAL_CAPSULE | Freq: Every day | ORAL | Status: DC
  Administered 2020-01-28 – 2020-02-04 (×8): 200 mg via ORAL
  Filled 2020-01-27 (×8): qty 2

## 2020-01-27 MED ORDER — STERILE WATER FOR INJECTION IJ SOLN
INTRAMUSCULAR | Status: DC | PRN
  Administered 2020-01-27: 10 mL

## 2020-01-27 MED ORDER — FAMOTIDINE IN NACL 20-0.9 MG/50ML-% IV SOLN
20.0000 mg | Freq: Two times a day (BID) | INTRAVENOUS | Status: AC
  Administered 2020-01-27 (×2): 20 mg via INTRAVENOUS
  Filled 2020-01-27: qty 50

## 2020-01-27 MED ORDER — CHLORHEXIDINE GLUCONATE 0.12 % MT SOLN
15.0000 mL | OROMUCOSAL | Status: AC
  Administered 2020-01-27: 15 mL via OROMUCOSAL

## 2020-01-27 MED ORDER — VANCOMYCIN HCL IN DEXTROSE 1-5 GM/200ML-% IV SOLN
1000.0000 mg | Freq: Once | INTRAVENOUS | Status: AC
  Administered 2020-01-27: 1000 mg via INTRAVENOUS
  Filled 2020-01-27: qty 200

## 2020-01-27 MED ORDER — BUPIVACAINE HCL (PF) 0.5 % IJ SOLN
INTRAMUSCULAR | Status: AC
  Filled 2020-01-27: qty 30

## 2020-01-27 MED ORDER — PROPOFOL 10 MG/ML IV BOLUS
INTRAVENOUS | Status: AC
  Filled 2020-01-27: qty 20

## 2020-01-27 MED ORDER — ORAL CARE MOUTH RINSE
15.0000 mL | Freq: Two times a day (BID) | OROMUCOSAL | Status: DC
  Administered 2020-01-27 – 2020-02-03 (×15): 15 mL via OROMUCOSAL

## 2020-01-27 MED ORDER — VANCOMYCIN HCL 1000 MG IV SOLR
INTRAVENOUS | Status: DC | PRN
  Administered 2020-01-27 (×3): 1000 mg via TOPICAL

## 2020-01-27 MED ORDER — HEMOSTATIC AGENTS (NO CHARGE) OPTIME
TOPICAL | Status: DC | PRN
  Administered 2020-01-27 (×5): 1 via TOPICAL

## 2020-01-27 MED ORDER — PANTOPRAZOLE SODIUM 40 MG PO TBEC
40.0000 mg | DELAYED_RELEASE_TABLET | Freq: Every day | ORAL | Status: DC
  Administered 2020-01-29 – 2020-02-04 (×7): 40 mg via ORAL
  Filled 2020-01-27 (×7): qty 1

## 2020-01-27 MED ORDER — ACETAMINOPHEN 650 MG RE SUPP
650.0000 mg | Freq: Once | RECTAL | Status: AC
  Administered 2020-01-27: 650 mg via RECTAL

## 2020-01-27 MED ORDER — MAGNESIUM SULFATE 4 GM/100ML IV SOLN
4.0000 g | Freq: Once | INTRAVENOUS | Status: AC
  Administered 2020-01-27: 4 g via INTRAVENOUS

## 2020-01-27 MED ORDER — EPINEPHRINE HCL 5 MG/250ML IV SOLN IN NS
0.0000 ug/min | INTRAVENOUS | Status: DC
  Administered 2020-01-28: 5 ug/min via INTRAVENOUS
  Filled 2020-01-27: qty 250

## 2020-01-27 MED ORDER — ACETAMINOPHEN 500 MG PO TABS
1000.0000 mg | ORAL_TABLET | Freq: Four times a day (QID) | ORAL | Status: AC
  Administered 2020-01-27 – 2020-02-01 (×19): 1000 mg via ORAL
  Filled 2020-01-27 (×18): qty 2

## 2020-01-27 MED ORDER — ALBUMIN HUMAN 5 % IV SOLN
250.0000 mL | INTRAVENOUS | Status: AC | PRN
  Administered 2020-01-27 (×2): 12.5 g via INTRAVENOUS

## 2020-01-27 MED ORDER — SODIUM CHLORIDE 0.45 % IV SOLN: INTRAVENOUS | Status: DC | PRN

## 2020-01-27 MED ORDER — PLASMA-LYTE 148 IV SOLN
INTRAVENOUS | Status: DC | PRN
  Administered 2020-01-27: 500 mL via INTRAVASCULAR

## 2020-01-27 MED ORDER — ASPIRIN 81 MG PO CHEW: 324.0000 mg | CHEWABLE_TABLET | Freq: Every day | ORAL | Status: DC

## 2020-01-27 MED ORDER — ROCURONIUM BROMIDE 10 MG/ML (PF) SYRINGE
PREFILLED_SYRINGE | INTRAVENOUS | Status: DC | PRN
  Administered 2020-01-27 (×3): 50 mg via INTRAVENOUS
  Administered 2020-01-27: 100 mg via INTRAVENOUS
  Administered 2020-01-27: 50 mg via INTRAVENOUS

## 2020-01-27 MED ORDER — ETOMIDATE 2 MG/ML IV SOLN
INTRAVENOUS | Status: DC | PRN
  Administered 2020-01-27: 12 mg via INTRAVENOUS

## 2020-01-27 MED ORDER — INSULIN REGULAR(HUMAN) IN NACL 100-0.9 UT/100ML-% IV SOLN
INTRAVENOUS | Status: DC
  Administered 2020-01-28: 1.1 [IU]/h via INTRAVENOUS
  Filled 2020-01-27: qty 100

## 2020-01-27 MED ORDER — METOPROLOL TARTRATE 5 MG/5ML IV SOLN: 2.5000 mg | INTRAVENOUS | Status: DC | PRN

## 2020-01-27 MED ORDER — MIDAZOLAM HCL 2 MG/2ML IJ SOLN: 2.0000 mg | INTRAMUSCULAR | Status: DC | PRN

## 2020-01-27 MED ORDER — ACETAMINOPHEN 160 MG/5ML PO SOLN: 1000.0000 mg | Freq: Four times a day (QID) | ORAL | Status: AC

## 2020-01-27 MED ORDER — BISACODYL 5 MG PO TBEC
10.0000 mg | DELAYED_RELEASE_TABLET | Freq: Every day | ORAL | Status: DC
  Administered 2020-01-28 – 2020-02-04 (×7): 10 mg via ORAL
  Filled 2020-01-27 (×8): qty 2

## 2020-01-27 MED ORDER — POTASSIUM CHLORIDE 10 MEQ/50ML IV SOLN: 10.0000 meq | INTRAVENOUS | Status: AC

## 2020-01-27 MED ORDER — SODIUM CHLORIDE 0.9 % IV SOLN: 250.0000 mL | INTRAVENOUS | Status: DC

## 2020-01-27 MED ORDER — 0.9 % SODIUM CHLORIDE (POUR BTL) OPTIME
TOPICAL | Status: DC | PRN
  Administered 2020-01-27: 5000 mL

## 2020-01-27 MED ORDER — BISACODYL 10 MG RE SUPP
10.0000 mg | Freq: Every day | RECTAL | Status: DC
  Administered 2020-02-03: 10 mg via RECTAL
  Filled 2020-01-27: qty 1

## 2020-01-27 SURGICAL SUPPLY — 157 items
ADAPTER CARDIO PERF ANTE/RETRO (ADAPTER) ×6 IMPLANT
ADH SKN CLS APL DERMABOND .7 (GAUZE/BANDAGES/DRESSINGS) ×5
ADPR PRFSN 84XANTGRD RTRGD (ADAPTER) ×5
ADPR TBG 2 MALE LL ART (MISCELLANEOUS) ×5
ANCHOR CATH FOLEY SECURE (MISCELLANEOUS) ×18 IMPLANT
APL SRG 7X2 LUM MLBL SLNT (VASCULAR PRODUCTS)
APPLICATOR TIP COSEAL (VASCULAR PRODUCTS) IMPLANT
APPLIER CLIP 9.375 SM OPEN (CLIP)
APR CLP SM 9.3 20 MLT OPN (CLIP)
ATRICLIP EXCLUSION VLAA 35 (Miscellaneous) ×6 IMPLANT
BAG DECANTER FOR FLEXI CONT (MISCELLANEOUS) ×6 IMPLANT
BLADE CLIPPER SURG (BLADE) ×6 IMPLANT
BLADE STERNUM SYSTEM 6 (BLADE) ×6 IMPLANT
BLADE SURG 15 STRL LF DISP TIS (BLADE) ×5 IMPLANT
BLADE SURG 15 STRL SS (BLADE) ×6
BNDG ELASTIC 4X5.8 VLCR STR LF (GAUZE/BANDAGES/DRESSINGS) ×7 IMPLANT
BNDG ELASTIC 6X5.8 VLCR STR LF (GAUZE/BANDAGES/DRESSINGS) ×6 IMPLANT
BNDG GAUZE ELAST 4 BULKY (GAUZE/BANDAGES/DRESSINGS) ×7 IMPLANT
CANISTER SUCT 3000ML PPV (MISCELLANEOUS) ×6 IMPLANT
CANNULA NON VENT 22FR 12 (CANNULA) ×1 IMPLANT
CATH CPB KIT HENDRICKSON (MISCELLANEOUS) ×6 IMPLANT
CATH DIAG EXPO 6F AL1 (CATHETERS) ×5 IMPLANT
CATH INFINITI 6F MPB2 (CATHETERS) ×5 IMPLANT
CATH ROBINSON RED A/P 18FR (CATHETERS) ×12 IMPLANT
CLIP APPLIE 9.375 SM OPEN (CLIP) ×5 IMPLANT
CLIP LIGATING EXTRA MED SLVR (CLIP) ×5 IMPLANT
CLIP RETRACTION 3.0MM CORONARY (MISCELLANEOUS) ×6 IMPLANT
CLIP VESOCCLUDE MED 24/CT (CLIP) ×1 IMPLANT
CLIP VESOCCLUDE SM WIDE 24/CT (CLIP) ×4 IMPLANT
CONN ST 1/4X3/8  BEN (MISCELLANEOUS) ×18
CONN ST 1/4X3/8 BEN (MISCELLANEOUS) IMPLANT
COVER MAYO STAND STRL (DRAPES) ×7 IMPLANT
CUFF TOURN SGL QUICK 18X4 (TOURNIQUET CUFF) IMPLANT
CUFF TOURN SGL QUICK 24 (TOURNIQUET CUFF)
CUFF TRNQT CYL 24X4X16.5-23 (TOURNIQUET CUFF) IMPLANT
DERMABOND ADVANCED (GAUZE/BANDAGES/DRESSINGS) ×1
DERMABOND ADVANCED .7 DNX12 (GAUZE/BANDAGES/DRESSINGS) ×5 IMPLANT
DEVICE EXCLUSIN ATRCLP VLAA 35 (Miscellaneous) IMPLANT
DRAIN CHANNEL 28F RND 3/8 FF (WOUND CARE) ×19 IMPLANT
DRAPE C-ARM 42X72 X-RAY (DRAPES) ×10 IMPLANT
DRAPE CARDIOVASCULAR INCISE (DRAPES) ×6
DRAPE CV SPLIT W-CLR ANES SCRN (DRAPES) ×5 IMPLANT
DRAPE EXTREMITY T 121X128X90 (DISPOSABLE) ×6 IMPLANT
DRAPE HALF SHEET 40X57 (DRAPES) ×6 IMPLANT
DRAPE PERI GROIN 82X75IN TIB (DRAPES) ×5 IMPLANT
DRAPE SLUSH/WARMER DISC (DRAPES) ×6 IMPLANT
DRAPE SRG 135X102X78XABS (DRAPES) ×5 IMPLANT
DRSG AQUACEL AG ADV 3.5X14 (GAUZE/BANDAGES/DRESSINGS) ×6 IMPLANT
ELECT CAUTERY BLADE 6.4 (BLADE) ×6 IMPLANT
ELECT REM PT RETURN 9FT ADLT (ELECTROSURGICAL) ×12
ELECTRODE REM PT RTRN 9FT ADLT (ELECTROSURGICAL) ×10 IMPLANT
FELT TEFLON 1X6 (MISCELLANEOUS) ×11 IMPLANT
GAUZE SPONGE 4X4 12PLY STRL (GAUZE/BANDAGES/DRESSINGS) ×13 IMPLANT
GAUZE SPONGE 4X4 12PLY STRL LF (GAUZE/BANDAGES/DRESSINGS) ×3 IMPLANT
GEL ULTRASOUND 20GR AQUASONIC (MISCELLANEOUS) ×5 IMPLANT
GLOVE BIO SURGEON STRL SZ 6 (GLOVE) ×4 IMPLANT
GLOVE BIO SURGEON STRL SZ 6.5 (GLOVE) ×1 IMPLANT
GLOVE BIO SURGEON STRL SZ7.5 (GLOVE) ×1 IMPLANT
GLOVE BIO SURGEON STRL SZ8 (GLOVE) ×1 IMPLANT
GLOVE BIOGEL PI IND STRL 6 (GLOVE) IMPLANT
GLOVE BIOGEL PI IND STRL 6.5 (GLOVE) IMPLANT
GLOVE BIOGEL PI IND STRL 7.5 (GLOVE) IMPLANT
GLOVE BIOGEL PI IND STRL 8.5 (GLOVE) IMPLANT
GLOVE BIOGEL PI INDICATOR 6 (GLOVE) ×4
GLOVE BIOGEL PI INDICATOR 6.5 (GLOVE) ×1
GLOVE BIOGEL PI INDICATOR 7.5 (GLOVE) ×3
GLOVE BIOGEL PI INDICATOR 8.5 (GLOVE) ×1
GLOVE NEODERM STRL 7.5  LF PF (GLOVE) ×20
GLOVE NEODERM STRL 7.5 LF PF (GLOVE) ×15 IMPLANT
GLOVE SURG NEODERM 7.5  LF PF (GLOVE) ×4
GLOVE SURG SS PI 7.5 STRL IVOR (GLOVE) ×3 IMPLANT
GOWN STRL REUS W/ TWL LRG LVL3 (GOWN DISPOSABLE) ×20 IMPLANT
GOWN STRL REUS W/ TWL XL LVL3 (GOWN DISPOSABLE) IMPLANT
GOWN STRL REUS W/TWL LRG LVL3 (GOWN DISPOSABLE) ×78
GOWN STRL REUS W/TWL XL LVL3 (GOWN DISPOSABLE) ×6
HEMOSTAT POWDER SURGIFOAM 1G (HEMOSTASIS) ×12 IMPLANT
INSERT FOGARTY SM (MISCELLANEOUS) ×5 IMPLANT
INSERT FOGARTY XLG (MISCELLANEOUS) ×1 IMPLANT
INSERT SUTURE HOLDER (MISCELLANEOUS) ×6 IMPLANT
IV ADAPTER SYR DOUBLE MALE LL (MISCELLANEOUS) ×1 IMPLANT
KIT APPLICATOR RATIO 11:1 (KITS) ×2 IMPLANT
KIT BASIN OR (CUSTOM PROCEDURE TRAY) ×6 IMPLANT
KIT SUCTION CATH 14FR (SUCTIONS) ×6 IMPLANT
KIT TURNOVER KIT B (KITS) ×6 IMPLANT
KIT VASOVIEW HEMOPRO 2 VH 4000 (KITS) ×6 IMPLANT
LIGACLIP SM TITANIUM (CLIP) ×5 IMPLANT
LOOP VESSEL MINI RED (MISCELLANEOUS) ×5 IMPLANT
MARKER GRAFT CORONARY BYPASS (MISCELLANEOUS) ×15 IMPLANT
NDL 18GX1X1/2 (RX/OR ONLY) (NEEDLE) ×5 IMPLANT
NEEDLE 18GX1X1/2 (RX/OR ONLY) (NEEDLE) ×6 IMPLANT
NS IRRIG 1000ML POUR BTL (IV SOLUTION) ×30 IMPLANT
PACK CHEST (CUSTOM PROCEDURE TRAY) ×5 IMPLANT
PACK E OPEN HEART (SUTURE) ×6 IMPLANT
PACK OPEN HEART (CUSTOM PROCEDURE TRAY) ×6 IMPLANT
PACK PLATELET PROCEDURE 60 (MISCELLANEOUS) ×1 IMPLANT
PACK SPY-PHI (KITS) ×1 IMPLANT
PAD ARMBOARD 7.5X6 YLW CONV (MISCELLANEOUS) ×12 IMPLANT
PAD ELECT DEFIB RADIOL ZOLL (MISCELLANEOUS) ×6 IMPLANT
PENCIL BUTTON HOLSTER BLD 10FT (ELECTRODE) ×7 IMPLANT
POSITIONER HEAD DONUT 9IN (MISCELLANEOUS) ×5 IMPLANT
POWDER SURGICEL 3.0 GRAM (HEMOSTASIS) ×7 IMPLANT
PUMP SET IMPELLA 5.5 US (CATHETERS) ×5 IMPLANT
PUNCH AORTIC ROTATE 4.5MM 8IN (MISCELLANEOUS) ×1 IMPLANT
SEALANT SURG COSEAL 4ML (VASCULAR PRODUCTS) ×5 IMPLANT
SEALANT SURG COSEAL 8ML (VASCULAR PRODUCTS) ×6 IMPLANT
SET CARDIOPLEGIA MPS 5001102 (MISCELLANEOUS) ×1 IMPLANT
SHEARS HARMONIC 9CM CVD (BLADE) ×1 IMPLANT
SPONGE LAP 18X18 RF (DISPOSABLE) ×2 IMPLANT
STAPLER VISISTAT 35W (STAPLE) ×1 IMPLANT
STOPCOCK 4 WAY LG BORE MALE ST (IV SETS) IMPLANT
SUPPORT HEART JANKE-BARRON (MISCELLANEOUS) ×6 IMPLANT
SUT BONE WAX W31G (SUTURE) ×6 IMPLANT
SUT ETHILON 3 0 PS 1 (SUTURE) ×10 IMPLANT
SUT MNCRL AB 3-0 PS2 18 (SUTURE) ×12 IMPLANT
SUT MNCRL AB 4-0 PS2 18 (SUTURE) ×1 IMPLANT
SUT PDS AB 1 CTX 36 (SUTURE) ×12 IMPLANT
SUT PROLENE 3 0 SH DA (SUTURE) ×6 IMPLANT
SUT PROLENE 3 0 SH1 36 (SUTURE) ×3 IMPLANT
SUT PROLENE 4 0 RB 1 (SUTURE) ×12
SUT PROLENE 4 0 SH DA (SUTURE) ×1 IMPLANT
SUT PROLENE 4-0 RB1 .5 CRCL 36 (SUTURE) IMPLANT
SUT PROLENE 5 0 C 1 36 (SUTURE) ×10 IMPLANT
SUT PROLENE 6 0 C 1 30 (SUTURE) ×19 IMPLANT
SUT PROLENE 7 0 BV 1 (SUTURE) ×4 IMPLANT
SUT PROLENE 7 0 BV1 MDA (SUTURE) ×1 IMPLANT
SUT PROLENE 8 0 BV175 6 (SUTURE) IMPLANT
SUT PROLENE BLUE 7 0 (SUTURE) ×6 IMPLANT
SUT SILK  1 MH (SUTURE) ×12
SUT SILK 1 MH (SUTURE) ×20 IMPLANT
SUT SILK 1 TIES 10X30 (SUTURE) ×5 IMPLANT
SUT SILK 2 0 SH CR/8 (SUTURE) IMPLANT
SUT SILK 3 0 SH CR/8 (SUTURE) IMPLANT
SUT STEEL 6MS V (SUTURE) ×6 IMPLANT
SUT STEEL SZ 6 DBL 3X14 BALL (SUTURE) ×6 IMPLANT
SUT VIC AB 2-0 CT1 27 (SUTURE) ×12
SUT VIC AB 2-0 CT1 TAPERPNT 27 (SUTURE) IMPLANT
SUT VIC AB 2-0 CTX 27 (SUTURE) IMPLANT
SUT VIC AB 3-0 SH 27 (SUTURE)
SUT VIC AB 3-0 SH 27X BRD (SUTURE) IMPLANT
SUT VIC AB 3-0 X1 27 (SUTURE) IMPLANT
SYR 10ML LL (SYRINGE) IMPLANT
SYR 30ML LL (SYRINGE) ×7 IMPLANT
SYR 3ML LL SCALE MARK (SYRINGE) ×5 IMPLANT
SYR 50ML SLIP (SYRINGE) IMPLANT
SYR BULB IRRIG 60ML STRL (SYRINGE) ×1 IMPLANT
SYSTEM SAHARA CHEST DRAIN ATS (WOUND CARE) ×6 IMPLANT
TAPE CLOTH SURG 4X10 WHT LF (GAUZE/BANDAGES/DRESSINGS) ×1 IMPLANT
TAPE PAPER 2X10 WHT MICROPORE (GAUZE/BANDAGES/DRESSINGS) ×1 IMPLANT
TIP DUAL SPRAY TOPICAL (TIP) ×2 IMPLANT
TOWEL GREEN STERILE (TOWEL DISPOSABLE) ×6 IMPLANT
TOWEL GREEN STERILE FF (TOWEL DISPOSABLE) ×6 IMPLANT
TRAY FOLEY SLVR 16FR TEMP STAT (SET/KITS/TRAYS/PACK) ×6 IMPLANT
TUBING ART PRESS 48 MALE/FEM (TUBING) ×2 IMPLANT
TUBING LAP HI FLOW INSUFFLATIO (TUBING) ×6 IMPLANT
UNDERPAD 30X36 HEAVY ABSORB (UNDERPADS AND DIAPERS) ×7 IMPLANT
WATER STERILE IRR 1000ML POUR (IV SOLUTION) ×12 IMPLANT
WATER STERILE IRR 1000ML UROMA (IV SOLUTION) IMPLANT

## 2020-01-27 NOTE — OR Nursing (Signed)
Forty-five minute call to [facility] Charge Nurse at [phone]. Spoke to [place].

## 2020-01-27 NOTE — H&P (Signed)
History and Physical Interval Note:  01/27/2020 7:36 AM  Douglas Carr  has presented today for surgery, with the diagnosis of CAD CHF.  The various methods of treatment have been discussed with the patient and family. After consideration of risks, benefits and other options for treatment, the patient has consented to  Procedure(s) with comments: CORONARY ARTERY BYPASS GRAFTING (CABG) (N/A) - possible bilateral IMA possible PLACEMENT OF IMPELLA LEFT VENTRICULAR ASSIST DEVICE (N/A) possible RADIAL ARTERY HARVEST (Left) TRANSESOPHAGEAL ECHOCARDIOGRAM (TEE) (N/A) INDOCYANINE GREEN FLUORESCENCE IMAGING (ICG) (N/A) as a surgical intervention.  The patient's history has been reviewed, patient examined, no change in status, stable for surgery.  I have reviewed the patient's chart and labs.  Questions were answered to the patient's satisfaction.     Linden Dolin

## 2020-01-27 NOTE — Progress Notes (Signed)
PROGRESS NOTE    Douglas Carr  YQI:[id number] DOB: 03/23/1966 DOA: 01/22/2020 PCP: [name], [name], MD    Brief Narrative:  54 year old man PMH including ischemic cardiomyopathy, systolic CHF and diabetes presented with shortness of breath and chest pain. Admitted for acute decompensated systolic CHF, NSTEMI.  Transferred to [facility] under cardiology service for heart catheterization.  Developed hypotension requiring Levophed. TRH managing DM.  Assessment & Plan:   Principal Problem:   NSTEMI (non-ST elevated myocardial infarction) (HCC) Active Problems:   Essential hypertension   Chronic combined systolic and diastolic HF (heart failure), NYHA class 3 (HCC)   Coronary artery disease involving native coronary artery of native heart with angina pectoris (HCC)   Diabetes mellitus with complication (HCC)   Ischemic cardiomyopathy   Anxiety and depression   Hyperlipidemia   Heart failure (HCC)   STEMI (ST elevation myocardial infarction) (HCC)   Elevated troponin   S/P CABG x 5  NSTEMI (non-ST elevated myocardial infarction) (HCC) --Status post left heart cath showing severe three-vessel disease, moderate pulmonary artery hypertension. --Management deferred to CT surgery. Pt now s/p CABG in ICU and intubated  Diabetes mellitus with complication (HCC) --glucose trends thus far stable  --appreciate insightful input by Diabetic Coordinator. Pt is historically noncompliant with his insulin regimen --Currently post CABG, pt is in ICU and on insulin gtt --agree with re-dosing insulin while in hospital with plan to gradually titrate up basal insulin once CBG is consistently >150 when pt is off insulin gtt and tolerating diet --Recommendation for future compliance done face to face with family present, especially given heart disease --Would cont to follow recs by Diabetic Coordinator  Acute on chronic systolic failure. --Cont to diurese per Cardiology  Will sign off for  now, thank you for this consult.   Antimicrobials: Anti-infectives (From admission, onward)   Start     Dose/Rate Route Frequency Ordered Stop   01/27/20 2200  vancomycin (VANCOCIN) IVPB 1000 mg/200 mL premix        1,000 mg 200 mL/hr over 60 Minutes Intravenous  Once 01/27/20 1502     01/27/20 1800  cefUROXime (ZINACEF) 1.5 g in sodium chloride 0.9 % 100 mL IVPB        1.5 g 200 mL/hr over 30 Minutes Intravenous Every 12 hours 01/27/20 1502 01/29/20 1759   01/27/20 1043  vancomycin (VANCOCIN) powder  Status:  Discontinued          As needed 01/27/20 1044 01/27/20 1502   01/27/20 0400  vancomycin (VANCOREADY) IVPB 1500 mg/300 mL        1,500 mg 150 mL/hr over 120 Minutes Intravenous To Surgery 01/26/20 0902 01/27/20 1024   01/27/20 0400  cefUROXime (ZINACEF) 1.5 g in sodium chloride 0.9 % 100 mL IVPB        1.5 g 200 mL/hr over 30 Minutes Intravenous To Surgery 01/26/20 0902 01/27/20 0849   01/27/20 0400  cefUROXime (ZINACEF) 750 mg in sodium chloride 0.9 % 100 mL IVPB        750 mg 200 mL/hr over 30 Minutes Intravenous To Surgery 01/26/20 0902 01/27/20 1403      Subjective: Pt was in OR for CABG today, chart was reviewed including glucose trends  Objective: Vitals:   01/27/20 1715 01/27/20 1730 01/27/20 1745 01/27/20 1800  BP:  (!) 81/48    Pulse: 94  96 93  Resp: 12 12 17 13   Temp: 98.6 F (37 C) 98.4 F (36.9 C) 98.6 F (37 C) 98.8  F (37.1 C)  TempSrc:  Core (Comment)  Core (Comment)  SpO2: 100%  100% 100%  Weight:      Height:        Intake/Output Summary (Last 24 hours) at 01/27/2020 1804 Last data filed at 01/27/2020 1800 Gross per 24 hour  Intake 4187.68 ml  Output 3037 ml  Net 1150.68 ml   Filed Weights   01/22/20 1621 01/23/20 1800 01/26/20 0245  Weight: 90.7 kg 94.7 kg 93.3 kg   Physical Exam Pt had been in OR today for CABG but chart and glycemic trends were reviewed   Data Reviewed: I have personally reviewed following labs and imaging  studies  CBC: Recent Labs  Lab 01/22/20 1614 01/23/20 0123 01/25/20 0241 01/25/20 0241 01/26/20 0310 01/26/20 0310 01/26/20 1422 01/26/20 1422 01/27/20 0330 01/27/20 0821 01/27/20 1300 01/27/20 1335 01/27/20 1411 01/27/20 1415 01/27/20 1503  WBC 11.5*   < > 9.7  --  7.5  --  7.3  --  6.9  --   --   --   --   --  18.6*  NEUTROABS 8.3*  --   --   --   --   --   --   --   --   --   --   --   --   --   --   HGB 14.7   < > 12.5*   < > 12.6*   < > 13.0   < > 11.5*   < > 8.9* 8.5* 8.8* 9.2* 10.5*  HCT 45.5   < > 39.5   < > 39.9   < > 41.8   < > 37.0*   < > 27.6* 25.0* 26.0* 27.0* 33.8*  MCV 87.8   < > 87.8  --  90.1  --  89.9  --  89.8  --   --   --   --   --  89.4  PLT 207   < > 202   < > 190  --  197  --  188  --  156  --   --   --  187   < > = values in this interval not displayed.   Basic Metabolic Panel: Recent Labs  Lab 01/24/20 0434 01/24/20 1200 01/25/20 0241 01/25/20 0241 01/26/20 0310 01/26/20 0310 01/26/20 1422 01/26/20 1422 01/27/20 0330 01/27/20 0821 01/27/20 1122 01/27/20 1134 01/27/20 1141 01/27/20 1141 01/27/20 1204 01/27/20 1204 01/27/20 1210 01/27/20 1242 01/27/20 1335 01/27/20 1411 01/27/20 1415  NA 136   < > 136   < > 135   < > 137   < > 135   < > 138   < > 140   < > 138   < > 139 138 138 140 139  K 3.9   < > 3.7   < > 3.7   < > 4.3   < > 3.9   < > 4.2   < > 4.0   < > 4.3   < > 4.5 4.3 5.0 4.3 4.2  CL 103  --  102   < > 100   < > 100   < > 98   < > 101  --  98  --  100  --   --   --  100  --  101  CO2 25  --  27  --  28  --  29  --  30  --   --   --   --   --   --   --   --   --   --   --   --  GLUCOSE 112*  --  68*   < > 147*   < > 154*   < > 185*   < > 100*  --  89  --  93  --   --   --  102*  --  117*  BUN 32*  --  27*   < > 22*   < > 21*   < > 19   < > 18  --  17  --  18  --   --   --  19  --  17  CREATININE 0.92  --  0.91   < > 0.95   < > 1.01   < > 0.81   < > 0.70  --  0.70  --  0.80  --   --   --  0.90  --  1.00  CALCIUM 8.1*  --  8.2*   --  7.9*  --  8.5*  --  8.1*  --   --   --   --   --   --   --   --   --   --   --   --    < > = values in this interval not displayed.   GFR: Estimated Creatinine Clearance: 98 mL/min (by C-G formula based on SCr of 1 mg/dL). Liver Function Tests: Recent Labs  Lab 01/22/20 1614  AST 28  ALT 38  ALKPHOS 92  BILITOT 0.6  PROT 7.2  ALBUMIN 3.3*   No results for input(s): LIPASE, AMYLASE in the last 168 hours. No results for input(s): AMMONIA in the last 168 hours. Coagulation Profile: Recent Labs  Lab 01/26/20 1029 01/27/20 1503  INR 1.1 1.5*   Cardiac Enzymes: No results for input(s): CKTOTAL, CKMB, CKMBINDEX, TROPONINI in the last 168 hours. BNP (last 3 results) No results for input(s): PROBNP in the last 8760 hours. HbA1C: Recent Labs    01/27/20 0330  HGBA1C 13.8*   CBG: Recent Labs  Lab 01/26/20 1645 01/26/20 2118 01/27/20 0605 01/27/20 1549 01/27/20 1718  GLUCAP 158* 182* 177* 116* 132*   Lipid Profile: No results for input(s): CHOL, HDL, LDLCALC, TRIG, CHOLHDL, LDLDIRECT in the last 72 hours. Thyroid Function Tests: No results for input(s): TSH, T4TOTAL, FREET4, T3FREE, THYROIDAB in the last 72 hours. Anemia Panel: No results for input(s): VITAMINB12, FOLATE, FERRITIN, TIBC, IRON, RETICCTPCT in the last 72 hours. Sepsis Labs: No results for input(s): PROCALCITON, LATICACIDVEN in the last 168 hours.  Recent Results (from the past 240 hour(s))  SARS Coronavirus 2 by RT PCR (hospital order, performed in [facility] hospital lab) Nasopharyngeal Nasopharyngeal Swab     Status: None   Collection Time: 01/22/20  4:24 PM   Specimen: Nasopharyngeal Swab  Result Value Ref Range Status   SARS Coronavirus 2 NEGATIVE NEGATIVE Final    Comment: (NOTE) SARS-CoV-2 target nucleic acids are NOT DETECTED.  The SARS-CoV-2 RNA is generally detectable in upper and lower respiratory specimens during the acute phase of infection. The lowest concentration of SARS-CoV-2  viral copies this assay can detect is 250 copies / mL. A negative result does not preclude SARS-CoV-2 infection and should not be used as the sole basis for treatment or other patient management decisions.  A negative result may occur with improper specimen collection / handling, submission of specimen other than nasopharyngeal swab, presence of viral mutation(s) within the areas targeted by this assay, and inadequate number of viral copies (<250 copies / mL). A negative result  must be combined with clinical observations, patient history, and epidemiological information.  Fact Sheet for Patients:   BoilerBrush.com.cy  Fact Sheet for Healthcare Providers: https://pope.com/  This test is not yet approved or  cleared by the [place] FDA and has been authorized for detection and/or diagnosis of SARS-CoV-2 by FDA under an Emergency Use Authorization (EUA).  This EUA will remain in effect (meaning this test can be used) for the duration of the COVID-19 declaration under Section 564(b)(1) of the Act, 21 U.S.C. section 360bbb-3(b)(1), unless the authorization is terminated or revoked sooner.  Performed at [facility], [address] [place], [place] [address]   MRSA PCR Screening     Status: Abnormal   Collection Time: 01/24/20  8:33 AM   Specimen: Nasopharyngeal  Result Value Ref Range Status   MRSA by PCR POSITIVE (A) NEGATIVE Final    Comment:        The GeneXpert MRSA Assay (FDA approved for NASAL specimens only), is one component of a comprehensive MRSA colonization surveillance program. It is not intended to diagnose MRSA infection nor to guide or monitor treatment for MRSA infections. RESULT CALLED TO, READ BACK BY AND VERIFIED WITH: Delice Bison RN 10:05 01/24/20 ([email or web address]) Performed at [facility], [address] [place], [place] [address]   Surgical pcr screen     Status: Abnormal   Collection Time: 01/26/20 10:55 AM    Specimen: Nasal Mucosa; Nasal Swab  Result Value Ref Range Status   MRSA, PCR POSITIVE (A) NEGATIVE Final    Comment: RESULT CALLED TO, READ BACK BY AND VERIFIED WITH: RN J. CVIJETIC [id number] [phone] FCP    Staphylococcus aureus POSITIVE (A) NEGATIVE Final    Comment: (NOTE) The Xpert SA Assay (FDA approved for NASAL specimens in patients 65 years of age and older), is one component of a comprehensive surveillance program. It is not intended to diagnose infection nor to guide or monitor treatment. Performed at [facility], [address] [place], [place] [address]      Radiology Studies: DG Chest Port 1 View  Result Date: 01/27/2020 CLINICAL DATA:  Status post multivessel CABG EXAM: PORTABLE CHEST 1 VIEW COMPARISON:  01/25/2020 FINDINGS: Single frontal view of the chest demonstrates stable single lead pacer. Endotracheal tube overlies tracheal air column, tip well above carina. Enteric catheter passes below diaphragm tip excluded by collimation. Right internal jugular flow directed central venous catheter tip overlies main pulmonary outflow tract. Right-sided PICC tip overlies superior vena cava. Median sternotomy wires, surgical clips, and epicardial pacing wires overlie the mediastinum. Cardiac silhouette is enlarged but stable. Mild central vascular congestion and small left pleural effusion. No airspace disease or pneumothorax. IMPRESSION: 1. Support devices as above. 2. Central vascular congestion and small left pleural effusion. Electronically Signed   By: Sharlet Salina M.D.   On: 01/27/2020 15:55   ECHO INTRAOPERATIVE TEE  Result Date: 01/27/2020  *INTRAOPERATIVE TRANSESOPHAGEAL REPORT *  Patient Name:   Douglas Carr [facility] Date of Exam: 01/27/2020 Medical Rec #:  [id number]           Height:       70.0 in Accession #:    [id number]          Weight:       205.7 lb Date of Birth:  Oct 11, 1965           BSA:          2.11 m Patient Age:    5 years  BP:           106/66  mmHg Patient Gender: M                   HR:           76 bpm. Exam Location:  Anesthesiology Transesophogeal exam was perform intraoperatively during surgical procedure. Patient was closely monitored under general anesthesia during the entirety of examination. Indications:     CAD Sonographer:     Lavenia Atlas RDCS Performing Phys: Gaynelle Adu MD Complications: No known complications during this procedure. POST-OP IMPRESSIONS - Left Ventricle: Post bypass the left ventricle was much improved from prior exam. LV function now estimated to be 40-45%. - Aortic Valve: The aortic valve appears unchanged from pre-bypass. - Mitral Valve: The mitral valve appears unchanged from pre-bypass. - Tricuspid Valve: The tricuspid valve appears unchanged from pre-bypass. PRE-OP FINDINGS  Left Ventricle: The left ventricle has severely reduced systolic function, with an ejection fraction of 25-30%. The cavity size was severely dilated. There is no increase in left ventricular wall thickness. Right Ventricle: The right ventricle has normal systolic function. The cavity was normal. There is no increase in right ventricular wall thickness. Left Atrium: Left atrial size was not assessed. The left atrial appendage is well visualized and there is evidence of thrombus present. Right Atrium: Right atrial size was not assessed. Interatrial Septum: No atrial level shunt detected by color flow Doppler. Pericardium: A small pericardial effusion is present. The pericardial effusion is localized near the right ventricle. Mitral Valve: The mitral valve is normal in structure. Mild thickening of the mitral valve leaflet. Mild calcification of the mitral valve leaflet. Mitral valve regurgitation is mild to moderate by color flow Doppler. The MR jet is centrally-directed. Tricuspid Valve: The tricuspid valve was normal in structure. Tricuspid valve regurgitation was not visualized by color flow Doppler. Aortic Valve: The aortic valve is  normal in structure. Aortic valve regurgitation was not visualized by color flow Doppler. There is no evidence of aortic valve stenosis. Pulmonic Valve: The pulmonic valve was normal in structure. Pulmonic valve regurgitation is trivial by color flow Doppler. +--------------+--------++ LEFT VENTRICLE         +--------------+--------++ PLAX 2D                +--------------+--------++ LVIDd:        6.67 cm  +--------------+--------++ LVIDs:        5.90 cm  +--------------+--------++ LVOT diam:    2.10 cm  +--------------+--------++ LV SV:        56 ml    +--------------+--------++ LV SV Index:  25.73    +--------------+--------++ LVOT Area:    3.46 cm +--------------+--------++                        +--------------+--------++  +--------------+-------+ SHUNTS                +--------------+-------+ Systemic Diam:2.10 cm +--------------+-------+  Gaynelle Adu MD Electronically signed by Gaynelle Adu MD Signature Date/Time: 01/27/2020/3:42:23 PM    Final    VAS US DOPPLER PRE CABG  Result Date: 01/26/2020 PREOPERATIVE VASCULAR EVALUATION  Indications:            Pre-CABG. Risk Factors:           Hypertension, hyperlipidemia, Diabetes, coronary artery                         disease. Vascular  Interventions: Cardiomyopathy. Performing Technologist: Sherren Kerns RVS  Examination Guidelines: A complete evaluation includes B-mode imaging, spectral Doppler, color Doppler, and power Doppler as needed of all accessible portions of each vessel. Bilateral testing is considered an integral part of a complete examination. Limited examinations for reoccurring indications may be performed as noted.  Right Carotid Findings: +----------+--------+--------+--------+--------+--------+           PSV cm/sEDV cm/sStenosisDescribeComments +----------+--------+--------+--------+--------+--------+ CCA Prox                  Occluded                  +----------+--------+--------+--------+--------+--------+ CCA Mid                   Occluded                 +----------+--------+--------+--------+--------+--------+ CCA Distal                Occluded                 +----------+--------+--------+--------+--------+--------+ ICA Prox                  Occluded                 +----------+--------+--------+--------+--------+--------+ ICA Mid                   Occluded                 +----------+--------+--------+--------+--------+--------+ ICA Distal                Occluded                 +----------+--------+--------+--------+--------+--------+ ECA       88      14                               +----------+--------+--------+--------+--------+--------+ Portions of this table do not appear on this page. +----------+--------+-------+--------+------------+           PSV cm/sEDV cmsDescribeArm Pressure +----------+--------+-------+--------+------------+ Subclavian187                                 +----------+--------+-------+--------+------------+ +---------+--------+--------+--------+ VertebralPSV cm/sEDV cm/soccluded +---------+--------+--------+--------+ Left Carotid Findings: +----------+--------+--------+--------+--------+------------------+           PSV cm/sEDV cm/sStenosisDescribeComments           +----------+--------+--------+--------+--------+------------------+ CCA Prox  98      29                      intimal thickening +----------+--------+--------+--------+--------+------------------+ CCA Distal105     32                      intimal thickening +----------+--------+--------+--------+--------+------------------+ ICA Prox  165     64      40-59%  calcific                   +----------+--------+--------+--------+--------+------------------+ ICA Distal100     45                                          +----------+--------+--------+--------+--------+------------------+ ECA       132     24                                         +----------+--------+--------+--------+--------+------------------+ +----------+--------+--------+--------+------------+  SubclavianPSV cm/sEDV cm/sDescribeArm Pressure +----------+--------+--------+--------+------------+           51                                   +----------+--------+--------+--------+------------+ +---------+--------+--+--------+--+----------+ VertebralPSV cm/s66EDV cm/s15Retrograde +---------+--------+--+--------+--+----------+  ABI Findings: +--------+------------------+-----+---------+--------+ Right   Rt Pressure (mmHg)IndexWaveform Comment  +--------+------------------+-----+---------+--------+ Brachial                       triphasicPICC     +--------+------------------+-----+---------+--------+ PTA     95                0.75 biphasic          +--------+------------------+-----+---------+--------+ DP      92                0.73 biphasic          +--------+------------------+-----+---------+--------+ +--------+------------------+-----+---------+-------+ Left    Lt Pressure (mmHg)IndexWaveform Comment +--------+------------------+-----+---------+-------+ [id number]                    triphasic        +--------+------------------+-----+---------+-------+ PTA     127               1.01 biphasic         +--------+------------------+-----+---------+-------+ DP      126               1.00 biphasic         +--------+------------------+-----+---------+-------+  Right Doppler Findings: +--------+--------+-----+---------+--------+ Site    PressureIndexDoppler  Comments +--------+--------+-----+---------+--------+ Brachial             triphasicPICC     +--------+--------+-----+---------+--------+ Radial               triphasic         +--------+--------+-----+---------+--------+  Ulnar                triphasic         +--------+--------+-----+---------+--------+  Left Doppler Findings: +--------+--------+-----+---------+--------+ Site    PressureIndexDoppler  Comments +--------+--------+-----+---------+--------+ [id number]          triphasic         +--------+--------+-----+---------+--------+ Radial               triphasic         +--------+--------+-----+---------+--------+ Ulnar                triphasic         +--------+--------+-----+---------+--------+  Summary: Right Carotid: Evidence consistent with a total occlusion of the right ICA. Left Carotid: Velocities in the left ICA are consistent with a 40-59% stenosis. Vertebrals:  Left vertebral artery demonstrates retrograde flow. Right vertebral              artery demonstrates an occlusion. Subclavians: Normal flow hemodynamics were seen in bilateral subclavian              arteries. Right ABI: Resting right ankle-brachial index indicates moderate right lower extremity arterial disease. Left ABI: Resting left ankle-brachial index is within normal range. No evidence of significant left lower extremity arterial disease. Right Upper Extremity: Doppler waveforms remain within normal limits with right radial compression. Doppler waveforms decrease >50% with right ulnar compression. Left Upper Extremity: Doppler waveforms remain within normal limits with left radial compression. Doppler waveforms remain within normal limits with left ulnar compression.  Electronically signed by Lemar LivingsBrandon Cain MD on 01/26/2020 at 7:08:37 PM.  Final     Scheduled Meds: . [START ON 01/28/2020] acetaminophen  1,000 mg Oral Q6H   Or  . [START ON 01/28/2020] acetaminophen (TYLENOL) oral liquid 160 mg/5 mL  1,000 mg Per Tube Q6H  . [START ON 01/28/2020] aspirin EC  325 mg Oral Daily   Or  . [START ON 01/28/2020] aspirin  324 mg Per Tube Daily  . atorvastatin  80 mg Oral Daily  . [START ON 01/28/2020] bisacodyl  10 mg Oral Daily     Or  . [START ON 01/28/2020] bisacodyl  10 mg Rectal Daily  . Chlorhexidine Gluconate Cloth  6 each Topical Daily  . Chlorhexidine Gluconate Cloth  6 each Topical Q0600  . [START ON 01/28/2020] Chlorhexidine Gluconate Cloth  6 each Topical Q0600  . [START ON 01/28/2020] docusate sodium  200 mg Oral Daily  . DULoxetine  60 mg Oral Daily  . metoprolol tartrate  12.5 mg Oral BID   Or  . metoprolol tartrate  12.5 mg Per Tube BID  . mirtazapine  7.5 mg Oral QHS  . mupirocin ointment  1 application Nasal BID  . [START ON 01/29/2020] pantoprazole  40 mg Oral Daily  . primidone  50 mg Oral QHS  . sodium chloride flush  10-40 mL Intracatheter Q12H  . sodium chloride flush  3 mL Intravenous Q12H  . [START ON 01/28/2020] sodium chloride flush  3 mL Intravenous Q12H  . tamsulosin  0.4 mg Oral QPC supper   Continuous Infusions: . sodium chloride Stopped (01/27/20 1741)  . sodium chloride Stopped (01/24/20 1335)  . sodium chloride    . [START ON 01/28/2020] sodium chloride    . sodium chloride 10 mL/hr at 01/27/20 1603  . albumin human 12.5 g (01/27/20 1727)  . cefUROXime (ZINACEF)  IV 200 mL/hr at 01/27/20 1800  . dexmedetomidine (PRECEDEX) IV infusion Stopped (01/27/20 1747)  . epinephrine 5 mcg/min (01/27/20 1800)  . famotidine (PEPCID) IV Stopped (01/27/20 1612)  . insulin 1.5 mL/hr at 01/27/20 1800  . lactated ringers    . lactated ringers 20 mL/hr at 01/27/20 1800  . lactated ringers 10 mL/hr at 01/27/20 1800  . magnesium sulfate 20 mL/hr at 01/27/20 1800  . milrinone 0.25 mcg/kg/min (01/27/20 1800)  . nitroGLYCERIN Stopped (01/27/20 1556)  . norepinephrine (LEVOPHED) Adult infusion 8 mcg/min (01/27/20 1800)  . phenylephrine (NEO-SYNEPHRINE) Adult infusion Stopped (01/27/20 1551)  . potassium chloride    . vancomycin    . vasopressin 0.03 Units/min (01/27/20 1800)     LOS: 5 days   Rickey Barbara, MD [facility] Pager On Amion  If 7PM-7AM, please contact  night-coverage 01/27/2020, 6:04 PM

## 2020-01-27 NOTE — Anesthesia Procedure Notes (Signed)
Central Venous Catheter Insertion Performed by: Annye Asa, MD, anesthesiologist Start/End9/24/2021 6:31 AM, 01/27/2020 6:44 AM Patient location: Pre-op. Preanesthetic checklist: patient identified, IV checked, risks and benefits discussed, surgical consent, monitors and equipment checked, pre-op evaluation, timeout performed and anesthesia consent Position: supine Lidocaine 1% used for infiltration and patient sedated Hand hygiene performed , maximum sterile barriers used  and Seldinger technique used Catheter size: 9 Fr PA cath was placed.MAC introducer Swan type:thermodilution Procedure performed using ultrasound guided technique. Ultrasound Notes:anatomy identified, needle tip was noted to be adjacent to the nerve/plexus identified, no ultrasound evidence of intravascular and/or intraneural injection and image(s) printed for medical record Attempts: 1 Following insertion, line sutured, dressing applied and Biopatch. Post procedure assessment: blood return through all ports, free fluid flow and no air  Patient tolerated the procedure well with no immediate complications. Additional procedure comments: PA catheter:  Routine monitors. Timeout, sterile prep, drape, FBP R neck. Supine position.  1% Lido local, finder and trocar RIJ 1st pass with US guidance.  MAC placed over J wire. PA catheter in easily.  Sterile dressing applied.  Patient tolerated well, VSS.  Jenita Seashore, MD.

## 2020-01-27 NOTE — Anesthesia Procedure Notes (Signed)
Procedure Name: Intubation Date/Time: 01/27/2020 8:04 AM Performed by: Valda Favia, CRNA Pre-anesthesia Checklist: Patient identified, Emergency Drugs available, Suction available and Patient being monitored Patient Re-evaluated:Patient Re-evaluated prior to induction Oxygen Delivery Method: Circle System Utilized Preoxygenation: Pre-oxygenation with 100% oxygen Induction Type: IV induction Ventilation: Mask ventilation without difficulty and Oral airway inserted - appropriate to patient size Laryngoscope Size: Mac and 4 Grade View: Grade II Tube type: Oral Tube size: 8.0 mm Number of attempts: 1 Airway Equipment and Method: Stylet and Oral airway Placement Confirmation: ETT inserted through vocal cords under direct vision,  positive ETCO2 and breath sounds checked- equal and bilateral Secured at: 21 cm Tube secured with: Tape Dental Injury: Teeth and Oropharynx as per pre-operative assessment

## 2020-01-27 NOTE — Transfer of Care (Signed)
Immediate Anesthesia Transfer of Care Note  Patient: Douglas Carr  Procedure(s) Performed: CORONARY ARTERY BYPASS GRAFTING (CABG), ON PUMP, TIMES FIVE, USING BILATERAL MAMMARY ARTERIES, LEFT RADIAL ARTERY, AND ENDOSCOPICALLY HARVESTED RIGHT GREATER SAPHENOUS VEIN. (N/A Chest) RADIAL ARTERY HARVEST (Left Arm Lower) TRANSESOPHAGEAL ECHOCARDIOGRAM (TEE) (N/A ) INDOCYANINE GREEN FLUORESCENCE IMAGING (ICG) (N/A ) ENDOVEIN HARVEST OF GREATER SAPHENOUS VEIN (Right Leg Upper) CLIPPING OF ATRIAL APPENDAGE USING ATRICURE 35 MM ATRICLIP (Left Chest)  Patient Location: ICU  Anesthesia Type:General  Level of Consciousness: sedated and Patient remains intubated per anesthesia plan  Airway & Oxygen Therapy: Patient remains intubated per anesthesia plan and Patient placed on Ventilator (see vital sign flow sheet for setting)  Post-op Assessment: Report given to RN and Post -op Vital signs reviewed and stable  Post vital signs: Reviewed and stable  Last Vitals:  Vitals Value Taken Time  BP    Temp    Pulse    Resp    SpO2      Last Pain:  Vitals:   01/27/20 0513  TempSrc: Oral  PainSc:       Patients Stated Pain Goal: 0 (01/25/20 1600)  Complications: No complications documented.

## 2020-01-27 NOTE — Significant Event (Signed)
Patient tolerated emergent reintubation by anesthesia team at bedside of 2H22. X-ray pending. Dr. Vickey Sages made aware when he rounded on patient. Vital signs stable throughout procedure.    Wallis Bamberg, RN

## 2020-01-27 NOTE — Anesthesia Postprocedure Evaluation (Signed)
Anesthesia Post Note  Patient: Douglas Carr  Procedure(s) Performed: CORONARY ARTERY BYPASS GRAFTING (CABG), ON PUMP, TIMES FIVE, USING BILATERAL MAMMARY ARTERIES, LEFT RADIAL ARTERY, AND ENDOSCOPICALLY HARVESTED RIGHT GREATER SAPHENOUS VEIN. (N/A Chest) RADIAL ARTERY HARVEST (Left Arm Lower) TRANSESOPHAGEAL ECHOCARDIOGRAM (TEE) (N/A ) INDOCYANINE GREEN FLUORESCENCE IMAGING (ICG) (N/A ) ENDOVEIN HARVEST OF GREATER SAPHENOUS VEIN (Right Leg Upper) CLIPPING OF ATRIAL APPENDAGE USING ATRICURE 35 MM ATRICLIP (Left Chest)     Patient location during evaluation: SICU Anesthesia Type: General Level of consciousness: sedated Pain management: pain level controlled Vital Signs Assessment: post-procedure vital signs reviewed and stable Respiratory status: patient remains intubated per anesthesia plan Cardiovascular status: stable Postop Assessment: no apparent nausea or vomiting Anesthetic complications: no   No complications documented.  Last Vitals:  Vitals:   01/27/20 0513 01/27/20 1523  BP: 106/66   Pulse: 76 (!) 107  Resp: 13 (!) 21  Temp: 36.5 C   SpO2: 94% 94%    Last Pain:  Vitals:   01/27/20 0513  TempSrc: Oral  PainSc:                  Hiba Garry,W. EDMOND

## 2020-01-27 NOTE — Procedures (Signed)
Extubation Procedure Note  Patient Details:   Name: Douglas Carr DOB: 1965/06/07 MRN: [id number]   Airway Documentation:  Airway 8 mm (Active)  Secured at (cm) 25 cm 01/27/20 1915  Measured From Lips 01/27/20 1915  Secured Location Right 01/27/20 1915  Secured By [employer] 01/27/20 1915  Site Condition Dry 01/27/20 1915   Vent end date: (not recorded) Vent end time: (not recorded)   Evaluation  O2 sats: stable throughout Complications: No apparent complications Patient did tolerate procedure well. Bilateral Breath Sounds: Clear, Diminished   Yes  Pt. Extubated to 4L [place] , VS stable. Pt. Able to say name. Cuff leak checked. Breath sounds clear, no stridor noted.  VC 1200, nif -25  Milta Deiters 01/27/2020, 8:34 PM

## 2020-01-27 NOTE — Anesthesia Procedure Notes (Signed)
Arterial Line Insertion Performed by: Kalen Ratajczak B, CRNA, CRNA  Patient location: Pre-op. Preanesthetic checklist: patient identified, IV checked, site marked, risks and benefits discussed, surgical consent, monitors and equipment checked, pre-op evaluation, timeout performed and anesthesia consent Lidocaine 1% used for infiltration and patient sedated Right, radial was placed Catheter size: 20 G Hand hygiene performed , maximum sterile barriers used  and Seldinger technique used Allen's test indicative of satisfactory collateral circulation Attempts: 1 Procedure performed without using ultrasound guided technique. Following insertion, dressing applied and Biopatch. Post procedure assessment: normal  Patient tolerated the procedure well with no immediate complications.     

## 2020-01-27 NOTE — Progress Notes (Signed)
CT surgery p.m. Rounds   post0p CABG for ischemic cardiomyopathy Vent wean in progress Hemodynamics stable on low-dose inotropic support which he will require for the next 24 to 48 hours Chest tube output satisfactory Postop hematocrit 35 creatinine 1.0

## 2020-01-27 NOTE — Brief Op Note (Signed)
01/22/2020 - 01/27/2020  1:32 PM  PATIENT:  Douglas Carr  54 y.o. male  PRE-OPERATIVE DIAGNOSIS:  CORONARY ARTERY DISEASE CONGESTIVE HEART FAILURE  POST-OPERATIVE DIAGNOSIS:  CORONARY ARTERY DISEASE CONGESTIVE HEART FAILURE  PROCEDURE:  Procedure(s) with comments: CORONARY ARTERY BYPASS GRAFTING (CABG) TIMES FIVE   LIMA->LAD  RIMA->D1 (free graft)  LRA-> OM  SVG-> PDA->PL  TRANSESOPHAGEAL ECHOCARDIOGRAM (TEE)  INDOCYANINE GREEN FLUORESCENCE IMAGING   ENDOVEIN HARVEST OF GREATER SAPHENOUS VEIN  Vein harvest time:  25 min Vein prep time: 20 min LEFT RADIAL ARTERY HARVEST LRA harvest time:  LRA prep time:  CLIPPING OF ATRIAL APPENDAGE USING ATRICURE 35 MM ATRICLIP   SURGEON:  Linden Dolin, MD - Primary  PHYSICIAN ASSISTANT: Lexii Walsh  ANESTHESIA:   general  EBL:  Per anesthesia and perfusion records   BLOOD ADMINISTERED:none  DRAINS: Bilateral pleural and mediastinal drains   LOCAL MEDICATIONS USED:  Bilateral intercostal Exparel  SPECIMEN:  No Specimen  DISPOSITION OF SPECIMEN:  N/A  COUNTS:  YES  DICTATION: .Dragon Dictation  PLAN OF CARE: Admit to inpatient   PATIENT DISPOSITION:  ICU - intubated and hemodynamically stable.   Delay start of Pharmacological VTE agent (>24hrs) due to surgical blood loss or risk of bleeding: yes

## 2020-01-28 ENCOUNTER — Inpatient Hospital Stay (HOSPITAL_COMMUNITY): Payer: Medicare Other

## 2020-01-28 LAB — CBC
HCT: 30.5 % — ABNORMAL LOW (ref 39.0–52.0)
HCT: 29.2 % — ABNORMAL LOW (ref 39.0–52.0)
Hemoglobin: 10 g/dL — ABNORMAL LOW (ref 13.0–17.0)
Hemoglobin: 9.1 g/dL — ABNORMAL LOW (ref 13.0–17.0)
MCH: 29 pg (ref 26.0–34.0)
MCH: 28.2 pg (ref 26.0–34.0)
MCHC: 32.8 g/dL (ref 30.0–36.0)
MCHC: 31.2 g/dL (ref 30.0–36.0)
MCV: 88.4 fL (ref 80.0–100.0)
MCV: 90.4 fL (ref 80.0–100.0)
Platelets: 213 10*3/uL (ref 150–400)
Platelets: 183 10*3/uL (ref 150–400)
RBC: 3.45 MIL/uL — ABNORMAL LOW (ref 4.22–5.81)
RBC: 3.23 MIL/uL — ABNORMAL LOW (ref 4.22–5.81)
RDW: 13.1 % (ref 11.5–15.5)
RDW: 13.2 % (ref 11.5–15.5)
WBC: 15.3 10*3/uL — ABNORMAL HIGH (ref 4.0–10.5)
WBC: 20.1 10*3/uL — ABNORMAL HIGH (ref 4.0–10.5)
nRBC: 0 % (ref 0.0–0.2)
nRBC: 0 % (ref 0.0–0.2)

## 2020-01-28 LAB — GLUCOSE, CAPILLARY
Glucose-Capillary: 104 mg/dL — ABNORMAL HIGH (ref 70–99)
Glucose-Capillary: 108 mg/dL — ABNORMAL HIGH (ref 70–99)
Glucose-Capillary: 120 mg/dL — ABNORMAL HIGH (ref 70–99)
Glucose-Capillary: 122 mg/dL — ABNORMAL HIGH (ref 70–99)
Glucose-Capillary: 131 mg/dL — ABNORMAL HIGH (ref 70–99)
Glucose-Capillary: 133 mg/dL — ABNORMAL HIGH (ref 70–99)
Glucose-Capillary: 151 mg/dL — ABNORMAL HIGH (ref 70–99)
Glucose-Capillary: 156 mg/dL — ABNORMAL HIGH (ref 70–99)
Glucose-Capillary: 161 mg/dL — ABNORMAL HIGH (ref 70–99)
Glucose-Capillary: 169 mg/dL — ABNORMAL HIGH (ref 70–99)
Glucose-Capillary: 175 mg/dL — ABNORMAL HIGH (ref 70–99)
Glucose-Capillary: 230 mg/dL — ABNORMAL HIGH (ref 70–99)
Glucose-Capillary: 243 mg/dL — ABNORMAL HIGH (ref 70–99)

## 2020-01-28 LAB — BASIC METABOLIC PANEL
Anion gap: 11 (ref 5–15)
Anion gap: 6 (ref 5–15)
BUN: 21 mg/dL — ABNORMAL HIGH (ref 6–20)
BUN: 28 mg/dL — ABNORMAL HIGH (ref 6–20)
CO2: 22 mmol/L (ref 22–32)
CO2: 24 mmol/L (ref 22–32)
Calcium: 7.6 mg/dL — ABNORMAL LOW (ref 8.9–10.3)
Calcium: 7.6 mg/dL — ABNORMAL LOW (ref 8.9–10.3)
Chloride: 104 mmol/L (ref 98–111)
Chloride: 100 mmol/L (ref 98–111)
Creatinine, Ser: 1.67 mg/dL — ABNORMAL HIGH (ref 0.61–1.24)
Creatinine, Ser: 2.14 mg/dL — ABNORMAL HIGH (ref 0.61–1.24)
GFR calc Af Amer: 53 mL/min — ABNORMAL LOW (ref >60)
GFR calc Af Amer: 40 mL/min — ABNORMAL LOW (ref >60)
GFR calc non Af Amer: 46 mL/min — ABNORMAL LOW (ref >60)
GFR calc non Af Amer: 34 mL/min — ABNORMAL LOW (ref >60)
Glucose, Bld: 150 mg/dL — ABNORMAL HIGH (ref 70–99)
Glucose, Bld: 242 mg/dL — ABNORMAL HIGH (ref 70–99)
Potassium: 4.7 mmol/L (ref 3.5–5.1)
Potassium: 6.4 mmol/L (ref 3.5–5.1)
Sodium: 137 mmol/L (ref 135–145)
Sodium: 130 mmol/L — ABNORMAL LOW (ref 135–145)

## 2020-01-28 LAB — PREPARE PLATELET PHERESIS: Unit division: 0

## 2020-01-28 LAB — COOXEMETRY PANEL
Carboxyhemoglobin: 1.8 % — ABNORMAL HIGH (ref 0.5–1.5)
Methemoglobin: 1.2 % (ref 0.0–1.5)
O2 Saturation: 57.5 %
Total hemoglobin: 9.9 g/dL — ABNORMAL LOW (ref 12.0–16.0)

## 2020-01-28 LAB — BPAM PLATELET PHERESIS
Blood Product Expiration Date: 202109272359
ISSUE DATE / TIME: 202109241400
Unit Type and Rh: 6200

## 2020-01-28 LAB — MAGNESIUM
Magnesium: 2.8 mg/dL — ABNORMAL HIGH (ref 1.7–2.4)
Magnesium: 2.5 mg/dL — ABNORMAL HIGH (ref 1.7–2.4)

## 2020-01-28 MED ORDER — CHLORHEXIDINE GLUCONATE CLOTH 2 % EX PADS
6.0000 | MEDICATED_PAD | Freq: Every day | CUTANEOUS | Status: DC
  Administered 2020-01-28 – 2020-02-03 (×6): 6 via TOPICAL

## 2020-01-28 MED ORDER — ISOSORBIDE DINITRATE 10 MG PO TABS
5.0000 mg | ORAL_TABLET | Freq: Two times a day (BID) | ORAL | Status: DC
  Administered 2020-01-28 – 2020-01-30 (×5): 5 mg via ORAL
  Filled 2020-01-28 (×6): qty 1

## 2020-01-28 MED ORDER — EPINEPHRINE HCL 5 MG/250ML IV SOLN IN NS
INTRAVENOUS | Status: AC
  Administered 2020-01-28: 3 ug/min via INTRAVENOUS
  Filled 2020-01-28: qty 250

## 2020-01-28 MED ORDER — FUROSEMIDE 10 MG/ML IJ SOLN
20.0000 mg | Freq: Two times a day (BID) | INTRAMUSCULAR | Status: DC
  Administered 2020-01-28 (×2): 20 mg via INTRAVENOUS
  Filled 2020-01-28 (×3): qty 2

## 2020-01-28 MED ORDER — EPINEPHRINE HCL 5 MG/250ML IV SOLN IN NS
3.0000 ug/min | INTRAVENOUS | Status: DC
  Administered 2020-01-29: 3 ug/min via INTRAVENOUS
  Filled 2020-01-28: qty 250

## 2020-01-28 MED ORDER — CARVEDILOL 3.125 MG PO TABS
3.1250 mg | ORAL_TABLET | Freq: Two times a day (BID) | ORAL | Status: DC
  Administered 2020-01-30 – 2020-02-04 (×10): 3.125 mg via ORAL
  Filled 2020-01-28 (×11): qty 1

## 2020-01-28 MED ORDER — EPINEPHRINE HCL 5 MG/250ML IV SOLN IN NS
3.0000 ug/min | INTRAVENOUS | Status: AC
  Administered 2020-01-28: 3 ug/min via INTRAVENOUS

## 2020-01-28 MED ORDER — INSULIN ASPART 100 UNIT/ML ~~LOC~~ SOLN
0.0000 [IU] | SUBCUTANEOUS | Status: DC
  Administered 2020-01-28: 2 [IU] via SUBCUTANEOUS
  Administered 2020-01-28 – 2020-01-29 (×2): 8 [IU] via SUBCUTANEOUS
  Administered 2020-01-29: 2 [IU] via SUBCUTANEOUS
  Administered 2020-01-29 (×3): 8 [IU] via SUBCUTANEOUS
  Administered 2020-01-29: 4 [IU] via SUBCUTANEOUS
  Administered 2020-01-30: 2 [IU] via SUBCUTANEOUS
  Administered 2020-01-30 (×2): 4 [IU] via SUBCUTANEOUS

## 2020-01-28 MED ORDER — INSULIN GLARGINE 100 UNIT/ML ~~LOC~~ SOLN
12.0000 [IU] | Freq: Two times a day (BID) | SUBCUTANEOUS | Status: DC
  Administered 2020-01-28 (×2): 12 [IU] via SUBCUTANEOUS
  Filled 2020-01-28 (×4): qty 0.12

## 2020-01-28 MED ORDER — SODIUM ZIRCONIUM CYCLOSILICATE 10 G PO PACK
10.0000 g | PACK | Freq: Once | ORAL | Status: AC
  Administered 2020-01-28: 10 g via ORAL
  Filled 2020-01-28: qty 1

## 2020-01-28 NOTE — Progress Notes (Signed)
1 Day Post-Op Procedure(s) (LRB): CORONARY ARTERY BYPASS GRAFTING (CABG), ON PUMP, TIMES FIVE, USING BILATERAL MAMMARY ARTERIES, LEFT RADIAL ARTERY, AND ENDOSCOPICALLY HARVESTED RIGHT GREATER SAPHENOUS VEIN. (N/A) RADIAL ARTERY HARVEST (Left) TRANSESOPHAGEAL ECHOCARDIOGRAM (TEE) (N/A) INDOCYANINE GREEN FLUORESCENCE IMAGING (ICG) (N/A) ENDOVEIN HARVEST OF GREATER SAPHENOUS VEIN (Right) CLIPPING OF ATRIAL APPENDAGE USING ATRICURE 35 MM ATRICLIP (Left) Subjective: Extubated, oriented  Objective: Vital signs in last 24 hours: Temp:  [96.1 F (35.6 C)-99.1 F (37.3 C)] 96.1 F (35.6 C) (09/25 0900) Pulse Rate:  [86-107] 88 (09/25 0900) Cardiac Rhythm: Normal sinus rhythm (09/24 2345) Resp:  [6-31] 6 (09/25 0900) BP: (81-163)/(48-97) 133/51 (09/25 0900) SpO2:  [90 %-100 %] 97 % (09/25 0900) Arterial Line BP: (56-127)/(44-78) 79/71 (09/25 0900) FiO2 (%):  [40 %-50 %] 40 % (09/24 1930) Weight:  [95.8 kg] 95.8 kg (09/25 0500)  Hemodynamic parameters for last 24 hours: PAP: (15-45)/(2-31) 21/11 CVP:  [5 mmHg-14 mmHg] 8 mmHg CO:  [4.5 L/min-5.6 L/min] 5.6 L/min CI:  [2.1 L/min/m2-2.6 L/min/m2] 2.6 L/min/m2  Intake/Output from previous day: 09/24 0701 - 09/25 0700 In: 6544.2 [I.V.:3674.5; Blood:902; IV Piggyback:1967.7] Out: 3652 [Urine:1185; Emesis/NG output:110; Blood:1267; Chest Tube:1090] Intake/Output this shift: Total I/O In: 82.2 [I.V.:78.2; IV Piggyback:3.9] Out: -   Neuro stable- old CVA nsr Lab Results: Recent Labs    01/27/20 2103 01/27/20 2120 01/27/20 2211 01/28/20 0314  WBC 18.5*  --   --  15.3*  HGB 10.2*   < > 10.5* 10.0*  HCT 32.3*   < > 31.0* 30.5*  PLT 225  --   --  213   < > = values in this interval not displayed.   BMET:  Recent Labs    01/27/20 2103 01/27/20 2120 01/27/20 2211 01/28/20 0314  NA 139   < > 139 137  K 4.9   < > 5.0 4.7  CL 107  --   --  104  CO2 23  --   --  22  GLUCOSE 184*  --   --  150*  BUN 19  --   --  21*  CREATININE  1.50*  --   --  1.67*  CALCIUM 7.5*  --   --  7.6*   < > = values in this interval not displayed.    PT/INR:  Recent Labs    01/27/20 1503  LABPROT 17.3*  INR 1.5*   ABG    Component Value Date/Time   PHART 7.333 (L) 01/27/2020 2211   HCO3 23.8 01/27/2020 2211   TCO2 25 01/27/2020 2211   ACIDBASEDEF 2.0 01/27/2020 2211   O2SAT 57.5 01/28/2020 0340   CBG (last 3)  Recent Labs    01/28/20 0445 01/28/20 0620 01/28/20 0727  GLUCAP 133* 120* 131*    Assessment/Plan: S/P Procedure(s) (LRB): CORONARY ARTERY BYPASS GRAFTING (CABG), ON PUMP, TIMES FIVE, USING BILATERAL MAMMARY ARTERIES, LEFT RADIAL ARTERY, AND ENDOSCOPICALLY HARVESTED RIGHT GREATER SAPHENOUS VEIN. (N/A) RADIAL ARTERY HARVEST (Left) TRANSESOPHAGEAL ECHOCARDIOGRAM (TEE) (N/A) INDOCYANINE GREEN FLUORESCENCE IMAGING (ICG) (N/A) ENDOVEIN HARVEST OF GREATER SAPHENOUS VEIN (Right) CLIPPING OF ATRIAL APPENDAGE USING ATRICURE 35 MM ATRICLIP (Left) Mobilize Diuresis Diabetes control See progression orders   LOS: 6 days    Douglas Carr 01/28/2020

## 2020-01-28 NOTE — Progress Notes (Signed)
CT surgery p.m. 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 labs are pending Blood pressure 121/69, pulse 99, temperature 97.7 F (36.5 C), resp. rate 14, height 5\' 10"  (1.778 m), weight 95.8 kg, SpO2 99 %.

## 2020-01-28 NOTE — Plan of Care (Signed)
  Problem: Education: Goal: Understanding of CV disease, CV risk reduction, and recovery process will improve Outcome: Progressing Goal: Individualized Educational Video(s) Outcome: Progressing   Problem: Activity: Goal: Ability to return to baseline activity level will improve Outcome: Progressing   Problem: Cardiovascular: Goal: Ability to achieve and maintain adequate cardiovascular perfusion will improve Outcome: Progressing Goal: Vascular access site(s) Level 0-1 will be maintained Outcome: Progressing   Problem: Health Behavior/Discharge Planning: Goal: Ability to safely manage health-related needs after discharge will improve Outcome: Progressing   Problem: Education: Goal: Knowledge of General Education information will improve Description: Including pain rating scale, medication(s)/side effects and non-pharmacologic comfort measures Outcome: Progressing   Problem: Health Behavior/Discharge Planning: Goal: Ability to manage health-related needs will improve Outcome: Progressing   Problem: Clinical Measurements: Goal: Ability to maintain clinical measurements within normal limits will improve Outcome: Progressing Goal: Will remain free from infection Outcome: Progressing Goal: Diagnostic test results will improve Outcome: Progressing Goal: Respiratory complications will improve Outcome: Progressing Goal: Cardiovascular complication will be avoided Outcome: Progressing   Problem: Activity: Goal: Risk for activity intolerance will decrease Outcome: Progressing   Problem: Nutrition: Goal: Adequate nutrition will be maintained Outcome: Progressing   Problem: Coping: Goal: Level of anxiety will decrease Outcome: Progressing   Problem: Elimination: Goal: Will not experience complications related to bowel motility Outcome: Progressing Goal: Will not experience complications related to urinary retention Outcome: Progressing   Problem: Pain Managment: Goal:  General experience of comfort will improve Outcome: Progressing   Problem: Safety: Goal: Ability to remain free from injury will improve Outcome: Progressing   Problem: Skin Integrity: Goal: Risk for impaired skin integrity will decrease Outcome: Progressing   Problem: Education: Goal: Ability to demonstrate management of disease process will improve Outcome: Progressing Goal: Ability to verbalize understanding of medication therapies will improve Outcome: Progressing Goal: Individualized Educational Video(s) Outcome: Progressing   Problem: Activity: Goal: Capacity to carry out activities will improve Outcome: Progressing   Problem: Cardiac: Goal: Ability to achieve and maintain adequate cardiopulmonary perfusion will improve Outcome: Progressing   Problem: Education: Goal: Will demonstrate proper wound care and an understanding of methods to prevent future damage Outcome: Progressing Goal: Knowledge of disease or condition will improve Outcome: Progressing Goal: Knowledge of the prescribed therapeutic regimen will improve Outcome: Progressing   Problem: Activity: Goal: Risk for activity intolerance will decrease Outcome: Progressing   Problem: Cardiac: Goal: Will achieve and/or maintain hemodynamic stability Outcome: Progressing   Problem: Clinical Measurements: Goal: Postoperative complications will be avoided or minimized Outcome: Progressing   Problem: Respiratory: Goal: Respiratory status will improve Outcome: Progressing   Problem: Skin Integrity: Goal: Wound healing without signs and symptoms of infection Outcome: Progressing Goal: Risk for impaired skin integrity will decrease Outcome: Progressing   Problem: Urinary Elimination: Goal: Ability to achieve and maintain adequate renal perfusion and functioning will improve Outcome: Progressing   

## 2020-01-29 ENCOUNTER — Inpatient Hospital Stay (HOSPITAL_COMMUNITY): Payer: Medicare Other

## 2020-01-29 LAB — GLUCOSE, CAPILLARY
Glucose-Capillary: 127 mg/dL — ABNORMAL HIGH (ref 70–99)
Glucose-Capillary: 159 mg/dL — ABNORMAL HIGH (ref 70–99)
Glucose-Capillary: 170 mg/dL — ABNORMAL HIGH (ref 70–99)
Glucose-Capillary: 229 mg/dL — ABNORMAL HIGH (ref 70–99)
Glucose-Capillary: 239 mg/dL — ABNORMAL HIGH (ref 70–99)
Glucose-Capillary: 240 mg/dL — ABNORMAL HIGH (ref 70–99)
Glucose-Capillary: 250 mg/dL — ABNORMAL HIGH (ref 70–99)

## 2020-01-29 LAB — BASIC METABOLIC PANEL
Anion gap: 9 (ref 5–15)
Anion gap: 8 (ref 5–15)
BUN: 32 mg/dL — ABNORMAL HIGH (ref 6–20)
BUN: 35 mg/dL — ABNORMAL HIGH (ref 6–20)
CO2: 22 mmol/L (ref 22–32)
CO2: 24 mmol/L (ref 22–32)
Calcium: 7.6 mg/dL — ABNORMAL LOW (ref 8.9–10.3)
Calcium: 7.5 mg/dL — ABNORMAL LOW (ref 8.9–10.3)
Chloride: 100 mmol/L (ref 98–111)
Chloride: 98 mmol/L (ref 98–111)
Creatinine, Ser: 2.33 mg/dL — ABNORMAL HIGH (ref 0.61–1.24)
Creatinine, Ser: 2.27 mg/dL — ABNORMAL HIGH (ref 0.61–1.24)
GFR calc Af Amer: 36 mL/min — ABNORMAL LOW (ref >60)
GFR calc Af Amer: 37 mL/min — ABNORMAL LOW (ref >60)
GFR calc non Af Amer: 31 mL/min — ABNORMAL LOW (ref >60)
GFR calc non Af Amer: 32 mL/min — ABNORMAL LOW (ref >60)
Glucose, Bld: 270 mg/dL — ABNORMAL HIGH (ref 70–99)
Glucose, Bld: 186 mg/dL — ABNORMAL HIGH (ref 70–99)
Potassium: 5.6 mmol/L — ABNORMAL HIGH (ref 3.5–5.1)
Potassium: 4.4 mmol/L (ref 3.5–5.1)
Sodium: 131 mmol/L — ABNORMAL LOW (ref 135–145)
Sodium: 130 mmol/L — ABNORMAL LOW (ref 135–145)

## 2020-01-29 LAB — COOXEMETRY PANEL
Carboxyhemoglobin: 1.9 % — ABNORMAL HIGH (ref 0.5–1.5)
Methemoglobin: 1.3 % (ref 0.0–1.5)
O2 Saturation: 67.9 %
Total hemoglobin: 8.5 g/dL — ABNORMAL LOW (ref 12.0–16.0)

## 2020-01-29 LAB — CBC
HCT: 26.4 % — ABNORMAL LOW (ref 39.0–52.0)
Hemoglobin: 8.3 g/dL — ABNORMAL LOW (ref 13.0–17.0)
MCH: 28.5 pg (ref 26.0–34.0)
MCHC: 31.4 g/dL (ref 30.0–36.0)
MCV: 90.7 fL (ref 80.0–100.0)
Platelets: 146 10*3/uL — ABNORMAL LOW (ref 150–400)
RBC: 2.91 MIL/uL — ABNORMAL LOW (ref 4.22–5.81)
RDW: 13.1 % (ref 11.5–15.5)
WBC: 16 10*3/uL — ABNORMAL HIGH (ref 4.0–10.5)
nRBC: 0 % (ref 0.0–0.2)

## 2020-01-29 MED ORDER — NOREPINEPHRINE 4 MG/250ML-% IV SOLN
4.0000 ug/min | INTRAVENOUS | Status: DC
  Administered 2020-01-29: 4 ug/min via INTRAVENOUS
  Administered 2020-01-29: 2 ug/min via INTRAVENOUS
  Filled 2020-01-29 (×2): qty 250

## 2020-01-29 MED ORDER — ALBUMIN HUMAN 5 % IV SOLN
INTRAVENOUS | Status: AC
  Administered 2020-01-29: 12.5 g via INTRAVENOUS
  Filled 2020-01-29: qty 250

## 2020-01-29 MED ORDER — FUROSEMIDE 10 MG/ML IJ SOLN
40.0000 mg | Freq: Every day | INTRAMUSCULAR | Status: DC
  Administered 2020-01-29 – 2020-02-01 (×4): 40 mg via INTRAVENOUS
  Filled 2020-01-29 (×4): qty 4

## 2020-01-29 MED ORDER — INSULIN GLARGINE 100 UNIT/ML ~~LOC~~ SOLN
20.0000 [IU] | Freq: Two times a day (BID) | SUBCUTANEOUS | Status: DC
  Administered 2020-01-29 – 2020-01-30 (×4): 20 [IU] via SUBCUTANEOUS
  Filled 2020-01-29 (×6): qty 0.2

## 2020-01-29 MED ORDER — ALBUMIN HUMAN 5 % IV SOLN: 12.5000 g | Freq: Once | INTRAVENOUS | Status: AC

## 2020-01-29 NOTE — Op Note (Signed)
CARDIOTHORACIC SURGERY OPERATIVE NOTE  Date of Procedure: 01/27/20 Preoperative Diagnosis: Severe 3-vessel Coronary Artery Disease with depressed LV function  Postoperative Diagnosis: Same  Procedure:    Coronary Artery Bypass Grafting x 5  Left Internal Mammary Artery to Distal Left Anterior Descending Coronary Artery, Saphenous Vein Graft to Posterior Descending Coronary Artery and right posterolateral coronary artery as a sequenced graft; left radial artery graft to  Obtuse Marginal Branch of Left Circumflex Coronary Artery, free right internal mammary artery graft to first diagonal Branch Coronary Artery; Endoscopic Vein Harvest from right thigh   Bilateral internal mammary artery harvesting Left radial artery harvesting, open Completion indocyanine green fluorescence imaging   Surgeon: B.  Lorayne Marek, MD  Assistant: Gaynelle Arabian, PA-C  Anesthesia: General  Operative Findings:  Reduced left ventricular systolic function  Good quality internal mammary artery conduits  Good quality saphenous vein conduit  Good quality left radial artery conduit  Good quality target vessels for grafting    BRIEF CLINICAL NOTE AND INDICATIONS FOR SURGERY  54 year old man originally presented with MI at the age of 81.  He has been dealing with cardiac issues since then.  He presented this time with heart failure and underwent repeat left heart catheterization demonstrating multivessel coronary artery disease and depressed ejection; he is referred for CABG.  DETAILS OF THE OPERATIVE PROCEDURE  Preparation:  The patient is brought to the operating room on the above mentioned date and central monitoring was established by the anesthesia team including placement of Swan-Ganz catheter and radial arterial line. The patient is placed in the supine position on the operating table.  Intravenous antibiotics are administered. General endotracheal anesthesia is induced uneventfully. A Foley  catheter is placed.  Baseline transesophageal echocardiogram was performed.  Findings were notable for depressed LV function, intact RV function and intact valvular function.  The patient's chest, abdomen, the left upper extremity, both groins, and both lower extremities are prepared and draped in a sterile manner. A time out procedure is performed.   Surgical Approach and Conduit Harvest:  A median sternotomy incision was performed and the left internal mammary artery is dissected from the chest wall and prepared for bypass grafting. The left internal mammary artery is notably good quality conduit. Simultaneously, open left radial artery harvest is undertaken. After removal of the radial artery, the left forearm incision is closed in layers and the arm is tucked at the side.Next, attention is turned to the right hemithorax where the right internal mammary artery is mobilized.  However, distally the artery is incorporated densely into the periosteum and is taken therefore as a free graft.  Following systemic heparinization, the left internal mammary artery was transected distally noted to have excellent flow.  The free right internal mammary artery is treated with a solution of papaverine as is the pedicled left internal mammary artery.   Extracorporeal Cardiopulmonary Bypass and Myocardial Protection:  The pericardium is opened. The ascending aorta is nondiseased in appearance. The ascending aorta and the right atrium are cannulated for cardiopulmonary bypass.  Adequate heparinization is verified.    The entire pre-bypass portion of the operation was notable for stable hemodynamics.  Cardiopulmonary bypass was begun and the surface of the heart is inspected. Distal target vessels are selected for coronary artery bypass grafting. A cardioplegia cannula is placed in the ascending aorta.  A  The patient is allowed to cool passively to 34C systemic temperature.  The aortic cross clamp is applied  and cold blood cardioplegia is delivered  initially in an antegrade fashion through the aortic root.  Iced saline slush is applied for topical hypothermia.  The initial cardioplegic arrest is rapid with early diastolic arrest.  Repeat doses of cardioplegia are administered intermittently throughout the entire cross clamp portion of the operation through the aortic root  and through subsequently placed grafts in order to maintain completely flat electrocardiogram.   Coronary Artery Bypass Grafting:   The  posterior descending branch of the right coronary artery and the posterior lateral branch of the right coronary artery were grafted using a reversed saphenous vein graft in a sequenced fashion.  At the site of distal anastomosis the target vessel was good quality and measured approximately 1.5 mm in diameter.  The obtuse marginal branch of the left circumflex coronary artery was grafted using the left radial artery graft in an end-to-side fashion.  At the site of distal anastomosis the target vessel was good quality and measured approximately 1.5 mm in diameter.  The first diagonal branch of the left anterior descending coronary artery was grafted using the free right internal mammary artery graft in an end-to-side fashion.  At the site of distal anastomosis the target vessel was good quality and measured approximately 1.5 mm in diameter.    The distal left anterior coronary artery was grafted with the left internal mammary artery in an end-to-side fashion.  At the site of distal anastomosis the target vessel was good quality and measured approximately 1.5 mm in diameter. Anastomotic patency and runoff was confirmed with indocyanine green fluorescence imaging (SPY).  All proximal vein graft anastomoses were placed directly to the ascending aorta prior to removal of the aortic cross clamp.  A hotshot dose of cardioplegia was given down the aortic root.  De-airing procedures were performed and the  aortic cross-clamp was removed.   Procedure Completion:  All proximal and distal coronary anastomoses were inspected for hemostasis and appropriate graft orientation. Epicardial pacing wires are fixed to the right ventricular outflow tract and to the right atrial appendage. The patient is rewarmed to 37C temperature. The patient is weaned and disconnected from cardiopulmonary bypass.  The patient's rhythm at separation from bypass was sinus bradycardia.  The patient was weaned from cardiopulmonary bypass with moderate inotropic support.   Followup transesophageal echocardiogram performed after separation from bypass revealed improved function compared with  the preoperative exam.  The aortic and venous cannula were removed uneventfully. Protamine was administered to reverse the anticoagulation. The mediastinum and pleural space were inspected for hemostasis and irrigated with saline solution. The mediastinum and bilateral pleural spaces were drained using fluted chest tubes placed through separate stab incisions inferiorly.  The soft tissues anterior to the aorta were reapproximated loosely. The sternum is closed with double strength sternal wire. The soft tissues anterior to the sternum were closed in multiple layers and the skin is closed with a running subcuticular skin closure.  The post-bypass portion of the operation was notable for stable rhythm and hemodynamics.     Disposition:  The patient tolerated the procedure well and is transported to the surgical intensive care in stable condition. There are no intraoperative complications. All sponge instrument and needle counts are verified correct at completion of the operation.    Brantley Fling, MD 01/29/2020 12:06 PM

## 2020-01-29 NOTE — Evaluation (Signed)
Physical Therapy Evaluation Patient Details Name: Douglas Carr MRN: [id number] DOB: 05-13-1965 Today's Date: 01/29/2020   History of Present Illness  54 y.o. male with medical history significant of ischemic cardiomyopathy, systolic heart failure, CVA with L-sided hemiparesis, type 2 diabetes mellitus, dyslipidemia, hypertension and anxiety. Presents to ED with dyspnea and chest pain over 4 day period. Admitted 01/22/20 for treatment of Acute decompensated systolic heart failure, complicated by acute cardiogenic pulmonary edema (acute hypoxic respiratory failure and NSTEMI. Pt s/p 9/21 heart catheterization, and 9/24 CABGx5  Clinical Impression  PTA pt living with wife in single story home with 4 steps to enter. Pt reports independence with ambulation utilizing RW and with ADLs and iADLs. Pt is currently limited in safe mobility by sternal precautions, long standing L LE weakness and overall generalized weakness. Pt requires maxAx2 for bed mobility and modAx2 for transfers and ambulation of 25 feet utilizing an Scientist, research (medical). PT recommending SNF level rehab today, however pt could possibly progress to home with 24 hour support. PT will continue to follow acutely.     Follow Up Recommendations SNF;Supervision/Assistance - 24 hour    Equipment Recommendations  Other (comment) (pt has necessary DME)    Recommendations for Other Services OT consult     Precautions / Restrictions Precautions Precautions: Sternal;ICD/Pacemaker Precaution Comments: ICD, bilateral chest tube, foley  Restrictions Weight Bearing Restrictions: No      Mobility  Bed Mobility Overal bed mobility: Needs Assistance Bed Mobility: Supine to Sit     Supine to sit: Max assist;+2 for physical assistance     General bed mobility comments: pt able to initiate movement of LE across bed, requires maxAx2 for trunk to upright and pad scoot of hips to EoB given sternal precaution  Transfers Overall transfer level:  Needs assistance Equipment used:  Carley Hammed walker) Transfers: Sit to/from Stand Sit to Stand: Min assist;+2 physical assistance;+2 safety/equipment;Mod assist         General transfer comment: minAx2 for power up from elevated bed surface, modAx2 from lower recliner surface, able to stand before reaching EVA walker   Ambulation/Gait Ambulation/Gait assistance: Min assist;+2 physical assistance;+2 safety/equipment;Mod assist Gait Distance (Feet): 25 Feet Assistive device:  (EVA walker) Gait Pattern/deviations: Step-through pattern;Decreased step length - right;Decreased step length - left;Decreased weight shift to left;Decreased stance time - right;Shuffle Gait velocity: slowed Gait velocity interpretation: <1.31 ft/sec, indicative of household ambulator General Gait Details: minA for physical assist of steadying with gait, modA for management of lines and close chair follow       Balance Overall balance assessment: Needs assistance Sitting-balance support: Feet supported;No upper extremity supported Sitting balance-Leahy Scale: Fair     Standing balance support: Bilateral upper extremity supported;During functional activity;No upper extremity supported Standing balance-Leahy Scale: Poor                               Pertinent Vitals/Pain Pain Assessment: 0-10 Pain Score: 8  Pain Location: chest after ambulation  Pain Descriptors / Indicators: Sore;Aching Pain Intervention(s): Limited activity within patient's tolerance;Monitored during session;Repositioned;Patient requesting pain meds-RN notified    Home Living Family/patient expects to be discharged to:: Private residence Living Arrangements: Spouse/significant other;Children Available Help at Discharge: Family;Available 24 hours/day Type of Home: House Home Access: Stairs to enter Entrance Stairs-Rails: Can reach both Entrance Stairs-Number of Steps: 4 Home Layout: One level Home Equipment: Walker - 2  wheels;Bedside commode;Grab bars - tub/shower;Wheelchair - Geophysical data processor  Prior Function Level of Independence: Independent with assistive device(s)         Comments: uses RW for mobility, independent in ADLs and iADLS     Hand Dominance   Dominant Hand: Right    Extremity/Trunk Assessment   Upper Extremity Assessment Upper Extremity Assessment: Generalized weakness    Lower Extremity Assessment Lower Extremity Assessment: LLE deficits/detail;Generalized weakness LLE Deficits / Details: L hemiparesis from prior CVA, strength grossly 3+/5       Communication   Communication: No difficulties  Cognition Arousal/Alertness: Awake/alert Behavior During Therapy: WFL for tasks assessed/performed Overall Cognitive Status: Within Functional Limits for tasks assessed                                        General Comments General comments (skin integrity, edema, etc.): at rest HR 104bpm, BP 139/45, SaO2 on 2.5L O2 via [place] 99%O2, with ambulation maxHR noted 120bpm, SaO2 on 3L O2 >90%O2    Exercises     Assessment/Plan    PT Assessment Patient needs continued PT services  PT Problem List Decreased strength;Decreased activity tolerance;Decreased balance;Decreased mobility;Decreased knowledge of precautions;Cardiopulmonary status limiting activity;Pain       PT Treatment Interventions DME instruction;Gait training;Functional mobility training;Therapeutic activities;Therapeutic exercise;Balance training;Cognitive remediation;Patient/family education    PT Goals (Current goals can be found in the Care Plan section)  Acute Rehab 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 Goal Formulation: With patient Time For Goal Achievement: 02/12/20 Potential to Achieve Goals: Good    Frequency Min 2X/week    AM-PAC PT "6 Clicks" Mobility  Outcome Measure Help needed turning from your back to your side while in a flat bed without using bedrails?:  A Little Help needed moving from lying on your back to sitting on the side of a flat bed without using bedrails?: Total Help needed moving to and from a bed to a chair (including a wheelchair)?: Total Help needed standing up from a chair using your arms (e.g., wheelchair or bedside chair)?: Total Help needed to walk in hospital room?: Total Help needed climbing 3-5 steps with a railing? : Total 6 Click Score: 8    End of Session Equipment Utilized During Treatment: Oxygen Activity Tolerance: Patient limited by fatigue Patient left: in chair;with call bell/phone within reach;with chair alarm set;with nursing/sitter in room Nurse Communication: Mobility status;Patient requests pain meds PT Visit Diagnosis: Unsteadiness on feet (R26.81);Other abnormalities of gait and mobility (R26.89);Muscle weakness (generalized) (M62.81);Difficulty in walking, not elsewhere classified (R26.2);Pain Pain - part of body:  (chest)    Time: 1610-9604 PT Time Calculation (min) (ACUTE ONLY): 43 min   Charges:   PT Evaluation $PT Eval Moderate Complexity: 1 Mod PT Treatments $Gait Training: 8-22 mins $Therapeutic Activity: 8-22 mins        Laray Rivkin B. Beverely Risen PT, DPT Acute Rehabilitation Services Pager [phone] Office [phone]   Elon Alas [facility] 01/29/2020, 3:18 PM

## 2020-01-29 NOTE — Progress Notes (Signed)
CT Surgery  Chest tubes remain for sig output  Doing well - PICC placed for iv inotrope rrequirement Blood pressure 126/62, pulse (!) 101, temperature 98.1 F (36.7 C), temperature source Oral, resp. rate (!) 6, height 5\' 10"  (1.778 m), weight 96.4 kg, SpO2 99 %.

## 2020-01-29 NOTE — Plan of Care (Signed)
  Problem: Education: Goal: Understanding of CV disease, CV risk reduction, and recovery process will improve Outcome: Progressing Goal: Individualized Educational Video(s) Outcome: Progressing   Problem: Activity: Goal: Ability to return to baseline activity level will improve Outcome: Progressing   Problem: Cardiovascular: Goal: Ability to achieve and maintain adequate cardiovascular perfusion will improve Outcome: Progressing Goal: Vascular access site(s) Level 0-1 will be maintained Outcome: Progressing   Problem: Health Behavior/Discharge Planning: Goal: Ability to safely manage health-related needs after discharge will improve Outcome: Progressing   Problem: Education: Goal: Knowledge of General Education information will improve Description: Including pain rating scale, medication(s)/side effects and non-pharmacologic comfort measures Outcome: Progressing   Problem: Health Behavior/Discharge Planning: Goal: Ability to manage health-related needs will improve Outcome: Progressing   Problem: Clinical Measurements: Goal: Ability to maintain clinical measurements within normal limits will improve Outcome: Progressing Goal: Will remain free from infection Outcome: Progressing Goal: Diagnostic test results will improve Outcome: Progressing Goal: Respiratory complications will improve Outcome: Progressing Goal: Cardiovascular complication will be avoided Outcome: Progressing   Problem: Activity: Goal: Risk for activity intolerance will decrease Outcome: Progressing   Problem: Nutrition: Goal: Adequate nutrition will be maintained Outcome: Progressing   Problem: Coping: Goal: Level of anxiety will decrease Outcome: Progressing   Problem: Elimination: Goal: Will not experience complications related to bowel motility Outcome: Progressing Goal: Will not experience complications related to urinary retention Outcome: Progressing   Problem: Pain Managment: Goal:  General experience of comfort will improve Outcome: Progressing   Problem: Safety: Goal: Ability to remain free from injury will improve Outcome: Progressing   Problem: Skin Integrity: Goal: Risk for impaired skin integrity will decrease Outcome: Progressing   Problem: Education: Goal: Ability to demonstrate management of disease process will improve Outcome: Progressing Goal: Ability to verbalize understanding of medication therapies will improve Outcome: Progressing Goal: Individualized Educational Video(s) Outcome: Progressing   Problem: Activity: Goal: Capacity to carry out activities will improve Outcome: Progressing   Problem: Cardiac: Goal: Ability to achieve and maintain adequate cardiopulmonary perfusion will improve Outcome: Progressing   Problem: Education: Goal: Will demonstrate proper wound care and an understanding of methods to prevent future damage Outcome: Progressing Goal: Knowledge of disease or condition will improve Outcome: Progressing Goal: Knowledge of the prescribed therapeutic regimen will improve Outcome: Progressing   Problem: Activity: Goal: Risk for activity intolerance will decrease Outcome: Progressing   Problem: Cardiac: Goal: Will achieve and/or maintain hemodynamic stability Outcome: Progressing   Problem: Clinical Measurements: Goal: Postoperative complications will be avoided or minimized Outcome: Progressing   Problem: Respiratory: Goal: Respiratory status will improve Outcome: Progressing   Problem: Skin Integrity: Goal: Wound healing without signs and symptoms of infection Outcome: Progressing Goal: Risk for impaired skin integrity will decrease Outcome: Progressing   Problem: Urinary Elimination: Goal: Ability to achieve and maintain adequate renal perfusion and functioning will improve Outcome: Progressing

## 2020-01-29 NOTE — Progress Notes (Signed)
CRITICAL VALUE ALERT  Critical Value:  Potassium 6.4  Date & Time Notied:  01/28/2020  Provider Notified: Dr. Donata Clay,   Orders Received/Actions taken: [facility] per pharmacy

## 2020-01-29 NOTE — Plan of Care (Signed)
  Problem: Education: Goal: Understanding of CV disease, CV risk reduction, and recovery process will improve Outcome: Progressing Goal: Individualized Educational Video(s) Outcome: Progressing   Problem: Activity: Goal: Ability to return to baseline activity level will improve Outcome: Progressing   Problem: Cardiovascular: Goal: Ability to achieve and maintain adequate cardiovascular perfusion will improve Outcome: Progressing Goal: Vascular access site(s) Level 0-1 will be maintained Outcome: Progressing   Problem: Health Behavior/Discharge Planning: Goal: Ability to safely manage health-related needs after discharge will improve Outcome: Progressing   Problem: Education: Goal: Knowledge of General Education information will improve Description: Including pain rating scale, medication(s)/side effects and non-pharmacologic comfort measures Outcome: Progressing   Problem: Health Behavior/Discharge Planning: Goal: Ability to manage health-related needs will improve Outcome: Progressing   Problem: Clinical Measurements: Goal: Ability to maintain clinical measurements within normal limits will improve Outcome: Progressing Goal: Will remain free from infection Outcome: Progressing Goal: Diagnostic test results will improve Outcome: Progressing Goal: Respiratory complications will improve Outcome: Progressing Goal: Cardiovascular complication will be avoided Outcome: Progressing   Problem: Activity: Goal: Risk for activity intolerance will decrease Outcome: Progressing   Problem: Nutrition: Goal: Adequate nutrition will be maintained Outcome: Progressing   Problem: Coping: Goal: Level of anxiety will decrease Outcome: Progressing   Problem: Elimination: Goal: Will not experience complications related to bowel motility Outcome: Progressing Goal: Will not experience complications related to urinary retention Outcome: Progressing   Problem: Pain Managment: Goal:  General experience of comfort will improve Outcome: Progressing   Problem: Safety: Goal: Ability to remain free from injury will improve Outcome: Progressing   Problem: Skin Integrity: Goal: Risk for impaired skin integrity will decrease Outcome: Progressing   Problem: Education: Goal: Ability to demonstrate management of disease process will improve Outcome: Progressing Goal: Ability to verbalize understanding of medication therapies will improve Outcome: Progressing Goal: Individualized Educational Video(s) Outcome: Progressing   Problem: Activity: Goal: Capacity to carry out activities will improve Outcome: Progressing   Problem: Cardiac: Goal: Ability to achieve and maintain adequate cardiopulmonary perfusion will improve Outcome: Progressing   Problem: Education: Goal: Will demonstrate proper wound care and an understanding of methods to prevent future damage Outcome: Progressing Goal: Knowledge of disease or condition will improve Outcome: Progressing Goal: Knowledge of the prescribed therapeutic regimen will improve Outcome: Progressing   Problem: Activity: Goal: Risk for activity intolerance will decrease Outcome: Progressing   Problem: Cardiac: Goal: Will achieve and/or maintain hemodynamic stability Outcome: Progressing   Problem: Clinical Measurements: Goal: Postoperative complications will be avoided or minimized Outcome: Progressing   Problem: Respiratory: Goal: Respiratory status will improve Outcome: Progressing   Problem: Skin Integrity: Goal: Wound healing without signs and symptoms of infection Outcome: Progressing Goal: Risk for impaired skin integrity will decrease Outcome: Progressing   Problem: Urinary Elimination: Goal: Ability to achieve and maintain adequate renal perfusion and functioning will improve Outcome: Progressing   

## 2020-01-29 NOTE — Progress Notes (Addendum)
2 Days Post-Op Procedure(s) (LRB): CORONARY ARTERY BYPASS GRAFTING (CABG), ON PUMP, TIMES FIVE, USING BILATERAL MAMMARY ARTERIES, LEFT RADIAL ARTERY, AND ENDOSCOPICALLY HARVESTED RIGHT GREATER SAPHENOUS VEIN. (N/A) RADIAL ARTERY HARVEST (Left) TRANSESOPHAGEAL ECHOCARDIOGRAM (TEE) (N/A) INDOCYANINE GREEN FLUORESCENCE IMAGING (ICG) (N/A) ENDOVEIN HARVEST OF GREATER SAPHENOUS VEIN (Right) CLIPPING OF ATRIAL APPENDAGE USING ATRICURE 35 MM ATRICLIP (Left) Subjective: Feels stronger today and able to walk a few steps in the hallway Remains on low-dose inotropic support for ischemic cardiomyopathy coox 68% Maintaining sinus rhythm Urine output improved with better blood pressure We will remove IJ catheter when IV inotropic support can be narrowed  Objective: Vital signs in last 24 hours: Temp:  [97 F (36.1 C)-97.8 F (36.6 C)] 97 F (36.1 C) (09/26 1205) Pulse Rate:  [95-106] 104 (09/26 1400) Cardiac Rhythm: Normal sinus rhythm (09/25 2030) Resp:  [5-29] 14 (09/26 1400) BP: (83-152)/(32-68) 128/52 (09/26 1100) SpO2:  [95 %-100 %] 100 % (09/26 1400) Weight:  [96.4 kg] 96.4 kg (09/26 0500)  Hemodynamic parameters for last 24 hours: CVP:  [4 mmHg-13 mmHg] 11 mmHg  Intake/Output from previous day: 09/25 0701 - 09/26 0700 In: 1536.8 [P.O.:240; I.V.:1217.3; IV Piggyback:79.4] Out: 2410 [Urine:1410; Chest Tube:1000] Intake/Output this shift: No intake/output data recorded.  Alert and responsive Chronic left-sided weakness unchanged Lungs clear Extremities with mild edema  Lab Results: Recent Labs    01/28/20 1936 01/29/20 0310  WBC 20.1* 16.0*  HGB 9.1* 8.3*  HCT 29.2* 26.4*  PLT 183 146*   BMET:  Recent Labs    01/28/20 1936 01/29/20 0310  NA 130* 131*  K 6.4* 5.6*  CL 100 100  CO2 24 22  GLUCOSE 242* 270*  BUN 28* 32*  CREATININE 2.14* 2.33*  CALCIUM 7.6* 7.6*    PT/INR:  Recent Labs    01/27/20 1503  LABPROT 17.3*  INR 1.5*   ABG    Component Value  Date/Time   PHART 7.333 (L) 01/27/2020 2211   HCO3 23.8 01/27/2020 2211   TCO2 25 01/27/2020 2211   ACIDBASEDEF 2.0 01/27/2020 2211   O2SAT 67.9 01/29/2020 0325   CBG (last 3)  Recent Labs    01/29/20 0440 01/29/20 0754 01/29/20 1203  GLUCAP 240* 250* 229*    Assessment/Plan: S/P Procedure(s) (LRB): CORONARY ARTERY BYPASS GRAFTING (CABG), ON PUMP, TIMES FIVE, USING BILATERAL MAMMARY ARTERIES, LEFT RADIAL ARTERY, AND ENDOSCOPICALLY HARVESTED RIGHT GREATER SAPHENOUS VEIN. (N/A) RADIAL ARTERY HARVEST (Left) TRANSESOPHAGEAL ECHOCARDIOGRAM (TEE) (N/A) INDOCYANINE GREEN FLUORESCENCE IMAGING (ICG) (N/A) ENDOVEIN HARVEST OF GREATER SAPHENOUS VEIN (Right) CLIPPING OF ATRIAL APPENDAGE USING ATRICURE 35 MM ATRICLIP (Left) Maintaining sinus rhythm after CABG-maze Creatinine postop with rise from 1.7-2.2 but making good urine Continue low-dose inotropic support with slow wean planned   LOS: 7 days    Douglas Carr 01/29/2020

## 2020-01-30 ENCOUNTER — Inpatient Hospital Stay (HOSPITAL_COMMUNITY): Payer: Medicare Other

## 2020-01-30 ENCOUNTER — Encounter (HOSPITAL_COMMUNITY): Payer: Self-pay | Admitting: Cardiothoracic Surgery

## 2020-01-30 LAB — BASIC METABOLIC PANEL
Anion gap: 6 (ref 5–15)
Anion gap: 8 (ref 5–15)
BUN: 39 mg/dL — ABNORMAL HIGH (ref 6–20)
BUN: 44 mg/dL — ABNORMAL HIGH (ref 6–20)
CO2: 25 mmol/L (ref 22–32)
CO2: 25 mmol/L (ref 22–32)
Calcium: 7.5 mg/dL — ABNORMAL LOW (ref 8.9–10.3)
Calcium: 7.7 mg/dL — ABNORMAL LOW (ref 8.9–10.3)
Chloride: 99 mmol/L (ref 98–111)
Chloride: 96 mmol/L — ABNORMAL LOW (ref 98–111)
Creatinine, Ser: 2.18 mg/dL — ABNORMAL HIGH (ref 0.61–1.24)
Creatinine, Ser: 2.14 mg/dL — ABNORMAL HIGH (ref 0.61–1.24)
GFR calc Af Amer: 39 mL/min — ABNORMAL LOW (ref >60)
GFR calc Af Amer: 40 mL/min — ABNORMAL LOW (ref >60)
GFR calc non Af Amer: 33 mL/min — ABNORMAL LOW (ref >60)
GFR calc non Af Amer: 34 mL/min — ABNORMAL LOW (ref >60)
Glucose, Bld: 140 mg/dL — ABNORMAL HIGH (ref 70–99)
Glucose, Bld: 122 mg/dL — ABNORMAL HIGH (ref 70–99)
Potassium: 4.4 mmol/L (ref 3.5–5.1)
Potassium: 4.6 mmol/L (ref 3.5–5.1)
Sodium: 130 mmol/L — ABNORMAL LOW (ref 135–145)
Sodium: 129 mmol/L — ABNORMAL LOW (ref 135–145)

## 2020-01-30 LAB — GLUCOSE, CAPILLARY
Glucose-Capillary: 110 mg/dL — ABNORMAL HIGH (ref 70–99)
Glucose-Capillary: 119 mg/dL — ABNORMAL HIGH (ref 70–99)
Glucose-Capillary: 168 mg/dL — ABNORMAL HIGH (ref 70–99)
Glucose-Capillary: 186 mg/dL — ABNORMAL HIGH (ref 70–99)
Glucose-Capillary: 68 mg/dL — ABNORMAL LOW (ref 70–99)
Glucose-Capillary: 79 mg/dL (ref 70–99)
Glucose-Capillary: 81 mg/dL (ref 70–99)

## 2020-01-30 LAB — CBC
HCT: 23.4 % — ABNORMAL LOW (ref 39.0–52.0)
Hemoglobin: 7.3 g/dL — ABNORMAL LOW (ref 13.0–17.0)
MCH: 28 pg (ref 26.0–34.0)
MCHC: 31.2 g/dL (ref 30.0–36.0)
MCV: 89.7 fL (ref 80.0–100.0)
Platelets: 161 10*3/uL (ref 150–400)
RBC: 2.61 MIL/uL — ABNORMAL LOW (ref 4.22–5.81)
RDW: 13.2 % (ref 11.5–15.5)
WBC: 13.8 10*3/uL — ABNORMAL HIGH (ref 4.0–10.5)
nRBC: 0 % (ref 0.0–0.2)

## 2020-01-30 LAB — COOXEMETRY PANEL
Carboxyhemoglobin: 1.5 % (ref 0.5–1.5)
Methemoglobin: 0.9 % (ref 0.0–1.5)
O2 Saturation: 67.1 %
Total hemoglobin: 7.6 g/dL — ABNORMAL LOW (ref 12.0–16.0)

## 2020-01-30 MED ORDER — EPINEPHRINE HCL 5 MG/250ML IV SOLN IN NS: 3.0000 ug/min | INTRAVENOUS | Status: DC

## 2020-01-30 MED FILL — Vasopressin IV Soln 20 Unit/ML (For IV Infusion): INTRAVENOUS | Qty: 1 | Status: AC

## 2020-01-30 MED FILL — Sodium Chloride IV Soln 0.9%: INTRAVENOUS | Qty: 100 | Status: AC

## 2020-01-30 NOTE — Evaluation (Signed)
Occupational Therapy Evaluation Patient Details Name: Douglas Carr MRN: [id number] DOB: 12-23-1965 Today's Date: 01/30/2020    History of Present Illness 54 y.o. male with medical history significant of ischemic cardiomyopathy, systolic heart failure, CVA with L-sided hemiparesis, type 2 diabetes mellitus, dyslipidemia, hypertension and anxiety. Presents to ED with dyspnea and chest pain over 4 day period. Admitted 01/22/20 for treatment of Acute decompensated systolic heart failure, complicated by acute cardiogenic pulmonary edema (acute hypoxic respiratory failure and NSTEMI. Pt s/p 9/21 heart catheterization, and 9/24 CABGx5   Clinical Impression   Pt ambulated with a RW and avoided socks as he could not don without help, but was otherwise independent in ADL and IADL. 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 Pt presents with post operative chest pain, generalized weakness and unsteadiness in standing. He requires set up to max assist for ADL. Pt likely to progress well. Recommending CIR for intensive therapy with goal of modified independence in ADL and IADL. Pt is highly motivated with a supportive family.     Follow Up Recommendations  CIR    Equipment Recommendations  None recommended by OT    Recommendations for Other Services       Precautions / Restrictions Precautions Precautions: Sternal Precaution Comments: foley      Mobility Bed Mobility Overal bed mobility: Needs Assistance Bed Mobility: Supine to Sit     Supine to sit: Min guard     General bed mobility comments: HOB up  Transfers Overall transfer level: Needs assistance Equipment used: None Transfers: Sit to/from [employer] Transfers Sit to Stand: +2 physical assistance;Min guard Stand pivot transfers: +2 physical assistance;Min guard       General transfer comment: cues for hands on knees and to use momentum from bed    Balance Overall balance  assessment: Needs assistance   Sitting balance-Leahy Scale: Fair       Standing balance-Leahy Scale: Fair Standing balance comment: statically                           ADL either performed or assessed with clinical judgement   ADL Overall ADL's : Needs assistance/impaired Eating/Feeding: Independent   Grooming: Set up;Sitting   Upper Body Bathing: Moderate assistance;Sitting   Lower Body Bathing: Maximal assistance;Sit to/from stand   Upper Body Dressing : Moderate assistance;Sitting   Lower Body Dressing: Maximal assistance;Sit to/from stand   Toilet Transfer: Min guard;+2 for physical assistance;Stand-pivot;BSC   Toileting- Clothing Manipulation and Hygiene: Maximal assistance;Sit to/from stand               Vision Patient Visual Report: No change from baseline       Perception     Praxis      Pertinent Vitals/Pain Pain Assessment: Faces Faces Pain Scale: Hurts even more Pain Location: chest with coughing Pain Descriptors / Indicators: Sore;Aching Pain Intervention(s): Monitored during session     Hand Dominance Right   Extremity/Trunk Assessment Upper Extremity Assessment Upper Extremity Assessment: Overall WFL for tasks assessed   Lower Extremity Assessment Lower Extremity Assessment: Defer to PT evaluation   Cervical / Trunk Assessment Cervical / Trunk Assessment: Normal   Communication Communication Communication: No difficulties (slightly dysarthric)   Cognition Arousal/Alertness: Awake/alert Behavior During Therapy: WFL for tasks assessed/performed Overall Cognitive Status: Within Functional Limits for tasks assessed  General Comments       Exercises     Shoulder Instructions      Home Living Family/patient expects to be discharged to:: Private residence Living Arrangements: Spouse/significant other;Children Available Help at Discharge: Family;Available 24  hours/day Type of Home: House Home Access: Stairs to enter [employer] of Steps: 4 Entrance Stairs-Rails: Can reach both Home Layout: One level     Bathroom Shower/Tub: Chief Strategy Officer: Handicapped height     Home Equipment: Environmental consultant - 2 wheels;Bedside commode;Grab bars - tub/shower;Wheelchair - Geophysical data processor          Prior Functioning/Environment Level of Independence: Independent with assistive device(s)        Comments: uses RW for mobility, independent in ADLs and iADLS, avoids socks, wears crocs        OT Problem List: Decreased activity tolerance;Impaired balance (sitting and/or standing);Decreased knowledge of use of DME or AE;Pain      OT Treatment/Interventions: Self-care/ADL training;DME and/or AE instruction;Therapeutic activities;Patient/family education;Balance training    OT Goals(Current goals can be found in the care plan section) Acute Rehab 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 Goal Formulation: With patient Time For Goal Achievement: 02/13/20 Potential to Achieve Goals: Good ADL Goals Pt Will Perform Grooming: with modified independence;standing Pt Will Perform Upper Body Bathing: with modified independence;sitting Pt Will Perform Lower Body Bathing: with modified independence;sit to/from stand Pt Will Perform Upper Body Dressing: with modified independence;sitting Pt Will Transfer to Toilet: with modified independence;ambulating Pt Will Perform Toileting - Clothing Manipulation and hygiene: with modified independence;sit to/from stand Additional ADL Goal #1: Pt will adhere to sternal precautions during ADL and mobility.  OT Frequency: Min 2X/week   Barriers to D/C:            Co-evaluation              AM-PAC OT "6 Clicks" Daily Activity     Outcome Measure Help from another person eating meals?: None Help from another person taking care of personal grooming?: A Little Help from  another person toileting, which includes using toliet, bedpan, or urinal?: A Lot Help from another person bathing (including washing, rinsing, drying)?: A Lot Help from another person to put on and taking off regular upper body clothing?: A Lot Help from another person to put on and taking off regular lower body clothing?: A Lot 6 Click Score: 15   End of Session Equipment Utilized During Treatment: Gait belt  Activity Tolerance: Patient tolerated treatment well Patient left: in chair;with call bell/phone within reach;with family/visitor present  OT Visit Diagnosis: Unsteadiness on feet (R26.81);Other abnormalities of gait and mobility (R26.89);Pain                Time: 2706-2376 OT Time Calculation (min): 25 min Charges:  OT General Charges $OT Visit: 1 Visit OT Evaluation $OT Eval Moderate Complexity: 1 Mod OT Treatments $Self Care/Home Management : 8-22 mins  Martie Round, OTR/L Acute Rehabilitation Services Pager: [phone] Office: [phone]  Evern Bio 01/30/2020, 2:52 PM

## 2020-01-30 NOTE — Progress Notes (Addendum)
[facility] DAILY ICU PROGRESS NOTE                   [address]            [place] [address]          [phone]   3 Days Post-Op Procedure(s) (LRB): CORONARY ARTERY BYPASS GRAFTING (CABG), ON PUMP, TIMES FIVE, USING BILATERAL MAMMARY ARTERIES, LEFT RADIAL ARTERY, AND ENDOSCOPICALLY HARVESTED RIGHT GREATER SAPHENOUS VEIN. (N/A) RADIAL ARTERY HARVEST (Left) TRANSESOPHAGEAL ECHOCARDIOGRAM (TEE) (N/A) INDOCYANINE GREEN FLUORESCENCE IMAGING (ICG) (N/A) ENDOVEIN HARVEST OF GREATER SAPHENOUS VEIN (Right) CLIPPING OF ATRIAL APPENDAGE USING ATRICURE 35 MM ATRICLIP (Left)  Total Length of Stay:  LOS: 8 days   Subjective: Patient sitting in chair this am. He has no specific complaint.  Objective: Vital signs in last 24 hours: Temp:  [97 F (36.1 C)-98.5 F (36.9 C)] 98.5 F (36.9 C) (09/27 0350) Pulse Rate:  [97-105] 103 (09/27 0700) Cardiac Rhythm: Normal sinus rhythm (09/27 0400) Resp:  [3-29] 19 (09/27 0700) BP: (87-154)/(42-81) 129/72 (09/27 0700) SpO2:  [93 %-100 %] 95 % (09/27 0700) Weight:  [96.5 kg] 96.5 kg (09/27 0500)  Filed Weights   01/28/20 0500 01/29/20 0500 01/30/20 0500  Weight: 95.8 kg 96.4 kg 96.5 kg    Weight change: 0.1 kg   Hemodynamic parameters for last 24 hours: CVP:  [10 mmHg-12 mmHg] 12 mmHg  Intake/Output from previous day: 09/26 0701 - 09/27 0700 In: 699.6 [I.V.:699.6] Out: 2640 [Urine:2000; Chest Tube:640]  Intake/Output this shift: No intake/output data recorded.  Current Meds: Scheduled Meds: . acetaminophen  1,000 mg Oral Q6H   Or  . acetaminophen (TYLENOL) oral liquid 160 mg/5 mL  1,000 mg Per Tube Q6H  . aspirin EC  325 mg Oral Daily   Or  . aspirin  324 mg Per Tube Daily  . atorvastatin  80 mg Oral Daily  . bisacodyl  10 mg Oral Daily   Or  . bisacodyl  10 mg Rectal Daily  . carvedilol  3.125 mg Oral BID WC  . Chlorhexidine Gluconate Cloth  6 each Topical Q0600  . Chlorhexidine Gluconate Cloth  6 each Topical  Daily  . docusate sodium  200 mg Oral Daily  . DULoxetine  60 mg Oral Daily  . furosemide  40 mg Intravenous Daily  . insulin aspart  0-24 Units Subcutaneous Q4H  . insulin glargine  20 Units Subcutaneous BID  . isosorbide dinitrate  5 mg Oral BID  . mouth rinse  15 mL Mouth Rinse BID  . mirtazapine  7.5 mg Oral QHS  . mupirocin ointment  1 application Nasal BID  . pantoprazole  40 mg Oral Daily  . primidone  50 mg Oral QHS  . sodium chloride flush  10-40 mL Intracatheter Q12H  . sodium chloride flush  3 mL Intravenous Q12H  . sodium chloride flush  3 mL Intravenous Q12H  . tamsulosin  0.4 mg Oral QPC supper   Continuous Infusions: . sodium chloride Stopped (01/28/20 1346)  . sodium chloride Stopped (01/24/20 1335)  . sodium chloride    . sodium chloride    . sodium chloride 10 mL/hr at 01/28/20 1900  . epinephrine 3 mcg/min (01/30/20 0700)  . insulin Stopped (01/28/20 1410)  . lactated ringers Stopped (01/28/20 1357)  . lactated ringers Stopped (01/29/20 1721)  . milrinone 0.25 mcg/kg/min (01/30/20 0700)  . norepinephrine (LEVOPHED) Adult infusion Stopped (01/29/20 1941)  . phenylephrine (NEO-SYNEPHRINE) Adult infusion Stopped (01/27/20 1551)  PRN Meds:.sodium chloride, sodium chloride, sodium chloride, dextrose, diazepam, morphine injection, nitroGLYCERIN, ondansetron (ZOFRAN) IV, oxyCODONE, sodium chloride flush, sodium chloride flush, sodium chloride flush, traMADol  General appearance: alert, cooperative and no distress Neurologic: intact Heart: Slightly tachycardia Lungs: Dimished bibasilar breath sounds Abdomen: Soft, non tender, bowel sounds present Extremities: Mild LE edema. Motor/sensory intact LUE Wound: Aquacel dressing removed from sternal wound and wound is clean and dry. LUE dressing removed and wound is clean and dry.  Lab Results: CBC: Recent Labs    01/29/20 0310 01/30/20 0316  WBC 16.0* 13.8*  HGB 8.3* 7.3*  HCT 26.4* 23.4*  PLT 146* 161    BMET:  Recent Labs    01/29/20 1803 01/30/20 0316  NA 130* 130*  K 4.4 4.4  CL 98 99  CO2 24 25  GLUCOSE 186* 140*  BUN 35* 39*  CREATININE 2.27* 2.18*  CALCIUM 7.5* 7.5*    CMET: Lab Results  Component Value Date   WBC 13.8 (H) 01/30/2020   HGB 7.3 (L) 01/30/2020   HCT 23.4 (L) 01/30/2020   PLT 161 01/30/2020   GLUCOSE 140 (H) 01/30/2020   CHOL 150 09/27/2019   TRIG 261 (H) 09/27/2019   HDL 34 (L) 09/27/2019   LDLCALC 73 09/27/2019   ALT 38 01/22/2020   AST 28 01/22/2020   NA 130 (L) 01/30/2020   K 4.4 01/30/2020   CL 99 01/30/2020   CREATININE 2.18 (H) 01/30/2020   BUN 39 (H) 01/30/2020   CO2 25 01/30/2020   TSH 3.503 05/07/2016   INR 1.5 (H) 01/27/2020   HGBA1C 13.8 (H) 01/27/2020   MICROALBUR neg 10/20/2014      PT/INR:  Recent Labs    01/27/20 1503  LABPROT 17.3*  INR 1.5*   Radiology: [facility] Chest Port 1 View  Result Date: 01/30/2020 CLINICAL DATA:  Chest tube. EXAM: PORTABLE CHEST 1 VIEW COMPARISON:  01/29/2020. FINDINGS: Interim removal of right IJ sheath. Right PICC line and bilateral chest tubes in stable position. Cardiac pacer stable position. Prior CABG. Left atrial appendage clip in stable position. Cardiomegaly. No pulmonary venous congestion. Low lung volumes. Persistent left base atelectasis/infiltrate. Persistent small left pleural effusion. IMPRESSION: 1. Interim removal of right IJ sheath. Right PICC line and bilateral chest tubes in stable position. No pneumothorax. 2. Cardiac pacer stable position. Prior CABG. Left atrial appendage clip in stable position. Cardiomegaly. 3. Low lung volumes with persistent left base atelectasis/infiltrate and small left pleural effusion. Electronically Signed   By: Maisie Fus  Register   On: 01/30/2020 06:20   Assessment/Plan: S/P Procedure(s) (LRB): CORONARY ARTERY BYPASS GRAFTING (CABG), ON PUMP, TIMES FIVE, USING BILATERAL MAMMARY ARTERIES, LEFT RADIAL ARTERY, AND ENDOSCOPICALLY HARVESTED RIGHT GREATER  SAPHENOUS VEIN. (N/A) RADIAL ARTERY HARVEST (Left) TRANSESOPHAGEAL ECHOCARDIOGRAM (TEE) (N/A) INDOCYANINE GREEN FLUORESCENCE IMAGING (ICG) (N/A) ENDOVEIN HARVEST OF GREATER SAPHENOUS VEIN (Right) CLIPPING OF ATRIAL APPENDAGE USING ATRICURE 35 MM ATRICLIP (Left)   1. CV-S/p NSTEMI. On Isordil 5 mg tid, Coreg 3.125 mg bid, Milrinone drip at 0.25, and Epi drip. Co ox this am 67.1 2. Pulmonary-on room air this am. Chest tubes with 640 last 24 hours. CXR this am appears to show patient rotated to the right, cardiomegaly, low lung volumes, small left pleural effusion/atelectasis. Hope to remove chest tubes soon. Encourage incentive spirometer. 3. Volume overload-on Lasix 40 mg IV daily 4. Expected post op blood loss anemia-H and H this am decreased to 7.3 and 23.4. Monitor need for transfusion. 5. DM-CBGs 127/79/68 . Pre op HGA1C 13.8.  Will need close medical follow up after discharge 6. Creatinine this am slightly decreased to 2.18. Re check in am.  Ardelle Balls PA-C 01/30/2020 7:40 AM   Pt seen and examined; chart reviewed. Agree with documentation. Wean epi; if successful wean to "off" will txfer to [facility]. Tou Hayner Z. Vickey Sages, MD [phone]

## 2020-01-30 NOTE — Discharge Summary (Signed)
Physician Discharge Summary       [address]       [place] [address]             [phone]    Patient ID: Douglas Carr MRN: [id number] DOB/AGE: 1965/10/20 54 y.o.  Admit date: 01/22/2020 Discharge date: 02/04/2020  Admission Diagnoses: 1. NSTEMI (non-ST elevated myocardial infarction) ([place]) 2. Coronary artery disease involving native coronary artery of native heart with angina pectoris [facility]) Discharge Diagnoses:  1.   S/P CABG x 5 2. Expected post op blood loss anemia 3. History of Essential hypertension 4. History of Chronic combined systolic and diastolic HF (heart failure), NYHA class 3 ([place]) 5. History of Diabetes mellitus with complication ([place]) 6. History of Dilated cardiomyopathy 7. History of Anxiety and depression 8. History of Hyperlipidemia 9. History of STEMI (ST elevation myocardial infarction) ([place])  Consults: None  Procedure (s):   Coronary Artery Bypass Grafting x 5             Left Internal Mammary Artery to Distal Left Anterior Descending Coronary Artery, Saphenous Vein Graft to Posterior Descending Coronary Artery and right posterolateral coronary artery as a sequenced graft; left radial artery graft to  Obtuse Marginal Branch of Left Circumflex Coronary Artery, free right internal mammary artery graft to first diagonal Branch Coronary Artery; Endoscopic Vein Harvest from right thigh   Bilateral internal mammary artery harvesting Left radial artery harvesting, open Completion indocyanine green fluorescence imaging by Dr. Orvan Seen on 01/27/2020.  History of Presenting Illness: Douglas Carr is a 54 yo male with known history of poorly controlled DM, Hyperlipidemia, Anxiety and Depression, R CEA,  CHF, and longstanding history of CAD.  His CAD dates back to the patient suffering his first MI when he was 54 years old which was treated medically.  He had a catheterization in 2010 which didn't show any significant findings.  He  presented with a STEMI on 05/07/2016 which resulted in DES to the OM.  He presented on 05/24/2016 with a cardiac arrest.  Emergent catheterization showed a 90% stenosis of of his OM and stenosis of the previous stented vessel. His hospital course was long and complicated.  The patient required [facility] protocol.  Attempts to wake patient up resulted in his suffering seizures.  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  He suffered residual effects with speech, mobility, and strength.  He required a life vest at discharge and was discharged to a SNF facility.  He was admitted there for 3-4 weeks and regained significant improvement in his symptoms.  Due to his low EF he was brought back to the hospital for ICD placement by Dr. Curt Bears.  This has not fired since placement and at his last device check on 07/12/2019 the device was functioning normally.  The patient presented to the ED on 01/22/2020 with a 4 day complaint of feeling poorly.  He was experiencing chest pressure when lying flat and he has gradual worsening shortness of breath.  He has been experiencing a cough with production of yellow sputum.   He states that his glucose meter had also been giving him "high" readings.  He admitted to not taking medications for several months, but took doses on day of presentation.  He got a COVID test at [facility] which was negative.  He has been vaccinated.  Workup in the ED showed the patient's blood sugar to be 570.  CXR obtained shows a  left sided pleural effusion.   Troponin level was elevated.  He was ruled in for NSTEMI and was admitted for further care and further cardiac workup.  Upon admission he was treated for CHF with aggressive diuretics.  He was started on insulin protocol for better glucose control.  He was placed on a Heparin drip.  He was placed on a Levophed drip for Hypotension.  Cardiology consult was obtained who recommended transfer to [facility] for  further care. Upon arrival to [facility] the patient remained in stable condition.  He was taken for cardiac catheterization which was performed on 01/23/2021.  This showed severe 3 vessel CAD.  It was felt the patient would require aggressive heart failure management.  Once optimized medically it was felt coronary bypass grafting would be indicated with a possibility of inotropic and mechanical support.  Cardiothoracic surgery consultation was requested.  Currently the patient is up in bed eating lunch.  His wife is present at bedside and helped provide historical events.  The patient currently denies chest pain, shortness of breath.  They state he is pretty functional at home.  [place] and he tries to get out and do some things.  He does have continued weakness from his previously suffered stroke and at times experiences aphasia which is frustrating to him.  His diabetes is poorly controlled.  He is not a smoker.  He states his grandfather experienced heart problems in his 34s.  He is agreeable to proceed with coronary artery bypass grafting surgery. Pre operative carotid duplex US showed of a total occlusion of right ICA and a 40-59% stenosis of the left ICA. He underwent a CABG x 5 on 01/27/2020.  Brief Hospital Course:  The patient was extubated later the evening of surgery without difficulty. He remained afebrile and hemodynamically stable. He was weaned off Milrinone and Epinephrine drips. Gordy Councilman, a line,  and foley were removed early in the post operative course. Chest tubes remained for a few days then once output decreased were then removed. Coreg was started and titrated accordingly.  Of note, he had elevated creatinine post op, llikely AKI. It went as high as 2.33. His last creatinine was down to 2.03. He was volume over loaded and diuresed, with careful monitoring of his creatinine. He had expected post op blood loss anemia. He did  require a post op transfusion on 09/30. Last  H and H was 8.6 and 26.8  He was weaned off the insulin drip.   The patient's glucose remained well controlled on Insulin. He was not restarted on oral agents secondary to elevated creatinine. The patient's HGA1C pre op was 13.8. He will need close medical follow up after discharge. The patient was felt surgically stable for transfer from the ICU to [facility] for further convalescence on 09/28. He continues to progress with cardiac rehab. He was ambulating on room air. He has been tolerating a diet and has had a bowel movement. Epicardial pacing wires were removed on 09/28.  Chest tube sutures will be removed the day of discharge. CIR was consulted but patient declined as he wants to go home. Home PT and OT have been arranged. As discussed with Dr. Orvan Seen, patient will be discharged on Lasix 80 mg bid. BMET will be checked Wednesday 10/06 to monitor his creatinine.The patient is felt surgically stable for discharge today.   Latest Vital Signs: Blood pressure 139/70, pulse 88, temperature 97.7 F (36.5 C), temperature source Oral, resp.  rate 18, height 5' 10"  (1.778 m), weight 99 kg, SpO2 97 %.  Physical Exam: Cardiovascular: RRR Pulmonary: Diminished bibasilar breath sounds Abdomen: Soft, non tender, bowel sounds present. Extremities: Mild bilateral lower extremity edema. Wounds: Lower sternal wound has some dried sero sanguionous drainage on dressing but no active drainage this am. No sign of infection. LUE wound is clean and dry.  No erythema or signs of infection.  Discharge Condition: Stable and discharged to SNF.  Recent laboratory studies:  Lab Results  Component Value Date   WBC 10.9 (H) 02/03/2020   HGB 8.6 (L) 02/03/2020   HCT 26.8 (L) 02/03/2020   MCV 87.6 02/03/2020   PLT 419 (H) 02/03/2020   Lab Results  Component Value Date   NA 132 (L) 02/04/2020   K 4.8 02/04/2020   CL 94 (L) 02/04/2020   CO2 29 02/04/2020   CREATININE 2.03 (H) 02/04/2020   GLUCOSE 113 (H) 02/04/2020       Diagnostic Studies: DG Chest 1 View  Result Date: 01/31/2020 CLINICAL DATA:  Pleural effusion.  Open heart surgery. EXAM: CHEST  1 VIEW COMPARISON:  01/30/2020 FINDINGS: Postoperative changes in the mediastinum. Cardiac pacemaker. Right PICC line is unchanged in position. Right chest tube is been removed. Cardiac enlargement. Probable left pleural effusion with atelectasis in the left base. Calcified granulomas in the right mid lung. No residual pneumothorax. IMPRESSION: Interval removal of right chest tube. No residual pneumothorax. Electronically Signed   By: Lucienne Capers M.D.   On: 01/31/2020 06:27   DG Chest 2 View  Result Date: 02/01/2020 CLINICAL DATA:  Recent coronary artery bypass grafting EXAM: CHEST - 2 VIEW COMPARISON:  January 31, 2020 FINDINGS: Central catheter tip is in the superior vena cava. No pneumothorax. There is a partially loculated left pleural effusion with atelectatic change in the left mid and lower lung regions. Mild consolidation is noted in the medial left base region. There are calcified granulomas present. No edema or airspace opacity on the right. There is cardiomegaly with pulmonary vascular normal. Patient is status post coronary artery bypass grafting. There is a left atrial appendage clamp. Pacemaker device present on the left with pacemaker lead attached to right ventricle. No adenopathy evident. No bone lesions. IMPRESSION: Central catheter tip in superior vena cava. Persistent partially loculated left pleural effusion with atelectasis in the left mid and lower lung regions. A degree of consolidation in the left lower lobe is probably primarily due to atelectasis, a stable appearance. No new opacity. There are calcified granulomas on the right. Stable cardiomegaly with postoperative changes as noted. No pneumothorax. Electronically Signed   By: Lowella Grip III M.D.   On: 02/01/2020 08:11   CARDIAC CATHETERIZATION  Result Date: 01/24/2020  Severe 3 vessel  CAD  Patent LM  Proximal to mid LAD 60-70% with evidence of hemodynamic significance, DFR = 0.7.  Ostial to mid circumflex diffuse ISR 99%.  Ostial 95% RCA.  LVEDP 33 mmHg, consistent with acute on chronic combined systolic and diastolic HF.  Moderate pulmonary hypertension with mean PAP 40 mmHg. WHO Group II  (PVR=2 [place] units; PCWP mean 30 mmHg)  Cardiac output 4.9 l/min  PA saturation 57% RECOMMENDATION:  Aggressive management CHF. May require inotrope and mechanical support.  Once HF controlled, consider CABG.  DG Chest Port 1 View  Result Date: 01/30/2020 CLINICAL DATA:  Chest tube. EXAM: PORTABLE CHEST 1 VIEW COMPARISON:  01/29/2020. FINDINGS: Interim removal of right IJ sheath. Right PICC line and bilateral chest tubes  in stable position. Cardiac pacer stable position. Prior CABG. Left atrial appendage clip in stable position. Cardiomegaly. No pulmonary venous congestion. Low lung volumes. Persistent left base atelectasis/infiltrate. Persistent small left pleural effusion. IMPRESSION: 1. Interim removal of right IJ sheath. Right PICC line and bilateral chest tubes in stable position. No pneumothorax. 2. Cardiac pacer stable position. Prior CABG. Left atrial appendage clip in stable position. Cardiomegaly. 3. Low lung volumes with persistent left base atelectasis/infiltrate and small left pleural effusion. Electronically Signed   By: Marcello Moores  Register   On: 01/30/2020 06:20   DG Chest Port 1 View  Result Date: 01/29/2020 CLINICAL DATA:  Status post CABG. EXAM: PORTABLE CHEST 1 VIEW COMPARISON:  01/28/2020 and prior exams FINDINGS: UPPER limits normal heart size with CABG changes and LEFT atrial clip again noted. A LEFT pacemaker/AICD again identified. Swan-Ganz catheter has been removed with RIGHT IJ central venous catheter sheath remaining. Mediastinal/thoracostomy tubes and RIGHT PICC line are again noted. LEFT LOWER lung consolidation and LEFT pleural effusion again noted without  pneumothorax. IMPRESSION: Removal of Swan-Ganz catheter without other significant change. Electronically Signed   By: Margarette Canada M.D.   On: 01/29/2020 09:36   DG Chest Port 1 View  Result Date: 01/28/2020 CLINICAL DATA:  Status post CABG EXAM: PORTABLE CHEST 1 VIEW COMPARISON:  01/27/2020 FINDINGS: Interval extubation and removal of the enteric tube. Otherwise, stable support devices. Status post CABG with median sternotomy. Cardiac silhouette is enlarged, but similar to prior. Similar central venous congestion. Cardiac rhythm maintenance device in similar position. Similar small left pleural effusion with overlying opacities. No pneumothorax. Redemonstrated calcified granulomas in the right lung base, better characterized on remote CT chest from 04/12/2012. The visualized skeletal structures are unremarkable. IMPRESSION: 1. Interval extubation and removal of the enteric tube. Otherwise, stable support devices. 2. Similar small left pleural effusion with overlying left basilar opacity, likely atelectasis in the postoperative state. Electronically Signed   By: Margaretha Sheffield MD   On: 01/28/2020 09:53   DG Chest Port 1 View  Result Date: 01/27/2020 CLINICAL DATA:  Status post multivessel CABG EXAM: PORTABLE CHEST 1 VIEW COMPARISON:  01/25/2020 FINDINGS: Single frontal view of the chest demonstrates stable single lead pacer. Endotracheal tube overlies tracheal air column, tip well above carina. Enteric catheter passes below diaphragm tip excluded by collimation. Right internal jugular flow directed central venous catheter tip overlies main pulmonary outflow tract. Right-sided PICC tip overlies superior vena cava. Median sternotomy wires, surgical clips, and epicardial pacing wires overlie the mediastinum. Cardiac silhouette is enlarged but stable. Mild central vascular congestion and small left pleural effusion. No airspace disease or pneumothorax. IMPRESSION: 1. Support devices as above. 2. Central  vascular congestion and small left pleural effusion. Electronically Signed   By: Randa Ngo M.D.   On: 01/27/2020 15:55   DG CHEST PORT 1 VIEW  Result Date: 01/23/2020 CLINICAL DATA:  Chest pain EXAM: PORTABLE CHEST 1 VIEW COMPARISON:  01/22/2020 FINDINGS: Lung volumes are slightly small, however, pulmonary insufflation remain stable since prior examination. Moderate left pleural effusion appears slightly enlarged, partially loculated laterally. Perihilar interstitial pulmonary edema persists, stable since prior examination, possibly cardiogenic in nature. Benign calcified granuloma again noted at the right lung base. No pneumothorax. Cardiac size within normal limits. Left subclavian pacemaker defibrillator unchanged. IMPRESSION: Stable moderate perihilar interstitial pulmonary edema, possibly cardiogenic in nature. Increasing, moderate, partially laterally loculated left pleural effusion. Electronically Signed   By: Fidela Salisbury MD   On: 01/23/2020 07:01   DG  Chest Portable 1 View  Result Date: 01/22/2020 CLINICAL DATA:  54 year old male with shortness of breath for 4 days. Recently tested negative for COVID-19. EXAM: PORTABLE CHEST 1 VIEW COMPARISON:  Portable chest 05/20/2017 and earlier. FINDINGS: Portable AP upright view at 1628 hours. Perihilar and basilar predominant bilateral increased interstitial markings. Veiling opacity at the left lung base compatible with small pleural effusion. Possible trace right pleural effusion. No pneumothorax. Mediastinal contours remain normal. Chronic left chest cardiac AICD. Chronic calcified granulomas in the right lung. Visualized tracheal air column is within normal limits. No acute osseous abnormality identified. IMPRESSION: Increased bilateral pulmonary interstitial markings and left pleural effusion. Favor acute pulmonary edema. Electronically Signed   By: Genevie Ann M.D.   On: 01/22/2020 16:37   DG Chest Port 1V same Day  Result Date:  01/25/2020 CLINICAL DATA:  Congestive heart failure. EXAM: PORTABLE CHEST 1 VIEW COMPARISON:  01/23/2020 chest radiograph and prior. FINDINGS: Cardiomegaly. Central pulmonary vascular congestion. Patchy left predominant bibasilar opacities. Calcified right base granuloma. No pneumothorax. Small left pleural effusion, decreased from prior exam. Left chest wall pacing device with leads terminating over the right heart. Right upper extremity PICC tip overlying the superior cavoatrial junction. No acute osseous abnormalities. IMPRESSION: Improved pulmonary edema. Cardiomegaly and central pulmonary vascular congestion. Electronically Signed   By: Primitivo Gauze M.D.   On: 01/25/2020 10:30   ECHOCARDIOGRAM COMPLETE  Result Date: 01/23/2020    ECHOCARDIOGRAM REPORT   Patient Name:   [facility] Date of Exam: 01/23/2020 Medical Rec #:  [id number]           Height:       70.0 in Accession #:    [id number]          Weight:       200.0 lb Date of Birth:  02-Jun-1965           BSA:          2.087 m Patient Age:    48 years            BP:           108/58 mmHg Patient Gender: M                   HR:           72 bpm. Exam Location:  [facility] Procedure: 2D Echo Indications:    Dyspnea 786.09 / R06.00  History:        Patient has prior history of Echocardiogram examinations, most                 recent 04/13/2018. CAD and Previous Myocardial Infarction; Risk                 Factors:Hypertension, Diabetes, Dyslipidemia and Former Smoker.                 Dilated Cardiomyopathy.  Sonographer:    Leavy Cella RDCS (AE) Referring Phys: [id number] [place]  1. Left ventricular ejection fraction, by estimation, is 30%. The left ventricle has moderately decreased function. The left ventricle demonstrates global hypokinesis. Left ventricular diastolic parameters are consistent with Grade III diastolic dysfunction (restrictive). Elevated left atrial pressure.  2. Right ventricular systolic function  is normal. The right ventricular size is normal.  3. Left atrial size was moderately dilated.  4. A small pericardial effusion is present. The pericardial effusion is circumferential. Moderate pleural effusion in the left lateral region.  5. The  mitral valve is normal in structure. Mild mitral valve regurgitation. No evidence of mitral stenosis.  6. The aortic valve is tricuspid. Aortic valve regurgitation is not visualized. No aortic stenosis is present.  7. The inferior vena cava is normal in size with greater than 50% respiratory variability, suggesting right atrial pressure of 3 mmHg. FINDINGS  Left Ventricle: Left ventricular ejection fraction, by estimation, is 30%. The left ventricle has moderately decreased function. The left ventricle demonstrates global hypokinesis. The left ventricular internal cavity size was normal in size. There is no left ventricular hypertrophy. Left ventricular diastolic parameters are consistent with Grade III diastolic dysfunction (restrictive). Elevated left atrial pressure. Right Ventricle: The right ventricular size is normal. No increase in right ventricular wall thickness. Right ventricular systolic function is normal. Left Atrium: Left atrial size was moderately dilated. Right Atrium: Right atrial size was normal in size. Pericardium: A small pericardial effusion is present. The pericardial effusion is circumferential. Mitral Valve: The mitral valve is normal in structure. Mild mitral valve regurgitation. No evidence of mitral valve stenosis. Tricuspid Valve: The tricuspid valve is normal in structure. Tricuspid valve regurgitation is not demonstrated. No evidence of tricuspid stenosis. Aortic Valve: The aortic valve is tricuspid. Aortic valve regurgitation is not visualized. No aortic stenosis is present. Aortic valve mean gradient measures 1.5 mmHg. Aortic valve peak gradient measures 2.4 mmHg. Aortic valve area, by VTI measures 2.01 cm. Pulmonic Valve: The pulmonic valve  was not well visualized. Pulmonic valve regurgitation is not visualized. No evidence of pulmonic stenosis. Aorta: The aortic root is normal in size and structure. Pulmonary Artery: Indeterminate PASP, inadequate TR jet. Venous: The inferior vena cava is normal in size with greater than 50% respiratory variability, suggesting right atrial pressure of 3 mmHg. IAS/Shunts: No atrial level shunt detected by color flow Doppler. Additional Comments: A pacer wire is visualized. There is a moderate pleural effusion in the left lateral region.  LEFT VENTRICLE PLAX 2D LVIDd:         5.64 cm  Diastology LVIDs:         4.98 cm  LV e' medial:    4.40 cm/s LV PW:         1.20 cm  LV E/e' medial:  16.8 LV IVS:        1.08 cm  LV e' lateral:   8.45 cm/s LVOT diam:     2.10 cm  LV E/e' lateral: 8.8 LV SV:         28 LV SV Index:   14 LVOT Area:     3.46 cm  RIGHT VENTRICLE RV S prime:     8.27 cm/s TAPSE (M-mode): 1.7 cm LEFT ATRIUM             Index       RIGHT ATRIUM           Index LA diam:        3.50 cm 1.68 cm/m  RA Area:     14.30 cm LA Vol (A2C):   64.7 ml 31.00 ml/m RA Volume:   38.10 ml  18.25 ml/m LA Vol (A4C):   80.1 ml 38.37 ml/m LA Biplane Vol: 76.8 ml 36.79 ml/m  AORTIC VALVE AV Area (Vmax):    2.18 cm AV Area (Vmean):   1.99 cm AV Area (VTI):     2.01 cm AV Vmax:           78.18 cm/s AV Vmean:  59.054 cm/s AV VTI:            0.141 m AV Peak Grad:      2.4 mmHg AV Mean Grad:      1.5 mmHg LVOT Vmax:         49.10 cm/s LVOT Vmean:        34.000 cm/s LVOT VTI:          0.082 m LVOT/AV VTI ratio: 0.58  AORTA Ao Root diam: 2.90 cm MITRAL VALVE MV Area (PHT): 4.17 cm    SHUNTS MV Decel Time: 182 msec    Systemic VTI:  0.08 m MR Peak grad: 59.3 mmHg    Systemic Diam: 2.10 cm MR Mean grad: 43.0 mmHg MR Vmax:      385.00 cm/s MR Vmean:     313.0 cm/s MV E velocity: 74.10 cm/s MV A velocity: 20.60 cm/s MV E/A ratio:  3.60 Carlyle Dolly MD Electronically signed by Carlyle Dolly MD Signature Date/Time:  01/23/2020/10:49:30 AM    Final    ECHO INTRAOPERATIVE TEE  Result Date: 01/27/2020  *INTRAOPERATIVE TRANSESOPHAGEAL REPORT *  Patient Name:   KAILO KOSIK [facility] Date of Exam: 01/27/2020 Medical Rec #:  [id number]           Height:       70.0 in Accession #:    [id number]          Weight:       205.7 lb Date of Birth:  02-Nov-1965           BSA:          2.11 m Patient Age:    54 years            BP:           106/66 mmHg Patient Gender: M                   HR:           76 bpm. Exam Location:  Anesthesiology Transesophogeal exam was perform intraoperatively during surgical procedure. Patient was closely monitored under general anesthesia during the entirety of examination. Indications:     CAD Sonographer:     Dustin Flock RDCS Performing Phys: Roderic Palau MD Complications: No known complications during this procedure. POST-OP IMPRESSIONS - Left Ventricle: Post bypass the left ventricle was much improved from prior exam. LV function now estimated to be 40-45%. - Aortic Valve: The aortic valve appears unchanged from pre-bypass. - Mitral Valve: The mitral valve appears unchanged from pre-bypass. - Tricuspid Valve: The tricuspid valve appears unchanged from pre-bypass. PRE-OP FINDINGS  Left Ventricle: The left ventricle has severely reduced systolic function, with an ejection fraction of 25-30%. The cavity size was severely dilated. There is no increase in left ventricular wall thickness. Right Ventricle: The right ventricle has normal systolic function. The cavity was normal. There is no increase in right ventricular wall thickness. Left Atrium: Left atrial size was not assessed. The left atrial appendage is well visualized and there is evidence of thrombus present. Right Atrium: Right atrial size was not assessed. Interatrial Septum: No atrial level shunt detected by color flow Doppler. Pericardium: A small pericardial effusion is present. The pericardial effusion is localized near the right ventricle.  Mitral Valve: The mitral valve is normal in structure. Mild thickening of the mitral valve leaflet. Mild calcification of the mitral valve leaflet. Mitral valve regurgitation is mild to moderate by color flow Doppler. The MR jet is centrally-directed. Tricuspid Valve:  The tricuspid valve was normal in structure. Tricuspid valve regurgitation was not visualized by color flow Doppler. Aortic Valve: The aortic valve is normal in structure. Aortic valve regurgitation was not visualized by color flow Doppler. There is no evidence of aortic valve stenosis. Pulmonic Valve: The pulmonic valve was normal in structure. Pulmonic valve regurgitation is trivial by color flow Doppler. +--------------+--------++ LEFT VENTRICLE         +--------------+--------++ PLAX 2D                +--------------+--------++ LVIDd:        6.67 cm  +--------------+--------++ LVIDs:        5.90 cm  +--------------+--------++ LVOT diam:    2.10 cm  +--------------+--------++ LV SV:        56 ml    +--------------+--------++ LV SV Index:  25.73    +--------------+--------++ LVOT Area:    3.46 cm +--------------+--------++                        +--------------+--------++  +--------------+-------+ SHUNTS                +--------------+-------+ Systemic Diam:2.10 cm +--------------+-------+  Roderic Palau MD Electronically signed by Roderic Palau MD Signature Date/Time: 01/27/2020/3:42:23 PM    Final    VAS US DOPPLER PRE CABG  Result Date: 01/26/2020 PREOPERATIVE VASCULAR EVALUATION  Indications:            Pre-CABG. Risk Factors:           Hypertension, hyperlipidemia, Diabetes, coronary artery                         disease. Vascular Interventions: Cardiomyopathy. Performing Technologist: Sharion Dove RVS  Examination Guidelines: A complete evaluation includes B-mode imaging, spectral Doppler, color Doppler, and power Doppler as needed of all accessible portions of each vessel. Bilateral  testing is considered an integral part of a complete examination. Limited examinations for reoccurring indications may be performed as noted.  Right Carotid Findings: +----------+--------+--------+--------+--------+--------+           PSV cm/sEDV cm/sStenosisDescribeComments +----------+--------+--------+--------+--------+--------+ CCA Prox                  Occluded                 +----------+--------+--------+--------+--------+--------+ CCA Mid                   Occluded                 +----------+--------+--------+--------+--------+--------+ CCA Distal                Occluded                 +----------+--------+--------+--------+--------+--------+ ICA Prox                  Occluded                 +----------+--------+--------+--------+--------+--------+ ICA Mid                   Occluded                 +----------+--------+--------+--------+--------+--------+ ICA Distal                Occluded                 +----------+--------+--------+--------+--------+--------+ ECA       88  14                               +----------+--------+--------+--------+--------+--------+ Portions of this table do not appear on this page. +----------+--------+-------+--------+------------+           PSV cm/sEDV cmsDescribeArm Pressure +----------+--------+-------+--------+------------+ Subclavian187                                 +----------+--------+-------+--------+------------+ +---------+--------+--------+--------+ VertebralPSV cm/sEDV cm/soccluded +---------+--------+--------+--------+ Left Carotid Findings: +----------+--------+--------+--------+--------+------------------+           PSV cm/sEDV cm/sStenosisDescribeComments           +----------+--------+--------+--------+--------+------------------+ CCA Prox  98      29                      intimal thickening +----------+--------+--------+--------+--------+------------------+ CCA  Distal105     32                      intimal thickening +----------+--------+--------+--------+--------+------------------+ ICA Prox  165     64      40-59%  calcific                   +----------+--------+--------+--------+--------+------------------+ ICA Distal100     45                                         +----------+--------+--------+--------+--------+------------------+ ECA       132     24                                         +----------+--------+--------+--------+--------+------------------+ +----------+--------+--------+--------+------------+ SubclavianPSV cm/sEDV cm/sDescribeArm Pressure +----------+--------+--------+--------+------------+           51                                   +----------+--------+--------+--------+------------+ +---------+--------+--+--------+--+----------+ VertebralPSV cm/s66EDV cm/s15Retrograde +---------+--------+--+--------+--+----------+  ABI Findings: +--------+------------------+-----+---------+--------+ Right   Rt Pressure (mmHg)IndexWaveform Comment  +--------+------------------+-----+---------+--------+ Brachial                       triphasicPICC     +--------+------------------+-----+---------+--------+ PTA     95                0.75 biphasic          +--------+------------------+-----+---------+--------+ DP      92                0.73 biphasic          +--------+------------------+-----+---------+--------+ +--------+------------------+-----+---------+-------+ Left    Lt Pressure (mmHg)IndexWaveform Comment +--------+------------------+-----+---------+-------+ [id number]                    triphasic        +--------+------------------+-----+---------+-------+ PTA     127               1.01 biphasic         +--------+------------------+-----+---------+-------+ DP      126               1.00 biphasic         +--------+------------------+-----+---------+-------+  Right  Doppler Findings: +--------+--------+-----+---------+--------+ Site    PressureIndexDoppler  Comments +--------+--------+-----+---------+--------+ Brachial             triphasicPICC     +--------+--------+-----+---------+--------+ Radial               triphasic         +--------+--------+-----+---------+--------+ Ulnar                triphasic         +--------+--------+-----+---------+--------+  Left Doppler Findings: +--------+--------+-----+---------+--------+ Site    PressureIndexDoppler  Comments +--------+--------+-----+---------+--------+ [id number]          triphasic         +--------+--------+-----+---------+--------+ Radial               triphasic         +--------+--------+-----+---------+--------+ Ulnar                triphasic         +--------+--------+-----+---------+--------+  Summary: Right Carotid: Evidence consistent with a total occlusion of the right ICA. Left Carotid: Velocities in the left ICA are consistent with a 40-59% stenosis. Vertebrals:  Left vertebral artery demonstrates retrograde flow. Right vertebral              artery demonstrates an occlusion. Subclavians: Normal flow hemodynamics were seen in bilateral subclavian              arteries. Right ABI: Resting right ankle-brachial index indicates moderate right lower extremity arterial disease. Left ABI: Resting left ankle-brachial index is within normal range. No evidence of significant left lower extremity arterial disease. Right Upper Extremity: Doppler waveforms remain within normal limits with right radial compression. Doppler waveforms decrease >50% with right ulnar compression. Left Upper Extremity: Doppler waveforms remain within normal limits with left radial compression. Doppler waveforms remain within normal limits with left ulnar compression.  Electronically signed by Servando Snare MD on 01/26/2020 at 7:08:37 PM.    Final    [place] EKG SITE RITE  Result Date: 01/23/2020 If  Site Rite image not attached, placement could not be confirmed due to current cardiac rhythm.    Discharge Medications: Allergies as of 02/04/2020   No Known Allergies     Medication List    STOP taking these medications   ibuprofen 200 MG tablet Commonly known as: ADVIL   lisinopril 2.5 MG tablet Commonly known as: ZESTRIL   metFORMIN 500 MG tablet Commonly known as: GLUCOPHAGE   nitroGLYCERIN 0.4 MG SL tablet Commonly known as: NITROSTAT   Praluent 150 MG/ML Soaj Generic drug: Alirocumab   torsemide 20 MG tablet Commonly known as: DEMADEX     TAKE these medications   aspirin EC 81 MG tablet Take 81 mg by mouth daily.   atorvastatin 80 MG tablet Commonly known as: LIPITOR Take 1 tablet (80 mg total) by mouth daily.   blood glucose meter kit and supplies Kit Inject 1 each into the skin 4 (four) times daily as needed. Dispense based on patient and insurance preference. Use up to four times daily as directed. (FOR ICD-9 250.00, 250.01).   carvedilol 3.125 MG tablet Commonly known as: COREG Take 1 tablet (3.125 mg total) by mouth 2 (two) times daily with a meal. What changed:   medication strength  how much to take  when to take this  additional instructions   clopidogrel 75 MG tablet Commonly known as: PLAVIX TAKE 1 TABLET BY MOUTH EVERY DAY IN THE EVENING   colchicine 0.6 MG tablet Take 0.5  tablets (0.3 mg total) by mouth 2 (two) times daily. For 2 weeks then stop.   DULoxetine 60 MG capsule Commonly known as: CYMBALTA Take 1 capsule (60 mg total) by mouth daily.   furosemide 80 MG tablet Commonly known as: Lasix Take 1 tablet (80 mg total) by mouth 2 (two) times daily.   Insulin Pen Needle 31G X 8 MM Misc Commonly known as: B-D ULTRAFINE III SHORT PEN USE TO INJECT INSULIN DAILY DX E11.9   isosorbide dinitrate 10 MG tablet Commonly known as: ISORDIL Take 1 tablet (10 mg total) by mouth 2 (two) times daily. Please take until finished     mirtazapine 7.5 MG tablet Commonly known as: REMERON TAKE 1 TABLET AT BEDTIME FOR DEPRESSION   OneTouch Delica Lancets 03U Misc Test blood sugars three times daily   OneTouch Verio test strip Generic drug: glucose blood 1 each by Other route in the morning, at noon, in the evening, and at bedtime. use for testing   oxyCODONE 5 MG immediate release tablet Commonly known as: Oxy IR/ROXICODONE Take 1 tablet (5 mg total) by mouth every 4 (four) hours as needed for severe pain.   Ozempic (0.25 or 0.5 MG/DOSE) 2 MG/1.5ML Sopn Generic drug: Semaglutide(0.25 or 0.5MG/DOS) Inject 0.5 mg into the skin once a week. What changed: when to take this   potassium chloride 10 MEQ tablet Commonly known as: KLOR-CON Take 1 tablet (10 mEq total) by mouth daily. What changed:   medication strength  how much to take   primidone 50 MG tablet Commonly known as: MYSOLINE TAKE 1 TABLET BY MOUTH EVERYDAY AT BEDTIME What changed: See the new instructions.   tamsulosin 0.4 MG Caps capsule Commonly known as: FLOMAX TAKE 1 CAPSULE DAILY IN THE EVENING   topiramate 25 MG tablet Commonly known as: Topamax Take 1 tablet (25 mg total) by mouth at bedtime as needed.   Tyler Aas FlexTouch 200 UNIT/ML FlexTouch Pen Generic drug: insulin degludec Inject 116 Units into the skin daily. What changed: how much to take     The patient has been discharged on:   1.Beta Blocker:  Yes [  x ]                              No   [   ]                              If No, reason:  2.Ace Inhibitor/ARB: Yes [   ]                                     No  [  x  ]                                     If No, reason:AKI  3.Statin:   Yes [ x  ]                  No  [   ]                  If No, reason:  4.Ecasa:  Yes  [ x  ]  No   [   ]                  If No, reason:  Follow Up Appointments:  Follow-up Information    Wonda Olds, MD. Go on 02/13/2020.   Specialty: Cardiothoracic  Surgery Why: Appointment time is at 2:00 pm Contact information: [address] [place] [address] [phone]        [name], Fransisca Kaufmann, MD. Call.   Specialties: Family Medicine, Cardiology Why: for a follow up appointment regarding further diabetes management and surveillance of HGA1C 13.8 Contact information: [place] [address] [phone]        Richardson Dopp T, PA-C Follow up on 02/22/2020.   Specialties: Cardiology, Physician Assistant Why: Please arrive 15 minutes early for your 8:45am post-hospital cardiology follow-up appointment. Please note, this is at our [facility] location due to limited appointment availability at our [facility] office.  Contact information: [address] [address] [phone]        [facility] [place]. Go on 02/09/2020.   Specialty: Cardiothoracic Surgery Why: Appointment is with nurse for sternal and LUE staple removal. Appointment time is at  Contact information: [address] [place], [place] [phone]              Signed: Ellamae Sia 02/04/2020, 10:24 AM

## 2020-01-30 NOTE — Progress Notes (Signed)
Patient's ICD per patient has been alarming off and on since surgery.  Dr. Vickey Sages called and notified of this and is to call back to let RN know if he wants ICD turned back on.  [employer] notified as well.

## 2020-01-30 NOTE — Progress Notes (Signed)
Inpatient Diabetes Program Recommendations  AACE/ADA: New Consensus Statement on Inpatient Glycemic Control (2015)  Target Ranges:  Prepandial:   less than 140 mg/dL      Peak postprandial:   less than 180 mg/dL (1-2 hours)      Critically ill patients:  140 - 180 mg/dL   Lab Results  Component Value Date   GLUCAP 186 (H) 01/30/2020   HGBA1C 13.8 (H) 01/27/2020    Review of Glycemic Control Results for KUE, FOX (MRN [id number]) as of 01/30/2020 09:30  Ref. Range 01/29/2020 19:36 01/29/2020 23:39 01/30/2020 03:48 01/30/2020 06:38 01/30/2020 08:08  Glucose-Capillary Latest Ref Range: 70 - 99 mg/dL 299 (H) 371 (H) 79 68 (L) 186 (H)   Diabetes history: DM 2 Outpatient Diabetes medications:  Tresiba 160 units daily Metformin 500 mg bid Current orders for Inpatient glycemic control: Lantus 20 units bid + Novolog 0-24 units q 4 hrs.  Inpatient Diabetes Program Recommendations:   Consider: -Decrease Novolog correction to tid since patient is eating -Add hs Novolog correction 0-5 units  I plan to speak with patient again today and review need for glycemic control and diabetes management  On discharge: -Tresiba insulin 40 qd  -Metformin 500 mg bid -Ozempic 0.5 q14days -Followup within a week with physician caring for his diabetes  Thank you, Billy Fischer. Atiyana Welte, RN, MSN, CDE  Diabetes Coordinator Inpatient Glycemic Control Team Team Pager [phone] (8am-5pm) 01/30/2020 9:34 AM

## 2020-01-31 ENCOUNTER — Inpatient Hospital Stay (HOSPITAL_COMMUNITY): Payer: Medicare Other

## 2020-01-31 LAB — GLUCOSE, CAPILLARY
Glucose-Capillary: 73 mg/dL (ref 70–99)
Glucose-Capillary: 187 mg/dL — ABNORMAL HIGH (ref 70–99)
Glucose-Capillary: 112 mg/dL — ABNORMAL HIGH (ref 70–99)
Glucose-Capillary: 146 mg/dL — ABNORMAL HIGH (ref 70–99)
Glucose-Capillary: 170 mg/dL — ABNORMAL HIGH (ref 70–99)

## 2020-01-31 LAB — CBC
HCT: 23.2 % — ABNORMAL LOW (ref 39.0–52.0)
Hemoglobin: 7.2 g/dL — ABNORMAL LOW (ref 13.0–17.0)
MCH: 27.9 pg (ref 26.0–34.0)
MCHC: 31 g/dL (ref 30.0–36.0)
MCV: 89.9 fL (ref 80.0–100.0)
Platelets: 215 10*3/uL (ref 150–400)
RBC: 2.58 MIL/uL — ABNORMAL LOW (ref 4.22–5.81)
RDW: 13.2 % (ref 11.5–15.5)
WBC: 14.7 10*3/uL — ABNORMAL HIGH (ref 4.0–10.5)
nRBC: 0 % (ref 0.0–0.2)

## 2020-01-31 LAB — COOXEMETRY PANEL
Carboxyhemoglobin: 1.6 % — ABNORMAL HIGH (ref 0.5–1.5)
Methemoglobin: 0.6 % (ref 0.0–1.5)
O2 Saturation: 51 %
Total hemoglobin: 7.3 g/dL — ABNORMAL LOW (ref 12.0–16.0)

## 2020-01-31 LAB — BASIC METABOLIC PANEL
Anion gap: 8 (ref 5–15)
BUN: 44 mg/dL — ABNORMAL HIGH (ref 6–20)
CO2: 25 mmol/L (ref 22–32)
Calcium: 7.4 mg/dL — ABNORMAL LOW (ref 8.9–10.3)
Chloride: 95 mmol/L — ABNORMAL LOW (ref 98–111)
Creatinine, Ser: 2.13 mg/dL — ABNORMAL HIGH (ref 0.61–1.24)
GFR calc Af Amer: 40 mL/min — ABNORMAL LOW (ref >60)
GFR calc non Af Amer: 34 mL/min — ABNORMAL LOW (ref >60)
Glucose, Bld: 113 mg/dL — ABNORMAL HIGH (ref 70–99)
Potassium: 4 mmol/L (ref 3.5–5.1)
Sodium: 128 mmol/L — ABNORMAL LOW (ref 135–145)

## 2020-01-31 MED ORDER — COLCHICINE 0.3 MG HALF TABLET
0.3000 mg | ORAL_TABLET | Freq: Two times a day (BID) | ORAL | Status: DC
  Administered 2020-01-31 – 2020-02-04 (×9): 0.3 mg via ORAL
  Filled 2020-01-31 (×11): qty 1

## 2020-01-31 MED ORDER — ASPIRIN EC 81 MG PO TBEC
81.0000 mg | DELAYED_RELEASE_TABLET | Freq: Every day | ORAL | Status: DC
  Administered 2020-01-31 – 2020-02-04 (×5): 81 mg via ORAL
  Filled 2020-01-31 (×5): qty 1

## 2020-01-31 MED ORDER — DIAZEPAM 2 MG PO TABS
2.0000 mg | ORAL_TABLET | Freq: Three times a day (TID) | ORAL | Status: DC | PRN
  Administered 2020-01-31 – 2020-02-03 (×3): 2 mg via ORAL
  Filled 2020-01-31 (×4): qty 1

## 2020-01-31 MED ORDER — INSULIN GLARGINE 100 UNIT/ML ~~LOC~~ SOLN
16.0000 [IU] | Freq: Two times a day (BID) | SUBCUTANEOUS | Status: DC
  Administered 2020-01-31 (×2): 16 [IU] via SUBCUTANEOUS
  Filled 2020-01-31 (×6): qty 0.16

## 2020-01-31 MED ORDER — INSULIN ASPART 100 UNIT/ML ~~LOC~~ SOLN
0.0000 [IU] | Freq: Three times a day (TID) | SUBCUTANEOUS | Status: DC
  Administered 2020-01-31: 4 [IU] via SUBCUTANEOUS
  Administered 2020-01-31: 2 [IU] via SUBCUTANEOUS
  Administered 2020-01-31: 4 [IU] via SUBCUTANEOUS

## 2020-01-31 MED ORDER — CLOPIDOGREL BISULFATE 75 MG PO TABS
75.0000 mg | ORAL_TABLET | Freq: Every day | ORAL | Status: DC
  Administered 2020-01-31 – 2020-02-04 (×5): 75 mg via ORAL
  Filled 2020-01-31 (×5): qty 1

## 2020-01-31 MED ORDER — ISOSORBIDE DINITRATE 10 MG PO TABS
10.0000 mg | ORAL_TABLET | Freq: Three times a day (TID) | ORAL | Status: DC
  Administered 2020-01-31 (×2): 10 mg via ORAL
  Filled 2020-01-31 (×3): qty 1

## 2020-01-31 MED FILL — Magnesium Sulfate Inj 50%: INTRAMUSCULAR | Qty: 10 | Status: AC

## 2020-01-31 MED FILL — Potassium Chloride Inj 2 mEq/ML: INTRAVENOUS | Qty: 40 | Status: AC

## 2020-01-31 MED FILL — Heparin Sodium (Porcine) Inj 1000 Unit/ML: INTRAMUSCULAR | Qty: 30 | Status: AC

## 2020-01-31 NOTE — Progress Notes (Signed)
Physical Therapy Treatment Patient Details Name: Douglas Carr MRN: [id number] DOB: [birth year] Today's Date: 01/31/2020    History of Present Illness 54 y.o. male with medical history significant of ischemic cardiomyopathy, systolic heart failure, CVA with L-sided hemiparesis, type 2 diabetes mellitus, dyslipidemia, hypertension and anxiety. Presents to ED with dyspnea and chest pain over 4 day period. Admitted 01/22/20 for treatment of Acute decompensated systolic heart failure, complicated by acute cardiogenic pulmonary edema (acute hypoxic respiratory failure and NSTEMI. Pt s/p 9/21 heart catheterization, and 9/24 CABGx5    PT Comments    Pt sitting in chair visiting with friend from [employer]. Pt reports having a hard time when nursing attempted to get him up prior to 5am but doing ok since then. Pt with significantly improved transfers and mobility able to stand with limited assist and progress hall ambulation. Pt normally walks with cane and is reliant on slow, cautious gait with RW with assist for balance as pt slowly leans and veers left with fatigue during gait. Pt would greatly benefit from intensive CIR to return home safely with family support. Pt able to state sternal precautions other than "move in tube" and provided handout for all. Pt encouraged to continue to mobilize with nursing staff.   HR 93-100 SpO2 89-92% on RA Pre gait 129/71 (85) Post gait 125/73 (89)   Follow Up Recommendations  Supervision/Assistance - 24 hour;CIR     Equipment Recommendations  None recommended by PT    Recommendations for Other Services       Precautions / Restrictions Precautions Precautions: Sternal;Fall Precaution Booklet Issued: Yes (comment)    Mobility  Bed Mobility Overal bed mobility: Needs Assistance Bed Mobility: Sit to Sidelying         Sit to sidelying: Supervision General bed mobility comments: supervision for lines with pt able to achieve sidelying without  physical assist  Transfers Overall transfer level: Needs assistance   Transfers: Sit to/from Stand Sit to Stand: Min guard         General transfer comment: minguard to rise from recliner x 2 and to bed  Ambulation/Gait Ambulation/Gait assistance: Min assist;+2 safety/equipment Gait Distance (Feet): 60 Feet Assistive device: Rolling walker (2 wheeled) Gait Pattern/deviations: Step-through pattern;Decreased stride length;Trunk flexed   Gait velocity interpretation: 1.31 - 2.62 ft/sec, indicative of limited community ambulator General Gait Details: cues for posture, increased stride and BOS, close chair follow. pt able to walk 60' x 2 trials with seated rest. pt with very short step length   Stairs             Wheelchair Mobility    Modified Rankin (Stroke Patients Only)       Balance Overall balance assessment: Needs assistance   Sitting balance-Leahy Scale: Good     Standing balance support: Bilateral upper extremity supported;During functional activity;No upper extremity supported Standing balance-Leahy Scale: Poor                              Cognition Arousal/Alertness: Awake/alert Behavior During Therapy: WFL for tasks assessed/performed Overall Cognitive Status: Within Functional Limits for tasks assessed                                        Exercises General Exercises - Lower Extremity Heel Slides: AROM;Both;10 reps;Supine Hip ABduction/ADduction: AROM;Supine;Both;10 reps    General Comments  Pertinent Vitals/Pain Pain Score: 6  Pain Location: chest with coughing and mobility Pain Descriptors / Indicators: Sore;Aching Pain Intervention(s): Limited activity within patient's tolerance;Monitored during session;Repositioned    Home Living                      Prior Function            PT Goals (current goals can now be found in the care plan section) Progress towards PT goals: Progressing  toward goals    Frequency    Min 3X/week      PT Plan Discharge plan needs to be updated;Frequency needs to be updated    Co-evaluation              AM-PAC PT "6 Clicks" Mobility   Outcome Measure  Help needed turning from your back to your side while in a flat bed without using bedrails?: A Little Help needed moving from lying on your back to sitting on the side of a flat bed without using bedrails?: A Little Help needed moving to and from a bed to a chair (including a wheelchair)?: A Little Help needed standing up from a chair using your arms (e.g., wheelchair or bedside chair)?: A Little Help needed to walk in hospital room?: A Little Help needed climbing 3-5 steps with a railing? : A Lot 6 Click Score: 17    End of Session Equipment Utilized During Treatment: Gait belt Activity Tolerance: Patient tolerated treatment well Patient left: in bed;with call bell/phone within reach;with family/visitor present Nurse Communication: Mobility status PT Visit Diagnosis: Other abnormalities of gait and mobility (R26.89);Difficulty in walking, not elsewhere classified (R26.2)     Time: 9924-2683 PT Time Calculation (min) (ACUTE ONLY): 26 min  Charges:  $Gait Training: 8-22 mins $Therapeutic Exercise: 8-22 mins                     Sender Rueb P, PT Acute Rehabilitation Services Pager: [phone] Office: [phone]    Siani Utke B Dortha Neighbors 01/31/2020, 1:21 PM

## 2020-01-31 NOTE — Progress Notes (Addendum)
[facility] DAILY ICU PROGRESS NOTE                   [address]            [place] [address]          [phone]   4 Days Post-Op Procedure(s) (LRB): CORONARY ARTERY BYPASS GRAFTING (CABG), ON PUMP, TIMES FIVE, USING BILATERAL MAMMARY ARTERIES, LEFT RADIAL ARTERY, AND ENDOSCOPICALLY HARVESTED RIGHT GREATER SAPHENOUS VEIN. (N/A) RADIAL ARTERY HARVEST (Left) TRANSESOPHAGEAL ECHOCARDIOGRAM (TEE) (N/A) INDOCYANINE GREEN FLUORESCENCE IMAGING (ICG) (N/A) ENDOVEIN HARVEST OF GREATER SAPHENOUS VEIN (Right) CLIPPING OF ATRIAL APPENDAGE USING ATRICURE 35 MM ATRICLIP (Left)  Total Length of Stay:  LOS: 9 days   Subjective: Patient sitting in chair this am eating fruit. He has no specific complaint.  Objective: Vital signs in last 24 hours: Temp:  [98 F (36.7 C)-98.7 F (37.1 C)] 98.2 F (36.8 C) (09/28 0417) Pulse Rate:  [84-101] 88 (09/28 0630) Cardiac Rhythm: Normal sinus rhythm (09/28 0400) Resp:  [7-23] 18 (09/28 0630) BP: (93-154)/(39-81) 114/71 (09/28 0600) SpO2:  [90 %-98 %] 92 % (09/28 0630) Weight:  [96.3 kg] 96.3 kg (09/28 0500)  Filed Weights   01/29/20 0500 01/30/20 0500 01/31/20 0500  Weight: 96.4 kg 96.5 kg 96.3 kg    Weight change: -0.2 kg   Hemodynamic parameters for last 24 hours: CVP:  [1 mmHg-17 mmHg] 15 mmHg  Intake/Output from previous day: 09/27 0701 - 09/28 0700 In: 348 [I.V.:348] Out: 2160 [Urine:2090; Chest Tube:70]  Intake/Output this shift: No intake/output data recorded.  Current Meds: Scheduled Meds: . acetaminophen  1,000 mg Oral Q6H   Or  . acetaminophen (TYLENOL) oral liquid 160 mg/5 mL  1,000 mg Per Tube Q6H  . aspirin EC  325 mg Oral Daily   Or  . aspirin  324 mg Per Tube Daily  . atorvastatin  80 mg Oral Daily  . bisacodyl  10 mg Oral Daily   Or  . bisacodyl  10 mg Rectal Daily  . carvedilol  3.125 mg Oral BID WC  . Chlorhexidine Gluconate Cloth  6 each Topical Q0600  . Chlorhexidine Gluconate Cloth  6 each  Topical Daily  . docusate sodium  200 mg Oral Daily  . DULoxetine  60 mg Oral Daily  . furosemide  40 mg Intravenous Daily  . insulin aspart  0-24 Units Subcutaneous Q4H  . insulin glargine  20 Units Subcutaneous BID  . isosorbide dinitrate  5 mg Oral BID  . mouth rinse  15 mL Mouth Rinse BID  . mirtazapine  7.5 mg Oral QHS  . mupirocin ointment  1 application Nasal BID  . pantoprazole  40 mg Oral Daily  . primidone  50 mg Oral QHS  . sodium chloride flush  10-40 mL Intracatheter Q12H  . sodium chloride flush  3 mL Intravenous Q12H  . sodium chloride flush  3 mL Intravenous Q12H  . tamsulosin  0.4 mg Oral QPC supper   Continuous Infusions: . sodium chloride Stopped (01/28/20 1346)  . sodium chloride Stopped (01/24/20 1335)  . sodium chloride    . sodium chloride    . sodium chloride 10 mL/hr at 01/31/20 0635  . epinephrine Stopped (01/30/20 1526)  . insulin Stopped (01/28/20 1410)  . lactated ringers Stopped (01/28/20 1357)  . lactated ringers Stopped (01/29/20 1721)  . milrinone 0.25 mcg/kg/min (01/31/20 0635)   PRN Meds:.sodium chloride, sodium chloride, sodium chloride, dextrose, diazepam, morphine injection, ondansetron (ZOFRAN) IV, oxyCODONE,  sodium chloride flush, sodium chloride flush, sodium chloride flush, traMADol  General appearance: alert, cooperative and no distress Neurologic: intact Heart: RRR Lungs: Slightly dimished bibasilar breath sounds Abdomen: Soft, non tender, bowel sounds present Extremities: Mild LE edema. Motor/sensory intact LUE Wound: Sternal wound and wound is clean and dry. LUE  wound is clean and dry.  Lab Results: CBC: Recent Labs    01/30/20 0316 01/31/20 0356  WBC 13.8* 14.7*  HGB 7.3* 7.2*  HCT 23.4* 23.2*  PLT 161 215   BMET:  Recent Labs    01/30/20 1934 01/31/20 0356  NA 129* 128*  K 4.6 4.0  CL 96* 95*  CO2 25 25  GLUCOSE 122* 113*  BUN 44* 44*  CREATININE 2.14* 2.13*  CALCIUM 7.7* 7.4*    CMET: Lab Results    Component Value Date   WBC 14.7 (H) 01/31/2020   HGB 7.2 (L) 01/31/2020   HCT 23.2 (L) 01/31/2020   PLT 215 01/31/2020   GLUCOSE 113 (H) 01/31/2020   CHOL 150 09/27/2019   TRIG 261 (H) 09/27/2019   HDL 34 (L) 09/27/2019   LDLCALC 73 09/27/2019   ALT 38 01/22/2020   AST 28 01/22/2020   NA 128 (L) 01/31/2020   K 4.0 01/31/2020   CL 95 (L) 01/31/2020   CREATININE 2.13 (H) 01/31/2020   BUN 44 (H) 01/31/2020   CO2 25 01/31/2020   TSH 3.503 05/07/2016   INR 1.5 (H) 01/27/2020   HGBA1C 13.8 (H) 01/27/2020   MICROALBUR neg 10/20/2014    PT/INR:  No results for input(s): LABPROT, INR in the last 72 hours. Radiology: DG Chest 1 View  Result Date: 01/31/2020 CLINICAL DATA:  Pleural effusion.  Open heart surgery. EXAM: CHEST  1 VIEW COMPARISON:  01/30/2020 FINDINGS: Postoperative changes in the mediastinum. Cardiac pacemaker. Right PICC line is unchanged in position. Right chest tube is been removed. Cardiac enlargement. Probable left pleural effusion with atelectasis in the left base. Calcified granulomas in the right mid lung. No residual pneumothorax. IMPRESSION: Interval removal of right chest tube. No residual pneumothorax. Electronically Signed   By: Burman Nieves M.D.   On: 01/31/2020 06:27   Assessment/Plan: S/P Procedure(s) (LRB): CORONARY ARTERY BYPASS GRAFTING (CABG), ON PUMP, TIMES FIVE, USING BILATERAL MAMMARY ARTERIES, LEFT RADIAL ARTERY, AND ENDOSCOPICALLY HARVESTED RIGHT GREATER SAPHENOUS VEIN. (N/A) RADIAL ARTERY HARVEST (Left) TRANSESOPHAGEAL ECHOCARDIOGRAM (TEE) (N/A) INDOCYANINE GREEN FLUORESCENCE IMAGING (ICG) (N/A) ENDOVEIN HARVEST OF GREATER SAPHENOUS VEIN (Right) CLIPPING OF ATRIAL APPENDAGE USING ATRICURE 35 MM ATRICLIP (Left)   1. CV-S/p NSTEMI. SR with HR in the 90's and hypertensive. On Isordil 5 mg tid, Coreg 3.125 mg bid, Milrinone drip at 0.25. Co ox this am decreased to 51. Will increase Isordil. No ACE yet as creatinine still elevated. 2. Pulmonary-on  room air this am.  CXR this am appears stable (cardiomegaly, no pneumothorax, small left pleural effusion/atelectasis.) Encourage incentive spirometer. 3. Volume overload-on Lasix 40 mg IV daily 4. Expected post op blood loss anemia-H and H this am decreased to 7.2 and 23.2. Monitor need for transfusion. 5. DM-CBGs 110/81/73 . Pre op HGA1C 13.8. Per diabetes coordinator: will decrease Novolog correction to tid since patient is eating and add hs Novolog correction 0-5 units. Will need close medical follow up after discharge 6. Creatinine this am slightly decreased to 2.13. Re check in am. 7. Hyponatremia-sodium 128. Likely related to diuresis  Price Lachapelle Margaretann Loveless PA-C 01/31/2020 7:25 AM

## 2020-01-31 NOTE — Progress Notes (Signed)
Inpatient Rehab Admissions Coordinator Note:   Per therapy recommendations, pt was screened for CIR candidacy by Estill Dooms, PT, DPT.  At this time we are recommending a CIR consult. I will contact MD for an order.  Please contact me with questions.   Estill Dooms, PT, DPT [phone] 01/31/20 4:19 PM

## 2020-01-31 NOTE — Plan of Care (Signed)
  Problem: Education: Goal: Understanding of CV disease, CV risk reduction, and recovery process will improve Outcome: Progressing   Problem: Cardiovascular: Goal: Ability to achieve and maintain adequate cardiovascular perfusion will improve Outcome: Progressing   Problem: Education: Goal: Knowledge of General Education information will improve Description: Including pain rating scale, medication(s)/side effects and non-pharmacologic comfort measures Outcome: Progressing   Problem: Health Behavior/Discharge Planning: Goal: Ability to manage health-related needs will improve Outcome: Progressing   Problem: Clinical Measurements: Goal: Diagnostic test results will improve Outcome: Progressing   Problem: Clinical Measurements: Goal: Cardiovascular complication will be avoided Outcome: Progressing   Problem: Activity: Goal: Risk for activity intolerance will decrease Outcome: Progressing   Problem: Nutrition: Goal: Adequate nutrition will be maintained Outcome: Progressing   Problem: Pain Managment: Goal: General experience of comfort will improve Outcome: Progressing   Problem: Skin Integrity: Goal: Risk for impaired skin integrity will decrease Outcome: Progressing   Problem: Education: Goal: Ability to verbalize understanding of medication therapies will improve Outcome: Progressing   Problem: Education: Goal: Knowledge of disease or condition will improve Outcome: Progressing   Problem: Education: Goal: Knowledge of the prescribed therapeutic regimen will improve Outcome: Progressing   Problem: Clinical Measurements: Goal: Postoperative complications will be avoided or minimized Outcome: Progressing   Problem: Skin Integrity: Goal: Wound healing without signs and symptoms of infection Outcome: Progressing

## 2020-02-01 ENCOUNTER — Inpatient Hospital Stay (HOSPITAL_COMMUNITY): Payer: Medicare Other

## 2020-02-01 ENCOUNTER — Telehealth: Payer: Self-pay | Admitting: Physician Assistant

## 2020-02-01 LAB — CBC
HCT: 23.7 % — ABNORMAL LOW (ref 39.0–52.0)
Hemoglobin: 7.7 g/dL — ABNORMAL LOW (ref 13.0–17.0)
MCH: 28.7 pg (ref 26.0–34.0)
MCHC: 32.5 g/dL (ref 30.0–36.0)
MCV: 88.4 fL (ref 80.0–100.0)
Platelets: 293 10*3/uL (ref 150–400)
RBC: 2.68 MIL/uL — ABNORMAL LOW (ref 4.22–5.81)
RDW: 13.1 % (ref 11.5–15.5)
WBC: 13.6 10*3/uL — ABNORMAL HIGH (ref 4.0–10.5)
nRBC: 0 % (ref 0.0–0.2)

## 2020-02-01 LAB — BASIC METABOLIC PANEL
Anion gap: 7 (ref 5–15)
BUN: 47 mg/dL — ABNORMAL HIGH (ref 6–20)
CO2: 26 mmol/L (ref 22–32)
Calcium: 7.5 mg/dL — ABNORMAL LOW (ref 8.9–10.3)
Chloride: 95 mmol/L — ABNORMAL LOW (ref 98–111)
Creatinine, Ser: 2 mg/dL — ABNORMAL HIGH (ref 0.61–1.24)
GFR calc Af Amer: 43 mL/min — ABNORMAL LOW (ref >60)
GFR calc non Af Amer: 37 mL/min — ABNORMAL LOW (ref >60)
Glucose, Bld: 78 mg/dL (ref 70–99)
Potassium: 4.1 mmol/L (ref 3.5–5.1)
Sodium: 128 mmol/L — ABNORMAL LOW (ref 135–145)

## 2020-02-01 LAB — GLUCOSE, CAPILLARY
Glucose-Capillary: 58 mg/dL — ABNORMAL LOW (ref 70–99)
Glucose-Capillary: 80 mg/dL (ref 70–99)
Glucose-Capillary: 131 mg/dL — ABNORMAL HIGH (ref 70–99)
Glucose-Capillary: 107 mg/dL — ABNORMAL HIGH (ref 70–99)
Glucose-Capillary: 124 mg/dL — ABNORMAL HIGH (ref 70–99)

## 2020-02-01 MED ORDER — ISOSORBIDE DINITRATE 10 MG PO TABS
10.0000 mg | ORAL_TABLET | Freq: Two times a day (BID) | ORAL | Status: DC
  Administered 2020-02-01 – 2020-02-04 (×7): 10 mg via ORAL
  Filled 2020-02-01 (×7): qty 1

## 2020-02-01 MED ORDER — LACTULOSE 10 GM/15ML PO SOLN
20.0000 g | Freq: Once | ORAL | Status: AC
  Administered 2020-02-01: 20 g via ORAL
  Filled 2020-02-01: qty 30

## 2020-02-01 MED ORDER — LIVING WELL WITH DIABETES BOOK
Freq: Once | Status: AC
  Filled 2020-02-01: qty 1

## 2020-02-01 MED ORDER — INSULIN GLARGINE 100 UNIT/ML ~~LOC~~ SOLN
13.0000 [IU] | Freq: Two times a day (BID) | SUBCUTANEOUS | Status: DC
  Administered 2020-02-01 – 2020-02-04 (×7): 13 [IU] via SUBCUTANEOUS
  Filled 2020-02-01 (×8): qty 0.13

## 2020-02-01 MED ORDER — INSULIN ASPART 100 UNIT/ML ~~LOC~~ SOLN: 0.0000 [IU] | Freq: Three times a day (TID) | SUBCUTANEOUS | Status: DC

## 2020-02-01 MED ORDER — INSULIN ASPART 100 UNIT/ML ~~LOC~~ SOLN
0.0000 [IU] | Freq: Three times a day (TID) | SUBCUTANEOUS | Status: DC
  Administered 2020-02-02 – 2020-02-03 (×2): 1 [IU] via SUBCUTANEOUS
  Administered 2020-02-03 (×2): 2 [IU] via SUBCUTANEOUS

## 2020-02-01 MED FILL — Mannitol IV Soln 20%: INTRAVENOUS | Qty: 500 | Status: AC

## 2020-02-01 MED FILL — Electrolyte-R (PH 7.4) Solution: INTRAVENOUS | Qty: 5000 | Status: AC

## 2020-02-01 MED FILL — Sodium Chloride IV Soln 0.9%: INTRAVENOUS | Qty: 3000 | Status: AC

## 2020-02-01 MED FILL — Heparin Sodium (Porcine) Inj 1000 Unit/ML: INTRAMUSCULAR | Qty: 20 | Status: AC

## 2020-02-01 MED FILL — Sodium Bicarbonate IV Soln 8.4%: INTRAVENOUS | Qty: 50 | Status: AC

## 2020-02-01 NOTE — Progress Notes (Signed)
   01/31/20 1635  Clinical Encounter Type  Visited With Health care provider  Visit Type Initial  Consult/Referral To Chaplain  Chaplain spoke with nurse and will follow up on spiritual care consult for advance directive tomorrow. The patient was transferred to the floor today and this will give him time to get settled. Chaplain will follow up as needed.

## 2020-02-01 NOTE — Progress Notes (Signed)
IP rehab admissions - I met with patient and his friend at the bedside today.  Patient would like to discharge directly home from hospital and have [facility] services.  He feels he can progress better at home and he does not want CIR at this time.  I did leave him pamphlets and discuss CIR with him.  I will call his wife to update her on our discussion.  Call me for questions.  [phone]

## 2020-02-01 NOTE — Progress Notes (Signed)
   02/01/20 1000  Clinical Encounter Type  Visited With Patient  Visit Type Follow-up  Referral From Nurse  Consult/Referral To Chaplain  Spiritual Encounters  Spiritual Needs Prayer  Chaplain met with the patient to discuss AD. Chaplain gave patient information concerning the AD and briefly described the difference between POA and Living Will. Chaplain will talk with the patient and his wife if they have questions. Chaplain offered a prayer of healing for the patient based on his request. Chaplain provided spiritual support. Chaplain will follow up as needed.

## 2020-02-01 NOTE — Progress Notes (Addendum)
Inpatient Diabetes Program Recommendations  AACE/ADA: New Consensus Statement on Inpatient Glycemic Control (2015)  Target Ranges:  Prepandial:   less than 140 mg/dL      Peak postprandial:   less than 180 mg/dL (1-2 hours)      Critically ill patients:  140 - 180 mg/dL   Lab Results  Component Value Date   GLUCAP 80 02/01/2020   HGBA1C 13.8 (H) 01/27/2020    Review of Glycemic Control Results for MARKS, [name] (MRN [id number]) as of 02/01/2020 09:32  Ref. Range 01/31/2020 11:43 01/31/2020 15:21 01/31/2020 21:34 02/01/2020 06:43 02/01/2020 07:01  Glucose-Capillary Latest Ref Range: 70 - 99 mg/dL 361 (H) 443 (H) Novolog 2 units 170 (H) Novolog 4 units 58 (L) 80   Diabetes history:DM 2 Outpatient Diabetes medications:(Not taking 2-3 months) Tresiba 160 units daily Metformin 500 mg bid Current orders for Inpatient glycemic control:Lantus 13 units bid + Novolog 0-24 units tid & hs.  Inpatient Diabetes Program Recommendations:    Patient had CBG 73 @ 0415 am today. Noted Lantus decreased to 13 units bid -Decrease Novolog correction to 0-9 units tid + hs 0-5  Discharge diabetes medications will need to be adjusted. -Consider Evaristo Bury 20 units daily  Secure chat sent to [employer] PA-C. Spoke with patient regarding diabetes management. Patient states he was constantly craving sweets when he was taking Tresiba 160 units and decided to quit his diabetes medications without speaking to a doctor.Patient drinks mostly water @ home but does eat more carbohydrates than he feels he is supposed to so reviewed basic nutrition principles with patient. Patient shares that he wants to do everything he can for his health moving forward.  Thank you, Douglas Carr. Lorcan Shelp, RN, MSN, CDE  Diabetes Coordinator Inpatient Glycemic Control Team Team Pager [phone] (8am-5pm) 02/01/2020 10:01 AM   Thank you, Douglas Carr. Richar Dunklee, RN, MSN, CDE  Diabetes Coordinator Inpatient Glycemic Control  Team Team Pager [phone] (8am-5pm) 02/01/2020 9:38 AM

## 2020-02-01 NOTE — Telephone Encounter (Signed)
TOC patient   Please call patient patient has a apt [name] on 02/22/20

## 2020-02-01 NOTE — Plan of Care (Signed)
  Problem: Education: Goal: Understanding of CV disease, CV risk reduction, and recovery process will improve Outcome: Progressing Goal: Individualized Educational Video(s) Outcome: Progressing   Problem: Cardiovascular: Goal: Ability to achieve and maintain adequate cardiovascular perfusion will improve Outcome: Progressing Goal: Vascular access site(s) Level 0-1 will be maintained Outcome: Completed/Met   Problem: Education: Goal: Knowledge of General Education information will improve Description: Including pain rating scale, medication(s)/side effects and non-pharmacologic comfort measures Outcome: Progressing

## 2020-02-01 NOTE — Progress Notes (Signed)
Physical Therapy Treatment Patient Details Name: Douglas Carr MRN: [id number] DOB: September 02, 1965 Today's Date: 02/01/2020    History of Present Illness Pt is 54 y.o. male with medical history significant of ischemic cardiomyopathy, systolic heart failure, CVA with L-sided hemiparesis, type 2 diabetes mellitus, dyslipidemia, hypertension and anxiety. Presents to ED with dyspnea and chest pain over 4 day period. Admitted 01/22/20 for treatment of Acute decompensated systolic heart failure, complicated by acute cardiogenic pulmonary edema (acute hypoxic respiratory failure and NSTEMI. Pt s/p 9/21 heart catheterization, and 9/24 CABGx5.  Pt had external pacemaker wires removed 02/01/20.    PT Comments    Pt making gradual progress.  He was able to ambulate 60'x3 with min A , cues, and chair follow.  Pt demonstrated mild instability as he fatigued, BP was soft but pt denied syncopal symptoms.  Good adherence to sternal precautions with cues.  PT had recommended CIR at d/c but pt reports wants to go home.  He has support from wife and his friend who is in town.  Pt does have 4 steps to enter his home and needs stair training prior to discharge.    Follow Up Recommendations  Supervision/Assistance - 24 hour;Home health PT (pt declined CIR - wants to go home)     Equipment Recommendations  None recommended by PT    Recommendations for Other Services       Precautions / Restrictions Precautions Precautions: Sternal;Fall Precaution Booklet Issued: Yes (comment) (educated on sternal precautions)    Mobility  Bed Mobility Overal bed mobility: Needs Assistance Bed Mobility: Sit to Sidelying;Rolling Rolling: Supervision       Sit to sidelying: Min guard General bed mobility comments: min guard for balance as he lifed trunk  Transfers Overall transfer level: Needs assistance Equipment used: None Transfers: Sit to/from Stand Sit to Stand: Min guard         General transfer comment:  Performed x 3, cues for hand placement on knees, min guard for safety  Ambulation/Gait Ambulation/Gait assistance: Min assist;+2 safety/equipment Gait Distance (Feet): 60 Feet (60'x3) Assistive device: Rolling walker (2 wheeled) Gait Pattern/deviations: Step-through pattern;Decreased stride length Gait velocity: slowed   General Gait Details: Chair follow for safety and rest breaks; cues for posture and increased step length; ambulated slowly; as fatigued demonstrated decreased stability requiring min A for balance and had pt take rest breaks   Stairs             Wheelchair Mobility    Modified Rankin (Stroke Patients Only)       Balance Overall balance assessment: Needs assistance Sitting-balance support: Feet supported;No upper extremity supported Sitting balance-Leahy Scale: Good     Standing balance support: Bilateral upper extremity supported;During functional activity Standing balance-Leahy Scale: Poor Standing balance comment: required RW demonstrated mild instability with gait                            Cognition Arousal/Alertness: Awake/alert Behavior During Therapy: WFL for tasks assessed/performed Overall Cognitive Status: Within Functional Limits for tasks assessed                                 General Comments: Slow to respond at times - residual affects of CVA      Exercises      General Comments General comments (skin integrity, edema, etc.):  Pt on RA with sats 92% or >.  HR 80's-90's.  BP with moblity was 90's/60's - pt with c/o fatigue but no syncopal symptoms.    Pt's long time friend was present and reports he is going to assist as needed.  Pt declining CIR at discharge and wants to go home w HHPT.      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 Goal Formulation: With patient Time For Goal Achievement: 02/12/20 Potential to Achieve Goals: Good Progress towards PT goals: Progressing toward goals    Frequency    Min 3X/week      PT Plan Current plan remains appropriate    Co-evaluation              AM-PAC PT "6 Clicks" Mobility   Outcome Measure  Help needed turning from your back to your side while in a flat bed without using bedrails?: A Little Help needed moving from lying on your back to sitting on the side of a flat bed without using bedrails?: A Little Help needed moving to and from a bed to a chair (including a wheelchair)?: A Little Help needed standing up from a chair using your arms (e.g., wheelchair or bedside chair)?: A Little Help needed to walk in hospital room?: A Little Help needed climbing 3-5 steps with a railing? : A Lot 6 Click Score: 17    End of Session Equipment Utilized During Treatment: Gait belt Activity Tolerance: Patient tolerated treatment well Patient left: with call bell/phone within reach;with family/visitor present;in chair Nurse Communication: Mobility status PT Visit Diagnosis: Other abnormalities of gait and mobility (R26.89);Difficulty in walking, not elsewhere classified (R26.2)     Time: 2202-5427 PT Time Calculation (min) (ACUTE ONLY): 32 min  Charges:  $Gait Training: 8-22 mins $Therapeutic Activity: 8-22 mins                     Anise Salvo, PT Acute Rehab Services Pager [phone] [facility] [phone]     Rayetta Humphrey 02/01/2020, 1:13 PM

## 2020-02-01 NOTE — Progress Notes (Addendum)
      [address]       [place] [address]             [phone]        5 Days Post-Op Procedure(s) (LRB): CORONARY ARTERY BYPASS GRAFTING (CABG), ON PUMP, TIMES FIVE, USING BILATERAL MAMMARY ARTERIES, LEFT RADIAL ARTERY, AND ENDOSCOPICALLY HARVESTED RIGHT GREATER SAPHENOUS VEIN. (N/A) RADIAL ARTERY HARVEST (Left) TRANSESOPHAGEAL ECHOCARDIOGRAM (TEE) (N/A) INDOCYANINE GREEN FLUORESCENCE IMAGING (ICG) (N/A) ENDOVEIN HARVEST OF GREATER SAPHENOUS VEIN (Right) CLIPPING OF ATRIAL APPENDAGE USING ATRICURE 35 MM ATRICLIP (Left)  Subjective: Patient about to eat breakfast this am. He has not had a bowel movement yet.  Objective: Vital signs in last 24 hours: Temp:  [97.3 F (36.3 C)-98.3 F (36.8 C)] 98.3 F (36.8 C) (09/29 0300) Pulse Rate:  [89-96] 90 (09/29 0300) Cardiac Rhythm: Normal sinus rhythm (09/29 0300) Resp:  [9-24] 12 (09/29 0300) BP: (97-143)/(50-82) 106/65 (09/29 0300) SpO2:  [90 %-97 %] 93 % (09/29 0300) Weight:  [97.1 kg] 97.1 kg (09/29 0300)   Current Weight  02/01/20 97.1 kg       Intake/Output from previous day: 09/28 0701 - 09/29 0700 In: 676.3 [P.O.:480; I.V.:196.3] Out: 1900 [Urine:1900]   Physical Exam:  Cardiovascular: RRR, no murmurs, gallops, or rubs. Pulmonary: Clear to auscultation bilaterally; no rales, wheezes, or rhonchi. Abdomen: Soft, non tender, bowel sounds present. Extremities: Mild bilateral lower extremity edema. Wounds: Lower sternal wound has minor sero sanguionous drainage. No sign of infection. LUE wound is clean and dry.  No erythema or signs of infection.  Lab Results: CBC: Recent Labs    01/31/20 0356 02/01/20 0300  WBC 14.7* 13.6*  HGB 7.2* 7.7*  HCT 23.2* 23.7*  PLT 215 293   BMET:  Recent Labs    01/31/20 0356 02/01/20 0300  NA 128* 128*  K 4.0 4.1  CL 95* 95*  CO2 25 26  GLUCOSE 113* 78  BUN 44* 47*  CREATININE 2.13* 2.00*  CALCIUM 7.4* 7.5*    PT/INR:  Lab Results    Component Value Date   INR 1.5 (H) 01/27/2020   INR 1.1 01/26/2020   INR 1.01 05/20/2017   ABG:  INR: Will add last result for INR, ABG once components are confirmed Will add last 4 CBG results once components are confirmed  Assessment/Plan:  1. CV-S/p NSTEMI. SR with HR in the 90's. On Plavix 75 mg daily, Isordil 10 mg bid, Coreg 3.125 mg bid, Milrinone drip at 0.25.   As discussed with Dr. Vickey Sages, will decrease Milrinone drip to 0.125. No ACE yet as creatinine still elevated. 2. Pulmonary-on room air this am.  CXR this am appears stable (cardiomegaly, no pneumothorax, small left pleural effusion/atelectasis.) Encourage incentive spirometer. 3. Volume overload-on Lasix 40 mg IV daily. Transition to oral soon 4. Expected post op blood loss anemia-H and H this am decreased to 7.7 and 23.2.  5. DM-CBGs 170/58/80 . Pre op HGA1C 13.8.Will decrease scheduled Insulin to avoid further hypoglycemia. Will need close medical follow up after discharge 6. Post op AKI-Creatinine this am slightly decreased to 2. Creatinine upon admission 1.03. Continue to monitor 7. Hyponatremia-sodium remains 128. Likely related to diuresis 8. Remove EPW 9. LOC constipation  Donielle M ZimmermanPA-C 02/01/2020,7:13 AM Pt seen and examined; chart reviewed. Agree with documentation; will wean milrinone and aim for CIR admit later this week. Iyla Balzarini Z. Vickey Sages, MD [phone]

## 2020-02-01 NOTE — Progress Notes (Signed)
Occupational Therapy Treatment Patient Details Name: Douglas Carr MRN: [id number] DOB: 1965/05/29 Today's Date: 02/01/2020    History of present illness Pt is 54 y.o. male with medical history significant of ischemic cardiomyopathy, systolic heart failure, CVA with L-sided hemiparesis, type 2 diabetes mellitus, dyslipidemia, hypertension and anxiety. Presents to ED with dyspnea and chest pain over 4 day period. Admitted 01/22/20 for treatment of Acute decompensated systolic heart failure, complicated by acute cardiogenic pulmonary edema (acute hypoxic respiratory failure and NSTEMI. Pt s/p 9/21 heart catheterization, and 9/24 CABGx5.  Pt had external pacemaker wires removed 02/01/20.   OT comments  Pt educated in energy conservation strategies and given written handout. Encouraged use of incentive spirometer. Pt with mild unsteadiness with ambulation to sink and bathroom. Prefers hands on knees instead of holding heart pillow for sit<>stand. Pt is declining CIR, updated d/c plan. VSS on RA.  Follow Up Recommendations  Home health OT    Equipment Recommendations  None recommended by OT    Recommendations for Other Services      Precautions / Restrictions Precautions Precautions: Sternal;Fall Precaution Booklet Issued: Yes (comment) (educated on sternal precautions)       Mobility Bed Mobility  General bed mobility comments: pt in chair  Transfers Overall transfer level: Needs assistance Equipment used: Rolling walker (2 wheeled) Transfers: Sit to/from Stand Sit to Stand: Min guard         General transfer comment: prefers hands on knees, encouraged momentum    Balance Overall balance assessment: Needs assistance Sitting-balance support: Feet supported;No upper extremity supported Sitting balance-Leahy Scale: Good     Standing balance support: Bilateral upper extremity supported;During functional activity Standing balance-Leahy Scale: Poor Standing balance comment:  required RW demonstrated mild instability with gait                           ADL either performed or assessed with clinical judgement   ADL Overall ADL's : Needs assistance/impaired     Grooming: Min guard;Standing;Oral care;Wash/dry hands           Upper Body Dressing : Minimal assistance;Sitting       Toilet Transfer: Minimal assistance;Ambulation;RW;BSC   Toileting- Clothing Manipulation and Hygiene: Minimal assistance;Sit to/from stand       Functional mobility during ADLs: Minimal assistance;Rolling walker General ADL Comments: Educated in energy conservation and provided written handout.     Vision       Perception     Praxis      Cognition Arousal/Alertness: Awake/alert Behavior During Therapy: WFL for tasks assessed/performed Overall Cognitive Status: Within Functional Limits for tasks assessed                                 General Comments: Slow to respond at times - residual affects of CVA        Exercises     Shoulder Instructions        Pertinent Vitals/ Pain       Pain Assessment: Faces Faces Pain Scale: Hurts even more Pain Location: chest with coughing and mobility Pain Descriptors / Indicators: Sore;Aching Pain Intervention(s): Monitored during session;Repositioned;Patient requesting pain meds-RN notified  Home Living  Prior Functioning/Environment              Frequency  Min 2X/week        Progress Toward Goals  OT Goals(current goals can now be found in the care plan section)  Progress towards OT goals: Progressing toward goals  Acute Rehab 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 Goal Formulation: With patient Time For Goal Achievement: 02/13/20 Potential to Achieve Goals: Good  Plan Discharge plan needs to be updated    Co-evaluation                 AM-PAC OT "6 Clicks" Daily Activity     Outcome  Measure   Help from another person eating meals?: None Help from another person taking care of personal grooming?: A Little Help from another person toileting, which includes using toliet, bedpan, or urinal?: A Lot Help from another person bathing (including washing, rinsing, drying)?: A Lot Help from another person to put on and taking off regular upper body clothing?: A Little Help from another person to put on and taking off regular lower body clothing?: A Lot 6 Click Score: 16    End of Session Equipment Utilized During Treatment: Gait belt;Rolling walker  OT Visit Diagnosis: Unsteadiness on feet (R26.81);Other abnormalities of gait and mobility (R26.89);Pain   Activity Tolerance Patient tolerated treatment well   Patient Left in chair;with call bell/phone within reach   Nurse Communication Patient requests pain meds        Time: 1430-1451 OT Time Calculation (min): 21 min  Charges: OT General Charges $OT Visit: 1 Visit OT Treatments $Self Care/Home Management : 8-22 mins  Martie Round, OTR/L Acute Rehabilitation Services Pager: [phone] Office: [phone]   Evern Bio 02/01/2020, 2:52 PM

## 2020-02-01 NOTE — Progress Notes (Signed)
CARDIAC REHAB PHASE I   PRE:  Rate/Rhythm: 90 SR    BP: sitting 118/70    SaO2: 97 RA  MODE:  Ambulation: 130 ft (65 ft x2)   POST:  Rate/Rhythm: 90 SR    BP: sitting 109/68     SaO2: 92 RA  Pt motivated. Able to stand following sternal precautions. Used rollator, gait belt, assist x1 with another assist with IV pole. Deliberate steps, some ataxia, esp with fatigue. Pt voiced when/where he needed to rest. Turned and sat on rollator seat with cues. Rested 4 min then able to walk back to his recliner in room. Pt quiet and fatigued. VSS. Pts friend present and supportive. [phone]   Harriet Masson CES, ACSM 02/01/2020 2:30 PM

## 2020-02-02 LAB — CBC
HCT: 23.5 % — ABNORMAL LOW (ref 39.0–52.0)
Hemoglobin: 7.4 g/dL — ABNORMAL LOW (ref 13.0–17.0)
MCH: 27.8 pg (ref 26.0–34.0)
MCHC: 31.5 g/dL (ref 30.0–36.0)
MCV: 88.3 fL (ref 80.0–100.0)
Platelets: 347 10*3/uL (ref 150–400)
RBC: 2.66 MIL/uL — ABNORMAL LOW (ref 4.22–5.81)
RDW: 13.2 % (ref 11.5–15.5)
WBC: 11.3 10*3/uL — ABNORMAL HIGH (ref 4.0–10.5)
nRBC: 0 % (ref 0.0–0.2)

## 2020-02-02 LAB — GLUCOSE, CAPILLARY
Glucose-Capillary: 88 mg/dL (ref 70–99)
Glucose-Capillary: 106 mg/dL — ABNORMAL HIGH (ref 70–99)
Glucose-Capillary: 138 mg/dL — ABNORMAL HIGH (ref 70–99)
Glucose-Capillary: 177 mg/dL — ABNORMAL HIGH (ref 70–99)

## 2020-02-02 LAB — BASIC METABOLIC PANEL
Anion gap: 9 (ref 5–15)
BUN: 43 mg/dL — ABNORMAL HIGH (ref 6–20)
CO2: 27 mmol/L (ref 22–32)
Calcium: 7.8 mg/dL — ABNORMAL LOW (ref 8.9–10.3)
Chloride: 92 mmol/L — ABNORMAL LOW (ref 98–111)
Creatinine, Ser: 2.17 mg/dL — ABNORMAL HIGH (ref 0.61–1.24)
GFR calc Af Amer: 39 mL/min — ABNORMAL LOW (ref >60)
GFR calc non Af Amer: 34 mL/min — ABNORMAL LOW (ref >60)
Glucose, Bld: 137 mg/dL — ABNORMAL HIGH (ref 70–99)
Potassium: 5 mmol/L (ref 3.5–5.1)
Sodium: 128 mmol/L — ABNORMAL LOW (ref 135–145)

## 2020-02-02 LAB — COOXEMETRY PANEL
Carboxyhemoglobin: 1.6 % — ABNORMAL HIGH (ref 0.5–1.5)
Methemoglobin: 0.7 % (ref 0.0–1.5)
O2 Saturation: 46.6 %
Total hemoglobin: 9.6 g/dL — ABNORMAL LOW (ref 12.0–16.0)

## 2020-02-02 LAB — PREPARE RBC (CROSSMATCH)

## 2020-02-02 MED ORDER — FUROSEMIDE 10 MG/ML IJ SOLN
60.0000 mg | Freq: Two times a day (BID) | INTRAMUSCULAR | Status: DC
  Administered 2020-02-02 – 2020-02-04 (×4): 60 mg via INTRAVENOUS
  Filled 2020-02-02 (×4): qty 6

## 2020-02-02 MED ORDER — SODIUM CHLORIDE 0.9% IV SOLUTION: Freq: Once | INTRAVENOUS | Status: DC

## 2020-02-02 MED ORDER — POLYETHYLENE GLYCOL 3350 17 G PO PACK
17.0000 g | PACK | Freq: Every day | ORAL | Status: DC
  Administered 2020-02-02 – 2020-02-04 (×3): 17 g via ORAL
  Filled 2020-02-02 (×3): qty 1

## 2020-02-02 NOTE — Progress Notes (Signed)
Physical Therapy Treatment Patient Details Name: Douglas Carr MRN: [id number] DOB: 1966/04/22 Today's Date: 02/02/2020    History of Present Illness Pt is 54 y.o. male with medical history significant of ischemic cardiomyopathy, systolic heart failure, CVA with L-sided hemiparesis, type 2 diabetes mellitus, dyslipidemia, hypertension and anxiety. Presents to ED with dyspnea and chest pain over 4 day period. Admitted 01/22/20 for treatment of Acute decompensated systolic heart failure, complicated by acute cardiogenic pulmonary edema (acute hypoxic respiratory failure and NSTEMI. Pt s/p 9/21 heart catheterization, and 9/24 CABGx5.  Pt had external pacemaker wires removed 02/01/20.    PT Comments    Pt remains very appropriate and even though unable to recall precautions to verbalize he is maintaining precautions during transfers. Pt with improved gait tolerance with education for stairs and HEP this session. Pt reports comfort with wife assist and plan for home with HHPT.   Pre gait 125/71 Post gait 111/68 (81) HR 90-92 SpO2 89-94% on RA    Follow Up Recommendations  Supervision/Assistance - 24 hour;Home health PT     Equipment Recommendations  None recommended by PT    Recommendations for Other Services       Precautions / Restrictions Precautions Precautions: Sternal;Fall Precaution Comments: pt able to state 2 precautions with education for all provided    Mobility  Bed Mobility Overal bed mobility: Needs Assistance Bed Mobility: Supine to Sit     Supine to sit: Min assist     General bed mobility comments: min assist to fully roll toward left and elevate trunk from surface with bed flat and no rails  Transfers Overall transfer level: Needs assistance   Transfers: Sit to/from Stand;Stand Pivot Transfers Sit to Stand: Min guard;Min assist Stand pivot transfers: Min guard       General transfer comment: minguard with hands on knees, initial min assist to rise  from bed as pt with posterior bias and assist for balance. 2 repeated trials from recliner without physical assist and good control of descent.  Ambulation/Gait Ambulation/Gait assistance: Min guard;+2 safety/equipment Gait Distance (Feet): 130 Feet Assistive device: Rolling walker (2 wheeled) Gait Pattern/deviations: Step-through pattern;Decreased stride length   Gait velocity interpretation: 1.31 - 2.62 ft/sec, indicative of limited community ambulator General Gait Details: pt able to walk 130' after stairs with seated rest. Chair follow but not needed for further sessions with pt able to state fatigue and improved posture and stride during gait this session   Stairs Stairs: Yes Stairs assistance: Min guard Stair Management: Step to pattern;Sideways;One rail Right Number of Stairs: 4 General stair comments: cues for safety and insuring enough room for feet on step with pt performing sideways with rail without LOB while maintaining precautions   Wheelchair Mobility    Modified Rankin (Stroke Patients Only)       Balance Overall balance assessment: Needs assistance   Sitting balance-Leahy Scale: Good     Standing balance support: Bilateral upper extremity supported;During functional activity Standing balance-Leahy Scale: Poor                              Cognition Arousal/Alertness: Awake/alert Behavior During Therapy: WFL for tasks assessed/performed Overall Cognitive Status: Impaired/Different from baseline Area of Impairment: Memory                     Memory: Decreased recall of precautions  Exercises General Exercises - Lower Extremity Long Arc Quad: AROM;Both;15 reps;Seated Hip Flexion/Marching: AROM;Both;15 reps;Seated    General Comments        Pertinent Vitals/Pain Pain Assessment: No/denies pain    Home Living                      Prior Function            PT Goals (current goals can now be  found in the care plan section) Progress towards PT goals: Progressing toward goals    Frequency           PT Plan Current plan remains appropriate    Co-evaluation              AM-PAC PT "6 Clicks" Mobility   Outcome Measure  Help needed turning from your back to your side while in a flat bed without using bedrails?: A Little Help needed moving from lying on your back to sitting on the side of a flat bed without using bedrails?: A Little Help needed moving to and from a bed to a chair (including a wheelchair)?: A Little Help needed standing up from a chair using your arms (e.g., wheelchair or bedside chair)?: A Little Help needed to walk in hospital room?: A Little Help needed climbing 3-5 steps with a railing? : A Little 6 Click Score: 18    End of Session Equipment Utilized During Treatment: Gait belt Activity Tolerance: Patient tolerated treatment well Patient left: with call bell/phone within reach;in chair Nurse Communication: Mobility status PT Visit Diagnosis: Other abnormalities of gait and mobility (R26.89);Difficulty in walking, not elsewhere classified (R26.2)     Time: 1610-9604 PT Time Calculation (min) (ACUTE ONLY): 24 min  Charges:  $Gait Training: 8-22 mins $Therapeutic Exercise: 8-22 mins                     Douglas Carr P, PT Acute Rehabilitation Services Pager: [phone] Office: [phone]    Douglas Carr 02/02/2020, 12:54 PM

## 2020-02-02 NOTE — Progress Notes (Addendum)
[address]       [place] [address]             [phone]        6 Days Post-Op Procedure(s) (LRB): CORONARY ARTERY BYPASS GRAFTING (CABG), ON PUMP, TIMES FIVE, USING BILATERAL MAMMARY ARTERIES, LEFT RADIAL ARTERY, AND ENDOSCOPICALLY HARVESTED RIGHT GREATER SAPHENOUS VEIN. (N/A) RADIAL ARTERY HARVEST (Left) TRANSESOPHAGEAL ECHOCARDIOGRAM (TEE) (N/A) INDOCYANINE GREEN FLUORESCENCE IMAGING (ICG) (N/A) ENDOVEIN HARVEST OF GREATER SAPHENOUS VEIN (Right) CLIPPING OF ATRIAL APPENDAGE USING ATRICURE 35 MM ATRICLIP (Left)  Subjective:  He has not had a bowel movement yet, despite lactulose yesterday.  Objective: Vital signs in last 24 hours: Temp:  [97.5 F (36.4 C)-98 F (36.7 C)] 97.8 F (36.6 C) (09/30 0400) Pulse Rate:  [81-90] 85 (09/30 0400) Cardiac Rhythm: Normal sinus rhythm (09/30 0400) Resp:  [12-19] 12 (09/30 0400) BP: (111-119)/(55-81) 119/55 (09/30 0400) SpO2:  [92 %-96 %] 92 % (09/30 0400) Weight:  [98.7 kg] 98.7 kg (09/30 0400)   Current Weight  02/02/20 98.7 kg       Intake/Output from previous day: 09/29 0701 - 09/30 0700 In: 240 [P.O.:240] Out: 500 [Urine:500]   Physical Exam:  Cardiovascular: RRR, no murmurs, gallops, or rubs. Pulmonary: Clear to auscultation bilaterally; no rales, wheezes, or rhonchi. Abdomen: Soft, non tender, bowel sounds present. Extremities: Mild bilateral lower extremity edema. Wounds: Lower sternal wound has minor sero sanguionous drainage. No sign of infection. LUE wound is clean and dry.  No erythema or signs of infection.  Lab Results: CBC: Recent Labs    02/01/20 0300 02/02/20 0530  WBC 13.6* 11.3*  HGB 7.7* 7.4*  HCT 23.7* 23.5*  PLT 293 347   BMET:  Recent Labs    01/31/20 0356 02/01/20 0300  NA 128* 128*  K 4.0 4.1  CL 95* 95*  CO2 25 26  GLUCOSE 113* 78  BUN 44* 47*  CREATININE 2.13* 2.00*  CALCIUM 7.4* 7.5*    PT/INR:  Lab Results  Component Value Date   INR 1.5  (H) 01/27/2020   INR 1.1 01/26/2020   INR 1.01 05/20/2017   ABG:  INR: Will add last result for INR, ABG once components are confirmed Will add last 4 CBG results once components are confirmed  Assessment/Plan:  1. CV-S/p NSTEMI. SR with HR in the 80's. On Plavix 75 mg daily, Isordil 10 mg bid, Coreg 3.125 mg bid, Milrinone drip at 0.125.  Co ox this am 46.6. As discussed with Dr. Vickey Sages, stop Milrinone drip.  No ACE yet as creatinine still elevated. 2. Pulmonary-on room air this am.  CXR this am appears stable (cardiomegaly, no pneumothorax, small left pleural effusion/atelectasis.) Encourage incentive spirometer. 3. Volume overload-on Lasix 40 mg IV daily. Transition to oral soon 4. Expected post op blood loss anemia-H and H this am decreased to 7.4 and 23.5.  5. DM-CBGs 107/124/88 . Pre op HGA1C 13.8.Will decrease scheduled Insulin to avoid further hypoglycemia. Will need close medical follow up after discharge 6. Post op AKI-Last creatinine this am slightly decreased to 2. Creatinine upon admission 1.03. Await this am's result. Continue to monitor 7. Hyponatremia-last sodium remains 128. Likely related to diuresis 8. Miralax for constipation 9. Regarding disposition, patient declined CIR. Home PT and OT ordered. 10. Possible discharge in am as long as creatinine trending downward  Donielle M ZimmermanPA-C 02/02/2020,7:03 AM  Pt seen and examined; agree with documentation. Plan txfuse 2 units PRBC; increase Lasix dosing.  Ephriam Knuckles  Z. Vickey Sages, MD [phone]

## 2020-02-02 NOTE — Progress Notes (Signed)
Came to ambulate however pt c/o feeling cold "I can't get warm" for about an hour. Has 3-4 blankets on. Temp 98.0, BP 140-95, retake 138/76. HR in 90s SR. Has been getting blood. Notified RN. [phone] Ethelda Chick CES, ACSM 1:34 PM 02/02/2020

## 2020-02-02 NOTE — Discharge Instructions (Signed)

## 2020-02-03 LAB — CBC
HCT: 26.8 % — ABNORMAL LOW (ref 39.0–52.0)
Hemoglobin: 8.6 g/dL — ABNORMAL LOW (ref 13.0–17.0)
MCH: 28.1 pg (ref 26.0–34.0)
MCHC: 32.1 g/dL (ref 30.0–36.0)
MCV: 87.6 fL (ref 80.0–100.0)
Platelets: 419 10*3/uL — ABNORMAL HIGH (ref 150–400)
RBC: 3.06 MIL/uL — ABNORMAL LOW (ref 4.22–5.81)
RDW: 13.9 % (ref 11.5–15.5)
WBC: 10.9 10*3/uL — ABNORMAL HIGH (ref 4.0–10.5)
nRBC: 0 % (ref 0.0–0.2)

## 2020-02-03 LAB — GLUCOSE, CAPILLARY
Glucose-Capillary: 132 mg/dL — ABNORMAL HIGH (ref 70–99)
Glucose-Capillary: 192 mg/dL — ABNORMAL HIGH (ref 70–99)
Glucose-Capillary: 169 mg/dL — ABNORMAL HIGH (ref 70–99)
Glucose-Capillary: 270 mg/dL — ABNORMAL HIGH (ref 70–99)

## 2020-02-03 LAB — BASIC METABOLIC PANEL
Anion gap: 7 (ref 5–15)
BUN: 47 mg/dL — ABNORMAL HIGH (ref 6–20)
CO2: 28 mmol/L (ref 22–32)
Calcium: 7.9 mg/dL — ABNORMAL LOW (ref 8.9–10.3)
Chloride: 93 mmol/L — ABNORMAL LOW (ref 98–111)
Creatinine, Ser: 2.12 mg/dL — ABNORMAL HIGH (ref 0.61–1.24)
GFR calc Af Amer: 40 mL/min — ABNORMAL LOW (ref >60)
GFR calc non Af Amer: 34 mL/min — ABNORMAL LOW (ref >60)
Glucose, Bld: 138 mg/dL — ABNORMAL HIGH (ref 70–99)
Potassium: 4.7 mmol/L (ref 3.5–5.1)
Sodium: 128 mmol/L — ABNORMAL LOW (ref 135–145)

## 2020-02-03 MED ORDER — OXYCODONE HCL 5 MG PO TABS
5.0000 mg | ORAL_TABLET | ORAL | 0 refills | Status: DC | PRN
Start: 2020-02-03 — End: 2020-02-14

## 2020-02-03 MED ORDER — CARVEDILOL 3.125 MG PO TABS
3.1250 mg | ORAL_TABLET | Freq: Two times a day (BID) | ORAL | 1 refills | Status: DC
Start: 2020-02-03 — End: 2020-02-17

## 2020-02-03 MED ORDER — FUROSEMIDE 80 MG PO TABS: 80.0000 mg | ORAL_TABLET | Freq: Two times a day (BID) | ORAL | 1 refills | Status: DC

## 2020-02-03 MED ORDER — ISOSORBIDE DINITRATE 10 MG PO TABS: 10.0000 mg | ORAL_TABLET | Freq: Two times a day (BID) | ORAL | 0 refills | Status: DC

## 2020-02-03 MED ORDER — FLEET ENEMA 7-19 GM/118ML RE ENEM
1.0000 | ENEMA | Freq: Once | RECTAL | Status: AC
  Administered 2020-02-03: 1 via RECTAL
  Filled 2020-02-03: qty 1

## 2020-02-03 MED ORDER — COLCHICINE 0.6 MG PO TABS
0.3000 mg | ORAL_TABLET | Freq: Two times a day (BID) | ORAL | 0 refills | Status: DC
Start: 2020-02-03 — End: 2020-02-14

## 2020-02-03 MED ORDER — POTASSIUM CHLORIDE CRYS ER 10 MEQ PO TBCR: 10.0000 meq | EXTENDED_RELEASE_TABLET | Freq: Every day | ORAL | 1 refills | Status: DC

## 2020-02-03 NOTE — Progress Notes (Signed)
CARDIAC REHAB PHASE I   PRE:  Rate/Rhythm: 82 SR  BP:  Supine:   Sitting: 129/70     SaO2: 99 2L, 95 RA  MODE:  Ambulation: 190 ft   POST:  Rate/Rhythem: 88 SR  BP:  Supine:   Sitting: 118/93    SaO2: 98 RA  Pt was seated in recliner upon arrival. Pt was independent getting out chair. Once pt stood with walker and walked a few feet he had incontinence with bowel discharge. Pt sat on toilet in room for 10 minutes. Pt ambulated 190 ft using walker x1 assist, gait belt, and x1 standby assist. Pt had no complaints or concerns while walking. Pt was able to walk 190 ft without having to take a break. Returned pt to recliner. Discussed IS use, pt was receptive. Will discuss education, waiting for wife to be present.  [phone]   Norris Cross, MS, CEP 02/03/2020 2:59 PM

## 2020-02-03 NOTE — Plan of Care (Signed)
  Problem: Activity: Goal: Ability to return to baseline activity level will improve Outcome: Progressing   Problem: Cardiovascular: Goal: Ability to achieve and maintain adequate cardiovascular perfusion will improve Outcome: Progressing   Problem: Health Behavior/Discharge Planning: Goal: Ability to safely manage health-related needs after discharge will improve Outcome: Progressing   Problem: Education: Goal: Knowledge of General Education information will improve Description: Including pain rating scale, medication(s)/side effects and non-pharmacologic comfort measures Outcome: Progressing   Problem: Health Behavior/Discharge Planning: Goal: Ability to manage health-related needs will improve Outcome: Progressing

## 2020-02-03 NOTE — Progress Notes (Addendum)
      [address]       [place] [address]             [phone]        7 Days Post-Op Procedure(s) (LRB): CORONARY ARTERY BYPASS GRAFTING (CABG), ON PUMP, TIMES FIVE, USING BILATERAL MAMMARY ARTERIES, LEFT RADIAL ARTERY, AND ENDOSCOPICALLY HARVESTED RIGHT GREATER SAPHENOUS VEIN. (N/A) RADIAL ARTERY HARVEST (Left) TRANSESOPHAGEAL ECHOCARDIOGRAM (TEE) (N/A) INDOCYANINE GREEN FLUORESCENCE IMAGING (ICG) (N/A) ENDOVEIN HARVEST OF GREATER SAPHENOUS VEIN (Right) CLIPPING OF ATRIAL APPENDAGE USING ATRICURE 35 MM ATRICLIP (Left)  Subjective: Patient sitting in chair. He had a small bowel movement yesterday. He denies abdominal pain or nausea  Objective: Vital signs in last 24 hours: Temp:  [97.6 F (36.4 C)-98.5 F (36.9 C)] 97.7 F (36.5 C) (10/01 0300) Pulse Rate:  [80-97] 80 (10/01 0300) Cardiac Rhythm: Normal sinus rhythm (10/01 0300) Resp:  [12-20] 12 (10/01 0300) BP: (97-131)/(55-108) 108/73 (10/01 0300) SpO2:  [90 %-97 %] 97 % (10/01 0300) Weight:  [98.7 kg] 98.7 kg (10/01 0557)   Current Weight  02/03/20 98.7 kg       Intake/Output from previous day: 09/30 0701 - 10/01 0700 In: 315 [Blood:315] Out: 200 [Urine:200]   Physical Exam:  Cardiovascular: RRR Pulmonary: Diminished bibasilar breath sounds Abdomen: Soft, non tender, bowel sounds present. Extremities: Mild bilateral lower extremity edema. Wounds: Lower sternal wound has some dried sero sanguionous drainage on dressing but no active drainage this am. No sign of infection. LUE wound is clean and dry.  No erythema or signs of infection.  Lab Results: CBC: Recent Labs    02/02/20 0530 02/03/20 0537  WBC 11.3* 10.9*  HGB 7.4* 8.6*  HCT 23.5* 26.8*  PLT 347 419*   BMET:  Recent Labs    02/02/20 1000 02/03/20 0537  NA 128* 128*  K 5.0 4.7  CL 92* 93*  CO2 27 28  GLUCOSE 137* 138*  BUN 43* 47*  CREATININE 2.17* 2.12*  CALCIUM 7.8* 7.9*    PT/INR:  Lab Results    Component Value Date   INR 1.5 (H) 01/27/2020   INR 1.1 01/26/2020   INR 1.01 05/20/2017   ABG:  INR: Will add last result for INR, ABG once components are confirmed Will add last 4 CBG results once components are confirmed  Assessment/Plan:  1. CV-S/p NSTEMI. SR with HR in the 80's. On Plavix 75 mg daily, Isordil 10 mg bid, Coreg 3.125 mg bid,   No ACE yet as creatinine still elevated. 2. Pulmonary-on room air this am. Encourage incentive spirometer. 3. Volume overload-on Lasix 60 mg IV bid 4. Expected post op blood loss anemia-H and H this am increased to 8.6 and 26.8 (had transfusion yesterday) 5. DM-CBGs 138/177/132 . Pre op HGA1C 13.8. Will need close medical follow up after discharge 6. Post op AKI-Last creatinine this am 2.12. Creatinine upon admission 1.03. Will check BMET after discharge 7. Hyponatremia-last sodium remains 128. Likely related to diuresis 9. Regarding disposition, patient declined CIR. Home PT and OT ordered. Likely discharge in am   Kaaren Nass M ZimmermanPA-C 02/03/2020,7:01 AM

## 2020-02-03 NOTE — Progress Notes (Signed)
Physical Therapy Treatment Patient Details Name: Douglas Carr MRN: [id number] DOB: 1965/11/03 Today's Date: 02/03/2020    History of Present Illness Pt is 54 y.o. male with medical history significant of ischemic cardiomyopathy, systolic heart failure, CVA with L-sided hemiparesis, type 2 diabetes mellitus, dyslipidemia, hypertension and anxiety. Presents to ED with dyspnea and chest pain over 4 day period. Admitted 01/22/20 for treatment of Acute decompensated systolic heart failure, complicated by acute cardiogenic pulmonary edema (acute hypoxic respiratory failure and NSTEMI. Pt s/p 9/21 heart catheterization, and 9/24 CABGx5.  Pt had external pacemaker wires removed 02/01/20.    PT Comments    Pt continues to make good progress.  He needs min cues for sternal precautions with sitting and repositioning in chair.  Able to increase gait without chair follow today.  All VSS on RA.  Cont POC.    Follow Up Recommendations  Supervision/Assistance - 24 hour;Home health PT     Equipment Recommendations  None recommended by PT    Recommendations for Other Services       Precautions / Restrictions Precautions Precautions: Sternal;Fall Precaution Booklet Issued: Yes (comment) Precaution Comments: Pt able to recall not pushing/pulling or reaching back    Mobility  Bed Mobility Overal bed mobility: Needs Assistance       Supine to sit: Min guard     General bed mobility comments: good maintenance of precautions and use of cardiac pillow for transfers  Transfers Overall transfer level: Needs assistance Equipment used: Rolling walker (2 wheeled) Transfers: Sit to/from Stand Sit to Stand: Min guard         General transfer comment: Sit to stand x 3 with cues for hands on knees to rise and lower; min guard for safety  Ambulation/Gait Ambulation/Gait assistance: Min guard Gait Distance (Feet): 200 Feet Assistive device: Rolling walker (2 wheeled) Gait Pattern/deviations:  Step-through pattern;Decreased stride length     General Gait Details: Able to ambulate 200' without chair follow or rest breaks. Pt able to monitor fatigue and determine when to return around.  Min cues for increased stride length.   Stairs         General stair comments: reports feels confident with stairs   Wheelchair Mobility    Modified Rankin (Stroke Patients Only)       Balance Overall balance assessment: Needs assistance Sitting-balance support: Feet supported;No upper extremity supported Sitting balance-Leahy Scale: Good     Standing balance support: No upper extremity supported;Bilateral upper extremity supported Standing balance-Leahy Scale: Fair Standing balance comment: Used RW with gait but able to stand statically without UE support                            Cognition Arousal/Alertness: Awake/alert Behavior During Therapy: WFL for tasks assessed/performed Overall Cognitive Status: Impaired/Different from baseline Area of Impairment: Memory                     Memory: Decreased recall of precautions         General Comments: Slow to respond at times - residual affects of CVA      Exercises      General Comments General comments (skin integrity, edema, etc.): Pt on RA with sats 94% or >.  HR 80's-90's.  Some c/o lightheadedness with initial standing bp was 138/65.  Educated on precautions.      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 Goal Formulation: With patient Time For Goal  Achievement: 02/12/20 Potential to Achieve Goals: Good Progress towards PT goals: Progressing toward goals    Frequency    Min 3X/week      PT Plan Current plan remains appropriate    Co-evaluation              AM-PAC PT "6 Clicks" Mobility   Outcome Measure  Help needed turning from your back to your side while in a flat bed without using bedrails?: None Help needed moving from lying on your back to sitting on the side of a flat bed without using bedrails?: A Little Help needed moving to and from a bed to a chair (including a wheelchair)?: A Little Help needed standing up from a chair using your arms (e.g., wheelchair or bedside chair)?: A Little Help needed to walk in hospital room?: A Little Help needed climbing 3-5 steps with a railing? : A Little 6 Click Score: 19    End of Session Equipment Utilized During Treatment: Gait belt Activity Tolerance: Patient tolerated treatment well Patient left: with call bell/phone within reach;in chair Nurse Communication: Mobility status PT Visit Diagnosis: Other abnormalities of gait and mobility (R26.89);Difficulty in walking, not elsewhere classified (R26.2)     Time: 1594-5859 PT Time Calculation (min) (ACUTE ONLY): 20 min  Charges:  $Gait Training: 8-22 mins                     Anise Salvo, PT Acute Rehab Services Pager [phone] [facility] [phone]     Rayetta Humphrey 02/03/2020, 1:52 PM

## 2020-02-04 LAB — BASIC METABOLIC PANEL
Anion gap: 9 (ref 5–15)
BUN: 46 mg/dL — ABNORMAL HIGH (ref 6–20)
CO2: 29 mmol/L (ref 22–32)
Calcium: 7.9 mg/dL — ABNORMAL LOW (ref 8.9–10.3)
Chloride: 94 mmol/L — ABNORMAL LOW (ref 98–111)
Creatinine, Ser: 2.03 mg/dL — ABNORMAL HIGH (ref 0.61–1.24)
GFR calc Af Amer: 42 mL/min — ABNORMAL LOW (ref >60)
GFR calc non Af Amer: 36 mL/min — ABNORMAL LOW (ref >60)
Glucose, Bld: 113 mg/dL — ABNORMAL HIGH (ref 70–99)
Potassium: 4.8 mmol/L (ref 3.5–5.1)
Sodium: 132 mmol/L — ABNORMAL LOW (ref 135–145)

## 2020-02-04 LAB — GLUCOSE, CAPILLARY: Glucose-Capillary: 100 mg/dL — ABNORMAL HIGH (ref 70–99)

## 2020-02-04 NOTE — Progress Notes (Signed)
      [address]       [place] [address]             [phone]      8 Days Post-Op Procedure(s) (LRB): CORONARY ARTERY BYPASS GRAFTING (CABG), ON PUMP, TIMES FIVE, USING BILATERAL MAMMARY ARTERIES, LEFT RADIAL ARTERY, AND ENDOSCOPICALLY HARVESTED RIGHT GREATER SAPHENOUS VEIN. (N/A) RADIAL ARTERY HARVEST (Left) TRANSESOPHAGEAL ECHOCARDIOGRAM (TEE) (N/A) INDOCYANINE GREEN FLUORESCENCE IMAGING (ICG) (N/A) ENDOVEIN HARVEST OF GREATER SAPHENOUS VEIN (Right) CLIPPING OF ATRIAL APPENDAGE USING ATRICURE 35 MM ATRICLIP (Left)   Subjective:  Up in chair, on complaints.  Happy to go home today. + ambulation  + BM  Objective: Vital signs in last 24 hours: Temp:  [97.6 F (36.4 C)-97.9 F (36.6 C)] 97.7 F (36.5 C) (10/02 0749) Pulse Rate:  [81-88] 88 (10/02 0749) Cardiac Rhythm: Normal sinus rhythm (10/02 0700) Resp:  [13-18] 18 (10/02 0749) BP: (117-139)/(70-76) 139/70 (10/02 0749) SpO2:  [90 %-99 %] 97 % (10/02 0749) Weight:  [99 kg] 99 kg (10/02 0635)  Intake/Output from previous day: 10/01 0701 - 10/02 0700 In: 240 [P.O.:240] Out: 650 [Urine:650]  General appearance: alert, cooperative and no distress Heart: regular rate and rhythm Lungs: clear to auscultation bilaterally Abdomen: soft, non-tender; bowel sounds normal; no masses,  no organomegaly Extremities: edema trace Wound: clean and dry, staples in place  Lab Results: Recent Labs    02/02/20 0530 02/03/20 0537  WBC 11.3* 10.9*  HGB 7.4* 8.6*  HCT 23.5* 26.8*  PLT 347 419*   BMET:  Recent Labs    02/03/20 0537 02/04/20 0500  NA 128* 132*  K 4.7 4.8  CL 93* 94*  CO2 28 29  GLUCOSE 138* 113*  BUN 47* 46*  CREATININE 2.12* 2.03*  CALCIUM 7.9* 7.9*    PT/INR: No results for input(s): LABPROT, INR in the last 72 hours. ABG    Component Value Date/Time   PHART 7.333 (L) 01/27/2020 2211   HCO3 23.8 01/27/2020 2211   TCO2 25 01/27/2020 2211   ACIDBASEDEF 2.0 01/27/2020 2211    O2SAT 46.6 02/02/2020 0530   CBG (last 3)  Recent Labs    02/03/20 1604 02/03/20 2005 02/04/20 0619  GLUCAP 169* 270* 100*    Assessment/Plan: S/P Procedure(s) (LRB): CORONARY ARTERY BYPASS GRAFTING (CABG), ON PUMP, TIMES FIVE, USING BILATERAL MAMMARY ARTERIES, LEFT RADIAL ARTERY, AND ENDOSCOPICALLY HARVESTED RIGHT GREATER SAPHENOUS VEIN. (N/A) RADIAL ARTERY HARVEST (Left) TRANSESOPHAGEAL ECHOCARDIOGRAM (TEE) (N/A) INDOCYANINE GREEN FLUORESCENCE IMAGING (ICG) (N/A) ENDOVEIN HARVEST OF GREATER SAPHENOUS VEIN (Right) CLIPPING OF ATRIAL APPENDAGE USING ATRICURE 35 MM ATRICLIP (Left)  1. CV- NSR, BP stable- continue Plavix, Isordil, Coreg 2. Pulm- no acute issues, continue IS 3. Renal- creatinine improved this morning at 2.03, continue oral lasix, will have patient get repeat BMET in a few days, hyponatremia improved 4. DM- poorly controlled,  patient will need follow up, cbgs ok in hospital 5. Dispo- patient stable, will d/c home today   LOS: 13 days    Lowella Dandy, PA-C 02/04/2020

## 2020-02-04 NOTE — Progress Notes (Addendum)
CARDIAC REHAB PHASE I   PRE:  Rate/Rhythm: 86 NSR    BP: sitting 147/79    SaO2: 95 RA  MODE:  Ambulation: 198 ft   POST:  Rate/Rhythm: 97 NSR    BP: sitting 134/83     SaO2: 100 RA  [id number] Patient sitting in recliner upon arrival. VSS. Patient ambulated in hallway with RW x 1 assist. Slow steady gait noted. Patient denies cardiac complaints however did complain of being "tired". Patient ambulates with walker at home secondary to prior stroke residual. Patient back to recliner with phone and call bell in reach post ambulation. Potential discharge today. Spoke with wife via telephone. Will return to educate patient once wife at bedside.   Douglas Carr Hoover Brunette RN, BSN

## 2020-02-04 NOTE — Progress Notes (Signed)
Discharge instructions reviewed with patient and spouse Erskine Squibb.  Follow up appointments reviewed and patient and spouse both verbalize understanding.  A written copy was given to patient wife.  Waiting for set of clothing that daughter is bringing from home.  02/04/2020  1308  Patient via wheelchair to wife's waiting car in stable condition and assisted into passenger seat.

## 2020-02-04 NOTE — Plan of Care (Signed)
Problem: Education: Goal: Understanding of CV disease, CV risk reduction, and recovery process will improve Outcome: Completed/Met Goal: Individualized Educational Video(s) Outcome: Completed/Met   Problem: Activity: Goal: Ability to return to baseline activity level will improve Outcome: Completed/Met   Problem: Cardiovascular: Goal: Ability to achieve and maintain adequate cardiovascular perfusion will improve Outcome: Completed/Met   Problem: Health Behavior/Discharge Planning: Goal: Ability to safely manage health-related needs after discharge will improve Outcome: Completed/Met   Problem: Education: Goal: Knowledge of General Education information will improve Description: Including pain rating scale, medication(s)/side effects and non-pharmacologic comfort measures Outcome: Completed/Met   Problem: Health Behavior/Discharge Planning: Goal: Ability to manage health-related needs will improve Outcome: Completed/Met   Problem: Clinical Measurements: Goal: Ability to maintain clinical measurements within normal limits will improve Outcome: Completed/Met Goal: Will remain free from infection Outcome: Completed/Met Goal: Diagnostic test results will improve Outcome: Completed/Met Goal: Respiratory complications will improve Outcome: Completed/Met Goal: Cardiovascular complication will be avoided Outcome: Completed/Met   Problem: Activity: Goal: Risk for activity intolerance will decrease Outcome: Completed/Met   Problem: Nutrition: Goal: Adequate nutrition will be maintained Outcome: Completed/Met   Problem: Coping: Goal: Level of anxiety will decrease Outcome: Completed/Met   Problem: Elimination: Goal: Will not experience complications related to bowel motility Outcome: Completed/Met Goal: Will not experience complications related to urinary retention Outcome: Completed/Met   Problem: Pain Managment: Goal: General experience of comfort will  improve Outcome: Completed/Met   Problem: Safety: Goal: Ability to remain free from injury will improve Outcome: Completed/Met   Problem: Skin Integrity: Goal: Risk for impaired skin integrity will decrease Outcome: Completed/Met   Problem: Education: Goal: Ability to demonstrate management of disease process will improve Outcome: Completed/Met Goal: Ability to verbalize understanding of medication therapies will improve Outcome: Completed/Met Goal: Individualized Educational Video(s) Outcome: Completed/Met   Problem: Activity: Goal: Capacity to carry out activities will improve Outcome: Completed/Met   Problem: Cardiac: Goal: Ability to achieve and maintain adequate cardiopulmonary perfusion will improve Outcome: Completed/Met   Problem: Education: Goal: Will demonstrate proper wound care and an understanding of methods to prevent future damage Outcome: Completed/Met Goal: Knowledge of disease or condition will improve Outcome: Completed/Met Goal: Knowledge of the prescribed therapeutic regimen will improve Outcome: Completed/Met   Problem: Activity: Goal: Risk for activity intolerance will decrease Outcome: Completed/Met   Problem: Cardiac: Goal: Will achieve and/or maintain hemodynamic stability Outcome: Completed/Met   Problem: Clinical Measurements: Goal: Postoperative complications will be avoided or minimized Outcome: Completed/Met   Problem: Respiratory: Goal: Respiratory status will improve Outcome: Completed/Met   Problem: Skin Integrity: Goal: Wound healing without signs and symptoms of infection Outcome: Completed/Met Goal: Risk for impaired skin integrity will decrease Outcome: Completed/Met   Problem: Urinary Elimination: Goal: Ability to achieve and maintain adequate renal perfusion and functioning will improve Outcome: Completed/Met   Problem: Acute Rehab PT Goals(only PT should resolve) Goal: Pt Will Go Supine/Side To Sit Outcome:  Completed/Met Goal: Patient Will Transfer Sit To/From Stand Outcome: Completed/Met Goal: Pt Will Ambulate Outcome: Completed/Met Goal: Pt Will Go Up/Down Stairs Outcome: Completed/Met   Problem: Acute Rehab OT Goals (only OT should resolve) Goal: Pt. Will Perform Grooming Outcome: Completed/Met Goal: Pt. Will Perform Upper Body Bathing Outcome: Completed/Met Goal: Pt. Will Perform Lower Body Bathing Outcome: Completed/Met Goal: Pt. Will Perform Upper Body Dressing Outcome: Completed/Met Goal: Pt. Will Transfer To Toilet Outcome: Completed/Met Goal: Pt. Will Perform Toileting-Clothing Manipulation Outcome: Completed/Met Goal: OT Additional ADL Goal #1 Outcome: Completed/Met   Problem: Acute Rehab OT Goals (only OT should  resolve) Goal: OT Additional ADL Goal #2 Outcome: Completed/Met

## 2020-02-04 NOTE — Plan of Care (Signed)
  Problem: Education: Goal: Understanding of CV disease, CV risk reduction, and recovery process will improve Outcome: Progressing   Problem: Activity: Goal: Ability to return to baseline activity level will improve Outcome: Progressing   Problem: Cardiovascular: Goal: Ability to achieve and maintain adequate cardiovascular perfusion will improve Outcome: Progressing   Problem: Health Behavior/Discharge Planning: Goal: Ability to safely manage health-related needs after discharge will improve Outcome: Progressing   Problem: Education: Goal: Knowledge of General Education information will improve Description: Including pain rating scale, medication(s)/side effects and non-pharmacologic comfort measures Outcome: Progressing   Problem: Clinical Measurements: Goal: Ability to maintain clinical measurements within normal limits will improve Outcome: Progressing Goal: Will remain free from infection Outcome: Progressing Goal: Cardiovascular complication will be avoided Outcome: Progressing

## 2020-02-04 NOTE — Care Management (Signed)
Home health set up with [facility]. Discussed w patient's wife over the phone. No other CM needs identified.

## 2020-02-04 NOTE — Progress Notes (Signed)
CARDIAC REHAB PHASE I   [phone] Wife at bedside. CR RN returning to complete discharge education with wife and patient. Reviewed [facility] booklet including discharge instructions, activity progression, diet (heart healthy and diabetic), phase 2 cardiac rehab. Patient states he has done phase 2 CR in the past. Unsure if he will re-enroll however he is agreeable for phase 2 order to be placed for [place]. Patient and wife also provided handouts on exercise and activity progression and encouraged to watch "recovering from [facility]" video.    Karn Derk Hoover Brunette RN, BSN

## 2020-02-05 ENCOUNTER — Other Ambulatory Visit: Payer: Self-pay | Admitting: Neurology

## 2020-02-05 ENCOUNTER — Other Ambulatory Visit: Payer: Self-pay | Admitting: Family Medicine

## 2020-02-05 DIAGNOSIS — E782 Mixed hyperlipidemia: Secondary | ICD-10-CM

## 2020-02-05 LAB — TYPE AND SCREEN
ABO/RH(D): AB POS
Antibody Screen: NEGATIVE
Unit division: 0
Unit division: 0

## 2020-02-05 LAB — BPAM RBC
Blood Product Expiration Date: 202110252359
Blood Product Expiration Date: 202110252359
ISSUE DATE / TIME: 202109301129
Unit Type and Rh: 6200
Unit Type and Rh: 6200

## 2020-02-06 ENCOUNTER — Telehealth: Payer: Self-pay

## 2020-02-06 MED ORDER — HYDROXYZINE HCL 25 MG PO TABS: 25.0000 mg | ORAL_TABLET | Freq: Three times a day (TID) | ORAL | 0 refills | Status: DC | PRN

## 2020-02-06 NOTE — Telephone Encounter (Signed)
**Note De-Identified  Obfuscation** Patient and his wife Erskine Squibb contacted regarding the pts discharge from [facility] on 02/04/2020.  Patient and Erskine Squibb understands to follow up with provider [name], PA-c on 02/22/2020 at 8:45 at [address], [address] in [place], [place] [address]. Patient and Erskine Squibb understands discharge instructions? Yes Patient and Erskine Squibb understands medications and regiment? Yes Patient ad Erskine Squibb understands to bring all medications to this visit? Yes  Ask patient:  Are you enrolled in My Chart: Yes  Postop Surgical Patients:                What is your wound status? "Looks good" Any signs/ symptoms of infection (Temp, redness/ red streaks, swelling, purulent drainage, foul odor or smell)? No .             Please do not place any creams/ lotions/ or antibiotic ointment on any surgical incisions/ wounds without physician approval. The pt and Erskine Squibb have been advised and they verbalized understanding. .             Do you have any questions about your medications? No  All medications (except pain medications) are to be filled by your Cardiologist AFTER your first post op 10/20/21appointment with them.  Are you taking your pain medication? Yes .             How is your pain controlled? Well but just took a Oxycodone to control pain as pt just woke up  Pain level? Currently 5 on a scale of 1 to 10 with 1 being the least and 10 being the worst. Erskine Squibb states that "his pain pill has not kicked in yet". .             If you require a refill on pain medications, know that the same medication/ amount may not be prescribed or a refill may not be given.  Pt and Erskine Squibb advised. Please contact your pharmacy for refill requests. Pt and his wife advised .             Do you have help at home with ADL's? Yes  If you have home health, have you been contacted or seen by the agency? Yes, they are scheduled to be at the pts home today per Erskine Squibb. .             Please refer to your Pre/post surgery booklet, there is a lot of  useful information in it that may answer any questions you may have. Pt and his wife advised. .             Please note that it is ok to remove your surgical dressing, shower (soap/ water), and pat the incision dry. Erskine Squibb states they have already done this and his incision looks good and they did not apply any lotions, ointments or creams to his wound afterwards.  They are aware of appointment scheduled with [facility] on 10/7 for staple removal.

## 2020-02-06 NOTE — Telephone Encounter (Signed)
Wife aware. Ben with [facility] was at patient's house while talking to wife and PT states that he will be coming out 2 times weekly for 4 weeks and would like to send nurse out to monitor patient chest and left arm incision for signs and symptoms of infection

## 2020-02-06 NOTE — Telephone Encounter (Signed)
Typically in the hospital they will give Ozempic and Tresiba long-acting, they will do mostly short acting, since he is back home have him go ahead and restart those medications and let me know how the blood sugars are running and we can always discuss other options if it is running high.  I sent hydroxyzine that he can use for panic attacks as needed.

## 2020-02-06 NOTE — Telephone Encounter (Signed)
Wife called stating that patient was receiving Novolog up to 3 times daily if BS were elevated but was not sent home with any.  Unable to get patient in with Dr. Louanne Skye until 02/14/20 can a prescription of novolog with instructions be sent in to pharmacy.  Also wife states that patient was having "panic attacks" in hospital and given Valium as needed.  Wife would like to know if patient could have something prescribed to take as needed until his appt.

## 2020-02-07 NOTE — Telephone Encounter (Signed)
I think that is a good idea, if they need an order then please have it placed.

## 2020-02-08 ENCOUNTER — Telehealth (HOSPITAL_COMMUNITY): Payer: Self-pay

## 2020-02-08 NOTE — Telephone Encounter (Signed)
Pt insurance is active and benefits verified through [employer] A/B. Co-pay $0.00, DED $203.00/$203.00 met, out of pocket $0.00/$0.00 met, co-insurance 20%. No pre-authorization required. Passport, 02/08/20 @ 2:44PM, TCC#[id number]  2ndary insurance is active and benefits verified through [employer]. Co-pay $0.00, DED $1,500.00/$106.23 met, out of pocket $5,900.00/$914.36 met, co-insurance 30%. No pre-authorization required. Passport, 02/08/20 @ 2:48PM, YXA#[id number]  Will contact patient to see if he is interested in the Cardiac Rehab Program. If interested, patient will need to complete follow up appt. Once completed, patient will be contacted for scheduling upon review by the RN Navigator.

## 2020-02-08 NOTE — Telephone Encounter (Signed)
Called patient to see if he is interested in the Cardiac Rehab Program. Patient expressed interest. Explained scheduling process and went over insurance, patient verbalized understanding. Will contact patient for scheduling once f/u has been completed.  °

## 2020-02-10 ENCOUNTER — Other Ambulatory Visit: Payer: Self-pay | Admitting: Cardiothoracic Surgery

## 2020-02-10 ENCOUNTER — Ambulatory Visit (INDEPENDENT_AMBULATORY_CARE_PROVIDER_SITE_OTHER): Payer: Self-pay | Admitting: *Deleted

## 2020-02-10 ENCOUNTER — Other Ambulatory Visit: Payer: Self-pay

## 2020-02-10 VITALS — BP 124/71 | HR 94 | Resp 18

## 2020-02-10 DIAGNOSIS — Z951 Presence of aortocoronary bypass graft: Secondary | ICD-10-CM

## 2020-02-10 DIAGNOSIS — Z4802 Encounter for removal of sutures: Secondary | ICD-10-CM

## 2020-02-10 NOTE — Progress Notes (Signed)
Patient arrived for nurse visit to remove staples post-CABG with radial artery harvest 01/27/20.  Staples removed from chest incision and radial artery harvest site.  Radial incision reddened along harvest site. Dr. Vickey Sages applied steri-strips to arm. Patient tolerated staple removal well.  Patient and family instructed to keep the incision site clean and dry. Patient and family acknowledged instructions given.  All questions answered.

## 2020-02-13 ENCOUNTER — Ambulatory Visit
Admission: RE | Admit: 2020-02-13 | Discharge: 2020-02-13 | Disposition: A | Discharge status: Home / Self Care | Payer: BC Managed Care – PPO | Source: Ambulatory Visit | Attending: Cardiothoracic Surgery | Admitting: Cardiothoracic Surgery

## 2020-02-13 ENCOUNTER — Encounter: Payer: Self-pay | Admitting: Cardiothoracic Surgery

## 2020-02-13 ENCOUNTER — Ambulatory Visit (INDEPENDENT_AMBULATORY_CARE_PROVIDER_SITE_OTHER): Payer: Self-pay | Admitting: Cardiothoracic Surgery

## 2020-02-13 ENCOUNTER — Other Ambulatory Visit: Payer: Self-pay

## 2020-02-13 VITALS — BP 107/61 | HR 102 | Temp 98.4°F | Resp 18 | Ht 70.0 in | Wt 204.0 lb

## 2020-02-13 DIAGNOSIS — Z951 Presence of aortocoronary bypass graft: Secondary | ICD-10-CM

## 2020-02-13 MED ORDER — TRAMADOL HCL 50 MG PO TABS: 50.0000 mg | ORAL_TABLET | Freq: Four times a day (QID) | ORAL | 0 refills | Status: DC | PRN

## 2020-02-13 NOTE — Progress Notes (Signed)
[address]       [place] [address]             [phone]     CARDIOTHORACIC SURGERY OFFICE NOTE  Referring Provider is [name], Fransisca Kaufmann, MD Primary Cardiologist is Quay Burow, MD PCP is [name], Fransisca Kaufmann, MD   HPI:  54 yo man presents for outpatient evaluation after recent admit for CHF/NSTEMI and subsequent CABG. He did well after surgery. Came to office last week to have staples removed.  No complaints today.    Current Outpatient Medications  Medication Sig Dispense Refill  . aspirin EC 81 MG tablet Take 81 mg by mouth daily.    Marland Kitchen atorvastatin (LIPITOR) 80 MG tablet TAKE 1 TABLET BY MOUTH EVERY DAY 90 tablet 0  . blood glucose meter kit and supplies KIT Inject 1 each into the skin 4 (four) times daily as needed. Dispense based on patient and insurance preference. Use up to four times daily as directed. (FOR ICD-9 250.00, 250.01). 1 each 0  . carvedilol (COREG) 3.125 MG tablet Take 1 tablet (3.125 mg total) by mouth 2 (two) times daily with a meal. 60 tablet 1  . clopidogrel (PLAVIX) 75 MG tablet TAKE 1 TABLET BY MOUTH EVERY DAY IN THE EVENING 90 tablet 3  . colchicine 0.6 MG tablet Take 0.5 tablets (0.3 mg total) by mouth 2 (two) times daily. For 2 weeks then stop. 42 tablet 0  . DULoxetine (CYMBALTA) 60 MG capsule Take 1 capsule (60 mg total) by mouth daily. 90 capsule 3  . furosemide (LASIX) 80 MG tablet Take 1 tablet (80 mg total) by mouth 2 (two) times daily. 60 tablet 1  . glucose blood (ONETOUCH VERIO) test strip 1 each by Other route in the morning, at noon, in the evening, and at bedtime. use for testing 120 each 11  . hydrOXYzine (ATARAX/VISTARIL) 25 MG tablet Take 1 tablet (25 mg total) by mouth 3 (three) times daily as needed. 30 tablet 0  . insulin degludec (TRESIBA FLEXTOUCH) 200 UNIT/ML FlexTouch Pen Inject 116 Units into the skin daily. (Patient taking differently: Inject 160 Units into the skin daily. ) 26 mL 5  . Insulin Pen  Needle (B-D ULTRAFINE III SHORT PEN) 31G X 8 MM MISC USE TO INJECT INSULIN DAILY DX E11.9 100 each 3  . isosorbide dinitrate (ISORDIL) 10 MG tablet Take 1 tablet (10 mg total) by mouth 2 (two) times daily. Please take until finished 50 tablet 0  . mirtazapine (REMERON) 7.5 MG tablet TAKE 1 TABLET AT BEDTIME FOR DEPRESSION 90 tablet 3  . OneTouch Delica Lancets 09X MISC Test blood sugars three times daily 100 each 11  . oxyCODONE (OXY IR/ROXICODONE) 5 MG immediate release tablet Take 1 tablet (5 mg total) by mouth every 4 (four) hours as needed for severe pain. 30 tablet 0  . potassium chloride SA (KLOR-CON) 10 MEQ tablet Take 1 tablet (10 mEq total) by mouth daily. 30 tablet 1  . primidone (MYSOLINE) 50 MG tablet TAKE 1 TABLET BY MOUTH EVERYDAY AT BEDTIME (Patient taking differently: Take 50 mg by mouth at bedtime. ) 90 tablet 1  . Semaglutide,0.25 or 0.5MG/DOS, (OZEMPIC, 0.25 OR 0.5 MG/DOSE,) 2 MG/1.5ML SOPN Inject 0.5 mg into the skin once a week. (Patient taking differently: Inject 0.5 mg into the skin every 14 (fourteen) days. ) 4 pen 3  . tamsulosin (FLOMAX) 0.4 MG CAPS capsule TAKE 1 CAPSULE DAILY IN THE EVENING 90 capsule  3  . topiramate (TOPAMAX) 25 MG tablet Take 1 tablet (25 mg total) by mouth at bedtime as needed. 90 tablet 3  . traMADol (ULTRAM) 50 MG tablet Take 1 tablet (50 mg total) by mouth every 6 (six) hours as needed for moderate pain. 50 tablet 0   No current facility-administered medications for this visit.      Physical Exam:   BP 107/61 (BP Location: Right Arm, Patient Position: Sitting, Cuff Size: Normal)   Pulse (!) 102   Temp 98.4 F (36.9 C) (Skin)   Resp 18   Ht 5' 10"  (1.778 m)   Wt 92.5 kg   SpO2 98% Comment: RA  BMI 29.27 kg/m   General:  Well-appearing, NAD  Chest:   cta  CV:   rrr  Incisions:  Well-healed  Abdomen:  sntnd  Extremities:  No edema  Diagnostic Tests:  CXR with small left effusion   Impression:  Doing well after CABG for low  EF  Plan:  F/u in 2 weeks for wound check Tramadol prescription entered  I spent in excess of 15 minutes during the conduct of this office consultation and >50% of this time involved direct face-to-face encounter with the patient for counseling and/or coordination of their care.  Level 2                 10 minutes Level 3                 15 minutes Level 4                 25 minutes Level 5                 40 minutes  B. Murvin Natal, MD 02/13/2020 2:30 PM

## 2020-02-14 ENCOUNTER — Encounter: Payer: Self-pay | Admitting: Family Medicine

## 2020-02-14 ENCOUNTER — Ambulatory Visit (INDEPENDENT_AMBULATORY_CARE_PROVIDER_SITE_OTHER): Payer: Medicare Other | Admitting: Family Medicine

## 2020-02-14 VITALS — BP 148/80 | HR 98 | Temp 97.4°F | Ht 70.0 in | Wt 198.0 lb

## 2020-02-14 DIAGNOSIS — Z951 Presence of aortocoronary bypass graft: Secondary | ICD-10-CM | POA: Diagnosis not present

## 2020-02-14 DIAGNOSIS — F32A Depression, unspecified: Secondary | ICD-10-CM

## 2020-02-14 DIAGNOSIS — I255 Ischemic cardiomyopathy: Secondary | ICD-10-CM | POA: Diagnosis not present

## 2020-02-14 DIAGNOSIS — I25110 Atherosclerotic heart disease of native coronary artery with unstable angina pectoris: Secondary | ICD-10-CM

## 2020-02-14 DIAGNOSIS — I252 Old myocardial infarction: Secondary | ICD-10-CM | POA: Diagnosis not present

## 2020-02-14 DIAGNOSIS — F419 Anxiety disorder, unspecified: Secondary | ICD-10-CM | POA: Diagnosis not present

## 2020-02-14 MED ORDER — DULOXETINE HCL 60 MG PO CPEP: 120.0000 mg | ORAL_CAPSULE | Freq: Every day | ORAL | 3 refills | Status: DC

## 2020-02-14 MED ORDER — KETOROLAC TROMETHAMINE 30 MG/ML IJ SOLN
30.0000 mg | Freq: Once | INTRAMUSCULAR | Status: AC
  Administered 2020-02-14: 30 mg via INTRAMUSCULAR

## 2020-02-14 NOTE — Progress Notes (Signed)
BP (!) 148/80   Pulse 98   Temp (!) 97.4 F (36.3 C)   Ht _0  (1.778 m)   Wt 198 lb (89.8 kg)   SpO2 99%   BMI 28.41 kg/m    Subjective:   Patient ID: Douglas Carr, male    DOB: [birth year], 54 y.o.   MRN: [id number]  HPI: Douglas Carr is a 54 y.o. male presenting on 02/14/2020 for Hospitalization Follow-up (S/P CABG)   HPI Patient is coming in today for hospital follow-up. Patient was admitted to [facility] on 01/22/2020 and discharged on 02/04/2020 for NSTEMI and CAD.  He had a CABG x5 while he was there. Patient has seen Dr Orvan Seen.  Patient is still having a lot of chest tightness and pain and some shortness of breath since leaving the hospital although it is improved.  Patient says he is also being in a lot of anxiety associated with his breathing issues since he left the hospital.  He is following up with his cardiologist Dr. Julien Girt and was seen earlier in the week and will be seen again in a couple weeks to follow-up with him.  Relevant past medical, surgical, family and social history reviewed and updated as indicated. Interim medical history since our last visit reviewed. Allergies and medications reviewed and updated.  Review of Systems  Constitutional: Negative for chills and fever.  Eyes: Negative for visual disturbance.  Respiratory: Positive for shortness of breath. Negative for cough and wheezing.   Cardiovascular: Positive for chest pain. Negative for leg swelling.  Musculoskeletal: Negative for back pain and gait problem.  Skin: Negative for rash.  Neurological: Negative for dizziness.  All other systems reviewed and are negative.   Per HPI unless specifically indicated above   Allergies as of 02/14/2020   No Known Allergies     Medication List       Accurate as of February 14, 2020  2:29 PM. If you have any questions, ask your nurse or doctor.        STOP taking these medications   colchicine 0.6 MG tablet Stopped by: Fransisca Kaufmann  Mumtaz Lovins, MD   oxyCODONE 5 MG immediate release tablet Commonly known as: Oxy IR/ROXICODONE Stopped by: Fransisca Kaufmann Keimon Basaldua, MD     TAKE these medications   aspirin EC 81 MG tablet Take 81 mg by mouth daily.   atorvastatin 80 MG tablet Commonly known as: LIPITOR TAKE 1 TABLET BY MOUTH EVERY DAY   blood glucose meter kit and supplies Kit Inject 1 each into the skin 4 (four) times daily as needed. Dispense based on patient and insurance preference. Use up to four times daily as directed. (FOR ICD-9 250.00, 250.01).   carvedilol 3.125 MG tablet Commonly known as: COREG Take 1 tablet (3.125 mg total) by mouth 2 (two) times daily with a meal.   clopidogrel 75 MG tablet Commonly known as: PLAVIX TAKE 1 TABLET BY MOUTH EVERY DAY IN THE EVENING   DULoxetine 60 MG capsule Commonly known as: CYMBALTA Take 2 capsules (120 mg total) by mouth daily. What changed: how much to take Changed by: Worthy Rancher, MD   furosemide 80 MG tablet Commonly known as: Lasix Take 1 tablet (80 mg total) by mouth 2 (two) times daily. What changed:   how much to take  when to take this   hydrOXYzine 25 MG tablet Commonly known as: ATARAX/VISTARIL Take 1 tablet (25 mg total) by mouth 3 (three) times daily as needed.  Insulin Pen Needle 31G X 8 MM Misc Commonly known as: B-D ULTRAFINE III SHORT PEN USE TO INJECT INSULIN DAILY DX E11.9   isosorbide dinitrate 10 MG tablet Commonly known as: ISORDIL Take 1 tablet (10 mg total) by mouth 2 (two) times daily. Please take until finished   mirtazapine 7.5 MG tablet Commonly known as: REMERON TAKE 1 TABLET AT BEDTIME FOR DEPRESSION   OneTouch Delica Lancets 54O Misc Test blood sugars three times daily   OneTouch Verio test strip Generic drug: glucose blood 1 each by Other route in the morning, at noon, in the evening, and at bedtime. use for testing   Ozempic (0.25 or 0.5 MG/DOSE) 2 MG/1.5ML Sopn Generic drug: Semaglutide(0.25 or  0.5MG/DOS) Inject 0.5 mg into the skin once a week. What changed: when to take this   potassium chloride 10 MEQ tablet Commonly known as: KLOR-CON Take 1 tablet (10 mEq total) by mouth daily.   primidone 50 MG tablet Commonly known as: MYSOLINE TAKE 1 TABLET BY MOUTH EVERYDAY AT BEDTIME What changed: See the new instructions.   tamsulosin 0.4 MG Caps capsule Commonly known as: FLOMAX TAKE 1 CAPSULE DAILY IN THE EVENING   topiramate 25 MG tablet Commonly known as: Topamax Take 1 tablet (25 mg total) by mouth at bedtime as needed.   traMADol 50 MG tablet Commonly known as: Ultram Take 1 tablet (50 mg total) by mouth every 6 (six) hours as needed for moderate pain.   Tyler Aas FlexTouch 200 UNIT/ML FlexTouch Pen Generic drug: insulin degludec Inject 116 Units into the skin daily. What changed: how much to take        Objective:   BP (!) 148/80   Pulse 98   Temp (!) 97.4 F (36.3 C)   Ht _0  (1.778 m)   Wt 198 lb (89.8 kg)   SpO2 99%   BMI 28.41 kg/m   Wt Readings from Last 3 Encounters:  02/14/20 198 lb (89.8 kg)  02/13/20 204 lb (92.5 kg)  02/04/20 218 lb 4.1 oz (99 kg)    Physical Exam Vitals and nursing note reviewed.  Constitutional:      General: He is not in acute distress.    Appearance: He is well-developed. He is not diaphoretic.  Eyes:     General: No scleral icterus.    Conjunctiva/sclera: Conjunctivae normal.  Neck:     Thyroid: No thyromegaly.  Cardiovascular:     Rate and Rhythm: Normal rate and regular rhythm.     Heart sounds: Normal heart sounds. No murmur heard.   Pulmonary:     Effort: Pulmonary effort is normal. No respiratory distress.     Breath sounds: Normal breath sounds. No wheezing.  Musculoskeletal:        General: Normal range of motion.     Cervical back: Neck supple.  Lymphadenopathy:     Cervical: No cervical adenopathy.  Skin:    General: Skin is warm and dry.     Findings: No rash.     Comments: Chest wounds  healing well  Neurological:     Mental Status: He is alert and oriented to person, place, and time.     Coordination: Coordination normal.  Psychiatric:        Behavior: Behavior normal.       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, unspecified vessel or lesion type ([place])       Relevant Medications   ketorolac (TORADOL) 30 MG/ML injection 30 mg (Start on 02/14/2020  2:30 PM)   History of non-ST elevation myocardial infarction (NSTEMI)       Anxiety       Relevant Medications   DULoxetine (CYMBALTA) 60 MG capsule    Elevated age he said he had blood work done I did note it was probably the most important anyways reviewed hospital follow-up area perfect thank you for your every once in a while for some Ejigiri backside every once a low rate Patient has gotten pain management from his surgeon and now waiting for prior authorization with approval for that so and not wanting to change anything along those lines at this point.  Will increase Cymbalta to see if that can help a little more with his pain and also help with his anxiety.  Follow up plan: Return in about 1 month (around 03/16/2020), or if symptoms worsen or fail to improve, for Follow-up in about 1 month for anxiety.  Counseling provided for all of the vaccine components No orders of the defined types were placed in this encounter.   Caryl Pina, MD [facility] 02/14/2020, 2:29 PM

## 2020-02-16 ENCOUNTER — Telehealth: Payer: Self-pay

## 2020-02-16 ENCOUNTER — Telehealth: Payer: Self-pay | Admitting: *Deleted

## 2020-02-16 NOTE — Telephone Encounter (Signed)
VM from Joni Reining w/ [facility] Pt has had tremors / jerking motions for the last 2-3 days, wife states like several years ago when he had his stroke

## 2020-02-16 NOTE — Telephone Encounter (Signed)
Spoke with Joni Reining at [place]. Per pts wife AFTER stroke he started to experience tremors and shaking. Pt was given Klonopin at that time. He has not been on the medication in years. Per Joni Reining with [facility] pt has a low BP today and has had frequent diarrhea. Pt is eating and drinking. She states that he has no signs of a stroke.  Informed Joni Reining that I would address Klonopin with Dr. Louanne Skye.

## 2020-02-16 NOTE — Telephone Encounter (Signed)
Wife states that she was able to get pts pain meds last night and start them. She states that patient is still shaking terribly like he did here in the office during the visit. It has gotten worse.He is not able to eat or sleep. Wants to know if there is anything that can be done for that? Please advise

## 2020-02-16 NOTE — Telephone Encounter (Signed)
If he is having the symptoms and she is concerned that it may be similar to his stroke he may need to go to the emergency department again.  If he is just having this because of pain then that is different but if he is having something that similar to seizures he needs to go to the emergency department.

## 2020-02-17 ENCOUNTER — Other Ambulatory Visit: Payer: Self-pay | Admitting: Family Medicine

## 2020-02-17 ENCOUNTER — Other Ambulatory Visit: Payer: Self-pay | Admitting: Neurology

## 2020-02-17 ENCOUNTER — Other Ambulatory Visit: Payer: Self-pay | Admitting: Physician Assistant

## 2020-02-17 DIAGNOSIS — F32A Depression, unspecified: Secondary | ICD-10-CM

## 2020-02-17 DIAGNOSIS — E782 Mixed hyperlipidemia: Secondary | ICD-10-CM

## 2020-02-17 DIAGNOSIS — F419 Anxiety disorder, unspecified: Secondary | ICD-10-CM

## 2020-02-17 NOTE — Telephone Encounter (Signed)
Aware and verbalizes understanding.  

## 2020-02-17 NOTE — Telephone Encounter (Signed)
Patient is not on klonipin because of failed drug screen, recommend to go to neurology to discuss tremors further

## 2020-02-20 ENCOUNTER — Telehealth: Payer: Self-pay | Admitting: *Deleted

## 2020-02-20 MED ORDER — ATORVASTATIN CALCIUM 80 MG PO TABS: 80.0000 mg | ORAL_TABLET | Freq: Every day | ORAL | 0 refills | Status: DC

## 2020-02-20 MED ORDER — POTASSIUM CHLORIDE ER 10 MEQ PO TBCR: 10.0000 meq | EXTENDED_RELEASE_TABLET | Freq: Every day | ORAL | 0 refills | Status: DC

## 2020-02-20 MED ORDER — ASPIRIN 81 MG PO TBEC
81.0000 mg | DELAYED_RELEASE_TABLET | Freq: Every day | ORAL | 0 refills | Status: DC
Start: 2020-02-20 — End: 2020-05-01

## 2020-02-20 MED ORDER — MIRTAZAPINE 7.5 MG PO TABS: ORAL_TABLET | ORAL | 0 refills | Status: DC

## 2020-02-20 MED ORDER — TOPIRAMATE 25 MG PO TABS
25.0000 mg | ORAL_TABLET | Freq: Every evening | ORAL | 0 refills | Status: DC | PRN
Start: 2020-02-20 — End: 2020-03-15

## 2020-02-20 MED ORDER — HYDROXYZINE HCL 25 MG PO TABS
25.0000 mg | ORAL_TABLET | Freq: Three times a day (TID) | ORAL | 0 refills | Status: DC | PRN
Start: 2020-02-20 — End: 2020-05-01

## 2020-02-20 MED ORDER — COLCHICINE 0.6 MG PO TABS
ORAL_TABLET | ORAL | 3 refills | Status: DC
Start: 2020-02-20 — End: 2020-02-22

## 2020-02-20 MED ORDER — CLOPIDOGREL BISULFATE 75 MG PO TABS: ORAL_TABLET | ORAL | 0 refills | Status: DC

## 2020-02-20 NOTE — Telephone Encounter (Signed)
E-prescribe down. resent 

## 2020-02-20 NOTE — Addendum Note (Signed)
Addended by: Julious Payer D on: 02/20/2020 09:58 AM   Modules accepted: Orders

## 2020-02-20 NOTE — Telephone Encounter (Signed)
VM from [facility] nurse w/ [facility] Pt has had diarrhea for last 4 days, several episodes a day, mucous Wife is keeping him hydrated w/ gatorade and pedialyte, nurse also suggested a probiotic Wondering if patient needed a C-Diff work up Please advise

## 2020-02-20 NOTE — Telephone Encounter (Signed)
Yes go with stool study a GI stool panel and C. difficile.

## 2020-02-21 ENCOUNTER — Telehealth: Payer: Self-pay

## 2020-02-21 DIAGNOSIS — R197 Diarrhea, unspecified: Secondary | ICD-10-CM

## 2020-02-21 DIAGNOSIS — E118 Type 2 diabetes mellitus with unspecified complications: Secondary | ICD-10-CM

## 2020-02-21 DIAGNOSIS — E1169 Type 2 diabetes mellitus with other specified complication: Secondary | ICD-10-CM

## 2020-02-21 MED ORDER — OZEMPIC (0.25 OR 0.5 MG/DOSE) 2 MG/1.5ML ~~LOC~~ SOPN: 0.5000 mg | PEN_INJECTOR | SUBCUTANEOUS | 0 refills | Status: DC

## 2020-02-21 MED ORDER — BD PEN NEEDLE SHORT U/F 31G X 8 MM MISC
3 refills | Status: DC
Start: 2020-02-21 — End: 2023-12-04

## 2020-02-21 NOTE — Progress Notes (Signed)
Cardiology Office Note:    Date:  02/22/2020   ID:  [name], DOB 1965/09/02, MRN [id number]  PCP:  [name], Fransisca Kaufmann, MD  [facility] Cardiologist:  Quay Burow, MD   [facility] Electrophysiologist:  Will Meredith Leeds, MD   Referring MD: [name], Fransisca Kaufmann, MD   Chief Complaint:  Hospitalization Follow-up (S/p non-STEMI>> s/p CABG)    Patient Profile:    HENRIQUE PAREKH is a 54 y.o. male with:   Coronary artery disease  S/p myocardial infarction at age 88, medical therapy  S/p STEMI 1/18 >> PCI: DES to the OM2  S/p VT/VF cardiac arrest 1/18 (after DC) >> CPR (10 mins)/AED >> ROSC  Re-look cath w oLCx 70 and focal 90 prob prox edge dissection w thrombus >> PCI DES overlapping previous stent  S/p NSTEMI >> s/p CABG in 7/79   Systolic CHF  Ischemic cardiomyopathy  EF 30%  Echo 12/19: EF 35-40, GR 1 DD  Echo 9/21: EF 30  Intraoperative TEE 9/21: EF 40-45  S/p AICD  Hypertension  Diabetes mellitus  Poorly controlled, Hgb A1c 14 (01/2020)  Hyperlipidemia  Carotid artery disease  S/p R CEA  [place] in 2/18 w acute thrombus in R prox CCA (occluded CEA) >> occurred at time of VT/VF in 1/18 >> no intervention   Prior CV studies: Intraoperative TEE 01/27/2020 Post bypass EF improved at 40-45  Carotid US 01/26/2020 R ICA occluded; L ICA 40-59; R vertebral occluded, L vertebral retrograde flow  Cardiac catheterization 01/24/2020 LM patent LAD proximal-mid 60-70 (DFR 0.7) LCx ostial to mid diffuse 99 ISR RCA ostial 95 LVEDP 33; mean PA P 40; PCWP mean 30  Echocardiogram 01/23/2020 EF 30, GR 3 DD, normal RVSF, moderate LAE, small pericardial effusion, moderate left pleural effusion, mild MR   History of Present Illness:    Mr. Stankey was last seen by Dr. Gwenlyn Found in 3/21 via telemedicine.    He was recently admitted 9/19-10/2 with decompensated systolic heart failure in the setting of a non-STEMI.  He required aggressive diuresis  as well as vasopressors for hypotension.  He ultimately underwent cardiac catheterization which demonstrated three-vessel CAD with significant in-stent restenosis in the LCx/OM.  He underwent CABG with Dr. Orvan Seen 01/27/2020 (LIMA-LAD, SVG-PDA/RPL, left radial-OM, free RIMA-D1).  Postoperative course was notable for acute kidney injury with creatinine as high as 2.33.  His creatinine improved somewhat to 2.03 prior to discharge.  The patient declined inpatient rehab and was set up for home health PT and OT.  His ACE inhibitor was not resumed due to renal insufficiency.  He has followed up with Dr. Orvan Seen since discharge.  He returns for follow-up.  He is here today with his wife.  He has been working with home health physical therapy and increasing his activity.  He has been somewhat limited over the past several days by diarrhea.  He notes loose stools with evidence of mucus.  He has not had any bleeding.  He has followed up with primary care and has a stool test pending.  His appetite has been good.  His sugars were in the 50s this morning and he did not take his insulin.  He is sleeping well.  His chest soreness is improving.  He has not had orthopnea, significant shortness of breath with exertion.  He has been dizzy at times but has not had syncope.  Past Medical History:  Diagnosis Date  . Acute ST elevation myocardial infarction (STEMI) involving left circumflex coronary artery (  [place]) 05/07/2016   PCI to Cx-OM  . Anginal pain ([place])    secondary to sm. vessel disease  . Anxiety   . Arthritis    BIL KNEE PAIN AND BIL ANKLE PAIN  . Bone spur of ankle   . Cardiac arrest ([place]) 05/24/2016   with v fib  . 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 Coronary artery disease involving native coronary artery of native heart with angina pectoris ([place]) 05/24/2016   Remote MI at 54 years of age, Last cath 2010-diffuse non-obstructive disease; last echo 06/16/08  -normal LV function, moderate concentric hypertrophy; nuc 08/2008 no ischemia;  medical therapy; STEMI May 07 2016 - PCI to Cx-OM  . Diabetes mellitus    ON ORAL MEDICATION AND INSULIN  . Dilated cardiomyopathy ([place]) 05/24/2016   EF 325-30% by Echo post STEMI (previously 30-35%)   . Hyperlipidemia   . Hypertension   . Myocardial infarction ([place]) 1996   Post MI  . Peripheral vascular disease ([place])    HAS LEFT CAROTID ARTERY STENOSIS   AND IS S/P RIGHT CAROTID ENDARTERECTOMY 2010 Last carotid dopplers 01/08/2012 wth patent endarterectomy site  . Status post coronary artery stent placement     Current Medications: Current Meds  Medication Sig  . aspirin (ASPIRIN LOW DOSE) 81 MG EC tablet Take 1 tablet (81 mg total) by mouth daily.  Marland Kitchen atorvastatin (LIPITOR) 80 MG tablet Take 1 tablet (80 mg total) by mouth daily.  . blood glucose meter kit and supplies KIT Inject 1 each into the skin 4 (four) times daily as needed. Dispense based on patient and insurance preference. Use up to four times daily as directed. (FOR ICD-9 250.00, 250.01).  . carvedilol (COREG) 3.125 MG tablet Take 1 tablet (3.125 mg total) by mouth 2 (two) times daily with a meal. Pt needs to keep upcoming appt in Oct for further refills  . clopidogrel (PLAVIX) 75 MG tablet TAKE 1 TABLET BY MOUTH EVERY DAY IN THE EVENING  . DULoxetine (CYMBALTA) 60 MG capsule Take 2 capsules (120 mg total) by mouth daily.  . furosemide (LASIX) 40 MG tablet Take 1 tablet (40 mg total) by mouth daily.  Marland Kitchen glucose blood (ONETOUCH VERIO) test strip 1 each by Other route in the morning, at noon, in the evening, and at bedtime. use for testing  . hydrOXYzine (ATARAX/VISTARIL) 25 MG tablet Take 1 tablet (25 mg total) by mouth 3 (three) times daily as needed.  . insulin degludec (TRESIBA FLEXTOUCH) 200 UNIT/ML FlexTouch Pen Inject 116 Units into the skin daily. (Patient taking differently: Inject 160 Units into the skin daily. )  . Insulin Pen Needle (B-D  ULTRAFINE III SHORT PEN) 31G X 8 MM MISC USE TO INJECT INSULIN DAILY DX E11.9  . isosorbide dinitrate (ISORDIL) 10 MG tablet Take 1 tablet (10 mg total) by mouth 2 (two) times daily. Pt needs to keep upcoming appt in Oct for further refills  . mirtazapine (REMERON) 7.5 MG tablet TAKE 1 TABLET AT BEDTIME FOR DEPRESSION  . OneTouch Delica Lancets 14D MISC Test blood sugars three times daily  . primidone (MYSOLINE) 50 MG tablet TAKE 1 TABLET BY MOUTH EVERYDAY AT BEDTIME (Patient taking differently: Take 50 mg by mouth at bedtime. )  . Semaglutide,0.25 or 0.5MG/DOS, (OZEMPIC, 0.25 OR 0.5 MG/DOSE,) 2 MG/1.5ML SOPN Inject 0.5 mg into the skin once a week.  . tamsulosin (FLOMAX) 0.4 MG CAPS capsule TAKE 1 CAPSULE DAILY IN THE  EVENING  . topiramate (TOPAMAX) 25 MG tablet Take 1 tablet (25 mg total) by mouth at bedtime as needed.  . traMADol (ULTRAM) 50 MG tablet Take 1 tablet (50 mg total) by mouth every 6 (six) hours as needed for moderate pain.  . [DISCONTINUED] furosemide (LASIX) 80 MG tablet Take 1 tablet (80 mg total) by mouth daily. Pt needs to keep upcoming appt in Oct for further refills  . [DISCONTINUED] potassium chloride (KLOR-CON) 10 MEQ tablet Take 1 tablet (10 mEq total) by mouth daily.     Allergies:   Patient has no known allergies.   Social History   Tobacco Use  . Smoking status: Former Smoker    Packs/day: 2.00    Years: 22.00    Pack years: 44.00    Types: Cigarettes    Quit date: 10/23/2011    Years since quitting: 8.3  . Smokeless tobacco: Current User    Types: Snuff  Vaping Use  . Vaping Use: Never used  Substance Use Topics  . Alcohol use: No  . Drug use: No     Family Hx: The patient's family history includes Diabetes in his mother; Heart attack in his paternal grandfather; Heart attack (age of onset: 79) in his father; Hypertension in his maternal grandfather and mother.  Review of Systems  Constitutional: Negative for fever.  Gastrointestinal: Positive for  diarrhea. Negative for hematochezia and melena.  Genitourinary: Negative for hematuria.     EKGs/Labs/Other Test Reviewed:    EKG:  EKG is   ordered today.  The ekg ordered today demonstrates normal sinus rhythm, HR 96, left axis deviation, T wave inversions 1, aVL, poor R wave progression, QTC 467, similar to prior tracings  Recent Labs: 01/22/2020: ALT 38; B Natriuretic Peptide 788.0 01/28/2020: Magnesium 2.5 02/03/2020: Hemoglobin 8.6; Platelets 419 02/04/2020: BUN 46; Creatinine, Ser 2.03; Potassium 4.8; Sodium 132   Recent Lipid Panel Lab Results  Component Value Date/Time   CHOL 150 09/27/2019 02:59 PM   TRIG 261 (H) 09/27/2019 02:59 PM   HDL 34 (L) 09/27/2019 02:59 PM   CHOLHDL 4.4 09/27/2019 02:59 PM   CHOLHDL 4.7 05/24/2016 03:37 PM   LDLCALC 73 09/27/2019 02:59 PM      Risk Assessment/Calculations:      Physical Exam:    VS:  BP 126/72   Pulse 96   Ht _0  (1.778 m)   Wt 187 lb 12.8 oz (85.2 kg)   SpO2 100%   BMI 26.95 kg/m     Wt Readings from Last 3 Encounters:  02/22/20 187 lb 12.8 oz (85.2 kg)  02/14/20 198 lb (89.8 kg)  02/13/20 204 lb (92.5 kg)     Constitutional:      Appearance: Healthy appearance. Not in distress.  Neck:     Vascular: JVD normal.  Pulmonary:     Effort: Pulmonary effort is normal.     Breath sounds: No wheezing. No rales.  Cardiovascular:     Normal rate. Regular rhythm. Normal S1. Normal S2.     Murmurs: There is no murmur.     No rub.     Comments: Median sternotomy well-healed without erythema or discharge Edema:    Peripheral edema absent.  Abdominal:     Palpations: Abdomen is soft.  Skin:    General: Skin is warm and dry.  Neurological:     General: No focal deficit present.     Mental Status: Alert and oriented to person, place and time.  Cranial Nerves: Cranial nerves are intact.      ASSESSMENT & PLAN:    1. NSTEMI (non-ST elevated myocardial infarction) ([place]) He is status post recent non-STEMI in  the setting of decompensated heart failure.  Cardiac catheterization demonstrated three-vessel disease and he underwent CABG with Dr. Orvan Seen.  He seems to be progressing well since his surgery.  He is working with physical therapy and occupational therapy at home and seems to be getting stronger.  I have encouraged him to pursue cardiac rehabilitation once he has completed home health PT and OT.  Continue dual antiplatelet therapy with aspirin and clopidogrel given recent NSTEMI.  Continue beta-blocker with carvedilol.  He is not on ACE inhibitor due to worsening renal function.  Continue atorvastatin.  2. Chronic systolic heart failure (HCC) EF 30%.  Ischemic cardiomyopathy.  NYHA II-IIb.  Volume status appears stable.  Of note, his intraoperative TEE did demonstrate somewhat improved LV function with an EF of 40-45.  As noted, he has had diarrhea since late last week.  His weight has come down 10 pounds since he does have some lightheadedness at times.  He is likely somewhat dehydrated given volume loss from diarrhea in the setting of ongoing diuretic therapy.  Therefore, I have asked him to decrease his furosemide to 40 mg daily.  I will stop his potassium.  Check a BMET today.  We discussed the importance of daily weights.  I have instructed him to call if he gains 3 pounds in a day or notices leg swelling or worsening shortness of breath.  I will bring him back in close follow-up in the next 2 weeks.  I will hold off on any adjustments in medical therapy given current symptoms.  3. Diabetes mellitus with complication (HCC) Uncontrolled.  Continue follow-up with primary care.  Of note, he did have hypoglycemia this morning.  I have instructed him to follow-up with primary care sooner if he continues to have episodes of hypoglycemia.  4. Stage 3b chronic kidney disease ([place]) As noted, he had worsening renal function post bypass.  This did improve somewhat prior to discharge.  Obtain follow-up BMET  today.  5. ICD (implantable cardioverter-defibrillator) in place Continue follow-up with EP as planned.  6. Essential hypertension The patient's blood pressure is controlled on his current regimen.  Continue current therapy.   7. Hyperlipidemia, unspecified hyperlipidemia type Continue high intensity statin therapy.  He was previously not taking this on a regular basis.  Arrange fasting CMET, lipids in 8 weeks.  8. Bilateral carotid artery stenosis History of right CEA with known occluded right carotid artery.  9. Diarrhea, unspecified type Continue follow-up with primary care.  Etiology is not entirely clear.  He does have a stool test pending.  I have advised him to be cautious with his volume once his diarrhea has resolved.  We may need to increase his furosemide back to 80 mg daily at that point.    Dispo:  Return in about 2 weeks (around 03/07/2020) for Close Follow Up with Dr. Gwenlyn Found, or PA/NP, in person.   Medication Adjustments/Labs and Tests Ordered: Current medicines are reviewed at length with the patient today.  Concerns regarding medicines are outlined above.  Tests Ordered: Orders Placed This Encounter  Procedures  . Basic metabolic panel  . Comprehensive metabolic panel  . Lipid panel  . EKG 12-Lead   Medication Changes: Meds ordered this encounter  Medications  . furosemide (LASIX) 40 MG tablet    Sig: Take 1  tablet (40 mg total) by mouth daily.    Dispense:  90 tablet    Refill:  3    Signed, Richardson Dopp, PA-C  02/22/2020 9:27 AM    [facility] [address], [place], [place]  [address] Phone: [phone]; Fax: [phone]

## 2020-02-21 NOTE — Telephone Encounter (Signed)
Refill sent to pharmacy.   

## 2020-02-21 NOTE — Telephone Encounter (Signed)
[facility] pharmacy needs new RX for Ozempic and Pin Needles.

## 2020-02-21 NOTE — Telephone Encounter (Signed)
Home health nurse aware. Kit placed at front desk for her to pick up. Orders placed in another encounter.

## 2020-02-21 NOTE — Telephone Encounter (Signed)
lmtcb

## 2020-02-22 ENCOUNTER — Ambulatory Visit (INDEPENDENT_AMBULATORY_CARE_PROVIDER_SITE_OTHER): Payer: Medicare Other | Admitting: Physician Assistant

## 2020-02-22 ENCOUNTER — Other Ambulatory Visit: Payer: Self-pay

## 2020-02-22 ENCOUNTER — Encounter: Payer: Self-pay | Admitting: Physician Assistant

## 2020-02-22 VITALS — BP 126/72 | HR 96 | Ht 70.0 in | Wt 187.8 lb

## 2020-02-22 DIAGNOSIS — I5022 Chronic systolic (congestive) heart failure: Secondary | ICD-10-CM

## 2020-02-22 DIAGNOSIS — R197 Diarrhea, unspecified: Secondary | ICD-10-CM

## 2020-02-22 DIAGNOSIS — I255 Ischemic cardiomyopathy: Secondary | ICD-10-CM | POA: Diagnosis not present

## 2020-02-22 DIAGNOSIS — E118 Type 2 diabetes mellitus with unspecified complications: Secondary | ICD-10-CM

## 2020-02-22 DIAGNOSIS — I6523 Occlusion and stenosis of bilateral carotid arteries: Secondary | ICD-10-CM

## 2020-02-22 DIAGNOSIS — I214 Non-ST elevation (NSTEMI) myocardial infarction: Secondary | ICD-10-CM

## 2020-02-22 DIAGNOSIS — E785 Hyperlipidemia, unspecified: Secondary | ICD-10-CM

## 2020-02-22 DIAGNOSIS — N1832 Chronic kidney disease, stage 3b: Secondary | ICD-10-CM

## 2020-02-22 DIAGNOSIS — Z95810 Presence of automatic (implantable) cardiac defibrillator: Secondary | ICD-10-CM

## 2020-02-22 DIAGNOSIS — I10 Essential (primary) hypertension: Secondary | ICD-10-CM

## 2020-02-22 LAB — BASIC METABOLIC PANEL
BUN/Creatinine Ratio: 13 (ref 9–20)
BUN: 22 mg/dL (ref 6–24)
CO2: 28 mmol/L (ref 20–29)
Calcium: 9.7 mg/dL (ref 8.7–10.2)
Chloride: 99 mmol/L (ref 96–106)
Creatinine, Ser: 1.63 mg/dL — ABNORMAL HIGH (ref 0.76–1.27)
GFR calc Af Amer: 55 mL/min/{1.73_m2} — ABNORMAL LOW (ref >59)
GFR calc non Af Amer: 47 mL/min/{1.73_m2} — ABNORMAL LOW (ref >59)
Glucose: 67 mg/dL (ref 65–99)
Potassium: 4.7 mmol/L (ref 3.5–5.2)
Sodium: 142 mmol/L (ref 134–144)

## 2020-02-22 MED ORDER — FUROSEMIDE 40 MG PO TABS
40.0000 mg | ORAL_TABLET | Freq: Every day | ORAL | 3 refills | Status: DC
Start: 2020-02-22 — End: 2021-01-24

## 2020-02-22 NOTE — Patient Instructions (Addendum)
Medication Instructions:  Your physician has recommended you make the following change in your medication:   1) Decrease Furosemide to 40 mg, 1 tablet by mouth once a day 2) Stop Potassium  *If you need a refill on your cardiac medications before your next appointment, please call your pharmacy*  Lab Work: You will have labs drawn today: BMET  Your physician recommends that you return for lab work in: 8 weeks on 04/18/20 **The lab is open from 7:30AM-4:30PM** you may come anytime between these hours, come fasting your cholesterol will be checked.   Testing/Procedures: None ordered today  Follow-Up: At [facility], you and your health needs are our priority.  As part of our continuing mission to provide you with exceptional heart care, we have created designated Provider Care Teams.  These Care Teams include your primary Cardiologist (physician) and Advanced Practice Providers (APPs -  Physician Assistants and Nurse Practitioners) who all work together to provide you with the care you need, when you need it.  Your next appointment:   2 week(s)  The format for your next appointment:   In Person  Provider:   You may see Nanetta Batty, MD or one of the following Advanced Practice Providers on your designated Care Team:    Corine Shelter, PA-C  [place], PA-C  Edd Fabian, [place]  [name], PA-C  Other Instructions Weigh yourself daily. Call if your weight goes up 3 pounds or more in one day. Call the office if your leg swelling or shortness of breath gets worse/increases.

## 2020-02-27 ENCOUNTER — Other Ambulatory Visit: Payer: Self-pay

## 2020-02-27 ENCOUNTER — Ambulatory Visit: Payer: Self-pay | Admitting: Cardiothoracic Surgery

## 2020-02-27 ENCOUNTER — Encounter: Payer: Self-pay | Admitting: Cardiothoracic Surgery

## 2020-02-27 VITALS — BP 94/66 | HR 107 | Resp 20 | Ht 70.0 in

## 2020-02-27 DIAGNOSIS — Z5189 Encounter for other specified aftercare: Secondary | ICD-10-CM

## 2020-02-28 ENCOUNTER — Encounter: Payer: Self-pay | Admitting: Cardiothoracic Surgery

## 2020-02-28 NOTE — Progress Notes (Signed)
54 year old gentleman status post CABG and returns for another outpatient visit.  He has no specific complaints today.  His activity level has been improving and he denies chest pain or shortness of breath  Physical exam: BP 94/66 (BP Location: Right Arm, Patient Position: Sitting)   Pulse (!) 107   Resp 20   Ht 5\' 10"  (1.778 m)   SpO2 94%   BMI 26.95 kg/m  Well-appearing, no acute distress Neurologically intact Incisions well-healed Chest clear to auscultation Heart regular rate and rhythm without murmur Peripheral: No edema  Imaging: No new studies  Impression: Doing well after CABG  Plan: Follow-up with thoracic surgery as needed Follow-up with advanced heart failure cardiology  Douglas Devera Z. , MD [phone]

## 2020-02-29 ENCOUNTER — Telehealth: Payer: Self-pay | Admitting: *Deleted

## 2020-02-29 NOTE — Telephone Encounter (Signed)
Vm from [place] OT w/ [facility] Pt has increased dizziness with several episodes, to where he has had to sit down on the floor BP during visit was 122/56, then hard to hear once he stood up, he is barely able to walk down hall now

## 2020-03-02 NOTE — Telephone Encounter (Signed)
Lmtcb.

## 2020-03-02 NOTE — Telephone Encounter (Signed)
Have him make sure he stays hydrated and go ahead and hold his isosorbide dinitrate and keep an eye on it but hopefully that allows his blood pressures to come up.

## 2020-03-06 ENCOUNTER — Telehealth: Payer: Self-pay | Admitting: Physician Assistant

## 2020-03-06 NOTE — Telephone Encounter (Signed)
Spoke with pharmacy and confirmed patient has been prescribed to take lasix 40 mg daily.

## 2020-03-06 NOTE — Telephone Encounter (Signed)
Pt c/o medication issue:  1. Name of Medication: furosemide (LASIX) 40 MG tablet  2. How are you currently taking this medication (dosage and times per day)? Pharmacy is not sure   3. Are you having a reaction (difficulty breathing--STAT)? no  4. What is your medication issue? Pharmacy needs clarification of dosage. Please advise

## 2020-03-15 ENCOUNTER — Encounter: Payer: Self-pay | Admitting: Family Medicine

## 2020-03-15 ENCOUNTER — Ambulatory Visit (INDEPENDENT_AMBULATORY_CARE_PROVIDER_SITE_OTHER): Payer: Medicare Other | Admitting: Family Medicine

## 2020-03-15 ENCOUNTER — Other Ambulatory Visit: Payer: Self-pay

## 2020-03-15 VITALS — BP 141/92 | HR 115 | Temp 97.9°F | Ht 70.0 in | Wt 185.0 lb

## 2020-03-15 DIAGNOSIS — I255 Ischemic cardiomyopathy: Secondary | ICD-10-CM

## 2020-03-15 DIAGNOSIS — E118 Type 2 diabetes mellitus with unspecified complications: Secondary | ICD-10-CM

## 2020-03-15 DIAGNOSIS — I10 Essential (primary) hypertension: Secondary | ICD-10-CM

## 2020-03-15 DIAGNOSIS — E1169 Type 2 diabetes mellitus with other specified complication: Secondary | ICD-10-CM | POA: Diagnosis not present

## 2020-03-15 DIAGNOSIS — F419 Anxiety disorder, unspecified: Secondary | ICD-10-CM

## 2020-03-15 DIAGNOSIS — F32A Depression, unspecified: Secondary | ICD-10-CM

## 2020-03-15 DIAGNOSIS — Z23 Encounter for immunization: Secondary | ICD-10-CM | POA: Diagnosis not present

## 2020-03-15 DIAGNOSIS — E782 Mixed hyperlipidemia: Secondary | ICD-10-CM

## 2020-03-15 DIAGNOSIS — E669 Obesity, unspecified: Secondary | ICD-10-CM

## 2020-03-15 MED ORDER — TOPIRAMATE 50 MG PO TABS
50.0000 mg | ORAL_TABLET | Freq: Every evening | ORAL | 3 refills | Status: DC | PRN
Start: 2020-03-15 — End: 2021-01-25

## 2020-03-15 NOTE — Progress Notes (Signed)
BP (!) 141/92   Pulse (!) 115   Temp 97.9 F (36.6 C)   Ht _0  (1.778 m)   Wt 185 lb (83.9 kg)   SpO2 99%   BMI 26.54 kg/m    Subjective:   Patient ID: Douglas Carr, male    DOB: 06-Nov-1965, 54 y.o.   MRN: [id number]  HPI: Douglas Carr is a 54 y.o. male presenting on 03/15/2020 for Medical Management of Chronic Issues and Anxiety   HPI Patient is coming in today for anxiety.  He says the med helps somewhat and some of the shaking but he still having some troubles.  He is not seeing much benefit with sleeping.  The Topamax was the new medicine we added.  He is still taking the Cymbalta and mirtazapine.  Patient denies any suicidal ideations or thoughts of hurting self.  He does feel like he is doing a lot better.  Relevant past medical, surgical, family and social history reviewed and updated as indicated. Interim medical history since our last visit reviewed. Allergies and medications reviewed and updated.  Review of Systems  Constitutional: Negative for chills and fever.  Respiratory: Negative for shortness of breath and wheezing.   Cardiovascular: Negative for chest pain and leg swelling.  Musculoskeletal: Negative for back pain and gait problem.  Skin: Negative for rash.  Neurological: Positive for tremors. Negative for dizziness and light-headedness.  Psychiatric/Behavioral: Positive for dysphoric mood and sleep disturbance. Negative for self-injury and suicidal ideas. The patient is nervous/anxious.   All other systems reviewed and are negative.   Per HPI unless specifically indicated above   Allergies as of 03/15/2020   No Known Allergies     Medication List       Accurate as of March 15, 2020  2:25 PM. If you have any questions, ask your nurse or doctor.        aspirin 81 MG EC tablet Commonly known as: Aspirin Low Dose Take 1 tablet (81 mg total) by mouth daily.   atorvastatin 80 MG tablet Commonly known as: LIPITOR Take 1 tablet  (80 mg total) by mouth daily.   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 Dispense based on patient and insurance preference. Use up to four times daily as directed. (FOR ICD-9 250.00, 250.01).   carvedilol 3.125 MG tablet Commonly known as: COREG Take 1 tablet (3.125 mg total) by mouth 2 (two) times daily with a meal. Pt needs to keep upcoming appt in Oct for further refills   clopidogrel 75 MG tablet Commonly known as: PLAVIX TAKE 1 TABLET BY MOUTH EVERY DAY IN THE EVENING   DULoxetine 60 MG capsule Commonly known as: CYMBALTA Take 2 capsules (120 mg total) by mouth daily.   furosemide 40 MG tablet Commonly known as: LASIX Take 1 tablet (40 mg total) by mouth daily.   hydrOXYzine 25 MG tablet Commonly known as: ATARAX/VISTARIL Take 1 tablet (25 mg total) by mouth 3 (three) times daily as needed.   isosorbide dinitrate 10 MG tablet Commonly known as: ISORDIL Take 1 tablet (10 mg total) by mouth 2 (two) times daily. Pt needs to keep upcoming appt in Oct for further refills   mirtazapine 7.5 MG tablet Commonly known as: REMERON TAKE 1 TABLET AT BEDTIME FOR DEPRESSION  OneTouch Delica Lancets 12R Misc Test blood sugars three times daily   OneTouch Verio test strip Generic drug: glucose blood 1 each by Other route in the morning, at noon, in the evening, and at bedtime. use for testing   Ozempic (0.25 or 0.5 MG/DOSE) 2 MG/1.5ML Sopn Generic drug: Semaglutide(0.25 or 0.5MG/DOS) Inject 0.5 mg into the skin once a week.   primidone 50 MG tablet Commonly known as: MYSOLINE TAKE 1 TABLET BY MOUTH EVERYDAY AT BEDTIME What changed: See the new instructions.   tamsulosin 0.4 MG Caps capsule Commonly known as: FLOMAX TAKE 1 CAPSULE DAILY IN THE EVENING   topiramate 50 MG tablet Commonly known as:  TOPAMAX Take 1 tablet (50 mg total) by mouth at bedtime as needed. What changed:   medication strength  how much to take Changed by: Worthy Rancher, MD   traMADol 50 MG tablet Commonly known as: Ultram Take 1 tablet (50 mg total) by mouth every 6 (six) hours as needed for moderate pain.   Tyler Aas FlexTouch 200 UNIT/ML FlexTouch Pen Generic drug: insulin degludec Inject 116 Units into the skin daily. What changed: how much to take        Objective:   BP (!) 141/92   Pulse (!) 115   Temp 97.9 F (36.6 C)   Ht _0  (1.778 m)   Wt 185 lb (83.9 kg)   SpO2 99%   BMI 26.54 kg/m   Wt Readings from Last 3 Encounters:  03/15/20 185 lb (83.9 kg)  02/22/20 187 lb 12.8 oz (85.2 kg)  02/14/20 198 lb (89.8 kg)    Physical Exam Vitals and nursing note reviewed.  Constitutional:      General: He is not in acute distress.    Appearance: He is well-developed. He is not diaphoretic.  Eyes:     General: No scleral icterus.    Conjunctiva/sclera: Conjunctivae normal.  Neck:     Thyroid: No thyromegaly.  Cardiovascular:     Rate and Rhythm: Normal rate and regular rhythm.     Heart sounds: Normal heart sounds. No murmur heard.   Pulmonary:     Effort: Pulmonary effort is normal. No respiratory distress.     Breath sounds: Normal breath sounds. No wheezing.  Musculoskeletal:        General: Normal range of motion.     Cervical back: Neck supple.  Lymphadenopathy:     Cervical: No cervical adenopathy.  Skin:    General: Skin is warm and dry.     Findings: No rash.  Neurological:     Mental Status: He is alert and oriented to person, place, and time.     Coordination: Coordination normal.  Psychiatric:        Behavior: Behavior normal.       Assessment & Plan:   Problem List Items Addressed This Visit      Cardiovascular and Mediastinum   Essential hypertension (Chronic)     Endocrine   Diabetes mellitus type 2 in obese (HCC)   Diabetes mellitus with  complication ([place])     Other   Anxiety and depression   Hyperlipidemia    Other Visit Diagnoses    Need for immunization against influenza    -  Primary   Relevant Orders   Flu Vaccine QUAD 36+ mos IM (Completed)      Increase the Topamax to see if it helps little bit more.  Follow-up in a month. Follow up plan: Return in about 4 weeks (  around 04/12/2020), or if symptoms worsen or fail to improve, for Anxiety and diabetes recheck.  Counseling provided for all of the vaccine components Orders Placed This Encounter  Procedures  . Flu Vaccine QUAD 36+ mos IM    Caryl Pina, MD [facility] 03/15/2020, 2:25 PM

## 2020-03-16 ENCOUNTER — Telehealth: Payer: Self-pay

## 2020-03-16 NOTE — Telephone Encounter (Signed)
  Prescription Request  03/16/2020  What is the name of the medication or equipment? Primidone  Have you contacted your pharmacy to request a refill? (if applicable) Yes  Which pharmacy would you like this sent to? [facility]  [facility] from [facility] says that pt had visit with Dr [name] yesterday. Was supposed to refill his Primidone but did not send in refills for it. Says pt no longer sees Dr Tat who has previously prescribed this.    Patient notified that their request is being sent to the clinical staff for review and that they should receive a response within 2 business days.

## 2020-03-19 MED ORDER — PRIMIDONE 50 MG PO TABS
50.0000 mg | ORAL_TABLET | Freq: Every day | ORAL | 3 refills | Status: DC
Start: 2020-03-19 — End: 2021-01-25

## 2020-03-19 NOTE — Telephone Encounter (Signed)
I sent primidone for the patient

## 2020-03-19 NOTE — Telephone Encounter (Signed)
[place]

## 2020-03-19 NOTE — Progress Notes (Signed)
Cardiology Office Note:    Date:  03/20/2020   ID:  Douglas Carr, DOB 06-Apr-1966, MRN [id number]  PCP:  [name], [name], MD  [facility] Cardiologist:  Nanetta Batty, MD   [facility] Electrophysiologist:  Will Jorja Loa, MD   Referring MD: [name], [name], MD   Chief Complaint:  Follow-up (CAD, CHF)    Patient Profile:    Douglas Carr is a 54 y.o. male with:   Coronary artery disease ? S/p myocardial infarction at age 39, medical therapy ? S/p STEMI 1/18 >> PCI: DES to the OM2 ? S/p VT/VF cardiac arrest 1/18 (after DC) >> CPR (10 mins)/AED >> ROSC  Re-look cath w oLCx 70 and focal 90 prob prox edge dissection w thrombus >> PCI DES overlapping previous stent ? S/p NSTEMI >> s/p CABG in 9/21   Heart failure with reduced ejection fraction  ? Ischemic cardiomyopathy ? EF 30% ? Echo 12/19: EF 35-40, GR 1 DD ? Echo 9/21: EF 30 ? Intraoperative TEE 9/21: EF 40-45  S/p AICD  Hypertension  Diabetes mellitus ? Poorly controlled, Hgb A1c 14 (01/2020)  Hyperlipidemia  Carotid artery disease ? S/p R CEA  [place] in 2/18 w acute thrombus in R prox CCA (occluded CEA) >> occurred at time of VT/VF in 1/18 >> no intervention   Prior CV studies: Intraoperative TEE 01/27/2020 Post bypass EF improved at 40-45  Carotid US 01/26/2020 R ICA occluded; L ICA 40-59; R vertebral occluded, L vertebral retrograde flow  Cardiac catheterization 01/24/2020 LM patent LAD proximal-mid 60-70 (DFR 0.7) LCx ostial to mid diffuse 99 ISR RCA ostial 95 LVEDP 33; mean PA P 40; PCWP mean 30  Echocardiogram 01/23/2020 EF 30, GR 3 DD, normal RVSF, moderate LAE, small pericardial effusion, moderate left pleural effusion, mild MR  History of Present Illness:    Douglas Carr was admitted in Sept 2021 with a NSTEMI and acute congestive heart failure.  He was noted to have severe 3 v CAD and underwent CABG.  His post op course was notable for AKI (Creatinine 2 at DC).   He was last seen for post hospitalization follow up 02/22/20.  He was having issues with diarrhea at that point as well as some hypoglycemic episodes.  I cut back on his furosemide due to potential volume losses with diarrhea.  He returns for close follow up.  He is here with his wife.  His diarrhea has resolved.  His volume status has been stable.  He has not had leg swelling, orthopnea or significant weight gain.  He has not had significant chest pain.  He has not had syncope.  He has finished home physical therapy and is awaiting an appointment with cardiac rehab.      Past Medical History:  Diagnosis Date  . Acute ST elevation myocardial infarction (STEMI) involving left circumflex coronary artery (HCC) 05/07/2016   PCI to Cx-OM  . Anginal pain (HCC)    secondary to sm. vessel disease  . Anxiety   . Arthritis    BIL KNEE PAIN AND BIL ANKLE PAIN  . Bone spur of ankle   . Cardiac arrest (HCC) 05/24/2016   with v fib  . 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 Coronary artery disease involving native coronary artery of native heart with angina pectoris (HCC) 05/24/2016   Remote MI at 54 years of age, Last cath 2010-diffuse non-obstructive disease; last echo 06/16/08 -normal LV function,  moderate concentric hypertrophy; nuc 08/2008 no ischemia;  medical therapy; STEMI May 07 2016 - PCI to Cx-OM  . Diabetes mellitus    ON ORAL MEDICATION AND INSULIN  . Dilated cardiomyopathy (HCC) 05/24/2016   EF 325-30% by Echo post STEMI (previously 30-35%)   . Hyperlipidemia   . Hypertension   . Myocardial infarction (HCC) 1996   Post MI  . Peripheral vascular disease (HCC)    HAS LEFT CAROTID ARTERY STENOSIS   AND IS S/P RIGHT CAROTID ENDARTERECTOMY 2010 Last carotid dopplers 01/08/2012 wth patent endarterectomy site  . Status post coronary artery stent placement     Current Medications: No outpatient medications have been marked as taking for the 03/20/20  encounter (Office Visit) with [name] T, PA-C.     Allergies:   Patient has no known allergies.   Social History   Tobacco Use  . Smoking status: Former Smoker    Packs/day: 2.00    Years: 22.00    Pack years: 44.00    Types: Cigarettes    Quit date: 10/23/2011    Years since quitting: 8.4  . Smokeless tobacco: Current User    Types: Snuff  Vaping Use  . Vaping Use: Never used  Substance Use Topics  . Alcohol use: No  . Drug use: No     Family Hx: The patient's family history includes Diabetes in his mother; Heart attack in his paternal grandfather; Heart attack (age of onset: 63) in his father; Hypertension in his maternal grandfather and mother.  ROS   EKGs/Labs/Other Test Reviewed:    EKG:  EKG is   ordered today.  The ekg ordered today demonstrates sinus tachycardia, HR 104, normal axis, T wave inversions 1, 2, aVL aVF, V5-V6, QTC 465, similar to prior tracings  Recent Labs: 01/22/2020: ALT 38; B Natriuretic Peptide 788.0 01/28/2020: Magnesium 2.5 02/03/2020: Hemoglobin 8.6; Platelets 419 02/22/2020: BUN 22; Creatinine, Ser 1.63; Potassium 4.7; Sodium 142   Recent Lipid Panel Lab Results  Component Value Date/Time   CHOL 150 09/27/2019 02:59 PM   TRIG 261 (H) 09/27/2019 02:59 PM   HDL 34 (L) 09/27/2019 02:59 PM   CHOLHDL 4.4 09/27/2019 02:59 PM   CHOLHDL 4.7 05/24/2016 03:37 PM   LDLCALC 73 09/27/2019 02:59 PM      Risk Assessment/Calculations:      Physical Exam:    VS:  BP (!) 144/88   Pulse (!) 111   Ht 5\' 10"  (1.778 m)   Wt 187 lb (84.8 kg)   SpO2 99%   BMI 26.83 kg/m     Wt Readings from Last 3 Encounters:  03/20/20 187 lb (84.8 kg)  03/15/20 185 lb (83.9 kg)  02/22/20 187 lb 12.8 oz (85.2 kg)     Constitutional:      Appearance: Healthy appearance. Not in distress.  Neck:     Vascular: JVD normal.  Pulmonary:     Effort: Pulmonary effort is normal.     Breath sounds: No wheezing. No rales.  Cardiovascular:     Tachycardia  present. Regular rhythm. Normal S1. Normal S2.     Murmurs: There is no murmur.  Edema:    Peripheral edema absent.  Abdominal:     Palpations: Abdomen is soft.  Skin:    General: Skin is warm and dry.  Neurological:     General: No focal deficit present.     Mental Status: Alert and oriented to person, place and time.     Cranial Nerves: Cranial nerves  are intact.       ASSESSMENT & PLAN:    1. HFrEF (heart failure with reduced ejection fraction) (HCC) EF 30%.    Intraoperative TEE did demonstrate EF 40-45.  Ischemic cardiomyopathy.  NYHA II-IIb.  Volume status appears stable.  Continue current dose of furosemide.  His heart rate is elevated.  I will increase his carvedilol to 6.25 mg twice daily.  Of note, he used to be on carvedilol 25 mg twice a day.  I will obtain another BMET today.  If his creatinine continues to remain stable, we can consider starting him on Entresto at his next visit.  We discussed the importance of beta-blocker, angiotensin receptor neprilysin inhibitor, SGLT2i and spironolactone for CHF.  Therefore, after initiating Entresto, we can consider starting spironolactone and then dapagliflozin.  Follow-up in 3 weeks.  2. Coronary artery disease involving native coronary artery of native heart without angina pectoris Status post non-STEMI in September 2021 with subsequent CABG.  He continues to progress well.  As noted, his heart rate is elevated and I am adjusting his beta-blocker.  He will remain on aspirin and clopidogrel, atorvastatin.  He is finished with physical therapy at home now.  He is due to start cardiac rehabilitation.  3. Stage 3b chronic kidney disease (HCC) Repeat BMET today.  4. Essential hypertension Blood pressure somewhat elevated.  Adjust carvedilol as noted.  Consider Entresto at follow-up.  5. Mixed hyperlipidemia Continue current dose of atorvastatin.  Follow-up CMET, lipids pending in December.  6. ICD (implantable  cardioverter-defibrillator) in place I will arrange follow-up with Dr. Elberta Fortis.     Dispo:  Return in about 3 weeks (around 04/10/2020) for Routine Follow Up w/ Dr. Allyson Sabal, an APP on his team, or [name], PA-C.   Medication Adjustments/Labs and Tests Ordered: Current medicines are reviewed at length with the patient today.  Concerns regarding medicines are outlined above.  Tests Ordered: Orders Placed This Encounter  Procedures  . Basic metabolic panel  . Ambulatory referral to Cardiac Electrophysiology  . EKG 12-Lead   Medication Changes: Meds ordered this encounter  Medications  . carvedilol (COREG) 6.25 MG tablet    Sig: Take 1 tablet (6.25 mg total) by mouth 2 (two) times daily with a meal.    Dispense:  180 tablet    Refill:  3    Signed, [name], PA-C  03/20/2020 12:08 PM    [facility] [address] [place], [place], [place]  [address] Phone: [phone]; Fax: [phone]

## 2020-03-20 ENCOUNTER — Encounter: Payer: Self-pay | Admitting: Physician Assistant

## 2020-03-20 ENCOUNTER — Ambulatory Visit (INDEPENDENT_AMBULATORY_CARE_PROVIDER_SITE_OTHER): Payer: Medicare Other | Admitting: Physician Assistant

## 2020-03-20 ENCOUNTER — Other Ambulatory Visit: Payer: Self-pay

## 2020-03-20 VITALS — BP 144/88 | HR 111 | Ht 70.0 in | Wt 187.0 lb

## 2020-03-20 DIAGNOSIS — I10 Essential (primary) hypertension: Secondary | ICD-10-CM | POA: Diagnosis not present

## 2020-03-20 DIAGNOSIS — I2510 Atherosclerotic heart disease of native coronary artery without angina pectoris: Secondary | ICD-10-CM

## 2020-03-20 DIAGNOSIS — I5020 Unspecified systolic (congestive) heart failure: Secondary | ICD-10-CM

## 2020-03-20 DIAGNOSIS — E118 Type 2 diabetes mellitus with unspecified complications: Secondary | ICD-10-CM

## 2020-03-20 DIAGNOSIS — E782 Mixed hyperlipidemia: Secondary | ICD-10-CM

## 2020-03-20 DIAGNOSIS — I255 Ischemic cardiomyopathy: Secondary | ICD-10-CM

## 2020-03-20 DIAGNOSIS — Z95810 Presence of automatic (implantable) cardiac defibrillator: Secondary | ICD-10-CM

## 2020-03-20 DIAGNOSIS — N1832 Chronic kidney disease, stage 3b: Secondary | ICD-10-CM | POA: Diagnosis not present

## 2020-03-20 LAB — BASIC METABOLIC PANEL
BUN/Creatinine Ratio: 15 (ref 9–20)
BUN: 21 mg/dL (ref 6–24)
CO2: 24 mmol/L (ref 20–29)
Calcium: 9.5 mg/dL (ref 8.7–10.2)
Chloride: 102 mmol/L (ref 96–106)
Creatinine, Ser: 1.39 mg/dL — ABNORMAL HIGH (ref 0.76–1.27)
GFR calc Af Amer: 66 mL/min/{1.73_m2} (ref >59)
GFR calc non Af Amer: 57 mL/min/{1.73_m2} — ABNORMAL LOW (ref >59)
Glucose: 271 mg/dL — ABNORMAL HIGH (ref 65–99)
Potassium: 5 mmol/L (ref 3.5–5.2)
Sodium: 137 mmol/L (ref 134–144)

## 2020-03-20 MED ORDER — CARVEDILOL 6.25 MG PO TABS
6.2500 mg | ORAL_TABLET | Freq: Two times a day (BID) | ORAL | 3 refills | Status: DC
Start: 2020-03-20 — End: 2021-01-24

## 2020-03-20 NOTE — Patient Instructions (Signed)
Medication Instructions:  Your physician has recommended you make the following change in your medication:   1) Increase Coreg to 6.25 mg, 1 tablet by mouth twice a day  *If you need a refill on your cardiac medications before your next appointment, please call your pharmacy*  Lab Work: You will have labs drawn today: BMET  If you have labs (blood work) drawn today and your tests are completely normal, you will receive your results only by: Marland Kitchen MyChart Message (if you have MyChart) OR . A paper copy in the mail If you have any lab test that is abnormal or we need to change your treatment, we will call you to review the results.  Testing/Procedures: None ordered today  Follow-Up: At [facility], you and your health needs are our priority.  As part of our continuing mission to provide you with exceptional heart care, we have created designated Provider Care Teams.  These Care Teams include your primary Cardiologist (physician) and Advanced Practice Providers (APPs -  Physician Assistants and Nurse Practitioners) who all work together to provide you with the care you need, when you need it.  Your next appointment:   3 week(s)  The format for your next appointment:   In Person  Provider:   You may see Nanetta Batty, MD or one of the following Advanced Practice Providers on your designated Care Team:    Corine Shelter, PA-C  English, [place]  Edd Fabian, [place]

## 2020-03-23 ENCOUNTER — Other Ambulatory Visit: Payer: Self-pay | Admitting: Cardiovascular Disease

## 2020-03-26 ENCOUNTER — Telehealth (HOSPITAL_COMMUNITY): Payer: Self-pay

## 2020-03-26 NOTE — Telephone Encounter (Signed)
Called and spoke with pt wife Erskine Squibb, who stated pt is interested in CR. Patient will come in for orientation on 05/01/20 @ 10AM and will attend the 130PM exercise class.  Pensions consultant.

## 2020-03-30 ENCOUNTER — Other Ambulatory Visit: Payer: Self-pay | Admitting: Family Medicine

## 2020-03-30 DIAGNOSIS — E1169 Type 2 diabetes mellitus with other specified complication: Secondary | ICD-10-CM

## 2020-03-30 DIAGNOSIS — E118 Type 2 diabetes mellitus with unspecified complications: Secondary | ICD-10-CM

## 2020-03-31 NOTE — Progress Notes (Deleted)
Electrophysiology Office Note   Date:  03/31/2020   ID:  Douglas Carr, DOB May 15, 1965, MRN [id number]  PCP:  [name], Fransisca Kaufmann, MD  Cardiologist:  Gwenlyn Found Primary Electrophysiologist:  Constance Haw, MD    No chief complaint on file.    History of Present Illness: Douglas Carr is a 54 y.o. male who presents today for electrophysiology evaluation.     He presented to the hospital with STEMI January 2018 and had PCI to the OM 2.  He represented to the hospital after cardiac arrest due to ventricular fibrillation after being found pulseless by his family.  He did have AED shocks in the field.  He was taken back to the Cath Lab and was found to have an edge dissection with thrombus in the circumflex.  This was stented.  He was placed on cooling protocol.  It was the time that the rash is not caused by myocardial infarction and he is now status post Medtronic ICD.  He presented the hospital September 2021 with non-STEMI and acute heart failure.  He was found to have three-vessel disease and underwent CABG.  Today, denies symptoms of palpitations, chest pain, shortness of breath, orthopnea, PND, lower extremity edema, claudication, dizziness, presyncope, syncope, bleeding, or neurologic sequela. The patient is tolerating medications without difficulties. ***    Past Medical History:  Diagnosis Date  . Acute ST elevation myocardial infarction (STEMI) involving left circumflex coronary artery ([place]) 05/07/2016   PCI to Cx-OM  . Anginal pain ([place])    secondary to sm. vessel disease  . Anxiety   . Arthritis    BIL KNEE PAIN AND BIL ANKLE PAIN  . Bone spur of ankle   . Cardiac arrest ([place]) 05/24/2016   with v fib  . 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 Coronary artery disease involving native coronary artery of native heart with angina pectoris ([place]) 05/24/2016   Remote MI at 54 years of age, Last cath 2010-diffuse  non-obstructive disease; last echo 06/16/08 -normal LV function, moderate concentric hypertrophy; nuc 08/2008 no ischemia;  medical therapy; STEMI May 07 2016 - PCI to Cx-OM  . Diabetes mellitus    ON ORAL MEDICATION AND INSULIN  . Dilated cardiomyopathy ([place]) 05/24/2016   EF 325-30% by Echo post STEMI (previously 30-35%)   . Hyperlipidemia   . Hypertension   . Myocardial infarction ([place]) 1996   Post MI  . Peripheral vascular disease ([place])    HAS LEFT CAROTID ARTERY STENOSIS   AND IS S/P RIGHT CAROTID ENDARTERECTOMY 2010 Last carotid dopplers 01/08/2012 wth patent endarterectomy site  . Status post coronary artery stent placement    Past Surgical History:  Procedure Laterality Date  . APPENDECTOMY    . CARDIAC CATHETERIZATION     FEB 2010, significant branch vessel disease wth diag, marginal, PDA & PLA, nml. LV function  . CARDIAC CATHETERIZATION N/A 05/07/2016   Procedure: Left Heart Cath and Coronary Angiography;  Surgeon: Peter M Martinique, MD;  Location: [facility];  Service: Cardiovascular;  Laterality: N/A;  . CARDIAC CATHETERIZATION N/A 05/07/2016   Procedure: Coronary Stent Intervention;  Surgeon: Peter M Martinique, MD;  Location: [facility];  Service: Cardiovascular;  Laterality: N/A;  . CARDIAC CATHETERIZATION N/A 05/24/2016   Procedure: Left Heart Cath and Coronary Angiography;  Surgeon: [name], MD;  Location: [facility];  Service: Cardiovascular;  Laterality: N/A;  . CARDIAC CATHETERIZATION  N/A 05/24/2016   Procedure: Coronary Stent Intervention;  Surgeon: [name], MD;  Location: [facility];  Service: Cardiovascular;  Laterality: N/A;  2.5x20 Promus to Ostial/proximal circumflex  . CAROTID ENDARTERECTOMY  09/2008   Rt CEA  . CLIPPING OF ATRIAL APPENDAGE Left 01/27/2020   Procedure: CLIPPING OF ATRIAL APPENDAGE USING ATRICURE 36 MM ATRICLIP;  Surgeon: Wonda Olds, MD;  Location: [facility];  Service: Open Heart Surgery;  Laterality: Left;  .  CORONARY ARTERY BYPASS GRAFT N/A 01/27/2020   Procedure: CORONARY ARTERY BYPASS GRAFTING (CABG), ON PUMP, TIMES FIVE, USING BILATERAL MAMMARY ARTERIES, LEFT RADIAL ARTERY, AND ENDOSCOPICALLY HARVESTED RIGHT GREATER SAPHENOUS VEIN.;  Surgeon: Wonda Olds, MD;  Location: [facility];  Service: Open Heart Surgery;  Laterality: N/A;  LIMA->LAD RIMA->D1 (free graft) LRA-> OM SVG-> PDA->PL  . ENDOVEIN HARVEST OF GREATER SAPHENOUS VEIN Right 01/27/2020   Procedure: ENDOVEIN HARVEST OF GREATER SAPHENOUS VEIN;  Surgeon: Wonda Olds, MD;  Location: [facility];  Service: Open Heart Surgery;  Laterality: Right;  . ICD IMPLANT N/A 06/30/2016   Procedure: ICD Implant;  Surgeon: Trestan Vahle Meredith Leeds, MD;  Location: [facility];  Service: Cardiovascular;  Laterality: N/A;  . INTRAVASCULAR PRESSURE WIRE/FFR STUDY N/A 01/24/2020   Procedure: INTRAVASCULAR PRESSURE WIRE/FFR STUDY;  Surgeon: Belva Crome, MD;  Location: [facility];  Service: Cardiovascular;  Laterality: N/A;  . LUNG BIOPSY  2013   Bx's suggest granulomatous dz.  Marland Kitchen RADIAL ARTERY HARVEST Left 01/27/2020   Procedure: RADIAL ARTERY HARVEST;  Surgeon: Wonda Olds, MD;  Location: [facility];  Service: Open Heart Surgery;  Laterality: Left;  . RIGHT/LEFT HEART CATH AND CORONARY ANGIOGRAPHY N/A 01/24/2020   Procedure: RIGHT/LEFT HEART CATH AND CORONARY ANGIOGRAPHY;  Surgeon: Belva Crome, MD;  Location: [facility];  Service: Cardiovascular;  Laterality: N/A;  . RT ANKLE   2013  . TEE WITHOUT CARDIOVERSION N/A 01/27/2020   Procedure: TRANSESOPHAGEAL ECHOCARDIOGRAM (TEE);  Surgeon: Wonda Olds, MD;  Location: [facility];  Service: Open Heart Surgery;  Laterality: N/A;     Current Outpatient Medications  Medication Sig Dispense Refill  . aspirin (ASPIRIN LOW DOSE) 81 MG EC tablet Take 1 tablet (81 mg total) by mouth daily. 90 tablet 0  . atorvastatin (LIPITOR) 80 MG tablet Take 1 tablet (80 mg total) by mouth daily. 90 tablet 0  .  blood glucose meter kit and supplies KIT Inject 1 each into the skin 4 (four) times daily as needed. Dispense based on patient and insurance preference. Use up to four times daily as directed. (FOR ICD-9 250.00, 250.01). 1 each 0  . carvedilol (COREG) 3.125 MG tablet TAKE ONE TABLET BY MOUTH TWICE DAILY WITH A MEAL **PT needs to keep upcoming appt in Oct for further refills** 60 tablet 0  . carvedilol (COREG) 6.25 MG tablet Take 1 tablet (6.25 mg total) by mouth 2 (two) times daily with a meal. 180 tablet 3  . clopidogrel (PLAVIX) 75 MG tablet TAKE 1 TABLET BY MOUTH EVERY DAY IN THE EVENING 90 tablet 0  . DULoxetine (CYMBALTA) 60 MG capsule Take 2 capsules (120 mg total) by mouth daily. 180 capsule 3  . furosemide (LASIX) 40 MG tablet Take 1 tablet (40 mg total) by mouth daily. 90 tablet 3  . glucose blood (ONETOUCH VERIO) test strip 1 each by Other route in the morning, at noon, in the evening, and at bedtime. use for testing 120 each 11  .  hydrOXYzine (ATARAX/VISTARIL) 25 MG tablet Take 1 tablet (25 mg total) by mouth 3 (three) times daily as needed. 270 tablet 0  . insulin degludec (TRESIBA FLEXTOUCH) 200 UNIT/ML FlexTouch Pen Inject 116 Units into the skin daily. (Patient taking differently: Inject 160 Units into the skin daily. ) 26 mL 5  . Insulin Pen Needle (B-D ULTRAFINE III SHORT PEN) 31G X 8 MM MISC USE TO INJECT INSULIN DAILY DX E11.9 100 each 3  . isosorbide dinitrate (ISORDIL) 10 MG tablet TAKE ONE TABLET BY MOUTH TWICE DAILY **PT needs to keep upcoming appt in October for further refills** 60 tablet 0  . mirtazapine (REMERON) 7.5 MG tablet TAKE 1 TABLET AT BEDTIME FOR DEPRESSION 90 tablet 0  . OneTouch Delica Lancets 50Y MISC Test blood sugars three times daily 100 each 11  . primidone (MYSOLINE) 50 MG tablet Take 1 tablet (50 mg total) by mouth at bedtime. 90 tablet 3  . Semaglutide,0.25 or 0.5MG/DOS, (OZEMPIC, 0.25 OR 0.5 MG/DOSE,) 2 MG/1.5ML SOPN Inject 0.5 mg into the skin once a  week. 3 mL 0  . tamsulosin (FLOMAX) 0.4 MG CAPS capsule TAKE 1 CAPSULE DAILY IN THE EVENING 90 capsule 3  . topiramate (TOPAMAX) 50 MG tablet Take 1 tablet (50 mg total) by mouth at bedtime as needed. 90 tablet 3  . traMADol (ULTRAM) 50 MG tablet Take 1 tablet (50 mg total) by mouth every 6 (six) hours as needed for moderate pain. 50 tablet 0   No current facility-administered medications for this visit.    Allergies:   Patient has no known allergies.   Social History:  The patient  reports that he quit smoking about 8 years ago. His smoking use included cigarettes. He has a 44.00 pack-year smoking history. His smokeless tobacco use includes snuff. He reports that he does not drink alcohol and does not use drugs.   Family History:  The patient's family history includes Diabetes in his mother; Heart attack in his paternal grandfather; Heart attack (age of onset: 52) in his father; Hypertension in his maternal grandfather and mother.   ROS:  Please see the history of present illness.   Otherwise, review of systems is positive for none.   All other systems are reviewed and negative.   PHYSICAL EXAM: VS:  There were no vitals taken for this visit. , BMI There is no height or weight on file to calculate BMI. GEN: Well nourished, well developed, in no acute distress  HEENT: normal  Neck: no JVD, carotid bruits, or masses Cardiac: ***RRR; no murmurs, rubs, or gallops,no edema  Respiratory:  clear to auscultation bilaterally, normal work of breathing GI: soft, nontender, nondistended, + BS MS: no deformity or atrophy  Skin: warm and dry, device site well healed Neuro:  Strength and sensation are intact Psych: euthymic mood, full affect  EKG:  EKG {ACTION; IS/IS DXA:[id number]} ordered today. Personal review of the ekg ordered *** shows ***  Personal review of the device interrogation today. Results in [facility]: 01/22/2020: ALT 38; B Natriuretic Peptide 788.0 01/28/2020: Magnesium  2.5 02/03/2020: Hemoglobin 8.6; Platelets 419 03/20/2020: BUN 21; Creatinine, Ser 1.39; Potassium 5.0; Sodium 137    Lipid Panel     Component Value Date/Time   CHOL 150 09/27/2019 1459   TRIG 261 (H) 09/27/2019 1459   HDL 34 (L) 09/27/2019 1459   CHOLHDL 4.4 09/27/2019 1459   CHOLHDL 4.7 05/24/2016 1537   VLDL 28 05/24/2016 1537   LDLCALC 73 09/27/2019 1459  Wt Readings from Last 3 Encounters:  03/20/20 187 lb (84.8 kg)  03/15/20 185 lb (83.9 kg)  02/22/20 187 lb 12.8 oz (85.2 kg)      Other studies Reviewed: Additional studies/ records that were reviewed today include: Cath 05/24/16, TTE 05/15/16  Review of the above records today demonstrates:   Ost Cx to Mid Cx lesion, 70 %stenosed leading into OM2 as the main trunk of the Circumflex.  Ost 2nd Mrg to 2nd Mrg recent Promus DES 2.5 x 24 stent, - focal 90 %stenosed with what appears to be proximal edge dissection with thrombus  A STENT PROMUS PREM MR 2.5X20 drug eluting stent was successfully placed from Ostium of Circumflex into OM2, and overlaps previously placed stent.  Post intervention, there is a 0% residual stenosis.  ____________________________________________________  Colon Flattery LAD to Prox LAD lesion, 30 %stenosed.  Ost 1st Diag to 1st Diag lesion, 70 %stenosed.  Ost 1st Mrg to 1st Mrg lesion, 35 %stenosed. Ost 2nd Diag to 2nd Diag lesion, 50 %stenosed.  Prox RCA to Mid RCA lesion, 65 %stenosed - lesion appears similar to prior cath, with mild progression.  Very distal RPDA lesion, 100 %stenosed.  LV end diastolic pressure is severely elevated.  There is no aortic valve stenosis.    - Left ventricle: The cavity size was moderately dilated. Wall   thickness was normal. Systolic function was severely reduced. The   estimated ejection fraction was in the range of 25% to 30%.   Akinesis and scarring of the anteroseptal and anterior   myocardium; consistent with infarction in the distribution of the   left  anterior descending coronary artery. Dyskinesis of the   apicalanterior myocardium. Features are consistent with a   pseudonormal left ventricular filling pattern, with concomitant   abnormal relaxation and increased filling pressure (grade 2   diastolic dysfunction). No evidence of thrombus. - Left atrium: The atrium was mildly dilated. - Atrial septum: No defect or patent foramen ovale was identified.  ASSESSMENT AND PLAN:  1.  Ischemic cardiomyopathy: Currently on optimal medical therapy with Coreg, lisinopril, aspirin, Plavix.  Is status post Medtronic ICD for VT VF arrest. ***  2.  Coronary artery disease status post CABG:***  3.  VT/VF arrest: Status post Medtronic ICD implanted 06/30/2016.  Device functioning appropriately.  No changes.  4.  Hyperlipidemia: Continue Lipitor    Current medicines are reviewed at length with the patient today.   The patient does not have concerns regarding his medicines.  The following changes were made today:  ***  Labs/ tests ordered today include:  No orders of the defined types were placed in this encounter.    Disposition:   FU with Efrat Zuidema *** months  Signed, Lenville Hibberd Meredith Leeds, MD  03/31/2020 12:47 PM     [address] [place] [address] [phone] (office) [phone] (fax)

## 2020-04-02 ENCOUNTER — Encounter: Payer: Medicare Other | Admitting: Cardiology

## 2020-04-10 ENCOUNTER — Telehealth: Payer: Self-pay

## 2020-04-10 ENCOUNTER — Ambulatory Visit (INDEPENDENT_AMBULATORY_CARE_PROVIDER_SITE_OTHER): Payer: Medicare Other

## 2020-04-10 DIAGNOSIS — I255 Ischemic cardiomyopathy: Secondary | ICD-10-CM | POA: Diagnosis not present

## 2020-04-10 LAB — CUP PACEART REMOTE DEVICE CHECK
Battery Remaining Longevity: 106 mo
Battery Voltage: 3.01 V
Brady Statistic RV Percent Paced: 0 %
Date Time Interrogation Session: 20211207012506
HighPow Impedance: 59 Ohm
Implantable Lead Implant Date: 20180226
Implantable Lead Location: 753860
Implantable Pulse Generator Implant Date: 20180226
Lead Channel Impedance Value: 285 Ohm
Lead Channel Impedance Value: 361 Ohm
Lead Channel Pacing Threshold Amplitude: 0.875 V
Lead Channel Pacing Threshold Pulse Width: 0.4 ms
Lead Channel Sensing Intrinsic Amplitude: 11.25 mV
Lead Channel Sensing Intrinsic Amplitude: 11.25 mV
Lead Channel Setting Pacing Amplitude: 2.5 V
Lead Channel Setting Pacing Pulse Width: 0.4 ms
Lead Channel Setting Sensing Sensitivity: 0.3 mV

## 2020-04-10 MED ORDER — ENTRESTO 24-26 MG PO TABS: 1.0000 | ORAL_TABLET | Freq: Two times a day (BID) | ORAL | 6 refills | Status: DC

## 2020-04-10 NOTE — Telephone Encounter (Signed)
Dr. Elberta Fortis made aware of plan (result note). Remote scheduled for 04/16/20. Will forward to [facility], PA-C.

## 2020-04-10 NOTE — Telephone Encounter (Signed)
Optivol indicates volume overload. Entresto should help with this. Please have him increase Lasix to 40 mg twice daily x 3 days, then resume 40 mg once daily. Check optivol again in 1 week (I have already notified Lanora Manis in device clinic to recheck in 1 week). [name], PA-C    04/10/2020 1:58 PM

## 2020-04-10 NOTE — Telephone Encounter (Signed)
Carelink alert received 04/10/20 for Optivol peaked end of September. Patient had CABG during beginning of Optivol. Patient is followed by [name], PA-C. Will forward to [place].

## 2020-04-10 NOTE — Telephone Encounter (Signed)
I called and spoke with patients wife Erskine Squibb, she is aware to have patient increase Furosemide to 40 mg, twice a day for 3 days, then resume 40 mg once a day.

## 2020-04-10 NOTE — Telephone Encounter (Signed)
The patients wife Erskine Squibb has been notified of the result and verbalized understanding. Ok to speak with Erskine Squibb per patient DPR. All questions (if any) were answered. Entresto prescription sent to patients pharmacy. Per [name], PA-C since patient is having a CMET with lipids on 04/18/20, BMET does not need to be drawn. Patients wife aware to keep lab appointment next week.  Lajoyce Corners, CMA 04/10/2020 12:53 PM

## 2020-04-10 NOTE — Telephone Encounter (Signed)
-----   Message from Beatrice Lecher, [place] sent at 03/20/2020  6:10 PM EST ----- Creatinine improved.  K+ normal.  Glucose high. Since his renal function is improved, I think he will be able to tolerate Entresto. Since I just increased his Carvedilol, we will hold off for 1 week before we start the Entresto. PLAN:  -On Mon 03/26/20, start Entresto 24/26 mg twice daily (give new start Rx card, etc) -If BP starts to run low (systolic BP < 100) after starting Entresto, decrease Furosemide 20 mg once daily  -Obtain BMET 1 week after starting Douglas Levering, PA-C    03/20/2020 6:02 PM

## 2020-04-12 ENCOUNTER — Other Ambulatory Visit: Payer: Self-pay

## 2020-04-12 ENCOUNTER — Encounter: Payer: Self-pay | Admitting: Family Medicine

## 2020-04-12 ENCOUNTER — Telehealth: Payer: Self-pay

## 2020-04-12 ENCOUNTER — Ambulatory Visit (INDEPENDENT_AMBULATORY_CARE_PROVIDER_SITE_OTHER): Payer: Medicare Other | Admitting: Family Medicine

## 2020-04-12 VITALS — BP 98/67 | HR 93 | Ht 70.0 in | Wt 191.0 lb

## 2020-04-12 DIAGNOSIS — Z23 Encounter for immunization: Secondary | ICD-10-CM

## 2020-04-12 DIAGNOSIS — E118 Type 2 diabetes mellitus with unspecified complications: Secondary | ICD-10-CM | POA: Diagnosis not present

## 2020-04-12 DIAGNOSIS — E669 Obesity, unspecified: Secondary | ICD-10-CM

## 2020-04-12 DIAGNOSIS — E1169 Type 2 diabetes mellitus with other specified complication: Secondary | ICD-10-CM

## 2020-04-12 DIAGNOSIS — I10 Essential (primary) hypertension: Secondary | ICD-10-CM

## 2020-04-12 DIAGNOSIS — E782 Mixed hyperlipidemia: Secondary | ICD-10-CM

## 2020-04-12 DIAGNOSIS — F32A Depression, unspecified: Secondary | ICD-10-CM

## 2020-04-12 DIAGNOSIS — F419 Anxiety disorder, unspecified: Secondary | ICD-10-CM

## 2020-04-12 DIAGNOSIS — I255 Ischemic cardiomyopathy: Secondary | ICD-10-CM | POA: Diagnosis not present

## 2020-04-12 LAB — BAYER DCA HB A1C WAIVED: HB A1C (BAYER DCA - WAIVED): 7.9 % — ABNORMAL HIGH (ref <7.0)

## 2020-04-12 MED ORDER — OZEMPIC (1 MG/DOSE) 2 MG/1.5ML ~~LOC~~ SOPN: 1.0000 mg | PEN_INJECTOR | SUBCUTANEOUS | 3 refills | Status: DC

## 2020-04-12 NOTE — Addendum Note (Signed)
Addended by: Dorene Sorrow on: 04/12/2020 04:15 PM   Modules accepted: Orders

## 2020-04-12 NOTE — Telephone Encounter (Signed)
PA for Entresto 24-26mg  completed on CoverMyMeds. Waiting for response...  KEY: [id number]

## 2020-04-12 NOTE — Progress Notes (Signed)
BP 98/67   Pulse 93   Ht 5' 10"  (1.778 m)   Wt 191 lb (86.6 kg)   SpO2 100%   BMI 27.41 kg/m    Subjective:   Patient ID: Douglas Carr, male    DOB: 1965-10-22, 54 y.o.   MRN: [id number]  HPI: Douglas Carr is a 55 y.o. male presenting on 04/12/2020 for Medical Management of Chronic Issues (CAD, DM,HTN, hyperlipid)   HPI Type 2 diabetes mellitus Patient comes in today for recheck of his diabetes. Patient has been currently taking Ozempic 0.5 mg weekly, says is not taking his Tresiba at all anymore, A1c 7.9. Patient is currently on an ACE inhibitor/ARB. Patient has seen an ophthalmologist this year. Patient denies any issues with their feet. The symptom started onset as an adult hypertension and hyperlipidemia ARE RELATED TO DM   Hyperlipidemia Patient is coming in for recheck of his hyperlipidemia. The patient is currently taking atorvastatin. They deny any issues with myalgias or history of liver damage from it. They deny any focal numbness or weakness or chest pain.   Hypertension Patient is currently on carvedilol and furosemide and isosorbide dinitrate and Entresto, and their blood pressure today is 98/67. Patient denies any lightheadedness or dizziness. Patient denies headaches, blurred vision, chest pains, shortness of breath, or weakness. Denies any side effects from medication and is content with current medication.   Anxiety depression Patient is currently taking duloxetine for anxiety depression feels like it is helping a lot, he has been more focused on taking care of his health and take care of himself and that does show.  Relevant past medical, surgical, family and social history reviewed and updated as indicated. Interim medical history since our last visit reviewed. Allergies and medications reviewed and updated.  Review of Systems  Constitutional: Negative for chills and fever.  Eyes: Negative for visual disturbance.  Respiratory: Negative for  shortness of breath and wheezing.   Cardiovascular: Negative for chest pain and leg swelling.  Musculoskeletal: Negative for back pain and gait problem.  Skin: Negative for rash.  Neurological: Negative for dizziness, weakness and light-headedness.  All other systems reviewed and are negative.   Per HPI unless specifically indicated above   Allergies as of 04/12/2020   No Known Allergies     Medication List       Accurate as of April 12, 2020 11:33 AM. If you have any questions, ask your nurse or doctor.        aspirin 81 MG EC tablet Commonly known as: Aspirin Low Dose Take 1 tablet (81 mg total) by mouth daily.   atorvastatin 80 MG tablet Commonly known as: LIPITOR Take 1 tablet (80 mg total) by mouth daily.   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 Dispense based on patient and insurance preference. Use up to four times daily as directed. (FOR ICD-9 250.00, 250.01).   carvedilol 6.25 MG tablet Commonly known as: COREG Take 1 tablet (6.25 mg total) by mouth 2 (two) times daily with a meal.   carvedilol 3.125 MG tablet Commonly known as: COREG TAKE ONE TABLET BY MOUTH TWICE DAILY WITH A MEAL **PT needs to keep upcoming appt in Oct for further refills**   clopidogrel 75 MG tablet Commonly known as: PLAVIX TAKE 1  TABLET BY MOUTH EVERY DAY IN THE EVENING   DULoxetine 60 MG capsule Commonly known as: CYMBALTA Take 2 capsules (120 mg total) by mouth daily.   Entresto 24-26 MG Generic drug: sacubitril-valsartan Take 1 tablet by mouth 2 (two) times daily.   furosemide 40 MG tablet Commonly known as: LASIX Take 1 tablet (40 mg total) by mouth daily.   hydrOXYzine 25 MG tablet Commonly known as: ATARAX/VISTARIL Take 1 tablet (25 mg total) by mouth 3 (three) times daily as needed.    isosorbide dinitrate 10 MG tablet Commonly known as: ISORDIL TAKE ONE TABLET BY MOUTH TWICE DAILY **PT needs to keep upcoming appt in October for further refills**   mirtazapine 7.5 MG tablet Commonly known as: REMERON TAKE 1 TABLET AT BEDTIME FOR DEPRESSION   OneTouch Delica Lancets 13Y Misc Test blood sugars three times daily   OneTouch Verio test strip Generic drug: glucose blood 1 each by Other route in the morning, at noon, in the evening, and at bedtime. use for testing   Ozempic (0.25 or 0.5 MG/DOSE) 2 MG/1.5ML Sopn Generic drug: Semaglutide(0.25 or 0.5MG/DOS) INJECT 0.5MG UNDER THE SKIN (SUBCUTANEOUSLY) ONCE A WEEK   primidone 50 MG tablet Commonly known as: MYSOLINE Take 1 tablet (50 mg total) by mouth at bedtime.   tamsulosin 0.4 MG Caps capsule Commonly known as: FLOMAX TAKE 1 CAPSULE DAILY IN THE EVENING   topiramate 50 MG tablet Commonly known as: TOPAMAX Take 1 tablet (50 mg total) by mouth at bedtime as needed.   traMADol 50 MG tablet Commonly known as: Ultram Take 1 tablet (50 mg total) by mouth every 6 (six) hours as needed for moderate pain.   Tyler Aas FlexTouch 200 UNIT/ML FlexTouch Pen Generic drug: insulin degludec Inject 116 Units into the skin daily. What changed: how much to take        Objective:   Ht 5' 10"  (1.778 m)   BMI 26.83 kg/m   Wt Readings from Last 3 Encounters:  03/20/20 187 lb (84.8 kg)  03/15/20 185 lb (83.9 kg)  02/22/20 187 lb 12.8 oz (85.2 kg)    Physical Exam Vitals and nursing note reviewed.  Constitutional:      General: He is not in acute distress.    Appearance: He is well-developed and well-nourished. He is not diaphoretic.  Eyes:     General: No scleral icterus.    Extraocular Movements: EOM normal.     Conjunctiva/sclera: Conjunctivae normal.  Neck:     Thyroid: No thyromegaly.  Cardiovascular:     Rate and Rhythm: Normal rate and regular rhythm.     Pulses: Intact distal pulses.     Heart sounds:  Normal heart sounds. No murmur heard.   Pulmonary:     Effort: Pulmonary effort is normal. No respiratory distress.     Breath sounds: Normal breath sounds. No wheezing.  Musculoskeletal:        General: No edema. Normal range of motion.     Cervical back: Neck supple.  Lymphadenopathy:     Cervical: No cervical adenopathy.  Skin:    General: Skin is warm and dry.     Findings: No rash.  Neurological:     Mental Status: He is alert and oriented to person, place, and time.     Coordination: Coordination normal.  Psychiatric:        Mood and Affect: Mood and affect normal.        Behavior: Behavior normal.  Assessment & Plan:   Problem List Items Addressed This Visit      Cardiovascular and Mediastinum   Essential hypertension (Chronic)     Endocrine   Diabetes mellitus type 2 in obese (HCC)   Relevant Medications   Semaglutide, 1 MG/DOSE, (OZEMPIC, 1 MG/DOSE,) 2 MG/1.5ML SOPN   Diabetes mellitus with complication ([place]) - Primary   Relevant Medications   Semaglutide, 1 MG/DOSE, (OZEMPIC, 1 MG/DOSE,) 2 MG/1.5ML SOPN   Other Relevant Orders   Bayer DCA Hb A1c Waived     Other   Anxiety and depression   Hyperlipidemia      Increase Ozempic to 1 mg weekly, continue other medication, no changes.  A1c is 7.9 which is improved.  Doing better with duloxetine with motivation and anxiety and depression. Follow up plan: Return in about 3 months (around 07/11/2020), or if symptoms worsen or fail to improve, for Diabetes recheck.  Counseling provided for all of the vaccine components No orders of the defined types were placed in this encounter.   Caryl Pina, MD [facility] 04/12/2020, 11:33 AM

## 2020-04-13 ENCOUNTER — Ambulatory Visit: Payer: Medicare Other | Admitting: Cardiology

## 2020-04-13 DIAGNOSIS — E119 Type 2 diabetes mellitus without complications: Secondary | ICD-10-CM | POA: Insufficient documentation

## 2020-04-13 DIAGNOSIS — Z95810 Presence of automatic (implantable) cardiac defibrillator: Secondary | ICD-10-CM | POA: Insufficient documentation

## 2020-04-13 DIAGNOSIS — I739 Peripheral vascular disease, unspecified: Secondary | ICD-10-CM | POA: Insufficient documentation

## 2020-04-13 NOTE — Telephone Encounter (Signed)
PA for Sherryll Burger is approved for 04/12/2020 - 04/12/2021.

## 2020-04-18 ENCOUNTER — Other Ambulatory Visit: Payer: Medicare Other

## 2020-04-18 ENCOUNTER — Telehealth: Payer: Self-pay | Admitting: Physician Assistant

## 2020-04-18 ENCOUNTER — Telehealth: Payer: Self-pay

## 2020-04-18 NOTE — Telephone Encounter (Signed)
Erskine Squibb called a notified that lab requests have been faxed to [facility].   Verbalized understanding.

## 2020-04-18 NOTE — Telephone Encounter (Signed)
I spoke with the patient wife and she is going to call me back when she get home.

## 2020-04-18 NOTE — Telephone Encounter (Signed)
Fax number [phone] at [facility].  Fax sent successfully

## 2020-04-18 NOTE — Telephone Encounter (Signed)
   Pt's wife and the pt requesting if they can get pt's lab work done at [facility] in [place] so it is closer to them.

## 2020-04-19 ENCOUNTER — Telehealth (HOSPITAL_COMMUNITY): Payer: Self-pay | Admitting: Student-PharmD

## 2020-04-19 NOTE — Telephone Encounter (Signed)
Called patient to request manual transmission. Need to recheck Optivol and send to [facility].  No answer, LMOMV.

## 2020-04-20 NOTE — Progress Notes (Signed)
Remote ICD transmission.   

## 2020-04-23 NOTE — Telephone Encounter (Signed)
Calling patient to send manual transmission to assess Optivol.  No answer, LMOVM.

## 2020-04-25 ENCOUNTER — Telehealth (HOSPITAL_COMMUNITY): Payer: Self-pay | Admitting: *Deleted

## 2020-04-25 NOTE — Telephone Encounter (Signed)
Cardiac Rehab Medication Review by a Pharmacist  Does the patient  feel that his/her medications are working for him/her?  yes  Has the patient been experiencing any side effects to the medications prescribed?  no  Does the patient measure his/her own blood pressure or blood glucose at home?  yes   Does the patient have any problems obtaining medications due to transportation or finances?   no  Understanding of regimen: good Understanding of indications: good Potential of compliance: good  Pharmacist Intervention: none needed at this time   Douglas Carr, PharmD PGY1 Pharmacy Resident 04/25/2020 10:37 AM

## 2020-04-25 NOTE — Telephone Encounter (Signed)
3rd attempt to call. No answer, LMOVM.  Certified letter sent.

## 2020-04-30 ENCOUNTER — Other Ambulatory Visit: Payer: Self-pay | Admitting: Physician Assistant

## 2020-04-30 ENCOUNTER — Telehealth (HOSPITAL_COMMUNITY): Payer: Self-pay

## 2020-04-30 ENCOUNTER — Other Ambulatory Visit: Payer: Self-pay | Admitting: Family Medicine

## 2020-04-30 DIAGNOSIS — F32A Depression, unspecified: Secondary | ICD-10-CM

## 2020-04-30 DIAGNOSIS — F419 Anxiety disorder, unspecified: Secondary | ICD-10-CM

## 2020-04-30 NOTE — Telephone Encounter (Signed)
Cardiac Rehab Note:  Unsuccessful telephone encounter to [facility]. [place] to confirm CR orientation appointment for Tuesday 05/01/20 at 10:00 am. Hip[employer] compliant VM message left requesting call back to [phone].  Khushi Zupko E. Suzie Portela RN, BSN [place]. [facility]  Cardiac and Pulmonary Rehabilitation Phone: [phone] Fax: [phone]

## 2020-05-01 ENCOUNTER — Other Ambulatory Visit: Payer: BC Managed Care – PPO

## 2020-05-01 ENCOUNTER — Encounter (HOSPITAL_COMMUNITY)
Admission: RE | Admit: 2020-05-01 | Discharge: 2020-05-01 | Disposition: A | Discharge status: Home / Self Care | Payer: Medicare Other | Source: Ambulatory Visit | Attending: Cardiovascular Disease | Admitting: Cardiovascular Disease

## 2020-05-01 ENCOUNTER — Other Ambulatory Visit: Payer: Self-pay

## 2020-05-01 DIAGNOSIS — Z951 Presence of aortocoronary bypass graft: Secondary | ICD-10-CM | POA: Insufficient documentation

## 2020-05-01 DIAGNOSIS — I214 Non-ST elevation (NSTEMI) myocardial infarction: Secondary | ICD-10-CM | POA: Insufficient documentation

## 2020-05-01 LAB — GLUCOSE, CAPILLARY: Glucose-Capillary: 526 mg/dL (ref 70–99)

## 2020-05-01 NOTE — Progress Notes (Signed)
Incomplete Cardiac Rehab Orientation Note  Patient Details  Name: Douglas Carr MRN: [id number] Date of Birth: [birth year] Referring Provider:    [name] did not complete his rehab orientation today and will be rescheduled for a later date. Douglas Carr presented to his cardiac rehab orientation appointment as scheduled. Unfortunately his health history was not reviewed prior to appointment as 3 unsuccessful telephone encounters were made and call back as requested was not completed. Douglas Carr denied all complaints today. Vitals as follows: manual BP 98/62, Sats 98% RA, wt 87.1 kg. Patient denied checking fasting CBG prior to orientation appointment. CBG 526, taken by CR RN. Patient admits to eating a chocolate cream doughnut and hot chocolate for breakfast. He denies blurred vision, thirst, or other hyperglycemic symptoms. Drove himself to appointment. Reports missing last weeks dose of ozempic, which he resumed this morning. Not taking Evaristo Bury Flextouch 116 units daily as prescribed (states Dr. Louanne Skye is aware).   Cardiac Rehab RN requested to contact Dr. Louanne Skye for additional orders with CBG at critical level. Patient refused and instructed RN "do not call him". CR RN requested to call patient's wife to see if she was able to pick up patient from CR. Patient refused and instructed RN "do not call her". States he will go home and take [place] 116 units. He is also encouraged to hydrate and recheck his CBG often. Patient is educated on s/s of hyperglycemia and risk of sustaining a critical level CBG. Patient states "I have lived in the 73s and that can be normal for me". He was not interested in additional education and states he has been to multiple DM education sessions. "I know what to do I just don't do it". States he is not going to change his diet. Douglas Lineman, RN was present as patient refused additional interventions including refusal to let RN contact medical  provider. Patient is provided diabetic guidelines to participation in CR and instructed that he must have his CBG < 300 inorder to participate. Patient left in stable condition. Encouraged to contact department once he arrived home safely. Patient provided department telephone number and agreed to call.   Douglas Rhodes E. Suzie Portela RN, BSN [place]. [facility]  Cardiac and Pulmonary Rehabilitation Phone: [phone] Fax: [phone]

## 2020-05-03 ENCOUNTER — Telehealth: Payer: Self-pay

## 2020-05-03 ENCOUNTER — Other Ambulatory Visit: Payer: Self-pay | Admitting: Family Medicine

## 2020-05-03 DIAGNOSIS — E782 Mixed hyperlipidemia: Secondary | ICD-10-CM

## 2020-05-03 NOTE — Telephone Encounter (Signed)
The patient wife tried to help the patient send a transmission but received the error code [id number] and [id number]. I gave her the number to [facility] tech support to get additional help. I told her if the nurse has additional questions she will give them a call back.

## 2020-05-03 NOTE — Telephone Encounter (Signed)
This should go to EP. [name], PA-C    05/03/2020 3:38 PM

## 2020-05-07 ENCOUNTER — Ambulatory Visit (HOSPITAL_COMMUNITY): Payer: BC Managed Care – PPO

## 2020-05-09 ENCOUNTER — Ambulatory Visit (HOSPITAL_COMMUNITY): Payer: Medicare Other

## 2020-05-09 ENCOUNTER — Telehealth (HOSPITAL_COMMUNITY): Payer: Self-pay

## 2020-05-09 NOTE — Telephone Encounter (Signed)
Unable to leave VM on pt mobile, tried calling pt to see if he was interested in rescheduling for cardiac rehab.

## 2020-05-10 ENCOUNTER — Other Ambulatory Visit: Payer: Self-pay

## 2020-05-10 ENCOUNTER — Encounter: Payer: Medicare Other | Admitting: Cardiology

## 2020-05-10 NOTE — Progress Notes (Deleted)
Electrophysiology Office Note   Date:  05/10/2020   ID:  Douglas Carr, DOB 1966-02-08, MRN [id number]  PCP:  [name], Fransisca Kaufmann, MD  Cardiologist:  Gwenlyn Found Primary Electrophysiologist:  Constance Haw, MD    No chief complaint on file.    History of Present Illness: Douglas Carr is a 55 y.o. male who presents today for electrophysiology evaluation.     He was admitted January 2018 with STEMI with PCI to the OM 2.  He then represented to the hospital with cardiac arrest due to ventricular fibrillation after being found pulseless by his family.  CPR was initiated within 1 minute.  He had an AED shock in the field.  He was taken back to the Cath Lab which showed an edge dissection with thrombus in the circumflex treated with overlapping drug-eluting stents.  He was put on cooling protocol.  He is now status post Medtronic ICD implanted 06/30/2016.  His hospital course was complicated by anoxic encephalopathy.  Today, denies symptoms of palpitations, chest pain, shortness of breath, orthopnea, PND, lower extremity edema, claudication, dizziness, presyncope, syncope, bleeding, or neurologic sequela. The patient is tolerating medications without difficulties and is otherwise without complaint today. He had a Medtronic ICD implanted for secondary prevention on 06/30/16. He is been able to work with cardiac rehabilitation, working at 15-20 minutes time. He still has some memory issues but that is significantly improved.   Past Medical History:  Diagnosis Date  . Acute ST elevation myocardial infarction (STEMI) involving left circumflex coronary artery ([place]) 05/07/2016   PCI to Cx-OM  . Anginal pain ([place])    secondary to sm. vessel disease  . Anxiety   . Arthritis    BIL KNEE PAIN AND BIL ANKLE PAIN  . Bone spur of ankle   . Cardiac arrest ([place]) 05/24/2016   with v fib  . 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 Coronary  artery disease involving native coronary artery of native heart with angina pectoris ([place]) 05/24/2016   Remote MI at 55 years of age, Last cath 2010-diffuse non-obstructive disease; last echo 06/16/08 -normal LV function, moderate concentric hypertrophy; nuc 08/2008 no ischemia;  medical therapy; STEMI May 07 2016 - PCI to Cx-OM  . Diabetes mellitus    ON ORAL MEDICATION AND INSULIN  . Dilated cardiomyopathy ([place]) 05/24/2016   EF 325-30% by Echo post STEMI (previously 30-35%)   . Hyperlipidemia   . Hypertension   . Myocardial infarction ([place]) 1996   Post MI  . Peripheral vascular disease ([place])    HAS LEFT CAROTID ARTERY STENOSIS   AND IS S/P RIGHT CAROTID ENDARTERECTOMY 2010 Last carotid dopplers 01/08/2012 wth patent endarterectomy site  . Status post coronary artery stent placement    Past Surgical History:  Procedure Laterality Date  . APPENDECTOMY    . CARDIAC CATHETERIZATION     FEB 2010, significant branch vessel disease wth diag, marginal, PDA & PLA, nml. LV function  . CARDIAC CATHETERIZATION N/A 05/07/2016   Procedure: Left Heart Cath and Coronary Angiography;  Surgeon: Peter M Martinique, MD;  Location: [place] CV [facility];  Service: Cardiovascular;  Laterality: N/A;  . CARDIAC CATHETERIZATION N/A 05/07/2016   Procedure: Coronary Stent Intervention;  Surgeon: Peter M Martinique, MD;  Location: [place] CV [facility];  Service: Cardiovascular;  Laterality: N/A;  . CARDIAC CATHETERIZATION N/A 05/24/2016   Procedure: Left Heart Cath and Coronary Angiography;  Surgeon: [name], MD;  Location: [place] CV LAB;  Service: Cardiovascular;  Laterality: N/A;  . CARDIAC CATHETERIZATION N/A 05/24/2016   Procedure: Coronary Stent Intervention;  Surgeon: [name], MD;  Location: [place] CV LAB;  Service: Cardiovascular;  Laterality: N/A;  2.5x20 Promus to Ostial/proximal circumflex  . CAROTID ENDARTERECTOMY  09/2008   Rt CEA  . CLIPPING OF ATRIAL APPENDAGE Left 01/27/2020   Procedure: CLIPPING  OF ATRIAL APPENDAGE USING ATRICURE 37 MM ATRICLIP;  Surgeon: Wonda Olds, MD;  Location: [place];  Service: Open Heart Surgery;  Laterality: Left;  . CORONARY ARTERY BYPASS GRAFT N/A 01/27/2020   Procedure: CORONARY ARTERY BYPASS GRAFTING (CABG), ON PUMP, TIMES FIVE, USING BILATERAL MAMMARY ARTERIES, LEFT RADIAL ARTERY, AND ENDOSCOPICALLY HARVESTED RIGHT GREATER SAPHENOUS VEIN.;  Surgeon: Wonda Olds, MD;  Location: [place];  Service: Open Heart Surgery;  Laterality: N/A;  LIMA->LAD RIMA->D1 (free graft) LRA-> OM SVG-> PDA->PL  . ENDOVEIN HARVEST OF GREATER SAPHENOUS VEIN Right 01/27/2020   Procedure: ENDOVEIN HARVEST OF GREATER SAPHENOUS VEIN;  Surgeon: Wonda Olds, MD;  Location: [place];  Service: Open Heart Surgery;  Laterality: Right;  . ICD IMPLANT N/A 06/30/2016   Procedure: ICD Implant;  Surgeon: Judson Tsan Meredith Leeds, MD;  Location: [place] CV LAB;  Service: Cardiovascular;  Laterality: N/A;  . INTRAVASCULAR PRESSURE WIRE/FFR STUDY N/A 01/24/2020   Procedure: INTRAVASCULAR PRESSURE WIRE/FFR STUDY;  Surgeon: Belva Crome, MD;  Location: [place] [facility];  Service: Cardiovascular;  Laterality: N/A;  . LUNG BIOPSY  2013   Bx's suggest granulomatous dz.  Marland Kitchen RADIAL ARTERY HARVEST Left 01/27/2020   Procedure: RADIAL ARTERY HARVEST;  Surgeon: Wonda Olds, MD;  Location: [place];  Service: Open Heart Surgery;  Laterality: Left;  . RIGHT/LEFT HEART CATH AND CORONARY ANGIOGRAPHY N/A 01/24/2020   Procedure: RIGHT/LEFT HEART CATH AND CORONARY ANGIOGRAPHY;  Surgeon: Belva Crome, MD;  Location: [place] CV LAB;  Service: Cardiovascular;  Laterality: N/A;  . RT ANKLE   2013  . TEE WITHOUT CARDIOVERSION N/A 01/27/2020   Procedure: TRANSESOPHAGEAL ECHOCARDIOGRAM (TEE);  Surgeon: Wonda Olds, MD;  Location: [place];  Service: Open Heart Surgery;  Laterality: N/A;     Current Outpatient Medications  Medication Sig Dispense Refill  . ASPIRIN LOW DOSE 81 MG EC tablet TAKE ONE  TABLET BY MOUTH DAILY 30 tablet 1  . atorvastatin (LIPITOR) 80 MG tablet TAKE 1 TABLET BY MOUTH EVERY DAY 90 tablet 1  . blood glucose meter kit and supplies KIT Inject 1 each into the skin 4 (four) times daily as needed. Dispense based on patient and insurance preference. Use up to four times daily as directed. (FOR ICD-9 250.00, 250.01). 1 each 0  . carvedilol (COREG) 6.25 MG tablet Take 1 tablet (6.25 mg total) by mouth 2 (two) times daily with a meal. 180 tablet 3  . clopidogrel (PLAVIX) 75 MG tablet TAKE ONE TABLET BY MOUTH IN THE EVENING 30 tablet 1  . DULoxetine (CYMBALTA) 60 MG capsule Take 2 capsules (120 mg total) by mouth daily. 180 capsule 3  . furosemide (LASIX) 40 MG tablet Take 1 tablet (40 mg total) by mouth daily. 90 tablet 3  . glucose blood (ONETOUCH VERIO) test strip 1 each by Other route in the morning, at noon, in the evening, and at bedtime. use for testing 120 each 11  . hydrOXYzine (ATARAX/VISTARIL) 25 MG tablet TAKE ONE TABLET BY MOUTH THREE TIMES DAILY AS NEEDED 90 tablet 1  . Insulin Pen  Needle (B-D ULTRAFINE III SHORT PEN) 31G X 8 MM MISC USE TO INJECT INSULIN DAILY DX E11.9 100 each 3  . isosorbide dinitrate (ISORDIL) 10 MG tablet Take 1 tablet (10 mg total) by mouth 2 (two) times daily. 60 tablet 10  . mirtazapine (REMERON) 7.5 MG tablet TAKE ONE TABLET BY MOUTH AT BEDTIME FOR DEPRESSION 30 tablet 1  . OneTouch Delica Lancets 30Z MISC Test blood sugars three times daily 100 each 11  . primidone (MYSOLINE) 50 MG tablet Take 1 tablet (50 mg total) by mouth at bedtime. 90 tablet 3  . sacubitril-valsartan (ENTRESTO) 24-26 MG Take 1 tablet by mouth 2 (two) times daily. 60 tablet 6  . Semaglutide, 1 MG/DOSE, (OZEMPIC, 1 MG/DOSE,) 2 MG/1.5ML SOPN Inject 1 mg into the skin once a week. 3 mL 3  . tamsulosin (FLOMAX) 0.4 MG CAPS capsule TAKE 1 CAPSULE DAILY IN THE EVENING (Patient taking differently: Take 0.4 mg by mouth at bedtime. TAKE 1 CAPSULE DAILY IN THE EVENING) 90 capsule  3  . topiramate (TOPAMAX) 50 MG tablet Take 1 tablet (50 mg total) by mouth at bedtime as needed. (Patient taking differently: Take 50 mg by mouth at bedtime as needed (anxiety).) 90 tablet 3  . traMADol (ULTRAM) 50 MG tablet Take 1 tablet (50 mg total) by mouth every 6 (six) hours as needed for moderate pain. 50 tablet 0   No current facility-administered medications for this visit.    Allergies:   Patient has no known allergies.   Social History:  The patient  reports that he quit smoking about 8 years ago. His smoking use included cigarettes. He has a 44.00 pack-year smoking history. His smokeless tobacco use includes snuff. He reports that he does not drink alcohol and does not use drugs.   Family History:  The patient's family history includes Diabetes in his mother; Heart attack in his paternal grandfather; Heart attack (age of onset: 59) in his father; Hypertension in his maternal grandfather and mother.   ROS:  Please see the history of present illness.   Otherwise, review of systems is positive for none.   All other systems are reviewed and negative.   PHYSICAL EXAM: VS:  There were no vitals taken for this visit. , BMI There is no height or weight on file to calculate BMI. GEN: Well nourished, well developed, in no acute distress  HEENT: normal  Neck: no JVD, carotid bruits, or masses Cardiac: ***RRR; no murmurs, rubs, or gallops,no edema  Respiratory:  clear to auscultation bilaterally, normal work of breathing GI: soft, nontender, nondistended, + BS MS: no deformity or atrophy  Skin: warm and dry, device site well healed Neuro:  Strength and sensation are intact Psych: euthymic mood, full affect  EKG:  EKG {ACTION; IS/IS [id number]} ordered today. Personal review of the ekg ordered *** shows ***  Personal review of the device interrogation today. Results in [place]: 01/22/2020: ALT 38; B Natriuretic Peptide 788.0 01/28/2020: Magnesium 2.5 02/03/2020:  Hemoglobin 8.6; Platelets 419 03/20/2020: BUN 21; Creatinine, Ser 1.39; Potassium 5.0; Sodium 137    Lipid Panel     Component Value Date/Time   CHOL 150 09/27/2019 1459   TRIG 261 (H) 09/27/2019 1459   HDL 34 (L) 09/27/2019 1459   CHOLHDL 4.4 09/27/2019 1459   CHOLHDL 4.7 05/24/2016 1537   VLDL 28 05/24/2016 1537   LDLCALC 73 09/27/2019 1459     Wt Readings from Last 3 Encounters:  04/12/20 191 lb (86.6  kg)  03/20/20 187 lb (84.8 kg)  03/15/20 185 lb (83.9 kg)      Other studies Reviewed: Additional studies/ records that were reviewed today include: Cath 05/24/16, TTE 05/15/16  Review of the above records today demonstrates:   Ost Cx to Mid Cx lesion, 70 %stenosed leading into OM2 as the main trunk of the Circumflex.  Ost 2nd Mrg to 2nd Mrg recent Promus DES 2.5 x 24 stent, - focal 90 %stenosed with what appears to be proximal edge dissection with thrombus  A STENT PROMUS PREM MR 2.5X20 drug eluting stent was successfully placed from [place] of Circumflex into OM2, and overlaps previously placed stent.  Post intervention, there is a 0% residual stenosis.  ____________________________________________________  Colon Flattery LAD to Prox LAD lesion, 30 %stenosed.  Ost 1st Diag to 1st Diag lesion, 70 %stenosed.  Ost 1st Mrg to 1st Mrg lesion, 35 %stenosed. Ost 2nd Diag to 2nd Diag lesion, 50 %stenosed.  Prox RCA to Mid RCA lesion, 65 %stenosed - lesion appears similar to prior cath, with mild progression.  Very distal RPDA lesion, 100 %stenosed.  LV end diastolic pressure is severely elevated.  There is no aortic valve stenosis.    - Left ventricle: The cavity size was moderately dilated. Wall   thickness was normal. Systolic function was severely reduced. The   estimated ejection fraction was in the range of 25% to 30%.   Akinesis and scarring of the anteroseptal and anterior   myocardium; consistent with infarction in the distribution of the   left anterior descending  coronary artery. Dyskinesis of the   apicalanterior myocardium. Features are consistent with a   pseudonormal left ventricular filling pattern, with concomitant   abnormal relaxation and increased filling pressure (grade 2   diastolic dysfunction). No evidence of thrombus. - Left atrium: The atrium was mildly dilated. - Atrial septum: No defect or patent foramen ovale was identified.  ASSESSMENT AND PLAN:  1.  Ischemic cardiomyopathy: Currently on optimal medical therapy with Coreg, lisinopril, aspirin, Plavix.  Status post Medtronic ICD.  No obvious volume overload.  2.  VT/VF arrest: Status post Medtronic ICD implanted 06/30/2016.  Device functioning appropriately.  No changes at this time.  3.  Hyperlipidemia: Continue statin per primary cardiology  4.  Hypertension:***  Current medicines are reviewed at length with the patient today.   The patient does not have concerns regarding his medicines.  The following changes were made today:  ***  Labs/ tests ordered today include:  No orders of the defined types were placed in this encounter.    Disposition:   FU with Donell Sliwinski *** months  Signed, Maleaha Hughett Meredith Leeds, MD  05/10/2020 9:25 AM     [facility] [address] [place] [address] [phone] (office) [phone] (fax)

## 2020-05-11 ENCOUNTER — Ambulatory Visit (HOSPITAL_COMMUNITY): Payer: Medicare Other

## 2020-05-14 ENCOUNTER — Ambulatory Visit (HOSPITAL_COMMUNITY): Payer: Medicare Other

## 2020-05-16 ENCOUNTER — Ambulatory Visit (HOSPITAL_COMMUNITY): Payer: Medicare Other

## 2020-05-18 ENCOUNTER — Ambulatory Visit (HOSPITAL_COMMUNITY): Payer: Medicare Other

## 2020-05-21 ENCOUNTER — Ambulatory Visit (HOSPITAL_COMMUNITY): Payer: Medicare Other

## 2020-05-23 ENCOUNTER — Ambulatory Visit (HOSPITAL_COMMUNITY): Payer: Medicare Other

## 2020-05-25 ENCOUNTER — Ambulatory Visit (HOSPITAL_COMMUNITY): Payer: Medicare Other

## 2020-05-28 ENCOUNTER — Ambulatory Visit (HOSPITAL_COMMUNITY): Payer: Medicare Other

## 2020-05-30 ENCOUNTER — Ambulatory Visit (HOSPITAL_COMMUNITY): Payer: Medicare Other

## 2020-05-31 ENCOUNTER — Other Ambulatory Visit: Payer: Self-pay | Admitting: *Deleted

## 2020-06-01 ENCOUNTER — Ambulatory Visit (HOSPITAL_COMMUNITY): Payer: Medicare Other

## 2020-06-04 ENCOUNTER — Ambulatory Visit (HOSPITAL_COMMUNITY): Payer: Medicare Other

## 2020-06-06 ENCOUNTER — Ambulatory Visit (HOSPITAL_COMMUNITY): Payer: Medicare Other

## 2020-06-08 ENCOUNTER — Ambulatory Visit (HOSPITAL_COMMUNITY): Payer: Medicare Other

## 2020-06-11 ENCOUNTER — Ambulatory Visit (HOSPITAL_COMMUNITY): Payer: Medicare Other

## 2020-06-12 ENCOUNTER — Encounter (HOSPITAL_COMMUNITY): Payer: Self-pay

## 2020-06-12 ENCOUNTER — Telehealth (HOSPITAL_COMMUNITY): Payer: Self-pay

## 2020-06-12 NOTE — Telephone Encounter (Signed)
Attempted to call patient in regards to Cardiac Rehab - LM on VM Mailed letter 

## 2020-06-13 ENCOUNTER — Ambulatory Visit (HOSPITAL_COMMUNITY): Payer: Medicare Other

## 2020-06-15 ENCOUNTER — Encounter: Payer: Medicare Other | Admitting: Cardiology

## 2020-06-15 ENCOUNTER — Ambulatory Visit (HOSPITAL_COMMUNITY): Payer: Medicare Other

## 2020-06-18 ENCOUNTER — Ambulatory Visit (HOSPITAL_COMMUNITY): Payer: Medicare Other

## 2020-06-20 ENCOUNTER — Ambulatory Visit (HOSPITAL_COMMUNITY): Payer: Medicare Other

## 2020-06-21 ENCOUNTER — Other Ambulatory Visit: Payer: Self-pay | Admitting: Family Medicine

## 2020-06-21 DIAGNOSIS — E782 Mixed hyperlipidemia: Secondary | ICD-10-CM

## 2020-06-22 ENCOUNTER — Ambulatory Visit (HOSPITAL_COMMUNITY): Payer: Medicare Other

## 2020-06-25 ENCOUNTER — Ambulatory Visit (HOSPITAL_COMMUNITY): Payer: Medicare Other

## 2020-06-27 ENCOUNTER — Ambulatory Visit (HOSPITAL_COMMUNITY): Payer: Medicare Other

## 2020-06-28 ENCOUNTER — Other Ambulatory Visit: Payer: Self-pay | Admitting: Family Medicine

## 2020-06-29 ENCOUNTER — Ambulatory Visit (HOSPITAL_COMMUNITY): Payer: Medicare Other

## 2020-07-04 ENCOUNTER — Telehealth (HOSPITAL_COMMUNITY): Payer: Self-pay

## 2020-07-04 NOTE — Telephone Encounter (Signed)
No response from pt.  Closed referral  

## 2020-07-10 ENCOUNTER — Ambulatory Visit (INDEPENDENT_AMBULATORY_CARE_PROVIDER_SITE_OTHER): Payer: Medicare Other

## 2020-07-10 DIAGNOSIS — I255 Ischemic cardiomyopathy: Secondary | ICD-10-CM

## 2020-07-10 LAB — CUP PACEART REMOTE DEVICE CHECK
Battery Remaining Longevity: 103 mo
Battery Voltage: 3.01 V
Brady Statistic RV Percent Paced: 0 %
Date Time Interrogation Session: 20220308012403
HighPow Impedance: 64 Ohm
Implantable Lead Implant Date: 20180226
Implantable Lead Location: 753860
Implantable Pulse Generator Implant Date: 20180226
Lead Channel Impedance Value: 285 Ohm
Lead Channel Impedance Value: 399 Ohm
Lead Channel Pacing Threshold Amplitude: 0.875 V
Lead Channel Pacing Threshold Pulse Width: 0.4 ms
Lead Channel Sensing Intrinsic Amplitude: 15.25 mV
Lead Channel Sensing Intrinsic Amplitude: 15.25 mV
Lead Channel Setting Pacing Amplitude: 2.5 V
Lead Channel Setting Pacing Pulse Width: 0.4 ms
Lead Channel Setting Sensing Sensitivity: 0.3 mV

## 2020-07-12 ENCOUNTER — Encounter: Payer: Self-pay | Admitting: Family Medicine

## 2020-07-12 ENCOUNTER — Ambulatory Visit: Payer: Medicare Other | Admitting: Family Medicine

## 2020-07-19 NOTE — Progress Notes (Signed)
Remote ICD transmission.   

## 2020-07-20 ENCOUNTER — Ambulatory Visit (HOSPITAL_COMMUNITY)
Admission: RE | Admit: 2020-07-20 | Payer: Medicare Other | Source: Ambulatory Visit | Attending: Cardiovascular Disease | Admitting: Cardiovascular Disease

## 2020-07-20 ENCOUNTER — Encounter (HOSPITAL_COMMUNITY): Payer: Self-pay

## 2020-07-24 ENCOUNTER — Telehealth: Payer: Self-pay

## 2020-07-24 ENCOUNTER — Other Ambulatory Visit: Payer: Self-pay | Admitting: Family Medicine

## 2020-07-24 DIAGNOSIS — E1169 Type 2 diabetes mellitus with other specified complication: Secondary | ICD-10-CM

## 2020-07-24 DIAGNOSIS — E118 Type 2 diabetes mellitus with unspecified complications: Secondary | ICD-10-CM

## 2020-07-24 DIAGNOSIS — F419 Anxiety disorder, unspecified: Secondary | ICD-10-CM

## 2020-07-24 DIAGNOSIS — F32A Depression, unspecified: Secondary | ICD-10-CM

## 2020-07-24 NOTE — Telephone Encounter (Signed)
Patient referred to [facility] by Dr Elberta Fortis due to abnormal Optiovl on most recent Care[employer] remote transmission.    Received message from Dr Elberta Fortis nurse Dory Horn, RN to contact wife for ICM enrollment.  Attempted ICM Referral Call and left message to return call with ICM direct number.

## 2020-08-06 ENCOUNTER — Encounter: Payer: Self-pay | Admitting: Family Medicine

## 2020-08-06 ENCOUNTER — Ambulatory Visit (INDEPENDENT_AMBULATORY_CARE_PROVIDER_SITE_OTHER): Payer: Medicare Other | Admitting: Family Medicine

## 2020-08-06 ENCOUNTER — Other Ambulatory Visit: Payer: Self-pay

## 2020-08-06 VITALS — BP 121/68 | HR 99 | Ht 70.0 in | Wt 196.0 lb

## 2020-08-06 DIAGNOSIS — E1169 Type 2 diabetes mellitus with other specified complication: Secondary | ICD-10-CM | POA: Diagnosis not present

## 2020-08-06 DIAGNOSIS — E785 Hyperlipidemia, unspecified: Secondary | ICD-10-CM

## 2020-08-06 DIAGNOSIS — F32A Depression, unspecified: Secondary | ICD-10-CM | POA: Diagnosis not present

## 2020-08-06 DIAGNOSIS — F419 Anxiety disorder, unspecified: Secondary | ICD-10-CM | POA: Diagnosis not present

## 2020-08-06 DIAGNOSIS — E669 Obesity, unspecified: Secondary | ICD-10-CM

## 2020-08-06 DIAGNOSIS — I10 Essential (primary) hypertension: Secondary | ICD-10-CM

## 2020-08-06 DIAGNOSIS — E118 Type 2 diabetes mellitus with unspecified complications: Secondary | ICD-10-CM | POA: Diagnosis not present

## 2020-08-06 DIAGNOSIS — I255 Ischemic cardiomyopathy: Secondary | ICD-10-CM | POA: Diagnosis not present

## 2020-08-06 DIAGNOSIS — E782 Mixed hyperlipidemia: Secondary | ICD-10-CM

## 2020-08-06 LAB — BAYER DCA HB A1C WAIVED: HB A1C (BAYER DCA - WAIVED): 10.1 % — ABNORMAL HIGH (ref <7.0)

## 2020-08-06 MED ORDER — CLOPIDOGREL BISULFATE 75 MG PO TABS
ORAL_TABLET | ORAL | 3 refills | Status: DC
Start: 2020-08-06 — End: 2021-07-12

## 2020-08-06 MED ORDER — OZEMPIC (1 MG/DOSE) 2 MG/1.5ML ~~LOC~~ SOPN: 1.0000 mg | PEN_INJECTOR | SUBCUTANEOUS | 3 refills | Status: DC

## 2020-08-06 MED ORDER — MIRTAZAPINE 7.5 MG PO TABS: 7.5000 mg | ORAL_TABLET | Freq: Every day | ORAL | 3 refills | Status: DC

## 2020-08-06 MED ORDER — TAMSULOSIN HCL 0.4 MG PO CAPS
ORAL_CAPSULE | ORAL | 3 refills | Status: DC
Start: 2020-08-06 — End: 2021-05-29

## 2020-08-06 MED ORDER — ATORVASTATIN CALCIUM 80 MG PO TABS: 1.0000 | ORAL_TABLET | Freq: Every evening | ORAL | 3 refills | Status: DC

## 2020-08-06 MED ORDER — HYDROXYZINE HCL 25 MG PO TABS: 25.0000 mg | ORAL_TABLET | Freq: Three times a day (TID) | ORAL | 3 refills | Status: DC | PRN

## 2020-08-06 NOTE — Addendum Note (Signed)
Addended by: Arville Care on: 08/06/2020 03:43 PM   Modules accepted: Orders

## 2020-08-06 NOTE — Progress Notes (Signed)
BP 121/68   Pulse 99   Ht 5' 10"  (1.778 m)   Wt 196 lb (88.9 kg)   SpO2 100%   BMI 28.12 kg/m    Subjective:   Patient ID: Douglas Carr, male    DOB: [birth year], 55 y.o.   MRN: [id number]  HPI: Douglas Carr is a 55 y.o. male presenting on 08/06/2020 for Medical Management of Chronic Issues and Diabetes   HPI Type 2 diabetes mellitus Patient comes in today for recheck of his diabetes. Patient has been currently taking Ozempic. Patient is currently on an ACE inhibitor/ARB. Patient has not seen an ophthalmologist this year. Patient denies any issues with their feet. The symptom started onset as an adult hyperlipidemia and CAD ARE RELATED TO DM   Hyperlipidemia Patient is coming in for recheck of his hyperlipidemia. The patient is currently taking atorvastatin. They deny any issues with myalgias or history of liver damage from it. They deny any focal numbness or weakness or chest pain.   Hypertension Patient is currently on isosorbide dinitrate and Entresto and carvedilol and Lasix, and their blood pressure today is 121/68. Patient denies any lightheadedness or dizziness. Patient denies headaches, blurred vision, chest pains, shortness of breath, or weakness. Denies any side effects from medication and is content with current medication.   Anxiety and depression Patient is coming in for anxiety depression recheck currently taking Cymbalta.  Feels lipids are going well denies any suicidal ideations or thoughts of hurting self.  Relevant past medical, surgical, family and social history reviewed and updated as indicated. Interim medical history since our last visit reviewed. Allergies and medications reviewed and updated.  Review of Systems  Constitutional: Negative for chills and fever.  Respiratory: Negative for shortness of breath and wheezing.   Cardiovascular: Negative for chest pain and leg swelling.  Musculoskeletal: Negative for back pain and gait problem.  Skin:  Negative for rash.  Psychiatric/Behavioral: Negative for dysphoric mood, sleep disturbance and suicidal ideas. The patient is not nervous/anxious.   All other systems reviewed and are negative.   Per HPI unless specifically indicated above   Allergies as of 08/06/2020   No Known Allergies     Medication List       Accurate as of August 06, 2020  3:41 PM. If you have any questions, ask your nurse or doctor.        Aspirin Low Dose 81 MG EC tablet Generic drug: aspirin TAKE ONE TABLET BY MOUTH DAILY   atorvastatin 80 MG tablet Commonly known as: LIPITOR Take 1 tablet (80 mg total) by mouth every evening.   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 Dispense based on patient and insurance preference. Use up to four times daily as directed. (FOR ICD-9 250.00, 250.01).   carvedilol 6.25 MG tablet Commonly known as: COREG Take 1 tablet (6.25 mg total) by mouth 2 (two) times daily with a meal.   clopidogrel 75 MG tablet Commonly known as: PLAVIX TAKE ONE TABLET BY MOUTH IN THE EVENING What changed: additional instructions Changed by: Fransisca Kaufmann Maridee Slape, MD   DULoxetine 60 MG capsule Commonly known as: CYMBALTA Take 2 capsules (120 mg total) by mouth daily.   Entresto 24-26 MG Generic drug: sacubitril-valsartan Take 1 tablet by mouth 2 (two) times daily.  furosemide 40 MG tablet Commonly known as: LASIX Take 1 tablet (40 mg total) by mouth daily.   hydrOXYzine 25 MG tablet Commonly known as: ATARAX/VISTARIL Take 1 tablet (25 mg total) by mouth 3 (three) times daily as needed.   isosorbide dinitrate 10 MG tablet Commonly known as: ISORDIL Take 1 tablet (10 mg total) by mouth 2 (two) times daily.   mirtazapine 7.5 MG tablet Commonly known as: REMERON Take 1 tablet (7.5 mg total) by mouth at  bedtime. TAKE ONE TABLET BY MOUTH AT BEDTIME FOR DEPRESSION What changed:   how much to take  how to take this  when to take this  additional instructions Changed by: Fransisca Kaufmann Lavontay Kirk, MD   OneTouch Delica Lancets 99I Misc Test blood sugars three times daily   OneTouch Verio test strip Generic drug: glucose blood 1 each by Other route in the morning, at noon, in the evening, and at bedtime. use for testing   Ozempic (1 MG/DOSE) 2 MG/1.5ML Sopn Generic drug: Semaglutide (1 MG/DOSE) Inject 1 mg into the skin once a week.   primidone 50 MG tablet Commonly known as: MYSOLINE Take 1 tablet (50 mg total) by mouth at bedtime.   tamsulosin 0.4 MG Caps capsule Commonly known as: FLOMAX TAKE 1 CAPSULE DAILY IN THE EVENING What changed:   how much to take  how to take this  when to take this   topiramate 50 MG tablet Commonly known as: TOPAMAX Take 1 tablet (50 mg total) by mouth at bedtime as needed. What changed: reasons to take this   traMADol 50 MG tablet Commonly known as: Ultram Take 1 tablet (50 mg total) by mouth every 6 (six) hours as needed for moderate pain.        Objective:   BP 121/68   Pulse 99   Ht 5' 10"  (1.778 m)   Wt 196 lb (88.9 kg)   SpO2 100%   BMI 28.12 kg/m   Wt Readings from Last 3 Encounters:  08/06/20 196 lb (88.9 kg)  04/12/20 191 lb (86.6 kg)  03/20/20 187 lb (84.8 kg)    Physical Exam Vitals and nursing note reviewed.  Constitutional:      General: He is not in acute distress.    Appearance: He is well-developed. He is not diaphoretic.  Eyes:     General: No scleral icterus.    Conjunctiva/sclera: Conjunctivae normal.  Neck:     Thyroid: No thyromegaly.  Cardiovascular:     Rate and Rhythm: Normal rate and regular rhythm.     Heart sounds: Normal heart sounds. No murmur heard.   Pulmonary:     Effort: Pulmonary effort is normal. No respiratory distress.     Breath sounds: Normal breath sounds. No wheezing.   Musculoskeletal:     Cervical back: Neck supple.  Lymphadenopathy:     Cervical: No cervical adenopathy.  Neurological:     Mental Status: He is alert and oriented to person, place, and time.     Coordination: Coordination normal.  Psychiatric:        Behavior: Behavior normal.       Assessment & Plan:   Problem List Items Addressed This Visit      Cardiovascular and Mediastinum   Essential hypertension - Primary (Chronic)   Relevant Medications   atorvastatin (LIPITOR) 80 MG tablet     Endocrine   Diabetes mellitus type 2 in obese (HCC)   Relevant Medications   atorvastatin (LIPITOR) 80 MG tablet  Semaglutide, 1 MG/DOSE, (OZEMPIC, 1 MG/DOSE,) 2 MG/1.5ML SOPN   Diabetes mellitus with complication (HCC)   Relevant Medications   atorvastatin (LIPITOR) 80 MG tablet   Semaglutide, 1 MG/DOSE, (OZEMPIC, 1 MG/DOSE,) 2 MG/1.5ML SOPN     Other   Anxiety and depression   Relevant Medications   hydrOXYzine (ATARAX/VISTARIL) 25 MG tablet   mirtazapine (REMERON) 7.5 MG tablet   Dyslipidemia, goal LDL below 70   Relevant Medications   atorvastatin (LIPITOR) 80 MG tablet    Other Visit Diagnoses    Mixed hyperlipidemia       Relevant Medications   atorvastatin (LIPITOR) 80 MG tablet      Patient has DMV paperwork instructed him that he needs to go see his neurologist and his neurologist Follow up plan: Return in about 3 months (around 11/05/2020), or if symptoms worsen or fail to improve, for Diabetes and hypertension recheck.  Counseling provided for all of the vaccine components No orders of the defined types were placed in this encounter.   Caryl Pina, MD [facility] 08/06/2020, 3:41 PM

## 2020-08-07 ENCOUNTER — Other Ambulatory Visit: Payer: Self-pay | Admitting: Family

## 2020-08-07 ENCOUNTER — Telehealth: Payer: Self-pay | Admitting: Family Medicine

## 2020-08-07 ENCOUNTER — Other Ambulatory Visit: Payer: Self-pay | Admitting: Family Medicine

## 2020-08-07 DIAGNOSIS — E875 Hyperkalemia: Secondary | ICD-10-CM

## 2020-08-07 LAB — CMP14+EGFR
ALT: 28 IU/L (ref 0–44)
AST: 16 IU/L (ref 0–40)
Albumin/Globulin Ratio: 1.6 (ref 1.2–2.2)
Albumin: 4.5 g/dL (ref 3.8–4.9)
Alkaline Phosphatase: 115 IU/L (ref 44–121)
BUN/Creatinine Ratio: 20 (ref 9–20)
BUN: 29 mg/dL — ABNORMAL HIGH (ref 6–24)
Bilirubin Total: 0.2 mg/dL (ref 0.0–1.2)
CO2: 23 mmol/L (ref 20–29)
Calcium: 9.5 mg/dL (ref 8.7–10.2)
Chloride: 101 mmol/L (ref 96–106)
Creatinine, Ser: 1.42 mg/dL — ABNORMAL HIGH (ref 0.76–1.27)
Globulin, Total: 2.8 g/dL (ref 1.5–4.5)
Glucose: 411 mg/dL (ref 65–99)
Potassium: 6.1 mmol/L (ref 3.5–5.2)
Sodium: 139 mmol/L (ref 134–144)
Total Protein: 7.3 g/dL (ref 6.0–8.5)
eGFR: 59 mL/min/{1.73_m2} — ABNORMAL LOW (ref >59)

## 2020-08-07 LAB — LIPID PANEL
Chol/HDL Ratio: 4.2 ratio (ref 0.0–5.0)
Cholesterol, Total: 143 mg/dL (ref 100–199)
HDL: 34 mg/dL — ABNORMAL LOW (ref >39)
LDL Chol Calc (NIH): 70 mg/dL (ref 0–99)
Triglycerides: 240 mg/dL — ABNORMAL HIGH (ref 0–149)
VLDL Cholesterol Cal: 39 mg/dL (ref 5–40)

## 2020-08-07 LAB — CBC WITH DIFFERENTIAL/PLATELET
Basophils Absolute: 0.1 10*3/uL (ref 0.0–0.2)
Basos: 1 %
EOS (ABSOLUTE): 0.2 10*3/uL (ref 0.0–0.4)
Eos: 2 %
Hematocrit: 41.6 % (ref 37.5–51.0)
Hemoglobin: 13.5 g/dL (ref 13.0–17.7)
Immature Grans (Abs): 0 10*3/uL (ref 0.0–0.1)
Immature Granulocytes: 0 %
Lymphocytes Absolute: 2.7 10*3/uL (ref 0.7–3.1)
Lymphs: 29 %
MCH: 27.5 pg (ref 26.6–33.0)
MCHC: 32.5 g/dL (ref 31.5–35.7)
MCV: 85 fL (ref 79–97)
Monocytes Absolute: 0.7 10*3/uL (ref 0.1–0.9)
Monocytes: 8 %
Neutrophils Absolute: 5.5 10*3/uL (ref 1.4–7.0)
Neutrophils: 60 %
Platelets: 195 10*3/uL (ref 150–450)
RBC: 4.91 x10E6/uL (ref 4.14–5.80)
RDW: 13.9 % (ref 11.6–15.4)
WBC: 9.1 10*3/uL (ref 3.4–10.8)

## 2020-08-08 NOTE — Telephone Encounter (Signed)
Attempted ICM intro call to wife, Erskine Squibb per [place].  Left message with phone number to return call.

## 2020-08-09 ENCOUNTER — Other Ambulatory Visit: Payer: Medicare Other

## 2020-08-09 ENCOUNTER — Other Ambulatory Visit: Payer: Self-pay

## 2020-08-10 LAB — CMP14+EGFR
ALT: 28 IU/L (ref 0–44)
AST: 17 IU/L (ref 0–40)
Albumin/Globulin Ratio: 1.4 (ref 1.2–2.2)
Albumin: 4.6 g/dL (ref 3.8–4.9)
Alkaline Phosphatase: 130 IU/L — ABNORMAL HIGH (ref 44–121)
BUN/Creatinine Ratio: 18 (ref 9–20)
BUN: 26 mg/dL — ABNORMAL HIGH (ref 6–24)
Bilirubin Total: 0.3 mg/dL (ref 0.0–1.2)
CO2: 21 mmol/L (ref 20–29)
Calcium: 10.3 mg/dL — ABNORMAL HIGH (ref 8.7–10.2)
Chloride: 97 mmol/L (ref 96–106)
Creatinine, Ser: 1.42 mg/dL — ABNORMAL HIGH (ref 0.76–1.27)
Globulin, Total: 3.2 g/dL (ref 1.5–4.5)
Glucose: 466 mg/dL (ref 65–99)
Potassium: 5 mmol/L (ref 3.5–5.2)
Sodium: 138 mmol/L (ref 134–144)
Total Protein: 7.8 g/dL (ref 6.0–8.5)
eGFR: 59 mL/min/{1.73_m2} — ABNORMAL LOW (ref >59)

## 2020-08-22 ENCOUNTER — Other Ambulatory Visit: Payer: Self-pay | Admitting: Family Medicine

## 2020-09-05 NOTE — Telephone Encounter (Signed)
Spoke with wife, Erskine Squibb per [place].  Provided ICM intro and she agreed to monthly follow up on behalf of patient.  She stated he is doing well at this time and has no fluid symptoms.  Advised remote transmission scheduled for 10/09/2020.   Provided ICM phone number and encouraged to call if experiences any fluid symptoms.

## 2020-09-07 ENCOUNTER — Telehealth: Payer: Self-pay | Admitting: Family Medicine

## 2020-09-07 ENCOUNTER — Ambulatory Visit: Payer: Medicare Other | Admitting: Neurology

## 2020-09-07 NOTE — Telephone Encounter (Signed)
Per Dr. Louanne Skye pt needs to have a Driver's evaluation. Wife has been informed and she will discuss with pt and call back

## 2020-09-07 NOTE — Telephone Encounter (Signed)
Pt wife calling about getting a paper filled out, he had an appt with [name] and talked to him about it before but he was told he needed another dr to sign first. Needs to know if they need an appt

## 2020-09-10 ENCOUNTER — Telehealth: Payer: Self-pay | Admitting: Cardiovascular Disease

## 2020-09-10 NOTE — Telephone Encounter (Signed)
This RN called patient at [phone], spoke with patient and his wife together (ok per DPR).  Patient was requesting an appointment to be cleared to drive, this RN was able to get patient an appointment 5/25 at 3:15 pm with Marjie Skiff. Patient verbalized understanding, no additional questions at this time.

## 2020-09-10 NOTE — Telephone Encounter (Signed)
    Pt's wife calling to get an appt to see Dr. Allyson Sabal or APP as soon as possible pt needs clearance to drive, no available appt until June, and she said pt needs to be seen sooner because pt might get a ticket

## 2020-09-12 ENCOUNTER — Other Ambulatory Visit: Payer: Self-pay
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
## 2020-09-26 ENCOUNTER — Encounter: Payer: Self-pay | Admitting: Student

## 2020-09-26 ENCOUNTER — Other Ambulatory Visit: Payer: Self-pay | Admitting: Physician Assistant

## 2020-09-26 ENCOUNTER — Other Ambulatory Visit: Payer: Self-pay

## 2020-09-26 ENCOUNTER — Ambulatory Visit (INDEPENDENT_AMBULATORY_CARE_PROVIDER_SITE_OTHER): Payer: Medicare Other | Admitting: Student

## 2020-09-26 VITALS — BP 140/60 | HR 80 | Ht 70.0 in | Wt 198.4 lb

## 2020-09-26 DIAGNOSIS — N1831 Chronic kidney disease, stage 3a: Secondary | ICD-10-CM

## 2020-09-26 DIAGNOSIS — I10 Essential (primary) hypertension: Secondary | ICD-10-CM

## 2020-09-26 DIAGNOSIS — Z95810 Presence of automatic (implantable) cardiac defibrillator: Secondary | ICD-10-CM | POA: Diagnosis not present

## 2020-09-26 DIAGNOSIS — I255 Ischemic cardiomyopathy: Secondary | ICD-10-CM

## 2020-09-26 DIAGNOSIS — Z024 Encounter for examination for driving license: Secondary | ICD-10-CM

## 2020-09-26 DIAGNOSIS — E785 Hyperlipidemia, unspecified: Secondary | ICD-10-CM

## 2020-09-26 DIAGNOSIS — I2510 Atherosclerotic heart disease of native coronary artery without angina pectoris: Secondary | ICD-10-CM | POA: Diagnosis not present

## 2020-09-26 DIAGNOSIS — I5042 Chronic combined systolic (congestive) and diastolic (congestive) heart failure: Secondary | ICD-10-CM

## 2020-09-26 DIAGNOSIS — E118 Type 2 diabetes mellitus with unspecified complications: Secondary | ICD-10-CM

## 2020-09-26 DIAGNOSIS — Z794 Long term (current) use of insulin: Secondary | ICD-10-CM

## 2020-09-26 DIAGNOSIS — Z8674 Personal history of sudden cardiac arrest: Secondary | ICD-10-CM | POA: Diagnosis not present

## 2020-09-26 DIAGNOSIS — I6523 Occlusion and stenosis of bilateral carotid arteries: Secondary | ICD-10-CM

## 2020-09-26 MED ORDER — FUROSEMIDE 40 MG PO TABS: ORAL_TABLET | ORAL | 3 refills | Status: DC

## 2020-09-26 NOTE — Patient Instructions (Addendum)
Medication Instructions:   TAKE FUROSEMIDE 40 MG ONCE DAILY AS NEEDED FOR WEIGHT GAIN OF 3 LBS IN ONE DAY OR 5 LB IN ONE WEEK OR FOR SWELLING  *If you need a refill on your cardiac medications before your next appointment, please call your pharmacy   Testing/Procedures:  Your physician has requested that you have a carotid duplex. This test is an ultrasound of the carotid arteries in your neck. It looks at blood flow through these arteries that supply the brain with blood. Allow one hour for this exam. There are no restrictions or special instructions.SCHEDULE IN SEPTEMBER     Follow-Up: At [facility], you and your health needs are our priority.  As part of our continuing mission to provide you with exceptional heart care, we have created designated Provider Care Teams.  These Care Teams include your primary Cardiologist (physician) and Advanced Practice Providers (APPs -  Physician Assistants and Nurse Practitioners) who all work together to provide you with the care you need, when you need it.  We recommend signing up for the patient portal called "MyChart".  Sign up information is provided on this After Visit Summary.  MyChart is used to connect with patients for Virtual Visits (Telemedicine).  Patients are able to view lab/test results, encounter notes, upcoming appointments, etc.  Non-urgent messages can be sent to your provider as well.   To learn more about what you can do with MyChart, go to [email or web address].    Your next appointment:   3-4 month(s)  The format for your next appointment:   In Person  Provider:   Nanetta Batty, MD   KEEP LOG OF HEART RATE AND BLOOD PRESSURE FOR 2 WEEKS AND REPORTS THOSE NUMBERS  Heart Failure Education: 1. Weigh yourself EVERY morning after you go to the bathroom but before you eat or drink anything. Write this number down in a weight log/diary. If you gain 3 pounds overnight or 5 pounds in a week, take an extra dose of Lasix  40mg . 2. Take your medicines as prescribed. If you have concerns about your medications, please call before you stop taking them.  3. Eat low salt foods--Limit salt (sodium) to 2000 mg per day. This will help prevent your body from holding onto fluid. Read food labels as many processed foods have a lot of sodium, especially canned goods and prepackaged meats. If you would like some assistance choosing low sodium foods, we would be happy to set you up with a nutritionist. 4. Limit all fluids for the day to less than 2 liters (64 ounces). Fluid includes all drinks, coffee, juice, ice chips, soup, jello, and all other liquids. 5. Stay as active as you can everyday. Staying active will give you more energy and make your muscles stronger. Start with 5 minutes at a time and work your way up to 30 minutes a day. Break up your activities--do some in the morning and some in the afternoon. Start with 3 days per week and work your way up to 5 days as you can.  If you have chest pain, feel short of breath, dizzy, or lightheaded, STOP. If you don't feel better after a short rest, call 911. If you do feel better, call the office to let us know you have symptoms with exercise.

## 2020-10-09 ENCOUNTER — Ambulatory Visit (INDEPENDENT_AMBULATORY_CARE_PROVIDER_SITE_OTHER): Payer: Medicare Other

## 2020-10-09 DIAGNOSIS — I255 Ischemic cardiomyopathy: Secondary | ICD-10-CM

## 2020-10-10 ENCOUNTER — Ambulatory Visit (INDEPENDENT_AMBULATORY_CARE_PROVIDER_SITE_OTHER): Payer: Medicare Other

## 2020-10-10 DIAGNOSIS — I5042 Chronic combined systolic (congestive) and diastolic (congestive) heart failure: Secondary | ICD-10-CM

## 2020-10-10 DIAGNOSIS — Z95810 Presence of automatic (implantable) cardiac defibrillator: Secondary | ICD-10-CM

## 2020-10-11 ENCOUNTER — Telehealth: Payer: Self-pay

## 2020-10-11 NOTE — Telephone Encounter (Signed)
The patient wife state they are on their way back from the beach. She states he will do the transmission when they get home today.

## 2020-10-12 ENCOUNTER — Encounter (HOSPITAL_COMMUNITY): Payer: Medicare Other

## 2020-10-12 ENCOUNTER — Ambulatory Visit (HOSPITAL_COMMUNITY)
Admission: RE | Admit: 2020-10-12 | Payer: Medicare Other | Source: Ambulatory Visit | Attending: Cardiovascular Disease | Admitting: Cardiovascular Disease

## 2020-10-12 LAB — CUP PACEART REMOTE DEVICE CHECK
Battery Remaining Longevity: 98 mo
Battery Voltage: 2.99 V
Brady Statistic RV Percent Paced: 0 %
Date Time Interrogation Session: 20220609232609
HighPow Impedance: 63 Ohm
Implantable Lead Implant Date: 20180226
Implantable Lead Location: 753860
Implantable Pulse Generator Implant Date: 20180226
Lead Channel Impedance Value: 304 Ohm
Lead Channel Impedance Value: 399 Ohm
Lead Channel Pacing Threshold Amplitude: 0.75 V
Lead Channel Pacing Threshold Pulse Width: 0.4 ms
Lead Channel Sensing Intrinsic Amplitude: 11 mV
Lead Channel Sensing Intrinsic Amplitude: 11 mV
Lead Channel Setting Pacing Amplitude: 2.5 V
Lead Channel Setting Pacing Pulse Width: 0.4 ms
Lead Channel Setting Sensing Sensitivity: 0.3 mV

## 2020-10-12 NOTE — Progress Notes (Signed)
EPIC Encounter for ICM Monitoring  Patient Name: Douglas Carr is a 55 y.o. male Date: 10/12/2020 Primary Care Physican: [name], [name], MD Primary Cardiologist: Allyson Sabal Electrophysiologist: Elberta Fortis 09/26/2020 Office Weight: 198 lbs        1st ICM remote transmission.  Heart Failure questions reviewed.  Pt asymptomatic.   Optivol thoracic impedance normal   Prescribed: Furosemide 40 mg One tablet daily as needed for weight gain 3 lb in one day or 5 lb in 1 week or for swelling  Labs: 08/09/2020 Creatinine 1.42, BUN 26, Potassium 5.0, Sodium 138, GFR 59 08/06/2020 Creatinine 1.42, BUN 29, Potassium 6.1, Sodium 139, GFR 59  A complete set of results can be found in Results Review.  Recommendations:  Encouraged to call if experiencing fluid symptoms.  Follow-up plan: ICM clinic phone appointment on 11/19/2020.   91 day device clinic remote transmission 11/19/2020.    EP/Cardiology Office Visits: Last EP visit 2018. Message sent to EP scheduler to call to schedule EP appointment.  Copy of ICM check sent to Dr. Elberta Fortis.   3 month ICM trend: 10/11/2020.    1 Year ICM trend:       Karie Soda, RN 10/12/2020 12:59 PM

## 2020-10-22 ENCOUNTER — Telehealth: Payer: Self-pay | Admitting: Family Medicine

## 2020-10-23 ENCOUNTER — Other Ambulatory Visit: Payer: Self-pay

## 2020-10-23 MED ORDER — AMOXICILLIN 500 MG PO CAPS
500.0000 mg | ORAL_CAPSULE | Freq: Two times a day (BID) | ORAL | 0 refills | Status: DC
Start: 2020-10-23 — End: 2020-11-08

## 2020-10-23 MED ORDER — AMOXICILLIN 500 MG PO CAPS: 500.0000 mg | ORAL_CAPSULE | Freq: Two times a day (BID) | ORAL | 0 refills | Status: DC

## 2020-10-23 NOTE — Telephone Encounter (Signed)
Douglas Carr stated that meds need to be sent to [facility] [place], [place] - address is [address] [address]

## 2020-10-23 NOTE — Telephone Encounter (Signed)
Sent amoxicillin for patient to take prior to procedure, can start about 5 days before and finished about 5 days after.

## 2020-10-23 NOTE — Telephone Encounter (Signed)
Amoxicillin to [facility] [place]

## 2020-10-23 NOTE — Telephone Encounter (Signed)
They would like medication sent to a pharmacy near [facility]. They will not be home until Thursday.  Erskine Squibb will call back with pharmacy location.

## 2020-10-31 NOTE — Progress Notes (Signed)
Remote ICD transmission.   

## 2020-11-07 ENCOUNTER — Ambulatory Visit: Payer: Medicare Other | Admitting: Family Medicine

## 2020-11-08 ENCOUNTER — Ambulatory Visit (INDEPENDENT_AMBULATORY_CARE_PROVIDER_SITE_OTHER): Payer: Medicare Other | Admitting: Family Medicine

## 2020-11-08 ENCOUNTER — Other Ambulatory Visit: Payer: Self-pay

## 2020-11-08 ENCOUNTER — Encounter: Payer: Self-pay | Admitting: Family Medicine

## 2020-11-08 VITALS — BP 98/50 | HR 93 | Ht 70.0 in | Wt 196.0 lb

## 2020-11-08 DIAGNOSIS — E1169 Type 2 diabetes mellitus with other specified complication: Secondary | ICD-10-CM

## 2020-11-08 DIAGNOSIS — E118 Type 2 diabetes mellitus with unspecified complications: Secondary | ICD-10-CM

## 2020-11-08 DIAGNOSIS — I10 Essential (primary) hypertension: Secondary | ICD-10-CM

## 2020-11-08 DIAGNOSIS — I255 Ischemic cardiomyopathy: Secondary | ICD-10-CM

## 2020-11-08 DIAGNOSIS — E669 Obesity, unspecified: Secondary | ICD-10-CM

## 2020-11-08 DIAGNOSIS — E785 Hyperlipidemia, unspecified: Secondary | ICD-10-CM | POA: Diagnosis not present

## 2020-11-08 DIAGNOSIS — I25119 Atherosclerotic heart disease of native coronary artery with unspecified angina pectoris: Secondary | ICD-10-CM

## 2020-11-08 DIAGNOSIS — F32A Depression, unspecified: Secondary | ICD-10-CM

## 2020-11-08 DIAGNOSIS — Z23 Encounter for immunization: Secondary | ICD-10-CM

## 2020-11-08 DIAGNOSIS — F419 Anxiety disorder, unspecified: Secondary | ICD-10-CM

## 2020-11-08 LAB — BAYER DCA HB A1C WAIVED: HB A1C (BAYER DCA - WAIVED): 9.4 % — ABNORMAL HIGH (ref <7.0)

## 2020-11-08 MED ORDER — SEMAGLUTIDE (2 MG/DOSE) 8 MG/3ML ~~LOC~~ SOPN: 2.0000 mg | PEN_INJECTOR | SUBCUTANEOUS | 11 refills | Status: DC

## 2020-11-08 NOTE — Progress Notes (Signed)
BP (!) 98/50   Pulse 93   Ht _0  (1.778 m)   Wt 196 lb (88.9 kg)   SpO2 99%   BMI 28.12 kg/m    Subjective:   Patient ID: Douglas Carr, male    DOB: 05/30/1965, 55 y.o.   MRN: [id number]  HPI: Douglas Carr is a 55 y.o. male presenting on 11/08/2020 for No chief complaint on file.   HPI Type 2 diabetes mellitus Patient comes in today for recheck of his diabetes. Patient has been currently taking Ozempic. Patient is currently on an ACE inhibitor/ARB. Patient has not seen an ophthalmologist this year. Patient denies any issues with their feet. The symptom started onset as an adult hypertension and hyperlipidemia and CAD ARE RELATED TO DM   Hypertension and CAD Patient is currently on furosemide and Entresto and isosorbide dinitrate and carvedilol, and their blood pressure today is 98/50, on the low side, recommended he talk to his cardiologist about reducing or adjusting some of his medicines if they deem it necessary. Patient denies any lightheadedness or dizziness. Patient denies headaches, blurred vision, chest pains, shortness of breath, or weakness. Denies any side effects from medication and is content with current medication.   Hyperlipidemia Patient is coming in for recheck of his hyperlipidemia. The patient is currently taking atorvastatin. They deny any issues with myalgias or history of liver damage from it. They deny any focal numbness or weakness or chest pain.   Anxiety depression recheck Patient feels like he is doing well on his anxiety depression.  He is currently taking Remeron which is helping him sleep and he feels like he is doing better and his wife is here with him and also feels like he is doing better.  Denies any suicidal ideations or thoughts of hurting self. Depression screen [facility] 2/9 11/08/2020 04/12/2020 02/14/2020 09/27/2019 06/30/2019  Decreased Interest 0 0 0 0 0  Down, Depressed, Hopeless 0 0 0 0 1  PHQ - 2 Score 0 0 0 0 1  Altered sleeping -  - - 0 -  Tired, decreased energy - - - 0 -  Change in appetite - - - 0 -  Feeling bad or failure about yourself  - - - 0 -  Trouble concentrating - - - 0 -  Moving slowly or fidgety/restless - - - 0 -  Suicidal thoughts - - - 0 -  PHQ-9 Score - - - 0 -  Difficult doing work/chores - - - Not difficult at all -  Some recent data might be hidden     Relevant past medical, surgical, family and social history reviewed and updated as indicated. Interim medical history since our last visit reviewed. Allergies and medications reviewed and updated.  Review of Systems  Constitutional:  Negative for chills and fever.  Respiratory:  Negative for shortness of breath and wheezing.   Cardiovascular:  Negative for chest pain and leg swelling.  Musculoskeletal:  Negative for back pain and gait problem.  Skin:  Negative for rash.  Psychiatric/Behavioral:  Negative for dysphoric mood and sleep disturbance. The patient is not nervous/anxious.   All other systems reviewed and are negative.  Per HPI unless specifically indicated above   Allergies as of 11/08/2020   No Known Allergies      Medication List        Accurate as of November 08, 2020  4:12 PM. If you have any questions, ask your nurse or doctor.  STOP taking these medications    amoxicillin 500 MG capsule Commonly known as: AMOXIL Stopped by: Fransisca Kaufmann Liyla Radliff, MD       TAKE these medications    Aspirin Low Dose 81 MG EC tablet Generic drug: aspirin TAKE ONE TABLET BY MOUTH DAILY   atorvastatin 80 MG tablet Commonly known as: LIPITOR Take 1 tablet (80 mg total) by mouth every evening.   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 Dispense based on patient and insurance preference. Use up to four times daily as directed. (FOR ICD-9 250.00,  250.01).   carvedilol 6.25 MG tablet Commonly known as: COREG Take 1 tablet (6.25 mg total) by mouth 2 (two) times daily with a meal.   clopidogrel 75 MG tablet Commonly known as: PLAVIX TAKE ONE TABLET BY MOUTH IN THE EVENING   DULoxetine 60 MG capsule Commonly known as: CYMBALTA Take 2 capsules (120 mg total) by mouth daily. What changed: how much to take   Entresto 24-26 MG Generic drug: sacubitril-valsartan TAKE ONE TABLET BY MOUTH TWICE DAILY   furosemide 40 MG tablet Commonly known as: LASIX Take 1 tablet (40 mg total) by mouth daily. What changed:  how much to take when to take this   furosemide 40 MG tablet Commonly known as: LASIX One tablet daily as needed for weight gain 3 lb in one day or 5 lb in 1 week or for swelling What changed: Another medication with the same name was changed. Make sure you understand how and when to take each.   hydrOXYzine 25 MG tablet Commonly known as: ATARAX/VISTARIL Take 1 tablet (25 mg total) by mouth 3 (three) times daily as needed.   isosorbide dinitrate 10 MG tablet Commonly known as: ISORDIL Take 1 tablet (10 mg total) by mouth 2 (two) times daily.   mirtazapine 7.5 MG tablet Commonly known as: REMERON Take 1 tablet (7.5 mg total) by mouth at bedtime. TAKE ONE TABLET BY MOUTH AT BEDTIME FOR DEPRESSION   OneTouch Delica Lancets 16X Misc Test blood sugars three times daily   OneTouch Verio test strip Generic drug: glucose blood 1 each by Other route in the morning, at noon, in the evening, and at bedtime. use for testing   Ozempic (1 MG/DOSE) 2 MG/1.5ML Sopn Generic drug: Semaglutide (1 MG/DOSE) Inject 1 mg into the skin once a week.   primidone 50 MG tablet Commonly known as: MYSOLINE Take 1 tablet (50 mg total) by mouth at bedtime.   tamsulosin 0.4 MG Caps capsule Commonly known as: FLOMAX TAKE 1 CAPSULE DAILY IN THE EVENING   topiramate 50 MG tablet Commonly known as: TOPAMAX Take 1 tablet (50 mg total) by  mouth at bedtime as needed. What changed: reasons to take this         Objective:   Pulse 93   Ht _0  (1.778 m)   Wt 196 lb (88.9 kg)   SpO2 99%   BMI 28.12 kg/m   Wt Readings from Last 3 Encounters:  11/08/20 196 lb (88.9 kg)  09/26/20 198 lb 6.4 oz (90 kg)  08/06/20 196 lb (88.9 kg)    Physical Exam Vitals and nursing note reviewed.  Constitutional:      General: He is not in acute distress.    Appearance: He is  well-developed. He is not diaphoretic.     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       Right eye: No discharge.     Conjunctiva/sclera: Conjunctivae normal.  Neck:     Thyroid: No thyromegaly.  Cardiovascular:     Rate and Rhythm: Normal rate and regular rhythm.     Heart sounds: Normal heart sounds. No murmur heard. Pulmonary:     Effort: Pulmonary effort is normal. No respiratory distress.     Breath sounds: Normal breath sounds. No wheezing.  Musculoskeletal:     Cervical back: Neck supple.  Lymphadenopathy:     Cervical: No cervical adenopathy.  Skin:    General: Skin is warm and dry.     Findings: No rash.  Neurological:     Mental Status: He is alert and oriented to person, place, and time.     Coordination: Coordination normal.  Psychiatric:        Behavior: Behavior normal.      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 Refer to gastroenterology for screening. A1c was 9.4 which is improved but still high, hopefully the increase Ozempic will do it if not we will add [place] in the future.  Blood pressure low, recommended talking to cardiology. Follow up plan: Return in about 3 months (around 02/08/2021), or if symptoms worsen or fail to improve, for Diabetes and hypertension and cholesterol and anxiety.  Counseling provided for all of the vaccine components No orders of the defined types were placed in this encounter.   Caryl Pina, MD [facility] 11/08/2020, 4:12 PM

## 2020-11-09 ENCOUNTER — Telehealth: Payer: Self-pay | Admitting: Cardiovascular Disease

## 2020-11-09 LAB — CMP14+EGFR
ALT: 12 IU/L (ref 0–44)
AST: 10 IU/L (ref 0–40)
Albumin/Globulin Ratio: 1.8 (ref 1.2–2.2)
Albumin: 4.4 g/dL (ref 3.8–4.9)
Alkaline Phosphatase: 107 IU/L (ref 44–121)
BUN/Creatinine Ratio: 21 — ABNORMAL HIGH (ref 9–20)
BUN: 39 mg/dL — ABNORMAL HIGH (ref 6–24)
Bilirubin Total: <0.2 mg/dL (ref 0.0–1.2)
CO2: 22 mmol/L (ref 20–29)
Calcium: 9.2 mg/dL (ref 8.7–10.2)
Chloride: 102 mmol/L (ref 96–106)
Creatinine, Ser: 1.89 mg/dL — ABNORMAL HIGH (ref 0.76–1.27)
Globulin, Total: 2.5 g/dL (ref 1.5–4.5)
Glucose: 285 mg/dL — ABNORMAL HIGH (ref 65–99)
Potassium: 5.2 mmol/L (ref 3.5–5.2)
Sodium: 137 mmol/L (ref 134–144)
Total Protein: 6.9 g/dL (ref 6.0–8.5)
eGFR: 42 mL/min/{1.73_m2} — ABNORMAL LOW (ref >59)

## 2020-11-09 NOTE — Telephone Encounter (Signed)
Returned call to Pt-spoke with wife per DPR.  Per wife, Pt recently had a visit with his PCP and PCP was concerned that blood pressure was 93/58 and Pt stated that he was dizzy.  After further discussion with wife and Pt-Pt does have some lightheadedness from time to time.  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  Pt has not been keeping a log of his blood pressures.  Pt will start keeping a log of blood pressures and will start taking blood pressure when he is dizzy. They will monitor and call back with report.  Also advised that with current heat outside Pt should be conscious to make sure he is well hydrated.  Pt and wife in agreement with plan.

## 2020-11-09 NOTE — Telephone Encounter (Signed)
    Pt c/o BP issue: STAT if pt c/o blurred vision, one-sided weakness or slurred speech  1. What are your last 5 BP readings? 93/58  2. Are you having any other symptoms (ex. Dizziness, headache, blurred vision, passed out)? A little dizzy  3. What is your BP issue? Pt's wife said pt. Saw pcp and his BP was low, pt sometimes feels dizzy and sometimes will feel he is going to passed out, pcp advised to call Dr. Allyson Sabal to adjust some of hie meds

## 2020-11-19 ENCOUNTER — Ambulatory Visit (INDEPENDENT_AMBULATORY_CARE_PROVIDER_SITE_OTHER): Payer: Medicare Other

## 2020-11-19 DIAGNOSIS — Z95810 Presence of automatic (implantable) cardiac defibrillator: Secondary | ICD-10-CM

## 2020-11-19 DIAGNOSIS — I5042 Chronic combined systolic (congestive) and diastolic (congestive) heart failure: Secondary | ICD-10-CM | POA: Diagnosis not present

## 2020-11-21 ENCOUNTER — Telehealth: Payer: Self-pay

## 2020-11-21 NOTE — Telephone Encounter (Signed)
Remote ICM transmission received.  Attempted call to wife/patient regarding ICM remote transmission and left detailed message per DPR.  Advised to return call for any fluid symptoms or questions. Next ICM remote transmission scheduled 12/24/2020.

## 2020-11-21 NOTE — Progress Notes (Signed)
EPIC Encounter for ICM Monitoring  Patient Name: Douglas Carr is a 55 y.o. male Date: 11/21/2020 Primary Care Physican: [name], [name], MD Primary Cardiologist: Allyson Sabal Electrophysiologist: Elberta Fortis 09/26/2020 Office Weight: 198 lbs                                                          Attempted call to wife and unable to reach.  Left detailed message per DPR regarding transmission. Transmission reviewed.    Optivol thoracic impedance normal    Prescribed: Furosemide 40 mg One tablet daily as needed for weight gain 3 lb in one day or 5 lb in 1 week or for swelling   Labs: 08/09/2020 Creatinine 1.42, BUN 26, Potassium 5.0, Sodium 138, GFR 59 08/06/2020 Creatinine 1.42, BUN 29, Potassium 6.1, Sodium 139, GFR 59  A complete set of results can be found in Results Review.   Recommendations:  Left voice mail with ICM number and encouraged to call if experiencing any fluid symptoms.   Follow-up plan: ICM clinic phone appointment on 12/24/2020.   91 day device clinic remote transmission 01/08/2021.     EP/Cardiology Office Visits: 12/03/2020 with Dr Elberta Fortis.  01/02/2021 with Dr Allyson Sabal.   Copy of ICM check sent to Dr. Elberta Fortis.   3 month ICM trend: 11/19/2020.    1 Year ICM trend:       Karie Soda, RN 11/21/2020 4:27 PM

## 2020-12-03 ENCOUNTER — Encounter: Payer: Medicare Other | Admitting: Cardiology

## 2020-12-18 ENCOUNTER — Other Ambulatory Visit: Payer: Self-pay | Admitting: Family Medicine

## 2020-12-18 DIAGNOSIS — F32A Depression, unspecified: Secondary | ICD-10-CM

## 2020-12-18 DIAGNOSIS — F419 Anxiety disorder, unspecified: Secondary | ICD-10-CM

## 2020-12-19 NOTE — Progress Notes (Deleted)
Electrophysiology Office Note Date: 12/19/2020  ID:  Douglas Carr, DOB 1965/08/26, MRN [id number]  PCP: [name], Fransisca Kaufmann, MD Primary Cardiologist: Quay Burow, MD Electrophysiologist: Constance Haw, MD   CC: Routine ICD follow-up  Douglas Carr is a 55 y.o. male seen today for Will Meredith Leeds, MD for routine electrophysiology followup after a long absence.   Patient has a long history of CAD. He reportedly had a MI at the age of 68. Cath at that time reportedly showed occluded vessels with collaterals so medical therapy was recommended. Cath in 2010 showed no significant findings. He as admitted with STEMI in 05/2016 with was treated with DES to OM2. Echo at that time showed LVEF of 30%. He was readmitted later that month with VT/VF arrest. ROSC was obtained after about 10 minutes of CPR/ACLS. Emergent cath showed 90% stenosis at the origin of the previous stented vessel with 70% diffuse proximal AV groove of LCX and possible proximal edge dissection with thrombus. He underwent successful PCI with DES overlapping prior stent. He had a prolonged hospital course and ultimately had ICD placed in 06/2016 for secondary prevention of sudden death. He was readmitted with NSTEMI in 01/2020. Cath showed severe 3 vessel CAD with severely elevated LVEDP of 57mH consistent with acute on chronic combined CHF. Echo showed LVEF of 30% with global hypokinesis and grade III diastolic dysfunction and mild MR. Patient was diuresed with IV Lasix. CT surgery was consulted and patient underwent CABG x5 (LIMA to LAD, sequential SVG to PDA and right PLA, left radial artery to OM, RIMA to 1st Diag).  Last seen by gen cards 09/26/20. Was doing well overall.   Last seen by Dr. CCurt Bears6/2018. .Marland Kitchen Since last being seen in our clinic the patient reports doing ***.  he denies chest pain, palpitations, dyspnea, PND, orthopnea, nausea, vomiting, dizziness, syncope, edema, weight gain, or early satiety.  {He/she (caps):30048} has not had ICD shocks.   Device History: Medtronic Single Chamber ICD implanted 06/2016 for ICM   Past Medical History:  Diagnosis Date   Acute ST elevation myocardial infarction (STEMI) involving left circumflex coronary artery ([facility] 05/07/2016   PCI to Cx-OM   Anginal pain ([facility]    secondary to sm. 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 05/13/2016   Coronary artery disease involving native coronary artery of native heart with angina pectoris ([facility] 05/24/2016   Remote MI at 55years of age, Last cath 2010-diffuse non-obstructive disease; last echo 06/16/08 -normal LV function, moderate concentric hypertrophy; nuc 08/2008 no ischemia;  medical therapy; STEMI May 07 2016 - PCI to Cx-OM   Diabetes mellitus    ON ORAL MEDICATION AND INSULIN   Dilated cardiomyopathy ([facility] 05/24/2016   EF 325-30% by Echo post STEMI (previously 30-35%)    Hyperlipidemia    Hypertension    Myocardial infarction ([facility] 1996   Post MI   Peripheral vascular disease ([facility]    HAS LEFT CAROTID ARTERY STENOSIS   AND IS S/P RIGHT CAROTID ENDARTERECTOMY 2010 Last carotid dopplers 01/08/2012 wth patent endarterectomy site   Status post coronary artery stent placement    Past Surgical History:  Procedure Laterality Date   APPENDECTOMY     CARDIAC CATHETERIZATION  FEB 2010, significant branch vessel disease wth diag, marginal, PDA & PLA, nml. LV function   CARDIAC CATHETERIZATION N/A 05/07/2016   Procedure: Left Heart Cath and Coronary Angiography;  Surgeon: Peter M Martinique, MD;  Location: [facility];  Service: Cardiovascular;  Laterality: N/A;   CARDIAC CATHETERIZATION N/A 05/07/2016   Procedure: Coronary Stent Intervention;  Surgeon: Peter M Martinique, MD;  Location: [facility];  Service: Cardiovascular;   Laterality: N/A;   CARDIAC CATHETERIZATION N/A 05/24/2016   Procedure: Left Heart Cath and Coronary Angiography;  Surgeon: [name], MD;  Location: [facility];  Service: Cardiovascular;  Laterality: N/A;   CARDIAC CATHETERIZATION N/A 05/24/2016   Procedure: Coronary Stent Intervention;  Surgeon: [name], MD;  Location: [facility];  Service: Cardiovascular;  Laterality: N/A;  2.5x20 Promus to Ostial/proximal circumflex   CAROTID ENDARTERECTOMY  09/2008   Rt CEA   CLIPPING OF ATRIAL APPENDAGE Left 01/27/2020   Procedure: CLIPPING OF ATRIAL APPENDAGE USING ATRICURE 37 MM ATRICLIP;  Surgeon: Wonda Olds, MD;  Location: [facility];  Service: Open Heart Surgery;  Laterality: Left;   CORONARY ARTERY BYPASS GRAFT N/A 01/27/2020   Procedure: CORONARY ARTERY BYPASS GRAFTING (CABG), ON PUMP, TIMES FIVE, USING BILATERAL MAMMARY ARTERIES, LEFT RADIAL ARTERY, AND ENDOSCOPICALLY HARVESTED RIGHT GREATER SAPHENOUS VEIN.;  Surgeon: Wonda Olds, MD;  Location: [facility];  Service: Open Heart Surgery;  Laterality: N/A;  LIMA->LAD RIMA->D1 (free graft) LRA-> OM SVG-> PDA->PL   ENDOVEIN HARVEST OF GREATER SAPHENOUS VEIN Right 01/27/2020   Procedure: ENDOVEIN HARVEST OF GREATER SAPHENOUS VEIN;  Surgeon: Wonda Olds, MD;  Location: [facility];  Service: Open Heart Surgery;  Laterality: Right;   ICD IMPLANT N/A 06/30/2016   Procedure: ICD Implant;  Surgeon: Will Meredith Leeds, MD;  Location: [facility];  Service: Cardiovascular;  Laterality: N/A;   INTRAVASCULAR PRESSURE WIRE/FFR STUDY N/A 01/24/2020   Procedure: INTRAVASCULAR PRESSURE WIRE/FFR STUDY;  Surgeon: Belva Crome, MD;  Location: [facility];  Service: Cardiovascular;  Laterality: N/A;   LUNG BIOPSY  2013   Bx's suggest granulomatous dz.   RADIAL ARTERY HARVEST Left 01/27/2020   Procedure: RADIAL ARTERY HARVEST;  Surgeon: Wonda Olds, MD;  Location: [facility];  Service: Open Heart Surgery;  Laterality: Left;    RIGHT/LEFT HEART CATH AND CORONARY ANGIOGRAPHY N/A 01/24/2020   Procedure: RIGHT/LEFT HEART CATH AND CORONARY ANGIOGRAPHY;  Surgeon: Belva Crome, MD;  Location: [facility];  Service: Cardiovascular;  Laterality: N/A;   RT ANKLE   2013   TEE WITHOUT CARDIOVERSION N/A 01/27/2020   Procedure: TRANSESOPHAGEAL ECHOCARDIOGRAM (TEE);  Surgeon: Wonda Olds, MD;  Location: [facility];  Service: Open Heart Surgery;  Laterality: N/A;    Current Outpatient Medications  Medication Sig Dispense Refill   ASPIRIN LOW DOSE 81 MG EC tablet TAKE ONE TABLET BY MOUTH DAILY 30 tablet 5   atorvastatin (LIPITOR) 80 MG tablet Take 1 tablet (80 mg total) by mouth every evening. 90 tablet 3   blood glucose meter kit and supplies KIT Inject 1 each into the skin 4 (four) times daily as needed. Dispense based on patient and insurance preference. Use up to four times daily as directed. (FOR ICD-9 250.00, 250.01). 1 each 0   carvedilol (COREG) 6.25 MG tablet Take 1 tablet (6.25 mg total) by mouth 2 (two) times daily with a meal. 180 tablet 3   clopidogrel (PLAVIX) 75 MG tablet TAKE  ONE TABLET BY MOUTH IN THE EVENING 90 tablet 3   DULoxetine (CYMBALTA) 60 MG capsule Take 2 capsules (120 mg total) by mouth daily. (Patient taking differently: Take 60 mg by mouth daily.) 180 capsule 3   ENTRESTO 24-26 MG TAKE ONE TABLET BY MOUTH TWICE DAILY 180 tablet 3   furosemide (LASIX) 40 MG tablet Take 1 tablet (40 mg total) by mouth daily. (Patient taking differently: Take 20 mg by mouth 2 (two) times daily.) 90 tablet 3   furosemide (LASIX) 40 MG tablet One tablet daily as needed for weight gain 3 lb in one day or 5 lb in 1 week or for swelling 90 tablet 3   glucose blood (ONETOUCH VERIO) test strip 1 each by Other route in the morning, at noon, in the evening, and at bedtime. use for testing 120 each 11   hydrOXYzine (ATARAX/VISTARIL) 25 MG tablet TAKE ONE TABLET BY MOUTH THREE TIMES DAILY AS NEEDED 90 tablet 1   Insulin Pen  Needle (B-D ULTRAFINE III SHORT PEN) 31G X 8 MM MISC USE TO INJECT INSULIN DAILY DX E11.9 100 each 3   isosorbide dinitrate (ISORDIL) 10 MG tablet Take 1 tablet (10 mg total) by mouth 2 (two) times daily. 60 tablet 10   mirtazapine (REMERON) 7.5 MG tablet Take 1 tablet (7.5 mg total) by mouth at bedtime. TAKE ONE TABLET BY MOUTH AT BEDTIME FOR DEPRESSION 90 tablet 3   OneTouch Delica Lancets 31V MISC Test blood sugars three times daily 100 each 11   primidone (MYSOLINE) 50 MG tablet Take 1 tablet (50 mg total) by mouth at bedtime. 90 tablet 3   Semaglutide, 2 MG/DOSE, 8 MG/3ML SOPN Inject 2 mg as directed once a week. 6 mL 11   tamsulosin (FLOMAX) 0.4 MG CAPS capsule TAKE 1 CAPSULE DAILY IN THE EVENING 90 capsule 3   topiramate (TOPAMAX) 50 MG tablet Take 1 tablet (50 mg total) by mouth at bedtime as needed. (Patient taking differently: Take 50 mg by mouth at bedtime as needed (anxiety).) 90 tablet 3   No current facility-administered medications for this visit.    Allergies:   Patient has no known allergies.   Social History: Social History   Socioeconomic History   Marital status: Married    Spouse name: [name]   Number of children: 3   Years of education: Not on file   Highest education level: Not on file  Occupational History   Occupation: Agricultural consultant for [employer]    Employer: [employer]   Tobacco Use   Smoking status: Former    Packs/day: 2.00    Years: 22.00    Pack years: 44.00    Types: Cigarettes    Quit date: 10/23/2011    Years since quitting: 9.1   Smokeless tobacco: Current    Types: Snuff  Vaping Use   Vaping Use: Never used  Substance and Sexual Activity   Alcohol use: No   Drug use: No   Sexual activity: Not on file  Other Topics Concern   Not on file  Social History Narrative   Pt lives with family in [place], [place].   Social Determinants of Health   Financial Resource Strain: Not on file  Food Insecurity: Not on file  Transportation Needs: Not on file  Physical  Activity: Not on file  Stress: Not on file  Social Connections: Not on file  Intimate Partner Violence: Not on file    Family History: Family History  Problem Relation Age of Onset  Hypertension Mother    Diabetes Mother    Hypertension Maternal Grandfather    Heart attack Paternal Grandfather    Heart attack Father 65    Review of Systems: All other systems reviewed and are otherwise negative except as noted above.   Physical Exam: There were no vitals filed for this visit.   GEN- The patient is well appearing, alert and oriented x 3 today.   HEENT: normocephalic, atraumatic; sclera clear, conjunctiva pink; hearing intact; oropharynx clear; neck supple, no JVP Lymph- no cervical lymphadenopathy Lungs- Clear to ausculation bilaterally, normal work of breathing.  No wheezes, rales, rhonchi Heart- Regular rate and rhythm, no murmurs, rubs or gallops, PMI not laterally displaced GI- soft, non-tender, non-distended, bowel sounds present, no hepatosplenomegaly Extremities- no clubbing or cyanosis. No edema; DP/PT/radial pulses 2+ bilaterally MS- no significant deformity or atrophy Skin- warm and dry, no rash or lesion; ICD pocket well healed Psych- euthymic mood, full affect Neuro- strength and sensation are intact  ICD interrogation- reviewed in detail today,  See PACEART report  EKG:  EKG {ACTION; IS/IS [id number]} ordered today. Personal review of EKG ordered {Blank single:19197::"today","***"} shows  Recent Labs: 01/22/2020: B Natriuretic Peptide 788.0 01/28/2020: Magnesium 2.5 08/06/2020: Hemoglobin 13.5; Platelets 195 11/08/2020: ALT 12; BUN 39; Creatinine, Ser 1.89; Potassium 5.2; Sodium 137   Wt Readings from Last 3 Encounters:  11/08/20 196 lb (88.9 kg)  09/26/20 198 lb 6.4 oz (90 kg)  08/06/20 196 lb (88.9 kg)     Other studies Reviewed: Additional studies/ records that were reviewed today include: ***   Assessment and Plan:  1.  Chronic systolic dysfunction  s/p Medtronic single chamber ICD  euvolemic today Stable on an appropriate medical regimen Normal ICD function See Pace Art report No changes today  2. 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  Stable device.    3. Hyperlipidemia:  Per gen cards.   Current medicines are reviewed at length with the patient today.   The patient {ACTIONS; HAS/DOES NOT HAVE:19233} concerns regarding his medicines.  The following changes were made today:  {NONE DEFAULTED:18576}  Labs/ tests ordered today include: *** No orders of the defined types were placed in this encounter.    Disposition:   Follow up with {Blank single:19197::"Dr. Allred","Dr. Arlan Organ. Klein","Dr. Camnitz","Dr. Lambert","EP APP"}  {gen number [id number]} {TIME; UNITS DAY/WEEK/MONTH:19136}   Signed, Shirley Friar, PA-C  12/19/2020 3:48 PM  [place] [address] [phone] (office) [phone] (fax)

## 2020-12-20 ENCOUNTER — Encounter: Payer: Medicare Other | Admitting: Student

## 2020-12-20 DIAGNOSIS — I2510 Atherosclerotic heart disease of native coronary artery without angina pectoris: Secondary | ICD-10-CM

## 2020-12-20 DIAGNOSIS — Z95810 Presence of automatic (implantable) cardiac defibrillator: Secondary | ICD-10-CM

## 2020-12-20 DIAGNOSIS — I5042 Chronic combined systolic (congestive) and diastolic (congestive) heart failure: Secondary | ICD-10-CM

## 2020-12-24 ENCOUNTER — Ambulatory Visit (INDEPENDENT_AMBULATORY_CARE_PROVIDER_SITE_OTHER): Payer: Medicare Other

## 2020-12-24 DIAGNOSIS — I5042 Chronic combined systolic (congestive) and diastolic (congestive) heart failure: Secondary | ICD-10-CM

## 2020-12-24 DIAGNOSIS — Z95810 Presence of automatic (implantable) cardiac defibrillator: Secondary | ICD-10-CM | POA: Diagnosis not present

## 2020-12-28 NOTE — Progress Notes (Signed)
EPIC Encounter for ICM Monitoring  Patient Name: Douglas Carr is a 56 y.o. male Date: 12/28/2020 Primary Care Physican: [name], [name], MD Primary Cardiologist: Allyson Sabal Electrophysiologist: Elberta Fortis 09/26/2020 Office Weight: 198 lbs                                                          Transmission reviewed.    Optivol thoracic impedance normal    Prescribed: Furosemide 40 mg One tablet daily as needed for weight gain 3 lb in one day or 5 lb in 1 week or for swelling   Labs: 08/09/2020 Creatinine 1.42, BUN 26, Potassium 5.0, Sodium 138, GFR 59 08/06/2020 Creatinine 1.42, BUN 29, Potassium 6.1, Sodium 139, GFR 59  A complete set of results can be found in Results Review.   Recommendations:  No changes.   Follow-up plan: ICM clinic phone appointment on 02/04/2021.   91 day device clinic remote transmission 01/08/2021.     EP/Cardiology Office Visits: 01/02/2021 with Dr Allyson Sabal.   Copy of ICM check sent to Dr. Elberta Fortis.    3 month ICM trend: 12/28/2020.    1 Year ICM trend:       Karie Soda, RN 12/28/2020 3:00 PM

## 2021-01-02 ENCOUNTER — Ambulatory Visit: Payer: Medicare Other | Admitting: Cardiovascular Disease

## 2021-01-08 ENCOUNTER — Ambulatory Visit (INDEPENDENT_AMBULATORY_CARE_PROVIDER_SITE_OTHER): Payer: Medicare Other

## 2021-01-08 DIAGNOSIS — I255 Ischemic cardiomyopathy: Secondary | ICD-10-CM

## 2021-01-08 LAB — CUP PACEART REMOTE DEVICE CHECK
Battery Remaining Longevity: 96 mo
Battery Voltage: 2.99 V
Brady Statistic RV Percent Paced: 0 %
Date Time Interrogation Session: 20220906063327
HighPow Impedance: 62 Ohm
Implantable Lead Implant Date: 20180226
Implantable Lead Location: 753860
Implantable Pulse Generator Implant Date: 20180226
Lead Channel Impedance Value: 304 Ohm
Lead Channel Impedance Value: 399 Ohm
Lead Channel Pacing Threshold Amplitude: 0.875 V
Lead Channel Pacing Threshold Pulse Width: 0.4 ms
Lead Channel Sensing Intrinsic Amplitude: 15.125 mV
Lead Channel Sensing Intrinsic Amplitude: 15.125 mV
Lead Channel Setting Pacing Amplitude: 2.5 V
Lead Channel Setting Pacing Pulse Width: 0.4 ms
Lead Channel Setting Sensing Sensitivity: 0.3 mV

## 2021-01-17 NOTE — Progress Notes (Signed)
Remote ICD transmission.   

## 2021-01-24 ENCOUNTER — Other Ambulatory Visit: Payer: Self-pay | Admitting: Physician Assistant

## 2021-01-24 ENCOUNTER — Other Ambulatory Visit: Payer: Self-pay | Admitting: Family Medicine

## 2021-01-24 DIAGNOSIS — F32A Depression, unspecified: Secondary | ICD-10-CM

## 2021-01-24 DIAGNOSIS — F419 Anxiety disorder, unspecified: Secondary | ICD-10-CM

## 2021-02-04 ENCOUNTER — Ambulatory Visit (INDEPENDENT_AMBULATORY_CARE_PROVIDER_SITE_OTHER): Payer: Medicare Other

## 2021-02-04 DIAGNOSIS — I5042 Chronic combined systolic (congestive) and diastolic (congestive) heart failure: Secondary | ICD-10-CM

## 2021-02-04 DIAGNOSIS — Z95810 Presence of automatic (implantable) cardiac defibrillator: Secondary | ICD-10-CM | POA: Diagnosis not present

## 2021-02-06 ENCOUNTER — Telehealth: Payer: Self-pay

## 2021-02-06 NOTE — Telephone Encounter (Signed)
Remote ICM transmission received.  Attempted call to patient regarding ICM remote transmission and no answer or voice mail option.  

## 2021-02-06 NOTE — Progress Notes (Signed)
EPIC Encounter for ICM Monitoring  Patient Name: Douglas Carr is a 55 y.o. male Date: 02/06/2021 Primary Care Physican: [name], [name], MD Primary Cardiologist: Allyson Sabal Electrophysiologist: Elberta Fortis 09/26/2020 Office Weight: 198 lbs                                                          Attempted call to patient and unable to reach.  Transmission reviewed.    Optivol thoracic impedance normal    Prescribed: Furosemide 40 mg One tablet daily as needed for weight gain 3 lb in one day or 5 lb in 1 week or for swelling   Labs: 11/08/2020 Creatinine 1.89, BUN 39, Potassium 5.2, Sodium 137, GFR 08/09/2020 Creatinine 1.42, BUN 26, Potassium 5.0, Sodium 138, GFR 59 08/06/2020 Creatinine 1.42, BUN 29, Potassium 6.1, Sodium 139, GFR 59  A complete set of results can be found in Results Review.   Recommendations:  Unable to reach.     Follow-up plan: ICM clinic phone appointment on 03/11/2021.   91 day device clinic remote transmission 04/09/2021.     EP/Cardiology Office Visits:  None and no recalls   Copy of ICM check sent to Dr. Elberta Fortis.     3 month ICM trend: 02/04/2021.    1 Year ICM trend:       Karie Soda, RN 02/06/2021 4:27 PM

## 2021-02-08 ENCOUNTER — Ambulatory Visit: Payer: Medicare Other | Admitting: Family Medicine

## 2021-02-18 ENCOUNTER — Telehealth: Payer: Self-pay | Admitting: Cardiovascular Disease

## 2021-02-18 ENCOUNTER — Telehealth: Payer: Self-pay | Admitting: Family Medicine

## 2021-02-18 NOTE — Telephone Encounter (Signed)
Sejal called from [facility] to let provider know that with pt taking Mirtasapine and Duloxetine, there is a risk of serotonin syndrome.  Also states that with pt taking Plavix and Duloxetine, it could increase risk of bleeding.  They need confirmation that provider is aware. Can call at [phone]

## 2021-02-18 NOTE — Telephone Encounter (Signed)
Pt c/o medication issue:  1. Name of Medication: colchicine 0.6mg  half a tablet twice daily  2. How are you currently taking this medication (dosage and times per day)? half a tablet twice daily  3. Are you having a reaction (difficulty breathing--STAT)? no  4. What is your medication issue? pt is taking carvedilol and is also on colchicine and would like to make Dr. Allyson Sabal aware that there is a reaction between the two... pharmacy will reach out to the other prescriber to seek a lower dose.. please follow up if there is anything additional

## 2021-02-18 NOTE — Telephone Encounter (Signed)
Left message informing Sejal with [facility] pharmacy of [name]'s recommendations

## 2021-02-18 NOTE — Telephone Encounter (Addendum)
We do not have colchicine documented on pt's med list, PCP previously prescribed but discontinued a year ago on 02/14/20. Don't see documentation that pt should even be on colchicine, he should follow up with his PCP about this. If he does need to resume, he'll need further dose reduction of colchicine due to carvedilol drug interaction on top of baseline dose reduction he was already getting because of his CKD.

## 2021-02-18 NOTE — Telephone Encounter (Signed)
Please let her know that those are minor drug interactions that we can monitor for and watch for any symptoms but I do not think they are significant enough to discontinue medicines, if he wants to discuss this further than please have him schedule an appointment so we can talk about it further.

## 2021-02-18 NOTE — Telephone Encounter (Signed)
Noted Routed to pharmD to review

## 2021-02-18 NOTE — Telephone Encounter (Signed)
Spoke to pharmacist-made aware of recommendations and she will reach out to PCP

## 2021-03-11 ENCOUNTER — Other Ambulatory Visit: Payer: Self-pay | Admitting: Physician Assistant

## 2021-03-11 ENCOUNTER — Ambulatory Visit (INDEPENDENT_AMBULATORY_CARE_PROVIDER_SITE_OTHER): Payer: Medicare Other

## 2021-03-11 ENCOUNTER — Other Ambulatory Visit: Payer: Self-pay | Admitting: Family Medicine

## 2021-03-11 DIAGNOSIS — Z95810 Presence of automatic (implantable) cardiac defibrillator: Secondary | ICD-10-CM | POA: Diagnosis not present

## 2021-03-11 DIAGNOSIS — F32A Depression, unspecified: Secondary | ICD-10-CM

## 2021-03-11 DIAGNOSIS — F419 Anxiety disorder, unspecified: Secondary | ICD-10-CM

## 2021-03-11 DIAGNOSIS — I5042 Chronic combined systolic (congestive) and diastolic (congestive) heart failure: Secondary | ICD-10-CM | POA: Diagnosis not present

## 2021-03-15 ENCOUNTER — Telehealth: Payer: Self-pay

## 2021-03-15 NOTE — Progress Notes (Signed)
EPIC Encounter for ICM Monitoring  Patient Name: Douglas Carr is a 55 y.o. male Date: 03/15/2021 Primary Care Physican: [name], [name], MD Primary Cardiologist: Allyson Sabal Electrophysiologist: Elberta Fortis Last Office Weight: 198 lbs                                                          Attempted call to patient and unable to reach.  Left detailed message per DPR regarding transmission. Transmission reviewed.    Optivol thoracic impedance normal    Prescribed: Furosemide 40 mg One tablet daily as needed for weight gain 3 lb in one day or 5 lb in 1 week or for swelling   Labs: 11/08/2020 Creatinine 1.89, BUN 39, Potassium 5.2, Sodium 137, GFR 08/09/2020 Creatinine 1.42, BUN 26, Potassium 5.0, Sodium 138, GFR 59 08/06/2020 Creatinine 1.42, BUN 29, Potassium 6.1, Sodium 139, GFR 59  A complete set of results can be found in Results Review.   Recommendations:  Left voice mail with ICM number and encouraged to call if experiencing any fluid symptoms.   Follow-up plan: ICM clinic phone appointment on 04/15/2021.   91 day device clinic remote transmission 04/09/2021.     EP/Cardiology Office Visits:  None and no recalls   Copy of ICM check sent to Dr. Elberta Fortis.      3 month ICM trend: 03/11/2021.    1 Year ICM trend:       Karie Soda, RN 03/15/2021 11:57 AM

## 2021-03-15 NOTE — Telephone Encounter (Signed)
Remote ICM transmission received.  Attempted call to patient regarding ICM remote transmission and left detailed message per DPR.  Advised to return call for any fluid symptoms or questions. Next ICM remote transmission scheduled 04/15/2021.    

## 2021-04-09 ENCOUNTER — Ambulatory Visit (INDEPENDENT_AMBULATORY_CARE_PROVIDER_SITE_OTHER): Payer: Medicare Other

## 2021-04-09 DIAGNOSIS — I255 Ischemic cardiomyopathy: Secondary | ICD-10-CM

## 2021-04-09 LAB — CUP PACEART REMOTE DEVICE CHECK
Battery Remaining Longevity: 93 mo
Battery Voltage: 3 V
Brady Statistic RV Percent Paced: 0 %
Date Time Interrogation Session: 20221206044223
HighPow Impedance: 65 Ohm
Implantable Lead Implant Date: 20180226
Implantable Lead Location: 753860
Implantable Pulse Generator Implant Date: 20180226
Lead Channel Impedance Value: 304 Ohm
Lead Channel Impedance Value: 399 Ohm
Lead Channel Pacing Threshold Amplitude: 0.875 V
Lead Channel Pacing Threshold Pulse Width: 0.4 ms
Lead Channel Sensing Intrinsic Amplitude: 13.625 mV
Lead Channel Sensing Intrinsic Amplitude: 13.625 mV
Lead Channel Setting Pacing Amplitude: 2.5 V
Lead Channel Setting Pacing Pulse Width: 0.4 ms
Lead Channel Setting Sensing Sensitivity: 0.3 mV

## 2021-04-10 ENCOUNTER — Other Ambulatory Visit: Payer: Self-pay

## 2021-04-10 ENCOUNTER — Ambulatory Visit (INDEPENDENT_AMBULATORY_CARE_PROVIDER_SITE_OTHER): Payer: Medicare Other | Admitting: Family Medicine

## 2021-04-10 ENCOUNTER — Ambulatory Visit (INDEPENDENT_AMBULATORY_CARE_PROVIDER_SITE_OTHER): Payer: Medicare Other

## 2021-04-10 ENCOUNTER — Encounter: Payer: Self-pay | Admitting: Family Medicine

## 2021-04-10 VITALS — BP 167/87 | HR 91 | Temp 98.0°F | Ht 70.0 in | Wt 196.0 lb

## 2021-04-10 DIAGNOSIS — R0603 Acute respiratory distress: Secondary | ICD-10-CM

## 2021-04-10 DIAGNOSIS — J189 Pneumonia, unspecified organism: Secondary | ICD-10-CM

## 2021-04-10 DIAGNOSIS — R059 Cough, unspecified: Secondary | ICD-10-CM | POA: Diagnosis not present

## 2021-04-10 MED ORDER — AMOXICILLIN-POT CLAVULANATE 875-125 MG PO TABS: 1.0000 | ORAL_TABLET | Freq: Two times a day (BID) | ORAL | 0 refills | Status: AC

## 2021-04-10 MED ORDER — ALBUTEROL SULFATE HFA 108 (90 BASE) MCG/ACT IN AERS: 2.0000 | INHALATION_SPRAY | Freq: Four times a day (QID) | RESPIRATORY_TRACT | 2 refills | Status: DC | PRN

## 2021-04-10 MED ORDER — AZITHROMYCIN 250 MG PO TABS: ORAL_TABLET | ORAL | 0 refills | Status: DC

## 2021-04-10 NOTE — Progress Notes (Signed)
Subjective:  Patient ID: Douglas Carr, male    DOB: 06/08/1965, 55 y.o.   MRN: [id number]  Patient Care Team: [name], Fransisca Kaufmann, MD as PCP - General (Family Medicine) Lorretta Harp, MD as PCP - Cardiology (Cardiology) Constance Haw, MD as PCP - Electrophysiology (Cardiology)   Chief Complaint:  Shortness of Breath   HPI: Douglas Carr is a 55 y.o. male presenting on 04/10/2021 for Shortness of Breath   Pt presents today with worsening cough and congestion over the last 2 weeks. States he has increased fatigue, rust colored sputum protection, shortness of breath, and chest pain with deep breathing and coughing. He has not taken anything for his symptoms.   Shortness of Breath This is a new problem. The current episode started 1 to 4 weeks ago. The problem occurs constantly. The problem has been gradually worsening. Associated symptoms include chest pain, rhinorrhea, sputum production and wheezing. Pertinent negatives include no abdominal pain, claudication, coryza, ear pain, fever, headaches, hemoptysis, leg swelling, neck pain, orthopnea, PND, rash, sore throat, swollen glands, syncope or vomiting. He has tried nothing for the symptoms.  Cough This is a new problem. The current episode started 1 to 4 weeks ago. The problem has been gradually worsening. The problem occurs constantly. The cough is Productive of brown sputum. Associated symptoms include chest pain, chills, postnasal drip, rhinorrhea, shortness of breath and wheezing. Pertinent negatives include no ear congestion, ear pain, fever, headaches, heartburn, hemoptysis, myalgias, nasal congestion, rash, sore throat, sweats or weight loss.   Relevant past medical, surgical, family, and social history reviewed and updated as indicated.  Allergies and medications reviewed and updated. Data reviewed: Chart in Epic.   Past Medical History:  Diagnosis Date   Acute ST elevation myocardial infarction (STEMI)  involving left circumflex coronary artery ([place]) 05/07/2016   PCI to Cx-OM   Anginal pain (HCC)    secondary to sm. 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 05/13/2016   Coronary artery disease involving native coronary artery of native heart with angina pectoris ([place]) 05/24/2016   Remote MI at 55 years of age, Last cath 2010-diffuse non-obstructive disease; last echo 06/16/08 -normal LV function, moderate concentric hypertrophy; nuc 08/2008 no ischemia;  medical therapy; STEMI May 07 2016 - PCI to Cx-OM   Diabetes mellitus    ON ORAL MEDICATION AND INSULIN   Dilated cardiomyopathy ([place]) 05/24/2016   EF 325-30% by Echo post STEMI (previously 30-35%)    Hyperlipidemia    Hypertension    Myocardial infarction ([place]) 1996   Post MI   Peripheral vascular disease ([place])    HAS LEFT CAROTID ARTERY STENOSIS   AND IS S/P RIGHT CAROTID ENDARTERECTOMY 2010 Last carotid dopplers 01/08/2012 wth patent endarterectomy site   Status post coronary artery stent placement     Past Surgical History:  Procedure Laterality Date   APPENDECTOMY     CARDIAC CATHETERIZATION     FEB 2010, significant branch vessel disease wth diag, marginal, PDA & PLA, nml. LV function   CARDIAC CATHETERIZATION N/A 05/07/2016   Procedure: Left Heart Cath and Coronary Angiography;  Surgeon: Peter M Martinique, MD;  Location: [facility];  Service: Cardiovascular;  Laterality: N/A;  CARDIAC CATHETERIZATION N/A 05/07/2016   Procedure: Coronary Stent Intervention;  Surgeon: Peter M Martinique, MD;  Location: [facility];  Service: Cardiovascular;  Laterality: N/A;   CARDIAC CATHETERIZATION N/A 05/24/2016   Procedure: Left Heart Cath and Coronary Angiography;  Surgeon: [name], MD;  Location: [facility];  Service: Cardiovascular;   Laterality: N/A;   CARDIAC CATHETERIZATION N/A 05/24/2016   Procedure: Coronary Stent Intervention;  Surgeon: [name], MD;  Location: [facility];  Service: Cardiovascular;  Laterality: N/A;  2.5x20 Promus to Ostial/proximal circumflex   CAROTID ENDARTERECTOMY  09/2008   Rt CEA   CLIPPING OF ATRIAL APPENDAGE Left 01/27/2020   Procedure: CLIPPING OF ATRIAL APPENDAGE USING ATRICURE 66 MM ATRICLIP;  Surgeon: Wonda Olds, MD;  Location: [facility];  Service: Open Heart Surgery;  Laterality: Left;   CORONARY ARTERY BYPASS GRAFT N/A 01/27/2020   Procedure: CORONARY ARTERY BYPASS GRAFTING (CABG), ON PUMP, TIMES FIVE, USING BILATERAL MAMMARY ARTERIES, LEFT RADIAL ARTERY, AND ENDOSCOPICALLY HARVESTED RIGHT GREATER SAPHENOUS VEIN.;  Surgeon: Wonda Olds, MD;  Location: [facility];  Service: Open Heart Surgery;  Laterality: N/A;  LIMA->LAD RIMA->D1 (free graft) LRA-> OM SVG-> PDA->PL   ENDOVEIN HARVEST OF GREATER SAPHENOUS VEIN Right 01/27/2020   Procedure: ENDOVEIN HARVEST OF GREATER SAPHENOUS VEIN;  Surgeon: Wonda Olds, MD;  Location: [facility];  Service: Open Heart Surgery;  Laterality: Right;   ICD IMPLANT N/A 06/30/2016   Procedure: ICD Implant;  Surgeon: Will Meredith Leeds, MD;  Location: [facility];  Service: Cardiovascular;  Laterality: N/A;   INTRAVASCULAR PRESSURE WIRE/FFR STUDY N/A 01/24/2020   Procedure: INTRAVASCULAR PRESSURE WIRE/FFR STUDY;  Surgeon: Belva Crome, MD;  Location: [facility];  Service: Cardiovascular;  Laterality: N/A;   LUNG BIOPSY  2013   Bx's suggest granulomatous dz.   RADIAL ARTERY HARVEST Left 01/27/2020   Procedure: RADIAL ARTERY HARVEST;  Surgeon: Wonda Olds, MD;  Location: [facility];  Service: Open Heart Surgery;  Laterality: Left;   RIGHT/LEFT HEART CATH AND CORONARY ANGIOGRAPHY N/A 01/24/2020   Procedure: RIGHT/LEFT HEART CATH AND CORONARY ANGIOGRAPHY;  Surgeon: Belva Crome, MD;  Location: [facility];  Service:  Cardiovascular;  Laterality: N/A;   RT ANKLE   2013   TEE WITHOUT CARDIOVERSION N/A 01/27/2020   Procedure: TRANSESOPHAGEAL ECHOCARDIOGRAM (TEE);  Surgeon: Wonda Olds, MD;  Location: [facility];  Service: Open Heart Surgery;  Laterality: N/A;    Social History   Socioeconomic History   Marital status: Married    Spouse name: [name]   Number of children: 3   Years of education: Not on file   Highest education level: Not on file  Occupational History   Occupation: Agricultural consultant for [employer]    Employer: [employer]   Tobacco Use   Smoking status: Former    Packs/day: 2.00    Years: 22.00    Pack years: 44.00    Types: Cigarettes    Quit date: 10/23/2011    Years since quitting: 9.4   Smokeless tobacco: Current    Types: Snuff  Vaping Use   Vaping Use: Never used  Substance and Sexual Activity   Alcohol use: No   Drug use: No   Sexual activity: Not on file  Other Topics Concern   Not on file  Social History Narrative   Pt lives with family in [place], [place].   Social Determinants of Health   Financial Resource Strain: Not  on file  Food Insecurity: Not on file  Transportation Needs: Not on file  Physical Activity: Not on file  Stress: Not on file  Social Connections: Not on file  Intimate Partner Violence: Not on file    Outpatient Encounter Medications as of 04/10/2021  Medication Sig   albuterol (VENTOLIN HFA) 108 (90 Base) MCG/ACT inhaler Inhale 2 puffs into the lungs every 6 (six) hours as needed for wheezing or shortness of breath.   amoxicillin-clavulanate (AUGMENTIN) 875-125 MG tablet Take 1 tablet by mouth 2 (two) times daily for 10 days.   ASPIRIN LOW DOSE 81 MG EC tablet TAKE ONE TABLET BY MOUTH DAILY   atorvastatin (LIPITOR) 80 MG tablet Take 1 tablet (80 mg total) by mouth every evening.   azithromycin (ZITHROMAX Z-PAK) 250 MG tablet As directed   blood glucose meter kit and supplies KIT Inject 1 each into the skin 4 (four) times daily as needed. Dispense based on patient  and insurance preference. Use up to four times daily as directed. (FOR ICD-9 250.00, 250.01).   carvedilol (COREG) 6.25 MG tablet TAKE ONE TABLET BY MOUTH TWICE DAILY WITH MEALS   clopidogrel (PLAVIX) 75 MG tablet TAKE ONE TABLET BY MOUTH IN THE EVENING   DULoxetine (CYMBALTA) 60 MG capsule TAKE TWO CAPSULES BY MOUTH DAILY   ENTRESTO 24-26 MG TAKE ONE TABLET BY MOUTH TWICE DAILY   furosemide (LASIX) 40 MG tablet One tablet daily as needed for weight gain 3 lb in one day or 5 lb in 1 week or for swelling   glucose blood (ONETOUCH VERIO) test strip 1 each by Other route in the morning, at noon, in the evening, and at bedtime. use for testing   hydrOXYzine (ATARAX/VISTARIL) 25 MG tablet Take 1 tablet (25 mg total) by mouth 3 (three) times daily as needed. (NEEDS TO BE SEEN BEFORE NEXT REFILL)   Insulin Pen Needle (B-D ULTRAFINE III SHORT PEN) 31G X 8 MM MISC USE TO INJECT INSULIN DAILY DX E11.9   isosorbide dinitrate (ISORDIL) 10 MG tablet TAKE ONE TABLET BY MOUTH TWICE DAILY   mirtazapine (REMERON) 7.5 MG tablet Take 1 tablet (7.5 mg total) by mouth at bedtime. TAKE ONE TABLET BY MOUTH AT BEDTIME FOR DEPRESSION   OneTouch Delica Lancets 78L MISC Test blood sugars three times daily   primidone (MYSOLINE) 50 MG tablet TAKE ONE TABLET BY MOUTH AT BEDTIME   Semaglutide, 2 MG/DOSE, 8 MG/3ML SOPN Inject 2 mg as directed once a week.   tamsulosin (FLOMAX) 0.4 MG CAPS capsule TAKE 1 CAPSULE DAILY IN THE EVENING   topiramate (TOPAMAX) 50 MG tablet TAKE ONE TABLET BY MOUTH AT BEDTIME AS NEEDED   No facility-administered encounter medications on file as of 04/10/2021.    No Known Allergies  Review of Systems  Constitutional:  Positive for activity change, appetite change, chills and fatigue. Negative for diaphoresis, fever, unexpected weight change and weight loss.  HENT:  Positive for congestion, postnasal drip and rhinorrhea. Negative for ear pain and sore throat.   Respiratory:  Positive for cough,  sputum production, shortness of breath and wheezing. Negative for apnea, hemoptysis, choking, chest tightness and stridor.   Cardiovascular:  Positive for chest pain. Negative for orthopnea, claudication, leg swelling, syncope and PND.  Gastrointestinal:  Negative for abdominal pain, heartburn and vomiting.  Genitourinary:  Negative for decreased urine volume.  Musculoskeletal:  Negative for myalgias and neck pain.  Skin:  Negative for rash.  Neurological:  Negative for dizziness, tremors, seizures, syncope,  facial asymmetry, speech difficulty, light-headedness, numbness and headaches.  Psychiatric/Behavioral:  Negative for confusion.   All other systems reviewed and are negative.      Objective:  BP (!) 167/87   Pulse 91   Temp 98 F (36.7 C)   Ht 5' 10" (1.778 m)   Wt 196 lb (88.9 kg)   SpO2 99%   BMI 28.12 kg/m    Wt Readings from Last 3 Encounters:  04/10/21 196 lb (88.9 kg)  11/08/20 196 lb (88.9 kg)  09/26/20 198 lb 6.4 oz (90 kg)    Physical Exam Vitals and nursing note reviewed.  Constitutional:      General: He is not in acute distress.    Appearance: He is well-developed. He is ill-appearing. He is not toxic-appearing or diaphoretic.  HENT:     Head: Normocephalic and atraumatic.  Eyes:     Conjunctiva/sclera: Conjunctivae normal.     Pupils: Pupils are equal, round, and reactive to light.  Cardiovascular:     Rate and Rhythm: Normal rate and regular rhythm. No extrasystoles are present.    Heart sounds: Normal heart sounds. Heart sounds not distant. No murmur heard.   No friction rub. No gallop. No S3 sounds.  Pulmonary:     Breath sounds: Examination of the right-middle field reveals rhonchi. Examination of the right-lower field reveals rhonchi. Rhonchi present. No decreased breath sounds, wheezing or rales.  Chest:     Chest wall: No mass, lacerations, deformity, swelling, tenderness, crepitus or edema. There is no dullness to percussion.     Comments: Left  ICD pocket normal, sternotomy well healed Musculoskeletal:     Right lower leg: No edema.     Left lower leg: No edema.  Skin:    General: Skin is warm and dry.     Capillary Refill: Capillary refill takes less than 2 seconds.  Neurological:     Mental Status: He is alert and oriented to person, place, and time. Mental status is at baseline.     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     Motor: Weakness (slight RUE weakness from prior CVA) present.  Psychiatric:        Mood and Affect: Mood is anxious.        Behavior: Behavior is agitated.        Thought Content: Thought content normal.        Judgment: Judgment normal.    Results for orders placed or performed in visit on 04/09/21  CUP PACEART REMOTE DEVICE CHECK  Result Value Ref Range   Date Time Interrogation Session [id number]    Pulse Generator Manufacturer MERM    Pulse Gen Model [id number] Visia AF MRI VR    Pulse Gen Serial Number [id number]    Clinic Name [facility]    Implantable Pulse Generator Type Implantable Cardiac Defibulator    Implantable Pulse Generator Implant Date [id number]    Implantable Lead Manufacturer [facility]    Implantable Lead Model [id number] Sprint Quattro Secure S MRI SureScan    Implantable Lead Serial Number [id number]    Implantable Lead Implant Date [id number]    Implantable Lead Location Detail 1 [employer]PEX    Implantable Lead Location [id number]    Lead Channel Setting Sensing Sensitivity 0.3 mV   Lead Channel Setting Pacing Pulse Width 0.4 ms   Lead Channel Setting Pacing Amplitude 2.5 V   Lead Channel Impedance Value 399 ohm   Lead Channel Impedance Value 304 ohm   Lead Channel  Sensing Intrinsic Amplitude 13.625 mV   Lead Channel Sensing Intrinsic Amplitude 13.625 mV   Lead Channel Pacing Threshold Amplitude 0.875 V   Lead Channel Pacing Threshold Pulse Width 0.4 ms   HighPow Impedance 65 ohm   Battery Status OK    Battery Remaining Longevity 93 mo   Battery Voltage 3.00 V   Brady  Statistic RV Percent Paced 0 %     X-Ray: CXR: increased RLL consolidation concerning for CAP. Preliminary x-ray reading by Monia Pouch, FNP-C, [facility].   Pertinent labs & imaging results that were available during my care of the patient were reviewed by me and considered in my medical decision making.  Assessment & Plan:  Alberta was seen today for shortness of breath.  Diagnoses and all orders for this visit:  Dyspnea CAP RLL Increased consolidation noted in RLL. Will treat for CAP with below. Pt and family aware of symptoms which require emergent evaluation and treatment. Follow up in office in 7-10 days for reevaluation.  -     DG Chest 2 View; Future -     albuterol (VENTOLIN HFA) 108 (90 Base) MCG/ACT inhaler; Inhale 2 puffs into the lungs every 6 (six) hours as needed for wheezing or shortness of breath. -     azithromycin (ZITHROMAX Z-PAK) 250 MG tablet; As directed -     amoxicillin-clavulanate (AUGMENTIN) 875-125 MG tablet; Take 1 tablet by mouth 2 (two) times daily for 10 days.    Continue all other maintenance medications.  Follow up plan: Return in about 2 weeks (around 04/24/2021), or if symptoms worsen or fail to improve, for PNA.   Continue healthy lifestyle choices, including diet (rich in fruits, vegetables, and lean proteins, and low in salt and simple carbohydrates) and exercise (at least 30 minutes of moderate physical activity daily).  Educational handout given for pneumonia  The above assessment and management plan was discussed with the patient. The patient verbalized understanding of and has agreed to the management plan. Patient is aware to call the clinic if they develop any new symptoms or if symptoms persist or worsen. Patient is aware when to return to the clinic for a follow-up visit. Patient educated on when it is appropriate to go to the emergency department.   Monia Pouch, FNP-C [facility] [phone]

## 2021-04-13 ENCOUNTER — Other Ambulatory Visit: Payer: Self-pay | Admitting: Family Medicine

## 2021-04-13 DIAGNOSIS — F419 Anxiety disorder, unspecified: Secondary | ICD-10-CM

## 2021-04-15 ENCOUNTER — Ambulatory Visit (INDEPENDENT_AMBULATORY_CARE_PROVIDER_SITE_OTHER): Payer: Medicare Other

## 2021-04-15 DIAGNOSIS — Z95810 Presence of automatic (implantable) cardiac defibrillator: Secondary | ICD-10-CM

## 2021-04-15 DIAGNOSIS — I5042 Chronic combined systolic (congestive) and diastolic (congestive) heart failure: Secondary | ICD-10-CM | POA: Diagnosis not present

## 2021-04-18 NOTE — Progress Notes (Signed)
Remote ICD transmission.   

## 2021-04-19 NOTE — Progress Notes (Signed)
EPIC Encounter for ICM Monitoring  Patient Name: Douglas Carr is a 55 y.o. male Date: 04/19/2021 Primary Care Physican: [name], [name], MD Primary Cardiologist: Allyson Sabal Electrophysiologist: Elberta Fortis Last Office Weight: 198 lbs                                                          Transmission reviewed.    Optivol thoracic impedance normal    Prescribed: Furosemide 40 mg One tablet daily as needed for weight gain 3 lb in one day or 5 lb in 1 week or for swelling   Labs: 11/08/2020 Creatinine 1.89, BUN 39, Potassium 5.2, Sodium 137, GFR 08/09/2020 Creatinine 1.42, BUN 26, Potassium 5.0, Sodium 138, GFR 59 08/06/2020 Creatinine 1.42, BUN 29, Potassium 6.1, Sodium 139, GFR 59  A complete set of results can be found in Results Review.   Recommendations:  No changes   Follow-up plan: ICM clinic phone appointment on 05/20/2021.   91 day device clinic remote transmission 07/09/2021     EP/Cardiology Office Visits:  None and no recalls.  Last visit with Dr Elberta Fortis 2018.  Message sent to EP scheduler to call patient for appointment.    Copy of ICM check sent to Dr. Elberta Fortis.    3 month ICM trend: 04/15/2021.    12-14 Month ICM trend:       Karie Soda, RN 04/19/2021 4:39 PM

## 2021-04-23 ENCOUNTER — Encounter: Payer: Self-pay | Admitting: Family Medicine

## 2021-04-23 ENCOUNTER — Ambulatory Visit: Payer: Medicare Other | Admitting: Family Medicine

## 2021-04-24 ENCOUNTER — Ambulatory Visit (INDEPENDENT_AMBULATORY_CARE_PROVIDER_SITE_OTHER): Payer: Medicare Other | Admitting: Family Medicine

## 2021-04-24 ENCOUNTER — Encounter: Payer: Self-pay | Admitting: Family Medicine

## 2021-04-24 VITALS — BP 138/75 | HR 91 | Temp 97.3°F | Ht 70.0 in | Wt 197.6 lb

## 2021-04-24 DIAGNOSIS — J189 Pneumonia, unspecified organism: Secondary | ICD-10-CM

## 2021-04-24 NOTE — Progress Notes (Signed)
Subjective:  Patient ID: Douglas Carr, male    DOB: Jan 27, 1966, 55 y.o.   MRN: [id number]  Patient Care Team: [name], Fransisca Kaufmann, MD as PCP - General (Family Medicine) Lorretta Harp, MD as PCP - Cardiology (Cardiology) Constance Haw, MD as PCP - Electrophysiology (Cardiology)   Chief Complaint:  Pneumonia   HPI: Douglas Carr is a 55 y.o. male presenting on 04/24/2021 for Pneumonia   Patient presents today for follow-up after treatment of CAP.  Patient has completed antibiotic regimen.  States he is doing much better, no residual symptoms.  Pneumonia There is no chest tightness, cough, difficulty breathing, frequent throat clearing, hemoptysis, hoarse voice, shortness of breath, sputum production or wheezing. Pertinent negatives include no appetite change, fever or headaches. His symptoms are alleviated by beta-agonist (Antibiotics). He reports complete improvement on treatment.     Relevant past medical, surgical, family, and social history reviewed and updated as indicated.  Allergies and medications reviewed and updated. Data reviewed: Chart in Epic.   Past Medical History:  Diagnosis Date   Acute ST elevation myocardial infarction (STEMI) involving left circumflex coronary artery ([place]) 05/07/2016   PCI to Cx-OM   Anginal pain (HCC)    secondary to sm. 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 05/13/2016   Coronary artery disease involving native coronary artery of native heart with angina pectoris ([place]) 05/24/2016   Remote MI at 55 years of age, Last cath 2010-diffuse non-obstructive disease; last echo 06/16/08 -normal LV function, moderate concentric hypertrophy; nuc 08/2008 no ischemia;  medical therapy; STEMI May 07 2016 - PCI to Cx-OM   Diabetes mellitus    ON ORAL  MEDICATION AND INSULIN   Dilated cardiomyopathy ([place]) 05/24/2016   EF 325-30% by Echo post STEMI (previously 30-35%)    Hyperlipidemia    Hypertension    Myocardial infarction ([place]) 1996   Post MI   Peripheral vascular disease ([place])    HAS LEFT CAROTID ARTERY STENOSIS   AND IS S/P RIGHT CAROTID ENDARTERECTOMY 2010 Last carotid dopplers 01/08/2012 wth patent endarterectomy site   Status post coronary artery stent placement     Past Surgical History:  Procedure Laterality Date   APPENDECTOMY     CARDIAC CATHETERIZATION     FEB 2010, significant branch vessel disease wth diag, marginal, PDA & PLA, nml. LV function   CARDIAC CATHETERIZATION N/A 05/07/2016   Procedure: Left Heart Cath and Coronary Angiography;  Surgeon: Peter M Martinique, MD;  Location: [facility];  Service: Cardiovascular;  Laterality: N/A;   CARDIAC CATHETERIZATION N/A 05/07/2016   Procedure: Coronary Stent Intervention;  Surgeon: Peter M Martinique, MD;  Location: [facility];  Service: Cardiovascular;  Laterality: N/A;   CARDIAC CATHETERIZATION N/A 05/24/2016   Procedure: Left Heart Cath and Coronary Angiography;  Surgeon: [name], MD;  Location: [facility];  Service: Cardiovascular;  Laterality: N/A;   CARDIAC CATHETERIZATION N/A 05/24/2016   Procedure: Coronary Stent Intervention;  Surgeon: [name], MD;  Location: [facility];  Service: Cardiovascular;  Laterality: N/A;  2.5x20 Promus to Ostial/proximal circumflex   CAROTID ENDARTERECTOMY  09/2008   Rt CEA   CLIPPING OF ATRIAL APPENDAGE Left 01/27/2020  Procedure: CLIPPING OF ATRIAL APPENDAGE USING ATRICURE 62 MM ATRICLIP;  Surgeon: Wonda Olds, MD;  Location: [facility];  Service: Open Heart Surgery;  Laterality: Left;   CORONARY ARTERY BYPASS GRAFT N/A 01/27/2020   Procedure: CORONARY ARTERY BYPASS GRAFTING (CABG), ON PUMP, TIMES FIVE, USING BILATERAL MAMMARY ARTERIES, LEFT RADIAL ARTERY, AND ENDOSCOPICALLY HARVESTED RIGHT GREATER  SAPHENOUS VEIN.;  Surgeon: Wonda Olds, MD;  Location: [facility];  Service: Open Heart Surgery;  Laterality: N/A;  LIMA->LAD RIMA->D1 (free graft) LRA-> OM SVG-> PDA->PL   ENDOVEIN HARVEST OF GREATER SAPHENOUS VEIN Right 01/27/2020   Procedure: ENDOVEIN HARVEST OF GREATER SAPHENOUS VEIN;  Surgeon: Wonda Olds, MD;  Location: [facility];  Service: Open Heart Surgery;  Laterality: Right;   ICD IMPLANT N/A 06/30/2016   Procedure: ICD Implant;  Surgeon: Will Meredith Leeds, MD;  Location: [facility];  Service: Cardiovascular;  Laterality: N/A;   INTRAVASCULAR PRESSURE WIRE/FFR STUDY N/A 01/24/2020   Procedure: INTRAVASCULAR PRESSURE WIRE/FFR STUDY;  Surgeon: Belva Crome, MD;  Location: [facility];  Service: Cardiovascular;  Laterality: N/A;   LUNG BIOPSY  2013   Bx's suggest granulomatous dz.   RADIAL ARTERY HARVEST Left 01/27/2020   Procedure: RADIAL ARTERY HARVEST;  Surgeon: Wonda Olds, MD;  Location: [facility];  Service: Open Heart Surgery;  Laterality: Left;   RIGHT/LEFT HEART CATH AND CORONARY ANGIOGRAPHY N/A 01/24/2020   Procedure: RIGHT/LEFT HEART CATH AND CORONARY ANGIOGRAPHY;  Surgeon: Belva Crome, MD;  Location: [facility];  Service: Cardiovascular;  Laterality: N/A;   RT ANKLE   2013   TEE WITHOUT CARDIOVERSION N/A 01/27/2020   Procedure: TRANSESOPHAGEAL ECHOCARDIOGRAM (TEE);  Surgeon: Wonda Olds, MD;  Location: [facility];  Service: Open Heart Surgery;  Laterality: N/A;    Social History   Socioeconomic History   Marital status: Married    Spouse name: [name]   Number of children: 3   Years of education: Not on file   Highest education level: Not on file  Occupational History   Occupation: Agricultural consultant for [employer]    Employer: [employer]   Tobacco Use   Smoking status: Former    Packs/day: 2.00    Years: 22.00    Pack years: 44.00    Types: Cigarettes    Quit date: 10/23/2011    Years since quitting: 9.5   Smokeless tobacco: Current    Types: Snuff   Vaping Use   Vaping Use: Never used  Substance and Sexual Activity   Alcohol use: No   Drug use: No   Sexual activity: Not on file  Other Topics Concern   Not on file  Social History Narrative   Pt lives with family in [place], [place].   Social Determinants of Health   Financial Resource Strain: Not on file  Food Insecurity: Not on file  Transportation Needs: Not on file  Physical Activity: Not on file  Stress: Not on file  Social Connections: Not on file  Intimate Partner Violence: Not on file    Outpatient Encounter Medications as of 04/24/2021  Medication Sig   albuterol (VENTOLIN HFA) 108 (90 Base) MCG/ACT inhaler Inhale 2 puffs into the lungs every 6 (six) hours as needed for wheezing or shortness of breath.   ASPIRIN LOW DOSE 81 MG EC tablet TAKE ONE TABLET BY MOUTH DAILY   atorvastatin (LIPITOR) 80 MG tablet Take 1 tablet (80 mg total) by mouth every evening.   azithromycin (ZITHROMAX Z-PAK) 250 MG  tablet As directed   blood glucose meter kit and supplies KIT Inject 1 each into the skin 4 (four) times daily as needed. Dispense based on patient and insurance preference. Use up to four times daily as directed. (FOR ICD-9 250.00, 250.01).   carvedilol (COREG) 6.25 MG tablet TAKE ONE TABLET BY MOUTH TWICE DAILY WITH MEALS   clopidogrel (PLAVIX) 75 MG tablet TAKE ONE TABLET BY MOUTH IN THE EVENING   DULoxetine (CYMBALTA) 60 MG capsule TAKE TWO CAPSULES BY MOUTH DAILY   ENTRESTO 24-26 MG TAKE ONE TABLET BY MOUTH TWICE DAILY   furosemide (LASIX) 40 MG tablet One tablet daily as needed for weight gain 3 lb in one day or 5 lb in 1 week or for swelling   glucose blood (ONETOUCH VERIO) test strip 1 each by Other route in the morning, at noon, in the evening, and at bedtime. use for testing   hydrOXYzine (ATARAX) 25 MG tablet Take 1 tablet (25 mg total) by mouth 3 (three) times daily as needed.   Insulin Pen Needle (B-D ULTRAFINE III SHORT PEN) 31G X 8 MM MISC USE TO INJECT INSULIN DAILY  DX E11.9   isosorbide dinitrate (ISORDIL) 10 MG tablet TAKE ONE TABLET BY MOUTH TWICE DAILY   mirtazapine (REMERON) 7.5 MG tablet Take 1 tablet (7.5 mg total) by mouth at bedtime. TAKE ONE TABLET BY MOUTH AT BEDTIME FOR DEPRESSION   OneTouch Delica Lancets 24M MISC Test blood sugars three times daily   primidone (MYSOLINE) 50 MG tablet TAKE ONE TABLET BY MOUTH AT BEDTIME   Semaglutide, 2 MG/DOSE, 8 MG/3ML SOPN Inject 2 mg as directed once a week.   tamsulosin (FLOMAX) 0.4 MG CAPS capsule TAKE 1 CAPSULE DAILY IN THE EVENING   topiramate (TOPAMAX) 50 MG tablet TAKE ONE TABLET BY MOUTH AT BEDTIME AS NEEDED   No facility-administered encounter medications on file as of 04/24/2021.    No Known Allergies  Review of Systems  Constitutional:  Negative for activity change, appetite change, chills, diaphoresis, fatigue, fever and unexpected weight change.  HENT:  Negative for congestion and hoarse voice.   Respiratory:  Negative for apnea, cough, hemoptysis, sputum production, choking, chest tightness, shortness of breath, wheezing and stridor.   Genitourinary:  Negative for decreased urine volume.  Neurological:  Negative for headaches.  Psychiatric/Behavioral:  Negative for confusion.        Objective:  BP 138/75    Pulse 91    Temp (!) 97.3 F (36.3 C)    Ht 5' 10"  (1.778 m)    Wt 197 lb 9.6 oz (89.6 kg)    SpO2 99%    BMI 28.35 kg/m    Wt Readings from Last 3 Encounters:  04/24/21 197 lb 9.6 oz (89.6 kg)  04/10/21 196 lb (88.9 kg)  11/08/20 196 lb (88.9 kg)    Physical Exam Vitals and nursing note reviewed.  Constitutional:      General: He is not in acute distress.    Appearance: Normal appearance. He is not ill-appearing, toxic-appearing or diaphoretic.  HENT:     Head: Normocephalic and atraumatic.     Right Ear: Tympanic membrane, ear canal and external ear normal.     Left Ear: Tympanic membrane, ear canal and external ear normal.     Mouth/Throat:     Mouth: Mucous  membranes are moist.     Pharynx: Oropharynx is clear.  Eyes:     Conjunctiva/sclera: Conjunctivae normal.     Pupils: Pupils are equal,  round, and reactive to light.  Cardiovascular:     Rate and Rhythm: Normal rate and regular rhythm.     Heart sounds: Normal heart sounds. No murmur heard.   No friction rub. No gallop.  Pulmonary:     Effort: Pulmonary effort is normal. No respiratory distress.     Breath sounds: Normal breath sounds. No wheezing, rhonchi or rales.  Chest:     Chest wall: No tenderness.  Skin:    General: Skin is warm and dry.     Capillary Refill: Capillary refill takes less than 2 seconds.  Neurological:     Mental Status: He is alert. Mental status is at baseline.     Comments: Dysarthria and slight right upper extremity weakness from prior CVA  Psychiatric:        Mood and Affect: Mood normal.        Behavior: Behavior normal.        Thought Content: Thought content normal.        Judgment: Judgment normal.    Results for orders placed or performed in visit on 04/09/21  CUP PACEART REMOTE DEVICE CHECK  Result Value Ref Range   Date Time Interrogation Session [id number]    Pulse Generator Manufacturer MERM    Pulse Gen Model [id number] Visia AF MRI VR    Pulse Gen Serial Number [id number]    Clinic Name [facility]    Implantable Pulse Generator Type Implantable Cardiac Defibulator    Implantable Pulse Generator Implant Date [id number]    Implantable Lead Manufacturer [facility]    Implantable Lead Model [id number] Sprint Quattro Secure S MRI SureScan    Implantable Lead Serial Number [id number]    Implantable Lead Implant Date 89169450    Implantable Lead Location Detail 1 APEX    Implantable Lead Location [id number]    Lead Channel Setting Sensing Sensitivity 0.3 mV   Lead Channel Setting Pacing Pulse Width 0.4 ms   Lead Channel Setting Pacing Amplitude 2.5 V   Lead Channel Impedance Value 399 ohm   Lead Channel Impedance Value 304 ohm   Lead Channel Sensing  Intrinsic Amplitude 13.625 mV   Lead Channel Sensing Intrinsic Amplitude 13.625 mV   Lead Channel Pacing Threshold Amplitude 0.875 V   Lead Channel Pacing Threshold Pulse Width 0.4 ms   HighPow Impedance 65 ohm   Battery Status OK    Battery Remaining Longevity 93 mo   Battery Voltage 3.00 V   Brady Statistic RV Percent Paced 0 %       Pertinent labs & imaging results that were available during my care of the patient were reviewed by me and considered in my medical decision making.  Assessment & Plan:  Douglas Carr was seen today for pneumonia.  Diagnoses and all orders for this visit:  Community acquired pneumonia of right lower lobe of lung Patient looks much better than office today.  Denies any respiratory symptoms.  States he is feeling much better.  Patient aware to report any return of symptoms.    Continue all other maintenance medications.  Follow up plan: Return if symptoms worsen or fail to improve.   Continue healthy lifestyle choices, including diet (rich in fruits, vegetables, and lean proteins, and low in salt and simple carbohydrates) and exercise (at least 30 minutes of moderate physical activity daily).   The above assessment and management plan was discussed with the patient. The patient verbalized understanding of and has agreed to the management plan. Patient is aware to call  the clinic if they develop any new symptoms or if symptoms persist or worsen. Patient is aware when to return to the clinic for a follow-up visit. Patient educated on when it is appropriate to go to the emergency department.   Monia Pouch, FNP-C [facility] [phone]

## 2021-05-13 ENCOUNTER — Telehealth: Payer: Self-pay | Admitting: Family Medicine

## 2021-05-13 DIAGNOSIS — F688 Other specified disorders of adult personality and behavior: Secondary | ICD-10-CM

## 2021-05-13 DIAGNOSIS — R4689 Other symptoms and signs involving appearance and behavior: Secondary | ICD-10-CM

## 2021-05-13 NOTE — Telephone Encounter (Signed)
Severe change in personality. Getting worse. Started about 6-7 months ago.  Aggressive  Cardiac Arrest 5 years ago  Hx of stroke  Should he see neurology- seen Tat in past

## 2021-05-13 NOTE — Telephone Encounter (Signed)
Yes I think would be good to see psychiatry and possibly come in here and possibly see neurology as well

## 2021-05-14 NOTE — Telephone Encounter (Signed)
I spoke with pt's wife and advised her of provider feedback. She said she will try to schedule an appt with psych, doesn't think they need referral for that and then will discuss neuro referral when he has his follow up 06/03/21 with [name] to see if Tat is the best option or if they should possibly see another Neurologist.

## 2021-05-15 NOTE — Telephone Encounter (Signed)
Will close encounter

## 2021-05-17 ENCOUNTER — Other Ambulatory Visit: Payer: Self-pay | Admitting: Family Medicine

## 2021-05-17 ENCOUNTER — Encounter: Payer: Self-pay | Admitting: Nurse Practitioner

## 2021-05-17 ENCOUNTER — Ambulatory Visit: Payer: Medicare PPO | Admitting: Nurse Practitioner

## 2021-05-17 VITALS — BP 110/72 | HR 70 | Temp 98.1°F | Resp 20 | Ht 70.0 in | Wt 197.0 lb

## 2021-05-17 DIAGNOSIS — J069 Acute upper respiratory infection, unspecified: Secondary | ICD-10-CM | POA: Diagnosis not present

## 2021-05-17 DIAGNOSIS — F32A Depression, unspecified: Secondary | ICD-10-CM

## 2021-05-17 DIAGNOSIS — F419 Anxiety disorder, unspecified: Secondary | ICD-10-CM

## 2021-05-17 MED ORDER — HYDROCODONE BIT-HOMATROP MBR 5-1.5 MG/5ML PO SOLN: 5.0000 mL | Freq: Four times a day (QID) | ORAL | 0 refills | Status: DC | PRN

## 2021-05-17 MED ORDER — LEVOFLOXACIN 500 MG PO TABS: 500.0000 mg | ORAL_TABLET | Freq: Every day | ORAL | 0 refills | Status: DC

## 2021-05-17 NOTE — Patient Instructions (Signed)
1. Take meds as prescribed 2. Use a cool mist humidifier especially during the winter months and when heat has been humid. 3. Use saline nose sprays frequently 4. Saline irrigations of the nose can be very helpful if done frequently.  * 4X daily for 1 week*  * Use of a nettie pot can be helpful with this. Follow directions with this* 5. Drink plenty of fluids 6. Keep thermostat turn down low 7.For any cough or congestion- hycodan 8. For fever or aces or pains- take tylenol or ibuprofen appropriate for age and weight.  * for fevers greater than 101 orally you may alternate ibuprofen and tylenol every  3 hours.    

## 2021-05-17 NOTE — Progress Notes (Signed)
Subjective:    Patient ID: EIDAN MUELLNER, male    DOB: 1965/09/01, 56 y.o.   MRN: [id number]  Chief Complaint: uri   URI  This is a recurrent problem. The current episode started in the past 7 days. The problem has been gradually worsening. Maximum temperature: ? Associated symptoms include congestion, coughing and rhinorrhea. Pertinent negatives include no sore throat. He has tried decongestant for the symptoms. The treatment provided mild relief.  He had pneumonia at the end of December and got better then started get sick agin in the last few weeks.     Review of Systems  HENT:  Positive for congestion and rhinorrhea. Negative for sore throat.   Respiratory:  Positive for cough.       Objective:   Physical Exam Vitals reviewed.  Constitutional:      Appearance: Normal appearance.  Cardiovascular:     Rate and Rhythm: Normal rate and regular rhythm.     Heart sounds: Normal heart sounds.  Pulmonary:     Effort: Pulmonary effort is normal.     Breath sounds: Rhonchi present.     Comments: Deep cough Raspy voice Skin:    General: Skin is warm.  Neurological:     General: No focal deficit present.     Mental Status: He is alert and oriented to person, place, and time.  Psychiatric:        Mood and Affect: Mood normal.    BP 110/72    Pulse 70    Temp 98.1 F (36.7 C) (Temporal)    Resp 20    Ht 5\' 10"  (1.778 m)    Wt 197 lb (89.4 kg)    SpO2 97%    BMI 28.27 kg/m         Assessment & Plan:  LUCUS LAMBERTSON in today with chief complaint of Cough, Nasal Congestion, and Fever   1. URI with cough and congestion 1. Take meds as prescribed 2. Use a cool mist humidifier especially during the winter months and when heat has been humid. 3. Use saline nose sprays frequently 4. Saline irrigations of the nose can be very helpful if done frequently.  * 4X daily for 1 week*  * Use of a nettie pot can be helpful with this. Follow directions with this* 5. Drink  plenty of fluids 6. Keep thermostat turn down low 7.For any cough or congestion- hycodan 8. For fever or aces or pains- take tylenol or ibuprofen appropriate for age and weight.  * for fevers greater than 101 orally you may alternate ibuprofen and tylenol every  3 hours.   Meds ordered this encounter  Medications   levofloxacin (LEVAQUIN) 500 MG tablet    Sig: Take 1 tablet (500 mg total) by mouth daily.    Dispense:  7 tablet    Refill:  0    Order Specific Question:   Supervising Provider    Answer:   07-21-1985 A [[id number]]   HYDROcodone bit-homatropine (HYCODAN) 5-1.5 MG/5ML syrup    Sig: Take 5 mLs by mouth every 6 (six) hours as needed for cough.    Dispense:  120 mL    Refill:  0    Order Specific Question:   Supervising Provider    Answer:   Arville Care A [[id number]]       The above assessment and management plan was discussed with the patient. The patient verbalized understanding of and has agreed to the management plan. Patient is  aware to call the clinic if symptoms persist or worsen. Patient is aware when to return to the clinic for a follow-up visit. Patient educated on when it is appropriate to go to the emergency department.   Mary-Margaret Daphine Deutscher, FNP

## 2021-05-20 ENCOUNTER — Ambulatory Visit (INDEPENDENT_AMBULATORY_CARE_PROVIDER_SITE_OTHER): Payer: Medicare Other

## 2021-05-20 DIAGNOSIS — Z95810 Presence of automatic (implantable) cardiac defibrillator: Secondary | ICD-10-CM

## 2021-05-20 DIAGNOSIS — I5042 Chronic combined systolic (congestive) and diastolic (congestive) heart failure: Secondary | ICD-10-CM

## 2021-05-22 ENCOUNTER — Telehealth: Payer: Self-pay | Admitting: Family Medicine

## 2021-05-22 ENCOUNTER — Other Ambulatory Visit: Payer: Self-pay | Admitting: Family Medicine

## 2021-05-22 DIAGNOSIS — F419 Anxiety disorder, unspecified: Secondary | ICD-10-CM

## 2021-05-22 DIAGNOSIS — F32A Depression, unspecified: Secondary | ICD-10-CM

## 2021-05-22 NOTE — Telephone Encounter (Signed)
Patient aware and states that MMM had told him that an xray would not be beneficial at this time when they had their visit.  States that he is still coughing and running a low grade fever.  Would like to know what MMM would like to do?

## 2021-05-22 NOTE — Telephone Encounter (Signed)
Patient was seen 05/17/2021 MMM gave Levaquin and hycodan. Please review and advise

## 2021-05-22 NOTE — Telephone Encounter (Signed)
PT R/C 

## 2021-05-22 NOTE — Telephone Encounter (Signed)
Lmtcb.

## 2021-05-22 NOTE — Telephone Encounter (Signed)
Busy x3

## 2021-05-22 NOTE — Telephone Encounter (Signed)
If he is has not improved with antibiotics I recommend coming in for an in-person appointment to reassess and for possible chest x-ray.

## 2021-05-23 ENCOUNTER — Ambulatory Visit: Payer: Medicare PPO | Admitting: Nurse Practitioner

## 2021-05-23 NOTE — Telephone Encounter (Signed)
Appt made for tomorrow per patients request 

## 2021-05-23 NOTE — Progress Notes (Signed)
EPIC Encounter for ICM Monitoring  Patient Name: Douglas Carr is a 56 y.o. male Date: 05/23/2021 Primary Care Physican: [name], Fransisca Kaufmann, MD Primary Cardiologist: Gwenlyn Found Electrophysiologist: [facility] Weight: 198 lbs                                                          Spoke with wife and heart failure questions reviewed.  Pt asymptomatic for fluid accumulation.  Reports feeling well at this time and voices no complaints.    Optivol thoracic impedance normal    Prescribed: Furosemide 40 mg One tablet daily as needed for weight gain 3 lb in one day or 5 lb in 1 week or for swelling   Labs: 11/08/2020 Creatinine 1.89, BUN 39, Potassium 5.2, Sodium 137, GFR 08/09/2020 Creatinine 1.42, BUN 26, Potassium 5.0, Sodium 138, GFR 59 08/06/2020 Creatinine 1.42, BUN 29, Potassium 6.1, Sodium 139, GFR 59  A complete set of results can be found in Results Review.   Recommendations:  No changes and encouraged to call if experiencing any fluid symptoms.   Follow-up plan: ICM clinic phone appointment on 06/24/2021.   91 day device clinic remote transmission 07/09/2021     EP/Cardiology Office Visits:  06/04/2021 with Dr Curt Bears.    Copy of ICM check sent to Dr. Curt Bears.     3 month ICM trend: 05/20/2021.    12-14 Month ICM trend:     Rosalene Billings, RN 05/23/2021 9:59 AM

## 2021-05-23 NOTE — Telephone Encounter (Signed)
I guess he ntbs

## 2021-05-23 NOTE — Telephone Encounter (Signed)
LMTCB to offer appt with MMM at 1:45

## 2021-05-24 ENCOUNTER — Ambulatory Visit (INDEPENDENT_AMBULATORY_CARE_PROVIDER_SITE_OTHER): Payer: Medicare PPO

## 2021-05-24 ENCOUNTER — Encounter: Payer: Self-pay | Admitting: Nurse Practitioner

## 2021-05-24 ENCOUNTER — Ambulatory Visit (INDEPENDENT_AMBULATORY_CARE_PROVIDER_SITE_OTHER): Payer: Medicare PPO | Admitting: Nurse Practitioner

## 2021-05-24 VITALS — BP 154/89 | HR 96 | Temp 98.2°F | Resp 20 | Ht 70.0 in | Wt 194.0 lb

## 2021-05-24 DIAGNOSIS — J069 Acute upper respiratory infection, unspecified: Secondary | ICD-10-CM

## 2021-05-24 MED ORDER — HYDROCODONE BIT-HOMATROP MBR 5-1.5 MG/5ML PO SOLN: 5.0000 mL | Freq: Four times a day (QID) | ORAL | 0 refills | Status: DC | PRN

## 2021-05-24 NOTE — Patient Instructions (Signed)

## 2021-05-24 NOTE — Progress Notes (Signed)
Subjective:    Patient ID: Douglas Carr, male    DOB: 1966/02/16, 56 y.o.   MRN: [id number]   Chief Complaint: Nasal Congestion ( ) and Cough   HPI Patient was seen 05/17/21 and was dx with uri and was given levaquin and hycodan. He is better overall but still has cough. Cough is worse at night. Would like a refill on cough meds.     Review of Systems  Constitutional:  Negative for diaphoresis.  HENT:  Positive for rhinorrhea. Negative for sinus pressure and sore throat.   Eyes:  Negative for pain.  Respiratory:  Positive for cough.   Cardiovascular:  Negative for chest pain, palpitations and leg swelling.  Gastrointestinal:  Negative for abdominal pain.  Endocrine: Negative for polydipsia.  Skin:  Negative for rash.  Neurological:  Negative for dizziness, weakness and headaches.  Hematological:  Does not bruise/bleed easily.  All other systems reviewed and are negative.     Objective:   Physical Exam Vitals and nursing note reviewed.  Constitutional:      Appearance: Normal appearance.  HENT:     Right Ear: Tympanic membrane normal.     Left Ear: Tympanic membrane normal.     Nose: Rhinorrhea present. No congestion.     Mouth/Throat:     Mouth: Mucous membranes are moist.  Cardiovascular:     Rate and Rhythm: Normal rate and regular rhythm.     Heart sounds: Normal heart sounds.  Pulmonary:     Effort: Pulmonary effort is normal.     Breath sounds: Normal breath sounds.  Skin:    General: Skin is warm and dry.  Neurological:     General: No focal deficit present.     Mental Status: He is alert and oriented to person, place, and time.  Psychiatric:        Mood and Affect: Mood normal.        Behavior: Behavior normal.     BP (!) 154/89    Pulse 96    Temp 98.2 F (36.8 C) (Temporal)    Resp 20    Ht 5\' 10"  (1.778 m)    Wt 194 lb (88 kg)    SpO2 100%    BMI 27.84 kg/m       Assessment & Plan:  in today with chief complaint of  Nasal Congestion ( ) and Cough   1. URI with cough and congestion 1. Take meds as prescribed 2. Use a cool mist humidifier especially during the winter months and when heat has been humid. 3. Use saline nose sprays frequently 4. Saline irrigations of the nose can be very helpful if done frequently.  * 4X daily for 1 week*  * Use of a nettie pot can be helpful with this. Follow directions with this* 5. Drink plenty of fluids 6. Keep thermostat turn down low 7.For any cough or congestion- hycodan as needed 8. For fever or aces or pains- take tylenol or ibuprofen appropriate for age and weight.  * for fevers greater than 101 orally you may alternate ibuprofen and tylenol every  3 hours.    - DG Chest 2 View Meds ordered this encounter  Medications   HYDROcodone bit-homatropine (HYCODAN) 5-1.5 MG/5ML syrup    Sig: Take 5 mLs by mouth every 6 (six) hours as needed for cough.    Dispense:  120 mL    Refill:  0    Order Specific Question:   Supervising  Provider    Answer:   Nils Pyle [[id number]]      The above assessment and management plan was discussed with the patient. The patient verbalized understanding of and has agreed to the management plan. Patient is aware to call the clinic if symptoms persist or worsen. Patient is aware when to return to the clinic for a follow-up visit. Patient educated on when it is appropriate to go to the emergency department.   Mary-Margaret Daphine Deutscher, FNP

## 2021-05-26 ENCOUNTER — Observation Stay (HOSPITAL_BASED_OUTPATIENT_CLINIC_OR_DEPARTMENT_OTHER)
Admission: EM | Admit: 2021-05-26 | Discharge: 2021-05-29 | Disposition: A | Discharge status: Home / Self Care | Payer: Medicare PPO | Attending: Internal Medicine | Admitting: Internal Medicine

## 2021-05-26 ENCOUNTER — Other Ambulatory Visit: Payer: Self-pay

## 2021-05-26 ENCOUNTER — Emergency Department (HOSPITAL_BASED_OUTPATIENT_CLINIC_OR_DEPARTMENT_OTHER): Payer: Medicare PPO | Admitting: Radiology

## 2021-05-26 ENCOUNTER — Encounter (HOSPITAL_BASED_OUTPATIENT_CLINIC_OR_DEPARTMENT_OTHER): Payer: Self-pay | Admitting: Emergency Medicine

## 2021-05-26 ENCOUNTER — Emergency Department (HOSPITAL_BASED_OUTPATIENT_CLINIC_OR_DEPARTMENT_OTHER): Payer: Medicare PPO

## 2021-05-26 DIAGNOSIS — Z20822 Contact with and (suspected) exposure to covid-19: Secondary | ICD-10-CM | POA: Diagnosis not present

## 2021-05-26 DIAGNOSIS — R55 Syncope and collapse: Secondary | ICD-10-CM | POA: Diagnosis not present

## 2021-05-26 DIAGNOSIS — R911 Solitary pulmonary nodule: Secondary | ICD-10-CM | POA: Insufficient documentation

## 2021-05-26 DIAGNOSIS — N183 Chronic kidney disease, stage 3 unspecified: Secondary | ICD-10-CM | POA: Diagnosis present

## 2021-05-26 DIAGNOSIS — F17220 Nicotine dependence, chewing tobacco, uncomplicated: Secondary | ICD-10-CM | POA: Diagnosis not present

## 2021-05-26 DIAGNOSIS — Z79899 Other long term (current) drug therapy: Secondary | ICD-10-CM | POA: Insufficient documentation

## 2021-05-26 DIAGNOSIS — Z95810 Presence of automatic (implantable) cardiac defibrillator: Secondary | ICD-10-CM | POA: Diagnosis not present

## 2021-05-26 DIAGNOSIS — Z7902 Long term (current) use of antithrombotics/antiplatelets: Secondary | ICD-10-CM | POA: Diagnosis not present

## 2021-05-26 DIAGNOSIS — E1165 Type 2 diabetes mellitus with hyperglycemia: Secondary | ICD-10-CM | POA: Diagnosis not present

## 2021-05-26 DIAGNOSIS — F32A Depression, unspecified: Secondary | ICD-10-CM | POA: Diagnosis present

## 2021-05-26 DIAGNOSIS — R918 Other nonspecific abnormal finding of lung field: Secondary | ICD-10-CM | POA: Diagnosis present

## 2021-05-26 DIAGNOSIS — Z951 Presence of aortocoronary bypass graft: Secondary | ICD-10-CM | POA: Insufficient documentation

## 2021-05-26 DIAGNOSIS — E785 Hyperlipidemia, unspecified: Secondary | ICD-10-CM | POA: Diagnosis present

## 2021-05-26 DIAGNOSIS — Y9301 Activity, walking, marching and hiking: Secondary | ICD-10-CM | POA: Insufficient documentation

## 2021-05-26 DIAGNOSIS — W1839XA Other fall on same level, initial encounter: Secondary | ICD-10-CM | POA: Insufficient documentation

## 2021-05-26 DIAGNOSIS — I130 Hypertensive heart and chronic kidney disease with heart failure and stage 1 through stage 4 chronic kidney disease, or unspecified chronic kidney disease: Secondary | ICD-10-CM | POA: Diagnosis not present

## 2021-05-26 DIAGNOSIS — E1122 Type 2 diabetes mellitus with diabetic chronic kidney disease: Secondary | ICD-10-CM | POA: Insufficient documentation

## 2021-05-26 DIAGNOSIS — I5042 Chronic combined systolic (congestive) and diastolic (congestive) heart failure: Secondary | ICD-10-CM | POA: Diagnosis not present

## 2021-05-26 DIAGNOSIS — F419 Anxiety disorder, unspecified: Secondary | ICD-10-CM | POA: Diagnosis present

## 2021-05-26 DIAGNOSIS — R0781 Pleurodynia: Secondary | ICD-10-CM | POA: Diagnosis not present

## 2021-05-26 DIAGNOSIS — Z7982 Long term (current) use of aspirin: Secondary | ICD-10-CM | POA: Diagnosis not present

## 2021-05-26 DIAGNOSIS — I779 Disorder of arteries and arterioles, unspecified: Secondary | ICD-10-CM | POA: Diagnosis present

## 2021-05-26 DIAGNOSIS — I2510 Atherosclerotic heart disease of native coronary artery without angina pectoris: Secondary | ICD-10-CM | POA: Insufficient documentation

## 2021-05-26 DIAGNOSIS — I25119 Atherosclerotic heart disease of native coronary artery with unspecified angina pectoris: Secondary | ICD-10-CM | POA: Diagnosis present

## 2021-05-26 DIAGNOSIS — Z794 Long term (current) use of insulin: Secondary | ICD-10-CM | POA: Insufficient documentation

## 2021-05-26 DIAGNOSIS — I10 Essential (primary) hypertension: Secondary | ICD-10-CM | POA: Diagnosis present

## 2021-05-26 DIAGNOSIS — R251 Tremor, unspecified: Secondary | ICD-10-CM | POA: Diagnosis not present

## 2021-05-26 DIAGNOSIS — E1169 Type 2 diabetes mellitus with other specified complication: Secondary | ICD-10-CM | POA: Diagnosis present

## 2021-05-26 DIAGNOSIS — Z955 Presence of coronary angioplasty implant and graft: Secondary | ICD-10-CM | POA: Diagnosis not present

## 2021-05-26 DIAGNOSIS — E669 Obesity, unspecified: Secondary | ICD-10-CM | POA: Diagnosis present

## 2021-05-26 DIAGNOSIS — E1159 Type 2 diabetes mellitus with other circulatory complications: Secondary | ICD-10-CM | POA: Diagnosis present

## 2021-05-26 DIAGNOSIS — E119 Type 2 diabetes mellitus without complications: Secondary | ICD-10-CM

## 2021-05-26 LAB — BASIC METABOLIC PANEL
Anion gap: 9 (ref 5–15)
Anion gap: 9 (ref 5–15)
BUN: 45 mg/dL — ABNORMAL HIGH (ref 6–20)
BUN: 41 mg/dL — ABNORMAL HIGH (ref 6–20)
CO2: 22 mmol/L (ref 22–32)
CO2: 24 mmol/L (ref 22–32)
Calcium: 9 mg/dL (ref 8.9–10.3)
Calcium: 9.1 mg/dL (ref 8.9–10.3)
Chloride: 104 mmol/L (ref 98–111)
Chloride: 108 mmol/L (ref 98–111)
Creatinine, Ser: 1.54 mg/dL — ABNORMAL HIGH (ref 0.61–1.24)
Creatinine, Ser: 1.54 mg/dL — ABNORMAL HIGH (ref 0.61–1.24)
GFR, Estimated: 53 mL/min — ABNORMAL LOW (ref >60)
GFR, Estimated: 53 mL/min — ABNORMAL LOW (ref >60)
Glucose, Bld: 323 mg/dL — ABNORMAL HIGH (ref 70–99)
Glucose, Bld: 225 mg/dL — ABNORMAL HIGH (ref 70–99)
Potassium: 5.3 mmol/L — ABNORMAL HIGH (ref 3.5–5.1)
Potassium: 6.3 mmol/L (ref 3.5–5.1)
Sodium: 135 mmol/L (ref 135–145)
Sodium: 141 mmol/L (ref 135–145)

## 2021-05-26 LAB — CBC
HCT: 43.4 % (ref 39.0–52.0)
Hemoglobin: 14.1 g/dL (ref 13.0–17.0)
MCH: 27.4 pg (ref 26.0–34.0)
MCHC: 32.5 g/dL (ref 30.0–36.0)
MCV: 84.3 fL (ref 80.0–100.0)
Platelets: 214 10*3/uL (ref 150–400)
RBC: 5.15 MIL/uL (ref 4.22–5.81)
RDW: 13.1 % (ref 11.5–15.5)
WBC: 12.5 10*3/uL — ABNORMAL HIGH (ref 4.0–10.5)
nRBC: 0 % (ref 0.0–0.2)

## 2021-05-26 LAB — HIV ANTIBODY (ROUTINE TESTING W REFLEX): HIV Screen 4th Generation wRfx: NONREACTIVE

## 2021-05-26 LAB — URINALYSIS, ROUTINE W REFLEX MICROSCOPIC
Bilirubin Urine: NEGATIVE
Glucose, UA: >1000 mg/dL — AB
Hgb urine dipstick: NEGATIVE
Ketones, ur: NEGATIVE mg/dL
Leukocytes,Ua: NEGATIVE
Nitrite: NEGATIVE
Protein, ur: 30 mg/dL — AB
Specific Gravity, Urine: 1.017 (ref 1.005–1.030)
pH: 6.5 (ref 5.0–8.0)

## 2021-05-26 LAB — RESP PANEL BY RT-PCR (FLU A&B, COVID) ARPGX2
Influenza A by PCR: NEGATIVE
Influenza B by PCR: NEGATIVE
SARS Coronavirus 2 by RT PCR: NEGATIVE

## 2021-05-26 LAB — CBG MONITORING, ED: Glucose-Capillary: 246 mg/dL — ABNORMAL HIGH (ref 70–99)

## 2021-05-26 LAB — TROPONIN I (HIGH SENSITIVITY)
Troponin I (High Sensitivity): 25 ng/L — ABNORMAL HIGH (ref <18)
Troponin I (High Sensitivity): 24 ng/L — ABNORMAL HIGH (ref <18)

## 2021-05-26 MED ORDER — INSULIN ASPART 100 UNIT/ML IJ SOLN
10.0000 [IU] | Freq: Once | INTRAMUSCULAR | Status: AC
  Administered 2021-05-26: 10 [IU] via INTRAVENOUS

## 2021-05-26 MED ORDER — OXYCODONE HCL 5 MG PO TABS
5.0000 mg | ORAL_TABLET | ORAL | Status: DC | PRN
  Administered 2021-05-27 – 2021-05-28 (×4): 5 mg via ORAL
  Filled 2021-05-26 (×4): qty 1

## 2021-05-26 MED ORDER — DULOXETINE HCL 60 MG PO CPEP
120.0000 mg | ORAL_CAPSULE | Freq: Every day | ORAL | Status: DC
  Administered 2021-05-27 – 2021-05-29 (×3): 120 mg via ORAL
  Filled 2021-05-26 (×3): qty 2

## 2021-05-26 MED ORDER — ASPIRIN EC 81 MG PO TBEC
81.0000 mg | DELAYED_RELEASE_TABLET | Freq: Every day | ORAL | Status: DC
  Administered 2021-05-27 – 2021-05-29 (×3): 81 mg via ORAL
  Filled 2021-05-26 (×3): qty 1

## 2021-05-26 MED ORDER — DEXTROSE 50 % IV SOLN
25.0000 mL | Freq: Once | INTRAVENOUS | Status: AC
  Administered 2021-05-26: 25 mL via INTRAVENOUS
  Filled 2021-05-26: qty 50

## 2021-05-26 MED ORDER — MIRTAZAPINE 7.5 MG PO TABS
7.5000 mg | ORAL_TABLET | Freq: Every day | ORAL | Status: DC
  Administered 2021-05-26 – 2021-05-28 (×3): 7.5 mg via ORAL
  Filled 2021-05-26 (×4): qty 1

## 2021-05-26 MED ORDER — ACETAMINOPHEN 325 MG PO TABS
650.0000 mg | ORAL_TABLET | Freq: Four times a day (QID) | ORAL | Status: DC | PRN
  Administered 2021-05-29: 650 mg via ORAL
  Filled 2021-05-26: qty 2

## 2021-05-26 MED ORDER — DICLOFENAC SODIUM 1 % EX GEL
2.0000 g | Freq: Four times a day (QID) | CUTANEOUS | Status: DC
  Administered 2021-05-26 – 2021-05-29 (×10): 2 g via TOPICAL
  Filled 2021-05-26: qty 100

## 2021-05-26 MED ORDER — SODIUM ZIRCONIUM CYCLOSILICATE 10 G PO PACK
10.0000 g | PACK | Freq: Once | ORAL | Status: AC
  Administered 2021-05-27: 10 g via ORAL
  Filled 2021-05-26 (×2): qty 1

## 2021-05-26 MED ORDER — PRIMIDONE 50 MG PO TABS
50.0000 mg | ORAL_TABLET | Freq: Every day | ORAL | Status: DC
  Administered 2021-05-26 – 2021-05-28 (×3): 50 mg via ORAL
  Filled 2021-05-26 (×4): qty 1

## 2021-05-26 MED ORDER — SODIUM CHLORIDE 0.9 % IV SOLN: 250.0000 mL | INTRAVENOUS | Status: DC | PRN

## 2021-05-26 MED ORDER — KETOROLAC TROMETHAMINE 15 MG/ML IJ SOLN
15.0000 mg | Freq: Once | INTRAMUSCULAR | Status: AC
  Administered 2021-05-26: 15 mg via INTRAVENOUS
  Filled 2021-05-26: qty 1

## 2021-05-26 MED ORDER — HYDROXYZINE HCL 25 MG PO TABS
25.0000 mg | ORAL_TABLET | Freq: Three times a day (TID) | ORAL | Status: DC | PRN
  Administered 2021-05-26 – 2021-05-28 (×3): 25 mg via ORAL
  Filled 2021-05-26 (×3): qty 1

## 2021-05-26 MED ORDER — SODIUM CHLORIDE 0.9% FLUSH: 3.0000 mL | INTRAVENOUS | Status: DC | PRN

## 2021-05-26 MED ORDER — SODIUM CHLORIDE 0.9% FLUSH
3.0000 mL | Freq: Two times a day (BID) | INTRAVENOUS | Status: DC
  Administered 2021-05-29: 3 mL via INTRAVENOUS

## 2021-05-26 MED ORDER — INSULIN GLARGINE-YFGN 100 UNIT/ML ~~LOC~~ SOLN
8.0000 [IU] | Freq: Every day | SUBCUTANEOUS | Status: DC
  Administered 2021-05-26 – 2021-05-28 (×3): 8 [IU] via SUBCUTANEOUS
  Filled 2021-05-26 (×4): qty 0.08

## 2021-05-26 MED ORDER — CALCIUM GLUCONATE-NACL 1-0.675 GM/50ML-% IV SOLN
1.0000 g | Freq: Once | INTRAVENOUS | Status: AC
  Administered 2021-05-27: 1000 mg via INTRAVENOUS
  Filled 2021-05-26: qty 50

## 2021-05-26 MED ORDER — ENOXAPARIN SODIUM 40 MG/0.4ML IJ SOSY
40.0000 mg | PREFILLED_SYRINGE | INTRAMUSCULAR | Status: DC
  Administered 2021-05-26 – 2021-05-28 (×3): 40 mg via SUBCUTANEOUS
  Filled 2021-05-26 (×3): qty 0.4

## 2021-05-26 MED ORDER — TAMSULOSIN HCL 0.4 MG PO CAPS
0.4000 mg | ORAL_CAPSULE | Freq: Every day | ORAL | Status: DC
  Administered 2021-05-26 – 2021-05-28 (×3): 0.4 mg via ORAL
  Filled 2021-05-26 (×3): qty 1

## 2021-05-26 MED ORDER — ACETAMINOPHEN 650 MG RE SUPP: 650.0000 mg | Freq: Four times a day (QID) | RECTAL | Status: DC | PRN

## 2021-05-26 MED ORDER — CARVEDILOL 3.125 MG PO TABS
3.1250 mg | ORAL_TABLET | Freq: Two times a day (BID) | ORAL | Status: DC
  Administered 2021-05-26 – 2021-05-27 (×2): 3.125 mg via ORAL
  Filled 2021-05-26 (×2): qty 1

## 2021-05-26 MED ORDER — MORPHINE SULFATE (PF) 2 MG/ML IV SOLN
1.0000 mg | INTRAVENOUS | Status: DC | PRN
  Administered 2021-05-26: 1 mg via INTRAVENOUS
  Filled 2021-05-26: qty 1

## 2021-05-26 MED ORDER — SACUBITRIL-VALSARTAN 24-26 MG PO TABS
1.0000 | ORAL_TABLET | Freq: Two times a day (BID) | ORAL | Status: DC
  Administered 2021-05-26: 1 via ORAL
  Filled 2021-05-26: qty 1

## 2021-05-26 MED ORDER — ATORVASTATIN CALCIUM 80 MG PO TABS
80.0000 mg | ORAL_TABLET | Freq: Every evening | ORAL | Status: DC
  Administered 2021-05-27 – 2021-05-28 (×2): 80 mg via ORAL
  Filled 2021-05-26 (×2): qty 1

## 2021-05-26 MED ORDER — SODIUM CHLORIDE 0.9% FLUSH
3.0000 mL | Freq: Two times a day (BID) | INTRAVENOUS | Status: DC
  Administered 2021-05-26 – 2021-05-29 (×5): 3 mL via INTRAVENOUS

## 2021-05-26 MED ORDER — LACTATED RINGERS IV BOLUS
1000.0000 mL | Freq: Once | INTRAVENOUS | Status: AC
  Administered 2021-05-26: 1000 mL via INTRAVENOUS

## 2021-05-26 MED ORDER — TOPIRAMATE 25 MG PO TABS
50.0000 mg | ORAL_TABLET | Freq: Every evening | ORAL | Status: DC | PRN
  Filled 2021-05-26: qty 2

## 2021-05-26 MED ORDER — INSULIN ASPART 100 UNIT/ML IJ SOLN
0.0000 [IU] | Freq: Three times a day (TID) | INTRAMUSCULAR | Status: DC
  Administered 2021-05-26: 3 [IU] via SUBCUTANEOUS
  Administered 2021-05-27: 2 [IU] via SUBCUTANEOUS
  Administered 2021-05-27: 3 [IU] via SUBCUTANEOUS
  Administered 2021-05-27 – 2021-05-28 (×2): 1 [IU] via SUBCUTANEOUS
  Administered 2021-05-28: 2 [IU] via SUBCUTANEOUS
  Administered 2021-05-28: 3 [IU] via SUBCUTANEOUS
  Administered 2021-05-29: 5 [IU] via SUBCUTANEOUS
  Administered 2021-05-29: 1 [IU] via SUBCUTANEOUS

## 2021-05-26 MED ORDER — CLOPIDOGREL BISULFATE 75 MG PO TABS
75.0000 mg | ORAL_TABLET | Freq: Every evening | ORAL | Status: DC
  Administered 2021-05-26 – 2021-05-28 (×3): 75 mg via ORAL
  Filled 2021-05-26 (×3): qty 1

## 2021-05-26 NOTE — Assessment & Plan Note (Addendum)
No signs of volume overload and appears euvolemic Intake/output and daily weight Last echo in 9/21 with TEE EF estimated to be 40-45%. On echo 9/21 grade 3 dd Continue entresto, reduced coreg. Held isordil tonight -ICD 2018

## 2021-05-26 NOTE — ED Notes (Signed)
Pt has been on levaquin that stopped few days ago and is taking cough medicine with codeine daily for cough.

## 2021-05-26 NOTE — Assessment & Plan Note (Addendum)
Follow outpatient  

## 2021-05-26 NOTE — Assessment & Plan Note (Signed)
He seems unaware of this, but renal function has been elevated for over a year at least Baseline creatinine: 1.4-1.8 At baseline, BUN slightly elevated and could be from syncope Potassium slightly elevated 1L IVF in ED, repeat bmp tonight Trend renal function I/O  UA with high glucose

## 2021-05-26 NOTE — Assessment & Plan Note (Addendum)
Poorly controlled diabetes a1c in 7/22 was 9.4 Unsure of medication, currently on ozempic only.  Was on tresiba and took "really high doses" now uses only to "get sugar under control" -starting him on levemir 8 units and SSI -likely needs long acting insulin in addition to ozempic -accucheks and SSI per protocol.

## 2021-05-26 NOTE — Assessment & Plan Note (Signed)
Secondary to fall, no fracture seen on xray Pain medication with topical voltaren, tylenol, oxycodone and then small amount of morphine for severe pain If pain persists, would CT IS on d/c

## 2021-05-26 NOTE — ED Provider Notes (Signed)
[facility]ERG[facility] DEPT Provider Note   CSN: [id number] Arrival date & time: 05/26/21  9480     History  Chief Complaint  Patient presents with   Loss of Consciousness    Douglas Carr is a 56 y.o. male.   Loss of Consciousness  56 year old male with a history significant for CAD status post CABG, CHF with ICD, DM 2, CKD, history of cardiac arrest and anoxic brain injury, hypertension, peripheral vascular disease who presents to the emergency department with syncope.  The patient got up from his bed and walk to the dresser when he felt lightheaded and passed out.  He does think he struck his head.  He had positive loss of consciousness.  He is currently on Plavix.  The patient came to with multiple people around him.  He had reportedly had loss of consciousness for a few minutes and had some generalized shaking.  He arrived to the [facility]ppage emergency department GCS 15, ABC intact.  Home Medications Prior to Admission medications   Medication Sig Start Date End Date Taking? Authorizing Provider  albuterol (VENTOLIN HFA) 108 (90 Base) MCG/ACT inhaler Inhale 2 puffs into the lungs every 6 (six) hours as needed for wheezing or shortness of breath. 04/10/21  Yes Rakes, Connye Burkitt, FNP  ASPIRIN LOW DOSE 81 MG EC tablet TAKE ONE TABLET BY MOUTH DAILY Patient taking differently: Take 81 mg by mouth daily. 05/17/21  Yes [name], Fransisca Kaufmann, MD  atorvastatin (LIPITOR) 80 MG tablet Take 1 tablet (80 mg total) by mouth every evening. 08/06/20  Yes [name], Fransisca Kaufmann, MD  carvedilol (COREG) 6.25 MG tablet TAKE ONE TABLET BY MOUTH TWICE DAILY WITH MEALS Patient taking differently: Take 6.25 mg by mouth 2 (two) times daily with a meal. 01/24/21  Yes Lorretta Harp, MD  clopidogrel (PLAVIX) 75 MG tablet TAKE ONE TABLET BY MOUTH IN THE EVENING Patient taking differently: Take 75 mg by mouth daily after supper. 08/06/20  Yes [name], Fransisca Kaufmann, MD  DULoxetine (CYMBALTA) 60 MG capsule  TAKE TWO CAPSULES BY MOUTH DAILY Patient taking differently: Take 120 mg by mouth daily. 05/17/21  Yes [name], Fransisca Kaufmann, MD  ENTRESTO 24-26 MG TAKE ONE TABLET BY MOUTH TWICE DAILY Patient taking differently: Take 1 tablet by mouth 2 (two) times daily. 09/27/20  Yes Lorretta Harp, MD  furosemide (LASIX) 40 MG tablet One tablet daily as needed for weight gain 3 lb in one day or 5 lb in 1 week or for swelling Patient taking differently: Take 40 mg by mouth See admin instructions. One tablet daily as needed for weight gain 3 lb in one day or 5 lb in 1 week or for swelling 01/24/21  Yes Lorretta Harp, MD  HYDROcodone bit-homatropine (HYCODAN) 5-1.5 MG/5ML syrup Take 5 mLs by mouth every 6 (six) hours as needed for cough. 05/24/21  Yes Martin, Mary-Margaret, FNP  hydrOXYzine (ATARAX) 25 MG tablet TAKE ONE TABLET BY MOUTH THREE TIMES DAILY AS NEEDED Patient taking differently: Take 25 mg by mouth daily. 05/22/21  Yes [name], Fransisca Kaufmann, MD  ibuprofen (ADVIL) 200 MG tablet Take 400 mg by mouth every 6 (six) hours as needed for mild pain, moderate pain or headache.   Yes [provider]  isosorbide dinitrate (ISORDIL) 10 MG tablet TAKE ONE TABLET BY MOUTH TWICE DAILY Patient taking differently: Take 10 mg by mouth 2 (two) times daily. 03/11/21  Yes Lorretta Harp, MD  levofloxacin (LEVAQUIN) 500 MG tablet Take 1 tablet (500  mg total) by mouth daily. 05/17/21  Yes Martin, Mary-Margaret, FNP  mirtazapine (REMERON) 7.5 MG tablet Take 1 tablet (7.5 mg total) by mouth at bedtime. TAKE ONE TABLET BY MOUTH AT BEDTIME FOR DEPRESSION Patient taking differently: Take 7.5 mg by mouth at bedtime. 08/06/20  Yes [name], Fransisca Kaufmann, MD  primidone (MYSOLINE) 50 MG tablet TAKE ONE TABLET BY MOUTH AT BEDTIME Patient taking differently: Take 50 mg by mouth at bedtime. 05/17/21  Yes [name], Fransisca Kaufmann, MD  Semaglutide, 2 MG/DOSE, 8 MG/3ML SOPN Inject 2 mg as directed once a week. Patient taking differently:  Inject 2 mg as directed every Saturday. 11/08/20  Yes [name], Fransisca Kaufmann, MD  tamsulosin (FLOMAX) 0.4 MG CAPS capsule TAKE 1 CAPSULE DAILY IN THE EVENING Patient taking differently: Take 0.4 mg by mouth daily after supper. 08/06/20  Yes [name], Fransisca Kaufmann, MD  topiramate (TOPAMAX) 50 MG tablet TAKE ONE TABLET BY MOUTH AT BEDTIME AS NEEDED Patient taking differently: Take 50 mg by mouth as needed (tremors). 05/17/21  Yes [name], Fransisca Kaufmann, MD  blood glucose meter kit and supplies KIT Inject 1 each into the skin 4 (four) times daily as needed. Dispense based on patient and insurance preference. Use up to four times daily as directed. (FOR ICD-9 250.00, 250.01). 06/30/19   [name], Fransisca Kaufmann, MD  glucose blood (ONETOUCH VERIO) test strip 1 each by Other route in the morning, at noon, in the evening, and at bedtime. use for testing 06/30/19   [name], Fransisca Kaufmann, MD  Insulin Pen Needle (B-D ULTRAFINE III SHORT PEN) 31G X 8 MM MISC USE TO INJECT INSULIN DAILY DX E11.9 02/21/20   [name], Fransisca Kaufmann, MD  OneTouch Delica Lancets 63J MISC Test blood sugars three times daily 09/30/19   [name], Fransisca Kaufmann, MD      Allergies    Patient has no known allergies.    Review of Systems   Review of Systems  Cardiovascular:  Positive for syncope.  Neurological:  Positive for syncope.  All other systems reviewed and are negative.  Physical Exam Updated Vital Signs BP (!) 181/87 (BP Location: Left Arm)    Pulse 94    Temp 98.1 F (36.7 C) (Oral)    Resp 18    Wt 85.3 kg    SpO2 100%    BMI 26.98 kg/m  Physical Exam Vitals and nursing note reviewed.  Constitutional:      General: He is not in acute distress.    Appearance: He is well-developed.     Comments: GCS 15, ABC intact  HENT:     Head: Normocephalic and atraumatic.  Eyes:     Conjunctiva/sclera: Conjunctivae normal.     Pupils: Pupils are equal, round, and reactive to light.  Neck:     Vascular: No JVD.     Comments: No JVD Cardiovascular:      Rate and Rhythm: Normal rate and regular rhythm.     Heart sounds: No murmur heard. Pulmonary:     Effort: Pulmonary effort is normal. No respiratory distress.     Breath sounds: Normal breath sounds.  Chest:     Comments: Chest wall stable  to AP and lateral compression. Clavicles stable and non-tender to AP compression.  Right side chest wall tenderness present Abdominal:     General: There is no distension.     Palpations: Abdomen is soft.     Tenderness: There is no abdominal tenderness. There is no guarding.     Comments: Pelvis  stable to lateral compression.  Musculoskeletal:        General: No swelling, deformity or signs of injury.     Cervical back: Neck supple.     Comments: No lower extremity edema bilaterally  Skin:    General: Skin is warm and dry.     Capillary Refill: Capillary refill takes less than 2 seconds.     Findings: No lesion or rash.  Neurological:     General: No focal deficit present.     Mental Status: He is alert. Mental status is at baseline.     Cranial Nerves: No cranial nerve deficit.     Sensory: No sensory deficit.     Motor: No weakness.  Psychiatric:        Mood and Affect: Mood normal.    ED Results / Procedures / Treatments   Labs (all labs ordered are listed, but only abnormal results are displayed) Labs Reviewed  BASIC METABOLIC PANEL - Abnormal; Notable for the following components:      Result Value   Potassium 5.3 (*)    Glucose, Bld 323 (*)    BUN 45 (*)    Creatinine, Ser 1.54 (*)    GFR, Estimated 53 (*)    All other components within normal limits  CBC - Abnormal; Notable for the following components:   WBC 12.5 (*)    All other components within normal limits  URINALYSIS, ROUTINE W REFLEX MICROSCOPIC - Abnormal; Notable for the following components:   Glucose, UA >1,000 (*)    Protein, ur 30 (*)    All other components within normal limits  CBG MONITORING, ED - Abnormal; Notable for the following components:    Glucose-Capillary 246 (*)    All other components within normal limits  TROPONIN I (HIGH SENSITIVITY) - Abnormal; Notable for the following components:   Troponin I (High Sensitivity) 25 (*)    All other components within normal limits  TROPONIN I (HIGH SENSITIVITY) - Abnormal; Notable for the following components:   Troponin I (High Sensitivity) 24 (*)    All other components within normal limits  RESP PANEL BY RT-PCR (FLU A&B, COVID) ARPGX2  HIV ANTIBODY (ROUTINE TESTING W REFLEX)  BASIC METABOLIC PANEL  CBC  HEMOGLOBIN Y1P  BASIC METABOLIC PANEL    EKG EKG Interpretation  Date/Time:  Sunday May 26 2021 09:26:41 EST Ventricular Rate:  80 PR Interval:  176 QRS Duration: 112 QT Interval:  382 QTC Calculation: 440 R Axis:   -66 Text Interpretation: Normal sinus rhythm Left axis deviation Minimal voltage criteria for LVH, may be normal variant ( Cornell product ) Septal infarct (cited on or before 27-Jan-2020) ST & T wave abnormality, consider inferolateral ischemia ST depressions in leads II and aVF Abnormal ECG When compared with ECG of 26-May-2021 09:25, No significant change was found Reconfirmed by Regan Lemming ([id number]) on 05/26/2021 8:56:31 PM  Radiology DG Ribs Unilateral W/Chest Right  Result Date: 05/26/2021 CLINICAL DATA:  Syncope.  Fall.  Right rib pain. EXAM: RIGHT RIBS AND CHEST - 3+ VIEW COMPARISON:  None. FINDINGS: The lungs are clear without focal pneumonia, edema, pneumothorax or pleural effusion. Calcified granulomata again noted right middle lobe as seen on CT chest 04/12/2012. Sclerotic lesion anterior right third rib also stable since prior CT. No evidence for pneumothorax or pleural effusion. Pacer/AICD noted. The cardiopericardial silhouette is within normal limits for size. Oblique views of the right ribs show no evidence for displaced acute right-sided rib fracture. IMPRESSION: 1. No  acute cardiopulmonary findings. 2. No displaced acute right-sided rib  fracture. 3. Stable calcified granulomata right middle lobe and sclerotic lesion anterior right third rib. Electronically Signed   By: Misty Stanley M.D.   On: 05/26/2021 12:08   CT Head Wo Contrast  Result Date: 05/26/2021 CLINICAL DATA:  Patient with syncope.  Fall. EXAM: CT HEAD WITHOUT CONTRAST TECHNIQUE: Contiguous axial images were obtained from the base of the skull through the vertex without intravenous contrast. RADIATION DOSE REDUCTION: This exam was performed according to the departmental dose-optimization program which includes automated exposure control, adjustment of the mA and/or kV according to patient size and/or use of iterative reconstruction technique. COMPARISON:  Brain CT 05/20/2017. FINDINGS: Brain: Ventricles and sulci are prominent compatible with atrophy. Periventricular and subcortical white matter hypodensities compatible with chronic microvascular ischemic changes. No evidence for acute cortically based infarct, intracranial hemorrhage, mass lesion or mass-effect. Vascular: Unremarkable Skull: Intact Sinuses/Orbits: Paranasal sinuses are well aerated. Mastoid air cells are unremarkable. Other: None. IMPRESSION: No acute intracranial process. Atrophy and chronic microvascular ischemic changes. Electronically Signed   By: Lovey Newcomer M.D.   On: 05/26/2021 11:55    Procedures Procedures    Medications Ordered in ED Medications  insulin aspart (novoLOG) injection 0-9 Units (3 Units Subcutaneous Given 05/26/21 1712)  aspirin EC tablet 81 mg (has no administration in time range)  atorvastatin (LIPITOR) tablet 80 mg (has no administration in time range)  sacubitril-valsartan (ENTRESTO) 24-26 mg per tablet (has no administration in time range)  carvedilol (COREG) tablet 3.125 mg (3.125 mg Oral Given 05/26/21 2015)  DULoxetine (CYMBALTA) DR capsule 120 mg (has no administration in time range)  hydrOXYzine (ATARAX) tablet 25 mg (has no administration in time range)  mirtazapine  (REMERON) tablet 7.5 mg (has no administration in time range)  tamsulosin (FLOMAX) capsule 0.4 mg (0.4 mg Oral Given 05/26/21 2015)  clopidogrel (PLAVIX) tablet 75 mg (75 mg Oral Given 05/26/21 2015)  primidone (MYSOLINE) tablet 50 mg (has no administration in time range)  topiramate (TOPAMAX) tablet 50 mg (has no administration in time range)  sodium chloride flush (NS) 0.9 % injection 3 mL (has no administration in time range)  enoxaparin (LOVENOX) injection 40 mg (has no administration in time range)  sodium chloride flush (NS) 0.9 % injection 3 mL (has no administration in time range)  sodium chloride flush (NS) 0.9 % injection 3 mL (has no administration in time range)  0.9 %  sodium chloride infusion (has no administration in time range)  acetaminophen (TYLENOL) tablet 650 mg (has no administration in time range)    Or  acetaminophen (TYLENOL) suppository 650 mg (has no administration in time range)  oxyCODONE (Oxy IR/ROXICODONE) immediate release tablet 5 mg (has no administration in time range)  morphine 2 MG/ML injection 1 mg (1 mg Intravenous Given 05/26/21 2016)  diclofenac Sodium (VOLTAREN) 1 % topical gel 2 g (has no administration in time range)  insulin glargine-yfgn (SEMGLEE) injection 8 Units (has no administration in time range)  lactated ringers bolus 1,000 mL (1,000 mLs Intravenous New Bag/Given 05/26/21 1326)  ketorolac (TORADOL) 15 MG/ML injection 15 mg (15 mg Intravenous Given 05/26/21 1427)    ED Course/ Medical Decision Making/ A&P                           Medical Decision Making Amount and/or Complexity of Data Reviewed Labs: ordered. Radiology: ordered.  Risk Prescription drug management. Decision regarding hospitalization.  56 year old male with a history significant for CAD status post CABG, CHF with ICD, DM 2, CKD, history of cardiac arrest and anoxic brain injury, hypertension, peripheral vascular disease who presents to the emergency department with  syncope.  The patient got up from his bed and walk to the dresser when he felt lightheaded and passed out.  He does think he struck his head.  He had positive loss of consciousness.  He is currently on Plavix.  The patient came to with multiple people around him.  He had reportedly had loss of consciousness for a few minutes and had some generalized shaking.  He arrived to the [facility]pp[facility] emergency department GCS 15, ABC intact.  On arrival, the patient was afebrile, hemodynamically stable, mildly soft blood pressure with a low diastolic blood pressure 444/58, saturating her percent on room air.  Normal sinus rhythm noted on cardiac telemetry.  Differential diagnosis includes orthostatic hypotension, vasovagal syncope, cardiac arrhythmia, seizure.  The patient's EKG revealed normal sinus rhythm, ventricular rate 80 0, some ST depressions noted in the inferior leads in leads II and aVF which are new compared to prior.  The patient denies any chest pain or palpitations.  He denies any shortness of breath.  He does not look hypervolemic.  He did have positive orthostatic vitals in the emergency department and was administered a 1 L IV fluid bolus.  Will be holding further following resuscitation in the setting of the patient's CHF.  Work-up significant for CBG of 246, BMP with mild hyperkalemia to 5.3, BUN 45, creatinine 1.54, troponins 24, UA negative for UTI, CBC with a mild leukocytosis to 12.5, COVID-19 and influenza PCR testing negative.  The patient underwent CT imaging of the head to evaluate for intracranial abnormality in the setting of his fall on Plavix.  It did reveal no evidence of intracranial bleed or other acute intracranial abnormality or traumatic injury.  X-ray of the ribs and chest was performed and reviewed by myself and radiology and revealed no evidence of rib fracture or  other acute intrathoracic abnormality.  The patient states that he did recently run out of his home medications  because they are mail order.  He states that he recently restarted all of his medications.  Unclear etiology of the patient's syncope although he did have positive orthostatics.  He does have a very high risk cardiac history and due to this, I did recommend observation for cardiac telemetry in the hospital.  Cardiology consult placed.  Hospitalist consult placed for admission for observation and Dr. Rogers Blocker excepted the patient in admission.   Final Clinical Impression(s) / ED Diagnoses Final diagnoses:  Syncope and collapse    Rx / DC Orders ED Discharge Orders     None         Regan Lemming, MD 05/26/21 2100

## 2021-05-26 NOTE — ED Notes (Signed)
Patient transported to CT 

## 2021-05-26 NOTE — Assessment & Plan Note (Signed)
Continue statin. 

## 2021-05-26 NOTE — Assessment & Plan Note (Addendum)
56 year old with episode of syncope and collapse likely secondary to orthostasis -obs to telemetry -orthostatics have been + x 2 -no signs of intravascular depletion/dehydration. Received 1L bolus in ED. Hold fluid in light of combined CHF ? More autonomic dysfunction vs. Med related  -TED hose -echo and continue telemetry -known bilateral carotid artery disease, carotid dopplers ordered.  -blood pressure medication being adjusted and will need to continue to be titrated-holding isordil, decrease coreg see below.  -cardiology to see with complicated cardiac history

## 2021-05-26 NOTE — Assessment & Plan Note (Signed)
-   S/p right CEA in 2010 with subsequent occlusion. Last carotid dopplers in 01/2020 showed known total occlusion of right ICA and 40-59% stenosis of left ICA as well as occlusion of right vertebral artery.  - Continue DAPT and statin. -due for repeat imagine gin 9/22 and has not had this done -carotid dopplers ordered

## 2021-05-26 NOTE — Assessment & Plan Note (Addendum)
-  hx of OM2 PCI in setting of STEMI in 2019- complicated by edge dissection-VF- CPR-Treated with CFX PCI/DES CABG x 5 Sept 2021 -continue high dose statin, DAPT, coreg  -chest pain free -holding isordil in setting of orthostatic hypotension

## 2021-05-26 NOTE — ED Notes (Signed)
Consulted with doctor lawsing about placing order for head CT in triage. MD reports to wait until pt is evaluated by MD

## 2021-05-26 NOTE — Progress Notes (Signed)
Received a phone call from Facility: [facility]  Requesting MD: Dr. 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 Came on suddenly while ambulating after standing up.  No shortness of breath and no chest pain. Complaining of right sided rib pain.   Vitals: stable. +orthostatic hypotension. Pertinent labs with sugar of 323, creatinine 1.54 (1.4-1.89), WBC: 12.5, CTH: no acute finding. Rib xray negative for fracture.   He did run out of all of his medication and then just recently started back. ? Orthostasis from starting back medication, but high risk cardiac history.   Plan of care: obs to telemetry. Cardiology was consulted by EDP but he has not heard back from them.   The patient will be accepted for admission to telemetry at [facility] when bed is available.  Put in diet order/glycemic SSI order. Ins/outs and daily weights.    Nursing staff, Please call the [facility] Admits & Consults System-Wide number at the top of Amion at the time of the patient's arrival so that the patient can be paged to the admitting physician.   Lanney Gins, M.D. [facility]

## 2021-05-26 NOTE — Assessment & Plan Note (Addendum)
Continue mysoline and topomax prn

## 2021-05-26 NOTE — ED Notes (Signed)
RN attempted to start IV and draw labs. Pt states he only wants an xray of his ribs and does not want any blood work or IV."I know why I passed out, my blood pressure was low."

## 2021-05-26 NOTE — ED Triage Notes (Signed)
Pt presents to ED POV. Pt c/o syncope and unwitnessed fall. Pt reports that he woke up this morning feeling lightheaded. Pt reports that he woke up and walked to the dresser and had syncopal event. Pt c/o R rib pain. Pt taking plavix

## 2021-05-26 NOTE — Assessment & Plan Note (Signed)
Continue cymbalta, remeron and hydroxyzine

## 2021-05-26 NOTE — Assessment & Plan Note (Signed)
Blood pressure labile with +orthostasis Was off medication x 2-3 days, but has had this feeling of presyncope with standing for years -continue entresto -decrease coreg to 3.125mg  bid, has only been taking his 6.25mg  daily -hold imdur  -treat pain  -cardiology to see

## 2021-05-26 NOTE — H&P (Signed)
History and Physical    Douglas Carr IDP:[id number] DOB: [birth year] DOA: 05/26/2021  PCP: [name], Fransisca Kaufmann, MD Consultants:  cardiology: Dr. Gwenlyn Found  Patient coming [place]:  [place] at [place] with his wife and daughter   Chief Complaint: right rib pain/syncope.   HPI: Douglas Carr is a 56 y.o. 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 He states he was playing with his grandson in the bed and got up out of the bed. He sat on the side of the bed for a minute, stood up and started to walk toward the dresser and felt off. He was disoriented and thought his blood pressure was low. When he went to reach for his glasses he passed out and woke up to everyone picking him up off the floor. Maybe out for a few seconds. He did not urinate on himself, did not bite his tongue. He tells me his daughter tried to pick him up and he had some shaking and was unresponsive when they first went in there. He never stopped breathing. He was alert and oriented and wanted to go sit down in living room. He states he felt fine except his ribs on the right side hurt so bad he went to the ED. He states he usually can feel his blood pressure drop when he stands up. Has been going on for years.   Did run out of all of his medication for 2-3 days and just restarted them. Has also only been taking his coreg at night.   Recently seen by pcp on 05/24/21 for cough, CXR clear. Was treated for pneumonia on 05/17/21 by PCP with  levaquin, no cxr and cxr clear in December.   Does not smoke or drink, +chewing tobacco   ED Course: Vitals:, Blood pressure 107/51, heart rate 86, respiratory rate 18, oxygen 100% on room air. Pertinent labs: WBC 12.5, potassium 5.3, creatinine 1.54, BUN 45, troponin 25>24 CTH: No acute findings Right rib x-ray with no  acute fractures In ED orthostatic vitals were taken and were positive Cardiology was consulted, patient given 1 L bolus and injection of Toradol.  TRH was asked to admit.  Review of Systems: As per HPI; otherwise review of systems reviewed and negative.   Ambulatory Status:  Ambulates without assistance   Past Medical History:  Diagnosis Date   Acute ST elevation myocardial infarction (STEMI) involving left circumflex coronary artery ([place]) 05/07/2016   PCI to Cx-OM   Anginal pain (HCC)    secondary to sm. vessel disease   Anxiety    Arthritis    BIL KNEE PAIN AND BIL ANKLE PAIN   Bone spur of ankle    Cardiac arrest ([place]) 05/24/2016   with v fib   Chronic combined systolic and diastolic CHF, NYHA class 2 and ACA/AHA stage C 05/13/2016   Coronary artery disease involving native coronary artery of native heart with angina pectoris ([place]) 05/24/2016   Remote MI at 56 years of age, Last cath 2010-diffuse non-obstructive disease; last echo 06/16/08 -normal LV function, moderate concentric hypertrophy; nuc 08/2008 no ischemia;  medical therapy; STEMI May 07 2016 - PCI to Cx-OM   Diabetes mellitus    ON ORAL MEDICATION AND INSULIN   Dilated cardiomyopathy ([place]) 05/24/2016   EF 325-30% by Echo post STEMI (previously 30-35%)    Hyperlipidemia  Hypertension    Myocardial infarction [facility]) 1996   Post MI   Peripheral vascular disease ([place])    HAS LEFT CAROTID ARTERY STENOSIS   AND IS S/P RIGHT CAROTID ENDARTERECTOMY 2010 Last carotid dopplers 01/08/2012 wth patent endarterectomy site   Status post coronary artery stent placement     Past Surgical History:  Procedure Laterality Date   APPENDECTOMY     CARDIAC CATHETERIZATION     FEB 2010, significant branch vessel disease wth diag, marginal, PDA & PLA, nml. LV function   CARDIAC CATHETERIZATION N/A 05/07/2016   Procedure: Left Heart Cath and Coronary Angiography;  Surgeon: Peter M Martinique, MD;  Location: [place] [facility];  Service:  Cardiovascular;  Laterality: N/A;   CARDIAC CATHETERIZATION N/A 05/07/2016   Procedure: Coronary Stent Intervention;  Surgeon: Peter M Martinique, MD;  Location: [place] [facility];  Service: Cardiovascular;  Laterality: N/A;   CARDIAC CATHETERIZATION N/A 05/24/2016   Procedure: Left Heart Cath and Coronary Angiography;  Surgeon: [name], MD;  Location: [place] [facility];  Service: Cardiovascular;  Laterality: N/A;   CARDIAC CATHETERIZATION N/A 05/24/2016   Procedure: Coronary Stent Intervention;  Surgeon: [name], MD;  Location: [place] [facility];  Service: Cardiovascular;  Laterality: N/A;  2.5x20 Promus to Ostial/proximal circumflex   CAROTID ENDARTERECTOMY  09/2008   Rt CEA   CLIPPING OF ATRIAL APPENDAGE Left 01/27/2020   Procedure: CLIPPING OF ATRIAL APPENDAGE USING ATRICURE 78 MM ATRICLIP;  Surgeon: Wonda Olds, MD;  Location: [place];  Service: Open Heart Surgery;  Laterality: Left;   CORONARY ARTERY BYPASS GRAFT N/A 01/27/2020   Procedure: CORONARY ARTERY BYPASS GRAFTING (CABG), ON PUMP, TIMES FIVE, USING BILATERAL MAMMARY ARTERIES, LEFT RADIAL ARTERY, AND ENDOSCOPICALLY HARVESTED RIGHT GREATER SAPHENOUS VEIN.;  Surgeon: Wonda Olds, MD;  Location: [place];  Service: Open Heart Surgery;  Laterality: N/A;  LIMA->LAD RIMA->D1 (free graft) LRA-> OM SVG-> PDA->PL   ENDOVEIN HARVEST OF GREATER SAPHENOUS VEIN Right 01/27/2020   Procedure: ENDOVEIN HARVEST OF GREATER SAPHENOUS VEIN;  Surgeon: Wonda Olds, MD;  Location: [place];  Service: Open Heart Surgery;  Laterality: Right;   ICD IMPLANT N/A 06/30/2016   Procedure: ICD Implant;  Surgeon: Will Meredith Leeds, MD;  Location: [place] [facility];  Service: Cardiovascular;  Laterality: N/A;   INTRAVASCULAR PRESSURE WIRE/FFR STUDY N/A 01/24/2020   Procedure: INTRAVASCULAR PRESSURE WIRE/FFR STUDY;  Surgeon: Belva Crome, MD;  Location: [place] [facility];  Service: Cardiovascular;  Laterality: N/A;   LUNG BIOPSY  2013   Bx's  suggest granulomatous dz.   RADIAL ARTERY HARVEST Left 01/27/2020   Procedure: RADIAL ARTERY HARVEST;  Surgeon: Wonda Olds, MD;  Location: [place];  Service: Open Heart Surgery;  Laterality: Left;   RIGHT/LEFT HEART CATH AND CORONARY ANGIOGRAPHY N/A 01/24/2020   Procedure: RIGHT/LEFT HEART CATH AND CORONARY ANGIOGRAPHY;  Surgeon: Belva Crome, MD;  Location: [place] [facility];  Service: Cardiovascular;  Laterality: N/A;   RT ANKLE   2013   TEE WITHOUT CARDIOVERSION N/A 01/27/2020   Procedure: TRANSESOPHAGEAL ECHOCARDIOGRAM (TEE);  Surgeon: Wonda Olds, MD;  Location: [place];  Service: Open Heart Surgery;  Laterality: N/A;    Social History   Socioeconomic History   Marital status: Married    Spouse name: [name]   Number of children: 3   Years of education: Not on file   Highest education level: Not on file  Occupational History   Occupation: Agricultural consultant for  [employer]    Employer: [employer]   Tobacco Use   Smoking status: Former    Packs/day: 2.00    Years: 22.00    Pack years: 44.00    Types: Cigarettes    Quit date: 10/23/2011    Years since quitting: 9.5   Smokeless tobacco: Current    Types: Snuff  Vaping Use   Vaping Use: Never used  Substance and Sexual Activity   Alcohol use: No   Drug use: No   Sexual activity: Not on file  Other Topics Concern   Not on file  Social History Narrative   Pt lives with family in [place], [place].   Social Determinants of Health   Financial Resource Strain: Not on file  Food Insecurity: Not on file  Transportation Needs: Not on file  Physical Activity: Not on file  Stress: Not on file  Social Connections: Not on file  Intimate Partner Violence: Not on file    No Known Allergies  Family History  Problem Relation Age of Onset   Hypertension Mother    Diabetes Mother    Hypertension Maternal Grandfather    Heart attack Paternal Grandfather    Heart attack Father 51    Prior to Admission medications   Medication Sig Start Date  End Date Taking? Authorizing Provider  ASPIRIN LOW DOSE 81 MG EC tablet TAKE ONE TABLET BY MOUTH DAILY 05/17/21  Yes [name], Fransisca Kaufmann, MD  atorvastatin (LIPITOR) 80 MG tablet Take 1 tablet (80 mg total) by mouth every evening. 08/06/20  Yes [name], Fransisca Kaufmann, MD  carvedilol (COREG) 6.25 MG tablet TAKE ONE TABLET BY MOUTH TWICE DAILY WITH MEALS 01/24/21  Yes Lorretta Harp, MD  clopidogrel (PLAVIX) 75 MG tablet TAKE ONE TABLET BY MOUTH IN THE EVENING 08/06/20  Yes [name], Fransisca Kaufmann, MD  DULoxetine (CYMBALTA) 60 MG capsule TAKE TWO CAPSULES BY MOUTH DAILY 05/17/21  Yes [name], Fransisca Kaufmann, MD  ENTRESTO 24-26 MG TAKE ONE TABLET BY MOUTH TWICE DAILY 09/27/20  Yes Lorretta Harp, MD  furosemide (LASIX) 40 MG tablet One tablet daily as needed for weight gain 3 lb in one day or 5 lb in 1 week or for swelling 01/24/21  Yes Lorretta Harp, MD  HYDROcodone bit-homatropine (HYCODAN) 5-1.5 MG/5ML syrup Take 5 mLs by mouth every 6 (six) hours as needed for cough. 05/24/21  Yes Hassell Done, Mary-Margaret, FNP  hydrOXYzine (ATARAX) 25 MG tablet TAKE ONE TABLET BY MOUTH THREE TIMES DAILY AS NEEDED 05/22/21  Yes [name], Fransisca Kaufmann, MD  isosorbide dinitrate (ISORDIL) 10 MG tablet TAKE ONE TABLET BY MOUTH TWICE DAILY 03/11/21  Yes Lorretta Harp, MD  mirtazapine (REMERON) 7.5 MG tablet Take 1 tablet (7.5 mg total) by mouth at bedtime. TAKE ONE TABLET BY MOUTH AT BEDTIME FOR DEPRESSION 08/06/20  Yes [name], Fransisca Kaufmann, MD  primidone (MYSOLINE) 50 MG tablet TAKE ONE TABLET BY MOUTH AT BEDTIME 05/17/21  Yes [name], Fransisca Kaufmann, MD  Semaglutide, 2 MG/DOSE, 8 MG/3ML SOPN Inject 2 mg as directed once a week. 11/08/20  Yes [name], Fransisca Kaufmann, MD  tamsulosin (FLOMAX) 0.4 MG CAPS capsule TAKE 1 CAPSULE DAILY IN THE EVENING 08/06/20  Yes [name], Fransisca Kaufmann, MD  topiramate (TOPAMAX) 50 MG tablet TAKE ONE TABLET BY MOUTH AT BEDTIME AS NEEDED 05/17/21  Yes [name], Fransisca Kaufmann, MD  albuterol (VENTOLIN HFA) 108 (90 Base) MCG/ACT  inhaler Inhale 2 puffs into the lungs every 6 (six) hours as needed for wheezing or shortness of breath.  04/10/21   Baruch Gouty, FNP  blood glucose meter kit and supplies KIT Inject 1 each into the skin 4 (four) times daily as needed. Dispense based on patient and insurance preference. Use up to four times daily as directed. (FOR ICD-9 250.00, 250.01). 06/30/19   [name], Fransisca Kaufmann, MD  glucose blood (ONETOUCH VERIO) test strip 1 each by Other route in the morning, at noon, in the evening, and at bedtime. use for testing 06/30/19   [name], Fransisca Kaufmann, MD  Insulin Pen Needle (B-D ULTRAFINE III SHORT PEN) 31G X 8 MM MISC USE TO INJECT INSULIN DAILY DX E11.9 02/21/20   [name], Fransisca Kaufmann, MD  levofloxacin (LEVAQUIN) 500 MG tablet Take 1 tablet (500 mg total) by mouth daily. 05/17/21   Chevis Pretty, FNP  OneTouch Delica Lancets 76B MISC Test blood sugars three times daily 09/30/19   [name], Fransisca Kaufmann, MD    Physical Exam: Vitals:   05/26/21 1850 05/26/21 1854 05/26/21 1855 05/26/21 2005  BP:  (!) 159/65  (!) 181/87  Pulse:  88 90 94  Resp:  (!) 23 20 18   Temp:    98.1 F (36.7 C)  TempSrc:    Oral  SpO2: 100% 99% 100% 100%  Weight:  86.7 kg  85.3 kg     General:  Appears calm and comfortable and is in NAD Eyes:  PERRL, EOMI, normal lids, iris ENT:  grossly normal hearing, lips & tongue, mmm; appropriate dentition Neck:  no LAD, masses or thyromegaly; no carotid bruits Cardiovascular:  RRR, no m/r/g. No LE edema.  Respiratory:   CTA bilaterally with no wheezes/rales/rhonchi.  Normal respiratory effort. Abdomen:  soft, NT, ND, NABS. Small umbilical hernia, reducible.  Back:   normal alignment, no CVAT Skin:  no rash or induration seen on limited exam Musculoskeletal:  grossly normal tone BUE/BLE, good ROM, no bony abnormality Lower extremity:  No LE edema.  Limited foot exam with no ulcerations.  2+ distal pulses. Psychiatric:  grossly normal mood and affect, speech fluent and  appropriate-mildly dysarthric, at baseline.  AOx3 Neurologic:  CN 2-12 grossly intact, moves all extremities in coordinated fashion, sensation intact    Radiological Exams on Admission: Independently reviewed - see discussion in A/P where applicable  DG Ribs Unilateral W/Chest Right  Result Date: 05/26/2021 CLINICAL DATA:  Syncope.  Fall.  Right rib pain. EXAM: RIGHT RIBS AND CHEST - 3+ VIEW COMPARISON:  None. FINDINGS: The lungs are clear without focal pneumonia, edema, pneumothorax or pleural effusion. Calcified granulomata again noted right middle lobe as seen on CT chest 04/12/2012. Sclerotic lesion anterior right third rib also stable since prior CT. No evidence for pneumothorax or pleural effusion. Pacer/AICD noted. The cardiopericardial silhouette is within normal limits for size. Oblique views of the right ribs show no evidence for displaced acute right-sided rib fracture. IMPRESSION: 1. No acute cardiopulmonary findings. 2. No displaced acute right-sided rib fracture. 3. Stable calcified granulomata right middle lobe and sclerotic lesion anterior right third rib. Electronically Signed   By: Misty Stanley M.D.   On: 05/26/2021 12:08   CT Head Wo Contrast  Result Date: 05/26/2021 CLINICAL DATA:  Patient with syncope.  Fall. EXAM: CT HEAD WITHOUT CONTRAST TECHNIQUE: Contiguous axial images were obtained from the base of the skull through the vertex without intravenous contrast. RADIATION DOSE REDUCTION: This exam was performed according to the departmental dose-optimization program which includes automated exposure control, adjustment of the mA and/or kV according to patient size and/or use of  iterative reconstruction technique. COMPARISON:  Brain CT 05/20/2017. FINDINGS: Brain: Ventricles and sulci are prominent compatible with atrophy. Periventricular and subcortical white matter hypodensities compatible with chronic microvascular ischemic changes. No evidence for acute cortically based  infarct, intracranial hemorrhage, mass lesion or mass-effect. Vascular: Unremarkable Skull: Intact Sinuses/Orbits: Paranasal sinuses are well aerated. Mastoid air cells are unremarkable. Other: None. IMPRESSION: No acute intracranial process. Atrophy and chronic microvascular ischemic changes. Electronically Signed   By: Lovey Newcomer M.D.   On: 05/26/2021 11:55    EKG: Independently reviewed.  NSR with rate 80; nonspecific ST changes with no evidence of acute ischemia. ST changes seen on previous ekg    Labs on Admission: I have personally reviewed the available labs and imaging studies at the time of the admission.  Pertinent labs:  WBC 12.5,  potassium 5.3,  creatinine 1.54, (1.4-1.89),  BUN 45,  troponin 25>24   Assessment/Plan * Syncope and collapse- (present on admission) 56 year old with episode of syncope and collapse likely secondary to orthostasis -obs to telemetry -orthostatics have been + x 2 -no signs of intravascular depletion/dehydration. Received 1L bolus in ED. Hold fluid in light of combined CHF ? More autonomic dysfunction vs. Med related  -TED hose -echo and continue telemetry -known bilateral carotid artery disease, carotid dopplers ordered.  -blood pressure medication being adjusted and will need to continue to be titrated-holding isordil, decrease coreg see below.  -cardiology to see with complicated cardiac history   Essential hypertension- (present on admission) Blood pressure labile with +orthostasis Was off medication x 2-3 days, but has had this feeling of presyncope with standing for years -continue entresto -decrease coreg to 3.189m bid, has only been taking his 6.254mdaily -hold imdur  -treat pain  -cardiology to see   Rib pain on right side Secondary to fall, no fracture seen on xray Pain medication with topical voltaren, tylenol, oxycodone and then small amount of morphine for severe pain If pain persists, would CT IS on d/c   Coronary  artery disease involving native coronary artery of native heart with angina pectoris ([place] (present on admission) -hx of OM2 PCI in setting of STEMI in [id number] by edge dissection-VF- CPR-Treated with CFX PCI/DES CABG x 5 Sept 2021 -continue high dose statin, DAPT, coreg  -chest pain free -holding isordil in setting of orthostatic hypotension    Chronic combined systolic and diastolic HF (heart failure), 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- (present on admission) No signs of volume overload and appears euvolemic Intake/output and daily weight Last echo in 9/21 with TEE EF estimated to be 40-45%. On echo 9/21 grade 3 dd Continue entresto, reduced coreg. Held isordil tonight -ICD 2018    Insulin dependent type 2 diabetes mellitus ([facility]CLincolnPoorly controlled diabetes a1c in 7/22 was 9.4 Unsure of medication, currently on ozempic only.  Was on tresiba and took "really high doses" now uses only to "get sugar under control" -starting him on levemir 8 units and SSI -likely needs long acting insulin in addition to ozempic -accucheks and SSI per protocol.   Bilateral carotid artery disease ([place] (present on admission) - S/p right CEA in 2010 with subsequent occlusion. Last carotid dopplers in 01/2020 showed known total occlusion of right ICA and 40-59% stenosis of left ICA as well as occlusion of right vertebral artery.  - Continue DAPT and statin. -due for repeat imagine gin 9/22 and has not had this done -carotid dopplers ordered   CKD (chronic kidney disease), stage III ([place]  He seems unaware of this, but renal function has been elevated for over a year at least Baseline creatinine: 1.4-1.8 At baseline, BUN slightly elevated and could be from syncope Potassium slightly elevated 1L IVF in ED, repeat bmp tonight Trend renal function I/O  UA with high glucose  Dyslipidemia, goal LDL below 70- (present on admission) Continue statin   Anxiety and depression- (present  on admission) Continue cymbalta, remeron and hydroxyzine  Pulmonary nodules- (present on admission) Follow outpatient   Tremor- (present on admission) Continue mysoline and topomax prn       Body mass index is 26.98 kg/m.    Level of care: Telemetry Cardiac DVT prophylaxis:  Lovenox  Code Status:  Full - confirmed with patient/family Family Communication: wife at bedside: Rebekah Sprinkle Disposition Plan:  The patient is from: home  Anticipated d/c is to: home   Patient placed in observation as anticipate less than 2 midnight stay. Requires hospitalization for syncope and collapse in setting of known CAD/CHF/carotid artery disease and orthostatic hypotension. Requires work up, constant monitoring and assessment as it is not safe for him to return home at this time.      Patient is currently:stable  Consults called: cardiology by edp  Admission status:  observation    Orma Flaming MD [facility]   How to contact the [facility] Attending or Consulting provider [place] or covering provider during after hours [place], for this patient?  Check the care team in [place] and look for a) attending/consulting [facility] provider listed and b) the [facility] team listed Log into [email or web address] and use [place]'s universal password to access. If you do not have the password, please contact the hospital operator. Locate the [facility] provider you are looking for under [facility] and page to a number that you can be directly reached. If you still have difficulty reaching the provider, please page the [facility] (Director on Call) for the Hospitalists listed on amion for assistance.   05/26/2021, 8:31 PM

## 2021-05-27 ENCOUNTER — Ambulatory Visit: Payer: Medicare PPO

## 2021-05-27 ENCOUNTER — Ambulatory Visit: Payer: Medicare Other | Admitting: Family Medicine

## 2021-05-27 ENCOUNTER — Observation Stay (HOSPITAL_BASED_OUTPATIENT_CLINIC_OR_DEPARTMENT_OTHER): Payer: Medicare PPO

## 2021-05-27 ENCOUNTER — Observation Stay (HOSPITAL_COMMUNITY): Payer: Medicare PPO

## 2021-05-27 DIAGNOSIS — R0781 Pleurodynia: Secondary | ICD-10-CM | POA: Diagnosis not present

## 2021-05-27 DIAGNOSIS — R55 Syncope and collapse: Secondary | ICD-10-CM | POA: Diagnosis not present

## 2021-05-27 DIAGNOSIS — R911 Solitary pulmonary nodule: Secondary | ICD-10-CM | POA: Diagnosis not present

## 2021-05-27 DIAGNOSIS — Z20822 Contact with and (suspected) exposure to covid-19: Secondary | ICD-10-CM | POA: Diagnosis not present

## 2021-05-27 LAB — BASIC METABOLIC PANEL
Anion gap: 4 — ABNORMAL LOW (ref 5–15)
BUN: 39 mg/dL — ABNORMAL HIGH (ref 6–20)
CO2: 22 mmol/L (ref 22–32)
Calcium: 8.4 mg/dL — ABNORMAL LOW (ref 8.9–10.3)
Chloride: 109 mmol/L (ref 98–111)
Creatinine, Ser: 1.36 mg/dL — ABNORMAL HIGH (ref 0.61–1.24)
GFR, Estimated: >60 mL/min (ref >60)
Glucose, Bld: 224 mg/dL — ABNORMAL HIGH (ref 70–99)
Potassium: 4.6 mmol/L (ref 3.5–5.1)
Sodium: 135 mmol/L (ref 135–145)

## 2021-05-27 LAB — ECHOCARDIOGRAM COMPLETE
AR max vel: 3.64 cm2
AV Area VTI: 3.1 cm2
AV Area mean vel: 2.42 cm2
AV Mean grad: 2 mmHg
AV Peak grad: 2.5 mmHg
Ao pk vel: 0.79 m/s
Area-P 1/2: 3.99 cm2
Calc EF: 33.7 %
Height: 70 in
S' Lateral: 4.2 cm
Single Plane A2C EF: 36.1 %
Single Plane A4C EF: 36.3 %
Weight: 3093.49 oz

## 2021-05-27 LAB — GLUCOSE, CAPILLARY
Glucose-Capillary: 148 mg/dL — ABNORMAL HIGH (ref 70–99)
Glucose-Capillary: 176 mg/dL — ABNORMAL HIGH (ref 70–99)
Glucose-Capillary: 193 mg/dL — ABNORMAL HIGH (ref 70–99)
Glucose-Capillary: 201 mg/dL — ABNORMAL HIGH (ref 70–99)
Glucose-Capillary: 244 mg/dL — ABNORMAL HIGH (ref 70–99)
Glucose-Capillary: 99 mg/dL (ref 70–99)

## 2021-05-27 LAB — TROPONIN I (HIGH SENSITIVITY): Troponin I (High Sensitivity): 30 ng/L — ABNORMAL HIGH (ref <18)

## 2021-05-27 LAB — CBC
HCT: 40.7 % (ref 39.0–52.0)
Hemoglobin: 13.5 g/dL (ref 13.0–17.0)
MCH: 28 pg (ref 26.0–34.0)
MCHC: 33.2 g/dL (ref 30.0–36.0)
MCV: 84.3 fL (ref 80.0–100.0)
Platelets: 190 10*3/uL (ref 150–400)
RBC: 4.83 MIL/uL (ref 4.22–5.81)
RDW: 12.7 % (ref 11.5–15.5)
WBC: 16.8 10*3/uL — ABNORMAL HIGH (ref 4.0–10.5)
nRBC: 0 % (ref 0.0–0.2)

## 2021-05-27 MED ORDER — CARVEDILOL 3.125 MG PO TABS: 3.1250 mg | ORAL_TABLET | Freq: Two times a day (BID) | ORAL | Status: DC

## 2021-05-27 MED ORDER — SODIUM CHLORIDE 0.9 % IV BOLUS
500.0000 mL | Freq: Once | INTRAVENOUS | Status: AC
Start: 2021-05-27 — End: 2021-05-27
  Administered 2021-05-27: 500 mL via INTRAVENOUS

## 2021-05-27 MED ORDER — PERFLUTREN LIPID MICROSPHERE
1.0000 mL | INTRAVENOUS | Status: AC | PRN
  Administered 2021-05-27: 2 mL via INTRAVENOUS
  Filled 2021-05-27: qty 10

## 2021-05-27 MED ORDER — IOHEXOL 350 MG/ML SOLN
75.0000 mL | Freq: Once | INTRAVENOUS | Status: AC | PRN
  Administered 2021-05-27: 75 mL via INTRAVENOUS

## 2021-05-27 MED ORDER — SACUBITRIL-VALSARTAN 24-26 MG PO TABS: 1.0000 | ORAL_TABLET | Freq: Two times a day (BID) | ORAL | Status: DC

## 2021-05-27 MED ORDER — CARVEDILOL 6.25 MG PO TABS
6.2500 mg | ORAL_TABLET | Freq: Two times a day (BID) | ORAL | Status: DC
  Administered 2021-05-27: 6.25 mg via ORAL
  Filled 2021-05-27: qty 1

## 2021-05-27 NOTE — Progress Notes (Signed)
PROGRESS NOTE    Douglas Carr   [id number]  DOB: 04/15/1966  DOA: 05/26/2021 PCP: [name], [name], MD   Brief Narrative:  Douglas Carr is a 56 year old man with coronary artery disease status post right carotid endarterectomy and persistent left carotid stenosis, hypertension coronary artery disease status post CABG, combined systolic and diastolic heart failure with ICD diabetes mellitus, chronic kidney disease who presents to the hospital for a syncopal episode upon waking up and walking to the dresser. Initial BP reading in the ED was 107/51.  He states that he was out of his medications for few days but just restarted them.  Subjective: He has no complaints.    Assessment & Plan:   Principal Problem:   Syncope and collapse -Given IV fluids-although orthostatic vitals are still positive, he has ambulated 450 feet per the mobility tech note - Carotid ultrasound has been ordered and shows chronic right-sided stenosis but slightly increased left-sided stenosis which is 60 to 79%. - I have asked vascular for an opinion - ?  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), 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 -The echo has been repeated and does not show any change from prior echo-EF is 30 to 35% -See above regarding medications    Coronary artery disease involving native coronary artery of native heart with angina pectoris (HCC) Peripheral vascular disease -prior carotid endarterectomy -Continue aspirin and Plavix  Diabetes mellitus, not stated as uncontrolled - Semaglutide is on hold   CKD (chronic kidney disease), stage IIIb -Creatinine stable   DVT prophylaxis:  Ambulatory Code Status: Full code Level of Care: Level of care: Telemetry Cardiac Disposition Plan:  Status is: Observation   Antimicrobials:  Anti-infectives (From admission, onward)    None        Objective: Vitals:   05/27/21 0036 05/27/21 0109 05/27/21 0332 05/27/21 0429  BP: (!) 182/75 139/63 (!) 158/82   Pulse: 86 76 85 74  Resp: 19  19 14   Temp: 97.7 F (36.5 C)  (!) 97.5 F (36.4 C)   TempSrc: Oral  Oral   SpO2: 100%  99% 97%  Weight:    87.7 kg  Height:        Intake/Output Summary (Last 24 hours) at 05/27/2021 1614 Last data filed at 05/26/2021 2150 Gross per 24 hour  Intake 240 ml  Output 400 ml  Net -160 ml   Filed Weights   05/26/21 1854 05/26/21 2005 05/27/21 0429  Weight: 86.7 kg 85.3 kg 87.7 kg    Examination: General exam: Appears comfortable  HEENT: PERRLA, oral mucosa moist, no sclera icterus or thrush Respiratory system: Clear to auscultation. Respiratory effort normal. Cardiovascular system: S1 & S2 heard, RRR.   Gastrointestinal system: Abdomen soft, non-tender, nondistended. Normal bowel sounds. Central nervous system: Alert and oriented. No focal neurological deficits. Extremities: No cyanosis, clubbing or edema Skin: No rashes or ulcers Psychiatry:  Mood & affect appropriate.     Data Reviewed: I have personally reviewed following labs and imaging studies  CBC: Recent Labs  Lab 05/26/21 1106 05/27/21 0303  WBC 12.5* 16.8*  HGB 14.1 13.5  HCT 43.4 40.7  MCV 84.3 84.3  PLT  [id number]   Basic Metabolic Panel: Recent Labs  Lab 05/26/21 1106 05/26/21 1957 05/27/21 0303  NA 135 141 135  K 5.3* 6.3* 4.6  CL 104 108 109  CO2 22 24 22   GLUCOSE 323* 225* 224*  BUN 45* 41* 39*  CREATININE 1.54* 1.54* 1.36*  CALCIUM 9.0 9.1 8.4*   GFR: Estimated Creatinine Clearance: 69.3 mL/min (A) (by C-G formula based on SCr of 1.36 mg/dL (H)). Liver Function Tests: No results for input(s): AST, ALT, ALKPHOS, BILITOT, PROT, ALBUMIN in  the last 168 hours. No results for input(s): LIPASE, AMYLASE in the last 168 hours. No results for input(s): AMMONIA in the last 168 hours. Coagulation Profile: No results for input(s): INR, PROTIME in the last 168 hours. Cardiac Enzymes: No results for input(s): CKTOTAL, CKMB, CKMBINDEX, TROPONINI in the last 168 hours. BNP (last 3 results) No results for input(s): PROBNP in the last 8760 hours. HbA1C: No results for input(s): HGBA1C in the last 72 hours. CBG: Recent Labs  Lab 05/26/21 1706 05/26/21 2218 05/27/21 0040 05/27/21 0741 05/27/21 1238  GLUCAP 246* 193* 99 176* 244*   Lipid Profile: No results for input(s): CHOL, HDL, LDLCALC, TRIG, CHOLHDL, LDLDIRECT in the last 72 hours. Thyroid Function Tests: No results for input(s): TSH, T4TOTAL, FREET4, T3FREE, THYROIDAB in the last 72 hours. Anemia Panel: No results for input(s): VITAMINB12, FOLATE, FERRITIN, TIBC, IRON, RETICCTPCT in the last 72 hours. Urine analysis:    Component Value Date/Time   COLORURINE YELLOW 05/26/2021 1415   APPEARANCEUR CLEAR 05/26/2021 1415   LABSPEC 1.017 05/26/2021 1415   PHURINE 6.5 05/26/2021 1415   GLUCOSEU >1,000 (A) 05/26/2021 1415   HGBUR NEGATIVE 05/26/2021 1415   BILIRUBINUR NEGATIVE 05/26/2021 1415   KETONESUR NEGATIVE 05/26/2021 1415   PROTEINUR 30 (A) 05/26/2021 1415   UROBILINOGEN 0.2 08/19/2011 2051   NITRITE NEGATIVE 05/26/2021 1415   LEUKOCYTESUR NEGATIVE 05/26/2021 1415   Sepsis Labs: @LABRCNTIP (procalcitonin:4,lacticidven:4) ) Recent Results (from the past 240 hour(s))  Resp Panel by RT-PCR (Flu A&B, Covid) Nasopharyngeal Swab     Status: None   Collection Time: 05/26/21  2:00 PM   Specimen: Nasopharyngeal Swab; Nasopharyngeal(NP) swabs in vial transport medium  Result Value Ref Range Status   SARS Coronavirus 2 by RT PCR NEGATIVE NEGATIVE Final    Comment: (NOTE) SARS-CoV-2 target nucleic acids are NOT DETECTED.  The SARS-CoV-2 RNA is generally detectable in  upper respiratory specimens during the acute phase of infection. The lowest concentration of SARS-CoV-2 viral copies this assay can detect is 138 copies/mL. A negative result does not preclude SARS-Cov-2 infection and should not be used as the sole basis for treatment or other patient management decisions. A negative result may occur with  improper specimen collection/handling, submission of specimen other than nasopharyngeal swab, presence of viral mutation(s) within the areas targeted by this assay, and inadequate number of viral copies(<138 copies/mL). A negative result must be combined with clinical observations, patient history, and epidemiological information. The expected result is Negative.  Fact Sheet for Patients:  EntrepreneurPulse.com.au  Fact Sheet for Healthcare Providers:  IncredibleEmployment.be  This test is no t yet approved or cleared by the [place] FDA and  has been authorized for detection and/or diagnosis of SARS-CoV-2 by FDA under an Emergency Use Authorization (EUA). This EUA will remain  in effect (meaning this test can be used) for the duration of the COVID-19 declaration under Section 564(b)(1) of the Act, 21 U.S.C.section 360bbb-3(b)(1), unless the authorization is terminated  or revoked sooner.  Influenza A by PCR NEGATIVE NEGATIVE Final   Influenza B by PCR NEGATIVE NEGATIVE Final    Comment: (NOTE) The Xpert Xpress SARS-CoV-2/FLU/RSV plus assay is intended as an aid in the diagnosis of influenza from Nasopharyngeal swab specimens and should not be used as a sole basis for treatment. Nasal washings and aspirates are unacceptable for Xpert Xpress SARS-CoV-2/FLU/RSV testing.  Fact Sheet for Patients: EntrepreneurPulse.com.au  Fact Sheet for Healthcare Providers: IncredibleEmployment.be  This test is not yet approved or cleared by the [place] FDA and has been  authorized for detection and/or diagnosis of SARS-CoV-2 by FDA under an Emergency Use Authorization (EUA). This EUA will remain in effect (meaning this test can be used) for the duration of the COVID-19 declaration under Section 564(b)(1) of the Act, 21 U.S.C. section 360bbb-3(b)(1), unless the authorization is terminated or revoked.  Performed at [facility], [address] [place], [place] [address]          Radiology Studies: DG Ribs Unilateral W/Chest Right  Result Date: 05/26/2021 CLINICAL DATA:  Syncope.  Fall.  Right rib pain. EXAM: RIGHT RIBS AND CHEST - 3+ VIEW COMPARISON:  None. FINDINGS: The lungs are clear without focal pneumonia, edema, pneumothorax or pleural effusion. Calcified granulomata again noted right middle lobe as seen on CT chest 04/12/2012. Sclerotic lesion anterior right third rib also stable since prior CT. No evidence for pneumothorax or pleural effusion. Pacer/AICD noted. The cardiopericardial silhouette is within normal limits for size. Oblique views of the right ribs show no evidence for displaced acute right-sided rib fracture. IMPRESSION: 1. No acute cardiopulmonary findings. 2. No displaced acute right-sided rib fracture. 3. Stable calcified granulomata right middle lobe and sclerotic lesion anterior right third rib. Electronically Signed   By: Misty Stanley M.D.   On: 05/26/2021 12:08   CT Head Wo Contrast  Result Date: 05/26/2021 CLINICAL DATA:  Patient with syncope.  Fall. EXAM: CT HEAD WITHOUT CONTRAST TECHNIQUE: Contiguous axial images were obtained from the base of the skull through the vertex without intravenous contrast. RADIATION DOSE REDUCTION: This exam was performed according to the departmental dose-optimization program which includes automated exposure control, adjustment of the mA and/or kV according to patient size and/or use of iterative reconstruction technique. COMPARISON:  Brain CT 05/20/2017. FINDINGS: Brain:  Ventricles and sulci are prominent compatible with atrophy. Periventricular and subcortical white matter hypodensities compatible with chronic microvascular ischemic changes. No evidence for acute cortically based infarct, intracranial hemorrhage, mass lesion or mass-effect. Vascular: Unremarkable Skull: Intact Sinuses/Orbits: Paranasal sinuses are well aerated. Mastoid air cells are unremarkable. Other: None. IMPRESSION: No acute intracranial process. Atrophy and chronic microvascular ischemic changes. Electronically Signed   By: Lovey Newcomer M.D.   On: 05/26/2021 11:55   ECHOCARDIOGRAM COMPLETE  Result Date: 05/27/2021    ECHOCARDIOGRAM REPORT   Patient Name:   CAI PHIPPS Date of Exam: 05/27/2021 Medical Rec #:  [id number]           Height:       70.0 in Accession #:    [id number]          Weight:       193.3 lb Date of Birth:  1965-11-12           BSA:          2.058 m Patient Age:    34 years            BP:           158/82 mmHg  Patient Gender: M                   HR:           66 bpm. Exam Location:  Inpatient Procedure: 2D Echo, Cardiac Doppler and Color Doppler Indications:    R55 Syncope  History:        Patient has prior history of Echocardiogram examinations, most                 recent 01/23/2020. Cardiomyopathy, CAD and Previous Myocardial                 Infarction, Defibrillator, Signs/Symptoms:Syncope; Risk                 Factors:Hypertension and Dyslipidemia.  Sonographer:    Glo Herring Referring Phys: [id number] Athalia  1. Global hypokinesis worse in the inferior base . Left ventricular ejection fraction, by estimation, is 30 to 35%. The left ventricle has moderately decreased function. The left ventricle has no regional wall motion abnormalities. The left ventricular internal cavity size was moderately dilated. Left ventricular diastolic parameters were normal.  2. Right ventricular systolic function is normal. The right ventricular size is normal.  3. Left atrial  size was mildly dilated.  4. The mitral valve is abnormal. No evidence of mitral valve regurgitation. No evidence of mitral stenosis.  5. The aortic valve is tricuspid. There is mild calcification of the aortic valve. Aortic valve regurgitation is not visualized. Aortic valve sclerosis is present, with no evidence of aortic valve stenosis.  6. The inferior vena cava is normal in size with greater than 50% respiratory variability, suggesting right atrial pressure of 3 mmHg. FINDINGS  Left Ventricle: Global hypokinesis worse in the inferior base. Left ventricular ejection fraction, by estimation, is 30 to 35%. The left ventricle has moderately decreased function. The left ventricle has no regional wall motion abnormalities. Definity contrast agent was given IV to delineate the left ventricular endocardial borders. The left ventricular internal cavity size was moderately dilated. There is no left ventricular hypertrophy. Left ventricular diastolic parameters were normal. Right Ventricle: The right ventricular size is normal. No increase in right ventricular wall thickness. Right ventricular systolic function is normal. Left Atrium: Left atrial size was mildly dilated. Right Atrium: Right atrial size was normal in size. Pericardium: There is no evidence of pericardial effusion. Mitral Valve: The mitral valve is abnormal. There is mild thickening of the mitral valve leaflet(s). There is mild calcification of the mitral valve leaflet(s). No evidence of mitral valve regurgitation. No evidence of mitral valve stenosis. Tricuspid Valve: The tricuspid valve is normal in structure. Tricuspid valve regurgitation is not demonstrated. No evidence of tricuspid stenosis. Aortic Valve: The aortic valve is tricuspid. There is mild calcification of the aortic valve. Aortic valve regurgitation is not visualized. Aortic valve sclerosis is present, with no evidence of aortic valve stenosis. Aortic valve mean gradient measures 2.0 mmHg.  Aortic valve peak gradient measures 2.5 mmHg. Aortic valve area, by VTI measures 3.10 cm. Pulmonic Valve: The pulmonic valve was normal in structure. Pulmonic valve regurgitation is not visualized. No evidence of pulmonic stenosis. Aorta: The aortic root is normal in size and structure. Venous: The inferior vena cava is normal in size with greater than 50% respiratory variability, suggesting right atrial pressure of 3 mmHg. IAS/Shunts: No atrial level shunt detected by color flow Doppler.  LEFT VENTRICLE PLAX 2D LVIDd:         5.05 cm  Diastology LVIDs:         4.20 cm      LV e' medial:    4.13 cm/s LV PW:         0.95 cm      LV E/e' medial:  11.6 LV IVS:        0.90 cm      LV e' lateral:   7.22 cm/s LVOT diam:     2.25 cm      LV E/e' lateral: 6.6 LV SV:         45 LV SV Index:   22 LVOT Area:     3.98 cm  LV Volumes (MOD) LV vol d, MOD A2C: 109.0 ml LV vol d, MOD A4C: 137.0 ml LV vol s, MOD A2C: 69.6 ml LV vol s, MOD A4C: 87.3 ml LV SV MOD A2C:     39.4 ml LV SV MOD A4C:     137.0 ml LV SV MOD BP:      41.6 ml RIGHT VENTRICLE            IVC RV Basal diam:  3.40 cm    IVC diam: 1.00 cm RV Mid diam:    2.20 cm RV S prime:     7.62 cm/s LEFT ATRIUM             Index        RIGHT ATRIUM           Index LA diam:        3.65 cm 1.77 cm/m   RA Area:     15.30 cm LA Vol (A2C):   68.8 ml 33.44 ml/m  RA Volume:   36.10 ml  17.55 ml/m LA Vol (A4C):   50.3 ml 24.45 ml/m LA Biplane Vol: 60.0 ml 29.16 ml/m  AORTIC VALVE                    PULMONIC VALVE AV Area (Vmax):    3.64 cm     PV Vmax:       0.82 m/s AV Area (Vmean):   2.42 cm     PV Peak grad:  2.7 mmHg AV Area (VTI):     3.10 cm AV Vmax:           79.30 cm/s AV Vmean:          61.500 cm/s AV VTI:            0.146 m AV Peak Grad:      2.5 mmHg AV Mean Grad:      2.0 mmHg LVOT Vmax:         72.50 cm/s LVOT Vmean:        37.400 cm/s LVOT VTI:          0.114 m LVOT/AV VTI ratio: 0.78  AORTA Ao Root diam: 3.25 cm Ao Asc diam:  3.00 cm MITRAL VALVE MV Area  (PHT): 3.99 cm    SHUNTS MV Decel Time: 190 msec    Systemic VTI:  0.11 m MV E velocity: 48.00 cm/s  Systemic Diam: 2.25 cm MV A velocity: 42.40 cm/s MV E/A ratio:  1.13 Jenkins Rouge MD Electronically signed by Jenkins Rouge MD Signature Date/Time: 05/27/2021/9:50:41 AM    Final    VAS US CAROTID  Result Date: 05/27/2021 Carotid Arterial Duplex Study Patient Name:  CHIJIOKE LUNGREN [facility]  Date of Exam:   05/27/2021 Medical Rec #: [id number]  Accession #:    [id number] Date of Birth: July 04, 1965            Patient Gender: M Patient Age:   32 years Exam Location:  [facility] Procedure:      VAS US CAROTID Referring Phys: Orma Flaming --------------------------------------------------------------------------------  Indications:       Syncope. Risk Factors:      Hypertension, hyperlipidemia, Diabetes. Comparison Study:  prior 01/26/20 rt occluded ICA lt ICA 40-59% Performing Technologist: Archie Patten RVS  Examination Guidelines: A complete evaluation includes B-mode imaging, spectral Doppler, color Doppler, and power Doppler as needed of all accessible portions of each vessel. Bilateral testing is considered an integral part of a complete examination. Limited examinations for reoccurring indications may be performed as noted.  Right Carotid Findings: +----------+--------+--------+--------+------------------+--------+             PSV cm/s EDV cm/s Stenosis Plaque Description Comments  +----------+--------+--------+--------+------------------+--------+  CCA Prox                     Occluded                              +----------+--------+--------+--------+------------------+--------+  CCA Mid                      Occluded                              +----------+--------+--------+--------+------------------+--------+  CCA Distal                   Occluded                              +----------+--------+--------+--------+------------------+--------+  ICA Prox                     Occluded                               +----------+--------+--------+--------+------------------+--------+  ICA Mid                      Occluded                              +----------+--------+--------+--------+------------------+--------+  ICA Distal                   Occluded                              +----------+--------+--------+--------+------------------+--------+  ECA        117      24                                             +----------+--------+--------+--------+------------------+--------+ +----------+--------+-------+--------+-------------------+             PSV cm/s EDV cms Describe Arm Pressure (mmHG)  +----------+--------+-------+--------+-------------------+  Subclavian 60                                             +----------+--------+-------+--------+-------------------+ +---------+--------+--------+--------------+  Vertebral PSV cm/s EDV cm/s Not identified  +---------+--------+--------+--------------+  Left Carotid Findings: +---------+--------+-------+--------+---------------------------------+--------+            PSV cm/s EDV     Stenosis Plaque Description                Comments                      cm/s                                                         +---------+--------+-------+--------+---------------------------------+--------+  CCA Prox  117      25               heterogenous                                +---------+--------+-------+--------+---------------------------------+--------+  CCA       93       20               heterogenous and calcific                    Distal                                                                          +---------+--------+-------+--------+---------------------------------+--------+  ICA Prox  185      78      60-79%   heterogenous, calcific and                                                       irregular                                   +---------+--------+-------+--------+---------------------------------+--------+  ICA Mid   202      77                                                            +---------+--------+-------+--------+---------------------------------+--------+  ICA       174      58                                                            Distal                                                                          +---------+--------+-------+--------+---------------------------------+--------+  ECA       144      19                                                           +---------+--------+-------+--------+---------------------------------+--------+ +----------+--------+--------+--------+-------------------+             PSV cm/s EDV cm/s Describe Arm Pressure (mmHG)  +----------+--------+--------+--------+-------------------+  Subclavian 212                                             +----------+--------+--------+--------+-------------------+ +---------+--------+--------+--------------+  Vertebral PSV cm/s EDV cm/s Not identified  +---------+--------+--------+--------------+   Summary: Right Carotid: Evidence consistent with a total occlusion of the right ICA. Left Carotid: Velocities in the left ICA are consistent with a 60-79% stenosis. Vertebrals: Bilateral vertebral arteries were not visualized. *See table(s) above for measurements and observations.     Preliminary       Scheduled Meds:  aspirin EC  81 mg Oral Daily   atorvastatin  80 mg Oral QPM   carvedilol  3.125 mg Oral Q12H   clopidogrel  75 mg Oral QPM   diclofenac Sodium  2 g Topical QID   DULoxetine  120 mg Oral Daily   enoxaparin (LOVENOX) injection  40 mg Subcutaneous Q24H   insulin aspart  0-9 Units Subcutaneous TID WC   insulin glargine-yfgn  8 Units Subcutaneous QHS   mirtazapine  7.5 mg Oral QHS   primidone  50 mg Oral QHS   sacubitril-valsartan  1 tablet Oral BID   sodium chloride flush  3 mL Intravenous Q12H   sodium chloride flush  3 mL Intravenous Q12H   tamsulosin  0.4 mg Oral QPC supper   Continuous Infusions:  sodium chloride       LOS:  0 days      Debbe Odea, MD [facility] Pager: [email or web address] 05/27/2021, 4:14 PM

## 2021-05-27 NOTE — Progress Notes (Signed)
2105: Critical Value: Potassium 6.3. Notified on-call provider.  2250: Received orders for STAT EKG, 10 unit Novolog intravenously, 25 mL of D50, Calcium Gluconate, and Lokelma. Repeat Potassium and EKG 1 hr after admin. Will notify provider with results.   [id number]: Notified on-call provider that meds were given. EKG completed. Waiting on lab results.   Potassium 4.6.

## 2021-05-27 NOTE — Progress Notes (Signed)
Carotid duplex has been completed.   Preliminary results in CV Proc.   Douglas Carr Douglas Carr 05/27/2021 11:03 AM

## 2021-05-27 NOTE — Care Management Obs Status (Signed)
MEDICARE OBSERVATION STATUS NOTIFICATION   Patient Details  Name: Douglas Carr MRN: [id number] Date of Birth: [birth year]   Medicare Observation Status Notification Given:  Yes    Gala Lewandowsky, RN 05/27/2021, 4:56 PM

## 2021-05-27 NOTE — Progress Notes (Signed)
Mobility Specialist Progress Note    05/27/21 1229  Orthostatic Lying   BP- Lying 149/83  Orthostatic Sitting  BP- Sitting 111/79  Orthostatic Standing at 0 minutes  BP- Standing at 0 minutes 108/54  Pulse- Standing at 0 minutes 97  Orthostatic Standing at 3 minutes  BP- Standing at 3 minutes (!) 61/43  Pulse- Standing at 3 minutes 103  Mobility  Bed Position Chair  Activity Ambulated with assistance in hallway  Level of Assistance Contact guard assist, steadying assist  Assistive Device  (IV pole)  Distance Ambulated (ft) 450 ft  Activity Response Tolerated fair  $Mobility charge 1 Mobility   Pre-Mobility: 85 HR, 98% SpO2 Post-Mobility: 92 HR, 164/131 BP, 100% SpO2  Pt received in bed and agreeable. While standing for 3 minutes stated he sometimes cannot tolerate standing still for longer periods. Took x1 seated rest break prior to leaving d/t BP. No complaints during ambulation. Returned to chair with call bell in reach and RN notified of BP.   [place] Mobility Specialist  M.S. 2C and 6E: [phone] M.S. 4E: [phone]

## 2021-05-28 DIAGNOSIS — I6523 Occlusion and stenosis of bilateral carotid arteries: Secondary | ICD-10-CM | POA: Diagnosis not present

## 2021-05-28 DIAGNOSIS — R55 Syncope and collapse: Secondary | ICD-10-CM | POA: Diagnosis not present

## 2021-05-28 DIAGNOSIS — I951 Orthostatic hypotension: Secondary | ICD-10-CM | POA: Diagnosis not present

## 2021-05-28 LAB — GLUCOSE, CAPILLARY
Glucose-Capillary: 144 mg/dL — ABNORMAL HIGH (ref 70–99)
Glucose-Capillary: 148 mg/dL — ABNORMAL HIGH (ref 70–99)
Glucose-Capillary: 216 mg/dL — ABNORMAL HIGH (ref 70–99)
Glucose-Capillary: 182 mg/dL — ABNORMAL HIGH (ref 70–99)
Glucose-Capillary: 270 mg/dL — ABNORMAL HIGH (ref 70–99)

## 2021-05-28 LAB — BASIC METABOLIC PANEL
Anion gap: 5 (ref 5–15)
BUN: 27 mg/dL — ABNORMAL HIGH (ref 6–20)
CO2: 25 mmol/L (ref 22–32)
Calcium: 8.4 mg/dL — ABNORMAL LOW (ref 8.9–10.3)
Chloride: 108 mmol/L (ref 98–111)
Creatinine, Ser: 1.3 mg/dL — ABNORMAL HIGH (ref 0.61–1.24)
GFR, Estimated: >60 mL/min (ref >60)
Glucose, Bld: 147 mg/dL — ABNORMAL HIGH (ref 70–99)
Potassium: 4.2 mmol/L (ref 3.5–5.1)
Sodium: 138 mmol/L (ref 135–145)

## 2021-05-28 LAB — HEMOGLOBIN A1C
Hgb A1c MFr Bld: 10.5 % — ABNORMAL HIGH (ref 4.8–5.6)
Mean Plasma Glucose: 255 mg/dL

## 2021-05-28 MED ORDER — CARVEDILOL 3.125 MG PO TABS
3.1250 mg | ORAL_TABLET | Freq: Two times a day (BID) | ORAL | Status: DC
  Administered 2021-05-28 – 2021-05-29 (×3): 3.125 mg via ORAL
  Filled 2021-05-28 (×3): qty 1

## 2021-05-28 NOTE — Consult Note (Signed)
Hospital Consult    Reason for Consult: Left-sided internal carotid artery stenosis-asymptomatic Requesting Physician: Dr. Wynelle Cleveland MRN #:  [id number]  History of Present Illness: This is a 56 y.o. male who presented after syncopal episode at home.  Work-up demonstrated some orthostatic hypotension.  Vascular surgery was called after bilateral carotid ultrasound demonstrated known known right-sided occlusion, progression in left-sided stenosis, no vertebral artery visualized.  On exam, [facility] Carr was doing well.  History includes MI at the age of 65, with a multitude of other comorbidities.  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  Now, he is retired status post 5 vessel CABG in September 2021.  Good for denied new signs or symptoms of stroke, TIA, amaurosis.  He has had some orthostatic hypotension, most appreciated in the mornings when moving from supine to standing.  His wife is encouraged him to sit at the side of the bed for a minute or 2 prior to standing up.  This seems to help.  [facility] Carr believes his most recent episode was from standing too quickly.  Medications include aspirin, high intensity statin.  Past Medical History:  Diagnosis Date   Acute ST elevation myocardial infarction (STEMI) involving left circumflex coronary artery ([place]) 05/07/2016   PCI to Cx-OM   Anginal pain ([facility])    secondary to sm. 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 05/13/2016   Coronary artery disease involving native coronary artery of native heart with angina pectoris ([place]) 05/24/2016   Remote MI at 56 years of age, Last cath 2010-diffuse non-obstructive disease; last echo 06/16/08 -normal LV function, moderate concentric hypertrophy; nuc 08/2008 no ischemia;  medical  therapy; STEMI May 07 2016 - PCI to Cx-OM   Diabetes mellitus    ON ORAL MEDICATION AND INSULIN   Dilated cardiomyopathy ([place]) 05/24/2016   EF 325-30% by Echo post STEMI (previously 30-35%)    Hyperlipidemia    Hypertension    Myocardial infarction ([place]) 1996   Post MI   Peripheral vascular disease ([place])    HAS LEFT CAROTID ARTERY STENOSIS   AND IS S/P RIGHT CAROTID ENDARTERECTOMY 2010 Last carotid dopplers 01/08/2012 wth patent endarterectomy site   Status post coronary artery stent placement     Past Surgical History:  Procedure Laterality Date   APPENDECTOMY     CARDIAC CATHETERIZATION     FEB 2010, significant branch vessel disease wth diag, marginal, PDA & PLA, nml. LV function   CARDIAC CATHETERIZATION N/A 05/07/2016   Procedure: Left Heart Cath and Coronary Angiography;  Surgeon: Peter M Martinique, MD;  Location: [facility];  Service: Cardiovascular;  Laterality: N/A;   CARDIAC CATHETERIZATION N/A 05/07/2016   Procedure: Coronary Stent Intervention;  Surgeon: Peter M Martinique, MD;  Location: [facility];  Service: Cardiovascular;  Laterality: N/A;   CARDIAC CATHETERIZATION N/A 05/24/2016   Procedure: Left Heart Cath and Coronary Angiography;  Surgeon: [name], MD;  Location: [facility];  Service: Cardiovascular;  Laterality: N/A;   CARDIAC CATHETERIZATION N/A 05/24/2016   Procedure: Coronary Stent Intervention;  Surgeon: [name], MD;  Location: [facility];  Service: Cardiovascular;  Laterality: N/A;  2.5x20 Promus  to Ostial/proximal circumflex   CAROTID ENDARTERECTOMY  09/2008   Rt CEA   CLIPPING OF ATRIAL APPENDAGE Left 01/27/2020   Procedure: CLIPPING OF ATRIAL APPENDAGE USING ATRICURE 47 MM ATRICLIP;  Surgeon: Wonda Olds, MD;  Location: [facility];  Service: Open Heart Surgery;  Laterality: Left;   CORONARY ARTERY BYPASS GRAFT N/A 01/27/2020   Procedure: CORONARY ARTERY BYPASS GRAFTING (CABG), ON PUMP, TIMES FIVE, USING BILATERAL MAMMARY  ARTERIES, LEFT RADIAL ARTERY, AND ENDOSCOPICALLY HARVESTED RIGHT GREATER SAPHENOUS VEIN.;  Surgeon: Wonda Olds, MD;  Location: [facility];  Service: Open Heart Surgery;  Laterality: N/A;  LIMA->LAD RIMA->D1 (free graft) LRA-> OM SVG-> PDA->PL   ENDOVEIN HARVEST OF GREATER SAPHENOUS VEIN Right 01/27/2020   Procedure: ENDOVEIN HARVEST OF GREATER SAPHENOUS VEIN;  Surgeon: Wonda Olds, MD;  Location: [place];  Service: Open Heart Surgery;  Laterality: Right;   ICD IMPLANT N/A 06/30/2016   Procedure: ICD Implant;  Surgeon: Will Meredith Leeds, MD;  Location: [facility];  Service: Cardiovascular;  Laterality: N/A;   INTRAVASCULAR PRESSURE WIRE/FFR STUDY N/A 01/24/2020   Procedure: INTRAVASCULAR PRESSURE WIRE/FFR STUDY;  Surgeon: Belva Crome, MD;  Location: [facility];  Service: Cardiovascular;  Laterality: N/A;   LUNG BIOPSY  2013   Bx's suggest granulomatous dz.   RADIAL ARTERY HARVEST Left 01/27/2020   Procedure: RADIAL ARTERY HARVEST;  Surgeon: Wonda Olds, MD;  Location: [place];  Service: Open Heart Surgery;  Laterality: Left;   RIGHT/LEFT HEART CATH AND CORONARY ANGIOGRAPHY N/A 01/24/2020   Procedure: RIGHT/LEFT HEART CATH AND CORONARY ANGIOGRAPHY;  Surgeon: Belva Crome, MD;  Location: [facility];  Service: Cardiovascular;  Laterality: N/A;   RT ANKLE   2013   TEE WITHOUT CARDIOVERSION N/A 01/27/2020   Procedure: TRANSESOPHAGEAL ECHOCARDIOGRAM (TEE);  Surgeon: Wonda Olds, MD;  Location: [place];  Service: Open Heart Surgery;  Laterality: N/A;    No Known Allergies  Prior to Admission medications   Medication Sig Start Date End Date Taking? Authorizing Provider  albuterol (VENTOLIN HFA) 108 (90 Base) MCG/ACT inhaler Inhale 2 puffs into the lungs every 6 (six) hours as needed for wheezing or shortness of breath. 04/10/21  Yes Rakes, Connye Burkitt, FNP  ASPIRIN LOW DOSE 81 MG EC tablet TAKE ONE TABLET BY MOUTH DAILY Patient taking differently: Take 81 mg by  mouth daily. 05/17/21  Yes [name], Fransisca Kaufmann, MD  atorvastatin (LIPITOR) 80 MG tablet Take 1 tablet (80 mg total) by mouth every evening. 08/06/20  Yes [name], Fransisca Kaufmann, MD  carvedilol (COREG) 6.25 MG tablet TAKE ONE TABLET BY MOUTH TWICE DAILY WITH MEALS Patient taking differently: Take 6.25 mg by mouth 2 (two) times daily with a meal. 01/24/21  Yes Lorretta Harp, MD  clopidogrel (PLAVIX) 75 MG tablet TAKE ONE TABLET BY MOUTH IN THE EVENING Patient taking differently: Take 75 mg by mouth daily after supper. 08/06/20  Yes [name], Fransisca Kaufmann, MD  DULoxetine (CYMBALTA) 60 MG capsule TAKE TWO CAPSULES BY MOUTH DAILY Patient taking differently: Take 120 mg by mouth daily. 05/17/21  Yes [name], Fransisca Kaufmann, MD  ENTRESTO 24-26 MG TAKE ONE TABLET BY MOUTH TWICE DAILY Patient taking differently: Take 1 tablet by mouth 2 (two) times daily. 09/27/20  Yes Lorretta Harp, MD  furosemide (LASIX) 40 MG tablet One tablet daily as needed for weight gain 3 lb in one day or 5 lb in 1 week or for swelling Patient taking differently: Take 40  mg by mouth See admin instructions. One tablet daily as needed for weight gain 3 lb in one day or 5 lb in 1 week or for swelling 01/24/21  Yes Lorretta Harp, MD  HYDROcodone bit-homatropine (HYCODAN) 5-1.5 MG/5ML syrup Take 5 mLs by mouth every 6 (six) hours as needed for cough. 05/24/21  Yes Martin, Mary-Margaret, FNP  hydrOXYzine (ATARAX) 25 MG tablet TAKE ONE TABLET BY MOUTH THREE TIMES DAILY AS NEEDED Patient taking differently: Take 25 mg by mouth daily. 05/22/21  Yes [name], Fransisca Kaufmann, MD  ibuprofen (ADVIL) 200 MG tablet Take 400 mg by mouth every 6 (six) hours as needed for mild pain, moderate pain or headache.   Yes [provider]  isosorbide dinitrate (ISORDIL) 10 MG tablet TAKE ONE TABLET BY MOUTH TWICE DAILY Patient taking differently: Take 10 mg by mouth 2 (two) times daily. 03/11/21  Yes Lorretta Harp, MD  levofloxacin (LEVAQUIN) 500 MG tablet  Take 1 tablet (500 mg total) by mouth daily. 05/17/21  Yes Martin, Mary-Margaret, FNP  mirtazapine (REMERON) 7.5 MG tablet Take 1 tablet (7.5 mg total) by mouth at bedtime. TAKE ONE TABLET BY MOUTH AT BEDTIME FOR DEPRESSION Patient taking differently: Take 7.5 mg by mouth at bedtime. 08/06/20  Yes [name], Fransisca Kaufmann, MD  primidone (MYSOLINE) 50 MG tablet TAKE ONE TABLET BY MOUTH AT BEDTIME Patient taking differently: Take 50 mg by mouth at bedtime. 05/17/21  Yes [name], Fransisca Kaufmann, MD  Semaglutide, 2 MG/DOSE, 8 MG/3ML SOPN Inject 2 mg as directed once a week. Patient taking differently: Inject 2 mg as directed every Saturday. 11/08/20  Yes [name], Fransisca Kaufmann, MD  tamsulosin (FLOMAX) 0.4 MG CAPS capsule TAKE 1 CAPSULE DAILY IN THE EVENING Patient taking differently: Take 0.4 mg by mouth daily after supper. 08/06/20  Yes [name], Fransisca Kaufmann, MD  topiramate (TOPAMAX) 50 MG tablet TAKE ONE TABLET BY MOUTH AT BEDTIME AS NEEDED Patient taking differently: Take 50 mg by mouth as needed (tremors). 05/17/21  Yes [name], Fransisca Kaufmann, MD  blood glucose meter kit and supplies KIT Inject 1 each into the skin 4 (four) times daily as needed. Dispense based on patient and insurance preference. Use up to four times daily as directed. (FOR ICD-9 250.00, 250.01). 06/30/19   [name], Fransisca Kaufmann, MD  glucose blood (ONETOUCH VERIO) test strip 1 each by Other route in the morning, at noon, in the evening, and at bedtime. use for testing 06/30/19   [name], Fransisca Kaufmann, MD  Insulin Pen Needle (B-D ULTRAFINE III SHORT PEN) 31G X 8 MM MISC USE TO INJECT INSULIN DAILY DX E11.9 02/21/20   [name], Fransisca Kaufmann, MD  OneTouch Delica Lancets 60V MISC Test blood sugars three times daily 09/30/19   [name], Fransisca Kaufmann, MD    Social History   Socioeconomic History   Marital status: Married    Spouse name: [name]   Number of children: 3   Years of education: Not on file   Highest education level: Not on file  Occupational History    Occupation: Agricultural consultant for [employer]    Employer: [employer]   Tobacco Use   Smoking status: Former    Packs/day: 2.00    Years: 22.00    Pack years: 44.00    Types: Cigarettes    Quit date: 10/23/2011    Years since quitting: 9.6   Smokeless tobacco: Current    Types: Snuff  Vaping Use   Vaping Use: Never used  Substance and Sexual Activity  Alcohol use: No   Drug use: No   Sexual activity: Not on file  Other Topics Concern   Not on file  Social History Narrative   Pt lives with family in [place], [place].   Social Determinants of Health   Financial Resource Strain: Not on file  Food Insecurity: Not on file  Transportation Needs: Not on file  Physical Activity: Not on file  Stress: Not on file  Social Connections: Not on file  Intimate Partner Violence: Not on file     Family History  Problem Relation Age of Onset   Hypertension Mother    Diabetes Mother    Hypertension Maternal Grandfather    Heart attack Paternal Grandfather    Heart attack Father 63    ROS: Otherwise negative unless mentioned in HPI  Physical Examination  Vitals:   05/27/21 2100 05/28/21 0500  BP: 127/73 118/70  Pulse: 93 68  Resp: 20 18  Temp: 98.6 F (37 C) 97.7 F (36.5 C)  SpO2:     Body mass index is 27.74 kg/m.  General:  WDWN in NAD Gait: Not observed HENT: WNL, normocephalic Pulmonary: normal non-labored breathing, without Rales, rhonchi,  wheezing Cardiac: regular Abdomen: soft, NT/ND, no masses Skin: without rashes Vascular Exam/Pulses: Nonpalpable pulses in bilateral upper extremities, palpable pulses in the feet Extremities: without ischemic changes, without Gangrene , without cellulitis; without open wounds;  Musculoskeletal: no muscle wasting or atrophy  Neurologic: A&O X 3;  No focal weakness or paresthesias are detected; speech is fluent/normal Psychiatric:  The pt has Normal affect. Lymph:  Unremarkable  CBC    Component Value Date/Time   WBC 16.8 (H) 05/27/2021 0303    RBC 4.83 05/27/2021 0303   HGB 13.5 05/27/2021 0303   HGB 13.5 08/06/2020 1558   HCT 40.7 05/27/2021 0303   HCT 41.6 08/06/2020 1558   PLT 190 05/27/2021 0303   PLT 195 08/06/2020 1558   MCV 84.3 05/27/2021 0303   MCV 85 08/06/2020 1558   MCH 28.0 05/27/2021 0303   MCHC 33.2 05/27/2021 0303   RDW 12.7 05/27/2021 0303   RDW 13.9 08/06/2020 1558   LYMPHSABS 2.7 08/06/2020 1558   MONOABS 1.0 01/22/2020 1614   EOSABS 0.2 08/06/2020 1558   BASOSABS 0.1 08/06/2020 1558    BMET    Component Value Date/Time   NA 138 05/28/2021 0323   NA 137 11/08/2020 1637   K 4.2 05/28/2021 0323   CL 108 05/28/2021 0323   CO2 25 05/28/2021 0323   GLUCOSE 147 (H) 05/28/2021 0323   BUN 27 (H) 05/28/2021 0323   BUN 39 (H) 11/08/2020 1637   CREATININE 1.30 (H) 05/28/2021 0323   CALCIUM 8.4 (L) 05/28/2021 0323   GFRNONAA >60 05/28/2021 0323   GFRAA 66 03/20/2020 1235    COAGS: Lab Results  Component Value Date   INR 1.5 (H) 01/27/2020   INR 1.1 01/26/2020   INR 1.01 05/20/2017     Non-Invasive Vascular Imaging:   Patient with bilateral carotid ultrasound demonstrating chronic occlusion of the right internal carotid artery, less than 80% narrowing of the left internal carotid artery.  Vertebral arteries not visualized  Follow-up CT angio head and neck independently reviewed demonstrating known right-sided internal carotid artery occlusion, roughly 70% stenosis of the left-sided internal carotid artery, bilateral vertebral arteries fill through collaterals in the the V2-V3 segment.   ASSESSMENT/PLAN: This is a 56 y.o. male with single-vessel inline flow to the head. The right internal carotid artery is occluded,  bilateral vertebral arteries fill through collaterals.  The left internal carotid artery demonstrates stenosis, but provides inline flow to the brain. Patient denies symptoms of TIA, stroke, amaurosis.  Being that the patient is asymptomatic, I do not believe urgent revascularization  would be in his best benefit.  Being that he has single-vessel inline flow to to his head, cerebral revascularization would have a high risk of stroke.  I will see him in the outpatient setting in 3 months with new duplex ultrasound of his carotids.  Should this increase to greater than 80% stenosis we would discuss high risk intervention.  Patient would benefit from high intensity statin, dual antiplatelet therapy.  We discussed the signs and symptoms of stroke and asked him to seek immediate medical attention should these present.    Cassandria Santee MD MS [facility] [phone] 05/28/2021  11:17 AM

## 2021-05-28 NOTE — Consult Note (Addendum)
The patient has been seen in conjunction with Rosaria Ferries, PAC. All aspects of care have been considered and discussed. The patient has been personally interviewed, examined, and all clinical data has been reviewed.  Nice gentleman with complicated CV history related to CAD and ischemic CM with chronic systolic HF, diffuse cerebrovascular disease, long standing DM II with neuropathy, prior VF arrest, AICD, and recent fainting with standing. Orthostasis documented today with SBP 60 mmHg.  Interrogation of device does not reveal arrhythmia as etiology of syncope. 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 RECOMMEND: Stop tamsulosin, isosorbide, and prn lasix. Moderate tension knee high support stocking. Consider pharmacologic therapy as a last option (Florinef; Midodrine; others). Hydration and liberalize salt in diet.      Cardiology Consultation:   Patient ID: Douglas Carr MRN: [id number]; DOB: Apr 04, 1966  Admit date: 05/26/2021 Date of Consult: 05/28/2021  PCP:  [name], Fransisca Kaufmann, MD   [facility] Providers Cardiologist:  Quay Burow, MD  Electrophysiologist:  Constance Haw, MD       Patient Profile:   Douglas Carr is a 56 y.o. male with a hx of CAD s/p stents 2018 and CABG 0929, combined systolic and diastolic CHF with EF 57% s/p MDT [facility] MRI AICD, T2DM, PVD s/p R CEA, HTN, HLD, CKD, VF arrest with anoxic brain injury 2018, who is being seen 05/28/2021 for the evaluation of Syncope, ?2nd meds, at the request of Dr Wynelle Cleveland.  History of Present Illness:   Douglas Carr was admitted 01/22 with syncope that happened after he sat up on the side of the bed for a minute and then stood up and started to walk, and fell. Possible brief LOC. Some shaking at first, no incontinence or clear seizure  activity. Never lost resp/pulse.   He reports a long hx of orthostatic hypotension by symptoms, not documented. However, he feels the sx consistently. He has fallen multiple times, mostly during the night when getting up to the BR. However, he started sitting on the side of the bed before he gets up, that has helped. He will still get a little light-headed, but not bad enough to cause a fall. Hehas gone to his knees to keep from passing out, but not all the time.  On the evening of admission, he did not pause on the side of the bed when he sat up. Then he got  up to walk and did not stop, kept walking when he got dizzy. He woke up almost as soon as he fell.   His device was interrogated and showed no vent rates > 200, no VF. VT rx is off. No Afib, V sensing > 99% of the time, lower HR 40 bpm. He has not had any NSVT since 12/2020, no recent need to pace.   Douglas Carr has not had chest pain since the CABG. He is able to walk as much as 300 yds. Did ok on a family trip to [facility], as long as he walked slow.   Never has palpitations, no resting presyncope.   Dr Wynelle Cleveland told him he could go home if Cards cleared him.   However, orthostatics have been measured and SBP has dropped at lease 40 points going from sitting to standing.   He was out of his meds for a few days, restarted them before he passed out.  He says he is getting his meds  by packets, so will be compliant.    Past Medical History:  Diagnosis Date   Acute ST elevation myocardial infarction (STEMI) involving left circumflex coronary artery ([place]) 05/07/2016   PCI to Cx-OM   Anginal pain (HCC)    secondary to sm. vessel disease   Anxiety    Arthritis    BIL KNEE PAIN AND BIL ANKLE PAIN   Bone spur of ankle    Cardiac arrest ([place]) 05/24/2016   with v fib   Chronic combined systolic and diastolic CHF, NYHA class 2 and ACA/AHA stage C 05/13/2016   Coronary artery disease involving native coronary artery of native heart with  angina pectoris ([place]) 05/24/2016   Remote MI at 56 years of age, Last cath 2010-diffuse non-obstructive disease; last echo 06/16/08 -normal LV function, moderate concentric hypertrophy; nuc 08/2008 no ischemia;  medical therapy; STEMI May 07 2016 - PCI to Cx-OM   Diabetes mellitus    ON ORAL MEDICATION AND INSULIN   Dilated cardiomyopathy ([place]) 05/24/2016   EF 325-30% by Echo post STEMI (previously 30-35%)    Hyperlipidemia    Hypertension    Myocardial infarction ([place]) 1996   Post MI   Peripheral vascular disease ([place])    HAS LEFT CAROTID ARTERY STENOSIS   AND IS S/P RIGHT CAROTID ENDARTERECTOMY 2010 Last carotid dopplers 01/08/2012 wth patent endarterectomy site   Status post coronary artery stent placement     Past Surgical History:  Procedure Laterality Date   APPENDECTOMY     CARDIAC CATHETERIZATION     FEB 2010, significant branch vessel disease wth diag, marginal, PDA & PLA, nml. LV function   CARDIAC CATHETERIZATION N/A 05/07/2016   Procedure: Left Heart Cath and Coronary Angiography;  Surgeon: Peter M Martinique, MD;  Location: [place] [facility];  Service: Cardiovascular;  Laterality: N/A;   CARDIAC CATHETERIZATION N/A 05/07/2016   Procedure: Coronary Stent Intervention;  Surgeon: Peter M Martinique, MD;  Location: [place] [facility];  Service: Cardiovascular;  Laterality: N/A;   CARDIAC CATHETERIZATION N/A 05/24/2016   Procedure: Left Heart Cath and Coronary Angiography;  Surgeon: [name], MD;  Location:  [facility];  Service: Cardiovascular;  Laterality: N/A;   CARDIAC CATHETERIZATION N/A 05/24/2016   Procedure: Coronary Stent Intervention;  Surgeon: [name], MD;  Location: [place] [facility];  Service: Cardiovascular;  Laterality: N/A;  2.5x20 Promus to Ostial/proximal circumflex   CAROTID ENDARTERECTOMY  09/2008   Rt CEA   CLIPPING OF ATRIAL APPENDAGE Left 01/27/2020   Procedure: CLIPPING OF ATRIAL APPENDAGE USING ATRICURE 33 MM ATRICLIP;  Surgeon: Wonda Olds,  MD;  Location: [place];  Service: Open Heart Surgery;  Laterality: Left;   CORONARY ARTERY BYPASS GRAFT N/A 01/27/2020   Procedure: CORONARY ARTERY BYPASS GRAFTING (CABG), ON PUMP, TIMES FIVE, USING BILATERAL MAMMARY ARTERIES, LEFT RADIAL ARTERY, AND ENDOSCOPICALLY HARVESTED RIGHT GREATER SAPHENOUS VEIN.;  Surgeon: Wonda Olds, MD;  Location: [place];  Service: Open Heart Surgery;  Laterality: N/A;  LIMA->LAD RIMA->D1 (free graft) LRA-> OM SVG-> PDA->PL   ENDOVEIN HARVEST OF GREATER SAPHENOUS VEIN Right 01/27/2020   Procedure: ENDOVEIN HARVEST OF GREATER SAPHENOUS VEIN;  Surgeon: Wonda Olds, MD;  Location: [place];  Service: Open Heart Surgery;  Laterality: Right;   ICD IMPLANT N/A 06/30/2016   Procedure: ICD Implant;  Surgeon: Will Meredith Leeds, MD;  Location: [place] [facility];  Service: Cardiovascular;  Laterality: N/A;   INTRAVASCULAR PRESSURE WIRE/FFR STUDY N/A 01/24/2020   Procedure: INTRAVASCULAR  PRESSURE WIRE/FFR STUDY;  Surgeon: Belva Crome, MD;  Location: [place] [facility];  Service: Cardiovascular;  Laterality: N/A;   LUNG BIOPSY  2013   Bx's suggest granulomatous dz.   RADIAL ARTERY HARVEST Left 01/27/2020   Procedure: RADIAL ARTERY HARVEST;  Surgeon: Wonda Olds, MD;  Location: [place];  Service: Open Heart Surgery;  Laterality: Left;   RIGHT/LEFT HEART CATH AND CORONARY ANGIOGRAPHY N/A 01/24/2020   Procedure: RIGHT/LEFT HEART CATH AND CORONARY ANGIOGRAPHY;  Surgeon: Belva Crome, MD;  Location: [place] [facility];  Service: Cardiovascular;  Laterality: N/A;   RT ANKLE   2013   TEE WITHOUT CARDIOVERSION N/A 01/27/2020   Procedure: TRANSESOPHAGEAL ECHOCARDIOGRAM (TEE);  Surgeon: Wonda Olds, MD;  Location: [place];  Service: Open Heart Surgery;  Laterality: N/A;     Home Medications:  Prior to Admission medications   Medication Sig Start Date End Date Taking? Authorizing Provider  albuterol (VENTOLIN HFA) 108 (90 Base) MCG/ACT inhaler Inhale 2 puffs into the  lungs every 6 (six) hours as needed for wheezing or shortness of breath. 04/10/21  Yes Rakes, Connye Burkitt, FNP  ASPIRIN LOW DOSE 81 MG EC tablet TAKE ONE TABLET BY MOUTH DAILY Patient taking differently: Take 81 mg by mouth daily. 05/17/21  Yes [name], Fransisca Kaufmann, MD  atorvastatin (LIPITOR) 80 MG tablet Take 1 tablet (80 mg total) by mouth every evening. 08/06/20  Yes [name], Fransisca Kaufmann, MD  carvedilol (COREG) 6.25 MG tablet TAKE ONE TABLET BY MOUTH TWICE DAILY WITH MEALS Patient taking differently: Take 6.25 mg by mouth 2 (two) times daily with a meal. 01/24/21  Yes Lorretta Harp, MD  clopidogrel (PLAVIX) 75 MG tablet TAKE ONE TABLET BY MOUTH IN THE EVENING Patient taking differently: Take 75 mg by mouth daily after supper. 08/06/20  Yes [name], Fransisca Kaufmann, MD  DULoxetine (CYMBALTA) 60 MG capsule TAKE TWO CAPSULES BY MOUTH DAILY Patient taking differently: Take 120 mg by mouth daily. 05/17/21  Yes [name], Fransisca Kaufmann, MD  ENTRESTO 24-26 MG TAKE ONE TABLET BY MOUTH TWICE DAILY Patient taking differently: Take 1 tablet by mouth 2 (two) times daily. 09/27/20  Yes Lorretta Harp, MD  furosemide (LASIX) 40 MG tablet One tablet daily as needed for weight gain 3 lb in one day or 5 lb in 1 week or for swelling Patient taking differently: Take 40 mg by mouth See admin instructions. One tablet daily as needed for weight gain 3 lb in one day or 5 lb in 1 week or for swelling 01/24/21  Yes Lorretta Harp, MD  HYDROcodone bit-homatropine (HYCODAN) 5-1.5 MG/5ML syrup Take 5 mLs by mouth every 6 (six) hours as needed for cough. 05/24/21  Yes Martin, Mary-Margaret, FNP  hydrOXYzine (ATARAX) 25 MG tablet TAKE ONE TABLET BY MOUTH THREE TIMES DAILY AS NEEDED Patient taking differently: Take 25 mg by mouth daily. 05/22/21  Yes [name], Fransisca Kaufmann, MD  ibuprofen (ADVIL) 200 MG tablet Take 400 mg by mouth every 6 (six) hours as needed for mild pain, moderate pain or headache.   Yes [provider]  isosorbide  dinitrate (ISORDIL) 10 MG tablet TAKE ONE TABLET BY MOUTH TWICE DAILY Patient taking differently: Take 10 mg by mouth 2 (two) times daily. 03/11/21  Yes Lorretta Harp, MD  levofloxacin (LEVAQUIN) 500 MG tablet Take 1 tablet (500 mg total) by mouth daily. 05/17/21  Yes Martin, Mary-Margaret, FNP  mirtazapine (REMERON) 7.5 MG tablet Take 1 tablet (7.5 mg total) by  mouth at bedtime. TAKE ONE TABLET BY MOUTH AT BEDTIME FOR DEPRESSION Patient taking differently: Take 7.5 mg by mouth at bedtime. 08/06/20  Yes [name], Fransisca Kaufmann, MD  primidone (MYSOLINE) 50 MG tablet TAKE ONE TABLET BY MOUTH AT BEDTIME Patient taking differently: Take 50 mg by mouth at bedtime. 05/17/21  Yes [name], Fransisca Kaufmann, MD  Semaglutide, 2 MG/DOSE, 8 MG/3ML SOPN Inject 2 mg as directed once a week. Patient taking differently: Inject 2 mg as directed every Saturday. 11/08/20  Yes [name], Fransisca Kaufmann, MD  tamsulosin (FLOMAX) 0.4 MG CAPS capsule TAKE 1 CAPSULE DAILY IN THE EVENING Patient taking differently: Take 0.4 mg by mouth daily after supper. 08/06/20  Yes [name], Fransisca Kaufmann, MD  topiramate (TOPAMAX) 50 MG tablet TAKE ONE TABLET BY MOUTH AT BEDTIME AS NEEDED Patient taking differently: Take 50 mg by mouth as needed (tremors). 05/17/21  Yes [name], Fransisca Kaufmann, MD  blood glucose meter kit and supplies KIT Inject 1 each into the skin 4 (four) times daily as needed. Dispense based on patient and insurance preference. Use up to four times daily as directed. (FOR ICD-9 250.00, 250.01). 06/30/19   [name], Fransisca Kaufmann, MD  glucose blood (ONETOUCH VERIO) test strip 1 each by Other route in the morning, at noon, in the evening, and at bedtime. use for testing 06/30/19   [name], Fransisca Kaufmann, MD  Insulin Pen Needle (B-D ULTRAFINE III SHORT PEN) 31G X 8 MM MISC USE TO INJECT INSULIN DAILY DX E11.9 02/21/20   [name], Fransisca Kaufmann, MD  OneTouch Delica Lancets 73U MISC Test blood sugars three times daily 09/30/19   [name], Fransisca Kaufmann, MD     Inpatient Medications: Scheduled Meds:  aspirin EC  81 mg Oral Daily   atorvastatin  80 mg Oral QPM   carvedilol  3.125 mg Oral Q12H   clopidogrel  75 mg Oral QPM   diclofenac Sodium  2 g Topical QID   DULoxetine  120 mg Oral Daily   enoxaparin (LOVENOX) injection  40 mg Subcutaneous Q24H   insulin aspart  0-9 Units Subcutaneous TID WC   insulin glargine-yfgn  8 Units Subcutaneous QHS   mirtazapine  7.5 mg Oral QHS   primidone  50 mg Oral QHS   sodium chloride flush  3 mL Intravenous Q12H   sodium chloride flush  3 mL Intravenous Q12H   tamsulosin  0.4 mg Oral QPC supper   Continuous Infusions:  sodium chloride     PRN Meds: sodium chloride, acetaminophen **OR** acetaminophen, hydrOXYzine, oxyCODONE, sodium chloride flush, topiramate  Allergies:   No Known Allergies  Social History:   Social History   Socioeconomic History   Marital status: Married    Spouse name: [name]   Number of children: 3   Years of education: Not on file   Highest education level: Not on file  Occupational History   Occupation: Agricultural consultant for [employer]    Employer: [employer]   Tobacco Use   Smoking status: Former    Packs/day: 2.00    Years: 22.00    Pack years: 44.00    Types: Cigarettes    Quit date: 10/23/2011    Years since quitting: 9.6   Smokeless tobacco: Current    Types: Snuff  Vaping Use   Vaping Use: Never used  Substance and Sexual Activity   Alcohol use: No   Drug use: No   Sexual activity: Not on file  Other Topics Concern   Not on file  Social History Narrative  Pt lives with family in [place], [place].   Social Determinants of Health   Financial Resource Strain: Not on file  Food Insecurity: Not on file  Transportation Needs: Not on file  Physical Activity: Not on file  Stress: Not on file  Social Connections: Not on file  Intimate Partner Violence: Not on file    Family History:   Family History  Problem Relation Age of Onset   Hypertension Mother    Diabetes Mother     Hypertension Maternal Grandfather    Heart attack Paternal Grandfather    Heart attack Father 15     ROS:  Please see the history of present illness.  All other ROS reviewed and negative.     Physical Exam/Data:   Vitals:   05/27/21 0429 05/27/21 2100 05/28/21 0500 05/28/21 1154  BP:  127/73 118/70 (!) 161/83  Pulse: 74 93 68 91  Resp: _0 Temp:  98.6 F (37 C) 97.7 F (36.5 C) 97.6 F (36.4 C)  TempSrc:  Oral Oral Oral  SpO2: 97%     Weight: 87.7 kg     Height:       No intake or output data in the 24 hours ending 05/28/21 1615 Last 3 Weights 05/27/2021 05/26/2021 05/26/2021  Weight (lbs) 193 lb 5.5 oz 188 lb 0.8 oz 191 lb 1.6 oz  Weight (kg) 87.7 kg 85.3 kg 86.682 kg     Body mass index is 27.74 kg/m.  General:  Well nourished, well developed, in no acute distress HEENT: normal Neck: no JVD Vascular: No carotid bruits; Distal pulses 2+ bilaterally Cardiac:  normal S1, S2; RRR; no murmur  Lungs:  clear to auscultation bilaterally, no wheezing, rhonchi or rales  Abd: soft, nontender, no hepatomegaly  Ext: no edema Musculoskeletal:  No deformities, BUE and BLE strength normal for age and equal Skin: warm and dry  Neuro:  CNs 2-12 intact, no focal abnormalities noted Psych:  Normal affect   EKG:  The EKG was personally reviewed and demonstrates:  SR, HR 80, diffuse ST changes, mild, similar to prev ECG Telemetry:  Telemetry was personally reviewed and demonstrates:  SR, PVCs and pairs.  Relevant CV Studies:  CAROTID DOPPLERS:05/27/2021 Summary: Right Carotid: Evidence consistent with a total occlusion of the right ICA. Left Carotid: Velocities in the left ICA are consistent with a 60-79% stenosis. Vertebrals: Bilateral vertebral arteries were not visualized.  ECHO: 05/27/2021  1. Global hypokinesis worse in the inferior base . Left ventricular  ejection fraction, by estimation, is 30 to 35%. The left ventricle has  moderately decreased function. The  left ventricle has no regional wall motion abnormalities. The left ventricular internal cavity size was moderately dilated. Left ventricular diastolic parameters were normal.   2. Right ventricular systolic function is normal. The right ventricular  size is normal.   3. Left atrial size was mildly dilated.   4. The mitral valve is abnormal. No evidence of mitral valve  regurgitation. No evidence of mitral stenosis.   5. The aortic valve is tricuspid. There is mild calcification of the  aortic valve. Aortic valve regurgitation is not visualized. Aortic valve  sclerosis is present, with no evidence of aortic valve stenosis.   6. The inferior vena cava is normal in size with greater than 50%  respiratory variability, suggesting right atrial pressure of 3 mmHg.   Laboratory Data:  High Sensitivity Troponin:   Recent Labs  Lab 05/26/21 1106 05/26/21 1305 05/27/21 0011  TROPONINIHS  25* 24* 30*     Chemistry Recent Labs  Lab 05/26/21 1957 05/27/21 0303 05/28/21 0323  NA 141 135 138  K 6.3* 4.6 4.2  CL 108 109 108  CO2 _0 GLUCOSE 225* 224* 147*  BUN 41* 39* 27*  CREATININE 1.54* 1.36* 1.30*  CALCIUM 9.1 8.4* 8.4*  GFRNONAA 53* >60 >60  ANIONGAP 9 4* 5    No results for input(s): PROT, ALBUMIN, AST, ALT, ALKPHOS, BILITOT in the last 168 hours. Lipids No results for input(s): CHOL, TRIG, HDL, LABVLDL, LDLCALC, CHOLHDL in the last 168 hours.  Hematology Recent Labs  Lab 05/26/21 1106 05/27/21 0303  WBC 12.5* 16.8*  RBC 5.15 4.83  HGB 14.1 13.5  HCT 43.4 40.7  MCV 84.3 84.3  MCH 27.4 28.0  MCHC 32.5 33.2  RDW 13.1 12.7  PLT 214 190   Thyroid No results for input(s): TSH, FREET4 in the last 168 hours.  BNPNo results for input(s): BNP, PROBNP in the last 168 hours.  DDimer No results for input(s): DDIMER in the last 168 hours.   Radiology/Studies:  CT ANGIO HEAD NECK W WO CM  Result Date: 05/27/2021 CLINICAL DATA:  Evaluate vertebral arteries EXAM: CT  ANGIOGRAPHY HEAD AND NECK TECHNIQUE: Multidetector CT imaging of the head and neck was performed using the standard protocol during bolus administration of intravenous contrast. Multiplanar CT image reconstructions and MIPs were obtained to evaluate the vascular anatomy. Carotid stenosis measurements (when applicable) are obtained utilizing NASCET criteria, using the distal internal carotid diameter as the denominator. RADIATION DOSE REDUCTION: This exam was performed according to the departmental dose-optimization program which includes automated exposure control, adjustment of the mA and/or kV according to patient size and/or use of iterative reconstruction technique. CONTRAST:  44m OMNIPAQUE IOHEXOL 350 MG/ML SOLN COMPARISON:  06/09/2016 CTA head, correlation is also made with CT head 05/26/2021 FINDINGS: CT HEAD FINDINGS Brain: No evidence of acute infarction, hemorrhage, cerebral edema, mass, mass effect, or midline shift. No hydrocephalus or extra-axial fluid collection. Unchanged size and configuration of the ventricles. Redemonstrated hypodensity in the left frontal and parietal lobe, unchanged. Vascular: No hyperdense vessel. Skull: Normal. Negative for fracture or focal lesion. Sinuses/Orbits: No acute finding. Status post bilateral lens replacements. Other: The mastoid air cells are well aerated. CTA NECK FINDINGS Aortic arch: Two-vessel arch with a common origin of the brachiocephalic and left common carotid arteries. Imaged portion shows no evidence of aneurysm or dissection. Aortic atherosclerosis, which extends into the origins of the branch vessels, without hemodynamically significant stenosis. 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 The distal right ICA was noted to be occluded on the 2018 CTA, but no prior imaging of the neck is available. Left carotid system: Mild narrowing of the distal left  CCA, secondary to calcified and noncalcified plaque. Calcified and noncalcified plaque at the bifurcation and in the proximal left ICA which causes up to 70% stenosis (series 9, images 197-225). Vertebral arteries: The left vertebral artery is not visualized in the V1 and V2 segments, with distal reconstitution at the level of C3 (series 9, image 214), secondary to collaterals (series 9, image 206). The remainder of the V2 and V3 segments demonstrate mild calcification and multifocal mild narrowing. The right vertebral artery is not visualized in the V1 and majority of the V2 segment, with faint opacification in the distal V2, with reconstitution distally in the mid V3 segment (series 9,  image 168) via a collateral vessel (series 6, image 167). Skeleton: Prior median sternotomy. Mild degenerative changes in the cervical spine. No acute osseous abnormality. Other neck: No acute finding. Upper chest: No focal parenchymal opacity. Mild peribronchial thickening. No pleural effusion. Review of the MIP images confirms the above findings CTA HEAD FINDINGS Anterior circulation: Non opacification of the intracranial right ICA in the petrous portion, with minimal opacification in the cavernous and supraclinoid segment, although the vessel remains diminutive, likely secondary to atherosclerotic disease. The intracranial left ICA is patent, with mild narrowing in the distal petrous portion (series 9, image 138 and 137). A1 segments patent. Normal anterior communicating artery. Anterior cerebral arteries are patent to their distal aspects. No M1 stenosis or occlusion. Normal MCA bifurcations. Distal MCA branches are perfused without focal stenosis. Posterior circulation: Vertebral arteries patent to the vertebrobasilar junction without stenosis. Posterior inferior cerebral arteries patent proximally, however the right PICA demonstrates focal non opacification just distal to its takeoff (series 9, image 146), with distal  reconstitution. Basilar patent to its distal aspect. Superior cerebellar arteries patent proximally. Bilateral P1 segments originate from the basilar artery. PCAs perfused but irregular. Diminutive bilateral posterior communicating arteries are visualized. Venous sinuses: As permitted by contrast timing, patent. Anatomic variants: None significant Review of the MIP images confirms the above findings IMPRESSION: 1. 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 The intracranial portion was noted to be occluded on 06/09/2016; although there is no prior imaging of the cervical ICA or CCA, this is likely chronic. 2. Approximately 70% stenosis in the proximal left ICA. 3. 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 4. Occlusion of the right vertebral artery from its origin to the mid V3 segment, where it is reconstituted via collateral vessel, with minimal faint opacification in the distal V2 and proximal V3, likely retrograde. 5. Focal stenosis in the right PICA, just distal to its takeoff. 6. Mild narrowing in the distal petrous left ICA. These results were called by telephone at the time of interpretation on 05/27/2021 at 10:36 pm to provider Nurse Hulan Fess, for Dr. Orlie Pollen , who verbally acknowledged these results. Electronically Signed   By: Merilyn Baba M.D.   On: 05/27/2021 22:36   DG Ribs Unilateral W/Chest Right  Result Date: 05/26/2021 CLINICAL DATA:  Syncope.  Fall.  Right rib pain. EXAM: RIGHT RIBS AND CHEST - 3+ VIEW COMPARISON:  None. FINDINGS: The lungs are clear without focal pneumonia, edema, pneumothorax or pleural effusion. Calcified granulomata again noted right middle lobe as seen on CT chest 04/12/2012. Sclerotic lesion anterior right third  rib also stable since prior CT. No evidence for pneumothorax or pleural effusion. Pacer/AICD noted. The cardiopericardial silhouette is within normal limits for size. Oblique views of the right ribs show no evidence for displaced acute right-sided rib fracture. IMPRESSION: 1. No acute cardiopulmonary findings. 2. No displaced acute right-sided rib fracture. 3. Stable calcified granulomata right middle lobe and sclerotic lesion anterior right third rib. Electronically Signed   By: Misty Stanley M.D.   On: 05/26/2021 12:08   CT Head Wo Contrast  Result Date: 05/26/2021 CLINICAL DATA:  Patient with syncope.  Fall. EXAM: CT HEAD WITHOUT CONTRAST TECHNIQUE: Contiguous axial images were obtained from the base of the  skull through the vertex without intravenous contrast. RADIATION DOSE REDUCTION: This exam was performed according to the departmental dose-optimization program which includes automated exposure control, adjustment of the mA and/or kV according to patient size and/or use of iterative reconstruction technique. COMPARISON:  Brain CT 05/20/2017. FINDINGS: Brain: Ventricles and sulci are prominent compatible with atrophy. Periventricular and subcortical white matter hypodensities compatible with chronic microvascular ischemic changes. No evidence for acute cortically based infarct, intracranial hemorrhage, mass lesion or mass-effect. Vascular: Unremarkable Skull: Intact Sinuses/Orbits: Paranasal sinuses are well aerated. Mastoid air cells are unremarkable. Other: None. IMPRESSION: No acute intracranial process. Atrophy and chronic microvascular ischemic changes. Electronically Signed   By: Lovey Newcomer M.D.   On: 05/26/2021 11:55   ECHOCARDIOGRAM COMPLETE  Result Date: 05/27/2021    ECHOCARDIOGRAM REPORT   Patient Name:   Douglas Carr [facility] Date of Exam: 05/27/2021 Medical Rec #:  [id number]           Height:       70.0 in Accession #:    [id number]          Weight:       193.3 lb Date of Birth:  02/16/1966            BSA:          2.058 m Patient Age:    17 years            BP:           158/82 mmHg Patient Gender: M                   HR:           66 bpm. Exam Location:  Inpatient Procedure: 2D Echo, Cardiac Doppler and Color Doppler Indications:    R55 Syncope  History:        Patient has prior history of Echocardiogram examinations, most                 recent 01/23/2020. Cardiomyopathy, CAD and Previous Myocardial                 Infarction, Defibrillator, Signs/Symptoms:Syncope; Risk                 Factors:Hypertension and Dyslipidemia.  Sonographer:    Glo Herring Referring Phys: [id number] [place]  1. Global hypokinesis worse in the inferior base . Left ventricular ejection fraction, by estimation, is 30 to 35%. The left ventricle has moderately decreased function. The left ventricle has no regional wall motion abnormalities. The left ventricular internal cavity size was moderately dilated. Left ventricular diastolic parameters were normal.  2. Right ventricular systolic function is normal. The right ventricular size is normal.  3. Left atrial size was mildly dilated.  4. The mitral valve is abnormal. No evidence of mitral valve regurgitation. No evidence of mitral stenosis.  5. The aortic valve is tricuspid. There is mild calcification of the aortic valve. Aortic valve regurgitation is not visualized. Aortic valve sclerosis is present, with no evidence of aortic valve stenosis.  6. The inferior vena cava is normal in size with greater than 50% respiratory variability, suggesting right atrial pressure of 3 mmHg. FINDINGS  Left Ventricle: Global hypokinesis worse in the inferior base. Left ventricular ejection fraction, by estimation, is 30 to 35%. The left ventricle has moderately decreased function. The left ventricle has no regional wall motion abnormalities. Definity contrast agent was given IV to delineate the left ventricular endocardial borders. The left ventricular  internal cavity size  was moderately dilated. There is no left ventricular hypertrophy. Left ventricular diastolic parameters were normal. Right Ventricle: The right ventricular size is normal. No increase in right ventricular wall thickness. Right ventricular systolic function is normal. Left Atrium: Left atrial size was mildly dilated. Right Atrium: Right atrial size was normal in size. Pericardium: There is no evidence of pericardial effusion. Mitral Valve: The mitral valve is abnormal. There is mild thickening of the mitral valve leaflet(s). There is mild calcification of the mitral valve leaflet(s). No evidence of mitral valve regurgitation. No evidence of mitral valve stenosis. Tricuspid Valve: The tricuspid valve is normal in structure. Tricuspid valve regurgitation is not demonstrated. No evidence of tricuspid stenosis. Aortic Valve: The aortic valve is tricuspid. There is mild calcification of the aortic valve. Aortic valve regurgitation is not visualized. Aortic valve sclerosis is present, with no evidence of aortic valve stenosis. Aortic valve mean gradient measures 2.0 mmHg. Aortic valve peak gradient measures 2.5 mmHg. Aortic valve area, by VTI measures 3.10 cm. Pulmonic Valve: The pulmonic valve was normal in structure. Pulmonic valve regurgitation is not visualized. No evidence of pulmonic stenosis. Aorta: The aortic root is normal in size and structure. Venous: The inferior vena cava is normal in size with greater than 50% respiratory variability, suggesting right atrial pressure of 3 mmHg. IAS/Shunts: No atrial level shunt detected by color flow Doppler.  LEFT VENTRICLE PLAX 2D LVIDd:         5.05 cm      Diastology LVIDs:         4.20 cm      LV e' medial:    4.13 cm/s LV PW:         0.95 cm      LV E/e' medial:  11.6 LV IVS:        0.90 cm      LV e' lateral:   7.22 cm/s LVOT diam:     2.25 cm      LV E/e' lateral: 6.6 LV SV:         45 LV SV Index:   22 LVOT Area:     3.98 cm  LV Volumes (MOD) LV vol d, MOD A2C:  109.0 ml LV vol d, MOD A4C: 137.0 ml LV vol s, MOD A2C: 69.6 ml LV vol s, MOD A4C: 87.3 ml LV SV MOD A2C:     39.4 ml LV SV MOD A4C:     137.0 ml LV SV MOD BP:      41.6 ml RIGHT VENTRICLE            IVC RV Basal diam:  3.40 cm    IVC diam: 1.00 cm RV Mid diam:    2.20 cm RV S prime:     7.62 cm/s LEFT ATRIUM             Index        RIGHT ATRIUM           Index LA diam:        3.65 cm 1.77 cm/m   RA Area:     15.30 cm LA Vol (A2C):   68.8 ml 33.44 ml/m  RA Volume:   36.10 ml  17.55 ml/m LA Vol (A4C):   50.3 ml 24.45 ml/m LA Biplane Vol: 60.0 ml 29.16 ml/m  AORTIC VALVE                    PULMONIC VALVE AV Area (Vmax):  3.64 cm     PV Vmax:       0.82 m/s AV Area (Vmean):   2.42 cm     PV Peak grad:  2.7 mmHg AV Area (VTI):     3.10 cm AV Vmax:           79.30 cm/s AV Vmean:          61.500 cm/s AV VTI:            0.146 m AV Peak Grad:      2.5 mmHg AV Mean Grad:      2.0 mmHg LVOT Vmax:         72.50 cm/s LVOT Vmean:        37.400 cm/s LVOT VTI:          0.114 m LVOT/AV VTI ratio: 0.78  AORTA Ao Root diam: 3.25 cm Ao Asc diam:  3.00 cm MITRAL VALVE MV Area (PHT): 3.99 cm    SHUNTS MV Decel Time: 190 msec    Systemic VTI:  0.11 m MV E velocity: 48.00 cm/s  Systemic Diam: 2.25 cm MV A velocity: 42.40 cm/s MV E/A ratio:  1.13 Jenkins Rouge MD Electronically signed by Jenkins Rouge MD Signature Date/Time: 05/27/2021/9:50:41 AM    Final    VAS US CAROTID  Result Date: 05/28/2021 Carotid Arterial Duplex Study Patient Name:  Douglas Carr [facility]  Date of Exam:   05/27/2021 Medical Rec #: [id number]            Accession #:    [id number] Date of Birth: [birth year]            Patient Gender: M Patient Age:   53 years Exam Location:  [facility] Procedure:      VAS US CAROTID Referring Phys: Orma Flaming --------------------------------------------------------------------------------  Indications:       Syncope. Risk Factors:      Hypertension, hyperlipidemia, Diabetes. Comparison Study:  prior 01/26/20 rt  occluded ICA lt ICA 40-59% Performing Technologist: Archie Patten RVS  Examination Guidelines: A complete evaluation includes B-mode imaging, spectral Doppler, color Doppler, and power Doppler as needed of all accessible portions of each vessel. Bilateral testing is considered an integral part of a complete examination. Limited examinations for reoccurring indications may be performed as noted.  Right Carotid Findings: +----------+--------+--------+--------+------------------+--------+             PSV cm/s EDV cm/s Stenosis Plaque Description Comments  +----------+--------+--------+--------+------------------+--------+  CCA Prox                     Occluded                              +----------+--------+--------+--------+------------------+--------+  CCA Mid                      Occluded                              +----------+--------+--------+--------+------------------+--------+  CCA Distal                   Occluded                              +----------+--------+--------+--------+------------------+--------+  ICA Prox  Occluded                              +----------+--------+--------+--------+------------------+--------+  ICA Mid                      Occluded                              +----------+--------+--------+--------+------------------+--------+  ICA Distal                   Occluded                              +----------+--------+--------+--------+------------------+--------+  ECA        117      24                                             +----------+--------+--------+--------+------------------+--------+ +----------+--------+-------+--------+-------------------+             PSV cm/s EDV cms Describe Arm Pressure (mmHG)  +----------+--------+-------+--------+-------------------+  Subclavian 60                                             +----------+--------+-------+--------+-------------------+ +---------+--------+--------+--------------+  Vertebral PSV cm/s EDV  cm/s Not identified  +---------+--------+--------+--------------+  Left Carotid Findings: +---------+--------+-------+--------+---------------------------------+--------+            PSV cm/s EDV     Stenosis Plaque Description                Comments                      cm/s                                                         +---------+--------+-------+--------+---------------------------------+--------+  CCA Prox  117      25               heterogenous                                +---------+--------+-------+--------+---------------------------------+--------+  CCA       93       20               heterogenous and calcific                    Distal                                                                          +---------+--------+-------+--------+---------------------------------+--------+  ICA Prox  185      78  60-79%   heterogenous, calcific and                                                       irregular                                   +---------+--------+-------+--------+---------------------------------+--------+  ICA Mid   202      77                                                           +---------+--------+-------+--------+---------------------------------+--------+  ICA       174      58                                                            Distal                                                                          +---------+--------+-------+--------+---------------------------------+--------+  ECA       144      19                                                           +---------+--------+-------+--------+---------------------------------+--------+ +----------+--------+--------+--------+-------------------+             PSV cm/s EDV cm/s Describe Arm Pressure (mmHG)  +----------+--------+--------+--------+-------------------+  Subclavian 212                                             +----------+--------+--------+--------+-------------------+  +---------+--------+--------+--------------+  Vertebral PSV cm/s EDV cm/s Not identified  +---------+--------+--------+--------------+   Summary: Right Carotid: Evidence consistent with a total occlusion of the right ICA. Left Carotid: Velocities in the left ICA are consistent with a 60-79% stenosis. Vertebrals: Bilateral vertebral arteries were not visualized. *See table(s) above for measurements and observations.  Electronically signed by Orlie Pollen on 05/28/2021 at 6:51:43 AM.    Final      Assessment and Plan:   Syncope - +orthostatic BP changes, seem to be enough to cause a fall, brief LOC - no reason for syncope based on device interrogation - spoke with the pharmacist, based on his med list, rec holding potential causing meds. Start with Flomax, then Entresto, then Coreg.  - however, if Flomax is needed to control BPH, will leave to IM  to  - HR did not change during +orthostatics, but is variable enough that chronotropic incompetence not likely.  - may also have autonomic dysfunction at baseline, could do compression socks, then elevate the head of the bed, then abd binder to control sx.  - discuss options w/ MD   Risk Assessment/Risk Scores:       For questions or updates, please contact [place] Please consult [email or web address] for contact info under    Signed, Rosaria Ferries, PA-C  05/28/2021 4:15 PM

## 2021-05-28 NOTE — Progress Notes (Signed)
PROGRESS NOTE    Douglas Carr   ZOX:[id number]  DOB: [birth year]  DOA: 05/26/2021 PCP: [name], [name], MD   Brief Narrative:  Douglas Carr is a 56 year old man with coronary artery disease status post right carotid endarterectomy and persistent left carotid stenosis, hypertension coronary artery disease status post CABG, combined systolic and diastolic heart failure with ICD diabetes mellitus, chronic kidney disease who presents to the hospital for a syncopal episode upon waking up and walking to the dresser. Initial BP reading in the ED was 107/51.  He states that he was out of his medications for few days but just restarted them.  Subjective: He had no complaints this AM when I evaluated him. We discussed his syncopal episode. He feels it was caused by not drinking enough fluid and sitting up in a chair for long time.     Assessment & Plan:   Principal Problem:   Syncope and collapse -Given IV fluids-although orthostatic vitals are still positive, he is ambulating > 400 ft  - Carotid ultrasound has been ordered and shows chronic right-sided stenosis but slightly increased left-sided stenosis which is 60 to 79%. - I have asked vascular for an opinion- CTA ordered by vascular- recommendation are to f/u in 3 mo, high intensity statin and dual antiplatelet agents. - I have also requested cardiology input.   Active Problems:    Chronic combined systolic and diastolic HF (heart failure), 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 -The echo has been repeated and does not show any change from prior echo-EF is 30 to 35% - Currently on 1/2 of home dose of Coreg - Entresto on hold- He takes lasix PRN and says he has not been taking it recently    Coronary artery disease involving native coronary artery of native heart with angina pectoris (HCC) Peripheral vascular disease -prior carotid endarterectomy -Continue aspirin and Plavix  Diabetes mellitus, uncontrolled with  hyperglycemia - Semaglutide 8 U and low does SSI being given - sugars quite stable - A1c checked on 1/22 and was 10.5     DVT prophylaxis: Ambulatory Code Status: Full code Level of Care: Level of care: Telemetry Cardiac Disposition Plan:  Status is: Observation   Antimicrobials:  Anti-infectives (From admission, onward)    None        Objective: Vitals:   05/27/21 0429 05/27/21 2100 05/28/21 0500 05/28/21 1154  BP:  127/73 118/70 (!) 161/83  Pulse: 74 93 68 91  Resp: 14 20 18 18   Temp:  98.6 F (37 C) 97.7 F (36.5 C) 97.6 F (36.4 C)  TempSrc:  Oral Oral Oral  SpO2: 97%     Weight: 87.7 kg     Height:       No intake or output data in the 24 hours ending 05/28/21 1840  Filed Weights   05/26/21 1854 05/26/21 2005 05/27/21 0429  Weight: 86.7 kg 85.3 kg 87.7 kg    Examination: General exam: Appears comfortable  Respiratory system: Clear to auscultation. Respiratory effort normal. Cardiovascular system: S1 & S2 heard, regular rate and rhythm Gastrointestinal system: Abdomen soft, non-tender, nondistended. Normal bowel sounds   Central nervous system: Alert and oriented. No focal neurological deficits. Extremities: No cyanosis, clubbing or edema  Data Reviewed: I have personally reviewed following labs and imaging studies  CBC: Recent Labs  Lab 05/26/21 1106 05/27/21 0303  WBC 12.5* 16.8*  HGB 14.1 13.5  HCT 43.4 40.7  MCV 84.3 84.3  PLT 214 190  Basic Metabolic Panel: Recent Labs  Lab 05/26/21 1106 05/26/21 1957 05/27/21 0303 05/28/21 0323  NA 135 141 135 138  K 5.3* 6.3* 4.6 4.2  CL 104 108 109 108  CO2 22 24 22 25   GLUCOSE 323* 225* 224* 147*  BUN 45* 41* 39* 27*  CREATININE 1.54* 1.54* 1.36* 1.30*  CALCIUM 9.0 9.1 8.4* 8.4*    GFR: Estimated Creatinine Clearance: 72.5 mL/min (A) (by C-G formula based on SCr of 1.3 mg/dL (H)). Liver Function Tests: No results for input(s): AST, ALT, ALKPHOS, BILITOT, PROT, ALBUMIN in the last  168 hours. No results for input(s): LIPASE, AMYLASE in the last 168 hours. No results for input(s): AMMONIA in the last 168 hours. Coagulation Profile: No results for input(s): INR, PROTIME in the last 168 hours. Cardiac Enzymes: No results for input(s): CKTOTAL, CKMB, CKMBINDEX, TROPONINI in the last 168 hours. BNP (last 3 results) No results for input(s): PROBNP in the last 8760 hours. HbA1C: Recent Labs    05/26/21 1957  HGBA1C 10.5*   CBG: Recent Labs  Lab 05/27/21 2132 05/28/21 0522 05/28/21 0749 05/28/21 1153 05/28/21 1621  GLUCAP 201* 144* 148* 216* 182*    Lipid Profile: No results for input(s): CHOL, HDL, LDLCALC, TRIG, CHOLHDL, LDLDIRECT in the last 72 hours. Thyroid Function Tests: No results for input(s): TSH, T4TOTAL, FREET4, T3FREE, THYROIDAB in the last 72 hours. Anemia Panel: No results for input(s): VITAMINB12, FOLATE, FERRITIN, TIBC, IRON, RETICCTPCT in the last 72 hours. Urine analysis:    Component Value Date/Time   COLORURINE YELLOW 05/26/2021 1415   APPEARANCEUR CLEAR 05/26/2021 1415   LABSPEC 1.017 05/26/2021 1415   PHURINE 6.5 05/26/2021 1415   GLUCOSEU >1,000 (A) 05/26/2021 1415   HGBUR NEGATIVE 05/26/2021 1415   BILIRUBINUR NEGATIVE 05/26/2021 1415   KETONESUR NEGATIVE 05/26/2021 1415   PROTEINUR 30 (A) 05/26/2021 1415   UROBILINOGEN 0.2 08/19/2011 2051   NITRITE NEGATIVE 05/26/2021 1415   LEUKOCYTESUR NEGATIVE 05/26/2021 1415   Sepsis Labs: @LABRCNTIP (procalcitonin:4,lacticidven:4) ) Recent Results (from the past 240 hour(s))  Resp Panel by RT-PCR (Flu A&B, Covid) Nasopharyngeal Swab     Status: None   Collection Time: 05/26/21  2:00 PM   Specimen: Nasopharyngeal Swab; Nasopharyngeal(NP) swabs in vial transport medium  Result Value Ref Range Status   SARS Coronavirus 2 by RT PCR NEGATIVE NEGATIVE Final    Comment: (NOTE) SARS-CoV-2 target nucleic acids are NOT DETECTED.  The SARS-CoV-2 RNA is generally detectable in upper  respiratory specimens during the acute phase of infection. The lowest concentration of SARS-CoV-2 viral copies this assay can detect is 138 copies/mL. A negative result does not preclude SARS-Cov-2 infection and should not be used as the sole basis for treatment or other patient management decisions. A negative result may occur with  improper specimen collection/handling, submission of specimen other than nasopharyngeal swab, presence of viral mutation(s) within the areas targeted by this assay, and inadequate number of viral copies(<138 copies/mL). A negative result must be combined with clinical observations, patient history, and epidemiological information. The expected result is Negative.  Fact Sheet for Patients:  BloggerCourse.com  Fact Sheet for Healthcare Providers:  SeriousBroker.it  This test is no t yet approved or cleared by the [place] FDA and  has been authorized for detection and/or diagnosis of SARS-CoV-2 by FDA under an Emergency Use Authorization (EUA). This EUA will remain  in effect (meaning this test can be used) for the duration of the COVID-19 declaration under Section 564(b)(1) of the Act, 21 U.S.C.section  360bbb-3(b)(1), unless the authorization is terminated  or revoked sooner.       Influenza A by PCR NEGATIVE NEGATIVE Final   Influenza B by PCR NEGATIVE NEGATIVE Final    Comment: (NOTE) The Xpert Xpress SARS-CoV-2/FLU/RSV plus assay is intended as an aid in the diagnosis of influenza from Nasopharyngeal swab specimens and should not be used as a sole basis for treatment. Nasal washings and aspirates are unacceptable for Xpert Xpress SARS-CoV-2/FLU/RSV testing.  Fact Sheet for Patients: BloggerCourse.com  Fact Sheet for Healthcare Providers: SeriousBroker.it  This test is not yet approved or cleared by the [place] FDA and has been  authorized for detection and/or diagnosis of SARS-CoV-2 by FDA under an Emergency Use Authorization (EUA). This EUA will remain in effect (meaning this test can be used) for the duration of the COVID-19 declaration under Section 564(b)(1) of the Act, 21 U.S.C. section 360bbb-3(b)(1), unless the authorization is terminated or revoked.  Performed at [facility], [address], [place], [place] [address]          Radiology Studies: CT ANGIO HEAD NECK W WO CM  Result Date: 05/27/2021 CLINICAL DATA:  Evaluate vertebral arteries EXAM: CT ANGIOGRAPHY HEAD AND NECK TECHNIQUE: Multidetector CT imaging of the head and neck was performed using the standard protocol during bolus administration of intravenous contrast. Multiplanar CT image reconstructions and MIPs were obtained to evaluate the vascular anatomy. Carotid stenosis measurements (when applicable) are obtained utilizing NASCET criteria, using the distal internal carotid diameter as the denominator. RADIATION DOSE REDUCTION: This exam was performed according to the departmental dose-optimization program which includes automated exposure control, adjustment of the mA and/or kV according to patient size and/or use of iterative reconstruction technique. CONTRAST:  75mL OMNIPAQUE IOHEXOL 350 MG/ML SOLN COMPARISON:  06/09/2016 CTA head, correlation is also made with CT head 05/26/2021 FINDINGS: CT HEAD FINDINGS Brain: No evidence of acute infarction, hemorrhage, cerebral edema, mass, mass effect, or midline shift. No hydrocephalus or extra-axial fluid collection. Unchanged size and configuration of the ventricles. Redemonstrated hypodensity in the left frontal and parietal lobe, unchanged. Vascular: No hyperdense vessel. Skull: Normal. Negative for fracture or focal lesion. Sinuses/Orbits: No acute finding. Status post bilateral lens replacements. Other: The mastoid air cells are well aerated. CTA NECK FINDINGS Aortic arch:  Two-vessel arch with a common origin of the brachiocephalic and left common carotid arteries. Imaged portion shows no evidence of aneurysm or dissection. Aortic atherosclerosis, which extends into the origins of the branch vessels, without hemodynamically significant stenosis. 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 The distal right ICA was noted to be occluded on the 2018 CTA, but no prior imaging of the neck is available. Left carotid system: Mild narrowing of the distal left CCA, secondary to calcified and noncalcified plaque. Calcified and noncalcified plaque at the bifurcation and in the proximal left ICA which causes up to 70% stenosis (series 9, images 197-225). Vertebral arteries: The left vertebral artery is not visualized in the V1 and V2 segments, with distal reconstitution at the level of C3 (series 9, image 214), secondary to collaterals (series 9, image 206). The remainder of the V2 and V3 segments demonstrate mild calcification and multifocal mild narrowing. The right vertebral artery is not visualized in the V1 and majority of the V2 segment, with faint opacification in the distal V2, with reconstitution distally in the mid V3 segment (series 9, image 168) via a collateral vessel (  series 6, image 167). Skeleton: Prior median sternotomy. Mild degenerative changes in the cervical spine. No acute osseous abnormality. Other neck: No acute finding. Upper chest: No focal parenchymal opacity. Mild peribronchial thickening. No pleural effusion. Review of the MIP images confirms the above findings CTA HEAD FINDINGS Anterior circulation: Non opacification of the intracranial right ICA in the petrous portion, with minimal opacification in the cavernous and supraclinoid segment, although the vessel remains diminutive, likely secondary to atherosclerotic disease. The intracranial left ICA is patent, with mild  narrowing in the distal petrous portion (series 9, image 138 and 137). A1 segments patent. Normal anterior communicating artery. Anterior cerebral arteries are patent to their distal aspects. No M1 stenosis or occlusion. Normal MCA bifurcations. Distal MCA branches are perfused without focal stenosis. Posterior circulation: Vertebral arteries patent to the vertebrobasilar junction without stenosis. Posterior inferior cerebral arteries patent proximally, however the right PICA demonstrates focal non opacification just distal to its takeoff (series 9, image 146), with distal reconstitution. Basilar patent to its distal aspect. Superior cerebellar arteries patent proximally. Bilateral P1 segments originate from the basilar artery. PCAs perfused but irregular. Diminutive bilateral posterior communicating arteries are visualized. Venous sinuses: As permitted by contrast timing, patent. Anatomic variants: None significant Review of the MIP images confirms the above findings IMPRESSION: 1. 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 The intracranial portion was noted to be occluded on 06/09/2016; although there is no prior imaging of the cervical ICA or CCA, this is likely chronic. 2. Approximately 70% stenosis in the proximal left ICA. 3. 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 4. Occlusion of the right vertebral artery from its origin to the mid V3 segment, where it is reconstituted via collateral vessel, with minimal faint opacification in the distal V2 and proximal V3, likely retrograde. 5. Focal stenosis in the right PICA, just distal to its takeoff. 6. Mild narrowing in the distal petrous left ICA. These results were called by telephone at the time of interpretation  on 05/27/2021 at 10:36 pm to provider Nurse Dorian HeckleKathy Robinson, for Dr. Gerarda FractionJOSHUA ROBINS , who verbally acknowledged these results. Electronically Signed   By: Wiliam KeAlison  Vasan M.D.   On: 05/27/2021 22:36   ECHOCARDIOGRAM COMPLETE  Result Date: 05/27/2021    ECHOCARDIOGRAM REPORT   Patient Name:   Margarita SermonsCLIFFORD M Reier Date of Exam: 05/27/2021 Medical Rec #:  [id number]           Height:       70.0 in Accession #:    [id number]          Weight:       193.3 lb Date of Birth:  1966-02-24           BSA:          2.058 m Patient Age:    55 years            BP:           158/82 mmHg Patient Gender: M                   HR:           66 bpm. Exam Location:  Inpatient Procedure: 2D Echo, Cardiac Doppler and Color Doppler Indications:    R55  Syncope  History:        Patient has prior history of Echocardiogram examinations, most                 recent 01/23/2020. Cardiomyopathy, CAD and Previous Myocardial                 Infarction, Defibrillator, Signs/Symptoms:Syncope; Risk                 Factors:Hypertension and Dyslipidemia.  Sonographer:    Vanetta Shawl Referring Phys: [id number] ALLISON WOLFE IMPRESSIONS  1. Global hypokinesis worse in the inferior base . Left ventricular ejection fraction, by estimation, is 30 to 35%. The left ventricle has moderately decreased function. The left ventricle has no regional wall motion abnormalities. The left ventricular internal cavity size was moderately dilated. Left ventricular diastolic parameters were normal.  2. Right ventricular systolic function is normal. The right ventricular size is normal.  3. Left atrial size was mildly dilated.  4. The mitral valve is abnormal. No evidence of mitral valve regurgitation. No evidence of mitral stenosis.  5. The aortic valve is tricuspid. There is mild calcification of the aortic valve. Aortic valve regurgitation is not visualized. Aortic valve sclerosis is present, with no evidence of aortic valve stenosis.  6. The inferior vena cava is normal in size  with greater than 50% respiratory variability, suggesting right atrial pressure of 3 mmHg. FINDINGS  Left Ventricle: Global hypokinesis worse in the inferior base. Left ventricular ejection fraction, by estimation, is 30 to 35%. The left ventricle has moderately decreased function. The left ventricle has no regional wall motion abnormalities. Definity contrast agent was given IV to delineate the left ventricular endocardial borders. The left ventricular internal cavity size was moderately dilated. There is no left ventricular hypertrophy. Left ventricular diastolic parameters were normal. Right Ventricle: The right ventricular size is normal. No increase in right ventricular wall thickness. Right ventricular systolic function is normal. Left Atrium: Left atrial size was mildly dilated. Right Atrium: Right atrial size was normal in size. Pericardium: There is no evidence of pericardial effusion. Mitral Valve: The mitral valve is abnormal. There is mild thickening of the mitral valve leaflet(s). There is mild calcification of the mitral valve leaflet(s). No evidence of mitral valve regurgitation. No evidence of mitral valve stenosis. Tricuspid Valve: The tricuspid valve is normal in structure. Tricuspid valve regurgitation is not demonstrated. No evidence of tricuspid stenosis. Aortic Valve: The aortic valve is tricuspid. There is mild calcification of the aortic valve. Aortic valve regurgitation is not visualized. Aortic valve sclerosis is present, with no evidence of aortic valve stenosis. Aortic valve mean gradient measures 2.0 mmHg. Aortic valve peak gradient measures 2.5 mmHg. Aortic valve area, by VTI measures 3.10 cm. Pulmonic Valve: The pulmonic valve was normal in structure. Pulmonic valve regurgitation is not visualized. No evidence of pulmonic stenosis. Aorta: The aortic root is normal in size and structure. Venous: The inferior vena cava is normal in size with greater than 50% respiratory variability,  suggesting right atrial pressure of 3 mmHg. IAS/Shunts: No atrial level shunt detected by color flow Doppler.  LEFT VENTRICLE PLAX 2D LVIDd:         5.05 cm      Diastology LVIDs:         4.20 cm      LV e' medial:    4.13 cm/s LV PW:         0.95 cm      LV E/e' medial:  11.6 LV IVS:        0.90 cm      LV e' lateral:   7.22 cm/s LVOT diam:     2.25 cm      LV E/e' lateral: 6.6 LV SV:         45 LV SV Index:   22 LVOT Area:     3.98 cm  LV Volumes (MOD) LV vol d, MOD A2C: 109.0 ml LV vol d, MOD A4C: 137.0 ml LV vol s, MOD A2C: 69.6 ml LV vol s, MOD A4C: 87.3 ml LV SV MOD A2C:     39.4 ml LV SV MOD A4C:     137.0 ml LV SV MOD BP:      41.6 ml RIGHT VENTRICLE            IVC RV Basal diam:  3.40 cm    IVC diam: 1.00 cm RV Mid diam:    2.20 cm RV S prime:     7.62 cm/s LEFT ATRIUM             Index        RIGHT ATRIUM           Index LA diam:        3.65 cm 1.77 cm/m   RA Area:     15.30 cm LA Vol (A2C):   68.8 ml 33.44 ml/m  RA Volume:   36.10 ml  17.55 ml/m LA Vol (A4C):   50.3 ml 24.45 ml/m LA Biplane Vol: 60.0 ml 29.16 ml/m  AORTIC VALVE                    PULMONIC VALVE AV Area (Vmax):    3.64 cm     PV Vmax:       0.82 m/s AV Area (Vmean):   2.42 cm     PV Peak grad:  2.7 mmHg AV Area (VTI):     3.10 cm AV Vmax:           79.30 cm/s AV Vmean:          61.500 cm/s AV VTI:            0.146 m AV Peak Grad:      2.5 mmHg AV Mean Grad:      2.0 mmHg LVOT Vmax:         72.50 cm/s LVOT Vmean:        37.400 cm/s LVOT VTI:          0.114 m LVOT/AV VTI ratio: 0.78  AORTA Ao Root diam: 3.25 cm Ao Asc diam:  3.00 cm MITRAL VALVE MV Area (PHT): 3.99 cm    SHUNTS MV Decel Time: 190 msec    Systemic VTI:  0.11 m MV E velocity: 48.00 cm/s  Systemic Diam: 2.25 cm MV A velocity: 42.40 cm/s MV E/A ratio:  1.13 Charlton Haws MD Electronically signed by Charlton Haws MD Signature Date/Time: 05/27/2021/9:50:41 AM    Final    VAS US CAROTID  Result Date: 05/28/2021 Carotid Arterial Duplex Study Patient Name:  JAYQUAN BRADSHER  [facility]  Date of Exam:   05/27/2021 Medical Rec #: [id number]            Accession #:    [id number] Date of Birth: May 14, 1965            Patient Gender: M Patient Age:   48 years Exam Location:  [facility] Procedure:      VAS  US CAROTID Referring Phys: Orland Mustard --------------------------------------------------------------------------------  Indications:       Syncope. Risk Factors:      Hypertension, hyperlipidemia, Diabetes. Comparison Study:  prior 01/26/20 rt occluded ICA lt ICA 40-59% Performing Technologist: Argentina Ponder RVS  Examination Guidelines: A complete evaluation includes B-mode imaging, spectral Doppler, color Doppler, and power Doppler as needed of all accessible portions of each vessel. Bilateral testing is considered an integral part of a complete examination. Limited examinations for reoccurring indications may be performed as noted.  Right Carotid Findings: +----------+--------+--------+--------+------------------+--------+             PSV cm/s EDV cm/s Stenosis Plaque Description Comments  +----------+--------+--------+--------+------------------+--------+  CCA Prox                     Occluded                              +----------+--------+--------+--------+------------------+--------+  CCA Mid                      Occluded                              +----------+--------+--------+--------+------------------+--------+  CCA Distal                   Occluded                              +----------+--------+--------+--------+------------------+--------+  ICA Prox                     Occluded                              +----------+--------+--------+--------+------------------+--------+  ICA Mid                      Occluded                              +----------+--------+--------+--------+------------------+--------+  ICA Distal                   Occluded                              +----------+--------+--------+--------+------------------+--------+  ECA        117      24                                              +----------+--------+--------+--------+------------------+--------+ +----------+--------+-------+--------+-------------------+             PSV cm/s EDV cms Describe Arm Pressure (mmHG)  +----------+--------+-------+--------+-------------------+  Subclavian 60                                             +----------+--------+-------+--------+-------------------+ +---------+--------+--------+--------------+  Vertebral PSV cm/s EDV cm/s Not identified  +---------+--------+--------+--------------+  Left Carotid Findings: +---------+--------+-------+--------+---------------------------------+--------+            PSV cm/s EDV  Stenosis Plaque Description                Comments                      cm/s                                                         +---------+--------+-------+--------+---------------------------------+--------+  CCA Prox  117      25               heterogenous                                +---------+--------+-------+--------+---------------------------------+--------+  CCA       93       20               heterogenous and calcific                    Distal                                                                          +---------+--------+-------+--------+---------------------------------+--------+  ICA Prox  185      78      60-79%   heterogenous, calcific and                                                       irregular                                   +---------+--------+-------+--------+---------------------------------+--------+  ICA Mid   202      77                                                           +---------+--------+-------+--------+---------------------------------+--------+  ICA       174      58                                                            Distal                                                                          +---------+--------+-------+--------+---------------------------------+--------+  ECA        144      19                                                           +---------+--------+-------+--------+---------------------------------+--------+ +----------+--------+--------+--------+-------------------+             PSV cm/s EDV cm/s Describe Arm Pressure (mmHG)  +----------+--------+--------+--------+-------------------+  Subclavian 212                                             +----------+--------+--------+--------+-------------------+ +---------+--------+--------+--------------+  Vertebral PSV cm/s EDV cm/s Not identified  +---------+--------+--------+--------------+   Summary: Right Carotid: Evidence consistent with a total occlusion of the right ICA. Left Carotid: Velocities in the left ICA are consistent with a 60-79% stenosis. Vertebrals: Bilateral vertebral arteries were not visualized. *See table(s) above for measurements and observations.  Electronically signed by Gerarda FractionJoshua Robins on 05/28/2021 at 6:51:43 AM.    Final       Scheduled Meds:  aspirin EC  81 mg Oral Daily   atorvastatin  80 mg Oral QPM   carvedilol  3.125 mg Oral Q12H   clopidogrel  75 mg Oral QPM   diclofenac Sodium  2 g Topical QID   DULoxetine  120 mg Oral Daily   enoxaparin (LOVENOX) injection  40 mg Subcutaneous Q24H   insulin aspart  0-9 Units Subcutaneous TID WC   insulin glargine-yfgn  8 Units Subcutaneous QHS   mirtazapine  7.5 mg Oral QHS   primidone  50 mg Oral QHS   sodium chloride flush  3 mL Intravenous Q12H   sodium chloride flush  3 mL Intravenous Q12H   Continuous Infusions:  sodium chloride       LOS: 0 days      Calvert CantorSaima Marbeth Smedley, MD [facility] Pager: [email or web address] 05/28/2021, 6:40 PM

## 2021-05-28 NOTE — Progress Notes (Signed)
Inpatient Diabetes Program Recommendations  AACE/ADA: New Consensus Statement on Inpatient Glycemic Control (2015)  Target Ranges:  Prepandial:   less than 140 mg/dL      Peak postprandial:   less than 180 mg/dL (1-2 hours)      Critically ill patients:  140 - 180 mg/dL   Lab Results  Component Value Date   GLUCAP 216 (H) 05/28/2021   HGBA1C 10.5 (H) 05/26/2021    Review of Glycemic Control  Latest Reference Range & Units 05/27/21 21:32 05/28/21 05:22 05/28/21 07:49 05/28/21 11:53  Glucose-Capillary 70 - 99 mg/dL 742 (H) 595 (H) 638 (H) 216 (H)  (H): Data is abnormally high Diabetes history: Type 2 DM Outpatient Diabetes medications: Ozempic 2 mg Qwk (NT), Evaristo Bury PRN Current orders for Inpatient glycemic control: Semglee 8 units QHS, Novolog 0-9 units TID  Inpatient Diabetes Program Recommendations:    Spoke with patient and wife at length regarding outpatient diabetes management. Verified his home medications and after further questioning patient has not been taking Ozempic consistently, will also take [place] intermittently at varying doses "to try to get my sugar down since it works".  Reviewed patient's current A1c of 10.5%. Explained what a A1c is and what it measures. Also reviewed goal A1c with patient, importance of good glucose control @ home, and blood sugar goals. Reviewed patho of DM, role of pancreas, impact of poor glycemic control from cardiac perspective, vascular changes, differences between Ozempic and long acting insulin, and other commorbidities. Patient has a meter and reports checking blood sugar but not consistently. Reviewed recommended frequency and when to call MD. Patient has a PCP appointment next week. Has attempted to see outpatient endocrinology but had a poor experience. Will attach endo list to DC summary.  Patient admits to consuming large amount of CHO snacks and knows what he should be doing. Gets easily frustrated with wife reminding him of medications  and what not to eat. Encouragement provided on alternatives. In the past, PCP prescribed Tresiba 110-160 units QD. Has not taking this much "in awhile" and it sounds like some of this dose was to help cover the [facility] with poor food choices even though patient denies. Also, discussed varying methods of setting up medications in home and setting alarms to help assist with compliance. Patient amendable.  Additionally, patient has medications sent to home in packages. Feel that QD dosing would be much more beneficial to patient. Could consider Rybelsus 7 mg QD in addition to set dose of Tresiba QD. Secure chat sent to MD with recommendations.   Thanks, Lujean Rave, MSN, RNC-OB Diabetes Coordinator [phone] (8a-5p)

## 2021-05-28 NOTE — Discharge Instructions (Addendum)
Discuss with primary care provider: The idea we discussed of daily dose of GLP-1: Rybelsus 7 mg QD to ensure taking daily   Local Endocrinologists [facility] [phone]) Dr. Carlus Pavlov Dr[facility] [phone]) Dr. Talmage Coin [facility] [phone]) Dr. Dorisann Frames Dr. Darci Needle [facility] [phone]- [phone]) Dr. Deirdre Pippins [facility] [phone]) [[place] office]  [phone]) Dan Humphreys office] Dr. Wendall Mola Dr. Verdis Frederickson [facility]) [phone]) Autumn Pearletha Forge Yetta Barre), PA Dr. Izell [place] Dr. Jillyn Ledger. [facility] [phone]) Dr. Marquis Lunch [facility] [phone]) Dr. Jerelyn Scott Dr. Dessa Phi Dr. Gerome Sam, FNP Dr. Girtha Hake. [place] in [place] [phone])

## 2021-05-28 NOTE — Progress Notes (Signed)
Mobility Specialist Progress Note    05/28/21 1334  Mobility  Bed Position Chair  Activity Ambulated independently in hallway  Level of Assistance Contact guard assist, steadying assist  Assistive Device None  Distance Ambulated (ft) 480 ft  Activity Response Tolerated fair  $Mobility charge 1 Mobility   Pre-Mobility: 87 HR, 146/71 BP Post-Mobility: 92 HR, 187/54 BP  Pt received in chair and agreeable. No complaints on walk. Returned to chair with call bell in reach.   [facility] Mobility Specialist  M.S. 2C and 6E: [phone] M.S. 4E: [phone]

## 2021-05-29 DIAGNOSIS — E1169 Type 2 diabetes mellitus with other specified complication: Secondary | ICD-10-CM | POA: Diagnosis not present

## 2021-05-29 DIAGNOSIS — I5042 Chronic combined systolic (congestive) and diastolic (congestive) heart failure: Secondary | ICD-10-CM

## 2021-05-29 DIAGNOSIS — I6523 Occlusion and stenosis of bilateral carotid arteries: Secondary | ICD-10-CM | POA: Diagnosis not present

## 2021-05-29 DIAGNOSIS — I951 Orthostatic hypotension: Secondary | ICD-10-CM | POA: Diagnosis not present

## 2021-05-29 DIAGNOSIS — E669 Obesity, unspecified: Secondary | ICD-10-CM

## 2021-05-29 DIAGNOSIS — R55 Syncope and collapse: Secondary | ICD-10-CM | POA: Diagnosis not present

## 2021-05-29 LAB — GLUCOSE, CAPILLARY
Glucose-Capillary: 254 mg/dL — ABNORMAL HIGH (ref 70–99)
Glucose-Capillary: 131 mg/dL — ABNORMAL HIGH (ref 70–99)

## 2021-05-29 MED ORDER — CARVEDILOL 3.125 MG PO TABS: 3.1250 mg | ORAL_TABLET | Freq: Two times a day (BID) | ORAL | 1 refills | Status: DC

## 2021-05-29 MED ORDER — INSULIN GLARGINE-YFGN 100 UNIT/ML ~~LOC~~ SOLN: 8.0000 [IU] | Freq: Every day | SUBCUTANEOUS | 11 refills | Status: DC

## 2021-05-29 NOTE — Evaluation (Signed)
Physical Therapy Evaluation Patient Details Name: Douglas Carr MRN: [id number] DOB: 08/23/1965 Today's Date: 05/29/2021  History of Present Illness  Pt is 56 yo male admitted 05/26/21 with syncopal episode. Pt with orthostatic hypotension - cardiology consulted, meds changed, compression hose ordered.  Pt with hx of R carotid endarterectomy, HTN, CAD, CABG, CHF, ICD, DM, kidney disease, and CVA.  Clinical Impression  Pt admitted with above diagnosis.  He demonstrated safe gait, stairs, and transfers safely and at independent mobility level (did have supervision due to orthostatic hypotension admission).  Pt demonstrates mobility at baseline.  Pt did have a drop in bp consistent with orthostatic hypotension but he was asymptomatic and is aware of how to manage (slow transfers, sitting immediately if symptomatic, AROM exercises for blood flow) .  Pt does not require further skilled PT.   Orthostatic Bps were as follows: 168/72 supine  132/62 sitting 117/75 standing 98/78 walking 124/98 return to sitting Pt completely asymptomatic     Recommendations for follow up therapy are one component of a multi-disciplinary discharge planning process, led by the attending physician.  Recommendations may be updated based on patient status, additional functional criteria and insurance authorization.  Follow Up Recommendations No PT follow up    Assistance Recommended at Discharge PRN  Patient can return home with the following       Equipment Recommendations None recommended by PT  Recommendations for Other Services       Functional Status Assessment Patient has not had a recent decline in their functional status     Precautions / Restrictions Precautions Precautions: Fall Precaution Comments: watch BP      Mobility  Bed Mobility Overal bed mobility: Needs Assistance Bed Mobility: Supine to Sit     Supine to sit: Supervision          Transfers Overall transfer level: Needs  assistance Equipment used: None Transfers: Sit to/from Stand Sit to Stand: Supervision                Ambulation/Gait Ambulation/Gait assistance: Supervision Gait Distance (Feet): 500 Feet Assistive device: None Gait Pattern/deviations: WFL(Within Functional Limits)       General Gait Details: normal gait; no episodes/symptoms of syncope  Stairs Stairs: Yes Stairs assistance: Min guard Stair Management: One rail Right, Forwards, Alternating pattern Number of Stairs: 6 General stair comments: Min guard for safety but pt performed without assist, steady, and safe  Wheelchair Mobility    Modified Rankin (Stroke Patients Only)       Balance Overall balance assessment: Independent Sitting-balance support: No upper extremity supported Sitting balance-Leahy Scale: Normal     Standing balance support: No upper extremity supported Standing balance-Leahy Scale: Good                               Pertinent Vitals/Pain Pain Assessment Pain Assessment: No/denies pain    Home Living Family/patient expects to be discharged to:: Private residence Living Arrangements: Spouse/significant other Available Help at Discharge: Family;Available 24 hours/day Type of Home: House Home Access: Stairs to enter Entrance Stairs-Rails: Right;Left;Can reach both Entrance Stairs-Number of Steps: 4   Home Layout: One level Home Equipment: Grab bars - tub/shower;Rolling Walker (2 wheels);Cane - single point      Prior Function Prior Level of Function : Independent/Modified Independent;Driving             Mobility Comments: ambulates in community; just took grandson to [employer] and walked ADLs Comments:  Independent with ADLs and IADLs     Hand Dominance        Extremity/Trunk Assessment   Upper Extremity Assessment Upper Extremity Assessment: Overall WFL for tasks assessed    Lower Extremity Assessment Lower Extremity Assessment: Overall WFL for tasks  assessed (.)    Cervical / Trunk Assessment Cervical / Trunk Assessment: Normal  Communication   Communication: No difficulties  Cognition Arousal/Alertness: Awake/alert Behavior During Therapy: WFL for tasks assessed/performed Overall Cognitive Status: Within Functional Limits for tasks assessed                                          General Comments General comments (skin integrity, edema, etc.): Pt aware of orthostatic hypotension and how to manage.  Reports he normally takes his time with transfers but had hopped up too quick in the morning.  Reports he is getting compression hose.  Also , educated on AROM exercises in sitting to promote blood flow before standing then activities in standing before walking.    Exercises     Assessment/Plan    PT Assessment Patient does not need any further PT services  PT Problem List         PT Treatment Interventions      PT Goals (Current goals can be found in the Care Plan section)  Acute Rehab PT Goals Patient Stated Goal: go home PT Goal Formulation: All assessment and education complete, DC therapy    Frequency       Co-evaluation               AM-PAC PT "6 Clicks" Mobility  Outcome Measure Help needed turning from your back to your side while in a flat bed without using bedrails?: None Help needed moving from lying on your back to sitting on the side of a flat bed without using bedrails?: None Help needed moving to and from a bed to a chair (including a wheelchair)?: None Help needed standing up from a chair using your arms (e.g., wheelchair or bedside chair)?: None Help needed to walk in hospital room?: None Help needed climbing 3-5 steps with a railing? : A Little 6 Click Score: 23    End of Session Equipment Utilized During Treatment: Gait belt Activity Tolerance: Patient tolerated treatment well Patient left: in chair;with call bell/phone within reach Nurse Communication: Mobility  status;Other (comment) (bp)      Time: 0258-5277 PT Time Calculation (min) (ACUTE ONLY): 22 min   Charges:   PT Evaluation $PT Eval Low Complexity: 1 Low          Jonelle Bann, PT Acute Rehab Services Pager [phone] [facility] [phone]   Rayetta Humphrey 05/29/2021, 10:57 AM

## 2021-05-29 NOTE — Discharge Summary (Signed)
Physician Discharge Summary  Douglas Carr:[id number] DOB: [birth year] DOA: 05/26/2021  PCP: Douglas Parisian Fransisca Kaufmann, MD  Admit date: 05/26/2021 Discharge date: 05/29/2021  Admitted From: [facility].  Disposition:  Home  Recommendations for Outpatient Follow-up:  Follow up with PCP in 1-2 weeks Please obtain BMP/CBC in one week Please follow-up with cardiology as scheduled. Please follow-up with vascular surgery in 3 months.   Discharge Condition:guarded.  CODE STATUS:full code.  Diet recommendation: Heart Healthy    Brief/Interim Summary: Douglas Carr is a 56 year old man with coronary artery disease status post right carotid endarterectomy and persistent left carotid stenosis, hypertension coronary artery disease status post CABG, combined systolic and diastolic heart failure with ICD diabetes mellitus, chronic kidney disease who presents to the hospital for a syncopal episode upon waking up and walking to the dresser.  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 - I have asked vascular for an opinion- CTA ordered by vascular- recommendation are to f/u in 3 mo, high intensity statin and dual  antiplatelet agents. -Cardiology consulted recommended holding Entresto, Lasix, Imdur and Flomax. Continue with thigh-high pression stockings. Therapy evaluation ordered.    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 -The echo has been repeated and does not show any change from prior echo-EF is 30 to 35% - Currently on 1/2 of home dose of Coreg - Entresto, Lasix, Imdur, Flomax have all been on hold for now     Coronary artery disease involving native coronary artery of native heart with angina pectoris (HCC) Peripheral vascular disease -prior carotid endarterectomy -Continue aspirin and Plavix   Diabetes mellitus, uncontrolled with hyperglycemia - Semaglutide 8 U and low does SSI being given - sugars quite stable - A1c checked on 1/22 and was 10.5    Discharge Instructions  Discharge Instructions     Diet - low sodium heart healthy   Complete by: As directed    Discharge instructions   Complete by: As directed    Please continue to wear thigh high compression stockings.  Please follow up with PCP in one week.   Increase activity slowly   Complete by: As directed       Allergies as of 05/29/2021   No Known Allergies      Medication List     STOP taking these medications    Entresto 24-26 MG Generic drug: sacubitril-valsartan   furosemide 40 MG tablet Commonly known as: LASIX   HYDROcodone bit-homatropine 5-1.5 MG/5ML syrup Commonly known as: HYCODAN   ibuprofen 200 MG tablet Commonly known as: ADVIL   isosorbide dinitrate 10 MG tablet Commonly known as: ISORDIL   levofloxacin 500  MG tablet Commonly known as: Levaquin   tamsulosin 0.4 MG Caps capsule Commonly known as: FLOMAX       TAKE these medications    albuterol 108 (90 Base) MCG/ACT inhaler Commonly known as: VENTOLIN HFA Inhale 2 puffs into the lungs every 6 (six) hours as needed for wheezing or shortness  of breath.   Aspirin Low Dose 81 MG EC tablet Generic drug: aspirin TAKE ONE TABLET BY MOUTH DAILY What changed: how much to take   atorvastatin 80 MG tablet Commonly known as: LIPITOR Take 1 tablet (80 mg total) by mouth every evening.   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 Dispense based on patient and insurance preference. Use up to four times daily as directed. (FOR ICD-9 250.00, 250.01).   carvedilol 3.125 MG tablet Commonly known as: COREG Take 1 tablet (3.125 mg total) by mouth every 12 (twelve) hours. What changed:  medication strength how much to take when to take this   clopidogrel 75 MG tablet Commonly known as: PLAVIX TAKE ONE TABLET BY MOUTH IN THE EVENING What changed:  how much to take how to take this when to take this additional instructions   DULoxetine 60 MG capsule Commonly known as: CYMBALTA TAKE TWO CAPSULES BY MOUTH DAILY   hydrOXYzine 25 MG tablet Commonly known as: ATARAX TAKE ONE TABLET BY MOUTH THREE TIMES DAILY AS NEEDED What changed: when to take this   insulin glargine-yfgn 100 UNIT/ML injection Commonly known as: SEMGLEE Inject 0.08 mLs (8 Units total) into the skin at bedtime.   mirtazapine 7.5 MG tablet Commonly known as: REMERON Take 1 tablet (7.5 mg total) by mouth at bedtime. TAKE ONE TABLET BY MOUTH AT BEDTIME FOR DEPRESSION What changed: additional instructions   OneTouch Delica Lancets 76P Misc Test blood sugars three times daily   OneTouch Verio test strip Generic drug: glucose blood 1 each by Other route in the morning, at noon, in the evening, and at bedtime. use for testing   primidone 50 MG tablet Commonly known as: MYSOLINE TAKE ONE TABLET BY MOUTH AT BEDTIME   Semaglutide (2 MG/DOSE) 8 MG/3ML Sopn Inject 2 mg as directed once a  week. What changed: when to take this   topiramate 50 MG tablet Commonly known as: TOPAMAX TAKE ONE TABLET BY MOUTH AT BEDTIME AS NEEDED What changed:  when to take this reasons to take this        No Known Allergies  Consultations: Cardiology.    Procedures/Studies: CT ANGIO HEAD NECK W WO CM  Result Date: 05/27/2021 CLINICAL DATA:  Evaluate vertebral arteries EXAM: CT ANGIOGRAPHY HEAD AND NECK TECHNIQUE: Multidetector CT imaging of the head and neck was performed using the standard protocol during bolus administration of intravenous contrast. Multiplanar CT image reconstructions and MIPs were obtained to evaluate the vascular anatomy. Carotid stenosis measurements (when applicable) are obtained utilizing NASCET criteria, using the distal internal carotid diameter as the denominator. RADIATION DOSE REDUCTION: This exam was performed according to the departmental dose-optimization program which includes automated exposure control, adjustment of the mA and/or kV according to patient size and/or use of iterative reconstruction technique. CONTRAST:  63m OMNIPAQUE IOHEXOL 350 MG/ML SOLN COMPARISON:  06/09/2016 CTA head, correlation is also made with CT head 05/26/2021 FINDINGS: CT HEAD FINDINGS Brain: No  evidence of acute infarction, hemorrhage, cerebral edema, mass, mass effect, or midline shift. No hydrocephalus or extra-axial fluid collection. Unchanged size and configuration of the ventricles. Redemonstrated hypodensity in the left frontal and parietal lobe, unchanged. Vascular: No hyperdense vessel. Skull: Normal. Negative for fracture or focal lesion. Sinuses/Orbits: No acute finding. Status post bilateral lens replacements. Other: The mastoid air cells are well aerated. CTA NECK FINDINGS Aortic arch: Two-vessel arch with a common origin of the brachiocephalic and left common carotid arteries. Imaged portion shows no evidence of aneurysm or dissection. Aortic atherosclerosis, which extends  into the origins of the branch vessels, without hemodynamically significant stenosis. 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 The distal right ICA was noted to be occluded on the 2018 CTA, but no prior imaging of the neck is available. Left carotid system: Mild narrowing of the distal left CCA, secondary to calcified and noncalcified plaque. Calcified and noncalcified plaque at the bifurcation and in the proximal left ICA which causes up to 70% stenosis (series 9, images 197-225). Vertebral arteries: The left vertebral artery is not visualized in the V1 and V2 segments, with distal reconstitution at the level of C3 (series 9, image 214), secondary to collaterals (series 9, image 206). The remainder of the V2 and V3 segments demonstrate mild calcification and multifocal mild narrowing. The right vertebral artery is not visualized in the V1 and majority of the V2 segment, with faint opacification in the distal V2, with reconstitution distally in the mid V3 segment (series 9, image 168) via a collateral vessel (series 6, image 167). Skeleton: Prior median sternotomy. Mild degenerative changes in the cervical spine. No acute osseous abnormality. Other neck: No acute finding. Upper chest: No focal parenchymal opacity. Mild peribronchial thickening. No pleural effusion. Review of the MIP images confirms the above findings CTA HEAD FINDINGS Anterior circulation: Non opacification of the intracranial right ICA in the petrous portion, with minimal opacification in the cavernous and supraclinoid segment, although the vessel remains diminutive, likely secondary to atherosclerotic disease. The intracranial left ICA is patent, with mild narrowing in the distal petrous portion (series 9, image 138 and 137). A1 segments patent. Normal anterior communicating artery. Anterior cerebral arteries are patent to their distal aspects. No  M1 stenosis or occlusion. Normal MCA bifurcations. Distal MCA branches are perfused without focal stenosis. Posterior circulation: Vertebral arteries patent to the vertebrobasilar junction without stenosis. Posterior inferior cerebral arteries patent proximally, however the right PICA demonstrates focal non opacification just distal to its takeoff (series 9, image 146), with distal reconstitution. Basilar patent to its distal aspect. Superior cerebellar arteries patent proximally. Bilateral P1 segments originate from the basilar artery. PCAs perfused but irregular. Diminutive bilateral posterior communicating arteries are visualized. Venous sinuses: As permitted by contrast timing, patent. Anatomic variants: None significant Review of the MIP images confirms the above findings IMPRESSION: 1. 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 The intracranial portion was noted to be occluded on 06/09/2016; although there is no prior imaging of the cervical ICA or CCA, this is likely chronic. 2. Approximately 70% stenosis in the proximal left ICA. 3. 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 4. Occlusion of the right vertebral artery  from its origin to the mid V3 segment, where it is reconstituted via collateral vessel, with minimal faint opacification in the distal V2 and proximal V3, likely retrograde. 5. Focal stenosis in the right PICA, just distal to its takeoff. 6. Mild narrowing in the distal petrous left ICA. These results were called by telephone at the time of interpretation on 05/27/2021 at 10:36 pm to provider Nurse Hulan Fess, for Dr. Orlie Pollen , who verbally acknowledged these results. Electronically Signed   By: Merilyn Baba M.D.   On: 05/27/2021 22:36    DG Chest 2 View  Result Date: 05/24/2021 CLINICAL DATA:  Congestion. EXAM: CHEST - 2 VIEW COMPARISON:  X-ray chest 04/10/2021. FINDINGS: Two calcified right mid lung pulmonary nodules consistent with sequela prior granulomatous disease. No focal consolidation. No pleural effusion or pneumothorax. Heart and mediastinal contours are unremarkable. Single lead cardiac pacemaker. Prior CABG. No acute osseous abnormality. IMPRESSION: No active cardiopulmonary disease. Electronically Signed   By: Kathreen Devoid M.D.   On: 05/24/2021 12:46   DG Ribs Unilateral W/Chest Right  Result Date: 05/26/2021 CLINICAL DATA:  Syncope.  Fall.  Right rib pain. EXAM: RIGHT RIBS AND CHEST - 3+ VIEW COMPARISON:  None. FINDINGS: The lungs are clear without focal pneumonia, edema, pneumothorax or pleural effusion. Calcified granulomata again noted right middle lobe as seen on CT chest 04/12/2012. Sclerotic lesion anterior right third rib also stable since prior CT. No evidence for pneumothorax or pleural effusion. Pacer/AICD noted. The cardiopericardial silhouette is within normal limits for size. Oblique views of the right ribs show no evidence for displaced acute right-sided rib fracture. IMPRESSION: 1. No acute cardiopulmonary findings. 2. No displaced acute right-sided rib fracture. 3. Stable calcified granulomata right middle lobe and sclerotic lesion anterior right third rib. Electronically Signed   By: Misty Stanley M.D.   On: 05/26/2021 12:08   CT Head Wo Contrast  Result Date: 05/26/2021 CLINICAL DATA:  Patient with syncope.  Fall. EXAM: CT HEAD WITHOUT CONTRAST TECHNIQUE: Contiguous axial images were obtained from the base of the skull through the vertex without intravenous contrast. RADIATION DOSE REDUCTION: This exam was performed according to the departmental dose-optimization program which includes automated exposure control, adjustment of the mA and/or kV according to patient size and/or use of iterative  reconstruction technique. COMPARISON:  Brain CT 05/20/2017. FINDINGS: Brain: Ventricles and sulci are prominent compatible with atrophy. Periventricular and subcortical white matter hypodensities compatible with chronic microvascular ischemic changes. No evidence for acute cortically based infarct, intracranial hemorrhage, mass lesion or mass-effect. Vascular: Unremarkable Skull: Intact Sinuses/Orbits: Paranasal sinuses are well aerated. Mastoid air cells are unremarkable. Other: None. IMPRESSION: No acute intracranial process. Atrophy and chronic microvascular ischemic changes. Electronically Signed   By: Lovey Newcomer M.D.   On: 05/26/2021 11:55   ECHOCARDIOGRAM COMPLETE  Result Date: 05/27/2021    ECHOCARDIOGRAM REPORT   Patient Name:   Douglas Carr Date of Exam: 05/27/2021 Medical Rec #:  [id number]           Height:       70.0 in Accession #:    [id number]          Weight:       193.3 lb Date of Birth:  12/28/1965           BSA:          2.058 m Patient Age:    56 years            BP:  158/82 mmHg Patient Gender: M                   HR:           66 bpm. Exam Location:  Inpatient Procedure: 2D Echo, Cardiac Doppler and Color Doppler Indications:    R55 Syncope  History:        Patient has prior history of Echocardiogram examinations, most                 recent 01/23/2020. Cardiomyopathy, CAD and Previous Myocardial                 Infarction, Defibrillator, Signs/Symptoms:Syncope; Risk                 Factors:Hypertension and Dyslipidemia.  Sonographer:    Glo Herring Referring Phys: [id number] [place]  1. Global hypokinesis worse in the inferior base . Left ventricular ejection fraction, by estimation, is 30 to 35%. The left ventricle has moderately decreased function. The left ventricle has no regional wall motion abnormalities. The left ventricular internal cavity size was moderately dilated. Left ventricular diastolic parameters were normal.  2. Right ventricular systolic  function is normal. The right ventricular size is normal.  3. Left atrial size was mildly dilated.  4. The mitral valve is abnormal. No evidence of mitral valve regurgitation. No evidence of mitral stenosis.  5. The aortic valve is tricuspid. There is mild calcification of the aortic valve. Aortic valve regurgitation is not visualized. Aortic valve sclerosis is present, with no evidence of aortic valve stenosis.  6. The inferior vena cava is normal in size with greater than 50% respiratory variability, suggesting right atrial pressure of 3 mmHg. FINDINGS  Left Ventricle: Global hypokinesis worse in the inferior base. Left ventricular ejection fraction, by estimation, is 30 to 35%. The left ventricle has moderately decreased function. The left ventricle has no regional wall motion abnormalities. Definity contrast agent was given IV to delineate the left ventricular endocardial borders. The left ventricular internal cavity size was moderately dilated. There is no left ventricular hypertrophy. Left ventricular diastolic parameters were normal. Right Ventricle: The right ventricular size is normal. No increase in right ventricular wall thickness. Right ventricular systolic function is normal. Left Atrium: Left atrial size was mildly dilated. Right Atrium: Right atrial size was normal in size. Pericardium: There is no evidence of pericardial effusion. Mitral Valve: The mitral valve is abnormal. There is mild thickening of the mitral valve leaflet(s). There is mild calcification of the mitral valve leaflet(s). No evidence of mitral valve regurgitation. No evidence of mitral valve stenosis. Tricuspid Valve: The tricuspid valve is normal in structure. Tricuspid valve regurgitation is not demonstrated. No evidence of tricuspid stenosis. Aortic Valve: The aortic valve is tricuspid. There is mild calcification of the aortic valve. Aortic valve regurgitation is not visualized. Aortic valve sclerosis is present, with no evidence  of aortic valve stenosis. Aortic valve mean gradient measures 2.0 mmHg. Aortic valve peak gradient measures 2.5 mmHg. Aortic valve area, by VTI measures 3.10 cm. Pulmonic Valve: The pulmonic valve was normal in structure. Pulmonic valve regurgitation is not visualized. No evidence of pulmonic stenosis. Aorta: The aortic root is normal in size and structure. Venous: The inferior vena cava is normal in size with greater than 50% respiratory variability, suggesting right atrial pressure of 3 mmHg. IAS/Shunts: No atrial level shunt detected by color flow Doppler.  LEFT VENTRICLE PLAX 2D LVIDd:  5.05 cm      Diastology LVIDs:         4.20 cm      LV e' medial:    4.13 cm/s LV PW:         0.95 cm      LV E/e' medial:  11.6 LV IVS:        0.90 cm      LV e' lateral:   7.22 cm/s LVOT diam:     2.25 cm      LV E/e' lateral: 6.6 LV SV:         45 LV SV Index:   22 LVOT Area:     3.98 cm  LV Volumes (MOD) LV vol d, MOD A2C: 109.0 ml LV vol d, MOD A4C: 137.0 ml LV vol s, MOD A2C: 69.6 ml LV vol s, MOD A4C: 87.3 ml LV SV MOD A2C:     39.4 ml LV SV MOD A4C:     137.0 ml LV SV MOD BP:      41.6 ml RIGHT VENTRICLE            IVC RV Basal diam:  3.40 cm    IVC diam: 1.00 cm RV Mid diam:    2.20 cm RV S prime:     7.62 cm/s LEFT ATRIUM             Index        RIGHT ATRIUM           Index LA diam:        3.65 cm 1.77 cm/m   RA Area:     15.30 cm LA Vol (A2C):   68.8 ml 33.44 ml/m  RA Volume:   36.10 ml  17.55 ml/m LA Vol (A4C):   50.3 ml 24.45 ml/m LA Biplane Vol: 60.0 ml 29.16 ml/m  AORTIC VALVE                    PULMONIC VALVE AV Area (Vmax):    3.64 cm     PV Vmax:       0.82 m/s AV Area (Vmean):   2.42 cm     PV Peak grad:  2.7 mmHg AV Area (VTI):     3.10 cm AV Vmax:           79.30 cm/s AV Vmean:          61.500 cm/s AV VTI:            0.146 m AV Peak Grad:      2.5 mmHg AV Mean Grad:      2.0 mmHg LVOT Vmax:         72.50 cm/s LVOT Vmean:        37.400 cm/s LVOT VTI:          0.114 m LVOT/AV VTI ratio: 0.78   AORTA Ao Root diam: 3.25 cm Ao Asc diam:  3.00 cm MITRAL VALVE MV Area (PHT): 3.99 cm    SHUNTS MV Decel Time: 190 msec    Systemic VTI:  0.11 m MV E velocity: 48.00 cm/s  Systemic Diam: 2.25 cm MV A velocity: 42.40 cm/s MV E/A ratio:  1.13 Jenkins Rouge MD Electronically signed by Jenkins Rouge MD Signature Date/Time: 05/27/2021/9:50:41 AM    Final    VAS US CAROTID  Result Date: 05/28/2021 Carotid Arterial Duplex Study Patient Name:  Douglas Carr [facility]  Date of Exam:   05/27/2021 Medical Rec #: [id number]  Accession #:    [id number] Date of Birth: 1965-05-18            Patient Gender: M Patient Age:   17 years Exam Location:  [facility] Procedure:      VAS US CAROTID Referring Phys: Orma Flaming --------------------------------------------------------------------------------  Indications:       Syncope. Risk Factors:      Hypertension, hyperlipidemia, Diabetes. Comparison Study:  prior 01/26/20 rt occluded ICA lt ICA 40-59% Performing Technologist: Archie Patten RVS  Examination Guidelines: A complete evaluation includes B-mode imaging, spectral Doppler, color Doppler, and power Doppler as needed of all accessible portions of each vessel. Bilateral testing is considered an integral part of a complete examination. Limited examinations for reoccurring indications may be performed as noted.  Right Carotid Findings: +----------+--------+--------+--------+------------------+--------+             PSV cm/s EDV cm/s Stenosis Plaque Description Comments  +----------+--------+--------+--------+------------------+--------+  CCA Prox                     Occluded                              +----------+--------+--------+--------+------------------+--------+  CCA Mid                      Occluded                              +----------+--------+--------+--------+------------------+--------+  CCA Distal                   Occluded                               +----------+--------+--------+--------+------------------+--------+  ICA Prox                     Occluded                              +----------+--------+--------+--------+------------------+--------+  ICA Mid                      Occluded                              +----------+--------+--------+--------+------------------+--------+  ICA Distal                   Occluded                              +----------+--------+--------+--------+------------------+--------+  ECA        117      24                                             +----------+--------+--------+--------+------------------+--------+ +----------+--------+-------+--------+-------------------+             PSV cm/s EDV cms Describe Arm Pressure (mmHG)  +----------+--------+-------+--------+-------------------+  Subclavian 60                                             +----------+--------+-------+--------+-------------------+ +---------+--------+--------+--------------+  Vertebral PSV cm/s EDV cm/s Not identified  +---------+--------+--------+--------------+  Left Carotid Findings: +---------+--------+-------+--------+---------------------------------+--------+            PSV cm/s EDV     Stenosis Plaque Description                Comments                      cm/s                                                         +---------+--------+-------+--------+---------------------------------+--------+  CCA Prox  117      25               heterogenous                                +---------+--------+-------+--------+---------------------------------+--------+  CCA       93       20               heterogenous and calcific                    Distal                                                                          +---------+--------+-------+--------+---------------------------------+--------+  ICA Prox  185      78      60-79%   heterogenous, calcific and                                                       irregular                                    +---------+--------+-------+--------+---------------------------------+--------+  ICA Mid   202      77                                                           +---------+--------+-------+--------+---------------------------------+--------+  ICA       174      58                                                            Distal                                                                          +---------+--------+-------+--------+---------------------------------+--------+  ECA       144      19                                                           +---------+--------+-------+--------+---------------------------------+--------+ +----------+--------+--------+--------+-------------------+             PSV cm/s EDV cm/s Describe Arm Pressure (mmHG)  +----------+--------+--------+--------+-------------------+  Subclavian 212                                             +----------+--------+--------+--------+-------------------+ +---------+--------+--------+--------------+  Vertebral PSV cm/s EDV cm/s Not identified  +---------+--------+--------+--------------+   Summary: Right Carotid: Evidence consistent with a total occlusion of the right ICA. Left Carotid: Velocities in the left ICA are consistent with a 60-79% stenosis. Vertebrals: Bilateral vertebral arteries were not visualized. *See table(s) above for measurements and observations.  Electronically signed by Orlie Pollen on 05/28/2021 at 6:51:43 AM.    Final       Subjective: No new complaints.   Discharge Exam: Vitals:   05/29/21 0640 05/29/21 0835  BP: 121/63 (!) 174/75  Pulse: 73 83  Resp: 18 16  Temp: 97.8 F (36.6 C)   SpO2:     Vitals:   05/28/21 1154 05/28/21 2051 05/29/21 0640 05/29/21 0835  BP: (!) 161/83 (!) 151/82 121/63 (!) 174/75  Pulse: 91 88 73 83  Resp: 18 18 18 16   Temp: 97.6 F (36.4 C) 98.2 F (36.8 C) 97.8 F (36.6 C)   TempSrc: Oral Oral Oral   SpO2:      Weight:      Height:        General: Pt  is alert, awake, not in acute distress Cardiovascular: RRR, S1/S2 +, no rubs, no gallops Respiratory: CTA bilaterally, no wheezing, no rhonchi Abdominal: Soft, NT, ND, bowel sounds + Extremities: no edema, no cyanosis    The results of significant diagnostics from this hospitalization (including imaging, microbiology, ancillary and laboratory) are listed below for reference.     Microbiology: Recent Results (from the past 240 hour(s))  Resp Panel by RT-PCR (Flu A&B, Covid) Nasopharyngeal Swab     Status: None   Collection Time: 05/26/21  2:00 PM   Specimen: Nasopharyngeal Swab; Nasopharyngeal(NP) swabs in vial transport medium  Result Value Ref Range Status   SARS Coronavirus 2 by RT PCR NEGATIVE NEGATIVE Final    Comment: (NOTE) SARS-CoV-2 target nucleic acids are NOT DETECTED.  The SARS-CoV-2 RNA is generally detectable in upper respiratory specimens during the acute phase of infection. The lowest concentration of SARS-CoV-2 viral copies this assay can detect is 138 copies/mL. A negative result does not preclude SARS-Cov-2 infection and should not be used as the sole basis for treatment or other patient management decisions. A negative result may occur with  improper specimen collection/handling, submission of specimen other than nasopharyngeal swab, presence of viral mutation(s) within the areas targeted by this assay, and inadequate number of viral copies(<138 copies/mL). A negative result must be combined with clinical observations, patient history, and epidemiological information. The expected result is Negative.  Fact Sheet for Patients:  EntrepreneurPulse.com.au  Fact Sheet for Healthcare Providers:  IncredibleEmployment.be  This test is no t yet approved  or cleared by the [place] and  has been authorized for detection and/or diagnosis of SARS-CoV-2 by FDA under an Emergency Use Authorization (EUA). This EUA will remain   in effect (meaning this test can be used) for the duration of the COVID-19 declaration under Section 564(b)(1) of the Act, 21 U.S.C.section 360bbb-3(b)(1), unless the authorization is terminated  or revoked sooner.       Influenza A by PCR NEGATIVE NEGATIVE Final   Influenza B by PCR NEGATIVE NEGATIVE Final    Comment: (NOTE) The Xpert Xpress SARS-CoV-2/FLU/RSV plus assay is intended as an aid in the diagnosis of influenza from Nasopharyngeal swab specimens and should not be used as a sole basis for treatment. Nasal washings and aspirates are unacceptable for Xpert Xpress SARS-CoV-2/FLU/RSV testing.  Fact Sheet for Patients: EntrepreneurPulse.com.au  Fact Sheet for Healthcare Providers: IncredibleEmployment.be  This test is not yet approved or cleared by the [place] FDA and has been authorized for detection and/or diagnosis of SARS-CoV-2 by FDA under an Emergency Use Authorization (EUA). This EUA will remain in effect (meaning this test can be used) for the duration of the COVID-19 declaration under Section 564(b)(1) of the Act, 21 U.S.C. section 360bbb-3(b)(1), unless the authorization is terminated or revoked.  Performed at [facility], [address] [place],  [address]      Labs: BNP (last 3 results) No results for input(s): BNP in the last 8760 hours. Basic Metabolic Panel: Recent Labs  Lab 05/26/21 1106 05/26/21 1957 05/27/21 0303 05/28/21 0323  NA 135 141 135 138  K 5.3* 6.3* 4.6 4.2  CL 104 108 109 108  CO2 22 24 22 25   GLUCOSE 323* 225* 224* 147*  BUN 45* 41* 39* 27*  CREATININE 1.54* 1.54* 1.36* 1.30*  CALCIUM 9.0 9.1 8.4* 8.4*   Liver Function Tests: No results for input(s): AST, ALT, ALKPHOS, BILITOT, PROT, ALBUMIN in the last 168 hours. No results for input(s): LIPASE, AMYLASE in the last 168 hours. No results for input(s): AMMONIA in the last 168 hours. CBC: Recent Labs   Lab 05/26/21 1106 05/27/21 0303  WBC 12.5* 16.8*  HGB 14.1 13.5  HCT 43.4 40.7  MCV 84.3 84.3  PLT 214 190   Cardiac Enzymes: No results for input(s): CKTOTAL, CKMB, CKMBINDEX, TROPONINI in the last 168 hours. BNP: Invalid input(s): POCBNP CBG: Recent Labs  Lab 05/28/21 0749 05/28/21 1153 05/28/21 1621 05/28/21 2141 05/29/21 0753  GLUCAP 148* 216* 182* 270* 254*   D-Dimer No results for input(s): DDIMER in the last 72 hours. Hgb A1c Recent Labs    05/26/21 1957  HGBA1C 10.5*   Lipid Profile No results for input(s): CHOL, HDL, LDLCALC, TRIG, CHOLHDL, LDLDIRECT in the last 72 hours. Thyroid function studies No results for input(s): TSH, T4TOTAL, T3FREE, THYROIDAB in the last 72 hours.  Invalid input(s): FREET3 Anemia work up No results for input(s): VITAMINB12, FOLATE, FERRITIN, TIBC, IRON, RETICCTPCT in the last 72 hours. Urinalysis    Component Value Date/Time   COLORURINE YELLOW 05/26/2021 1415   APPEARANCEUR CLEAR 05/26/2021 1415   LABSPEC 1.017 05/26/2021 1415   PHURINE 6.5 05/26/2021 1415   GLUCOSEU >1,000 (A) 05/26/2021 1415   HGBUR NEGATIVE 05/26/2021 1415   BILIRUBINUR NEGATIVE 05/26/2021 1415   KETONESUR NEGATIVE 05/26/2021 1415   PROTEINUR 30 (A) 05/26/2021 1415   UROBILINOGEN 0.2 08/19/2011 2051   NITRITE NEGATIVE 05/26/2021 1415   LEUKOCYTESUR NEGATIVE 05/26/2021 1415   Sepsis Labs Invalid input(s): PROCALCITONIN,  WBC,  LACTICIDVEN Microbiology  Recent Results (from the past 240 hour(s))  Resp Panel by RT-PCR (Flu A&B, Covid) Nasopharyngeal Swab     Status: None   Collection Time: 05/26/21  2:00 PM   Specimen: Nasopharyngeal Swab; Nasopharyngeal(NP) swabs in vial transport medium  Result Value Ref Range Status   SARS Coronavirus 2 by RT PCR NEGATIVE NEGATIVE Final    Comment: (NOTE) SARS-CoV-2 target nucleic acids are NOT DETECTED.  The SARS-CoV-2 RNA is generally detectable in upper respiratory specimens during the acute phase of  infection. The lowest concentration of SARS-CoV-2 viral copies this assay can detect is 138 copies/mL. A negative result does not preclude SARS-Cov-2 infection and should not be used as the sole basis for treatment or other patient management decisions. A negative result may occur with  improper specimen collection/handling, submission of specimen other than nasopharyngeal swab, presence of viral mutation(s) within the areas targeted by this assay, and inadequate number of viral copies(<138 copies/mL). A negative result must be combined with clinical observations, patient history, and epidemiological information. The expected result is Negative.  Fact Sheet for Patients:  EntrepreneurPulse.com.au  Fact Sheet for Healthcare Providers:  IncredibleEmployment.be  This test is no t yet approved or cleared by the [place] FDA and  has been authorized for detection and/or diagnosis of SARS-CoV-2 by FDA under an Emergency Use Authorization (EUA). This EUA will remain  in effect (meaning this test can be used) for the duration of the COVID-19 declaration under Section 564(b)(1) of the Act, 21 U.S.C.section 360bbb-3(b)(1), unless the authorization is terminated  or revoked sooner.       Influenza A by PCR NEGATIVE NEGATIVE Final   Influenza B by PCR NEGATIVE NEGATIVE Final    Comment: (NOTE) The Xpert Xpress SARS-CoV-2/FLU/RSV plus assay is intended as an aid in the diagnosis of influenza from Nasopharyngeal swab specimens and should not be used as a sole basis for treatment. Nasal washings and aspirates are unacceptable for Xpert Xpress SARS-CoV-2/FLU/RSV testing.  Fact Sheet for Patients: EntrepreneurPulse.com.au  Fact Sheet for Healthcare Providers: IncredibleEmployment.be  This test is not yet approved or cleared by the [place] FDA and has been authorized for detection and/or diagnosis of SARS-CoV-2  by FDA under an Emergency Use Authorization (EUA). This EUA will remain in effect (meaning this test can be used) for the duration of the COVID-19 declaration under Section 564(b)(1) of the Act, 21 U.S.C. section 360bbb-3(b)(1), unless the authorization is terminated or revoked.  Performed at [facility], [address] [place], [place] [address]      Time coordinating discharge: 39 minutes.   SIGNED:   Hosie Poisson, MD  [facility] 05/29/2021, 11:26 AM

## 2021-05-29 NOTE — Progress Notes (Signed)
Pt safely discharged. Discharge packet provided with teach-back method. VS wnL and as per flow. IVs removed, Pt verbalized understanding. All questions and concerns addressed. Awaiting on volunteer for transport. Wife present.

## 2021-05-29 NOTE — Progress Notes (Signed)
Progress Note  Patient Name: Douglas Carr Date of Encounter: 05/29/2021  [facility] Cardiologist: Nanetta BattyJonathan Berry, MD   Subjective   No problems overnight.  Inpatient Medications    Scheduled Meds:  aspirin EC  81 mg Oral Daily   atorvastatin  80 mg Oral QPM   carvedilol  3.125 mg Oral Q12H   clopidogrel  75 mg Oral QPM   diclofenac Sodium  2 g Topical QID   DULoxetine  120 mg Oral Daily   enoxaparin (LOVENOX) injection  40 mg Subcutaneous Q24H   insulin aspart  0-9 Units Subcutaneous TID WC   insulin glargine-yfgn  8 Units Subcutaneous QHS   mirtazapine  7.5 mg Oral QHS   primidone  50 mg Oral QHS   sodium chloride flush  3 mL Intravenous Q12H   sodium chloride flush  3 mL Intravenous Q12H   Continuous Infusions:  sodium chloride     PRN Meds: sodium chloride, acetaminophen **OR** acetaminophen, hydrOXYzine, oxyCODONE, sodium chloride flush, topiramate   Vital Signs    Vitals:   05/28/21 1154 05/28/21 2051 05/29/21 0640 05/29/21 0835  BP: (!) 161/83 (!) 151/82 121/63 (!) 174/75  Pulse: 91 88 73 83  Resp: 18 18 18 16   Temp: 97.6 F (36.4 C) 98.2 F (36.8 C) 97.8 F (36.6 C)   TempSrc: Oral Oral Oral   SpO2:      Weight:      Height:        Intake/Output Summary (Last 24 hours) at 05/29/2021 0948 Last data filed at 05/28/2021 2117 Gross per 24 hour  Intake 243 ml  Output --  Net 243 ml   Last 3 Weights 05/27/2021 05/26/2021 05/26/2021  Weight (lbs) 193 lb 5.5 oz 188 lb 0.8 oz 191 lb 1.6 oz  Weight (kg) 87.7 kg 85.3 kg 86.682 kg      Telemetry    Sinus rhythm- Personally Reviewed  ECG    Not repeated- Personally Reviewed  Physical Exam  Orthostatic blood pressure this a.m.: 174/75 mmHg with heart rate 84 (lying); 171/58 mmHg with heart rate 82 (sitting); 137/73 mmHg heart rate 86 (standing GEN: No acute distress.  Lying in bed    Labs    High Sensitivity Troponin:   Recent Labs  Lab 05/26/21 1106 05/26/21 1305 05/27/21 0011   TROPONINIHS 25* 24* 30*     Chemistry Recent Labs  Lab 05/26/21 1957 05/27/21 0303 05/28/21 0323  NA 141 135 138  K 6.3* 4.6 4.2  CL 108 109 108  CO2 24 22 25   GLUCOSE 225* 224* 147*  BUN 41* 39* 27*  CREATININE 1.54* 1.36* 1.30*  CALCIUM 9.1 8.4* 8.4*  GFRNONAA 53* >60 >60  ANIONGAP 9 4* 5    Lipids No results for input(s): CHOL, TRIG, HDL, LABVLDL, LDLCALC, CHOLHDL in the last 168 hours.  Hematology Recent Labs  Lab 05/26/21 1106 05/27/21 0303  WBC 12.5* 16.8*  RBC 5.15 4.83  HGB 14.1 13.5  HCT 43.4 40.7  MCV 84.3 84.3  MCH 27.4 28.0  MCHC 32.5 33.2  RDW 13.1 12.7  PLT 214 190   Thyroid No results for input(s): TSH, FREET4 in the last 168 hours.  BNPNo results for input(s): BNP, PROBNP in the last 168 hours.  DDimer No results for input(s): DDIMER in the last 168 hours.   Radiology    CT ANGIO HEAD NECK W WO CM  Result Date: 05/27/2021 CLINICAL DATA:  Evaluate vertebral arteries EXAM: CT ANGIOGRAPHY HEAD AND NECK  TECHNIQUE: Multidetector CT imaging of the head and neck was performed using the standard protocol during bolus administration of intravenous contrast. Multiplanar CT image reconstructions and MIPs were obtained to evaluate the vascular anatomy. Carotid stenosis measurements (when applicable) are obtained utilizing NASCET criteria, using the distal internal carotid diameter as the denominator. RADIATION DOSE REDUCTION: This exam was performed according to the departmental dose-optimization program which includes automated exposure control, adjustment of the mA and/or kV according to patient size and/or use of iterative reconstruction technique. CONTRAST:  75mL OMNIPAQUE IOHEXOL 350 MG/ML SOLN COMPARISON:  06/09/2016 CTA head, correlation is also made with CT head 05/26/2021 FINDINGS: CT HEAD FINDINGS Brain: No evidence of acute infarction, hemorrhage, cerebral edema, mass, mass effect, or midline shift. No hydrocephalus or extra-axial fluid collection.  Unchanged size and configuration of the ventricles. Redemonstrated hypodensity in the left frontal and parietal lobe, unchanged. Vascular: No hyperdense vessel. Skull: Normal. Negative for fracture or focal lesion. Sinuses/Orbits: No acute finding. Status post bilateral lens replacements. Other: The mastoid air cells are well aerated. CTA NECK FINDINGS Aortic arch: Two-vessel arch with a common origin of the brachiocephalic and left common carotid arteries. Imaged portion shows no evidence of aneurysm or dissection. Aortic atherosclerosis, which extends into the origins of the branch vessels, without hemodynamically significant stenosis. 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 The distal right ICA was noted to be occluded on the 2018 CTA, but no prior imaging of the neck is available. Left carotid system: Mild narrowing of the distal left CCA, secondary to calcified and noncalcified plaque. Calcified and noncalcified plaque at the bifurcation and in the proximal left ICA which causes up to 70% stenosis (series 9, images 197-225). Vertebral arteries: The left vertebral artery is not visualized in the V1 and V2 segments, with distal reconstitution at the level of C3 (series 9, image 214), secondary to collaterals (series 9, image 206). The remainder of the V2 and V3 segments demonstrate mild calcification and multifocal mild narrowing. The right vertebral artery is not visualized in the V1 and majority of the V2 segment, with faint opacification in the distal V2, with reconstitution distally in the mid V3 segment (series 9, image 168) via a collateral vessel (series 6, image 167). Skeleton: Prior median sternotomy. Mild degenerative changes in the cervical spine. No acute osseous abnormality. Other neck: No acute finding. Upper chest: No focal parenchymal opacity. Mild peribronchial thickening. No pleural effusion.  Review of the MIP images confirms the above findings CTA HEAD FINDINGS Anterior circulation: Non opacification of the intracranial right ICA in the petrous portion, with minimal opacification in the cavernous and supraclinoid segment, although the vessel remains diminutive, likely secondary to atherosclerotic disease. The intracranial left ICA is patent, with mild narrowing in the distal petrous portion (series 9, image 138 and 137). A1 segments patent. Normal anterior communicating artery. Anterior cerebral arteries are patent to their distal aspects. No M1 stenosis or occlusion. Normal MCA bifurcations. Distal MCA branches are perfused without focal stenosis. Posterior circulation: Vertebral arteries patent to the vertebrobasilar junction without stenosis. Posterior inferior cerebral arteries patent proximally, however the right PICA demonstrates focal non opacification just distal to its takeoff (series 9, image 146), with distal reconstitution. Basilar patent to its distal aspect. Superior cerebellar arteries patent proximally. Bilateral P1 segments originate from the basilar artery. PCAs perfused but irregular. Diminutive bilateral posterior communicating arteries are visualized. Venous sinuses: As permitted by contrast timing, patent.  Anatomic variants: None significant Review of the MIP images confirms the above findings IMPRESSION: 1. 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 The intracranial portion was noted to be occluded on 06/09/2016; although there is no prior imaging of the cervical ICA or CCA, this is likely chronic. 2. Approximately 70% stenosis in the proximal left ICA. 3. 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 4. Occlusion of the right vertebral  artery from its origin to the mid V3 segment, where it is reconstituted via collateral vessel, with minimal faint opacification in the distal V2 and proximal V3, likely retrograde. 5. Focal stenosis in the right PICA, just distal to its takeoff. 6. Mild narrowing in the distal petrous left ICA. These results were called by telephone at the time of interpretation on 05/27/2021 at 10:36 pm to provider Nurse Dorian HeckleKathy Robinson, for Dr. Gerarda FractionJOSHUA ROBINS , who verbally acknowledged these results. Electronically Signed   By: Wiliam KeAlison  Vasan M.D.   On: 05/27/2021 22:36   VAS US CAROTID  Result Date: 05/28/2021 Carotid Arterial Duplex Study Patient Name:  Douglas Carr  Date of Exam:   05/27/2021 Medical Rec #: [id number]            Accession #:    [id number] Date of Birth: [birth year]            Patient Gender: M Patient Age:   [age] years [facility] Procedure:      VAS US CAROTID Referring Phys: Orland MustardALLISON WOLFE --------------------------------------------------------------------------------  Indications:       Syncope. Risk Factors:      Hypertension, hyperlipidemia, Diabetes. Comparison Study:  prior 01/26/20 rt occluded ICA lt ICA 40-59% Performing Technologist: [place] PonderMegan Stricklin RVS  Examination Guidelines: A complete evaluation includes B-mode imaging, spectral Doppler, color Doppler, and power Doppler as needed of all accessible portions of each vessel. Bilateral testing is considered an integral part of a complete examination. Limited examinations for reoccurring indications may be performed as noted.  Right Carotid Findings: +----------+--------+--------+--------+------------------+--------+             PSV cm/s EDV cm/s Stenosis Plaque Description Comments  +----------+--------+--------+--------+------------------+--------+  CCA Prox                     Occluded                              +----------+--------+--------+--------+------------------+--------+  CCA Mid                      Occluded                               +----------+--------+--------+--------+------------------+--------+  CCA Distal                   Occluded                              +----------+--------+--------+--------+------------------+--------+  ICA Prox                     Occluded                              +----------+--------+--------+--------+------------------+--------+  ICA Mid                      Occluded                              +----------+--------+--------+--------+------------------+--------+  ICA Distal                   Occluded                              +----------+--------+--------+--------+------------------+--------+  ECA        117      24                                             +----------+--------+--------+--------+------------------+--------+ +----------+--------+-------+--------+-------------------+             PSV cm/s EDV cms Describe Arm Pressure (mmHG)  +----------+--------+-------+--------+-------------------+  Subclavian 60                                             +----------+--------+-------+--------+-------------------+ +---------+--------+--------+--------------+  Vertebral PSV cm/s EDV cm/s Not identified  +---------+--------+--------+--------------+  Left Carotid Findings: +---------+--------+-------+--------+---------------------------------+--------+            PSV cm/s EDV     Stenosis Plaque Description                Comments                      cm/s                                                         +---------+--------+-------+--------+---------------------------------+--------+  CCA Prox  117      25               heterogenous                                +---------+--------+-------+--------+---------------------------------+--------+  CCA       93       20               heterogenous and calcific                    Distal                                                                           +---------+--------+-------+--------+---------------------------------+--------+  ICA Prox  185      78      60-79%   heterogenous, calcific and  irregular                                   +---------+--------+-------+--------+---------------------------------+--------+  ICA Mid   202      77                                                           +---------+--------+-------+--------+---------------------------------+--------+  ICA       174      58                                                            Distal                                                                          +---------+--------+-------+--------+---------------------------------+--------+  ECA       144      19                                                           +---------+--------+-------+--------+---------------------------------+--------+ +----------+--------+--------+--------+-------------------+             PSV cm/s EDV cm/s Describe Arm Pressure (mmHG)  +----------+--------+--------+--------+-------------------+  Subclavian 212                                             +----------+--------+--------+--------+-------------------+ +---------+--------+--------+--------------+  Vertebral PSV cm/s EDV cm/s Not identified  +---------+--------+--------+--------------+   Summary: Right Carotid: Evidence consistent with a total occlusion of the right ICA. Left Carotid: Velocities in the left ICA are consistent with a 60-79% stenosis. Vertebrals: Bilateral vertebral arteries were not visualized. *See table(s) above for measurements and observations.  Electronically signed by Gerarda Fraction on 05/28/2021 at 6:51:43 AM.    Final     Cardiac Studies   No new data  Patient Profile     56 y.o. 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  Assessment & Plan    Orthostatic hypotension: Improved but still present with > 30 mmHg drop in systolic pressure from lying to standing.  Diastolic pressure is more stable.  It would be okay to discharge later this morning if tolerates remaining blood pressure medications without severe orthostasis. [facility] will sign off.   Medication Recommendations:  Remain off tamsulosin and long-acting nitrates. Other recommendations (labs, testing, etc): Support stockings as much as possible Follow up as an outpatient: Follow-up with primary physician Dr. Nanetta Batty  For questions or updates, please contact [facility] Please consult [email or web address] for contact info under        Signed, Lesleigh Noe, MD  05/29/2021, 9:48 AM

## 2021-05-29 NOTE — Progress Notes (Signed)
Progress Note  Patient Name: Douglas Carr Date of Encounter: 05/29/2021  [facility] Cardiologist: Nanetta BattyJonathan Berry, MD   Subjective   No complaints this morning.   Inpatient Medications    Scheduled Meds:  aspirin EC  81 mg Oral Daily   atorvastatin  80 mg Oral QPM   carvedilol  3.125 mg Oral Q12H   clopidogrel  75 mg Oral QPM   diclofenac Sodium  2 g Topical QID   DULoxetine  120 mg Oral Daily   enoxaparin (LOVENOX) injection  40 mg Subcutaneous Q24H   insulin aspart  0-9 Units Subcutaneous TID WC   insulin glargine-yfgn  8 Units Subcutaneous QHS   mirtazapine  7.5 mg Oral QHS   primidone  50 mg Oral QHS   sodium chloride flush  3 mL Intravenous Q12H   sodium chloride flush  3 mL Intravenous Q12H   Continuous Infusions:  sodium chloride     PRN Meds: sodium chloride, acetaminophen **OR** acetaminophen, hydrOXYzine, oxyCODONE, sodium chloride flush, topiramate   Vital Signs    Vitals:   05/28/21 1154 05/28/21 2051 05/29/21 0640 05/29/21 0835  BP: (!) 161/83 (!) 151/82 121/63 (!) 174/75  Pulse: 91 88 73 83  Resp: 18 18 18 16   Temp: 97.6 F (36.4 C) 98.2 F (36.8 C) 97.8 F (36.6 C)   TempSrc: Oral Oral Oral   SpO2:      Weight:      Height:        Intake/Output Summary (Last 24 hours) at 05/29/2021 0916 Last data filed at 05/28/2021 2117 Gross per 24 hour  Intake 243 ml  Output --  Net 243 ml   Last 3 Weights 05/27/2021 05/26/2021 05/26/2021  Weight (lbs) 193 lb 5.5 oz 188 lb 0.8 oz 191 lb 1.6 oz  Weight (kg) 87.7 kg 85.3 kg 86.682 kg      Telemetry    SR - Personally Reviewed  ECG    No new tracing   Physical Exam   GEN: No acute distress.   Neck: No JVD Cardiac: RRR, no murmurs, rubs, or gallops.  Respiratory: Clear to auscultation bilaterally. GI: Soft, nontender, non-distended  MS: No edema; No deformity. Neuro:  Nonfocal  Psych: Normal affect   Labs    High Sensitivity Troponin:   Recent Labs  Lab 05/26/21 1106  05/26/21 1305 05/27/21 0011  TROPONINIHS 25* 24* 30*     Chemistry Recent Labs  Lab 05/26/21 1957 05/27/21 0303 05/28/21 0323  NA 141 135 138  K 6.3* 4.6 4.2  CL 108 109 108  CO2 24 22 25   GLUCOSE 225* 224* 147*  BUN 41* 39* 27*  CREATININE 1.54* 1.36* 1.30*  CALCIUM 9.1 8.4* 8.4*  GFRNONAA 53* >60 >60  ANIONGAP 9 4* 5    Lipids No results for input(s): CHOL, TRIG, HDL, LABVLDL, LDLCALC, CHOLHDL in the last 168 hours.  Hematology Recent Labs  Lab 05/26/21 1106 05/27/21 0303  WBC 12.5* 16.8*  RBC 5.15 4.83  HGB 14.1 13.5  HCT 43.4 40.7  MCV 84.3 84.3  MCH 27.4 28.0  MCHC 32.5 33.2  RDW 13.1 12.7  PLT 214 190   Thyroid No results for input(s): TSH, FREET4 in the last 168 hours.  BNPNo results for input(s): BNP, PROBNP in the last 168 hours.  DDimer No results for input(s): DDIMER in the last 168 hours.   Radiology    CT ANGIO HEAD NECK W WO CM  Result Date: 05/27/2021 CLINICAL DATA:  Evaluate  vertebral arteries EXAM: CT ANGIOGRAPHY HEAD AND NECK TECHNIQUE: Multidetector CT imaging of the head and neck was performed using the standard protocol during bolus administration of intravenous contrast. Multiplanar CT image reconstructions and MIPs were obtained to evaluate the vascular anatomy. Carotid stenosis measurements (when applicable) are obtained utilizing NASCET criteria, using the distal internal carotid diameter as the denominator. RADIATION DOSE REDUCTION: This exam was performed according to the departmental dose-optimization program which includes automated exposure control, adjustment of the mA and/or kV according to patient size and/or use of iterative reconstruction technique. CONTRAST:  75mL OMNIPAQUE IOHEXOL 350 MG/ML SOLN COMPARISON:  06/09/2016 CTA head, correlation is also made with CT head 05/26/2021 FINDINGS: CT HEAD FINDINGS Brain: No evidence of acute infarction, hemorrhage, cerebral edema, mass, mass effect, or midline shift. No hydrocephalus or  extra-axial fluid collection. Unchanged size and configuration of the ventricles. Redemonstrated hypodensity in the left frontal and parietal lobe, unchanged. Vascular: No hyperdense vessel. Skull: Normal. Negative for fracture or focal lesion. Sinuses/Orbits: No acute finding. Status post bilateral lens replacements. Other: The mastoid air cells are well aerated. CTA NECK FINDINGS Aortic arch: Two-vessel arch with a common origin of the brachiocephalic and left common carotid arteries. Imaged portion shows no evidence of aneurysm or dissection. Aortic atherosclerosis, which extends into the origins of the branch vessels, without hemodynamically significant stenosis. 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 The distal right ICA was noted to be occluded on the 2018 CTA, but no prior imaging of the neck is available. Left carotid system: Mild narrowing of the distal left CCA, secondary to calcified and noncalcified plaque. Calcified and noncalcified plaque at the bifurcation and in the proximal left ICA which causes up to 70% stenosis (series 9, images 197-225). Vertebral arteries: The left vertebral artery is not visualized in the V1 and V2 segments, with distal reconstitution at the level of C3 (series 9, image 214), secondary to collaterals (series 9, image 206). The remainder of the V2 and V3 segments demonstrate mild calcification and multifocal mild narrowing. The right vertebral artery is not visualized in the V1 and majority of the V2 segment, with faint opacification in the distal V2, with reconstitution distally in the mid V3 segment (series 9, image 168) via a collateral vessel (series 6, image 167). Skeleton: Prior median sternotomy. Mild degenerative changes in the cervical spine. No acute osseous abnormality. Other neck: No acute finding. Upper chest: No focal parenchymal opacity. Mild peribronchial  thickening. No pleural effusion. Review of the MIP images confirms the above findings CTA HEAD FINDINGS Anterior circulation: Non opacification of the intracranial right ICA in the petrous portion, with minimal opacification in the cavernous and supraclinoid segment, although the vessel remains diminutive, likely secondary to atherosclerotic disease. The intracranial left ICA is patent, with mild narrowing in the distal petrous portion (series 9, image 138 and 137). A1 segments patent. Normal anterior communicating artery. Anterior cerebral arteries are patent to their distal aspects. No M1 stenosis or occlusion. Normal MCA bifurcations. Distal MCA branches are perfused without focal stenosis. Posterior circulation: Vertebral arteries patent to the vertebrobasilar junction without stenosis. Posterior inferior cerebral arteries patent proximally, however the right PICA demonstrates focal non opacification just distal to its takeoff (series 9, image 146), with distal reconstitution. Basilar patent to its distal aspect. Superior cerebellar arteries patent proximally. Bilateral P1 segments originate from the basilar artery. PCAs perfused but irregular. Diminutive bilateral posterior communicating arteries are visualized.  Venous sinuses: As permitted by contrast timing, patent. Anatomic variants: None significant Review of the MIP images confirms the above findings IMPRESSION: 1. 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 The intracranial portion was noted to be occluded on 06/09/2016; although there is no prior imaging of the cervical ICA or CCA, this is likely chronic. 2. Approximately 70% stenosis in the proximal left ICA. 3. 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 4.  Occlusion of the right vertebral artery from its origin to the mid V3 segment, where it is reconstituted via collateral vessel, with minimal faint opacification in the distal V2 and proximal V3, likely retrograde. 5. Focal stenosis in the right PICA, just distal to its takeoff. 6. Mild narrowing in the distal petrous left ICA. These results were called by telephone at the time of interpretation on 05/27/2021 at 10:36 pm to provider Nurse Dorian Heckle, for Dr. Gerarda Fraction , who verbally acknowledged these results. Electronically Signed   By: Wiliam Ke M.D.   On: 05/27/2021 22:36   VAS US CAROTID  Result Date: 05/28/2021 Carotid Arterial Duplex Study Patient Name:  Douglas Carr [facility]  Date of Exam:   05/27/2021 Medical Rec #: [id number]            Accession #:    [id number] Date of Birth: [birth year]            Patient Gender: M Patient Age:   56 years Exam Location:  [facility] Procedure:      VAS US CAROTID Referring Phys: Orland Mustard --------------------------------------------------------------------------------  Indications:       Syncope. Risk Factors:      Hypertension, hyperlipidemia, Diabetes. Comparison Study:  prior 01/26/20 rt occluded ICA lt ICA 40-59% Performing Technologist: [place] Ponder RVS  Examination Guidelines: A complete evaluation includes B-mode imaging, spectral Doppler, color Doppler, and power Doppler as needed of all accessible portions of each vessel. Bilateral testing is considered an integral part of a complete examination. Limited examinations for reoccurring indications may be performed as noted.  Right Carotid Findings: +----------+--------+--------+--------+------------------+--------+             PSV cm/s EDV cm/s Stenosis Plaque Description Comments  +----------+--------+--------+--------+------------------+--------+  CCA Prox                     Occluded                              +----------+--------+--------+--------+------------------+--------+  CCA Mid                       Occluded                              +----------+--------+--------+--------+------------------+--------+  CCA Distal                   Occluded                              +----------+--------+--------+--------+------------------+--------+  ICA Prox  Occluded                              +----------+--------+--------+--------+------------------+--------+  ICA Mid                      Occluded                              +----------+--------+--------+--------+------------------+--------+  ICA Distal                   Occluded                              +----------+--------+--------+--------+------------------+--------+  ECA        117      24                                             +----------+--------+--------+--------+------------------+--------+ +----------+--------+-------+--------+-------------------+             PSV cm/s EDV cms Describe Arm Pressure (mmHG)  +----------+--------+-------+--------+-------------------+  Subclavian 60                                             +----------+--------+-------+--------+-------------------+ +---------+--------+--------+--------------+  Vertebral PSV cm/s EDV cm/s Not identified  +---------+--------+--------+--------------+  Left Carotid Findings: +---------+--------+-------+--------+---------------------------------+--------+            PSV cm/s EDV     Stenosis Plaque Description                Comments                      cm/s                                                         +---------+--------+-------+--------+---------------------------------+--------+  CCA Prox  117      25               heterogenous                                +---------+--------+-------+--------+---------------------------------+--------+  CCA       93       20               heterogenous and calcific                    Distal                                                                           +---------+--------+-------+--------+---------------------------------+--------+  ICA Prox  185      78  60-79%   heterogenous, calcific and                                                       irregular                                   +---------+--------+-------+--------+---------------------------------+--------+  ICA Mid   202      77                                                           +---------+--------+-------+--------+---------------------------------+--------+  ICA       174      58                                                            Distal                                                                          +---------+--------+-------+--------+---------------------------------+--------+  ECA       144      19                                                           +---------+--------+-------+--------+---------------------------------+--------+ +----------+--------+--------+--------+-------------------+             PSV cm/s EDV cm/s Describe Arm Pressure (mmHG)  +----------+--------+--------+--------+-------------------+  Subclavian 212                                             +----------+--------+--------+--------+-------------------+ +---------+--------+--------+--------------+  Vertebral PSV cm/s EDV cm/s Not identified  +---------+--------+--------+--------------+   Summary: Right Carotid: Evidence consistent with a total occlusion of the right ICA. Left Carotid: Velocities in the left ICA are consistent with a 60-79% stenosis. Vertebrals: Bilateral vertebral arteries were not visualized. *See table(s) above for measurements and observations.  Electronically signed by Gerarda FractionJoshua Robins on 05/28/2021 at 6:51:43 AM.    Final     Cardiac Studies   Echo: 05/27/21  IMPRESSIONS     1. Global hypokinesis worse in the inferior base . Left ventricular  ejection fraction, by estimation, is 30 to 35%. The left ventricle has  moderately decreased function. The left ventricle has no  regional wall  motion abnormalities. The left ventricular  internal cavity size was moderately dilated. Left ventricular diastolic  parameters were  normal.   2. Right ventricular systolic function is normal. The right ventricular  size is normal.   3. Left atrial size was mildly dilated.   4. The mitral valve is abnormal. No evidence of mitral valve  regurgitation. No evidence of mitral stenosis.   5. The aortic valve is tricuspid. There is mild calcification of the  aortic valve. Aortic valve regurgitation is not visualized. Aortic valve  sclerosis is present, with no evidence of aortic valve stenosis.   6. The inferior vena cava is normal in size with greater than 50%  respiratory variability, suggesting right atrial pressure of 3 mmHg.   FINDINGS   Left Ventricle: Global hypokinesis worse in the inferior base. Left  ventricular ejection fraction, by estimation, is 30 to 35%. The left  ventricle has moderately decreased function. The left ventricle has no  regional wall motion abnormalities. Definity  contrast agent was given IV to delineate the left ventricular endocardial  borders. The left ventricular internal cavity size was moderately dilated.  There is no left ventricular hypertrophy. Left ventricular diastolic  parameters were normal.   Right Ventricle: The right ventricular size is normal. No increase in  right ventricular wall thickness. Right ventricular systolic function is  normal.   Left Atrium: Left atrial size was mildly dilated.   Right Atrium: Right atrial size was normal in size.   Pericardium: There is no evidence of pericardial effusion.   Mitral Valve: The mitral valve is abnormal. There is mild thickening of  the mitral valve leaflet(s). There is mild calcification of the mitral  valve leaflet(s). No evidence of mitral valve regurgitation. No evidence  of mitral valve stenosis.   Tricuspid Valve: The tricuspid valve is normal in structure. Tricuspid   valve regurgitation is not demonstrated. No evidence of tricuspid  stenosis.   Aortic Valve: The aortic valve is tricuspid. There is mild calcification  of the aortic valve. Aortic valve regurgitation is not visualized. Aortic  valve sclerosis is present, with no evidence of aortic valve stenosis.  Aortic valve mean gradient measures  2.0 mmHg. Aortic valve peak gradient measures 2.5 mmHg. Aortic valve area,  by VTI measures 3.10 cm.   Pulmonic Valve: The pulmonic valve was normal in structure. Pulmonic valve  regurgitation is not visualized. No evidence of pulmonic stenosis.   Aorta: The aortic root is normal in size and structure.   Venous: The inferior vena cava is normal in size with greater than 50%  respiratory variability, suggesting right atrial pressure of 3 mmHg.   IAS/Shunts: No atrial level shunt detected by color flow Doppler.   Patient Profile     56 y.o. 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  Assessment & Plan    Syncope: device interrogation with no concerning arrhthymias as etiology of syncope. Recommendations were to stop Imdur, Flomax and PRN lasix, as well as added compression stockings. Some elevated BPs but suspect will need to allow BPs to be on higher side to prevent episodes of orthostasis.   -- will order compression stockings  HFrEF/ICM s/p ICD: no change in LVEF 30-35%. Medications now limited by orthostatic hypotension -- tolerating coreg 3.125mg  BID -- Entresto held, ideally add back at low  dose, likely outpatient   CAD s/p CABG: no chest pain -- continue ASA, plavix, statin, BB   DM: per primary   For questions or updates, please contact [facility] Please consult [email or web address] for  contact info under        Signed, Laverda Page, NP  05/29/2021, 9:16 AM

## 2021-05-30 ENCOUNTER — Telehealth: Payer: Self-pay

## 2021-05-30 NOTE — Telephone Encounter (Signed)
Transition Care Management Follow-up Telephone Call Date of discharge and from where: 05/29/21 - [facility] - syncope How have you been since you were released from the hospital? Doing great today Any questions or concerns? Yes - he feels fine, but his wife is concerned about his personality changing and feels he needs neurology referral  Items Reviewed: Did the pt receive and understand the discharge instructions provided? Yes  Medications obtained and verified? Yes  Other? No  Any new allergies since your discharge? No  Dietary orders reviewed? Yes Do you have support at home? Yes   Home Care and Equipment/Supplies: Were home health services ordered? no If so, what is the name of the agency? N/a  Has the agency set up a time to come to the patient's home? not applicable Were any new equipment or medical supplies ordered?  No What is the name of the medical supply agency? N/a Were you able to get the supplies/equipment? not applicable Do you have any questions related to the use of the equipment or supplies? No  Functional Questionnaire: (I = Independent and D = Dependent) ADLs: I  Bathing/Dressing- I  Meal Prep- I  Eating- I  Maintaining continence- I  Transferring/Ambulation- I  Managing Meds- I  Follow up appointments reviewed:  PCP Hospital f/u appt confirmed? Yes  Scheduled to see [name] on 06/12/21 @ 8:10. [facility] f/u appt confirmed? Yes  Scheduled to see cardiology on 06/04/21 @ 12. Are transportation arrangements needed? No  If their condition worsens, is the pt aware to call PCP or go to the Emergency Dept.? Yes Was the patient provided with contact information for the PCP's office or ED? Yes Was to pt encouraged to call back with questions or concerns? Yes

## 2021-06-03 ENCOUNTER — Ambulatory Visit: Payer: Medicare Other | Admitting: Family Medicine

## 2021-06-04 ENCOUNTER — Encounter: Payer: Medicare PPO | Admitting: Cardiology

## 2021-06-12 ENCOUNTER — Ambulatory Visit: Payer: Medicare PPO | Admitting: Family Medicine

## 2021-06-12 ENCOUNTER — Encounter: Payer: Self-pay | Admitting: Family Medicine

## 2021-06-12 VITALS — BP 184/83 | HR 84 | Ht 70.0 in | Wt 194.0 lb

## 2021-06-12 DIAGNOSIS — G931 Anoxic brain damage, not elsewhere classified: Secondary | ICD-10-CM | POA: Diagnosis not present

## 2021-06-12 DIAGNOSIS — F419 Anxiety disorder, unspecified: Secondary | ICD-10-CM

## 2021-06-12 DIAGNOSIS — I5042 Chronic combined systolic (congestive) and diastolic (congestive) heart failure: Secondary | ICD-10-CM | POA: Diagnosis not present

## 2021-06-12 DIAGNOSIS — R55 Syncope and collapse: Secondary | ICD-10-CM | POA: Diagnosis not present

## 2021-06-12 DIAGNOSIS — F32A Depression, unspecified: Secondary | ICD-10-CM

## 2021-06-12 MED ORDER — ARIPIPRAZOLE 5 MG PO TABS: 5.0000 mg | ORAL_TABLET | Freq: Every day | ORAL | 1 refills | Status: DC

## 2021-06-12 MED ORDER — ISOSORBIDE DINITRATE 5 MG PO TABS: 5.0000 mg | ORAL_TABLET | Freq: Two times a day (BID) | ORAL | 1 refills | Status: DC

## 2021-06-12 NOTE — Progress Notes (Signed)
BP (!) 184/83    Pulse 84    Ht 5' 10"  (1.778 m)    Wt 194 lb (88 kg)    SpO2 100%    BMI 27.84 kg/m    Subjective:   Patient ID: Douglas Carr, male    DOB: 10/04/1965, 56 y.o.   MRN: [id number]  HPI: Douglas Carr is a 56 y.o. male presenting on 06/12/2021 for TCM/Syncope/Hypotension and Neurology referral (Personality changes. Aggressive)   HPI Transition Care Management Follow-up Telephone Call Date of discharge and from where: 05/29/21 - [facility] - syncope How have you been since you were released from the hospital? Doing great today Any questions or concerns? Yes - he feels fine, but his wife is concerned about his personality changing and feels he needs neurology referral Patient was contacted on 05/30/2021 by [facility] LPN   Transition of care office visit Patient was admitted to the hospital on 05/26/2021 and discharged on 05/29/2021.  Patient was admitted to the hospital for syncope and collapse that was suggested to be possibly from CHF or his congestive heart failure.  Cardiology was consulted and back some of his blood pressure medicines.  Patient had a CTA which showed chronic right carotid stenosis and left stenosis of about 70%.  Echocardiogram was done which showed global hypokinesis and an ejection fraction of 30 to 35%.  Since leaving the hospital patient says that he is syncope is gone and he no longer has that but his blood pressure has been running up very high  Patient's wife is complaining that he has been having some abnormal personality changes and been more depressed and its been more significant than it was before.  He is also been more irritable.  She is concerned with his previous stroke that there may be something underlying with his personality.  Relevant past medical, surgical, family and social history reviewed and updated as indicated. Interim medical history since our last visit reviewed. Allergies and medications reviewed and  updated.  Review of Systems  Constitutional:  Negative for chills and fever.  Eyes:  Negative for visual disturbance.  Respiratory:  Negative for shortness of breath and wheezing.   Cardiovascular:  Negative for chest pain and leg swelling.  Musculoskeletal:  Negative for back pain and gait problem.  Skin:  Negative for rash.  Neurological:  Negative for dizziness, weakness and light-headedness.  Psychiatric/Behavioral:  Negative for decreased concentration and dysphoric mood. The patient is not nervous/anxious.   All other systems reviewed and are negative.  Per HPI unless specifically indicated above   Allergies as of 06/12/2021   No Known Allergies      Medication List        Accurate as of June 12, 2021  9:00 AM. If you have any questions, ask your nurse or doctor.          albuterol 108 (90 Base) MCG/ACT inhaler Commonly known as: VENTOLIN HFA Inhale 2 puffs into the lungs every 6 (six) hours as needed for wheezing or shortness of breath.   ARIPiprazole 5 MG tablet Commonly known as: Abilify Take 1 tablet (5 mg total) by mouth daily. Started by: Worthy Rancher, MD   Aspirin Low Dose 81 MG EC tablet Generic drug: aspirin TAKE ONE TABLET BY MOUTH DAILY What changed:  how much to take how to take this   atorvastatin 80 MG tablet Commonly known as: LIPITOR Take 1 tablet (80 mg total) by mouth every evening.   B-D  ULTRAFINE III SHORT PEN 31G X 8 MM Misc Generic drug: Insulin Pen Needle USE TO INJECT INSULIN DAILY DX E11.9   blood glucose meter kit and supplies Kit Inject 1 each into the skin 4 (four) times daily as needed. Dispense based on patient and insurance preference. Use up to four times daily as directed. (FOR ICD-9 250.00, 250.01).   carvedilol 3.125 MG tablet Commonly known as: COREG Take 1 tablet (3.125 mg total) by mouth every 12 (twelve) hours.   clopidogrel 75 MG tablet Commonly known as: PLAVIX TAKE ONE TABLET BY MOUTH IN THE  EVENING What changed:  how much to take how to take this when to take this additional instructions   DULoxetine 60 MG capsule Commonly known as: CYMBALTA TAKE TWO CAPSULES BY MOUTH DAILY   hydrOXYzine 25 MG tablet Commonly known as: ATARAX TAKE ONE TABLET BY MOUTH THREE TIMES DAILY AS NEEDED What changed: when to take this   insulin glargine-yfgn 100 UNIT/ML injection Commonly known as: SEMGLEE Inject 0.08 mLs (8 Units total) into the skin at bedtime.   isosorbide dinitrate 5 MG tablet Commonly known as: ISORDIL Take 1 tablet (5 mg total) by mouth 2 (two) times daily. Started by: Fransisca Kaufmann Decklyn Hyder, MD   mirtazapine 7.5 MG tablet Commonly known as: REMERON Take 1 tablet (7.5 mg total) by mouth at bedtime. TAKE ONE TABLET BY MOUTH AT BEDTIME FOR DEPRESSION What changed: additional instructions   OneTouch Delica Lancets 79T Misc Test blood sugars three times daily   OneTouch Verio test strip Generic drug: glucose blood 1 each by Other route in the morning, at noon, in the evening, and at bedtime. use for testing   primidone 50 MG tablet Commonly known as: MYSOLINE TAKE ONE TABLET BY MOUTH AT BEDTIME   Semaglutide (2 MG/DOSE) 8 MG/3ML Sopn Inject 2 mg as directed once a week.   Ozempic (1 MG/DOSE) 4 MG/3ML Sopn Generic drug: Semaglutide (1 MG/DOSE) Inject into the skin.   topiramate 50 MG tablet Commonly known as: TOPAMAX TAKE ONE TABLET BY MOUTH AT BEDTIME AS NEEDED What changed:  when to take this reasons to take this         Objective:   BP (!) 184/83    Pulse 84    Ht 5' 10"  (1.778 m)    Wt 194 lb (88 kg)    SpO2 100%    BMI 27.84 kg/m   Wt Readings from Last 3 Encounters:  06/12/21 194 lb (88 kg)  05/27/21 193 lb 5.5 oz (87.7 kg)  05/24/21 194 lb (88 kg)    Physical Exam Vitals and nursing note reviewed.  Constitutional:      General: He is not in acute distress.    Appearance: He is well-developed. He is not diaphoretic.  Eyes:      General: No scleral icterus.    Conjunctiva/sclera: Conjunctivae normal.  Neck:     Thyroid: No thyromegaly.  Cardiovascular:     Rate and Rhythm: Normal rate and regular rhythm.     Heart sounds: Normal heart sounds. No murmur heard. Pulmonary:     Effort: Pulmonary effort is normal. No respiratory distress.     Breath sounds: Normal breath sounds. No wheezing.  Musculoskeletal:        General: No swelling.     Cervical back: Neck supple.  Lymphadenopathy:     Cervical: No cervical adenopathy.  Skin:    General: Skin is warm and dry.     Findings: No rash.  Neurological:     Mental Status: He is alert and oriented to person, place, and time.     Coordination: Coordination normal.  Psychiatric:        Behavior: Behavior normal.      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  He needs to schedule an appointment with them. Blood pressure very elevated, will add a low-dose of isosorbide, he was on 10 mg before and will do 5 mg twice a day at first and see how it does.  We will add a low-dose of Abilify to help with mood anxiety and depression.  He denies any suicidal ideations.  Referral to neurology Follow up plan: Return if symptoms worsen or fail to improve, for 1 to 2 months CHF and anxiety and  depression.  Counseling provided for all of the vaccine components Orders Placed This Encounter  Procedures   CBC with Differential/Platelet   CMP14+EGFR   Ambulatory referral to Neurology    Caryl Pina, MD [facility] 06/12/2021, 9:00 AM

## 2021-06-14 ENCOUNTER — Other Ambulatory Visit: Payer: Self-pay | Admitting: Family Medicine

## 2021-06-21 ENCOUNTER — Encounter: Payer: Self-pay | Admitting: Physician Assistant

## 2021-06-24 ENCOUNTER — Ambulatory Visit (INDEPENDENT_AMBULATORY_CARE_PROVIDER_SITE_OTHER): Payer: Medicare PPO

## 2021-06-24 ENCOUNTER — Ambulatory Visit: Payer: Medicare PPO | Admitting: Physician Assistant

## 2021-06-24 DIAGNOSIS — Z95810 Presence of automatic (implantable) cardiac defibrillator: Secondary | ICD-10-CM

## 2021-06-24 DIAGNOSIS — I5042 Chronic combined systolic (congestive) and diastolic (congestive) heart failure: Secondary | ICD-10-CM | POA: Diagnosis not present

## 2021-06-24 NOTE — Progress Notes (Incomplete)
Assessment/Plan:   Douglas Carr is a very pleasant 56 y.o. 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  Latest CT of the head on 05/26/2021 without acute intracranial process, but atrophy and chronic microvascular ischemic changes were noted.  MoCA today     Recommendations:   Memory Loss   MRI brain with/without contrast to assess for underlying structural abnormality and assess vascular load  Check B12, TSH Discussed safety both in and out of the home.  Discussed the importance of regular daily schedule to maintain brain function.  Continue to monitor mood with PCP.  Monitor Driving Stay active at least 30 minutes at least 3 times a week.  Naps should be scheduled and should be no longer than 60 minutes and should not occur after 2 PM.  Control cardiovascular risk factors  Mediterranean diet is recommended  Folllow up in   months  Subjective:    The patient is seen in neurologic consultation at the request of [name], Fransisca Kaufmann, MD for the evaluation of memory.  The patient is accompanied by  who supplements the history. This is a 56 y.o. year old RH  male who has had memory issues for about   : Concern for personality changes that are different from what he had before and agitation and irritability, history of anoxic brain injury  Memory Repeats himself Disoriented when walking into a room Leaving objects  Ambulates   Falls Head injuries Wandering off  [place] with Mood Depression Irritability CW puzzles Word Finding Board Games Painting Coloring Sleeps Vivid Dreams Sleepwalking Hallucinations Paranoia Hygiene  concerns Bathing Dressing Medications pillbox Finances  Appetite  trouble swallowing.  Cooks.  stove on or the faucet on.   headaches, double vision, dizziness, focal numbness or tingling, unilateral weakness, tremors or anosmia. No history of seizures. Denies urine incontinence, retention, constipation or diarrhea.  Denies OSA, ETOH or Tobacco. Family History   MRI brain done 06/02/16 and demonstrated L thalamic infarct (acute). ( likely the result of anoxia from V. fib arrest).  MRI of the brain was completed on May 20, 2017 during that emergency room stay and demonstrated atrophy, more than one would expect for age and old bilateral parietal and right frontal lobe infarcts.  I personally reviewed this and most of atrophy was in cerebellar hemispheres. There was nothing acute. CT head 05/26/21 No acute intracranial process. Atrophy and chronic microvascularischemic changes.  CTA Head and Neck 1. 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 The intracranial portion was noted to be occluded on 06/09/2016; although there is no prior imaging of the cervical ICA or CCA, this is likely chronic. 2. Approximately 70% stenosis in the proximal left ICA. 3. 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 4. Occlusion of the right vertebral artery from its origin to themid V3 segment, where it is reconstituted via collateral vessel, with minimal faint opacification in the distal V2 and proximal V3,likely retrograde. 5. Focal  stenosis in the right PICA, just distal to its takeoff. 6. Mild narrowing in the distal petrous left ICA  No Known Allergies  Current Outpatient Medications  Medication Instructions   albuterol (VENTOLIN HFA) 108 (90 Base) MCG/ACT inhaler  2 puffs, Inhalation, Every 6 hours PRN   ARIPiprazole (ABILIFY) 5 mg, Oral, Daily   ASPIRIN LOW DOSE 81 MG EC tablet TAKE ONE TABLET BY MOUTH DAILY   atorvastatin (LIPITOR) 80 mg, Oral, Every evening   blood glucose meter kit and supplies KIT 1 each, Subcutaneous, 4 times daily PRN, Dispense based on patient and insurance preference. Use up to four times daily as directed. (FOR ICD-9 250.00, 250.01).   carvedilol (COREG) 3.125 mg, Oral, Every 12 hours   clopidogrel (PLAVIX) 75 MG tablet TAKE ONE TABLET BY MOUTH IN THE EVENING   DULoxetine (CYMBALTA) 60 MG capsule TAKE TWO CAPSULES BY MOUTH DAILY   glucose blood (ONETOUCH VERIO) test strip 1 each, Other, 4 times daily, use for testing   hydrOXYzine (ATARAX) 25 MG tablet TAKE ONE TABLET BY MOUTH THREE TIMES DAILY AS NEEDED   insulin glargine-yfgn (SEMGLEE) 8 Units, Subcutaneous, Daily at bedtime   Insulin Pen Needle (B-D ULTRAFINE III SHORT PEN) 31G X 8 MM MISC USE TO INJECT INSULIN DAILY DX E11.9   isosorbide dinitrate (ISORDIL) 5 mg, Oral, 2 times daily   mirtazapine (REMERON) 7.5 mg, Oral, Daily at bedtime, TAKE ONE TABLET BY MOUTH AT BEDTIME FOR DEPRESSION   OneTouch Delica Lancets 03U MISC Test blood sugars three times daily   OZEMPIC, 1 MG/DOSE, 4 MG/3ML SOPN Subcutaneous   primidone (MYSOLINE) 50 MG tablet TAKE ONE TABLET BY MOUTH AT BEDTIME   Semaglutide (2 MG/DOSE) 2 mg, Injection, Weekly   topiramate (TOPAMAX) 50 MG tablet TAKE ONE TABLET BY MOUTH AT BEDTIME AS NEEDED     VITALS:  There were no vitals filed for this visit. Depression screen [facility] 2/9 06/12/2021 04/10/2021 11/08/2020 04/12/2020 02/14/2020  Decreased Interest 3 0 0 0 0  Down, Depressed, Hopeless 2 0 0 0 0  PHQ - 2 Score 5 0 0 0 0  Altered sleeping 2 0 - - -  Tired, decreased energy 2 0 - - -  Change in appetite 3 0 - - -  Feeling bad or failure about yourself  3 0 - - -  Trouble concentrating 2 0 - - -  Moving slowly or fidgety/restless 2 0 - - -  Suicidal thoughts 0 0  - - -  PHQ-9 Score 19 0 - - -  Difficult doing work/chores - Not difficult at all - - -  Some recent data might be hidden    PHYSICAL EXAM   HEENT:  Normocephalic, atraumatic. The mucous membranes are moist. The superficial temporal arteries are without ropiness or tenderness. Cardiovascular: Regular rate and rhythm. Lungs: Clear to auscultation bilaterally. Neck: There are no carotid bruits noted bilaterally.  NEUROLOGICAL: No flowsheet data found. No flowsheet data found.  No flowsheet data found.   Orientation:  Alert and oriented to person, place and time. No aphasia or dysarthria. Fund of knowledge is appropriate. Recent memory impaired and remote memory intact.  Attention and concentration are normal.  Able to name objects and repeat phrases. Delayed recall   Cranial nerves: There is good facial symmetry. Extraocular muscles are intact and visual fields are full to confrontational testing. Speech is fluent and clear. Soft palate rises symmetrically and there is no tongue deviation. Hearing is intact to conversational tone. Tone: Tone  is good throughout. Sensation: Sensation is intact to light touch and pinprick throughout. Vibration is intact at the bilateral big toe.There is no extinction with double simultaneous stimulation. There is no sensory dermatomal level identified. Coordination: The patient has no difficulty with RAM's or FNF bilaterally. Normal finger to nose  Motor: Strength is 5/5 in the bilateral upper and lower extremities. There is no pronator drift. There are no fasciculations noted. DTR's: Deep tendon reflexes are 2/4 at the bilateral biceps, triceps, brachioradialis, patella and achilles.  Plantar responses are downgoing bilaterally. Gait and Station: The patient is able to ambulate without difficulty.The patient is able to heel toe walk without any difficulty.The patient is able to ambulate in a tandem fashion. The patient is able to stand in the Romberg position.      Thank you for allowing [place] the opportunity to participate in the care of this nice patient. Please do not hesitate to contact us for any questions or concerns.   Total time spent on today's visit was 60 minutes, including both face-to-face time and nonface-to-face time.  Time included that spent on review of records (prior notes available to me/labs/imaging if pertinent), discussing treatment and goals, answering patient's questions and coordinating care.  Cc:  [name], Fransisca Kaufmann, MD  Sharene Butters 06/24/2021 8:24 AM

## 2021-06-28 NOTE — Progress Notes (Signed)
EPIC Encounter for ICM Monitoring  Patient Name: Douglas Carr is a 56 y.o. male Date: 06/28/2021 Primary Care Physican: [name], [name], MD Primary Cardiologist: Allyson Sabal Electrophysiologist: Elberta Fortis 06/12/2021 Office Weight: 194 lbs                                                          Spoke with wife and heart failure questions reviewed.  Pt hospitalized 1/22-1/25 due to syncope.  He had orthostatic BP and no doing better since medication adjustments.  Dr [name] is managing his meds.  PRN Furosemide stopped at discharge due to low BP   Optivol thoracic impedance normal.    Prescribed: No diuretic   Labs: 05/28/2021 Creatinine 1.30, BUN 27, Potassium 4.2, Sodium 138, GFR >60 05/27/2021 Creatinine 1.36, BUN 39, Potassium 4.6, Sodium 135, GFR >60  05/26/2021 Creatinine 1.54, BUN 41, Potassium 6.3, Sodium 141, GFR 53  A complete set of results can be found in Results Review.   Recommendations:  No changes and encouraged to call if experiencing any fluid symptoms.   Follow-up plan: ICM clinic phone appointment on 07/29/2021.   91 day device clinic remote transmission 07/09/2021     EP/Cardiology Office Visits:  No recalls.  Advised to call and make appointment post hospitalization with Dr Allyson Sabal.  Missed 06/04/2021 OV with Dr Elberta Fortis and needs rescheduling.   Copy of ICM check sent to Dr. Elberta Fortis.     3 month ICM trend: 06/24/2021.    12-14 Month ICM trend:     Karie Soda, RN 06/28/2021 3:19 PM

## 2021-07-09 ENCOUNTER — Ambulatory Visit (INDEPENDENT_AMBULATORY_CARE_PROVIDER_SITE_OTHER): Payer: Medicare PPO

## 2021-07-09 DIAGNOSIS — I255 Ischemic cardiomyopathy: Secondary | ICD-10-CM

## 2021-07-09 LAB — CUP PACEART REMOTE DEVICE CHECK
Battery Remaining Longevity: 88 mo
Battery Voltage: 2.98 V
Brady Statistic RV Percent Paced: 0 %
Date Time Interrogation Session: 20230307082707
HighPow Impedance: 71 Ohm
Implantable Lead Implant Date: 20180226
Implantable Lead Location: 753860
Implantable Pulse Generator Implant Date: 20180226
Lead Channel Impedance Value: 304 Ohm
Lead Channel Impedance Value: 399 Ohm
Lead Channel Pacing Threshold Amplitude: 0.625 V
Lead Channel Pacing Threshold Pulse Width: 0.4 ms
Lead Channel Sensing Intrinsic Amplitude: 17.75 mV
Lead Channel Sensing Intrinsic Amplitude: 17.75 mV
Lead Channel Setting Pacing Amplitude: 2.5 V
Lead Channel Setting Pacing Pulse Width: 0.4 ms
Lead Channel Setting Sensing Sensitivity: 0.3 mV

## 2021-07-11 DIAGNOSIS — I639 Cerebral infarction, unspecified: Secondary | ICD-10-CM | POA: Diagnosis not present

## 2021-07-11 DIAGNOSIS — F0634 Mood disorder due to known physiological condition with mixed features: Secondary | ICD-10-CM | POA: Diagnosis not present

## 2021-07-12 ENCOUNTER — Encounter: Payer: Self-pay | Admitting: Family Medicine

## 2021-07-12 ENCOUNTER — Other Ambulatory Visit: Payer: Self-pay | Admitting: Family Medicine

## 2021-07-12 ENCOUNTER — Ambulatory Visit: Payer: Medicare PPO | Admitting: Family Medicine

## 2021-07-12 DIAGNOSIS — F32A Depression, unspecified: Secondary | ICD-10-CM

## 2021-07-12 DIAGNOSIS — F419 Anxiety disorder, unspecified: Secondary | ICD-10-CM

## 2021-07-15 ENCOUNTER — Telehealth: Payer: Self-pay | Admitting: Family Medicine

## 2021-07-15 NOTE — Telephone Encounter (Signed)
?  Left message for patient to call back and schedule Medicare Annual Wellness Visit (AWV) to be completed by video or phone. ? ?No hx of AWV eligible for AWVI per [facility] as of 02/03/2020 ? ?Please schedule at anytime with [facility] Nurse Health Advisor --- Germaine Pomfret ? ?45 Minutes appointment  ? ?Any questions, please call me at [phone]   ?

## 2021-07-17 ENCOUNTER — Other Ambulatory Visit: Payer: Self-pay | Admitting: Family Medicine

## 2021-07-18 DIAGNOSIS — Z1331 Encounter for screening for depression: Secondary | ICD-10-CM | POA: Diagnosis not present

## 2021-07-18 DIAGNOSIS — I639 Cerebral infarction, unspecified: Secondary | ICD-10-CM | POA: Diagnosis not present

## 2021-07-18 DIAGNOSIS — F0634 Mood disorder due to known physiological condition with mixed features: Secondary | ICD-10-CM | POA: Diagnosis not present

## 2021-07-23 NOTE — Progress Notes (Signed)
Remote ICD transmission.   

## 2021-07-24 ENCOUNTER — Ambulatory Visit (INDEPENDENT_AMBULATORY_CARE_PROVIDER_SITE_OTHER): Payer: Medicare PPO

## 2021-07-24 VITALS — Ht 70.0 in | Wt 194.0 lb

## 2021-07-24 DIAGNOSIS — I25119 Atherosclerotic heart disease of native coronary artery with unspecified angina pectoris: Secondary | ICD-10-CM | POA: Diagnosis not present

## 2021-07-24 DIAGNOSIS — E782 Mixed hyperlipidemia: Secondary | ICD-10-CM | POA: Diagnosis not present

## 2021-07-24 DIAGNOSIS — G931 Anoxic brain damage, not elsewhere classified: Secondary | ICD-10-CM

## 2021-07-24 DIAGNOSIS — Z0001 Encounter for general adult medical examination with abnormal findings: Secondary | ICD-10-CM | POA: Diagnosis not present

## 2021-07-24 DIAGNOSIS — N183 Chronic kidney disease, stage 3 unspecified: Secondary | ICD-10-CM

## 2021-07-24 DIAGNOSIS — I10 Essential (primary) hypertension: Secondary | ICD-10-CM | POA: Diagnosis not present

## 2021-07-24 DIAGNOSIS — E1169 Type 2 diabetes mellitus with other specified complication: Secondary | ICD-10-CM

## 2021-07-24 DIAGNOSIS — E669 Obesity, unspecified: Secondary | ICD-10-CM | POA: Diagnosis not present

## 2021-07-24 DIAGNOSIS — Z955 Presence of coronary angioplasty implant and graft: Secondary | ICD-10-CM

## 2021-07-24 DIAGNOSIS — Z0000 Encounter for general adult medical examination without abnormal findings: Secondary | ICD-10-CM

## 2021-07-24 NOTE — Patient Instructions (Signed)
[name] , ?Thank you for taking time to come for your Medicare Wellness Visit. I appreciate your ongoing commitment to your health goals. Please review the following plan we discussed and let me know if I can assist you in the future.  ? ?Screening recommendations/referrals: ?Colonoscopy: Cologuard order placed today.  ? ?Recommended yearly ophthalmology/optometry visit for glaucoma screening and checkup ?Recommended yearly dental visit for hygiene and checkup ? ?Vaccinations: ?Influenza vaccine: Done 03/15/2020 Repeat annually ? ?Pneumococcal vaccine: Done 04/12/2020, 11/08/2020 ?Tdap vaccine: Done 02/04/2019 Repeat in 10 years ? ?Shingles vaccine: Done 11/08/2020. Second dose due.   ?Covid-19: Done 07/08/2019, 08/25/2019, 06/08/2020 ? ?Advanced directives: Please bring a copy of your health care power of attorney and living will to the office to be added to your chart at your convenience. ? ? ?Conditions/risks identified: Aim for 30 minutes of exercise or brisk walking, 6-8 glasses of water, and 5 servings of fruits and vegetables each day. ? ? ?Next appointment: Follow up in one year for your annual wellness visit 2024. ? ?Preventive Care 40-64 Years, Male ?Preventive care refers to lifestyle choices and visits with your health care provider that can promote health and wellness. ?What does preventive care include? ?A yearly physical exam. This is also called an annual well check. ?Dental exams once or twice a year. ?Routine eye exams. Ask your health care provider how often you should have your eyes checked. ?Personal lifestyle choices, including: ?Daily care of your teeth and gums. ?Regular physical activity. ?Eating a healthy diet. ?Avoiding tobacco and drug use. ?Limiting alcohol use. ?Practicing safe sex. ?Taking low-dose aspirin every day starting at age 55. ?What happens during an annual well check? ?The services and screenings done by your health care provider during your annual well check will depend on your  age, overall health, lifestyle risk factors, and family history of disease. ?Counseling  ?Your health care provider may ask you questions about your: ?Alcohol use. ?Tobacco use. ?Drug use. ?Emotional well-being. ?Home and relationship well-being. ?Sexual activity. ?Eating habits. ?Work and work Astronomerenvironment. ?Screening  ?You may have the following tests or measurements: ?Height, weight, and BMI. ?Blood pressure. ?Lipid and cholesterol levels. These may be checked every 5 years, or more frequently if you are over 55 years old. ?Skin check. ?Lung cancer screening. You may have this screening every year starting at age 55 if you have a 30-pack-year history of smoking and currently smoke or have quit within the past 15 years. ?Fecal occult blood test (FOBT) of the stool. You may have this test every year starting at age 55. ?Flexible sigmoidoscopy or colonoscopy. You may have a sigmoidoscopy every 5 years or a colonoscopy every 10 years starting at age 55. ?Prostate cancer screening. Recommendations will vary depending on your family history and other risks. ?Hepatitis C blood test. ?Hepatitis B blood test. ?Sexually transmitted disease (STD) testing. ?Diabetes screening. This is done by checking your blood sugar (glucose) after you have not eaten for a while (fasting). You may have this done every 1-3 years. ?Discuss your test results, treatment options, and if necessary, the need for more tests with your health care provider. ?Vaccines  ?Your health care provider may recommend certain vaccines, such as: ?Influenza vaccine. This is recommended every year. ?Tetanus, diphtheria, and acellular pertussis (Tdap, Td) vaccine. You may need a Td booster every 10 years. ?Zoster vaccine. You may need this after age 55. ?Pneumococcal 13-valent conjugate (PCV13) vaccine. You may need this if you have certain conditions and have not  been vaccinated. ?Pneumococcal polysaccharide (PPSV23) vaccine. You may need one or two doses if you  smoke cigarettes or if you have certain conditions. ?Talk to your health care provider about which screenings and vaccines you need and how often you need them. ?This information is not intended to replace advice given to you by your health care provider. Make sure you discuss any questions you have with your health care provider. ?Document Released: 05/18/2015 Document Revised: 01/09/2016 Document Reviewed: 02/20/2015 ?Elsevier Interactive Patient Education ? 2017 Else[employer] ? ?Fall Prevention in the Home ?Falls can cause injuries. They can happen to people of all ages. There are many things you can do to make your home safe and to help prevent falls. ?What can I do on the outside of my home? ?Regularly fix the edges of walkways and driveways and fix any cracks. ?Remove anything that might make you trip as you walk through a door, such as a raised step or threshold. ?Trim any bushes or trees on the path to your home. ?Use bright outdoor lighting. ?Clear any walking paths of anything that might make someone trip, such as rocks or tools. ?Regularly check to see if handrails are loose or broken. Make sure that both sides of any steps have handrails. ?Any raised decks and porches should have guardrails on the edges. ?Have any leaves, snow, or ice cleared regularly. ?Use sand or salt on walking paths during winter. ?Clean up any spills in your garage right away. This includes oil or grease spills. ?What can I do in the bathroom? ?Use night lights. ?Install grab bars by the toilet and in the tub and shower. Do not use towel bars as grab bars. ?Use non-skid mats or decals in the tub or shower. ?If you need to sit down in the shower, use a plastic, non-slip stool. ?Keep the floor dry. Clean up any water that spills on the floor as soon as it happens. ?Remove soap buildup in the tub or shower regularly. ?Attach bath mats securely with double-sided non-slip rug tape. ?Do not have throw rugs and other things on the floor  that can make you trip. ?What can I do in the bedroom? ?Use night lights. ?Make sure that you have a light by your bed that is easy to reach. ?Do not use any sheets or blankets that are too big for your bed. They should not hang down onto the floor. ?Have a firm chair that has side arms. You can use this for support while you get dressed. ?Do not have throw rugs and other things on the floor that can make you trip. ?What can I do in the kitchen? ?Clean up any spills right away. ?Avoid walking on wet floors. ?Keep items that you use a lot in easy-to-reach places. ?If you need to reach something above you, use a strong step stool that has a grab bar. ?Keep electrical cords out of the way. ?Do not use floor polish or wax that makes floors slippery. If you must use wax, use non-skid floor wax. ?Do not have throw rugs and other things on the floor that can make you trip. ?What can I do with my stairs? ?Do not leave any items on the stairs. ?Make sure that there are handrails on both sides of the stairs and use them. Fix handrails that are broken or loose. Make sure that handrails are as long as the stairways. ?Check any carpeting to make sure that it is firmly attached to the stairs. Fix any  carpet that is loose or worn. ?Avoid having throw rugs at the top or bottom of the stairs. If you do have throw rugs, attach them to the floor with carpet tape. ?Make sure that you have a light switch at the top of the stairs and the bottom of the stairs. If you do not have them, ask someone to add them for you. ?What else can I do to help prevent falls? ?Wear shoes that: ?Do not have high heels. ?Have rubber bottoms. ?Are comfortable and fit you well. ?Are closed at the toe. Do not wear sandals. ?If you use a stepladder: ?Make sure that it is fully opened. Do not climb a closed stepladder. ?Make sure that both sides of the stepladder are locked into place. ?Ask someone to hold it for you, if possible. ?Clearly mark and make sure  that you can see: ?Any grab bars or handrails. ?First and last steps. ?Where the edge of each step is. ?Use tools that help you move around (mobility aids) if they are needed. These include: ?Canes. ?Environmental consultant

## 2021-07-24 NOTE — Progress Notes (Signed)
? ?Subjective:  ? Douglas Carr is a 56 y.o. male who presents for an Initial Medicare Annual Wellness Visit. ?Virtual Visit via Telephone Note ? ?I connected with  Douglas Carr on 07/24/21 at  2:00 PM EDT by telephone and verified that I am speaking with the correct person using two identifiers. ? ?Location: ?Patient: HOME ?Provider: [facility] ?Persons participating in the virtual visit: patient/Nurse Health Advisor ?  ?I discussed the limitations, risks, security and privacy concerns of performing an evaluation and management service by telephone and the availability of in person appointments. The patient expressed understanding and agreed to proceed. ? ?Interactive audio and video telecommunications were attempted between this nurse and patient, however failed, due to patient having technical difficulties OR patient did not have access to video capability.  We continued and completed visit with audio only. ? ?Some vital signs may be absent or patient reported.  ? ?Douglas Driver, LPN ? ?Review of Systems    ? ?Cardiac Risk Factors include: hypertension;dyslipidemia;male gender;sedentary lifestyle;diabetes mellitus;Other (see comment), Risk factor comments: Hx of CVA. ? ?   ?Objective:  ?  ?Today's Vitals  ? 07/24/21 1358  ?Weight: 194 lb (88 kg)  ?Height: 5' 10" (1.778 m)  ? ?Body mass index is 27.84 kg/m?. ? ? ?  07/24/2021  ?  2:09 PM 05/26/2021  ?  9:18 AM 01/26/2020  ?  3:34 AM 01/22/2020  ?  4:23 PM 04/20/2018  ? 12:59 PM 12/12/2016  ? 11:52 AM 09/08/2016  ? 11:21 AM  ?Advanced Directives  ?Does Patient Have a Medical Advance Directive? Yes Yes  No Yes No No  ?Type of Paramedic of [place];Living will Out of facility DNR (pink MOST or yellow form)   [place];Living will    ?Copy of [place] in Chart? No - copy requested        ?Would patient like information on creating a medical advance directive? No - Patient declined No - Patient  declined No - Patient declined    No - Patient declined  ? ? ?Current Medications (verified) ?Outpatient Encounter Medications as of 07/24/2021  ?Medication Sig  ? albuterol (VENTOLIN HFA) 108 (90 Base) MCG/ACT inhaler Inhale 2 puffs into the lungs every 6 (six) hours as needed for wheezing or shortness of breath.  ? ARIPiprazole (ABILIFY) 5 MG tablet Take 1 tablet (5 mg total) by mouth daily.  ? ASPIRIN LOW DOSE 81 MG EC tablet TAKE ONE TABLET BY MOUTH DAILY  ? atorvastatin (LIPITOR) 80 MG tablet Take 1 tablet (80 mg total) by mouth every evening.  ? blood glucose meter kit and supplies KIT Inject 1 each into the skin 4 (four) times daily as needed. Dispense based on patient and insurance preference. Use up to four times daily as directed. (FOR ICD-9 250.00, 250.01).  ? carvedilol (COREG) 3.125 MG tablet Take 1 tablet (3.125 mg total) by mouth every 12 (twelve) hours.  ? clopidogrel (PLAVIX) 75 MG tablet TAKE ONE TABLET BY MOUTH IN THE EVENING  ? DULoxetine (CYMBALTA) 60 MG capsule TAKE TWO CAPSULES BY MOUTH DAILY (Patient taking differently: Take 120 mg by mouth daily.)  ? glucose blood (ONETOUCH VERIO) test strip 1 each by Other route in the morning, at noon, in the evening, and at bedtime. use for testing  ? hydrOXYzine (ATARAX) 25 MG tablet TAKE ONE TABLET BY MOUTH THREE TIMES DAILY AS NEEDED  ? insulin glargine-yfgn (SEMGLEE) 100 UNIT/ML injection Inject 0.08  mLs (8 Units total) into the skin at bedtime.  ? Insulin Pen Needle (B-D ULTRAFINE III SHORT PEN) 31G X 8 MM MISC USE TO INJECT INSULIN DAILY DX E11.9  ? isosorbide dinitrate (ISORDIL) 5 MG tablet Take 1 tablet (5 mg total) by mouth 2 (two) times daily.  ? mirtazapine (REMERON) 7.5 MG tablet TAKE ONE TABLET BY MOUTH AT BEDTIME FOR DEPRESSION  ? OneTouch Delica Lancets 44Y MISC Test blood sugars three times daily  ? OZEMPIC, 1 MG/DOSE, 4 MG/3ML SOPN Inject into the skin.  ? primidone (MYSOLINE) 50 MG tablet TAKE ONE TABLET BY MOUTH AT BEDTIME  ?  Semaglutide, 2 MG/DOSE, 8 MG/3ML SOPN Inject 2 mg as directed once a week.  ? tamsulosin (FLOMAX) 0.4 MG CAPS capsule Take 0.4 mg by mouth daily.  ? topiramate (TOPAMAX) 50 MG tablet TAKE ONE TABLET BY MOUTH AT BEDTIME AS NEEDED  ? ?No facility-administered encounter medications on file as of 07/24/2021.  ? ? ?Allergies (verified) ?Patient has no known allergies.  ? ?History: ?Past Medical History:  ?Diagnosis Date  ? Acute ST elevation myocardial infarction (STEMI) involving left circumflex coronary artery ([place]) 05/07/2016  ? PCI to Cx-OM  ? Anginal pain ([place])   ? secondary to sm. vessel disease  ? Anxiety   ? Arthritis   ? BIL KNEE PAIN AND BIL ANKLE PAIN  ? Bone spur of ankle   ? Cardiac arrest ([place]) 05/24/2016  ? with v fib  ? 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 Coronary artery disease involving native coronary artery of native heart with angina pectoris ([place]) 05/24/2016  ? Remote MI at 56 years of age, Last cath 2010-diffuse non-obstructive disease; last echo 06/16/08 -normal LV function, moderate concentric hypertrophy; nuc 08/2008 no ischemia;  medical therapy; STEMI May 07 2016 - PCI to Cx-OM  ? Diabetes mellitus   ? ON ORAL MEDICATION AND INSULIN  ? Dilated cardiomyopathy ([place]) 05/24/2016  ? EF 325-30% by Echo post STEMI (previously 30-35%)   ? Hyperlipidemia   ? Hypertension   ? Myocardial infarction [facility]) 1996  ? Post MI  ? Peripheral vascular disease ([place])   ? HAS LEFT CAROTID ARTERY STENOSIS   AND IS S/P RIGHT CAROTID ENDARTERECTOMY 2010 Last carotid dopplers 01/08/2012 wth patent endarterectomy site  ? Status post coronary artery stent placement   ? ?Past Surgical History:  ?Procedure Laterality Date  ? APPENDECTOMY    ? CARDIAC CATHETERIZATION    ? FEB 2010, significant branch vessel disease wth diag, marginal, PDA & PLA, nml. LV function  ? CARDIAC CATHETERIZATION N/A 05/07/2016  ? Procedure: Left Heart Cath and Coronary Angiography;  Surgeon: Douglas M  Martinique, MD;  Location: [place] [facility];  Service: Cardiovascular;  Laterality: N/A;  ? CARDIAC CATHETERIZATION N/A 05/07/2016  ? Procedure: Coronary Stent Intervention;  Surgeon: Douglas M Martinique, MD;  Location: [place] [facility];  Service: Cardiovascular;  Laterality: N/A;  ? CARDIAC CATHETERIZATION N/A 05/24/2016  ? Procedure: Left Heart Cath and Coronary Angiography;  Surgeon: [name], MD;  Location: [place] [facility];  Service: Cardiovascular;  Laterality: N/A;  ? CARDIAC CATHETERIZATION N/A 05/24/2016  ? Procedure: Coronary Stent Intervention;  Surgeon: [name], MD;  Location: [place] [facility];  Service: Cardiovascular;  Laterality: N/A;  2.5x20 Promus to Ostial/proximal circumflex  ? CAROTID ENDARTERECTOMY  09/2008  ? Rt CEA  ? CLIPPING OF ATRIAL APPENDAGE Left 01/27/2020  ? Procedure:  CLIPPING OF ATRIAL APPENDAGE USING ATRICURE 76 MM ATRICLIP;  Surgeon: Wonda Olds, MD;  Location: [facility];  Service: Open Heart Surgery;  Laterality: Left;  ? CORONARY ARTERY BYPASS GRAFT N/A 01/27/2020  ? Procedure: CORONARY ARTERY BYPASS GRAFTING (CABG), ON PUMP, TIMES FIVE, USING BILATERAL MAMMARY ARTERIES, LEFT RADIAL ARTERY, AND ENDOSCOPICALLY HARVESTED RIGHT GREATER SAPHENOUS VEIN.;  Surgeon: Wonda Olds, MD;  Location: [facility];  Service: Open Heart Surgery;  Laterality: N/A;  LIMA->LAD ?RIMA->D1 (free graft) ?LRA-> OM ?SVG-> PDA->PL  ? ENDOVEIN HARVEST OF GREATER SAPHENOUS VEIN Right 01/27/2020  ? Procedure: ENDOVEIN HARVEST OF GREATER SAPHENOUS VEIN;  Surgeon: Wonda Olds, MD;  Location: [place];  Service: Open Heart Surgery;  Laterality: Right;  ? ICD IMPLANT N/A 06/30/2016  ? Procedure: ICD Implant;  Surgeon: Will Meredith Leeds, MD;  Location: [place] [facility];  Service: Cardiovascular;  Laterality: N/A;  ? INTRAVASCULAR PRESSURE WIRE/FFR STUDY N/A 01/24/2020  ? Procedure: INTRAVASCULAR PRESSURE WIRE/FFR STUDY;  Surgeon: Belva Crome, MD;  Location: [place] [facility] [facility];  Service:  Cardiovascular;  Laterality: N/A;  ? LUNG BIOPSY  2013  ? Bx's suggest granulomatous dz.  ? RADIAL ARTERY HARVEST Left 01/27/2020  ? Procedure: RADIAL ARTERY HARVEST;  Surgeon: Wonda Olds, MD;  Location: [facility];  Service: Open

## 2021-07-26 DIAGNOSIS — I639 Cerebral infarction, unspecified: Secondary | ICD-10-CM | POA: Diagnosis not present

## 2021-07-26 DIAGNOSIS — F0634 Mood disorder due to known physiological condition with mixed features: Secondary | ICD-10-CM | POA: Diagnosis not present

## 2021-07-29 ENCOUNTER — Ambulatory Visit (INDEPENDENT_AMBULATORY_CARE_PROVIDER_SITE_OTHER): Payer: Medicare PPO

## 2021-07-29 DIAGNOSIS — Z95810 Presence of automatic (implantable) cardiac defibrillator: Secondary | ICD-10-CM | POA: Diagnosis not present

## 2021-07-29 DIAGNOSIS — I5042 Chronic combined systolic (congestive) and diastolic (congestive) heart failure: Secondary | ICD-10-CM | POA: Diagnosis not present

## 2021-07-31 NOTE — Progress Notes (Signed)
EPIC Encounter for ICM Monitoring ? ?Patient Name: Douglas Carr is a 56 y.o. male ?Date: 07/31/2021 ?Primary Care Physican: [name], [name], MD ?Primary Cardiologist: Allyson Sabal ?Electrophysiologist: Camnitz ?07/31/2021 Weight: 194 lbs ?                                                         ?Spoke with wife and heart failure questions reviewed.  Pt is feeling well. ?  ?Optivol thoracic impedance normal.  ?  ?Prescribed: No diuretic ?  ?Labs: ?05/28/2021 Creatinine 1.30, BUN 27, Potassium 4.2, Sodium 138, GFR >60 ?05/27/2021 Creatinine 1.36, BUN 39, Potassium 4.6, Sodium 135, GFR >60  ?05/26/2021 Creatinine 1.54, BUN 41, Potassium 6.3, Sodium 141, GFR 53  ?A complete set of results can be found in Results Review. ?  ?Recommendations:  No changes and encouraged to call if experiencing any fluid symptoms. ?  ?Follow-up plan: ICM clinic phone appointment on 09/02/2021.   91 day device clinic remote transmission 10/08/2021.   ?  ?EP/Cardiology Office Visits:    Advised message sent to EP scheduler to call her since patient's last OV with Dr Elberta Fortis was 2018. ?  ?Copy of ICM check sent to Dr. Elberta Fortis.   ?  ?3 month ICM trend: 07/29/2021. ? ? ? ?12-14 Month ICM trend:  ? ? ? ?Karie Soda, RN ?07/31/2021 ?3:13 PM ? ?

## 2021-08-01 DIAGNOSIS — I639 Cerebral infarction, unspecified: Secondary | ICD-10-CM | POA: Diagnosis not present

## 2021-08-01 DIAGNOSIS — F0634 Mood disorder due to known physiological condition with mixed features: Secondary | ICD-10-CM | POA: Diagnosis not present

## 2021-08-02 ENCOUNTER — Encounter: Payer: Self-pay | Admitting: Family Medicine

## 2021-08-02 ENCOUNTER — Ambulatory Visit (INDEPENDENT_AMBULATORY_CARE_PROVIDER_SITE_OTHER): Payer: Medicare PPO | Admitting: Family Medicine

## 2021-08-02 VITALS — BP 148/96 | HR 87 | Ht 70.0 in | Wt 189.2 lb

## 2021-08-02 DIAGNOSIS — Z23 Encounter for immunization: Secondary | ICD-10-CM | POA: Diagnosis not present

## 2021-08-02 DIAGNOSIS — F419 Anxiety disorder, unspecified: Secondary | ICD-10-CM

## 2021-08-02 DIAGNOSIS — F32A Depression, unspecified: Secondary | ICD-10-CM

## 2021-08-02 DIAGNOSIS — I10 Essential (primary) hypertension: Secondary | ICD-10-CM

## 2021-08-02 LAB — BAYER DCA HB A1C WAIVED: HB A1C (BAYER DCA - WAIVED): 7.2 % — ABNORMAL HIGH (ref 4.8–5.6)

## 2021-08-02 NOTE — Addendum Note (Signed)
Addended by: Alphonzo Dublin on: 08/02/2021 04:13 PM ? ? Modules accepted: Orders ? ?

## 2021-08-02 NOTE — Addendum Note (Signed)
Addended by: Dorene Sorrow on: 08/02/2021 04:40 PM ? ? Modules accepted: Orders ? ?

## 2021-08-02 NOTE — Progress Notes (Signed)
? ?BP (!) 148/96   Pulse 87   Ht 5' 10"  (1.778 m)   Wt 189 lb 4 oz (85.8 kg)   SpO2 100%   BMI 27.15 kg/m?   ? ?Subjective:  ? ?Patient ID: Douglas Carr, male    DOB: 1965-09-11, 56 y.o.   MRN: [id number] ? ?HPI: ?[name] is a 56 y.o. male presenting on 08/02/2021 for syncope and collapse (53mfollow up) ? ? ?HPI ?Syncope and collapse follow-up ?Patient is coming in for follow-up for syncope and hypertension and blood pressure.  He says he does not have any more lightheadedness or dizziness or feeling to pass out.  His blood pressure is running up a little bit more and is 148/96 and he says it runs between there and slightly lower than there at home.  He does not know that he is taking that isosorbide 5 mg twice daily, that was a lower dose.  He does not know for sure about all of his medicines and is going to go home and check on some of them. ? ?Mood wise he feels like he is doing a lot better with his current medicine and the Abilify is helping sufficiently. ? ?Relevant past medical, surgical, family and social history reviewed and updated as indicated. Interim medical history since our last visit reviewed. ?Allergies and medications reviewed and updated. ? ?Review of Systems  ?Constitutional:  Negative for chills and fever.  ?Eyes:  Negative for visual disturbance.  ?Respiratory:  Negative for shortness of breath and wheezing.   ?Cardiovascular:  Negative for chest pain and leg swelling.  ?Musculoskeletal:  Negative for back pain and gait problem.  ?Skin:  Negative for rash.  ?Neurological:  Negative for dizziness, weakness and light-headedness.  ?All other systems reviewed and are negative. ? ?Per HPI unless specifically indicated above ? ? ?Allergies as of 08/02/2021   ?No Known Allergies ?  ? ?  ?Medication List  ?  ? ?  ? Accurate as of August 02, 2021  2:07 PM. If you have any questions, ask your nurse or doctor.  ?  ?  ? ?  ? ?albuterol 108 (90 Base) MCG/ACT inhaler ?Commonly known as:  VENTOLIN HFA ?Inhale 2 puffs into the lungs every 6 (six) hours as needed for wheezing or shortness of breath. ?  ?ARIPiprazole 5 MG tablet ?Commonly known as: Abilify ?Take 1 tablet (5 mg total) by mouth daily. ?  ?Aspirin Low Dose 81 MG EC tablet ?Generic drug: aspirin ?TAKE ONE TABLET BY MOUTH DAILY ?  ?atorvastatin 80 MG tablet ?Commonly known as: LIPITOR ?Take 1 tablet (80 mg total) by mouth every evening. ?  ?B-D ULTRAFINE III SHORT PEN 31G X 8 MM Misc ?Generic drug: Insulin Pen Needle ?USE TO INJECT INSULIN DAILY DX E11.9 ?  ?blood glucose meter kit and supplies Kit ?Inject 1 each into the skin 4 (four) times daily as needed. Dispense based on patient and insurance preference. Use up to four times daily as directed. (FOR ICD-9 250.00, 250.01). ?  ?carvedilol 3.125 MG tablet ?Commonly known as: COREG ?Take 1 tablet (3.125 mg total) by mouth every 12 (twelve) hours. ?  ?clopidogrel 75 MG tablet ?Commonly known as: PLAVIX ?TAKE ONE TABLET BY MOUTH IN THE EVENING ?  ?DULoxetine 60 MG capsule ?Commonly known as: CYMBALTA ?TAKE TWO CAPSULES BY MOUTH DAILY ?  ?hydrOXYzine 25 MG tablet ?Commonly known as: ATARAX ?TAKE ONE TABLET BY MOUTH THREE TIMES DAILY AS NEEDED ?  ?insulin glargine-yfgn 100  UNIT/ML injection ?Commonly known as: SEMGLEE ?Inject 0.08 mLs (8 Units total) into the skin at bedtime. ?  ?isosorbide dinitrate 5 MG tablet ?Commonly known as: ISORDIL ?Take 1 tablet (5 mg total) by mouth 2 (two) times daily. ?  ?mirtazapine 7.5 MG tablet ?Commonly known as: REMERON ?TAKE ONE TABLET BY MOUTH AT BEDTIME FOR DEPRESSION ?  ?OneTouch Delica Lancets 56L Misc ?Test blood sugars three times daily ?  ?OneTouch Verio test strip ?Generic drug: glucose blood ?1 each by Other route in the morning, at noon, in the evening, and at bedtime. use for testing ?  ?primidone 50 MG tablet ?Commonly known as: MYSOLINE ?TAKE ONE TABLET BY MOUTH AT BEDTIME ?  ?Semaglutide (2 MG/DOSE) 8 MG/3ML Sopn ?Inject 2 mg as directed once a  week. ?  ?Ozempic (1 MG/DOSE) 4 MG/3ML Sopn ?Generic drug: Semaglutide (1 MG/DOSE) ?Inject into the skin. ?  ?tamsulosin 0.4 MG Caps capsule ?Commonly known as: FLOMAX ?Take 0.4 mg by mouth daily. ?  ?topiramate 50 MG tablet ?Commonly known as: TOPAMAX ?TAKE ONE TABLET BY MOUTH AT BEDTIME AS NEEDED ?  ? ?  ? ? ? ?Objective:  ? ?BP (!) 148/96   Pulse 87   Ht 5' 10"  (1.778 m)   Wt 189 lb 4 oz (85.8 kg)   SpO2 100%   BMI 27.15 kg/m?   ?Wt Readings from Last 3 Encounters:  ?08/02/21 189 lb 4 oz (85.8 kg)  ?07/24/21 194 lb (88 kg)  ?06/12/21 194 lb (88 kg)  ?  ?Physical Exam ?Vitals and nursing note reviewed.  ?Constitutional:   ?   General: He is not in acute distress. ?   Appearance: He is well-developed. He is not diaphoretic.  ?Eyes:  ?   General: No scleral icterus. ?   Conjunctiva/sclera: Conjunctivae normal.  ?Neck:  ?   Thyroid: No thyromegaly.  ?Cardiovascular:  ?   Rate and Rhythm: Normal rate and regular rhythm.  ?   Heart sounds: Normal heart sounds. No murmur heard. ?Pulmonary:  ?   Effort: Pulmonary effort is normal. No respiratory distress.  ?   Breath sounds: Normal breath sounds. No wheezing.  ?Musculoskeletal:     ?   General: No swelling. Normal range of motion.  ?   Cervical back: Neck supple.  ?Lymphadenopathy:  ?   Cervical: No cervical adenopathy.  ?Skin: ?   General: Skin is warm and dry.  ?   Findings: No rash.  ?Neurological:  ?   Mental Status: He is alert and oriented to person, place, and time.  ?   Coordination: Coordination normal.  ?Psychiatric:     ?   Behavior: Behavior normal.  ? ? ? ? ?Assessment & Plan:  ? ?Problem List Items Addressed This Visit   ? ?  ? Cardiovascular and Mediastinum  ? Essential hypertension - Primary (Chronic)  ? Relevant Orders  ? CBC with Differential/Platelet  ? CMP14+EGFR  ? Lipid panel  ? Bayer DCA Hb A1c Waived  ?  ? Other  ? Anxiety and depression  ?  ?We will do blood work today, continue current medicine. ?Follow up plan: ?Return in about 3 months  (around 11/01/2021), or if symptoms worsen or fail to improve, for Diabetes and hypertension recheck. ? ?Counseling provided for all of the vaccine components ?Orders Placed This Encounter  ?Procedures  ? CBC with Differential/Platelet  ? CMP14+EGFR  ? Lipid panel  ? Bayer DCA Hb A1c Waived  ? ? ?Caryl Pina, MD ?Tristan Schroeder  [place] ?08/02/2021, 2:07 PM ? ? ? ? ?

## 2021-08-03 LAB — CBC WITH DIFFERENTIAL/PLATELET
Basophils Absolute: 0.1 10*3/uL (ref 0.0–0.2)
Basos: 1 %
EOS (ABSOLUTE): 0.2 10*3/uL (ref 0.0–0.4)
Eos: 2 %
Hematocrit: 40.6 % (ref 37.5–51.0)
Hemoglobin: 13.7 g/dL (ref 13.0–17.7)
Immature Grans (Abs): 0 10*3/uL (ref 0.0–0.1)
Immature Granulocytes: 0 %
Lymphocytes Absolute: 2.5 10*3/uL (ref 0.7–3.1)
Lymphs: 29 %
MCH: 28.4 pg (ref 26.6–33.0)
MCHC: 33.7 g/dL (ref 31.5–35.7)
MCV: 84 fL (ref 79–97)
Monocytes Absolute: 0.7 10*3/uL (ref 0.1–0.9)
Monocytes: 8 %
Neutrophils Absolute: 5.2 10*3/uL (ref 1.4–7.0)
Neutrophils: 60 %
Platelets: 188 10*3/uL (ref 150–450)
RBC: 4.82 x10E6/uL (ref 4.14–5.80)
RDW: 13.1 % (ref 11.6–15.4)
WBC: 8.6 10*3/uL (ref 3.4–10.8)

## 2021-08-03 LAB — LIPID PANEL
Chol/HDL Ratio: 2.4 ratio (ref 0.0–5.0)
Cholesterol, Total: 96 mg/dL — ABNORMAL LOW (ref 100–199)
HDL: 40 mg/dL (ref >39)
LDL Chol Calc (NIH): 41 mg/dL (ref 0–99)
Triglycerides: 72 mg/dL (ref 0–149)
VLDL Cholesterol Cal: 15 mg/dL (ref 5–40)

## 2021-08-03 LAB — CMP14+EGFR
ALT: 28 IU/L (ref 0–44)
AST: 14 IU/L (ref 0–40)
Albumin/Globulin Ratio: 1.7 (ref 1.2–2.2)
Albumin: 4.4 g/dL (ref 3.8–4.9)
Alkaline Phosphatase: 108 IU/L (ref 44–121)
BUN/Creatinine Ratio: 18 (ref 9–20)
BUN: 23 mg/dL (ref 6–24)
Bilirubin Total: 0.3 mg/dL (ref 0.0–1.2)
CO2: 21 mmol/L (ref 20–29)
Calcium: 9.1 mg/dL (ref 8.7–10.2)
Chloride: 104 mmol/L (ref 96–106)
Creatinine, Ser: 1.27 mg/dL (ref 0.76–1.27)
Globulin, Total: 2.6 g/dL (ref 1.5–4.5)
Glucose: 115 mg/dL — ABNORMAL HIGH (ref 70–99)
Potassium: 4.6 mmol/L (ref 3.5–5.2)
Sodium: 137 mmol/L (ref 134–144)
Total Protein: 7 g/dL (ref 6.0–8.5)
eGFR: 67 mL/min/{1.73_m2} (ref >59)

## 2021-08-05 ENCOUNTER — Telehealth: Payer: Self-pay

## 2021-08-05 NOTE — Chronic Care Management (AMB) (Signed)
?  Care Management  ? ?Outreach Note ? ?08/05/2021 ?Name: ZENO HICKEL MRN: [id number] DOB: [birth year] ? ?Referred by: [name], [name], MD ?Reason for referral : Care Coordination (Outreach to schedule referral with [facility] ) ? ? ?An unsuccessful telephone outreach was attempted today. The patient was referred to the case management team for assistance with care management and care coordination.  ? ?Follow Up Plan:  ?The care management team will reach out to the patient again over the next 7 days.  ?If patient returns call to provider office, please advise to call Embedded Care Management Care Guide Penne Lash * at [phone]* ? ?Penne Lash, RMA ?Care Guide, Embedded Care Coordination ?[place] [facility]  Care Management  ?[place], [place] [address] ?Direct Dial: [phone] ?[email or web address] ?Website: [email or web address]  ? ?

## 2021-08-08 DIAGNOSIS — I639 Cerebral infarction, unspecified: Secondary | ICD-10-CM | POA: Diagnosis not present

## 2021-08-08 DIAGNOSIS — F0634 Mood disorder due to known physiological condition with mixed features: Secondary | ICD-10-CM | POA: Diagnosis not present

## 2021-08-09 ENCOUNTER — Other Ambulatory Visit: Payer: Self-pay | Admitting: Family Medicine

## 2021-08-09 DIAGNOSIS — E782 Mixed hyperlipidemia: Secondary | ICD-10-CM

## 2021-08-13 ENCOUNTER — Other Ambulatory Visit: Payer: Self-pay | Admitting: Family Medicine

## 2021-08-13 ENCOUNTER — Telehealth: Payer: Self-pay | Admitting: Family Medicine

## 2021-08-13 DIAGNOSIS — F419 Anxiety disorder, unspecified: Secondary | ICD-10-CM

## 2021-08-13 MED ORDER — CLOPIDOGREL BISULFATE 75 MG PO TABS: 75.0000 mg | ORAL_TABLET | Freq: Every evening | ORAL | 0 refills | Status: DC

## 2021-08-13 MED ORDER — DULOXETINE HCL 60 MG PO CPEP: 120.0000 mg | ORAL_CAPSULE | Freq: Every day | ORAL | 0 refills | Status: DC

## 2021-08-13 MED ORDER — SEMAGLUTIDE (2 MG/DOSE) 8 MG/3ML ~~LOC~~ SOPN: 2.0000 mg | PEN_INJECTOR | SUBCUTANEOUS | 1 refills | Status: DC

## 2021-08-13 MED ORDER — HYDROXYZINE HCL 25 MG PO TABS: 25.0000 mg | ORAL_TABLET | Freq: Three times a day (TID) | ORAL | 2 refills | Status: DC | PRN

## 2021-08-13 NOTE — Telephone Encounter (Signed)
I sent in Ozempic 2 mg as it is the one Dr. Louanne Skye had prescribed most recently. ?

## 2021-08-13 NOTE — Telephone Encounter (Signed)
?  Prescription Request ? ?08/13/2021 ? ?Is this a "Controlled Substance" medicine? no ? ?Have you seen your PCP in the last 2 weeks? yes ? ?If YES, route message to pool  -  If NO, patient needs to be scheduled for appointment. ? ?What is the name of the medication or equipment? hydrOXYzine (ATARAX) 25 MG tablet ?OZEMPIC, 1 MG/DOSE, 4 MG/3ML SOPN ?clopidogrel (PLAVIX) 75 MG tablet ?DULoxetine (CYMBALTA) 60 MG capsule ? ?Have you contacted your pharmacy to request a refill? Pharmacy calling  ? ?Which pharmacy would you like this sent to? [facility] - [address] [place], [place] - [address] (Ph: [phone]) ? ? ?Patient notified that their request is being sent to the clinical staff for review and that they should receive a response within 2 business days.  ?  ?

## 2021-08-13 NOTE — Telephone Encounter (Signed)
Please verify dose of Ozempic pt is on for refill ?All other medication refills have been sent ?

## 2021-08-22 ENCOUNTER — Other Ambulatory Visit: Payer: Self-pay

## 2021-08-22 DIAGNOSIS — F0634 Mood disorder due to known physiological condition with mixed features: Secondary | ICD-10-CM | POA: Diagnosis not present

## 2021-08-22 DIAGNOSIS — I6523 Occlusion and stenosis of bilateral carotid arteries: Secondary | ICD-10-CM

## 2021-08-22 DIAGNOSIS — I639 Cerebral infarction, unspecified: Secondary | ICD-10-CM | POA: Diagnosis not present

## 2021-08-28 NOTE — Progress Notes (Deleted)
Electrophysiology Office Note Date: 08/28/2021  ID:  Douglas Carr, DOB 10-Dec-1965, MRN [id number]  PCP: [name], Fransisca Kaufmann, MD Primary Cardiologist: Quay Burow, MD Electrophysiologist: Constance Haw, MD   CC: Routine ICD follow-up  Douglas Carr is a 56 y.o. male seen today for Will Meredith Leeds, MD for routine electrophysiology followup after long absence. Not seen in clinic since 10/2016.Marland Kitchen  Since last being seen in our clinic the patient reports doing ***.  he denies chest pain, palpitations, dyspnea, PND, orthopnea, nausea, vomiting, dizziness, syncope, edema, weight gain, or early satiety. {He/she (caps):30048} has not had ICD shocks.   Device History: Medtronic Dual Chamber ICD implanted 06/2016 for ICM/CHF. H/o VT/VF arrest.   Past Medical History:  Diagnosis Date   Acute ST elevation myocardial infarction (STEMI) involving left circumflex coronary artery ([place]) 05/07/2016   PCI to Cx-OM   Anginal pain (HCC)    secondary to sm. 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 05/13/2016   Coronary artery disease involving native coronary artery of native heart with angina pectoris ([place]) 05/24/2016   Remote MI at 56 years of age, Last cath 2010-diffuse non-obstructive disease; last echo 06/16/08 -normal LV function, moderate concentric hypertrophy; nuc 08/2008 no ischemia;  medical therapy; STEMI May 07 2016 - PCI to Cx-OM   Diabetes mellitus    ON ORAL MEDICATION AND INSULIN   Dilated cardiomyopathy ([place]) 05/24/2016   EF 325-30% by Echo post STEMI (previously 30-35%)    Hyperlipidemia    Hypertension    Myocardial infarction ([place]) 1996   Post MI   Peripheral vascular disease ([employer])    HAS LEFT CAROTID ARTERY STENOSIS   AND IS S/P RIGHT CAROTID ENDARTERECTOMY 2010 Last carotid  dopplers 01/08/2012 wth patent endarterectomy site   Status post coronary artery stent placement    Past Surgical History:  Procedure Laterality Date   APPENDECTOMY     CARDIAC CATHETERIZATION     FEB 2010, significant branch vessel disease wth diag, marginal, PDA & PLA, nml. LV function   CARDIAC CATHETERIZATION N/A 05/07/2016   Procedure: Left Heart Cath and Coronary Angiography;  Surgeon: Peter M Martinique, MD;  Location: [facility];  Service: Cardiovascular;  Laterality: N/A;   CARDIAC CATHETERIZATION N/A 05/07/2016   Procedure: Coronary Stent Intervention;  Surgeon: Peter M Martinique, MD;  Location: [facility];  Service: Cardiovascular;  Laterality: N/A;   CARDIAC CATHETERIZATION N/A 05/24/2016   Procedure: Left Heart Cath and Coronary Angiography;  Surgeon: [name], MD;  Location: [facility];  Service: Cardiovascular;  Laterality: N/A;   CARDIAC CATHETERIZATION N/A 05/24/2016   Procedure: Coronary Stent Intervention;  Surgeon: [name], MD;  Location: [facility];  Service: Cardiovascular;  Laterality: N/A;  2.5x20 Promus to Ostial/proximal circumflex   CAROTID ENDARTERECTOMY  09/2008   Rt CEA   CLIPPING OF ATRIAL APPENDAGE Left 01/27/2020   Procedure: CLIPPING OF ATRIAL APPENDAGE USING ATRICURE 49 MM ATRICLIP;  Surgeon: Wonda Olds, MD;  Location: [facility];  Service: Open Heart Surgery;  Laterality: Left;   CORONARY ARTERY BYPASS GRAFT N/A 01/27/2020   Procedure: CORONARY ARTERY BYPASS GRAFTING (CABG), ON PUMP, TIMES FIVE, USING BILATERAL MAMMARY ARTERIES, LEFT RADIAL  ARTERY, AND ENDOSCOPICALLY HARVESTED RIGHT GREATER SAPHENOUS VEIN.;  Surgeon: Wonda Olds, MD;  Location: [facility];  Service: Open Heart Surgery;  Laterality: N/A;  LIMA->LAD RIMA->D1 (free graft) LRA-> OM SVG-> PDA->PL   ENDOVEIN HARVEST OF GREATER SAPHENOUS VEIN Right 01/27/2020   Procedure: ENDOVEIN HARVEST OF GREATER SAPHENOUS VEIN;  Surgeon: Wonda Olds, MD;  Location: [facility];   Service: Open Heart Surgery;  Laterality: Right;   ICD IMPLANT N/A 06/30/2016   Procedure: ICD Implant;  Surgeon: Will Meredith Leeds, MD;  Location: [facility];  Service: Cardiovascular;  Laterality: N/A;   INTRAVASCULAR PRESSURE WIRE/FFR STUDY N/A 01/24/2020   Procedure: INTRAVASCULAR PRESSURE WIRE/FFR STUDY;  Surgeon: Belva Crome, MD;  Location: [facility];  Service: Cardiovascular;  Laterality: N/A;   LUNG BIOPSY  2013   Bx's suggest granulomatous dz.   RADIAL ARTERY HARVEST Left 01/27/2020   Procedure: RADIAL ARTERY HARVEST;  Surgeon: Wonda Olds, MD;  Location: [facility];  Service: Open Heart Surgery;  Laterality: Left;   RIGHT/LEFT HEART CATH AND CORONARY ANGIOGRAPHY N/A 01/24/2020   Procedure: RIGHT/LEFT HEART CATH AND CORONARY ANGIOGRAPHY;  Surgeon: Belva Crome, MD;  Location: [facility];  Service: Cardiovascular;  Laterality: N/A;   RT ANKLE   2013   TEE WITHOUT CARDIOVERSION N/A 01/27/2020   Procedure: TRANSESOPHAGEAL ECHOCARDIOGRAM (TEE);  Surgeon: Wonda Olds, MD;  Location: [facility];  Service: Open Heart Surgery;  Laterality: N/A;    Current Outpatient Medications  Medication Sig Dispense Refill   albuterol (VENTOLIN HFA) 108 (90 Base) MCG/ACT inhaler Inhale 2 puffs into the lungs every 6 (six) hours as needed for wheezing or shortness of breath. 8 g 2   ARIPiprazole (ABILIFY) 5 MG tablet Take 1 tablet (5 mg total) by mouth daily. 90 tablet 1   ASPIRIN LOW DOSE 81 MG EC tablet TAKE ONE TABLET BY MOUTH DAILY 90 tablet 0   atorvastatin (LIPITOR) 80 MG tablet TAKE ONE TABLET BY MOUTH IN THE EVENING 90 tablet 0   blood glucose meter kit and supplies KIT Inject 1 each into the skin 4 (four) times daily as needed. Dispense based on patient and insurance preference. Use up to four times daily as directed. (FOR ICD-9 250.00, 250.01). 1 each 0   carvedilol (COREG) 3.125 MG tablet Take 1 tablet (3.125 mg total) by mouth every 12 (twelve) hours. 60 tablet 1    clopidogrel (PLAVIX) 75 MG tablet Take 1 tablet (75 mg total) by mouth every evening. 90 tablet 0   DULoxetine (CYMBALTA) 60 MG capsule Take 2 capsules (120 mg total) by mouth daily. 180 capsule 0   glucose blood (ONETOUCH VERIO) test strip 1 each by Other route in the morning, at noon, in the evening, and at bedtime. use for testing 120 each 11   hydrOXYzine (ATARAX) 25 MG tablet Take 1 tablet (25 mg total) by mouth 3 (three) times daily as needed. 90 tablet 2   insulin glargine-yfgn (SEMGLEE) 100 UNIT/ML injection Inject 0.08 mLs (8 Units total) into the skin at bedtime. 10 mL 11   Insulin Pen Needle (B-D ULTRAFINE III SHORT PEN) 31G X 8 MM MISC USE TO INJECT INSULIN DAILY DX E11.9 100 each 3   isosorbide dinitrate (ISORDIL) 5 MG tablet Take 1 tablet (5 mg total) by mouth 2 (two) times daily. 180 tablet 1   mirtazapine (REMERON) 7.5 MG tablet TAKE ONE TABLET BY MOUTH AT BEDTIME FOR DEPRESSION 90 tablet 0  OneTouch Delica Lancets 23N MISC Test blood sugars three times daily 100 each 11   primidone (MYSOLINE) 50 MG tablet TAKE ONE TABLET BY MOUTH AT BEDTIME 90 tablet 0   Semaglutide, 2 MG/DOSE, 8 MG/3ML SOPN Inject 2 mg as directed once a week. 9 mL 1   tamsulosin (FLOMAX) 0.4 MG CAPS capsule Take 0.4 mg by mouth daily.     topiramate (TOPAMAX) 50 MG tablet TAKE ONE TABLET BY MOUTH AT BEDTIME AS NEEDED 90 tablet 0   No current facility-administered medications for this visit.    Allergies:   Patient has no known allergies.   Social History: Social History   Socioeconomic History   Marital status: Married    Spouse name: [name]   Number of children: 3   Years of education: Not on file   Highest education level: Not on file  Occupational History   Occupation: Agricultural consultant for [employer]    Employer: [employer]   Tobacco Use   Smoking status: Former    Packs/day: 2.00    Years: 22.00    Pack years: 44.00    Types: Cigarettes    Quit date: 10/23/2011    Years since quitting: 9.8   Smokeless  tobacco: Current    Types: Snuff  Vaping Use   Vaping Use: Never used  Substance and Sexual Activity   Alcohol use: No   Drug use: No   Sexual activity: Not on file  Other Topics Concern   Not on file  Social History Narrative   Pt lives with family in [place], [place].   Social Determinants of Health   Financial Resource Strain: Low Risk    Difficulty of Paying Living Expenses: Not hard at all  Food Insecurity: No Food Insecurity   Worried About Charity fundraiser in the Last Year: Never true   [place] in the Last Year: Never true  Transportation Needs: No Transportation Needs   Lack of Transportation (Medical): No   Lack of Transportation (Non-Medical): No  Physical Activity: Sufficiently Active   Days of Exercise per Week: 5 days   Minutes of Exercise per Session: 30 min  Stress: No Stress Concern Present   Feeling of Stress : Not at all  Social Connections: Moderately Integrated   Frequency of Communication with Friends and Family: More than three times a week   Frequency of Social Gatherings with Friends and Family: More than three times a week   Attends Religious Services: 1 to 4 times per year   Active Member of [employer] or Organizations: No   Attends Music therapist: Never   Marital Status: Married  Human resources officer Violence: Not At Risk   Fear of Current or Ex-Partner: No   Emotionally Abused: No   Physically Abused: No   Sexually Abused: No    Family History: Family History  Problem Relation Age of Onset   Hypertension Mother    Diabetes Mother    Hypertension Maternal Grandfather    Heart attack Paternal Grandfather    Heart attack Father 49    Review of Systems: All other systems reviewed and are otherwise negative except as noted above.   Physical Exam: There were no vitals filed for this visit.   GEN- The patient is well appearing, alert and oriented x 3 today.   HEENT: normocephalic, atraumatic; sclera clear, conjunctiva pink;  hearing intact; oropharynx clear; neck supple, no JVP Lymph- no cervical lymphadenopathy Lungs- Clear to ausculation bilaterally, normal work of  breathing.  No wheezes, rales, rhonchi Heart- Regular rate and rhythm, no murmurs, rubs or gallops, PMI not laterally displaced GI- soft, non-tender, non-distended, bowel sounds present, no hepatosplenomegaly Extremities- no clubbing or cyanosis. No edema; DP/PT/radial pulses 2+ bilaterally MS- no significant deformity or atrophy Skin- warm and dry, no rash or lesion; ICD pocket well healed Psych- euthymic mood, full affect Neuro- strength and sensation are intact  ICD interrogation- reviewed in detail today,  See PACEART report  EKG:  EKG is ordered today. Personal review of EKG ordered today shows ***  Recent Labs: 08/02/2021: ALT 28; BUN 23; Creatinine, Ser 1.27; Hemoglobin 13.7; Platelets 188; Potassium 4.6; Sodium 137   Wt Readings from Last 3 Encounters:  08/02/21 189 lb 4 oz (85.8 kg)  07/24/21 194 lb (88 kg)  06/12/21 194 lb (88 kg)     Other studies Reviewed: Additional studies/ records that were reviewed today include: Previous EP office notes.   Assessment and Plan:  1.  Chronic systolic dysfunction/ICM s/p Medtronic dual chamber ICD  euvolemic today Stable on an appropriate medical regimen Normal ICD function See Pace Art report No changes today Echo 05/2021 LVEF remains 30-35% Continue coreg and imdur.  GDMT has been limited by hypotension and h/o hyperkalemia. Delene Loll previously stopped/held)  2. VT/VF arrest No further VT be device.   3. HLD Continue lipitor.   4. CAD s/p CABG Continue ASA, plavix, statin, BB and imdur  Current medicines are reviewed at length with the patient today.   =  Labs/ tests ordered today include: *** No orders of the defined types were placed in this encounter.    Disposition:   Follow up with {Blank single:19197::"Dr. Allred","Dr. Arlan Organ. Klein","Dr. Camnitz","Dr.  Lambert","EP APP"} in {Blank single:19197::"2 weeks","4 weeks","3 months","6 months","12 months","as usual post gen change"}    Signed, Annamaria Helling  08/28/2021 9:37 AM  [facility] [address] [place] [address] [phone] (office) [phone] (fax)

## 2021-08-29 ENCOUNTER — Encounter: Payer: Medicare PPO | Admitting: Student

## 2021-08-29 DIAGNOSIS — I255 Ischemic cardiomyopathy: Secondary | ICD-10-CM

## 2021-08-29 DIAGNOSIS — I5042 Chronic combined systolic (congestive) and diastolic (congestive) heart failure: Secondary | ICD-10-CM

## 2021-08-29 DIAGNOSIS — I2510 Atherosclerotic heart disease of native coronary artery without angina pectoris: Secondary | ICD-10-CM

## 2021-08-29 DIAGNOSIS — Z95810 Presence of automatic (implantable) cardiac defibrillator: Secondary | ICD-10-CM

## 2021-08-29 NOTE — Chronic Care Management (AMB) (Signed)
?  Care Management  ? ?Outreach Note ? ?08/29/2021 ?Name: Douglas Carr MRN: [id number] DOB: 09-24-1965 ? ?Referred by: [name], [name], MD ?Reason for referral : Care Coordination (Outreach to schedule referral with [facility] ) ? ? ?A second unsuccessful telephone outreach was attempted today. The patient was referred to the case management team for assistance with care management and care coordination.  ? ?Follow Up Plan:  ?The care management team will reach out to the patient again over the next 7 days.  ?If patient returns call to provider office, please advise to call Embedded Care Management Care Guide Penne Lash * at [phone]* ? ?Penne Lash, RMA ?Care Guide, Embedded Care Coordination ?[facility]  Care Management  ?[place], [place] [address] ?Direct Dial: [phone] ?[email or web address] ?Website: [email or web address]  ? ?

## 2021-08-30 ENCOUNTER — Ambulatory Visit (HOSPITAL_COMMUNITY): Payer: Medicare PPO

## 2021-08-30 ENCOUNTER — Ambulatory Visit: Payer: Medicare PPO | Admitting: Vascular Surgery

## 2021-09-05 ENCOUNTER — Telehealth: Payer: Self-pay

## 2021-09-05 NOTE — Telephone Encounter (Signed)
LMOVM for patient to send missed ICM transmission. 

## 2021-09-06 NOTE — Chronic Care Management (AMB) (Signed)
?  Chronic Care Management  ? ?Outreach Note ? ?09/06/2021 ?Name: Douglas Carr MRN: [id number] DOB: 05/04/1966 ? ?Douglas Carr is a 56 y.o. year old male who is a primary care patient of [name], [name], MD. I reached out to [name] by phone today in response to a referral sent by Douglas Carr's primary care provider. ? ?Third unsuccessful telephone outreach was attempted today. The patient was referred to the case management team for assistance with care management and care coordination. The patient's primary care provider has been notified of our unsuccessful attempts to make or maintain contact with the patient. The care management team is pleased to engage with this patient at any time in the future should he/she be interested in assistance from the care management team.  ? ?Follow Up Plan: We have been unable to make contact with the patient for follow up. The care management team is available to follow up with the patient after provider conversation with the patient regarding recommendation for care management engagement and subsequent re-referral to the care management team.  ? ?Penne Lash, RMA ?Care Guide, Embedded Care Coordination ?[facility]  Care Management  ?[place], [place] [address] ?Direct Dial: [phone] ?[email or web address] ?Website: [email or web address]  ? ?

## 2021-09-10 ENCOUNTER — Telehealth: Payer: Self-pay | Admitting: *Deleted

## 2021-09-10 NOTE — Telephone Encounter (Signed)
(  Key: [id number]) ?Rx #: [id number] ?Ozempic (2 MG/DOSE) 8MG /3ML pen-injectors ? ?  ?Form ?[employer] Electronic PA Form ? ?SENT TO PLAN ?

## 2021-09-10 NOTE — Progress Notes (Signed)
No ICM remote transmission received for 09/02/2021 and next ICM transmission scheduled for 09/26/2021.   ? ?

## 2021-09-11 MED ORDER — SEMAGLUTIDE (2 MG/DOSE) 8 MG/3ML ~~LOC~~ SOPN: 2.0000 mg | PEN_INJECTOR | SUBCUTANEOUS | 1 refills | Status: DC

## 2021-09-11 NOTE — Telephone Encounter (Signed)
Your request has been approved ?PA Case: [id number], Status: Approved, Coverage Starts on: 09/11/2021 12:12:44 AM, Coverage Ends on: 09/11/2021 12:12:44 AM. Questions? Contact [phone]. ? ?Pharmacy aware. ?

## 2021-09-26 NOTE — Progress Notes (Signed)
No ICM remote transmission received for 09/26/2021 and next ICM transmission scheduled for 10/09/2021.

## 2021-10-10 ENCOUNTER — Telehealth: Payer: Self-pay

## 2021-10-10 ENCOUNTER — Other Ambulatory Visit: Payer: Self-pay | Admitting: Cardiovascular Disease

## 2021-10-10 NOTE — Telephone Encounter (Signed)
Attempted call to wife per DPR.  Left message explaining scheduled automatic remote transmissions are not being received.  Left Medtronic support number and left ICM number for call back.  ICM remote transmission not received 6/7 and rescheduled for 6/26.

## 2021-10-11 ENCOUNTER — Other Ambulatory Visit: Payer: Self-pay | Admitting: Family Medicine

## 2021-10-11 ENCOUNTER — Other Ambulatory Visit: Payer: Self-pay | Admitting: Cardiovascular Disease

## 2021-10-11 DIAGNOSIS — E782 Mixed hyperlipidemia: Secondary | ICD-10-CM

## 2021-10-11 DIAGNOSIS — F419 Anxiety disorder, unspecified: Secondary | ICD-10-CM

## 2021-10-15 ENCOUNTER — Encounter: Payer: Self-pay | Admitting: *Deleted

## 2021-10-24 NOTE — Telephone Encounter (Signed)
OPENED IN ERROR

## 2021-10-31 ENCOUNTER — Telehealth: Payer: Self-pay

## 2021-10-31 NOTE — Telephone Encounter (Signed)
I spoke with the patient about missed ICM transmission. He explained to me that him and his wife has separated and he can not go back to the house right now. He will try to get his daughter to go get his heart monitor for him. I let him know if he can not retrieve the monitor I can order him a new monitor.

## 2021-11-01 ENCOUNTER — Encounter: Payer: Self-pay | Admitting: Family Medicine

## 2021-11-01 ENCOUNTER — Ambulatory Visit (INDEPENDENT_AMBULATORY_CARE_PROVIDER_SITE_OTHER): Payer: Medicare PPO | Admitting: Family Medicine

## 2021-11-01 VITALS — BP 172/82 | HR 108 | Temp 98.0°F | Ht 70.0 in | Wt 183.0 lb

## 2021-11-01 DIAGNOSIS — E119 Type 2 diabetes mellitus without complications: Secondary | ICD-10-CM | POA: Diagnosis not present

## 2021-11-01 DIAGNOSIS — F32A Depression, unspecified: Secondary | ICD-10-CM

## 2021-11-01 DIAGNOSIS — E118 Type 2 diabetes mellitus with unspecified complications: Secondary | ICD-10-CM | POA: Diagnosis not present

## 2021-11-01 DIAGNOSIS — I10 Essential (primary) hypertension: Secondary | ICD-10-CM

## 2021-11-01 DIAGNOSIS — E785 Hyperlipidemia, unspecified: Secondary | ICD-10-CM | POA: Diagnosis not present

## 2021-11-01 DIAGNOSIS — E669 Obesity, unspecified: Secondary | ICD-10-CM

## 2021-11-01 DIAGNOSIS — F419 Anxiety disorder, unspecified: Secondary | ICD-10-CM | POA: Diagnosis not present

## 2021-11-01 DIAGNOSIS — N183 Chronic kidney disease, stage 3 unspecified: Secondary | ICD-10-CM

## 2021-11-01 DIAGNOSIS — E1169 Type 2 diabetes mellitus with other specified complication: Secondary | ICD-10-CM

## 2021-11-01 DIAGNOSIS — Z794 Long term (current) use of insulin: Secondary | ICD-10-CM

## 2021-11-01 LAB — BAYER DCA HB A1C WAIVED: HB A1C (BAYER DCA - WAIVED): 7.7 % — ABNORMAL HIGH (ref 4.8–5.6)

## 2021-11-01 MED ORDER — SEMAGLUTIDE (2 MG/DOSE) 8 MG/3ML ~~LOC~~ SOPN: 2.0000 mg | PEN_INJECTOR | SUBCUTANEOUS | 1 refills | Status: DC

## 2021-11-01 MED ORDER — CARIPRAZINE HCL 1.5 MG PO CAPS: 1.5000 mg | ORAL_CAPSULE | Freq: Every day | ORAL | 5 refills | Status: DC
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
## 2021-11-01 NOTE — Progress Notes (Signed)
No ICM remote transmission received for 10/28/2021 and next ICM transmission scheduled for 11/18/2021.   

## 2021-11-01 NOTE — Addendum Note (Signed)
Addended by: Dorene Sorrow on: 11/01/2021 02:17 PM   Modules accepted: Orders

## 2021-11-02 LAB — CMP14+EGFR
ALT: 18 IU/L (ref 0–44)
AST: 14 IU/L (ref 0–40)
Albumin/Globulin Ratio: 1.7 (ref 1.2–2.2)
Albumin: 4 g/dL (ref 3.8–4.9)
Alkaline Phosphatase: 110 IU/L (ref 44–121)
BUN/Creatinine Ratio: 15 (ref 9–20)
BUN: 21 mg/dL (ref 6–24)
Bilirubin Total: 0.4 mg/dL (ref 0.0–1.2)
CO2: 24 mmol/L (ref 20–29)
Calcium: 9.4 mg/dL (ref 8.7–10.2)
Chloride: 100 mmol/L (ref 96–106)
Creatinine, Ser: 1.37 mg/dL — ABNORMAL HIGH (ref 0.76–1.27)
Globulin, Total: 2.3 g/dL (ref 1.5–4.5)
Glucose: 337 mg/dL — ABNORMAL HIGH (ref 70–99)
Potassium: 5.4 mmol/L — ABNORMAL HIGH (ref 3.5–5.2)
Sodium: 136 mmol/L (ref 134–144)
Total Protein: 6.3 g/dL (ref 6.0–8.5)
eGFR: 61 mL/min/{1.73_m2} (ref >59)

## 2021-11-02 LAB — CBC WITH DIFFERENTIAL/PLATELET
Basophils Absolute: 0 10*3/uL (ref 0.0–0.2)
Basos: 0 %
EOS (ABSOLUTE): 0.2 10*3/uL (ref 0.0–0.4)
Eos: 2 %
Hematocrit: 46.3 % (ref 37.5–51.0)
Hemoglobin: 15.2 g/dL (ref 13.0–17.7)
Immature Grans (Abs): 0 10*3/uL (ref 0.0–0.1)
Immature Granulocytes: 0 %
Lymphocytes Absolute: 2.1 10*3/uL (ref 0.7–3.1)
Lymphs: 24 %
MCH: 27.7 pg (ref 26.6–33.0)
MCHC: 32.8 g/dL (ref 31.5–35.7)
MCV: 85 fL (ref 79–97)
Monocytes Absolute: 0.7 10*3/uL (ref 0.1–0.9)
Monocytes: 7 %
Neutrophils Absolute: 5.9 10*3/uL (ref 1.4–7.0)
Neutrophils: 67 %
Platelets: 165 10*3/uL (ref 150–450)
RBC: 5.48 x10E6/uL (ref 4.14–5.80)
RDW: 12.6 % (ref 11.6–15.4)
WBC: 8.9 10*3/uL (ref 3.4–10.8)

## 2021-11-02 LAB — LIPID PANEL
Chol/HDL Ratio: 3.1 ratio (ref 0.0–5.0)
Cholesterol, Total: 126 mg/dL (ref 100–199)
HDL: 41 mg/dL (ref >39)
LDL Chol Calc (NIH): 65 mg/dL (ref 0–99)
Triglycerides: 109 mg/dL (ref 0–149)
VLDL Cholesterol Cal: 20 mg/dL (ref 5–40)

## 2021-11-07 ENCOUNTER — Telehealth: Payer: Self-pay | Admitting: Family Medicine

## 2021-11-07 MED ORDER — CARIPRAZINE HCL 1.5 MG PO CAPS: 1.5000 mg | ORAL_CAPSULE | Freq: Every day | ORAL | 5 refills | Status: DC

## 2021-11-07 NOTE — Telephone Encounter (Signed)
Attempted to contact patient, NA  

## 2021-11-08 NOTE — Telephone Encounter (Signed)
Patient aware that medication has been sent to [facility].

## 2021-11-11 ENCOUNTER — Telehealth: Payer: Self-pay | Admitting: Cardiovascular Disease

## 2021-11-11 NOTE — Telephone Encounter (Signed)
*  STAT* If patient is at the pharmacy, call can be transferred to refill team.   1. Which medications need to be refilled? (please list name of each medication and dose if known) ENTRESTO 24-26 MG  2. Which pharmacy/location (including street and city if local pharmacy) is medication to be sent to? [facility] - [place], [name] - [address]  3. Do they need a 30 day or 90 day supply? 30 day supply   Pharmacy is requesting a callback if medication refill is not being sent.

## 2021-11-14 ENCOUNTER — Telehealth: Payer: Self-pay | Admitting: *Deleted

## 2021-11-14 NOTE — Telephone Encounter (Signed)
Tried to submit PA for Ozempic but eligibility with insurance couldn't be verified. TTC pt again as I called him earlier about his vraylar but no answer.

## 2021-11-14 NOTE — Telephone Encounter (Signed)
I tired to submit a Prior Auth but error message on CoverMyMeds can't verify insurance eligibility. I spoke to pt and he says he hasn't changed his insurance and it should be right but he also states that the pharmacy called and said his Leafy Kindle is ready to be picked up so I will archive the PA and close this encounter.

## 2021-11-15 MED ORDER — ENTRESTO 24-26 MG PO TABS: 1.0000 | ORAL_TABLET | Freq: Two times a day (BID) | ORAL | 1 refills | Status: DC

## 2021-11-15 NOTE — Addendum Note (Signed)
Addended by: Bernita Buffy on: 11/15/2021 05:02 PM   Modules accepted: Orders

## 2021-11-18 ENCOUNTER — Ambulatory Visit (INDEPENDENT_AMBULATORY_CARE_PROVIDER_SITE_OTHER): Payer: Medicare PPO

## 2021-11-18 DIAGNOSIS — I5042 Chronic combined systolic (congestive) and diastolic (congestive) heart failure: Secondary | ICD-10-CM

## 2021-11-18 DIAGNOSIS — Z95810 Presence of automatic (implantable) cardiac defibrillator: Secondary | ICD-10-CM

## 2021-11-18 NOTE — Telephone Encounter (Signed)
PA has been approved - attempted to call pt , no answer left vm for cb

## 2021-11-22 ENCOUNTER — Telehealth: Payer: Self-pay

## 2021-11-22 NOTE — Telephone Encounter (Signed)
Remote ICM transmission received.  Attempted call to wife/patient regarding ICM remote transmission and voice mail not set up.

## 2021-11-22 NOTE — Progress Notes (Signed)
EPIC Encounter for ICM Monitoring  Patient Name: Douglas Carr is a 56 y.o. male Date: 11/22/2021 Primary Care Physican: [name], [name], MD Primary Cardiologist: Allyson Sabal Electrophysiologist: Elberta Fortis 07/31/2021 Weight: 194 lbs                                                          Attempted call to patient and unable to reach. Transmission reviewed.    Optivol thoracic impedance normal.    Prescribed: No diuretic   Labs: 05/28/2021 Creatinine 1.30, BUN 27, Potassium 4.2, Sodium 138, GFR >60 05/27/2021 Creatinine 1.36, BUN 39, Potassium 4.6, Sodium 135, GFR >60  05/26/2021 Creatinine 1.54, BUN 41, Potassium 6.3, Sodium 141, GFR 53  A complete set of results can be found in Results Review.   Recommendations:  Unable to reach.     Follow-up plan: ICM clinic phone appointment on 12/23/2021.   91 day device clinic remote transmission 01/07/2022.     EP/Cardiology Office Visits: Message sent to EP scheduler 11/22/2021 to contact patient to set up appointment.  Last EP visit was with Dr Elberta Fortis was 2018.   Copy of ICM check sent to Dr. Elberta Fortis.     3 month ICM trend: 11/18/2021.    12-14 Month ICM trend:     Karie Soda, RN 11/22/2021 1:22 PM

## 2021-12-04 ENCOUNTER — Telehealth: Payer: Self-pay

## 2021-12-04 NOTE — Telephone Encounter (Signed)
After re-submitting again I received another message stating patient was not covered.  At this point, I called insurance company and verified patient was covered and spoke with someone there to obtain prior authorization.  Case number [id number], approved for Vraylar through 05/04/22.

## 2021-12-04 NOTE — Telephone Encounter (Signed)
Resubmitted prior authorization for Vraylar after speaking with [facility].  They do require a PA for this medication and they have not contacted the patient about picking up this medication.  The problem with cover my meds not recognizing this patient has [facility] was that Dr. Darrol Poke NPI number was being entered incorrectly.  When I entered the correct NPI number and resubmitted to cover my meds, this went through with no problems.  Douglas Carr (Key: [id number]) Vraylar 1.5MG  capsules   Form [employer] Electronic PA Form Created 3 hours ago Sent to Plan less than a minute ago

## 2021-12-05 ENCOUNTER — Other Ambulatory Visit: Payer: Self-pay | Admitting: Cardiovascular Disease

## 2021-12-05 ENCOUNTER — Other Ambulatory Visit: Payer: Self-pay | Admitting: Family Medicine

## 2021-12-05 DIAGNOSIS — I5042 Chronic combined systolic (congestive) and diastolic (congestive) heart failure: Secondary | ICD-10-CM

## 2021-12-06 NOTE — Progress Notes (Deleted)
Cardiology Office Note Date:  12/06/2021  Patient ID:  Douglas, Carr [birth year], MRN [id number] PCP:  [name], Fransisca Kaufmann, MD  Cardiologist:  *** Electrophysiologist: Dr. Curt Carr  ***refresh   Chief Complaint: *** lost to EP follow up  History of Present Illness: Douglas Carr is a 56 y.o. male with history of CAD (STEMI Jan 2018, PCI to OM . CABG 2021), VF arrest, HTN, HLD, DM, PVD (right CEA, 2010), chronic CHF (systolic), ICM   January 0973 had STEMI with PCI to the OM 2 on 05/11/16.  He then re-presented to the hospital with cardiac arrest and ventricular fibrillation when he was found pulseless by his family. CPR was initiated within 1 minute. He had an AED shock in the field. He was taken back to the cath lab and was found to have an edge dissection with thrombus in the circumflex treated with overlapping drug-eluting stents. He was placed on the cooling protocol. It was thought at the time that his arrest was caused by VT VF and not a myocardial infarction. Due to that, it was thought that a defibrillator would be indicated. His hospital course was complicated by anoxic encephalopathy. It was thought that he had anoxic brain injury from a cardiac arrest causing a left thalamic stroke.  He last saw Dr. Curt Carr back in 2018 post implant visit.  He has followed with gen cards team.  Most recently during a hospitalization in Jan 2023 for syncope.  ICD noted no arrhythmias, felt to be orthostatic in etiology and meds adjusted  His last visit with IM 11/01/21, noting poor medication compliance, under a lot of personal stressors of late with familial and legal issues  + remotes  *** symptoms *** volume *** meds, CAD, CM *** orthostatic *** needs gen cards f/u   Device information MDT single chamber ICD implanted 06/30/2016   Past Medical History:  Diagnosis Date   Acute ST elevation myocardial infarction (STEMI) involving left circumflex coronary artery ([place])  05/07/2016   PCI to Cx-OM   Anginal pain ([place])    secondary to sm. vessel disease   Anxiety    Arthritis    BIL KNEE PAIN AND BIL ANKLE PAIN   Bone spur of ankle    Cardiac arrest ([place]) 05/24/2016   with v fib   Chronic combined systolic and diastolic CHF, NYHA class 2 and ACA/AHA stage C 05/13/2016   Coronary artery disease involving native coronary artery of native heart with angina pectoris ([place]) 05/24/2016   Remote MI at 56 years of age, Last cath 2010-diffuse non-obstructive disease; last echo 06/16/08 -normal LV function, moderate concentric hypertrophy; nuc 08/2008 no ischemia;  medical therapy; STEMI May 07 2016 - PCI to Cx-OM   Diabetes mellitus    ON ORAL MEDICATION AND INSULIN   Dilated cardiomyopathy ([place]) 05/24/2016   EF 325-30% by Echo post STEMI (previously 30-35%)    Hyperlipidemia    Hypertension    Myocardial infarction ([place]) 1996   Post MI   Peripheral vascular disease ([place])    HAS LEFT CAROTID ARTERY STENOSIS   AND IS S/P RIGHT CAROTID ENDARTERECTOMY 2010 Last carotid dopplers 01/08/2012 wth patent endarterectomy site   Status post coronary artery stent placement     Past Surgical History:  Procedure Laterality Date   APPENDECTOMY     CARDIAC CATHETERIZATION     FEB 2010, significant branch vessel disease wth diag, marginal, PDA & PLA, nml. LV function   CARDIAC CATHETERIZATION N/A 05/07/2016  Procedure: Left Heart Cath and Coronary Angiography;  Surgeon: Peter M Martinique, MD;  Location: [facility];  Service: Cardiovascular;  Laterality: N/A;   CARDIAC CATHETERIZATION N/A 05/07/2016   Procedure: Coronary Stent Intervention;  Surgeon: Peter M Martinique, MD;  Location: [facility];  Service: Cardiovascular;  Laterality: N/A;   CARDIAC CATHETERIZATION N/A 05/24/2016   Procedure: Left Heart Cath and Coronary Angiography;  Surgeon: [name], MD;  Location: [facility];  Service: Cardiovascular;  Laterality: N/A;   CARDIAC CATHETERIZATION N/A 05/24/2016    Procedure: Coronary Stent Intervention;  Surgeon: [name], MD;  Location: [facility];  Service: Cardiovascular;  Laterality: N/A;  2.5x20 Promus to Ostial/proximal circumflex   CAROTID ENDARTERECTOMY  09/2008   Rt CEA   CLIPPING OF ATRIAL APPENDAGE Left 01/27/2020   Procedure: CLIPPING OF ATRIAL APPENDAGE USING ATRICURE 6 MM ATRICLIP;  Surgeon: Wonda Olds, MD;  Location: [facility];  Service: Open Heart Surgery;  Laterality: Left;   CORONARY ARTERY BYPASS GRAFT N/A 01/27/2020   Procedure: CORONARY ARTERY BYPASS GRAFTING (CABG), ON PUMP, TIMES FIVE, USING BILATERAL MAMMARY ARTERIES, LEFT RADIAL ARTERY, AND ENDOSCOPICALLY HARVESTED RIGHT GREATER SAPHENOUS VEIN.;  Surgeon: Wonda Olds, MD;  Location: [facility];  Service: Open Heart Surgery;  Laterality: N/A;  LIMA->LAD RIMA->D1 (free graft) LRA-> OM SVG-> PDA->PL   ENDOVEIN HARVEST OF GREATER SAPHENOUS VEIN Right 01/27/2020   Procedure: ENDOVEIN HARVEST OF GREATER SAPHENOUS VEIN;  Surgeon: Wonda Olds, MD;  Location: [facility];  Service: Open Heart Surgery;  Laterality: Right;   ICD IMPLANT N/A 06/30/2016   Procedure: ICD Implant;  Surgeon: Will Meredith Leeds, MD;  Location: [facility];  Service: Cardiovascular;  Laterality: N/A;   INTRAVASCULAR PRESSURE WIRE/FFR STUDY N/A 01/24/2020   Procedure: INTRAVASCULAR PRESSURE WIRE/FFR STUDY;  Surgeon: Belva Crome, MD;  Location: [facility];  Service: Cardiovascular;  Laterality: N/A;   LUNG BIOPSY  2013   Bx's suggest granulomatous dz.   RADIAL ARTERY HARVEST Left 01/27/2020   Procedure: RADIAL ARTERY HARVEST;  Surgeon: Wonda Olds, MD;  Location: [facility];  Service: Open Heart Surgery;  Laterality: Left;   RIGHT/LEFT HEART CATH AND CORONARY ANGIOGRAPHY N/A 01/24/2020   Procedure: RIGHT/LEFT HEART CATH AND CORONARY ANGIOGRAPHY;  Surgeon: Belva Crome, MD;  Location: [facility];  Service: Cardiovascular;  Laterality: N/A;   RT ANKLE   2013   TEE WITHOUT  CARDIOVERSION N/A 01/27/2020   Procedure: TRANSESOPHAGEAL ECHOCARDIOGRAM (TEE);  Surgeon: Wonda Olds, MD;  Location: [facility];  Service: Open Heart Surgery;  Laterality: N/A;    Current Outpatient Medications  Medication Sig Dispense Refill   albuterol (VENTOLIN HFA) 108 (90 Base) MCG/ACT inhaler Inhale 2 puffs into the lungs every 6 (six) hours as needed for wheezing or shortness of breath. 8 g 2   ASPIRIN LOW DOSE 81 MG tablet TAKE ONE TABLET BY MOUTH DAILY 90 tablet 0   atorvastatin (LIPITOR) 80 MG tablet TAKE ONE TABLET BY MOUTH IN THE EVENING 90 tablet 0   blood glucose meter kit and supplies KIT Inject 1 each into the skin 4 (four) times daily as needed. Dispense based on patient and insurance preference. Use up to four times daily as directed. (FOR ICD-9 250.00, 250.01). 1 each 0   cariprazine (VRAYLAR) 1.5 MG capsule Take 1 capsule (1.5 mg total) by mouth daily. 30 capsule 5   carvedilol (COREG) 3.125 MG tablet Take 1  tablet (3.125 mg total) by mouth every 12 (twelve) hours. 60 tablet 1   carvedilol (COREG) 6.25 MG tablet TAKE ONE TABLET BY MOUTH TWICE DAILY WITH MEALS (KEEP APPOINTMENT FOR FUTURE REFILLS) 30 tablet 0   clopidogrel (PLAVIX) 75 MG tablet Take 1 tablet (75 mg total) by mouth every evening. 90 tablet 0   DULoxetine (CYMBALTA) 60 MG capsule Take 2 capsules (120 mg total) by mouth daily. 180 capsule 0   ENTRESTO 24-26 MG Take 1 tablet by mouth 2 (two) times daily. 60 tablet 1   furosemide (LASIX) 40 MG tablet TAKE ONE TABLET BY MOUTH DAILY AS NEEDED FOR WEIGHT GAIN THREE LBS IN ONE DAY OR FIVE LBS IN ONE WEEK OR FOR SWELLING 30 tablet 0   glucose blood (ONETOUCH VERIO) test strip 1 each by Other route in the morning, at noon, in the evening, and at bedtime. use for testing 120 each 11   hydrOXYzine (ATARAX) 25 MG tablet Take 1 tablet (25 mg total) by mouth 3 (three) times daily as needed. 90 tablet 2   Insulin Pen Needle (B-D ULTRAFINE III SHORT PEN) 31G X 8 MM MISC USE TO  INJECT INSULIN DAILY DX E11.9 100 each 3   isosorbide dinitrate (ISORDIL) 5 MG tablet TAKE ONE TABLET BY MOUTH TWICE DAILY 180 tablet 0   mirtazapine (REMERON) 7.5 MG tablet TAKE ONE TABLET BY MOUTH AT BEDTIME FOR DEPRESSION 90 tablet 0   OneTouch Delica Lancets 16W MISC Test blood sugars three times daily 100 each 11   primidone (MYSOLINE) 50 MG tablet TAKE ONE TABLET BY MOUTH AT BEDTIME 90 tablet 0   Semaglutide, 2 MG/DOSE, 8 MG/3ML SOPN Inject 2 mg as directed once a week. PA approved til 09/12/22 9 mL 1   tamsulosin (FLOMAX) 0.4 MG CAPS capsule Take 0.4 mg by mouth daily.     topiramate (TOPAMAX) 50 MG tablet TAKE ONE TABLET BY MOUTH AT BEDTIME AS NEEDED 90 tablet 0   No current facility-administered medications for this visit.    Allergies:   Patient has no known allergies.   Social History:  The patient  reports that he quit smoking about 10 years ago. His smoking use included cigarettes. He has a 44.00 pack-year smoking history. His smokeless tobacco use includes snuff. He reports that he does not drink alcohol and does not use drugs.   Family History:  The patient's family history includes Diabetes in his mother; Heart attack in his paternal grandfather; Heart attack (age of onset: 66) in his father; Hypertension in his maternal grandfather and mother.  ROS:  Please see the history of present illness.    All other systems are reviewed and otherwise negative.   PHYSICAL EXAM:  VS:  There were no vitals taken for this visit. BMI: There is no height or weight on file to calculate BMI. Well nourished, well developed, in no acute distress HEENT: normocephalic, atraumatic Neck: no JVD, carotid bruits or masses Cardiac:  *** RRR; no significant murmurs, no rubs, or gallops Lungs:  *** CTA b/l, no wheezing, rhonchi or rales Abd: soft, nontender MS: no deformity or *** atrophy Ext: *** no edema Skin: warm and dry, no rash Neuro:  No gross deficits appreciated Psych: euthymic mood,  full affect  *** ICD site is stable, no tethering or discomfort   EKG:  not done today  Device interrogation done today and reviewed by myself:  ***  05/27/2021: TTE  1. Global hypokinesis worse in the inferior base . Left  ventricular  ejection fraction, by estimation, is 30 to 35%. The left ventricle has  moderately decreased function. The left ventricle has no regional wall  motion abnormalities. The left ventricular  internal cavity size was moderately dilated. Left ventricular diastolic  parameters were normal.   2. Right ventricular systolic function is normal. The right ventricular  size is normal.   3. Left atrial size was mildly dilated.   4. The mitral valve is abnormal. No evidence of mitral valve  regurgitation. No evidence of mitral stenosis.   5. The aortic valve is tricuspid. There is mild calcification of the  aortic valve. Aortic valve regurgitation is not visualized. Aortic valve  sclerosis is present, with no evidence of aortic valve stenosis.   6. The inferior vena cava is normal in size with greater than 50%  respiratory variability, suggesting right atrial pressure of 3 mmHg.    01/24/2020: LHC Severe 3 vessel CAD Patent LM Proximal to mid LAD 60-70% with evidence of hemodynamic significance, DFR = 0.7. Ostial to mid circumflex diffuse ISR 99%. Ostial 95% RCA. LVEDP 33 mmHg, consistent with acute on chronic combined systolic and diastolic HF. Moderate pulmonary hypertension with mean PAP 40 mmHg. WHO Group II  (PVR=2 [place] units; PCWP mean 30 mmHg) Cardiac output 4.9 l/min PA saturation 57%   RECOMMENDATION:   Aggressive management CHF. May require inotrope and mechanical support. Once HF controlled, consider CABG.  Recent Labs: 11/01/2021: ALT 18; BUN 21; Creatinine, Ser 1.37; Hemoglobin 15.2; Platelets 165; Potassium 5.4; Sodium 136  11/01/2021: Chol/HDL Ratio 3.1; Cholesterol, Total 126; HDL 41; LDL Chol Calc (NIH) 65; Triglycerides 109   CrCl  cannot be calculated (Patient's most recent lab result is older than the maximum 21 days allowed.).   Wt Readings from Last 3 Encounters:  11/01/21 183 lb (83 kg)  08/02/21 189 lb 4 oz (85.8 kg)  07/24/21 194 lb (88 kg)     Other studies reviewed: Additional studies/records reviewed today include: summarized above  ASSESSMENT AND PLAN:  ICD ***  ICM Chronic CHF ***  CAD ***  HTN Orthostatic hypotension ***  Disposition: F/u with ***  Current medicines are reviewed at length with the patient today.  The patient did not have any concerns regarding medicines.  Venetia Night, PA-C 12/06/2021 2:55 PM     [address] [place] [address] [phone] (office)  [phone] (fax)

## 2021-12-09 ENCOUNTER — Encounter: Payer: Self-pay | Admitting: Family Medicine

## 2021-12-09 ENCOUNTER — Encounter: Payer: Medicare PPO | Admitting: Physician Assistant

## 2021-12-09 ENCOUNTER — Ambulatory Visit: Payer: Medicare PPO | Admitting: Family Medicine

## 2021-12-11 ENCOUNTER — Other Ambulatory Visit: Payer: Self-pay | Admitting: Family Medicine

## 2021-12-11 ENCOUNTER — Telehealth: Payer: Self-pay | Admitting: Family Medicine

## 2021-12-11 DIAGNOSIS — F419 Anxiety disorder, unspecified: Secondary | ICD-10-CM

## 2021-12-11 NOTE — Telephone Encounter (Signed)
Refills sent, via electronic request.

## 2021-12-11 NOTE — Telephone Encounter (Signed)
  Prescription Request  12/11/2021  Is this a "Controlled Substance" medicine? DULoxetine (CYMBALTA) 60 MG capsule and hydrOXYzine (ATARAX) 25 MG tablet  Have you seen your PCP in the last 2 weeks? No has apt on OCT  If YES, route message to pool  -  If NO, patient needs to be scheduled for appointment.  What is the name of the medication or equipment? ULoxetine (CYMBALTA) 60 MG capsule and hydrOXYzine (ATARAX) 25 MG tablet  Have you contacted your pharmacy to request a refill? Pharmacy called to request med refill   Which pharmacy would you like this sent to[facility]   Patient notified that their request is being sent to the clinical staff for review and that they should receive a response within 2 business days.

## 2021-12-11 NOTE — Telephone Encounter (Signed)
Refills sent, Dr. [name]/I refilled in April, Next OV 02/05/22

## 2021-12-26 ENCOUNTER — Telehealth: Payer: Self-pay

## 2021-12-26 NOTE — Telephone Encounter (Signed)
I called the patient about missed ICM transmission. No answer/ no voicemail.

## 2021-12-27 NOTE — Progress Notes (Signed)
No ICM remote transmission received for 12/23/2021 and next ICM transmission scheduled for 01/13/2022.   

## 2021-12-31 ENCOUNTER — Other Ambulatory Visit: Payer: Self-pay | Admitting: Cardiovascular Disease

## 2022-01-15 NOTE — Progress Notes (Signed)
No ICM remote transmission received for 01/13/2022 and next ICM transmission scheduled for 02/03/2022.   

## 2022-01-22 ENCOUNTER — Telehealth: Payer: Self-pay | Admitting: Family Medicine

## 2022-01-30 ENCOUNTER — Other Ambulatory Visit: Payer: Self-pay | Admitting: Family Medicine

## 2022-01-30 ENCOUNTER — Other Ambulatory Visit: Payer: Self-pay | Admitting: Cardiovascular Disease

## 2022-01-30 DIAGNOSIS — F419 Anxiety disorder, unspecified: Secondary | ICD-10-CM

## 2022-01-30 DIAGNOSIS — E782 Mixed hyperlipidemia: Secondary | ICD-10-CM

## 2022-02-03 ENCOUNTER — Telehealth: Payer: Self-pay

## 2022-02-03 NOTE — Telephone Encounter (Signed)
Attempted call to patient to request manual remote transmission and inform patient monitor is showing disconnected.  Recording stated voice mail has not been set up.  My chart message sent to patient regarding disconnected monitor.  ICM remote transmission rescheduled for 02/24/2022.

## 2022-02-05 ENCOUNTER — Ambulatory Visit: Payer: Medicare PPO | Admitting: Family Medicine

## 2022-02-05 ENCOUNTER — Encounter: Payer: Self-pay | Admitting: Family Medicine

## 2022-02-11 NOTE — Telephone Encounter (Signed)
Left message to call with current insurance card.

## 2022-02-17 ENCOUNTER — Other Ambulatory Visit: Payer: Self-pay | Admitting: Vascular Surgery

## 2022-02-17 DIAGNOSIS — I6523 Occlusion and stenosis of bilateral carotid arteries: Secondary | ICD-10-CM

## 2022-02-23 ENCOUNTER — Other Ambulatory Visit: Payer: Self-pay | Admitting: Family Medicine

## 2022-02-25 NOTE — Progress Notes (Signed)
No ICM remote transmission received for 02/24/2022 and next ICM transmission scheduled for 03/17/2022.   

## 2022-03-04 ENCOUNTER — Other Ambulatory Visit: Payer: Self-pay | Admitting: Cardiovascular Disease

## 2022-03-04 ENCOUNTER — Other Ambulatory Visit: Payer: Self-pay | Admitting: Family Medicine

## 2022-03-04 DIAGNOSIS — F32A Depression, unspecified: Secondary | ICD-10-CM

## 2022-03-04 DIAGNOSIS — I5042 Chronic combined systolic (congestive) and diastolic (congestive) heart failure: Secondary | ICD-10-CM

## 2022-03-20 ENCOUNTER — Telehealth: Payer: Self-pay

## 2022-03-20 NOTE — Telephone Encounter (Signed)
I called patient about missed ICM transmission no answer/ voicemail not set up.

## 2022-03-26 NOTE — Progress Notes (Signed)
No ICM remote transmission received for 03/17/2022 and next ICM transmission scheduled for 04/14/2022.   

## 2022-04-16 NOTE — Progress Notes (Signed)
Unable to reach patient for monthly ICM remote follow up and not receiving monthly remote transmission.  Patient disenrolled due patient is not actively participating in ICM clinic.  Device clinic 91 day remote monitoring will continue per protocol.   ?

## 2022-08-13 ENCOUNTER — Telehealth: Payer: Self-pay | Admitting: Family Medicine

## 2022-08-13 NOTE — Telephone Encounter (Signed)
Called patient to schedule Medicare Annual Wellness Visit (AWV). No voicemail available to leave a message.  Goes straight to VM -but VM not set up  Last date of AWV: 07/24/2021   Please schedule an appointment at any time with either Vernona Rieger or [place], NHA's. .  If any questions, please contact me at [phone].  Thank you,  Judeth Cornfield,  AMB Clinical Support [facility] AWV Program Direct Dial ??[phone]

## 2023-08-24 ENCOUNTER — Ambulatory Visit: Admitting: Family Medicine

## 2023-08-24 ENCOUNTER — Encounter: Payer: Self-pay | Admitting: Family Medicine

## 2023-08-26 ENCOUNTER — Telehealth: Payer: Self-pay | Admitting: Family Medicine

## 2023-08-26 NOTE — Telephone Encounter (Signed)
 Pt came in stating that he was told by automated system that his appt with Dr Steen Eden was today at 4:00 pm. Explained to pt that his appt was scheduled for this past Monday at 10:55am. Pt voiced understanding and was very polite. He asked if there was anyway he could be seen by Dr [name] sometime next week. Says he hasn't been seen in a while and would really like a check up. I told him I would send the nurse a message and ask her and will call to let him know.  Please advise.

## 2023-08-26 NOTE — Telephone Encounter (Signed)
 Offered pt this Friday but pt is unable. Needs next week due to transportation concerns. Pt is scheduled for 4/28 at 3:05pm. Instructed pt to arrive at 2:50 to check in.

## 2023-08-31 ENCOUNTER — Encounter: Payer: Self-pay | Admitting: Family Medicine

## 2023-08-31 ENCOUNTER — Ambulatory Visit (INDEPENDENT_AMBULATORY_CARE_PROVIDER_SITE_OTHER): Payer: Self-pay | Admitting: Family Medicine

## 2023-08-31 VITALS — BP 153/79 | HR 78 | Ht 70.0 in | Wt 200.0 lb

## 2023-08-31 DIAGNOSIS — I5042 Chronic combined systolic (congestive) and diastolic (congestive) heart failure: Secondary | ICD-10-CM

## 2023-08-31 DIAGNOSIS — F419 Anxiety disorder, unspecified: Secondary | ICD-10-CM

## 2023-08-31 DIAGNOSIS — F32A Depression, unspecified: Secondary | ICD-10-CM

## 2023-08-31 DIAGNOSIS — E118 Type 2 diabetes mellitus with unspecified complications: Secondary | ICD-10-CM

## 2023-08-31 DIAGNOSIS — E785 Hyperlipidemia, unspecified: Secondary | ICD-10-CM

## 2023-08-31 DIAGNOSIS — N183 Chronic kidney disease, stage 3 unspecified: Secondary | ICD-10-CM

## 2023-08-31 DIAGNOSIS — Z7985 Long-term (current) use of injectable non-insulin antidiabetic drugs: Secondary | ICD-10-CM

## 2023-08-31 DIAGNOSIS — I25119 Atherosclerotic heart disease of native coronary artery with unspecified angina pectoris: Secondary | ICD-10-CM

## 2023-08-31 DIAGNOSIS — I10 Essential (primary) hypertension: Secondary | ICD-10-CM

## 2023-08-31 DIAGNOSIS — E782 Mixed hyperlipidemia: Secondary | ICD-10-CM

## 2023-08-31 LAB — BAYER DCA HB A1C WAIVED: HB A1C (BAYER DCA - WAIVED): 9.1 % — ABNORMAL HIGH (ref 4.8–5.6)

## 2023-08-31 MED ORDER — LANTUS SOLOSTAR 100 UNIT/ML ~~LOC~~ SOPN: 11.0000 [IU] | PEN_INJECTOR | Freq: Every day | SUBCUTANEOUS | 1 refills | Status: DC

## 2023-08-31 MED ORDER — CARVEDILOL 3.125 MG PO TABS: 3.1250 mg | ORAL_TABLET | Freq: Two times a day (BID) | ORAL | 1 refills | Status: DC

## 2023-08-31 MED ORDER — ATORVASTATIN CALCIUM 80 MG PO TABS
80.0000 mg | ORAL_TABLET | Freq: Every evening | ORAL | 1 refills | Status: DC
Start: 2023-08-31 — End: 2023-12-24

## 2023-08-31 MED ORDER — CLOPIDOGREL BISULFATE 75 MG PO TABS: 75.0000 mg | ORAL_TABLET | Freq: Every evening | ORAL | 1 refills | Status: DC

## 2023-08-31 MED ORDER — MIRTAZAPINE 7.5 MG PO TABS: 7.5000 mg | ORAL_TABLET | Freq: Every day | ORAL | 1 refills | Status: DC

## 2023-08-31 MED ORDER — ISOSORBIDE DINITRATE 5 MG PO TABS
5.0000 mg | ORAL_TABLET | Freq: Two times a day (BID) | ORAL | 1 refills | Status: DC
Start: 2023-08-31 — End: 2023-12-24

## 2023-08-31 MED ORDER — ASPIRIN 81 MG PO TBEC: 81.0000 mg | DELAYED_RELEASE_TABLET | Freq: Every day | ORAL | 1 refills | Status: AC

## 2023-08-31 MED ORDER — AMLODIPINE BESYLATE 10 MG PO TABS: 10.0000 mg | ORAL_TABLET | Freq: Every day | ORAL | 1 refills | Status: DC

## 2023-08-31 MED ORDER — TAMSULOSIN HCL 0.4 MG PO CAPS: 0.4000 mg | ORAL_CAPSULE | Freq: Every day | ORAL | 1 refills | Status: DC

## 2023-08-31 NOTE — Progress Notes (Signed)
 BP (!) 153/79   Pulse 78   Ht 5\' 10"  (1.778 m)   Wt 200 lb (90.7 kg)   SpO2 97%   BMI 28.70 kg/m    Subjective:   Patient ID: Douglas Carr, male    DOB: 07/23/1965, 58 y.o.   MRN: [id number]  HPI: Douglas Carr is a 58 y.o. male presenting on 08/31/2023 for Medical Management of Chronic Issues, Hypertension, and Diabetes   HPI Patient is just getting out of jail and was incarcerated for the past year and a half and got out 5 days ago.  He is trying to get back on all of his medicines.  He does not currently have insurance but is working on getting some.  Type 2 diabetes mellitus Patient comes in today for recheck of his diabetes. Patient has been currently taking lantus  11 units. Patient is not currently on an ACE inhibitor/ARB. Patient has not seen an ophthalmologist this year. Patient denies any new issues with their feet. The symptom started onset as an adult hypertension and CHF and CAD and hyperlipidemia and PAD ARE RELATED TO DM   Hypertension and CHF and CAD Patient is currently on amlodipine and carvedilol  and Imdur , and their blood pressure today is 153/79. Patient denies any lightheadedness or dizziness. Patient denies headaches, blurred vision, chest pains, shortness of breath, or weakness. Denies any side effects from medication and is content with current medication.   Hyperlipidemia and CAD and history of anoxic brain injury and PVD and carotid artery disease Patient is coming in for recheck of his hyperlipidemia. The patient is currently taking atorvastatin . They deny any issues with myalgias or history of liver damage from it. They deny any focal numbness or weakness or chest pain.   Relevant past medical, surgical, family and social history reviewed and updated as indicated. Interim medical history since our last visit reviewed. Allergies and medications reviewed and updated.  Review of Systems  Constitutional:  Negative for chills and fever.  Eyes:   Negative for visual disturbance.  Respiratory:  Negative for shortness of breath and wheezing.   Cardiovascular:  Negative for chest pain and leg swelling.  Musculoskeletal:  Negative for back pain and gait problem.  Skin:  Negative for rash.  Neurological:  Negative for dizziness, weakness and light-headedness.  All other systems reviewed and are negative.   Per HPI unless specifically indicated above   Allergies as of 08/31/2023   No Known Allergies      Medication List        Accurate as of August 31, 2023  4:01 PM. If you have any questions, ask your nurse or doctor.          STOP taking these medications    albuterol  108 (90 Base) MCG/ACT inhaler Commonly known as: VENTOLIN  HFA Stopped by: Lucio Sabin Kalyna Paolella   cariprazine  1.5 MG capsule Commonly known as: Vraylar  Stopped by: Lucio Sabin Taliya Mcclard   DULoxetine  60 MG capsule Commonly known as: CYMBALTA  Stopped by: Lucio Sabin Luba Matzen   Entresto  24-26 MG Generic drug: sacubitril -valsartan  Stopped by: Lucio Sabin Gianni Fuchs   furosemide  40 MG tablet Commonly known as: LASIX  Stopped by: Lucio Sabin Khiley Lieser   hydrOXYzine  25 MG tablet Commonly known as: ATARAX  Stopped by: Lucio Sabin Glennie Rodda   Ozempic  (2 MG/DOSE) 8 MG/3ML Sopn Generic drug: Semaglutide  (2 MG/DOSE) Stopped by: Lucio Sabin Dorothie Wah   primidone  50 MG tablet Commonly known as: MYSOLINE  Stopped by: Lucio Sabin Katherine Tout   topiramate  50 MG tablet  Commonly known as: TOPAMAX  Stopped by: Lucio Sabin Rishita Petron       TAKE these medications    amLODipine 10 MG tablet Commonly known as: NORVASC Take 1 tablet (10 mg total) by mouth daily. Started by: Lucio Sabin Sebrena Engh   aspirin  EC 81 MG tablet Commonly known as: Aspirin  Low Dose Take 1 tablet (81 mg total) by mouth daily. Swallow whole. What changed:  how much to take additional instructions Changed by: Lucio Sabin Lundon Verdejo   atorvastatin  80 MG tablet Commonly known as: LIPITOR  Take 1 tablet (80 mg  total) by mouth every evening.   B-D ULTRAFINE III SHORT PEN 31G X 8 MM Misc Generic drug: Insulin  Pen Needle USE TO INJECT INSULIN  DAILY DX E11.9   blood glucose meter kit and supplies Kit Inject 1 each into the skin 4 (four) times daily as needed. Dispense based on patient and insurance preference. Use up to four times daily as directed. (FOR ICD-9 250.00, 250.01).   carvedilol  3.125 MG tablet Commonly known as: COREG  Take 1 tablet (3.125 mg total) by mouth every 12 (twelve) hours. What changed: Another medication with the same name was removed. Continue taking this medication, and follow the directions you see here. Changed by: Lucio Sabin Temperance Kelemen   clopidogrel  75 MG tablet Commonly known as: PLAVIX  Take 1 tablet (75 mg total) by mouth every evening.   isosorbide  dinitrate 5 MG tablet Commonly known as: ISORDIL  Take 1 tablet (5 mg total) by mouth 2 (two) times daily. Please contact the office to schedule appointment for additional refills. 1st Attempt.   Lantus  SoloStar 100 UNIT/ML Solostar Pen Generic drug: insulin  glargine Inject 11 Units into the skin at bedtime. Started by: Lucio Sabin Anel Creighton   mirtazapine  7.5 MG tablet Commonly known as: REMERON  Take 1 tablet (7.5 mg total) by mouth at bedtime. What changed: See the new instructions. Changed by: Lucio Sabin Jacalynn Buzzell   OneTouch Delica Lancets 33G Misc Test blood sugars three times daily   OneTouch Verio test strip Generic drug: glucose blood 1 each by Other route in the morning, at noon, in the evening, and at bedtime. use for testing   tamsulosin  0.4 MG Caps capsule Commonly known as: FLOMAX  Take 1 capsule (0.4 mg total) by mouth daily.         Objective:   BP (!) 153/79   Pulse 78   Ht 5\' 10"  (1.778 m)   Wt 200 lb (90.7 kg)   SpO2 97%   BMI 28.70 kg/m   Wt Readings from Last 3 Encounters:  08/31/23 200 lb (90.7 kg)  11/01/21 183 lb (83 kg)  08/02/21 189 lb 4 oz (85.8 kg)    Physical Exam Vitals  and nursing note reviewed.  Constitutional:      General: He is not in acute distress.    Appearance: He is well-developed. He is not diaphoretic.  Eyes:     General: No scleral icterus.    Conjunctiva/sclera: Conjunctivae normal.  Neck:     Thyroid: No thyromegaly.  Cardiovascular:     Rate and Rhythm: Normal rate and regular rhythm.     Heart sounds: Normal heart sounds. No murmur heard. Pulmonary:     Effort: Pulmonary effort is normal. No respiratory distress.     Breath sounds: Normal breath sounds. No wheezing.  Musculoskeletal:        General: No swelling. Normal range of motion.     Cervical back: Neck supple.  Lymphadenopathy:     Cervical: No cervical  adenopathy.  Skin:    General: Skin is warm and dry.     Findings: No rash.  Neurological:     Mental Status: He is alert and oriented to person, place, and time.     Coordination: Coordination normal.  Psychiatric:        Behavior: Behavior normal.       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)   Relevant Medications   isosorbide  dinitrate (ISORDIL ) 5 MG tablet   carvedilol  (COREG ) 3.125 MG tablet   aspirin  EC (ASPIRIN  LOW DOSE) 81 MG tablet   atorvastatin  (LIPITOR ) 80 MG tablet   clopidogrel  (PLAVIX ) 75 MG tablet   amLODipine (NORVASC) 10 MG tablet   Chronic combined systolic and diastolic HF (heart failure), 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   Relevant Medications   isosorbide  dinitrate (ISORDIL ) 5 MG tablet   carvedilol  (COREG ) 3.125 MG tablet   aspirin  EC (ASPIRIN   LOW DOSE) 81 MG tablet   atorvastatin  (LIPITOR ) 80 MG tablet   amLODipine (NORVASC) 10 MG tablet     Endocrine   Diabetes mellitus with complication (HCC) - Primary   Relevant Medications   aspirin  EC (ASPIRIN  LOW DOSE) 81 MG tablet   atorvastatin  (LIPITOR ) 80 MG tablet   insulin  glargine (LANTUS  SOLOSTAR) 100 UNIT/ML Solostar Pen   Other Relevant Orders   Bayer DCA Hb A1c Waived   CMP14+EGFR     Genitourinary   CKD (chronic kidney disease), stage III (HCC)     Other   Anxiety and depression   Relevant Medications   mirtazapine  (REMERON ) 7.5 MG tablet   Dyslipidemia, goal LDL below 70   Relevant Medications   isosorbide  dinitrate (ISORDIL ) 5 MG tablet   carvedilol  (COREG ) 3.125 MG tablet   aspirin  EC (ASPIRIN  LOW DOSE) 81 MG tablet   atorvastatin  (LIPITOR ) 80 MG tablet   amLODipine (NORVASC) 10 MG tablet   Other Visit Diagnoses       Mixed hyperlipidemia       Relevant Medications   isosorbide  dinitrate (ISORDIL ) 5 MG tablet   carvedilol  (COREG ) 3.125 MG tablet   aspirin  EC (ASPIRIN  LOW DOSE) 81 MG tablet   atorvastatin  (LIPITOR ) 80 MG tablet   amLODipine (NORVASC) 10 MG tablet       Will check bloodwork on the way out and refill medications  He did not know his dose of amlodipine so we will increase it to the higher dose of 10 mg if he was not already on it. Follow up plan: Return if symptoms worsen or fail to improve, for 1-2 month follow-up hypertension anxiety and depression.  Counseling provided for all of the vaccine components Orders Placed This Encounter  Procedures   Bayer [facility] Hb A1c Waived   CMP14+EGFR    Jolyne Needs, MD [facility] 08/31/2023, 4:01 PM

## 2023-09-01 LAB — CMP14+EGFR
ALT: 17 IU/L (ref 0–44)
AST: 9 IU/L (ref 0–40)
Albumin: 3.5 g/dL — ABNORMAL LOW (ref 3.8–4.9)
Alkaline Phosphatase: 122 IU/L — ABNORMAL HIGH (ref 44–121)
BUN/Creatinine Ratio: 17 (ref 9–20)
BUN: 28 mg/dL — ABNORMAL HIGH (ref 6–24)
Bilirubin Total: 0.2 mg/dL (ref 0.0–1.2)
CO2: 24 mmol/L (ref 20–29)
Calcium: 8.9 mg/dL (ref 8.7–10.2)
Chloride: 104 mmol/L (ref 96–106)
Creatinine, Ser: 1.63 mg/dL — ABNORMAL HIGH (ref 0.76–1.27)
Globulin, Total: 2.7 g/dL (ref 1.5–4.5)
Glucose: 472 mg/dL (ref 70–99)
Potassium: 5.3 mmol/L — ABNORMAL HIGH (ref 3.5–5.2)
Sodium: 140 mmol/L (ref 134–144)
Total Protein: 6.2 g/dL (ref 6.0–8.5)
eGFR: 49 mL/min/{1.73_m2} — ABNORMAL LOW (ref >59)

## 2023-09-02 ENCOUNTER — Encounter: Payer: Self-pay | Admitting: Family Medicine

## 2023-10-05 ENCOUNTER — Ambulatory Visit

## 2023-10-21 ENCOUNTER — Ambulatory Visit: Payer: Self-pay | Admitting: Family Medicine

## 2023-12-03 ENCOUNTER — Ambulatory Visit: Admitting: Family Medicine

## 2023-12-03 ENCOUNTER — Ambulatory Visit (INDEPENDENT_AMBULATORY_CARE_PROVIDER_SITE_OTHER): Admitting: *Deleted

## 2023-12-03 ENCOUNTER — Encounter (HOSPITAL_COMMUNITY): Payer: Self-pay

## 2023-12-03 ENCOUNTER — Other Ambulatory Visit: Payer: Self-pay

## 2023-12-03 ENCOUNTER — Encounter: Payer: Self-pay | Admitting: Family Medicine

## 2023-12-03 ENCOUNTER — Inpatient Hospital Stay (HOSPITAL_COMMUNITY)
Admission: EM | Admit: 2023-12-03 | Discharge: 2023-12-08 | DRG: 522 | Disposition: A | Discharge status: Skilled Nursing Facility | Attending: Internal Medicine | Admitting: Internal Medicine

## 2023-12-03 ENCOUNTER — Emergency Department (HOSPITAL_COMMUNITY)

## 2023-12-03 VITALS — BP 201/90 | HR 83 | Ht 70.0 in | Wt 202.0 lb

## 2023-12-03 DIAGNOSIS — Z91148 Patient's other noncompliance with medication regimen for other reason: Secondary | ICD-10-CM | POA: Diagnosis not present

## 2023-12-03 DIAGNOSIS — I420 Dilated cardiomyopathy: Secondary | ICD-10-CM | POA: Diagnosis present

## 2023-12-03 DIAGNOSIS — I252 Old myocardial infarction: Secondary | ICD-10-CM

## 2023-12-03 DIAGNOSIS — E782 Mixed hyperlipidemia: Secondary | ICD-10-CM

## 2023-12-03 DIAGNOSIS — N179 Acute kidney failure, unspecified: Secondary | ICD-10-CM | POA: Diagnosis not present

## 2023-12-03 DIAGNOSIS — Z7982 Long term (current) use of aspirin: Secondary | ICD-10-CM | POA: Diagnosis not present

## 2023-12-03 DIAGNOSIS — E669 Obesity, unspecified: Secondary | ICD-10-CM | POA: Diagnosis present

## 2023-12-03 DIAGNOSIS — E1169 Type 2 diabetes mellitus with other specified complication: Secondary | ICD-10-CM | POA: Diagnosis present

## 2023-12-03 DIAGNOSIS — Z794 Long term (current) use of insulin: Secondary | ICD-10-CM

## 2023-12-03 DIAGNOSIS — I5022 Chronic systolic (congestive) heart failure: Secondary | ICD-10-CM | POA: Diagnosis not present

## 2023-12-03 DIAGNOSIS — I739 Peripheral vascular disease, unspecified: Secondary | ICD-10-CM | POA: Diagnosis not present

## 2023-12-03 DIAGNOSIS — Z7902 Long term (current) use of antithrombotics/antiplatelets: Secondary | ICD-10-CM

## 2023-12-03 DIAGNOSIS — I25119 Atherosclerotic heart disease of native coronary artery with unspecified angina pectoris: Secondary | ICD-10-CM

## 2023-12-03 DIAGNOSIS — N401 Enlarged prostate with lower urinary tract symptoms: Secondary | ICD-10-CM | POA: Diagnosis present

## 2023-12-03 DIAGNOSIS — E785 Hyperlipidemia, unspecified: Secondary | ICD-10-CM

## 2023-12-03 DIAGNOSIS — I6521 Occlusion and stenosis of right carotid artery: Secondary | ICD-10-CM | POA: Diagnosis present

## 2023-12-03 DIAGNOSIS — Z955 Presence of coronary angioplasty implant and graft: Secondary | ICD-10-CM

## 2023-12-03 DIAGNOSIS — Y831 Surgical operation with implant of artificial internal device as the cause of abnormal reaction of the patient, or of later complication, without mention of misadventure at the time of the procedure: Secondary | ICD-10-CM | POA: Diagnosis present

## 2023-12-03 DIAGNOSIS — R9431 Abnormal electrocardiogram [ECG] [EKG]: Secondary | ICD-10-CM | POA: Diagnosis present

## 2023-12-03 DIAGNOSIS — N1831 Chronic kidney disease, stage 3a: Secondary | ICD-10-CM | POA: Diagnosis present

## 2023-12-03 DIAGNOSIS — I272 Pulmonary hypertension, unspecified: Secondary | ICD-10-CM | POA: Diagnosis present

## 2023-12-03 DIAGNOSIS — E1139 Type 2 diabetes mellitus with other diabetic ophthalmic complication: Secondary | ICD-10-CM

## 2023-12-03 DIAGNOSIS — D62 Acute posthemorrhagic anemia: Secondary | ICD-10-CM | POA: Diagnosis not present

## 2023-12-03 DIAGNOSIS — E118 Type 2 diabetes mellitus with unspecified complications: Secondary | ICD-10-CM

## 2023-12-03 DIAGNOSIS — E1151 Type 2 diabetes mellitus with diabetic peripheral angiopathy without gangrene: Secondary | ICD-10-CM | POA: Diagnosis present

## 2023-12-03 DIAGNOSIS — Z8673 Personal history of transient ischemic attack (TIA), and cerebral infarction without residual deficits: Secondary | ICD-10-CM

## 2023-12-03 DIAGNOSIS — S50312A Abrasion of left elbow, initial encounter: Secondary | ICD-10-CM | POA: Diagnosis present

## 2023-12-03 DIAGNOSIS — I255 Ischemic cardiomyopathy: Secondary | ICD-10-CM | POA: Diagnosis not present

## 2023-12-03 DIAGNOSIS — S72002A Fracture of unspecified part of neck of left femur, initial encounter for closed fracture: Secondary | ICD-10-CM | POA: Diagnosis present

## 2023-12-03 DIAGNOSIS — N183 Chronic kidney disease, stage 3 unspecified: Secondary | ICD-10-CM

## 2023-12-03 DIAGNOSIS — Z7985 Long-term (current) use of injectable non-insulin antidiabetic drugs: Secondary | ICD-10-CM

## 2023-12-03 DIAGNOSIS — Z951 Presence of aortocoronary bypass graft: Secondary | ICD-10-CM

## 2023-12-03 DIAGNOSIS — E1165 Type 2 diabetes mellitus with hyperglycemia: Secondary | ICD-10-CM | POA: Diagnosis present

## 2023-12-03 DIAGNOSIS — Z01810 Encounter for preprocedural cardiovascular examination: Secondary | ICD-10-CM | POA: Diagnosis not present

## 2023-12-03 DIAGNOSIS — I130 Hypertensive heart and chronic kidney disease with heart failure and stage 1 through stage 4 chronic kidney disease, or unspecified chronic kidney disease: Secondary | ICD-10-CM | POA: Diagnosis present

## 2023-12-03 DIAGNOSIS — I2510 Atherosclerotic heart disease of native coronary artery without angina pectoris: Secondary | ICD-10-CM | POA: Diagnosis present

## 2023-12-03 DIAGNOSIS — I10 Essential (primary) hypertension: Secondary | ICD-10-CM

## 2023-12-03 DIAGNOSIS — Z96642 Presence of left artificial hip joint: Secondary | ICD-10-CM | POA: Diagnosis not present

## 2023-12-03 DIAGNOSIS — I5042 Chronic combined systolic (congestive) and diastolic (congestive) heart failure: Secondary | ICD-10-CM

## 2023-12-03 DIAGNOSIS — Z833 Family history of diabetes mellitus: Secondary | ICD-10-CM

## 2023-12-03 DIAGNOSIS — Z8249 Family history of ischemic heart disease and other diseases of the circulatory system: Secondary | ICD-10-CM

## 2023-12-03 DIAGNOSIS — I6523 Occlusion and stenosis of bilateral carotid arteries: Secondary | ICD-10-CM | POA: Diagnosis not present

## 2023-12-03 DIAGNOSIS — Z7984 Long term (current) use of oral hypoglycemic drugs: Secondary | ICD-10-CM

## 2023-12-03 DIAGNOSIS — E1122 Type 2 diabetes mellitus with diabetic chronic kidney disease: Secondary | ICD-10-CM | POA: Diagnosis present

## 2023-12-03 DIAGNOSIS — Z720 Tobacco use: Secondary | ICD-10-CM

## 2023-12-03 DIAGNOSIS — W010XXA Fall on same level from slipping, tripping and stumbling without subsequent striking against object, initial encounter: Secondary | ICD-10-CM | POA: Diagnosis present

## 2023-12-03 DIAGNOSIS — E7800 Pure hypercholesterolemia, unspecified: Secondary | ICD-10-CM | POA: Diagnosis not present

## 2023-12-03 DIAGNOSIS — R338 Other retention of urine: Secondary | ICD-10-CM | POA: Diagnosis present

## 2023-12-03 DIAGNOSIS — Z79899 Other long term (current) drug therapy: Secondary | ICD-10-CM

## 2023-12-03 DIAGNOSIS — E440 Moderate protein-calorie malnutrition: Secondary | ICD-10-CM | POA: Diagnosis present

## 2023-12-03 DIAGNOSIS — Z95810 Presence of automatic (implantable) cardiac defibrillator: Secondary | ICD-10-CM

## 2023-12-03 DIAGNOSIS — Z6828 Body mass index (BMI) 28.0-28.9, adult: Secondary | ICD-10-CM | POA: Diagnosis not present

## 2023-12-03 DIAGNOSIS — F419 Anxiety disorder, unspecified: Secondary | ICD-10-CM | POA: Diagnosis present

## 2023-12-03 HISTORY — DX: Presence of automatic (implantable) cardiac defibrillator: Z95.810

## 2023-12-03 LAB — CBC WITH DIFFERENTIAL/PLATELET
Abs Immature Granulocytes: 0.1 K/uL — ABNORMAL HIGH (ref 0.00–0.07)
Basophils Absolute: 0.1 K/uL (ref 0.0–0.1)
Basophils Relative: 0 %
Eosinophils Absolute: 0.1 K/uL (ref 0.0–0.5)
Eosinophils Relative: 0 %
HCT: 44.4 % (ref 39.0–52.0)
Hemoglobin: 14.7 g/dL (ref 13.0–17.0)
Immature Granulocytes: 1 %
Lymphocytes Relative: 12 %
Lymphs Abs: 2.1 K/uL (ref 0.7–4.0)
MCH: 27 pg (ref 26.0–34.0)
MCHC: 33.1 g/dL (ref 30.0–36.0)
MCV: 81.6 fL (ref 80.0–100.0)
Monocytes Absolute: 1.1 K/uL — ABNORMAL HIGH (ref 0.1–1.0)
Monocytes Relative: 6 %
Neutro Abs: 14.1 K/uL — ABNORMAL HIGH (ref 1.7–7.7)
Neutrophils Relative %: 81 %
Platelets: 239 K/uL (ref 150–400)
RBC: 5.44 MIL/uL (ref 4.22–5.81)
RDW: 13.8 % (ref 11.5–15.5)
WBC: 17.5 K/uL — ABNORMAL HIGH (ref 4.0–10.5)
nRBC: 0 % (ref 0.0–0.2)

## 2023-12-03 LAB — COMPREHENSIVE METABOLIC PANEL WITH GFR
ALT: 13 U/L (ref 0–44)
AST: 21 U/L (ref 15–41)
Albumin: 3.2 g/dL — ABNORMAL LOW (ref 3.5–5.0)
Alkaline Phosphatase: 106 U/L (ref 38–126)
Anion gap: 15 (ref 5–15)
BUN: 30 mg/dL — ABNORMAL HIGH (ref 6–20)
CO2: 20 mmol/L — ABNORMAL LOW (ref 22–32)
Calcium: 8.9 mg/dL (ref 8.9–10.3)
Chloride: 100 mmol/L (ref 98–111)
Creatinine, Ser: 2.09 mg/dL — ABNORMAL HIGH (ref 0.61–1.24)
GFR, Estimated: 36 mL/min — ABNORMAL LOW (ref >60)
Glucose, Bld: 394 mg/dL — ABNORMAL HIGH (ref 70–99)
Potassium: 4.5 mmol/L (ref 3.5–5.1)
Sodium: 135 mmol/L (ref 135–145)
Total Bilirubin: 0.7 mg/dL (ref 0.0–1.2)
Total Protein: 7.2 g/dL (ref 6.5–8.1)

## 2023-12-03 LAB — BAYER DCA HB A1C WAIVED: HB A1C (BAYER DCA - WAIVED): 13.1 % — ABNORMAL HIGH (ref 4.8–5.6)

## 2023-12-03 LAB — PROTIME-INR
INR: 0.9 (ref 0.8–1.2)
Prothrombin Time: 12.5 s (ref 11.4–15.2)

## 2023-12-03 LAB — HM DIABETES EYE EXAM

## 2023-12-03 MED ORDER — HYDROMORPHONE HCL 1 MG/ML IJ SOLN
1.0000 mg | Freq: Once | INTRAMUSCULAR | Status: AC
  Administered 2023-12-03: 1 mg via INTRAVENOUS
  Filled 2023-12-03: qty 1

## 2023-12-03 MED ORDER — SEMAGLUTIDE(0.25 OR 0.5MG/DOS) 2 MG/3ML ~~LOC~~ SOPN: 0.2500 mg | PEN_INJECTOR | SUBCUTANEOUS | 3 refills | Status: DC

## 2023-12-03 MED ORDER — ONDANSETRON HCL 4 MG/2ML IJ SOLN
4.0000 mg | Freq: Once | INTRAMUSCULAR | Status: AC
  Administered 2023-12-03: 4 mg via INTRAVENOUS
  Filled 2023-12-03: qty 2

## 2023-12-03 NOTE — ED Notes (Signed)
 Pt's O2 sat ranging 85-88% on RA. Pt placed on 2lpm [place], O2 sat increased to 98%.

## 2023-12-03 NOTE — ED Provider Notes (Signed)
 [place] EMERGENCY DEPARTMENT AT [facility] Provider Note   CSN: [id number] Arrival date & time: 12/03/23  1507     Patient presents with: Hip Pain   Douglas Carr is a 58 y.o. male.    Hip Pain Pertinent negatives include no chest pain, no abdominal pain, no headaches and no shortness of breath.       Douglas Carr is a 58 y.o. male past medical history of hypertension, dilated cardiomyopathy with coronary artery stent placement, CHF, diabetes, ICD placement, and CKD who presents to the Emergency Department complaining of left hip pain, left elbow pain and forearm abrasion.  States he was pushing on his son's truck when he fell landing on his left hip area.  He has been unable to bear weight on the affected leg since the fall.  Abrasions along his forearm some mild pain but able to perform range of motion on the elbow.  He denies head injury or LOC.  He is supposed to take aspirin  and clopidogrel  but has been off of this medication for 2 weeks.  He denies any back pain, headache, dizziness, nausea or vomiting.  Denies any chest pain shortness of breath or rib pain.  Prior to Admission medications   Medication Sig Start Date End Date Taking? Authorizing Provider  amLODipine  (NORVASC ) 10 MG tablet Take 1 tablet (10 mg total) by mouth daily. 08/31/23   [name], [name], MD  aspirin  EC (ASPIRIN  LOW DOSE) 81 MG tablet Take 1 tablet (81 mg total) by mouth daily. Swallow whole. 08/31/23   [name], [name], MD  atorvastatin  (LIPITOR ) 80 MG tablet Take 1 tablet (80 mg total) by mouth every evening. 08/31/23   [name], [name], MD  blood glucose meter kit and supplies KIT Inject 1 each into the skin 4 (four) times daily as needed. Dispense based on patient and insurance preference. Use up to four times daily as directed. (FOR ICD-9 250.00, 250.01). 06/30/19   [name], [name], MD  carvedilol  (COREG ) 3.125 MG tablet Take 1 tablet (3.125 mg total) by mouth every 12  (twelve) hours. 08/31/23   [name], [name], MD  clopidogrel  (PLAVIX ) 75 MG tablet Take 1 tablet (75 mg total) by mouth every evening. 08/31/23   [name], [name], MD  glucose blood (ONETOUCH VERIO) test strip 1 each by Other route in the morning, at noon, in the evening, and at bedtime. use for testing 06/30/19   [name], [name], MD  insulin  glargine (LANTUS  SOLOSTAR) 100 UNIT/ML Solostar Pen Inject 11 Units into the skin at bedtime. 08/31/23   [name], [name], MD  Insulin  Pen Needle (B-D ULTRAFINE III SHORT PEN) 31G X 8 MM MISC USE TO INJECT INSULIN  DAILY DX E11.9 02/21/20   [name], [name], MD  isosorbide  dinitrate (ISORDIL ) 5 MG tablet Take 1 tablet (5 mg total) by mouth 2 (two) times daily. Please contact the office to schedule appointment for additional refills. 1st Attempt. 08/31/23   [name], [name], MD  mirtazapine  (REMERON ) 7.5 MG tablet Take 1 tablet (7.5 mg total) by mouth at bedtime. 08/31/23   [name], [name], MD  OneTouch Delica Lancets 33G MISC Test blood sugars three times daily 09/30/19   [name], [name], MD  Semaglutide ,0.25 or 0.5MG /DOS, 2 MG/3ML SOPN Inject 0.25 mg into the skin once a week. 12/03/23   [name], [name], MD  tamsulosin  (FLOMAX ) 0.4 MG CAPS capsule Take 1 capsule (0.4 mg total) by mouth daily. 08/31/23   [name],  [name], MD    Allergies: Patient has no known allergies.    Review of Systems  Constitutional:  Negative for appetite change, chills and fever.  Eyes:  Negative for visual disturbance.  Respiratory:  Negative for shortness of breath.   Cardiovascular:  Negative for chest pain.  Gastrointestinal:  Negative for abdominal pain, nausea and vomiting.  Genitourinary:  Negative for dysuria and flank pain.  Musculoskeletal:  Positive for arthralgias (left hip pain, left elbow pain). Negative for neck pain.  Neurological:  Negative for dizziness, syncope, weakness and headaches.    Updated Vital Signs BP (!) 162/75 (BP  Location: Right Arm)   Pulse 75   Temp 97.6 F (36.4 C)   Resp 18   Ht 5' 10 (1.778 m)   Wt 91.6 kg   SpO2 100%   BMI 28.98 kg/m   Physical Exam Vitals and nursing note reviewed.  Constitutional:      General: He is not in acute distress.    Appearance: Normal appearance. He is not ill-appearing or toxic-appearing.  HENT:     Head: Atraumatic.     Mouth/Throat:     Mouth: Mucous membranes are moist.  Eyes:     Extraocular Movements: Extraocular movements intact.     Conjunctiva/sclera: Conjunctivae normal.     Pupils: Pupils are equal, round, and reactive to light.  Cardiovascular:     Rate and Rhythm: Normal rate and regular rhythm.     Pulses: Normal pulses.  Pulmonary:     Effort: Pulmonary effort is normal.  Chest:     Chest wall: No tenderness.  Abdominal:     General: There is no distension.     Palpations: Abdomen is soft.     Tenderness: There is no abdominal tenderness. There is no guarding.  Musculoskeletal:        General: Signs of injury present.     Cervical back: Normal range of motion. No rigidity or tenderness.     Left hip: Tenderness present. No crepitus. Decreased range of motion. Normal strength.     Comments: Pain with any attempted range of motion of the left hip.  Left leg appears shortened and externally rotated.  Skin:    General: Skin is warm.     Capillary Refill: Capillary refill takes less than 2 seconds.     Comments: Large abrasion to left elbow and proximal forearm, no laceration  Neurological:     General: No focal deficit present.     Mental Status: He is alert.     Sensory: No sensory deficit.     Motor: No weakness.     (all labs ordered are listed, but only abnormal results are displayed) Labs Reviewed  CBC WITH DIFFERENTIAL/PLATELET - Abnormal; Notable for the following components:      Result Value   WBC 17.5 (*)    Neutro Abs 14.1 (*)    Monocytes Absolute 1.1 (*)    Abs Immature Granulocytes 0.10 (*)    All other  components within normal limits  COMPREHENSIVE METABOLIC PANEL WITH GFR - Abnormal; Notable for the following components:   CO2 20 (*)    Glucose, Bld 394 (*)    BUN 30 (*)    Creatinine, Ser 2.09 (*)    Albumin  3.2 (*)    GFR, Estimated 36 (*)    All other components within normal limits  PROTIME-INR    EKG: None  Radiology: DG Knee Left Port Result Date: 12/03/2023 CLINICAL DATA:  Fall EXAM: PORTABLE LEFT KNEE - 1-2 VIEW COMPARISON:  None Available. FINDINGS: No evidence of fracture, dislocation, or joint effusion. There are mild degenerative osteophytes of the knee with mild medial compartment joint space narrowing. Peripheral vascular calcifications are present. Tissues otherwise within normal limits. IMPRESSION: 1. No acute fracture or dislocation. 2. Mild degenerative changes. Electronically Signed   By: Greig Pique M.D.   On: 12/03/2023 21:13   DG Chest Portable 1 View Result Date: 12/03/2023 CLINICAL DATA:  Weakness EXAM: PORTABLE CHEST 1 VIEW COMPARISON:  05/26/2021 FINDINGS: Multiple benign calcified granuloma are seen within the right lung. Lungs are otherwise clear. No pneumothorax or pleural effusion. Coronary artery bypass grafting has been performed. Cardiac size within limits. Left subclavian single lead pacemaker defibrillator is unchanged. Pulmonary vascularity is normal. No acute bone abnormality IMPRESSION: 1. No active disease. Electronically Signed   By: Dorethia Molt M.D.   On: 12/03/2023 19:58   DG HIP UNILAT WITH PELVIS 2-3 VIEWS LEFT Result Date: 12/03/2023 CLINICAL DATA:  Pain after a fall EXAM: DG HIP (WITH OR WITHOUT PELVIS) 2-3V LEFT COMPARISON:  CT abdomen and pelvis 06/11/2016 FINDINGS: Transverse fracture through the left femoral neck with varus angulation of the proximal femur. No dislocation at the hip joint. No inter trochanteric involvement is identified. Degenerative changes in the left hip as well as in the right hip and lower lumbar spine. Visualized  pelvis appears intact. IMPRESSION: Transverse fracture of the left femoral neck with varus angulation. Electronically Signed   By: Elsie Gravely M.D.   On: 12/03/2023 19:17   DG Elbow Complete Left Result Date: 12/03/2023 CLINICAL DATA:  Pain after a fall.  Skin lacerations. EXAM: LEFT ELBOW - COMPLETE 3+ VIEW COMPARISON:  None Available. FINDINGS: Surgical clip in the antecubital fossa. Soft tissues are otherwise unremarkable. No radiopaque foreign bodies or soft tissue gas. Bones appear intact. No evidence of acute fracture or dislocation. No focal bone lesion or bone destruction. No significant effusion. IMPRESSION: No acute bony abnormalities. Electronically Signed   By: Elsie Gravely M.D.   On: 12/03/2023 19:16     Procedures   Medications Ordered in the ED  HYDROmorphone  (DILAUDID ) injection 1 mg (has no administration in time range)  ondansetron  (ZOFRAN ) injection 4 mg (has no administration in time range)                                    Medical Decision Making Patient here for evaluation of injury sustained after mechanical fall.  Was pushing on a truck when he slipped and fell landing on his left hip.  Denies any head injury or LOC.  He is supposed to take aspirin  and clopidogrel  but has been off of these medications for 2 weeks.  Has been unable to bear any weight on the left lower extremity since the accident occurred.  He also has abrasion to left elbow and forearm area.  Td is up-to-date.  Clinically I suspect hip fracture.  Dislocation, contusion sprain also considered. Likely sprain left elbow.  Abrasion noted, no laceration.  Extremities are neurovascularly intact.  Amount and/or Complexity of Data Reviewed Labs: ordered.    Details: Leukocytosis white count 17.5, likely secondary to trauma.  Serum creatinine elevated but near baseline from 3 months ago Radiology: ordered.    Details: X-ray left elbow no evidence of fracture or dislocation  X-ray left hip shows  transverse left femoral neck fracture  with varus angulation ECG/medicine tests: ordered.    Details: EKG shows sinus rhythm with right bundle branch block and left anterior fascicular block Discussion of management or test interpretation with external provider(s): Discussed with local orthopedics, Dr. Margrette and given patient's significant cardiac history and increased risk for general anesthesia, that he would be better served by orthopedics in [place].  I have discussed with orthopedics, Dr. Fidel in [place] who request patient be admitted by [facility] hospitalist with plan for n.p.o. after midnight  Discussed with [facility] hospitalist, Dr. Charlton who agrees to admit and arrange for transfer  Risk Prescription drug management. Decision regarding hospitalization.        Final diagnoses:  Closed fracture of left hip, initial encounter [facility])    ED Discharge Orders     None          Herlinda Madelin RIGGERS 12/03/23 2239    Suzette Pac, MD 12/04/23 1430
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
## 2023-12-03 NOTE — ED Notes (Signed)
 Pt's left arm cleaned and bandaged
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
## 2023-12-03 NOTE — Progress Notes (Signed)
 BP (!) 201/90   Pulse 83   Ht 5' 10 (1.778 m)   Wt 202 lb (91.6 kg)   SpO2 96%   BMI 28.98 kg/m    Subjective:   Patient ID: Douglas Carr, male    DOB: 01-15-1966, 58 y.o.   MRN: [id number]  HPI: Douglas Carr is a 58 y.o. male presenting on 12/03/2023 for Medical Management of Chronic Issues (Pt does not have any health insurance. He has not been taking any of his medications), Diabetes, Hyperlipidemia, Hypertension, and Chronic Kidney Disease    Type 2 diabetes mellitus Patient comes in today for recheck of his diabetes. Patient has been currently taking no medication because he has not picked up any of his medications and is not really watching his diet much either. Patient is not currently on an ACE inhibitor/ARB. Patient has not seen an ophthalmologist this year. Patient denies any new issues with their feet. The symptom started onset as an adult hypertension and hyperlipidemia and CHF and CAD ARE RELATED TO DM   Hypertension and CAD and CHF Patient is currently on aspirin  and carvedilol  and clopidogrel  and Imdur  and hydralazine , but he is not taking any of them yet because he just got insurance and has not picked them up, and their blood pressure today is 201/90. Patient denies any lightheadedness or dizziness. Patient denies headaches, blurred vision, chest pains, shortness of breath, or weakness. Denies any side effects from medication and is content with current medication.   Hyperlipidemia Patient is coming in for recheck of his hyperlipidemia. The patient is currently taking atorvastatin  and Plavix  but is not taking any of them because he has not picked them up. They deny any issues with myalgias or history of liver damage from it. They deny any focal numbness or weakness or chest pain.    Relevant past medical, surgical, family and social history reviewed and updated as indicated. Interim medical history since our last visit reviewed. Allergies and medications  reviewed and updated.  Review of Systems  Constitutional:  Negative for chills and fever.  Eyes:  Positive for visual disturbance.  Respiratory:  Negative for shortness of breath and wheezing.   Cardiovascular:  Negative for chest pain and leg swelling.  Genitourinary:  Negative for dysuria.  Musculoskeletal:  Negative for back pain and gait problem.  Skin:  Negative for rash.  Neurological:  Positive for weakness.  All other systems reviewed and are negative.   Per HPI unless specifically indicated above   Allergies as of 12/03/2023   No Known Allergies      Medication List        Accurate as of December 03, 2023  3:16 PM. If you have any questions, ask your nurse or doctor.          amLODipine  10 MG tablet Commonly known as: NORVASC  Take 1 tablet (10 mg total) by mouth daily.   aspirin  EC 81 MG tablet Commonly known as: Aspirin  Low Dose Take 1 tablet (81 mg total) by mouth daily. Swallow whole.   atorvastatin  80 MG tablet Commonly known as: LIPITOR  Take 1 tablet (80 mg total) by mouth every evening.   B-D ULTRAFINE III SHORT PEN 31G X 8 MM Misc Generic drug: Insulin  Pen Needle USE TO INJECT INSULIN  DAILY DX E11.9   blood glucose meter kit and supplies Kit Inject 1 each into the skin 4 (four) times daily as needed. Dispense based on patient and insurance preference. Use up to four times  daily as directed. (FOR ICD-9 250.00, 250.01).   carvedilol  3.125 MG tablet Commonly known as: COREG  Take 1 tablet (3.125 mg total) by mouth every 12 (twelve) hours.   clopidogrel  75 MG tablet Commonly known as: PLAVIX  Take 1 tablet (75 mg total) by mouth every evening.   hydrALAZINE  10 MG tablet Commonly known as: APRESOLINE  Take 1 tablet (10 mg total) by mouth every 8 (eight) hours.   ibuprofen 200 MG tablet Commonly known as: ADVIL Take 400 mg by mouth 2 (two) times daily as needed for headache or moderate pain (pain score 4-6).   insulin  aspart 100 UNIT/ML  injection Commonly known as: novoLOG  Inject 5 Units into the skin 3 (three) times daily with meals.   insulin  aspart 100 UNIT/ML injection Commonly known as: novoLOG  Inject 0-15 Units into the skin 3 (three) times daily with meals.   insulin  glargine-yfgn 100 UNIT/ML injection Commonly known as: SEMGLEE  Inject 0.12 mLs (12 Units total) into the skin at bedtime.   isosorbide  dinitrate 5 MG tablet Commonly known as: ISORDIL  Take 1 tablet (5 mg total) by mouth 2 (two) times daily. Please contact the office to schedule appointment for additional refills. 1st Attempt.   Lantus  SoloStar 100 UNIT/ML Solostar Pen Generic drug: insulin  glargine Inject 11 Units into the skin at bedtime.   METFORMIN  HCL PO Take 500 mg by mouth at bedtime.   methocarbamol  500 MG tablet Commonly known as: ROBAXIN  Take 1 tablet (500 mg total) by mouth every 6 (six) hours as needed for muscle spasms.   mirtazapine  7.5 MG tablet Commonly known as: REMERON  Take 1 tablet (7.5 mg total) by mouth at bedtime.   mirtazapine  30 MG tablet Commonly known as: REMERON  Take 30 mg by mouth at bedtime.   multivitamin with minerals Tabs tablet Take 1 tablet by mouth daily.   OneTouch Delica Lancets 33G Misc Test blood sugars three times daily   OneTouch Verio test strip Generic drug: glucose blood 1 each by Other route in the morning, at noon, in the evening, and at bedtime. use for testing   oxyCODONE  5 MG immediate release tablet Commonly known as: Roxicodone  Take 1 tablet (5 mg total) by mouth every 8 (eight) hours as needed for severe pain (pain score 7-10).   polyethylene glycol 17 g packet Commonly known as: MIRALAX  / GLYCOLAX  Take 17 g by mouth 2 (two) times daily.   Semaglutide (0.25 or 0.5MG /DOS) 2 MG/3ML Sopn Inject 0.25 mg into the skin once a week. Started by: [name] Jansen Sciuto   tamsulosin  0.4 MG Caps capsule Commonly known as: FLOMAX  Take 1 capsule (0.4 mg total) by mouth daily.   vitamin  D3 25 MCG tablet Commonly known as: CHOLECALCIFEROL  Take 2 tablets (2,000 Units total) by mouth daily.         Objective:   BP (!) 201/90   Pulse 83   Ht 5' 10 (1.778 m)   Wt 202 lb (91.6 kg)   SpO2 96%   BMI 28.98 kg/m   Wt Readings from Last 3 Encounters:  12/07/23 201 lb 11.5 oz (91.5 kg)  12/03/23 202 lb (91.6 kg)  08/31/23 200 lb (90.7 kg)    Physical Exam Vitals and nursing note reviewed.  Constitutional:      General: He is not in acute distress.    Appearance: He is well-developed. He is not diaphoretic.     Comments: Right eye with droop, weakness on the right side as well  Eyes:     General: No  scleral icterus.    Conjunctiva/sclera: Conjunctivae normal.  Neck:     Thyroid: No thyromegaly.  Cardiovascular:     Rate and Rhythm: Normal rate and regular rhythm.     Heart sounds: Normal heart sounds. No murmur heard. Pulmonary:     Effort: Pulmonary effort is normal. No respiratory distress.     Breath sounds: Normal breath sounds. No wheezing.  Musculoskeletal:        General: Normal range of motion.     Cervical back: Neck supple.  Lymphadenopathy:     Cervical: No cervical adenopathy.  Skin:    General: Skin is warm and dry.     Findings: No rash.  Neurological:     Mental Status: He is alert and oriented to person, place, and time.     Coordination: Coordination normal.  Psychiatric:        Behavior: Behavior normal.    Physical Exam           Diabetic Foot Exam - Simple   Simple Foot Form Diabetic Foot exam was performed with the following findings: Yes 12/03/2023  1:31 PM  Visual Inspection No deformities, no ulcerations, no other skin breakdown bilaterally: Yes Sensation Testing Intact to touch and monofilament testing bilaterally: Yes Pulse Check Posterior Tibialis and Dorsalis pulse intact bilaterally: Yes Comments      Assessment & Plan:   Problem List Items Addressed This Visit       Cardiovascular and Mediastinum    Essential hypertension (Chronic)   Relevant Orders   Bayer DCA Hb A1c Waived (Completed)   CBC with Differential/Platelet (Completed)   CMP14+EGFR (Completed)   Lipid panel (Completed)   AMB Referral VBCI Care Management   AMB Referral VBCI Care Management   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)   Relevant Medications   Semaglutide ,0.25 or 0.5MG /DOS, 2 MG/3ML SOPN   Other Relevant Orders   AMB Referral VBCI Care Management   Chronic combined systolic and diastolic HF (heart failure), 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     Endocrine   Diabetes mellitus with complication (HCC) - Primary   Relevant Medications   Semaglutide ,0.25 or 0.5MG /DOS, 2 MG/3ML SOPN   Other Relevant Orders   Bayer DCA Hb A1c Waived (Completed)   CBC with Differential/Platelet (Completed)   CMP14+EGFR (Completed)   Lipid panel (Completed)   Microalbumin/Creatinine Ratio, Urine (Completed)   AMB Referral VBCI Care Management   AMB Referral VBCI Care Management     Genitourinary   CKD (chronic kidney disease), stage III (HCC)   Relevant Orders   Bayer DCA Hb A1c Waived (Completed)   CBC with Differential/Platelet (Completed)   CMP14+EGFR (Completed)   Lipid panel (Completed)   Microalbumin/Creatinine Ratio, Urine (Completed)   AMB Referral VBCI Care Management   AMB Referral VBCI Care Management     Other   Dyslipidemia, goal LDL below 70   Relevant Orders   AMB Referral VBCI Care Management   Other Visit Diagnoses       Mixed hyperlipidemia       Relevant Orders   Bayer DCA Hb A1c Waived (Completed)   CBC with Differential/Platelet (Completed)   CMP14+EGFR (Completed)   Lipid panel (Completed)     Type 2 diabetes mellitus with other ophthalmic complication, with long-term current use of insulin  (HCC)       Relevant Medications   Semaglutide ,0.25 or 0.5MG /DOS, 2 MG/3ML SOPN  Patient's A1c was 13.1, blood  pressure elevated but he has not picked up any of his medicines, he does have insurance now so he is going to go pick him up all of his medicines should be at pharmacy.  Will also send Ozempic  but he can get started back on that but his mainstay will be that he does not need the insulin  too.  Really focus on diet as well.  Will refer to our clinical pharmacist as well.  Patient has visual problems in his right eye because of anoxic brain injury. Follow up plan: Return in about 3 months (around 03/04/2024), or if symptoms worsen or fail to improve, for Diabetes recheck.  Counseling provided for all of the vaccine components Orders Placed This Encounter  Procedures   Bayer DCA Hb A1c Waived   CBC with Differential/Platelet   CMP14+EGFR   Lipid panel   Microalbumin/Creatinine Ratio, Urine   AMB Referral VBCI Care Management   AMB Referral VBCI Care Management    Fonda Levins, MD [facility] 12/09/2023, 1:26 PM

## 2023-12-04 ENCOUNTER — Ambulatory Visit: Payer: Self-pay | Admitting: Family Medicine

## 2023-12-04 ENCOUNTER — Inpatient Hospital Stay (HOSPITAL_COMMUNITY): Admitting: Anesthesiology

## 2023-12-04 DIAGNOSIS — E118 Type 2 diabetes mellitus with unspecified complications: Secondary | ICD-10-CM

## 2023-12-04 DIAGNOSIS — E440 Moderate protein-calorie malnutrition: Secondary | ICD-10-CM | POA: Insufficient documentation

## 2023-12-04 DIAGNOSIS — S72002A Fracture of unspecified part of neck of left femur, initial encounter for closed fracture: Secondary | ICD-10-CM | POA: Diagnosis not present

## 2023-12-04 DIAGNOSIS — R9431 Abnormal electrocardiogram [ECG] [EKG]: Secondary | ICD-10-CM | POA: Diagnosis present

## 2023-12-04 DIAGNOSIS — N183 Chronic kidney disease, stage 3 unspecified: Secondary | ICD-10-CM

## 2023-12-04 DIAGNOSIS — I5022 Chronic systolic (congestive) heart failure: Secondary | ICD-10-CM | POA: Diagnosis present

## 2023-12-04 DIAGNOSIS — N179 Acute kidney failure, unspecified: Secondary | ICD-10-CM | POA: Diagnosis present

## 2023-12-04 LAB — BASIC METABOLIC PANEL WITH GFR
Anion gap: 9 (ref 5–15)
BUN: 31 mg/dL — ABNORMAL HIGH (ref 6–20)
CO2: 23 mmol/L (ref 22–32)
Calcium: 8.2 mg/dL — ABNORMAL LOW (ref 8.9–10.3)
Chloride: 104 mmol/L (ref 98–111)
Creatinine, Ser: 2.02 mg/dL — ABNORMAL HIGH (ref 0.61–1.24)
GFR, Estimated: 38 mL/min — ABNORMAL LOW (ref >60)
Glucose, Bld: 318 mg/dL — ABNORMAL HIGH (ref 70–99)
Potassium: 4.7 mmol/L (ref 3.5–5.1)
Sodium: 136 mmol/L (ref 135–145)

## 2023-12-04 LAB — SURGICAL PCR SCREEN
MRSA, PCR: NEGATIVE
Staphylococcus aureus: NEGATIVE

## 2023-12-04 LAB — CBC WITH DIFFERENTIAL/PLATELET
Basophils Absolute: 0.1 x10E3/uL (ref 0.0–0.2)
Basos: 1 %
EOS (ABSOLUTE): 0.2 x10E3/uL (ref 0.0–0.4)
Eos: 2 %
Hematocrit: 48.2 % (ref 37.5–51.0)
Hemoglobin: 15.3 g/dL (ref 13.0–17.7)
Immature Grans (Abs): 0 x10E3/uL (ref 0.0–0.1)
Immature Granulocytes: 0 %
Lymphocytes Absolute: 2.3 x10E3/uL (ref 0.7–3.1)
Lymphs: 27 %
MCH: 26.9 pg (ref 26.6–33.0)
MCHC: 31.7 g/dL (ref 31.5–35.7)
MCV: 85 fL (ref 79–97)
Monocytes Absolute: 0.7 x10E3/uL (ref 0.1–0.9)
Monocytes: 9 %
Neutrophils Absolute: 5 x10E3/uL (ref 1.4–7.0)
Neutrophils: 61 %
Platelets: 220 x10E3/uL (ref 150–450)
RBC: 5.68 x10E6/uL (ref 4.14–5.80)
RDW: 14 % (ref 11.6–15.4)
WBC: 8.2 x10E3/uL (ref 3.4–10.8)

## 2023-12-04 LAB — CBG MONITORING, ED
Glucose-Capillary: 366 mg/dL — ABNORMAL HIGH (ref 70–99)
Glucose-Capillary: 233 mg/dL — ABNORMAL HIGH (ref 70–99)
Glucose-Capillary: 244 mg/dL — ABNORMAL HIGH (ref 70–99)

## 2023-12-04 LAB — CBC
HCT: 39.7 % (ref 39.0–52.0)
Hemoglobin: 13.2 g/dL (ref 13.0–17.0)
MCH: 27 pg (ref 26.0–34.0)
MCHC: 33.2 g/dL (ref 30.0–36.0)
MCV: 81.4 fL (ref 80.0–100.0)
Platelets: 199 K/uL (ref 150–400)
RBC: 4.88 MIL/uL (ref 4.22–5.81)
RDW: 13.9 % (ref 11.5–15.5)
WBC: 10.6 K/uL — ABNORMAL HIGH (ref 4.0–10.5)
nRBC: 0 % (ref 0.0–0.2)

## 2023-12-04 LAB — URINALYSIS, COMPLETE (UACMP) WITH MICROSCOPIC
Bilirubin Urine: NEGATIVE
Glucose, UA: >=500 mg/dL — AB
Ketones, ur: NEGATIVE mg/dL
Leukocytes,Ua: NEGATIVE
Nitrite: NEGATIVE
Protein, ur: >=300 mg/dL — AB
Specific Gravity, Urine: 1.018 (ref 1.005–1.030)
pH: 5 (ref 5.0–8.0)

## 2023-12-04 LAB — CMP14+EGFR
ALT: 9 IU/L (ref 0–44)
AST: 10 IU/L (ref 0–40)
Albumin: 3.5 g/dL — AB (ref 3.8–4.9)
Alkaline Phosphatase: 125 IU/L — AB (ref 44–121)
BUN/Creatinine Ratio: 14 (ref 9–20)
BUN: 28 mg/dL — AB (ref 6–24)
Bilirubin Total: <0.2 mg/dL (ref 0.0–1.2)
CO2: 22 mmol/L (ref 20–29)
Calcium: 9.1 mg/dL (ref 8.7–10.2)
Chloride: 99 mmol/L (ref 96–106)
Creatinine, Ser: 2 mg/dL — AB (ref 0.76–1.27)
Globulin, Total: 2.9 g/dL (ref 1.5–4.5)
Glucose: 344 mg/dL — AB (ref 70–99)
Potassium: 5.5 mmol/L — AB (ref 3.5–5.2)
Sodium: 136 mmol/L (ref 134–144)
Total Protein: 6.4 g/dL (ref 6.0–8.5)
eGFR: 38 mL/min/1.73 — AB (ref >59)

## 2023-12-04 LAB — MICROALBUMIN / CREATININE URINE RATIO
Creatinine, Urine: 66.2 mg/dL
Microalb/Creat Ratio: 4830 mg/g{creat} — ABNORMAL HIGH (ref 0–29)
Microalbumin, Urine: 3197.6 ug/mL

## 2023-12-04 LAB — LIPID PANEL
Chol/HDL Ratio: 13.9 ratio — AB (ref 0.0–5.0)
Cholesterol, Total: 430 mg/dL — AB (ref 100–199)
HDL: 31 mg/dL — AB (ref >39)
LDL Chol Calc (NIH): 259 mg/dL — AB (ref 0–99)
Triglycerides: 572 mg/dL — AB (ref 0–149)
VLDL Cholesterol Cal: 140 mg/dL — AB (ref 5–40)

## 2023-12-04 LAB — MAGNESIUM: Magnesium: 2.2 mg/dL (ref 1.7–2.4)

## 2023-12-04 LAB — GLUCOSE, CAPILLARY
Glucose-Capillary: 242 mg/dL — ABNORMAL HIGH (ref 70–99)
Glucose-Capillary: 236 mg/dL — ABNORMAL HIGH (ref 70–99)
Glucose-Capillary: 274 mg/dL — ABNORMAL HIGH (ref 70–99)
Glucose-Capillary: 270 mg/dL — ABNORMAL HIGH (ref 70–99)

## 2023-12-04 LAB — HIV ANTIBODY (ROUTINE TESTING W REFLEX): HIV Screen 4th Generation wRfx: NONREACTIVE

## 2023-12-04 LAB — CREATININE, URINE, RANDOM: Creatinine, Urine: 117 mg/dL

## 2023-12-04 LAB — VITAMIN D 25 HYDROXY (VIT D DEFICIENCY, FRACTURES): Vit D, 25-Hydroxy: 12.49 ng/mL — ABNORMAL LOW (ref 30–100)

## 2023-12-04 LAB — C-REACTIVE PROTEIN: CRP: 5.2 mg/dL — ABNORMAL HIGH (ref <1.0)

## 2023-12-04 LAB — SODIUM, URINE, RANDOM: Sodium, Ur: 31 mmol/L

## 2023-12-04 MED ORDER — METHOCARBAMOL 500 MG PO TABS
500.0000 mg | ORAL_TABLET | Freq: Four times a day (QID) | ORAL | Status: DC | PRN
  Administered 2023-12-04 – 2023-12-06 (×5): 500 mg via ORAL
  Filled 2023-12-04 (×5): qty 1

## 2023-12-04 MED ORDER — SODIUM CHLORIDE 0.9 % IV SOLN: INTRAVENOUS | Status: AC

## 2023-12-04 MED ORDER — FENTANYL CITRATE (PF) 100 MCG/2ML IJ SOLN
INTRAMUSCULAR | Status: AC
  Filled 2023-12-04: qty 2

## 2023-12-04 MED ORDER — OXYCODONE HCL 5 MG PO TABS
5.0000 mg | ORAL_TABLET | ORAL | Status: DC | PRN
  Administered 2023-12-04 – 2023-12-06 (×6): 5 mg via ORAL
  Filled 2023-12-04 (×6): qty 1

## 2023-12-04 MED ORDER — SODIUM CHLORIDE 0.9 % IV BOLUS
500.0000 mL | Freq: Once | INTRAVENOUS | Status: AC
  Administered 2023-12-04: 500 mL via INTRAVENOUS

## 2023-12-04 MED ORDER — TRIMETHOBENZAMIDE HCL 100 MG/ML IM SOLN: 200.0000 mg | Freq: Four times a day (QID) | INTRAMUSCULAR | Status: DC | PRN

## 2023-12-04 MED ORDER — SENNA 8.6 MG PO TABS: 1.0000 | ORAL_TABLET | Freq: Every day | ORAL | Status: DC | PRN

## 2023-12-04 MED ORDER — HYDROMORPHONE HCL 1 MG/ML IJ SOLN
0.5000 mg | INTRAMUSCULAR | Status: DC | PRN
  Administered 2023-12-04: 1 mg via INTRAVENOUS
  Administered 2023-12-04: 0.5 mg via INTRAVENOUS
  Administered 2023-12-04 – 2023-12-06 (×5): 1 mg via INTRAVENOUS
  Filled 2023-12-04 (×7): qty 1

## 2023-12-04 MED ORDER — MUPIROCIN 2 % EX OINT: 1.0000 | TOPICAL_OINTMENT | Freq: Two times a day (BID) | CUTANEOUS | Status: DC

## 2023-12-04 MED ORDER — AMLODIPINE BESYLATE 10 MG PO TABS
10.0000 mg | ORAL_TABLET | Freq: Every day | ORAL | Status: DC
  Administered 2023-12-04 – 2023-12-05 (×2): 10 mg via ORAL
  Filled 2023-12-04: qty 2
  Filled 2023-12-04: qty 1

## 2023-12-04 MED ORDER — MIDAZOLAM HCL 2 MG/2ML IJ SOLN
2.0000 mg | Freq: Once | INTRAMUSCULAR | Status: AC
  Administered 2023-12-04: 2 mg via INTRAVENOUS

## 2023-12-04 MED ORDER — ACETAMINOPHEN 325 MG PO TABS
650.0000 mg | ORAL_TABLET | Freq: Four times a day (QID) | ORAL | Status: DC | PRN
  Administered 2023-12-04 – 2023-12-05 (×3): 650 mg via ORAL
  Filled 2023-12-04 (×4): qty 2

## 2023-12-04 MED ORDER — HYDROMORPHONE HCL 1 MG/ML IJ SOLN
0.5000 mg | INTRAMUSCULAR | Status: DC | PRN
  Administered 2023-12-04 (×2): 0.5 mg via INTRAVENOUS
  Filled 2023-12-04 (×2): qty 0.5

## 2023-12-04 MED ORDER — ADULT MULTIVITAMIN W/MINERALS CH
1.0000 | ORAL_TABLET | Freq: Every day | ORAL | Status: DC
  Administered 2023-12-04 – 2023-12-08 (×5): 1 via ORAL
  Filled 2023-12-04 (×5): qty 1

## 2023-12-04 MED ORDER — CARVEDILOL 3.125 MG PO TABS
3.1250 mg | ORAL_TABLET | Freq: Two times a day (BID) | ORAL | Status: DC
  Administered 2023-12-04 – 2023-12-08 (×9): 3.125 mg via ORAL
  Filled 2023-12-04 (×10): qty 1

## 2023-12-04 MED ORDER — ATORVASTATIN CALCIUM 40 MG PO TABS
80.0000 mg | ORAL_TABLET | Freq: Every evening | ORAL | Status: DC
  Administered 2023-12-04: 80 mg via ORAL
  Filled 2023-12-04: qty 2

## 2023-12-04 MED ORDER — TAMSULOSIN HCL 0.4 MG PO CAPS
0.4000 mg | ORAL_CAPSULE | Freq: Every day | ORAL | Status: DC
Start: 2023-12-04 — End: 2023-12-08
  Administered 2023-12-04 – 2023-12-08 (×5): 0.4 mg via ORAL
  Filled 2023-12-04 (×5): qty 1

## 2023-12-04 MED ORDER — INSULIN GLARGINE-YFGN 100 UNIT/ML ~~LOC~~ SOLN
5.0000 [IU] | Freq: Every day | SUBCUTANEOUS | Status: DC
  Administered 2023-12-04 – 2023-12-06 (×3): 5 [IU] via SUBCUTANEOUS
  Filled 2023-12-04 (×6): qty 0.05

## 2023-12-04 MED ORDER — INSULIN ASPART 100 UNIT/ML IJ SOLN
0.0000 [IU] | INTRAMUSCULAR | Status: DC
  Administered 2023-12-04: 3 [IU] via SUBCUTANEOUS
  Administered 2023-12-04 (×3): 2 [IU] via SUBCUTANEOUS
  Administered 2023-12-04: 6 [IU] via SUBCUTANEOUS
  Administered 2023-12-04 – 2023-12-05 (×2): 2 [IU] via SUBCUTANEOUS
  Administered 2023-12-05: 1 [IU] via SUBCUTANEOUS
  Administered 2023-12-05 (×2): 3 [IU] via SUBCUTANEOUS
  Administered 2023-12-05: 2 [IU] via SUBCUTANEOUS
  Administered 2023-12-05: 1 [IU] via SUBCUTANEOUS
  Administered 2023-12-06: 2 [IU] via SUBCUTANEOUS
  Administered 2023-12-06 (×3): 3 [IU] via SUBCUTANEOUS
  Administered 2023-12-06: 2 [IU] via SUBCUTANEOUS
  Administered 2023-12-07 (×2): 4 [IU] via SUBCUTANEOUS
  Administered 2023-12-07: 1 [IU] via SUBCUTANEOUS
  Administered 2023-12-07: 3 [IU] via SUBCUTANEOUS
  Filled 2023-12-04 (×3): qty 1

## 2023-12-04 MED ORDER — MIDAZOLAM HCL 2 MG/2ML IJ SOLN
INTRAMUSCULAR | Status: AC
Start: 2023-12-04 — End: 2023-12-04
  Filled 2023-12-04: qty 2

## 2023-12-04 MED ORDER — ISOSORBIDE DINITRATE 5 MG PO TABS
5.0000 mg | ORAL_TABLET | Freq: Two times a day (BID) | ORAL | Status: DC
  Administered 2023-12-04 – 2023-12-08 (×6): 5 mg via ORAL
  Filled 2023-12-04 (×13): qty 1

## 2023-12-04 MED ORDER — SODIUM CHLORIDE 0.9 % IV SOLN: INTRAVENOUS | Status: DC

## 2023-12-04 MED ORDER — MIRTAZAPINE 15 MG PO TABS
7.5000 mg | ORAL_TABLET | Freq: Every day | ORAL | Status: DC
  Administered 2023-12-04 – 2023-12-07 (×5): 7.5 mg via ORAL
  Filled 2023-12-04 (×5): qty 1

## 2023-12-04 MED ORDER — FENTANYL CITRATE PF 50 MCG/ML IJ SOSY
50.0000 ug | PREFILLED_SYRINGE | Freq: Once | INTRAMUSCULAR | Status: AC
  Administered 2023-12-04: 50 ug via INTRAVENOUS

## 2023-12-04 NOTE — Anesthesia Procedure Notes (Signed)
 Anesthesia Regional Block: Peng block   Pre-Anesthetic Checklist: , timeout performed,  Correct Patient, Correct Site, Correct Laterality,  Correct Procedure, Correct Position, site marked,  Risks and benefits discussed,  Surgical consent,  Pre-op evaluation,  At surgeon's request and post-op pain management  Laterality: Left  Prep: chloraprep       Needles:  Injection technique: Single-shot  Needle Type: Echogenic Stimulator Needle     Needle Length: 5cm  Needle Gauge: 22     Additional Needles:   Procedures:,,,, ultrasound used (permanent image in chart),,    Narrative:  Start time: 12/04/2023 3:05 PM End time: 12/04/2023 3:09 PM Injection made incrementally with aspirations every 5 mL.  Performed by: Personally  Anesthesiologist: Mallory Manus, MD  Additional Notes: Functioning IV was confirmed and monitors were applied.  A 50mm 22ga Arrow echogenic stimulator needle was used. Sterile prep and drape,hand hygiene and sterile gloves were used. Ultrasound guidance: relevant anatomy identified, needle position confirmed, local anesthetic spread visualized around nerve(s)., vascular puncture avoided.  Image printed for medical record. Negative aspiration and negative test dose prior to incremental administration of local anesthetic. The patient tolerated the procedure well. NO PICK

## 2023-12-04 NOTE — ED Notes (Signed)
 [facility] here to pick up patient for transport.

## 2023-12-04 NOTE — Anesthesia Preprocedure Evaluation (Addendum)
 Anesthesia Evaluation  Patient identified by MRN, date of birth, ID band Patient awake    Reviewed: Allergy & Precautions, NPO status , Patient's Chart, lab work & pertinent test results  Airway Mallampati: III  TM Distance: >3 FB Neck ROM: Full    Dental  (+) Poor Dentition, Missing, Loose, Dental Advisory Given   Pulmonary former smoker    + decreased breath sounds      Cardiovascular hypertension, Pt. on medications and Pt. on home beta blockers + CAD, + Past MI, + Cardiac Stents, + CABG, + Peripheral Vascular Disease and +CHF  + dysrhythmias Ventricular Tachycardia and Ventricular Fibrillation + Cardiac Defibrillator  Rhythm:Regular Rate:Normal  '23 ECHO: EF 30 to 35%.  1.The LV has moderately decreased function, no regional wall motion abnormalities. The left ventricular internal cavity size was moderately dilated. Left ventricular diastolic  parameters were normal.   2. RVF is normal. The right ventricular size is normal.   3. Left atrial size was mildly dilated.   4. The mitral valve is abnormal. No evidence of mitral valve regurgitation. No evidence of mitral stenosis.   5. The aortic valve is tricuspid. There is mild calcification of the aortic valve. Aortic valve regurgitation is not visualized. Aortic valve sclerosis is present, with no evidence of aortic valve stenosis.     Neuro/Psych  PSYCHIATRIC DISORDERS Anxiety Depression    negative neurological ROS     GI/Hepatic negative GI ROS, Neg liver ROS,,,  Endo/Other  diabetes, Insulin  Dependent  semaglutide   Renal/GU Renal InsufficiencyRenal disease     Musculoskeletal  (+) Arthritis ,    Abdominal   Peds  Hematology negative hematology ROS (+) Plavix  Hb 13.2, plt 199k   Anesthesia Other Findings   Reproductive/Obstetrics                              Anesthesia Physical Anesthesia Plan  ASA: 4  Anesthesia Plan: General    Post-op Pain Management: Tylenol  PO (pre-op)*   Induction: Intravenous  PONV Risk Score and Plan: 2 and Ondansetron  and Dexamethasone   Airway Management Planned: Oral ETT  Additional Equipment: Arterial line  Intra-op Plan:   Post-operative Plan: Extubation in OR  Informed Consent: I have reviewed the patients History and Physical, chart, labs and discussed the procedure including the risks, benefits and alternatives for the proposed anesthesia with the patient or authorized representative who has indicated his/her understanding and acceptance.     Dental advisory given  Plan Discussed with:   Anesthesia Plan Comments:          Anesthesia Quick Evaluation

## 2023-12-04 NOTE — Progress Notes (Signed)
 PROGRESS NOTE    Douglas Carr  FMW:[id number] DOB: 01/11/1966 DOA: 12/03/2023 PCP: [name], [name], MD   Brief Narrative:    Douglas Carr is a 58 y.o. male with medical history significant for hypertension, insulin -dependent diabetes mellitus, CAD status post stents and CABG, chronic HFrEF, and CKD 3A who presents with severe left leg pain after a fall.  Patient has been admitted for left hip fracture.  He is awaiting transfer to [facility].  He was noted to have some urinary retention this morning requiring in and out catheterization.  Assessment & Plan:   Principal Problem:   Closed left hip fracture, initial encounter [facility]) Active Problems:   Coronary artery disease involving native coronary artery of native heart with angina pectoris (HCC)   Type 2 diabetes mellitus with obesity (HCC)   Acute renal failure superimposed on stage 3a chronic kidney disease (HCC)   Chronic HFrEF (heart failure with reduced ejection fraction) (HCC)   Prolonged QT interval  Assessment and Plan:  1. Left hip fracture  - Based on the available data, Mr. Early presents an estimated 0.96% risk of perioperative MI or cardiac arrest; no further preoperative cardiac evaluation is indicated  - Continue pain-control, hold antiplatelets, keep NPO   -Transfer to [facility] for orthopedic evaluation and management.   2. AKI superimposed on CKD 3A  - SCr is 2.09, up from apparent baseline closer to 1.6    - Check UA and urine chemistries, hydrate with IVF overnight, renally-dose medications, repeat serum chemistries in am     3. CAD  - Hx of CABG  - No recent angina  - Hold ASA and Plavix  in anticipation of surgery, continue beta-blocker, statin, and nitrates    4. Chronic HFrEF  - Appears compensated  - Monitor weight and I/Os, continue Coreg      5. Insulin -dependent DM  - A1c 13.1% - Check CBGs, continue long-acting insulin  with dose-reduction given AKI and NPO, add SSI for now      6. Prolonged QT interval  - Check magnesium  level, avoid QT-prolonging medications    7.  Urinary retention with noted BPH - Continue tamsulosin  and required in and out catheterization this morning - Over 1 L noted on bladder scan    DVT prophylaxis: SCDs Code Status: Full Family Communication: None at bedside Disposition Plan:  Status is: Inpatient Remains inpatient appropriate because: Need for inpatient procedure and IV medications.   Consultants:  EDP discussed with orthopedics Dr. Fidel  Procedures:  None  Antimicrobials:  None   Subjective: Patient seen and evaluated today with no new acute complaints or concerns.  Denies any significant pain. Noted to have significant urinary retention of over 1 L requiring catheterization this a.m.  Objective: Vitals:   12/04/23 0400 12/04/23 0442 12/04/23 0500 12/04/23 0600  BP: (!) 192/85  128/62 (!) 142/44  Pulse: 91  79 88  Resp: (!) 23  19 19   Temp:  98 F (36.7 C)    TempSrc:      SpO2: 100%  95% 95%  Weight:      Height:       No intake or output data in the 24 hours ending 12/04/23 0716 Filed Weights   12/03/23 1514  Weight: 91.6 kg    Examination:  General exam: Appears calm and comfortable  Respiratory system: Clear to auscultation. Respiratory effort normal.  Currently on nasal cannula Cardiovascular system: S1 & S2 heard, RRR.  Gastrointestinal system: Abdomen is soft Central  nervous system: Alert and awake Extremities: No edema Skin: Noted abrasions with dressings present. Psychiatry: Flat affect.    Data Reviewed: I have personally reviewed following labs and imaging studies  CBC: Recent Labs  Lab 12/03/23 1302 12/03/23 1615 12/04/23 0248  WBC 8.2 17.5* 10.6*  NEUTROABS 5.0 14.1*  --   HGB 15.3 14.7 13.2  HCT 48.2 44.4 39.7  MCV 85 81.6 81.4  PLT 220 239 199   Basic Metabolic Panel: Recent Labs  Lab 12/03/23 1302 12/03/23 1615 12/04/23 0248  NA 136 135 136  K 5.5* 4.5 4.7   CL 99 100 104  CO2 22 20* 23  GLUCOSE 344* 394* 318*  BUN 28* 30* 31*  CREATININE 2.00* 2.09* 2.02*  CALCIUM  9.1 8.9 8.2*  MG  --   --  2.2   GFR: Estimated Creatinine Clearance: 45.9 mL/min (A) (by C-G formula based on SCr of 2.02 mg/dL (H)). Liver Function Tests: Recent Labs  Lab 12/03/23 1302 12/03/23 1615  AST 10 21  ALT 9 13  ALKPHOS 125* 106  BILITOT <0.2 0.7  PROT 6.4 7.2  ALBUMIN  3.5* 3.2*   No results for input(s): LIPASE, AMYLASE in the last 168 hours. No results for input(s): AMMONIA in the last 168 hours. Coagulation Profile: Recent Labs  Lab 12/03/23 1615  INR 0.9   Cardiac Enzymes: No results for input(s): CKTOTAL, CKMB, CKMBINDEX, TROPONINI in the last 168 hours. BNP (last 3 results) No results for input(s): PROBNP in the last 8760 hours. HbA1C: Recent Labs    12/03/23 1301  HGBA1C 13.1*   CBG: Recent Labs  Lab 12/04/23 0122 12/04/23 0432  GLUCAP 366* 233*   Lipid Profile: Recent Labs    12/03/23 1302  CHOL 430*  HDL 31*  LDLCALC 259*  TRIG 572*  CHOLHDL 13.9*   Thyroid Function Tests: No results for input(s): TSH, T4TOTAL, FREET4, T3FREE, THYROIDAB in the last 72 hours. Anemia Panel: No results for input(s): VITAMINB12, FOLATE, FERRITIN, TIBC, IRON, RETICCTPCT in the last 72 hours. Sepsis Labs: No results for input(s): PROCALCITON, LATICACIDVEN in the last 168 hours.  No results found for this or any previous visit (from the past 240 hours).       Radiology Studies: DG Knee Left Port Result Date: 12/03/2023 CLINICAL DATA:  Fall EXAM: PORTABLE LEFT KNEE - 1-2 VIEW COMPARISON:  None Available. FINDINGS: No evidence of fracture, dislocation, or joint effusion. There are mild degenerative osteophytes of the knee with mild medial compartment joint space narrowing. Peripheral vascular calcifications are present. Tissues otherwise within normal limits. IMPRESSION: 1. No acute fracture or  dislocation. 2. Mild degenerative changes. Electronically Signed   By: Greig Pique M.D.   On: 12/03/2023 21:13   DG Chest Portable 1 View Result Date: 12/03/2023 CLINICAL DATA:  Weakness EXAM: PORTABLE CHEST 1 VIEW COMPARISON:  05/26/2021 FINDINGS: Multiple benign calcified granuloma are seen within the right lung. Lungs are otherwise clear. No pneumothorax or pleural effusion. Coronary artery bypass grafting has been performed. Cardiac size within limits. Left subclavian single lead pacemaker defibrillator is unchanged. Pulmonary vascularity is normal. No acute bone abnormality IMPRESSION: 1. No active disease. Electronically Signed   By: Dorethia Molt M.D.   On: 12/03/2023 19:58   DG HIP UNILAT WITH PELVIS 2-3 VIEWS LEFT Result Date: 12/03/2023 CLINICAL DATA:  Pain after a fall EXAM: DG HIP (WITH OR WITHOUT PELVIS) 2-3V LEFT COMPARISON:  CT abdomen and pelvis 06/11/2016 FINDINGS: Transverse fracture through the left femoral neck with varus angulation  of the proximal femur. No dislocation at the hip joint. No inter trochanteric involvement is identified. Degenerative changes in the left hip as well as in the right hip and lower lumbar spine. Visualized pelvis appears intact. IMPRESSION: Transverse fracture of the left femoral neck with varus angulation. Electronically Signed   By: Elsie Gravely M.D.   On: 12/03/2023 19:17   DG Elbow Complete Left Result Date: 12/03/2023 CLINICAL DATA:  Pain after a fall.  Skin lacerations. EXAM: LEFT ELBOW - COMPLETE 3+ VIEW COMPARISON:  None Available. FINDINGS: Surgical clip in the antecubital fossa. Soft tissues are otherwise unremarkable. No radiopaque foreign bodies or soft tissue gas. Bones appear intact. No evidence of acute fracture or dislocation. No focal bone lesion or bone destruction. No significant effusion. IMPRESSION: No acute bony abnormalities. Electronically Signed   By: Elsie Gravely M.D.   On: 12/03/2023 19:16        Scheduled Meds:   amLODipine   10 mg Oral Daily   atorvastatin   80 mg Oral QPM   carvedilol   3.125 mg Oral Q12H   insulin  aspart  0-6 Units Subcutaneous Q4H   insulin  glargine-yfgn  5 Units Subcutaneous QHS   isosorbide  dinitrate  5 mg Oral BID   mirtazapine   7.5 mg Oral QHS   tamsulosin   0.4 mg Oral Daily   Continuous Infusions:  sodium chloride  100 mL/hr at 12/04/23 0247     LOS: 1 day    Time spent: 55 minutes    Oreste Majeed D Maree, DO [facility]  If 7PM-7AM, please contact night-coverage [email or web address] 12/04/2023, 7:16 AM

## 2023-12-04 NOTE — Anesthesia Preprocedure Evaluation (Addendum)
 Anesthesia Evaluation  Patient identified by MRN, date of birth, ID band Patient awake    Reviewed: Allergy & Precautions, H&P , NPO status , Patient's Chart, lab work & pertinent test results  History of Anesthesia Complications Negative for: history of anesthetic complications  Airway Mallampati: II  TM Distance: >3 FB Neck ROM: Full    Dental no notable dental hx. (+) Poor Dentition   Pulmonary former smoker   Pulmonary exam normal breath sounds clear to auscultation       Cardiovascular hypertension, Pt. on medications and Pt. on home beta blockers + CAD, + Past MI, + Cardiac Stents, + CABG, + Peripheral Vascular Disease and +CHF  Normal cardiovascular exam+ dysrhythmias Ventricular Tachycardia and Ventricular Fibrillation + Cardiac Defibrillator  Rhythm:Regular Rate:Normal  '23 ECHO: EF 30 to 35%.  1.The LV has moderately decreased function, no regional wall motion abnormalities. The left ventricular internal cavity size was moderately dilated. Left ventricular diastolic  parameters were normal.   2. RVF is normal. The right ventricular size is normal.   3. Left atrial size was mildly dilated.   4. The mitral valve is abnormal. No evidence of mitral valve regurgitation. No evidence of mitral stenosis.   5. The aortic valve is tricuspid. There is mild calcification of the aortic valve. Aortic valve regurgitation is not visualized. Aortic valve sclerosis is present, with no evidence of aortic valve stenosis.     Neuro/Psych   Anxiety     negative neurological ROS  negative psych ROS   GI/Hepatic negative GI ROS, Neg liver ROS,,,  Endo/Other  diabetes, Insulin  Dependent  semaglutide   Renal/GU Renal diseasenegative Renal ROS  negative genitourinary   Musculoskeletal negative musculoskeletal ROS (+)  Left femoral neck fracture   Abdominal   Peds negative pediatric ROS (+)  Hematology negative hematology ROS (+)  Plavix  Hb 13.2, plt 199k   Anesthesia Other Findings Day of surgery medications reviewed with patient.  Reproductive/Obstetrics negative OB ROS                              Anesthesia Physical Anesthesia Plan  ASA: 4  Anesthesia Plan: Regional   Post-op Pain Management: Regional block*   Induction:   PONV Risk Score and Plan: Treatment may vary due to age or medical condition  Airway Management Planned: Natural Airway  Additional Equipment: None  Intra-op Plan:   Post-operative Plan:   Informed Consent:   Plan Discussed with: Anesthesiologist  Anesthesia Plan Comments:          Anesthesia Quick Evaluation

## 2023-12-04 NOTE — Progress Notes (Signed)
   12/04/23 1450  TOC Brief Assessment  Insurance and Status Reviewed  Patient has primary care physician Yes  Home environment has been reviewed Single family home  Prior level of function: Independent  Prior/Current Home Services No current home services  Social Drivers of Health Review SDOH reviewed no interventions necessary  Readmission risk has been reviewed Yes  Transition of care needs transition of care needs identified, TOC will continue to follow

## 2023-12-04 NOTE — ED Notes (Signed)
 Patient informed this nurse he has not urinated since yesterday, Patient had urinal and still unable to urinate. Patient bladder scanned and 1153 ml noted. MD notified.

## 2023-12-04 NOTE — Consult Note (Signed)
 Reason for Consult:Left hip fx Referring Physician: [name] Time called: 1236 Time at bedside: 1254   Douglas Carr is an 58 y.o. male.  HPI: Douglas Carr was helping his son push a truck when it got away from him and he fell. He had immediate left hip pain and could not get up. He was brought to [facility] where x-rays showed a left hip fx. Orthopedic surgery was consulted and recommended transfer to [facility] for definitive care. He lives at home alone and does not use any assistive devices to ambulate and is retired.  Past Medical History:  Diagnosis Date   Acute ST elevation myocardial infarction (STEMI) involving left circumflex coronary artery (HCC) 05/07/2016   PCI to Cx-OM   Anginal pain (HCC)    secondary to sm. 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 05/13/2016   Coronary artery disease involving native coronary artery of native heart with angina pectoris (HCC) 05/24/2016   Remote MI at 58 years of age, Last cath 2010-diffuse non-obstructive disease; last echo 06/16/08 -normal LV function, moderate concentric hypertrophy; nuc 08/2008 no ischemia;  medical therapy; STEMI May 07 2016 - PCI to Cx-OM   Diabetes mellitus    ON ORAL MEDICATION AND INSULIN    Dilated cardiomyopathy (HCC) 05/24/2016   EF 325-30% by Echo post STEMI (previously 30-35%)    Hyperlipidemia    Hypertension    Myocardial infarction (HCC) 1996   Post MI   Peripheral vascular disease (HCC)    HAS LEFT CAROTID ARTERY STENOSIS   AND IS S/P RIGHT CAROTID ENDARTERECTOMY 2010 Last carotid dopplers 01/08/2012 wth patent endarterectomy site   Status post coronary artery stent placement     Past Surgical History:  Procedure Laterality Date   APPENDECTOMY     CARDIAC CATHETERIZATION     FEB 2010, significant branch vessel disease wth diag,  marginal, PDA & PLA, nml. LV function   CARDIAC CATHETERIZATION N/A 05/07/2016   Procedure: Left Heart Cath and Coronary Angiography;  Surgeon: Peter M Swaziland, MD;  Location: [facility] INVASIVE CV LAB;  Service: Cardiovascular;  Laterality: N/A;   CARDIAC CATHETERIZATION N/A 05/07/2016   Procedure: Coronary Stent Intervention;  Surgeon: Peter M Swaziland, MD;  Location: [facility] INVASIVE CV LAB;  Service: Cardiovascular;  Laterality: N/A;   CARDIAC CATHETERIZATION N/A 05/24/2016   Procedure: Left Heart Cath and Coronary Angiography;  Surgeon: Alm LELON Clay, MD;  Location: [facility] INVASIVE CV LAB;  Service: Cardiovascular;  Laterality: N/A;   CARDIAC CATHETERIZATION N/A 05/24/2016   Procedure: Coronary Stent Intervention;  Surgeon: Alm LELON Clay, MD;  Location: [facility] INVASIVE CV LAB;  Service: Cardiovascular;  Laterality: N/A;  2.5x20 Promus to Ostial/proximal circumflex   CAROTID ENDARTERECTOMY  09/2008   Rt CEA   CLIPPING OF ATRIAL APPENDAGE Left 01/27/2020   Procedure: CLIPPING OF ATRIAL APPENDAGE USING ATRICURE 35 MM ATRICLIP;  Surgeon: German Bartlett PEDLAR, MD;  Location: [facility] OR;  Service: Open Heart Surgery;  Laterality: Left;   CORONARY ARTERY BYPASS GRAFT N/A 01/27/2020   Procedure: CORONARY ARTERY BYPASS GRAFTING (CABG), ON PUMP, TIMES FIVE, USING BILATERAL MAMMARY ARTERIES, LEFT RADIAL ARTERY, AND ENDOSCOPICALLY HARVESTED RIGHT GREATER SAPHENOUS VEIN.;  Surgeon: German Bartlett PEDLAR, MD;  Location: [facility] OR;  Service: Open Heart Surgery;  Laterality:  N/A;  LIMA->LAD RIMA->D1 (free graft) LRA-> OM SVG-> PDA->PL   CORONARY PRESSURE/FFR STUDY N/A 01/24/2020   Procedure: INTRAVASCULAR PRESSURE WIRE/FFR STUDY;  Surgeon: Claudene Victory ORN, MD;  Location: [facility] INVASIVE CV LAB;  Service: Cardiovascular;  Laterality: N/A;   ENDOVEIN HARVEST OF GREATER SAPHENOUS VEIN Right 01/27/2020   Procedure: ENDOVEIN HARVEST OF GREATER SAPHENOUS VEIN;  Surgeon: German Bartlett PEDLAR, MD;  Location: [facility] OR;  Service: Open Heart Surgery;  Laterality: Right;    ICD IMPLANT N/A 06/30/2016   Procedure: ICD Implant;  Surgeon: Will Gladis Norton, MD;  Location: [facility] INVASIVE CV LAB;  Service: Cardiovascular;  Laterality: N/A;   LUNG BIOPSY  2013   Bx's suggest granulomatous dz.   RADIAL ARTERY HARVEST Left 01/27/2020   Procedure: RADIAL ARTERY HARVEST;  Surgeon: German Bartlett PEDLAR, MD;  Location: [facility] OR;  Service: Open Heart Surgery;  Laterality: Left;   RIGHT/LEFT HEART CATH AND CORONARY ANGIOGRAPHY N/A 01/24/2020   Procedure: RIGHT/LEFT HEART CATH AND CORONARY ANGIOGRAPHY;  Surgeon: Claudene Victory ORN, MD;  Location: [facility] INVASIVE CV LAB;  Service: Cardiovascular;  Laterality: N/A;   RT ANKLE   2013   TEE WITHOUT CARDIOVERSION N/A 01/27/2020   Procedure: TRANSESOPHAGEAL ECHOCARDIOGRAM (TEE);  Surgeon: German Bartlett PEDLAR, MD;  Location: [facility] OR;  Service: Open Heart Surgery;  Laterality: N/A;    Family History  Problem Relation Age of Onset   Hypertension Mother    Diabetes Mother    Hypertension Maternal Grandfather    Heart attack Paternal Grandfather    Heart attack Father 18    Social History:  reports that he quit smoking about 12 years ago. His smoking use included cigarettes. He started smoking about 34 years ago. He has a 44 pack-year smoking history. His smokeless tobacco use includes snuff. He reports that he does not drink alcohol and does not use drugs.  Allergies: No Known Allergies  Medications: I have reviewed the patient's current medications.  Results for orders placed or performed during the hospital encounter of 12/03/23 (from the past 48 hours)  CBC with Differential     Status: Abnormal   Collection Time: 12/03/23  4:15 PM  Result Value Ref Range   WBC 17.5 (H) 4.0 - 10.5 K/uL   RBC 5.44 4.22 - 5.81 MIL/uL   Hemoglobin 14.7 13.0 - 17.0 g/dL   HCT 55.5 60.9 - 47.9 %   MCV 81.6 80.0 - 100.0 fL   MCH 27.0 26.0 - 34.0 pg   MCHC 33.1 30.0 - 36.0 g/dL   RDW 86.1 88.4 - 84.4 %   Platelets 239 150 - 400 K/uL   nRBC 0.0 0.0 - 0.2 %    Neutrophils Relative % 81 %   Neutro Abs 14.1 (H) 1.7 - 7.7 K/uL   Lymphocytes Relative 12 %   Lymphs Abs 2.1 0.7 - 4.0 K/uL   Monocytes Relative 6 %   Monocytes Absolute 1.1 (H) 0.1 - 1.0 K/uL   Eosinophils Relative 0 %   Eosinophils Absolute 0.1 0.0 - 0.5 K/uL   Basophils Relative 0 %   Basophils Absolute 0.1 0.0 - 0.1 K/uL   Immature Granulocytes 1 %   Abs Immature Granulocytes 0.10 (H) 0.00 - 0.07 K/uL    Comment: Performed at [facility], [address], [place], [place] [address]  Comprehensive metabolic panel     Status: Abnormal   Collection Time: 12/03/23  4:15 PM  Result Value Ref Range   Sodium 135 135 - 145 mmol/L  Potassium 4.5 3.5 - 5.1 mmol/L   Chloride 100 98 - 111 mmol/L   CO2 20 (L) 22 - 32 mmol/L   Glucose, Bld 394 (H) 70 - 99 mg/dL    Comment: Glucose reference range applies only to samples taken after fasting for at least 8 hours.   BUN 30 (H) 6 - 20 mg/dL   Creatinine, Ser 7.90 (H) 0.61 - 1.24 mg/dL   Calcium  8.9 8.9 - 10.3 mg/dL   Total Protein 7.2 6.5 - 8.1 g/dL   Albumin  3.2 (L) 3.5 - 5.0 g/dL   AST 21 15 - 41 U/L   ALT 13 0 - 44 U/L   Alkaline Phosphatase 106 38 - 126 U/L   Total Bilirubin 0.7 0.0 - 1.2 mg/dL   GFR, Estimated 36 (L) >60 mL/min    Comment: (NOTE) Calculated using the CKD-EPI Creatinine Equation (2021)    Anion gap 15 5 - 15    Comment: Performed at [facility], [address], [place], [place] [address]  Protime-INR     Status: None   Collection Time: 12/03/23  4:15 PM  Result Value Ref Range   Prothrombin Time 12.5 11.4 - 15.2 seconds   INR 0.9 0.8 - 1.2    Comment: (NOTE) INR goal varies based on device and disease states. Performed at [facility], [address], [place], [place] [address]   CBG monitoring, ED     Status: Abnormal   Collection Time: 12/04/23  1:22 AM  Result Value Ref Range   Glucose-Capillary 366 (H) 70 - 99 mg/dL    Comment: Glucose reference range applies only to samples taken after fasting for at  least 8 hours.  CBC     Status: Abnormal   Collection Time: 12/04/23  2:48 AM  Result Value Ref Range   WBC 10.6 (H) 4.0 - 10.5 K/uL   RBC 4.88 4.22 - 5.81 MIL/uL   Hemoglobin 13.2 13.0 - 17.0 g/dL   HCT 60.2 60.9 - 47.9 %   MCV 81.4 80.0 - 100.0 fL   MCH 27.0 26.0 - 34.0 pg   MCHC 33.2 30.0 - 36.0 g/dL   RDW 86.0 88.4 - 84.4 %   Platelets 199 150 - 400 K/uL   nRBC 0.0 0.0 - 0.2 %    Comment: Performed at [facility], [address], [place], [place] [address]  Basic metabolic panel     Status: Abnormal   Collection Time: 12/04/23  2:48 AM  Result Value Ref Range   Sodium 136 135 - 145 mmol/L   Potassium 4.7 3.5 - 5.1 mmol/L   Chloride 104 98 - 111 mmol/L   CO2 23 22 - 32 mmol/L   Glucose, Bld 318 (H) 70 - 99 mg/dL    Comment: Glucose reference range applies only to samples taken after fasting for at least 8 hours.   BUN 31 (H) 6 - 20 mg/dL   Creatinine, Ser 7.97 (H) 0.61 - 1.24 mg/dL   Calcium  8.2 (L) 8.9 - 10.3 mg/dL   GFR, Estimated 38 (L) >60 mL/min    Comment: (NOTE) Calculated using the CKD-EPI Creatinine Equation (2021)    Anion gap 9 5 - 15    Comment: Performed at [facility], [address] [place], [place] [address]  Magnesium      Status: None   Collection Time: 12/04/23  2:48 AM  Result Value Ref Range   Magnesium  2.2 1.7 - 2.4 mg/dL    Comment: Performed at [facility], [address], [place], [place] [address]  CBG monitoring, ED     Status: Abnormal   Collection Time: 12/04/23  4:32 AM  Result Value Ref Range   Glucose-Capillary 233 (H) 70 - 99 mg/dL    Comment: Glucose reference range applies only to samples taken after fasting for at least 8 hours.  CBG monitoring, ED     Status: Abnormal   Collection Time: 12/04/23  8:43 AM  Result Value Ref Range   Glucose-Capillary 244 (H) 70 - 99 mg/dL    Comment: Glucose reference range applies only to samples taken after fasting for at least 8 hours.  Urinalysis, Complete w Microscopic -Urine, Clean Catch      Status: Abnormal   Collection Time: 12/04/23  9:28 AM  Result Value Ref Range   Color, Urine YELLOW YELLOW   APPearance CLEAR CLEAR   Specific Gravity, Urine 1.018 1.005 - 1.030   pH 5.0 5.0 - 8.0   Glucose, UA >=500 (A) NEGATIVE mg/dL   Hgb urine dipstick SMALL (A) NEGATIVE   Bilirubin Urine NEGATIVE NEGATIVE   Ketones, ur NEGATIVE NEGATIVE mg/dL   Protein, ur >=699 (A) NEGATIVE mg/dL   Nitrite NEGATIVE NEGATIVE   Leukocytes,Ua NEGATIVE NEGATIVE   RBC / HPF 0-5 0 - 5 RBC/hpf   WBC, UA 0-5 0 - 5 WBC/hpf   Bacteria, UA RARE (A) NONE SEEN   Squamous Epithelial / HPF 0-5 0 - 5 /HPF   Mucus PRESENT     Comment: Performed at [facility], [address], [place], [place] [address]  Sodium, urine, random     Status: None   Collection Time: 12/04/23  9:28 AM  Result Value Ref Range   Sodium, Ur 31 mmol/L    Comment: Performed at [facility], [address] [place], [place] [address]  Creatinine, urine, random     Status: None   Collection Time: 12/04/23  9:28 AM  Result Value Ref Range   Creatinine, Urine 117 mg/dL    Comment: Performed at [facility], [address] [place], [place] [address]  Glucose, capillary     Status: Abnormal   Collection Time: 12/04/23 11:47 AM  Result Value Ref Range   Glucose-Capillary 242 (H) 70 - 99 mg/dL    Comment: Glucose reference range applies only to samples taken after fasting for at least 8 hours.    DG Knee Left Port Result Date: 12/03/2023 CLINICAL DATA:  Fall EXAM: PORTABLE LEFT KNEE - 1-2 VIEW COMPARISON:  None Available. FINDINGS: No evidence of fracture, dislocation, or joint effusion. There are mild degenerative osteophytes of the knee with mild medial compartment joint space narrowing. Peripheral vascular calcifications are present. Tissues otherwise within normal limits. IMPRESSION: 1. No acute fracture or dislocation. 2. Mild degenerative changes. Electronically Signed   By: Greig Pique M.D.   On: 12/03/2023 21:13   DG Chest Portable  1 View Result Date: 12/03/2023 CLINICAL DATA:  Weakness EXAM: PORTABLE CHEST 1 VIEW COMPARISON:  05/26/2021 FINDINGS: Multiple benign calcified granuloma are seen within the right lung. Lungs are otherwise clear. No pneumothorax or pleural effusion. Coronary artery bypass grafting has been performed. Cardiac size within limits. Left subclavian single lead pacemaker defibrillator is unchanged. Pulmonary vascularity is normal. No acute bone abnormality IMPRESSION: 1. No active disease. Electronically Signed   By: Dorethia Molt M.D.   On: 12/03/2023 19:58   DG HIP UNILAT WITH PELVIS 2-3 VIEWS LEFT Result Date: 12/03/2023 CLINICAL DATA:  Pain after a fall EXAM:  DG HIP (WITH OR WITHOUT PELVIS) 2-3V LEFT COMPARISON:  CT abdomen and pelvis 06/11/2016 FINDINGS: Transverse fracture through the left femoral neck with varus angulation of the proximal femur. No dislocation at the hip joint. No inter trochanteric involvement is identified. Degenerative changes in the left hip as well as in the right hip and lower lumbar spine. Visualized pelvis appears intact. IMPRESSION: Transverse fracture of the left femoral neck with varus angulation. Electronically Signed   By: Elsie Gravely M.D.   On: 12/03/2023 19:17   DG Elbow Complete Left Result Date: 12/03/2023 CLINICAL DATA:  Pain after a fall.  Skin lacerations. EXAM: LEFT ELBOW - COMPLETE 3+ VIEW COMPARISON:  None Available. FINDINGS: Surgical clip in the antecubital fossa. Soft tissues are otherwise unremarkable. No radiopaque foreign bodies or soft tissue gas. Bones appear intact. No evidence of acute fracture or dislocation. No focal bone lesion or bone destruction. No significant effusion. IMPRESSION: No acute bony abnormalities. Electronically Signed   By: Elsie Gravely M.D.   On: 12/03/2023 19:16    Review of Systems  HENT:  Negative for ear discharge, ear pain, hearing loss and tinnitus.   Eyes:  Negative for photophobia and pain.  Respiratory:  Negative  for cough and shortness of breath.   Cardiovascular:  Negative for chest pain.  Gastrointestinal:  Negative for abdominal pain, nausea and vomiting.  Genitourinary:  Negative for dysuria, flank pain, frequency and urgency.  Musculoskeletal:  Positive for arthralgias (Left hip). Negative for back pain, myalgias and neck pain.  Neurological:  Negative for dizziness and headaches.  Hematological:  Does not bruise/bleed easily.  Psychiatric/Behavioral:  The patient is not nervous/anxious.    Blood pressure (!) 122/55, pulse 74, temperature 98 F (36.7 C), temperature source Oral, resp. rate 15, height 5' 10 (1.778 m), weight 91.6 kg, SpO2 97%. Physical Exam Constitutional:      General: He is not in acute distress.    Appearance: He is well-developed. He is not diaphoretic.  HENT:     Head: Normocephalic and atraumatic.  Eyes:     General: No scleral icterus.       Right eye: No discharge.        Left eye: No discharge.     Conjunctiva/sclera: Conjunctivae normal.  Cardiovascular:     Rate and Rhythm: Normal rate and regular rhythm.  Pulmonary:     Effort: Pulmonary effort is normal. No respiratory distress.  Musculoskeletal:     Cervical back: Normal range of motion.     Comments: LLE No traumatic wounds, ecchymosis, or rash  Mod TTP hip  No knee or ankle effusion  Knee stable to varus/ valgus and anterior/posterior stress  Sens DPN, SPN, TN intact  Motor EHL, ext, flex, evers 5/5  DP 2+, PT 2+, No significant edema  Skin:    General: Skin is warm and dry.  Neurological:     Mental Status: He is alert.  Psychiatric:        Mood and Affect: Mood normal.        Behavior: Behavior normal.     Assessment/Plan: Left hip fx -- Plan THA tomorrow with Dr. Fidel. Please keep NPO after MN.    Ozell DOROTHA Ned, PA-C Orthopedic Surgery [phone] 12/04/2023, 1:00 PM
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
## 2023-12-04 NOTE — Inpatient Diabetes Management (Signed)
 Inpatient Diabetes Program Recommendations  AACE/ADA: New Consensus Statement on Inpatient Glycemic Control   Target Ranges:  Prepandial:   less than 140 mg/dL      Peak postprandial:   less than 180 mg/dL (1-2 hours)      Critically ill patients:  140 - 180 mg/dL    Latest Reference Range & Units 12/04/23 01:22 12/04/23 04:32 12/04/23 08:43  Glucose-Capillary 70 - 99 mg/dL 633 (H) 766 (H) 755 (H)    Latest Reference Range & Units 12/03/23 13:02 12/03/23 16:15 12/04/23 02:48  Glucose 70 - 99 mg/dL 655 (H) 605 (H) 681 (H)   Review of Glycemic Control  Diabetes history: DM2 Outpatient Diabetes medications: Lantus  11 units at bedtime, Ozmepic 0.25 mg Qweek Current orders for Inpatient glycemic control: Semglee  5 units at bedtime, Novolog  0-6 units Q4H  Inpatient Diabetes Program Recommendations:    Insulin : CBG 244 mg/dl this morning. Noted NPO this am. Please consider changing frequency of Semglee  5 units to daily to start now and increase SSI to Novolog  0-9 units Q4H.  HbgA1C: Please consider ordering an A1C to evaluate glycemic control over the past 2-3 months.  Thanks, Earnie Gainer, RN, MSN, CDCES Diabetes Coordinator Inpatient Diabetes Program [phone] (Team Pager from 8am to 5pm)
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
## 2023-12-05 DIAGNOSIS — I739 Peripheral vascular disease, unspecified: Secondary | ICD-10-CM

## 2023-12-05 DIAGNOSIS — I5042 Chronic combined systolic (congestive) and diastolic (congestive) heart failure: Secondary | ICD-10-CM

## 2023-12-05 DIAGNOSIS — E7800 Pure hypercholesterolemia, unspecified: Secondary | ICD-10-CM

## 2023-12-05 DIAGNOSIS — Z01810 Encounter for preprocedural cardiovascular examination: Secondary | ICD-10-CM

## 2023-12-05 DIAGNOSIS — I2510 Atherosclerotic heart disease of native coronary artery without angina pectoris: Secondary | ICD-10-CM

## 2023-12-05 DIAGNOSIS — S72002A Fracture of unspecified part of neck of left femur, initial encounter for closed fracture: Secondary | ICD-10-CM | POA: Diagnosis not present

## 2023-12-05 DIAGNOSIS — I255 Ischemic cardiomyopathy: Secondary | ICD-10-CM

## 2023-12-05 DIAGNOSIS — I10 Essential (primary) hypertension: Secondary | ICD-10-CM

## 2023-12-05 LAB — TYPE AND SCREEN
ABO/RH(D): AB POS
Antibody Screen: NEGATIVE

## 2023-12-05 LAB — BASIC METABOLIC PANEL WITH GFR
Anion gap: 8 (ref 5–15)
BUN: 28 mg/dL — ABNORMAL HIGH (ref 6–20)
CO2: 22 mmol/L (ref 22–32)
Calcium: 8.3 mg/dL — ABNORMAL LOW (ref 8.9–10.3)
Chloride: 109 mmol/L (ref 98–111)
Creatinine, Ser: 2.37 mg/dL — ABNORMAL HIGH (ref 0.61–1.24)
GFR, Estimated: 31 mL/min — ABNORMAL LOW (ref >60)
Glucose, Bld: 174 mg/dL — ABNORMAL HIGH (ref 70–99)
Potassium: 4.4 mmol/L (ref 3.5–5.1)
Sodium: 139 mmol/L (ref 135–145)

## 2023-12-05 LAB — CBC
HCT: 35.7 % — ABNORMAL LOW (ref 39.0–52.0)
Hemoglobin: 11.4 g/dL — ABNORMAL LOW (ref 13.0–17.0)
MCH: 26.6 pg (ref 26.0–34.0)
MCHC: 31.9 g/dL (ref 30.0–36.0)
MCV: 83.4 fL (ref 80.0–100.0)
Platelets: 152 K/uL (ref 150–400)
RBC: 4.28 MIL/uL (ref 4.22–5.81)
RDW: 13.8 % (ref 11.5–15.5)
WBC: 8.9 K/uL (ref 4.0–10.5)
nRBC: 0 % (ref 0.0–0.2)

## 2023-12-05 LAB — GLUCOSE, CAPILLARY
Glucose-Capillary: 164 mg/dL — ABNORMAL HIGH (ref 70–99)
Glucose-Capillary: 165 mg/dL — ABNORMAL HIGH (ref 70–99)
Glucose-Capillary: 209 mg/dL — ABNORMAL HIGH (ref 70–99)
Glucose-Capillary: 216 mg/dL — ABNORMAL HIGH (ref 70–99)
Glucose-Capillary: 259 mg/dL — ABNORMAL HIGH (ref 70–99)
Glucose-Capillary: 266 mg/dL — ABNORMAL HIGH (ref 70–99)
Glucose-Capillary: 273 mg/dL — ABNORMAL HIGH (ref 70–99)

## 2023-12-05 LAB — TSH: TSH: 3.493 u[IU]/mL (ref 0.350–4.500)

## 2023-12-05 LAB — MAGNESIUM: Magnesium: 2.1 mg/dL (ref 1.7–2.4)

## 2023-12-05 MED ORDER — CHLORHEXIDINE GLUCONATE 4 % EX SOLN: 60.0000 mL | Freq: Once | CUTANEOUS | Status: DC

## 2023-12-05 MED ORDER — VITAMIN D 25 MCG (1000 UNIT) PO TABS
2000.0000 [IU] | ORAL_TABLET | Freq: Every day | ORAL | Status: DC
  Administered 2023-12-05 – 2023-12-08 (×4): 2000 [IU] via ORAL
  Filled 2023-12-05 (×4): qty 2

## 2023-12-05 MED ORDER — CEFAZOLIN SODIUM-DEXTROSE 2-4 GM/100ML-% IV SOLN: 2.0000 g | INTRAVENOUS | Status: DC

## 2023-12-05 MED ORDER — HYDRALAZINE HCL 10 MG PO TABS
10.0000 mg | ORAL_TABLET | Freq: Three times a day (TID) | ORAL | Status: DC
  Administered 2023-12-06 – 2023-12-08 (×7): 10 mg via ORAL
  Filled 2023-12-05 (×9): qty 1

## 2023-12-05 MED ORDER — CHLORHEXIDINE GLUCONATE CLOTH 2 % EX PADS
6.0000 | MEDICATED_PAD | Freq: Every day | CUTANEOUS | Status: DC
  Administered 2023-12-05 – 2023-12-08 (×3): 6 via TOPICAL

## 2023-12-05 MED ORDER — TRANEXAMIC ACID-NACL 1000-0.7 MG/100ML-% IV SOLN
1000.0000 mg | INTRAVENOUS | Status: AC
  Administered 2023-12-06: 1000 mg via INTRAVENOUS

## 2023-12-05 MED ORDER — POVIDONE-IODINE 10 % EX SWAB
2.0000 | Freq: Once | CUTANEOUS | Status: AC
  Administered 2023-12-06: 2 via TOPICAL

## 2023-12-05 MED ORDER — ROSUVASTATIN CALCIUM 20 MG PO TABS
40.0000 mg | ORAL_TABLET | Freq: Every day | ORAL | Status: DC
  Administered 2023-12-05 – 2023-12-08 (×4): 40 mg via ORAL
  Filled 2023-12-05 (×4): qty 2

## 2023-12-05 NOTE — Progress Notes (Signed)
 PROGRESS NOTE    Douglas Carr  FMW:[id number] DOB: 12/29/1965 DOA: 12/03/2023 PCP: [name], [name], MD   Brief Narrative:    58 y.o. male with medical history significant for hypertension, insulin -dependent diabetes mellitus, CAD status post stents and CABG, chronic HFrEF, and CKD 3A who presents with severe left leg pain after a fall.  Patient has been admitted for left hip fracture. Plan for left THA to be done today.  Assessment & Plan:  Principal Problem:   Closed left hip fracture, initial encounter [facility]) Active Problems:   Coronary artery disease involving native coronary artery of native heart with angina pectoris (HCC)   Type 2 diabetes mellitus with obesity (HCC)   Acute renal failure superimposed on stage 3a chronic kidney disease (HCC)   Chronic HFrEF (heart failure with reduced ejection fraction) (HCC)   Prolonged QT interval   Malnutrition of moderate degree    Left hip fracture secondary to mechanical fall: - Continue pain-control, hold antiplatelets, keep NPO   -Plan for left THA today -PT OT eval afterwards    AKI superimposed on CKD 3A : - f/u renal function closely -Avoid nephrotoxic meds - He did receive IVF   CAD s/p CABG  - No recent angina  - Hold ASA and Plavix  in anticipation of surgery, continue beta-blocker, statin, and nitrates    Chronic HFrEF,POA: NYHA II  - Appears compensated  - Monitor weight and I/Os, continue Coreg      Insulin -dependent type 2 DM, uncontrolled - A1c 13.1% - Continue with both long acting and SSI   Prolonged QT interval  - avoid QT-prolonging medications     Urinary retention with BPH - Continue tamsulosin  - Requiring frequent in and out cath  Disposition: Likely Home with HHS  DVT prophylaxis: SCDs Start: 12/04/23 0048     Code Status: Full Code Family Communication:   Status is: Inpatient Remains inpatient appropriate because: Left hip fracture    Subjective:  Going for left THA today. No  acute issues overnight.  Examination:  General exam: Appears calm and comfortable  Respiratory system: Clear to auscultation. Respiratory effort normal. Cardiovascular system: S1 & S2 heard, RRR. No JVD, murmurs, rubs, gallops or clicks. No pedal edema. Gastrointestinal system: Abdomen is nondistended, soft and nontender. No organomegaly or masses felt. Normal bowel sounds heard. Central nervous system: Alert and oriented. No focal neurological deficits. Extremities: Left hip fracture so movements of left leg are painful, power 5/5 elsewhere,  Skin: No rashes, lesions or ulcers Psychiatry: Judgement and insight appear normal. Mood & affect appropriate.     Diet Orders (From admission, onward)     Start     Ordered   12/06/23 0001  Diet NPO time specified Except for: Sips with Meds  Diet effective midnight       Question:  Except for  Answer:  Sips with Meds   12/05/23 1020   12/05/23 1020  Diet Carb Modified Fluid consistency: Thin; Room service appropriate? Yes  Diet effective now       Question Answer Comment  Diet-HS Snack? Nothing   Calorie Level Medium 1600-2000   Fluid consistency: Thin   Room service appropriate? Yes      12/05/23 1019            Objective: Vitals:   12/04/23 1528 12/04/23 2017 12/05/23 0514 12/05/23 0801  BP: (!) 106/42 (!) 129/42 93/65 (!) 141/58  Pulse: 70 85 75 77  Resp: 18 18 17 18   Temp: 97.7 F (  36.5 C) 98.2 F (36.8 C) 98.2 F (36.8 C) 98.2 F (36.8 C)  TempSrc:  Oral Oral Oral  SpO2: 97% 100% 95% 93%  Weight:      Height:        Intake/Output Summary (Last 24 hours) at 12/05/2023 1126 Last data filed at 12/04/2023 1800 Gross per 24 hour  Intake 51.92 ml  Output 550 ml  Net -498.08 ml   Filed Weights   12/03/23 1514  Weight: 91.6 kg    Scheduled Meds:  amLODipine   10 mg Oral Daily   atorvastatin   80 mg Oral QPM   carvedilol   3.125 mg Oral Q12H   chlorhexidine   60 mL Topical Once   Chlorhexidine  Gluconate Cloth  6 each  Topical Daily   cholecalciferol   2,000 Units Oral Daily   insulin  aspart  0-6 Units Subcutaneous Q4H   insulin  glargine-yfgn  5 Units Subcutaneous QHS   isosorbide  dinitrate  5 mg Oral BID   mirtazapine   7.5 mg Oral QHS   multivitamin with minerals  1 tablet Oral Daily   povidone-iodine   2 Application Topical Once   tamsulosin   0.4 mg Oral Daily   Continuous Infusions:  sodium chloride  75 mL/hr at 12/04/23 1318    ceFAZolin  (ANCEF ) IV     tranexamic acid       Nutritional status Signs/Symptoms: mild fat depletion, mild muscle depletion Interventions: MVI, Glucerna shake, Ensure Enlive (each supplement provides 350kcal and 20 grams of protein) Body mass index is 28.98 kg/m.  Data Reviewed:   CBC: Recent Labs  Lab 12/03/23 1302 12/03/23 1615 12/04/23 0248 12/05/23 0807  WBC 8.2 17.5* 10.6* 8.9  NEUTROABS 5.0 14.1*  --   --   HGB 15.3 14.7 13.2 11.4*  HCT 48.2 44.4 39.7 35.7*  MCV 85 81.6 81.4 83.4  PLT 220 239 199 152   Basic Metabolic Panel: Recent Labs  Lab 12/03/23 1302 12/03/23 1615 12/04/23 0248 12/05/23 0807  NA 136 135 136 139  K 5.5* 4.5 4.7 4.4  CL 99 100 104 109  CO2 22 20* 23 22  GLUCOSE 344* 394* 318* 174*  BUN 28* 30* 31* 28*  CREATININE 2.00* 2.09* 2.02* 2.37*  CALCIUM  9.1 8.9 8.2* 8.3*  MG  --   --  2.2 2.1   GFR: Estimated Creatinine Clearance: 39.1 mL/min (A) (by C-G formula based on SCr of 2.37 mg/dL (H)). Liver Function Tests: Recent Labs  Lab 12/03/23 1302 12/03/23 1615  AST 10 21  ALT 9 13  ALKPHOS 125* 106  BILITOT <0.2 0.7  PROT 6.4 7.2  ALBUMIN  3.5* 3.2*   No results for input(s): LIPASE, AMYLASE in the last 168 hours. No results for input(s): AMMONIA in the last 168 hours. Coagulation Profile: Recent Labs  Lab 12/03/23 1615  INR 0.9   Cardiac Enzymes: No results for input(s): CKTOTAL, CKMB, CKMBINDEX, TROPONINI in the last 168 hours. BNP (last 3 results) No results for input(s): PROBNP in the last  8760 hours. HbA1C: Recent Labs    12/03/23 1301  HGBA1C 13.1*   CBG: Recent Labs  Lab 12/04/23 2019 12/04/23 2056 12/05/23 0024 12/05/23 0426 12/05/23 0803  GLUCAP 274* 270* 216* 209* 165*   Lipid Profile: Recent Labs    12/03/23 1302  CHOL 430*  HDL 31*  LDLCALC 259*  TRIG 572*  CHOLHDL 13.9*   Thyroid Function Tests: No results for input(s): TSH, T4TOTAL, FREET4, T3FREE, THYROIDAB in the last 72 hours. Anemia Panel: No results for input(s): VITAMINB12, FOLATE, FERRITIN,  TIBC, IRON, RETICCTPCT in the last 72 hours. Sepsis Labs: No results for input(s): PROCALCITON, LATICACIDVEN in the last 168 hours.  Recent Results (from the past 240 hours)  Surgical PCR screen     Status: None   Collection Time: 12/04/23 12:02 PM   Specimen: Nasal Mucosa; Nasal Swab  Result Value Ref Range Status   MRSA, PCR NEGATIVE NEGATIVE Final   Staphylococcus aureus NEGATIVE NEGATIVE Final    Comment: (NOTE) The Xpert SA Assay (FDA approved for NASAL specimens in patients 76 years of age and older), is one component of a comprehensive surveillance program. It is not intended to diagnose infection nor to guide or monitor treatment. Performed at [facility], [address] [place], [place] [address]          Radiology Studies: DG Knee Left Port Result Date: 12/03/2023 CLINICAL DATA:  Fall EXAM: PORTABLE LEFT KNEE - 1-2 VIEW COMPARISON:  None Available. FINDINGS: No evidence of fracture, dislocation, or joint effusion. There are mild degenerative osteophytes of the knee with mild medial compartment joint space narrowing. Peripheral vascular calcifications are present. Tissues otherwise within normal limits. IMPRESSION: 1. No acute fracture or dislocation. 2. Mild degenerative changes. Electronically Signed   By: Greig Pique M.D.   On: 12/03/2023 21:13   DG Chest Portable 1 View Result Date: 12/03/2023 CLINICAL DATA:  Weakness EXAM: PORTABLE CHEST 1  VIEW COMPARISON:  05/26/2021 FINDINGS: Multiple benign calcified granuloma are seen within the right lung. Lungs are otherwise clear. No pneumothorax or pleural effusion. Coronary artery bypass grafting has been performed. Cardiac size within limits. Left subclavian single lead pacemaker defibrillator is unchanged. Pulmonary vascularity is normal. No acute bone abnormality IMPRESSION: 1. No active disease. Electronically Signed   By: Dorethia Molt M.D.   On: 12/03/2023 19:58   DG HIP UNILAT WITH PELVIS 2-3 VIEWS LEFT Result Date: 12/03/2023 CLINICAL DATA:  Pain after a fall EXAM: DG HIP (WITH OR WITHOUT PELVIS) 2-3V LEFT COMPARISON:  CT abdomen and pelvis 06/11/2016 FINDINGS: Transverse fracture through the left femoral neck with varus angulation of the proximal femur. No dislocation at the hip joint. No inter trochanteric involvement is identified. Degenerative changes in the left hip as well as in the right hip and lower lumbar spine. Visualized pelvis appears intact. IMPRESSION: Transverse fracture of the left femoral neck with varus angulation. Electronically Signed   By: Elsie Gravely M.D.   On: 12/03/2023 19:17   DG Elbow Complete Left Result Date: 12/03/2023 CLINICAL DATA:  Pain after a fall.  Skin lacerations. EXAM: LEFT ELBOW - COMPLETE 3+ VIEW COMPARISON:  None Available. FINDINGS: Surgical clip in the antecubital fossa. Soft tissues are otherwise unremarkable. No radiopaque foreign bodies or soft tissue gas. Bones appear intact. No evidence of acute fracture or dislocation. No focal bone lesion or bone destruction. No significant effusion. IMPRESSION: No acute bony abnormalities. Electronically Signed   By: Elsie Gravely M.D.   On: 12/03/2023 19:16       LOS: 2 days   Time spent= 41 mins    [name], MD [facility]  If 7PM-7AM, please contact night-coverage  12/05/2023, 11:26 AM
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
## 2023-12-06 ENCOUNTER — Encounter (HOSPITAL_COMMUNITY): Payer: Self-pay | Admitting: Family Medicine

## 2023-12-06 ENCOUNTER — Inpatient Hospital Stay (HOSPITAL_COMMUNITY)

## 2023-12-06 ENCOUNTER — Inpatient Hospital Stay (HOSPITAL_COMMUNITY): Payer: Self-pay | Admitting: Anesthesiology

## 2023-12-06 ENCOUNTER — Encounter (HOSPITAL_COMMUNITY)
Admission: EM | Disposition: A | Discharge status: Skilled Nursing Facility | Payer: Self-pay | Source: Home / Self Care | Attending: Internal Medicine

## 2023-12-06 ENCOUNTER — Other Ambulatory Visit: Payer: Self-pay

## 2023-12-06 DIAGNOSIS — N1831 Chronic kidney disease, stage 3a: Secondary | ICD-10-CM

## 2023-12-06 DIAGNOSIS — I5042 Chronic combined systolic (congestive) and diastolic (congestive) heart failure: Secondary | ICD-10-CM | POA: Diagnosis not present

## 2023-12-06 DIAGNOSIS — I130 Hypertensive heart and chronic kidney disease with heart failure and stage 1 through stage 4 chronic kidney disease, or unspecified chronic kidney disease: Secondary | ICD-10-CM | POA: Diagnosis not present

## 2023-12-06 DIAGNOSIS — S72002A Fracture of unspecified part of neck of left femur, initial encounter for closed fracture: Secondary | ICD-10-CM | POA: Diagnosis not present

## 2023-12-06 DIAGNOSIS — Z96642 Presence of left artificial hip joint: Secondary | ICD-10-CM

## 2023-12-06 LAB — BASIC METABOLIC PANEL WITH GFR
Anion gap: 9 (ref 5–15)
BUN: 27 mg/dL — ABNORMAL HIGH (ref 6–20)
CO2: 22 mmol/L (ref 22–32)
Calcium: 8.1 mg/dL — ABNORMAL LOW (ref 8.9–10.3)
Chloride: 105 mmol/L (ref 98–111)
Creatinine, Ser: 2.07 mg/dL — ABNORMAL HIGH (ref 0.61–1.24)
GFR, Estimated: 37 mL/min — ABNORMAL LOW (ref >60)
Glucose, Bld: 297 mg/dL — ABNORMAL HIGH (ref 70–99)
Potassium: 4.3 mmol/L (ref 3.5–5.1)
Sodium: 136 mmol/L (ref 135–145)

## 2023-12-06 LAB — GLUCOSE, CAPILLARY
Glucose-Capillary: 273 mg/dL — ABNORMAL HIGH (ref 70–99)
Glucose-Capillary: 258 mg/dL — ABNORMAL HIGH (ref 70–99)
Glucose-Capillary: 243 mg/dL — ABNORMAL HIGH (ref 70–99)
Glucose-Capillary: 219 mg/dL — ABNORMAL HIGH (ref 70–99)
Glucose-Capillary: 231 mg/dL — ABNORMAL HIGH (ref 70–99)
Glucose-Capillary: 225 mg/dL — ABNORMAL HIGH (ref 70–99)

## 2023-12-06 LAB — CBC
HCT: 35.5 % — ABNORMAL LOW (ref 39.0–52.0)
Hemoglobin: 11.6 g/dL — ABNORMAL LOW (ref 13.0–17.0)
MCH: 27 pg (ref 26.0–34.0)
MCHC: 32.7 g/dL (ref 30.0–36.0)
MCV: 82.6 fL (ref 80.0–100.0)
Platelets: 155 K/uL (ref 150–400)
RBC: 4.3 MIL/uL (ref 4.22–5.81)
RDW: 13.6 % (ref 11.5–15.5)
WBC: 11.8 K/uL — ABNORMAL HIGH (ref 4.0–10.5)
nRBC: 0 % (ref 0.0–0.2)

## 2023-12-06 SURGERY — ARTHROPLASTY, HIP, TOTAL, ANTERIOR APPROACH
Anesthesia: General | Site: Hip | Laterality: Left

## 2023-12-06 MED ORDER — MIDAZOLAM HCL 2 MG/2ML IJ SOLN
INTRAMUSCULAR | Status: DC | PRN
  Administered 2023-12-06: 2 mg via INTRAVENOUS

## 2023-12-06 MED ORDER — ACETAMINOPHEN 10 MG/ML IV SOLN
1000.0000 mg | Freq: Once | INTRAVENOUS | Status: DC | PRN
  Administered 2023-12-06: 1000 mg via INTRAVENOUS

## 2023-12-06 MED ORDER — OXYCODONE HCL 5 MG PO TABS
5.0000 mg | ORAL_TABLET | ORAL | Status: DC | PRN
  Administered 2023-12-06 – 2023-12-07 (×4): 10 mg via ORAL
  Filled 2023-12-06 (×4): qty 2

## 2023-12-06 MED ORDER — INSULIN ASPART 100 UNIT/ML IJ SOLN
0.0000 [IU] | INTRAMUSCULAR | Status: DC | PRN
  Administered 2023-12-06: 6 [IU] via SUBCUTANEOUS

## 2023-12-06 MED ORDER — CEFAZOLIN SODIUM-DEXTROSE 2-3 GM-%(50ML) IV SOLR
INTRAVENOUS | Status: DC | PRN
  Administered 2023-12-06: 2 g via INTRAVENOUS

## 2023-12-06 MED ORDER — CHLORHEXIDINE GLUCONATE 0.12 % MT SOLN
OROMUCOSAL | Status: AC
  Filled 2023-12-06: qty 15

## 2023-12-06 MED ORDER — CHLORHEXIDINE GLUCONATE 0.12 % MT SOLN
15.0000 mL | Freq: Once | OROMUCOSAL | Status: AC
  Administered 2023-12-06: 15 mL via OROMUCOSAL

## 2023-12-06 MED ORDER — PHENOL 1.4 % MT LIQD: 1.0000 | OROMUCOSAL | Status: DC | PRN

## 2023-12-06 MED ORDER — MIDAZOLAM HCL 2 MG/2ML IJ SOLN
INTRAMUSCULAR | Status: AC
  Filled 2023-12-06: qty 2

## 2023-12-06 MED ORDER — LACTATED RINGERS IV SOLN: INTRAVENOUS | Status: DC | PRN

## 2023-12-06 MED ORDER — DEXAMETHASONE SODIUM PHOSPHATE 10 MG/ML IJ SOLN
10.0000 mg | Freq: Once | INTRAMUSCULAR | Status: AC
  Administered 2023-12-07: 10 mg via INTRAVENOUS
  Filled 2023-12-06: qty 1

## 2023-12-06 MED ORDER — SENNA 8.6 MG PO TABS
2.0000 | ORAL_TABLET | Freq: Every day | ORAL | Status: DC
  Administered 2023-12-06 – 2023-12-07 (×2): 17.2 mg via ORAL
  Filled 2023-12-06 (×2): qty 2

## 2023-12-06 MED ORDER — MENTHOL 3 MG MT LOZG: 1.0000 | LOZENGE | OROMUCOSAL | Status: DC | PRN

## 2023-12-06 MED ORDER — FENTANYL CITRATE (PF) 100 MCG/2ML IJ SOLN
25.0000 ug | INTRAMUSCULAR | Status: DC | PRN
  Administered 2023-12-06: 50 ug via INTRAVENOUS
  Administered 2023-12-06 (×2): 25 ug via INTRAVENOUS

## 2023-12-06 MED ORDER — OXYCODONE HCL 5 MG PO TABS
2.5000 mg | ORAL_TABLET | ORAL | Status: DC | PRN
  Administered 2023-12-06 – 2023-12-07 (×2): 5 mg via ORAL
  Filled 2023-12-06 (×2): qty 1

## 2023-12-06 MED ORDER — EPHEDRINE SULFATE-NACL 50-0.9 MG/10ML-% IV SOSY
PREFILLED_SYRINGE | INTRAVENOUS | Status: DC | PRN
  Administered 2023-12-06 (×3): 5 mg via INTRAVENOUS

## 2023-12-06 MED ORDER — OXYCODONE HCL 5 MG/5ML PO SOLN: 5.0000 mg | Freq: Once | ORAL | Status: DC | PRN

## 2023-12-06 MED ORDER — PHENYLEPHRINE 80 MCG/ML (10ML) SYRINGE FOR IV PUSH (FOR BLOOD PRESSURE SUPPORT)
PREFILLED_SYRINGE | INTRAVENOUS | Status: DC | PRN
  Administered 2023-12-06 (×2): 120 ug via INTRAVENOUS
  Administered 2023-12-06: 80 ug via INTRAVENOUS

## 2023-12-06 MED ORDER — FENTANYL CITRATE (PF) 100 MCG/2ML IJ SOLN
INTRAMUSCULAR | Status: AC
  Filled 2023-12-06: qty 2

## 2023-12-06 MED ORDER — BISACODYL 10 MG RE SUPP: 10.0000 mg | Freq: Every day | RECTAL | Status: DC | PRN

## 2023-12-06 MED ORDER — OXYCODONE HCL 5 MG PO TABS: 5.0000 mg | ORAL_TABLET | Freq: Once | ORAL | Status: DC | PRN

## 2023-12-06 MED ORDER — METHOCARBAMOL 500 MG PO TABS
500.0000 mg | ORAL_TABLET | Freq: Four times a day (QID) | ORAL | Status: DC | PRN
  Administered 2023-12-06 – 2023-12-08 (×4): 500 mg via ORAL
  Filled 2023-12-06 (×4): qty 1

## 2023-12-06 MED ORDER — PHENYLEPHRINE HCL-NACL 20-0.9 MG/250ML-% IV SOLN
INTRAVENOUS | Status: DC | PRN
  Administered 2023-12-06: 50 ug/min via INTRAVENOUS

## 2023-12-06 MED ORDER — ACETAMINOPHEN 500 MG PO TABS
1000.0000 mg | ORAL_TABLET | Freq: Four times a day (QID) | ORAL | Status: DC
  Administered 2023-12-06 – 2023-12-08 (×7): 1000 mg via ORAL
  Filled 2023-12-06 (×9): qty 2

## 2023-12-06 MED ORDER — 0.9 % SODIUM CHLORIDE (POUR BTL) OPTIME
TOPICAL | Status: DC | PRN
  Administered 2023-12-06: 1000 mL

## 2023-12-06 MED ORDER — DIPHENHYDRAMINE HCL 12.5 MG/5ML PO ELIX: 12.5000 mg | ORAL_SOLUTION | ORAL | Status: DC | PRN

## 2023-12-06 MED ORDER — CEFAZOLIN SODIUM-DEXTROSE 2-4 GM/100ML-% IV SOLN
2.0000 g | Freq: Four times a day (QID) | INTRAVENOUS | Status: AC
  Administered 2023-12-06 (×2): 2 g via INTRAVENOUS
  Filled 2023-12-06 (×2): qty 100

## 2023-12-06 MED ORDER — METOCLOPRAMIDE HCL 5 MG/ML IJ SOLN: 5.0000 mg | Freq: Three times a day (TID) | INTRAMUSCULAR | Status: DC | PRN

## 2023-12-06 MED ORDER — FENTANYL CITRATE (PF) 250 MCG/5ML IJ SOLN
INTRAMUSCULAR | Status: AC
  Filled 2023-12-06: qty 5

## 2023-12-06 MED ORDER — ROCURONIUM BROMIDE 10 MG/ML (PF) SYRINGE
PREFILLED_SYRINGE | INTRAVENOUS | Status: DC | PRN
  Administered 2023-12-06: 20 mg via INTRAVENOUS
  Administered 2023-12-06: 40 mg via INTRAVENOUS

## 2023-12-06 MED ORDER — SUGAMMADEX SODIUM 200 MG/2ML IV SOLN
INTRAVENOUS | Status: DC | PRN
  Administered 2023-12-06: 200 mg via INTRAVENOUS

## 2023-12-06 MED ORDER — FENTANYL CITRATE (PF) 250 MCG/5ML IJ SOLN
INTRAMUSCULAR | Status: DC | PRN
  Administered 2023-12-06 (×2): 50 ug via INTRAVENOUS

## 2023-12-06 MED ORDER — TRANEXAMIC ACID-NACL 1000-0.7 MG/100ML-% IV SOLN
1000.0000 mg | Freq: Once | INTRAVENOUS | Status: AC
  Administered 2023-12-06: 1000 mg via INTRAVENOUS
  Filled 2023-12-06: qty 100

## 2023-12-06 MED ORDER — SODIUM CHLORIDE 0.9 % IV SOLN: INTRAVENOUS | Status: DC

## 2023-12-06 MED ORDER — ASPIRIN 81 MG PO CHEW
81.0000 mg | CHEWABLE_TABLET | Freq: Two times a day (BID) | ORAL | Status: DC
  Administered 2023-12-06: 81 mg via ORAL
  Filled 2023-12-06: qty 1

## 2023-12-06 MED ORDER — CEFAZOLIN SODIUM-DEXTROSE 2-4 GM/100ML-% IV SOLN
INTRAVENOUS | Status: AC
  Filled 2023-12-06: qty 100

## 2023-12-06 MED ORDER — HYDROMORPHONE HCL 1 MG/ML IJ SOLN
0.5000 mg | INTRAMUSCULAR | Status: DC | PRN
  Administered 2023-12-06 – 2023-12-08 (×4): 1 mg via INTRAVENOUS
  Filled 2023-12-06 (×4): qty 1

## 2023-12-06 MED ORDER — POLYETHYLENE GLYCOL 3350 17 G PO PACK
17.0000 g | PACK | Freq: Two times a day (BID) | ORAL | Status: DC
  Administered 2023-12-06 – 2023-12-08 (×5): 17 g via ORAL
  Filled 2023-12-06 (×5): qty 1

## 2023-12-06 MED ORDER — ALUM & MAG HYDROXIDE-SIMETH 200-200-20 MG/5ML PO SUSP: 30.0000 mL | ORAL | Status: DC | PRN

## 2023-12-06 MED ORDER — ORAL CARE MOUTH RINSE: 15.0000 mL | Freq: Once | OROMUCOSAL | Status: AC

## 2023-12-06 MED ORDER — ONDANSETRON HCL 4 MG/2ML IJ SOLN
INTRAMUSCULAR | Status: DC | PRN
  Administered 2023-12-06: 4 mg via INTRAVENOUS

## 2023-12-06 MED ORDER — ACETAMINOPHEN 10 MG/ML IV SOLN
INTRAVENOUS | Status: AC
  Filled 2023-12-06: qty 100

## 2023-12-06 MED ORDER — LIDOCAINE HCL (CARDIAC) PF 100 MG/5ML IV SOSY
PREFILLED_SYRINGE | INTRAVENOUS | Status: DC | PRN
  Administered 2023-12-06: 40 mg via INTRAVENOUS

## 2023-12-06 MED ORDER — METHOCARBAMOL 1000 MG/10ML IJ SOLN
500.0000 mg | Freq: Four times a day (QID) | INTRAMUSCULAR | Status: DC | PRN
Start: 2023-12-06 — End: 2023-12-08

## 2023-12-06 MED ORDER — PROPOFOL 10 MG/ML IV BOLUS
INTRAVENOUS | Status: AC
  Filled 2023-12-06: qty 20

## 2023-12-06 MED ORDER — PROPOFOL 10 MG/ML IV BOLUS
INTRAVENOUS | Status: DC | PRN
  Administered 2023-12-06: 60 mg via INTRAVENOUS

## 2023-12-06 MED ORDER — METOCLOPRAMIDE HCL 5 MG PO TABS: 5.0000 mg | ORAL_TABLET | Freq: Three times a day (TID) | ORAL | Status: DC | PRN

## 2023-12-06 MED ORDER — TRANEXAMIC ACID-NACL 1000-0.7 MG/100ML-% IV SOLN
INTRAVENOUS | Status: AC
  Filled 2023-12-06: qty 100

## 2023-12-06 SURGICAL SUPPLY — 42 items
BAG COUNTER SPONGE SURGICOUNT (BAG) ×1 IMPLANT
BLADE CLIPPER SURG (BLADE) IMPLANT
BLADE SAW SGTL 18X1.27X75 (BLADE) ×1 IMPLANT
CHLORAPREP W/TINT 26 (MISCELLANEOUS) IMPLANT
CLEANER CAUTERY TIP PAD (MISCELLANEOUS) IMPLANT
COVER SURGICAL LIGHT HANDLE (MISCELLANEOUS) ×1 IMPLANT
CUP ACET PINNACLE SECTR 56MM (Hips) IMPLANT
DERMABOND ADVANCED .7 DNX12 (GAUZE/BANDAGES/DRESSINGS) ×1 IMPLANT
DRAPE C-ARM 42X72 X-RAY (DRAPES) ×1 IMPLANT
DRAPE IMP U-DRAPE 54X76 (DRAPES) ×1 IMPLANT
DRAPE STERI IOBAN 125X83 (DRAPES) ×1 IMPLANT
DRAPE U-SHAPE 47X51 STRL (DRAPES) ×3 IMPLANT
DRSG AQUACEL AG ADV 3.5X10 (GAUZE/BANDAGES/DRESSINGS) ×1 IMPLANT
DRSG TEGADERM 4X4.75 (GAUZE/BANDAGES/DRESSINGS) ×1 IMPLANT
ELECT BLADE TIP CTD 4 INCH (ELECTRODE) IMPLANT
ELECTRODE REM PT RTRN 9FT ADLT (ELECTROSURGICAL) ×1 IMPLANT
FACESHIELD WRAPAROUND OR TEAM (MASK) ×1 IMPLANT
GAUZE SPONGE 2X2 8PLY STRL LF (GAUZE/BANDAGES/DRESSINGS) ×1 IMPLANT
GLOVE BIOGEL PI IND STRL 7.5 (GLOVE) ×3 IMPLANT
GLOVE ORTHO TXT STRL SZ7.5 (GLOVE) ×2 IMPLANT
GLOVE SURG ENC MOIS LTX SZ6 (GLOVE) ×1 IMPLANT
GLOVE SURG UNDER POLY LF SZ6.5 (GLOVE) ×2 IMPLANT
GOWN STRL REUS W/ TWL LRG LVL3 (GOWN DISPOSABLE) ×2 IMPLANT
GOWN STRL REUS W/ TWL XL LVL3 (GOWN DISPOSABLE) IMPLANT
HEAD CERAMIC 36 PLUS5 (Hips) IMPLANT
KIT BASIN OR (CUSTOM PROCEDURE TRAY) ×1 IMPLANT
KIT TURNOVER KIT B (KITS) ×1 IMPLANT
MANIFOLD NEPTUNE II (INSTRUMENTS) ×1 IMPLANT
NS IRRIG 1000ML POUR BTL (IV SOLUTION) ×1 IMPLANT
PACK TOTAL JOINT (CUSTOM PROCEDURE TRAY) ×1 IMPLANT
PAD ARMBOARD POSITIONER FOAM (MISCELLANEOUS) ×2 IMPLANT
PINNACLE ALTRX PLUS 4 N 36X56 (Hips) IMPLANT
SCREW 6.5MMX25MM (Screw) IMPLANT
STAPLER SKIN PROX 35W (STAPLE) ×1 IMPLANT
STEM FEMORAL SZ6 HIGH ACTIS (Stem) IMPLANT
SUCTION TUBE FRAZIER 10FR DISP (SUCTIONS) ×1 IMPLANT
SUT ETHIBOND NAB CT1 #1 30IN (SUTURE) IMPLANT
SUT VIC AB 1 CT1 27XBRD ANBCTR (SUTURE) ×4 IMPLANT
SUT VIC AB 2-0 CT1 TAPERPNT 27 (SUTURE) ×2 IMPLANT
SUTURE STRATFX 0 PDS 27 VIOLET (SUTURE) ×1 IMPLANT
TOWEL GREEN STERILE (TOWEL DISPOSABLE) ×1 IMPLANT
WATER STERILE IRR 1000ML POUR (IV SOLUTION) ×3 IMPLANT

## 2023-12-06 NOTE — H&P (View-Only) (Signed)
 Patient ID: Douglas Carr, male   DOB: 26-Apr-1966, 58 y.o.   MRN: [id number] Displaced left hip femoral neck fracture after ground level fall on 8/1 Unable to get to OR yesterday so I was asked to take over care  Reviewed indications for total hip replacement NPO Consent changed to reflect surgeon change  Cardiology has seen him and notes peri-operative risks but will need to proceed in order to address pain and restore functional quality of life

## 2023-12-06 NOTE — Progress Notes (Signed)
 PROGRESS NOTE    Douglas Carr  FMW:[id number] DOB: 1965/08/24 DOA: 12/03/2023 PCP: [name], [name], MD   Brief Narrative:    58 y.o. male with medical history significant for hypertension, insulin -dependent diabetes mellitus, CAD status post stents and CABG, chronic HFrEF, and CKD 3A who presents with severe left leg pain after a fall.  Patient has been admitted for left hip fracture. Plan for left THA to be done today.  Assessment & Plan:  Principal Problem:   Closed left hip fracture, initial encounter [facility]) Active Problems:   Coronary artery disease involving native coronary artery of native heart with angina pectoris (HCC)   Type 2 diabetes mellitus with obesity (HCC)   Acute renal failure superimposed on stage 3a chronic kidney disease (HCC)   Chronic HFrEF (heart failure with reduced ejection fraction) (HCC)   Prolonged QT interval   Malnutrition of moderate degree   S/P total left hip arthroplasty    Left hip fracture secondary to mechanical fall: - Continue pain-control, hold antiplatelets, kept NPO after midnight. -Plan for left THA today -PT OT eval afterwards    AKI superimposed on CKD 3A : Improving - f/u renal function closely -Avoid nephrotoxic meds - He did receive IVF -Creatinine is 2.07 down from 2.37 yesterday.   CAD s/p CABG  - No recent angina  - Hold ASA and Plavix  in anticipation of surgery, continue beta-blocker, statin, and nitrates    Chronic HFrEF,POA: NYHA II  - Appears compensated  - Monitor weight and I/Os, continue Coreg      Insulin -dependent type 2 DM, uncontrolled - A1c 13.1% - Continue with both long acting and SSI   Prolonged QT interval  - avoid QT-prolonging medications     Urinary retention with BPH - Continue tamsulosin  - Requiring frequent in and out cath  Disposition: Likely Home with HHS. Needs PT OT evaluation.  DVT prophylaxis: SCDs Start: 12/04/23 0048     Code Status: Full Code Family Communication:    Status is: Inpatient Remains inpatient appropriate because: Left hip fracture  Subjective:  Going for left THA today. No acute issues overnight. He has been kept NPO after midnight.  Examination:  General exam: Appears calm and comfortable  Respiratory system: Clear to auscultation. Respiratory effort normal. Cardiovascular system: S1 & S2 heard, RRR. No JVD, murmurs, rubs, gallops or clicks. No pedal edema. Gastrointestinal system: Abdomen is nondistended, soft and nontender. No organomegaly or masses felt. Normal bowel sounds heard. Central nervous system: Alert and oriented. No focal neurological deficits. Extremities: Left hip fracture so movements of left leg are painful, power 5/5 elsewhere,  Skin: No rashes, lesions or ulcers Psychiatry: Judgement and insight appear normal. Mood & affect appropriate.     Diet Orders (From admission, onward)     Start     Ordered   12/06/23 0001  Diet NPO time specified Except for: Sips with Meds  Diet effective midnight       Question:  Except for  Answer:  Sips with Meds   12/05/23 1020            Objective: Vitals:   12/06/23 0712 12/06/23 0717 12/06/23 0930 12/06/23 0945  BP:  (!) 165/68  (!) 207/84  Pulse:    85  Resp:  18  10  Temp:  98.5 F (36.9 C) 99.2 F (37.3 C)   TempSrc:  Oral    SpO2:  94%  96%  Weight: 91.6 kg     Height: 5' 10 (1.778 m)  Intake/Output Summary (Last 24 hours) at 12/06/2023 1005 Last data filed at 12/06/2023 9077 Gross per 24 hour  Intake 1000 ml  Output 2575 ml  Net -1575 ml   Filed Weights   12/03/23 1514 12/06/23 0712  Weight: 91.6 kg 91.6 kg    Scheduled Meds:  [MAR Hold] carvedilol   3.125 mg Oral Q12H   chlorhexidine   60 mL Topical Once   [MAR Hold] Chlorhexidine  Gluconate Cloth  6 each Topical Daily   [MAR Hold] cholecalciferol   2,000 Units Oral Daily   [MAR Hold] hydrALAZINE   10 mg Oral Q8H   [MAR Hold] insulin  aspart  0-6 Units Subcutaneous Q4H   [MAR Hold] insulin   glargine-yfgn  5 Units Subcutaneous QHS   [MAR Hold] isosorbide  dinitrate  5 mg Oral BID   [MAR Hold] mirtazapine   7.5 mg Oral QHS   [MAR Hold] multivitamin with minerals  1 tablet Oral Daily   [MAR Hold] rosuvastatin   40 mg Oral Daily   [MAR Hold] tamsulosin   0.4 mg Oral Daily   Continuous Infusions:  sodium chloride  10 mL/hr at 12/06/23 9271   acetaminophen       Nutritional status Signs/Symptoms: mild fat depletion, mild muscle depletion Interventions: MVI, Glucerna shake, Ensure Enlive (each supplement provides 350kcal and 20 grams of protein) Body mass index is 28.98 kg/m.  Data Reviewed:   CBC: Recent Labs  Lab 12/03/23 1302 12/03/23 1615 12/04/23 0248 12/05/23 0807 12/06/23 0540  WBC 8.2 17.5* 10.6* 8.9 11.8*  NEUTROABS 5.0 14.1*  --   --   --   HGB 15.3 14.7 13.2 11.4* 11.6*  HCT 48.2 44.4 39.7 35.7* 35.5*  MCV 85 81.6 81.4 83.4 82.6  PLT 220 239 199 152 155   Basic Metabolic Panel: Recent Labs  Lab 12/03/23 1302 12/03/23 1615 12/04/23 0248 12/05/23 0807 12/06/23 0540  NA 136 135 136 139 136  K 5.5* 4.5 4.7 4.4 4.3  CL 99 100 104 109 105  CO2 22 20* 23 22 22   GLUCOSE 344* 394* 318* 174* 297*  BUN 28* 30* 31* 28* 27*  CREATININE 2.00* 2.09* 2.02* 2.37* 2.07*  CALCIUM  9.1 8.9 8.2* 8.3* 8.1*  MG  --   --  2.2 2.1  --    GFR: Estimated Creatinine Clearance: 44.8 mL/min (A) (by C-G formula based on SCr of 2.07 mg/dL (H)). Liver Function Tests: Recent Labs  Lab 12/03/23 1302 12/03/23 1615  AST 10 21  ALT 9 13  ALKPHOS 125* 106  BILITOT <0.2 0.7  PROT 6.4 7.2  ALBUMIN  3.5* 3.2*   No results for input(s): LIPASE, AMYLASE in the last 168 hours. No results for input(s): AMMONIA in the last 168 hours. Coagulation Profile: Recent Labs  Lab 12/03/23 1615  INR 0.9   Cardiac Enzymes: No results for input(s): CKTOTAL, CKMB, CKMBINDEX, TROPONINI in the last 168 hours. BNP (last 3 results) No results for input(s): PROBNP in the last  8760 hours. HbA1C: Recent Labs    12/03/23 1301  HGBA1C 13.1*   CBG: Recent Labs  Lab 12/05/23 1951 12/05/23 2337 12/06/23 0445 12/06/23 0723 12/06/23 0933  GLUCAP 259* 273* 273* 258* 243*   Lipid Profile: Recent Labs    12/03/23 1302  CHOL 430*  HDL 31*  LDLCALC 259*  TRIG 572*  CHOLHDL 13.9*   Thyroid Function Tests: Recent Labs    12/05/23 1616  TSH 3.493   Anemia Panel: No results for input(s): VITAMINB12, FOLATE, FERRITIN, TIBC, IRON, RETICCTPCT in the last 72 hours. Sepsis Labs: No  results for input(s): PROCALCITON, LATICACIDVEN in the last 168 hours.  Recent Results (from the past 240 hours)  Surgical PCR screen     Status: None   Collection Time: 12/04/23 12:02 PM   Specimen: Nasal Mucosa; Nasal Swab  Result Value Ref Range Status   MRSA, PCR NEGATIVE NEGATIVE Final   Staphylococcus aureus NEGATIVE NEGATIVE Final    Comment: (NOTE) The Xpert SA Assay (FDA approved for NASAL specimens in patients 100 years of age and older), is one component of a comprehensive surveillance program. It is not intended to diagnose infection nor to guide or monitor treatment. Performed at [facility], [address], [place], [place] [address]          Radiology Studies: DG C-Arm 1-60 Min-No Report Result Date: 12/06/2023 Fluoroscopy was utilized by the requesting physician.  No radiographic interpretation.   DG C-Arm 1-60 Min-No Report Result Date: 12/06/2023 Fluoroscopy was utilized by the requesting physician.  No radiographic interpretation.      LOS: 3 days   Time spent= 40 mins    [name], MD [facility]  If 7PM-7AM, please contact night-coverage  12/06/2023, 10:05 AM

## 2023-12-06 NOTE — Interval H&P Note (Signed)
 History and Physical Interval Note:  12/06/2023 7:49 AM  Douglas Carr  has presented today for surgery, with the diagnosis of LEFT HIP FRACTURE.  The various methods of treatment have been discussed with the patient and family. After consideration of risks, benefits and other options for treatment, the patient has consented to  Procedure(s): ARTHROPLASTY, HIP, TOTAL, ANTERIOR APPROACH (Left) as a surgical intervention.  The patient's history has been reviewed, patient examined, no change in status, stable for surgery.  I have reviewed the patient's chart and labs.  Questions were answered to the patient's satisfaction.     [name]

## 2023-12-06 NOTE — Op Note (Signed)
 NAME:  Douglas Carr                ACCOUNT NO.: [id number]      MEDICAL RECORD NO.: [id number]      FACILITY:  [facility]      PHYSICIAN:  [name]  DATE OF BIRTH:  21-Aug-1965     DATE OF PROCEDURE:  12/06/2023                                 OPERATIVE REPORT         PREOPERATIVE DIAGNOSIS: Left hip femoral neck fracture      POSTOPERATIVE DIAGNOSIS:  Left hip femoral neck fracture      PROCEDURE:  Left total hip replacement through an anterior approach   utilizing DePuy THR system, component size 56 mm pinnacle cup, a size 36+4 neutral   Altrex liner, a size 6 Hi Actis stem with a 36+5 delta ceramic   ball.      SURGEON:  Donnice JONETTA. Car, M.D.      ASSISTANT:  Rosina Calin, PA-C     ANESTHESIA:  General.      SPECIMENS:  None.      COMPLICATIONS:  None.      BLOOD LOSS:  300 cc     DRAINS:  None.      INDICATION OF THE PROCEDURE:  Douglas Carr is a 58 y.o. male who was helping his son push a truck when it got away from him and he fell. He had immediate left hip pain and could not get up.  He was initially transferred to the [facility] emergency room and subsequently transferred to [facility] for definitive treatment.  He was seen and evaluated by cardiology and cleared with increased risk based on his history for left total hip replacement to manage his pain and restore his functional quality life.  Risks include infection DVT dislocation neurovascular injury and the need for future surgeries.  Consent was obtained for management of his left femoral neck fracture.     PROCEDURE IN DETAIL:  The patient was brought to operative theater.   Once adequate anesthesia, preoperative antibiotics, 2 gm of Ancef , 1 gm of Tranexamic Acid , and 10 mg of Decadron  were administered, the patient was positioned supine on the [employer] table.  Once the patient was safely positioned with adequate padding of boney prominences we predraped out the hip, and used  fluoroscopy to confirm orientation of the pelvis.      The left hip was then prepped and draped from proximal iliac crest to   mid thigh with a shower curtain technique.      Time-out was performed identifying the patient, planned procedure, and the appropriate extremity.     An incision was then made 2 cm lateral to the   anterior superior iliac spine extending over the orientation of the   tensor fascia lata muscle and sharp dissection was carried down to the   fascia of the muscle.      The fascia was then incised.  The muscle belly was identified and swept   laterally and retractor placed along the superior neck.  Following   cauterization of the circumflex vessels and removing some pericapsular   fat, a second cobra retractor was placed on the inferior neck.  A T-capsulotomy was made along the line of the   superior neck to the  trochanteric fossa, then extended proximally and   distally.  Tag sutures were placed and the retractors were then placed   intracapsular.  We then identified the trochanteric fossa and   orientation of my neck cut and then made a neck osteotomy with the femur on traction.  The fractured femoral neck segment and femoral head were removed without difficulty or complication.  Traction was let   off and retractors were placed posterior and anterior around the   acetabulum.      The labrum and foveal tissue were debrided.  I began reaming with a 47 mm   reamer and reamed up to 55 mm reamer with good bony bed preparation and a 56 mm  cup was chosen.  The final 56 mm Pinnacle cup was then impacted under fluoroscopy to confirm the depth of penetration and orientation with respect to   Abduction and forward flexion.  A screw was placed into the ilium followed by the hole eliminator.  The final   36+4 neutral Altrex liner was impacted with good visualized rim fit.  The cup was positioned anatomically within the acetabular portion of the pelvis.      At this point, the  femur was rolled to 100 degrees.  Further capsule was   released off the inferior aspect of the femoral neck.  I then   released the superior capsule proximally.  With the leg in a neutral position the hook was placed laterally   along the femur under the vastus lateralis origin and elevated manually and then held in position using the hook attachment on the bed.  The leg was then extended and adducted with the leg rolled to 100   degrees of external rotation.  Retractors were placed along the medial calcar and posteriorly over the greater trochanter.  Once the proximal femur was fully   exposed, I used a box osteotome to set orientation.  I then began   broaching with the starting chili pepper broach and passed this by hand and then broached up to 6.  With the 6 broach in place I chose a high offset neck and did several trial reductions.  The offset was appropriate, leg lengths   appeared to be equal best matched with the +5 head ball trial confirmed radiographically.   Given these findings, I went ahead and dislocated the hip, repositioned all   retractors and positioned the right hip in the extended and abducted position.  The final 6 Hi Actis stem was   chosen and it was impacted down to the level of neck cut.  Based on this   and the trial reductions, a final 36+5 delta ceramic ball was chosen and   impacted onto a clean and dry trunnion, and the hip was reduced.  The   hip had been irrigated throughout the case again at this point.  I did   reapproximate the superior capsular leaflet to the anterior leaflet   using #1 Vicryl.  The fascia of the   tensor fascia lata muscle was then reapproximated using #1 Vicryl and #0 Stratafix sutures.  The   remaining wound was closed with 2-0 Vicryl and running 4-0 Monocryl.   The hip was cleaned, dried, and dressed sterilely using Dermabond and   Aquacel dressing.  The patient was then brought   to recovery room in stable condition tolerating the  procedure well.    Rosina Calin, PA-C was present for the entirety of the case involved from  preoperative positioning, perioperative retractor management, general   facilitation of the case, as well as primary wound closure as assistant.            Donnice CORDOBA Ernie, M.D.        12/06/2023 7:53 AM

## 2023-12-06 NOTE — Anesthesia Procedure Notes (Addendum)
 Procedure Name: Intubation Date/Time: 12/06/2023 8:01 AM  Performed by: Cindie Donald CROME, CRNAPre-anesthesia Checklist: Patient identified, Emergency Drugs available, Suction available and Patient being monitored Patient Re-evaluated:Patient Re-evaluated prior to induction Oxygen Delivery Method: Circle System Utilized Preoxygenation: Pre-oxygenation with 100% oxygen Induction Type: IV induction Ventilation: Mask ventilation without difficulty Laryngoscope Size: Mac and 4 Grade View: Grade I Tube type: Oral Tube size: 7.5 mm Number of attempts: 1 Airway Equipment and Method: Stylet Placement Confirmation: ETT inserted through vocal cords under direct vision, positive ETCO2 and breath sounds checked- equal and bilateral Secured at: 23 cm Tube secured with: Tape Dental Injury: Teeth and Oropharynx as per pre-operative assessment

## 2023-12-06 NOTE — Anesthesia Postprocedure Evaluation (Signed)
 Anesthesia Post Note  Patient: Douglas Carr  Procedure(s) Performed: ARTHROPLASTY, HIP, TOTAL, ANTERIOR APPROACH (Left: Hip) ARTHROPLASTY, HIP, TOTAL, ANTERIOR APPROACH (Left: Hip)     Patient location during evaluation: PACU Anesthesia Type: General Level of consciousness: awake and alert Pain management: pain level controlled Vital Signs Assessment: post-procedure vital signs reviewed and stable Respiratory status: spontaneous breathing, nonlabored ventilation, respiratory function stable and patient connected to nasal cannula oxygen Cardiovascular status: blood pressure returned to baseline and stable Postop Assessment: no apparent nausea or vomiting Anesthetic complications: no   No notable events documented.  Last Vitals:  Vitals:   12/06/23 1030 12/06/23 1056  BP:  (!) 185/77  Pulse:  80  Resp:  16  Temp: 36.9 C 36.4 C  SpO2:  92%    Last Pain:  Vitals:   12/06/23 1056  TempSrc: Oral  PainSc:                  Fathima Bartl D Iraida Cragin

## 2023-12-06 NOTE — Progress Notes (Signed)
 Patient ID: Douglas Carr, male   DOB: 26-Apr-1966, 58 y.o.   MRN: [id number] Displaced left hip femoral neck fracture after ground level fall on 8/1 Unable to get to OR yesterday so I was asked to take over care  Reviewed indications for total hip replacement NPO Consent changed to reflect surgeon change  Cardiology has seen him and notes peri-operative risks but will need to proceed in order to address pain and restore functional quality of life

## 2023-12-06 NOTE — Transfer of Care (Signed)
 Immediate Anesthesia Transfer of Care Note  Patient: Douglas Carr  Procedure(s) Performed: ARTHROPLASTY, HIP, TOTAL, ANTERIOR APPROACH (Left: Hip) ARTHROPLASTY, HIP, TOTAL, ANTERIOR APPROACH (Left: Hip)  Patient Location: PACU  Anesthesia Type:GA combined with regional for post-op pain  Level of Consciousness: drowsy and patient cooperative  Airway & Oxygen Therapy: Patient Spontanous Breathing and Patient connected to nasal cannula oxygen  Post-op Assessment: Report given to RN and Post -op Vital signs reviewed and stable  Post vital signs: Reviewed and stable  Last Vitals:  Vitals Value Taken Time  BP 173/83 12/06/23 09:33  Temp 37.3 C 12/06/23 09:30  Pulse 85 12/06/23 09:35  Resp 14 12/06/23 09:35  SpO2 91 % 12/06/23 09:35  Vitals shown include unfiled device data.  Last Pain:  Vitals:   12/06/23 0717  TempSrc: Oral  PainSc:          Complications: No notable events documented.

## 2023-12-06 NOTE — Discharge Instructions (Signed)

## 2023-12-07 ENCOUNTER — Inpatient Hospital Stay (HOSPITAL_COMMUNITY)

## 2023-12-07 ENCOUNTER — Encounter (HOSPITAL_COMMUNITY): Payer: Self-pay | Admitting: Orthopedic Surgery

## 2023-12-07 DIAGNOSIS — I255 Ischemic cardiomyopathy: Secondary | ICD-10-CM | POA: Diagnosis not present

## 2023-12-07 DIAGNOSIS — S72002A Fracture of unspecified part of neck of left femur, initial encounter for closed fracture: Secondary | ICD-10-CM | POA: Diagnosis not present

## 2023-12-07 DIAGNOSIS — I6523 Occlusion and stenosis of bilateral carotid arteries: Secondary | ICD-10-CM | POA: Diagnosis not present

## 2023-12-07 LAB — GLUCOSE, CAPILLARY
Glucose-Capillary: 194 mg/dL — ABNORMAL HIGH (ref 70–99)
Glucose-Capillary: 265 mg/dL — ABNORMAL HIGH (ref 70–99)
Glucose-Capillary: 301 mg/dL — ABNORMAL HIGH (ref 70–99)
Glucose-Capillary: 323 mg/dL — ABNORMAL HIGH (ref 70–99)
Glucose-Capillary: 351 mg/dL — ABNORMAL HIGH (ref 70–99)
Glucose-Capillary: 411 mg/dL — ABNORMAL HIGH (ref 70–99)

## 2023-12-07 LAB — CBC WITH DIFFERENTIAL/PLATELET
Abs Granulocyte: 9.3 K/uL — ABNORMAL HIGH (ref 1.5–6.5)
Abs Immature Granulocytes: 0.06 K/uL (ref 0.00–0.07)
Basophils Absolute: 0 K/uL (ref 0.0–0.1)
Basophils Relative: 0 %
Eosinophils Absolute: 0.1 K/uL (ref 0.0–0.5)
Eosinophils Relative: 1 %
HCT: 30.9 % — ABNORMAL LOW (ref 39.0–52.0)
Hemoglobin: 9.7 g/dL — ABNORMAL LOW (ref 13.0–17.0)
Immature Granulocytes: 1 %
Lymphocytes Relative: 12 %
Lymphs Abs: 1.5 K/uL (ref 0.7–4.0)
MCH: 26.4 pg (ref 26.0–34.0)
MCHC: 31.4 g/dL (ref 30.0–36.0)
MCV: 84 fL (ref 80.0–100.0)
Monocytes Absolute: 1.6 K/uL — ABNORMAL HIGH (ref 0.1–1.0)
Monocytes Relative: 13 %
Neutro Abs: 9.3 K/uL — ABNORMAL HIGH (ref 1.7–7.7)
Neutrophils Relative %: 73 %
Platelets: 151 K/uL (ref 150–400)
RBC: 3.68 MIL/uL — ABNORMAL LOW (ref 4.22–5.81)
RDW: 13.4 % (ref 11.5–15.5)
WBC: 12.6 K/uL — ABNORMAL HIGH (ref 4.0–10.5)
nRBC: 0 % (ref 0.0–0.2)

## 2023-12-07 LAB — BASIC METABOLIC PANEL WITH GFR
Anion gap: 9 (ref 5–15)
BUN: 26 mg/dL — ABNORMAL HIGH (ref 6–20)
CO2: 21 mmol/L — ABNORMAL LOW (ref 22–32)
Calcium: 8 mg/dL — ABNORMAL LOW (ref 8.9–10.3)
Chloride: 107 mmol/L (ref 98–111)
Creatinine, Ser: 2.05 mg/dL — ABNORMAL HIGH (ref 0.61–1.24)
GFR, Estimated: 37 mL/min — ABNORMAL LOW (ref >60)
Glucose, Bld: 210 mg/dL — ABNORMAL HIGH (ref 70–99)
Potassium: 4.9 mmol/L (ref 3.5–5.1)
Sodium: 137 mmol/L (ref 135–145)

## 2023-12-07 LAB — ECHOCARDIOGRAM COMPLETE
Area-P 1/2: 5.02 cm2
Height: 70 in
S' Lateral: 4.76 cm
Weight: 3227.53 [oz_av]

## 2023-12-07 LAB — HEMOGLOBIN A1C
Hgb A1c MFr Bld: 13.4 % — ABNORMAL HIGH (ref 4.8–5.6)
Mean Plasma Glucose: 338 mg/dL

## 2023-12-07 MED ORDER — INSULIN GLARGINE-YFGN 100 UNIT/ML ~~LOC~~ SOLN
10.0000 [IU] | Freq: Every day | SUBCUTANEOUS | Status: DC
  Administered 2023-12-07: 10 [IU] via SUBCUTANEOUS
  Filled 2023-12-07 (×2): qty 0.1

## 2023-12-07 MED ORDER — INSULIN ASPART 100 UNIT/ML IJ SOLN
0.0000 [IU] | Freq: Every day | INTRAMUSCULAR | Status: DC
  Administered 2023-12-07: 5 [IU] via SUBCUTANEOUS

## 2023-12-07 MED ORDER — ASPIRIN 81 MG PO TBEC
81.0000 mg | DELAYED_RELEASE_TABLET | Freq: Every day | ORAL | Status: DC
  Administered 2023-12-07 – 2023-12-08 (×2): 81 mg via ORAL
  Filled 2023-12-07 (×2): qty 1

## 2023-12-07 MED ORDER — CLOPIDOGREL BISULFATE 75 MG PO TABS
75.0000 mg | ORAL_TABLET | Freq: Every evening | ORAL | Status: DC
Start: 2023-12-07 — End: 2023-12-08
  Administered 2023-12-07: 75 mg via ORAL
  Filled 2023-12-07: qty 1

## 2023-12-07 MED ORDER — INSULIN ASPART 100 UNIT/ML IJ SOLN: 0.0000 [IU] | Freq: Three times a day (TID) | INTRAMUSCULAR | Status: DC

## 2023-12-07 MED ORDER — INSULIN ASPART 100 UNIT/ML IJ SOLN
3.0000 [IU] | Freq: Three times a day (TID) | INTRAMUSCULAR | Status: DC
  Administered 2023-12-08 (×2): 3 [IU] via SUBCUTANEOUS

## 2023-12-07 MED ORDER — INSULIN ASPART 100 UNIT/ML IJ SOLN
0.0000 [IU] | Freq: Three times a day (TID) | INTRAMUSCULAR | Status: DC
  Administered 2023-12-07: 15 [IU] via SUBCUTANEOUS
  Administered 2023-12-08: 5 [IU] via SUBCUTANEOUS
  Administered 2023-12-08: 15 [IU] via SUBCUTANEOUS

## 2023-12-07 NOTE — Plan of Care (Signed)
  Problem: Health Behavior/Discharge Planning: Goal: Ability to manage health-related needs will improve Outcome: Progressing   Problem: Clinical Measurements: Goal: Ability to maintain clinical measurements within normal limits will improve Outcome: Progressing Goal: Respiratory complications will improve Outcome: Progressing

## 2023-12-07 NOTE — TOC Initial Note (Signed)
 Transition of Care [facility]) - Initial/Assessment Note    Patient Details  Name: Douglas Carr MRN: [id number] Date of Birth: 05/02/1966  [facility] CM/SW Contact:    Lendia Dais, LCSW Phone Number: 12/07/2023, 4:12 PM  Clinical Narrative: Pt is from home and lives with adult son Darlyn. Pt gave CSW permission to conact son Darlyn and daughter [name].   CSW spoke to pt about PT rec of SNF and the pt was agreeable. Pt stated a preference of a SNF in the [place] area. CSW gave the pt the medicare choice list for SNF's and pt gave permission to send out referral in the hub.   CSW will send out referral and monitor for bed offers.                    Expected Discharge Plan: Skilled Nursing Facility Barriers to Discharge: Continued Medical Work up   Patient Goals and CMS Choice Patient states their goals for this hospitalization and ongoing recovery are:: No more falls CMS Medicare.gov Compare Post Acute Care list provided to:: Patient Choice offered to / list presented to : Patient      Expected Discharge Plan and Services In-house Referral: Clinical Social Work     Living arrangements for the past 2 months: Single Family Home                                      Prior Living Arrangements/Services Living arrangements for the past 2 months: Single Family Home Lives with:: Adult Children (Son Darlyn) Patient language and need for interpreter reviewed:: Yes Do you feel safe going back to the place where you live?: Yes      Need for Family Participation in Patient Care: No (Comment) Care giver support system in place?: No (comment)   Criminal Activity/Legal Involvement Pertinent to Current Situation/Hospitalization: No - Comment as needed  Activities of Daily Living   ADL Screening (condition at time of admission) Independently performs ADLs?: No Does the patient have a NEW difficulty with bathing/dressing/toileting/self-feeding that is expected  to last >3 days?: No Does the patient have a NEW difficulty with getting in/out of bed, walking, or climbing stairs that is expected to last >3 days?: No Does the patient have a NEW difficulty with communication that is expected to last >3 days?: No Is the patient deaf or have difficulty hearing?: No Does the patient have difficulty seeing, even when wearing glasses/contacts?: No Does the patient have difficulty concentrating, remembering, or making decisions?: No  Permission Sought/Granted Permission sought to share information with : Family Supports Permission granted to share information with : Yes, Verbal Permission Granted  Share Information with NAME: Hon,savannah (Daughter)  [phone], Darlyn [phone]           Emotional Assessment Appearance:: Appears stated age Attitude/Demeanor/Rapport: Engaged Affect (typically observed): Appropriate, Pleasant Orientation: : Oriented to Self, Oriented to Place, Oriented to  Time, Oriented to Situation Alcohol / Substance Use: Not Applicable Psych Involvement: No (comment)  Admission diagnosis:  Closed fracture of left hip, initial encounter (HCC) [S72.002A] Closed left hip fracture, initial encounter (HCC) [S72.002A] Patient Active Problem List   Diagnosis Date Noted   S/P total left hip arthroplasty 12/06/2023   Acute renal failure superimposed on stage 3a chronic kidney disease (HCC) 12/04/2023   Chronic HFrEF (heart failure with reduced ejection fraction) (HCC) 12/04/2023   Prolonged QT interval 12/04/2023  Malnutrition of moderate degree 12/04/2023   Closed left hip fracture, initial encounter (HCC) 12/03/2023   Bilateral carotid artery disease (HCC) 05/26/2021   CKD (chronic kidney disease), stage III (HCC) 05/26/2021   ICD (implantable cardioverter-defibrillator) in place 04/13/2020   PVD (peripheral vascular disease) (HCC) 04/13/2020   S/P CABG x 5 01/27/2020   Dyslipidemia, goal LDL below 70 03/31/2018   Anxiety  and depression 11/19/2016   Ischemic cardiomyopathy 06/30/2016   Diabetes mellitus with complication (HCC)    Type 2 diabetes mellitus with obesity (HCC)    Anoxic brain injury (HCC)    Dilated cardiomyopathy (HCC) 05/24/2016   Coronary artery disease involving native coronary artery of native heart with angina pectoris (HCC) 05/24/2016   Chronic combined systolic and diastolic HF (heart failure), 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 05/13/2016   Status post coronary artery stent placement    Essential hypertension 09/21/2013   Pulmonary nodules 10/16/2011   PCP:  [name], [name], MD Pharmacy:   [phone] [place] [place] - [address] [place] [address] Phone: [phone] Fax: [phone]  [id number] - [address] [facility], [place] - [address] [place] [address] [place] [address] Phone: [phone] Fax: [phone]     Social Drivers of Health (SDOH) Social History: SDOH Screenings   Food Insecurity: No Food Insecurity (12/04/2023)  Housing: Low Risk  (12/04/2023)  Transportation Needs: No Transportation Needs (12/04/2023)  Utilities: Not At Risk (12/04/2023)  Alcohol Screen: Low Risk  (07/24/2021)  Depression (PHQ2-9): Low Risk  (12/03/2023)  Financial Resource Strain: Low Risk  (07/24/2021)  Physical Activity: Sufficiently Active (07/24/2021)  Social Connections: Moderately Integrated (07/24/2021)  Stress: No Stress Concern Present (07/24/2021)  Tobacco Use: High Risk (12/06/2023)   SDOH Interventions:     Readmission Risk Interventions     No data to display
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
## 2023-12-07 NOTE — Plan of Care (Addendum)
@  1628 Patient CBG 411, notified Rashid MD  Problem: Education: Goal: Knowledge of General Education information will improve Description: Including pain rating scale, medication(s)/side effects and non-pharmacologic comfort measures Outcome: Progressing   Problem: Activity: Goal: Risk for activity intolerance will decrease Outcome: Progressing   Problem: Pain Managment: Goal: General experience of comfort will improve and/or be controlled Outcome: Progressing   Problem: Safety: Goal: Ability to remain free from injury will improve Outcome: Progressing   Problem: Skin Integrity: Goal: Risk for impaired skin integrity will decrease Outcome: Progressing

## 2023-12-07 NOTE — Inpatient Diabetes Management (Signed)
 Inpatient Diabetes Program Recommendations  AACE/ADA: New Consensus Statement on Inpatient Glycemic Control   Target Ranges:  Prepandial:   less than 140 mg/dL      Peak postprandial:   less than 180 mg/dL (1-2 hours)      Critically ill patients:  140 - 180 mg/dL    Latest Reference Range & Units 12/04/23 01:22 12/04/23 04:32 12/04/23 08:43  Glucose-Capillary 70 - 99 mg/dL 633 (H) 766 (H) 755 (H)    Latest Reference Range & Units 12/03/23 13:02 12/03/23 16:15 12/04/23 02:48  Glucose 70 - 99 mg/dL 655 (H) 605 (H) 681 (H)   Review of Glycemic Control  Diabetes history: DM2 Outpatient Diabetes medications: Lantus  11 units at bedtime, Ozmepic 0.25 mg Qweek Current orders for Inpatient glycemic control:  Semglee  5 units at bedtime Novolog  0-6 units Q4H  Note : pt received Decadron  10 mg this am  Inpatient Diabetes Program Recommendations:    -   Increase Semglee  to 8 units -   Add Novolog  2 units tid meal coverage if eating >50% of meals  Thanks, Clotilda Bull RN, MSN, BC-ADM Inpatient Diabetes Coordinator Team Pager [phone] (8a-5p)

## 2023-12-07 NOTE — Progress Notes (Signed)
   Repeat echo remains pending- will follow-up on echo and med changes tomorrow. There does not appear to be any report of peri-operative cardiac complications.  Vinie KYM Maxcy, MD, [facility], FNLA, FACP  [place]  [facility]  Medical Director of the Advanced Lipid Disorders &  Cardiovascular Risk Reduction Clinic Diplomate of the American Board of Clinical Lipidology Attending Cardiologist  Direct Dial: [phone]  Fax: [phone]  Website:  [email or web address]

## 2023-12-07 NOTE — NC FL2 (Signed)
 [place]  MEDICAID FL2 LEVEL OF CARE FORM     IDENTIFICATION  Patient Name: ALPHONSE ASBRIDGE Birthdate: [birth year] Sex: male Admission Date (Current Location): 12/03/2023  [facility] and [name] Number:  Producer, television/film/video and Address:  [facility] [place], [address] [place], [place] [address]      Provider Number: [id number]  Attending Physician Name and Address:  Dino Antu, MD  Relative Name and Phone Number:       Current Level of Care: Hospital Recommended Level of Care: Skilled Nursing Facility Prior Approval Number:    Date Approved/Denied:   PASRR Number:    Discharge Plan: SNF    Current Diagnoses: Patient Active Problem List   Diagnosis Date Noted   S/P total left hip arthroplasty 12/06/2023   Acute renal failure superimposed on stage 3a chronic kidney disease (HCC) 12/04/2023   Chronic HFrEF (heart failure with reduced ejection fraction) (HCC) 12/04/2023   Prolonged QT interval 12/04/2023   Malnutrition of moderate degree 12/04/2023   Closed left hip fracture, initial encounter (HCC) 12/03/2023   Bilateral carotid artery disease (HCC) 05/26/2021   CKD (chronic kidney disease), stage III (HCC) 05/26/2021   ICD (implantable cardioverter-defibrillator) in place 04/13/2020   PVD (peripheral vascular disease) (HCC) 04/13/2020   S/P CABG x 5 01/27/2020   Dyslipidemia, goal LDL below 70 03/31/2018   Anxiety and depression 11/19/2016   Ischemic cardiomyopathy 06/30/2016   Diabetes mellitus with complication (HCC)    Type 2 diabetes mellitus with obesity (HCC)    Anoxic brain injury (HCC)    Dilated cardiomyopathy (HCC) 05/24/2016   Coronary artery disease involving native coronary artery of native heart with angina pectoris (HCC) 05/24/2016   Chronic combined systolic and diastolic HF (heart failure), 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 05/13/2016   Status post coronary artery stent placement    Essential hypertension  09/21/2013   Pulmonary nodules 10/16/2011    Orientation RESPIRATION BLADDER Height & Weight     Self, Time, Situation, Place  Normal Continent, Indwelling catheter Weight: 201 lb 11.5 oz (91.5 kg) Height:  5' 10 (177.8 cm)  BEHAVIORAL SYMPTOMS/MOOD NEUROLOGICAL BOWEL NUTRITION STATUS      Continent Diet (see dc summary)  AMBULATORY STATUS COMMUNICATION OF NEEDS Skin   Total Care Verbally Surgical wounds                       Personal Care Assistance Level of Assistance  Bathing, Feeding, Dressing Bathing Assistance: Maximum assistance Feeding assistance: Limited assistance Dressing Assistance: Maximum assistance     Functional Limitations Info  Sight, Hearing, Speech Sight Info: Impaired Hearing Info: Adequate Speech Info: Adequate    SPECIAL CARE FACTORS FREQUENCY  PT (By licensed PT), OT (By licensed OT)     PT Frequency: 5x a week OT Frequency: 5x week            Contractures      Additional Factors Info  Code Status, Allergies, Insulin  Sliding Scale Code Status Info: full Allergies Info: NKA   Insulin  Sliding Scale Info: novolog        Current Medications (12/07/2023):  This is the current hospital active medication list Current Facility-Administered Medications  Medication Dose Route Frequency Provider Last Rate Last Admin   0.9 %  sodium chloride  infusion   Intravenous Continuous Donovan, Ashley R, PA-C 75 mL/hr at 12/06/23 1756 Infusion Verify at 12/06/23 1756   acetaminophen  (TYLENOL ) tablet 1,000 mg  1,000 mg Oral Q6H  Patti Rosina SAUNDERS, PA-C   1,000 mg at 12/07/23 1312   alum & mag hydroxide-simeth (MAALOX/MYLANTA) 200-200-20 MG/5ML suspension 30 mL  30 mL Oral Q4H PRN Patti Rosina SAUNDERS, PA-C       aspirin  EC tablet 81 mg  81 mg Oral Daily Patti Rosina SAUNDERS, PA-C   81 mg at 12/07/23 9160   bisacodyl  (DULCOLAX) suppository 10 mg  10 mg Rectal Daily PRN Patti Rosina SAUNDERS, PA-C       carvedilol  (COREG ) tablet 3.125 mg  3.125 mg Oral Q12H Patti Rosina SAUNDERS, PA-C   3.125 mg at 12/07/23 9160   Chlorhexidine  Gluconate Cloth 2 % PADS 6 each  6 each Topical Daily Patti Rosina SAUNDERS, PA-C   6 each at 12/07/23 1617   cholecalciferol  (VITAMIN D3) 25 MCG (1000 UNIT) tablet 2,000 Units  2,000 Units Oral Daily Patti Rosina SAUNDERS, PA-C   2,000 Units at 12/07/23 9160   clopidogrel  (PLAVIX ) tablet 75 mg  75 mg Oral QPM Patti Rosina SAUNDERS, PA-C       diphenhydrAMINE  (BENADRYL ) 12.5 MG/5ML elixir 12.5-25 mg  12.5-25 mg Oral Q4H PRN Patti Rosina SAUNDERS, PA-C       hydrALAZINE  (APRESOLINE ) tablet 10 mg  10 mg Oral Q8H Patti Rosina SAUNDERS, PA-C   10 mg at 12/07/23 1312   HYDROmorphone  (DILAUDID ) injection 0.5-1 mg  0.5-1 mg Intravenous Q4H PRN Patti Rosina SAUNDERS, PA-C   1 mg at 12/07/23 1317   insulin  aspart (novoLOG ) injection 0-6 Units  0-6 Units Subcutaneous Q4H Patti Rosina SAUNDERS, PA-C   4 Units at 12/07/23 1312   insulin  glargine-yfgn (SEMGLEE ) injection 5 Units  5 Units Subcutaneous QHS Patti Rosina SAUNDERS, PA-C   5 Units at 12/06/23 2103   isosorbide  dinitrate (ISORDIL ) tablet 5 mg  5 mg Oral BID Patti Rosina SAUNDERS, PA-C   5 mg at 12/07/23 9160   menthol -cetylpyridinium (CEPACOL) lozenge 3 mg  1 lozenge Oral PRN Patti Rosina SAUNDERS, PA-C       Or   phenol (CHLORASEPTIC) mouth spray 1 spray  1 spray Mouth/Throat PRN Patti Rosina SAUNDERS, PA-C       methocarbamol  (ROBAXIN ) tablet 500 mg  500 mg Oral Q6H PRN Patti Rosina SAUNDERS, PA-C   500 mg at 12/07/23 1105   Or   methocarbamol  (ROBAXIN ) injection 500 mg  500 mg Intravenous Q6H PRN Patti Rosina SAUNDERS, PA-C       metoCLOPramide  (REGLAN ) tablet 5-10 mg  5-10 mg Oral Q8H PRN Patti Rosina SAUNDERS, PA-C       Or   metoCLOPramide  (REGLAN ) injection 5-10 mg  5-10 mg Intravenous Q8H PRN Patti Rosina SAUNDERS, PA-C       mirtazapine  (REMERON ) tablet 7.5 mg  7.5 mg Oral QHS Patti Rosina SAUNDERS, PA-C   7.5 mg at 12/06/23 2104   multivitamin with minerals tablet 1 tablet  1 tablet Oral Daily Patti Rosina SAUNDERS, PA-C   1 tablet at 12/07/23 9160    oxyCODONE  (Oxy IR/ROXICODONE ) immediate release tablet 2.5-5 mg  2.5-5 mg Oral Q4H PRN Patti Rosina SAUNDERS, PA-C   5 mg at 12/07/23 0447   oxyCODONE  (Oxy IR/ROXICODONE ) immediate release tablet 5-10 mg  5-10 mg Oral Q4H PRN Patti Rosina SAUNDERS, PA-C   10 mg at 12/07/23 1616   polyethylene glycol (MIRALAX  / GLYCOLAX ) packet 17 g  17 g Oral BID Patti Rosina SAUNDERS, PA-C   17 g at 12/07/23 9160   rosuvastatin  (CRESTOR ) tablet 40 mg  40 mg Oral Daily Patti Rosina  R, PA-C   40 mg at 12/07/23 0839   senna (SENOKOT) tablet 17.2 mg  2 tablet Oral QHS Patti Rosina SAUNDERS, PA-C   17.2 mg at 12/06/23 2104   tamsulosin  (FLOMAX ) capsule 0.4 mg  0.4 mg Oral Daily Patti Rosina SAUNDERS, PA-C   0.4 mg at 12/07/23 9160   trimethobenzamide  (TIGAN ) injection 200 mg  200 mg Intramuscular Q6H PRN Patti Rosina SAUNDERS, PA-C         Discharge Medications: Please see discharge summary for a list of discharge medications.  Relevant Imaging Results:  Relevant Lab Results:   Additional Information SSN [id number]  [place], LCSW

## 2023-12-07 NOTE — Progress Notes (Signed)
 Carotid duplex has been completed.   Results can be found under chart review under CV PROC. 12/07/2023 1:53 PM Braiden Rodman RVT, RDMS

## 2023-12-07 NOTE — Progress Notes (Signed)
 RE: Douglas Carr Date of Birth: 1966/04/10 Date: 12/07/2023   To Whom It May Concern:  Please be advised that the above-named patient will require a short-term nursing home stay - anticipated 30 days or less for rehabilitation and strengthening.  The plan is for return home.                 MD signature

## 2023-12-07 NOTE — Progress Notes (Signed)
 Subjective: 1 Day Post-Op Procedure(s) (LRB): ARTHROPLASTY, HIP, TOTAL, ANTERIOR APPROACH (Left) Patient reports pain as mild.   Patient seen in rounds for Dr. Ernie. Patient is resting in bed on exam this morning. No acute events overnight. He reports pain is fairly well controlled with current meds. He tells me he lives alone and has weakness at baseline due to history of stroke.  We will start therapy today.   Objective: Vital signs in last 24 hours: Temp:  [97.6 F (36.4 C)-99.3 F (37.4 C)] 98.8 F (37.1 C) (08/04 0518) Pulse Rate:  [80-97] 92 (08/04 0518) Resp:  [10-18] 17 (08/04 0518) BP: (149-207)/(61-84) 152/67 (08/04 0518) SpO2:  [90 %-100 %] 93 % (08/04 0518) Weight:  [91.5 kg] 91.5 kg (08/04 0500)  Intake/Output from previous day:  Intake/Output Summary (Last 24 hours) at 12/07/2023 0714 Last data filed at 12/07/2023 0525 Gross per 24 hour  Intake 1447.88 ml  Output 1750 ml  Net -302.12 ml     Intake/Output this shift: No intake/output data recorded.  Labs: Recent Labs    12/05/23 0807 12/06/23 0540  HGB 11.4* 11.6*   Recent Labs    12/05/23 0807 12/06/23 0540  WBC 8.9 11.8*  RBC 4.28 4.30  HCT 35.7* 35.5*  PLT 152 155   Recent Labs    12/05/23 0807 12/06/23 0540  NA 139 136  K 4.4 4.3  CL 109 105  CO2 22 22  BUN 28* 27*  CREATININE 2.37* 2.07*  GLUCOSE 174* 297*  CALCIUM  8.3* 8.1*   No results for input(s): LABPT, INR in the last 72 hours.  Exam: General - Patient is Alert and Appropriate Extremity - Neurologically intact Sensation intact distally Intact pulses distally Dorsiflexion/Plantar flexion intact Dressing - dressing C/D/I Motor Function - intact, moving foot and toes well on exam.   Past Medical History:  Diagnosis Date   Acute ST elevation myocardial infarction (STEMI) involving left circumflex coronary artery (HCC) 05/07/2016   PCI to Cx-OM   AICD (automatic cardioverter/defibrillator) present    [employer]    Anginal pain (HCC)    secondary to sm. 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 05/13/2016   Coronary artery disease involving native coronary artery of native heart with angina pectoris (HCC) 05/24/2016   Remote MI at 58 years of age, Last cath 2010-diffuse non-obstructive disease; last echo 06/16/08 -normal LV function, moderate concentric hypertrophy; nuc 08/2008 no ischemia;  medical therapy; STEMI May 07 2016 - PCI to Cx-OM   Diabetes mellitus    ON ORAL MEDICATION AND INSULIN    Dilated cardiomyopathy (HCC) 05/24/2016   EF 325-30% by Echo post STEMI (previously 30-35%)    Hyperlipidemia    Hypertension    Myocardial infarction (HCC) 1996   Post MI   Peripheral vascular disease (HCC)    HAS LEFT CAROTID ARTERY STENOSIS   AND IS S/P RIGHT CAROTID ENDARTERECTOMY 2010 Last carotid dopplers 01/08/2012 wth patent endarterectomy site   Status post coronary artery stent placement     Assessment/Plan: 1 Day Post-Op Procedure(s) (LRB): ARTHROPLASTY, HIP, TOTAL, ANTERIOR APPROACH (Left) Principal Problem:   Closed left hip fracture, initial encounter [facility]) Active Problems:   Coronary artery disease involving native coronary artery of native heart with angina  pectoris (HCC)   Type 2 diabetes mellitus with obesity (HCC)   Acute renal failure superimposed on stage 3a chronic kidney disease (HCC)   Chronic HFrEF (heart failure with reduced ejection fraction) (HCC)   Prolonged QT interval   Malnutrition of moderate degree   S/P total left hip arthroplasty  Estimated body mass index is 28.94 kg/m as calculated from the following:   Height as of this encounter: 5' 10 (1.778 m).   Weight as of this encounter: 91.5 kg. Advance diet Up with therapy  DVT Prophylaxis - Aspirin  & plavix  to resume today  Weight  bearing as tolerated.  Labs not in yet this morning, awaiting CBC/BMP  Up with PT today Disposition per recommendations and progress in the next day or two   Rosina Calin, PA-C Orthopedic Surgery [phone] 12/07/2023, 7:14 AM

## 2023-12-07 NOTE — Evaluation (Signed)
 Occupational Therapy Evaluation Patient Details Name: Douglas Carr MRN: [id number] DOB: 1965-08-13 Today's Date: 12/07/2023   History of Present Illness   Pt is 58 yo male who presents on 12/03/23   after a fall outside resulting in ability to bear wt on LLE. Pt with L femoral neck fx, underwent L anterior THA on 12/06/23. Pt with PMH: R carotid endarterectomy, HTN, CAD, CABG, CHF, ICD, DM, kidney disease, CVA, absent R eye.     Clinical Impressions Pt ind at baseline with ADLs/functional mobility, lives with his son who can provide PRN assist. Pt currently needs up to mod A for ADLs, min A for bed mobility and min A for transfers with RW. Pt with shuffling steps, per chart review is baseline. Pt able to reach down toward feet for simulated LB ADL. Pt presenting with impairments listed below, will follow acutely. Patient will benefit from continued inpatient follow up therapy, <3 hours/day to maximize safety/ind with ADLs/functional mobility pending progression.      If plan is discharge home, recommend the following:   A little help with walking and/or transfers;A lot of help with bathing/dressing/bathroom;Assistance with cooking/housework;Assist for transportation;Help with stairs or ramp for entrance;Direct supervision/assist for medications management;Direct supervision/assist for financial management     Functional Status Assessment   Patient has had a recent decline in their functional status and demonstrates the ability to make significant improvements in function in a reasonable and predictable amount of time.     Equipment Recommendations   Other (comment);BSC/3in1 (RW)     Recommendations for Other Services   PT consult     Precautions/Restrictions   Precautions Precautions: Fall Recall of Precautions/Restrictions: Intact Precaution/Restrictions Comments: pt verbalizes his fall risk but still tends to be impulsive Restrictions Weight Bearing Restrictions  Per Provider Order: Yes LLE Weight Bearing Per Provider Order: Weight bearing as tolerated     Mobility Bed Mobility Overal bed mobility: Needs Assistance Bed Mobility: Supine to Sit     Supine to sit: Min assist     General bed mobility comments: min A needed to LLE to come to EOB    Transfers Overall transfer level: Needs assistance Equipment used: Rolling walker (2 wheels) Transfers: Sit to/from Stand Sit to Stand: Min assist           General transfer comment: shuffling steps at times      Balance Overall balance assessment: Needs assistance, History of Falls Sitting-balance support: No upper extremity supported, Feet supported Sitting balance-Leahy Scale: Good     Standing balance support: Bilateral upper extremity supported, During functional activity, Reliant on assistive device for balance Standing balance-Leahy Scale: Poor Standing balance comment: heavily reliant on UE support                           ADL either performed or assessed with clinical judgement   ADL Overall ADL's : Needs assistance/impaired Eating/Feeding: Set up   Grooming: Set up   Upper Body Bathing: Minimal assistance;Sitting   Lower Body Bathing: Moderate assistance;Sitting/lateral leans   Upper Body Dressing : Minimal assistance;Sitting   Lower Body Dressing: Moderate assistance;Sitting/lateral leans   Toilet Transfer: Minimal assistance   Toileting- Clothing Manipulation and Hygiene: Contact guard assist;Sitting/lateral lean       Functional mobility during ADLs: Minimal assistance;Rolling walker (2 wheels)       Vision   Additional Comments: 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     Perception Perception: Not tested  Praxis Praxis: Not tested       Pertinent Vitals/Pain Pain Assessment Pain Assessment: Faces Pain Score: 3  Faces Pain Scale: Hurts little more Pain Location: L hip Pain Descriptors / Indicators: Discomfort Pain Intervention(s): Limited  activity within patient's tolerance, Monitored during session     Extremity/Trunk Assessment Upper Extremity Assessment Upper Extremity Assessment: Generalized weakness (mild tremors noted that pt states is from stroke in the past)   Lower Extremity Assessment Lower Extremity Assessment: Defer to PT evaluation LLE Deficits / Details: hip flex 2-/5, knee ext 2+/5 LLE Sensation: history of peripheral neuropathy;decreased proprioception LLE Coordination: decreased gross motor   Cervical / Trunk Assessment Cervical / Trunk Assessment: Normal   Communication Communication Communication: No apparent difficulties   Cognition Arousal: Alert Behavior During Therapy: WFL for tasks assessed/performed Cognition: History of cognitive impairments (hx of CVA and PTSD per pt)                               Following commands: Intact       Cueing  General Comments   Cueing Techniques: Verbal cues  VSS   Exercises     Shoulder Instructions      Home Living Family/patient expects to be discharged to:: Private residence Living Arrangements: Children (son) Available Help at Discharge: Family;Available PRN/intermittently Type of Home: House Home Access: Stairs to enter [employer] of Steps: 4 Entrance Stairs-Rails: Right;Left;Can reach both Home Layout: One level     Bathroom Shower/Tub: Chief Strategy Officer: Handicapped height Bathroom Accessibility: Yes How Accessible: Accessible via wheelchair Home Equipment: Grab bars - tub/shower;Cane - single point;Hand held shower head   Additional Comments: RN comes out every Wed      Prior Functioning/Environment Prior Level of Function : Independent/Modified Independent;History of Falls (last six months)             Mobility Comments: daughter reports that he shuffles his feet (has neuropathy) and falls intermittently ADLs Comments: independent, ind with meals and med mgmt    OT Problem  List: Decreased strength;Decreased range of motion;Decreased activity tolerance;Impaired balance (sitting and/or standing);Decreased safety awareness;Decreased knowledge of precautions   OT Treatment/Interventions: Self-care/ADL training;Therapeutic exercise;Energy conservation;DME and/or AE instruction;Therapeutic activities;Balance training;Patient/family education      OT Goals(Current goals can be found in the care plan section)   Acute Rehab OT Goals Patient Stated Goal: none stated OT Goal Formulation: With patient Time For Goal Achievement: 12/21/23 Potential to Achieve Goals: Good ADL Goals Pt Will Perform Upper Body Dressing: with modified independence;sitting Pt Will Perform Lower Body Dressing: with modified independence;sitting/lateral leans;sit to/from stand Pt Will Transfer to Toilet: with modified independence;ambulating;regular height toilet Pt Will Perform Tub/Shower Transfer: Tub transfer;Shower transfer;with modified independence;ambulating   OT Frequency:  Min 2X/week    Co-evaluation              AM-PAC OT 6 Clicks Daily Activity     Outcome Measure Help from another person eating meals?: None Help from another person taking care of personal grooming?: A Little Help from another person toileting, which includes using toliet, bedpan, or urinal?: A Little Help from another person bathing (including washing, rinsing, drying)?: A Little Help from another person to put on and taking off regular upper body clothing?: A Little Help from another person to put on and taking off regular lower body clothing?: A Lot 6 Click Score: 18   End of Session Equipment Utilized During  Treatment: Gait belt;Rolling walker (2 wheels) Nurse Communication: Patient requests pain meds;Mobility status  Activity Tolerance: Patient tolerated treatment well Patient left: with call bell/phone within reach;in chair;with chair alarm set  OT Visit Diagnosis: Unsteadiness on feet  (R26.81);Other abnormalities of gait and mobility (R26.89);Muscle weakness (generalized) (M62.81)                Time: 8488-8452 OT Time Calculation (min): 36 min Charges:  OT General Charges $OT Visit: 1 Visit OT Evaluation $OT Eval Moderate Complexity: 1 Mod OT Treatments $Self Care/Home Management : 8-22 mins  Jazzman Loughmiller K, OTD, OTR/L SecureChat Preferred Acute Rehab [phone]   Laneta MARLA Pereyra 12/07/2023, 4:39 PM

## 2023-12-07 NOTE — Evaluation (Signed)
 Physical Therapy Evaluation Patient Details Name: Douglas Carr MRN: [id number] DOB: January 31, 1966 Today's Date: 12/07/2023  History of Present Illness  Pt is 58 yo male who presents on 12/03/23   after a fall outside resulting in ability to bear wt on LLE. Pt with L femoral neck fx, underwent L anterior THA on 12/06/23. Pt with PMH: R carotid endarterectomy, HTN, CAD, CABG, CHF, ICD, DM, kidney disease, CVA, absent R eye.  Clinical Impression  Pt admitted with above diagnosis. Pt from home alone, has family support but only intermittently. Has a h/o falls at home, this one related to pt helping son with a truck. Pt currently needing mod A to mobilize and had difficulty stepping LLE. Pt able to pivot to chair with RW. Recommend OT eval. Patient will benefit from continued inpatient follow up therapy, <3 hours/day before returning home.  Pt currently with functional limitations due to the deficits listed below (see PT Problem List). Pt will benefit from acute skilled PT to increase their independence and safety with mobility to allow discharge.           If plan is discharge home, recommend the following: A lot of help with walking and/or transfers;A lot of help with bathing/dressing/bathroom;Assistance with cooking/housework;Help with stairs or ramp for entrance;Assist for transportation   Can travel by private vehicle   Yes    Equipment Recommendations None recommended by PT  Recommendations for Other Services  OT consult    Functional Status Assessment Patient has had a recent decline in their functional status and demonstrates the ability to make significant improvements in function in a reasonable and predictable amount of time.     Precautions / Restrictions Precautions Precautions: Fall Recall of Precautions/Restrictions: Intact Precaution/Restrictions Comments: pt verbalizes his fall risk but still tends to be impulsive Restrictions Weight Bearing Restrictions Per Provider Order:  Yes      Mobility  Bed Mobility Overal bed mobility: Needs Assistance Bed Mobility: Supine to Sit     Supine to sit: Min assist     General bed mobility comments: min A needed to LLE to come to EOB    Transfers Overall transfer level: Needs assistance Equipment used: Rolling walker (2 wheels) Transfers: Sit to/from Stand, Bed to chair/wheelchair/BSC Sit to Stand: Mod assist   Step pivot transfers: Mod assist       General transfer comment: mod A for power up and to stabilize, vc's for hand placement. Pt able to pick up RLE but had great diffiiculty picking up LLE. No buckling of L knee when lifting RLE.    Ambulation/Gait               General Gait Details: could not advance ambulation due to pain and difficulty stepping LLE  Stairs            Wheelchair Mobility     Tilt Bed    Modified Rankin (Stroke Patients Only)       Balance Overall balance assessment: Needs assistance, History of Falls Sitting-balance support: No upper extremity supported, Feet supported Sitting balance-Leahy Scale: Good     Standing balance support: Bilateral upper extremity supported, During functional activity, Reliant on assistive device for balance Standing balance-Leahy Scale: Poor Standing balance comment: heavily reliant on UE support                             Pertinent Vitals/Pain Pain Assessment Pain Assessment: Faces Faces Pain Scale: Hurts even  more Pain Location: L hip Pain Descriptors / Indicators: Operative site guarding, Sore Pain Intervention(s): Limited activity within patient's tolerance, Monitored during session, Premedicated before session    Home Living Family/patient expects to be discharged to:: Private residence Living Arrangements: Alone Available Help at Discharge: Family;Available PRN/intermittently Type of Home: House Home Access: Stairs to enter Entrance Stairs-Rails: Right;Left;Can reach both Entrance Stairs-Number of  Steps: 4   Home Layout: One level Home Equipment: Rolling Walker (2 wheels);Grab bars - tub/shower;Cane - single point Additional Comments: daughter can assist pt intermittently and she is a Lawyer but she is also a Archivist at [employer] and plays softball    Prior Function Prior Level of Function : Independent/Modified Independent;History of Falls (last six months)             Mobility Comments: daughter reports that he shuffles his feet (has neuropathy) and falls intermittently ADLs Comments: independent     Extremity/Trunk Assessment   Upper Extremity Assessment Upper Extremity Assessment: Defer to OT evaluation    Lower Extremity Assessment Lower Extremity Assessment: LLE deficits/detail LLE Deficits / Details: hip flex 2-/5, knee ext 2+/5 LLE Sensation: history of peripheral neuropathy;decreased proprioception LLE Coordination: decreased gross motor    Cervical / Trunk Assessment Cervical / Trunk Assessment: Normal  Communication   Communication Communication: No apparent difficulties    Cognition Arousal: Alert Behavior During Therapy: Impulsive   PT - Cognitive impairments: Safety/Judgement, Memory                       PT - Cognition Comments: in tact for basic conversation but has had some head injuries including the incident in jail when he lost his eye Following commands: Intact       Cueing Cueing Techniques: Verbal cues     General Comments General comments (skin integrity, edema, etc.): VSS. Pt needs cues to breathe, holds breath with mvmt. Daughter present and very helpful.    Exercises Total Joint Exercises Ankle Circles/Pumps: AROM, Both, 10 reps, Seated (handout given) Quad Sets: AROM, Both, 10 reps, Seated   Assessment/Plan    PT Assessment Patient needs continued PT services  PT Problem List Decreased balance;Decreased mobility;Decreased range of motion;Decreased coordination;Decreased activity tolerance;Decreased  strength;Impaired sensation;Decreased knowledge of precautions;Decreased safety awareness;Decreased knowledge of use of DME;Pain       PT Treatment Interventions DME instruction;Gait training;Functional mobility training;Therapeutic activities;Therapeutic exercise;Stair training;Balance training;Neuromuscular re-education;Cognitive remediation;Patient/family education    PT Goals (Current goals can be found in the Care Plan section)  Acute Rehab PT Goals Patient Stated Goal: get independent and then return home PT Goal Formulation: With patient Time For Goal Achievement: 12/21/23 Potential to Achieve Goals: Good    Frequency Min 2X/week     Co-evaluation               AM-PAC PT 6 Clicks Mobility  Outcome Measure Help needed turning from your back to your side while in a flat bed without using bedrails?: A Little Help needed moving from lying on your back to sitting on the side of a flat bed without using bedrails?: A Little Help needed moving to and from a bed to a chair (including a wheelchair)?: A Lot Help needed standing up from a chair using your arms (e.g., wheelchair or bedside chair)?: A Lot Help needed to walk in hospital room?: Total Help needed climbing 3-5 steps with a railing? : Total 6 Click Score: 12    End of Session Equipment Utilized During  Treatment: Gait belt Activity Tolerance: Patient limited by pain Patient left: in chair;with call bell/phone within reach;with family/visitor present;with chair alarm set Nurse Communication: Mobility status PT Visit Diagnosis: Unsteadiness on feet (R26.81);History of falling (Z91.81);Difficulty in walking, not elsewhere classified (R26.2);Pain Pain - Right/Left: Left Pain - part of body: Hip    Time: 8973-8896 PT Time Calculation (min) (ACUTE ONLY): 37 min   Charges:   PT Evaluation $PT Eval Moderate Complexity: 1 Mod PT Treatments $Therapeutic Activity: 8-22 mins PT General Charges $$ ACUTE PT VISIT: 1  Visit         [name], PT  Acute Rehab Services Secure chat preferred Office [phone]   [place] L Marina Desire 12/07/2023, 1:19 PM

## 2023-12-08 DIAGNOSIS — Z96642 Presence of left artificial hip joint: Secondary | ICD-10-CM

## 2023-12-08 DIAGNOSIS — S72002A Fracture of unspecified part of neck of left femur, initial encounter for closed fracture: Secondary | ICD-10-CM | POA: Diagnosis not present

## 2023-12-08 LAB — BASIC METABOLIC PANEL WITH GFR
Anion gap: 7 (ref 5–15)
BUN: 39 mg/dL — ABNORMAL HIGH (ref 6–20)
CO2: 23 mmol/L (ref 22–32)
Calcium: 8 mg/dL — ABNORMAL LOW (ref 8.9–10.3)
Chloride: 103 mmol/L (ref 98–111)
Creatinine, Ser: 2.19 mg/dL — ABNORMAL HIGH (ref 0.61–1.24)
GFR, Estimated: 34 mL/min — ABNORMAL LOW (ref >60)
Glucose, Bld: 248 mg/dL — ABNORMAL HIGH (ref 70–99)
Potassium: 4.3 mmol/L (ref 3.5–5.1)
Sodium: 133 mmol/L — ABNORMAL LOW (ref 135–145)

## 2023-12-08 LAB — GLUCOSE, CAPILLARY
Glucose-Capillary: 219 mg/dL — ABNORMAL HIGH (ref 70–99)
Glucose-Capillary: 237 mg/dL — ABNORMAL HIGH (ref 70–99)
Glucose-Capillary: 257 mg/dL — ABNORMAL HIGH (ref 70–99)
Glucose-Capillary: 403 mg/dL — ABNORMAL HIGH (ref 70–99)

## 2023-12-08 MED ORDER — POLYETHYLENE GLYCOL 3350 17 G PO PACK: 17.0000 g | PACK | Freq: Two times a day (BID) | ORAL | Status: AC

## 2023-12-08 MED ORDER — INSULIN ASPART 100 UNIT/ML IJ SOLN: 5.0000 [IU] | Freq: Three times a day (TID) | INTRAMUSCULAR | Status: DC

## 2023-12-08 MED ORDER — INSULIN GLARGINE-YFGN 100 UNIT/ML ~~LOC~~ SOLN
12.0000 [IU] | Freq: Every day | SUBCUTANEOUS | Status: DC
  Filled 2023-12-08: qty 0.12

## 2023-12-08 MED ORDER — HYDRALAZINE HCL 10 MG PO TABS: 10.0000 mg | ORAL_TABLET | Freq: Three times a day (TID) | ORAL | Status: DC

## 2023-12-08 MED ORDER — VITAMIN D3 25 MCG PO TABS: 2000.0000 [IU] | ORAL_TABLET | Freq: Every day | ORAL | Status: DC

## 2023-12-08 MED ORDER — ADULT MULTIVITAMIN W/MINERALS CH
1.0000 | ORAL_TABLET | Freq: Every day | ORAL | Status: AC
Start: 2023-12-09 — End: ?

## 2023-12-08 MED ORDER — OXYCODONE HCL 5 MG PO TABS: 5.0000 mg | ORAL_TABLET | Freq: Three times a day (TID) | ORAL | 0 refills | Status: DC | PRN

## 2023-12-08 MED ORDER — METHOCARBAMOL 500 MG PO TABS: 500.0000 mg | ORAL_TABLET | Freq: Four times a day (QID) | ORAL | Status: DC | PRN

## 2023-12-08 MED ORDER — INSULIN ASPART 100 UNIT/ML IJ SOLN: 0.0000 [IU] | Freq: Three times a day (TID) | INTRAMUSCULAR | Status: DC

## 2023-12-08 MED ORDER — INSULIN GLARGINE-YFGN 100 UNIT/ML ~~LOC~~ SOLN: 12.0000 [IU] | Freq: Every day | SUBCUTANEOUS | Status: DC

## 2023-12-08 NOTE — TOC Progression Note (Addendum)
 Transition of Care [facility]) - Progression Note    Patient Details  Name: Douglas Carr MRN: [id number] Date of Birth: 01-28-1966  [facility] CM/SW Contact  Lendia Dais, [place] Phone Number: 12/08/2023, 10:14 AM  Clinical Narrative: Pt PASSR is  [phone] [id number].    1357 - CSW presented bed offers to patient and daughter [name]. The pt and the daughter accepted a bed offer at [facility].  CSW reached out to [facility]: they do have available bed today.  MD informed.   Medicare payer with inpt order on 7/31.  CSW updated pt and daughter.  Daughter going to [place] at 3pm to sign paperwork.  Transportation discussed and daughter is able to transport pt.     Expected Discharge Plan: Skilled Nursing Facility Barriers to Discharge: Continued Medical Work up               Expected Discharge Plan and Services In-house Referral: Clinical Social Work     Living arrangements for the past 2 months: Single Family Home                                       Social Drivers of Health (SDOH) Interventions SDOH Screenings   Food Insecurity: No Food Insecurity (12/04/2023)  Housing: Low Risk  (12/04/2023)  Transportation Needs: No Transportation Needs (12/04/2023)  Utilities: Not At Risk (12/04/2023)  Alcohol Screen: Low Risk  (07/24/2021)  Depression (PHQ2-9): Low Risk  (12/03/2023)  Financial Resource Strain: Low Risk  (07/24/2021)  Physical Activity: Sufficiently Active (07/24/2021)  Social Connections: Moderately Integrated (07/24/2021)  Stress: No Stress Concern Present (07/24/2021)  Tobacco Use: High Risk (12/06/2023)    Readmission Risk Interventions     No data to display

## 2023-12-08 NOTE — Inpatient Diabetes Management (Signed)
 Inpatient Diabetes Program Recommendations  AACE/ADA: New Consensus Statement on Inpatient Glycemic Control   Target Ranges:  Prepandial:   less than 140 mg/dL      Peak postprandial:   less than 180 mg/dL (1-2 hours)      Critically ill patients:  140 - 180 mg/dL    Latest Reference Range & Units 12/04/23 01:22 12/04/23 04:32 12/04/23 08:43  Glucose-Capillary 70 - 99 mg/dL 633 (H) 766 (H) 755 (H)    Latest Reference Range & Units 12/03/23 13:02 12/03/23 16:15 12/04/23 02:48  Glucose 70 - 99 mg/dL 655 (H) 605 (H) 681 (H)   Review of Glycemic Control  Diabetes history: DM2 Outpatient Diabetes medications: Lantus  11 units at bedtime, Ozmepic 0.25 mg Qweek Current orders for Inpatient glycemic control:  Semglee  12 units at bedtime Novolog  015 units tid + hs Novolog  3 units tid meal coverage  Note : pt received Decadron  10 mg am of 8/4  A1c  13.4% on 8/2 Pt has been without medication for a month  Spoke with pt at bedside regarding A1c level and glucose control at home. Pt was recently released from prison and had issues with reinstating insurance and was able to recently go to his PCP and reinstate his prescriptions. He was, however, without his medications for 1 month prior to admission. PCP visit was on 7/31. Pt has all supplies at pharmacy and daughter has plans to pick it up prior to pt getting home. Pt to follow up in October with PCP for A1c recheck.  Thanks, Clotilda Bull RN, MSN, BC-ADM Inpatient Diabetes Coordinator Team Pager [phone] (8a-5p)

## 2023-12-08 NOTE — Progress Notes (Signed)
 DAILY PROGRESS NOTE   Patient Name: Douglas Carr Date of Encounter: 12/08/2023 Cardiologist: Dorn Lesches, MD  Chief Complaint   No chest pain  Patient Profile   Douglas Carr is a 58 y.o. male with a hx of ASCAD with lateral wall STEMI in 2018 complicated by V-fib arrest status post PCI of the circumflex/OM,  anxiety dilated cardiomyopathy with EF 30 to 35% with chronic combined systolic/diastolic CHF, diabetes mellitus, hyperlipidemia, hypertension, PAD status post left carotid artery endarterectomy who is being seen today for the evaluation of preoperative cardiac clearance at the request of Deliliah Flavors, MD.   Subjective   Underwent successful left total hip surgery 2 days ago - seems to be doing well. Echo repeated yesterday - shows stable cardiomyopathy with LVEF 30-35% (global hypokinesis) and grade 1 DD. No significant valve disease, RA pressure estimated at 8 mmHg, RVSP 49 mmHg.  Objective   Vitals:   12/07/23 2121 12/08/23 0504 12/08/23 0735 12/08/23 0842  BP: (!) 126/58 (!) 147/59 (!) 115/53 (!) 115/53  Pulse: 68 81 71 71  Resp:  18 17   Temp:  98.9 F (37.2 C) 98.3 F (36.8 C)   TempSrc:   Oral   SpO2:  96% 98%   Weight:      Height:        Intake/Output Summary (Last 24 hours) at 12/08/2023 9096 Last data filed at 12/08/2023 0500 Gross per 24 hour  Intake 240 ml  Output 1340 ml  Net -1100 ml   Filed Weights   12/03/23 1514 12/06/23 0712 12/07/23 0500  Weight: 91.6 kg 91.6 kg 91.5 kg    Physical Exam   General appearance: alert and no distress Lungs: clear to auscultation bilaterally Heart: regular rate and rhythm, S1, S2 normal, no murmur, click, rub or gallop Extremities: extremities normal, atraumatic, no cyanosis or edema Neurologic: Grossly normal  Inpatient Medications    Scheduled Meds:  acetaminophen   1,000 mg Oral Q6H   aspirin  EC  81 mg Oral Daily   carvedilol   3.125 mg Oral Q12H   Chlorhexidine  Gluconate Cloth  6 each  Topical Daily   cholecalciferol   2,000 Units Oral Daily   clopidogrel   75 mg Oral QPM   hydrALAZINE   10 mg Oral Q8H   insulin  aspart  0-15 Units Subcutaneous TID WC   insulin  aspart  0-5 Units Subcutaneous QHS   insulin  aspart  3 Units Subcutaneous TID WC   insulin  glargine-yfgn  10 Units Subcutaneous QHS   isosorbide  dinitrate  5 mg Oral BID   mirtazapine   7.5 mg Oral QHS   multivitamin with minerals  1 tablet Oral Daily   polyethylene glycol  17 g Oral BID   rosuvastatin   40 mg Oral Daily   senna  2 tablet Oral QHS   tamsulosin   0.4 mg Oral Daily    Continuous Infusions:  sodium chloride  75 mL/hr at 12/06/23 1756    PRN Meds: alum & mag hydroxide-simeth, bisacodyl , diphenhydrAMINE , HYDROmorphone  (DILAUDID ) injection, menthol -cetylpyridinium **OR** phenol, methocarbamol  **OR** methocarbamol  (ROBAXIN ) injection, metoCLOPramide  **OR** metoCLOPramide  (REGLAN ) injection, oxyCODONE , oxyCODONE , trimethobenzamide    Labs   Results for orders placed or performed during the hospital encounter of 12/03/23 (from the past 48 hours)  Glucose, capillary     Status: Abnormal   Collection Time: 12/06/23  9:33 AM  Result Value Ref Range   Glucose-Capillary 243 (H) 70 - 99 mg/dL    Comment: Glucose reference range applies only to samples taken after fasting for  at least 8 hours.  Glucose, capillary     Status: Abnormal   Collection Time: 12/06/23 12:33 PM  Result Value Ref Range   Glucose-Capillary 219 (H) 70 - 99 mg/dL    Comment: Glucose reference range applies only to samples taken after fasting for at least 8 hours.  Glucose, capillary     Status: Abnormal   Collection Time: 12/06/23  6:44 PM  Result Value Ref Range   Glucose-Capillary 231 (H) 70 - 99 mg/dL    Comment: Glucose reference range applies only to samples taken after fasting for at least 8 hours.  Glucose, capillary     Status: Abnormal   Collection Time: 12/06/23  8:10 PM  Result Value Ref Range   Glucose-Capillary 225 (H)  70 - 99 mg/dL    Comment: Glucose reference range applies only to samples taken after fasting for at least 8 hours.  Glucose, capillary     Status: Abnormal   Collection Time: 12/07/23 12:14 AM  Result Value Ref Range   Glucose-Capillary 301 (H) 70 - 99 mg/dL    Comment: Glucose reference range applies only to samples taken after fasting for at least 8 hours.  Glucose, capillary     Status: Abnormal   Collection Time: 12/07/23  5:06 AM  Result Value Ref Range   Glucose-Capillary 194 (H) 70 - 99 mg/dL    Comment: Glucose reference range applies only to samples taken after fasting for at least 8 hours.  Basic metabolic panel     Status: Abnormal   Collection Time: 12/07/23  6:54 AM  Result Value Ref Range   Sodium 137 135 - 145 mmol/L   Potassium 4.9 3.5 - 5.1 mmol/L   Chloride 107 98 - 111 mmol/L   CO2 21 (L) 22 - 32 mmol/L   Glucose, Bld 210 (H) 70 - 99 mg/dL    Comment: Glucose reference range applies only to samples taken after fasting for at least 8 hours.   BUN 26 (H) 6 - 20 mg/dL   Creatinine, Ser 7.94 (H) 0.61 - 1.24 mg/dL   Calcium  8.0 (L) 8.9 - 10.3 mg/dL   GFR, Estimated 37 (L) >60 mL/min    Comment: (NOTE) Calculated using the CKD-EPI Creatinine Equation (2021)    Anion gap 9 5 - 15    Comment: Performed at [facility], [address] [place], [place] [address]  CBC with Differential/Platelet     Status: Abnormal   Collection Time: 12/07/23  6:54 AM  Result Value Ref Range   WBC 12.6 (H) 4.0 - 10.5 K/uL   RBC 3.68 (L) 4.22 - 5.81 MIL/uL   Hemoglobin 9.7 (L) 13.0 - 17.0 g/dL   HCT 69.0 (L) 60.9 - 47.9 %   MCV 84.0 80.0 - 100.0 fL   MCH 26.4 26.0 - 34.0 pg   MCHC 31.4 30.0 - 36.0 g/dL   RDW 86.5 88.4 - 84.4 %   Platelets 151 150 - 400 K/uL    Comment: REPEATED TO VERIFY   nRBC 0.0 0.0 - 0.2 %   Neutrophils Relative % 73 %   Neutro Abs 9.3 (H) 1.7 - 7.7 K/uL   Lymphocytes Relative 12 %   Lymphs Abs 1.5 0.7 - 4.0 K/uL   Monocytes Relative 13 %    Monocytes Absolute 1.6 (H) 0.1 - 1.0 K/uL   Eosinophils Relative 1 %   Eosinophils Absolute 0.1 0.0 - 0.5 K/uL   Basophils Relative 0 %   Basophils Absolute  0.0 0.0 - 0.1 K/uL   Immature Granulocytes 1 %   Abs Immature Granulocytes 0.06 0.00 - 0.07 K/uL   Abs Granulocyte 9.3 (H) 1.5 - 6.5 K/uL    Comment: Performed at [facility], [address], [place], [place] [address]  Glucose, capillary     Status: Abnormal   Collection Time: 12/07/23  8:23 AM  Result Value Ref Range   Glucose-Capillary 265 (H) 70 - 99 mg/dL    Comment: Glucose reference range applies only to samples taken after fasting for at least 8 hours.  Glucose, capillary     Status: Abnormal   Collection Time: 12/07/23 12:03 PM  Result Value Ref Range   Glucose-Capillary 323 (H) 70 - 99 mg/dL    Comment: Glucose reference range applies only to samples taken after fasting for at least 8 hours.  Glucose, capillary     Status: Abnormal   Collection Time: 12/07/23  4:28 PM  Result Value Ref Range   Glucose-Capillary 411 (H) 70 - 99 mg/dL    Comment: Glucose reference range applies only to samples taken after fasting for at least 8 hours.  Glucose, capillary     Status: Abnormal   Collection Time: 12/07/23  8:01 PM  Result Value Ref Range   Glucose-Capillary 351 (H) 70 - 99 mg/dL    Comment: Glucose reference range applies only to samples taken after fasting for at least 8 hours.  Glucose, capillary     Status: Abnormal   Collection Time: 12/08/23 12:09 AM  Result Value Ref Range   Glucose-Capillary 257 (H) 70 - 99 mg/dL    Comment: Glucose reference range applies only to samples taken after fasting for at least 8 hours.  Glucose, capillary     Status: Abnormal   Collection Time: 12/08/23  5:03 AM  Result Value Ref Range   Glucose-Capillary 237 (H) 70 - 99 mg/dL    Comment: Glucose reference range applies only to samples taken after fasting for at least 8 hours.  Basic metabolic panel     Status: Abnormal    Collection Time: 12/08/23  6:19 AM  Result Value Ref Range   Sodium 133 (L) 135 - 145 mmol/L   Potassium 4.3 3.5 - 5.1 mmol/L   Chloride 103 98 - 111 mmol/L   CO2 23 22 - 32 mmol/L   Glucose, Bld 248 (H) 70 - 99 mg/dL    Comment: Glucose reference range applies only to samples taken after fasting for at least 8 hours.   BUN 39 (H) 6 - 20 mg/dL   Creatinine, Ser 7.80 (H) 0.61 - 1.24 mg/dL   Calcium  8.0 (L) 8.9 - 10.3 mg/dL   GFR, Estimated 34 (L) >60 mL/min    Comment: (NOTE) Calculated using the CKD-EPI Creatinine Equation (2021)    Anion gap 7 5 - 15    Comment: Performed at [facility], [address] [place], [place] [address]  Glucose, capillary     Status: Abnormal   Collection Time: 12/08/23  8:10 AM  Result Value Ref Range   Glucose-Capillary 219 (H) 70 - 99 mg/dL    Comment: Glucose reference range applies only to samples taken after fasting for at least 8 hours.    ECG   N/A  Telemetry   N/A  Radiology    ECHOCARDIOGRAM COMPLETE Result Date: 12/07/2023    ECHOCARDIOGRAM REPORT   Patient Name:   Douglas Carr Date of Exam: 12/07/2023 Medical Rec #:  [id number]           Height:       70.0 in Accession #:    [id number]          Weight:       201.7 lb Date of Birth:  21-Jan-1966           BSA:          2.095 m Patient Age:    58 years            BP:           135/63 mmHg Patient Gender: M                   HR:           78 bpm. Exam Location:  Inpatient Procedure: 2D Echo, Cardiac Doppler and Color Doppler (Both Spectral and Color            Flow Doppler were utilized during procedure). Indications:    Cardiomyopathy- ischemic I25.5  History:        Patient has prior history of Echocardiogram examinations, most                 recent 05/27/2021. Cardiomyopathy, CAD, Prior CABG and                 Defibrillator; Risk Factors:Diabetes and Hypertension.  Sonographer:    Meagan Baucom RDCS, FE, PE Referring Phys: [id number] TRACI R TURNER IMPRESSIONS  1. Left ventricular  ejection fraction, by estimation, is 30 to 35%. The left ventricle has moderately decreased function. The left ventricle demonstrates global hypokinesis. The left ventricular internal cavity size was mildly dilated. Left ventricular diastolic parameters are consistent with Grade I diastolic dysfunction (impaired relaxation).  2. Right ventricular systolic function is normal. The right ventricular size is normal. There is moderately elevated pulmonary artery systolic pressure. The estimated right ventricular systolic pressure is 49.0 mmHg.  3. The mitral valve is normal in structure. Mild mitral valve regurgitation. No evidence of mitral stenosis.  4. The aortic valve is tricuspid. There is mild thickening of the aortic valve. Aortic valve regurgitation is not visualized. Aortic valve sclerosis is present, with no evidence of aortic valve stenosis.  5. The inferior vena cava is normal in size with <50% respiratory variability, suggesting right atrial pressure of 8 mmHg. FINDINGS  Left Ventricle: Left ventricular ejection fraction, by estimation, is 30 to 35%. The left ventricle has moderately decreased function. The left ventricle demonstrates global hypokinesis. The left ventricular internal cavity size was mildly dilated. There is no left ventricular hypertrophy. Left ventricular diastolic parameters are consistent with Grade I diastolic dysfunction (impaired relaxation). Right Ventricle: The right ventricular size is normal. No increase in right ventricular wall thickness. Right ventricular systolic function is normal. There is moderately elevated pulmonary artery systolic pressure. The tricuspid regurgitant velocity is 3.20 m/s, and with an assumed right atrial pressure of 8 mmHg, the estimated right ventricular systolic pressure is 49.0 mmHg. Left Atrium: Left atrial size was normal in size. Right Atrium: Right atrial size was normal in size. Pericardium: There is no evidence of pericardial effusion. Mitral  Valve: The mitral valve is normal in structure. Mild mitral valve regurgitation. No evidence of mitral valve stenosis. Tricuspid Valve: The tricuspid valve is normal in structure. Tricuspid valve regurgitation is mild . No evidence of tricuspid stenosis. Aortic Valve: The aortic valve is tricuspid. There is mild thickening of the aortic valve. Aortic valve regurgitation is  not visualized. Aortic valve sclerosis is present, with no evidence of aortic valve stenosis. Pulmonic Valve: The pulmonic valve was normal in structure. Pulmonic valve regurgitation is not visualized. No evidence of pulmonic stenosis. Aorta: The aortic root is normal in size and structure. Venous: The inferior vena cava is normal in size with less than 50% respiratory variability, suggesting right atrial pressure of 8 mmHg. IAS/Shunts: No atrial level shunt detected by color flow Doppler.  LEFT VENTRICLE PLAX 2D LVIDd:         5.87 cm   Diastology LVIDs:         4.76 cm   LV e' medial:    5.11 cm/s LV PW:         1.15 cm   LV E/e' medial:  20.9 LV IVS:        1.18 cm   LV e' lateral:   10.90 cm/s LVOT diam:     2.29 cm   LV E/e' lateral: 9.8 LV SV:         63 LV SV Index:   30 LVOT Area:     4.12 cm  RIGHT VENTRICLE RV S prime:     10.00 cm/s TAPSE (M-mode): 1.5 cm LEFT ATRIUM             Index        RIGHT ATRIUM           Index LA diam:        4.38 cm 2.09 cm/m   RA Area:     15.60 cm LA Vol (A2C):   64.7 ml 30.88 ml/m  RA Volume:   38.50 ml  18.38 ml/m LA Vol (A4C):   66.3 ml 31.65 ml/m LA Biplane Vol: 65.9 ml 31.46 ml/m  AORTIC VALVE LVOT Vmax:   83.30 cm/s LVOT Vmean:  55.700 cm/s LVOT VTI:    0.153 m  AORTA Ao Root diam: 3.24 cm MITRAL VALVE                TRICUSPID VALVE MV Area (PHT): 5.02 cm     TR Peak grad:   41.0 mmHg MV Decel Time: 151 msec     TR Vmax:        320.00 cm/s MV E velocity: 107.00 cm/s MV A velocity: 55.10 cm/s   SHUNTS MV E/A ratio:  1.94         Systemic VTI:  0.15 m                             Systemic Diam:  2.29 cm Morene Brownie Electronically signed by Morene Brownie Signature Date/Time: 12/07/2023/5:02:48 PM    Final    VAS US  CAROTID Result Date: 12/07/2023 Carotid Arterial Duplex Study Patient Name:  Douglas Carr [facility]  Date of Exam:   12/07/2023 Medical Rec #: [id number]            Accession #:    [id number] Date of Birth: 1965/05/15            Patient Gender: M Patient Age:   28 years Exam Location:  [facility] Procedure:      VAS US  CAROTID Referring Phys: WILBERT TURNER --------------------------------------------------------------------------------  Indications:       Carotid artery disease. Risk Factors:      Hypertension, hyperlipidemia, Diabetes, past history of                    smoking,  prior MI, coronary artery disease, PAD. Other Factors:     CKD, CHF, S/P CABG x5, IVD, RT CEA 2010. Comparison Study:  Previous exam on 05/27/2021 - Occluded right carotid system,                    LT ICA 60-79% Performing Technologist: Jody Hill RVT, RDMS  Examination Guidelines: A complete evaluation includes B-mode imaging, spectral Doppler, color Doppler, and power Doppler as needed of all accessible portions of each vessel. Bilateral testing is considered an integral part of a complete examination. Limited examinations for reoccurring indications may be performed as noted.  Right Carotid Findings: +----------+--------+--------+--------+------------------+--------+           PSV cm/sEDV cm/sStenosisPlaque DescriptionComments +----------+--------+--------+--------+------------------+--------+ CCA Prox                  Occluded                           +----------+--------+--------+--------+------------------+--------+ CCA Distal                Occluded                           +----------+--------+--------+--------+------------------+--------+ ICA Prox                  Occluded                           +----------+--------+--------+--------+------------------+--------+ ICA Distal                 Occluded                           +----------+--------+--------+--------+------------------+--------+ ECA                       Occluded                           +----------+--------+--------+--------+------------------+--------+ +----------+--------+-------+----------------+-------------------+           PSV cm/sEDV cmsDescribe        Arm Pressure (mmHG) +----------+--------+-------+----------------+-------------------+ Subclavian182            Multiphasic, WNL                    +----------+--------+-------+----------------+-------------------+ +---------+--------+--------+----------------+ VertebralPSV cm/sEDV cm/sappears occluded +---------+--------+--------+----------------+  Left Carotid Findings: +----------+-------+--------+--------+-----------------------+-----------------+           PSV    EDV cm/sStenosisPlaque Description     Comments                    cm/s                                                            +----------+-------+--------+--------+-----------------------+-----------------+ CCA Prox  101    19              calcific and  heterogenous                             +----------+-------+--------+--------+-----------------------+-----------------+ CCA Distal108    25              heterogenous and       intimal                                            calcific               thickening        +----------+-------+--------+--------+-----------------------+-----------------+ ICA Prox  257    89      60-79%  calcific and irregular                   +----------+-------+--------+--------+-----------------------+-----------------+ ICA Mid   216    82      60-79%  calcific and irregular                   +----------+-------+--------+--------+-----------------------+-----------------+ ICA Distal138    61                                                        +----------+-------+--------+--------+-----------------------+-----------------+ ECA       361    29      >50%    calcific and irregular                   +----------+-------+--------+--------+-----------------------+-----------------+ +----------+--------+--------+-----------------------------+-------------------+           PSV cm/sEDV cm/sDescribe                     Arm Pressure (mmHG) +----------+--------+--------+-----------------------------+-------------------+ [id number]             elevated velocities without                                                evidence of stenosis                             +----------+--------+--------+-----------------------------+-------------------+ +---------+--------+--+--------+-+------------------------+ VertebralPSV cm/s66EDV cm/s9Retrograde and turbulent +---------+--------+--+--------+-+------------------------+   Summary: Right Carotid: Evidence consistent with a total occlusion of the right ICA. The                CCA appears occluded. The ECA appears occluded. Left Carotid: Velocities in the left ICA are consistent with a 60-79% stenosis.               Non-hemodynamically significant plaque <50% noted in the CCA. The               ECA appears >50% stenosed. Vertebrals:  Left vertebral artery demonstrates retrograde/turbulent flow. Right              vertebral artery demonstrates an occlusion. Subclavians: Normal flow hemodynamics were seen in bilateral subclavian              arteries. *See table(s) above for measurements and observations.  Electronically signed by Norman Serve  on 12/07/2023 at 2:04:34 PM.    Final    DG Pelvis Portable Result Date: 12/06/2023 CLINICAL DATA:  Status post total hip arthroplasty [id number] EXAM: PORTABLE PELVIS 1-2 VIEWS COMPARISON:  12/03/2023 FINDINGS: Single view of the pelvis demonstrates a left total hip arthroplasty. Left hip appears located on this single view. The  entire left femoral stem is visualized. No acute abnormality to the right hip. IMPRESSION: Status post left total hip arthroplasty without complicating features. Electronically Signed   By: Juliene Balder M.D.   On: 12/06/2023 12:41   DG C-Arm 1-60 Min-No Report Result Date: 12/06/2023 Fluoroscopy was utilized by the requesting physician.  No radiographic interpretation.   DG C-Arm 1-60 Min-No Report Result Date: 12/06/2023 Fluoroscopy was utilized by the requesting physician.  No radiographic interpretation.    Cardiac Studies   See echo above  Assessment   Principal Problem:   Closed left hip fracture, initial encounter [facility]) Active Problems:   Coronary artery disease involving native coronary artery of native heart with angina pectoris (HCC)   Type 2 diabetes mellitus with obesity (HCC)   Acute renal failure superimposed on stage 3a chronic kidney disease (HCC)   Chronic HFrEF (heart failure with reduced ejection fraction) (HCC)   Prolonged QT interval   Malnutrition of moderate degree   S/P total left hip arthroplasty   Plan   Doing well post-op. Repeat echo shows LVEF 30-35% - this is unchanged from prior echo. Had been non-compliant with meds prior to admission due to cost issues. Now restarted on GDMT which is allowable with CKD3b. Now on ASA, Plavix , carvedilol , hydralazine , and isordil .  Switched to rosuvastatin . Plans for outpatient follow-up and med titration. Will arrange follow-up with Dr. Court or APP.  [facility] will sign off.   Medication Recommendations:  continue current meds Other recommendations (labs, testing, etc):   none Follow up as an outpatient:  Dr. Court or APP   Time Spent Directly with Patient:  I have spent a total of 25 minutes with the patient reviewing hospital notes, telemetry, EKGs, labs and examining the patient as well as establishing an assessment and plan that was discussed personally with the patient.  > 50% of time was spent in  direct patient care.  Length of Stay:  LOS: 5 days   Vinie KYM Maxcy, MD, [facility], FNLA, FACP  [facility]  Medical Director of the [facility] Lipid Disorders &  Cardiovascular Risk Reduction Clinic Diplomate of the American Board of Clinical Lipidology Attending Cardiologist  Direct Dial: [phone]  Fax: [phone]  Website:  [email or web address] Vinie BROCKS Naiah Donahoe 12/08/2023, 9:03 AM

## 2023-12-08 NOTE — Plan of Care (Signed)
  Problem: Education: Goal: Knowledge of General Education information will improve Description: Including pain rating scale, medication(s)/side effects and non-pharmacologic comfort measures Outcome: Progressing   Problem: Health Behavior/Discharge Planning: Goal: Ability to manage health-related needs will improve Outcome: Progressing   Problem: Clinical Measurements: Goal: Ability to maintain clinical measurements within normal limits will improve Outcome: Progressing Goal: Will remain free from infection Outcome: Progressing Goal: Diagnostic test results will improve Outcome: Progressing Goal: Respiratory complications will improve Outcome: Progressing Goal: Cardiovascular complication will be avoided Outcome: Progressing   Problem: Activity: Goal: Risk for activity intolerance will decrease Outcome: Progressing   Problem: Nutrition: Goal: Adequate nutrition will be maintained Outcome: Progressing   Problem: Coping: Goal: Level of anxiety will decrease Outcome: Progressing   Problem: Elimination: Goal: Will not experience complications related to bowel motility Outcome: Progressing Goal: Will not experience complications related to urinary retention Outcome: Progressing   Problem: Pain Managment: Goal: General experience of comfort will improve and/or be controlled Outcome: Progressing   Problem: Safety: Goal: Ability to remain free from injury will improve Outcome: Progressing   Problem: Skin Integrity: Goal: Risk for impaired skin integrity will decrease Outcome: Progressing   Problem: Education: Goal: Ability to describe self-care measures that may prevent or decrease complications (Diabetes Survival Skills Education) will improve Outcome: Progressing Goal: Individualized Educational Video(s) Outcome: Progressing   Problem: Coping: Goal: Ability to adjust to condition or change in health will improve Outcome: Progressing   Problem: Fluid  Volume: Goal: Ability to maintain a balanced intake and output will improve Outcome: Progressing   Problem: Health Behavior/Discharge Planning: Goal: Ability to identify and utilize available resources and services will improve Outcome: Progressing Goal: Ability to manage health-related needs will improve Outcome: Progressing   Problem: Metabolic: Goal: Ability to maintain appropriate glucose levels will improve Outcome: Progressing   Problem: Nutritional: Goal: Maintenance of adequate nutrition will improve Outcome: Progressing Goal: Progress toward achieving an optimal weight will improve Outcome: Progressing   Problem: Skin Integrity: Goal: Risk for impaired skin integrity will decrease Outcome: Progressing   Problem: Tissue Perfusion: Goal: Adequacy of tissue perfusion will improve Outcome: Progressing   Problem: Education: Goal: Knowledge of the prescribed therapeutic regimen will improve Outcome: Progressing Goal: Understanding of discharge needs will improve Outcome: Progressing Goal: Individualized Educational Video(s) Outcome: Progressing   Problem: Activity: Goal: Ability to avoid complications of mobility impairment will improve Outcome: Progressing Goal: Ability to tolerate increased activity will improve Outcome: Progressing   Problem: Clinical Measurements: Goal: Postoperative complications will be avoided or minimized Outcome: Progressing   Problem: Pain Management: Goal: Pain level will decrease with appropriate interventions Outcome: Progressing   Problem: Skin Integrity: Goal: Will show signs of wound healing Outcome: Progressing

## 2023-12-08 NOTE — Progress Notes (Signed)
 Mobility Specialist Progress Note:    12/08/23 1500  Mobility  Activity Ambulated with assistance;Pivoted/transferred from bed to chair  Level of Assistance Minimal assist, patient does 75% or more  Assistive Device Front wheel walker  Distance Ambulated (ft) 12 ft  LLE Weight Bearing Per Provider Order WBAT  Activity Response Tolerated well  Mobility Referral Yes  Mobility visit 1 Mobility  Mobility Specialist Start Time (ACUTE ONLY) 1123  Mobility Specialist Stop Time (ACUTE ONLY) 1136  Mobility Specialist Time Calculation (min) (ACUTE ONLY) 13 min   Pt received in bed agreeable to mobility. No physical assistance needed for bed mobility , MinA needed for STS. Pt was able to ambulate around the room w/o fault. Left in the chair w/ call bell and personal belongings in reach. All needs met. Chair alarm on.  Thersia Minder Mobility Specialist  Please contact vis Secure Chat or  Rehab Office [phone]

## 2023-12-08 NOTE — Progress Notes (Signed)
 Attempted to contact [facility] to give report. No answer when transferred.

## 2023-12-08 NOTE — TOC Transition Note (Signed)
 Transition of Care [facility]) - Discharge Note   Patient Details  Name: Douglas Carr MRN: [id number] Date of Birth: 11/26/1965  [facility] CM/SW Contact:  Bridget Cordella Simmonds, LCSW Phone Number: 12/08/2023, 4:08 PM   Clinical Narrative:   Pt discharging to [facility], room 112.  RN call report to [phone].   Pt daughter will transport pt to SNF and will need pt brought down to main tower entrance with assistance getting into the vehicle.     Final next level of care: Skilled Nursing Facility Barriers to Discharge: Barriers Resolved   Patient Goals and CMS Choice Patient states their goals for this hospitalization and ongoing recovery are:: No more falls CMS Medicare.gov Compare Post Acute Care list provided to:: Patient Choice offered to / list presented to : Patient      Discharge Placement              Patient chooses bed at: [facility] Patient to be transferred to facility by: daughter [name] Name of family member notified: daughter [name] Patient and family notified of of transfer: 12/08/23  Discharge Plan and Services Additional resources added to the After Visit Summary for   In-house Referral: Clinical Social Work                                   Social Drivers of Health (SDOH) Interventions SDOH Screenings   Food Insecurity: No Food Insecurity (12/04/2023)  Housing: Low Risk  (12/04/2023)  Transportation Needs: No Transportation Needs (12/04/2023)  Utilities: Not At Risk (12/04/2023)  Alcohol Screen: Low Risk  (07/24/2021)  Depression (PHQ2-9): Low Risk  (12/03/2023)  Financial Resource Strain: Low Risk  (07/24/2021)  Physical Activity: Sufficiently Active (07/24/2021)  Social Connections: Moderately Integrated (07/24/2021)  Stress: No Stress Concern Present (07/24/2021)  Tobacco Use: High Risk (12/06/2023)     Readmission Risk Interventions     No data to display

## 2023-12-08 NOTE — Discharge Summary (Signed)
 Physician Discharge Summary   Patient: Douglas Carr MRN: [id number] DOB: December 17, 1965  Admit date:     12/03/2023  Discharge date: 12/08/23  Discharge Physician: [name]   PCP: [name], [name], MD   Recommendations at discharge:    Follow up with your PCP in one week Follow up out patient with orthopedics. Call to make an appointment.  Continue taking meds as prescribed Carb controlled diet  Discharge Diagnoses: Principal Problem:   Closed left hip fracture, initial encounter [facility]) Active Problems:   Coronary artery disease involving native coronary artery of native heart with angina pectoris (HCC)   Type 2 diabetes mellitus with obesity (HCC)   Acute renal failure superimposed on stage 3a chronic kidney disease (HCC)   Chronic HFrEF (heart failure with reduced ejection fraction) (HCC)   Prolonged QT interval   Malnutrition of moderate degree   S/P total left hip arthroplasty   Hospital Course:  Left hip fracture secondary to mechanical fall: -s/p Left THA done on 8/3. POD#2 -PT OT eval  -Pain management -SNF placement on discharge     AKI superimposed on CKD 3A : Improving - f/u renal function closely -Avoid nephrotoxic meds - He did receive IVF -Creatinine is 2.19 today   CAD s/p CABG  - No recent angina  - continue ASA,Plavix ,beta-blocker, statin, and nitrates    Chronic HFrEF,POA: NYHA II  - Appears compensated  - Monitor weight and I/Os, continue Coreg ,hydralazine  and nitrates.     Carotid artery disease,POA:  US  Carotid doppler showed Evidence consistent with a total occlusion of the right  ICA. The CCA appears occluded. The ECA appears occluded.  Left Carotid: Velocities in the left ICA are consistent with a 60-79%  stenosis. Non-hemodynamically significant plaque <50% noted in the  CCA. The  ECA appears >50% stenosed.  Vertebrals:  Left vertebral artery demonstrates retrograde/turbulent flow.  Right vertebral artery demonstrates an  occlusion.  Subclavians: Normal flow hemodynamics were seen in bilateral subclavian arteries.  Continue with aspirin  and statin   Insulin -dependent type 2 DM, uncontrolled - A1c 13.1% - Continue with both long acting 12 units daily and SSI along with short acting insulin  5 units TID scheduled    Prolonged QT interval  - avoid QT-prolonging medications     Urinary retention with BPH - Out patient follow up with urology -Keep foley in place until then as patient was requiring frequent in and out catheterizations in the hospital. -continue tamsulosin     Disposition: [facility]      Consultants: Orthopedics Procedures performed: Left THA  Disposition: Skilled nursing facility Diet recommendation:  Cardiac and Carb modified diet DISCHARGE MEDICATION: Allergies as of 12/08/2023   No Known Allergies      Medication List     STOP taking these medications    amLODipine  10 MG tablet Commonly known as: NORVASC    Lantus  SoloStar 100 UNIT/ML Solostar Pen Generic drug: insulin  glargine       TAKE these medications    aspirin  EC 81 MG tablet Commonly known as: Aspirin  Low Dose Take 1 tablet (81 mg total) by mouth daily. Swallow whole.   atorvastatin  80 MG tablet Commonly known as: LIPITOR  Take 1 tablet (80 mg total) by mouth every evening.   carvedilol  3.125 MG tablet Commonly known as: COREG  Take 1 tablet (3.125 mg total) by mouth every 12 (twelve) hours.   clopidogrel  75 MG tablet Commonly known as: PLAVIX  Take 1 tablet (75 mg total) by mouth every evening.   hydrALAZINE   10 MG tablet Commonly known as: APRESOLINE  Take 1 tablet (10 mg total) by mouth every 8 (eight) hours.   ibuprofen 200 MG tablet Commonly known as: ADVIL Take 400 mg by mouth 2 (two) times daily as needed for headache or moderate pain (pain score 4-6).   insulin  aspart 100 UNIT/ML injection Commonly known as: novoLOG  Inject 5 Units into the skin 3 (three) times daily with meals.    insulin  aspart 100 UNIT/ML injection Commonly known as: novoLOG  Inject 0-15 Units into the skin 3 (three) times daily with meals.   insulin  glargine-yfgn 100 UNIT/ML injection Commonly known as: SEMGLEE  Inject 0.12 mLs (12 Units total) into the skin at bedtime.   isosorbide  dinitrate 5 MG tablet Commonly known as: ISORDIL  Take 1 tablet (5 mg total) by mouth 2 (two) times daily. Please contact the office to schedule appointment for additional refills. 1st Attempt.   METFORMIN  HCL PO Take 500 mg by mouth at bedtime.   methocarbamol  500 MG tablet Commonly known as: ROBAXIN  Take 1 tablet (500 mg total) by mouth every 6 (six) hours as needed for muscle spasms.   mirtazapine  30 MG tablet Commonly known as: REMERON  Take 30 mg by mouth at bedtime.   multivitamin with minerals Tabs tablet Take 1 tablet by mouth daily. Start taking on: December 09, 2023   oxyCODONE  5 MG immediate release tablet Commonly known as: Roxicodone  Take 1 tablet (5 mg total) by mouth every 8 (eight) hours as needed for severe pain (pain score 7-10).   polyethylene glycol 17 g packet Commonly known as: MIRALAX  / GLYCOLAX  Take 17 g by mouth 2 (two) times daily.   Semaglutide (0.25 or 0.5MG /DOS) 2 MG/3ML Sopn Inject 0.25 mg into the skin once a week.   tamsulosin  0.4 MG Caps capsule Commonly known as: FLOMAX  Take 1 capsule (0.4 mg total) by mouth daily.   vitamin D3 25 MCG tablet Commonly known as: CHOLECALCIFEROL  Take 2 tablets (2,000 Units total) by mouth daily. Start taking on: December 09, 2023        Contact information for follow-up providers     [name], MD. Schedule an appointment as soon as possible for a visit in 2 week(s).   Specialty: Orthopedic Surgery Contact information: [address] [place] [address] [phone]         [name], [name], MD. Schedule an appointment as soon as possible for a visit in 1 week(s).   Specialties: Family Medicine,  Cardiology Contact information: [address] [place] [address] [phone]              Contact information for after-discharge care     Destination     [facility] [place], [place] .   Service: Skilled Nursing Contact information: 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 Respiratory effort normal. Cardiovascular system: S1 & S2 heard, RRR. No JVD, murmurs, rubs, gallops or clicks. No pedal edema. Gastrointestinal system: Abdomen is nondistended, soft and nontender. No organomegaly or masses felt. Normal bowel sounds heard. Central nervous system: Alert and oriented. No focal neurological deficits. Extremities: s/p left hip surgery, dressing in place, no evidence of discharge  Skin: No rashes, lesions or ulcers Psychiatry: Judgement and insight appear normal. Mood & affect appropriate.  Condition at discharge: good  The results of significant diagnostics from this hospitalization (including imaging, microbiology, ancillary and laboratory) are listed below for reference.   Imaging Studies: ECHOCARDIOGRAM COMPLETE Result Date: 12/07/2023    ECHOCARDIOGRAM REPORT   Patient Name:   JEREMY DITULLIO Date of Exam: 12/07/2023 Medical Rec #:  [id number]           Height:       70.0 in Accession #:    [id number]          Weight:       201.7 lb Date of Birth:  Sep 14, 1965           BSA:          2.095 m Patient Age:    58 years            BP:           135/63 mmHg Patient Gender: M                   HR:           78 bpm. Exam Location:  Inpatient Procedure: 2D Echo, Cardiac Doppler and Color Doppler (Both Spectral and Color            Flow Doppler were utilized during procedure). Indications:     Cardiomyopathy- ischemic I25.5  History:        Patient has prior history of Echocardiogram examinations, most                 recent 05/27/2021. Cardiomyopathy, CAD, Prior CABG and                 Defibrillator; Risk Factors:Diabetes and Hypertension.  Sonographer:    Meagan Baucom RDCS, FE, PE Referring Phys: [id number] TRACI R TURNER IMPRESSIONS  1. Left ventricular ejection fraction, by estimation, is 30 to 35%. The left ventricle has moderately decreased function. The left ventricle demonstrates global hypokinesis. The left ventricular internal cavity size was mildly dilated. Left ventricular diastolic parameters are consistent with Grade I diastolic dysfunction (impaired relaxation).  2. Right ventricular systolic function is normal. The right ventricular size is normal. There is moderately elevated pulmonary artery systolic pressure. The estimated right ventricular systolic pressure is 49.0 mmHg.  3. The mitral valve is normal in structure. Mild mitral valve regurgitation. No evidence of mitral stenosis.  4. The aortic valve is tricuspid. There is mild thickening of the aortic valve. Aortic valve regurgitation is not visualized. Aortic valve sclerosis is present, with no evidence of aortic valve stenosis.  5. The inferior vena cava is normal in size with <50% respiratory variability, suggesting right atrial pressure of 8 mmHg. FINDINGS  Left Ventricle: Left ventricular ejection fraction, by estimation, is 30 to 35%. The left ventricle has moderately decreased function. The left ventricle demonstrates global hypokinesis. The left ventricular internal cavity size was mildly dilated. There is no left ventricular hypertrophy. Left ventricular diastolic parameters are consistent with Grade I diastolic dysfunction (impaired relaxation). Right Ventricle: The right ventricular size is normal. No increase in right ventricular wall thickness. Right ventricular systolic function is normal. There is moderately elevated  pulmonary artery systolic pressure. The tricuspid regurgitant velocity is 3.20 m/s, and with an assumed right atrial pressure of 8 mmHg, the estimated right ventricular systolic pressure is 49.0 mmHg. Left Atrium: Left atrial size was normal in size. Right Atrium: Right  atrial size was normal in size. Pericardium: There is no evidence of pericardial effusion. Mitral Valve: The mitral valve is normal in structure. Mild mitral valve regurgitation. No evidence of mitral valve stenosis. Tricuspid Valve: The tricuspid valve is normal in structure. Tricuspid valve regurgitation is mild . No evidence of tricuspid stenosis. Aortic Valve: The aortic valve is tricuspid. There is mild thickening of the aortic valve. Aortic valve regurgitation is not visualized. Aortic valve sclerosis is present, with no evidence of aortic valve stenosis. Pulmonic Valve: The pulmonic valve was normal in structure. Pulmonic valve regurgitation is not visualized. No evidence of pulmonic stenosis. Aorta: The aortic root is normal in size and structure. Venous: The inferior vena cava is normal in size with less than 50% respiratory variability, suggesting right atrial pressure of 8 mmHg. IAS/Shunts: No atrial level shunt detected by color flow Doppler.  LEFT VENTRICLE PLAX 2D LVIDd:         5.87 cm   Diastology LVIDs:         4.76 cm   LV e' medial:    5.11 cm/s LV PW:         1.15 cm   LV E/e' medial:  20.9 LV IVS:        1.18 cm   LV e' lateral:   10.90 cm/s LVOT diam:     2.29 cm   LV E/e' lateral: 9.8 LV SV:         63 LV SV Index:   30 LVOT Area:     4.12 cm  RIGHT VENTRICLE RV S prime:     10.00 cm/s TAPSE (M-mode): 1.5 cm LEFT ATRIUM             Index        RIGHT ATRIUM           Index LA diam:        4.38 cm 2.09 cm/m   RA Area:     15.60 cm LA Vol (A2C):   64.7 ml 30.88 ml/m  RA Volume:   38.50 ml  18.38 ml/m LA Vol (A4C):   66.3 ml 31.65 ml/m LA Biplane Vol: 65.9 ml 31.46 ml/m  AORTIC VALVE LVOT Vmax:   83.30 cm/s LVOT Vmean:   55.700 cm/s LVOT VTI:    0.153 m  AORTA Ao Root diam: 3.24 cm MITRAL VALVE                TRICUSPID VALVE MV Area (PHT): 5.02 cm     TR Peak grad:   41.0 mmHg MV Decel Time: 151 msec     TR Vmax:        320.00 cm/s MV E velocity: 107.00 cm/s MV A velocity: 55.10 cm/s   SHUNTS MV E/A ratio:  1.94         Systemic VTI:  0.15 m                             Systemic Diam: 2.29 cm Morene Brownie Electronically signed by Morene Brownie Signature Date/Time: 12/07/2023/5:02:48 PM    Final    VAS US  CAROTID Result Date: 12/07/2023 Carotid Arterial Duplex Study Patient Name:  EGOR FULLILOVE [facility]  Date of Exam:   12/07/2023 Medical Rec #: [id number]            Accession #:    [id number] Date of Birth: 05-03-1966            Patient  Gender: M Patient Age:   73 years Exam Location:  [facility] Procedure:      VAS US  CAROTID Referring Phys: WILBERT TURNER --------------------------------------------------------------------------------  Indications:       Carotid artery disease. Risk Factors:      Hypertension, hyperlipidemia, Diabetes, past history of                    smoking, prior MI, coronary artery disease, PAD. Other Factors:     CKD, CHF, S/P CABG x5, IVD, RT CEA 2010. Comparison Study:  Previous exam on 05/27/2021 - Occluded right carotid system,                    LT ICA 60-79% Performing Technologist: Jody Hill RVT, RDMS  Examination Guidelines: A complete evaluation includes B-mode imaging, spectral Doppler, color Doppler, and power Doppler as needed of all accessible portions of each vessel. Bilateral testing is considered an integral part of a complete examination. Limited examinations for reoccurring indications may be performed as noted.  Right Carotid Findings: +----------+--------+--------+--------+------------------+--------+           PSV cm/sEDV cm/sStenosisPlaque DescriptionComments +----------+--------+--------+--------+------------------+--------+ CCA Prox                  Occluded                            +----------+--------+--------+--------+------------------+--------+ CCA Distal                Occluded                           +----------+--------+--------+--------+------------------+--------+ ICA Prox                  Occluded                           +----------+--------+--------+--------+------------------+--------+ ICA Distal                Occluded                           +----------+--------+--------+--------+------------------+--------+ ECA                       Occluded                           +----------+--------+--------+--------+------------------+--------+ +----------+--------+-------+----------------+-------------------+           PSV cm/sEDV cmsDescribe        Arm Pressure (mmHG) +----------+--------+-------+----------------+-------------------+ Subclavian182            Multiphasic, WNL                    +----------+--------+-------+----------------+-------------------+ +---------+--------+--------+----------------+ VertebralPSV cm/sEDV cm/sappears occluded +---------+--------+--------+----------------+  Left Carotid Findings: +----------+-------+--------+--------+-----------------------+-----------------+           PSV    EDV cm/sStenosisPlaque Description     Comments                    cm/s                                                            +----------+-------+--------+--------+-----------------------+-----------------+  CCA Prox  101    19              calcific and                                                              heterogenous                             +----------+-------+--------+--------+-----------------------+-----------------+ CCA Distal108    25              heterogenous and       intimal                                            calcific               thickening        +----------+-------+--------+--------+-----------------------+-----------------+ ICA Prox   257    89      60-79%  calcific and irregular                   +----------+-------+--------+--------+-----------------------+-----------------+ ICA Mid   216    82      60-79%  calcific and irregular                   +----------+-------+--------+--------+-----------------------+-----------------+ ICA Distal138    61                                                       +----------+-------+--------+--------+-----------------------+-----------------+ ECA       361    29      >50%    calcific and irregular                   +----------+-------+--------+--------+-----------------------+-----------------+ +----------+--------+--------+-----------------------------+-------------------+           PSV cm/sEDV cm/sDescribe                     Arm Pressure (mmHG) +----------+--------+--------+-----------------------------+-------------------+ [id number]             elevated velocities without                                                evidence of stenosis                             +----------+--------+--------+-----------------------------+-------------------+ +---------+--------+--+--------+-+------------------------+ VertebralPSV cm/s66EDV cm/s9Retrograde and turbulent +---------+--------+--+--------+-+------------------------+   Summary: Right Carotid: Evidence consistent with a total occlusion of the right ICA. The                CCA appears occluded. The ECA appears occluded. Left Carotid: Velocities in the left ICA are consistent with a 60-79% stenosis.               Non-hemodynamically significant plaque <50% noted in  the CCA. The               ECA appears >50% stenosed. Vertebrals:  Left vertebral artery demonstrates retrograde/turbulent flow. Right              vertebral artery demonstrates an occlusion. Subclavians: Normal flow hemodynamics were seen in bilateral subclavian              arteries. *See table(s) above for measurements and observations.   Electronically signed by Norman Serve on 12/07/2023 at 2:04:34 PM.    Final    DG Pelvis Portable Result Date: 12/06/2023 CLINICAL DATA:  Status post total hip arthroplasty [id number] EXAM: PORTABLE PELVIS 1-2 VIEWS COMPARISON:  12/03/2023 FINDINGS: Single view of the pelvis demonstrates a left total hip arthroplasty. Left hip appears located on this single view. The entire left femoral stem is visualized. No acute abnormality to the right hip. IMPRESSION: Status post left total hip arthroplasty without complicating features. Electronically Signed   By: Juliene Balder M.D.   On: 12/06/2023 12:41   DG C-Arm 1-60 Min-No Report Result Date: 12/06/2023 Fluoroscopy was utilized by the requesting physician.  No radiographic interpretation.   DG C-Arm 1-60 Min-No Report Result Date: 12/06/2023 Fluoroscopy was utilized by the requesting physician.  No radiographic interpretation.   DG Knee Left Port Result Date: 12/03/2023 CLINICAL DATA:  Fall EXAM: PORTABLE LEFT KNEE - 1-2 VIEW COMPARISON:  None Available. FINDINGS: No evidence of fracture, dislocation, or joint effusion. There are mild degenerative osteophytes of the knee with mild medial compartment joint space narrowing. Peripheral vascular calcifications are present. Tissues otherwise within normal limits. IMPRESSION: 1. No acute fracture or dislocation. 2. Mild degenerative changes. Electronically Signed   By: Greig Pique M.D.   On: 12/03/2023 21:13   DG Chest Portable 1 View Result Date: 12/03/2023 CLINICAL DATA:  Weakness EXAM: PORTABLE CHEST 1 VIEW COMPARISON:  05/26/2021 FINDINGS: Multiple benign calcified granuloma are seen within the right lung. Lungs are otherwise clear. No pneumothorax or pleural effusion. Coronary artery bypass grafting has been performed. Cardiac size within limits. Left subclavian single lead pacemaker defibrillator is unchanged. Pulmonary vascularity is normal. No acute bone abnormality IMPRESSION: 1. No active disease. Electronically  Signed   By: Dorethia Molt M.D.   On: 12/03/2023 19:58   DG HIP UNILAT WITH PELVIS 2-3 VIEWS LEFT Result Date: 12/03/2023 CLINICAL DATA:  Pain after a fall EXAM: DG HIP (WITH OR WITHOUT PELVIS) 2-3V LEFT COMPARISON:  CT abdomen and pelvis 06/11/2016 FINDINGS: Transverse fracture through the left femoral neck with varus angulation of the proximal femur. No dislocation at the hip joint. No inter trochanteric involvement is identified. Degenerative changes in the left hip as well as in the right hip and lower lumbar spine. Visualized pelvis appears intact. IMPRESSION: Transverse fracture of the left femoral neck with varus angulation. Electronically Signed   By: Elsie Gravely M.D.   On: 12/03/2023 19:17   DG Elbow Complete Left Result Date: 12/03/2023 CLINICAL DATA:  Pain after a fall.  Skin lacerations. EXAM: LEFT ELBOW - COMPLETE 3+ VIEW COMPARISON:  None Available. FINDINGS: Surgical clip in the antecubital fossa. Soft tissues are otherwise unremarkable. No radiopaque foreign bodies or soft tissue gas. Bones appear intact. No evidence of acute fracture or dislocation. No focal bone lesion or bone destruction. No significant effusion. IMPRESSION: No acute bony abnormalities. Electronically Signed   By: Elsie Gravely M.D.   On: 12/03/2023 19:16    Microbiology: Results for orders placed or performed  during the hospital encounter of 12/03/23  Surgical PCR screen     Status: None   Collection Time: 12/04/23 12:02 PM   Specimen: Nasal Mucosa; Nasal Swab  Result Value Ref Range Status   MRSA, PCR NEGATIVE NEGATIVE Final   Staphylococcus aureus NEGATIVE NEGATIVE Final    Comment: (NOTE) The Xpert SA Assay (FDA approved for NASAL specimens in patients 52 years of age and older), is one component of a comprehensive surveillance program. It is not intended to diagnose infection nor to guide or monitor treatment. Performed at [facility], [address] [place], [place] [address]      Labs: CBC: Recent Labs  Lab 12/03/23 1302 12/03/23 1615 12/04/23 0248 12/05/23 0807 12/06/23 0540 12/07/23 0654  WBC 8.2 17.5* 10.6* 8.9 11.8* 12.6*  NEUTROABS 5.0 14.1*  --   --   --  9.3*  HGB 15.3 14.7 13.2 11.4* 11.6* 9.7*  HCT 48.2 44.4 39.7 35.7* 35.5* 30.9*  MCV 85 81.6 81.4 83.4 82.6 84.0  PLT 220 239 199 152 155 151   Basic Metabolic Panel: Recent Labs  Lab 12/04/23 0248 12/05/23 0807 12/06/23 0540 12/07/23 0654 12/08/23 0619  NA 136 139 136 137 133*  K 4.7 4.4 4.3 4.9 4.3  CL 104 109 105 107 103  CO2 23 22 22  21* 23  GLUCOSE 318* 174* 297* 210* 248*  BUN 31* 28* 27* 26* 39*  CREATININE 2.02* 2.37* 2.07* 2.05* 2.19*  CALCIUM  8.2* 8.3* 8.1* 8.0* 8.0*  MG 2.2 2.1  --   --   --    Liver Function Tests: Recent Labs  Lab 12/03/23 1302 12/03/23 1615  AST 10 21  ALT 9 13  ALKPHOS 125* 106  BILITOT <0.2 0.7  PROT 6.4 7.2  ALBUMIN  3.5* 3.2*   CBG: Recent Labs  Lab 12/07/23 2001 12/08/23 0009 12/08/23 0503 12/08/23 0810 12/08/23 1108  GLUCAP 351* 257* 237* 219* 403*    Discharge time spent:45 minutes.  Signed: [name], MD [facility] 12/08/2023

## 2023-12-08 NOTE — Care Management Important Message (Signed)
 Important Message  Patient Details  Name: RAMZY CAPPELLETTI MRN: [id number] Date of Birth: [birth year]   Important Message Given:  Yes - Medicare IM     Jon Cruel 12/08/2023, 11:21 AM

## 2023-12-08 NOTE — Progress Notes (Signed)
 Per [name], MD foley kept in for discharge.

## 2023-12-08 NOTE — Progress Notes (Signed)
 Reviewed AVS, patient expressed understanding of medications, MD follow up reviewed.    Removed IV, Site clean, dry and intact.   Patient states all belongings brought to the hospital at time of admission are accounted for and packed to take home.  Transported  patient to entrance A where family member was waiting in vehicle to [employer].

## 2023-12-08 NOTE — Progress Notes (Addendum)
 PROGRESS NOTE    Douglas Carr  FMW:[id number] DOB: Mar 10, 1966 DOA: 12/03/2023 PCP: [name], [name], MD   Brief Narrative:    58 y.o. male with medical history significant for hypertension, insulin -dependent diabetes mellitus, CAD status post stents and CABG, chronic HFrEF, and CKD 3A who presents with severe left leg pain after a fall.  Patient has been admitted for left hip fracture., underwent left THA on 8/3. [facility] placement. Assessment & Plan:  Principal Problem:   Closed left hip fracture, initial encounter [facility]) Active Problems:   Coronary artery disease involving native coronary artery of native heart with angina pectoris (HCC)   Type 2 diabetes mellitus with obesity (HCC)   Acute renal failure superimposed on stage 3a chronic kidney disease (HCC)   Chronic HFrEF (heart failure with reduced ejection fraction) (HCC)   Prolonged QT interval   Malnutrition of moderate degree   S/P total left hip arthroplasty    Left hip fracture secondary to mechanical fall: -s/p Left THA done on 8/3. POD#2 -PT OT eval  -Pain management -SNF placement on discharge    AKI superimposed on CKD 3A : Improving - f/u renal function closely -Avoid nephrotoxic meds - He did receive IVF -Creatinine is 2.19 today   CAD s/p CABG  - No recent angina  - continue ASA,Plavix ,beta-blocker, statin, and nitrates    Chronic HFrEF,POA: NYHA II  - Appears compensated  - Monitor weight and I/Os, continue Coreg ,hydralazine  and nitrates.    Carotid artery disease,POA:  US  Carotid doppler showed Evidence consistent with a total occlusion of the right  ICA. The CCA appears occluded. The ECA appears occluded.  Left Carotid: Velocities in the left ICA are consistent with a 60-79%  stenosis. Non-hemodynamically significant plaque <50% noted in the  CCA. The  ECA appears >50% stenosed.  Vertebrals:  Left vertebral artery demonstrates retrograde/turbulent flow.  Right vertebral artery  demonstrates an occlusion.  Subclavians: Normal flow hemodynamics were seen in bilateral subclavian arteries.  Continue with aspirin  and statin   Insulin -dependent type 2 DM, uncontrolled - A1c 13.1% - Continue with both long acting 12 units daily and SSI along with short acting insulin  3 units TID scheduled -Diabetes coordinator consulted.   Prolonged QT interval  - avoid QT-prolonging medications     Urinary retention with BPH - Continue tamsulosin  - Requiring frequent in and out cath  Disposition: SNF/Rehab placement. PT OT on board..  DVT prophylaxis: SCDs Start: 12/06/23 1218 Place TED hose Start: 12/06/23 1218 SCDs Start: 12/04/23 0048     Code Status: Full Code Family Communication:  Daughter at the bedside Status is: Inpatient Remains inpatient appropriate because: Left hip fracture  Subjective:  He couldn't sleep last night. Pain is well controlled. Daughter is at the bedside and she would want him to go to SNF in [facility]. No acute issues or concerns.   Examination:  General exam: Appears calm and comfortable, absence of right eye and closed eyelids Respiratory system: Clear to auscultation. Respiratory effort normal. Cardiovascular system: S1 & S2 heard, RRR. No JVD, murmurs, rubs, gallops or clicks. No pedal edema. Gastrointestinal system: Abdomen is nondistended, soft and nontender. No organomegaly or masses felt. Normal bowel sounds heard. Central nervous system: Alert and oriented. No focal neurological deficits. Extremities: s/p left hip surgery, dressing in place, no evidence of discharge Skin: No rashes, lesions or ulcers Psychiatry: Judgement and insight appear normal. Mood & affect appropriate.     Diet Orders (From admission, onward)  Start     Ordered   12/06/23 1218  Diet regular Room service appropriate? Yes; Fluid consistency: Thin  Diet effective now       Question Answer Comment  Room service appropriate? Yes   Fluid  consistency: Thin      12/06/23 1217            Objective: Vitals:   12/07/23 2121 12/08/23 0504 12/08/23 0735 12/08/23 0842  BP: (!) 126/58 (!) 147/59 (!) 115/53 (!) 115/53  Pulse: 68 81 71 71  Resp:  18 17   Temp:  98.9 F (37.2 C) 98.3 F (36.8 C)   TempSrc:   Oral   SpO2:  96% 98%   Weight:      Height:        Intake/Output Summary (Last 24 hours) at 12/08/2023 0903 Last data filed at 12/08/2023 0500 Gross per 24 hour  Intake 240 ml  Output 1340 ml  Net -1100 ml   Filed Weights   12/03/23 1514 12/06/23 0712 12/07/23 0500  Weight: 91.6 kg 91.6 kg 91.5 kg    Scheduled Meds:  acetaminophen   1,000 mg Oral Q6H   aspirin  EC  81 mg Oral Daily   carvedilol   3.125 mg Oral Q12H   Chlorhexidine  Gluconate Cloth  6 each Topical Daily   cholecalciferol   2,000 Units Oral Daily   clopidogrel   75 mg Oral QPM   hydrALAZINE   10 mg Oral Q8H   insulin  aspart  0-15 Units Subcutaneous TID WC   insulin  aspart  0-5 Units Subcutaneous QHS   insulin  aspart  3 Units Subcutaneous TID WC   insulin  glargine-yfgn  10 Units Subcutaneous QHS   isosorbide  dinitrate  5 mg Oral BID   mirtazapine   7.5 mg Oral QHS   multivitamin with minerals  1 tablet Oral Daily   polyethylene glycol  17 g Oral BID   rosuvastatin   40 mg Oral Daily   senna  2 tablet Oral QHS   tamsulosin   0.4 mg Oral Daily   Continuous Infusions:  sodium chloride  75 mL/hr at 12/06/23 1756    Nutritional status Signs/Symptoms: mild fat depletion, mild muscle depletion Interventions: MVI, Glucerna shake, Ensure Enlive (each supplement provides 350kcal and 20 grams of protein) Body mass index is 28.94 kg/m.  Data Reviewed:   CBC: Recent Labs  Lab 12/03/23 1302 12/03/23 1615 12/04/23 0248 12/05/23 0807 12/06/23 0540 12/07/23 0654  WBC 8.2 17.5* 10.6* 8.9 11.8* 12.6*  NEUTROABS 5.0 14.1*  --   --   --  9.3*  HGB 15.3 14.7 13.2 11.4* 11.6* 9.7*  HCT 48.2 44.4 39.7 35.7* 35.5* 30.9*  MCV 85 81.6 81.4 83.4 82.6  84.0  PLT 220 239 199 152 155 151   Basic Metabolic Panel: Recent Labs  Lab 12/04/23 0248 12/05/23 0807 12/06/23 0540 12/07/23 0654 12/08/23 0619  NA 136 139 136 137 133*  K 4.7 4.4 4.3 4.9 4.3  CL 104 109 105 107 103  CO2 23 22 22  21* 23  GLUCOSE 318* 174* 297* 210* 248*  BUN 31* 28* 27* 26* 39*  CREATININE 2.02* 2.37* 2.07* 2.05* 2.19*  CALCIUM  8.2* 8.3* 8.1* 8.0* 8.0*  MG 2.2 2.1  --   --   --    GFR: Estimated Creatinine Clearance: 42.3 mL/min (A) (by C-G formula based on SCr of 2.19 mg/dL (H)). Liver Function Tests: Recent Labs  Lab 12/03/23 1302 12/03/23 1615  AST 10 21  ALT 9 13  ALKPHOS 125* 106  BILITOT <  0.2 0.7  PROT 6.4 7.2  ALBUMIN  3.5* 3.2*   No results for input(s): LIPASE, AMYLASE in the last 168 hours. No results for input(s): AMMONIA in the last 168 hours. Coagulation Profile: Recent Labs  Lab 12/03/23 1615  INR 0.9   Cardiac Enzymes: No results for input(s): CKTOTAL, CKMB, CKMBINDEX, TROPONINI in the last 168 hours. BNP (last 3 results) No results for input(s): PROBNP in the last 8760 hours. HbA1C: No results for input(s): HGBA1C in the last 72 hours.  CBG: Recent Labs  Lab 12/07/23 1628 12/07/23 2001 12/08/23 0009 12/08/23 0503 12/08/23 0810  GLUCAP 411* 351* 257* 237* 219*   Lipid Profile: No results for input(s): CHOL, HDL, LDLCALC, TRIG, CHOLHDL, LDLDIRECT in the last 72 hours.  Thyroid Function Tests: Recent Labs    12/05/23 1616  TSH 3.493   Anemia Panel: No results for input(s): VITAMINB12, FOLATE, FERRITIN, TIBC, IRON, RETICCTPCT in the last 72 hours. Sepsis Labs: No results for input(s): PROCALCITON, LATICACIDVEN in the last 168 hours.  Recent Results (from the past 240 hours)  Surgical PCR screen     Status: None   Collection Time: 12/04/23 12:02 PM   Specimen: Nasal Mucosa; Nasal Swab  Result Value Ref Range Status   MRSA, PCR NEGATIVE NEGATIVE Final    Staphylococcus aureus NEGATIVE NEGATIVE Final    Comment: (NOTE) The Xpert SA Assay (FDA approved for NASAL specimens in patients 60 years of age and older), is one component of a comprehensive surveillance program. It is not intended to diagnose infection nor to guide or monitor treatment. Performed at [facility], [address] [place], [place] [address]          Radiology Studies: ECHOCARDIOGRAM COMPLETE Result Date: 12/07/2023    ECHOCARDIOGRAM REPORT   Patient Name:   DEJOUR VOS Date of Exam: 12/07/2023 Medical Rec #:  [id number]           Height:       70.0 in Accession #:    [id number]          Weight:       201.7 lb Date of Birth:  30-Jun-1965           BSA:          2.095 m Patient Age:    58 years            BP:           135/63 mmHg Patient Gender: M                   HR:           78 bpm. Exam Location:  Inpatient Procedure: 2D Echo, Cardiac Doppler and Color Doppler (Both Spectral and Color            Flow Doppler were utilized during procedure). Indications:    Cardiomyopathy- ischemic I25.5  History:        Patient has prior history of Echocardiogram examinations, most                 recent 05/27/2021. Cardiomyopathy, CAD, Prior CABG and                 Defibrillator; Risk Factors:Diabetes and Hypertension.  Sonographer:    Meagan Baucom RDCS, FE, PE Referring Phys: [id number] TRACI R TURNER IMPRESSIONS  1. Left ventricular ejection fraction, by estimation, is 30 to 35%. The left ventricle has moderately decreased function. The left ventricle demonstrates global hypokinesis.  The left ventricular internal cavity size was mildly dilated. Left ventricular diastolic parameters are consistent with Grade I diastolic dysfunction (impaired relaxation).  2. Right ventricular systolic function is normal. The right ventricular size is normal. There is moderately elevated pulmonary artery systolic pressure. The estimated right ventricular systolic pressure is 49.0 mmHg.  3. The mitral  valve is normal in structure. Mild mitral valve regurgitation. No evidence of mitral stenosis.  4. The aortic valve is tricuspid. There is mild thickening of the aortic valve. Aortic valve regurgitation is not visualized. Aortic valve sclerosis is present, with no evidence of aortic valve stenosis.  5. The inferior vena cava is normal in size with <50% respiratory variability, suggesting right atrial pressure of 8 mmHg. FINDINGS  Left Ventricle: Left ventricular ejection fraction, by estimation, is 30 to 35%. The left ventricle has moderately decreased function. The left ventricle demonstrates global hypokinesis. The left ventricular internal cavity size was mildly dilated. There is no left ventricular hypertrophy. Left ventricular diastolic parameters are consistent with Grade I diastolic dysfunction (impaired relaxation). Right Ventricle: The right ventricular size is normal. No increase in right ventricular wall thickness. Right ventricular systolic function is normal. There is moderately elevated pulmonary artery systolic pressure. The tricuspid regurgitant velocity is 3.20 m/s, and with an assumed right atrial pressure of 8 mmHg, the estimated right ventricular systolic pressure is 49.0 mmHg. Left Atrium: Left atrial size was normal in size. Right Atrium: Right atrial size was normal in size. Pericardium: There is no evidence of pericardial effusion. Mitral Valve: The mitral valve is normal in structure. Mild mitral valve regurgitation. No evidence of mitral valve stenosis. Tricuspid Valve: The tricuspid valve is normal in structure. Tricuspid valve regurgitation is mild . No evidence of tricuspid stenosis. Aortic Valve: The aortic valve is tricuspid. There is mild thickening of the aortic valve. Aortic valve regurgitation is not visualized. Aortic valve sclerosis is present, with no evidence of aortic valve stenosis. Pulmonic Valve: The pulmonic valve was normal in structure. Pulmonic valve regurgitation is  not visualized. No evidence of pulmonic stenosis. Aorta: The aortic root is normal in size and structure. Venous: The inferior vena cava is normal in size with less than 50% respiratory variability, suggesting right atrial pressure of 8 mmHg. IAS/Shunts: No atrial level shunt detected by color flow Doppler.  LEFT VENTRICLE PLAX 2D LVIDd:         5.87 cm   Diastology LVIDs:         4.76 cm   LV e' medial:    5.11 cm/s LV PW:         1.15 cm   LV E/e' medial:  20.9 LV IVS:        1.18 cm   LV e' lateral:   10.90 cm/s LVOT diam:     2.29 cm   LV E/e' lateral: 9.8 LV SV:         63 LV SV Index:   30 LVOT Area:     4.12 cm  RIGHT VENTRICLE RV S prime:     10.00 cm/s TAPSE (M-mode): 1.5 cm LEFT ATRIUM             Index        RIGHT ATRIUM           Index LA diam:        4.38 cm 2.09 cm/m   RA Area:     15.60 cm LA Vol (A2C):   64.7 ml 30.88 ml/m  RA  Volume:   38.50 ml  18.38 ml/m LA Vol (A4C):   66.3 ml 31.65 ml/m LA Biplane Vol: 65.9 ml 31.46 ml/m  AORTIC VALVE LVOT Vmax:   83.30 cm/s LVOT Vmean:  55.700 cm/s LVOT VTI:    0.153 m  AORTA Ao Root diam: 3.24 cm MITRAL VALVE                TRICUSPID VALVE MV Area (PHT): 5.02 cm     TR Peak grad:   41.0 mmHg MV Decel Time: 151 msec     TR Vmax:        320.00 cm/s MV E velocity: 107.00 cm/s MV A velocity: 55.10 cm/s   SHUNTS MV E/A ratio:  1.94         Systemic VTI:  0.15 m                             Systemic Diam: 2.29 cm Morene Brownie Electronically signed by Morene Brownie Signature Date/Time: 12/07/2023/5:02:48 PM    Final    VAS US  CAROTID Result Date: 12/07/2023 Carotid Arterial Duplex Study Patient Name:  ANDRES VEST [facility]  Date of Exam:   12/07/2023 Medical Rec #: [id number]            Accession #:    [id number] Date of Birth: [birth year]            Patient Gender: M Patient Age:   89 years Exam Location:  [facility] Procedure:      VAS US  CAROTID Referring Phys: WILBERT TURNER  --------------------------------------------------------------------------------  Indications:       Carotid artery disease. Risk Factors:      Hypertension, hyperlipidemia, Diabetes, past history of                    smoking, prior MI, coronary artery disease, PAD. Other Factors:     CKD, CHF, S/P CABG x5, IVD, RT CEA 2010. Comparison Study:  Previous exam on 05/27/2021 - Occluded right carotid system,                    LT ICA 60-79% Performing Technologist: Jody Hill RVT, RDMS  Examination Guidelines: A complete evaluation includes B-mode imaging, spectral Doppler, color Doppler, and power Doppler as needed of all accessible portions of each vessel. Bilateral testing is considered an integral part of a complete examination. Limited examinations for reoccurring indications may be performed as noted.  Right Carotid Findings: +----------+--------+--------+--------+------------------+--------+           PSV cm/sEDV cm/sStenosisPlaque DescriptionComments +----------+--------+--------+--------+------------------+--------+ CCA Prox                  Occluded                           +----------+--------+--------+--------+------------------+--------+ CCA Distal                Occluded                           +----------+--------+--------+--------+------------------+--------+ ICA Prox                  Occluded                           +----------+--------+--------+--------+------------------+--------+ ICA Distal  Occluded                           +----------+--------+--------+--------+------------------+--------+ ECA                       Occluded                           +----------+--------+--------+--------+------------------+--------+ +----------+--------+-------+----------------+-------------------+           PSV cm/sEDV cmsDescribe        Arm Pressure (mmHG) +----------+--------+-------+----------------+-------------------+ Subclavian182             Multiphasic, WNL                    +----------+--------+-------+----------------+-------------------+ +---------+--------+--------+----------------+ VertebralPSV cm/sEDV cm/sappears occluded +---------+--------+--------+----------------+  Left Carotid Findings: +----------+-------+--------+--------+-----------------------+-----------------+           PSV    EDV cm/sStenosisPlaque Description     Comments                    cm/s                                                            +----------+-------+--------+--------+-----------------------+-----------------+ CCA Prox  101    19              calcific and                                                              heterogenous                             +----------+-------+--------+--------+-----------------------+-----------------+ CCA Distal108    25              heterogenous and       intimal                                            calcific               thickening        +----------+-------+--------+--------+-----------------------+-----------------+ ICA Prox  257    89      60-79%  calcific and irregular                   +----------+-------+--------+--------+-----------------------+-----------------+ ICA Mid   216    82      60-79%  calcific and irregular                   +----------+-------+--------+--------+-----------------------+-----------------+ ICA Distal138    61                                                       +----------+-------+--------+--------+-----------------------+-----------------+ ECA  361    29      >50%    calcific and irregular                   +----------+-------+--------+--------+-----------------------+-----------------+ +----------+--------+--------+-----------------------------+-------------------+           PSV cm/sEDV cm/sDescribe                     Arm Pressure (mmHG)  +----------+--------+--------+-----------------------------+-------------------+ [id number]             elevated velocities without                                                evidence of stenosis                             +----------+--------+--------+-----------------------------+-------------------+ +---------+--------+--+--------+-+------------------------+ VertebralPSV cm/s66EDV cm/s9Retrograde and turbulent +---------+--------+--+--------+-+------------------------+   Summary: Right Carotid: Evidence consistent with a total occlusion of the right ICA. The                CCA appears occluded. The ECA appears occluded. Left Carotid: Velocities in the left ICA are consistent with a 60-79% stenosis.               Non-hemodynamically significant plaque <50% noted in the CCA. The               ECA appears >50% stenosed. Vertebrals:  Left vertebral artery demonstrates retrograde/turbulent flow. Right              vertebral artery demonstrates an occlusion. Subclavians: Normal flow hemodynamics were seen in bilateral subclavian              arteries. *See table(s) above for measurements and observations.  Electronically signed by Norman Serve on 12/07/2023 at 2:04:34 PM.    Final    DG Pelvis Portable Result Date: 12/06/2023 CLINICAL DATA:  Status post total hip arthroplasty [id number] EXAM: PORTABLE PELVIS 1-2 VIEWS COMPARISON:  12/03/2023 FINDINGS: Single view of the pelvis demonstrates a left total hip arthroplasty. Left hip appears located on this single view. The entire left femoral stem is visualized. No acute abnormality to the right hip. IMPRESSION: Status post left total hip arthroplasty without complicating features. Electronically Signed   By: Juliene Balder M.D.   On: 12/06/2023 12:41   DG C-Arm 1-60 Min-No Report Result Date: 12/06/2023 Fluoroscopy was utilized by the requesting physician.  No radiographic interpretation.   DG C-Arm 1-60 Min-No Report Result Date:  12/06/2023 Fluoroscopy was utilized by the requesting physician.  No radiographic interpretation.      LOS: 5 days   Time spent= 40 mins    [name], MD [facility]  If 7PM-7AM, please contact night-coverage  12/08/2023, 9:03 AM

## 2023-12-08 NOTE — Progress Notes (Signed)
 Physical Therapy Treatment Patient Details Name: Douglas Carr MRN: [id number] DOB: 05/24/1965 Today's Date: 12/08/2023   History of Present Illness Pt is 58 yo male who presents on 12/03/23   after a fall outside resulting in ability to bear wt on LLE. Pt with L femoral neck fx, underwent L anterior THA on 12/06/23. Pt with PMH: R carotid endarterectomy, HTN, CAD, CABG, CHF, ICD, DM, kidney disease, CVA, absent R eye.    PT Comments  Pt moving much better today than yesterday but continues to have significant balance deficits with high fall risk. Ambulated 100' with RW with min A. Had LOB in static standing while trying to use UE's to pull up pants, min A to correct. Pt takes 2 steps RLE to 1 step LLE at times, worked on evening out gait pattern. Patient will benefit from continued inpatient follow up therapy, <3 hours/day. PT will continue to follow.     If plan is discharge home, recommend the following: A lot of help with walking and/or transfers;A lot of help with bathing/dressing/bathroom;Assistance with cooking/housework;Help with stairs or ramp for entrance;Assist for transportation   Can travel by private vehicle     Yes  Equipment Recommendations  None recommended by PT    Recommendations for Other Services       Precautions / Restrictions Precautions Precautions: Fall Recall of Precautions/Restrictions: Intact Precaution/Restrictions Comments: pt verbalizes his fall risk but still tends to be impulsive Restrictions Weight Bearing Restrictions Per Provider Order: Yes LLE Weight Bearing Per Provider Order: Weight bearing as tolerated     Mobility  Bed Mobility               General bed mobility comments: pt received in chair    Transfers Overall transfer level: Needs assistance Equipment used: Rolling walker (2 wheels) Transfers: Sit to/from Stand Sit to Stand: Supervision   Step pivot transfers: Supervision       General transfer comment: pt doing  much better lifting feet today    Ambulation/Gait Ambulation/Gait assistance: Contact guard assist Gait Distance (Feet): 100 Feet Assistive device: Rolling walker (2 wheels) Gait Pattern/deviations: Step-through pattern Gait velocity: decreased Gait velocity interpretation: <1.8 ft/sec, indicate of risk for recurrent falls   General Gait Details: pt able to pick up feet today but sometimes takes 2 steps with R foot for 1 with L, worked on evening out gait pattern.   Stairs             Wheelchair Mobility     Tilt Bed    Modified Rankin (Stroke Patients Only)       Balance Overall balance assessment: Needs assistance, History of Falls Sitting-balance support: No upper extremity supported, Feet supported Sitting balance-Leahy Scale: Good     Standing balance support: Bilateral upper extremity supported, During functional activity, Reliant on assistive device for balance Standing balance-Leahy Scale: Poor Standing balance comment: loses balance without UE support, even in static standing. Needed min A to pull pants up in standing when getting dressed                            Communication Communication Communication: No apparent difficulties  Cognition Arousal: Alert Behavior During Therapy: WFL for tasks assessed/performed   PT - Cognitive impairments: Safety/Judgement, Memory                       PT - Cognition Comments: good recall of education given yesterday  Following commands: Intact      Cueing Cueing Techniques: Verbal cues  Exercises      General Comments General comments (skin integrity, edema, etc.): VSS. Plan to d/c to SNF today. Reviewed HEP. Pt performed on his own today      Pertinent Vitals/Pain Pain Assessment Pain Assessment: Faces Faces Pain Scale: Hurts even more Pain Location: L hip Pain Descriptors / Indicators: Discomfort, Sore Pain Intervention(s): Limited activity within patient's tolerance, Monitored  during session    Home Living                          Prior Function            PT Goals (current goals can now be found in the care plan section) Acute Rehab PT Goals Patient Stated Goal: get independent and then return home PT Goal Formulation: With patient Time For Goal Achievement: 12/21/23 Potential to Achieve Goals: Good Progress towards PT goals: Progressing toward goals    Frequency    Min 2X/week      PT Plan      Co-evaluation              AM-PAC PT 6 Clicks Mobility   Outcome Measure  Help needed turning from your back to your side while in a flat bed without using bedrails?: A Little Help needed moving from lying on your back to sitting on the side of a flat bed without using bedrails?: A Little Help needed moving to and from a bed to a chair (including a wheelchair)?: A Little Help needed standing up from a chair using your arms (e.g., wheelchair or bedside chair)?: A Little Help needed to walk in hospital room?: A Lot Help needed climbing 3-5 steps with a railing? : Total 6 Click Score: 15    End of Session Equipment Utilized During Treatment: Gait belt Activity Tolerance: Patient tolerated treatment well Patient left: in chair;with call bell/phone within reach Nurse Communication: Mobility status PT Visit Diagnosis: Unsteadiness on feet (R26.81);History of falling (Z91.81);Difficulty in walking, not elsewhere classified (R26.2);Pain Pain - Right/Left: Left Pain - part of body: Hip     Time: 1510-1533 PT Time Calculation (min) (ACUTE ONLY): 23 min  Charges:    $Gait Training: 8-22 mins $Therapeutic Activity: 8-22 mins PT General Charges $$ ACUTE PT VISIT: 1 Visit                     Douglas Carr, PT  Acute Rehab Services Secure chat preferred Office [phone]    Douglas Carr 12/08/2023, 4:13 PM

## 2023-12-09 DIAGNOSIS — E1169 Type 2 diabetes mellitus with other specified complication: Secondary | ICD-10-CM | POA: Insufficient documentation

## 2023-12-11 MED FILL — Fentanyl Citrate Preservative Free (PF) Inj 100 MCG/2ML: INTRAMUSCULAR | Qty: 2 | Status: AC

## 2023-12-18 ENCOUNTER — Ambulatory Visit: Payer: Self-pay | Admitting: Family Medicine

## 2023-12-18 LAB — LAB REPORT - SCANNED: EGFR: 45

## 2023-12-20 LAB — BASIC METABOLIC PANEL (CC13): EGFR: 50

## 2023-12-22 ENCOUNTER — Telehealth: Payer: Self-pay

## 2023-12-22 NOTE — Transitions of Care (Post Inpatient/ED Visit) (Signed)
 12/22/2023  Name: Douglas Carr MRN: [id number] DOB: 02-23-1966  Today's TOC FU Call Status: Today's TOC FU Call Status:: Successful TOC FU Call Completed TOC FU Call Complete Date: 12/22/23 Patient's Name and Date of Birth confirmed.  Transition Care Management Follow-up Telephone Call Date of Discharge: 12/21/23 Discharge Facility: Other [facility]) Name of Other (Non[facility]) Discharge Facility: [facility] Type of Discharge: Inpatient Admission Primary Inpatient Discharge Diagnosis:: hip replacement How have you been since you were released from the hospital?: Better Any questions or concerns?: No  Items Reviewed: Did you receive and understand the discharge instructions provided?: Yes Medications obtained,verified, and reconciled?: Yes (Medications Reviewed) Any new allergies since your discharge?: No Dietary orders reviewed?: Yes Do you have support at home?: Yes People in Home [RPT]: child(ren), adult  Medications Reviewed Today: Medications Reviewed Today     Reviewed by Emmitt Pan, LPN (Licensed Practical Nurse) on 12/22/23 at 1024  Med List Status: <None>   Medication Order Taking? Sig Documenting Provider Last Dose Status Informant  aspirin  EC (ASPIRIN  LOW DOSE) 81 MG tablet [id number] Yes Take 1 tablet (81 mg total) by mouth daily. Swallow whole. [name], [name], MD  Active Self, Pharmacy Records  atorvastatin  (LIPITOR ) 80 MG tablet [id number]  Take 1 tablet (80 mg total) by mouth every evening.  Patient not taking: Reported on 12/22/2023   [name], [name], MD  Active Self, Pharmacy Records  carvedilol  (COREG ) 3.125 MG tablet [id number]  Take 1 tablet (3.125 mg total) by mouth every 12 (twelve) hours.  Patient not taking: Reported on 12/22/2023   [name], [name], MD  Active Self, Pharmacy Records  cholecalciferol  (CHOLECALCIFEROL ) 25 MCG tablet [id number] Yes Take 2 tablets (2,000 Units total) by mouth daily. Rashid, Farhan, MD  Active    clopidogrel  (PLAVIX ) 75 MG tablet [id number]  Take 1 tablet (75 mg total) by mouth every evening.  Patient not taking: Reported on 12/22/2023   [name], [name], MD  Active Self, Pharmacy Records  hydrALAZINE  (APRESOLINE ) 10 MG tablet [id number] Yes Take 1 tablet (10 mg total) by mouth every 8 (eight) hours. Rashid, Farhan, MD  Active   ibuprofen (ADVIL) 200 MG tablet [id number] Yes Take 400 mg by mouth 2 (two) times daily as needed for headache or moderate pain (pain score 4-6). [provider]  Active Self, Pharmacy Records  insulin  aspart (NOVOLOG ) 100 UNIT/ML injection [id number] Yes Inject 5 Units into the skin 3 (three) times daily with meals. Rashid, Farhan, MD  Active   insulin  aspart (NOVOLOG ) 100 UNIT/ML injection [id number] Yes Inject 0-15 Units into the skin 3 (three) times daily with meals. Rashid, Farhan, MD  Active   insulin  glargine-yfgn (SEMGLEE ) 100 UNIT/ML injection [id number] Yes Inject 0.12 mLs (12 Units total) into the skin at bedtime. Rashid, Farhan, MD  Active   isosorbide  dinitrate (ISORDIL ) 5 MG tablet [id number]  Take 1 tablet (5 mg total) by mouth 2 (two) times daily. Please contact the office to schedule appointment for additional refills. 1st Attempt.  Patient not taking: Reported on 12/22/2023   [name], [name], MD  Active Self, Pharmacy Records  METFORMIN  HCL PO [id number] Yes Take 500 mg by mouth at bedtime. [provider]  Active Self, Pharmacy Records           Med Note (COFFELL, JON CHRISTELLA   Sat Dec 05, 2023 11:03 AM) No fill history found - pt states he has a leftover supply at home and started taking again due to blood sugar. Pt unable  to confirm if it is ER or IR formulation.  methocarbamol  (ROBAXIN ) 500 MG tablet [id number] Yes Take 1 tablet (500 mg total) by mouth every 6 (six) hours as needed for muscle spasms. Rashid, Farhan, MD  Active   mirtazapine  (REMERON ) 30 MG tablet [id number] Yes Take 30 mg by mouth at bedtime. [provider]   Active Self, Pharmacy Records  Multiple Vitamin (MULTIVITAMIN WITH MINERALS) TABS tablet [id number] Yes Take 1 tablet by mouth daily. Dino Antu, MD  Active   oxyCODONE  (ROXICODONE ) 5 MG immediate release tablet [id number] Yes Take 1 tablet (5 mg total) by mouth every 8 (eight) hours as needed for severe pain (pain score 7-10). Rashid, Farhan, MD  Active   polyethylene glycol (MIRALAX  / GLYCOLAX ) 17 g packet [id number] Yes Take 17 g by mouth 2 (two) times daily. Rashid, Farhan, MD  Active   Semaglutide ,0.25 or 0.5MG /DOS, 2 MG/3ML NELMA [id number]  Inject 0.25 mg into the skin once a week.  Patient not taking: Reported on 12/22/2023   [name], [name], MD  Active Self, Pharmacy Records  tamsulosin  (FLOMAX ) 0.4 MG CAPS capsule [id number]  Take 1 capsule (0.4 mg total) by mouth daily.  Patient not taking: Reported on 12/22/2023   [name], [name], MD  Active Self, Pharmacy Records            Home Care and Equipment/Supplies: Were Home Health Services Ordered?: Yes Name of Home Health Agency:: unknown Has Agency set up a time to come to your home?: Yes First Home Health Visit Date: 12/23/23 Any new equipment or medical supplies ordered?: Yes Name of Medical supply agency?: unknown Were you able to get the equipment/medical supplies?: Yes Do you have any questions related to the use of the equipment/supplies?: No  Functional Questionnaire: Do you need assistance with bathing/showering or dressing?: No Do you need assistance with meal preparation?: No Do you need assistance with eating?: No Do you have difficulty maintaining continence: No Do you need assistance with getting out of bed/getting out of a chair/moving?: No Do you have difficulty managing or taking your medications?: No  Follow up appointments reviewed: PCP Follow-up appointment confirmed?: Yes Date of PCP follow-up appointment?: 12/25/23 Follow-up Provider: [name] Follow-up appointment  confirmed?: Yes Date of Specialist follow-up appointment?: 01/06/24 Follow-Up Specialty Provider:: ortho Do you need transportation to your follow-up appointment?: No Do you understand care options if your condition(s) worsen?: Yes-patient verbalized understanding    SIGNATURE Julian Lemmings, LPN [facility] Nurse Health Advisor Direct Dial [phone]

## 2023-12-24 ENCOUNTER — Encounter: Payer: Self-pay | Admitting: Family Medicine

## 2023-12-24 ENCOUNTER — Ambulatory Visit: Admitting: Family Medicine

## 2023-12-24 VITALS — BP 186/78 | HR 85 | Ht 70.0 in | Wt 202.0 lb

## 2023-12-24 DIAGNOSIS — S72002A Fracture of unspecified part of neck of left femur, initial encounter for closed fracture: Secondary | ICD-10-CM

## 2023-12-24 DIAGNOSIS — Z96642 Presence of left artificial hip joint: Secondary | ICD-10-CM

## 2023-12-24 DIAGNOSIS — E782 Mixed hyperlipidemia: Secondary | ICD-10-CM | POA: Diagnosis not present

## 2023-12-24 DIAGNOSIS — I25119 Atherosclerotic heart disease of native coronary artery with unspecified angina pectoris: Secondary | ICD-10-CM

## 2023-12-24 DIAGNOSIS — I5042 Chronic combined systolic (congestive) and diastolic (congestive) heart failure: Secondary | ICD-10-CM

## 2023-12-24 DIAGNOSIS — N1832 Chronic kidney disease, stage 3b: Secondary | ICD-10-CM

## 2023-12-24 DIAGNOSIS — N179 Acute kidney failure, unspecified: Secondary | ICD-10-CM

## 2023-12-24 DIAGNOSIS — E118 Type 2 diabetes mellitus with unspecified complications: Secondary | ICD-10-CM

## 2023-12-24 MED ORDER — SEMAGLUTIDE(0.25 OR 0.5MG/DOS) 2 MG/3ML ~~LOC~~ SOPN
0.2500 mg | PEN_INJECTOR | SUBCUTANEOUS | 3 refills | Status: DC
Start: 2023-12-24 — End: 2024-01-12

## 2023-12-24 MED ORDER — CLOPIDOGREL BISULFATE 75 MG PO TABS: 75.0000 mg | ORAL_TABLET | Freq: Every evening | ORAL | 1 refills | Status: AC

## 2023-12-24 MED ORDER — CARVEDILOL 3.125 MG PO TABS: 3.1250 mg | ORAL_TABLET | Freq: Two times a day (BID) | ORAL | 1 refills | Status: AC

## 2023-12-24 MED ORDER — ISOSORBIDE DINITRATE 5 MG PO TABS: 5.0000 mg | ORAL_TABLET | Freq: Two times a day (BID) | ORAL | 1 refills | Status: AC

## 2023-12-24 MED ORDER — ATORVASTATIN CALCIUM 80 MG PO TABS
80.0000 mg | ORAL_TABLET | Freq: Every evening | ORAL | 1 refills | Status: AC
Start: 2023-12-24 — End: ?

## 2023-12-24 NOTE — Addendum Note (Signed)
 Addended by: MARYANNE CHEW on: 12/24/2023 04:15 PM   Modules accepted: Orders
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
## 2023-12-25 ENCOUNTER — Ambulatory Visit

## 2023-12-25 LAB — CBC WITH DIFFERENTIAL/PLATELET
Basophils Absolute: 0 x10E3/uL (ref 0.0–0.2)
Basos: 1 %
EOS (ABSOLUTE): 0.2 x10E3/uL (ref 0.0–0.4)
Eos: 2 %
Hematocrit: 35.7 % — ABNORMAL LOW (ref 37.5–51.0)
Hemoglobin: 11.1 g/dL — ABNORMAL LOW (ref 13.0–17.7)
Immature Grans (Abs): 0 x10E3/uL (ref 0.0–0.1)
Immature Granulocytes: 0 %
Lymphocytes Absolute: 2.4 x10E3/uL (ref 0.7–3.1)
Lymphs: 29 %
MCH: 26.9 pg (ref 26.6–33.0)
MCHC: 31.1 g/dL — ABNORMAL LOW (ref 31.5–35.7)
MCV: 86 fL (ref 79–97)
Monocytes Absolute: 0.8 x10E3/uL (ref 0.1–0.9)
Monocytes: 10 %
Neutrophils Absolute: 4.7 x10E3/uL (ref 1.4–7.0)
Neutrophils: 58 %
Platelets: 428 x10E3/uL (ref 150–450)
RBC: 4.13 x10E6/uL — ABNORMAL LOW (ref 4.14–5.80)
RDW: 13.6 % (ref 11.6–15.4)
WBC: 8.2 x10E3/uL (ref 3.4–10.8)

## 2023-12-25 LAB — CMP14+EGFR
ALT: 11 IU/L (ref 0–44)
AST: 14 IU/L (ref 0–40)
Albumin: 3.3 g/dL — AB (ref 3.8–4.9)
Alkaline Phosphatase: 189 IU/L — AB (ref 44–121)
BUN/Creatinine Ratio: 12 (ref 9–20)
BUN: 23 mg/dL (ref 6–24)
Bilirubin Total: 0.2 mg/dL (ref 0.0–1.2)
CO2: 23 mmol/L (ref 20–29)
Calcium: 8.8 mg/dL (ref 8.7–10.2)
Chloride: 106 mmol/L (ref 96–106)
Creatinine, Ser: 1.91 mg/dL — AB (ref 0.76–1.27)
Globulin, Total: 3.1 g/dL (ref 1.5–4.5)
Glucose: 242 mg/dL — AB (ref 70–99)
Potassium: 5.5 mmol/L — AB (ref 3.5–5.2)
Sodium: 142 mmol/L (ref 134–144)
Total Protein: 6.4 g/dL (ref 6.0–8.5)
eGFR: 40 mL/min/1.73 — AB (ref >59)

## 2023-12-29 ENCOUNTER — Encounter: Payer: Self-pay | Admitting: Cardiovascular Disease

## 2023-12-29 ENCOUNTER — Ambulatory Visit: Attending: Cardiovascular Disease | Admitting: Cardiovascular Disease

## 2023-12-29 VITALS — BP 148/78 | HR 81 | Ht 70.0 in | Wt 195.6 lb

## 2023-12-29 DIAGNOSIS — I10 Essential (primary) hypertension: Secondary | ICD-10-CM | POA: Insufficient documentation

## 2023-12-29 DIAGNOSIS — I420 Dilated cardiomyopathy: Secondary | ICD-10-CM | POA: Insufficient documentation

## 2023-12-29 DIAGNOSIS — E785 Hyperlipidemia, unspecified: Secondary | ICD-10-CM | POA: Insufficient documentation

## 2023-12-29 DIAGNOSIS — Z95810 Presence of automatic (implantable) cardiac defibrillator: Secondary | ICD-10-CM | POA: Insufficient documentation

## 2023-12-29 DIAGNOSIS — Z955 Presence of coronary angioplasty implant and graft: Secondary | ICD-10-CM | POA: Diagnosis not present

## 2023-12-29 DIAGNOSIS — I6523 Occlusion and stenosis of bilateral carotid arteries: Secondary | ICD-10-CM | POA: Diagnosis present

## 2023-12-29 NOTE — Assessment & Plan Note (Signed)
 History of dilated cardiomyopathy with an EF in the 30% range which has been fairly chronic.  GDMT has been limited by renal insufficiency.  He is on carvedilol .  He has minimal symptoms of heart failure.  I am going to refer him to the advanced heart failure clinic for medication optimization.

## 2023-12-29 NOTE — Assessment & Plan Note (Signed)
 History of essential hypertension with blood pressure measured today at 148/78.  He says that at home it is much lower than this.  He is on carvedilol .

## 2023-12-29 NOTE — Assessment & Plan Note (Signed)
 Implantable cardiac defibrillator by Dr. In the setting of VF arrest with diminished LV function.  I am going to arrange for him to see EP back in follow-up.

## 2023-12-29 NOTE — Assessment & Plan Note (Signed)
 History of CAD status post stenting many years ago by Dr. Grover when he was 58 years old.  He had a STEMI 05/2016 with DES to LM to complicated by VT/VF arrest.  Relook 1 month later showed stenosis of his circumflex with probable edge dissection with restenting.  He had non-STEMI 9/21 and ultimately underwent CABG x 5.  He LIMA to his LAD, sequential SVG to the PDA and right PLA, left radial to OM and RIMA to first diagonal branch.  He currently denies chest pain.

## 2023-12-29 NOTE — Assessment & Plan Note (Signed)
 History of hyperlipidemia recently started on high-dose statin therapy with lipid profile performed during his recent hospitalization 12/03/2023 revealing a total cholesterol 430, LDL 259 and HDL of 31.  Will recheck a lipid liver profile in 3 months.  I suspect ultimately he will need to be on a PCSK9.

## 2023-12-29 NOTE — Assessment & Plan Note (Signed)
 History of carotid artery disease status post right carotid endarterectomy in 2010 known subsequent occlusion.  Carotid Dopplers performed 8//25 showed moderate left ICA stenosis.  This will be repeated on an annual basis.

## 2023-12-29 NOTE — Patient Instructions (Signed)
 Medication Instructions:  Your physician recommends that you continue on your current medications as directed. Please refer to the Current Medication list given to you today.  *If you need a refill on your cardiac medications before your next appointment, please call your pharmacy*  Lab Work: Your physician recommends that you return for lab work in: 3 months for FASTING lipid/liver panel  If you have labs (blood work) drawn today and your tests are completely normal, you will receive your results only by: MyChart Message (if you have MyChart) OR A paper copy in the mail If you have any lab test that is abnormal or we need to change your treatment, we will call you to review the results.  Testing/Procedures: Your physician has requested that you have a carotid duplex. This test is an ultrasound of the carotid arteries in your neck. It looks at blood flow through these arteries that supply the brain with blood. Allow one hour for this exam. There are no restrictions or special instructions. This will take place at [address] 4th floor **To do in August 2026**  Please note: We ask at that you not bring children with you during ultrasound (echo/ vascular) testing. Due to room size and safety concerns, children are not allowed in the ultrasound rooms during exams. Our front office staff cannot provide observation of children in our lobby area while testing is being conducted. An adult accompanying a patient to their appointment will only be allowed in the ultrasound room at the discretion of the ultrasound technician under special circumstances. We apologize for any inconvenience.   Follow-Up: At [facility], you and your health needs are our priority.  As part of our continuing mission to provide you with exceptional heart care, our providers are all part of one team.  This team includes your primary Cardiologist (physician) and Advanced Practice Providers or APPs (Physician  Assistants and Nurse Practitioners) who all work together to provide you with the care you need, when you need it.  Your next appointment:   3 month(s)  Provider:   Callie Goodrich, PA-C         Then, Dorn Lesches, MD will plan to see you again in 12 month(s).     We recommend signing up for the patient portal called MyChart.  Sign up information is provided on this After Visit Summary.  MyChart is used to connect with patients for Virtual Visits (Telemedicine).  Patients are able to view lab/test results, encounter notes, upcoming appointments, etc.  Non-urgent messages can be sent to your provider as well.   To learn more about what you can do with MyChart, go to [email or web address].

## 2023-12-29 NOTE — Progress Notes (Signed)
 12/29/2023 Douglas Carr   06-03-1965  [id number]  Primary Physician Douglas Carr, Douglas Carr LABOR, MD Primary Cardiologist: Douglas Carr Douglas Lesches MD [place], [place], [place]  HPI:  Douglas Carr is a 58 y.o.   mildly overweight divorced Caucasian male father of 3, and father of one grandchild who I las spoke to for virtual office visit 07/12/2019.  He is accompanied by one of his daughters Douglas Carr today.SABRA  He did have a myocardial infarction at age 86 and underwent cardiac catheterization by Dr. Maye revealing occluded vessel with collaterals. Medical therapy was recommended. I apparently cathed him in 2010 without significant findings. The symptoms include treated hypertension, hyperlipidemia and diabetes. He worked as a Merchandiser, retail for [employer]. He did have a remote right carotid endarterectomy performed approximately 10 years ago. Carotid Doppler performed recently on 06/09/16 showed acute thrombus of the origin of the right common carotid artery throughout the visualized portion of the ICA.   Douglas Carr had a STEMI on 05/07/16 and had a Promus struggling stent placed by Dr. Swaziland in the second obtuse marginal branch. He was discharged home 4 days later and readmitted the following day with volume overload. EF was 30%. He was diuresed and sent home after that. He was readmitted on 05/24/16 with a witnessed cardiac arrest and bystander CPR performed by history 3 year old son. He had fairly rapid R OSC and underwent emergency cardiac catheterization by Dr. Anner revealing a 90% stenosis at the origin of the previous stented vessel with 70% diffuse proximal AV groove circumflex stenosis which was stented by Dr. Anner. He had a prolonged hospital course with intubation sedation and systemic hypothermia. He was seen by palliative care. Ultimately he regained consciousness and neurologic function. He has been in rehabilitation since and is alert, oriented, conversant and ambulatory.  He underwent ICD implantation by Dr. Inocencio on 06/30/16 for secondary prevention.   Since I s spoke to him 4 years ago he did subsequently underwent CABG x 5 9/21 with a LIMA to his LAD, sequential vein to the PDA and right PLA, left radial to an OM and RIMA to diagonal branch.  He has ischemic cardiomyopathy with an EF that fairly chronically in the 30% range.  He recently fell and fractured his hip and underwent a hip replacement at [facility].  This was uncomplicated.  Echo during hospitalization showed EF of 30%, occluded right carotid and moderate left ICA stenosis.  He is currently getting OT and PT.  He lives alone.  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  He has been off his medications until recently.   Current Meds  Medication Sig   aspirin  EC (ASPIRIN  LOW DOSE) 81 MG tablet Take 1 tablet (81 mg total) by mouth daily. Swallow whole.   atorvastatin  (LIPITOR ) 80 MG tablet Take 1 tablet (80 mg total) by mouth every evening.   carvedilol  (COREG ) 3.125 MG tablet Take 1 tablet (3.125 mg total) by mouth every 12 (twelve) hours.   clopidogrel  (PLAVIX ) 75 MG tablet Take 1 tablet (75 mg total) by mouth every evening.   ibuprofen (ADVIL) 200 MG tablet Take 400 mg by mouth 2 (two) times daily as needed for headache or moderate pain (pain score 4-6).   isosorbide  dinitrate (ISORDIL ) 5 MG tablet Take 1 tablet (5 mg total) by mouth 2 (two) times daily. Please contact the office to schedule appointment for additional refills. 1st Attempt.  methocarbamol  (ROBAXIN ) 500 MG tablet Take 1 tablet (500 mg total) by mouth every 6 (six) hours as needed for muscle spasms.   mirtazapine  (REMERON ) 30 MG tablet Take 30 mg by mouth at bedtime.   Multiple Vitamin (MULTIVITAMIN WITH MINERALS) TABS tablet Take 1 tablet by mouth daily.   oxyCODONE  (ROXICODONE ) 5 MG immediate release tablet Take 1 tablet (5 mg total) by mouth every 8 (eight) hours as needed for severe  pain (pain score 7-10).   polyethylene glycol (MIRALAX  / GLYCOLAX ) 17 g packet Take 17 g by mouth 2 (two) times daily.   Semaglutide ,0.25 or 0.5MG /DOS, 2 MG/3ML SOPN Inject 0.25 mg into the skin once a week.     No Known Allergies  Social History   Socioeconomic History   Marital status: Married    Spouse name: Douglas Carr   Number of children: 3   Years of education: Not on file   Highest education level: Not on file  Occupational History   Occupation: Midwife for [employer]    Employer: [employer]   Tobacco Use   Smoking status: Former    Current packs/day: 0.00    Average packs/day: 2.0 packs/day for 22.0 years (44.0 ttl pk-yrs)    Types: Cigarettes    Start date: 10/22/1989    Quit date: 10/23/2011    Years since quitting: 12.1   Smokeless tobacco: Current    Types: Snuff  Vaping Use   Vaping status: Never Used  Substance and Sexual Activity   Alcohol use: No   Drug use: No   Sexual activity: Not on file  Other Topics Concern   Not on file  Social History Narrative   Pt lives with family in [place], [place].   Social Drivers of Corporate investment banker Strain: Low Risk  (07/24/2021)   Overall Financial Resource Strain (CARDIA)    Difficulty of Paying Living Expenses: Not hard at all  Food Insecurity: No Food Insecurity (12/04/2023)   Hunger Vital Sign    Worried About Running Out of Food in the Last Year: Never true    Ran Out of Food in the Last Year: Never true  Transportation Needs: No Transportation Needs (12/04/2023)   PRAPARE - Administrator, Civil Service (Medical): No    Lack of Transportation (Non-Medical): No  Physical Activity: Sufficiently Active (07/24/2021)   Exercise Vital Sign    Days of Exercise per Week: 5 days    Minutes of Exercise per Session: 30 min  Stress: No Stress Concern Present (07/24/2021)   [employer] of Occupational Health - Occupational Stress Questionnaire    Feeling of Stress : Not at all  Social Connections: Moderately  Integrated (07/24/2021)   Social Connection and Isolation Panel    Frequency of Communication with Friends and Family: More than three times a week    Frequency of Social Gatherings with Friends and Family: More than three times a week    Attends Religious Services: 1 to 4 times per year    Active Member of [employer] or Organizations: No    Attends Banker Meetings: Never    Marital Status: Married  Catering manager Violence: Not At Risk (12/04/2023)   Humiliation, Afraid, Rape, and Kick questionnaire    Fear of Current or Ex-Partner: No    Emotionally Abused: No    Physically Abused: No    Sexually Abused: No     Review of Systems: General: negative for chills, fever, night sweats or weight changes.  Cardiovascular: negative for chest pain, dyspnea on exertion, edema, orthopnea, palpitations, paroxysmal nocturnal dyspnea or shortness of breath Dermatological: negative for rash Respiratory: negative for cough or wheezing Urologic: negative for hematuria Abdominal: negative for nausea, vomiting, diarrhea, bright red blood per rectum, melena, or hematemesis Neurologic: negative for visual changes, syncope, or dizziness All other systems reviewed and are otherwise negative except as noted above.    Blood pressure (!) 148/78, pulse 81, height 5' 10 (1.778 m), weight 195 lb 9.6 oz (88.7 kg), SpO2 99%.  General appearance: alert and no distress Neck: no adenopathy, no carotid bruit, no JVD, supple, symmetrical, trachea midline, and thyroid not enlarged, symmetric, no tenderness/mass/nodules Lungs: clear to auscultation bilaterally Heart: regular rate and rhythm, S1, S2 normal, no murmur, click, rub or gallop Extremities: extremities normal, atraumatic, no cyanosis or edema Pulses: 2+ and symmetric Skin: Skin color, texture, turgor normal. No rashes or lesions Neurologic: Grossly normal  EKG not performed today      ASSESSMENT AND PLAN:   Essential hypertension History of  essential hypertension with blood pressure measured today at 148/78.  He says that at home it is much lower than this.  He is on carvedilol .  Status post coronary artery stent placement History of CAD status post stenting many years ago by Dr. Grover when he was 58 years old.  He had a STEMI 05/2016 with DES to LM to complicated by VT/VF arrest.  Relook 1 month later showed stenosis of his circumflex with probable edge dissection with restenting.  He had non-STEMI 9/21 and ultimately underwent CABG x 5.  He LIMA to his LAD, sequential SVG to the PDA and right PLA, left radial to OM and RIMA to first diagonal branch.  He currently denies chest pain.  Dilated cardiomyopathy (HCC) History of dilated cardiomyopathy with an EF in the 30% range which has been fairly chronic.  GDMT has been limited by renal insufficiency.  He is on carvedilol .  He has minimal symptoms of heart failure.  I am going to refer him to the advanced heart failure clinic for medication optimization.  Dyslipidemia, goal LDL below 70 History of hyperlipidemia recently started on high-dose statin therapy with lipid profile performed during his recent hospitalization 12/03/2023 revealing a total cholesterol 430, LDL 259 and HDL of 31.  Will recheck a lipid liver profile in 3 months.  I suspect ultimately he will need to be on a PCSK9.  ICD (implantable cardioverter-defibrillator) in place Implantable cardiac defibrillator by Dr. In the setting of VF arrest with diminished LV function.  I am going to arrange for him to see EP back in follow-up.  Bilateral carotid artery disease (HCC) History of carotid artery disease status post right carotid endarterectomy in 2010 known subsequent occlusion.  Carotid Dopplers performed 8//25 showed moderate left ICA stenosis.  This will be repeated on an annual basis.     Douglas Carr DOROTHA Lesches MD FACP,FACC,FAHA, [facility] 12/29/2023 2:39 PM

## 2023-12-30 ENCOUNTER — Telehealth (INDEPENDENT_AMBULATORY_CARE_PROVIDER_SITE_OTHER): Payer: Self-pay | Admitting: Pharmacist

## 2023-12-30 DIAGNOSIS — E1169 Type 2 diabetes mellitus with other specified complication: Secondary | ICD-10-CM

## 2023-12-30 DIAGNOSIS — Z794 Long term (current) use of insulin: Secondary | ICD-10-CM

## 2023-12-30 MED ORDER — BASAGLAR KWIKPEN 100 UNIT/ML ~~LOC~~ SOPN: 12.0000 [IU] | PEN_INJECTOR | Freq: Every evening | SUBCUTANEOUS | 5 refills | Status: AC

## 2023-12-30 MED ORDER — ACCU-CHEK GUIDE TEST VI STRP: ORAL_STRIP | 12 refills | Status: DC

## 2023-12-30 MED ORDER — ACCU-CHEK GUIDE W/DEVICE KIT: PACK | 1 refills | Status: AC

## 2023-12-30 MED ORDER — ACCU-CHEK SOFTCLIX LANCETS MISC: 5 refills | Status: AC

## 2023-12-30 NOTE — Telephone Encounter (Signed)
 12/30/2023 Name: Douglas Carr MRN: [id number] DOB: Aug 08, 1965  Chief Complaint  Patient presents with   Medication Management    Douglas Carr is a 58 y.o. year old male who presented for a telephone visit.   They were referred to the pharmacist by their PCP for assistance in managing diabetes and medication access.    Subjective:  Care Team: Primary Care Provider: [name], [name], MD ;  Medication Access/Adherence  Current Pharmacy:  [facility] [phone] - [place], [place] - [address] [place] [address] Phone: [phone] Fax: [phone]  Patient reports affordability concerns with their medications: No  Patient reports access/transportation concerns to their pharmacy: No  Patient reports adherence concerns with their medications:  No    Diabetes:  Current medications: just taking Ozempic , reports; needs to restart Insulin  glargine 12 units daily Medications tried in the past: Novolog   No current meter; went without medications after jail Current glucose readings: unable to check  Current meal patterns:  Discussed meal planning options and Plate method for healthy eating Avoid sugary drinks and desserts Incorporate balanced protein, non starchy veggies, 1 serving of carbohydrate with each meal Increase water  intake Increase physical activity as able  Current medication access support: medicaid//care  Hyperlipidemia -goal LDL <70, current LDL 259 -taking atorvastatin  80mg  -likely require PCSK9   Objective:  Lab Results  Component Value Date   HGBA1C 13.4 (H) 12/05/2023    Lab Results  Component Value Date   CREATININE 1.91 (H) 12/24/2023   BUN 23 12/24/2023   NA 142 12/24/2023   K 5.5 (H) 12/24/2023   CL 106 12/24/2023   CO2 23 12/24/2023    Lab Results  Component Value Date   CHOL 430 (H) 12/03/2023   HDL 31 (L) 12/03/2023   LDLCALC 259 (H) 12/03/2023   TRIG 572 (HH) 12/03/2023   CHOLHDL  13.9 (H) 12/03/2023    Medications Reviewed Today     Reviewed by Billee Mliss BIRCH, [facility] (Pharmacist) on 12/30/23 at 1310  Med List Status: <None>   Medication Order Taking? Sig Documenting Provider Last Dose Status Informant  Accu-Chek Softclix Lancets lancets [id number] Yes Use as directed to check glucose 3x/day. DX:11.65 [name], [name], MD  Active   aspirin  EC (ASPIRIN  LOW DOSE) 81 MG tablet [id number]  Take 1 tablet (81 mg total) by mouth daily. Swallow whole. [name], [name], MD  Active Self, Pharmacy Records  atorvastatin  (LIPITOR ) 80 MG tablet [id number]  Take 1 tablet (80 mg total) by mouth every evening. [name], [name], MD  Active   Blood Glucose Monitoring Suppl (ACCU-CHEK GUIDE) w/Device KIT [id number] Yes Use as directed to check glucose. DX: E11.65 [name], [name], MD  Active   carvedilol  (COREG ) 3.125 MG tablet [id number]  Take 1 tablet (3.125 mg total) by mouth every 12 (twelve) hours. [name], [name], MD  Active   clopidogrel  (PLAVIX ) 75 MG tablet [id number]  Take 1 tablet (75 mg total) by mouth every evening. [name], [name], MD  Active   glucose blood (ACCU-CHEK GUIDE TEST) test strip [id number] Yes Use as instructed to check blood sugar 3 times daily; patient on insulin ; DX:11.65 [name], Joshua A, MD  Active   ibuprofen (ADVIL) 200 MG tablet [id number]  Take 400 mg by mouth 2 (two) times daily as needed for headache or moderate pain (pain score 4-6). [provider]  Active Self, Pharmacy Records  Insulin  Glargine (BASAGLAR  [facility]) 100 UNIT/ML [id number] Yes Inject 12 Units  into the skin at bedtime. Inject up to 50 units subcutaneously daily per provider guidance [name], [name], MD  Active   isosorbide  dinitrate (ISORDIL ) 5 MG tablet [id number]  Take 1 tablet (5 mg total) by mouth 2 (two) times daily. Please contact the office to schedule appointment for additional refills. 1st Attempt. [name], [name], MD  Active   methocarbamol  (ROBAXIN ) 500  MG tablet [id number]  Take 1 tablet (500 mg total) by mouth every 6 (six) hours as needed for muscle spasms. Rashid, Farhan, MD  Active   mirtazapine  (REMERON ) 30 MG tablet [id number]  Take 30 mg by mouth at bedtime. [provider]  Active Self, Pharmacy Records  Multiple Vitamin (MULTIVITAMIN WITH MINERALS) TABS tablet [id number]  Take 1 tablet by mouth daily. Dino Antu, MD  Active   oxyCODONE  (ROXICODONE ) 5 MG immediate release tablet [id number]  Take 1 tablet (5 mg total) by mouth every 8 (eight) hours as needed for severe pain (pain score 7-10). Rashid, Farhan, MD  Active   polyethylene glycol (MIRALAX  / GLYCOLAX ) 17 g packet [id number]  Take 17 g by mouth 2 (two) times daily. Dino Antu, MD  Active   Semaglutide ,0.25 or 0.5MG /DOS, 2 MG/3ML NELMA [id number]  Inject 0.25 mg into the skin once a week. [name], [name], MD  Active               Assessment/Plan:   Diabetes: - Currently uncontrolled - Reviewed long term cardiovascular and renal outcomes of uncontrolled blood sugar - Reviewed goal A1c, goal fasting, and goal 2 hour post prandial glucose - Recommend to : Continue Ozempic  0.25mg  weekly x4 weeks (just started the 0.25mg  weekly dose this week) START Basaglar  basal insulin  12 units daily (hasn't picked up yet) START Accuchek GUIDE glucometer Offered CGM, however patient would like to wait  - Patient denies personal or family history of multiple endocrine neoplasia type 2, medullary thyroid cancer; personal history of pancreatitis or gallbladder disease.  Hyperlipidemia -patient filled and picked up statin -denies side effects -LDL is 259, will likely need PCSK9 given LDL significantly above goal    Follow Up Plan: next week  Mliss Tarry Griffin, PharmD, BCACP, CPP Clinical Pharmacist, [facility]

## 2023-12-31 ENCOUNTER — Ambulatory Visit: Payer: Self-pay | Admitting: Family Medicine

## 2023-12-31 ENCOUNTER — Other Ambulatory Visit

## 2023-12-31 ENCOUNTER — Ambulatory Visit

## 2024-01-04 NOTE — Progress Notes (Unsigned)
 01/05/2024 Name: Douglas Carr MRN: [id number] DOB: 06/17/1965  Chief Complaint  Patient presents with   Diabetes   Hyperlipidemia    Douglas Carr is a 58 y.o. year old male who presented for a telephone visit.   They were referred to the pharmacist by their PCP for assistance in managing diabetes and medication access.    Subjective: Patient picked up his prescription for Basaglar  and his atorvastatin  on 12/24/23. He did not pick-up his AccuChek Meter from the pharmacy yet, but he was able to find his old meter at home during the call. He is interested in a CGM monitor, and is willing to come into the office to have a sample placed.   Patient does not have a blood pressure cuff at home, but is open to getting one in the future. He had PT come to his home today, and his blood pressure during the visit was 134/60.  Care Team: Primary Care Provider: [name], [name], MD; next visit: 03/10/2024  Medication Access/Adherence  [facility] Pharmacy:  [phone] [place] [place] - [address] [place] [address] Phone: [phone] Fax: [phone]  Patient reports affordability concerns with their medications: No  Patient reports access/transportation concerns to their pharmacy: No  Patient reports adherence concerns with their medications:  No    Diabetes: Current medications: Ozempic  0.25 mg weekly (every Friday), Basaglar  12 units daily Medications tried in the past: Novolog  Statin: atorvastatin  80 mg daily LDL of 259 on 12/03/23, will likely benefit from additional lipid therapy (PCSK9i) ACEi/ARB: N/A, previously on Entresto  in 2023 UACR of 4,830 on 12/03/23, will likely benefit from a RAAS agent GFR 34 mL/min  A1c of 13.4% on 12/05/23, increased from 10.5% on 05/26/2021  Found his old glucometer while on the phone. Current glucose readings:  He has not been checking his blood sugars, but he checked it during the call and it  was 216 mg/dL (ate 30 minutes ago)   Patient denies hypoglycemic s/sx including dizziness, shakiness, sweating. Patient denies hyperglycemic symptoms including polyuria, polydipsia, polyphagia, nocturia, neuropathy, blurred vision.  Current meal patterns:  Discussed meal planning options and plate method for healthy eating Avoid sugary drinks and desserts Incorporate balanced protein, non starchy veggies, 1 serving of carbohydrate with each meal Increase water  intake Increase physical activity as able  Current medication access support: medicaid//care  Objective: Lab Results  Component Value Date   HGBA1C 13.4 (H) 12/05/2023   Lab Results  Component Value Date   CREATININE 1.91 (H) 12/24/2023   BUN 23 12/24/2023   NA 142 12/24/2023   K 5.5 (H) 12/24/2023   CL 106 12/24/2023   CO2 23 12/24/2023   Lab Results  Component Value Date   CHOL 430 (H) 12/03/2023   HDL 31 (L) 12/03/2023   LDLCALC 259 (H) 12/03/2023   TRIG 572 (HH) 12/03/2023   CHOLHDL 13.9 (H) 12/03/2023   Medications Reviewed Today     Reviewed by Bernette Falling, RPH (Pharmacist) on 01/05/24 at 1527  Med List Status: <None>   Medication Order Taking? Sig Documenting Provider Last Dose Status Informant  Accu-Chek Softclix Lancets lancets [id number]  Use as directed to check glucose 3x/day. DX:11.65 [name], [name], MD  Active   aspirin  EC (ASPIRIN  LOW DOSE) 81 MG tablet [id number]  Take 1 tablet (81 mg total) by mouth daily. Swallow whole. [name], [name], MD  Active Self, Pharmacy Records  atorvastatin  (LIPITOR ) 80 MG tablet [id number] Yes Take 1  tablet (80 mg total) by mouth every evening. [name], [name], MD  Active   Blood Glucose Monitoring Suppl (ACCU-CHEK GUIDE) w/Device KIT [id number]  Use as directed to check glucose. DX: E11.65 [name], [name], MD  Active   carvedilol  (COREG ) 3.125 MG tablet [id number]  Take 1 tablet (3.125 mg total) by mouth every 12 (twelve) hours. [name], [name], MD   Active   clopidogrel  (PLAVIX ) 75 MG tablet [id number]  Take 1 tablet (75 mg total) by mouth every evening. [name], [name], MD  Active   glucose blood (ACCU-CHEK GUIDE TEST) test strip [id number]  Use as instructed to check blood sugar 3 times daily; patient on insulin ; DX:11.65 [name], Joshua A, MD  Active   ibuprofen (ADVIL) 200 MG tablet [id number]  Take 400 mg by mouth 2 (two) times daily as needed for headache or moderate pain (pain score 4-6). [provider]  Active Self, Pharmacy Records  Insulin  Glargine (BASAGLAR  [facility]) 100 UNIT/ML [id number] Yes Inject 12 Units into the skin at bedtime. Inject up to 50 units subcutaneously daily per provider guidance [name], [name], MD  Active   isosorbide  dinitrate (ISORDIL ) 5 MG tablet [id number]  Take 1 tablet (5 mg total) by mouth 2 (two) times daily. Please contact the office to schedule appointment for additional refills. 1st Attempt. [name], [name], MD  Active   methocarbamol  (ROBAXIN ) 500 MG tablet [id number]  Take 1 tablet (500 mg total) by mouth every 6 (six) hours as needed for muscle spasms. Rashid, Farhan, MD  Active   mirtazapine  (REMERON ) 30 MG tablet [id number]  Take 30 mg by mouth at bedtime. [provider]  Active Self, Pharmacy Records  Multiple Vitamin (MULTIVITAMIN WITH MINERALS) TABS tablet [id number]  Take 1 tablet by mouth daily. Dino Antu, MD  Active   oxyCODONE  (ROXICODONE ) 5 MG immediate release tablet [id number]  Take 1 tablet (5 mg total) by mouth every 8 (eight) hours as needed for severe pain (pain score 7-10). Rashid, Farhan, MD  Active   polyethylene glycol (MIRALAX  / GLYCOLAX ) 17 g packet [id number]  Take 17 g by mouth 2 (two) times daily. Dino Antu, MD  Active   Semaglutide ,0.25 or 0.5MG /DOS, 2 MG/3ML NELMA [id number] Yes Inject 0.25 mg into the skin once a week. [name], [name], MD  Active             Assessment/Plan:   Diabetes: Currently uncontrolled,  Goal of  <7% Reviewed long term cardiovascular and renal outcomes of uncontrolled blood sugar Reviewed goal A1c, goal fasting, and goal 2 hour post prandial glucose Recommend to   Continue current diabetic medications Consider dose titration of Ozempic  after 4th dose. Patient took his 2nd dose on 8/29, his fourth dose would be on 9/12 Patient denies personal or family history of multiple endocrine neoplasia type 2, medullary thyroid cancer; personal history of pancreatitis or gallbladder disease. Recommend to check glucose daily.  Hyperlipidemia: Currently uncontrolled LDL goal per cardiology <70, can consider lower goal of <55 considering ASCVD history  Patient filled atorvastatin  on 12/24/23 LDL is 259, will likely need PCSK9 given LDL significantly above goal  Hypertension:  Currently uncontrolled At home visit, BP of 134/60 Considering renal dysfunction and previous UACR, patient may benefit from ACEi/ARB therapy in the future  Follow Up Plan:1 week, in-person for CGM placement and education  Woodie Jock, PharmD PGY1 Pharmacy Resident    Mliss Tarry Griffin, PharmD, BCACP, CPP Clinical Pharmacist, [facility]

## 2024-01-05 ENCOUNTER — Ambulatory Visit: Payer: Medicare PPO | Attending: Family Medicine

## 2024-01-05 ENCOUNTER — Other Ambulatory Visit (INDEPENDENT_AMBULATORY_CARE_PROVIDER_SITE_OTHER): Admitting: Pharmacist

## 2024-01-05 DIAGNOSIS — E782 Mixed hyperlipidemia: Secondary | ICD-10-CM

## 2024-01-05 DIAGNOSIS — E1169 Type 2 diabetes mellitus with other specified complication: Secondary | ICD-10-CM

## 2024-01-05 DIAGNOSIS — Z794 Long term (current) use of insulin: Secondary | ICD-10-CM

## 2024-01-06 ENCOUNTER — Ambulatory Visit (INDEPENDENT_AMBULATORY_CARE_PROVIDER_SITE_OTHER)

## 2024-01-06 DIAGNOSIS — I2510 Atherosclerotic heart disease of native coronary artery without angina pectoris: Secondary | ICD-10-CM

## 2024-01-06 DIAGNOSIS — I5042 Chronic combined systolic (congestive) and diastolic (congestive) heart failure: Secondary | ICD-10-CM

## 2024-01-06 DIAGNOSIS — I083 Combined rheumatic disorders of mitral, aortic and tricuspid valves: Secondary | ICD-10-CM

## 2024-01-06 DIAGNOSIS — S72002D Fracture of unspecified part of neck of left femur, subsequent encounter for closed fracture with routine healing: Secondary | ICD-10-CM

## 2024-01-06 DIAGNOSIS — N1831 Chronic kidney disease, stage 3a: Secondary | ICD-10-CM

## 2024-01-06 DIAGNOSIS — E669 Obesity, unspecified: Secondary | ICD-10-CM

## 2024-01-06 DIAGNOSIS — I130 Hypertensive heart and chronic kidney disease with heart failure and stage 1 through stage 4 chronic kidney disease, or unspecified chronic kidney disease: Secondary | ICD-10-CM | POA: Diagnosis not present

## 2024-01-06 DIAGNOSIS — N179 Acute kidney failure, unspecified: Secondary | ICD-10-CM

## 2024-01-06 DIAGNOSIS — I252 Old myocardial infarction: Secondary | ICD-10-CM

## 2024-01-06 DIAGNOSIS — E1169 Type 2 diabetes mellitus with other specified complication: Secondary | ICD-10-CM

## 2024-01-06 DIAGNOSIS — E1122 Type 2 diabetes mellitus with diabetic chronic kidney disease: Secondary | ICD-10-CM | POA: Diagnosis not present

## 2024-01-06 DIAGNOSIS — E1151 Type 2 diabetes mellitus with diabetic peripheral angiopathy without gangrene: Secondary | ICD-10-CM

## 2024-01-11 NOTE — Progress Notes (Unsigned)
 01/12/2024 Name: Douglas Carr MRN: [id number] DOB: 28-Jul-1965  Chief Complaint  Patient presents with   Diabetes    Douglas Carr is Carr 58 y.o. year old male who presented for Carr telephone visit.   They were referred to the pharmacist by their PCP for assistance in managing diabetes and medication access.    Subjective: Patient presents today to received Carr CGM sample and placement. He is doing well with the Ozempic . No side effects reported. He is taking 12 units of basaglar  with no issues.   Care Team: Primary Care Provider: [name], Carr LABOR, Carr; next visit: 03/10/2024  Medication Access/Adherence  [facility]  [phone] [place] - [address] [place] [address] Phone: [phone] Fax: [phone]  Patient reports affordability concerns with their medications: No  Patient reports access/transportation concerns to their pharmacy: No  Patient reports adherence concerns with their medications:  No    Diabetes: Current medications: Ozempic  0.25 mg weekly (every Friday), Basaglar  12 units daily Medications tried in the past: Novolog  Statin: atorvastatin  80 mg daily LDL of 259 on 12/03/23, will likely benefit from additional lipid therapy (PCSK9i) ACEi/ARB: N/Carr, previously on Entresto  in 2023 UACR of 4,830 on 12/03/23, will likely benefit from Carr RAAS agent GFR 34 mL/min  A1c of 13.4% on 12/05/23, increased from 10.5% on 05/26/2021  Current glucose readings:  Fasting sugars: 100-120 mg/dL   Patient denies hypoglycemic s/sx including dizziness, shakiness, sweating. Patient denies  hyperglycemic symptoms including polyuria, polydipsia, polyphagia, nocturia, neuropathy, blurred vision.  Current meal patterns:  Discussed meal planning options and plate method for healthy eating Avoid sugary drinks and desserts Incorporate balanced protein, non starchy veggies, 1 serving of carbohydrate with each meal Increase  water  intake Increase physical activity as able  Current medication access support: Medicaid/Medicare  Objective: Lab Results  Component Value Date   HGBA1C 13.4 (H) 12/05/2023   Lab Results  Component Value Date   CREATININE 1.91 (H) 12/24/2023   BUN 23 12/24/2023   NA 142 12/24/2023   K 5.5 (H) 12/24/2023   CL 106 12/24/2023   CO2 23 12/24/2023   Lab Results  Component Value Date   CHOL 430 (H) 12/03/2023   HDL 31 (L) 12/03/2023   LDLCALC 259 (H) 12/03/2023   TRIG 572 (HH) 12/03/2023   CHOLHDL 13.9 (H) 12/03/2023   Medications Reviewed Today     Reviewed by Douglas Carr (Pharmacist) on 01/12/24 at 1424  Med List Status: <None>   Medication Order Taking? Sig Documenting Provider Last Dose Status Informant  Accu-Chek Softclix Lancets lancets [id number]  Use as directed to check glucose 3x/day. DX:11.65 Douglas Carr  Active   aspirin  EC (ASPIRIN  LOW DOSE) 81 MG tablet [id number]  Take 1 tablet (81 mg total) by mouth daily. Swallow whole. Douglas Carr  Active Self, Pharmacy Records  atorvastatin  (LIPITOR ) 80 MG tablet [id number]  Take 1 tablet (80 mg total) by mouth every evening. Douglas Carr  Active   Blood Glucose Monitoring Suppl (ACCU-CHEK GUIDE) w/Device KIT [id number]  Use as directed to check glucose. DX: E11.65 Douglas Carr  Active   carvedilol  (COREG ) 3.125 MG tablet [id number]  Take 1 tablet (3.125 mg total) by mouth every 12 (twelve) hours. Douglas Carr  Active   clopidogrel  (PLAVIX ) 75 MG tablet [id number]  Take 1 tablet (75 mg total) by mouth every evening. Douglas Carr  Active  glucose blood (ACCU-CHEK GUIDE TEST) test strip [id number]  Use as instructed to check blood sugar 3 times daily; patient on insulin ; DX:11.65 Douglas Carr  Active   ibuprofen (ADVIL) 200 MG tablet [id number]  Take 400 mg by mouth 2 (two) times daily as needed for headache or moderate pain (pain score 4-6).  Provider, Historical, Carr  Active Self, Pharmacy Records  Insulin  Glargine (BASAGLAR  KWIKPEN) 100 UNIT/ML [id number]  Inject 12 Units into the skin at bedtime. Inject up to 50 units subcutaneously daily per provider guidance Douglas Carr  Active   isosorbide  dinitrate (ISORDIL ) 5 MG tablet [id number]  Take 1 tablet (5 mg total) by mouth 2 (two) times daily. Please contact the office to schedule appointment for additional refills. 1st Attempt. Douglas Carr  Active   methocarbamol  (ROBAXIN ) 500 MG tablet [id number]  Take 1 tablet (500 mg total) by mouth every 6 (six) hours as needed for muscle spasms. Douglas Carr  Active   mirtazapine  (REMERON ) 30 MG tablet [id number]  Take 30 mg by mouth at bedtime. Provider, Historical, Carr  Active Self, Pharmacy Records  Multiple Vitamin (MULTIVITAMIN WITH MINERALS) TABS tablet [id number]  Take 1 tablet by mouth daily. Douglas Carr  Active   oxyCODONE  (ROXICODONE ) 5 MG immediate release tablet [id number]  Take 1 tablet (5 mg total) by mouth every 8 (eight) hours as needed for severe pain (pain score 7-10). Douglas Carr  Active   polyethylene glycol (MIRALAX  / GLYCOLAX ) 17 g packet [id number]  Take 17 g by mouth 2 (two) times daily. Douglas Carr  Active   Semaglutide ,0.25 or 0.5MG /DOS, 2 MG/3ML NELMA [id number]  Inject 0.25 mg into the skin once Carr week. Douglas Carr  Active             Assessment/Plan:   Diabetes: Currently uncontrolled,  Goal of <7% Reviewed long term cardiovascular and renal outcomes of uncontrolled blood sugar Reviewed goal A1c, goal fasting, and goal 2 hour post prandial glucose Recommend to   Increase Ozempic  to 0.5 mg weekly on Friday (01/22/24) Completes 4 weeks of 0.25 mg on 09/12, no side effects reported.  Sent in new Rx for Ozempic  0.5 mg weekly to pharmacy Continue Basaglar  12 units daily Place Libre 3+ sensor and set up [employer] and 1 month supply of [place] sensors. Sent in  new Rx for Libre 3+ sensors Patient denies personal or family history of multiple endocrine neoplasia type 2, medullary thyroid cancer; personal history of pancreatitis or gallbladder disease. Recommend to check glucose continuously via Libre 3+ CGM  Hyperlipidemia: Currently uncontrolled LDL goal per cardiology <70, can consider lower goal of <55 considering ASCVD history  Patient filled atorvastatin  on 12/24/23 LDL is 259, will likely need PCSK9 given LDL significantly above goal  Immunization:  Patient due for influenza vaccination Administered influenza vaccine into the left deltoid  Follow Up Plan: PharmD: 3 weeks PCP: 03/10/2024  Woodie Jock, PharmD PGY1 Pharmacy Resident   Mliss Tarry Griffin, PharmD, BCACP, CPP Clinical Pharmacist, [facility]

## 2024-01-12 ENCOUNTER — Ambulatory Visit (INDEPENDENT_AMBULATORY_CARE_PROVIDER_SITE_OTHER): Admitting: Pharmacist

## 2024-01-12 DIAGNOSIS — Z23 Encounter for immunization: Secondary | ICD-10-CM | POA: Diagnosis not present

## 2024-01-12 DIAGNOSIS — E118 Type 2 diabetes mellitus with unspecified complications: Secondary | ICD-10-CM

## 2024-01-12 DIAGNOSIS — I25119 Atherosclerotic heart disease of native coronary artery with unspecified angina pectoris: Secondary | ICD-10-CM

## 2024-01-12 DIAGNOSIS — E1169 Type 2 diabetes mellitus with other specified complication: Secondary | ICD-10-CM

## 2024-01-12 DIAGNOSIS — Z794 Long term (current) use of insulin: Secondary | ICD-10-CM | POA: Diagnosis not present

## 2024-01-13 MED ORDER — FREESTYLE LIBRE 3 PLUS SENSOR MISC: 3 refills | Status: DC

## 2024-01-13 MED ORDER — SEMAGLUTIDE(0.25 OR 0.5MG/DOS) 2 MG/3ML ~~LOC~~ SOPN: 0.5000 mg | PEN_INJECTOR | SUBCUTANEOUS | 0 refills | Status: DC

## 2024-02-01 NOTE — Progress Notes (Deleted)
   02/01/2024 Name: Douglas Carr MRN: [id number] DOB: April 14, 1966  No chief complaint on file.   Douglas Carr is a 58 y.o. year old male who presented for a telephone visit. They were referred to the pharmacist by their PCP for assistance in managing diabetes and medication access.    Subjective: ***  Care Team: Primary Care Provider: [name], [name], MD; next visit: 03/10/2024  Medication Access/Adherence  [facility]  [phone] [place] [place] - [address] [place] [address] Phone: [phone] Fax: [phone]  Patient reports affordability concerns with their medications: No  Patient reports access/transportation concerns to their pharmacy: No  Patient reports adherence concerns with their medications:  No    Diabetes: Current medications: Ozempic  0.5 mg weekly (every Friday), Basaglar  12 units daily Medications tried in the past: Novolog  Statin: atorvastatin  80 mg daily LDL of 259 on 12/03/23, will likely benefit from additional lipid therapy (PCSK9i) ACEi/ARB: N/A, previously on Entresto  in 2023 UACR of 4,830 on 12/03/23, will likely benefit from a RAAS agent GFR 34 mL/min  A1c of 13.4% on 12/05/23, increased from 10.5% on 05/26/2021  Current glucose readings:  Fasting sugars: ***  Libre 3+ Date of Download: *** % Time CGM is active: ***% Average Glucose: *** mg/dL Glucose Management Indicator: ***  Glucose Variability: *** (goal <36%) Time in Goal:  - Time in range 70-180: ***% - Time above range: ***% - Time below range: ***% Observed patterns:  Patient denies hypoglycemic s/sx including dizziness, shakiness, sweating. Patient denies  hyperglycemic symptoms including polyuria, polydipsia, polyphagia, nocturia, neuropathy, blurred vision.  Current meal patterns:  Discussed meal planning options and plate method for healthy eating Avoid sugary drinks and desserts Incorporate balanced protein,  non starchy veggies, 1 serving of carbohydrate with each meal Increase water  intake Increase physical activity as able  Current medication access support: Medicaid/Medicare  Objective: Lab Results  Component Value Date   HGBA1C 13.4 (H) 12/05/2023   Lab Results  Component Value Date   CREATININE 1.91 (H) 12/24/2023   BUN 23 12/24/2023   NA 142 12/24/2023   K 5.5 (H) 12/24/2023   CL 106 12/24/2023   CO2 23 12/24/2023   Lab Results  Component Value Date   CHOL 430 (H) 12/03/2023   HDL 31 (L) 12/03/2023   LDLCALC 259 (H) 12/03/2023   TRIG 572 (HH) 12/03/2023   CHOLHDL 13.9 (H) 12/03/2023   Medications Reviewed Today   Medications were not reviewed in this encounter     Assessment/Plan:  Diabetes: Currently uncontrolled,  Goal of <7% Reviewed long term cardiovascular and renal outcomes of uncontrolled blood sugar Reviewed goal A1c, goal fasting, and goal 2 hour post prandial glucose Recommend to   *** Patient denies personal or family history of multiple endocrine neoplasia type 2, medullary thyroid cancer; personal history of pancreatitis or gallbladder disease. Recommend to check glucose continuously via Libre 3+ CGM  Hyperlipidemia: Currently uncontrolled LDL goal per cardiology <70, can consider lower goal of <55 considering ASCVD history  Patient filled atorvastatin  on 12/24/23 LDL is 259, will likely need PCSK9 given LDL significantly above goal   Follow Up Plan: PharmD: *** PCP: 03/10/2024  ***

## 2024-02-02 ENCOUNTER — Telehealth: Payer: Self-pay

## 2024-02-02 ENCOUNTER — Other Ambulatory Visit

## 2024-02-02 NOTE — Telephone Encounter (Signed)
 Attempted to contact Douglas Carr today in regards to his medication management. Left a HIPAA-complaint voicemail with direct call back number.   Woodie Jock, PharmD PGY1 Pharmacy Resident  02/02/2024

## 2024-02-04 ENCOUNTER — Other Ambulatory Visit: Payer: Self-pay | Admitting: Nephrology

## 2024-02-04 DIAGNOSIS — N1832 Chronic kidney disease, stage 3b: Secondary | ICD-10-CM

## 2024-02-11 ENCOUNTER — Ambulatory Visit: Admitting: Cardiology

## 2024-02-16 ENCOUNTER — Ambulatory Visit
Admission: RE | Admit: 2024-02-16 | Discharge: 2024-02-16 | Disposition: A | Source: Ambulatory Visit | Attending: Nephrology

## 2024-02-16 DIAGNOSIS — N1832 Chronic kidney disease, stage 3b: Secondary | ICD-10-CM

## 2024-02-22 NOTE — Progress Notes (Deleted)
   02/22/2024 Name: Douglas Carr MRN: [id number] DOB: [birth year]  No chief complaint on file.   Douglas Carr is a 58 y.o. year old male who presented for a telephone visit. They were referred to the pharmacist by their PCP for assistance in managing diabetes and medication access.    Subjective: ***  Care Team: Primary Care Provider: [name], [name], MD; next visit: 03/10/2024  Medication Access/Adherence  [phone] [place] - [address] [place] [address] Phone: [phone] Fax: [phone]  Patient reports affordability concerns with their medications: No  Patient reports access/transportation concerns to their pharmacy: No  Patient reports adherence concerns with their medications:  No    Diabetes: Current medications: Ozempic  0.5 mg weekly (every Friday), Basaglar  12 units daily Medications tried in the past: Novolog  Statin: atorvastatin  80 mg daily LDL of 259 on 12/03/23, will likely benefit from additional lipid therapy (PCSK9i) ACEi/ARB: N/A, previously on Entresto  in 2023 UACR of 4,830 on 12/03/23, will likely benefit from a RAAS agent GFR 34 mL/min  A1c of 13.4% on 12/05/23, increased from 10.5% on 05/26/2021  Current glucose readings:  Fasting sugars: ***  Libre 3+ Date of Download: *** % Time CGM is active: ***% Average Glucose: *** mg/dL Glucose Management Indicator: ***  Glucose Variability: *** (goal <36%) Time in Goal:  - Time in range 70-180: ***% - Time above range: ***% - Time below range: ***% Observed patterns:  Patient denies hypoglycemic s/sx including dizziness, shakiness, sweating. Patient denies  hyperglycemic symptoms including polyuria, polydipsia, polyphagia, nocturia, neuropathy, blurred vision.  Current meal patterns:  Discussed meal planning options and plate method for healthy eating Avoid sugary drinks and desserts Incorporate balanced protein,  non starchy veggies, 1 serving of carbohydrate with each meal Increase water  intake Increase physical activity as able  Current medication access support: Medicaid/Medicare  Objective: Lab Results  Component Value Date   HGBA1C 13.4 (H) 12/05/2023   Lab Results  Component Value Date   CREATININE 1.91 (H) 12/24/2023   BUN 23 12/24/2023   NA 142 12/24/2023   K 5.5 (H) 12/24/2023   CL 106 12/24/2023   CO2 23 12/24/2023   Lab Results  Component Value Date   CHOL 430 (H) 12/03/2023   HDL 31 (L) 12/03/2023   LDLCALC 259 (H) 12/03/2023   TRIG 572 (HH) 12/03/2023   CHOLHDL 13.9 (H) 12/03/2023   Medications Reviewed Today   Medications were not reviewed in this encounter     Assessment/Plan:  Diabetes: Currently uncontrolled,  Goal of <7% Reviewed long term cardiovascular and renal outcomes of uncontrolled blood sugar Reviewed goal A1c, goal fasting, and goal 2 hour post prandial glucose Recommend to   *** Patient denies personal or family history of multiple endocrine neoplasia type 2, medullary thyroid cancer; personal history of pancreatitis or gallbladder disease. Recommend to check glucose continuously via Libre 3+ CGM  Hyperlipidemia: Currently uncontrolled LDL goal per cardiology <70, can consider lower goal of <55 considering ASCVD history  Patient filled atorvastatin  on 12/24/23 LDL is 259, will likely need PCSK9 given LDL significantly above goal   Follow Up Plan: PharmD: *** PCP: 03/10/2024  ***

## 2024-02-23 ENCOUNTER — Other Ambulatory Visit

## 2024-02-23 ENCOUNTER — Other Ambulatory Visit (HOSPITAL_COMMUNITY): Payer: Self-pay

## 2024-03-10 ENCOUNTER — Ambulatory Visit (INDEPENDENT_AMBULATORY_CARE_PROVIDER_SITE_OTHER): Payer: Self-pay | Admitting: Family Medicine

## 2024-03-10 ENCOUNTER — Encounter: Payer: Self-pay | Admitting: Family Medicine

## 2024-03-10 VITALS — BP 188/82 | HR 80 | Ht <= 58 in | Wt 194.0 lb

## 2024-03-10 DIAGNOSIS — E1159 Type 2 diabetes mellitus with other circulatory complications: Secondary | ICD-10-CM

## 2024-03-10 DIAGNOSIS — I152 Hypertension secondary to endocrine disorders: Secondary | ICD-10-CM

## 2024-03-10 DIAGNOSIS — I25119 Atherosclerotic heart disease of native coronary artery with unspecified angina pectoris: Secondary | ICD-10-CM

## 2024-03-10 DIAGNOSIS — Z95810 Presence of automatic (implantable) cardiac defibrillator: Secondary | ICD-10-CM

## 2024-03-10 DIAGNOSIS — N183 Chronic kidney disease, stage 3 unspecified: Secondary | ICD-10-CM

## 2024-03-10 DIAGNOSIS — E1169 Type 2 diabetes mellitus with other specified complication: Secondary | ICD-10-CM

## 2024-03-10 DIAGNOSIS — Z794 Long term (current) use of insulin: Secondary | ICD-10-CM

## 2024-03-10 DIAGNOSIS — E785 Hyperlipidemia, unspecified: Secondary | ICD-10-CM

## 2024-03-10 DIAGNOSIS — E119 Type 2 diabetes mellitus without complications: Secondary | ICD-10-CM

## 2024-03-10 DIAGNOSIS — I5042 Chronic combined systolic (congestive) and diastolic (congestive) heart failure: Secondary | ICD-10-CM

## 2024-03-10 DIAGNOSIS — E669 Obesity, unspecified: Secondary | ICD-10-CM

## 2024-03-10 LAB — BAYER DCA HB A1C WAIVED: HB A1C (BAYER DCA - WAIVED): 6.9 % — ABNORMAL HIGH (ref 4.8–5.6)

## 2024-03-10 NOTE — Progress Notes (Signed)
 BP (!) 188/82   Pulse 80   Ht 2' (0.61 m)   Wt 194 lb (88 kg)   SpO2 99%   BMI 236.80 kg/m    Subjective:   Patient ID: Douglas Carr, male    DOB: 17-Mar-1966, 58 y.o.   MRN: [id number]  HPI: Douglas Carr is a 58 y.o. male presenting on 03/10/2024 for Medical Management of Chronic Issues, Diabetes, Hypertension, and Hyperlipidemia   Discussed the use of AI scribe software for clinical note transcription with the patient, who gave verbal consent to proceed.  History of Present Illness   Douglas Carr is a 58 year old male with hypertension and diabetes who presents for blood pressure management and diabetes follow-up.  Hypertension - Blood pressure recorded at 188/82 mmHg during a recent visit - No regular home blood pressure monitoring; does not have a blood pressure machine - Blood pressure readings have been good at nephrology visits in [place] - Currently taking isosorbide  and carvedilol  for blood pressure management; carvedilol  taken at night - No morning antihypertensive medications  Renal function monitoring - History of kidney issues; under care of nephrology - Nephrologist advised ongoing monitoring of kidney function - Kidney function described as not bad but not good  Diabetes mellitus management - Morning blood glucose levels reportedly around 98 mg/dL - Postprandial blood glucose readings around 170 mg/dL - Infrequent self-monitoring of blood glucose; checks prompted by family - No hypoglycemic episodes - Currently on Basaglar  insulin , 12 units at bedtime - Currently on Ozempic ; experiencing financial concerns due to insurance coverage issues          Relevant past medical, surgical, family and social history reviewed and updated as indicated. Interim medical history since our last visit reviewed. Allergies and medications reviewed and updated.  Review of Systems  Constitutional:  Negative for chills and fever.  Respiratory:   Negative for shortness of breath and wheezing.   Cardiovascular:  Negative for chest pain and leg swelling.  Musculoskeletal:  Negative for back pain and gait problem.  Skin:  Negative for rash.  Neurological:  Negative for dizziness and light-headedness.  All other systems reviewed and are negative.   Per HPI unless specifically indicated above   Allergies as of 03/10/2024   No Known Allergies      Medication List        Accurate as of March 10, 2024  3:10 PM. If you have any questions, ask your nurse or doctor.          Accu-Chek Guide Test test strip Generic drug: glucose blood Use as instructed to check blood sugar 3 times daily; patient on insulin ; DX:11.65   Accu-Chek Guide w/Device Kit Use as directed to check glucose. DX: E11.65   Accu-Chek Softclix Lancets lancets Use as directed to check glucose 3x/day. DX:11.65   aspirin  EC 81 MG tablet Commonly known as: Aspirin  Low Dose Take 1 tablet (81 mg total) by mouth daily. Swallow whole.   atorvastatin  80 MG tablet Commonly known as: LIPITOR  Take 1 tablet (80 mg total) by mouth every evening.   Basaglar  KwikPen 100 UNIT/ML Inject 12 Units into the skin at bedtime. Inject up to 50 units subcutaneously daily per provider guidance   carvedilol  3.125 MG tablet Commonly known as: COREG  Take 1 tablet (3.125 mg total) by mouth every 12 (twelve) hours.   clopidogrel  75 MG tablet Commonly known as: PLAVIX  Take 1 tablet (75 mg total) by mouth every evening.   FreeStyle [employer]  3 Plus Sensor Misc Change sensor every 15 days. DX: E11.9   ibuprofen 200 MG tablet Commonly known as: ADVIL Take 400 mg by mouth 2 (two) times daily as needed for headache or moderate pain (pain score 4-6).   isosorbide  dinitrate 5 MG tablet Commonly known as: ISORDIL  Take 1 tablet (5 mg total) by mouth 2 (two) times daily. Please contact the office to schedule appointment for additional refills. 1st Attempt.   methocarbamol  500 MG  tablet Commonly known as: ROBAXIN  Take 1 tablet (500 mg total) by mouth every 6 (six) hours as needed for muscle spasms.   mirtazapine  30 MG tablet Commonly known as: REMERON  Take 30 mg by mouth at bedtime.   multivitamin with minerals Tabs tablet Take 1 tablet by mouth daily.   oxyCODONE  5 MG immediate release tablet Commonly known as: Roxicodone  Take 1 tablet (5 mg total) by mouth every 8 (eight) hours as needed for severe pain (pain score 7-10).   polyethylene glycol 17 g packet Commonly known as: MIRALAX  / GLYCOLAX  Take 17 g by mouth 2 (two) times daily.   Semaglutide (0.25 or 0.5MG /DOS) 2 MG/3ML Sopn Inject 0.5 mg into the skin once a week.         Objective:   BP (!) 188/82   Pulse 80   Ht 2' (0.61 m)   Wt 194 lb (88 kg)   SpO2 99%   BMI 236.80 kg/m   Wt Readings from Last 3 Encounters:  03/10/24 194 lb (88 kg)  12/29/23 195 lb 9.6 oz (88.7 kg)  12/24/23 202 lb (91.6 kg)    Physical Exam Physical Exam   NECK: No cervical lymphadenopathy. CHEST: Lungs clear to auscultation bilaterally. CARDIOVASCULAR: Heart regular rate and rhythm, no murmurs. Peripheral pulses intact. EXTREMITIES: No edema in extremities.         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 Current medications include isosorbide  and carvedilol . - Rechecked blood pressure before leaving. - Encouraged obtaining home blood pressure monitor for daily use. - Continue isosorbide  and carvedilol .  Type 2 diabetes mellitus Blood glucose generally well-controlled. No hypoglycemic episodes. Current medications include Basaglar  insulin  and Ozempic . Potential insurance issues with Ozempic . - Continue Basaglar  insulin  at 12 units at bedtime. - Continue Ozempic  if insurance allows; explore alternatives if needed. - Monitor blood glucose regularly.  Chronic kidney disease, stage 3 Stable status, monitored by nephrologist to prevent progression. - Continue monitoring kidney function per nephrologist's recommendations.  A1c is 6.9 and looks good.  Saw cardiology on 12/29/2023 and blood pressure was 148/78 which was better  Follow up plan: Return in about 3 months (around 06/10/2024), or if symptoms worsen or fail to improve, for Diabetes recheck.  Counseling provided for all of the vaccine components Orders Placed This Encounter  Procedures   Bayer DCA Hb A1c Waived   CMP14+EGFR   TSH   Lipid panel    Fonda Levins, MD [facility] 03/10/2024, 3:10 PM

## 2024-03-11 LAB — CMP14+EGFR
ALT: 16 IU/L (ref 0–44)
AST: 16 IU/L (ref 0–40)
Albumin: 4.1 g/dL (ref 3.8–4.9)
Alkaline Phosphatase: 120 IU/L (ref 47–123)
BUN/Creatinine Ratio: 13 (ref 9–20)
BUN: 23 mg/dL (ref 6–24)
Bilirubin Total: 0.2 mg/dL (ref 0.0–1.2)
CO2: 22 mmol/L (ref 20–29)
Calcium: 9.2 mg/dL (ref 8.7–10.2)
Chloride: 105 mmol/L (ref 96–106)
Creatinine, Ser: 1.74 mg/dL — ABNORMAL HIGH (ref 0.76–1.27)
Globulin, Total: 2.8 g/dL (ref 1.5–4.5)
Glucose: 164 mg/dL — ABNORMAL HIGH (ref 70–99)
Potassium: 5.7 mmol/L — ABNORMAL HIGH (ref 3.5–5.2)
Sodium: 139 mmol/L (ref 134–144)
Total Protein: 6.9 g/dL (ref 6.0–8.5)
eGFR: 45 mL/min/1.73 — ABNORMAL LOW (ref >59)

## 2024-03-11 LAB — LIPID PANEL
Chol/HDL Ratio: 3.6 ratio (ref 0.0–5.0)
Cholesterol, Total: 120 mg/dL (ref 100–199)
HDL: 33 mg/dL — ABNORMAL LOW (ref >39)
LDL Chol Calc (NIH): 63 mg/dL (ref 0–99)
Triglycerides: 136 mg/dL (ref 0–149)
VLDL Cholesterol Cal: 24 mg/dL (ref 5–40)

## 2024-03-11 LAB — TSH: TSH: 2.53 u[IU]/mL (ref 0.450–4.500)

## 2024-03-11 NOTE — Addendum Note (Signed)
 Addended by: LEIGH ROSINA SAILOR on: 03/11/2024 09:25 AM   Modules accepted: Orders

## 2024-03-13 NOTE — Progress Notes (Deleted)
 Cardiology Office Note:    Date:  03/13/2024   ID:  DAVE MERGEN, DOB [birth year], MRN [id number]  PCP:  [name], [name], MD  Cardiologist:  Dorn Lesches, MD Electrophysiologist:  Soyla Gladis Norton, MD { Click to update primary MD,subspecialty MD or APP then REFRESH:1}    Referring MD: [name], [name], MD   Chief Complaint: follow-up of CAD and CHF  History of Present Illness:    Douglas Carr is a 58 y.o. male with a history of CAD s/p STEMI in 05/2016 s/p DES to OM2 with VT/VF arrest later that month and re-look cath showing stenosis of LCX with probable proximal edge dissection with thrombus s/p DES overlapping prior OM2 stent and then more recent NSTEMI in 01/2020 s/p CABG x5. Also has history of ischemic cardiomyopathy/chronic combined CHF with EF of 30% on 01/2020, s/p ACID on 06/2016, bilateral carotid stenosis s/p right CEA in 2010 with subsequent occlusion, hypertension complicated by orthostatic hypotension, hyperlipidemia, type 2 diabetes mellitus, and CKD stage *** who is followed by Dr. Lesches and Dr. Norton and presents today for follow-up of CAD and CHF.   Patient has a long history of CAD. He reportedly had a MI at the age of 49. Cath at that time reportedly showed occluded vessels with collaterals so medical therapy was recommended. Cath in 2010 showed no significant findings. He as admitted with STEMI in 05/2016 with was treated with DES to OM2. Echo at that time showed LVEF of 30%. He was readmitted later that month with VT/VF arrest. ROSC was obtained after about 10 minutes of CPR/ACLS. Emergent cath showed 90% stenosis at the origin of the previous stented vessel with 70% diffuse proximal AV groove of LCX and possible proximal edge dissection with thrombus. He underwent successful PCI with DES overlapping prior stent. He had a prolonged hospital course and ultimately had ICD placed in 06/2016 for secondary prevention of sudden death. He was readmitted with  NSTEMI in 01/2020. Cath showed severe 3 vessel CAD with severely elevated LVEDP of 33mg H consistent with acute on chronic combined CHF. Echo showed LVEF of 30% with global hypokinesis and grade III diastolic dysfunction and mild MR. Patient was diuresed with IV Lasix . CT surgery was consulted and patient underwent CABG x5 (LIMA to LAD, sequential SVG to PDA and right PLA, left radial artery to OM, RIMA to 1st Diag).  He was admitted in 05/2021 for syncope. Echo showed LVEF remained reduced at 30-35%. ICD was interrogated and showed no significant arrhythmias. Orthostatic vitals signs were positive. Imdur  and Flomax  were stopped.   He was admitted in 12/2023 for a left hip fracture after a mechanical fall. Echo during admission showed LVEF of 30-35% with global hypokinesis and grade 1 diastolic dysfunction, normal RV function with moderately elevated RVSp of 49.0 mmHg, and mild MR. Carotid dopplers were also updated and showed CTO of right ICA and 60-79% stenosis of left ICA as well as retrograde/ turbulent flow of the left vertebral artery and occlusion of the right vertebral artery.   He was last seen by Dr. Lesches later that month at which time he was doing well from a cardiac standpoint. He was referred to the [facility] team for medication optimization. However, he has not been seen by them yet.   Patient presents today for routine follow-up. ***  CAD S/p DES to OM2 and LCX in 2018 and more recently CABG x5 in 01/2020.  - No angina.  - Continue Isordil  5mg  twice  daily and Coreg  3.125mg  twice daily.  - Continue DAPT with Aspirin  and Plavix .  - Continue Lipitor  80mg  daily.   History of VT/VF Cardiac Arrest s/p ICD Patient had VT/VF cardiac arrest a few weeks after STEMI in 05/2016 felt to be ischemic in nature and required additional PCI. Ultimately had ICD placed in 06/2016.  - Followed by Dr. Inocencio.   Ischemic Cardiomyopathy Chronic Combined CHF Last Echo in 12/2023 showed LVEF of 30-35% with  global hypokinesis and grade 1 diastolic dysfunction, normal RV function with moderately elevated RVSp of 49.0 mmHg, and mild MR.  - *** -  *** - Continue Coreg  6.25mg  twice daily.  - Continue Isordil  5mg  twice daily.  - Hydralazine  *** - SGLT2 inhibitor *** cost *** - No Entresto  or Spironolactone  due to renal function and hyperkalemia.  - Discussed importance of daily weight and sodium/fluid restrictions.  Bilateral Carotid Artery Disease S/p right CEA in 2010 with subsequent occlusion. Last carotid dopplers in 12/2023 showed CTO of right ICA and 60-79% stenosis of left ICA as well as retrograde/ turbulent flow of the left vertebral artery and occlusion of the right vertebral artery.  - Continue DAPT and statin. - Plan is for repeat carotid dopplers in 12/2024.   Hypertension BP *** - Will treat in context of CHF as above.  Hyperlipidemia Recent lipid panel on 03/10/2024: Total Cholesterol 120, Triglycerides 136, HDL 33, LDL 63. LDL goal <55.  - Continue Lipitor  80mg  daily. Compliance *** - Zetia ***  Type 2 Diabetes Mellitus Hemoglobin A1c 6.9% on 03/10/2024.  - On Semaglutide  and Insulin .  - Management per PCP.   CKD Stage IIIa Recent creatinine around 1.7 to 2.0.   Hyperkalemia Potassium 5.5 in 12/2023 and 5.7 on most recentl labs on 03/10/2024. *** - Lokelma  ***  EKGs/Labs/Other Studies Reviewed:    The following studies were reviewed:  Right/ Left Cardiac Catheterization 01/24/2020: Severe 3 vessel CAD Patent LM Proximal to mid LAD 60-70% with evidence of hemodynamic significance, DFR = 0.7. Ostial to mid circumflex diffuse ISR 99%. Ostial 95% RCA. LVEDP 33 mmHg, consistent with acute on chronic combined systolic and diastolic HF. Moderate pulmonary hypertension with mean PAP 40 mmHg. WHO Group II  (PVR=2 [place] units; PCWP mean 30 mmHg) Cardiac output 4.9 l/min PA saturation 57%   Recommendation: Aggressive management CHF. May require inotrope and mechanical  support. Once HF controlled, consider CABG.  Diagnostic Dominance: Right  _______________  Carotid Dopplers 12/07/2023: Summary:  - Right Carotid: Evidence consistent with a total occlusion of the right  ICA. The CCA appears occluded. The ECA appears occluded.  - Left Carotid: Velocities in the left ICA are consistent with a 60-79%  stenosis. Non-hemodynamically significant plaque <50% noted in the  CCA. The ECA appears >50% stenosed.  - Vertebrals:  Left vertebral artery demonstrates retrograde/turbulent flow.  Right vertebral artery demonstrates an occlusion.  - Subclavians: Normal flow hemodynamics were seen in bilateral subclavian  arteries.   EKG:  EKG not ordered today.   Recent Labs: 12/05/2023: Magnesium  2.1 12/24/2023: Hemoglobin 11.1; Platelets 428 03/10/2024: ALT 16; BUN 23; Creatinine, Ser 1.74; Potassium 5.7; Sodium 139; TSH 2.530  Recent Lipid Panel    Component Value Date/Time   CHOL 120 03/10/2024 1439   TRIG 136 03/10/2024 1439   HDL 33 (L) 03/10/2024 1439   CHOLHDL 3.6 03/10/2024 1439   CHOLHDL 4.7 05/24/2016 1537   VLDL 28 05/24/2016 1537   LDLCALC 63 03/10/2024 1439    Physical Exam:  Vital Signs: There were no vitals taken for this visit.    Wt Readings from Last 3 Encounters:  03/10/24 194 lb (88 kg)  12/29/23 195 lb 9.6 oz (88.7 kg)  12/24/23 202 lb (91.6 kg)     General: 58 y.o. male in no acute distress. HEENT: Normocephalic and atraumatic. Sclera clear.  Neck: Supple. No carotid bruits. No JVD. Heart: *** RRR. Distinct S1 and S2. No murmurs, gallops, or rubs.  Lungs: No increased work of breathing. Clear to ausculation bilaterally. No wheezes, rhonchi, or rales.  Abdomen: Soft, non-distended, and non-tender to palpation.  Extremities: No lower extremity edema.  Radial and distal pedal pulses 2+ and equal bilaterally. Skin: Warm and dry. Neuro: No focal deficits. Psych: Normal affect. Responds appropriately.   Assessment:    No  diagnosis found.  Plan:     Disposition: Follow up in ***   Signed, Aline FORBES Door, PA-C  03/13/2024 2:30 PM    [facility]

## 2024-03-15 ENCOUNTER — Ambulatory Visit: Payer: Self-pay | Admitting: Cardiology

## 2024-03-17 ENCOUNTER — Ambulatory Visit: Payer: Self-pay | Admitting: Family Medicine

## 2024-03-22 ENCOUNTER — Ambulatory Visit: Payer: Self-pay | Attending: Student | Admitting: Student

## 2024-04-05 ENCOUNTER — Ambulatory Visit: Payer: Medicare PPO

## 2024-05-13 ENCOUNTER — Telehealth: Payer: Self-pay | Admitting: Pharmacist

## 2024-05-13 ENCOUNTER — Telehealth: Payer: Self-pay | Admitting: Family Medicine

## 2024-05-13 DIAGNOSIS — Z7985 Long-term (current) use of injectable non-insulin antidiabetic drugs: Secondary | ICD-10-CM

## 2024-05-13 DIAGNOSIS — E119 Type 2 diabetes mellitus without complications: Secondary | ICD-10-CM

## 2024-05-13 NOTE — Telephone Encounter (Unsigned)
 Copied from CRM #[id number]. Topic: Clinical - Medication Prior Auth >> May 13, 2024  8:14 AM Delon HERO wrote: Reason for CRM: Patient's daughter is calling to report that she picked up Semaglutide ,0.25 or 0.5MG /DOS, 2 MG/3ML SOPN [[id number]]. Medication was over $700. Called insurance advised it was a deduct able afterwards - $150.  Patient's daughter is requesting a PA or a change in medication to something long term cheaper?

## 2024-05-13 NOTE — Telephone Encounter (Signed)
 [id number] placed to Pharmacy for PCP request.  Will assist patient as able.

## 2024-05-13 NOTE — Telephone Encounter (Signed)
 Please refer him to [facility], they might have to try for prescription assistance and may have to titrate to Trulicity is what it seems like.

## 2024-05-16 ENCOUNTER — Other Ambulatory Visit: Payer: Self-pay | Admitting: Family Medicine

## 2024-05-16 ENCOUNTER — Telehealth: Payer: Self-pay

## 2024-05-16 NOTE — Progress Notes (Signed)
 Care Guide Pharmacy Note  05/16/2024 Name: Douglas Carr MRN: [id number] DOB: Nov 28, 1965  Referred By: Maryanne [name], MD Reason for referral: Complex Care Management (Outreach to schedule with Pharm d )   Douglas Carr is a 59 y.o. year old male who is a primary care patient of [name], [name], MD.  Douglas Carr was referred to the pharmacist for assistance related to: DMII  Successful contact was made with the patient to discuss pharmacy services including being ready for the pharmacist to call at least 5 minutes before the scheduled appointment time and to have medication bottles and any blood pressure readings ready for review. The patient agreed to meet with the pharmacist via telephone visit on (date/time).06/03/2024  Douglas Carr , RMA     [place]  [facility], [facility] Guide  Direct Dial: [phone]  Website: .[email or web address]

## 2024-05-16 NOTE — Telephone Encounter (Signed)
 Copied from CRM #[id number]. Topic: Clinical - Medication Refill >> May 16, 2024 12:43 PM Sophia H wrote: Medication: mirtazapine  (REMERON ) 30 MG tablet   Has the patient contacted their pharmacy? Yes, states pharmacy did request with no response.   This is the patient's preferred pharmacy:  [id number] - [place], [place] - [address] [place] [address] Phone: [phone] Fax: [phone]  Is this the correct pharmacy for this prescription? Yes If no, delete pharmacy and type the correct one.   Has the prescription been filled recently? Had been getting filled by an old provider, needing PCP to pick up.   Is the patient out of the medication? Yes  Has the patient been seen for an appointment in the last year OR does the patient have an upcoming appointment? Yes, patient has an appt in Feb.   Can we respond through MyChart? No, prefers phone call.   Agent: Please be advised that Rx refills may take up to 3 business days. We ask that you follow-up with your pharmacy.

## 2024-05-18 MED ORDER — MIRTAZAPINE 30 MG PO TABS: 30.0000 mg | ORAL_TABLET | Freq: Every day | ORAL | 1 refills | Status: AC

## 2024-05-19 ENCOUNTER — Encounter: Payer: Self-pay | Admitting: Emergency Medicine

## 2024-06-03 ENCOUNTER — Ambulatory Visit: Admitting: Family Medicine

## 2024-06-03 ENCOUNTER — Encounter: Payer: Self-pay | Admitting: Family Medicine

## 2024-06-03 ENCOUNTER — Other Ambulatory Visit

## 2024-06-03 VITALS — BP 169/88 | HR 83 | Ht 67.0 in | Wt 202.0 lb

## 2024-06-03 DIAGNOSIS — E119 Type 2 diabetes mellitus without complications: Secondary | ICD-10-CM | POA: Diagnosis not present

## 2024-06-03 DIAGNOSIS — E669 Obesity, unspecified: Secondary | ICD-10-CM

## 2024-06-03 DIAGNOSIS — E1169 Type 2 diabetes mellitus with other specified complication: Secondary | ICD-10-CM

## 2024-06-03 DIAGNOSIS — E1159 Type 2 diabetes mellitus with other circulatory complications: Secondary | ICD-10-CM

## 2024-06-03 DIAGNOSIS — E118 Type 2 diabetes mellitus with unspecified complications: Secondary | ICD-10-CM

## 2024-06-03 DIAGNOSIS — Z794 Long term (current) use of insulin: Secondary | ICD-10-CM | POA: Diagnosis not present

## 2024-06-03 DIAGNOSIS — E782 Mixed hyperlipidemia: Secondary | ICD-10-CM

## 2024-06-03 DIAGNOSIS — E785 Hyperlipidemia, unspecified: Secondary | ICD-10-CM | POA: Diagnosis not present

## 2024-06-03 DIAGNOSIS — N183 Chronic kidney disease, stage 3 unspecified: Secondary | ICD-10-CM

## 2024-06-03 DIAGNOSIS — I25119 Atherosclerotic heart disease of native coronary artery with unspecified angina pectoris: Secondary | ICD-10-CM

## 2024-06-03 DIAGNOSIS — I152 Hypertension secondary to endocrine disorders: Secondary | ICD-10-CM | POA: Diagnosis not present

## 2024-06-03 LAB — BAYER DCA HB A1C WAIVED: HB A1C (BAYER DCA - WAIVED): 7.3 % — ABNORMAL HIGH (ref 4.8–5.6)

## 2024-06-03 MED ORDER — FREESTYLE LIBRE 3 PLUS SENSOR MISC: 3 refills | Status: AC

## 2024-06-03 MED ORDER — FREESTYLE LIBRE 3 PLUS SENSOR MISC: 3 refills | Status: DC

## 2024-06-03 MED ORDER — LOSARTAN POTASSIUM 25 MG PO TABS: 25.0000 mg | ORAL_TABLET | Freq: Every day | ORAL | 1 refills | Status: AC

## 2024-06-03 MED ORDER — SEMAGLUTIDE (2 MG/DOSE) 8 MG/3ML ~~LOC~~ SOPN: 2.0000 mg | PEN_INJECTOR | SUBCUTANEOUS | 2 refills | Status: AC

## 2024-06-03 MED ORDER — ACCU-CHEK GUIDE TEST VI STRP: ORAL_STRIP | 12 refills | Status: AC

## 2024-06-03 NOTE — Progress Notes (Unsigned)
 GAVE 2MG  OZEMPIC  PEN INSTRUCTIONS GIVEN FOR MICROCLICKS GAVBE LIBRE SAMPLES DAUGHTER WITH HIM A1C DOWN TO 7.3%   06/05/2024 Name: Douglas Carr MRN: [id number] DOB: 1965-07-25  Chief Complaint  Patient presents with   Diabetes    Douglas Carr is a 59 y.o. year old male who presented for a telephone visit.   They were referred to the pharmacist by their PCP for assistance in managing diabetes and medication access.    Subjective: Patient presents today to received a CGM sample and placement. He is doing well with the Ozempic . No side effects reported. He is taking 12 units of basaglar  with no issues.   Care Team: Primary Care Provider: [name], [name], MD; next visit: 03/10/2024  Medication Access/Adherence  Current Pharmacy:  [id number] - [place], [place] - [address] [place] [address] Phone: [phone] Fax: [phone]  Patient reports affordability concerns with their medications: No  Patient reports access/transportation concerns to their pharmacy: No  Patient reports adherence concerns with their medications:  No    Diabetes: Current medications: Ozempic  0.25 mg weekly (every Friday), Basaglar  12 units daily Medications tried in the past: Novolog  Statin: atorvastatin  80 mg daily LDL of 259 on 12/03/23, will likely benefit from additional lipid therapy (PCSK9i) ACEi/ARB: N/A, previously on Entresto  in 2023 UACR of 4,830 on 12/03/23, will likely benefit from a RAAS agent GFR 34 mL/min  A1c of 13.4% on 12/05/23, increased from 10.5% on 05/26/2021  Current glucose readings:  Fasting sugars: 100-120 mg/dL   Patient denies hypoglycemic s/sx including dizziness, shakiness, sweating. Patient denies  hyperglycemic symptoms including polyuria, polydipsia, polyphagia, nocturia, neuropathy, blurred vision.  Current meal patterns:  Discussed meal planning options and plate method for healthy eating Avoid sugary drinks and  desserts Incorporate balanced protein, non starchy veggies, 1 serving of carbohydrate with each meal Increase water  intake Increase physical activity as able  Current medication access support: Medicaid/Medicare  Objective: Lab Results  Component Value Date   HGBA1C 7.3 (H) 06/03/2024   Lab Results  Component Value Date   CREATININE 2.29 (H) 06/03/2024   BUN 29 (H) 06/03/2024   NA 141 06/03/2024   K 5.1 06/03/2024   CL 105 06/03/2024   CO2 21 06/03/2024   Lab Results  Component Value Date   CHOL 142 06/03/2024   HDL 29 (L) 06/03/2024   LDLCALC 81 06/03/2024   TRIG 184 (H) 06/03/2024   CHOLHDL 4.9 06/03/2024   Medications Reviewed Today   Medications were not reviewed in this encounter     Assessment/Plan:   Diabetes: Currently uncontrolled,  Goal of <7% Reviewed long term cardiovascular and renal outcomes of uncontrolled blood sugar Reviewed goal A1c, goal fasting, and goal 2 hour post prandial glucose Recommend to   Increase Ozempic  to 0.5 mg weekly on Friday (01/22/24) Completes 4 weeks of 0.25 mg on 09/12, no side effects reported.  Sent in new Rx for Ozempic  0.5 mg weekly to pharmacy Continue Basaglar  12 units daily Place Libre 3+ sensor and set up [employer] and 1 month supply of [place] sensors. Sent in new Rx for Libre 3+ sensors Patient denies personal or family history of multiple endocrine neoplasia type 2, medullary thyroid cancer; personal history of pancreatitis or gallbladder disease. Recommend to check glucose continuously via Libre 3+ CGM  Hyperlipidemia: Currently uncontrolled LDL goal per cardiology <70, can consider lower goal of <55 considering ASCVD history  Patient filled atorvastatin  on 12/24/23 LDL is 259, will likely need  PCSK9 given LDL significantly above goal  Immunization: hg Patient due for influenza vaccination Administered influenza vaccine into the left deltoid  Follow Up Plan: PharmD: 3 weeks PCP: 03/10/2024  Woodie Jock, PharmD PGY1 Pharmacy Resident   [facility] Tarry Griffin, PharmD, BCACP, CPP Clinical Pharmacist, [facility]   0

## 2024-06-03 NOTE — Progress Notes (Signed)
 "  BP (!) 169/88   Pulse 83   Ht 5' 7 (1.702 m)   Wt 202 lb (91.6 kg)   SpO2 100%   BMI 31.64 kg/m    Subjective:   Patient ID: Douglas Carr, male    DOB: 1965-07-28, 59 y.o.   MRN: [id number]  HPI: Douglas Carr is a 59 y.o. male presenting on 06/03/2024 for Medical Management of Chronic Issues, Diabetes, and Hypertension   Discussed the use of AI scribe software for clinical note transcription with the patient, who gave verbal consent to proceed.  History of Present Illness   Douglas Carr is a 59 year old male with diabetes who presents for management of blood sugar levels and medication refills.  Hyperglycemia and diabetes management - Elevated postprandial blood glucose, most recent reading 280 mg/dL - Fasting blood glucose in the low hundreds - Difficulty monitoring glucose due to running out of test strips and sensors - Currently using Basaglar  insulin , 12 units nightly - Issues obtaining Ozempic  and test strips - Potential switch from Ozempic  to Trulicity due to insurance coverage  Hypertension management - History of hypertension - Elevated blood pressure during visit - No home blood pressure monitoring due to lack of blood pressure machine - Blood pressure previously better controlled during home physical therapy  Medication adherence and access - On atorvastatin  for cholesterol management - Recently started losartan , prescribed by nephrologist - Uncertain adherence to losartan  as it is new to regimen  Renal and general symptoms - No kidney pain - No swallowing difficulties          Relevant past medical, surgical, family and social history reviewed and updated as indicated. Interim medical history since our last visit reviewed. Allergies and medications reviewed and updated.  Review of Systems  Constitutional:  Negative for chills and fever.  Eyes:  Negative for visual disturbance.  Respiratory:  Negative for shortness of breath and  wheezing.   Cardiovascular:  Negative for chest pain and leg swelling.  Musculoskeletal:  Negative for back pain and gait problem.  Skin:  Negative for rash.  All other systems reviewed and are negative.   Per HPI unless specifically indicated above   Allergies as of 06/03/2024   No Known Allergies      Medication List        Accurate as of June 03, 2024  2:24 PM. If you have any questions, ask your nurse or doctor.          Accu-Chek Guide Test test strip Generic drug: glucose blood Use as instructed to check blood sugar 3 times daily; patient on insulin ; DX:11.65   Accu-Chek Guide w/Device Kit Use as directed to check glucose. DX: E11.65   Accu-Chek Softclix Lancets lancets Use as directed to check glucose 3x/day. DX:11.65   aspirin  EC 81 MG tablet Commonly known as: Aspirin  Low Dose Take 1 tablet (81 mg total) by mouth daily. Swallow whole.   atorvastatin  80 MG tablet Commonly known as: LIPITOR  Take 1 tablet (80 mg total) by mouth every evening.   Basaglar  KwikPen 100 UNIT/ML Inject 12 Units into the skin at bedtime. Inject up to 50 units subcutaneously daily per provider guidance   carvedilol  3.125 MG tablet Commonly known as: COREG  Take 1 tablet (3.125 mg total) by mouth every 12 (twelve) hours.   clopidogrel  75 MG tablet Commonly known as: PLAVIX  Take 1 tablet (75 mg total) by mouth every evening.   FreeStyle Libre 3 Plus Sensor Misc Change  sensor every 15 days. DX: E11.9   isosorbide  dinitrate 5 MG tablet Commonly known as: ISORDIL  Take 1 tablet (5 mg total) by mouth 2 (two) times daily. Please contact the office to schedule appointment for additional refills. 1st Attempt.   losartan  25 MG tablet Commonly known as: COZAAR  Take 1 tablet (25 mg total) by mouth daily. Started by: Fonda Levins, MD   mirtazapine  30 MG tablet Commonly known as: REMERON  Take 1 tablet (30 mg total) by mouth at bedtime.   multivitamin with minerals Tabs  tablet Take 1 tablet by mouth daily.   polyethylene glycol 17 g packet Commonly known as: MIRALAX  / GLYCOLAX  Take 17 g by mouth 2 (two) times daily.   Semaglutide (0.25 or 0.5MG /DOS) 2 MG/3ML Sopn Inject 0.5 mg into the skin once a week.         Objective:   BP (!) 169/88   Pulse 83   Ht 5' 7 (1.702 m)   Wt 202 lb (91.6 kg)   SpO2 100%   BMI 31.64 kg/m   Wt Readings from Last 3 Encounters:  06/03/24 202 lb (91.6 kg)  03/10/24 194 lb (88 kg)  12/29/23 195 lb 9.6 oz (88.7 kg)    Physical Exam Vitals and nursing note reviewed.  Constitutional:      Appearance: Normal appearance.  Neurological:     Mental Status: He is alert.    Physical Exam   CHEST: Lungs clear to auscultation. CARDIOVASCULAR: Heart sounds normal.         Assessment & Plan:   Problem List Items Addressed This Visit       Cardiovascular and Mediastinum   Hypertension associated with diabetes (HCC)   Relevant Medications   losartan  (COZAAR ) 25 MG tablet   Other Relevant Orders   Bayer DCA Hb A1c Waived   CBC with Differential/Platelet   Lipid panel   CMP14+EGFR     Endocrine   Type 2 diabetes mellitus in patient with obesity (HCC) - Primary   Relevant Medications   losartan  (COZAAR ) 25 MG tablet   Continuous Glucose Sensor (FREESTYLE LIBRE 3 PLUS SENSOR) MISC   glucose blood (ACCU-CHEK GUIDE TEST) test strip   Type 2 diabetes mellitus with other specified complication (HCC)   Relevant Medications   losartan  (COZAAR ) 25 MG tablet   Continuous Glucose Sensor (FREESTYLE LIBRE 3 PLUS SENSOR) MISC   glucose blood (ACCU-CHEK GUIDE TEST) test strip     Genitourinary   CKD (chronic kidney disease), stage III (HCC)   Relevant Medications   losartan  (COZAAR ) 25 MG tablet   Other Relevant Orders   Bayer DCA Hb A1c Waived   CBC with Differential/Platelet   Lipid panel   CMP14+EGFR     Other   Dyslipidemia, goal LDL below 70   Relevant Medications   losartan  (COZAAR ) 25 MG tablet    Obesity, morbid (HCC)   Other Visit Diagnoses       Mixed hyperlipidemia       Relevant Medications   losartan  (COZAAR ) 25 MG tablet   Other Relevant Orders   Bayer DCA Hb A1c Waived   CBC with Differential/Platelet   Lipid panel   CMP14+EGFR     Diabetes mellitus with complication (HCC)       Relevant Medications   losartan  (COZAAR ) 25 MG tablet   Continuous Glucose Sensor (FREESTYLE LIBRE 3 PLUS SENSOR) MISC   glucose blood (ACCU-CHEK GUIDE TEST) test strip         Type 2 diabetes  mellitus Elevated postprandial glucose levels. Current regimen includes Basaglar  insulin  12 units at night. Potential switch from Ozempic  to Trulicity due to better coverage. - Sent prescriptions for test strips and sensors. - Coordinated with Mliss for potential switch from Ozempic  to [employer]. - Advised on dietary modifications to reduce carbohydrate intake, focusing on proteins, fish, turkey, eggs, dairy, fruits, and vegetables. - Encouraged reduction of bread, potatoes, pasta, rice, grains, and cereals.  Hypertension Elevated readings during office visits. No recent home monitoring due to lack of equipment. - Recommended obtaining a home blood pressure monitor for regular monitoring.  Stage 3 chronic kidney disease Managed with losartan , likely prescribed by nephrologist for renal protection. - Verified losartan  prescription and adherence status.  Mixed hyperlipidemia Managed with atorvastatin  (Lipitor ). - Continue atorvastatin  (Lipitor ) for cholesterol management.     A1c is 7.3 which is up a little bit from last time but still not horrible, no medication changes today course he needs changed to Trulicity for insurance purposes.     Follow up plan: Return in about 3 months (around 09/01/2024), or if symptoms worsen or fail to improve, for Diabetes recheck.  Counseling provided for all of the vaccine components Orders Placed This Encounter  Procedures   Bayer DCA Hb A1c Waived   CBC  with Differential/Platelet   Lipid panel   CMP14+EGFR    Fonda Levins, MD [facility] 06/03/2024, 2:24 PM     "

## 2024-06-04 LAB — CBC WITH DIFFERENTIAL/PLATELET
Basophils Absolute: 0.1 10*3/uL (ref 0.0–0.2)
Basos: 1 %
EOS (ABSOLUTE): 0.2 10*3/uL (ref 0.0–0.4)
Eos: 2 %
Hematocrit: 42.4 % (ref 37.5–51.0)
Hemoglobin: 14.1 g/dL (ref 13.0–17.7)
Immature Grans (Abs): 0.1 10*3/uL (ref 0.0–0.1)
Immature Granulocytes: 1 %
Lymphocytes Absolute: 2.6 10*3/uL (ref 0.7–3.1)
Lymphs: 28 %
MCH: 27.5 pg (ref 26.6–33.0)
MCHC: 33.3 g/dL (ref 31.5–35.7)
MCV: 83 fL (ref 79–97)
Monocytes Absolute: 0.6 10*3/uL (ref 0.1–0.9)
Monocytes: 7 %
Neutrophils Absolute: 5.8 10*3/uL (ref 1.4–7.0)
Neutrophils: 61 %
Platelets: 222 10*3/uL (ref 150–450)
RBC: 5.12 x10E6/uL (ref 4.14–5.80)
RDW: 13.7 % (ref 11.6–15.4)
WBC: 9.4 10*3/uL (ref 3.4–10.8)

## 2024-06-04 LAB — CMP14+EGFR
ALT: 14 [IU]/L (ref 0–44)
AST: 14 [IU]/L (ref 0–40)
Albumin: 4.1 g/dL (ref 3.8–4.9)
Alkaline Phosphatase: 136 [IU]/L — ABNORMAL HIGH (ref 47–123)
BUN/Creatinine Ratio: 13 (ref 9–20)
BUN: 29 mg/dL — ABNORMAL HIGH (ref 6–24)
Bilirubin Total: 0.3 mg/dL (ref 0.0–1.2)
CO2: 21 mmol/L (ref 20–29)
Calcium: 9.4 mg/dL (ref 8.7–10.2)
Chloride: 105 mmol/L (ref 96–106)
Creatinine, Ser: 2.29 mg/dL — ABNORMAL HIGH (ref 0.76–1.27)
Globulin, Total: 3.2 g/dL (ref 1.5–4.5)
Glucose: 132 mg/dL — ABNORMAL HIGH (ref 70–99)
Potassium: 5.1 mmol/L (ref 3.5–5.2)
Sodium: 141 mmol/L (ref 134–144)
Total Protein: 7.3 g/dL (ref 6.0–8.5)
eGFR: 32 mL/min/{1.73_m2} — ABNORMAL LOW

## 2024-06-04 LAB — LIPID PANEL
Chol/HDL Ratio: 4.9 ratio (ref 0.0–5.0)
Cholesterol, Total: 142 mg/dL (ref 100–199)
HDL: 29 mg/dL — ABNORMAL LOW
LDL Chol Calc (NIH): 81 mg/dL (ref 0–99)
Triglycerides: 184 mg/dL — ABNORMAL HIGH (ref 0–149)
VLDL Cholesterol Cal: 32 mg/dL (ref 5–40)

## 2024-06-06 ENCOUNTER — Ambulatory Visit: Payer: Self-pay | Admitting: Family Medicine

## 2024-06-10 ENCOUNTER — Ambulatory Visit: Payer: Self-pay | Admitting: Family Medicine

## 2024-06-10 ENCOUNTER — Telehealth: Payer: Self-pay | Admitting: Pharmacist

## 2024-06-10 NOTE — Telephone Encounter (Signed)
 Patient on daily basal insulin  Needs libre 3 PLUS CGM Per patient, pharmacy said not covered so I'm guessing PA needed

## 2024-07-05 ENCOUNTER — Ambulatory Visit: Payer: Medicare PPO

## 2024-09-05 ENCOUNTER — Ambulatory Visit: Admitting: Family Medicine
